# Patient Record
Sex: Male | Born: 1935
Health system: Southern US, Community
[De-identification: ages and names within clinical notes are randomized; demographics above are authoritative.]

## PROBLEM LIST (undated history)

## (undated) DIAGNOSIS — K219 Gastro-esophageal reflux disease without esophagitis: Secondary | ICD-10-CM

## (undated) DIAGNOSIS — C189 Malignant neoplasm of colon, unspecified: Secondary | ICD-10-CM

## (undated) DIAGNOSIS — C61 Malignant neoplasm of prostate: Secondary | ICD-10-CM

## (undated) DIAGNOSIS — E538 Deficiency of other specified B group vitamins: Secondary | ICD-10-CM

## (undated) DIAGNOSIS — I739 Peripheral vascular disease, unspecified: Secondary | ICD-10-CM

## (undated) DIAGNOSIS — I10 Essential (primary) hypertension: Secondary | ICD-10-CM

## (undated) DIAGNOSIS — E119 Type 2 diabetes mellitus without complications: Secondary | ICD-10-CM

## (undated) DIAGNOSIS — G4733 Obstructive sleep apnea (adult) (pediatric): Secondary | ICD-10-CM

## (undated) DIAGNOSIS — I2510 Atherosclerotic heart disease of native coronary artery without angina pectoris: Secondary | ICD-10-CM

## (undated) DIAGNOSIS — E782 Mixed hyperlipidemia: Secondary | ICD-10-CM

## (undated) HISTORY — PX: PROSTATECTOMY: SHX69

## (undated) HISTORY — DX: Essential (primary) hypertension: I10

## (undated) HISTORY — PX: VASECTOMY: SHX75

## (undated) HISTORY — PX: OTHER SURGICAL HISTORY: SHX169

## (undated) HISTORY — PX: TONSILLECTOMY: SHX5217

## (undated) HISTORY — PX: KNEE ARTHROSCOPY: SUR90

## (undated) HISTORY — PX: SHOULDER SURGERY: SHX246

## (undated) HISTORY — DX: Obstructive sleep apnea (adult) (pediatric): G47.33

## (undated) HISTORY — DX: Peripheral vascular disease, unspecified: I73.9

## (undated) HISTORY — DX: Mixed hyperlipidemia: E78.2

## (undated) HISTORY — PX: BACK SURGERY: SHX140

## (undated) HISTORY — DX: Atherosclerotic heart disease of native coronary artery without angina pectoris: I25.10

## (undated) HISTORY — PX: COLON SURGERY: SHX602

## (undated) HISTORY — DX: Type 2 diabetes mellitus without complications: E11.9

## (undated) HISTORY — DX: Malignant neoplasm of prostate: C61

## (undated) HISTORY — DX: Deficiency of other specified B group vitamins: E53.8

## (undated) HISTORY — PX: KNEE SURGERY: SHX244

---

## 1998-04-26 ENCOUNTER — Inpatient Hospital Stay (HOSPITAL_COMMUNITY): Admission: AD | Admit: 1998-04-26 | Discharge: 1998-04-29 | Payer: Self-pay | Admitting: Cardiology

## 1998-04-29 ENCOUNTER — Ambulatory Visit (HOSPITAL_COMMUNITY): Admission: RE | Admit: 1998-04-29 | Discharge: 1998-04-29 | Payer: Self-pay | Admitting: Neurosurgery

## 1998-06-20 ENCOUNTER — Ambulatory Visit (HOSPITAL_COMMUNITY): Admission: RE | Admit: 1998-06-20 | Discharge: 1998-06-20 | Payer: Self-pay | Admitting: Neurosurgery

## 1998-11-01 ENCOUNTER — Inpatient Hospital Stay (HOSPITAL_COMMUNITY): Admission: RE | Admit: 1998-11-01 | Discharge: 1998-11-02 | Payer: Self-pay | Admitting: Neurosurgery

## 2000-04-05 ENCOUNTER — Ambulatory Visit (HOSPITAL_COMMUNITY): Admission: RE | Admit: 2000-04-05 | Discharge: 2000-04-05 | Payer: Self-pay

## 2000-04-21 ENCOUNTER — Ambulatory Visit (HOSPITAL_COMMUNITY): Admission: RE | Admit: 2000-04-21 | Discharge: 2000-04-21 | Payer: Self-pay | Admitting: Oral Surgery

## 2001-03-19 ENCOUNTER — Encounter: Payer: Self-pay | Admitting: Family Medicine

## 2001-03-19 ENCOUNTER — Ambulatory Visit (HOSPITAL_COMMUNITY): Admission: RE | Admit: 2001-03-19 | Discharge: 2001-03-19 | Payer: Self-pay | Admitting: Family Medicine

## 2002-06-28 ENCOUNTER — Encounter: Payer: Self-pay | Admitting: Family Medicine

## 2002-06-28 ENCOUNTER — Ambulatory Visit (HOSPITAL_COMMUNITY): Admission: RE | Admit: 2002-06-28 | Discharge: 2002-06-28 | Payer: Self-pay | Admitting: Family Medicine

## 2002-07-02 ENCOUNTER — Ambulatory Visit (HOSPITAL_COMMUNITY): Admission: RE | Admit: 2002-07-02 | Discharge: 2002-07-02 | Payer: Self-pay | Admitting: Family Medicine

## 2002-07-02 ENCOUNTER — Encounter: Payer: Self-pay | Admitting: Family Medicine

## 2002-10-19 ENCOUNTER — Encounter: Payer: Self-pay | Admitting: Orthopedic Surgery

## 2002-10-25 ENCOUNTER — Inpatient Hospital Stay (HOSPITAL_COMMUNITY): Admission: RE | Admit: 2002-10-25 | Discharge: 2002-11-01 | Payer: Self-pay | Admitting: Orthopedic Surgery

## 2002-11-29 ENCOUNTER — Encounter (HOSPITAL_COMMUNITY): Admission: RE | Admit: 2002-11-29 | Discharge: 2002-12-29 | Payer: Self-pay | Admitting: Orthopedic Surgery

## 2002-12-31 ENCOUNTER — Encounter (HOSPITAL_COMMUNITY): Admission: RE | Admit: 2002-12-31 | Discharge: 2003-01-21 | Payer: Self-pay | Admitting: Orthopedic Surgery

## 2003-01-24 ENCOUNTER — Encounter (HOSPITAL_COMMUNITY): Admission: RE | Admit: 2003-01-24 | Discharge: 2003-02-23 | Payer: Self-pay | Admitting: Orthopedic Surgery

## 2003-03-31 ENCOUNTER — Ambulatory Visit (HOSPITAL_COMMUNITY): Admission: RE | Admit: 2003-03-31 | Discharge: 2003-03-31 | Payer: Self-pay | Admitting: Family Medicine

## 2003-03-31 ENCOUNTER — Encounter: Payer: Self-pay | Admitting: Family Medicine

## 2003-04-20 ENCOUNTER — Encounter: Payer: Self-pay | Admitting: Urology

## 2003-04-20 ENCOUNTER — Ambulatory Visit (HOSPITAL_COMMUNITY): Admission: RE | Admit: 2003-04-20 | Discharge: 2003-04-20 | Payer: Self-pay | Admitting: Internal Medicine

## 2003-06-13 ENCOUNTER — Ambulatory Visit (HOSPITAL_COMMUNITY): Admission: RE | Admit: 2003-06-13 | Discharge: 2003-06-13 | Payer: Self-pay | Admitting: Neurosurgery

## 2003-06-15 ENCOUNTER — Ambulatory Visit (HOSPITAL_COMMUNITY): Admission: RE | Admit: 2003-06-15 | Discharge: 2003-06-15 | Payer: Self-pay | Admitting: Neurosurgery

## 2003-07-28 ENCOUNTER — Ambulatory Visit (HOSPITAL_COMMUNITY): Admission: RE | Admit: 2003-07-28 | Discharge: 2003-07-28 | Payer: Self-pay | Admitting: Internal Medicine

## 2003-10-31 ENCOUNTER — Encounter: Admission: RE | Admit: 2003-10-31 | Discharge: 2003-10-31 | Payer: Self-pay | Admitting: Orthopedic Surgery

## 2003-11-02 ENCOUNTER — Ambulatory Visit (HOSPITAL_BASED_OUTPATIENT_CLINIC_OR_DEPARTMENT_OTHER): Admission: RE | Admit: 2003-11-02 | Discharge: 2003-11-02 | Payer: Self-pay | Admitting: Orthopedic Surgery

## 2003-11-02 ENCOUNTER — Ambulatory Visit (HOSPITAL_COMMUNITY): Admission: RE | Admit: 2003-11-02 | Discharge: 2003-11-02 | Payer: Self-pay | Admitting: Orthopedic Surgery

## 2003-11-23 ENCOUNTER — Encounter: Admission: RE | Admit: 2003-11-23 | Discharge: 2003-11-23 | Payer: Self-pay | Admitting: Orthopedic Surgery

## 2003-12-01 ENCOUNTER — Encounter (HOSPITAL_COMMUNITY): Admission: RE | Admit: 2003-12-01 | Discharge: 2003-12-31 | Payer: Self-pay | Admitting: Orthopedic Surgery

## 2004-01-03 ENCOUNTER — Encounter (HOSPITAL_COMMUNITY): Admission: RE | Admit: 2004-01-03 | Discharge: 2004-02-02 | Payer: Self-pay | Admitting: Orthopedic Surgery

## 2004-01-17 ENCOUNTER — Ambulatory Visit (HOSPITAL_COMMUNITY): Admission: RE | Admit: 2004-01-17 | Discharge: 2004-01-17 | Payer: Self-pay | Admitting: Family Medicine

## 2004-12-18 ENCOUNTER — Ambulatory Visit (HOSPITAL_COMMUNITY): Admission: RE | Admit: 2004-12-18 | Discharge: 2004-12-18 | Payer: Self-pay | Admitting: Otolaryngology

## 2005-03-21 ENCOUNTER — Encounter: Admission: RE | Admit: 2005-03-21 | Discharge: 2005-03-21 | Payer: Self-pay | Admitting: Orthopedic Surgery

## 2005-04-02 ENCOUNTER — Encounter: Admission: RE | Admit: 2005-04-02 | Discharge: 2005-04-02 | Payer: Self-pay | Admitting: Orthopedic Surgery

## 2005-04-03 ENCOUNTER — Ambulatory Visit (HOSPITAL_BASED_OUTPATIENT_CLINIC_OR_DEPARTMENT_OTHER): Admission: RE | Admit: 2005-04-03 | Discharge: 2005-04-03 | Payer: Self-pay | Admitting: Orthopedic Surgery

## 2005-04-03 ENCOUNTER — Ambulatory Visit (HOSPITAL_COMMUNITY): Admission: RE | Admit: 2005-04-03 | Discharge: 2005-04-03 | Payer: Self-pay | Admitting: Orthopedic Surgery

## 2005-04-11 ENCOUNTER — Encounter (HOSPITAL_COMMUNITY): Admission: RE | Admit: 2005-04-11 | Discharge: 2005-05-16 | Payer: Self-pay | Admitting: Orthopedic Surgery

## 2005-05-16 ENCOUNTER — Ambulatory Visit: Payer: Self-pay | Admitting: Internal Medicine

## 2005-05-22 ENCOUNTER — Ambulatory Visit: Payer: Self-pay | Admitting: Internal Medicine

## 2005-07-05 ENCOUNTER — Ambulatory Visit: Payer: Self-pay | Admitting: Internal Medicine

## 2005-10-23 ENCOUNTER — Ambulatory Visit: Payer: Self-pay | Admitting: Internal Medicine

## 2005-11-13 ENCOUNTER — Ambulatory Visit (HOSPITAL_COMMUNITY): Admission: RE | Admit: 2005-11-13 | Discharge: 2005-11-13 | Payer: Self-pay | Admitting: Internal Medicine

## 2005-11-13 ENCOUNTER — Ambulatory Visit: Payer: Self-pay | Admitting: Internal Medicine

## 2006-01-20 ENCOUNTER — Ambulatory Visit: Payer: Self-pay | Admitting: Internal Medicine

## 2006-03-13 ENCOUNTER — Ambulatory Visit: Payer: Self-pay | Admitting: Internal Medicine

## 2006-04-08 ENCOUNTER — Ambulatory Visit (HOSPITAL_COMMUNITY): Admission: RE | Admit: 2006-04-08 | Discharge: 2006-04-08 | Payer: Self-pay | Admitting: Urology

## 2006-06-27 ENCOUNTER — Encounter: Admission: RE | Admit: 2006-06-27 | Discharge: 2006-06-27 | Payer: Self-pay | Admitting: Otolaryngology

## 2006-07-09 ENCOUNTER — Ambulatory Visit: Payer: Self-pay | Admitting: Internal Medicine

## 2006-10-01 ENCOUNTER — Ambulatory Visit: Payer: Self-pay | Admitting: Internal Medicine

## 2006-10-17 ENCOUNTER — Ambulatory Visit: Payer: Self-pay | Admitting: Internal Medicine

## 2006-11-13 ENCOUNTER — Ambulatory Visit: Payer: Self-pay | Admitting: Cardiology

## 2006-11-20 ENCOUNTER — Ambulatory Visit: Payer: Self-pay

## 2006-11-20 LAB — CONVERTED CEMR LAB
AST: 19 units/L (ref 0–37)
Albumin: 3.9 g/dL (ref 3.5–5.2)
HDL: 48.9 mg/dL (ref >39.0)
Total Bilirubin: 0.8 mg/dL (ref 0.3–1.2)
Triglycerides: 98 mg/dL (ref 0–149)

## 2006-12-11 ENCOUNTER — Encounter: Payer: Self-pay | Admitting: Cardiology

## 2006-12-11 ENCOUNTER — Ambulatory Visit: Payer: Self-pay | Admitting: Cardiology

## 2006-12-11 ENCOUNTER — Ambulatory Visit: Payer: Self-pay

## 2006-12-11 LAB — CONVERTED CEMR LAB
Basophils Absolute: 0 10*3/uL (ref 0.0–0.1)
Eosinophils Absolute: 0.1 10*3/uL (ref 0.0–0.6)
Eosinophils Relative: 2.5 % (ref 0.0–5.0)
MCV: 90.4 fL (ref 78.0–100.0)
Platelets: 145 10*3/uL — ABNORMAL LOW (ref 150–400)
RBC: 4.52 M/uL (ref 4.22–5.81)
WBC: 5.4 10*3/uL (ref 4.5–10.5)

## 2006-12-25 ENCOUNTER — Ambulatory Visit: Payer: Self-pay | Admitting: Cardiology

## 2006-12-25 LAB — CONVERTED CEMR LAB
BUN: 24 mg/dL — ABNORMAL HIGH (ref 6–23)
Basophils Absolute: 0 10*3/uL (ref 0.0–0.1)
Eosinophils Absolute: 0.1 10*3/uL (ref 0.0–0.6)
GFR calc Af Amer: 95 mL/min
GFR calc non Af Amer: 79 mL/min
HCT: 43.2 % (ref 39.0–52.0)
Hemoglobin: 14.6 g/dL (ref 13.0–17.0)
INR: 1 (ref 0.9–2.0)
MCHC: 33.9 g/dL (ref 30.0–36.0)
MCV: 90 fL (ref 78.0–100.0)
Monocytes Absolute: 0.5 10*3/uL (ref 0.2–0.7)
Monocytes Relative: 6.5 % (ref 3.0–11.0)
Neutro Abs: 4 10*3/uL (ref 1.4–7.7)
Neutrophils Relative %: 54.7 % (ref 43.0–77.0)
Potassium: 4.6 meq/L (ref 3.5–5.1)
Sodium: 140 meq/L (ref 135–145)
aPTT: 32.7 s (ref 26.5–36.5)

## 2006-12-31 ENCOUNTER — Inpatient Hospital Stay (HOSPITAL_BASED_OUTPATIENT_CLINIC_OR_DEPARTMENT_OTHER): Admission: RE | Admit: 2006-12-31 | Discharge: 2006-12-31 | Payer: Self-pay | Admitting: Cardiology

## 2006-12-31 ENCOUNTER — Ambulatory Visit: Payer: Self-pay | Admitting: Cardiology

## 2007-01-01 ENCOUNTER — Ambulatory Visit: Payer: Self-pay | Admitting: Internal Medicine

## 2007-01-19 ENCOUNTER — Ambulatory Visit: Payer: Self-pay | Admitting: Cardiology

## 2007-02-06 ENCOUNTER — Ambulatory Visit: Payer: Self-pay | Admitting: Cardiology

## 2007-02-06 ENCOUNTER — Ambulatory Visit: Payer: Self-pay

## 2007-02-06 LAB — CONVERTED CEMR LAB
BUN: 14 mg/dL (ref 6–23)
Creatinine, Ser: 0.9 mg/dL (ref 0.4–1.5)
GFR calc Af Amer: 108 mL/min
GFR calc non Af Amer: 89 mL/min
Potassium: 4.1 meq/L (ref 3.5–5.1)
Sodium: 144 meq/L (ref 135–145)

## 2007-03-10 ENCOUNTER — Ambulatory Visit: Payer: Self-pay | Admitting: Cardiology

## 2007-03-10 LAB — CONVERTED CEMR LAB
BUN: 19 mg/dL (ref 6–23)
CO2: 29 meq/L (ref 19–32)
GFR calc Af Amer: 123 mL/min
GFR calc non Af Amer: 102 mL/min
Potassium: 4.6 meq/L (ref 3.5–5.1)

## 2007-06-27 ENCOUNTER — Ambulatory Visit (HOSPITAL_COMMUNITY): Admission: RE | Admit: 2007-06-27 | Discharge: 2007-06-27 | Payer: Self-pay | Admitting: Urology

## 2007-08-24 ENCOUNTER — Ambulatory Visit: Payer: Self-pay | Admitting: Cardiology

## 2007-08-24 LAB — CONVERTED CEMR LAB
Calcium: 9.5 mg/dL (ref 8.4–10.5)
GFR calc Af Amer: 85 mL/min
GFR calc non Af Amer: 71 mL/min
Glucose, Bld: 105 mg/dL — ABNORMAL HIGH (ref 70–99)

## 2007-08-25 ENCOUNTER — Ambulatory Visit: Payer: Self-pay | Admitting: Cardiology

## 2007-09-19 ENCOUNTER — Emergency Department (HOSPITAL_COMMUNITY): Admission: EM | Admit: 2007-09-19 | Discharge: 2007-09-19 | Payer: Self-pay | Admitting: Emergency Medicine

## 2007-09-23 ENCOUNTER — Ambulatory Visit (HOSPITAL_COMMUNITY): Admission: RE | Admit: 2007-09-23 | Discharge: 2007-09-23 | Payer: Self-pay | Admitting: Neurosurgery

## 2008-02-03 ENCOUNTER — Ambulatory Visit (HOSPITAL_COMMUNITY): Admission: RE | Admit: 2008-02-03 | Discharge: 2008-02-03 | Payer: Self-pay

## 2008-02-03 ENCOUNTER — Ambulatory Visit (HOSPITAL_COMMUNITY): Admission: RE | Admit: 2008-02-03 | Discharge: 2008-02-03 | Payer: Self-pay | Admitting: Internal Medicine

## 2008-02-20 ENCOUNTER — Emergency Department (HOSPITAL_COMMUNITY): Admission: EM | Admit: 2008-02-20 | Discharge: 2008-02-20 | Payer: Self-pay | Admitting: Emergency Medicine

## 2008-08-12 ENCOUNTER — Ambulatory Visit (HOSPITAL_COMMUNITY): Admission: RE | Admit: 2008-08-12 | Discharge: 2008-08-12 | Payer: Self-pay | Admitting: Internal Medicine

## 2008-08-16 ENCOUNTER — Ambulatory Visit (HOSPITAL_COMMUNITY): Admission: RE | Admit: 2008-08-16 | Discharge: 2008-08-16 | Payer: Self-pay | Admitting: Urology

## 2008-11-25 ENCOUNTER — Ambulatory Visit: Payer: Self-pay | Admitting: Cardiology

## 2009-02-18 DIAGNOSIS — I2510 Atherosclerotic heart disease of native coronary artery without angina pectoris: Secondary | ICD-10-CM | POA: Insufficient documentation

## 2009-02-18 DIAGNOSIS — C61 Malignant neoplasm of prostate: Secondary | ICD-10-CM

## 2009-02-20 ENCOUNTER — Emergency Department (HOSPITAL_COMMUNITY): Admission: EM | Admit: 2009-02-20 | Discharge: 2009-02-20 | Payer: Self-pay | Admitting: Emergency Medicine

## 2009-05-08 ENCOUNTER — Encounter: Payer: Self-pay | Admitting: Cardiology

## 2009-07-12 ENCOUNTER — Encounter: Admission: RE | Admit: 2009-07-12 | Discharge: 2009-07-12 | Payer: Self-pay | Admitting: Orthopedic Surgery

## 2009-11-23 ENCOUNTER — Ambulatory Visit: Payer: Self-pay | Admitting: Cardiology

## 2009-11-23 ENCOUNTER — Encounter: Payer: Self-pay | Admitting: Cardiology

## 2009-11-23 DIAGNOSIS — E782 Mixed hyperlipidemia: Secondary | ICD-10-CM | POA: Insufficient documentation

## 2009-11-23 DIAGNOSIS — I10 Essential (primary) hypertension: Secondary | ICD-10-CM | POA: Insufficient documentation

## 2009-12-01 ENCOUNTER — Ambulatory Visit: Payer: Self-pay | Admitting: Cardiology

## 2009-12-01 ENCOUNTER — Inpatient Hospital Stay (HOSPITAL_BASED_OUTPATIENT_CLINIC_OR_DEPARTMENT_OTHER): Admission: RE | Admit: 2009-12-01 | Discharge: 2009-12-01 | Payer: Self-pay | Admitting: Cardiology

## 2010-01-01 ENCOUNTER — Ambulatory Visit: Payer: Self-pay | Admitting: Cardiology

## 2010-01-01 DIAGNOSIS — R0602 Shortness of breath: Secondary | ICD-10-CM

## 2010-01-02 ENCOUNTER — Ambulatory Visit: Payer: Self-pay | Admitting: Internal Medicine

## 2010-01-04 ENCOUNTER — Encounter: Payer: Self-pay | Admitting: Cardiology

## 2010-01-11 ENCOUNTER — Ambulatory Visit: Payer: Self-pay

## 2010-01-11 ENCOUNTER — Encounter: Payer: Self-pay | Admitting: Cardiology

## 2010-01-11 ENCOUNTER — Ambulatory Visit (HOSPITAL_COMMUNITY): Admission: RE | Admit: 2010-01-11 | Discharge: 2010-01-11 | Payer: Self-pay | Admitting: Cardiology

## 2010-01-11 ENCOUNTER — Ambulatory Visit: Payer: Self-pay | Admitting: Internal Medicine

## 2010-01-18 ENCOUNTER — Telehealth: Payer: Self-pay | Admitting: Cardiology

## 2010-01-23 ENCOUNTER — Ambulatory Visit: Payer: Self-pay | Admitting: Internal Medicine

## 2010-01-23 DIAGNOSIS — K219 Gastro-esophageal reflux disease without esophagitis: Secondary | ICD-10-CM | POA: Insufficient documentation

## 2010-01-23 DIAGNOSIS — I10 Essential (primary) hypertension: Secondary | ICD-10-CM | POA: Insufficient documentation

## 2010-01-23 DIAGNOSIS — J309 Allergic rhinitis, unspecified: Secondary | ICD-10-CM | POA: Insufficient documentation

## 2010-01-25 ENCOUNTER — Encounter: Payer: Self-pay | Admitting: Cardiology

## 2010-01-25 ENCOUNTER — Ambulatory Visit: Payer: Self-pay

## 2010-01-29 ENCOUNTER — Ambulatory Visit: Payer: Self-pay | Admitting: Cardiology

## 2010-01-29 DIAGNOSIS — R079 Chest pain, unspecified: Secondary | ICD-10-CM | POA: Insufficient documentation

## 2010-01-31 ENCOUNTER — Telehealth: Payer: Self-pay | Admitting: Internal Medicine

## 2010-02-01 ENCOUNTER — Ambulatory Visit: Payer: Self-pay | Admitting: Internal Medicine

## 2010-02-08 ENCOUNTER — Telehealth: Payer: Self-pay | Admitting: Cardiology

## 2010-03-19 ENCOUNTER — Encounter: Payer: Self-pay | Admitting: Cardiology

## 2010-04-06 ENCOUNTER — Ambulatory Visit: Payer: Self-pay | Admitting: Cardiology

## 2010-04-06 DIAGNOSIS — R5381 Other malaise: Secondary | ICD-10-CM | POA: Insufficient documentation

## 2010-04-06 DIAGNOSIS — R5383 Other fatigue: Secondary | ICD-10-CM

## 2010-05-14 ENCOUNTER — Ambulatory Visit: Payer: Self-pay | Admitting: Cardiology

## 2010-05-22 ENCOUNTER — Telehealth: Payer: Self-pay | Admitting: Cardiology

## 2010-05-22 LAB — CONVERTED CEMR LAB
ALT: 15 units/L (ref 0–53)
Cholesterol: 195 mg/dL (ref 0–200)
HDL: 55.4 mg/dL (ref >39.00)
Total Protein: 6.3 g/dL (ref 6.0–8.3)
Triglycerides: 115 mg/dL (ref 0.0–149.0)
VLDL: 23 mg/dL (ref 0.0–40.0)

## 2010-07-05 ENCOUNTER — Ambulatory Visit: Payer: Self-pay | Admitting: Cardiology

## 2010-09-07 ENCOUNTER — Ambulatory Visit: Payer: Self-pay | Admitting: Cardiology

## 2010-09-07 ENCOUNTER — Encounter: Payer: Self-pay | Admitting: Cardiology

## 2010-09-12 ENCOUNTER — Ambulatory Visit (HOSPITAL_COMMUNITY)
Admission: RE | Admit: 2010-09-12 | Discharge: 2010-09-12 | Payer: Self-pay | Source: Home / Self Care | Attending: Cardiology | Admitting: Cardiology

## 2010-10-15 ENCOUNTER — Ambulatory Visit
Admission: RE | Admit: 2010-10-15 | Discharge: 2010-10-15 | Payer: Self-pay | Source: Home / Self Care | Attending: Cardiology | Admitting: Cardiology

## 2010-10-21 LAB — CONVERTED CEMR LAB
Basophils Relative: 0.4 % (ref 0.0–3.0)
Calcium: 9.3 mg/dL (ref 8.4–10.5)
Chloride: 108 meq/L (ref 96–112)
Creatinine, Ser: 1.1 mg/dL (ref 0.4–1.5)
Eosinophils Relative: 3.2 % (ref 0.0–5.0)
INR: 0.9 (ref 0.8–1.0)
Lymphocytes Relative: 30.7 % (ref 12.0–46.0)
Neutrophils Relative %: 57.9 % (ref 43.0–77.0)
RBC: 4.15 M/uL — ABNORMAL LOW (ref 4.22–5.81)
Sodium: 142 meq/L (ref 135–145)
WBC: 5.4 10*3/uL (ref 4.5–10.5)

## 2010-10-23 NOTE — Assessment & Plan Note (Signed)
Summary: SIX MIN WALK-PULM STRESS TEST  Nurse Visit   Vital Signs:  Patient profile:   76 year old male Pulse rate:   71 / minute BP sitting:   116 / 70  Medications Prior to Update: 1)  Lantus Solostar 100 Unit/ml Soln (Insulin Glargine) .... 50 Units Once Daily 2)  Novolog 100 Unit/ml Soln (Insulin Aspart) .... 20 Units Daily 3)  Vytorin 10-40 Mg Tabs (Ezetimibe-Simvastatin) .... Take One Tablet At Bedtime 4)  Citalopram Hydrobromide 20 Mg Tabs (Citalopram Hydrobromide) .... Once A Day 5)  Lialda 1.2 Gm Tbec (Mesalamine) .... 2 Tablets Two Times A Day 6)  Lisinopril 40 Mg Tabs (Lisinopril) .... Take One Tablet By Mouth Daily 7)  Dexilant 60 Mg Cpdr (Dexlansoprazole) .... Take 1 Capsule By Mouth Once A Day 8)  Diazepam 5 Mg Tabs (Diazepam) .... As Needed 9)  Metformin Hcl 1000 Mg Tabs (Metformin Hcl) .... Take 1 Tablet By Mouth Two Times A Day 10)  Amlodipine Besylate 5 Mg Tabs (Amlodipine Besylate) .... Take One Tablet By Mouth Daily 11)  Ipratropium Bromide 0.06 % Soln (Ipratropium Bromide) .[name].. 1-2 Puffs Each Nostril Three Times A Day As Needed  Allergies: 1)  ! Codeine 2)  ! Zocor 3)  ! Lipitor (Atorvastatin) 4)  ! Crestor (Rosuvastatin Calcium) 5)  ! * Bee Stings  Orders Added: 1)  Pulmonary Stress (6 min walk) [94620]   Six Minute Walk Test Medications taken before test(dose and time): 1)  Lantus Solostar 100 Unit/ml Soln (Insulin Glargine) .... 50 Units Once Daily 2)  Novolog 100 Unit/ml Soln (Insulin Aspart) .... 20 Units Daily 4)  Citalopram Hydrobromide 20 Mg Tabs (Citalopram Hydrobromide) .... Once A Day 5)  Lialda 1.2 Gm Tbec (Mesalamine) .... 2 Tablets Two Times A Day 6)  Lisinopril 40 Mg Tabs (Lisinopril) .... Take One Tablet By Mouth Daily 7)  Dexilant 60 Mg Cpdr (Dexlansoprazole) .... Take 1 Capsule By Mouth Once A Day 8)  Diazepam 5 Mg Tabs (Diazepam) .... As Needed 9)  Metformin Hcl 1000 Mg Tabs (Metformin Hcl) .... Take 1 Tablet By Mouth Two Times A  Day 10)  Amlodipine Besylate 5 Mg Tabs (Amlodipine Besylate) .... Take One Tablet By Mouth Daily 11)  Ipratropium Bromide 0.06 % Soln (Ipratropium Bromide) .[name].. 1-2 Puffs Each Nostril Three Times A Day As Needed Pt took these meds at 7am today Supplemental oxygen during the test: No  Lap counter(place a tick mark inside a square for each lap completed) lap 1 complete  lap 2 complete   lap 3 complete   lap 4 complete  lap 5 complete  lap 6 complete  lap 7 complete    Baseline  BP sitting: 116/ 70 Heart rate: 71 Dyspnea ( Borg scale) 4 Fatigue (Borg scale) 0 SPO2 96  End Of Test  BP sitting: 118/ 72 Heart rate: 82 Dyspnea ( Borg scale) 7 Fatigue (Borg scale) 4 SPO2 95  2 Minutes post  BP sitting: 118/ 70 Heart rate: 72 SPO2 95  Stopped or paused before six minutes? No Other symptoms at end of exercise: Dizziness  Interpretation: Number of laps  7 X 48 meters =   336 meters+ final partial lap: 34 meters =    370 meters   Total distance walked in six minutes: 370 meters  Tech ID: Tivis Ringer, CNA (Feb 01, 2010 3:08 PM) Tech Comments Pt completed test w/ 0 rest breaks and 1 complaint: dizziness which resolved by 2 mins post.

## 2010-10-23 NOTE — Letter (Signed)
Summary: Cardiac Catheterization Instructions- [facility]  [facility] [employer], Main Office  [address]   [place], [place] [address]   Phone: [phone]  Fax: [phone]     11/23/2009 MRN: [id number]  [name] [address] [place], [place]  [address]  Dear [name],   You are scheduled for a Cardiac Catheterization on Friday December 01, 2009 with Dr. Riley Kill.  Please arrive to the 1st floor of the [facility] at [facility] at 6:30 am on the day of your procedure. Please do not arrive before 6:30 a.m. Call the Heart and Vascular Center at [phone] if you are unable to make your appointmnet. The Code to get into the parking garage under the building is 9000. Take the elevators to the 1st floor. You must have someone to drive you home. Someone must be with you for the first 24 hours after you arrive home. Please wear clothes that are easy to get on and off and wear slip-on shoes. Do not eat or drink after midnight except water with your medications that morning. Bring all your medications and current insurance cards with you.  _X__ DO NOT take these medications before your procedure:  Take half of your evening dose of Lantus the night before procedure, Do NOT take Metformin the night before, morning of or 48 hours after procedure.  _X__ You may take ALL of your medications with water that morning.   The usual length of stay after your procedure is 2 to 3 hours. This can vary.  If you have any questions, please call the office at the number listed above.   Julieta Gutting, RN, BSN

## 2010-10-23 NOTE — Cardiovascular Report (Signed)
Summary: Cath/Percutaneous Intervention [facility]  Cath/Percutaneous Intervention [facility]   Imported By: Roderic Ovens 12/07/2009 11:16:47  _____________________________________________________________________  External Attachment:    Type:   Image     Comment:   External Document

## 2010-10-23 NOTE — Progress Notes (Signed)
Summary: Anticipate endoscopy Dr Corbin Ade  Phone Note Call from Patient Call back at [phone]   Caller: Patient Call For: young Reason for Call: Talk to Nurse Summary of Call: pt needs someone to call him back about setting up endoscopy Initial call taken by: Zigmund Gottron,  Jan 31, 2010 2:45 PM  Follow-up for Phone Call        Left message with pt's wife for pt to call office back.  Raymondo Band RN  Jan 31, 2010 3:19 PM   The patient says he is sch to see Dr. Laural Golden in [place] on 03/02/2010 for endoscopy and says Dr. Lia Foyer said it was okay to proceed with Endo. The pt is sch for 6MW on 02/01/2010 @ 3pm and to see CDY after @ 3;30pm. I will forward to CDY so he aware. Follow-up by: Francesca Jewett CMA,  Jan 31, 2010 4:30 PM  Additional Follow-up for Phone Call Additional follow up Details #1::        noted Additional Follow-up by: Deneise Lever MD,  Jan 31, 2010 4:59 PM

## 2010-10-23 NOTE — Miscellaneous (Signed)
Summary: [facility] Physician Order/Treatment Plan   [facility] Physician Order/Treatment Plan   Imported By: Roderic Ovens 04/19/2010 15:51:08  _____________________________________________________________________  External Attachment:    Type:   Image     Comment:   External Document

## 2010-10-23 NOTE — Assessment & Plan Note (Signed)
Summary: F1M   Visit Type:  Follow-up Primary Provider:  Margo Aye  CC:  Post-cath- Some chest pains.  History of Present Illness: Patient is not having as much chest pain, but still quite short of breath.  He notices some wheezing but not much coughing.  This is new from one year earlier.  No swelling in legs, and he does not lay around quite a bit.  The SOB has occurred despite being active.  Current Medications (verified): 1)  Lantus Solostar 100 Unit/ml Soln (Insulin Glargine) .... 50 Units Once Daily 2)  Novolog 100 Unit/ml Soln (Insulin Aspart) .... 20 Units Daily 3)  Vytorin 10-40 Mg Tabs (Ezetimibe-Simvastatin) .... Take One Tablet At Bedtime 4)  Lexapro 10 Mg Tabs (Escitalopram Oxalate) .... Take One Tablet Daily 5)  Lialda 1.2 Gm Tbec (Mesalamine) .... 2 Tablets Two Times A Day 6)  Avapro 300 Mg Tabs (Irbesartan) .... Take 1 Tablet By Mouth Once A Day 7)  Dexilant 60 Mg Cpdr (Dexlansoprazole) .... Take 1 Capsule By Mouth Once A Day 8)  Diazepam 5 Mg Tabs (Diazepam) .... As Needed 9)  Metformin Hcl 1000 Mg Tabs (Metformin Hcl) .... Take 1 Tablet By Mouth Two Times A Day 10)  Amlodipine Besylate 5 Mg Tabs (Amlodipine Besylate) .... Take One Tablet By Mouth Daily  Allergies: 1)  ! Codeine 2)  ! Zocor 3)  ! Lipitor (Atorvastatin) 4)  ! Crestor (Rosuvastatin Calcium) 5)  ! * Bee Stings  Vital Signs:  Patient profile:   76 year old male Height:      71 inches Weight:      236.50 pounds BMI:     33.10 Pulse rate:   70 / minute Pulse rhythm:   regular Resp:     18 per minute BP sitting:   117 / 70  (left arm) Cuff size:   large  Vitals Entered By: Vikki Ports (January 01, 2010 8:49 AM)  Physical Exam  General:  Well developed, well nourished, in no acute distress. Head:  normocephalic and atraumatic Eyes:  PERRLA/EOM intact; conjunctiva and lids normal. Lungs:  Clear bilaterally to auscultation and percussion. Heart:  PMI non displaced.  Normal S1 and S2.  No murmur or  rub. Abdomen:  Bowel sounds positive; abdomen soft and non-tender without masses, organomegaly, or hernias noted. No hepatosplenomegaly. Extremities:  No clubbing or cyanosis. Neurologic:  Alert and oriented x 3.   EKG  Procedure date:  01/01/2010  Findings:      NSR. WNL.  CXR  Procedure date:  11/23/2009  Findings:       CHEST - 2 VIEW    Comparison: 02/20/2009.    Findings: Heart size is normal.  The aorta is tortuous.  There is   no heart failure.  The lungs are clear without infiltrate or   effusion.  Postop surgical changes in the cervical spine.    There is no mass or adenopathy.    IMPRESSION:   No active cardiopulmonary disease.    Read By:  Camelia Phenes,  M.D.   Released By:  Camelia Phenes,  M.D.   Cardiac Cath  Procedure date:  12/01/2009  Findings:       CONCLUSION:   1. Well-preserved left ventricular function.   2. Mild myocardial bridging in the midportion of the left anterior       descending artery.   3. Mild luminal irregularities as noted above.   4. Anomalous circumflex coronary artery.  DISPOSITION:  Based upon the angiographic findings, continued medical   therapy would be warranted at the present time.  He has had chest pain   in the past and it has not clearly been related to the findings.  At   present, I would be inclined to continue to treat him medically.  There   is very little that would warrant revascularization at this point.      Impression & Recommendations:  Problem # 1:  DYSPNEA (ICD-786.05)  Not enough findings on cath to explain.  Relatively recent in onset.  CXR ok,  D-dimer nl.  Not obvious pulm issues on exam.  Will get PFTs, and repeat 2D echo.  If ok, then he can have endo. His updated medication list for this problem includes:    Avapro 300 Mg Tabs (Irbesartan) .[name]... Take 1 tablet by mouth once a day    Amlodipine Besylate 5 Mg Tabs (Amlodipine besylate) .[name]... Take one tablet by mouth  daily  Orders: Echocardiogram (Echo) Pulmonary Function Test (PFT) EKG w/ Interpretation (93000)  Problem # 2:  HYPERCHOLESTEROLEMIA  IIA (ICD-272.0) remains on treatment His updated medication list for this problem includes:    Vytorin 10-40 Mg Tabs (Ezetimibe-simvastatin) .[name]... Take one tablet at bedtime  Problem # 3:  HYPERTENSION, BENIGN (ICD-401.1) controlled His updated medication list for this problem includes:    Avapro 300 Mg Tabs (Irbesartan) .[name]... Take 1 tablet by mouth once a day    Amlodipine Besylate 5 Mg Tabs (Amlodipine besylate) .[name]... Take one tablet by mouth daily  His updated medication list for this problem includes:    Avapro 300 Mg Tabs (Irbesartan) .[name]... Take 1 tablet by mouth once a day    Amlodipine Besylate 5 Mg Tabs (Amlodipine besylate) .[name]... Take one tablet by mouth daily  Patient Instructions: 1)  Your physician recommends that you schedule a follow-up appointment in: 3-4 weeks 2)  Your physician recommends that you continue on your current medications as directed. Please refer to the Current Medication list given to you today. 3)  Your physician has requested that you have an echocardiogram.  Echocardiography is a painless test that uses sound waves to create images of your heart. It provides your doctor with information about the size and shape of your heart and how well your heart's chambers and valves are working.  This procedure takes approximately one hour. There are no restrictions for this procedure. 4)  Your physician has recommended that you have a pulmonary function test.  Pulmonary Function Tests are a group of tests that measure how well air moves in and out of your lungs.

## 2010-10-23 NOTE — Miscellaneous (Signed)
Summary: Orders Update pft charges  Clinical Lists Changes  Orders: Added new Service order of Carbon Monoxide diffusing w/capacity (94720) - Signed Added new Service order of Lung Volumes (94240) - Signed Added new Service order of Spirometry (Pre & Post) (94060) - Signed 

## 2010-10-23 NOTE — Assessment & Plan Note (Signed)
Summary: f1y  Medications Added LIALDA 1.2 GM TBEC (MESALAMINE) 2 tablets two times a day AVAPRO 300 MG TABS (IRBESARTAN) Take 1 tablet by mouth once a day METANX 3-35-2 MG TABS (L-METHYLFOLATE-B6-B12) Take 1 tablet by mouth two times a day as directed DEXILANT 60 MG CPDR (DEXLANSOPRAZOLE) Take 1 capsule by mouth once a day DIAZEPAM 5 MG TABS (DIAZEPAM) as needed METFORMIN HCL 1000 MG TABS (METFORMIN HCL) Take 1 tablet by mouth two times a day AMLODIPINE BESYLATE 5 MG TABS (AMLODIPINE BESYLATE) Take one tablet by mouth daily        Visit Type:  1 year follow up  CC:  Sob .  History of Present Illness: Patient been having some chest pain recently.  This started about six weeks ago.  He had been trying to do some work, but his wind gives out. The symptoms have been getting worse.  If he gets out and exercises, it comes on routinely.  He has shortness of breath at baseline.  Pt has not had swelling in feet.  He layed around after shoulder surgery, Nov, 2010.  There is a definite change without question.  He has gained some weight.  Current Medications (verified): 1)  Lantus Solostar 100 Unit/ml Soln (Insulin Glargine) .... 50 Units Once Daily 2)  Novolog 100 Unit/ml Soln (Insulin Aspart) .... 20 Units Daily 3)  Vytorin 10-40 Mg Tabs (Ezetimibe-Simvastatin) .... Take One Tablet At Bedtime 4)  Lexapro 10 Mg Tabs (Escitalopram Oxalate) .... Take One Tablet Daily 5)  Lialda 1.2 Gm Tbec (Mesalamine) .... 2 Tablets Two Times A Day 6)  Avapro 300 Mg Tabs (Irbesartan) .... Take 1 Tablet By Mouth Once A Day 7)  Metanx 3-35-2 Mg Tabs (L-Methylfolate-B6-B12) .... Take 1 Tablet By Mouth Two Times A Day As Directed 8)  Dexilant 60 Mg Cpdr (Dexlansoprazole) .... Take 1 Capsule By Mouth Once A Day 9)  Diazepam 5 Mg Tabs (Diazepam) .... As Needed 10)  Metformin Hcl 1000 Mg Tabs (Metformin Hcl) .... Take 1 Tablet By Mouth Two Times A Day 11)  Amlodipine Besylate 5 Mg Tabs (Amlodipine Besylate) .... Take  One Tablet By Mouth Daily  Allergies: 1)  ! Codeine 2)  ! Zocor 3)  ! Lipitor (Atorvastatin) 4)  ! Crestor (Rosuvastatin Calcium)  Past History:  Past Medical History: Last updated: 02/18/2009 Diabetes Coronary artery disease Hypercholesterolemia Peripheral vascular disease Prostate Cancer  Family History: Last updated: 02/18/2009 Positive for a brother diagnosed with colon cancer at age 79. Later succumbed to this two years later.  Social History: Last updated: 02/18/2009 Retired -[employer] Married  Tobacco Use - Former. -Quit many years ago--10ppy history Alcohol Use - no Drug Use - no  Past Surgical History: Tonsillectomy Lumbar and C spine surgery--1966/2000 Vasectomy R knee surgery-1969 Shoulder surgery  2010  Vital Signs:  Patient profile:   76 year old male Height:      71 inches Weight:      235 pounds BMI:     32.89 Pulse rate:   65 / minute Pulse rhythm:   regular Resp:     18 per minute BP sitting:   100 / 74  (left arm) Cuff size:   large  Vitals Entered By: Vikki Ports (November 23, 2009 11:00 AM)  Physical Exam  General:  Well developed, well nourished, in no acute distress. Head:  normocephalic and atraumatic Eyes:  PERRLA/EOM intact; conjunctiva and lids normal. Lungs:  Clear bilaterally to auscultation and percussion. Heart:  Non-displaced PMI,  chest non-tender; regular rate and rhythm, S1, S2 without murmurs, rubs or gallops. Carotid upstroke normal, no bruit. Normal abdominal aortic size, no bruits. Abdomen:  Bowel sounds positive; abdomen soft and non-tender without masses, organomegaly, or hernias noted. No hepatosplenomegaly. Pulses:  pulses normal in all 4 extremities Extremities:  No clubbing or cyanosis. Neurologic:  Alert and oriented x 3.   EKG  Procedure date:  11/23/2009  Findings:      NSR. WNL.    Cardiac Cath  Procedure date:  12/31/2006  Findings:       CONCLUSION:  1. Mild narrowing in the mid left  anterior descending artery which      could represent either segmental plaquing or perhaps even a      myocardial bridge.  2. Large normal ramus intermedius vessel.  3. 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  4. Smooth right coronary artery.   DISPOSITION:  Despite the patient's longstanding diabetes, the coronary  vasculature is really quite smooth.  I would doubt that any of his  symptoms are explained by the current coronary findings.  We will talk  with his primary care physician about current status.  Impression & Recommendations:  Problem # 1:  CAD (ICD-414.00)  Has mild CAD by prior cath.  Symptoms sound worrisome for progressive angina.  Would favor cath with history of IDDM, and classic pattern, with known mild findings.  Risks discussed. His updated medication list for this problem includes:    Amlodipine Besylate 5 Mg Tabs (Amlodipine besylate) .[name]... Take one tablet by mouth daily  Orders: Cardiac Catheterization (Cardiac Cath) TLB-BMP (Basic Metabolic Panel-BMET) (80048-METABOL) TLB-CBC Platelet - w/Differential (85025-CBCD) TLB-PT (Protime) (85610-PTP) EKG w/ Interpretation (93000) T-2 View CXR (71020TC)  Problem # 2:  PROSTATE CANCER (ICD-185) Has followup in [place].  Problem # 3:  HYPERTENSION, BENIGN (ICD-401.1)  Variale, but controlled.  On appropriate meds. His updated medication list for this problem includes:    Avapro 300 Mg Tabs (Irbesartan) .[name]... Take 1 tablet by mouth once a day    Amlodipine Besylate 5 Mg Tabs (Amlodipine besylate) .[name]... Take one tablet by mouth daily  Orders: Cardiac Catheterization (Cardiac Cath) TLB-BMP (Basic Metabolic Panel-BMET) (80048-METABOL) TLB-CBC Platelet - w/Differential (85025-CBCD) TLB-PT (Protime) (85610-PTP) EKG w/ Interpretation (93000) T-2 View CXR (71020TC)  Problem # 4:  HYPERCHOLESTEROLEMIA  IIA (ICD-272.0)  Followed by Dr. Margo Aye. His updated medication list  for this problem includes:    Vytorin 10-40 Mg Tabs (Ezetimibe-simvastatin) .[name]... Take one tablet at bedtime  His updated medication list for this problem includes:    Vytorin 10-40 Mg Tabs (Ezetimibe-simvastatin) .[name]... Take one tablet at bedtime  Orders: Cardiac Catheterization (Cardiac Cath) TLB-BMP (Basic Metabolic Panel-BMET) (80048-METABOL) TLB-CBC Platelet - w/Differential (85025-CBCD) TLB-PT (Protime) (85610-PTP) EKG w/ Interpretation (93000) T-2 View CXR (71020TC)  Patient Instructions: 1)  Your physician recommends that you have lab work today: BMP, CBC, PT/INR 2)  Your physician has requested that you have a cardiac catheterization.  Cardiac catheterization is used to diagnose and/or treat various heart conditions. Doctors may recommend this procedure for a number of different reasons. The most common reason is to evaluate chest pain. Chest pain can be a symptom of coronary artery disease (CAD), and cardiac catheterization can show whether plaque is narrowing or blocking your heart's arteries. This procedure is also used to evaluate the valves, as well as measure the blood flow and oxygen levels in different parts of your heart.  For  further information please visit [email or web address]  Please follow instruction sheet, as given. 3)  Your physician recommends that you schedule a follow-up appointment in: 1 MONTH 4)  Your physician recommends that you continue on your current medications as directed. Please refer to the Current Medication list given to you today. 5)  Chest X-ray obtained in the office today.

## 2010-10-23 NOTE — Assessment & Plan Note (Signed)
Summary: ROV/APC   Primary Provider/Referring Provider:  Dwana Melena  CC:  follow up visit-Review test..  History of Present Illness:  History of Present Illness: Jan 23, 2010- 74 yoM referred courtesy of Dr Riley Kill because of dyspnea, questioning relation to abnormal PFT. 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 He complains that he gives out too easily with exertion, described as breathlessness, weakness, "legs won't go". He dates this back 6-8 months and denied prior lung diease. He then admitted he was tried with a discus and a metered inhaler several years ago, implying respiratory symptoms at that time. The medicines didn't help then. Now notes dyspnea walking, bending, gardening. some wheeze. Little cough or phlegm. Legs swell and he is pending dopplers of leg veins and considering VQ scan to exclude chronic PE. Watery rhinorhea with meals is very bothersome. Hoarseness and known reflux- pending EGD aqnd dilatation when cleared by Dr Riley Kill.  He denies strangle or choke with meals or in sleep. Little change in dyspnea wfrom day to day. Denies chest pain, palpitation, productive cough, nodes, fever, rash, hot joints, bleeding or claudication. No hx liver disease, MI, anemia, asthma, or pneumonia. TESTS: ECHO- 01/11/10- Nl LV, mild diastolic dysf, PFT-01/02/10- mild reversible small airway obst w/ resp to BD 32%. FEV! 3.09/ 104%; FEV1/FVC 0.70. DLCO 79, correcting for volume. ? blunted Peak Flow on FV Loops. CXR-11/23/09 NAD, nl heart and lungs CBC- Hgb 13 CT 12/08- neg for PE during chest pain w/u.  Feb 01, 2010- Dyspnea, Vasomotor rhinitis Has felt better with rain/ humidity . Dyspnea still about same.. Ipratropium spay does help rhinorhea. 6 MWT- did well, 96%, 95%, 95% 370 m. He finds hills and stairs harder.  He does have exercycle and stair climber doing 10-15 minutes. Leg vein  dopplers neg. For endo Dr Karilyn Cota- [phone].   Current Medications (verified): 1)  Lantus Solostar 100 Unit/ml Soln (Insulin Glargine) .... 50 Units Once Daily 2)  Novolog 100 Unit/ml Soln (Insulin Aspart) .... 20 Units Daily 3)  Vytorin 10-40 Mg Tabs (Ezetimibe-Simvastatin) .... Take One Tablet At Bedtime 4)  Citalopram Hydrobromide 20 Mg Tabs (Citalopram Hydrobromide) .... Once A Day 5)  Lialda 1.2 Gm Tbec (Mesalamine) .... 2 Tablets Two Times A Day 6)  Lisinopril 40 Mg Tabs (Lisinopril) .... Take One Tablet By Mouth Daily 7)  Dexilant 60 Mg Cpdr (Dexlansoprazole) .... Take 1 Capsule By Mouth Once A Day 8)  Diazepam 5 Mg Tabs (Diazepam) .... As Needed 9)  Metformin Hcl 1000 Mg Tabs (Metformin Hcl) .... Take 1 Tablet By Mouth Two Times A Day 10)  Amlodipine Besylate 5 Mg Tabs (Amlodipine Besylate) .... Take One Tablet By Mouth Daily 11)  Ipratropium Bromide 0.06 % Soln (Ipratropium Bromide) .[name].. 1-2 Puffs Each Nostril Three Times A Day As Needed  Allergies (verified): 1)  ! Codeine 2)  ! Zocor 3)  ! Lipitor (Atorvastatin) 4)  ! Crestor (Rosuvastatin Calcium) 5)  ! * Bee Stings  Review of Systems      See HPI       The patient complains of shortness of breath with activity.  The patient denies shortness of breath at rest, productive cough, non-productive cough, coughing up blood, chest pain, irregular heartbeats, acid heartburn, indigestion, loss of appetite, weight change, abdominal pain, difficulty swallowing, sore throat, tooth/dental problems, headaches, nasal congestion/difficulty breathing through nose, and sneezing.  Vital Signs:  Patient profile:   75 year old male Height:      71 inches Weight:      227 pounds BMI:     31.77 O2 Sat:      96 % on Room air Pulse rate:   68 / minute BP sitting:   116 / 62  (left arm) Cuff size:   large  Vitals Entered By: Reynaldo Minium CMA (Feb 01, 2010 3:43 PM)  O2 Flow:  Room air  Physical Exam  Additional Exam:  General:  A/Ox3; pleasant and cooperative, NAD, overweight, calm SKIN: no rash, lesions,  NODES: no lymphadenopathy HEENT: [place]/AT, EOM- WNL, Conjuctivae- clear, PERRLA, TM-WNL, Nose- clear, Throat- hoarse without stridor, red pharynx.Mallampati  II NECK: Supple w/ fair ROM, JVD- none, normal carotid impulses w/o bruits Thyroid- , voice a little raspy, with no stridor. CHEST: Clear to P&A, shallow c/w body habitus HEART: RRR, no m/g/r heard ABDOMEN: Soft and nl; ZOX:WRUE, nl pulses, no edema  NEURO: Grossly intact to observation      Impression & Recommendations:  Problem # 1:  DYSPNEA (ICD-786.05)  I can't show a pulmonary deficit other than his body weight limitation Consider formal CardioPulmonary Stress Test if indicated.  Weight loss would help. I will ask Dr Karilyn Cota to look at vocal cords in passing during upper endoscopy. There was suggestion of blunting of peak flows on the Flow-Volume Loops of his PFT. Not enough to be definite, but in context of his dyspnea complaint, laryngeal stenosis maight cause this.  Other Orders: Est. Patient Level III (45409)  Patient Instructions: 1)  Please schedule a follow-up appointment as needed. 2)  OK to go ahead with planned GI studies.

## 2010-10-23 NOTE — Assessment & Plan Note (Signed)
Summary: ROV   Visit Type:  Follow-up Primary Provider:  Dwana Melena  CC:  No cardiac complaints today.  History of Present Illness: Patient is pleased with his cholesterol.  Hgb A1c 6.7%.  BP are well controlled on ACE inhibition.  Patient had vasectomy, and a sling placed, and a pump.  His breathing is about the same.  Weight is up some.  They called him regarding exercise rehab, but the cost was an issue as was his arm.  I stressed the importance of remaining active to him today.  Still has mod SOB, and some chest discomfort.  O2 sat is 97% today.    Current Medications (verified): 1)  Lantus Solostar 100 Unit/ml Soln (Insulin Glargine) .... 50 Units Once Daily 2)  Novolog 100 Unit/ml Soln (Insulin Aspart) .... 20 Units Daily 3)  Crestor 40 Mg Tabs (Rosuvastatin Calcium) .... Take One Tablet By Mouth Daily. 4)  Citalopram Hydrobromide 20 Mg Tabs (Citalopram Hydrobromide) .... Once A Day 5)  Lialda 1.2 Gm Tbec (Mesalamine) .... 2 Tablets Two Times A Day 6)  Lisinopril 40 Mg Tabs (Lisinopril) .... Take One Tablet By Mouth Daily 7)  Dexilant 60 Mg Cpdr (Dexlansoprazole) .... Take 1 Capsule By Mouth Once A Day 8)  Diazepam 5 Mg Tabs (Diazepam) .... As Needed 9)  Metformin Hcl 1000 Mg Tabs (Metformin Hcl) .... Take 1 Tablet By Mouth Two Times A Day 10)  Amlodipine Besylate 5 Mg Tabs (Amlodipine Besylate) .... Take One Tablet By Mouth Daily 11)  Ipratropium Bromide 0.06 % Soln (Ipratropium Bromide) .[name].. 1-2 Puffs Each Nostril Three Times A Day As Needed  Allergies: 1)  ! Codeine 2)  ! Zocor 3)  ! Lipitor (Atorvastatin) 4)  ! * Bee Stings  Vital Signs:  Patient profile:   76 year old male Height:      71 inches Weight:      228.75 pounds BMI:     32.02 O2 Sat:      95 % on Room air Pulse rate:   73 / minute Pulse rhythm:   regular Resp:     18 per minute BP sitting:   120 / 80  (left arm) Cuff size:   large  Vitals Entered By: Vikki Ports (July 05, 2010 9:38 AM)  O2 Flow:   Room air  Serial Vital Signs/Assessments:                                PEF    PreRx  PostRx Time      O2 Sat  O2 Type     L/min  L/min  L/min   By 11:19 AM  92  %   Room air                          Julieta Gutting, RN, BSN  Comments: 11:19 AM pt walked about 200 feet and his O2 sat dropped to 92%--once the pt stopped walking O2 sat returned to 95% Julieta Gutting, RN, BSN  July 05, 2010 11:19 AM By: Julieta Gutting, RN, BSN    Physical Exam  General:  Well developed, well nourished, in no acute distress. Head:  normocephalic and atraumatic Eyes:  PERRLA/EOM intact; conjunctiva and lids normal. Neck:  JVP flat.  No carotid bruits.  Lungs:  Clear bilaterally to auscultation and percussion. Heart:  PMI non displaced.   Normal S1 and S2.  No murmur or rub.   Pulses:  pulses normal in all 4 extremities Extremities:  No clubbing or cyanosis. Neurologic:  Alert and oriented x 3.   EKG  Procedure date:  07/05/2010  Findings:      NSR.  Low voltage QRS.  NO acute changes.   Impression & Recommendations:  Problem # 1:  DYSPNEA (ICD-786.05) Has some shortness of breath.  Could not do rehab because of three separate operations.  O2 sat today is 95, 92 with exertion.  We discussed CPX testing, and also VQ scan.  Index of suspicion, and Wells score are low for PE, and RV not large on echo.  Prior CT done.  He had uro procedure, so cannot do anything at present.  Will get him to exercise between now and two months, and recommended CPX and poss VQ scan to exclude chronic PE  (doubt).  Dr. Maple Hudson note again reviewed.    Problem # 2:  HYPERCHOLESTEROLEMIA  IIA (ICD-272.0) at or below target at this point.  Complimented on results, and DM parameters are in good order.  His updated medication list for this problem includes:    Crestor 40 Mg Tabs (Rosuvastatin calcium) .[name]... Take one tablet by mouth daily.  Other Orders: EKG w/ Interpretation (93000)  Patient Instructions: 1)  Your  physician recommends that you schedule a follow-up appointment in: 2 MONTHS.  2)  Your physician recommends that you continue on your current medications as directed. Please refer to the Current Medication list given to you today.

## 2010-10-23 NOTE — Progress Notes (Signed)
Summary: sore throat, cough, -meds lisinopril   Phone Note Call from Patient Call back at Home Phone [phone]   Refills Requested: Medication #1:  by mouth daily Caller: Patient Reason for Call: Talk to Nurse Complaint: Cough/Sore throat Summary of Call: c/o meds making his throat sore, coughing. -LISINOPRIL 40 MG TABS Take one tablet  Initial call taken by: Lorne Skeens,  Feb 08, 2010 8:25 AM  Follow-up for Phone Call        8:58 am  left message for pt to call back to discuss  Sander Nephew, RN   Spoke with pt has had a dry sore throat and cough which Dr Riley Kill told him was coming from his Lisinopril.  Pt would like another medication to take that is inexpensive that can take the place of his Lisinopril.  Pt aware Dr Riley Kill not in the office.  Pt states another MD started the medication and I suggested pt call his office but pt would rather wait to hear back from Lauren and/or Dr Riley Kill.  Pt has taken Lisinopril today, he is advised to hold it untill he hears back from our office.  He states understanding. Follow-up by: Charolotte Capuchin, RN,  Feb 08, 2010 9:38 AM  Additional Follow-up for Phone Call Additional follow up Details #1::        I spoke with the pt and made him aware that Dr Riley Kill has not reviewed his chart at this time.  The pt was previously taking Avapro and this was changed to Lisinopril due to insurance.  The pt cannot afford the cost of Avapro.  I will send the pt's Rx to [facility] once Dr Riley Kill orders a new medication.  Additional Follow-up by: Julieta Gutting, RN, BSN,  Feb 12, 2010 12:50 PM    Additional Follow-up for Phone Call Additional follow up Details #2::    returning call, may leave info on voicemail Migdalia Dk  Feb 12, 2010 3:19 PM    I spoke with the pt and he will call Dr Scharlene Gloss office to make further changes in his BP medications.  The pt said Dr Margo Aye had given him samples of Losartan.  The pt also said that Dr Riley Kill  had left him a message about possibly needing to have a chest x-ray.  The pt will follow-up with Dr Scharlene Gloss office for x-ray if needed. The pt did have a chest x-ray 11/23/09 in our office and this was okay.  I left a message for the pt that he did have a chest x-ray in March and this was normal.  Follow-up by: Julieta Gutting, RN, BSN,  Feb 13, 2010 10:18 AM

## 2010-10-23 NOTE — Assessment & Plan Note (Signed)
Summary: abn pft/klw   Primary Provider/Referring Provider:  Dwana Melena  CC:  Pulmonary consult per Dr. Victorio Palm PFT.[name]  History of Present Illness: Jan 23, 2010- 74 yoM referred courtesy of Dr Riley Kill because of dyspnea, questioning relation to abnormal PFT. 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 He complains that he gives out too easily with exertion, described as breathlessness, weakness, "legs won't go". He dates this back 6-8 months and denied prior lung diease. He then admitted he was tried with a discus and a metered inhaler several years ago, implying respiratory symptoms at that time. The medicines didn't help then. Now notes dyspnea walking, bending, gardening. some wheeze. Little cough or phlegm. Legs swell and he is pending dopplers of leg veins and considering VQ scan to exclude chronic PE. Watery rhinorhea with meals is very bothersome. Hoarseness and known reflux- pending EGD aqnd dilatation when cleared by Dr Riley Kill.  He denies strangle or choke with meals or in sleep. Little change in dyspnea wfrom day to day. Denies chest pain, palpitation, productive cough, nodes, fever, rash, hot joints, bleeding or claudication. No hx liver disease, MI, anemia, asthma, or pneumonia. TESTS: ECHO- 01/11/10- Nl LV, mild diastolic dysf, PFT-01/02/10- mild reversible small airway obst w/ resp to BD 32%. FEV! 3.09/ 104%; FEV1/FVC 0.70. DLCO 79, correcting for volume. ? blunted Peak Flow on FV Loops. CXR-11/23/09 NAD, nl heart and lungs CBC- Hgb 13 CT 12/08- neg for PE during chest pain w/u.  Current Medications (verified): 1)  Lantus Solostar 100 Unit/ml Soln (Insulin Glargine) .... 50 Units Once Daily 2)  Novolog 100 Unit/ml Soln (Insulin Aspart) .... 20 Units Daily 3)  Vytorin 10-40 Mg Tabs (Ezetimibe-Simvastatin) .... Take One Tablet At Bedtime 4)  Lexapro 10 Mg Tabs (Escitalopram  Oxalate) .... Take One Tablet Daily 5)  Lialda 1.2 Gm Tbec (Mesalamine) .... 2 Tablets Two Times A Day 6)  Avapro 300 Mg Tabs (Irbesartan) .... Take 1 Tablet By Mouth Once A Day 7)  Dexilant 60 Mg Cpdr (Dexlansoprazole) .... Take 1 Capsule By Mouth Once A Day 8)  Diazepam 5 Mg Tabs (Diazepam) .... As Needed 9)  Metformin Hcl 1000 Mg Tabs (Metformin Hcl) .... Take 1 Tablet By Mouth Two Times A Day 10)  Amlodipine Besylate 5 Mg Tabs (Amlodipine Besylate) .... Take One Tablet By Mouth Daily  Allergies (verified): 1)  ! Codeine 2)  ! Zocor 3)  ! Lipitor (Atorvastatin) 4)  ! Crestor (Rosuvastatin Calcium) 5)  ! * Bee Stings  Past History:  Family History: Last updated: 01/23/2010 Positive for a brother diagnosed with colon cancer at age 62. Succumbed to this two years later.  Social History: Last updated: 01/23/2010 Retired -[employer], [employer]. [employer]                                                                                  Auct[employer] Married  Tobacco Use - Former. -Quit many years ago--10ppy history Alcohol Use - no Drug Use - no  Risk Factors: Smoking Status: quit (02/18/2009)  Past Medical History: Diabetes Coronary artery disease  Hypercholesterolemia Peripheral vascular disease Prostate Cancer Hypertension  Past Surgical History: Tonsillectomy Lumbar and C spine surgery--1966/2000 Vasectomy R knee surgery-1969 Shoulder surgery  2010 Prostatectomy  Family History: Positive for a brother diagnosed with colon cancer at age 38. Succumbed to this two years later.  Social History: Retired -[employer], [employer]. [employer]                                                                                  Auct[employer]ion Married  Tobacco Use - Former. -Quit many years ago--10ppy history Alcohol Use - no Drug Use - no  Review of Systems       The patient complains of shortness of breath with activity, shortness of breath at rest,  chest pain, acid heartburn, difficulty swallowing, sore throat, headaches, sneezing, itching, hand/feet swelling, and joint stiffness or pain.  The patient denies productive cough, non-productive cough, coughing up blood, irregular heartbeats, indigestion, loss of appetite, weight change, abdominal pain, tooth/dental problems, nasal congestion/difficulty breathing through nose, ear ache, anxiety, depression, rash, change in color of mucus, and fever.    Vital Signs:  Patient profile:   76 year old male Height:      71 inches Weight:      234.25 pounds BMI:     32.79 O2 Sat:      96 % on Room air Pulse rate:   66 / minute BP sitting:   114 / 72  (left arm) Cuff size:   large  Vitals Entered By: Reynaldo Minium CMA (Jan 23, 2010 4:44 PM)  O2 Flow:  Room air  Physical Exam  Additional Exam:  General: A/Ox3; pleasant and cooperative, NAD, overweight, calm SKIN: no rash, lesions NODES: no lymphadenopathy HEENT: [place]/AT, EOM- WNL, Conjuctivae- clear, PERRLA, TM-WNL, Nose- clear, Throat- hoarse without stridor, red pharynx.Mallampati  II NECK: Supple w/ fair ROM, JVD- none, normal carotid impulses w/o bruits Thyroid- normal to palpation CHEST: Clear to P&A, cough after deep breath HEART: RRR, no m/g/r heard ABDOMEN: Soft and nl; nml bowel sounds; no organomegaly or masses noted UVO:ZDGU, nl pulses, no edema  NEURO: Grossly intact to observation      Impression & Recommendations:  Problem # 1:  DYSPNEA (ICD-786.05)  Multifactorial dyspnea: Overweight and deconditioned. Hoarse, with some blunting of peak flow on Flow Volume Loop which can indicate upper airway obstruction- possibly from GERD ?? Agree with doppler of leg veins and consider V/Q to look for chronic PE. We have not documented oxygen desaturation associated with dyspnea. Will get 6 MWT. Consider CPXT. Reduced DLCO without visible interstitial or structural lung parenchymal disease, or reduced cardiac output. Obesity and  occult PE might do this. There is a modest small airway response to bronchodilator. We can reassess his response to bronchodilator.  Problem # 2:  VASOMOTOR RHINITIS (ICD-477.9)  He describes meal - related watery rhinorhea as a real quality of life concern.This is probably from vasomotor rhinitis. try ipratropium   His updated medication list for this problem includes:    Ipratropium Bromide 0.06 % Soln (Ipratropium bromide) .[name]... 1-2 puffs each nostril three times a day as needed  Problem # 3:  GERD (ICD-530.81)  Red  throat and hoarseness reflect severe LPR. He needs to f/u as soon as possible with his GI doctor and proceed with EGD. Hopefully there will be opportunity to assess vocal cords during introduction of scope. His updated medication list for this problem includes:    Dexilant 60 Mg Cpdr (Dexlansoprazole) .[name]... Take 1 capsule by mouth once a day  Orders: Consultation Level IV (16109)  Medications Added to Medication List This Visit: 1)  Ipratropium Bromide 0.06 % Soln (Ipratropium bromide) .[name].. 1-2 puffs each nostril three times a day as needed  Patient Instructions: 1)  Please schedule a follow-up appointment in 3 weeks. 2)  Schedule 6 MWT 3)  Script for ipratropium nasal spray 4)  I will review the test results we already have. 5)  Definitely get the endoscopy test done as soon as released by Dr Riley Kill. Prescriptions: IPRATROPIUM BROMIDE 0.06 % SOLN (IPRATROPIUM BROMIDE) 1-2 puffs each nostril three times a day as needed  #1 vial x prn   Entered and Authorized by:   Waymon Budge MD   Signed by:   Waymon Budge MD on 01/23/2010   Method used:   Print then Give to Patient   RxID:   [id number]

## 2010-10-23 NOTE — Assessment & Plan Note (Signed)
Summary: 1 month rov   Visit Type:  1 month follow up Primary Provider:  Dwana [name]  CC:  Chest pains .  History of Present Illness: Continues to have some chest discomfort, but is also tender on exam.  he notes it. His syptoms are largely unchanged from several days ago when he was last seen.    Current Medications (verified): 1)  Lantus Solostar 100 Unit/ml Soln (Insulin Glargine) .... 50 Units Once Daily 2)  Novolog 100 Unit/ml Soln (Insulin Aspart) .... 20 Units Daily 3)  Vytorin 10-40 Mg Tabs (Ezetimibe-Simvastatin) .... Take One Tablet At Bedtime 4)  Citalopram Hydrobromide 20 Mg Tabs (Citalopram Hydrobromide) .... Once A Day 5)  Lialda 1.2 Gm Tbec (Mesalamine) .... 2 Tablets Two Times A Day 6)  Lisinopril 40 Mg Tabs (Lisinopril) .... Take One Tablet By Mouth Daily 7)  Dexilant 60 Mg Cpdr (Dexlansoprazole) .... Take 1 Capsule By Mouth Once A Day 8)  Diazepam 5 Mg Tabs (Diazepam) .... As Needed 9)  Metformin Hcl 1000 Mg Tabs (Metformin Hcl) .... Take 1 Tablet By Mouth Two Times A Day 10)  Amlodipine Besylate 5 Mg Tabs (Amlodipine Besylate) .... Take One Tablet By Mouth Daily 11)  Ipratropium Bromide 0.06 % Soln (Ipratropium Bromide) .[name].. 1-2 Puffs Each Nostril Three Times A Day As Needed  Allergies: 1)  ! Codeine 2)  ! Zocor 3)  ! Lipitor (Atorvastatin) 4)  ! Crestor (Rosuvastatin Calcium) 5)  ! * Bee Stings  Vital Signs:  Patient profile:   76 year old male Height:      71 inches Weight:      228.25 pounds BMI:     31.95 Pulse rate:   68 / minute Pulse rhythm:   regular Resp:     18 per minute BP sitting:   92 / 66  (left arm) Cuff size:   large  Vitals Entered By: Vikki Ports (Jan 29, 2010 2:56 PM)  Physical Exam  General:  Well developed, well nourished, in no acute distress. Head:  normocephalic and atraumatic Eyes:  PERRLA/EOM intact; conjunctiva and lids normal. Chest Wall:  mildly tender to touch. Lungs:  Clear bilaterally to auscultation and  percussion. Heart:  PMI non displaced.  Normal S1 and S2.  Without murmur.   CXR  Procedure date:  11/23/2009  Findings:       CHEST - 2 VIEW    Comparison: 02/20/2009.    Findings: Heart size is normal.  The aorta is tortuous.  There is   no heart failure.  The lungs are clear without infiltrate or   effusion.  Postop surgical changes in the cervical spine.    There is no mass or adenopathy.    IMPRESSION:   No active cardiopulmonary disease.    Read By:  Camelia Phenes,  M.D.   Released By:  Camelia Phenes,  M.D.  EKG  Procedure date:  01/29/2010  Findings:      NSR. WNL.    Cardiac Cath  Procedure date:  12/01/2009  Findings:       CONCLUSION:   1. Well-preserved left ventricular function.   2. Mild myocardial bridging in the midportion of the left anterior       descending artery.   3. Mild luminal irregularities as noted above.   4. Anomalous circumflex coronary artery.      DISPOSITION:  Based upon the angiographic findings, continued medical   therapy would be warranted at the present time.  He  has had chest pain   in the past and it has not clearly been related to the findings.  At   present, I would be inclined to continue to treat him medically.  There   is very little that would warrant revascularization at this point.      Echocardiogram  Procedure date:  01/11/2010  Findings:        Study Conclusions    - Left ventricle: Wall thickness was at the upper limits of normal.     Systolic function was normal. The estimated ejection fraction was     in the range of 55% to 60%. Features are consistent with a     pseudonormal left ventricular filling pattern, with concomitant     abnormal relaxation and increased filling pressure (grade 2     diastolic dysfunction).   - Aortic valve: Trivial regurgitation.   - Left atrium: The atrium was mildly dilated.   - Right atrium: The atrium was mildly dilated.  Impression & Recommendations:  Problem #  1:  CHEST PAIN-UNSPECIFIED (ICD-786.50) Exact cause remains unclear to me.  He also has some dyspnea.  Complete workup as noted.  Coronary anatomy has been defined, echo with significant abnl except diastolic findings.  CXR results as described.  Has seen Dr. Maple Hudson in pulmonary as well.  RV is not enlarged.  Will continue to monitor closely.  Little to suggest PE as source.    Problem # 2:  HYPERTENSION (ICD-401.9) BP lower today but has been well controlled. His updated medication list for this problem includes:    Lisinopril 40 Mg Tabs (Lisinopril) .[name]... Take one tablet by mouth daily    Amlodipine Besylate 5 Mg Tabs (Amlodipine besylate) .[name]... Take one tablet by mouth daily  Orders: EKG w/ Interpretation (93000)  Problem # 3:  HYPERCHOLESTEROLEMIA  IIA (ICD-272.0) currently on excellent medical therapy. His updated medication list for this problem includes:    Vytorin 10-40 Mg Tabs (Ezetimibe-simvastatin) .[name]... Take one tablet at bedtime  Orders: EKG w/ Interpretation (93000)  Problem # 4:  GERD (ICD-530.81) Seeing Dr. Karilyn Cota at present.   His updated medication list for this problem includes:    Dexilant 60 Mg Cpdr (Dexlansoprazole) .[name]... Take 1 capsule by mouth once a day  Patient Instructions: 1)  Your physician recommends that you schedule a follow-up appointment in: 2 MONTHS 2)  Your physician recommends that you continue on your current medications as directed. Please refer to the Current Medication list given to you today.

## 2010-10-23 NOTE — Progress Notes (Signed)
Summary: Lab Results  Phone Note Call from Patient Call back at Home Phone [phone] Call back at [phone]   Caller: Patient Summary of Call: PT REQUEST CALL Initial call taken by: Judie Grieve,  May 22, 2010 4:47 PM  Follow-up for Phone Call        I spoke with the pt and made him aware of lab results.  The pt will increase Crestor to 40mg  daily and recheck labs at his appt on 07/05/10.  New Rx sent into the pharmacy. The pt is aware that he will need FASTING bloodwork at his appt.  Follow-up by: Julieta Gutting, RN, BSN,  May 22, 2010 5:48 PM    New/Updated Medications: CRESTOR 40 MG TABS (ROSUVASTATIN CALCIUM) Take one tablet by mouth daily. Prescriptions: CRESTOR 40 MG TABS (ROSUVASTATIN CALCIUM) Take one tablet by mouth daily.  #30 x 6   Entered by:   Julieta Gutting, RN, BSN   Authorized by:   Ronaldo Miyamoto, MD, [facility]   Signed by:   Julieta Gutting, RN, BSN on 05/22/2010   Method used:   Electronically to        [facility] (retail)       [address]       [place], [place]  [address]       Ph: [phone]       Fax: [phone]   RxID:   [id number]

## 2010-10-23 NOTE — Assessment & Plan Note (Signed)
Summary: f59m   Visit Type:  2 months follow up Primary Provider:  Dwana Melena  CC:  Sob.  History of Present Illness: Still has some shortness of breath, but has little energy.  We talked rehab, as a test and he seems agreeable.  We also talkeb about his A1c which was 7 .  No chest pain at present. Has been seen in pulmonary and they recommended weight loss.  Wants to have wrist surgery.  Scheduled for next Friday.    Current Medications (verified): 1)  Lantus Solostar 100 Unit/ml Soln (Insulin Glargine) .... 50 Units Once Daily 2)  Novolog 100 Unit/ml Soln (Insulin Aspart) .... 20 Units Daily 3)  Vytorin 10-40 Mg Tabs (Ezetimibe-Simvastatin) .... Take One Tablet At Bedtime 4)  Citalopram Hydrobromide 20 Mg Tabs (Citalopram Hydrobromide) .... Once A Day 5)  Lialda 1.2 Gm Tbec (Mesalamine) .... 2 Tablets Two Times A Day 6)  Lisinopril 40 Mg Tabs (Lisinopril) .... Take One Tablet By Mouth Daily 7)  Dexilant 60 Mg Cpdr (Dexlansoprazole) .... Take 1 Capsule By Mouth Once A Day 8)  Diazepam 5 Mg Tabs (Diazepam) .... As Needed 9)  Metformin Hcl 1000 Mg Tabs (Metformin Hcl) .... Take 1 Tablet By Mouth Two Times A Day 10)  Amlodipine Besylate 5 Mg Tabs (Amlodipine Besylate) .... Take One Tablet By Mouth Daily 11)  Ipratropium Bromide 0.06 % Soln (Ipratropium Bromide) .[name].. 1-2 Puffs Each Nostril Three Times A Day As Needed  Allergies: 1)  ! Codeine 2)  ! Zocor 3)  ! Lipitor (Atorvastatin) 4)  ! Crestor (Rosuvastatin Calcium) 5)  ! * Bee Stings  Past History:  Past Medical History: Last updated: 01/23/2010 Diabetes Coronary artery disease Hypercholesterolemia Peripheral vascular disease Prostate Cancer Hypertension  Past Surgical History: Last updated: 01/23/2010 Tonsillectomy Lumbar and C spine surgery--1966/2000 Vasectomy R knee surgery-1969 Shoulder surgery  2010 Prostatectomy  Family History: Last updated: 01/23/2010 Positive for a brother diagnosed with colon cancer at  age 62. Succumbed to this two years later.  Social History: Last updated: 01/23/2010 Retired -[employer], [employer]. [employer]                                                                                  Auctiion Married  Tobacco Use - Former. -Quit many years ago--10ppy history Alcohol Use - no Drug Use - no  Vital Signs:  Patient profile:   76 year old male Height:      71 inches Weight:      226.50 pounds BMI:     31.70 Pulse rate:   64 / minute Pulse rhythm:   regular Resp:     18 per minute BP sitting:   118 / 80  (left arm) Cuff size:   large  Vitals Entered By: Vikki Ports (April 06, 2010 8:47 AM)  Physical Exam  General:  Well developed, well nourished, in no acute distress. Head:  normocephalic and atraumatic Eyes:  PERRLA/EOM intact; conjunctiva and lids normal. Lungs:  Clear bilaterally to auscultation and percussion. Heart:  Non-displaced PMI, chest non-tender; regular rate and rhythm, S1, S2 without murmurs, rubs or gallops. Carotid upstroke normal, no bruit. Normal abdominal aortic size,  no bruits.  Abdomen:  Bowel sounds positive; abdomen soft and non-tender without masses, organomegaly, or hernias noted. No hepatosplenomegaly. Msk:  Back normal, normal gait. Muscle strength and tone normal. Extremities:  No clubbing or cyanosis. Neurologic:  Alert and oriented x 3.   EKG  Procedure date:  12/02/2009  Findings:       ANGIOGRAPHIC DATA:   1. Ventriculography was done in the RAO projection.  Overall systolic       function appears to be well preserved.  There is not a definite       wall motion abnormality.   2. The left main is free of critical disease.   3. The LAD courses to the apex and the midportion of the LAD is what       appears to be a myocardial bridge.  It is not highly obstructive       and appears relatively smooth.  Other than minor luminal       irregularities, the LAD and its major branches are free of critical        disease.  There is a moderate-sized ramus intermedius that also is       free of critical disease.   4. The right coronary artery is a fairly smooth, large-caliber vessel       that has some tapered narrowing in the midportion, but not what       appears to be significant obstructive plaque.  The vessel       terminates as a posterior descending and posterolateral branch.   5. There is a anomalous circumflex which courses posteriorly.  This       divides into a small superior branch which is free of disease.       There is an intermediate branch which may have some mild ostial       narrowing, probably not more than about 40%, although it is little       difficult to lay out because of the nature of the anomalous       circumflex.  The AV portion of the vessel is large in caliber, and       provides a posterolateral branch that appears free of critical       disease.      CONCLUSION:   1. Well-preserved left ventricular function.   2. Mild myocardial bridging in the midportion of the left anterior       descending artery.   3. Mild luminal irregularities as noted above.   4. Anomalous circumflex coronary artery.      Echocardiogram  Procedure date:  01/02/2010  Findings:       Study Conclusions    - Left ventricle: Wall thickness was at the upper limits of normal.     Systolic function was normal. The estimated ejection fraction was     in the range of 55% to 60%. Features are consistent with a     pseudonormal left ventricular filling pattern, with concomitant     abnormal relaxation and increased filling pressure (grade 2     diastolic dysfunction).   - Aortic valve: Trivial regurgitation.   - Left atrium: The atrium was mildly dilated.   - Right atrium: The atrium was mildly dilated.  EKG  Procedure date:  04/06/2010  Findings:      NSR.. WNL.   Impression & Recommendations:  Problem # 1:  CAD (ICD-414.00)  see cath report information.  No high grade obstruction  at  present.  No reason he could not have surgery.  His updated medication list for this problem includes:    Lisinopril 40 Mg Tabs (Lisinopril) .[name]... Take one tablet by mouth daily    Amlodipine Besylate 5 Mg Tabs (Amlodipine besylate) .[name]... Take one tablet by mouth daily  Orders: EKG w/ Interpretation (93000) Cardiac Rehabilitation (Cardiac Rehab)  Problem # 2:  HYPERTENSION (ICD-401.9)  controlled at present.  His updated medication list for this problem includes:    Lisinopril 40 Mg Tabs (Lisinopril) .[name]... Take one tablet by mouth daily    Amlodipine Besylate 5 Mg Tabs (Amlodipine besylate) .[name]... Take one tablet by mouth daily  His updated medication list for this problem includes:    Lisinopril 40 Mg Tabs (Lisinopril) .[name]... Take one tablet by mouth daily    Amlodipine Besylate 5 Mg Tabs (Amlodipine besylate) .[name]... Take one tablet by mouth daily  Orders: EKG w/ Interpretation (93000)  Problem # 3:  HYPERCHOLESTEROLEMIA  IIA (ICD-272.0) at target.  However on amlodipine and vytorin so will switch to Crestor 20mg  daily.  Continue medical therapy and recheck in six weeks.   His updated medication list for this problem includes:    Crestor 20 Mg Tabs (Rosuvastatin calcium) .[name]... Take one tablet by mouth daily.  Problem # 4:  FATIGUE / MALAISE (ICD-780.79) Mechanism of this is unclear.  Dr. Maple Hudson recommended some weight loss.  Would favor cardiac rehab, perhaps graduate program, before considering CPX testing, as I am not sure how we would use this information.    Patient Instructions: 1)  Your physician has recommended you make the following change in your medication: STOP Vytorin, START Crestor 20mg  once a day 2)  Your physician recommends referral and attendance at a Cardiac Rehab Program [facility]) 3)  Your physician recommends that you schedule a follow-up appointment in: 2 MONTHS 4)  Your physician recommends that you return for a FASTING LIPID and LIVER Profile in 6 WEEKS   (414.00, 401.9, 272.0) 5)  You are cleared from a cardiac standpoint for upcoming wrist surgery.

## 2010-10-23 NOTE — Progress Notes (Signed)
Summary: Refer to Pulmonary and LEV  Phone Note Outgoing Call   Call placed by: Julieta Gutting, RN, BSN,  January 18, 2010 4:14 PM Call placed to: Patient Summary of Call: I spoke with the pt and gave him the results of his ECHO and PFTs.  Dr Riley Kill would like the pt to have a LEV duplex and pulmonary consult (Dr Maple Hudson).  I placed orders and [facility] will contact the pt.  Initial call taken by: Julieta Gutting, RN, BSN,  January 18, 2010 4:14 PM
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
## 2010-10-25 NOTE — Miscellaneous (Signed)
Summary: Orders Update  Clinical Lists Changes  Orders: Added new Test order of T-2 View CXR (71020TC) - Signed 

## 2010-10-25 NOTE — Assessment & Plan Note (Signed)
Summary: 6wk f/u    Visit Type:  6 weeks follow up Primary Provider:  Dwana Melena  CC:  No complaints.  History of Present Illness: Seems to be doing better from the standpoint of shortness of breath.  He does note that his feet get cold.  Also noted recently to have slightly elevated BUN at Dr. Marcelo Baldy office per patient.    Problems Prior to Update: 1)  Fatigue / Malaise  (ICD-780.79) 2)  Fatigue / Malaise  (ICD-780.79) 3)  Chest Pain-unspecified  (ICD-786.50) 4)  Gerd  (ICD-530.81) 5)  Hypertension  (ICD-401.9) 6)  Vasomotor Rhinitis  (ICD-477.9) 7)  Dyspnea  (ICD-786.05) 8)  Hypercholesterolemia Iia  (ICD-272.0) 9)  Hypertension, Benign  (ICD-401.1) 10)  Hypercholesterolemia Iia  (ICD-272.0) 11)  Prostate Cancer  (ICD-185) 12)  Cad  (ICD-414.00)  Current Medications (verified): 1)  Lantus Solostar 100 Unit/ml Soln (Insulin Glargine) .... 50 Units Once Daily 2)  Novolog 100 Unit/ml Soln (Insulin Aspart) .... 20 Units Daily 3)  Crestor 40 Mg Tabs (Rosuvastatin Calcium) .... Take One Tablet By Mouth Daily. 4)  Citalopram Hydrobromide 20 Mg Tabs (Citalopram Hydrobromide) .... Once A Day 5)  Lialda 1.2 Gm Tbec (Mesalamine) .... 2 Tablets Two Times A Day 6)  Lisinopril 40 Mg Tabs (Lisinopril) .... Take One Tablet By Mouth Daily 7)  Dexilant 60 Mg Cpdr (Dexlansoprazole) .... Take 1 Capsule By Mouth Once A Day 8)  Diazepam 5 Mg Tabs (Diazepam) .... As Needed 9)  Metformin Hcl 1000 Mg Tabs (Metformin Hcl) .... Take 1 Tablet By Mouth Two Times A Day 10)  Amlodipine Besylate 5 Mg Tabs (Amlodipine Besylate) .... Take One Tablet By Mouth Daily  Allergies: 1)  ! Codeine 2)  ! Zocor 3)  ! Lipitor (Atorvastatin) 4)  ! Betadine 5)  ! * Bee Stings  Vital Signs:  Patient profile:   76 year old male Height:      71 inches Weight:      230 pounds BMI:     32.19 Pulse rate:   76 / minute Pulse rhythm:   regular Resp:     18 per minute BP sitting:   126 / 84  (left arm) Cuff size:    large  Vitals Entered By: Vikki Ports (October 15, 2010 3:50 PM)  Physical Exam  General:  Well developed, well nourished, in no acute distress. Head:  normocephalic and atraumatic Eyes:  PERRLA/EOM intact; conjunctiva and lids normal. Lungs:  Clear bilaterally to auscultation and percussion. Heart:  PMI non displaced. Normal S1 and S2.  1/6 SEM.  no DM.   Abdomen:  Bowel sounds positive; abdomen soft and non-tender without masses, organomegaly, or hernias noted. No hepatosplenomegaly. Pulses:  pulses normal in all 4 extremities Extremities:  No clubbing or cyanosis. Neurologic:  Alert and oriented x 3.   EKG  Procedure date:  10/15/2010  Findings:      NSR.  Low voltage limb leads.   CXR  Procedure date:  09/12/2010  Findings:      DG CHEST 2 VIEW - [id number]   Clinical Data: Short of breath/hypertension   CHEST - 2 VIEW   Comparison: 11/23/2009   Findings: Heart size normal.  Aorta tortuous.  No congestive heart failure or active disease.  No pleural fluid or bony lesions. Prior cervical fusion.   IMPRESSION: Tortuous aorta - no active disease.   Read By:  Bernerd Limbo,  M.D.      VQ Scan  Procedure date:  09/12/2010  Findings:      Comparison:  Chest x-ray of 09/12/2010   Findings: The ventilation perfusion lung scan was performed in the usual manner with inhalation of 10 mCi of xenon-133, and intravenous injection of 6 mCi of technetium 16m MAA.  On the perfusion phase no segmental or subsegmental perfusion defect is seen.  On ventilation there is normal wash-in of the radionuclide. On the washout phase minimal gas trapping is present.  This scan is considered low probability for pulmonary embolism.   IMPRESSION: Low probability of pulmonary embolism.  Probable mild obstructive pulmonary disease.   Read By:  Juline Patch,  M.D.      Impression & Recommendations:  Problem # 1:  DYSPNEA (ICD-786.05) Not sure of mechanism of dyspnea.  of  importance, the patient has a neg VQ for chronic PE, and the CXR is unrevealing.  Could be a component of deconditioning.  Encouraged to walk. His updated medication list for this problem includes:    Lisinopril 40 Mg Tabs (Lisinopril) .[name]... Take one tablet by mouth daily    Amlodipine Besylate 5 Mg Tabs (Amlodipine besylate) .[name]... Take one tablet by mouth daily  Problem # 2:  HYPERTENSION (ICD-401.9) BP well controlled.  His updated medication list for this problem includes:    Lisinopril 40 Mg Tabs (Lisinopril) .[name]... Take one tablet by mouth daily    Amlodipine Besylate 5 Mg Tabs (Amlodipine besylate) .[name]... Take one tablet by mouth daily  Patient Instructions: 1)  Your physician recommends that you continue on your current medications as directed. Please refer to the Current Medication list given to you today. 2)  Your physician wants you to follow-up in:  6 MONTHS.  You will receive a reminder letter in the mail two months in advance. If you don't receive a letter, please call our office to schedule the follow-up appointment. Prescriptions: AMLODIPINE BESYLATE 5 MG TABS (AMLODIPINE BESYLATE) Take one tablet by mouth daily  #90 x 3   Entered by:   Julieta Gutting, RN, BSN   Authorized by:   Ronaldo Miyamoto, MD, [facility]   Signed by:   Julieta Gutting, RN, BSN on 10/15/2010   Method used:   Electronically to        [facility] (retail)       [address]       [place], [place]  [address]       Ph: [phone]       Fax: [phone]   RxID:   [id number] DEXILANT 60 MG CPDR (DEXLANSOPRAZOLE) Take 1 capsule by mouth once a day  #30 x 8   Entered by:   Julieta Gutting, RN, BSN   Authorized by:   Ronaldo Miyamoto, MD, [facility]   Signed by:   Julieta Gutting, RN, BSN on 10/15/2010   Method used:   Electronically to        [facility] (retail)       [address]       [place], [place]  [address]       Ph:  [phone]       Fax: [phone]   RxID:   [id number] AMLODIPINE BESYLATE 5 MG TABS (AMLODIPINE BESYLATE) Take one tablet by mouth daily  #30 x 8   Entered by:   Julieta Gutting, RN, BSN   Authorized by:   Ronaldo Miyamoto, MD, [facility]   Signed  by:   Julieta Gutting, RN, BSN on 10/15/2010   Method used:   Electronically to        [facility] (retail)       [address]       [place], [place]  [address]       Ph: [phone]       Fax: [phone]   RxID:   [id number] CRESTOR 40 MG TABS (ROSUVASTATIN CALCIUM) Take one tablet by mouth daily.  #30 x 8   Entered by:   Julieta Gutting, RN, BSN   Authorized by:   Ronaldo Miyamoto, MD, [facility]   Signed by:   Julieta Gutting, RN, BSN on 10/15/2010   Method used:   Electronically to        [facility] (retail)       [address]       [place], [place]  [address]       Ph: [phone]       Fax: [phone]   RxID:   [id number]  Note sent to pharmacy to delete Rx for 30 day supply of Amlodipine. Julieta Gutting, RN, BSN  October 15, 2010 4:46 PM

## 2010-10-26 ENCOUNTER — Emergency Department (HOSPITAL_COMMUNITY)
Admission: EM | Admit: 2010-10-26 | Discharge: 2010-10-26 | Disposition: A | Discharge status: Home / Self Care | Payer: Medicare Other | Attending: Emergency Medicine | Admitting: Emergency Medicine

## 2010-10-26 DIAGNOSIS — T782XXA Anaphylactic shock, unspecified, initial encounter: Secondary | ICD-10-CM | POA: Insufficient documentation

## 2010-10-26 DIAGNOSIS — I10 Essential (primary) hypertension: Secondary | ICD-10-CM | POA: Insufficient documentation

## 2010-10-26 DIAGNOSIS — T368X5A Adverse effect of other systemic antibiotics, initial encounter: Secondary | ICD-10-CM | POA: Insufficient documentation

## 2010-10-26 DIAGNOSIS — Z79899 Other long term (current) drug therapy: Secondary | ICD-10-CM | POA: Insufficient documentation

## 2010-10-26 DIAGNOSIS — E119 Type 2 diabetes mellitus without complications: Secondary | ICD-10-CM | POA: Insufficient documentation

## 2010-10-26 DIAGNOSIS — L299 Pruritus, unspecified: Secondary | ICD-10-CM | POA: Insufficient documentation

## 2010-10-26 DIAGNOSIS — R5383 Other fatigue: Secondary | ICD-10-CM | POA: Insufficient documentation

## 2010-10-26 DIAGNOSIS — R112 Nausea with vomiting, unspecified: Secondary | ICD-10-CM | POA: Insufficient documentation

## 2010-10-26 DIAGNOSIS — R5381 Other malaise: Secondary | ICD-10-CM | POA: Insufficient documentation

## 2010-12-10 ENCOUNTER — Ambulatory Visit (INDEPENDENT_AMBULATORY_CARE_PROVIDER_SITE_OTHER): Payer: Medicare Other | Admitting: Internal Medicine

## 2010-12-10 DIAGNOSIS — K5130 Ulcerative (chronic) rectosigmoiditis without complications: Secondary | ICD-10-CM

## 2010-12-16 LAB — D-DIMER, QUANTITATIVE: D-Dimer, Quant: 0.37 ug/mL-FEU (ref 0.00–0.48)

## 2011-01-01 LAB — RAPID STREP SCREEN (MED CTR MEBANE ONLY): Streptococcus, Group A Screen (Direct): NEGATIVE

## 2011-02-05 NOTE — Assessment & Plan Note (Signed)
[facility]                            CARDIOLOGY OFFICE NOTE   ESAIAH, WANLESS                    MRN:          [id number]  DATE:08/24/2007                            DOB:          [birth year]    SUBJECTIVE:  Mr. Lewin is in for followup.  Importantly, Mr. Jalomo has  not had a high-grade progressive coronary artery disease.  His findings  are as noted in his last cardiac catheterization study done in April of  this past year.  None-the-less, he has been having upper left chest  discomfort that radiates around to the back and down into the left arm.  It has not been relieved.  He thought that it might be related to his  previous neurosurgery.  He has been trying to get an appointment with  the neurosurgeons, but this has been to date not successful.   His blood pressures have been higher as of late, and today it was  146/99.  They have been running in the 140/90 range at least, over the  past few weeks.  He describes it as a constant pain that goes up through  the left chest, up into the back and down into the arm.   CURRENT MEDICATIONS:  1. Avapro 300 mg daily.  2. Plavix 75 mg daily.  3. Levbid 0.375 mg daily.  4. __________  PM, two daily.  5. Vytorin 10/20 mg daily.  6. Byetta 10 mcg injection twice daily.  7. Glucophage 500 mg, two in the morning and two in the evening.  8. Lantus 50 units daily.  9. Humalog daily.   LABORATORY DATA:  Recent studies have been unremarkable with regard to  his renal function.   The patient has no history of contrast allergy.   PHYSICAL EXAMINATION:  VITAL SIGNS:  Blood pressure 146/99, pulse 74.  LUNGS:  Fields are clear.  HEART:  Rhythm is regular.  PULSES:  The pulses are equal bilaterally.  EXTREMITIES:  Do not reveal significant edema.   Recent CK studies revealed a total CK of 116 with a troponin of 0.02.   His electrocardiogram today reveals a normal sinus rhythm with leftward-  oriented  axis.   Mr. Coye is stable.  He has been hypertensive and he does have some  shearing discomfort through to the back and into the arm.  His coronary  angiogram previously did not suggest critical disease.  While this could  be neurosurgical, I do have some concerns about the potential for aortic  dissection.  I am going to go ahead and start him on some Norvasc 5 mg  daily.  We will get a basic metabolic profile and I have considered  doing a CT angiogram, to rule out dissection.  While I am not highly  convinced, I am worried enough that I think we should proceed within the  next day or two, to try to get this resolved.  If this is negative, then  we will get him to see the neurosurgeons.     Loretha Brasil. Lia Foyer, MD, [facility]  Electronically Signed    TDS/MedQ  DD: 08/24/2007  DT: 08/24/2007  Job #: [id number]   cc:   [name], M.D.

## 2011-02-05 NOTE — Letter (Signed)
August 25, 2007    [name], M.D.  [address]  [place],  [place] [address]   RE:  TREVONTAE, LINDAHL  MRN:  [id number]  /  DOB:  09-11-1935   Dear Thedore Mins:   I had Mr. Cozzolino return today for a thoracic abdominal CT scan.  As you  know, he has been having chest pain radiating up to the left shoulder  and down the back.  This also involves the arm.  The reason I did this  is I was concerned about the potential for thoracic dissection.  As you  know, he had had elevated blood pressures and yesterday, we started him  on an additional agent.  Fortunately, the study today shows no evidence  of significant dissection.  There is a fair amount of aortic  calcification.  There is also a left renal calculus, and questionable  gall stones.  None the less, and importantly, there is no evidence of  significant aortic dissection.  When I have the formal report, I will  send it your way.   He is scheduled to see you this week.  He has had some trouble getting  in to see the neurosurgeons, but they do have his information and  promised they would call him.   Thanks so much for allowing me to share in his care, and we will see him  back in followup in 6 months.    Sincerely,      Loretha Brasil. Lia Foyer, MD, [facility]  Electronically Signed    TDS/MedQ  DD: 08/25/2007  DT: 08/25/2007  Job #: [id number]

## 2011-02-05 NOTE — Letter (Signed)
November 25, 2008    [name], M.D.  [address] [place],  [place] [address]   RE:  AWAD, GLADD  MRN:  [id number]  /  DOB:  [birth year]   Dear Thedore Mins,   I had the pleasure of seeing Mr. [name] in the office today in followup.  As you know, this nice gentleman has had prostate cancer, underwent  prostatectomy.  He is also receiving what sounds like Lupron injections,  and also receiving radiation.  In general, he has been stable.  He  denies any significant chest pain.   CURRENT MEDICATIONS:  1. Lantus SoloSTAR.  2. NovoLog.  3. Vytorin 10/40.  4. Lexapro 10 daily.  5. Norvasc 5 daily.  6. Metformin 1000 mg b.i.d.  7. Kapidex 60 daily.  8. Lialda 1.2 tabs, 2 tablets b.i.d.  9. Avapro 300 mg daily.   PHYSICAL EXAMINATION:  GENERAL:  He is alert and oriented, in no  distress.  VITAL SIGNS:  Blood pressure 130/80, pulse is 79.  LUNG:  Fields are clear.  CARDIAC:  Rhythm is regular.   IMPRESSION:  1. Hypertension.  2. Insulin-dependent diabetes.  3. Mild coronary irregularities without significant obstruction.      Catheterization date December 31, 2006.  4. Radionuclide imaging November 20, 2006, demonstrating probable      diaphragmatic attenuation.   RECOMMENDATIONS:  1. Continue current medical regimen.  2. Follow up with Dr. [name].  3. Dr. Nevada Crane doing lipid and liver profiles.    Sincerely,      Loretha Brasil. Lia Foyer, MD, [facility]  Electronically Signed    TDS/MedQ  DD: 11/25/2008  DT: 11/26/2008  Job #: [id number]

## 2011-02-08 NOTE — Discharge Summary (Signed)
[name], [name]                       ACCOUNT NO.:  [id number]   MEDICAL RECORD NO.:  [id number]                   PATIENT TYPE:  INP   LOCATION:  5025                                 FACILITY:  [facility]   PHYSICIAN:  Estill Bamberg. Ronnie Derby, M.D.              DATE OF BIRTH:  March 02, 1935   DATE OF ADMISSION:  10/25/2002  DATE OF DISCHARGE:  11/01/2002                                 DISCHARGE SUMMARY   ADMISSION DIAGNOSES:  1. End-stage osteoarthritis, right knee.  2. Diabetes mellitus, type 2.  3. Coronary artery disease.  4. Peripheral vascular disease.  5. Hypercholesterolemia.  6. Recent urinary tract infection.   DISCHARGE DIAGNOSES:  1. Right total knee arthroplasty.  2. Asymptomatic postoperative blood loss anemia.  3. Thigh and calf pain with multiple negative venous Dopplers for deep     venous thrombosis.  4. Diabetes mellitus, type 2.  5. Coronary artery disease.  6. Peripheral vascular disease.  7. Hypercholesterolemia.   HISTORY OF PRESENT ILLNESS:  The patient is a 76 year old white male with a  history of dramatic dislocation of his right knee in 1969 with a subsequent  ligamentous repair.  The patient has had intermittent swelling and  difficulty with range of motion ever since, but it has increased in  frequency and duration recently.  The patient is also having pain with any  type of range of motion.  He is having a popping and swelling.  The pain is  mostly located along the medial joint region.  X-rays reveal severe  tricompartmental osteoarthritis.   CURRENT MEDICATIONS:  1. Prilosec 20 mg p.o. b.i.d.  2. K-Dur 20 mEq p.o. daily.  3. Glucophage 500 mg p.o. q.a.m., 1000-1500 mg at noon, and 1000 mg p.o.     q.p.m.  4. Zocor 20 mg p.o. daily.  5. Prandin 2 mg p.o. q.a.m. p.r.n., 2 mg p.o. at noon, and 2 mg p.o. q.p.m.  6. Plavix 75 mg p.o. daily.  7. Pletal 100 mg p.o. b.i.d.  8. Accupril 5 mg p.o. daily.  9. Asacol 400 mg two tablets p.o. b.i.d.  10.      Quinine sulfate 200 mg p.o. q.h.s. p.r.n.   SURGICAL PROCEDURE:  On October 25, 2002, the patient was taken to the OR by  Estill Bamberg. Ronnie Derby, M.D., assisted by Evert Kohl, P.A.  Under general  anesthesia, the patient underwent a right total knee arthroplasty for  posttraumatic osteoarthritis.  The patient tolerated the procedure well.  There were no complications.  The tourniquet time was one hour and five  minutes.  No complications.  One Hemovac drain was left in place.  The  estimated blood loss was 300 mL.  The patient received a postoperative  femoral nerve block to assist in pain control.   CONSULTS:  The following routine consults were requested:  Physical therapy  and occupational therapy.   Venous Doppler studies were without DVT.  HOSPITAL COURSE:  On October 25, 2002, the patient was admitted to [facility]  [facility] under the care of Estill Bamberg. Ronnie Derby, M.D.  The patient  was taken to the OR where a right total knee arthroplasty was performed.  The patient tolerated this procedure well.  There were no complications.  The patient had femoral nerve postoperatively and was transferred to the  recovery room and to the orthopedic floor in good condition where the  patient was placed on Lovenox 30 mg subcu b.i.d. for routine DVT  prophylaxis.   The patient then incurred a seven-day postoperative course in which the  patient had a significant problem with lower extremity thigh and calf pain  on the right.  This was felt to be a combination of the tourniquet and the  patient being overly aggressive with following instructions about physical  therapy and exercises in bed.  The patient had no signs of a DVT with two  venous Dopplers that were performed over his hospitalization.  His leg  remained neuromotor and vascularly intact.  The patient remained afebrile  during this hospitalization.  His wound was remained benign for any signs of  infection.  The patient  did have a slightly elevated heart rate throughout  this hospitalization and this was felt due to the patient's reluctance to  use appropriate pain medications to cover pain due to the fact that he  indicated that he did not like the side effects of the medications.  He was  initially attempted with routine PCA and Percocet and this was changed over  to Dilaudid, still with similar refusal due to the side effects.  The  patient did work with physical therapy during this hospitalization.  On  postoperative day #7, he was felt ready to be discharged home in good  condition with the CPM from 0-90 degrees and the patient being able to  ambulate with just supervision in excess of 200 feet.   The patient did develop some postoperative blood loss anemia, but he denied  any orthostatic symptoms.  Due to his increased heart rate, he was typed and  crossed and transfused two units of packed red blood cells with minimal  improvement of his heart rate, but his hemoglobin did improve.   On postoperative day #7, it was felt that the patient was ready to be  discharged to home.  The patient was eager to go, so he was discharged to  home in good condition with a one-week follow-up and routine outpatient  protocol.   LABORATORY DATA:  Venous Doppler on October 27, 2002, showed no evidence of  DVT, SVT, or Baker's cyst bilaterally.  On November 01, 2002, venous Doppler  with no evidence of DVT, SVT, or Baker's cyst and no change since the  previous study.   The EKG on admission was normal sinus rhythm with 64 beats per minute.   The CBC on October 31, 2002, showed WBC 6.8, hemoglobin 10.4, up from a low  of 8.3, hematocrit 29.9, and platelets 236.   Routine chemistries on October 27, 2002, showed sodium 134, potassium 3.8,  glucose 156 (down from a high of 181), BUN 14, and creatinine 1.1.   Routine urinalysis on admission was normal.  The patient received a total of two units of packed red blood  cells during  this hospitalization.   DISCHARGE MEDICATIONS:  Medications upon discharge from the orthopedics  floor were:  1. Colace 100 mg p.o. b.i.d.  2. Trinsicon  one tablet p.o. t.i.d.  3. Laxative or enema of choice p.r.n.  4. Tylenol 650 mg p.o. q.4h. p.r.n.  5. Robaxin 500 mg p.o. q.6h. p.r.n.  6. Restoril 30 mg p.o. q.h.s. p.r.n.  7. Lovenox 30 mg subcu q.12h.  8. Prandin 2 mg p.o. t.i.d. a.c.  9. Protonix 40 mg p.o. b.i.d.  10.      Potassium chloride 20 mEq p.o. daily.  11.      Zocor 20 mg p.o. daily.  12.      Glucophage 500 mg p.o. q.a.m.  13.      Glucophage [phone] mg p.o. at noon.  14.      Glucophage 1000 mg p.o. q.p.m.  15.      Plavix 75 mg p.o. daily.  16.      Pletal 100 mg p.o. b.i.d. a.c.  17.      Lisinopril 5 mg p.o. daily.  18.      Quinine sulfate 260 mg p.o. q.h.s. p.r.n.   DISCHARGE INSTRUCTIONS:  The patient is to resume home medications:  1. Phenergan 25 mg one tablet every six hours for pain if needed.  2. Vicodin 5 mg one or two tablets every four to six hours for pain if     needed.  3. Lovenox 40 mg self-injection once a day for three more days.   ACTIVITY:  The patient may be active as tolerated, out of bed with use of a  walker.   DIET:  No restrictions.   WOUND CARE:  The patient may shower after two days with no drainage from the  wound.  Keep the wound clean and dry.  Change the dressing daily. If any  signs of infection with a temperature greater than 101.5 degrees, pain not  relieved by pain medications, foul-smelling discharge, or swelling and  redness around the wound, the patient is to call Dr. Estill Bamberg. Lucey's  office for instructions.   SPECIAL INSTRUCTIONS:  CPM 0-90 degrees six to eight hours a day increased  by 5 degrees a day.   FOLLOW-UP:  The patient should call for a follow-up appointment with Dr.  Ronnie Derby in his office 8-10 days from the date of discharge.  Please call [phone] for a follow-up appointment.    CONDITION ON DISCHARGE:  The patient's condition upon discharge to home is  listed as improved and good.     Evert Kohl, P.A.                      Estill Bamberg. Ronnie Derby, M.D.    RWK/MEDQ  D:  12/17/2002  T:  12/18/2002  Job:  [id number]   cc:   Angus G. Everette Rank, M.D.  [address]  [place]  [place] [address]  Fax: [phone]

## 2011-02-08 NOTE — Op Note (Signed)
NAMETHEOPLIS, GARCIAGARCIA                       ACCOUNT NO.:  [id number]   MEDICAL RECORD NO.:  [id number]                   PATIENT TYPE:  AMB   LOCATION:  [place]                                  FACILITY:  [facility]   PHYSICIAN:  Daryll Brod, M.D.                    DATE OF BIRTH:  1935/04/09   DATE OF PROCEDURE:  11/02/2003  DATE OF DISCHARGE:                                 OPERATIVE REPORT   PREOPERATIVE DIAGNOSES:  1. Carpal tunnel of left hand.  2. Triangular fibrocartilage complex tear, left wrist.   POSTOPERATIVE DIAGNOSES:  1. Carpal tunnel of left hand.  2. Triangular fibrocartilage complex tear, left wrist.  3. Volar/ulnar ligament tears with chondromalacia, left wrist.   OPERATION:  Carpal tunnel release, left; arthroscopy with debridement of  triangular fibrocartilage complex tear and volar/ulnar ligament tears, left  wrist.   SURGEON:  Daryll Brod, M.D.   ASSISTANT:  Gentry Fitz.   ANESTHESIA:  General.   HISTORY:  The patient is a 76 year old male with a history of ulnar-sided  wrist pain and carpal tunnel syndrome.  EMG and nerve conductions are  positive.   PROCEDURE:  The patient is brought to the operating room where a general  anesthetic was carried out without difficulty.  He was prepped using  Duraprep, supine position, left arm free.  The arthroscopy was performed  first.  The joint was inflated through the 3.4 portal after placement in the  arthroscopy tower.  Ten pounds of traction were applied.  Following  inflation of the joint, transverse incision was made and deepened with a  hemostat, after assuring that the EPL was not involved.  The blunt trocar  was used to enter the joint.  The joint was inspected.  Significant  chondromalacia was present.  A small partial tear of the scapholunate  ligament was apparent.  There was no gross instability visualized.  The TFCC  showed a traumatic 1-A-type tear.  There was an avulsion of the volar/ulnar  ligaments  which were enfolded into the joint.  Irrigation catheter was  placed in 6-U, a 4.5 portal was opened, the ulnar side of the joint was  inspected with the arthroscope; the lunotriquetral ligament appeared intact.  Chondromalacia was present over the entire carpus.  The scope was then  reinserted into 3.4.  A shaver was then inserted into 4.5 and a debridement  of the triangular fibrocartilage complex was then attended to; this  converted the transverse tear into an oval tear.  The volar ligaments were  then debrided.  The scope was alternated through 3.4 and 4.5 and a further  debridement and shrinkage were then performed with the ArthroWand.  Mid-  carpal joint was then inspected; there was no gross instability.  The  cartilage appeared intact to the capitate to the mid-carpal joint.  Significant STT arthritis was present.  Several loose bodies were present.  A  volar STT portal was opened and these were removed without difficulty  after localization with a 22-gauge needle.  A longitudinal incision was made  just volar to the APL tendon.  The joint was then irrigated, the portals  closed, the limb removed from the arthroscopy tower, exsanguinated.  The  tourniquet placed high on the arm was inflated to 250 mmHg.  A longitudinal  incision was made in the palm and carried through subcutaneous tissue.  Bleeders were electrocauterized.  The palmar fascia was split, superficial  palmar arch identified, the flexor tendons to the ring and little finger  identified.  To the ulnar side of the median nerve, the carpal retinaculum  was incised with sharp dissection.  A right-angle and Sewall retractor were  placed between skin and forearm fascia.  The fascia was released for  approximately 2 cm proximal the wrist crease under direct vision.  The  tenosynovial tissue was thickened.  No further lesions were identified.  The  wound was irrigated.  The skin was closed with interrupted 5-0 nylon suture.  A  sterile compressive dressing and splint were applied.  The patient  tolerated the procedure well and was taken to the recovery room for  observation in satisfactory condition.   He is discharged to home to return to the [facility] in 1  week on Talwin NX and Keflex.                                               Daryll Brod, M.D.    Aretta Nip  D:  11/02/2003  T:  11/02/2003  Job:  [id number]

## 2011-02-08 NOTE — Op Note (Signed)
NAMEDONTA, [name]             ACCOUNT NO.:  [id number]   MEDICAL RECORD NO.:  [id number]          PATIENT TYPE:  AMB   LOCATION:  [place]                          FACILITY:  [facility]   PHYSICIAN:  Estill Bamberg. Ronnie Derby, M.D. DATE OF BIRTH:  1934/12/20   DATE OF PROCEDURE:  04/03/2005  DATE OF DISCHARGE:                                 OPERATIVE REPORT   PREOPERATIVE DIAGNOSIS:  Left knee medial meniscal tear and osteoarthritis.   POSTOPERATIVE DIAGNOSIS:  Left knee medial meniscal tear and osteoarthritis.   OPERATION PERFORMED:  Left knee arthroscopy with partial medial  meniscectomy, medial and patellofemoral compartment chondroplasty.   SURGEON:  Estill Bamberg. Ronnie Derby, M.D.   ANESTHESIA:  General.   INDICATIONS FOR PROCEDURE:  The patient is a 76 year old white male with  mechanical symptoms and MRI evidence of a medial meniscal tear.  Informed  consent was obtained.   DESCRIPTION OF PROCEDURE:  The patient was laid supine and administered  general anesthesia.  The left lower extremity was prepped and draped in the  usual sterile fashion.  Diagnostic arthroscopy revealed grade 3 and 4  chondromalacia patella and medial compartments.  Chondroplasty was performed  in both areas. There was a complex posterior horn medial meniscal tear.  The  straight basket forceps and a [place] shaver was used to perform an  aggressive posterior horn partial medial meniscectomy.  Lateral compartment  was normal other than some calcifications which was probably from steroid  injections.  I then lavaged the knee and closed with 4-0 nylon sutures,  dressed with Xeroform, dressing sponges, sterile Webril and an Ace wrap.   COMPLICATIONS:  None.   DRAINS:  None.       SDL/MEDQ  D:  04/03/2005  T:  04/03/2005  Job:  [id number]

## 2011-02-08 NOTE — H&P (Signed)
NAMELADDIE, MATH             ACCOUNT NO.:  [id number]   MEDICAL RECORD NO.:  [id number]           PATIENT TYPE:  AMB   LOCATION:                                FACILITY:  [facility]   PHYSICIAN:  Hildred Laser, M.D.    DATE OF BIRTH:  1934/12/29   DATE OF ADMISSION:  DATE OF DISCHARGE:  LH                                HISTORY & PHYSICAL   PRIMARY CARE PHYSICIAN:  Dr. Everette Rank.   CHIEF COMPLAINT:  Colonoscopy and EGD with possible ED.   HISTORY OF PRESENT ILLNESS:  Mr. Lindaman is a 76 year old Caucasian male with  history of ulcerative colitis. He first had symptoms in 1999. He was  diagnosed in 2001 with ulcerative colitis. His last colonoscopy was in April  of 2001. He has been maintained on Asacol 400 mg 5 p.o. b.i.d. His most  recent flare was in October 2006. He was treated with a course of prednisone  at that time. His diarrhea is much better now. He generally has between two  and four semi-formed bowel movements a day. Occasionally, he notes small  amount of hematochezia. Denies any melena or mucus in his stools. His main  concern today is a constant dull aching pain to his left lower quadrant. He  rates the pain as a 5/10 on a pain scale and describes the pain as a  soreness. His weight has steadily increased. He is up 14-1/2 pounds in the  last 3 months. He denies any fevers or chills. Denies any anorexia. He has  chronic GERD. He has frequent hoarseness. He states his heartburn and  indigestion is fairly well controlled on Nexium 40 mg b.i.d. He is having  some dysphagia with both liquids and solids. He has a history of this and  has had Maloney dilatation in the past. He denies any odynophagia. He does  occasionally have regurgitation. He has had nocturnal leg cramps. They have  responded well to quinine supplementation.   CURRENT MEDICATIONS:  1.  Byetta 10 mcg 2 shots daily.  2.  Metformin 500 mg q.i.d.  3.  Vytorin 10/20 mg daily.  4.  Pletal 100 mg b.i.d.  5.   Asacol 400 mg 5 p.o. b.i.d.  6.  Nexium 40 mg b.i.d.  7.  K-Dur 20 mEq b.i.d.  8.  Avapro 150 mg daily.  9.  Levbid 0.375 mg daily p.r.n.   ALLERGIES:  ASPIRIN, CODEINE and DARVON which cause nausea and vomiting.   PAST MEDICAL HISTORY:  1.  Lumbar spine and C spine surgery.  2.  History of UC diagnosed on colonoscopy in 2001.  3.  He has a history of peptic ulcer disease secondary to end-stage therapy.  4.  History of chronic GERD.  5.  History of BPH.  6.  Diabetes mellitus.  7.  Hypercholesterolemia.  8.  Status post tonsillectomy.  9.  Benign fatty tumor removed from the base of his left neck.  10. Right knee surgery.  11. Vasectomy.   FAMILY HISTORY:  Positive for a brother diagnosed with colon cancer at age  37. Later succumbed to  this two years later.   SOCIAL HISTORY:  Mr. Casanova has been married for 30 years. He has two grown  healthy children. He is retired from [employer]. He reports a 10-pack-  year history of tobacco use many years ago. Denies any alcohol or drug use.   REVIEW OF SYSTEMS:  CONSTITUTIONAL:  See HPI. Denies any insomnia. Denies  any fatigue. CARDIOVASCULAR:  Denies any chest pain or palpitations.  PULMONARY:  Denies any shortness of breath, dyspnea, cough or hemoptysis.  GASTROINTESTINAL:  See HPI.   PHYSICAL EXAMINATION:  VITAL SIGNS:  Weight 216 pounds, height 71 inches,  temperature 97.9 degrees, blood pressure 122/80, pulse 68.  GENERAL:  Mr. Slomski is a 76 year old Caucasian male who is alert, oriented,  pleasant and cooperative in no acute distress.  HEENT:  Sclerae are clear, nonicteric. Conjunctivae are pink. Oropharynx  pink and moist without any lesions.  NECK:  Supple without any masses or thyromegaly.  CHEST:  Heart regular rate and rhythm with normal S1 and S2 without murmurs,  clicks, rubs or gallops.  LUNGS:  Clear to auscultation bilaterally.  ABDOMEN:  Positive bowel sounds x4. No bruits auscultated. Nondistended,  soft.  There is mild tenderness in the left lower quadrant and left  suprapubic area on deep palpation. There is no rebound tenderness or  guarding. No hepatosplenomegaly or mass.  EXTREMITIES:  Without clubbing or edema bilaterally.  SKIN:  Pink, warm and dry without any rash or jaundice.   IMPRESSION:  Mr. Willis is a 76 year old Caucasian male with a six-year  history of ulcerative colitis. His last flare was in October 2006, and this  responded to prednisone. His bowel movements are under control. Rarely does  he have bloody diarrhea. However, he has a constant left lower quadrant dull  ache. He has a family history of colon cancer in a brother diagnosed at age  24. Therefore, he is due for further evaluation with colonoscopy at this  time. He has chronic GERD controlled on PPI except for intermittent  hoarseness which may be related to LPR. He has dysphagia both to solids and  liquids and is going to need EGD to rule out esophageal web, ring or  stricture.   PLAN:  1.  Will scheduled EGD with possible ED and colonoscopy with Dr. Laural Golden in      the near future. I have discussed both procedures including risks and      benefits which include but are not limited to bleeding, infection,      perforation and drug reaction. He agrees with the plan and consent will      be obtained. He is going to take half of his metformin the day prior to      the procedure.  2.  Continue Asacol as before. Continue Nexium 40 mg b.i.d.  3.  Labs today to include LFTs, CBC, MET7 and sed rate.  4.  Further recommendations pending procedures.      Les Pou, N.P.      Hildred Laser, M.D.  Electronically Signed    KC/MEDQ  D:  10/23/2005  T:  10/23/2005  Job:  [id number]   cc:   Angus G. Everette Rank, MD  Fax: [phone]

## 2011-02-08 NOTE — Assessment & Plan Note (Signed)
[facility]                            CARDIOLOGY OFFICE NOTE   DONAVYN, FECHER                    MRN:          [id number]  DATE:12/25/2006                            DOB:          [birth year]    Mr. [name] is in for a followup.  He says over the past six months he has  continued to experience really rather profound fatigue.  He thinks this  is getting worse.  He tries to get on his treadmill, stationary bike,  and stair-stepper.  He says he is fairly limited.  He sometimes has some  left parasternal chest discomfort.  He underwent radionuclide imaging  which did not demonstrate a significant perfusion defect nor ST segment  depression.  His echocardiogram revealed only mitral annular  calcification.  His hemoglobin is normal.  He has had some bleeding  recently and his Plavix has been stopped by Dr. Laural Golden.  He denies rest  pain, diaphoresis, or major other cardiac symptoms.   PHYSICAL EXAMINATION:  VITAL SIGNS:  The blood pressure is 110/80.  The  pulse is 68.  NECK:  The jugular veins are not distended.  The carotid upstrokes are  brisk.  LUNGS:  Fields are clear to auscultation and percussion.  HEART:  The PMI is not displaced.  There are normal first and second  heart sounds without a murmur, rub, or gallop.  ABDOMEN:  Soft.   The electrocardiogram demonstrates normal sinus rhythm.  No acute  changes are noted.   Recent hemoglobin is 14.1, hematocrit of 40.9, and platelet count of  145.   IMPRESSION:  1. Profound exertional fatigue.  2. Non-insulin-dependent diabetes.  3. Prior history of mild coronary plaque.  4. 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   He and I have had an extensive discussion.  My level of suspicion is  relatively low based on the radionuclide imaging study, but his history  is really quite impressive.  We discussed various options.  His chest x-  ray is non-revealing, and his  hemoglobin is normal.  He does not have  significant lung disease.  He has profound fatigue with exertion.  He  has had some mild chest discomfort.  He is diabetic.  And, he had some  mild disease previously.  Despite the normal radionuclide imaging study,  we have discussed the options and agreed that we should proceed with  diagnostic outpatient cardiac catheterization.  He understands the risks  and benefits.  We will proceed next week.     Loretha Brasil. Lia Foyer, MD, [facility]  Electronically Signed    TDS/MedQ  DD: 12/25/2006  DT: 12/25/2006  Job #: [id number]

## 2011-02-08 NOTE — Letter (Signed)
January 19, 2007    [name], M.D.  [address],  [place] [address]   RE:  MYERS, TUTTEROW  MRN:  [id number]  /  DOB:  11-Apr-1935   Dear Thedore Mins:   Had the pleasure of seeing Mr. Nylund in the office today at a followup  visit.  Cardiac wise, he is stable.  Since discharge from the hospital  for diagnostic catheterization he has done well.  He has about a 50%  stenosis in his LAD, associated with his catheterization but no other  critical disease at the present time.  I was really quite pleased with  the appearance of his cath study.  Based on this, he probably does not  need specific cardiac therapy.   CURRENT MEDICATIONS:  1. Nexium 40 mg b.i.d.  2. Potassium 20 mEq b.i.d.  3. Avapro 30 mg daily.  4. Plavix 75 mg daily.  5. Pletal 100 mg b.i.d.  6. Levbid daily.  7. Lialda daily.  8. Vytorin 10/20 daily.  9. Byetta b.i.d.  10.Diazepam p.r.n.  11.Glucophage 2 in the morning, 1 at lunch, and 2 in the evening.   Of note, he noticed some significant swelling in his right lower  extremity yesterday, associated with some pain, and this was below the  knee yet that all resolved.   PHYSICAL EXAMINATION:  The blood pressure today is 124/80, the pulse is  55, the cardiac rhythm is regular, the groin actually is healed from the  catheterization site.  He has a right knee scar.  The extremities below  the knee demonstrate no difference in size and there is no obvious  swelling in the right lower extremity.  There are a fair amount of  varicosities.  His distal pulses are intact.   I have specifically reviewed his situation in detail.  From a cardiac  standpoint he can be treated medically.  Given his prior noncardiac  conditions I am a bit concerned and I am not sure that he needs to take  Plavix.  He currently is on no aspirin.  There may be an issue regarding  whether or not he needs Pletal as well.  Apparently, the foot physicians  have ordered this.  Finally, he and I  clearly reviewed all of his  medicine list and it is not clear that he was on any type of diuretic.  He is on potassium supplement which probably is not necessary and he has  a basic profile done in your office recently.  I have asked him to stop  the potassium at this point in time and get a repeat basic profile in  you office in 2 weeks.    Sincerely,      Loretha Brasil. Lia Foyer, MD, [facility]  Electronically Signed    TDS/MedQ  DD: 01/19/2007  DT: 01/19/2007  Job #: [id number]

## 2011-02-08 NOTE — Op Note (Signed)
NAME:  Christophe, Rising                         ACCOUNT NO.:  [id number]   MEDICAL RECORD NO.:  [id number]                    PATIENT TYPE:   LOCATION:                                       FACILITY:   PHYSICIAN:  Estill Bamberg. Ronnie Derby, M.D.              DATE OF BIRTH:  1935/03/09   DATE OF PROCEDURE:  10/25/2002  DATE OF DISCHARGE:                                 OPERATIVE REPORT   SURGEON:  Estill Bamberg. Ronnie Derby, M.D.   ASSISTANT:  Evert Kohl, P.A.   PREOPERATIVE DIAGNOSIS:  Right total knee arthroplasty, right post-traumatic  osteoarthritis of the knee.   POSTOPERATIVE DIAGNOSIS:  Right total knee arthroplasty, right post-  traumatic osteoarthritis of the knee.   ANESTHESIA:  General.   INDICATION FOR PROCEDURE:  The patient is a 76 year old male, 39 years  status post trauma to the knee with ligamentous reconstruction.  He went  onto osteoarthritis of the knee which was probably a combination of post-  traumatic and osteoarthritic degeneration.  Informed consent was obtained.   DESCRIPTION OF PROCEDURE:  The patient was laid supine and after general  anesthesia and Foley catheter placement, the right lower extremity was  prepped and draped in the usual sterile fashion.  The old medial incision  was used which was approximately 4 cm more medial than I am used to making,  but it worked quite well.  I used a fresh 10 blade to make a median  parapatellar arthrotomy and evert the patella.  It measured at 22 cm, reamed  down to 13 mm, and then used the template to drill the larger holes and with  the trial in place, it also measured 22 mm.  I left the patella everted and  developed a subperiosteal sleeve of the medial crest of the tibia.  I then  brought into flexion, cut the ACL and PCL, and used the tibial alignment  guide to remove the cut surface of the tibia 2 mm thick on the medial side.  I then entered the intramedullary canal of the femur with a drill and set  the  intramedullary guide on a 60-degree valgus cut, tamped it into place,  put the distal femoral cutting block in place, and then made our distal  femoral cut.  After cutting, the axis was 3 degrees, and I sized to a size F  and pinned the femoral cutter in place and made the anterior, posterior, and  chamfer cuts.  I then used a size 6 on the tibia and made a drill and keel.  With the trials in place, excellent flexion and extension gap balance was  obtained.  I did go through and remove the medial and lateral meniscus,  ACL, PCL, and posterior osteophytes prior to trialing.  I then irrigated  copiously, cemented in the size 8 femur, size 6 tibia, size 35 patella, and  cemented them with polyethylene, and allowed  this cement to harden.  I then  let the tourniquet down, cauterized bleeding vessels, and left a Hemovac  deep to the arthrotomy.  I closed the arthrotomy with interrupted #1 Vicryl,  deep soft tissues with 0 Vicryl, and the subcutaneous with 2-0 Vicryl and  skin staples.  A dressing with Adaptic, 4 x 4's, and sterile _______, and  TED stocking.  Tourniquet time:  1 hour, 5 minutes.  Complications:  None.  Drains:  One Hemovac.  Estimated blood loss:  300 cc.                                               Estill Bamberg. Ronnie Derby, M.D.    SDL/MEDQ  D:  10/25/2002  T:  10/25/2002  Job:  [id number]

## 2011-02-08 NOTE — Op Note (Signed)
[name], [name]             ACCOUNT NO.:  [id number]   MEDICAL RECORD NO.:  [id number]          PATIENT TYPE:  AMB   LOCATION:  DAY                           FACILITY:  [facility]   PHYSICIAN:  [name], M.D.    DATE OF BIRTH:  [birth year]   DATE OF PROCEDURE:  11/13/2005  DATE OF DISCHARGE:                                 OPERATIVE REPORT   PROCEDURE:  Esophagogastroduodenoscopy followed by colonoscopy.   INDICATIONS:  The patient is a 76 year old Caucasian male with chronic GERD  who is maintained on double dose PPI but still has hoarseness and dysphagia  to liquids and solids. He has chronic UC. He was treated for a relapse in  October with prednisone, and he has had partial response. He still has  diarrhea to semi-formed stools and just does not feel that his colitis is  well-controlled. He is undergoing diagnostic colonoscopy. His UC was  initially diagnosed in 2001.   Procedure and risks were reviewed with the patient, and informed consent was  obtained.   MEDICINES FOR CONSCIOUS SEDATION:  Cetacaine spray for pharyngeal topical  anesthesia, Demerol 50 mg IV, Versed 8 mg IV.   FINDINGS:  Procedure performed in endoscopy suite. The patient's vital signs  and O2 saturation were monitored during the procedure and remained stable.   PROCEDURE #1:  Esophagogastroduodenoscopy. The patient was placed in left  lateral position and Olympus videoscope was passed via oropharynx without  any difficulty into esophagus.   Esophagus. Mucosa of the esophagus normal. GE junction was at 40 cm. No ring  or stricture was noted.   Stomach. It was empty and distended very well with insufflation. Folds of  proximal stomach were normal. There was a small polyp at gastric body which  was ablated via cold biopsy. There was erythema and granularity to antral  mucosa with few petechiae. Pyloric channel was patent. Angularis, fundus and  cardia were examined by retroflexing the scope and were  normal.   Duodenum. Bulbar mucosa was normal. Scope was passed to the second part of  duodenum where mucosa and folds were normal. Endoscope was withdrawn, and  the patient prepared for procedure #2.   PROCEDURE #2:  Colonoscopy. Rectal examination performed. No abnormality  noted on external or digital exam. Olympus videoscope was placed in rectum  and advanced under vision into sigmoid colon and beyond. Preparation was  satisfactory. Few scattered diverticula were noted at sigmoid colon. The  scope was passed into cecum which was identified by ileocecal valve and  appendiceal orifice. As the scope was withdrawn, colonic mucosa was  carefully examined. There was small flat polyp at ascending colon about 5 mm  in diameter which was ablated via cold biopsy. Mucosa of rest of the colon  was normal except the area of focal colitis. There was some mucosal  friability and erosions. Pictures taken for the record followed by biopsy.  Mucosa of rest of the sigmoid colon and rectum was normal. Scope was  retroflexed to examine anorectal junction, and hemorrhoids were noted below  the dentate line. Endoscope was straightened and withdrawn. The patient  tolerated the procedure well.   FINAL DIAGNOSIS:  1.  Normal examination of esophagus.  2.  Small gastric polyp ablated via cold biopsy.  3.  Nonerosive antral gastritis. H pylori infection needs to be ruled out.  4.  Focal colitis at sigmoid colon which would not explain the patient's      biopsy. The mucosa rest of the colon was entirely normal. Colitis of      sigmoid colon mucosa was biopsied for histology. This would not explain      patient's persistent diarrhea.  5.  Small polyp ablated via cold biopsy from the ascending colon.  6.  External hemorrhoids.   I suspect his diarrhea may well be secondary to one of his medications,  particularly Glucophage.   RECOMMENDATIONS:  1.  He will continue his usual medications including Nexium 40  mg b.i.d.  2.  H pylori serology will be checked today.  3.  Sugar-free Citrucel 1 tablespoonful daily.  4.  I will be contacting the patient with biopsy results.  5.  The patient was also given prescription for _________ cream to be      applied to perianal area.  b.i.d. until discomfort resolved.      [name], M.D.  Electronically Signed     NR/MEDQ  D:  11/13/2005  T:  11/14/2005  Job:  [id number]   cc:   Brandon G. Everette Rank, MD  Fax: [phone]

## 2011-02-08 NOTE — H&P (Signed)
NAMEDESMOND, SZABO                       ACCOUNT NO.:  [id number]   MEDICAL RECORD NO.:  [id number]                   PATIENT TYPE:  INP   LOCATION:  NA                                   FACILITY:  [facility]   PHYSICIAN:  Estill Bamberg. Ronnie Derby, M.D.              DATE OF BIRTH:  10/28/1934   DATE OF ADMISSION:  10/25/2002  DATE OF DISCHARGE:                                HISTORY & PHYSICAL   CHIEF COMPLAINT:  Right knee pain.   HISTORY OF PRESENT ILLNESS:  The patient is a 76 year old white male with a  history of swelling and difficulty with range of motion in his knee over the  last year or so.  The patient had a traumatic dislocation of his right knee  in 1969 requiring significant surgery and ligament repair.  The patient did  well but over the last several years he has been noting some significant  swelling and difficulty with range of motion of his right knee.  He has also  noted increased discomfort primarily located along the medial joint line.  He does have popping.  The pain does radiate up the thigh on occasional.  It  does swell significantly after increased amounts of activity.  X-rays reveal  severe tricompartmental arthritis.   ALLERGIES:  CODEINE.   CURRENT MEDICATIONS:  1. Prilosec 20 mg p.o. b.i.d.  2. Potassium chloride 20 mEq p.o. daily.  3. Glucophage 500 mg tablet one tablet p.o. q.a.m., two to three tablets     p.o. at noon, two tablets p.o. q.p.m.  4. Zocor 20 mg p.o. daily.  5. Prandin 2 mg, one tablet p.o. q.a.m. PRN, one tablet p.o. at noon and one     tablet p.o. q.p.m.  6. Plavix 75 mg p.o. daily.  7. Plendil 100 mg one tablet at noon, one tablet p.o. q.p.m.  8. Accupril 5 mg p.o. daily.  9. Asacol 400 mg, two tablets p.o. q.a.m., two tablets p.o. q.p.m.  10.      Quinine sulfate 200 mg p.o. q.h.s. PRN.   PAST MEDICAL HISTORY:  1. Diabetes.  2. Coronary artery disease.  3. Recent two month history of urinary tract infection.  4.  Hypercholesterolemia.  5. Peripheral vascular disease.   PAST SURGICAL HISTORY:  1. Lumbar surgery in 1966.  2. Right knee repair in 1969.  3. Cervical spinal fusion in 2000.  4. Prostate surgery in 2001.  5. Patient denies any complications of the above-mentioned surgical     procedures.   SOCIAL HISTORY:  The patient is a 76 year old healthy-appearing, well-  developed white male with no history of smoking or alcohol use.  He is  married.  He lives in a one story house with five steps to the main  entrance.  He is a retired Economist.   PRIMARY CARE PHYSICIAN:  Angus G. Everette Rank, M.D. at [phone].   CARDIOLOGIST:  Marijo Conception. Wall, M.D.  [facility]   FAMILY HISTORY:  Mother is deceased from heart disease. Father is deceased  from a stroke.  The patient has two brothers deceased; one from brain  cancer, one from a myocardial infarction.  Two sisters deceased; one from  complications of diabetes, the other complications from intestinal surgery.  The patient has one brother alive with esophageal cancer and four sisters  alive and in good medical health.   REVIEW OF SYMPTOMS:  Positive for glasses use.  He does have occasional  shortness of breath that corrects with cool fresh air.  He has been  evaluated by Dr. Everette Rank, thought related to some bronchial spasms.  The  patient's reflux is well controlled with Nexium.  He does have some problems  with urinary frequency due to his prostate surgery.   PHYSICAL EXAMINATION:  VITAL SIGNS:  Height is 5 feet, 11 inches.  Weight is  208 pounds.  Blood pressure 118/78. Pulse 80 and regular.  Respirations 14.  GENERAL:  The patient is a healthy appearing, well-developed white male who  ambulates without any significant limp.  He is able to get on and off the  examination table without any difficulty.  HEENT: Head was normocephalic, atraumatic.  Pupils equal, round, reactive to  light and accommodation.  Extraocular movements intact.  Sclerae  is  nonicteric.  External ears without deformities.  Hearing is grossly intact.  Oral buccal mucosa is pink and moist without lesions.  Upper and lower  dentition is in good repair.  Uvula is midline.  The patient is able to  swallow without any difficulty.  NECK:  Supple with no palpable lymphadenopathy.  Thyroid gland is nontender.  He has good range of motion of his cervical spine without any difficulty or  tenderness.  The patient has no tenderness with percussion along the entire  spinal column.  CHEST:  Lung sounds were clear and equal bilaterally.  No wheezes, rales,  rhonchi or rubs noted.  HEART:  Regular rate and rhythm, S1 and S2 auscultated.  No murmurs, rubs or  gallops are noted.  ABDOMEN:  Soft, nontender.  Bowel sounds normal and active throughout.  He  has no tenderness to deep palpation.  There is no costovertebral angle  tenderness to percussion.  No hepatosplenomegaly is palpable.  EXTREMITIES:  Upper extremities are symmetrically sized and shaped.  He has  good range of motion of his shoulders, elbows and wrists.  Motor strength  was 5/5.  Lower extremities, right and left hip at full extension, flexion  up to 120, 20 to 30 degrees of internal and external rotation without any  difficulty.  Right knee has a well healed medial scar.  He has several  varicosities about the knee that appear to be large, irregular shaped.  He  was tender to percussion along the medial and lateral joint line, no  effusion or sign of erythema.  He had about 10 degrees from full extension  and flexion back to 95 degrees.  He has about 5 to 10 degrees valgus varus  laxity, no anterior or posterior drawer, the calf is nontender.  Left knee  is without any signs of erythema or ecchymoses.  No palpable effusion.  He  had full range of motion from 0 degrees back to 120 degrees.  No calf  tenderness.  The ankles were symmetrical with good dorsiflexion and plantar  flexion. PERIPHERAL  VASCULATURE:  Carotid pulses are 2+ with no bruits.  Radial  pulses are 2+.  Posterior tibial and dorsalis pedis pulses are 2+.  He has  no significant pigmentation changes or edema but he has various varicosities  in the lower extremities.  NEUROLOGICAL:  Patient is conscious, alert and appropriate, held an easy  conversation with the examiner.  Cranial nerves II-XII are grossly intact.  Deep tendon reflexes of the upper and lower extremities were symmetrical,  right to left.  He is grossly intact to light touch sensation from head to  toe.  No focal neurological defects.  BREAST/RECTAL/GENITOURINARY:  Examinations are deferred at this time.   IMPRESSION:  1. Severe end-stage traumatically induced osteoarthritis of the right knee.  2. Diabetes mellitus type 2.  3. Coronary artery disease.  4. Peripheral vascular disease.  5. Hypercholesterolemia.  6. Two month course of urinary tract infection last November.   PLAN:  The patient will be admitted to [facility] on  October 25, 2002 under the care of Dr. Lara Mulch.  The patient will  undergo all routine labs and tests prior to having a right total knee  arthroplasty performed.     Evert Kohl, P.A.                      Estill Bamberg. Ronnie Derby, M.D.    RWK/MEDQ  D:  10/05/2002  T:  10/05/2002  Job:  [id number]

## 2011-02-08 NOTE — Assessment & Plan Note (Signed)
[name]                            CARDIOLOGY OFFICE NOTE   [name], [name]                    MRN:          [id number]  DATE:11/13/2006                            DOB:          Dec 13, 1934    REFERRED BY:  Dr. Marjean [name].   CHIEF COMPLAINT:  Fatigue.   HISTORY OF PRESENT ILLNESS:  Mr. [name] is a 76 year old gentleman who  has previously undergone catheterization by me and is followed by Dr.  Verl [name]. The patient underwent catheterization in 1999. At that time, he  had segmental plaque in the mid-vessel of the left anterior descending  artery of about 30-40%. He had a non-anomalous circumflex with this, and  this was associated with some mild irregularity prior to the  bifurcation. Otherwise, he did not have critical disease. The patient  does have diabetes and has been on diabetic medications for some time.  His main complaint is that of rather profound fatigue. He is able to  walk reasonably well and has an artificial knee. He does this slowly. He  is able to use a stationary bike, however, without chest pressure, but  again, the major complaint appears to be that of predominant fatigue.   He says he does have some bad days and good days, and has some mild  discomfort at times in his chest.   PAST MEDICAL HISTORY:  Remarkable for a knee replacement in 2004. He has  had neck plates in 8938. He had back surgery in 1966. He has had  hypercholesterolemia and left total knee replacement in 2005. He has  type-2 diabetes and has had prior diverticulitis.   ALLERGIES:  CODEINE  ZOCOR  LIPITOR  CRESTOR   CURRENT MEDICATIONS:  1. Byetta 10 mg b.i.d.  2. Metformin 500 mg daily.  3. Vytorin 10/20 daily.  4. Pletal 100 mg b.i.d.  5. Plavix 75 mg daily.  6. Asacol 400 mg multiple tablets daily.  7. Nexium 40 mg b.i.d.  8. KCL 20 mEq b.i.d.  9. Avapro 300 mg daily.  10.Quinine sulfate p.r.n.  11.Levbid 0.375 mg daily.  12.Diazepam  p.r.n.   FAMILY HISTORY:  His father died at 85 of a stroke and had high blood  pressure and diabetes. His mother died at 19 and had heart failure and  hypertension. A grandparent had cancer of the stomach. One brother had  cancer of the lung and died at 98. One brother died at 56 of a brain  tumor and one died at 99 of an MI. He has had 6 sisters. One died at 10  during surgery and 5 sisters have been alive and well with a variety of  problems including hypertension and diabetes.   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 Two times a week he uses a  stair climber and a stationary bike. He is married and has 2 children.   REVIEW OF SYSTEMS:  The patient wears glasses. He has left arm numbness  at times. He has a history of colitis. He has had some kidney stones and  occasional frequency. He does not add salt. His weight  varies from 196-  215.   PHYSICAL EXAMINATION:  GENERAL: He is an alert and oriented gentleman.  VITAL SIGNS: Height 5 feet 11 inches, weight 215 pounds, blood pressure  125/70 equal bilaterally, the pulse is 80.  LUNGS: Fields are clear to auscultation and percussion.  CARDIAC: PMI is non-displaced. There is no rub or gallop. There is a 1/6  systolic ejection murmur.  ABDOMEN: Soft without hepatosplenomegaly.  EXTREMITIES: Reveal no edema. Pulses are intact.   The electrocardiogram demonstrates normal sinus rhythm with low voltage  QRS. The chest x-ray reveals the lungs to be clear with mild  cardiomegaly, descending thoracic aorta is ectatic.   RECOMMENDATIONS:  1. At the present time, we will recommend a stress Cardiolite. This      should give [place] an idea of what his ejection fraction, and also      where there is anything to suggest a perfusion deficit.  2. Chest x-ray done.  3. A lipid profile to assess his lipid status.  4. We may consider an echocardiogram.     [name]. Brandon Foyer, MD, [facility]  Electronically Signed    TDS/MedQ  DD: 12/01/2006  DT:  12/01/2006  Job #: [id number]

## 2011-02-08 NOTE — Cardiovascular Report (Signed)
NAMEMARCQUES, WRIGHTSMAN             ACCOUNT NO.:  [id number]   MEDICAL RECORD NO.:  [id number]          PATIENT TYPE:  OIB   LOCATION:  NA                           FACILITY:  [facility]   PHYSICIAN:  Loretha Brasil. Lia Foyer, MD, FACCDATE OF BIRTH:  12-06-1934   DATE OF PROCEDURE:  12/31/2006  DATE OF DISCHARGE:                            CARDIAC CATHETERIZATION   INDICATIONS:  Mr. Turrell is a delightful 76 year old gentleman who has  long history of diabetes.  He has had progressive fatigue over the past  6 months.  His myocardial perfusion imaging study was nondiagnostic with  some diaphragmatic attenuation, but the plan was to try to treat him  medically.  However, with continued complaints, and history of diabetes,  we elected to proceed.   PROCEDURE:  1. Catheter placement without left heart catheterization.  2. Selective coronary arteriography.   DESCRIPTION OF PROCEDURE:  The patient was brought to the cath lab and  prepped and draped in usual fashion. Through an anterior puncture the  femoral artery was entered.  The 4-French sheath was placed.  There was  some tortuosity in the iliac artery, but we were able to access this.  Views of the left coronary artery were then obtained with a JL-5  diagnostic catheter.  We had known from the previous study that the  patient had a large intermediate, and actually an anomalous circumflex.  A no-toe right coronary catheter was then used to engage the right  coronary artery.  Backing the catheter out we were able to visualize the  circumflex really quite well.  We also used a right bypass catheter to  try to engage the circumflex, but because of the iliac tortuosity had  some difficulty backing into the vessel.  Adequate images of the  anomalous circumflex were obtained.  There were no complications.  Because of his minimal elevation of BUN we elected not to do  ventriculography given the previous echocardiographic findings.   HEMODYNAMIC DATA.:   Central aortic pressure 131/94, mean 113.   ANGIOGRAPHIC DATA:  1. Left main coronary artery was large and free of critical disease.  2. The left anterior descending artery courses to the apex where it      wraps the apical tip and provides the distal portion of the      inferior wall.  Importantly, in the midportion of the vessel there      is some what appears to be probably segmental narrowing. This looks      like a myocardial bridge. This was previously described as 30-40      and might currently be about 50, but does not appear to be      critically plaqued by any means.  There were two tiny diagonal      branches proximally followed by a large diagonal branch which is      free of critical disease. There is a large ramus intermedius vessel      and is somewhat tortuous but fairly smooth and without significant      narrowing.  3. The right coronary artery is a large-caliber vessel that  provides a      posterior descending and a posterolateral system.  The posterior      descending branch bifurcates, the posterolateral system also      bifurcates.  There is not critical obstruction in either vessel.      With this injection, there is also evidence of an anomalous origin      to the part of the circumflex system as previously noted.  The      ramus arises from the left and the marginal system provides a      couple of small marginal branches which are best seen during      injection with the right bypass catheter.  There may be very      minimal ostial tapering to this vessel, but the vessel itself is      smooth throughout without critical obstruction.   CONCLUSION:  1. Mild narrowing in the mid left anterior descending artery which      could represent either segmental plaquing or perhaps even a      myocardial bridge.  2. Large normal ramus intermedius vessel.  3. 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  4. Smooth right coronary  artery.   DISPOSITION:  Despite the patient's longstanding diabetes, the coronary  vasculature is really quite smooth.  I would doubt that any of his  symptoms are explained by the current coronary findings.  We will talk  with his primary care physician about current status.      Loretha Brasil. Lia Foyer, MD, [facility]  Electronically Signed     TDS/MEDQ  D:  12/31/2006  T:  12/31/2006  Job:  [id number]   cc:   Angus G. Everette Rank, MD  [name], M.D.  [facility]

## 2011-04-15 ENCOUNTER — Encounter: Payer: Self-pay | Admitting: Cardiology

## 2011-04-16 ENCOUNTER — Ambulatory Visit (INDEPENDENT_AMBULATORY_CARE_PROVIDER_SITE_OTHER): Payer: Medicare Other | Admitting: Cardiology

## 2011-04-16 ENCOUNTER — Encounter: Payer: Self-pay | Admitting: Cardiology

## 2011-04-16 VITALS — BP 118/70 | HR 74 | Ht 68.0 in | Wt 233.0 lb

## 2011-04-16 DIAGNOSIS — E7800 Pure hypercholesterolemia, unspecified: Secondary | ICD-10-CM

## 2011-04-16 DIAGNOSIS — I2510 Atherosclerotic heart disease of native coronary artery without angina pectoris: Secondary | ICD-10-CM

## 2011-04-16 DIAGNOSIS — I10 Essential (primary) hypertension: Secondary | ICD-10-CM

## 2011-04-16 DIAGNOSIS — R0602 Shortness of breath: Secondary | ICD-10-CM

## 2011-04-16 MED ORDER — AMLODIPINE BESYLATE 5 MG PO TABS: 5.0000 mg | ORAL_TABLET | Freq: Every day | ORAL | Status: DC

## 2011-04-16 NOTE — Patient Instructions (Signed)
Your physician recommends that you schedule a follow-up appointment in:6 months with Dr. Riley Kill  Please talk to your primary care doctor, Dr. Margo Aye, regarding a sleep study evaluation.

## 2011-05-17 NOTE — Progress Notes (Signed)
HPI:  The patient is seen in follow up.  He is generally pretty stable.  He exercises but it does not seem to help his energy level very much.  Denies progressive chest pain.  He is not too disciplined with his diet in general, and admits to this.  He will lose 4-5 lbs, then regtain.  He sleeps ok, but will snore some.    Current Outpatient Prescriptions  Medication Sig Dispense Refill  . amLODipine (NORVASC) 5 MG tablet Take 1 tablet (5 mg total) by mouth daily.  90 tablet  3  . insulin aspart (NOVOLOG) 100 UNIT/ML injection Inject 20 Units into the skin 3 (three) times daily before meals.        . insulin glargine (LANTUS SOLOSTAR) 100 UNIT/ML injection Inject 50 Units into the skin at bedtime.        . metFORMIN (GLUCOPHAGE) 1000 MG tablet Take 1,000 mg by mouth 2 (two) times daily with a meal.        . rosuvastatin (CRESTOR) 40 MG tablet Take 40 mg by mouth daily.          Allergies  Allergen Reactions  . Atorvastatin   . Bee Venom   . Codeine   . Latex   . Povidone-Iodine   . Simvastatin     Past Medical History  Diagnosis Date  . Diabetes mellitus   . Coronary artery disease   . Hypercholesterolemia   . PVD (peripheral vascular disease)   . Prostate cancer   . Hypertension     Past Surgical History  Procedure Date  . Tonsillectomy   . Back surgey   . Back surgery   . Vasectomy   . Knee surgery     right  . Shoulder surgery   . Prostatectomy     Family History  Problem Relation Age of Onset  . Colon cancer Brother     History   Social History  . Marital Status: Married    Spouse Name: N/A    Number of Children: N/A  . Years of Education: N/A   Occupational History  . retired    Social History Main Topics  . Smoking status: Former Games developer  . Smokeless tobacco: Not on file  . Alcohol Use: No  . Drug Use: No  . Sexually Active: Not on file   Other Topics Concern  . Not on file   Social History Narrative  . No narrative on file    ROS: Please  see the HPI.  All other systems reviewed and negative.  PHYSICAL EXAM:  BP 118/70  Pulse 74  Ht 5\' 8"  (1.727 m)  Wt 233 lb (105.688 kg)  BMI 35.43 kg/m2  General: Well developed, well nourished, in no acute distress. Head:  Normocephalic and atraumatic. Neck: no JVD Lungs: Clear to auscultation and percussion. Heart: Normal S1 and S2.  No murmur, rubs or gallops.  Abdomen:  Normal bowel sounds; soft; non tender; no organomegaly Pulses: Pulses normal in all 4 extremities. Extremities: No clubbing or cyanosis. No edema. Neurologic: Alert and oriented x 3.  EKG:  NSR.  Low voltage QRS.  ASSESSMENT AND PLAN:

## 2011-05-17 NOTE — Assessment & Plan Note (Signed)
Does not really have any obstructive disease at all per se.  Has only mild irregularities. Has had some fatigue and chest pain in the past---although this is the second cath procedure to document no critical CAD.

## 2011-05-17 NOTE — Assessment & Plan Note (Signed)
Remains on rosuvastatin.  Is a diabetic.  Will need to have follow up labs at some point with Dr. Margo Aye.

## 2011-05-17 NOTE — Assessment & Plan Note (Signed)
Stable at this point in time.

## 2011-05-17 NOTE — Assessment & Plan Note (Signed)
CXR looked good, and VQ was low probability last year.  Supportive, with weight loss and continued activity.  Consider a sleep study perhaps.

## 2011-05-29 ENCOUNTER — Ambulatory Visit: Payer: Medicare Other | Attending: Internal Medicine | Admitting: Sleep Medicine

## 2011-05-29 DIAGNOSIS — G4730 Sleep apnea, unspecified: Secondary | ICD-10-CM

## 2011-05-29 DIAGNOSIS — G4733 Obstructive sleep apnea (adult) (pediatric): Secondary | ICD-10-CM | POA: Insufficient documentation

## 2011-06-04 NOTE — Procedures (Signed)
[name], [name]             ACCOUNT NO.:  [id number]  MEDICAL RECORD NO.:  [id number]          PATIENT TYPE:  OUT  LOCATION:  SLEEP LAB                     FACILITY:  [facility]  PHYSICIAN:  Marchelle Rinella A. Gerilyn Pilgrim, M.D. DATE OF BIRTH:  03/09/1935  DATE OF STUDY:  05/29/2011                           NOCTURNAL POLYSOMNOGRAM  REFERRING PHYSICIAN:  ZACK HALL  REFERRING PHYSICIAN:  Catalina Pizza, MD  INDICATION FOR STUDY:  This is a 76 year old who presents with witnessed apnea, loud snoring, and fatigue.  This study is being done to evaluate for obstructive sleep apnea syndrome.  EPWORTH SLEEPINESS SCORE: 1. BMI 32.  MEDICATIONS:  Insulin, Crestor, metformin, and amlodipine.  SLEEP ARCHITECTURE:  Architectural summary:  The total recording time is 405 minutes.  Sleep efficiency 70%, sleep latency 46 minutes, REM latency 147 minutes.  Stage N1 of 80%, N2 of 56%, N3 of 81%, and REM sleep 25%.  RESPIRATORY DATA:  Respiratory summary:  Baseline oxygen saturation is 94, lowest saturation 75 during REM sleep.  Diagnostic AHI 13 and RDI 14.  Most of the events occurred during REM sleep with a REM AHI of 43.  CARDIAC DATA:  Electrocardiogram summary:  Average heart rate is 71 with no significant dysrhythmias observed.  MOVEMENT-PARASOMNIA:  Limb movement summary:  PLM index quite high as 77.  There was also significantly increased phasic EMG activity seen both during REM and non-REM sleep.  IMPRESSIONS-RECOMMENDATIONS:  Impression: 1. Mild obstructive sleep apnea syndrome. 2. Severe periodic limb movement disorder sleep. 3. Increased phasic EMG activity which can be seen with REM sleep     behavior disorder, but this is a clinical diagnosis.  Recommendations:  Formal CPAP titration study.  Also, consider dopamine agonist for nocturnal limb movements.  Thanks for this referral.     [name] A. Gerilyn Pilgrim, M.D.    KAD/MEDQ  D:  06/04/2011 16:10:96  T:  06/04/2011 10:29:05  Job:  [id number]

## 2011-06-19 ENCOUNTER — Ambulatory Visit: Payer: Medicare Other | Attending: Internal Medicine | Admitting: Sleep Medicine

## 2011-06-19 DIAGNOSIS — Z6832 Body mass index (BMI) 32.0-32.9, adult: Secondary | ICD-10-CM | POA: Insufficient documentation

## 2011-06-19 DIAGNOSIS — G4733 Obstructive sleep apnea (adult) (pediatric): Secondary | ICD-10-CM

## 2011-06-20 NOTE — Progress Notes (Signed)
Pt experienced REM in lateral position at 12 CWP (cm of H2O pressure), however REM supine was not exhibited. Philips Respironics full face mask Amara size medium was used for titration - pt's preference of mask.  Pt tolerated titration well. Of note mean SaO2 via pulse ox for study was 91.3.

## 2011-06-24 NOTE — Procedures (Signed)
[name], [name]             ACCOUNT NO.:  [id number]  MEDICAL RECORD NO.:  [id number]          PATIENT TYPE:  OUT  LOCATION:  SLEEP LAB                     FACILITY:  [facility]  PHYSICIAN:  Jarita Raval A. Gerilyn [name], M.D. DATE OF BIRTH:  28-Dec-1934  DATE OF STUDY:  06/19/2011                           NOCTURNAL POLYSOMNOGRAM  REFERRING PHYSICIAN:  ZACK HALL  REFERRING PHYSICIAN:  Catalina Pizza, MD  INDICATION FOR STUDY:  This is a 76 year old who had a previous study documented obstructive sleep apnea syndrome.  This is a CPAP titration recording study.  EPWORTH SLEEPINESS SCORE: 1. BMI 32.  MEDICATIONS:  Insulin, Crestor, metformin, and amlodipine.  SLEEP ARCHITECTURE:  Architectural summary:  The total recording time is 432.5 minutes.  Sleep efficiency 59.7%, sleep latency 51.5 minutes, REM latency 70, although recorded by the computer, this was recalculated manually.  Stage N1 of 16.5%, N2 of 66.9%, N3 of 2%, and REM sleep 21%.  RESPIRATORY DATA:  Respiratory summary:  Baseline oxygen saturation 94, lowest saturation 86 during non-REM sleep.  The patient was titrated on positive pressure CPAP from 6 until 12.  Optimal pressure is 12 with resolution of obstructive events and good tolerance.  CARDIAC DATA:  Electrocardiogram summary:  Average heart rate 67 with no significant dysrhythmias observed.  MOVEMENT-PARASOMNIA:  Limb movement summary:  PLM index is 38.  There is also significant fragmentary/phasic EMG activity seen both in REM and non-REM sleep.  IMPRESSIONS-RECOMMENDATIONS:  Impression: 1. Obstructive sleep apnea syndrome which responds well to a CPAP of     12. 2. Moderate to severe periodic limb movement disorder. 3. Increased fragmentary myoclonus/phasic EMG activity.  Recommendations:  CPAP of 12.     [name], M.D.    KAD/MEDQ  D:  06/24/2011 08:52:15  T:  06/24/2011 10:59:38  Job:  [id number]

## 2011-06-28 LAB — URINALYSIS, ROUTINE W REFLEX MICROSCOPIC
Bilirubin Urine: NEGATIVE
Ketones, ur: NEGATIVE
Nitrite: NEGATIVE
Urobilinogen, UA: 0.2
pH: 5.5

## 2011-06-28 LAB — BASIC METABOLIC PANEL
CO2: 29
Chloride: 97
Creatinine, Ser: 1.22
GFR calc Af Amer: >60

## 2011-06-28 LAB — DIFFERENTIAL
Basophils Absolute: 0
Eosinophils Absolute: 0.1
Eosinophils Relative: 1
Monocytes Absolute: 0.5

## 2011-06-28 LAB — CBC
HCT: 47.4
MCHC: 33.8
MCV: 84
RBC: 5.65

## 2011-10-10 DIAGNOSIS — E119 Type 2 diabetes mellitus without complications: Secondary | ICD-10-CM | POA: Diagnosis not present

## 2011-10-10 DIAGNOSIS — E1149 Type 2 diabetes mellitus with other diabetic neurological complication: Secondary | ICD-10-CM | POA: Diagnosis not present

## 2011-10-14 DIAGNOSIS — E1139 Type 2 diabetes mellitus with other diabetic ophthalmic complication: Secondary | ICD-10-CM | POA: Diagnosis not present

## 2011-10-14 DIAGNOSIS — E11319 Type 2 diabetes mellitus with unspecified diabetic retinopathy without macular edema: Secondary | ICD-10-CM | POA: Diagnosis not present

## 2011-10-21 DIAGNOSIS — E119 Type 2 diabetes mellitus without complications: Secondary | ICD-10-CM | POA: Diagnosis not present

## 2011-10-21 DIAGNOSIS — I10 Essential (primary) hypertension: Secondary | ICD-10-CM | POA: Diagnosis not present

## 2011-10-21 DIAGNOSIS — E785 Hyperlipidemia, unspecified: Secondary | ICD-10-CM | POA: Diagnosis not present

## 2011-11-14 DIAGNOSIS — H109 Unspecified conjunctivitis: Secondary | ICD-10-CM | POA: Diagnosis not present

## 2011-12-02 ENCOUNTER — Encounter: Payer: Self-pay | Admitting: *Deleted

## 2011-12-12 DIAGNOSIS — L253 Unspecified contact dermatitis due to other chemical products: Secondary | ICD-10-CM | POA: Diagnosis not present

## 2011-12-12 DIAGNOSIS — Z85828 Personal history of other malignant neoplasm of skin: Secondary | ICD-10-CM | POA: Diagnosis not present

## 2011-12-12 DIAGNOSIS — L259 Unspecified contact dermatitis, unspecified cause: Secondary | ICD-10-CM | POA: Diagnosis not present

## 2011-12-13 ENCOUNTER — Ambulatory Visit (INDEPENDENT_AMBULATORY_CARE_PROVIDER_SITE_OTHER): Payer: Medicare Other | Admitting: Cardiology

## 2011-12-13 ENCOUNTER — Encounter: Payer: Self-pay | Admitting: Cardiology

## 2011-12-13 VITALS — BP 120/78 | HR 66 | Ht 71.0 in | Wt 235.8 lb

## 2011-12-13 DIAGNOSIS — I2510 Atherosclerotic heart disease of native coronary artery without angina pectoris: Secondary | ICD-10-CM

## 2011-12-13 DIAGNOSIS — R0602 Shortness of breath: Secondary | ICD-10-CM | POA: Diagnosis not present

## 2011-12-13 DIAGNOSIS — I10 Essential (primary) hypertension: Secondary | ICD-10-CM

## 2011-12-13 DIAGNOSIS — R5381 Other malaise: Secondary | ICD-10-CM

## 2011-12-13 DIAGNOSIS — R5383 Other fatigue: Secondary | ICD-10-CM | POA: Diagnosis not present

## 2011-12-13 DIAGNOSIS — E7800 Pure hypercholesterolemia, unspecified: Secondary | ICD-10-CM

## 2011-12-13 NOTE — Patient Instructions (Signed)
Your physician wants you to follow-up in: 6 MONTHS.  You will receive a reminder letter in the mail two months in advance. If you don't receive a letter, please call our office to schedule the follow-up appointment.  Your physician recommends that you continue on your current medications as directed. Please refer to the Current Medication list given to you today.

## 2011-12-19 DIAGNOSIS — E1149 Type 2 diabetes mellitus with other diabetic neurological complication: Secondary | ICD-10-CM | POA: Diagnosis not present

## 2011-12-19 DIAGNOSIS — E119 Type 2 diabetes mellitus without complications: Secondary | ICD-10-CM | POA: Diagnosis not present

## 2011-12-28 NOTE — Progress Notes (Signed)
   HPI:  Sees Dr. Nevada Crane in follow up.  He had a sleep study, and is wearing CPAP.  Pretty sure it has helped him.  He is doing well overall.  No major complaints.  He clearly states that he is less tired.    Current Outpatient Prescriptions  Medication Sig Dispense Refill  . amLODipine (NORVASC) 5 MG tablet Take 1 tablet (5 mg total) by mouth daily.  90 tablet  3  . insulin glargine (LANTUS SOLOSTAR) 100 UNIT/ML injection Inject 50 Units into the skin at bedtime.        . metFORMIN (GLUCOPHAGE) 1000 MG tablet Take 1,000 mg by mouth 2 (two) times daily with a meal.        . DISCONTD: insulin aspart (NOVOLOG) 100 UNIT/ML injection Inject 20 Units into the skin 3 (three) times daily before meals.        [name] DISCONTD: rosuvastatin (CRESTOR) 40 MG tablet Take 40 mg by mouth daily.          Allergies  Allergen Reactions  . Atorvastatin   . Bee Venom   . Codeine   . Latex   . Povidone-Iodine   . Simvastatin     Past Medical History  Diagnosis Date  . Diabetes mellitus   . Coronary artery disease   . Hypercholesterolemia   . PVD (peripheral vascular disease)   . Prostate cancer   . Hypertension     Past Surgical History  Procedure Date  . Tonsillectomy   . Back surgey   . Back surgery   . Vasectomy   . Knee surgery     right  . Shoulder surgery   . Prostatectomy     Family History  Problem Relation Age of Onset  . Colon cancer Brother     History   Social History  . Marital Status: Married    Spouse Name: N/A    Number of Children: N/A  . Years of Education: N/A   Occupational History  . retired    Social History Main Topics  . Smoking status: Never Smoker   . Smokeless tobacco: Not on file  . Alcohol Use: No  . Drug Use: No  . Sexually Active: Not on file   Other Topics Concern  . Not on file   Social History Narrative  . No narrative on file    ROS: Please see the HPI.  All other systems reviewed and negative.  PHYSICAL EXAM:  BP 120/78  Pulse 66   Ht 5' 11"  (1.803 m)  Wt 235 lb 12.8 oz (106.958 kg)  BMI 32.89 kg/m2  General: Well developed, well nourished, in no acute distress. Head:  Normocephalic and atraumatic. Neck: no JVD Lungs: Clear to auscultation and percussion. Heart: Normal S1 and S2.  No murmur, rubs or gallops.  Abdomen:  Normal bowel sounds; soft; non tender; no organomegaly Pulses: Pulses normal in all 4 extremities. Extremities: No clubbing or cyanosis. No edema. Neurologic: Alert and oriented x 3.  EKG:  NSR.  Low voltage QRS and leftward oriented axis.    ASSESSMENT AND PLAN:

## 2011-12-29 NOTE — Assessment & Plan Note (Signed)
Main symptoms are controlled.  He does not have high grade obstructive disease, but does have DM.  Would continue his medical therapy for now.

## 2011-12-29 NOTE — Assessment & Plan Note (Signed)
Also better.

## 2011-12-29 NOTE — Assessment & Plan Note (Signed)
Currently off of statins, and I will defer to his primary MD

## 2011-12-29 NOTE — Assessment & Plan Note (Signed)
Currently improved.

## 2011-12-29 NOTE — Assessment & Plan Note (Signed)
BP is better, and this may correspond to use of CPAP as well.

## 2012-01-09 ENCOUNTER — Encounter (HOSPITAL_COMMUNITY): Payer: Self-pay

## 2012-01-09 ENCOUNTER — Other Ambulatory Visit (HOSPITAL_COMMUNITY): Payer: Self-pay | Admitting: Internal Medicine

## 2012-01-09 ENCOUNTER — Ambulatory Visit (HOSPITAL_COMMUNITY)
Admission: RE | Admit: 2012-01-09 | Discharge: 2012-01-09 | Disposition: A | Discharge status: Home / Self Care | Payer: Medicare Other | Source: Ambulatory Visit | Attending: Internal Medicine | Admitting: Internal Medicine

## 2012-01-09 DIAGNOSIS — I6789 Other cerebrovascular disease: Secondary | ICD-10-CM | POA: Diagnosis not present

## 2012-01-09 DIAGNOSIS — I6350 Cerebral infarction due to unspecified occlusion or stenosis of unspecified cerebral artery: Secondary | ICD-10-CM | POA: Diagnosis not present

## 2012-01-09 DIAGNOSIS — G319 Degenerative disease of nervous system, unspecified: Secondary | ICD-10-CM | POA: Diagnosis not present

## 2012-01-09 DIAGNOSIS — I639 Cerebral infarction, unspecified: Secondary | ICD-10-CM

## 2012-01-09 DIAGNOSIS — E119 Type 2 diabetes mellitus without complications: Secondary | ICD-10-CM | POA: Diagnosis not present

## 2012-01-09 DIAGNOSIS — R55 Syncope and collapse: Secondary | ICD-10-CM | POA: Diagnosis not present

## 2012-01-09 DIAGNOSIS — Z125 Encounter for screening for malignant neoplasm of prostate: Secondary | ICD-10-CM | POA: Diagnosis not present

## 2012-01-09 DIAGNOSIS — R42 Dizziness and giddiness: Secondary | ICD-10-CM | POA: Diagnosis not present

## 2012-02-06 DIAGNOSIS — N529 Male erectile dysfunction, unspecified: Secondary | ICD-10-CM | POA: Diagnosis not present

## 2012-02-06 DIAGNOSIS — N478 Other disorders of prepuce: Secondary | ICD-10-CM | POA: Diagnosis not present

## 2012-02-06 DIAGNOSIS — N489 Disorder of penis, unspecified: Secondary | ICD-10-CM | POA: Diagnosis not present

## 2012-02-06 DIAGNOSIS — N393 Stress incontinence (female) (male): Secondary | ICD-10-CM | POA: Diagnosis not present

## 2012-02-06 DIAGNOSIS — N35919 Unspecified urethral stricture, male, unspecified site: Secondary | ICD-10-CM | POA: Diagnosis not present

## 2012-02-06 DIAGNOSIS — Z8546 Personal history of malignant neoplasm of prostate: Secondary | ICD-10-CM | POA: Diagnosis not present

## 2012-02-13 DIAGNOSIS — L253 Unspecified contact dermatitis due to other chemical products: Secondary | ICD-10-CM | POA: Diagnosis not present

## 2012-02-18 DIAGNOSIS — J069 Acute upper respiratory infection, unspecified: Secondary | ICD-10-CM | POA: Diagnosis not present

## 2012-02-18 DIAGNOSIS — M62838 Other muscle spasm: Secondary | ICD-10-CM | POA: Diagnosis not present

## 2012-02-27 ENCOUNTER — Telehealth: Payer: Self-pay | Admitting: Cardiology

## 2012-02-27 NOTE — Telephone Encounter (Signed)
New problem:  Patient was advise from mail order to changed medication due to cost. atorvastatin 40 mg.

## 2012-02-27 NOTE — Telephone Encounter (Signed)
I spoke with the pt and he would like to switch his cholesterol medication to Atorvastatin in the place of Crestor 69m.  The atorvastatin is a cheaper medication for the pt.  I made the pt aware that this medication is listed as an allergy.  The pt states that he is not allergic to this medication.  The pt does not have his lipids followed in our office, they are checked by PCP.   The pt will bring a recent lipid result into the office for Dr SLia Foyerto review and make further recommendations about changing med.

## 2012-02-28 NOTE — Telephone Encounter (Signed)
Pt brought cholesterol results into the office.  I have placed them in Dr Maren Beach folder to review.

## 2012-03-03 MED ORDER — ATORVASTATIN CALCIUM 40 MG PO TABS: 40.0000 mg | ORAL_TABLET | Freq: Every day | ORAL | Status: DC

## 2012-03-03 NOTE — Telephone Encounter (Signed)
Left message for pt to call, dr Lia Foyer reviewed pts labs and has given the okay for the pt to switch to atorvastatin 40 mg daily. Will confirm and remove atorvastatin from pts allergies after confirmed with pt.

## 2012-03-03 NOTE — Telephone Encounter (Signed)
Spoke with pt, he confirms to his knowledge he has never taken atorvastatin. [facility]. He has an appt to have his labs checked with his PCP in august and will make sure we get a copy of those for our records.

## 2012-03-05 ENCOUNTER — Encounter: Payer: Self-pay | Admitting: Cardiology

## 2012-03-10 DIAGNOSIS — E119 Type 2 diabetes mellitus without complications: Secondary | ICD-10-CM | POA: Diagnosis not present

## 2012-03-10 DIAGNOSIS — E1149 Type 2 diabetes mellitus with other diabetic neurological complication: Secondary | ICD-10-CM | POA: Diagnosis not present

## 2012-03-20 DIAGNOSIS — N471 Phimosis: Secondary | ICD-10-CM | POA: Diagnosis not present

## 2012-03-20 DIAGNOSIS — N35919 Unspecified urethral stricture, male, unspecified site: Secondary | ICD-10-CM | POA: Diagnosis not present

## 2012-03-20 DIAGNOSIS — N529 Male erectile dysfunction, unspecified: Secondary | ICD-10-CM | POA: Diagnosis not present

## 2012-03-20 DIAGNOSIS — Z8546 Personal history of malignant neoplasm of prostate: Secondary | ICD-10-CM | POA: Diagnosis not present

## 2012-03-20 DIAGNOSIS — N393 Stress incontinence (female) (male): Secondary | ICD-10-CM | POA: Diagnosis not present

## 2012-03-20 DIAGNOSIS — N478 Other disorders of prepuce: Secondary | ICD-10-CM | POA: Diagnosis not present

## 2012-04-10 DIAGNOSIS — L0292 Furuncle, unspecified: Secondary | ICD-10-CM | POA: Diagnosis not present

## 2012-04-10 DIAGNOSIS — L821 Other seborrheic keratosis: Secondary | ICD-10-CM | POA: Diagnosis not present

## 2012-04-10 DIAGNOSIS — L0293 Carbuncle, unspecified: Secondary | ICD-10-CM | POA: Diagnosis not present

## 2012-04-10 DIAGNOSIS — C44320 Squamous cell carcinoma of skin of unspecified parts of face: Secondary | ICD-10-CM | POA: Diagnosis not present

## 2012-04-10 DIAGNOSIS — L570 Actinic keratosis: Secondary | ICD-10-CM | POA: Diagnosis not present

## 2012-04-10 DIAGNOSIS — D239 Other benign neoplasm of skin, unspecified: Secondary | ICD-10-CM | POA: Diagnosis not present

## 2012-05-11 DIAGNOSIS — C44320 Squamous cell carcinoma of skin of unspecified parts of face: Secondary | ICD-10-CM | POA: Diagnosis not present

## 2012-05-19 DIAGNOSIS — E559 Vitamin D deficiency, unspecified: Secondary | ICD-10-CM | POA: Diagnosis not present

## 2012-05-19 DIAGNOSIS — E119 Type 2 diabetes mellitus without complications: Secondary | ICD-10-CM | POA: Diagnosis not present

## 2012-05-19 DIAGNOSIS — I10 Essential (primary) hypertension: Secondary | ICD-10-CM | POA: Diagnosis not present

## 2012-05-19 DIAGNOSIS — R5383 Other fatigue: Secondary | ICD-10-CM | POA: Diagnosis not present

## 2012-05-19 DIAGNOSIS — R5381 Other malaise: Secondary | ICD-10-CM | POA: Diagnosis not present

## 2012-05-19 DIAGNOSIS — E785 Hyperlipidemia, unspecified: Secondary | ICD-10-CM | POA: Diagnosis not present

## 2012-06-02 DIAGNOSIS — E119 Type 2 diabetes mellitus without complications: Secondary | ICD-10-CM | POA: Diagnosis not present

## 2012-06-02 DIAGNOSIS — E1149 Type 2 diabetes mellitus with other diabetic neurological complication: Secondary | ICD-10-CM | POA: Diagnosis not present

## 2012-06-18 DIAGNOSIS — E119 Type 2 diabetes mellitus without complications: Secondary | ICD-10-CM | POA: Diagnosis not present

## 2012-06-25 DIAGNOSIS — N393 Stress incontinence (female) (male): Secondary | ICD-10-CM | POA: Diagnosis not present

## 2012-06-25 DIAGNOSIS — N529 Male erectile dysfunction, unspecified: Secondary | ICD-10-CM | POA: Diagnosis not present

## 2012-06-25 DIAGNOSIS — R5383 Other fatigue: Secondary | ICD-10-CM | POA: Diagnosis not present

## 2012-06-25 DIAGNOSIS — N35919 Unspecified urethral stricture, male, unspecified site: Secondary | ICD-10-CM | POA: Diagnosis not present

## 2012-06-25 DIAGNOSIS — R5381 Other malaise: Secondary | ICD-10-CM | POA: Diagnosis not present

## 2012-06-25 DIAGNOSIS — E291 Testicular hypofunction: Secondary | ICD-10-CM | POA: Diagnosis not present

## 2012-06-25 DIAGNOSIS — Z8546 Personal history of malignant neoplasm of prostate: Secondary | ICD-10-CM | POA: Diagnosis not present

## 2012-07-20 DIAGNOSIS — E291 Testicular hypofunction: Secondary | ICD-10-CM | POA: Diagnosis not present

## 2012-08-03 ENCOUNTER — Encounter: Payer: Self-pay | Admitting: Cardiology

## 2012-08-03 ENCOUNTER — Ambulatory Visit (INDEPENDENT_AMBULATORY_CARE_PROVIDER_SITE_OTHER): Payer: Medicare Other | Admitting: Cardiology

## 2012-08-03 VITALS — BP 127/78 | HR 73 | Ht 71.0 in | Wt 232.0 lb

## 2012-08-03 DIAGNOSIS — E7800 Pure hypercholesterolemia, unspecified: Secondary | ICD-10-CM

## 2012-08-03 DIAGNOSIS — G4733 Obstructive sleep apnea (adult) (pediatric): Secondary | ICD-10-CM | POA: Diagnosis not present

## 2012-08-03 DIAGNOSIS — I2510 Atherosclerotic heart disease of native coronary artery without angina pectoris: Secondary | ICD-10-CM

## 2012-08-03 NOTE — Progress Notes (Signed)
   HPI:  He really is getting along quite well.  He is tolerating CPAP.  No change in patterns of rare occasional discomfort.    Current Outpatient Prescriptions  Medication Sig Dispense Refill  . amLODipine (NORVASC) 5 MG tablet Take 1 tablet (5 mg total) by mouth daily.  90 tablet  3  . atorvastatin (LIPITOR) 40 MG tablet Take 1 tablet (40 mg total) by mouth daily.  90 tablet  3  . Cyanocobalamin (VITAMIN B-12 IJ) Inject as directed. Take 1 injection every once a week      . DEPO-TESTOSTERONE 200 MG/ML injection 1 injection every two weeks      . insulin glargine (LANTUS SOLOSTAR) 100 UNIT/ML injection Inject 50 Units into the skin at bedtime.        [name] lisinopril (PRINIVIL,ZESTRIL) 40 MG tablet daily      . metFORMIN (GLUCOPHAGE) 1000 MG tablet Take 1,000 mg by mouth 2 (two) times daily with a meal.        . [DISCONTINUED] insulin aspart (NOVOLOG) 100 UNIT/ML injection Inject 20 Units into the skin 3 (three) times daily before meals.        . [DISCONTINUED] rosuvastatin (CRESTOR) 40 MG tablet Take 40 mg by mouth daily.          Allergies  Allergen Reactions  . Bee Venom   . Codeine   . Latex   . Povidone   . Simvastatin     Past Medical History  Diagnosis Date  . Diabetes mellitus   . Coronary artery disease   . Hypercholesterolemia   . PVD (peripheral vascular disease)   . Hypertension   . Prostate cancer     Past Surgical History  Procedure Date  . Tonsillectomy   . Back surgey   . Back surgery   . Vasectomy   . Knee surgery     right  . Shoulder surgery   . Prostatectomy     Family History  Problem Relation Age of Onset  . Colon cancer Brother     History   Social History  . Marital Status: Married    Spouse Name: N/A    Number of Children: N/A  . Years of Education: N/A   Occupational History  . retired    Social History Main Topics  . Smoking status: Never Smoker   . Smokeless tobacco: Not on file  . Alcohol Use: No  . Drug Use: No  .  Sexually Active: Not on file   Other Topics Concern  . Not on file   Social History Narrative  . No narrative on file    ROS: Please see the HPI.  All other systems reviewed and negative.  PHYSICAL EXAM:  BP 127/78  Pulse 73  Ht 5\' 11"  (1.803 m)  Wt 232 lb (105.235 kg)  BMI 32.36 kg/m2  General: Well developed, well nourished, in no acute distress. Head:  Normocephalic and atraumatic. Neck: no JVD Lungs: Clear to auscultation and percussion. Heart: Normal S1 and S2.  No murmur, rubs or gallops.  Abdomen:  Normal bowel sounds; soft; non tender; no organomegaly Pulses: Pulses normal in all 4 extremities. Extremities: No clubbing or cyanosis. No edema. Neurologic: Alert and oriented x 3.  EKG:  ASSESSMENT AND PLAN:

## 2012-08-03 NOTE — Patient Instructions (Addendum)
Your physician wants you to follow-up in: 6 MONTHS with Dr Rozann Lesches in [place].  You will receive a reminder letter in the mail two months in advance. If you don't receive a letter, please call our office to schedule the follow-up appointment.  Your physician recommends that you continue on your current medications as directed. Please refer to the Current Medication list given to you today.

## 2012-08-04 DIAGNOSIS — E291 Testicular hypofunction: Secondary | ICD-10-CM | POA: Diagnosis not present

## 2012-08-04 DIAGNOSIS — E538 Deficiency of other specified B group vitamins: Secondary | ICD-10-CM | POA: Diagnosis not present

## 2012-08-11 DIAGNOSIS — E1149 Type 2 diabetes mellitus with other diabetic neurological complication: Secondary | ICD-10-CM | POA: Diagnosis not present

## 2012-08-11 DIAGNOSIS — E119 Type 2 diabetes mellitus without complications: Secondary | ICD-10-CM | POA: Diagnosis not present

## 2012-08-12 DIAGNOSIS — H903 Sensorineural hearing loss, bilateral: Secondary | ICD-10-CM | POA: Diagnosis not present

## 2012-08-12 DIAGNOSIS — H6980 Other specified disorders of Eustachian tube, unspecified ear: Secondary | ICD-10-CM | POA: Diagnosis not present

## 2012-08-15 DIAGNOSIS — G4733 Obstructive sleep apnea (adult) (pediatric): Secondary | ICD-10-CM | POA: Insufficient documentation

## 2012-08-15 NOTE — Assessment & Plan Note (Signed)
Remains on a CPAP machine.

## 2012-08-15 NOTE — Assessment & Plan Note (Signed)
Continued medical therapy with focus on risk reduction in setting of underlying IDDM.  No change.

## 2012-08-15 NOTE — Assessment & Plan Note (Addendum)
Continued medical therapy.  Currently back on statins and being followed by his primary MD.

## 2012-08-17 DIAGNOSIS — L253 Unspecified contact dermatitis due to other chemical products: Secondary | ICD-10-CM | POA: Diagnosis not present

## 2012-08-17 DIAGNOSIS — L570 Actinic keratosis: Secondary | ICD-10-CM | POA: Diagnosis not present

## 2012-08-17 DIAGNOSIS — D239 Other benign neoplasm of skin, unspecified: Secondary | ICD-10-CM | POA: Diagnosis not present

## 2012-08-17 DIAGNOSIS — L738 Other specified follicular disorders: Secondary | ICD-10-CM | POA: Diagnosis not present

## 2012-08-17 DIAGNOSIS — D485 Neoplasm of uncertain behavior of skin: Secondary | ICD-10-CM | POA: Diagnosis not present

## 2012-08-17 DIAGNOSIS — E538 Deficiency of other specified B group vitamins: Secondary | ICD-10-CM | POA: Diagnosis not present

## 2012-08-17 DIAGNOSIS — L905 Scar conditions and fibrosis of skin: Secondary | ICD-10-CM | POA: Diagnosis not present

## 2012-08-17 DIAGNOSIS — E291 Testicular hypofunction: Secondary | ICD-10-CM | POA: Diagnosis not present

## 2012-08-17 DIAGNOSIS — L819 Disorder of pigmentation, unspecified: Secondary | ICD-10-CM | POA: Diagnosis not present

## 2012-08-17 DIAGNOSIS — Z85828 Personal history of other malignant neoplasm of skin: Secondary | ICD-10-CM | POA: Diagnosis not present

## 2012-08-18 DIAGNOSIS — I10 Essential (primary) hypertension: Secondary | ICD-10-CM | POA: Diagnosis not present

## 2012-08-18 DIAGNOSIS — E119 Type 2 diabetes mellitus without complications: Secondary | ICD-10-CM | POA: Diagnosis not present

## 2012-08-31 DIAGNOSIS — E538 Deficiency of other specified B group vitamins: Secondary | ICD-10-CM | POA: Diagnosis not present

## 2012-08-31 DIAGNOSIS — R5381 Other malaise: Secondary | ICD-10-CM | POA: Diagnosis not present

## 2012-08-31 DIAGNOSIS — R5383 Other fatigue: Secondary | ICD-10-CM | POA: Diagnosis not present

## 2012-08-31 DIAGNOSIS — E291 Testicular hypofunction: Secondary | ICD-10-CM | POA: Diagnosis not present

## 2012-09-10 ENCOUNTER — Ambulatory Visit (INDEPENDENT_AMBULATORY_CARE_PROVIDER_SITE_OTHER): Payer: Medicare Other | Admitting: Otolaryngology

## 2012-09-10 DIAGNOSIS — H6980 Other specified disorders of Eustachian tube, unspecified ear: Secondary | ICD-10-CM

## 2012-09-10 DIAGNOSIS — J310 Chronic rhinitis: Secondary | ICD-10-CM

## 2012-09-10 DIAGNOSIS — H903 Sensorineural hearing loss, bilateral: Secondary | ICD-10-CM

## 2012-09-10 DIAGNOSIS — H6990 Unspecified Eustachian tube disorder, unspecified ear: Secondary | ICD-10-CM

## 2012-09-14 DIAGNOSIS — E291 Testicular hypofunction: Secondary | ICD-10-CM | POA: Diagnosis not present

## 2012-09-14 DIAGNOSIS — E538 Deficiency of other specified B group vitamins: Secondary | ICD-10-CM | POA: Diagnosis not present

## 2012-09-23 HISTORY — PX: PORTACATH PLACEMENT: SHX2246

## 2012-09-28 DIAGNOSIS — E538 Deficiency of other specified B group vitamins: Secondary | ICD-10-CM | POA: Diagnosis not present

## 2012-09-28 DIAGNOSIS — E291 Testicular hypofunction: Secondary | ICD-10-CM | POA: Diagnosis not present

## 2012-10-23 DIAGNOSIS — R5381 Other malaise: Secondary | ICD-10-CM | POA: Diagnosis not present

## 2012-10-23 DIAGNOSIS — Z8546 Personal history of malignant neoplasm of prostate: Secondary | ICD-10-CM | POA: Diagnosis not present

## 2012-10-23 DIAGNOSIS — E291 Testicular hypofunction: Secondary | ICD-10-CM | POA: Diagnosis not present

## 2012-10-23 DIAGNOSIS — N393 Stress incontinence (female) (male): Secondary | ICD-10-CM | POA: Diagnosis not present

## 2012-10-23 DIAGNOSIS — C61 Malignant neoplasm of prostate: Secondary | ICD-10-CM | POA: Diagnosis not present

## 2012-10-23 DIAGNOSIS — R5383 Other fatigue: Secondary | ICD-10-CM | POA: Diagnosis not present

## 2012-10-23 DIAGNOSIS — E538 Deficiency of other specified B group vitamins: Secondary | ICD-10-CM | POA: Diagnosis not present

## 2012-10-23 DIAGNOSIS — N35919 Unspecified urethral stricture, male, unspecified site: Secondary | ICD-10-CM | POA: Diagnosis not present

## 2012-10-23 DIAGNOSIS — N529 Male erectile dysfunction, unspecified: Secondary | ICD-10-CM | POA: Diagnosis not present

## 2012-11-10 DIAGNOSIS — E538 Deficiency of other specified B group vitamins: Secondary | ICD-10-CM | POA: Diagnosis not present

## 2012-11-10 DIAGNOSIS — E291 Testicular hypofunction: Secondary | ICD-10-CM | POA: Diagnosis not present

## 2012-11-12 DIAGNOSIS — D518 Other vitamin B12 deficiency anemias: Secondary | ICD-10-CM | POA: Diagnosis not present

## 2012-11-12 DIAGNOSIS — E291 Testicular hypofunction: Secondary | ICD-10-CM | POA: Diagnosis not present

## 2012-11-12 DIAGNOSIS — I10 Essential (primary) hypertension: Secondary | ICD-10-CM | POA: Diagnosis not present

## 2012-11-12 DIAGNOSIS — E119 Type 2 diabetes mellitus without complications: Secondary | ICD-10-CM | POA: Diagnosis not present

## 2012-11-12 DIAGNOSIS — E785 Hyperlipidemia, unspecified: Secondary | ICD-10-CM | POA: Diagnosis not present

## 2012-11-23 DIAGNOSIS — H01009 Unspecified blepharitis unspecified eye, unspecified eyelid: Secondary | ICD-10-CM | POA: Diagnosis not present

## 2012-11-26 DIAGNOSIS — E291 Testicular hypofunction: Secondary | ICD-10-CM | POA: Diagnosis not present

## 2012-11-26 DIAGNOSIS — E538 Deficiency of other specified B group vitamins: Secondary | ICD-10-CM | POA: Diagnosis not present

## 2012-12-08 ENCOUNTER — Other Ambulatory Visit: Payer: Self-pay | Admitting: *Deleted

## 2012-12-08 MED ORDER — ATORVASTATIN CALCIUM 40 MG PO TABS: 40.0000 mg | ORAL_TABLET | Freq: Every day | ORAL | Status: DC

## 2012-12-10 DIAGNOSIS — E291 Testicular hypofunction: Secondary | ICD-10-CM | POA: Diagnosis not present

## 2012-12-10 DIAGNOSIS — E538 Deficiency of other specified B group vitamins: Secondary | ICD-10-CM | POA: Diagnosis not present

## 2012-12-17 DIAGNOSIS — C44319 Basal cell carcinoma of skin of other parts of face: Secondary | ICD-10-CM | POA: Diagnosis not present

## 2012-12-17 DIAGNOSIS — C4492 Squamous cell carcinoma of skin, unspecified: Secondary | ICD-10-CM | POA: Diagnosis not present

## 2012-12-21 DIAGNOSIS — F411 Generalized anxiety disorder: Secondary | ICD-10-CM | POA: Diagnosis not present

## 2012-12-21 DIAGNOSIS — R112 Nausea with vomiting, unspecified: Secondary | ICD-10-CM | POA: Diagnosis not present

## 2012-12-21 DIAGNOSIS — R252 Cramp and spasm: Secondary | ICD-10-CM | POA: Diagnosis not present

## 2012-12-23 ENCOUNTER — Other Ambulatory Visit: Payer: Self-pay | Admitting: *Deleted

## 2012-12-24 ENCOUNTER — Other Ambulatory Visit: Payer: Self-pay

## 2012-12-24 DIAGNOSIS — E291 Testicular hypofunction: Secondary | ICD-10-CM | POA: Diagnosis not present

## 2012-12-24 DIAGNOSIS — E538 Deficiency of other specified B group vitamins: Secondary | ICD-10-CM | POA: Diagnosis not present

## 2012-12-24 MED ORDER — LISINOPRIL 40 MG PO TABS: 40.0000 mg | ORAL_TABLET | Freq: Every day | ORAL | Status: DC

## 2012-12-24 MED ORDER — AMLODIPINE BESYLATE 5 MG PO TABS: 5.0000 mg | ORAL_TABLET | Freq: Every day | ORAL | Status: DC

## 2012-12-24 MED ORDER — ATORVASTATIN CALCIUM 40 MG PO TABS: 40.0000 mg | ORAL_TABLET | Freq: Every day | ORAL | Status: DC

## 2013-01-02 DIAGNOSIS — H00029 Hordeolum internum unspecified eye, unspecified eyelid: Secondary | ICD-10-CM | POA: Diagnosis not present

## 2013-01-07 DIAGNOSIS — E538 Deficiency of other specified B group vitamins: Secondary | ICD-10-CM | POA: Diagnosis not present

## 2013-01-07 DIAGNOSIS — E291 Testicular hypofunction: Secondary | ICD-10-CM | POA: Diagnosis not present

## 2013-01-12 DIAGNOSIS — E119 Type 2 diabetes mellitus without complications: Secondary | ICD-10-CM | POA: Diagnosis not present

## 2013-01-12 DIAGNOSIS — H04129 Dry eye syndrome of unspecified lacrimal gland: Secondary | ICD-10-CM | POA: Diagnosis not present

## 2013-01-12 DIAGNOSIS — E1149 Type 2 diabetes mellitus with other diabetic neurological complication: Secondary | ICD-10-CM | POA: Diagnosis not present

## 2013-01-21 DIAGNOSIS — E291 Testicular hypofunction: Secondary | ICD-10-CM | POA: Diagnosis not present

## 2013-01-21 DIAGNOSIS — E538 Deficiency of other specified B group vitamins: Secondary | ICD-10-CM | POA: Diagnosis not present

## 2013-02-04 DIAGNOSIS — E291 Testicular hypofunction: Secondary | ICD-10-CM | POA: Diagnosis not present

## 2013-02-12 DIAGNOSIS — I10 Essential (primary) hypertension: Secondary | ICD-10-CM | POA: Diagnosis not present

## 2013-02-12 DIAGNOSIS — E119 Type 2 diabetes mellitus without complications: Secondary | ICD-10-CM | POA: Diagnosis not present

## 2013-02-12 DIAGNOSIS — C61 Malignant neoplasm of prostate: Secondary | ICD-10-CM | POA: Diagnosis not present

## 2013-02-18 DIAGNOSIS — Z85828 Personal history of other malignant neoplasm of skin: Secondary | ICD-10-CM | POA: Diagnosis not present

## 2013-02-18 DIAGNOSIS — L719 Rosacea, unspecified: Secondary | ICD-10-CM | POA: Diagnosis not present

## 2013-02-18 DIAGNOSIS — L723 Sebaceous cyst: Secondary | ICD-10-CM | POA: Diagnosis not present

## 2013-02-18 DIAGNOSIS — E291 Testicular hypofunction: Secondary | ICD-10-CM | POA: Diagnosis not present

## 2013-02-18 DIAGNOSIS — L821 Other seborrheic keratosis: Secondary | ICD-10-CM | POA: Diagnosis not present

## 2013-02-18 DIAGNOSIS — L253 Unspecified contact dermatitis due to other chemical products: Secondary | ICD-10-CM | POA: Diagnosis not present

## 2013-02-18 DIAGNOSIS — L570 Actinic keratosis: Secondary | ICD-10-CM | POA: Diagnosis not present

## 2013-02-18 DIAGNOSIS — E538 Deficiency of other specified B group vitamins: Secondary | ICD-10-CM | POA: Diagnosis not present

## 2013-02-18 DIAGNOSIS — L819 Disorder of pigmentation, unspecified: Secondary | ICD-10-CM | POA: Diagnosis not present

## 2013-02-19 ENCOUNTER — Encounter: Payer: Self-pay | Admitting: Cardiology

## 2013-02-19 ENCOUNTER — Ambulatory Visit (INDEPENDENT_AMBULATORY_CARE_PROVIDER_SITE_OTHER): Payer: Medicare Other | Admitting: Cardiology

## 2013-02-19 VITALS — BP 122/74 | HR 79 | Ht 71.0 in | Wt 232.0 lb

## 2013-02-19 DIAGNOSIS — I2510 Atherosclerotic heart disease of native coronary artery without angina pectoris: Secondary | ICD-10-CM

## 2013-02-19 DIAGNOSIS — I10 Essential (primary) hypertension: Secondary | ICD-10-CM | POA: Diagnosis not present

## 2013-02-19 DIAGNOSIS — G4733 Obstructive sleep apnea (adult) (pediatric): Secondary | ICD-10-CM | POA: Diagnosis not present

## 2013-02-19 DIAGNOSIS — E782 Mixed hyperlipidemia: Secondary | ICD-10-CM

## 2013-02-19 NOTE — Assessment & Plan Note (Signed)
On CPAP. ?

## 2013-02-19 NOTE — Assessment & Plan Note (Signed)
Followed by Dr. Nevada Crane. Will request most recent lab work for review. He reports compliance with Lipitor.

## 2013-02-19 NOTE — Assessment & Plan Note (Signed)
Blood pressure is normal today.

## 2013-02-19 NOTE — Assessment & Plan Note (Signed)
Mild by catheterization in 2008, no active angina symptoms, ECG reviewed and nonspecific. Continue medical therapy and observation. I encouraged him to remain active as tolerated.

## 2013-02-19 NOTE — Patient Instructions (Addendum)
Your physician recommends that you schedule a follow-up appointment in: ONE YEAR WITH SM   

## 2013-02-19 NOTE — Progress Notes (Signed)
Clinical Summary [name] is a 78 y.o.male presenting for routine followup. He is a former patient of Dr. Lia Foyer, last seen in November 2013. Family reports no significant angina symptoms. States that he tries to remain active, has been doing a lot of outdoor lawn mowing recently since his son underwent a hip replacement. He reports compliance with his medications. He just had lab work with Dr. Nevada Crane this past Friday, results are pending for my review.  Echocardiogram from April 2011 noted upper normal LV wall thickness with LVEF 83-72%, grade 2 diastolic dysfunction, trivial aortic regurgitation, mild biatrial enlargement.  Last year his lipid panel revealed total cholesterol 179, triglycerides 81, HDL 51, LDL 112.  Allergies  Allergen Reactions  . Adhesive (Tape)   . Bee Venom   . Ciprofloxacin   . Codeine   . Iodine   . Latex   . Povidone   . Simvastatin     Current Outpatient Prescriptions  Medication Sig Dispense Refill  . amLODipine (NORVASC) 5 MG tablet Take 1 tablet (5 mg total) by mouth daily.  90 tablet  3  . atorvastatin (LIPITOR) 40 MG tablet Take 1 tablet (40 mg total) by mouth daily.  90 tablet  3  . Cyanocobalamin (VITAMIN B-12 IJ) Inject as directed. Take 1 injection every once a week      . DEPO-TESTOSTERONE 200 MG/ML injection 1 injection every two weeks      . insulin glargine (LANTUS SOLOSTAR) 100 UNIT/ML injection Inject 50 Units into the skin at bedtime.        [name] lisinopril (PRINIVIL,ZESTRIL) 40 MG tablet Take 1 tablet (40 mg total) by mouth daily.  90 tablet  3  . metFORMIN (GLUCOPHAGE) 1000 MG tablet Take 1,000 mg by mouth 2 (two) times daily with a meal.        . metroNIDAZOLE (METROCREAM) 0.75 % cream as needed.      . [DISCONTINUED] insulin aspart (NOVOLOG) 100 UNIT/ML injection Inject 20 Units into the skin 3 (three) times daily before meals.        . [DISCONTINUED] rosuvastatin (CRESTOR) 40 MG tablet Take 40 mg by mouth daily.         No current  facility-administered medications for this visit.    Past Medical History  Diagnosis Date  . Type 2 diabetes mellitus   . 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 Mixed hyperlipidemia   . PVD (peripheral vascular disease)   . Essential hypertension, benign   . Prostate cancer   . Obstructive sleep apnea     On CPAP    Social History [name] reports that he has never smoked. He does not have any smokeless tobacco history on file. [name] reports that he does not drink alcohol.  Review of Systems No palpitations, syncope, claudication, bleeding problems. Occasional dependent ankle edema. Otherwise negative.[name]  Physical Examination Filed Vitals:   02/19/13 1405  BP: 122/74  Pulse: 79   Filed Weights   02/19/13 1405  Weight: 232 lb (105.235 kg)   Comfortable at rest. HEENT: Conjunctiva and lids normal, oropharynx clear. Neck: Supple, no elevated JVP or carotid bruits, no thyromegaly. Lungs: Clear to auscultation, nonlabored breathing at rest. Cardiac: Regular rate and rhythm, no S3 or significant systolic murmur. Abdomen: Soft, nontender, bowel sounds present. Extremities: Trace ankle edema, distal pulses 2+. Skin: Warm and dry. Musculoskeletal: No kyphosis. Neuropsychiatric: Alert and  oriented x3, affect grossly appropriate.   Problem List and Plan   Coronary atherosclerosis of native coronary artery Mild by catheterization in 2008, no active angina symptoms, ECG reviewed and nonspecific. Continue medical therapy and observation. I encouraged him to remain active as tolerated.  HYPERTENSION, BENIGN Blood pressure is normal today.  Mixed hyperlipidemia Followed by Dr. Nevada Crane. Will request most recent lab work for review. He reports compliance with Lipitor.  OSA (obstructive sleep apnea) On CPAP.    Satira Sark, M.D., F.A.C.C.

## 2013-03-11 DIAGNOSIS — E291 Testicular hypofunction: Secondary | ICD-10-CM | POA: Diagnosis not present

## 2013-03-11 DIAGNOSIS — E538 Deficiency of other specified B group vitamins: Secondary | ICD-10-CM | POA: Diagnosis not present

## 2013-03-15 DIAGNOSIS — E538 Deficiency of other specified B group vitamins: Secondary | ICD-10-CM | POA: Diagnosis not present

## 2013-03-15 DIAGNOSIS — E291 Testicular hypofunction: Secondary | ICD-10-CM | POA: Diagnosis not present

## 2013-03-23 DIAGNOSIS — E1149 Type 2 diabetes mellitus with other diabetic neurological complication: Secondary | ICD-10-CM | POA: Diagnosis not present

## 2013-03-23 DIAGNOSIS — E119 Type 2 diabetes mellitus without complications: Secondary | ICD-10-CM | POA: Diagnosis not present

## 2013-04-01 DIAGNOSIS — E291 Testicular hypofunction: Secondary | ICD-10-CM | POA: Diagnosis not present

## 2013-04-12 ENCOUNTER — Ambulatory Visit (INDEPENDENT_AMBULATORY_CARE_PROVIDER_SITE_OTHER): Payer: Medicare Other | Admitting: Internal Medicine

## 2013-04-12 ENCOUNTER — Encounter (INDEPENDENT_AMBULATORY_CARE_PROVIDER_SITE_OTHER): Payer: Self-pay | Admitting: Internal Medicine

## 2013-04-12 VITALS — BP 110/72 | HR 68 | Temp 97.9°F | Resp 18 | Ht 71.0 in | Wt 231.8 lb

## 2013-04-12 DIAGNOSIS — Z8719 Personal history of other diseases of the digestive system: Secondary | ICD-10-CM

## 2013-04-12 DIAGNOSIS — K625 Hemorrhage of anus and rectum: Secondary | ICD-10-CM

## 2013-04-12 NOTE — Progress Notes (Signed)
Presenting complaint;  Rectal bleeding.  Subjective:  Patient is 78 year old Caucasian male who is in for scheduled visit. He has history of ulcerative colitis and was last seen in March 2012. He noted bright red blood with his bowel movements about 4 months ago. He describes it to be small to moderate amount. He had few episodes intermittently. He did not experience diarrhea fever or chills. He says he could not get earlier appointment. His bleeding has stopped. He has not passed any blood for 3-4 weeks. He recalls he did take and says to 3 weeks from this bleeding occurred. He has formed stools daily. He has good appetite. He has lost a few pounds since his last visit. He has occasional pain in left low quadrant of his abdomen for which he takes anacin once or twice. He is watching his diet. He rarely experiences heartburn. He has occasional dysphagia with solids but it is not progressing. He has not experienced any episode of food impaction.  Current Medications: Current Outpatient Prescriptions  Medication Sig Dispense Refill  . amLODipine (NORVASC) 5 MG tablet Take 1 tablet (5 mg total) by mouth daily.  90 tablet  3  . atorvastatin (LIPITOR) 40 MG tablet Take 1 tablet (40 mg total) by mouth daily.  90 tablet  3  . Cyanocobalamin (VITAMIN B-12 IJ) Inject as directed every 30 (thirty) days. Next injection is the 24 th of July      . DEPO-TESTOSTERONE 200 MG/ML injection 1 injection every two weeks      . insulin glargine (LANTUS SOLOSTAR) 100 UNIT/ML injection Inject 50 Units into the skin every morning.       [name] lisinopril (PRINIVIL,ZESTRIL) 40 MG tablet Take 1 tablet (40 mg total) by mouth daily.  90 tablet  3  . metFORMIN (GLUCOPHAGE) 1000 MG tablet Take 1,000 mg by mouth 2 (two) times daily with a meal.        . metroNIDAZOLE (METROCREAM) 0.75 % cream as needed.      . [DISCONTINUED] insulin aspart (NOVOLOG) 100 UNIT/ML injection Inject 20 Units into the skin 3 (three) times daily before  meals.        . [DISCONTINUED] rosuvastatin (CRESTOR) 40 MG tablet Take 40 mg by mouth daily.         No current facility-administered medications for this visit.     Objective: Blood pressure 110/72, pulse 68, temperature 97.9 F (36.6 C), temperature source Oral, resp. rate 18, height 5' 11"  (1.803 m), weight 231 lb 12.8 oz (105.144 kg). Patient is alert and in no acute distress. Conjunctiva is pink. Sclera is nonicteric Oropharyngeal mucosa is normal. No neck masses or thyromegaly noted. Cardiac exam with regular rhythm normal S1 and S2. No murmur or gallop noted. Lungs are clear to auscultation. Abdomen is symmetrical soft with mild tenderness at RUQ. No organomegaly or masses. Rectal examination reveals scant amount of stool in the vault which is guaiac-negative.  No LE edema or clubbing noted.   Assessment:  #1. Rectal bleeding. It has resolved and his stool is guaiac negative. He has history of ulcerative colitis and appears to be in remission. His last colonoscopy was in June 2011 revealing focal colitis the sigmoid colon. There was no evidence of radiation proctitis on that exam. If he has further episodes of rectal bleeding he will need further evaluation. He is presently on no medications. Disease duration 14 years. #2. Dysphagia. Last EGD was in June 2011 in his esophagus was dilated to [place]  dilator. No structural abnormality was identified though. If dysphagia progresses will consider BPE. #3. LLQ abdominal pain felt to be secondary to IBS. Pain is sporadic. Will monitor.    Plan: Next colonoscopy in 2 years. Hemoccult x 1. Patient will call if rectal bleeding recurs or dysphagia progresses. Office visit in one year.

## 2013-04-12 NOTE — Patient Instructions (Addendum)
Please forward [place] copy of your next blood work. Heme occult x 1 Call if rectal bleeding recurs

## 2013-04-15 DIAGNOSIS — E538 Deficiency of other specified B group vitamins: Secondary | ICD-10-CM | POA: Diagnosis not present

## 2013-04-15 DIAGNOSIS — E291 Testicular hypofunction: Secondary | ICD-10-CM | POA: Diagnosis not present

## 2013-04-22 DIAGNOSIS — R7301 Impaired fasting glucose: Secondary | ICD-10-CM | POA: Diagnosis not present

## 2013-04-22 DIAGNOSIS — E119 Type 2 diabetes mellitus without complications: Secondary | ICD-10-CM | POA: Diagnosis not present

## 2013-04-22 DIAGNOSIS — I10 Essential (primary) hypertension: Secondary | ICD-10-CM | POA: Diagnosis not present

## 2013-04-22 DIAGNOSIS — E785 Hyperlipidemia, unspecified: Secondary | ICD-10-CM | POA: Diagnosis not present

## 2013-04-23 DIAGNOSIS — N35919 Unspecified urethral stricture, male, unspecified site: Secondary | ICD-10-CM | POA: Diagnosis not present

## 2013-04-23 DIAGNOSIS — C61 Malignant neoplasm of prostate: Secondary | ICD-10-CM | POA: Diagnosis not present

## 2013-04-23 DIAGNOSIS — Z8546 Personal history of malignant neoplasm of prostate: Secondary | ICD-10-CM | POA: Diagnosis not present

## 2013-04-23 DIAGNOSIS — N393 Stress incontinence (female) (male): Secondary | ICD-10-CM | POA: Diagnosis not present

## 2013-04-23 DIAGNOSIS — N529 Male erectile dysfunction, unspecified: Secondary | ICD-10-CM | POA: Diagnosis not present

## 2013-04-23 DIAGNOSIS — E291 Testicular hypofunction: Secondary | ICD-10-CM | POA: Diagnosis not present

## 2013-04-23 DIAGNOSIS — E538 Deficiency of other specified B group vitamins: Secondary | ICD-10-CM | POA: Diagnosis not present

## 2013-04-30 DIAGNOSIS — E291 Testicular hypofunction: Secondary | ICD-10-CM | POA: Diagnosis not present

## 2013-05-13 ENCOUNTER — Encounter (INDEPENDENT_AMBULATORY_CARE_PROVIDER_SITE_OTHER): Payer: Self-pay

## 2013-05-13 DIAGNOSIS — E538 Deficiency of other specified B group vitamins: Secondary | ICD-10-CM | POA: Diagnosis not present

## 2013-05-13 DIAGNOSIS — E291 Testicular hypofunction: Secondary | ICD-10-CM | POA: Diagnosis not present

## 2013-05-21 DIAGNOSIS — E291 Testicular hypofunction: Secondary | ICD-10-CM | POA: Diagnosis not present

## 2013-05-21 DIAGNOSIS — E785 Hyperlipidemia, unspecified: Secondary | ICD-10-CM | POA: Diagnosis not present

## 2013-05-21 DIAGNOSIS — E119 Type 2 diabetes mellitus without complications: Secondary | ICD-10-CM | POA: Diagnosis not present

## 2013-05-21 DIAGNOSIS — C61 Malignant neoplasm of prostate: Secondary | ICD-10-CM | POA: Diagnosis not present

## 2013-05-27 DIAGNOSIS — E291 Testicular hypofunction: Secondary | ICD-10-CM | POA: Diagnosis not present

## 2013-05-27 DIAGNOSIS — E538 Deficiency of other specified B group vitamins: Secondary | ICD-10-CM | POA: Diagnosis not present

## 2013-06-07 DIAGNOSIS — E1139 Type 2 diabetes mellitus with other diabetic ophthalmic complication: Secondary | ICD-10-CM | POA: Diagnosis not present

## 2013-06-07 DIAGNOSIS — E11319 Type 2 diabetes mellitus with unspecified diabetic retinopathy without macular edema: Secondary | ICD-10-CM | POA: Diagnosis not present

## 2013-06-10 DIAGNOSIS — E291 Testicular hypofunction: Secondary | ICD-10-CM | POA: Diagnosis not present

## 2013-06-24 DIAGNOSIS — E538 Deficiency of other specified B group vitamins: Secondary | ICD-10-CM | POA: Diagnosis not present

## 2013-06-24 DIAGNOSIS — E291 Testicular hypofunction: Secondary | ICD-10-CM | POA: Diagnosis not present

## 2013-07-05 DIAGNOSIS — Z23 Encounter for immunization: Secondary | ICD-10-CM | POA: Diagnosis not present

## 2013-07-08 DIAGNOSIS — E291 Testicular hypofunction: Secondary | ICD-10-CM | POA: Diagnosis not present

## 2013-07-08 DIAGNOSIS — E538 Deficiency of other specified B group vitamins: Secondary | ICD-10-CM | POA: Diagnosis not present

## 2013-07-22 DIAGNOSIS — E291 Testicular hypofunction: Secondary | ICD-10-CM | POA: Diagnosis not present

## 2013-08-05 DIAGNOSIS — E291 Testicular hypofunction: Secondary | ICD-10-CM | POA: Diagnosis not present

## 2013-08-06 DIAGNOSIS — E1149 Type 2 diabetes mellitus with other diabetic neurological complication: Secondary | ICD-10-CM | POA: Diagnosis not present

## 2013-08-06 DIAGNOSIS — B351 Tinea unguium: Secondary | ICD-10-CM | POA: Diagnosis not present

## 2013-08-06 DIAGNOSIS — L851 Acquired keratosis [keratoderma] palmaris et plantaris: Secondary | ICD-10-CM | POA: Diagnosis not present

## 2013-08-11 ENCOUNTER — Encounter (INDEPENDENT_AMBULATORY_CARE_PROVIDER_SITE_OTHER): Payer: Self-pay

## 2013-08-11 ENCOUNTER — Telehealth (INDEPENDENT_AMBULATORY_CARE_PROVIDER_SITE_OTHER): Payer: Self-pay | Admitting: *Deleted

## 2013-08-11 ENCOUNTER — Other Ambulatory Visit (INDEPENDENT_AMBULATORY_CARE_PROVIDER_SITE_OTHER): Payer: Self-pay | Admitting: *Deleted

## 2013-08-11 DIAGNOSIS — R1310 Dysphagia, unspecified: Secondary | ICD-10-CM

## 2013-08-11 DIAGNOSIS — G8929 Other chronic pain: Secondary | ICD-10-CM

## 2013-08-11 DIAGNOSIS — R195 Other fecal abnormalities: Secondary | ICD-10-CM

## 2013-08-11 DIAGNOSIS — Z1211 Encounter for screening for malignant neoplasm of colon: Secondary | ICD-10-CM

## 2013-08-11 MED ORDER — PEG-KCL-NACL-NASULF-NA ASC-C 100 G PO SOLR: 1.0000 | Freq: Once | ORAL | Status: DC

## 2013-08-11 NOTE — Telephone Encounter (Signed)
Patient needs movi prep 

## 2013-08-13 ENCOUNTER — Encounter (HOSPITAL_COMMUNITY): Payer: Self-pay | Admitting: Pharmacy Technician

## 2013-08-23 DIAGNOSIS — E291 Testicular hypofunction: Secondary | ICD-10-CM | POA: Diagnosis not present

## 2013-08-23 DIAGNOSIS — E538 Deficiency of other specified B group vitamins: Secondary | ICD-10-CM | POA: Diagnosis not present

## 2013-08-26 ENCOUNTER — Other Ambulatory Visit: Payer: Self-pay | Admitting: Dermatology

## 2013-08-26 DIAGNOSIS — L821 Other seborrheic keratosis: Secondary | ICD-10-CM | POA: Diagnosis not present

## 2013-08-26 DIAGNOSIS — L820 Inflamed seborrheic keratosis: Secondary | ICD-10-CM | POA: Diagnosis not present

## 2013-08-26 DIAGNOSIS — L819 Disorder of pigmentation, unspecified: Secondary | ICD-10-CM | POA: Diagnosis not present

## 2013-08-26 DIAGNOSIS — L219 Seborrheic dermatitis, unspecified: Secondary | ICD-10-CM | POA: Diagnosis not present

## 2013-08-26 DIAGNOSIS — Z85828 Personal history of other malignant neoplasm of skin: Secondary | ICD-10-CM | POA: Diagnosis not present

## 2013-08-26 DIAGNOSIS — D485 Neoplasm of uncertain behavior of skin: Secondary | ICD-10-CM | POA: Diagnosis not present

## 2013-08-26 DIAGNOSIS — L259 Unspecified contact dermatitis, unspecified cause: Secondary | ICD-10-CM | POA: Diagnosis not present

## 2013-08-27 ENCOUNTER — Ambulatory Visit (HOSPITAL_COMMUNITY)
Admission: RE | Admit: 2013-08-27 | Discharge: 2013-08-27 | Disposition: A | Discharge status: Home / Self Care | Payer: Medicare Other | Source: Ambulatory Visit | Attending: Internal Medicine | Admitting: Internal Medicine

## 2013-08-27 ENCOUNTER — Encounter (HOSPITAL_COMMUNITY)
Admission: RE | Disposition: A | Discharge status: Home / Self Care | Payer: Self-pay | Source: Ambulatory Visit | Attending: Internal Medicine

## 2013-08-27 DIAGNOSIS — K644 Residual hemorrhoidal skin tags: Secondary | ICD-10-CM | POA: Diagnosis not present

## 2013-08-27 DIAGNOSIS — K2960 Other gastritis without bleeding: Secondary | ICD-10-CM | POA: Diagnosis not present

## 2013-08-27 DIAGNOSIS — K5289 Other specified noninfective gastroenteritis and colitis: Secondary | ICD-10-CM | POA: Insufficient documentation

## 2013-08-27 DIAGNOSIS — C8589 Other specified types of non-Hodgkin lymphoma, extranodal and solid organ sites: Secondary | ICD-10-CM | POA: Diagnosis not present

## 2013-08-27 DIAGNOSIS — R109 Unspecified abdominal pain: Secondary | ICD-10-CM

## 2013-08-27 DIAGNOSIS — I10 Essential (primary) hypertension: Secondary | ICD-10-CM | POA: Insufficient documentation

## 2013-08-27 DIAGNOSIS — K5730 Diverticulosis of large intestine without perforation or abscess without bleeding: Secondary | ICD-10-CM

## 2013-08-27 DIAGNOSIS — R1310 Dysphagia, unspecified: Secondary | ICD-10-CM | POA: Diagnosis not present

## 2013-08-27 DIAGNOSIS — E119 Type 2 diabetes mellitus without complications: Secondary | ICD-10-CM | POA: Diagnosis not present

## 2013-08-27 DIAGNOSIS — R195 Other fecal abnormalities: Secondary | ICD-10-CM

## 2013-08-27 DIAGNOSIS — C8319 Mantle cell lymphoma, extranodal and solid organ sites: Secondary | ICD-10-CM | POA: Diagnosis not present

## 2013-08-27 DIAGNOSIS — K921 Melena: Secondary | ICD-10-CM | POA: Diagnosis not present

## 2013-08-27 DIAGNOSIS — Z01812 Encounter for preprocedural laboratory examination: Secondary | ICD-10-CM | POA: Diagnosis not present

## 2013-08-27 DIAGNOSIS — G8929 Other chronic pain: Secondary | ICD-10-CM

## 2013-08-27 HISTORY — PX: COLONOSCOPY WITH ESOPHAGOGASTRODUODENOSCOPY (EGD): SHX5779

## 2013-08-27 HISTORY — PX: BALLOON DILATION: SHX5330

## 2013-08-27 HISTORY — PX: MALONEY DILATION: SHX5535

## 2013-08-27 HISTORY — PX: SAVORY DILATION: SHX5439

## 2013-08-27 SURGERY — COLONOSCOPY WITH ESOPHAGOGASTRODUODENOSCOPY (EGD)
Anesthesia: Moderate Sedation

## 2013-08-27 MED ORDER — PANTOPRAZOLE SODIUM 40 MG PO TBEC: 40.0000 mg | DELAYED_RELEASE_TABLET | Freq: Every day | ORAL | Status: DC

## 2013-08-27 MED ORDER — SODIUM CHLORIDE 0.9 % IV SOLN
INTRAVENOUS | Status: DC
  Administered 2013-08-27: 1000 mL via INTRAVENOUS

## 2013-08-27 MED ORDER — STERILE WATER FOR IRRIGATION IR SOLN
Status: DC | PRN
  Administered 2013-08-27: 08:00:00

## 2013-08-27 MED ORDER — BUTAMBEN-TETRACAINE-BENZOCAINE 2-2-14 % EX AERO
INHALATION_SPRAY | CUTANEOUS | Status: DC | PRN
  Administered 2013-08-27: 2 via TOPICAL

## 2013-08-27 MED ORDER — MEPERIDINE HCL 50 MG/ML IJ SOLN
INTRAMUSCULAR | Status: DC | PRN
  Administered 2013-08-27 (×2): 25 mg via INTRAVENOUS

## 2013-08-27 MED ORDER — MEPERIDINE HCL 50 MG/ML IJ SOLN
INTRAMUSCULAR | Status: AC
  Filled 2013-08-27: qty 1

## 2013-08-27 MED ORDER — MIDAZOLAM HCL 5 MG/5ML IJ SOLN
INTRAMUSCULAR | Status: DC | PRN
  Administered 2013-08-27 (×4): 2 mg via INTRAVENOUS

## 2013-08-27 MED ORDER — MIDAZOLAM HCL 5 MG/5ML IJ SOLN
INTRAMUSCULAR | Status: AC
  Filled 2013-08-27: qty 10

## 2013-08-27 NOTE — Op Note (Signed)
EGD PROCEDURE REPORT  PATIENT:  [name]  MR#:  [id number] Birthdate:  Dec 14, 1934, 78 y.o., male Endoscopist:  Dr. Rogene Houston, MD Referred By:  Dr. Wende Neighbors, MD  Procedure Date: 08/27/2013  Procedure:   EGD, ED & Colonoscopy  Indications:  Patient is 78 year old Caucasian male who has history of GERD presents with dysphagia primarily to solids. He also has recurrent hematochezia lower abdominal pain. He also gives history of single episode of melena earlier this week. He has history of ulcerative colitis but currently on no medications. Down the street is positive for colon carcinoma in his brother who was 33 at the time of diagnosis. Patient's last colonoscopy was in June 2011.            Informed Consent:  The risks, benefits, alternatives & imponderables which include, but are not limited to, bleeding, infection, perforation, drug reaction and potential missed lesion have been reviewed.  The potential for biopsy, lesion removal, esophageal dilation, etc. have also been discussed.  Questions have been answered.  All parties agreeable.  Please see history & physical in medical record for more information.  Medications:  Demerol 50 mg IV Versed 8 mg IV Cetacaine spray topically for oropharyngeal anesthesia  EGD  Description of procedure:  The endoscope was introduced through the mouth and advanced to the second portion of the duodenum without difficulty or limitations. The mucosal surfaces were surveyed very carefully during advancement of the scope and upon withdrawal.  Findings:  Esophagus:  Mucosa of the esophagus was normal except there was a small trochanter ulcer at distal esophagus just above GE junction. Baby GE junction with edema and erythema noted on the gastric side. No obvious stricture or ring noted. GEJ:  40 cm Stomach:  Stomach was empty and distended very well with insufflation. Folds in the proximal stomach are normal. Examination mucosa and body was normal.  Patchy erythema and few erosions noted at antrum. Pyloric channel was patent. And rest fundus and cardia were examined by retroflexing in the scope and were normal. Duodenum:  Normal bulbar and post bulbar mucosa.  Therapeutic/Diagnostic Maneuvers Performed:   Esophagus was dilated by passing 56 [place] Maloney dilator insertion but no mucosal destruction induced.  COLONOSCOPY Description of procedure:  After a digital rectal exam was performed, that colonoscope was advanced from the anus through the rectum and colon to the area of the cecum, ileocecal valve and appendiceal orifice. The cecum was deeply intubated. These structures were well-seen and photographed for the record. From the level of the cecum and ileocecal valve, the scope was slowly and cautiously withdrawn. The mucosal surfaces were carefully surveyed utilizing scope tip to flexion to facilitate fold flattening as needed. The scope was pulled down into the rectum where a thorough exam including retroflexion was performed.  Findings:   Prep satisfactory. Fine patchy nodularity noted to mucosa of ascending transverse and descending colon. Random biopsies taken. Patchy erythema edema and punctate erosions voluming 15 cm segment of mid sigmoid colon. Biopsy taken. Mucosa of distal sigmoid was normal. Few scattered diverticula at sigmoid colon. Normal rectal mucosa. Single anal papilla and small hemorrhoids below the dentate line.  Therapeutic/Diagnostic Maneuvers Performed:  See above  Complications:  None  Cecal Withdrawal Time:  20 minutes  Impression:  EGD findings; Single small ulcer at distal esophagus along with erythema and edema to mucosa at GE junction. These changes are secondary to reflux esophagitis. Erosive antral gastritis. Esophagus dilated by passing 56 [place] Maloney dilator  but no mucosal destruction induced. 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 Biopsy taken. Segmental colitis involving mid sigmoid colon. Biopsy taken. Mild sigmoid colon diverticulosis. External hemorrhoids and single anal papilla.  Recommendations:  Pantoprazole 40 mg by mouth every morning. I will be contacting patient results of biopsy and further recommendations.  REHMAN,NAJEEB U  08/27/2013 8:40 AM  CC: Dr. [name], MD & Dr. Rayne Du ref. provider found

## 2013-08-27 NOTE — H&P (Signed)
[name] is an 78 y.o. male.   Chief Complaint: Patient is here for EGD, ED and colonoscopy. HPI: Patient is 78 year old Caucasian male who presents with recurrent hematochezia. He generally passes bright red blood with his bowel movements. 3 days ago he had an episode of black stool. His heart and controller therapy. He has sporadic nausea much less and he used to have. He has dysphasia primarily to solids but taking liquids. She denies vomiting. He also complains of pain across his lower abdomen more on the left side. This pain is not relieved with defecation or any voids. He has lost 10 pounds this year but he is not trying to. He does not take NSAIDs. Last colonoscopy was in June 20 11th revealing focal colitis at sigmoid colon He also has history of ulcerative colitis and has been off medications for 2 years. He also has history of radiation proctitis. Family history is positive for colon carcinoma and a brother who is 60 at the time of diagnosis and lived to be 79. He died of metastatic disease. Another brother died of brain cancer at age 40.  Past Medical History  Diagnosis Date  . Type 2 diabetes mellitus   . 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 Mixed hyperlipidemia   . PVD (peripheral vascular disease)   . Essential hypertension, benign   . Prostate cancer   . Obstructive sleep apnea     On CPAP    Past Surgical History  Procedure Laterality Date  . Tonsillectomy    . Back surgery    . Vasectomy    . Knee surgery      Right  . Shoulder surgery    . Prostatectomy      Family History  Problem Relation Age of Onset  . Colon cancer Brother    Social History:  reports that he has never smoked. He has never used smokeless tobacco. He reports that he does not drink alcohol or use illicit drugs.  Allergies:  Allergies  Allergen Reactions  . Adhesive [Tape]   . Bee  Venom   . Ciprofloxacin   . Codeine   . Iodine   . Latex   . Povidone   . Simvastatin     Medications Prior to Admission  Medication Sig Dispense Refill  . amLODipine (NORVASC) 5 MG tablet Take 1 tablet (5 mg total) by mouth daily.  90 tablet  3  . atorvastatin (LIPITOR) 40 MG tablet Take 1 tablet (40 mg total) by mouth daily.  90 tablet  3  . Cyanocobalamin (VITAMIN B-12 IJ) Inject as directed every 30 (thirty) days.       [name] DEPO-TESTOSTERONE 200 MG/ML injection 200 mg. 1 injection every two weeks      . insulin glargine (LANTUS SOLOSTAR) 100 UNIT/ML injection Inject 50 Units into the skin every morning.       [name] lisinopril (PRINIVIL,ZESTRIL) 40 MG tablet Take 1 tablet (40 mg total) by mouth daily.  90 tablet  3  . metFORMIN (GLUCOPHAGE) 1000 MG tablet Take 1,000 mg by mouth 2 (two) times daily with a meal.          No results found for this or any previous visit (from the past 48 hour(s)). No results found.  ROS  Blood pressure 129/63, pulse 77, temperature 98.2 F (36.8 C), temperature source Oral, resp. rate 18, SpO2 95.00%. Physical Exam  Constitutional: He appears well-developed and well-nourished.  HENT:  Mouth/Throat: Oropharynx is clear and moist.  Eyes: Conjunctivae are normal. No scleral icterus.  Neck: No thyromegaly present.  Cardiovascular: Normal rate, regular rhythm and normal heart sounds.   No murmur heard. Respiratory: Effort normal and breath sounds normal.  GI: Soft. He exhibits no mass. Tenderness: mild tenderness across lower abdomen without guarding.  Musculoskeletal: He exhibits no edema.  Lymphadenopathy:    He has no cervical adenopathy.  Neurological: He is alert.  Skin: Skin is warm and dry.     Assessment/Plan Dysphagia. Chronic GERD. Recurrent hematochezia in the patient history of ulcerative colitis. Single episode of melena. Family history of colon carcinoma. EGD, ED and colonoscopy.  REHMAN,NAJEEB U 08/27/2013, 7:33 AM

## 2013-09-01 ENCOUNTER — Encounter (HOSPITAL_COMMUNITY): Payer: Self-pay | Admitting: Internal Medicine

## 2013-09-02 ENCOUNTER — Encounter (INDEPENDENT_AMBULATORY_CARE_PROVIDER_SITE_OTHER): Payer: Self-pay | Admitting: *Deleted

## 2013-09-02 ENCOUNTER — Other Ambulatory Visit (HOSPITAL_COMMUNITY): Payer: Self-pay | Admitting: Oncology

## 2013-09-02 DIAGNOSIS — C8310 Mantle cell lymphoma, unspecified site: Secondary | ICD-10-CM

## 2013-09-03 ENCOUNTER — Encounter (HOSPITAL_COMMUNITY): Payer: Medicare Other | Attending: Hematology and Oncology

## 2013-09-03 DIAGNOSIS — C8319 Mantle cell lymphoma, extranodal and solid organ sites: Secondary | ICD-10-CM | POA: Diagnosis not present

## 2013-09-03 DIAGNOSIS — C8310 Mantle cell lymphoma, unspecified site: Secondary | ICD-10-CM

## 2013-09-03 LAB — COMPREHENSIVE METABOLIC PANEL
ALT: 16 U/L (ref 0–53)
Albumin: 3.9 g/dL (ref 3.5–5.2)
Alkaline Phosphatase: 53 U/L (ref 39–117)
CO2: 30 mEq/L (ref 19–32)
Chloride: 97 mEq/L (ref 96–112)
GFR calc non Af Amer: 56 mL/min — ABNORMAL LOW (ref >90)
Potassium: 3.8 mEq/L (ref 3.5–5.1)
Sodium: 138 mEq/L (ref 135–145)
Total Protein: 7.2 g/dL (ref 6.0–8.3)

## 2013-09-03 LAB — CBC WITH DIFFERENTIAL/PLATELET
Basophils Absolute: 0 10*3/uL (ref 0.0–0.1)
Basophils Relative: 1 % (ref 0–1)
Eosinophils Absolute: 0.1 10*3/uL (ref 0.0–0.7)
Lymphocytes Relative: 25 % (ref 12–46)
MCH: 30.2 pg (ref 26.0–34.0)
MCHC: 33.5 g/dL (ref 30.0–36.0)
Monocytes Absolute: 0.4 10*3/uL (ref 0.1–1.0)
Neutrophils Relative %: 66 % (ref 43–77)
Platelets: 137 10*3/uL — ABNORMAL LOW (ref 150–400)

## 2013-09-03 LAB — LACTATE DEHYDROGENASE: LDH: 136 U/L (ref 94–250)

## 2013-09-03 NOTE — Progress Notes (Signed)
Labs drawn today for cbc/diff,cmp,[id number]

## 2013-09-06 ENCOUNTER — Ambulatory Visit (HOSPITAL_COMMUNITY)
Admission: RE | Admit: 2013-09-06 | Discharge: 2013-09-06 | Disposition: A | Discharge status: Home / Self Care | Payer: Medicare Other | Source: Ambulatory Visit | Attending: Oncology | Admitting: Oncology

## 2013-09-06 ENCOUNTER — Encounter (INDEPENDENT_AMBULATORY_CARE_PROVIDER_SITE_OTHER): Payer: Self-pay | Admitting: *Deleted

## 2013-09-06 ENCOUNTER — Ambulatory Visit (HOSPITAL_COMMUNITY): Admission: RE | Admit: 2013-09-06 | Payer: Medicare Other | Source: Ambulatory Visit

## 2013-09-06 DIAGNOSIS — N289 Disorder of kidney and ureter, unspecified: Secondary | ICD-10-CM | POA: Diagnosis not present

## 2013-09-06 DIAGNOSIS — K5730 Diverticulosis of large intestine without perforation or abscess without bleeding: Secondary | ICD-10-CM | POA: Diagnosis not present

## 2013-09-06 DIAGNOSIS — C8310 Mantle cell lymphoma, unspecified site: Secondary | ICD-10-CM

## 2013-09-06 DIAGNOSIS — C8589 Other specified types of non-Hodgkin lymphoma, extranodal and solid organ sites: Secondary | ICD-10-CM | POA: Diagnosis not present

## 2013-09-06 DIAGNOSIS — R911 Solitary pulmonary nodule: Secondary | ICD-10-CM | POA: Insufficient documentation

## 2013-09-06 DIAGNOSIS — N2889 Other specified disorders of kidney and ureter: Secondary | ICD-10-CM | POA: Diagnosis not present

## 2013-09-06 MED ORDER — IOHEXOL 300 MG/ML  SOLN
100.0000 mL | Freq: Once | INTRAMUSCULAR | Status: AC | PRN
  Administered 2013-09-06: 100 mL via INTRAVENOUS

## 2013-09-07 ENCOUNTER — Encounter (HOSPITAL_COMMUNITY): Payer: Medicare Other

## 2013-09-07 ENCOUNTER — Encounter (HOSPITAL_COMMUNITY): Payer: Self-pay

## 2013-09-07 ENCOUNTER — Encounter (HOSPITAL_BASED_OUTPATIENT_CLINIC_OR_DEPARTMENT_OTHER): Payer: Medicare Other

## 2013-09-07 VITALS — BP 126/76 | HR 77 | Temp 97.8°F | Resp 18 | Ht 69.25 in | Wt 221.3 lb

## 2013-09-07 DIAGNOSIS — C8319 Mantle cell lymphoma, extranodal and solid organ sites: Secondary | ICD-10-CM | POA: Diagnosis not present

## 2013-09-07 DIAGNOSIS — I10 Essential (primary) hypertension: Secondary | ICD-10-CM

## 2013-09-07 DIAGNOSIS — I2510 Atherosclerotic heart disease of native coronary artery without angina pectoris: Secondary | ICD-10-CM

## 2013-09-07 DIAGNOSIS — C8310 Mantle cell lymphoma, unspecified site: Secondary | ICD-10-CM | POA: Insufficient documentation

## 2013-09-07 DIAGNOSIS — Z8719 Personal history of other diseases of the digestive system: Secondary | ICD-10-CM

## 2013-09-07 DIAGNOSIS — K627 Radiation proctitis: Secondary | ICD-10-CM

## 2013-09-07 DIAGNOSIS — Z8546 Personal history of malignant neoplasm of prostate: Secondary | ICD-10-CM

## 2013-09-07 DIAGNOSIS — G4733 Obstructive sleep apnea (adult) (pediatric): Secondary | ICD-10-CM

## 2013-09-07 DIAGNOSIS — C61 Malignant neoplasm of prostate: Secondary | ICD-10-CM | POA: Insufficient documentation

## 2013-09-07 LAB — HEPATITIS B SURFACE ANTIGEN: Hepatitis B Surface Ag: NEGATIVE

## 2013-09-07 MED ORDER — DIAZEPAM 5 MG PO TABS: ORAL_TABLET | ORAL | Status: DC

## 2013-09-07 MED ORDER — OXYCODONE HCL 5 MG PO TABS: 5.0000 mg | ORAL_TABLET | ORAL | Status: DC | PRN

## 2013-09-07 NOTE — Progress Notes (Signed)
[facility] Earl Lites A. Zigmund Daniel, M.D.  NEW PATIENT EVALUATION   Name: [name] Date: 09/07/2013 MRN: [id number] DOB: 1935/01/11  PCP: Catalina Pizza, MD   REFERRING PHYSICIAN: Catalina Pizza, MD  REASON FOR REFERRAL: Recently diagnosed mantle cell lymphoma by colonoscopy.     HISTORY OF PRESENT ILLNESS:[name] is a 78 y.o. male who is referred by his family physician and gastroenterologist for recommendations regarding the management of mantle cell lymphoma diagnosed on colonoscopy. He suffers from intermittent left lower quadrant abdominal pain and did have an episode again of hematochezia yesterday. He denies any fever, night sweats, cough, wheezing, PND, orthopnea, palpitations, abdominal distention, lower extremity swelling or redness, joint pain, skin rash, headache, or seizures.   PAST MEDICAL HISTORY:  has a past medical history of Type 2 diabetes mellitus; Coronary atherosclerosis of native coronary artery; Mixed hyperlipidemia; PVD (peripheral vascular disease); Essential hypertension, benign; Prostate cancer; and Obstructive sleep apnea.     PAST SURGICAL HISTORY: Past Surgical History  Procedure Laterality Date  . Tonsillectomy    . Back surgery    . Vasectomy    . Knee surgery      Right  . Shoulder surgery    . Prostatectomy    . Colonoscopy with esophagogastroduodenoscopy (egd) N/A 08/27/2013    Procedure: COLONOSCOPY WITH ESOPHAGOGASTRODUODENOSCOPY (EGD);  Surgeon: Malissa Hippo, MD;  Location: [facility];  Service: Endoscopy;  Laterality: N/A;  730  . Balloon dilation N/A 08/27/2013    Procedure: BALLOON DILATION;  Surgeon: Malissa Hippo, MD;  Location: [facility];  Service: Endoscopy;  Laterality: N/A;  Elease Hashimoto dilation N/A 08/27/2013    Procedure: Elease Hashimoto DILATION;  Surgeon: Malissa Hippo, MD;  Location: [facility];  Service: Endoscopy;  Laterality: N/A;  . Savory dilation N/A 08/27/2013   Procedure: SAVORY DILATION;  Surgeon: Malissa Hippo, MD;  Location: [facility];  Service: Endoscopy;  Laterality: N/A;  EGD PROCEDURE REPORT  PATIENT: [name] MR#: [id number]  Birthdate: 23-Dec-1934, 78 y.o., male  Endoscopist: Dr. Malissa Hippo, MD  Referred By: Dr. Dwana Melena, MD  Procedure Date: 08/27/2013  Procedure: EGD, ED & Colonoscopy  Indications: Patient is 78 year old Caucasian male who has history of GERD presents with dysphagia primarily to solids. He also has recurrent hematochezia lower abdominal pain. He also gives history of single episode of melena earlier this week.  He has history of ulcerative colitis but currently on no medications.  Down the street is positive for colon carcinoma in his brother who was 62 at the time of diagnosis. Patient's last colonoscopy was in June 2011.  Informed Consent: The risks, benefits, alternatives & imponderables which include, but are not limited to, bleeding, infection, perforation, drug reaction and potential missed lesion have been reviewed. The potential for biopsy, lesion removal, esophageal dilation, etc. have also been discussed. Questions have been answered. All parties agreeable. Please see history & physical in medical record for more information.  Medications:  Demerol 50 mg IV  Versed 8 mg IV  Cetacaine spray topically for oropharyngeal anesthesia  EGD  Description of procedure: The endoscope was introduced through the mouth and advanced to the second portion of the duodenum without difficulty or limitations. The mucosal surfaces were surveyed very carefully during advancement of the scope and upon withdrawal.  Findings:  Esophagus: Mucosa of the esophagus was normal except there was a small trochanter ulcer at distal  esophagus just above GE junction. Baby GE junction with edema and erythema noted on the gastric side. No obvious stricture or ring noted.  GEJ: 40 cm  Stomach: Stomach was empty and distended very well  with insufflation. Folds in the proximal stomach are normal. Examination mucosa and body was normal. Patchy erythema and few erosions noted at antrum. Pyloric channel was patent.  And rest fundus and cardia were examined by retroflexing in the scope and were normal.  Duodenum: Normal bulbar and post bulbar mucosa.  Therapeutic/Diagnostic Maneuvers Performed:  Esophagus was dilated by passing 56 [place] Maloney dilator insertion but no mucosal destruction induced.  COLONOSCOPY  Description of procedure: After a digital rectal exam was performed, that colonoscope was advanced from the anus through the rectum and colon to the area of the cecum, ileocecal valve and appendiceal orifice. The cecum was deeply intubated. These structures were well-seen and photographed for the record. From the level of the cecum and ileocecal valve, the scope was slowly and cautiously withdrawn. The mucosal surfaces were carefully surveyed utilizing scope tip to flexion to facilitate fold flattening as needed. The scope was pulled down into the rectum where a thorough exam including retroflexion was performed.  Findings:  Prep satisfactory.  Fine patchy nodularity noted to mucosa of ascending transverse and descending colon. Random biopsies taken.  Patchy erythema edema and punctate erosions voluming 15 cm segment of mid sigmoid colon. Biopsy taken. Mucosa of distal sigmoid was normal.  Few scattered diverticula at sigmoid colon.  Normal rectal mucosa.  Single anal papilla and small hemorrhoids below the dentate line.  Therapeutic/Diagnostic Maneuvers Performed: See above  Complications: None  Cecal Withdrawal Time: 20 minutes  Impression:  EGD findings;  Single small ulcer at distal esophagus along with erythema and edema to mucosa at GE junction. These changes are secondary to reflux esophagitis.  Erosive antral gastritis.  Esophagus dilated by passing 56 [place] Maloney dilator but no mucosal destruction induced.    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 Biopsy taken.  Segmental colitis involving mid sigmoid colon. Biopsy taken.  Mild sigmoid colon diverticulosis.  External hemorrhoids and single anal papilla.  Recommendations:  Pantoprazole 40 mg by mouth every morning.  I will be contacting patient results of biopsy and further recommendations.  REHMAN,NAJEEB U 08/27/2013 8:40 AM  CC: Dr. Catalina Pizza, MD & Dr. Bonnetta Barry ref. provider     CURRENT MEDICATIONS: has a current medication list which includes the following prescription(s): amlodipine, atorvastatin, cyanocobalamin, insulin glargine, lisinopril, metformin, and pantoprazole.   ALLERGIES: Adhesive; Bee venom; Ciprofloxacin; Codeine; Iodine; Latex; Povidone; and Simvastatin   SOCIAL HISTORY:  reports that he has never smoked. He has never used smokeless tobacco. He reports that he does not drink alcohol or use illicit drugs.   FAMILY HISTORY: family history includes Cancer in his brother and brother; Colon cancer in his brother; Diabetes in his father.    REVIEW OF SYSTEMS:  Other than that discussed above is noncontributory.    PHYSICAL EXAM:  height is 5' 9.25" (1.759 m) and weight is 221 lb 4.8 oz (100.381 kg).    GENERAL:alert, no distress and comfortable SKIN: skin color, texture, turgor are normal, no rashes or significant lesions EYES: normal, Conjunctiva are pink and non-injected, sclera clear OROPHARYNX:no exudate, no erythema and lips, buccal mucosa, and tongue normal  NECK: supple, thyroid normal size, non-tender, without nodularity CHEST: Normal AP diameter with no gynecomastia.  LYMPH:  no palpable lymphadenopathy in the cervical, axillary or inguinal LUNGS: clear to auscultation and percussion with normal breathing effort HEART: regular rate & rhythm and no murmurs ABDOMEN:abdomen soft, non-tender and normal bowel  sounds MUSCULOSKELETALl:no cyanosis of digits, no clubbing or edema  NEURO: alert & oriented x 3 with fluent speech, no focal motor/sensory deficits    LABORATORY DATA:  Infusion on 09/03/2013  Component Date Value Range Status  . WBC 09/03/2013 6.3  4.0 - 10.5 K/uL Final  . RBC 09/03/2013 5.30  4.22 - 5.81 MIL/uL Final  . Hemoglobin 09/03/2013 16.0  13.0 - 17.0 g/dL Final  . HCT 47/82/9562 47.8  39.0 - 52.0 % Final  . MCV 09/03/2013 90.2  78.0 - 100.0 fL Final  . MCH 09/03/2013 30.2  26.0 - 34.0 pg Final  . MCHC 09/03/2013 33.5  30.0 - 36.0 g/dL Final  . RDW 13/04/6577 14.3  11.5 - 15.5 % Final  . Platelets 09/03/2013 137* 150 - 400 K/uL Final  . Neutrophils Relative % 09/03/2013 66  43 - 77 % Final  . Neutro Abs 09/03/2013 4.2  1.7 - 7.7 K/uL Final  . Lymphocytes Relative 09/03/2013 25  12 - 46 % Final  . Lymphs Abs 09/03/2013 1.6  0.7 - 4.0 K/uL Final  . Monocytes Relative 09/03/2013 6  3 - 12 % Final  . Monocytes Absolute 09/03/2013 0.4  0.1 - 1.0 K/uL Final  . Eosinophils Relative 09/03/2013 2  0 - 5 % Final  . Eosinophils Absolute 09/03/2013 0.1  0.0 - 0.7 K/uL Final  . Basophils Relative 09/03/2013 1  0 - 1 % Final  . Basophils Absolute 09/03/2013 0.0  0.0 - 0.1 K/uL Final  . Sodium 09/03/2013 138  135 - 145 mEq/L Final  . Potassium 09/03/2013 3.8  3.5 - 5.1 mEq/L Final  . Chloride 09/03/2013 97  96 - 112 mEq/L Final  . CO2 09/03/2013 30  19 - 32 mEq/L Final  . Glucose, Bld 09/03/2013 165* 70 - 99 mg/dL Final  . BUN 46/96/2952 17  6 - 23 mg/dL Final  . Creatinine, Ser 09/03/2013 1.23  0.50 - 1.35 mg/dL Final  . Calcium 84/13/2440 9.3  8.4 - 10.5 mg/dL Final  . Total Protein 09/03/2013 7.2  6.0 - 8.3 g/dL Final  . Albumin 07/20/2535 3.9  3.5 - 5.2 g/dL Final  . AST 64/40/3474 20  0 - 37 U/L Final  . ALT 09/03/2013 16  0 - 53 U/L Final  . Alkaline Phosphatase 09/03/2013 53  39 - 117 U/L Final  . Total Bilirubin 09/03/2013 1.0  0.3 - 1.2 mg/dL Final  . GFR calc non Af  Amer 09/03/2013 56* >90 mL/min Final  . GFR calc Af Amer 09/03/2013 65* >90 mL/min Final   Comment: (NOTE)                          The eGFR has been calculated using the CKD EPI equation.                          This calculation has not been validated in all clinical situations.                          eGFR's persistently <90 mL/min signify possible Chronic Kidney  Disease.  [name] LDH 09/03/2013 136  94 - 250 U/L Final  . Beta-2 Microglobulin 09/03/2013 2.97* 1.01 - 1.73 mg/L Final   Performed at [facility]  Admission on 08/27/2013, Discharged on 08/27/2013  Component Date Value Range Status  . Glucose-Capillary 08/27/2013 138* 70 - 99 mg/dL Final    Urinalysis    Component Value Date/Time   COLORURINE YELLOW 09/19/2007 1940   APPEARANCEUR CLEAR 09/19/2007 1940   LABSPEC 1.020 09/19/2007 1940   PHURINE 5.5 09/19/2007 1940   GLUCOSEU >1000* 09/19/2007 1940   HGBUR TRACE* 09/19/2007 1940   BILIRUBINUR NEGATIVE 09/19/2007 1940   KETONESUR NEGATIVE 09/19/2007 1940   PROTEINUR NEGATIVE 09/19/2007 1940   UROBILINOGEN 0.2 09/19/2007 1940   NITRITE NEGATIVE 09/19/2007 1940   LEUKOCYTESUR NEGATIVE 09/19/2007 1940      @RADIOGRAPHY : Ct Chest W Contrast  09/06/2013   CLINICAL DATA:  New diagnosis of mantle cell lymphoma. Staging scan.  EXAM: CT CHEST, ABDOMEN, AND PELVIS WITH CONTRAST  TECHNIQUE: Multidetector CT imaging of the chest, abdomen and pelvis was performed following the standard protocol during bolus administration of intravenous contrast.  CONTRAST:  OMNIPAQUE IOHEXOL 300 MG/ML  SOLN  COMPARISON:  CT of the chest 08/25/2007. CT of the abdomen and pelvis 08/16/2008.  FINDINGS: CT CHEST FINDINGS  Mediastinum: Heart size is normal. There is no significant pericardial fluid, thickening or pericardial calcification. No pathologically enlarged mediastinal or hilar lymph nodes. Small hiatal hernia.  Lungs/Pleura: 5 mm ground-glass attenuation  nodule in the lateral segment of the right middle lobe (image 40 of series 3). Other scattered peripheral 1-2 mm pulmonary nodules in the lungs bilaterally are highly nonspecific, but are most compatible with areas of very mild mucoid impaction within terminal bronchioles. No larger more suspicious appearing pulmonary nodules or masses are otherwise noted. No acute consolidative airspace disease. No pleural effusions.  Musculoskeletal: Orthopedic fixation hardware in the lower cervical spine incompletely visualized. There are no aggressive appearing lytic or blastic lesions noted in the visualized portions of the skeleton.  CT ABDOMEN AND PELVIS FINDINGS  Abdomen/Pelvis: The appearance of the liver, gallbladder, pancreas, spleen and bilateral adrenal glands is unremarkable. Multiple low-attenuation renal lesions bilaterally are generally too small to definitively characterize. Of these renal lesions, the ones larger than 1 cm are all compatible with simple cysts, with the largest of these extending off the lower pole of the left kidney measuring 2.6 cm in diameter.  There are numerous nonenlarged retroperitoneal lymph nodes which are conspicuous in number rather than size. No definite pathologically enlarged lymph nodes are identified within the abdomen or pelvis on today's examination. Atherosclerosis throughout the abdominal and pelvic vasculature, without definite aneurysm. Numerous colonic diverticulae are noted, most pronounced in the region of the proximal sigmoid colon and distal descending colon, without surrounding inflammatory changes to suggest an acute diverticulitis at this time. No significant volume of ascites. No pneumoperitoneum. No pathologic distention of small bowel. Status post radical prostatectomy. Penile implant with reservoir in the left side of the pelvis anteriorly.  Musculoskeletal: There are no aggressive appearing lytic or blastic lesions noted in the visualized portions of the  skeleton.  IMPRESSION: 1. No significant lymphadenopathy identified within the chest, abdomen or pelvis. 2. 5 mm ground-glass attenuation sub solid nodule in the right middle lobe. Nodules of this size and appearance do not require specific imaging followup. This recommendation follows the consensus statement: Recommendations for the Management of Subsolid Pulmonary Nodules Detected at CT: A Statement from the Fleischner Society  as published in Radiology 2013; 266:304-317. 3. Multiple low-attenuation renal lesions bilaterally, the majority of which are too small to definitively characterize. These lesions are all favored to represent cysts, but would require MRI with and without IV gadolinium for definitive characterization if of clinical concern. 4. Colonic diverticulosis without findings to suggest acute diverticulitis at this time.   Electronically Signed   By: Trudie Reed M.D.   On: 09/06/2013 17:46   Ct Abdomen Pelvis W Contrast  09/06/2013   CLINICAL DATA:  New diagnosis of mantle cell lymphoma. Staging scan.  EXAM: CT CHEST, ABDOMEN, AND PELVIS WITH CONTRAST  TECHNIQUE: Multidetector CT imaging of the chest, abdomen and pelvis was performed following the standard protocol during bolus administration of intravenous contrast.  CONTRAST:  OMNIPAQUE IOHEXOL 300 MG/ML  SOLN  COMPARISON:  CT of the chest 08/25/2007. CT of the abdomen and pelvis 08/16/2008.  FINDINGS: CT CHEST FINDINGS  Mediastinum: Heart size is normal. There is no significant pericardial fluid, thickening or pericardial calcification. No pathologically enlarged mediastinal or hilar lymph nodes. Small hiatal hernia.  Lungs/Pleura: 5 mm ground-glass attenuation nodule in the lateral segment of the right middle lobe (image 40 of series 3). Other scattered peripheral 1-2 mm pulmonary nodules in the lungs bilaterally are highly nonspecific, but are most compatible with areas of very mild mucoid impaction within terminal bronchioles. No  larger more suspicious appearing pulmonary nodules or masses are otherwise noted. No acute consolidative airspace disease. No pleural effusions.  Musculoskeletal: Orthopedic fixation hardware in the lower cervical spine incompletely visualized. There are no aggressive appearing lytic or blastic lesions noted in the visualized portions of the skeleton.  CT ABDOMEN AND PELVIS FINDINGS  Abdomen/Pelvis: The appearance of the liver, gallbladder, pancreas, spleen and bilateral adrenal glands is unremarkable. Multiple low-attenuation renal lesions bilaterally are generally too small to definitively characterize. Of these renal lesions, the ones larger than 1 cm are all compatible with simple cysts, with the largest of these extending off the lower pole of the left kidney measuring 2.6 cm in diameter.  There are numerous nonenlarged retroperitoneal lymph nodes which are conspicuous in number rather than size. No definite pathologically enlarged lymph nodes are identified within the abdomen or pelvis on today's examination. Atherosclerosis throughout the abdominal and pelvic vasculature, without definite aneurysm. Numerous colonic diverticulae are noted, most pronounced in the region of the proximal sigmoid colon and distal descending colon, without surrounding inflammatory changes to suggest an acute diverticulitis at this time. No significant volume of ascites. No pneumoperitoneum. No pathologic distention of small bowel. Status post radical prostatectomy. Penile implant with reservoir in the left side of the pelvis anteriorly.  Musculoskeletal: There are no aggressive appearing lytic or blastic lesions noted in the visualized portions of the skeleton.  IMPRESSION: 1. No significant lymphadenopathy identified within the chest, abdomen or pelvis. 2. 5 mm ground-glass attenuation sub solid nodule in the right middle lobe. Nodules of this size and appearance do not require specific imaging followup. This recommendation  follows the consensus statement: Recommendations for the Management of Subsolid Pulmonary Nodules Detected at CT: A Statement from the Fleischner Society as published in Radiology 2013; 266:304-317. 3. Multiple low-attenuation renal lesions bilaterally, the majority of which are too small to definitively characterize. These lesions are all favored to represent cysts, but would require MRI with and without IV gadolinium for definitive characterization if of clinical concern. 4. Colonic diverticulosis without findings to suggest acute diverticulitis at this time.   Electronically Signed  By: Trudie Reed M.D.   On: 09/06/2013 17:46    PATHOLOGY: FINAL for ROGERS, DITTER ([id number]) Patient: EIVIN, MASCIO Collected: 08/27/2013 Client: [facility] Accession: [id number] Received: 08/27/2013 Lionel December DOB: 10-22-1934 Age: 43 Gender: M Reported: 09/01/2013 [address] Patient Ph: [phone] MRN #: [id number] [place] [address] Visit #: [id number] Chart #: Phone: [phone] Fax: CC: REPORT OF SURGICAL PATHOLOGY FINAL DIAGNOSIS Diagnosis 1. Colon, biopsy, random - MANTLE CELL LYMPHOMA, SEE COMMENT. 2. Colon, biopsy, sigmoid - POLYPOID FRAGMENTS OF COLONIC MUCOSA WITH LYMPHOID AGGREGATES. Microscopic Comment 1. The biopsy shows polypoid colonic mucosa with atypical lymphoproliferation arranged in a nodular pattern. Immunohistochemical stains were performed and atypical small lymphocytes are strongly positive for CD20, CD79a, CD5, Cyclin D-1 and BCL-2. Ki-67 demonstrates no significant increased staining. CD43 is equivocal. The atypical lymphocytes are negative for CD10 and CD3. The overall findings are mostly consistent with Mantle cell lymphoma. The case was discussed with Dr. Lionel December on 08/31/2013. Dr. Laureen Ochs agrees. 2. There is no evidence of atypical lymphoproliferation in this material. No dysplasia or malignancy is identified. Abigail Miyamoto  MD Pathologist, Electronic Signature (Case signed 09/01/2013) Specimen Gross and Clinical Information Specimen(s) Obtained: 1. Colon, biopsy, random 2. Colon, biopsy, sigmoid Specimen Clinical Information 1. pan colonic nodules 2. focal colitis; Pre-op: heme positive stool; epigastric pain, dysphagia; Post-op: gastric erosions, gastritis, GE junction @ 40 cm; distal esophageal triangular ulcer; colon: sigmoid colitis, sigmoid diverticulosis; pan colonic nodules which were bx'd; sigmoid erythema, edema, friability; anal papilla, hemorrhoids 1 of 2 FINAL for Shaker, Lucky C ([id number]) Gross 1. Received in formalin are tan, soft tissue fragments that are submitted in toto. Number: five pieces, Size: 0.3 to 1 cm (Aggregate measurement: 1.2 x 1 x 0.2 cm). 2. Received in formalin are tan, soft tissue fragments that are submitted in toto. Number: four pieces, Size: 0.2 to 0.3 (Aggregate measurement: 0.6 x 0.5 x 0.2 cm). (GRP:ecj 08/27/2013) Stain(s) used in Diagnosis: The following stain(s) were used in diagnosing the case: CD 79a, CD 5, BCl 2, CD 3, CD 43, KI-67-NO ACIS, CD-10, CD 20, CYCLIN D1. The control(s) stained appropriately. Disclaimer Some of these immunohistochemical stains may have been developed and the performance characteristics determined by [facility]. Some may not have been cleared or approved by the U.S. Food and Drug Administration. The FDA has determined that such clearance or approval is not necessary. This test is used for clinical purposes. It should not be regarded as investigational or for research. This laboratory is certified under the Clinical Laboratory Improvement Amendments of 1988 (CLIA-88) as qualified to perform high complexity clinical laboratory testing. Report signed out from the following location(s) Technical Component performed at [facility] [address],[place], [place] [address] CLIA: [id number]., Technical Component  performed at [facility] [address], [place], [place] [address]. CLIA #: [id number], Interpretation performed at [facility] [address], [place], [place] [address]. CLIA #: [id number],  IMPRESSION:  #1. Stage I mantle cell lymphoma involving the colon. #2. History of prostate cancer, status post radical prostatectomy followed by postop radiotherapy given by external beam technique to 2 incomplete resection, last PSA 0.05. #3. History of radiation induced proctitis, stable. #4. History of segmental ulcerative colitis, possibly symptomatic now. #5. History of obstructive sleep apnea syndrome, status post CPAP use, currently no longer using. #6. Gastroesophageal reflux disease, on treatment. #7. Coronary artery disease, no evidence of heart failure or dysrhythmia. #8. Hypertension, controlled.   PLAN:  #  1. In the presence of his wife, he was told about the nature of mantle cell lymphoma and how it is managed. He was told that CT scans performed prior to this visit were negative for lymphadenopathy. #2. Combination chemotherapy with bendamustine, Rituxan, with Neulasta support following insertion of life port which is scheduled for 09/13/2013. #3. Appointment with nurse navigator for details regarding the potential benefits and side effects of chemotherapy. Information about the agents were given to the patient today. #4. Anticipate beginning treatment on 09/20/2013 with office visit that day as well for the day after. #5. Both the patient and his wife expressed understanding of this strategy.  I appreciate the opportunity of sharing in his care.   Maurilio Lovely, MD 09/07/2013 10:24 AM

## 2013-09-07 NOTE — Progress Notes (Signed)
[name] presented for [employer]. Labs per MD order drawn via Peripheral Line 23 gauge needle inserted in left AC  Good blood return present. Procedure without incident.  Needle removed intact. Patient tolerated procedure well.

## 2013-09-07 NOTE — Patient Instructions (Addendum)
[facility] Discharge Instructions  RECOMMENDATIONS MADE BY THE CONSULTANT AND ANY TEST RESULTS WILL BE SENT TO YOUR REFERRING PHYSICIAN.  EXAM FINDINGS BY THE PHYSICIAN TODAY AND SIGNS OR SYMPTOMS TO REPORT TO CLINIC OR PRIMARY PHYSICIAN: Exam and findings as discussed by Dr. Zigmund Daniel.  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  Would also give you Neulasta 24 hours after treatment.  MEDICATIONS PRESCRIBED:  Valium 5 mg - take 1 at bedtime as needed for sleep. Oxycodone - 5 mg  Take 1 every 4 hours as needed for severe pain.  INSTRUCTIONS/FOLLOW-UP: Follow-up with 1st treatment.  Thank you for choosing [facility] to provide your oncology and hematology care.  To afford each patient quality time with our providers, please arrive at least 15 minutes before your scheduled appointment time.  With your help, our goal is to use those 15 minutes to complete the necessary work-up to ensure our physicians have the information they need to help with your evaluation and healthcare recommendations.    Effective January 1st, 2014, we ask that you re-schedule your appointment with our physicians should you arrive 10 or more minutes late for your appointment.  We strive to give you quality time with our providers, and arriving late affects you and other patients whose appointments are after yours.    Again, thank you for choosing [facility].  Our hope is that these requests will decrease the amount of time that you wait before being seen by our physicians.       _____________________________________________________________  Should you have questions after your visit to [facility], please contact our office at [phone] between the hours of 8:30 a.m. and 5:00 p.m.  Voicemails left after 4:30 p.m. will not be returned until the following business day.  For  prescription refill requests, have your pharmacy contact our office with your prescription refill request.

## 2013-09-08 ENCOUNTER — Telehealth (HOSPITAL_COMMUNITY): Payer: Self-pay | Admitting: Hematology and Oncology

## 2013-09-08 MED ORDER — ACYCLOVIR 400 MG PO TABS: 400.0000 mg | ORAL_TABLET | Freq: Every day | ORAL | Status: DC

## 2013-09-08 MED ORDER — METOCLOPRAMIDE HCL 5 MG PO TABS: 5.0000 mg | ORAL_TABLET | Freq: Four times a day (QID) | ORAL | Status: DC | PRN

## 2013-09-08 MED ORDER — ALLOPURINOL 300 MG PO TABS: 300.0000 mg | ORAL_TABLET | Freq: Every day | ORAL | Status: DC

## 2013-09-08 MED ORDER — PROCHLORPERAZINE MALEATE 10 MG PO TABS: 10.0000 mg | ORAL_TABLET | Freq: Four times a day (QID) | ORAL | Status: DC | PRN

## 2013-09-08 MED ORDER — LIDOCAINE-PRILOCAINE 2.5-2.5 % EX CREA: TOPICAL_CREAM | CUTANEOUS | Status: DC

## 2013-09-08 NOTE — Patient Instructions (Addendum)
[name]   CHEMOTHERAPY INSTRUCTIONS  Rituxan - 1 hour before your chemo appt time you will need to take Tylenol 650mg  and Benadryl 50mg . You can take this at home. This reduces your risk of having an allergic reaction to the Rituxan. You will do this each time prior to Rituxan. Side Effects: during infusion - itching, low blood pressure, low oxygen, bronchospasm, rash, trouble breathing - we need to know immediately if any of this happens. The first time you receive this drug, it takes a long time to infuse because we titrate the drug very slowly. With each Rituxan infusion, the likelihood of developing an infusion reaction decreases. You may also experience fever, chills, shaking chills, headaches, muscle aches, nausea, rash, and a low white blood cell count. We need to be sure that you are drinking plenty of fluids - preferably 64oz of decaff fluids/water daily. It is best to start drinking fluids 2 days prior to treatment and for up to 4-5 days after treatment. As your tumor breaks down, it leaves behind uric acid and the extra fluid that you drink helps to flush this out of your body. You will also be on a medication called Allopurinol while taking Rituxan which help rid your body of the uric acid. It is important that you take this medication daily as prescribed.    Bendamustine - low white blood cell count, low platelet count, fever, nausea, vomiting. This infuses over 30-60 minutes.    Neulasta - this medication is not chemo but being given because you have had chemo. It is usually given 20-24 hours after the completion of chemotherapy. This medication works by boosting your bone marrow's supply of white blood cells. White blood cells are what protect our bodies against infection. The medication is given in the form of a subcutaneous injection. It is given in the fatty tissue of your abdomen. It is a short needle. The major side effect of this medication is  bone or muscle pain. The drug of choice to relieve or lessen the pain is Aleve or Ibuprofen. If a physician has ever told you not to take Aleve or Ibuprofen - then don't take it. You should then take Tylenol/acetaminophen. Take either medication as the bottle directs you to.  The level of pain you experience as a result of this injection can range from none, to mild or moderate, or severe. Please let us know if you develop moderate or severe bone pain.    The two medications you will receive in your IV before your chemo are Zofran and Dexamethasone. Zofran is to reduce/prevent nausea/vomiting. Dexamethasone is to decrease the risk of having an allergic reaction to the Rituxan as well as to decrease nausea/vomiting. Some side effects of the Dexamethasone include: feeling anxious, make you irritable, have an increase in energy, trouble sleeping, turning red in the face, neck, & shoulders (this may last a couple of days).     POTENTIAL SIDE EFFECTS OF TREATMENT: Increased Susceptibility to Infection, Vomiting, Constipation, Hair Thinning, Changes in Character of Skin and Nails (brittleness, dryness,etc.), Bone Marrow Suppression, Complete Hair Loss, Nausea, Diarrhea, Sun Sensitivity and Mouth Sores    EDUCATIONAL MATERIALS GIVEN AND REVIEWED: Chemotherapy and You  Specific Instructions Sheets: Rituxan, Bendamustine, Neulasta, Allopurinol, Acyclovir, Reglan/Metoclopramide, Compazine/Prochlorperazine, Tylenol, Benadryl, EMLA cream   SELF CARE ACTIVITIES WHILE ON CHEMOTHERAPY: Increase your fluid intake 48 hours prior to treatment and drink at least 2 quarts per day after treatment., No alcohol intake., No  aspirin or other medications unless approved by your oncologist., Eat foods that are light and easy to digest., Eat foods at cold or room temperature., No fried, fatty, or spicy foods immediately before or after treatment., Have teeth cleaned professionally before starting treatment. Keep dentures and  partial plates clean., Use soft toothbrush and do not use mouthwashes that contain alcohol. Biotene is a good mouthwash. Use warm salt water gargles (1 teaspoon salt per 1 quart warm water) before and after meals and at bedtime. Or you may rinse with 2 tablespoons of three -percent hydrogen peroxide mixed in eight ounces of water., Always use sunscreen with SPF (Sun Protection Factor) of 30 or higher., Use your nausea medication as directed to prevent nausea., Use your stool softener or laxative as directed to prevent constipation. and Use your anti-diarrheal medication as directed to stop diarrhea.  Please wash your hands for at least 30 seconds using warm soapy water. Handwashing is the #1 way to prevent the spread of germs. Stay away from sick people or people who are getting over a cold. If you develop respiratory systems such as green/yellow mucus production or productive cough or persistent cough let us know and we will see if you need an antibiotic. It is a good idea to keep a pair of gloves on when going into grocery stores/[employer] to decrease your risk of coming into contact with germs on the carts, etc. Carry alcohol hand gel with you at all times and use it frequently if out in public. All foods need to be cooked thoroughly. No raw foods. No medium or undercooked meats, eggs. If your food is cooked medium well, it does not need to be hot pink or saturated with bloody liquid at all. Vegetables and fruits need to be washed/rinsed under the faucet with a dish detergent before being consumed. You can eat raw fruits and vegetables unless we tell you otherwise but it would be best if you cooked them or bought frozen. Do not eat off of salad bars or hot bars unless you really trust the cleanliness of the restaurant. If you need dental work, please let us know before you go for your appointment so that we can coordinate the best possible time for you in regards to your chemo regimen. You need to also let your  dentist know that you are actively taking chemo. We may need to do labs prior to your dental appointment. We also want your bowels moving at least every other day. If this is not happening, we need to know so that we can get you on a bowel regimen to help you go.      MEDICATIONS: You have been given prescriptions for the following medications:  Allopurinol 300mg  tablet. Take 1 tablet daily. This medication helps to rid your body of the uric acid. It is important that you take this medication daily as prescribed.   Acyclovir 400mg  tablet. Take 1 tablet daily. This medication is an anti-viral. You are taking this because your immune system will be weakened while on chemo. This is to give you a little extra protection against things like shingles, etc.  Reglan/metoclopramide 5mg  tablet. May take 1 tablet four times a day if needed for nausea/vomiting.  Compazine 10mg  tablet. May take 1 tablet four times a day if needed for nausea/vomiting.   EMLA cream. Apply a quarter size amount to port site 1 hour prior to chemo. Do not rub in. Cover with plastic wrap.   Tylenol 325mg  tablet. Take  2 tablets 1 hour prior to your Rituxan appointment time.   Benadryl 25mg  tablet. Take 2 tablets 1 hour prior to your Rituxan appointment time.   Over-the-Counter Meds:   Colace - this is a stool softener. Take 100mg  capsule 2-6 times a day as needed. If you have to take more than 6 capsules of Colace a day call the [name].   Senna - this is a mild laxative used to treat mild constipation. May take up to 3 tabs nightly @ bedtime if needed for constipation.   Milk of Magnesia - this is a laxative used to treat moderate to severe constipation. May take 2-4 tablespoons every 8 hours as needed. May increase to 8 tablespoons x 1 dose and if no bowel movement call the [name].  Imodium - this is for diarrhea. Take 2 tabs after 1st loose stool and then 1 tab after each loose stool until you go a total of  12 hours without a loose stool. Call [name] if loose stools continue.   SYMPTOMS TO REPORT AS SOON AS POSSIBLE AFTER TREATMENT:  FEVER GREATER THAN 100.5 F  CHILLS WITH OR WITHOUT FEVER  NAUSEA AND VOMITING THAT IS NOT CONTROLLED WITH YOUR NAUSEA MEDICATION  UNUSUAL SHORTNESS OF BREATH  UNUSUAL BRUISING OR BLEEDING  TENDERNESS IN MOUTH AND THROAT WITH OR WITHOUT PRESENCE OF ULCERS  URINARY PROBLEMS  BOWEL PROBLEMS  UNUSUAL RASH    Wear comfortable clothing and clothing appropriate for easy access to any Portacath or PICC line. Let us know if there is anything that we can do to make your therapy better!      I have been informed and understand all of the instructions given to me and have received a copy. I have been instructed to call the clinic [phone] or my family physician as soon as possible for continued medical care, if indicated. I do not have any more questions at this time but understand that I may call the [name] or the Patient Navigator at [phone] during office hours should I have questions or need assistance in obtaining follow-up care.      _________________________________________      _______________     __________ Signature of Patient or Authorized Representative        Date                            Time      _________________________________________ Nurse's Signature      Rituximab injection What is this medicine? RITUXIMAB (ri TUX i mab) is a monoclonal antibody. This medicine changes the way the body's immune system works. It is used commonly to treat non-Hodgkin's lymphoma and other conditions. In cancer cells, this drug targets a specific protein within cancer cells and stops the cancer cells from growing. It is also used to treat rhuematoid arthritis (RA). In RA, this medicine slow the inflammatory process and help reduce joint pain and swelling. This medicine is often used with other cancer or arthritis  medications. This medicine may be used for other purposes; ask your health care provider or pharmacist if you have questions. COMMON BRAND NAME(S): Rituxan What should I tell my health care provider before I take this medicine? They need to know if you have any of these conditions: -blood disorders -heart disease -history of hepatitis B -infection (especially a virus infection such as chickenpox, cold sores, or herpes) -irregular heartbeat -kidney disease -lung or  breathing disease, like asthma -lupus -an unusual or allergic reaction to rituximab, mouse proteins, other medicines, foods, dyes, or preservatives -pregnant or trying to get pregnant -breast-feeding How should I use this medicine? This medicine is for infusion into a vein. It is administered in a hospital or clinic by a specially trained health care professional. A special MedGuide will be given to you by the pharmacist with each prescription and refill. Be sure to read this information carefully each time. Talk to your pediatrician regarding the use of this medicine in children. This medicine is not approved for use in children. Overdosage: If you think you have taken too much of this medicine contact a poison control [name] or emergency room at once. NOTE: This medicine is only for you. Do not share this medicine with others. What if I miss a dose? It is important not to miss a dose. Call your doctor or health care professional if you are unable to keep an appointment. What may interact with this medicine? -cisplatin -medicines for blood pressure -some other medicines for arthritis -vaccines This list may not describe all possible interactions. Give your health care provider a list of all the medicines, herbs, non-prescription drugs, or dietary supplements you use. Also tell them if you smoke, drink alcohol, or use illegal drugs. Some items may interact with your medicine. What should I watch for while using this  medicine? Report any side effects that you notice during your treatment right away, such as changes in your breathing, fever, chills, dizziness or lightheadedness. These effects are more common with the first dose. Visit your prescriber or health care professional for checks on your progress. You will need to have regular blood work. Report any other side effects. The side effects of this medicine can continue after you finish your treatment. Continue your course of treatment even though you feel ill unless your doctor tells you to stop. Call your doctor or health care professional for advice if you get a fever, chills or sore throat, or other symptoms of a cold or flu. Do not treat yourself. This drug decreases your body's ability to fight infections. Try to avoid being around people who are sick. This medicine may increase your risk to bruise or bleed. Call your doctor or health care professional if you notice any unusual bleeding. Be careful brushing and flossing your teeth or using a toothpick because you may get an infection or bleed more easily. If you have any dental work done, tell your dentist you are receiving this medicine. Avoid taking products that contain aspirin, acetaminophen, ibuprofen, naproxen, or ketoprofen unless instructed by your doctor. These medicines may hide a fever. Do not become pregnant while taking this medicine. Women should inform their doctor if they wish to become pregnant or think they might be pregnant. There is a potential for serious side effects to an unborn child. Talk to your health care professional or pharmacist for more information. Do not breast-feed an infant while taking this medicine. What side effects may I notice from receiving this medicine? Side effects that you should report to your doctor or health care professional as soon as possible: -allergic reactions like skin rash, itching or hives, swelling of the face, lips, or tongue -low blood counts - this  medicine may decrease the number of white blood cells, red blood cells and platelets. You may be at increased risk for infections and bleeding. -signs of infection - fever or chills, cough, sore throat, pain or difficulty passing urine -  signs of decreased platelets or bleeding - bruising, pinpoint red spots on the skin, black, tarry stools, blood in the urine -signs of decreased red blood cells - unusually weak or tired, fainting spells, lightheadedness -breathing problems -confused, not responsive -chest pain -fast, irregular heartbeat -feeling faint or lightheaded, falls -mouth sores -redness, blistering, peeling or loosening of the skin, including inside the mouth -stomach pain -swelling of the ankles, feet, or hands -trouble passing urine or change in the amount of urine Side effects that usually do not require medical attention (report to your doctor or other health care professional if they continue or are bothersome): -anxiety -headache -loss of appetite -muscle aches -nausea -night sweats This list may not describe all possible side effects. Call your doctor for medical advice about side effects. You may report side effects to FDA at 1-800-FDA-1088. Where should I keep my medicine? This drug is given in a hospital or clinic and will not be stored at home. NOTE: This sheet is a summary. It may not cover all possible information. If you have questions about this medicine, talk to your doctor, pharmacist, or health care provider.  2014, Elsevier/Gold Standard. (2008-05-09 14:04:59) Bendamustine Injection What is this medicine? BENDAMUSTINE (BEN da MUS teen) is a chemotherapy drug. It is used to treat chronic lymphocytic leukemia and non-Hodgkin lymphoma. This medicine may be used for other purposes; ask your health care provider or pharmacist if you have questions. COMMON BRAND NAME(S): Treanda What should I tell my health care provider before I take this medicine? They need to  know if you have any of these conditions: -kidney disease -liver disease -an unusual or allergic reaction to bendamustine, mannitol, other medicines, foods, dyes, or preservatives -pregnant or trying to get pregnant -breast-feeding How should I use this medicine? This medicine is for infusion into a vein. It is given by a health care professional in a hospital or clinic setting. Talk to your pediatrician regarding the use of this medicine in children. Special care may be needed. Overdosage: If you think you have taken too much of this medicine contact a poison control [name] or emergency room at once. NOTE: This medicine is only for you. Do not share this medicine with others. What if I miss a dose? It is important not to miss your dose. Call your doctor or health care professional if you are unable to keep an appointment. What may interact with this medicine? Do not take this medicine with any of the following medications: -clozapine This medicine may also interact with the following medications: -atazanavir -cimetidine -ciprofloxacin -enoxacin -fluvoxamine -medicines for seizures like carbamazepine and phenobarbital -mexiletine -rifampin -tacrine -thiabendazole -zileuton This list may not describe all possible interactions. Give your health care provider a list of all the medicines, herbs, non-prescription drugs, or dietary supplements you use. Also tell them if you smoke, drink alcohol, or use illegal drugs. Some items may interact with your medicine. What should I watch for while using this medicine? Your condition will be monitored carefully while you are receiving this medicine. This drug may make you feel generally unwell. This is not uncommon, as chemotherapy can affect healthy cells as well as cancer cells. Report any side effects. Continue your course of treatment even though you feel ill unless your doctor tells you to stop. Call your doctor or health care professional for  advice if you get a fever, chills or sore throat, or other symptoms of a cold or flu. Do not treat yourself. This drug decreases your  body's ability to fight infections. Try to avoid being around people who are sick. This medicine may increase your risk to bruise or bleed. Call your doctor or health care professional if you notice any unusual bleeding. Be careful brushing and flossing your teeth or using a toothpick because you may get an infection or bleed more easily. If you have any dental work done, tell your dentist you are receiving this medicine. Avoid taking products that contain aspirin, acetaminophen, ibuprofen, naproxen, or ketoprofen unless instructed by your doctor. These medicines may hide a fever. Do not become pregnant while taking this medicine. Women should inform their doctor if they wish to become pregnant or think they might be pregnant. There is a potential for serious side effects to an unborn child. Men should inform their doctors if they wish to father a child. This medicine may lower sperm counts. Talk to your health care professional or pharmacist for more information. Do not breast-feed an infant while taking this medicine. What side effects may I notice from receiving this medicine? Side effects that you should report to your doctor or health care professional as soon as possible: -allergic reactions like skin rash, itching or hives, swelling of the face, lips, or tongue -low blood counts - this medicine may decrease the number of white blood cells, red blood cells and platelets. You may be at increased risk for infections and bleeding. -signs of infection - fever or chills, cough, sore throat, pain or difficulty passing urine -signs of decreased platelets or bleeding - bruising, pinpoint red spots on the skin, black, tarry stools, blood in the urine -signs of decreased red blood cells - unusually weak or tired, fainting spells, lightheadedness -trouble passing urine or  change in the amount of urine Side effects that usually do not require medical attention (report to your doctor or health care professional if they continue or are bothersome): -diarrhea This list may not describe all possible side effects. Call your doctor for medical advice about side effects. You may report side effects to FDA at 1-800-FDA-1088. Where should I keep my medicine? This drug is given in a hospital or clinic and will not be stored at home. NOTE: This sheet is a summary. It may not cover all possible information. If you have questions about this medicine, talk to your doctor, pharmacist, or health care provider.  2014, Elsevier/Gold Standard. (2011-12-06 14:15:47) Allopurinol tablets What is this medicine? ALLOPURINOL (al oh PURE i nole) reduces the amount of uric acid the body makes. It is used to treat the symptoms of gout. It is also used to treat or prevent high uric acid levels that occur as a result of certain types of chemotherapy. This medicine may also help patients who frequently have kidney stones. This medicine may be used for other purposes; ask your health care provider or pharmacist if you have questions. COMMON BRAND NAME(S): Zyloprim What should I tell my health care provider before I take this medicine? They need to know if you have any of these conditions: -kidney or liver disease -an unusual or allergic reaction to allopurinol, other medicines, foods, dyes, or preservatives -pregnant or trying to get pregnant -breast feeding How should I use this medicine? Take this medicine by mouth with a glass of water. Follow the directions on the prescription label. If this medicine upsets your stomach, take it with food or milk. Take your doses at regular intervals. Do not take your medicine more often than directed. Talk to your pediatrician regarding  the use of this medicine in children. Special care may be needed. While this drug may be prescribed for children as young  as 6 years for selected conditions, precautions do apply. Overdosage: If you think you have taken too much of this medicine contact a poison control [name] or emergency room at once. NOTE: This medicine is only for you. Do not share this medicine with others. What if I miss a dose? If you miss a dose, take it as soon as you can. If it is almost time for your next dose, take only that dose. Do not take double or extra doses. What may interact with this medicine? Do not take this medicine with the following medication: -didanosine, ddI This medicine may also interact with the following medications: -amoxicillin or ampicillin -azathioprine -certain medicines used to treat gout -certain types of diuretics -chlorpropamide -cyclosporine -dicumarol -mercaptopurine -tolbutamide -warfarin This list may not describe all possible interactions. Give your health care provider a list of all the medicines, herbs, non-prescription drugs, or dietary supplements you use. Also tell them if you smoke, drink alcohol, or use illegal drugs. Some items may interact with your medicine. What should I watch for while using this medicine? Visit your doctor or health care professional for regular checks on your progress. If you are taking this medicine to treat gout, you may not have less frequent attacks at first. Keep taking your medicine regularly and the attacks should get better within 2 to 6 weeks. Drink plenty of water (10 to 12 full glasses a day) while you are taking this medicine. This will help to reduce stomach upset and reduce the risk of getting gout or kidney stones. Call your doctor or health care professional at once if you get a skin rash together with chills, fever, sore throat, or nausea and vomiting, if you have blood in your urine, or difficulty passing urine. Do not take vitamin C without asking your doctor or health care professional. Too much vitamin C can increase the chance of getting kidney  stones. You may get drowsy or dizzy. Do not drive, use machinery, or do anything that needs mental alertness until you know how this drug affects you. Do not stand or sit up quickly, especially if you are an older patient. This reduces the risk of dizzy or fainting spells. Alcohol can make you more drowsy and dizzy. Alcohol can also increase the chance of stomach problems and increase the amount of uric acid in your blood. Avoid alcoholic drinks. What side effects may I notice from receiving this medicine? Side effects that you should report to your doctor or health care professional as soon as possible: -allergic reactions like skin rash, itching or hives, swelling of the face, lips, or tongue -breathing problems -muscle aches or pains -redness, blistering, peeling or loosening of the skin, including inside the mouth Side effects that usually do not require medical attention (report to your doctor or health care professional if they continue or are bothersome): -changes in taste -diarrhea -indigestion -stomach pain or cramps This list may not describe all possible side effects. Call your doctor for medical advice about side effects. You may report side effects to FDA at 1-800-FDA-1088. Where should I keep my medicine? Keep out of the reach of children. Store at room temperature between 15 and 25 degrees C (59 and 77 degrees F). Protect from light and moisture. Throw away any unused medicine after the expiration date. NOTE: This sheet is a summary. It may not cover  all possible information. If you have questions about this medicine, talk to your doctor, pharmacist, or health care provider.  2014, Elsevier/Gold Standard. (2008-03-14 14:26:54) Acyclovir tablets or capsules What is this medicine? ACYCLOVIR (ay SYE kloe veer) is an antiviral medicine. It is used to treat or prevent infections caused by certain kinds of viruses. Examples of these infections include herpes and shingles. This medicine  will not cure herpes. This medicine may be used for other purposes; ask your health care provider or pharmacist if you have questions. COMMON BRAND NAME(S): Zovirax What should I tell my health care provider before I take this medicine? They need to know if you have any of these conditions: -kidney disease -an unusual or allergic reaction to acyclovir, ganciclovir, valacyclovir, other medicines, foods, dyes, or preservatives -pregnant or trying to get pregnant -breast-feeding How should I use this medicine? Take this medicine by mouth with a glass of water. Follow the directions on the prescription label. You can take it with or without food. Take your medicine at regular intervals. Do not take your medicine more often than directed. Take all of your medicine as directed even if you think your are better. Do not skip doses or stop your medicine early. Talk to your pediatrician regarding the use of this medicine in children. While this drug may be prescribed for selected conditions, precautions do apply. Overdosage: If you think you have taken too much of this medicine contact a poison control [name] or emergency room at once. NOTE: This medicine is only for you. Do not share this medicine with others. What if I miss a dose? If you miss a dose, take it as soon as you can. If it is almost time for your next dose, take only that dose. Do not take double or extra doses. What may interact with this medicine? -probenecid This list may not describe all possible interactions. Give your health care provider a list of all the medicines, herbs, non-prescription drugs, or dietary supplements you use. Also tell them if you smoke, drink alcohol, or use illegal drugs. Some items may interact with your medicine. What should I watch for while using this medicine? Tell your doctor or health care professional if your symptoms do not improve. This medicine works best when started very early in the course of an  infection. Begin treatment at the first signs of infection. Drink 6 to 8 glasses of water or fluids every day while you are taking this medicine. This will help prevent side effects. You can still pass chickenpox, shingles, or herpes to another person even while you are taking this medicine. Avoid contact with others as directed. Genital herpes is a sexually transmitted disease. Talk to your doctor about how to stop the spread of infection. What side effects may I notice from receiving this medicine? Side effects that you should report to your doctor or health care professional as soon as possible: -allergic reactions like skin rash, itching or hives, swelling of the face, lips, or tongue -chest pain -confusion, hallucinations, tremor -dark urine -increased sensitivity to the sun -redness, blistering, peeling or loosening of the skin, including inside the mouth -seizures -trouble passing urine or change in the amount of urine -unusual bleeding or bruising, or pinpoint red spots on the skin -unusually weak or tired -yellowing of the eyes or skin Side effects that usually do not require medical attention (report to your doctor or health care professional if they continue or are bothersome): -diarrhea -fever -headache -nausea, vomiting -stomach  upset This list may not describe all possible side effects. Call your doctor for medical advice about side effects. You may report side effects to FDA at 1-800-FDA-1088. Where should I keep my medicine? Keep out of the reach of children. Store at room temperature between 15 and 25 degrees C (59 and 77 degrees F). Throw away any unused medicine after the expiration date. NOTE: This sheet is a summary. It may not cover all possible information. If you have questions about this medicine, talk to your doctor, pharmacist, or health care provider.  2014, Else[employer]/Gold Standard. (2007-11-25 13:15:46) Lidocaine; Prilocaine cream What is this  medicine? LIDOCAINE; PRILOCAINE (LYE doe kane; PRIL oh kane) is a topical anesthetic that causes loss of feeling in the skin and surrounding tissues. It is used to numb the skin before procedures or injections. This medicine may be used for other purposes; ask your health care provider or pharmacist if you have questions. COMMON BRAND NAME(S): EMLA What should I tell my health care provider before I take this medicine? They need to know if you have any of these conditions: -glucose-6-phosphate deficiencies -heart disease -kidney or liver disease -methemoglobinemia -an unusual or allergic reaction to lidocaine, prilocaine, other medicines, foods, dyes, or preservatives -pregnant or trying to get pregnant -breast-feeding How should I use this medicine? This medicine is for external use only on the skin. Do not take by mouth. Follow the directions on the prescription label. Wash hands before and after use. Do not use more or leave in contact with the skin longer than directed. Do not apply to eyes or open wounds. It can cause irritation and blurred or temporary loss of vision. If this medicine comes in contact with your eyes, immediately rinse the eye with water. Do not touch or rub the eye. Contact your health care provider right away. Talk to your pediatrician regarding the use of this medicine in children. While this medicine may be prescribed for children for selected conditions, precautions do apply. Overdosage: If you think you have taken too much of this medicine contact a poison control [name] or emergency room at once. NOTE: This medicine is only for you. Do not share this medicine with others. What if I miss a dose? This medicine is usually only applied once prior to each procedure. It must be in contact with the skin for a period of time for it to work. If you applied this medicine later than directed, tell your health care professional before starting the procedure. What may interact  with this medicine? -acetaminophen -chloroquine -dapsone -medicines to control heart rhythm -nitrates like nitroglycerin and nitroprusside -other ointments, creams, or sprays that may contain anesthetic medicine -phenobarbital -phenytoin -quinine -sulfonamides like sulfacetamide, sulfamethoxazole, sulfasalazine and others This list may not describe all possible interactions. Give your health care provider a list of all the medicines, herbs, non-prescription drugs, or dietary supplements you use. Also tell them if you smoke, drink alcohol, or use illegal drugs. Some items may interact with your medicine. What should I watch for while using this medicine? Be careful to avoid injury to the treated area while it is numb and you are not aware of pain. Avoid scratching, rubbing, or exposing the treated area to hot or cold temperatures until complete sensation has returned. The numb feeling will wear off a few hours after applying the cream. What side effects may I notice from receiving this medicine? Side effects that you should report to your doctor or health care professional as soon as  possible: -blurred vision -chest pain -difficulty breathing -dizziness -drowsiness -fast or irregular heartbeat -skin rash or itching -swelling of your throat, lips, or face -trembling Side effects that usually do not require medical attention (report to your doctor or health care professional if they continue or are bothersome): -changes in ability to feel hot or cold -redness and swelling at the application site This list may not describe all possible side effects. Call your doctor for medical advice about side effects. You may report side effects to FDA at 1-800-FDA-1088. Where should I keep my medicine? Keep out of reach of children. Store at room temperature between 15 and 30 degrees C (59 and 86 degrees F). Keep container tightly closed. Throw away any unused medicine after the expiration date. NOTE:  This sheet is a summary. It may not cover all possible information. If you have questions about this medicine, talk to your doctor, pharmacist, or health care provider.  2014, [employer]/Gold Standard. (2008-03-14 17:14:35) Acetaminophen tablets or caplets What is this medicine? ACETAMINOPHEN (a set a MEE noe fen) is a pain reliever. It is used to treat mild pain and fever. This medicine may be used for other purposes; ask your health care provider or pharmacist if you have questions. COMMON BRAND NAME(S): Aceta, Actamin, Anacin Aspirin Free, Genapap, Genebs, Mapap, Pain & Fever , Pain and Fever , PAIN RELIEF , PAIN RELIEF Extra Strength, Pain Reliever, Panadol, Q-Pap Extra Strength, Q-Pap, Tylenol CrushableTablet, Tylenol Extra Strength, Tylenol, XS No Aspirin, XS Pain Reliever What should I tell my health care provider before I take this medicine? They need to know if you have any of these conditions: -if you frequently drink alcohol containing drinks -liver disease -an unusual or allergic reaction to acetaminophen, other medicines, foods, dyes or preservatives -pregnant or trying to get pregnant -breast-feeding How should I use this medicine? Take this medicine by mouth with a glass of water. Follow the directions on the package or prescription label. Take your medicine at regular intervals. Do not take your medicine more often than directed. Talk to your pediatrician regarding the use of this medicine in children. While this drug may be prescribed for children as young as 36 years of age for selected conditions, precautions do apply. Overdosage: If you think you have taken too much of this medicine contact a poison control [name] or emergency room at once. NOTE: This medicine is only for you. Do not share this medicine with others. What if I miss a dose? If you miss a dose, take it as soon as you can. If it is almost time for your next dose, take only that dose. Do not take double or extra  doses. What may interact with this medicine? -alcohol -imatinib -isoniazid -other medicines with acetaminophen This list may not describe all possible interactions. Give your health care provider a list of all the medicines, herbs, non-prescription drugs, or dietary supplements you use. Also tell them if you smoke, drink alcohol, or use illegal drugs. Some items may interact with your medicine. What should I watch for while using this medicine? Tell your doctor or health care professional if the pain lasts more than 10 days (5 days for children), if it gets worse, or if there is a new or different kind of pain. Also, check with your doctor if a fever lasts for more than 3 days. Do not take other medicines that contain acetaminophen with this medicine. Always read labels carefully. If you have questions, ask your doctor or pharmacist. If  you take too much acetaminophen get medical help right away. Too much acetaminophen can be very dangerous and cause liver damage. Even if you do not have symptoms, it is important to get help right away. What side effects may I notice from receiving this medicine? Side effects that you should report to your doctor or health care professional as soon as possible: -allergic reactions like skin rash, itching or hives, swelling of the face, lips, or tongue -breathing problems -fever or sore throat -redness, blistering, peeling or loosening of the skin, including inside the mouth -trouble passing urine or change in the amount of urine -unusual bleeding or bruising -unusually weak or tired -yellowing of the eyes or skin Side effects that usually do not require medical attention (report to your doctor or health care professional if they continue or are bothersome): -headache -nausea, stomach upset This list may not describe all possible side effects. Call your doctor for medical advice about side effects. You may report side effects to FDA at 1-800-FDA-1088. Where  should I keep my medicine? Keep out of reach of children. Store at room temperature between 20 and 25 degrees C (68 and 77 degrees F). Protect from moisture and heat. Throw away any unused medicine after the expiration date. NOTE: This sheet is a summary. It may not cover all possible information. If you have questions about this medicine, talk to your doctor, pharmacist, or health care provider.  2014, [employer]/Gold Standard. (2012-04-24 11:24:31) Diphenhydramine capsules or tablets What is this medicine? DIPHENHYDRAMINE (dye fen HYE dra meen) is an antihistamine. It is used to treat the symptoms of an allergic reaction. It is also used to treat Parkinson's disease. This medicine is also used to prevent and to treat motion sickness and as a nighttime sleep aid. This medicine may be used for other purposes; ask your health care provider or pharmacist if you have questions. COMMON BRAND NAME(S): Alka-Seltzer Plus Allergy, Banophen , Benadryl Allergy Dye Free, Benadryl Allergy Kapgel, Benadryl Allergy Ultratab, Benadryl Allergy, Diphedryl , Diphenhist, Genahist , Q-Dryl, Veto Kemps, Valu-Dryl , Vicks ZzzQuil Nightime Sleep-Aid What should I tell my health care provider before I take this medicine? They need to know if you have any of these conditions: -asthma or lung disease -glaucoma -high blood pressure or heart disease -liver disease -pain or difficulty passing urine -prostate trouble -ulcers or other stomach problems -an unusual or allergic reaction to diphenhydramine, other medicines foods, dyes, or preservatives such as sulfites -pregnant or trying to get pregnant -breast-feeding How should I use this medicine? Take this medicine by mouth with a full glass of water. Follow the directions on the prescription label. Take your doses at regular intervals. Do not take your medicine more often than directed. To prevent motion sickness start taking this medicine 30 to 60 minutes before you  leave. Talk to your pediatrician regarding the use of this medicine in children. Special care may be needed. Patients over 38 years old may have a stronger reaction and need a smaller dose. Overdosage: If you think you have taken too much of this medicine contact a poison control [name] or emergency room at once. NOTE: This medicine is only for you. Do not share this medicine with others. What if I miss a dose? If you miss a dose, take it as soon as you can. If it is almost time for your next dose, take only that dose. Do not take double or extra doses. What may interact with this medicine? Do not take this  medicine with any of the following medications: -MAOIs like Carbex, Eldepryl, Marplan, Nardil, and Parnate This medicine may also interact with the following medications: -alcohol -barbiturates, like phenobarbital -medicines for bladder spasm like oxybutynin, tolterodine -medicines for blood pressure -medicines for depression, anxiety, or psychotic disturbances -medicines for movement abnormalities or Parkinson's disease -medicines for sleep -other medicines for cold, cough or allergy -some medicines for the stomach like chlordiazepoxide, dicyclomine This list may not describe all possible interactions. Give your health care provider a list of all the medicines, herbs, non-prescription drugs, or dietary supplements you use. Also tell them if you smoke, drink alcohol, or use illegal drugs. Some items may interact with your medicine. What should I watch for while using this medicine? Visit your doctor or health care professional for regular check ups. Tell your doctor if your symptoms do not improve or if they get worse. Your mouth may get dry. Chewing sugarless gum or sucking hard candy, and drinking plenty of water may help. Contact your doctor if the problem does not go away or is severe. This medicine may cause dry eyes and blurred vision. If you wear contact lenses you may feel some  discomfort. Lubricating drops may help. See your eye doctor if the problem does not go away or is severe. You may get drowsy or dizzy. Do not drive, use machinery, or do anything that needs mental alertness until you know how this medicine affects you. Do not stand or sit up quickly, especially if you are an older patient. This reduces the risk of dizzy or fainting spells. Alcohol may interfere with the effect of this medicine. Avoid alcoholic drinks. What side effects may I notice from receiving this medicine? Side effects that you should report to your doctor or health care professional as soon as possible: -allergic reactions like skin rash, itching or hives, swelling of the face, lips, or tongue -changes in vision -confused, agitated, nervous -irregular or fast heartbeat -tremor -trouble passing urine -unusual bleeding or bruising -unusually weak or tired Side effects that usually do not require medical attention (report to your doctor or health care professional if they continue or are bothersome): -constipation, diarrhea -drowsy -headache -loss of appetite -stomach upset, vomiting -thick mucous This list may not describe all possible side effects. Call your doctor for medical advice about side effects. You may report side effects to FDA at 1-800-FDA-1088. Where should I keep my medicine? Keep out of the reach of children. Store at room temperature between 15 and 30 degrees C (59 and 86 degrees F). Keep container closed tightly. Throw away any unused medicine after the expiration date. NOTE: This sheet is a summary. It may not cover all possible information. If you have questions about this medicine, talk to your doctor, pharmacist, or health care provider.  2014, [employer] Standard. (2007-12-28 17:06:22) Prochlorperazine tablets What is this medicine? PROCHLORPERAZINE (proe klor PER a zeen) helps to control severe nausea and vomiting. This medicine is also used to treat  schizophrenia. It can also help patients who experience anxiety that is not due to psychological illness. This medicine may be used for other purposes; ask your health care provider or pharmacist if you have questions. COMMON BRAND NAME(S): Compazine What should I tell my health care provider before I take this medicine? They need to know if you have any of these conditions: -blood disorders or disease -dementia -liver disease or jaundice -Parkinson's disease -uncontrollable movement disorder -an unusual or allergic reaction to prochlorperazine, other medicines, foods, dyes, or  preservatives -pregnant or trying to get pregnant -breast-feeding How should I use this medicine? Take this medicine by mouth with a glass of water. Follow the directions on the prescription label. Take your doses at regular intervals. Do not take your medicine more often than directed. Do not stop taking this medicine suddenly. This can cause nausea, vomiting, and dizziness. Ask your doctor or health care professional for advice. Talk to your pediatrician regarding the use of this medicine in children. Special care may be needed. While this drug may be prescribed for children as young as 2 years for selected conditions, precautions do apply. Overdosage: If you think you have taken too much of this medicine contact a poison control [name] or emergency room at once. NOTE: This medicine is only for you. Do not share this medicine with others. What if I miss a dose? If you miss a dose, take it as soon as you can. If it is almost time for your next dose, take only that dose. Do not take double or extra doses. What may interact with this medicine? Do not take this medicine with any of the following medications: -amoxapine -antidepressants like citalopram, escitalopram, fluoxetine, paroxetine, and sertraline -deferoxamine -dofetilide -maprotiline -tricyclic antidepressants like amitriptyline, clomipramine, imipramine,  nortiptyline and others This medicine may also interact with the following medications: -lithium -medicines for pain -phenytoin -propranolol -warfarin This list may not describe all possible interactions. Give your health care provider a list of all the medicines, herbs, non-prescription drugs, or dietary supplements you use. Also tell them if you smoke, drink alcohol, or use illegal drugs. Some items may interact with your medicine. What should I watch for while using this medicine? Visit your doctor or health care professional for regular checks on your progress. You may get drowsy or dizzy. Do not drive, use machinery, or do anything that needs mental alertness until you know how this medicine affects you. Do not stand or sit up quickly, especially if you are an older patient. This reduces the risk of dizzy or fainting spells. Alcohol may interfere with the effect of this medicine. Avoid alcoholic drinks. This medicine can reduce the response of your body to heat or cold. Dress warm in cold weather and stay hydrated in hot weather. If possible, avoid extreme temperatures like saunas, hot tubs, very hot or cold showers, or activities that can cause dehydration such as vigorous exercise. This medicine can make you more sensitive to the sun. Keep out of the sun. If you cannot avoid being in the sun, wear protective clothing and use sunscreen. Do not use sun lamps or tanning beds/booths. Your mouth may get dry. Chewing sugarless gum or sucking hard candy, and drinking plenty of water may help. Contact your doctor if the problem does not go away or is severe. What side effects may I notice from receiving this medicine? Side effects that you should report to your doctor or health care professional as soon as possible: -blurred vision -breast enlargement in men or women -breast milk in women who are not breast-feeding -chest pain, fast or irregular heartbeat -confusion, restlessness -dark yellow or  brown urine -difficulty breathing or swallowing -dizziness or fainting spells -drooling, shaking, movement difficulty (shuffling walk) or rigidity -fever, chills, sore throat -involuntary or uncontrollable movements of the eyes, mouth, head, arms, and legs -seizures -stomach area pain -unusually weak or tired -unusual bleeding or bruising -yellowing of skin or eyes Side effects that usually do not require medical attention (report to your doctor or  health care professional if they continue or are bothersome): -difficulty passing urine -difficulty sleeping -headache -sexual dysfunction -skin rash, or itching This list may not describe all possible side effects. Call your doctor for medical advice about side effects. You may report side effects to FDA at 1-800-FDA-1088. Where should I keep my medicine? Keep out of the reach of children. Store at room temperature between 15 and 30 degrees C (59 and 86 degrees F). Protect from light. Throw away any unused medicine after the expiration date. NOTE: This sheet is a summary. It may not cover all possible information. If you have questions about this medicine, talk to your doctor, pharmacist, or health care provider.  2014, [employer] Standard. (2012-01-28 16:59:39) Metoclopramide tablets What is this medicine? METOCLOPRAMIDE (met oh kloe PRA mide) is used to treat the symptoms of gastroesophageal reflux disease (GERD) like heartburn. It is also used to treat people with slow emptying of the stomach and intestinal tract. This medicine may be used for other purposes; ask your health care provider or pharmacist if you have questions. COMMON BRAND NAME(S): Reglan What should I tell my health care provider before I take this medicine? They need to know if you have any of these conditions: -breast cancer -depression -diabetes -heart failure -high blood pressure -kidney disease -liver disease -Parkinson's disease or a movement  disorder -pheochromocytoma -seizures -stomach obstruction, bleeding, or perforation -an unusual or allergic reaction to metoclopramide, procainamide, sulfites, other medicines, foods, dyes, or preservatives -pregnant or trying to get pregnant -breast-feeding How should I use this medicine? Take this medicine by mouth with a glass of water. Follow the directions on the prescription label. Take this medicine on an empty stomach, about 30 minutes before eating. Take your doses at regular intervals. Do not take your medicine more often than directed. Do not stop taking except on the advice of your doctor or health care professional. A special MedGuide will be given to you by the pharmacist with each prescription and refill. Be sure to read this information carefully each time. Talk to your pediatrician regarding the use of this medicine in children. Special care may be needed. Overdosage: If you think you have taken too much of this medicine contact a poison control [name] or emergency room at once. NOTE: This medicine is only for you. Do not share this medicine with others. What if I miss a dose? If you miss a dose, take it as soon as you can. If it is almost time for your next dose, take only that dose. Do not take double or extra doses. What may interact with this medicine? -acetaminophen -cyclosporine -digoxin -medicines for blood pressure -medicines for diabetes, including insulin -medicines for hay fever and other allergies -medicines for depression, especially an Monoamine Oxidase Inhibitor (MAOI) -medicines for Parkinson's disease, like levodopa -medicines for sleep or for pain -tetracycline This list may not describe all possible interactions. Give your health care provider a list of all the medicines, herbs, non-prescription drugs, or dietary supplements you use. Also tell them if you smoke, drink alcohol, or use illegal drugs. Some items may interact with your medicine. What should  I watch for while using this medicine? It may take a few weeks for your stomach condition to start to get better. However, do not take this medicine for longer than 12 weeks. The longer you take this medicine, and the more you take it, the greater your chances are of developing serious side effects. If you are an elderly patient, a male  patient, or you have diabetes, you may be at an increased risk for side effects from this medicine. Contact your doctor immediately if you start having movements you cannot control such as lip smacking, rapid movements of the tongue, involuntary or uncontrollable movements of the eyes, head, arms and legs, or muscle twitches and spasms. Patients and their families should watch out for worsening depression or thoughts of suicide. Also watch out for any sudden or severe changes in feelings such as feeling anxious, agitated, panicky, irritable, hostile, aggressive, impulsive, severely restless, overly excited and hyperactive, or not being able to sleep. If this happens, especially at the beginning of treatment or after a change in dose, call your doctor. Do not treat yourself for high fever. Ask your doctor or health care professional for advice. You may get drowsy or dizzy. Do not drive, use machinery, or do anything that needs mental alertness until you know how this drug affects you. Do not stand or sit up quickly, especially if you are an older patient. This reduces the risk of dizzy or fainting spells. Alcohol can make you more drowsy and dizzy. Avoid alcoholic drinks. What side effects may I notice from receiving this medicine? Side effects that you should report to your doctor or health care professional as soon as possible: -allergic reactions like skin rash, itching or hives, swelling of the face, lips, or tongue -abnormal production of milk in females -breast enlargement in both males and females -change in the way you walk -difficulty moving, speaking or  swallowing -drooling, lip smacking, or rapid movements of the tongue -excessive sweating -fever -involuntary or uncontrollable movements of the eyes, head, arms and legs -irregular heartbeat or palpitations -muscle twitches and spasms -unusually weak or tired Side effects that usually do not require medical attention (report to your doctor or health care professional if they continue or are bothersome): -change in sex drive or performance -depressed mood -diarrhea -difficulty sleeping -headache -menstrual changes -restless or nervous This list may not describe all possible side effects. Call your doctor for medical advice about side effects. You may report side effects to FDA at 1-800-FDA-1088. Where should I keep my medicine? Keep out of the reach of children. Store at room temperature between 20 and 25 degrees C (68 and 77 degrees F). Protect from light. Keep container tightly closed. Throw away any unused medicine after the expiration date. NOTE: This sheet is a summary. It may not cover all possible information. If you have questions about this medicine, talk to your doctor, pharmacist, or health care provider.  2014, [employer]. (2012-01-07 13:04:38)

## 2013-09-09 ENCOUNTER — Other Ambulatory Visit: Payer: Self-pay | Admitting: Radiology

## 2013-09-09 ENCOUNTER — Other Ambulatory Visit (HOSPITAL_COMMUNITY): Payer: Self-pay | Admitting: Hematology and Oncology

## 2013-09-09 ENCOUNTER — Telehealth (HOSPITAL_COMMUNITY): Payer: Self-pay | Admitting: Radiology

## 2013-09-09 DIAGNOSIS — E119 Type 2 diabetes mellitus without complications: Secondary | ICD-10-CM | POA: Diagnosis not present

## 2013-09-09 DIAGNOSIS — E785 Hyperlipidemia, unspecified: Secondary | ICD-10-CM | POA: Diagnosis not present

## 2013-09-09 DIAGNOSIS — D518 Other vitamin B12 deficiency anemias: Secondary | ICD-10-CM | POA: Diagnosis not present

## 2013-09-09 DIAGNOSIS — I10 Essential (primary) hypertension: Secondary | ICD-10-CM | POA: Diagnosis not present

## 2013-09-09 NOTE — Telephone Encounter (Signed)
Pt called to verify appt for Mon 12-22 and questioned a CT appt that has been canceled or rescheduled.  I went over instructions for portacath, arrive at 0730, must have a driver, npo after mdnt, take half of usual insulin dose in am.

## 2013-09-10 ENCOUNTER — Encounter (HOSPITAL_BASED_OUTPATIENT_CLINIC_OR_DEPARTMENT_OTHER): Payer: Medicare Other

## 2013-09-10 ENCOUNTER — Encounter (HOSPITAL_COMMUNITY): Payer: Self-pay | Admitting: Pharmacy Technician

## 2013-09-10 DIAGNOSIS — C8319 Mantle cell lymphoma, extranodal and solid organ sites: Secondary | ICD-10-CM

## 2013-09-10 DIAGNOSIS — G4733 Obstructive sleep apnea (adult) (pediatric): Secondary | ICD-10-CM

## 2013-09-10 DIAGNOSIS — C8310 Mantle cell lymphoma, unspecified site: Secondary | ICD-10-CM

## 2013-09-10 DIAGNOSIS — Z8719 Personal history of other diseases of the digestive system: Secondary | ICD-10-CM

## 2013-09-10 DIAGNOSIS — C61 Malignant neoplasm of prostate: Secondary | ICD-10-CM

## 2013-09-10 NOTE — Progress Notes (Signed)
Chemo teaching done and consent signed for Rituxan & Bendamustine. Med/chemo calendar given to patient. Meds already called in to [place].

## 2013-09-13 ENCOUNTER — Ambulatory Visit (HOSPITAL_COMMUNITY)
Admission: RE | Admit: 2013-09-13 | Discharge: 2013-09-13 | Disposition: A | Discharge status: Home / Self Care | Payer: Medicare Other | Source: Ambulatory Visit | Attending: Hematology and Oncology | Admitting: Hematology and Oncology

## 2013-09-13 ENCOUNTER — Encounter (HOSPITAL_COMMUNITY): Payer: Self-pay

## 2013-09-13 ENCOUNTER — Other Ambulatory Visit (HOSPITAL_COMMUNITY): Payer: Self-pay | Admitting: Hematology and Oncology

## 2013-09-13 DIAGNOSIS — E782 Mixed hyperlipidemia: Secondary | ICD-10-CM | POA: Insufficient documentation

## 2013-09-13 DIAGNOSIS — I739 Peripheral vascular disease, unspecified: Secondary | ICD-10-CM | POA: Insufficient documentation

## 2013-09-13 DIAGNOSIS — Z79899 Other long term (current) drug therapy: Secondary | ICD-10-CM | POA: Insufficient documentation

## 2013-09-13 DIAGNOSIS — I10 Essential (primary) hypertension: Secondary | ICD-10-CM | POA: Diagnosis not present

## 2013-09-13 DIAGNOSIS — Z794 Long term (current) use of insulin: Secondary | ICD-10-CM | POA: Diagnosis not present

## 2013-09-13 DIAGNOSIS — G4733 Obstructive sleep apnea (adult) (pediatric): Secondary | ICD-10-CM

## 2013-09-13 DIAGNOSIS — C8310 Mantle cell lymphoma, unspecified site: Secondary | ICD-10-CM

## 2013-09-13 DIAGNOSIS — C61 Malignant neoplasm of prostate: Secondary | ICD-10-CM | POA: Diagnosis not present

## 2013-09-13 DIAGNOSIS — E119 Type 2 diabetes mellitus without complications: Secondary | ICD-10-CM | POA: Diagnosis not present

## 2013-09-13 DIAGNOSIS — I2510 Atherosclerotic heart disease of native coronary artery without angina pectoris: Secondary | ICD-10-CM

## 2013-09-13 DIAGNOSIS — Z8719 Personal history of other diseases of the digestive system: Secondary | ICD-10-CM

## 2013-09-13 DIAGNOSIS — K627 Radiation proctitis: Secondary | ICD-10-CM

## 2013-09-13 DIAGNOSIS — C8319 Mantle cell lymphoma, extranodal and solid organ sites: Secondary | ICD-10-CM | POA: Insufficient documentation

## 2013-09-13 LAB — CBC
HCT: 44.1 % (ref 39.0–52.0)
Hemoglobin: 15.2 g/dL (ref 13.0–17.0)
MCH: 30.5 pg (ref 26.0–34.0)
MCHC: 34.5 g/dL (ref 30.0–36.0)
Platelets: 129 10*3/uL — ABNORMAL LOW (ref 150–400)
RDW: 14.1 % (ref 11.5–15.5)
WBC: 7.7 10*3/uL (ref 4.0–10.5)

## 2013-09-13 LAB — PROTIME-INR
INR: 0.98 (ref 0.00–1.49)
Prothrombin Time: 12.8 seconds (ref 11.6–15.2)

## 2013-09-13 MED ORDER — FENTANYL CITRATE 0.05 MG/ML IJ SOLN
INTRAMUSCULAR | Status: AC
  Filled 2013-09-13: qty 6

## 2013-09-13 MED ORDER — HEPARIN SOD (PORK) LOCK FLUSH 100 UNIT/ML IV SOLN
INTRAVENOUS | Status: AC
  Filled 2013-09-13: qty 5

## 2013-09-13 MED ORDER — LIDOCAINE-EPINEPHRINE (PF) 2 %-1:200000 IJ SOLN
INTRAMUSCULAR | Status: AC
  Filled 2013-09-13: qty 20

## 2013-09-13 MED ORDER — CEFAZOLIN SODIUM-DEXTROSE 2-3 GM-% IV SOLR
2.0000 g | INTRAVENOUS | Status: AC
  Administered 2013-09-13: 2 g via INTRAVENOUS
  Filled 2013-09-13: qty 50

## 2013-09-13 MED ORDER — MIDAZOLAM HCL 2 MG/2ML IJ SOLN
INTRAMUSCULAR | Status: AC | PRN
  Administered 2013-09-13 (×2): 0.5 mg via INTRAVENOUS
  Administered 2013-09-13: 1 mg via INTRAVENOUS
  Administered 2013-09-13: 0.5 mg via INTRAVENOUS

## 2013-09-13 MED ORDER — HEPARIN SOD (PORK) LOCK FLUSH 100 UNIT/ML IV SOLN
INTRAVENOUS | Status: AC | PRN
  Administered 2013-09-13: 500 [IU]

## 2013-09-13 MED ORDER — LIDOCAINE HCL 1 % IJ SOLN
INTRAMUSCULAR | Status: AC
  Filled 2013-09-13: qty 20

## 2013-09-13 MED ORDER — SODIUM CHLORIDE 0.9 % IV SOLN
INTRAVENOUS | Status: DC
  Administered 2013-09-13: 08:00:00 via INTRAVENOUS

## 2013-09-13 MED ORDER — MIDAZOLAM HCL 2 MG/2ML IJ SOLN
INTRAMUSCULAR | Status: AC
  Filled 2013-09-13: qty 6

## 2013-09-13 MED ORDER — FENTANYL CITRATE 0.05 MG/ML IJ SOLN
INTRAMUSCULAR | Status: AC | PRN
  Administered 2013-09-13 (×3): 25 ug via INTRAVENOUS
  Administered 2013-09-13: 50 ug via INTRAVENOUS
  Administered 2013-09-13: 25 ug via INTRAVENOUS

## 2013-09-13 NOTE — H&P (Signed)
Chief Complaint: "I'm here for a  Port" Referring Physician:Formanek HPI: [name] is an 78 y.o. male with mantle cell lymphoma. He is to begin chemotherapy tomorrow. He is referred for port placement. PMHx and meds reviewed. Feeling well otherwise, no recent illness.  Past Medical History:  Past Medical History  Diagnosis Date  . Type 2 diabetes mellitus   . 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 Mixed hyperlipidemia   . PVD (peripheral vascular disease)   . Essential hypertension, benign   . Prostate cancer   . Obstructive sleep apnea     On CPAP    Past Surgical History:  Past Surgical History  Procedure Laterality Date  . Tonsillectomy    . Back surgery    . Vasectomy    . Knee surgery      Right  . Shoulder surgery    . Prostatectomy    . Colonoscopy with esophagogastroduodenoscopy (egd) N/A 08/27/2013    Procedure: COLONOSCOPY WITH ESOPHAGOGASTRODUODENOSCOPY (EGD);  Surgeon: Malissa Hippo, MD;  Location: [facility];  Service: Endoscopy;  Laterality: N/A;  730  . Balloon dilation N/A 08/27/2013    Procedure: BALLOON DILATION;  Surgeon: Malissa Hippo, MD;  Location: [facility];  Service: Endoscopy;  Laterality: N/A;  Elease Hashimoto dilation N/A 08/27/2013    Procedure: Elease Hashimoto DILATION;  Surgeon: Malissa Hippo, MD;  Location: [facility];  Service: Endoscopy;  Laterality: N/A;  . Savory dilation N/A 08/27/2013    Procedure: SAVORY DILATION;  Surgeon: Malissa Hippo, MD;  Location: [facility];  Service: Endoscopy;  Laterality: N/A;    Family History:  Family History  Problem Relation Age of Onset  . Colon cancer Brother   . Cancer Brother   . Diabetes Father   . Cancer Brother     Social History:  reports that he has never smoked. He has never used smokeless tobacco. He reports that he does not drink alcohol or use illicit drugs.  Allergies:   Allergies  Allergen Reactions  . Bee Venom Anaphylaxis  . Adhesive [Tape] Hives  . Ciprofloxacin     Unknown   . Codeine     Unknown    . Iodine     Unknown    . Latex     Unknown    . Povidone     Unknown    . Simvastatin     Unknown      Medications:   Medication List    ASK your doctor about these medications       acetaminophen 325 MG tablet  Commonly known as:  TYLENOL  Take 650 mg by mouth every 4 (four) hours as needed for mild pain or moderate pain. Take 2 tablets 1 hour prior to Rituxan treatment.     amLODipine 5 MG tablet  Commonly known as:  NORVASC  Take 5 mg by mouth daily with breakfast.     atorvastatin 40 MG tablet  Commonly known as:  LIPITOR  Take 40 mg by mouth 2 (two) times daily.     diazepam 5 MG tablet  Commonly known as:  VALIUM  Take 5 mg by mouth every 6 (six) hours as needed (sleep). Take one tablet at bedtime if needed for sleep.     diphenhydrAMINE 25 MG tablet  Commonly known as:  BENADRYL  Take 25 mg by mouth daily  as needed. Take 2 tablets 1 hour prior to Rituxan treatment.     LANTUS SOLOSTAR 100 UNIT/ML injection  Generic drug:  insulin glargine  Inject 50 Units into the skin every morning.     lidocaine-prilocaine cream  Commonly known as:  EMLA  Apply 1 application topically daily as needed (chemo). Apply a quarter size amount to port site 1 hour prior to chemo. Do not rub in. Cover with plastic wrap.     lisinopril 40 MG tablet  Commonly known as:  PRINIVIL,ZESTRIL  Take 40 mg by mouth daily with breakfast.     metFORMIN 1000 MG tablet  Commonly known as:  GLUCOPHAGE  Take 1,000 mg by mouth 2 (two) times daily with a meal.     metoCLOPramide 5 MG tablet  Commonly known as:  REGLAN  Take 1 tablet (5 mg total) by mouth 4 (four) times daily as needed for nausea.     oxyCODONE 5 MG immediate release tablet  Commonly known as:  Oxy IR/ROXICODONE  Take 5 mg by mouth every 4 (four) hours as needed for moderate  pain or severe pain.     pantoprazole 40 MG tablet  Commonly known as:  PROTONIX  Take 1 tablet (40 mg total) by mouth daily.     prochlorperazine 10 MG tablet  Commonly known as:  COMPAZINE  Take 1 tablet (10 mg total) by mouth every 6 (six) hours as needed for nausea or vomiting.     VITAMIN B-12 IJ  Inject as directed every 30 (thirty) days.        Please HPI for pertinent positives, otherwise complete 10 system ROS negative.  Physical Exam: Temp: 98, HR: 66, RR: 18, BP: 153/80   General Appearance:  Alert, cooperative, no distress, appears stated age  Head:  Normocephalic, without obvious abnormality, atraumatic  ENT: Unremarkable  Neck: Supple, symmetrical, trachea midline  Lungs:   Clear to auscultation bilaterally, no w/r/r,   Chest Wall:  No tenderness or deformity  Heart:  Regular rate and rhythm, S1, S2 normal, no murmur, rub or gallop.  Abdomen:   Soft, non-tender, non distended.  Neurologic: Normal affect, no gross deficits.   Results for orders placed during the hospital encounter of 09/13/13 (from the past 48 hour(s))  APTT     Status: None   Collection Time    09/13/13  7:55 AM      Result Value Range   aPTT 34  24 - 37 seconds  CBC     Status: Abnormal   Collection Time    09/13/13  7:55 AM      Result Value Range   WBC 7.7  4.0 - 10.5 K/uL   RBC 4.98  4.22 - 5.81 MIL/uL   Hemoglobin 15.2  13.0 - 17.0 g/dL   HCT 40.9  81.1 - 91.4 %   MCV 88.6  78.0 - 100.0 fL   MCH 30.5  26.0 - 34.0 pg   MCHC 34.5  30.0 - 36.0 g/dL   RDW 78.2  95.6 - 21.3 %   Platelets 129 (*) 150 - 400 K/uL   Comment: SPECIMEN CHECKED FOR CLOTS     REPEATED TO VERIFY  PROTIME-INR     Status: None   Collection Time    09/13/13  7:55 AM      Result Value Range   Prothrombin Time 12.8  11.6 - 15.2 seconds   INR 0.98  0.00 - 1.49   No results found.  Assessment/Plan  Lymphoma Discussed port placement. Explained procedure, risks, complications, use of sedation. Labs  reviewed. Consent signed in chart  Brayton El PA-C 09/13/2013, 8:38 AM

## 2013-09-13 NOTE — Procedures (Signed)
Successful RT IJ POWER PORT TIP SVC/RA NO COMP STABLE READY FOR USE FULL REPORT IN PACS  

## 2013-09-14 ENCOUNTER — Other Ambulatory Visit (HOSPITAL_COMMUNITY): Payer: Self-pay | Admitting: Hematology and Oncology

## 2013-09-14 ENCOUNTER — Encounter (HOSPITAL_BASED_OUTPATIENT_CLINIC_OR_DEPARTMENT_OTHER): Payer: Medicare Other

## 2013-09-14 VITALS — BP 128/68 | HR 88 | Temp 98.3°F | Resp 16 | Wt 225.1 lb

## 2013-09-14 DIAGNOSIS — K219 Gastro-esophageal reflux disease without esophagitis: Secondary | ICD-10-CM | POA: Diagnosis not present

## 2013-09-14 DIAGNOSIS — I2510 Atherosclerotic heart disease of native coronary artery without angina pectoris: Secondary | ICD-10-CM

## 2013-09-14 DIAGNOSIS — I10 Essential (primary) hypertension: Secondary | ICD-10-CM | POA: Diagnosis not present

## 2013-09-14 DIAGNOSIS — C8319 Mantle cell lymphoma, extranodal and solid organ sites: Secondary | ICD-10-CM

## 2013-09-14 DIAGNOSIS — Z8719 Personal history of other diseases of the digestive system: Secondary | ICD-10-CM

## 2013-09-14 DIAGNOSIS — C61 Malignant neoplasm of prostate: Secondary | ICD-10-CM

## 2013-09-14 DIAGNOSIS — G4733 Obstructive sleep apnea (adult) (pediatric): Secondary | ICD-10-CM

## 2013-09-14 DIAGNOSIS — K627 Radiation proctitis: Secondary | ICD-10-CM

## 2013-09-14 DIAGNOSIS — Z5112 Encounter for antineoplastic immunotherapy: Secondary | ICD-10-CM | POA: Diagnosis not present

## 2013-09-14 DIAGNOSIS — C8310 Mantle cell lymphoma, unspecified site: Secondary | ICD-10-CM

## 2013-09-14 DIAGNOSIS — Z8546 Personal history of malignant neoplasm of prostate: Secondary | ICD-10-CM | POA: Diagnosis not present

## 2013-09-14 DIAGNOSIS — Z5111 Encounter for antineoplastic chemotherapy: Secondary | ICD-10-CM | POA: Diagnosis not present

## 2013-09-14 LAB — CBC WITH DIFFERENTIAL/PLATELET
Basophils Relative: 0 % (ref 0–1)
Eosinophils Relative: 1 % (ref 0–5)
HCT: 44.6 % (ref 39.0–52.0)
Lymphocytes Relative: 27 % (ref 12–46)
Lymphs Abs: 1.9 10*3/uL (ref 0.7–4.0)
MCV: 88.8 fL (ref 78.0–100.0)
Monocytes Absolute: 0.4 10*3/uL (ref 0.1–1.0)
Monocytes Relative: 6 % (ref 3–12)
Neutrophils Relative %: 65 % (ref 43–77)
RBC: 5.02 MIL/uL (ref 4.22–5.81)
WBC: 7 10*3/uL (ref 4.0–10.5)

## 2013-09-14 LAB — COMPREHENSIVE METABOLIC PANEL
ALT: 14 U/L (ref 0–53)
Alkaline Phosphatase: 55 U/L (ref 39–117)
CO2: 28 mEq/L (ref 19–32)
GFR calc Af Amer: 66 mL/min — ABNORMAL LOW (ref >90)
GFR calc non Af Amer: 57 mL/min — ABNORMAL LOW (ref >90)
Glucose, Bld: 137 mg/dL — ABNORMAL HIGH (ref 70–99)
Potassium: 4.2 mEq/L (ref 3.5–5.1)
Sodium: 138 mEq/L (ref 135–145)
Total Bilirubin: 0.7 mg/dL (ref 0.3–1.2)
Total Protein: 7.2 g/dL (ref 6.0–8.3)

## 2013-09-14 MED ORDER — CYANOCOBALAMIN 1000 MCG/ML IJ SOLN
1000.0000 ug | Freq: Once | INTRAMUSCULAR | Status: AC
  Administered 2013-09-14: 1000 ug via INTRAMUSCULAR

## 2013-09-14 MED ORDER — CYANOCOBALAMIN 1000 MCG/ML IJ SOLN
INTRAMUSCULAR | Status: AC
  Filled 2013-09-14: qty 1

## 2013-09-14 MED ORDER — SODIUM CHLORIDE 0.9 % IV SOLN: 8.0000 mg | Freq: Once | INTRAVENOUS | Status: DC

## 2013-09-14 MED ORDER — HEPARIN SOD (PORK) LOCK FLUSH 100 UNIT/ML IV SOLN
500.0000 [IU] | Freq: Once | INTRAVENOUS | Status: AC | PRN
  Administered 2013-09-14: 500 [IU]

## 2013-09-14 MED ORDER — SODIUM CHLORIDE 0.9 % IV SOLN
375.0000 mg/m2 | Freq: Once | INTRAVENOUS | Status: AC
  Administered 2013-09-14: 900 mg via INTRAVENOUS
  Filled 2013-09-14: qty 90

## 2013-09-14 MED ORDER — DIPHENHYDRAMINE HCL 25 MG PO CAPS: 50.0000 mg | ORAL_CAPSULE | Freq: Once | ORAL | Status: DC

## 2013-09-14 MED ORDER — ACETAMINOPHEN 325 MG PO TABS: 650.0000 mg | ORAL_TABLET | Freq: Once | ORAL | Status: DC

## 2013-09-14 MED ORDER — HEPARIN SOD (PORK) LOCK FLUSH 100 UNIT/ML IV SOLN
INTRAVENOUS | Status: AC
  Filled 2013-09-14: qty 5

## 2013-09-14 MED ORDER — SODIUM CHLORIDE 0.9 % IJ SOLN
10.0000 mL | INTRAMUSCULAR | Status: DC | PRN
  Administered 2013-09-14: 10 mL

## 2013-09-14 MED ORDER — DEXAMETHASONE SODIUM PHOSPHATE 10 MG/ML IJ SOLN: 10.0000 mg | Freq: Once | INTRAMUSCULAR | Status: DC

## 2013-09-14 MED ORDER — SODIUM CHLORIDE 0.9 % IV SOLN
Freq: Once | INTRAVENOUS | Status: AC
  Administered 2013-09-14: 09:00:00 via INTRAVENOUS

## 2013-09-14 MED ORDER — SODIUM CHLORIDE 0.9 % IV SOLN
Freq: Once | INTRAVENOUS | Status: AC
  Administered 2013-09-14: 8 mg via INTRAVENOUS
  Filled 2013-09-14: qty 4

## 2013-09-14 MED ORDER — SODIUM CHLORIDE 0.9 % IV SOLN
90.0000 mg/m2 | Freq: Once | INTRAVENOUS | Status: AC
  Administered 2013-09-14: 205 mg via INTRAVENOUS
  Filled 2013-09-14: qty 41

## 2013-09-14 NOTE — Progress Notes (Signed)
Tolerated cycle 1 day1 week 1 well.Home ambulatory accompanied by spouse.

## 2013-09-14 NOTE — Progress Notes (Signed)
[facility]  OFFICE PROGRESS NOTE  [place], [facility], MD  [address] [place] [address]  DIAGNOSIS: No diagnosis found.  Chief Complaint  Patient presents with  . Lymphoma    Mantle cell lymphoma involving the colon    CURRENT THERAPY: To begin treatment for mantle cell lymphoma involving the colon utilizing bendamustine and Rituxan with Neulasta support.  INTERVAL HISTORY: [name] 78 y.o. male returns for initiation of chemotherapy with BR plus Neulasta for mantle cell lymphoma involving the colon.  He still has intermittent rectal bleeding with left lower quadrant abdominal pain. He denies any nausea, vomiting, but does awaken 3 or 4 times per night to urinate. He denies any PND, orthopnea, palpitations, abdominal distention, diarrhea, constipation, lower extremity swelling or redness, joint pain, peripheral paresthesias, skin rash, headache, or seizures.  MEDICAL HISTORY: Past Medical History  Diagnosis Date  . Type 2 diabetes mellitus   . 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 Mixed hyperlipidemia   . PVD (peripheral vascular disease)   . Essential hypertension, benign   . Prostate cancer   . Obstructive sleep apnea     On CPAP    INTERIM HISTORY: has Mixed hyperlipidemia; HYPERTENSION, BENIGN; Coronary atherosclerosis of native coronary artery; VASOMOTOR RHINITIS; GERD; OSA (obstructive sleep apnea); H/O ulcerative colitis; Mantle cell lymphoma; Prostate cancer; and Proctitis, radiation on his problem list.    ALLERGIES:  is allergic to bee venom; adhesive; ciprofloxacin; codeine; iodine; latex; povidone; and simvastatin.  MEDICATIONS: has a current medication list which includes the following prescription(s): acetaminophen, amlodipine, atorvastatin, cyanocobalamin, diazepam, diphenhydramine, insulin glargine,  lidocaine-prilocaine, lisinopril, metformin, metoclopramide, oxycodone, pantoprazole, and prochlorperazine.  SURGICAL HISTORY:  Past Surgical History  Procedure Laterality Date  . Tonsillectomy    . Back surgery    . Vasectomy    . Knee surgery      Right  . Shoulder surgery    . Prostatectomy    . Colonoscopy with esophagogastroduodenoscopy (egd) N/A 08/27/2013    Procedure: COLONOSCOPY WITH ESOPHAGOGASTRODUODENOSCOPY (EGD);  Surgeon: Malissa Hippo, MD;  Location: [facility];  Service: Endoscopy;  Laterality: N/A;  730  . Balloon dilation N/A 08/27/2013    Procedure: BALLOON DILATION;  Surgeon: Malissa Hippo, MD;  Location: [facility];  Service: Endoscopy;  Laterality: N/A;  Elease Hashimoto dilation N/A 08/27/2013    Procedure: Elease Hashimoto DILATION;  Surgeon: Malissa Hippo, MD;  Location: [facility];  Service: Endoscopy;  Laterality: N/A;  . Savory dilation N/A 08/27/2013    Procedure: SAVORY DILATION;  Surgeon: Malissa Hippo, MD;  Location: [facility];  Service: Endoscopy;  Laterality: N/A;    FAMILY HISTORY: family history includes Cancer in his brother and brother; Colon cancer in his brother; Diabetes in his father.  SOCIAL HISTORY:  reports that he has never smoked. He has never used smokeless tobacco. He reports that he does not drink alcohol or use illicit drugs.  REVIEW OF SYSTEMS:  Other than that discussed above is noncontributory.  PHYSICAL EXAMINATION: ECOG PERFORMANCE STATUS: 1 - Symptomatic but completely ambulatory  There were no vitals taken for this visit.  GENERAL:alert, no distress and comfortable SKIN: skin color, texture, turgor are normal, no rashes or significant lesions EYES: PERLA; Conjunctiva are pink and non-injected, sclera clear OROPHARYNX:no exudate, no erythema on lips, buccal  mucosa, or tongue. NECK: supple, thyroid normal size, non-tender, without nodularity. No masses CHEST: Normal AP diameter with light port in place in the right  anterior chest. LYMPH:  no palpable lymphadenopathy in the cervical, axillary or inguinal LUNGS: clear to auscultation and percussion with normal breathing effort HEART: regular rate & rhythm and no murmurs. ABDOMEN:abdomen soft, non-tender and normal bowel sounds MUSCULOSKELETAL:no cyanosis of digits and no clubbing. Range of motion normal.  NEURO: alert & oriented x 3 with fluent speech, no focal motor/sensory deficits   LABORATORY DATA: Hospital Outpatient Visit on 09/13/2013  Component Date Value Range Status  . Glucose-Capillary 09/13/2013 143* 70 - 99 mg/dL Final  . Comment 1 16/06/9603 Documented in Chart   Final  Hospital Outpatient Visit on 09/13/2013  Component Date Value Range Status  . aPTT 09/13/2013 34  24 - 37 seconds Final  . WBC 09/13/2013 7.7  4.0 - 10.5 K/uL Final  . RBC 09/13/2013 4.98  4.22 - 5.81 MIL/uL Final  . Hemoglobin 09/13/2013 15.2  13.0 - 17.0 g/dL Final  . HCT 54/05/8118 44.1  39.0 - 52.0 % Final  . MCV 09/13/2013 88.6  78.0 - 100.0 fL Final  . MCH 09/13/2013 30.5  26.0 - 34.0 pg Final  . MCHC 09/13/2013 34.5  30.0 - 36.0 g/dL Final  . RDW 14/78/2956 14.1  11.5 - 15.5 % Final  . Platelets 09/13/2013 129* 150 - 400 K/uL Final   Comment: SPECIMEN CHECKED FOR CLOTS                          REPEATED TO VERIFY  . Prothrombin Time 09/13/2013 12.8  11.6 - 15.2 seconds Final  . INR 09/13/2013 0.98  0.00 - 1.49 Final  Office Visit on 09/07/2013  Component Date Value Range Status  . Hep B C IgM 09/07/2013 NON REACTIVE  NON REACTIVE Final   Comment: (NOTE)                          High levels of Hepatitis B Core IgM antibody are detectable                          during the acute stage of Hepatitis B. This antibody is used                          to differentiate current from past HBV infection.                          Performed at [facility]  . Hepatitis B Surface Ag 09/07/2013 NEGATIVE  NEGATIVE Final   Performed at [facility]    Infusion on 09/03/2013  Component Date Value Range Status  . WBC 09/03/2013 6.3  4.0 - 10.5 K/uL Final  . RBC 09/03/2013 5.30  4.22 - 5.81 MIL/uL Final  . Hemoglobin 09/03/2013 16.0  13.0 - 17.0 g/dL Final  . HCT 21/30/8657 47.8  39.0 - 52.0 % Final  . MCV 09/03/2013 90.2  78.0 - 100.0 fL Final  . MCH 09/03/2013 30.2  26.0 - 34.0 pg Final  . MCHC 09/03/2013 33.5  30.0 - 36.0 g/dL Final  . RDW 84/69/6295 14.3  11.5 - 15.5 % Final  . Platelets 09/03/2013 137* 150 - 400 K/uL Final  . Neutrophils Relative % 09/03/2013 66  43 - 77 % Final  . Neutro Abs 09/03/2013 4.2  1.7 - 7.7 K/uL Final  . Lymphocytes Relative 09/03/2013 25  12 - 46 % Final  . Lymphs Abs 09/03/2013 1.6  0.7 - 4.0 K/uL Final  . Monocytes Relative 09/03/2013 6  3 - 12 % Final  . Monocytes Absolute 09/03/2013 0.4  0.1 - 1.0 K/uL Final  . Eosinophils Relative 09/03/2013 2  0 - 5 % Final  . Eosinophils Absolute 09/03/2013 0.1  0.0 - 0.7 K/uL Final  . Basophils Relative 09/03/2013 1  0 - 1 % Final  . Basophils Absolute 09/03/2013 0.0  0.0 - 0.1 K/uL Final  . Sodium 09/03/2013 138  135 - 145 mEq/L Final  . Potassium 09/03/2013 3.8  3.5 - 5.1 mEq/L Final  . Chloride 09/03/2013 97  96 - 112 mEq/L Final  . CO2 09/03/2013 30  19 - 32 mEq/L Final  . Glucose, Bld 09/03/2013 165* 70 - 99 mg/dL Final  . BUN 16/06/9603 17  6 - 23 mg/dL Final  . Creatinine, Ser 09/03/2013 1.23  0.50 - 1.35 mg/dL Final  . Calcium 54/05/8118 9.3  8.4 - 10.5 mg/dL Final  . Total Protein 09/03/2013 7.2  6.0 - 8.3 g/dL Final  . Albumin 14/78/2956 3.9  3.5 - 5.2 g/dL Final  . AST 21/30/8657 20  0 - 37 U/L Final  . ALT 09/03/2013 16  0 - 53 U/L Final  . Alkaline Phosphatase 09/03/2013 53  39 - 117 U/L Final  . Total Bilirubin 09/03/2013 1.0  0.3 - 1.2 mg/dL Final  . GFR calc non Af Amer 09/03/2013 56* >90 mL/min Final  . GFR calc Af Amer 09/03/2013 65* >90 mL/min Final   Comment: (NOTE)                          The eGFR has been calculated using the CKD  EPI equation.                          This calculation has not been validated in all clinical situations.                          eGFR's persistently <90 mL/min signify possible Chronic Kidney                          Disease.  [name] LDH 09/03/2013 136  94 - 250 U/L Final  . Beta-2 Microglobulin 09/03/2013 2.97* 1.01 - 1.73 mg/L Final   Performed at [facility]  Admission on 08/27/2013, Discharged on 08/27/2013  Component Date Value Range Status  . Glucose-Capillary 08/27/2013 138* 70 - 99 mg/dL Final    PATHOLOGY:  Diagnosis  1. Colon, biopsy, random  - MANTLE CELL LYMPHOMA, SEE COMMENT.  2. Colon, biopsy, sigmoid  - POLYPOID FRAGMENTS OF COLONIC MUCOSA WITH LYMPHOID AGGREGATES.  Microscopic Comment  1. The biopsy shows polypoid colonic mucosa with atypical lymphoproliferation arranged in a nodular pattern.  Immunohistochemical stains were performed and atypical small lymphocytes are strongly positive for  CD20, CD79a, CD5, Cyclin D-1 and BCL-2. Ki-67 demonstrates no significant increased staining. CD43 is  equivocal. The atypical lymphocytes are negative for CD10 and CD3. The overall findings are mostly  consistent with Mantle cell lymphoma. The case was discussed with Dr. Lionel December on 08/31/2013. Dr.  Laureen Ochs agrees.  2. There  is no evidence of atypical lymphoproliferation in this material. No dysplasia or malignancy is identified.  Abigail Miyamoto MD  Pathologist, Electronic Signature  (Case signed 12/10/    Urinalysis    Component Value Date/Time   COLORURINE YELLOW 09/19/2007 1940   APPEARANCEUR CLEAR 09/19/2007 1940   LABSPEC 1.020 09/19/2007 1940   PHURINE 5.5 09/19/2007 1940   GLUCOSEU >1000* 09/19/2007 1940   HGBUR TRACE* 09/19/2007 1940   BILIRUBINUR NEGATIVE 09/19/2007 1940   KETONESUR NEGATIVE 09/19/2007 1940   PROTEINUR NEGATIVE 09/19/2007 1940   UROBILINOGEN 0.2 09/19/2007 1940   NITRITE NEGATIVE 09/19/2007 1940   LEUKOCYTESUR NEGATIVE 09/19/2007  1940    RADIOGRAPHIC STUDIES: Ct Chest W Contrast  09/06/2013   CLINICAL DATA:  New diagnosis of mantle cell lymphoma. Staging scan.  EXAM: CT CHEST, ABDOMEN, AND PELVIS WITH CONTRAST  TECHNIQUE: Multidetector CT imaging of the chest, abdomen and pelvis was performed following the standard protocol during bolus administration of intravenous contrast.  CONTRAST:  OMNIPAQUE IOHEXOL 300 MG/ML  SOLN  COMPARISON:  CT of the chest 08/25/2007. CT of the abdomen and pelvis 08/16/2008.  FINDINGS: CT CHEST FINDINGS  Mediastinum: Heart size is normal. There is no significant pericardial fluid, thickening or pericardial calcification. No pathologically enlarged mediastinal or hilar lymph nodes. Small hiatal hernia.  Lungs/Pleura: 5 mm ground-glass attenuation nodule in the lateral segment of the right middle lobe (image 40 of series 3). Other scattered peripheral 1-2 mm pulmonary nodules in the lungs bilaterally are highly nonspecific, but are most compatible with areas of very mild mucoid impaction within terminal bronchioles. No larger more suspicious appearing pulmonary nodules or masses are otherwise noted. No acute consolidative airspace disease. No pleural effusions.  Musculoskeletal: Orthopedic fixation hardware in the lower cervical spine incompletely visualized. There are no aggressive appearing lytic or blastic lesions noted in the visualized portions of the skeleton.  CT ABDOMEN AND PELVIS FINDINGS  Abdomen/Pelvis: The appearance of the liver, gallbladder, pancreas, spleen and bilateral adrenal glands is unremarkable. Multiple low-attenuation renal lesions bilaterally are generally too small to definitively characterize. Of these renal lesions, the ones larger than 1 cm are all compatible with simple cysts, with the largest of these extending off the lower pole of the left kidney measuring 2.6 cm in diameter.  There are numerous nonenlarged retroperitoneal lymph nodes which are conspicuous in number  rather than size. No definite pathologically enlarged lymph nodes are identified within the abdomen or pelvis on today's examination. Atherosclerosis throughout the abdominal and pelvic vasculature, without definite aneurysm. Numerous colonic diverticulae are noted, most pronounced in the region of the proximal sigmoid colon and distal descending colon, without surrounding inflammatory changes to suggest an acute diverticulitis at this time. No significant volume of ascites. No pneumoperitoneum. No pathologic distention of small bowel. Status post radical prostatectomy. Penile implant with reservoir in the left side of the pelvis anteriorly.  Musculoskeletal: There are no aggressive appearing lytic or blastic lesions noted in the visualized portions of the skeleton.  IMPRESSION: 1. No significant lymphadenopathy identified within the chest, abdomen or pelvis. 2. 5 mm ground-glass attenuation sub solid nodule in the right middle lobe. Nodules of this size and appearance do not require specific imaging followup. This recommendation follows the consensus statement: Recommendations for the Management of Subsolid Pulmonary Nodules Detected at CT: A Statement from the Fleischner Society as published in Radiology 2013; 266:304-317. 3. Multiple low-attenuation renal lesions bilaterally, the majority of which are too small to definitively characterize. These lesions are all  favored to represent cysts, but would require MRI with and without IV gadolinium for definitive characterization if of clinical concern. 4. Colonic diverticulosis without findings to suggest acute diverticulitis at this time.   Electronically Signed   By: Trudie Reed M.D.   On: 09/06/2013 17:46   Ct Abdomen Pelvis W Contrast  09/06/2013   CLINICAL DATA:  New diagnosis of mantle cell lymphoma. Staging scan.  EXAM: CT CHEST, ABDOMEN, AND PELVIS WITH CONTRAST  TECHNIQUE: Multidetector CT imaging of the chest, abdomen and pelvis was performed  following the standard protocol during bolus administration of intravenous contrast.  CONTRAST:  OMNIPAQUE IOHEXOL 300 MG/ML  SOLN  COMPARISON:  CT of the chest 08/25/2007. CT of the abdomen and pelvis 08/16/2008.  FINDINGS: CT CHEST FINDINGS  Mediastinum: Heart size is normal. There is no significant pericardial fluid, thickening or pericardial calcification. No pathologically enlarged mediastinal or hilar lymph nodes. Small hiatal hernia.  Lungs/Pleura: 5 mm ground-glass attenuation nodule in the lateral segment of the right middle lobe (image 40 of series 3). Other scattered peripheral 1-2 mm pulmonary nodules in the lungs bilaterally are highly nonspecific, but are most compatible with areas of very mild mucoid impaction within terminal bronchioles. No larger more suspicious appearing pulmonary nodules or masses are otherwise noted. No acute consolidative airspace disease. No pleural effusions.  Musculoskeletal: Orthopedic fixation hardware in the lower cervical spine incompletely visualized. There are no aggressive appearing lytic or blastic lesions noted in the visualized portions of the skeleton.  CT ABDOMEN AND PELVIS FINDINGS  Abdomen/Pelvis: The appearance of the liver, gallbladder, pancreas, spleen and bilateral adrenal glands is unremarkable. Multiple low-attenuation renal lesions bilaterally are generally too small to definitively characterize. Of these renal lesions, the ones larger than 1 cm are all compatible with simple cysts, with the largest of these extending off the lower pole of the left kidney measuring 2.6 cm in diameter.  There are numerous nonenlarged retroperitoneal lymph nodes which are conspicuous in number rather than size. No definite pathologically enlarged lymph nodes are identified within the abdomen or pelvis on today's examination. Atherosclerosis throughout the abdominal and pelvic vasculature, without definite aneurysm. Numerous colonic diverticulae are noted, most  pronounced in the region of the proximal sigmoid colon and distal descending colon, without surrounding inflammatory changes to suggest an acute diverticulitis at this time. No significant volume of ascites. No pneumoperitoneum. No pathologic distention of small bowel. Status post radical prostatectomy. Penile implant with reservoir in the left side of the pelvis anteriorly.  Musculoskeletal: There are no aggressive appearing lytic or blastic lesions noted in the visualized portions of the skeleton.  IMPRESSION: 1. No significant lymphadenopathy identified within the chest, abdomen or pelvis. 2. 5 mm ground-glass attenuation sub solid nodule in the right middle lobe. Nodules of this size and appearance do not require specific imaging followup. This recommendation follows the consensus statement: Recommendations for the Management of Subsolid Pulmonary Nodules Detected at CT: A Statement from the Fleischner Society as published in Radiology 2013; 266:304-317. 3. Multiple low-attenuation renal lesions bilaterally, the majority of which are too small to definitively characterize. These lesions are all favored to represent cysts, but would require MRI with and without IV gadolinium for definitive characterization if of clinical concern. 4. Colonic diverticulosis without findings to suggest acute diverticulitis at this time.   Electronically Signed   By: Trudie Reed M.D.   On: 09/06/2013 17:46   Ir Fluoro Guide Cv Line Right  09/13/2013   CLINICAL DATA:  Mantle  cell lymphoma, access for chemotherapy  EXAM: RIGHT INTERNAL JUGULAR SINGLE LUMEN POWER PORT CATHETER INSERTION  Date:  09/13/2013 10:39 AM12/22/2014 10:39 AM  Radiologist:  Judie Petit. Ruel Favors, MD  Guidance:  Ultrasound and fluoroscopic  MEDICATIONS AND MEDICAL HISTORY: 2 g Ancefadministered within 1 hour of the procedure.2.5 mg Versed, 125 mcg fentanyl  ANESTHESIA/SEDATION: 35 min  CONTRAST:  None.  FLUOROSCOPY TIME:  24 seconds  PROCEDURE: Informed consent  was obtained from the patient following explanation of the procedure, risks, benefits and alternatives. The patient understands, agrees and consents for the procedure. All questions were addressed. A time out was performed.  Maximal barrier sterile technique utilized including caps, mask, sterile gowns, sterile gloves, large sterile drape, hand hygiene, and 2% chlorhexidine scrub.  Under sterile conditions and local anesthesia, right internal jugular micropuncture venous access was performed. Access was performed with ultrasound. Images were obtained for documentation. A guide wire was inserted followed by a transitional dilator. This allowed insertion of a guide wire and catheter into the IVC. Measurements were obtained from the SVC / RA junction back to the right IJ venotomy site. In the right infraclavicular chest, a subcutaneous pocket was created over the second anterior rib. This was done under sterile conditions and local anesthesia. 1% lidocaine with epinephrine was utilized for this. A 2.5 cm incision was made in the skin. Blunt dissection was performed to create a subcutaneous pocket over the right pectoralis major muscle. The pocket was flushed with saline vigorously. There was adequate hemostasis. The port catheter was assembled and checked for leakage. The port catheter was secured in the pocket with two retention sutures. The tubing was tunneled subcutaneously to the right venotomy site and inserted into the SVC/RA junction through a valved peel-away sheath. Position was confirmed with fluoroscopy. Images were obtained for documentation. The patient tolerated the procedure well. No immediate complications. Incisions were closed in a two layer fashion with 4 - 0 Vicryl suture. Dermabond was applied to the skin. The port catheter was accessed, blood was aspirated followed by saline and heparin flushes. Needle was removed. A dry sterile dressing was applied.  COMPLICATIONS: No immediate  IMPRESSION:  Ultrasound and fluoroscopically guided right internal jugular single lumen power port catheter insertion. Tip in the SVC/RA junction. Catheter ready for use.   Electronically Signed   By: Ruel Favors M.D.   On: 09/13/2013 10:56   Ir US Guide Vasc Access Right  09/13/2013   CLINICAL DATA:  Mantle cell lymphoma, access for chemotherapy  EXAM: RIGHT INTERNAL JUGULAR SINGLE LUMEN POWER PORT CATHETER INSERTION  Date:  09/13/2013 10:39 AM12/22/2014 10:39 AM  Radiologist:  Judie Petit. Ruel Favors, MD  Guidance:  Ultrasound and fluoroscopic  MEDICATIONS AND MEDICAL HISTORY: 2 g Ancefadministered within 1 hour of the procedure.2.5 mg Versed, 125 mcg fentanyl  ANESTHESIA/SEDATION: 35 min  CONTRAST:  None.  FLUOROSCOPY TIME:  24 seconds  PROCEDURE: Informed consent was obtained from the patient following explanation of the procedure, risks, benefits and alternatives. The patient understands, agrees and consents for the procedure. All questions were addressed. A time out was performed.  Maximal barrier sterile technique utilized including caps, mask, sterile gowns, sterile gloves, large sterile drape, hand hygiene, and 2% chlorhexidine scrub.  Under sterile conditions and local anesthesia, right internal jugular micropuncture venous access was performed. Access was performed with ultrasound. Images were obtained for documentation. A guide wire was inserted followed by a transitional dilator. This allowed insertion of a guide wire and catheter into the IVC. Measurements  were obtained from the SVC / RA junction back to the right IJ venotomy site. In the right infraclavicular chest, a subcutaneous pocket was created over the second anterior rib. This was done under sterile conditions and local anesthesia. 1% lidocaine with epinephrine was utilized for this. A 2.5 cm incision was made in the skin. Blunt dissection was performed to create a subcutaneous pocket over the right pectoralis major muscle. The pocket was flushed with saline  vigorously. There was adequate hemostasis. The port catheter was assembled and checked for leakage. The port catheter was secured in the pocket with two retention sutures. The tubing was tunneled subcutaneously to the right venotomy site and inserted into the SVC/RA junction through a valved peel-away sheath. Position was confirmed with fluoroscopy. Images were obtained for documentation. The patient tolerated the procedure well. No immediate complications. Incisions were closed in a two layer fashion with 4 - 0 Vicryl suture. Dermabond was applied to the skin. The port catheter was accessed, blood was aspirated followed by saline and heparin flushes. Needle was removed. A dry sterile dressing was applied.  COMPLICATIONS: No immediate  IMPRESSION: Ultrasound and fluoroscopically guided right internal jugular single lumen power port catheter insertion. Tip in the SVC/RA junction. Catheter ready for use.   Electronically Signed   By: Ruel Favors M.D.   On: 09/13/2013 10:56    ASSESSMENT:  #1.Stage I mantle cell lymphoma involving the colon.  #2. History of prostate cancer, status post radical prostatectomy followed by postop radiotherapy given by external beam technique to 2 incomplete resection, last PSA 0.05.  #3. History of radiation induced proctitis, stable.  #4. History of segmental ulcerative colitis, possibly symptomatic now.  #5. History of obstructive sleep apnea syndrome, status post CPAP use, currently no longer using.  #6. Gastroesophageal reflux disease, on treatment.  #7. Coronary artery disease, no evidence of heart failure or dysrhythmia.  #8. Hypertension, controlled.     PLAN:  #1. Cycle 1 day 1 of BR today. #2. Patient warned about the potential bone pain associated with Neulasta which will be given on day 3 of cycle 1. #3. Followup for tolerability and blood count check in 7-10 days.   All questions were answered. The patient knows to call the clinic with any problems,  questions or concerns. We can certainly see the patient much sooner if necessary.   I spent 25 minutes counseling the patient face to face. The total time spent in the appointment was 30 minutes.    Maurilio Lovely, MD 09/14/2013 8:27 AM

## 2013-09-14 NOTE — Progress Notes (Signed)
Pt reports he pre-medicated with Tylenol and benadryl at home prior to arrival for today's appointment.

## 2013-09-15 ENCOUNTER — Encounter (HOSPITAL_BASED_OUTPATIENT_CLINIC_OR_DEPARTMENT_OTHER): Payer: Medicare Other

## 2013-09-15 VITALS — BP 128/79 | HR 83 | Temp 97.4°F | Resp 16 | Wt 228.2 lb

## 2013-09-15 DIAGNOSIS — G4733 Obstructive sleep apnea (adult) (pediatric): Secondary | ICD-10-CM

## 2013-09-15 DIAGNOSIS — Z5111 Encounter for antineoplastic chemotherapy: Secondary | ICD-10-CM | POA: Diagnosis not present

## 2013-09-15 DIAGNOSIS — C8310 Mantle cell lymphoma, unspecified site: Secondary | ICD-10-CM

## 2013-09-15 DIAGNOSIS — C8319 Mantle cell lymphoma, extranodal and solid organ sites: Secondary | ICD-10-CM

## 2013-09-15 DIAGNOSIS — C61 Malignant neoplasm of prostate: Secondary | ICD-10-CM

## 2013-09-15 DIAGNOSIS — Z8719 Personal history of other diseases of the digestive system: Secondary | ICD-10-CM

## 2013-09-15 MED ORDER — ONDANSETRON HCL 40 MG/20ML IJ SOLN
Freq: Once | INTRAMUSCULAR | Status: AC
  Administered 2013-09-15: 8 mg via INTRAVENOUS
  Filled 2013-09-15: qty 4

## 2013-09-15 MED ORDER — SODIUM CHLORIDE 0.9 % IV SOLN
Freq: Once | INTRAVENOUS | Status: AC
  Administered 2013-09-15: 09:00:00 via INTRAVENOUS

## 2013-09-15 MED ORDER — HEPARIN SOD (PORK) LOCK FLUSH 100 UNIT/ML IV SOLN
500.0000 [IU] | Freq: Once | INTRAVENOUS | Status: AC | PRN
  Administered 2013-09-15: 500 [IU]

## 2013-09-15 MED ORDER — SODIUM CHLORIDE 0.9 % IJ SOLN
10.0000 mL | INTRAMUSCULAR | Status: DC | PRN
  Administered 2013-09-15: 10 mL

## 2013-09-15 MED ORDER — HEPARIN SOD (PORK) LOCK FLUSH 100 UNIT/ML IV SOLN
INTRAVENOUS | Status: AC
  Filled 2013-09-15: qty 5

## 2013-09-15 MED ORDER — SODIUM CHLORIDE 0.9 % IV SOLN: 8.0000 mg | Freq: Once | INTRAVENOUS | Status: DC

## 2013-09-15 MED ORDER — DEXAMETHASONE SODIUM PHOSPHATE 10 MG/ML IJ SOLN: 10.0000 mg | Freq: Once | INTRAMUSCULAR | Status: DC

## 2013-09-15 MED ORDER — SODIUM CHLORIDE 0.9 % IV SOLN
90.0000 mg/m2 | Freq: Once | INTRAVENOUS | Status: AC
  Administered 2013-09-15: 205 mg via INTRAVENOUS
  Filled 2013-09-15: qty 41

## 2013-09-15 NOTE — Progress Notes (Signed)
Pt states did ok with Tuesday's chemo.  Denies feelings of nausea or other symptoms.   Tolerated chemo well today.  D/C from clinic to home accompanied by spouse.

## 2013-09-16 ENCOUNTER — Encounter (HOSPITAL_BASED_OUTPATIENT_CLINIC_OR_DEPARTMENT_OTHER): Payer: Medicare Other

## 2013-09-16 VITALS — BP 124/73 | HR 88 | Temp 97.2°F | Resp 16

## 2013-09-16 DIAGNOSIS — Z5189 Encounter for other specified aftercare: Secondary | ICD-10-CM

## 2013-09-16 DIAGNOSIS — C8310 Mantle cell lymphoma, unspecified site: Secondary | ICD-10-CM

## 2013-09-16 DIAGNOSIS — C8319 Mantle cell lymphoma, extranodal and solid organ sites: Secondary | ICD-10-CM | POA: Diagnosis not present

## 2013-09-16 MED ORDER — PEGFILGRASTIM INJECTION 6 MG/0.6ML
6.0000 mg | Freq: Once | SUBCUTANEOUS | Status: AC
  Administered 2013-09-16: 6 mg via SUBCUTANEOUS

## 2013-09-16 MED ORDER — PEGFILGRASTIM INJECTION 6 MG/0.6ML
SUBCUTANEOUS | Status: AC
  Filled 2013-09-16: qty 0.6

## 2013-09-16 NOTE — Progress Notes (Signed)
[name] presents today for injection per the provider's orders.  Neulasta administered without incident; see MAR for injection details.  Patient tolerated procedure well and without incident.  Also states he tolerated last 2 days of chemotherapy w/o incident.  No questions or complaints noted at this time.

## 2013-09-18 ENCOUNTER — Ambulatory Visit (HOSPITAL_COMMUNITY): Payer: Medicare Other

## 2013-09-21 ENCOUNTER — Encounter (HOSPITAL_COMMUNITY): Payer: Self-pay

## 2013-09-21 ENCOUNTER — Other Ambulatory Visit (HOSPITAL_COMMUNITY): Payer: Self-pay | Admitting: Hematology and Oncology

## 2013-09-21 ENCOUNTER — Other Ambulatory Visit (HOSPITAL_COMMUNITY): Payer: Medicare Other

## 2013-09-21 ENCOUNTER — Encounter (HOSPITAL_BASED_OUTPATIENT_CLINIC_OR_DEPARTMENT_OTHER): Payer: Medicare Other

## 2013-09-21 ENCOUNTER — Encounter (HOSPITAL_COMMUNITY): Payer: Medicare Other | Attending: Hematology and Oncology

## 2013-09-21 VITALS — BP 108/72 | HR 75 | Temp 98.1°F | Resp 16 | Wt 222.4 lb

## 2013-09-21 DIAGNOSIS — C61 Malignant neoplasm of prostate: Secondary | ICD-10-CM

## 2013-09-21 DIAGNOSIS — C8319 Mantle cell lymphoma, extranodal and solid organ sites: Secondary | ICD-10-CM | POA: Diagnosis not present

## 2013-09-21 DIAGNOSIS — G4733 Obstructive sleep apnea (adult) (pediatric): Secondary | ICD-10-CM

## 2013-09-21 DIAGNOSIS — Z8719 Personal history of other diseases of the digestive system: Secondary | ICD-10-CM

## 2013-09-21 DIAGNOSIS — Z8546 Personal history of malignant neoplasm of prostate: Secondary | ICD-10-CM | POA: Diagnosis not present

## 2013-09-21 DIAGNOSIS — C8310 Mantle cell lymphoma, unspecified site: Secondary | ICD-10-CM

## 2013-09-21 DIAGNOSIS — D72829 Elevated white blood cell count, unspecified: Secondary | ICD-10-CM

## 2013-09-21 LAB — CBC WITH DIFFERENTIAL/PLATELET
Basophils Absolute: 0.1 10*3/uL (ref 0.0–0.1)
Basophils Relative: 0 % (ref 0–1)
Eosinophils Absolute: 0.2 10*3/uL (ref 0.0–0.7)
Eosinophils Relative: 1 % (ref 0–5)
HCT: 46 % (ref 39.0–52.0)
Lymphocytes Relative: 1 % — ABNORMAL LOW (ref 12–46)
MCHC: 33.9 g/dL (ref 30.0–36.0)
MCV: 89.8 fL (ref 78.0–100.0)
Monocytes Absolute: 1.2 10*3/uL — ABNORMAL HIGH (ref 0.1–1.0)
Neutro Abs: 18.3 10*3/uL — ABNORMAL HIGH (ref 1.7–7.7)
Platelets: 130 10*3/uL — ABNORMAL LOW (ref 150–400)
RBC: 5.12 MIL/uL (ref 4.22–5.81)
RDW: 14.5 % (ref 11.5–15.5)
WBC: 20 10*3/uL — ABNORMAL HIGH (ref 4.0–10.5)

## 2013-09-21 MED ORDER — ESCITALOPRAM OXALATE 10 MG PO TABS: 10.0000 mg | ORAL_TABLET | Freq: Every day | ORAL | Status: DC

## 2013-09-21 NOTE — Progress Notes (Signed)
[facility]  OFFICE PROGRESS NOTE  [place], [facility], MD  [address] [place] [address]  DIAGNOSIS: Mantle cell lymphoma - Plan: CBC with Differential  H/O ulcerative colitis - Plan: CBC with Differential  OSA (obstructive sleep apnea) - Plan: CBC with Differential  Prostate cancer - Plan: CBC with Differential  Chief Complaint  Patient presents with  . Lymphoma    Colonic mantle cell lymphoma, status post BR x1    CURRENT THERAPY: Bendamustine/Rituxan followed by Neulasta, 1 week after cycle 1.  INTERVAL HISTORY: [name] 78 y.o. male returns for followup of mantle cell lymphoma involving the colon, status post chemotherapy with bendamustine/Rituxan followed by Neulasta with treatment initiated 8 days ago.  He did develop bone pain from Neulasta successfully treated with analgesics. Cells have lower abdominal discomfort without any further rectal bleeding. He denies any fever, night sweats, sore throat, or continuous diarrhea. He is currently on no treatment for his colitis. His eyes are itchy and he has had some peripheral neuropathic symptoms of the lower extremities which are not new symptoms. Appetite has been fair. He feels fatigued and has a flat affect today. He denies a cough, wheezing, skin rash, headache, or seizures.  MEDICAL HISTORY: Past Medical History  Diagnosis Date  . Type 2 diabetes mellitus   . 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 Mixed hyperlipidemia   . PVD (peripheral vascular disease)   . Essential hypertension, benign   . Prostate cancer   . Obstructive sleep apnea     On CPAP    INTERIM HISTORY: has Mixed hyperlipidemia; HYPERTENSION, BENIGN; Coronary atherosclerosis of native coronary artery; VASOMOTOR RHINITIS; GERD; OSA (obstructive sleep apnea); H/O ulcerative colitis; Mantle cell lymphoma;  Prostate cancer; and Proctitis, radiation on his problem list.    ALLERGIES:  is allergic to bee venom; adhesive; ciprofloxacin; codeine; iodine; latex; povidone; and simvastatin.  MEDICATIONS: has a current medication list which includes the following prescription(s): acetaminophen, amlodipine, atorvastatin, cyanocobalamin, diazepam, diphenhydramine, docusate calcium, insulin glargine, lidocaine-prilocaine, lisinopril, metformin, metoclopramide, oxycodone, pantoprazole, prochlorperazine, and escitalopram.  SURGICAL HISTORY:  Past Surgical History  Procedure Laterality Date  . Tonsillectomy    . Back surgery    . Vasectomy    . Knee surgery      Right  . Shoulder surgery    . Prostatectomy    . Colonoscopy with esophagogastroduodenoscopy (egd) N/A 08/27/2013    Procedure: COLONOSCOPY WITH ESOPHAGOGASTRODUODENOSCOPY (EGD);  Surgeon: Malissa Hippo, MD;  Location: [facility];  Service: Endoscopy;  Laterality: N/A;  730  . Balloon dilation N/A 08/27/2013    Procedure: BALLOON DILATION;  Surgeon: Malissa Hippo, MD;  Location: [facility];  Service: Endoscopy;  Laterality: N/A;  Elease Hashimoto dilation N/A 08/27/2013    Procedure: Elease Hashimoto DILATION;  Surgeon: Malissa Hippo, MD;  Location: [facility];  Service: Endoscopy;  Laterality: N/A;  . Savory dilation N/A 08/27/2013    Procedure: SAVORY DILATION;  Surgeon: Malissa Hippo, MD;  Location: [facility];  Service: Endoscopy;  Laterality: N/A;  . Portacath placement Right 2014    FAMILY HISTORY: family history includes Cancer in his brother and brother; Colon cancer in his brother; Diabetes in his father.  SOCIAL HISTORY:  reports that he has never smoked. He has never used smokeless tobacco. He reports that  he does not drink alcohol or use illicit drugs.  REVIEW OF SYSTEMS:  Other than that discussed above is noncontributory.  PHYSICAL EXAMINATION: ECOG PERFORMANCE STATUS: 1 - Symptomatic but completely ambulatory  Blood  pressure 108/72, pulse 75, temperature 98.1 F (36.7 C), temperature source Oral, resp. rate 16, weight 222 lb 6.4 oz (100.88 kg).  GENERAL:alert, no distress and comfortable SKIN: skin color, texture, turgor are normal, no rashes or significant lesions EYES: PERLA; Conjunctiva are pink and non-injected, sclera clear OROPHARYNX:no exudate, no erythema on lips, buccal mucosa, or tongue. NECK: supple, thyroid normal size, non-tender, without nodularity. No masses CHEST: Normal AP diameter with life port in place in the right anterior chest. LYMPH:  no palpable lymphadenopathy in the cervical, axillary or inguinal LUNGS: clear to auscultation and percussion with normal breathing effort HEART: regular rate & rhythm and no murmurs. ABDOMEN:abdomen soft, non-tender and normal bowel sounds MUSCULOSKELETAL:no cyanosis of digits and no clubbing. Range of motion normal.  NEURO: alert & oriented x 3 with fluent speech, no focal motor/sensory deficits   LABORATORY DATA: Office Visit on 09/21/2013  Component Date Value Range Status  . WBC 09/21/2013 20.0* 4.0 - 10.5 K/uL Final  . RBC 09/21/2013 5.12  4.22 - 5.81 MIL/uL Final  . Hemoglobin 09/21/2013 15.6  13.0 - 17.0 g/dL Final  . HCT 16/06/9603 46.0  39.0 - 52.0 % Final  . MCV 09/21/2013 89.8  78.0 - 100.0 fL Final  . MCH 09/21/2013 30.5  26.0 - 34.0 pg Final  . MCHC 09/21/2013 33.9  30.0 - 36.0 g/dL Final  . RDW 54/05/8118 14.5  11.5 - 15.5 % Final  . Platelets 09/21/2013 130* 150 - 400 K/uL Final  . Neutrophils Relative % 09/21/2013 91* 43 - 77 % Final  . Neutro Abs 09/21/2013 18.3* 1.7 - 7.7 K/uL Final  . Lymphocytes Relative 09/21/2013 1* 12 - 46 % Final  . Lymphs Abs 09/21/2013 0.3* 0.7 - 4.0 K/uL Final  . Monocytes Relative 09/21/2013 6  3 - 12 % Final  . Monocytes Absolute 09/21/2013 1.2* 0.1 - 1.0 K/uL Final  . Eosinophils Relative 09/21/2013 1  0 - 5 % Final  . Eosinophils Absolute 09/21/2013 0.2  0.0 - 0.7 K/uL Final  . Basophils  Relative 09/21/2013 0  0 - 1 % Final  . Basophils Absolute 09/21/2013 0.1  0.0 - 0.1 K/uL Final  . WBC Morphology 09/21/2013 MILD LEFT SHIFT (1-5% METAS, OCC MYELO, OCC BANDS)   Final  Infusion on 09/14/2013  Component Date Value Range Status  . WBC 09/14/2013 7.0  4.0 - 10.5 K/uL Final  . RBC 09/14/2013 5.02  4.22 - 5.81 MIL/uL Final  . Hemoglobin 09/14/2013 15.3  13.0 - 17.0 g/dL Final  . HCT 14/78/2956 44.6  39.0 - 52.0 % Final  . MCV 09/14/2013 88.8  78.0 - 100.0 fL Final  . MCH 09/14/2013 30.5  26.0 - 34.0 pg Final  . MCHC 09/14/2013 34.3  30.0 - 36.0 g/dL Final  . RDW 21/30/8657 14.1  11.5 - 15.5 % Final  . Platelets 09/14/2013 124* 150 - 400 K/uL Final  . Neutrophils Relative % 09/14/2013 65  43 - 77 % Final  . Neutro Abs 09/14/2013 4.5  1.7 - 7.7 K/uL Final  . Lymphocytes Relative 09/14/2013 27  12 - 46 % Final  . Lymphs Abs 09/14/2013 1.9  0.7 - 4.0 K/uL Final  . Monocytes Relative 09/14/2013 6  3 - 12 % Final  . Monocytes Absolute 09/14/2013 0.4  0.1 - 1.0 K/uL Final  . Eosinophils Relative 09/14/2013 1  0 - 5 % Final  . Eosinophils Absolute 09/14/2013 0.1  0.0 - 0.7 K/uL Final  . Basophils Relative 09/14/2013 0  0 - 1 % Final  . Basophils Absolute 09/14/2013 0.0  0.0 - 0.1 K/uL Final  . Retic Ct Pct 09/14/2013 0.7  0.4 - 3.1 % Final  . RBC. 09/14/2013 5.02  4.22 - 5.81 MIL/uL Final  . Retic Count, Manual 09/14/2013 35.1  19.0 - 186.0 K/uL Final  . Sodium 09/14/2013 138  135 - 145 mEq/L Final  . Potassium 09/14/2013 4.2  3.5 - 5.1 mEq/L Final  . Chloride 09/14/2013 98  96 - 112 mEq/L Final  . CO2 09/14/2013 28  19 - 32 mEq/L Final  . Glucose, Bld 09/14/2013 137* 70 - 99 mg/dL Final  . BUN 16/06/9603 27* 6 - 23 mg/dL Final  . Creatinine, Ser 09/14/2013 1.20  0.50 - 1.35 mg/dL Final  . Calcium 54/05/8118 9.6  8.4 - 10.5 mg/dL Final  . Total Protein 09/14/2013 7.2  6.0 - 8.3 g/dL Final  . Albumin 14/78/2956 3.9  3.5 - 5.2 g/dL Final  . AST 21/30/8657 17  0 - 37 U/L Final    . ALT 09/14/2013 14  0 - 53 U/L Final  . Alkaline Phosphatase 09/14/2013 55  39 - 117 U/L Final  . Total Bilirubin 09/14/2013 0.7  0.3 - 1.2 mg/dL Final  . GFR calc non Af Amer 09/14/2013 57* >90 mL/min Final  . GFR calc Af Amer 09/14/2013 66* >90 mL/min Final   Comment: (NOTE)                          The eGFR has been calculated using the CKD EPI equation.                          This calculation has not been validated in all clinical situations.                          eGFR's persistently <90 mL/min signify possible Chronic Kidney                          Disease.  . Sed Rate 09/14/2013 2  0 - 16 mm/hr Final  Hospital Outpatient Visit on 09/13/2013  Component Date Value Range Status  . Glucose-Capillary 09/13/2013 143* 70 - 99 mg/dL Final  . Comment 1 84/69/6295 Documented in Chart   Final  Hospital Outpatient Visit on 09/13/2013  Component Date Value Range Status  . aPTT 09/13/2013 34  24 - 37 seconds Final  . WBC 09/13/2013 7.7  4.0 - 10.5 K/uL Final  . RBC 09/13/2013 4.98  4.22 - 5.81 MIL/uL Final  . Hemoglobin 09/13/2013 15.2  13.0 - 17.0 g/dL Final  . HCT 28/41/3244 44.1  39.0 - 52.0 % Final  . MCV 09/13/2013 88.6  78.0 - 100.0 fL Final  . MCH 09/13/2013 30.5  26.0 - 34.0 pg Final  . MCHC 09/13/2013 34.5  30.0 - 36.0 g/dL Final  . RDW 09/25/7251 14.1  11.5 - 15.5 % Final  . Platelets 09/13/2013 129* 150 - 400 K/uL Final   Comment: SPECIMEN CHECKED FOR CLOTS  REPEATED TO VERIFY  . Prothrombin Time 09/13/2013 12.8  11.6 - 15.2 seconds Final  . INR 09/13/2013 0.98  0.00 - 1.49 Final  Office Visit on 09/07/2013  Component Date Value Range Status  . Hep B C IgM 09/07/2013 NON REACTIVE  NON REACTIVE Final   Comment: (NOTE)                          High levels of Hepatitis B Core IgM antibody are detectable                          during the acute stage of Hepatitis B. This antibody is used                          to differentiate current from past  HBV infection.                          Performed at [facility]  . Hepatitis B Surface Ag 09/07/2013 NEGATIVE  NEGATIVE Final   Performed at [facility]  Infusion on 09/03/2013  Component Date Value Range Status  . WBC 09/03/2013 6.3  4.0 - 10.5 K/uL Final  . RBC 09/03/2013 5.30  4.22 - 5.81 MIL/uL Final  . Hemoglobin 09/03/2013 16.0  13.0 - 17.0 g/dL Final  . HCT 40/98/1191 47.8  39.0 - 52.0 % Final  . MCV 09/03/2013 90.2  78.0 - 100.0 fL Final  . MCH 09/03/2013 30.2  26.0 - 34.0 pg Final  . MCHC 09/03/2013 33.5  30.0 - 36.0 g/dL Final  . RDW 47/82/9562 14.3  11.5 - 15.5 % Final  . Platelets 09/03/2013 137* 150 - 400 K/uL Final  . Neutrophils Relative % 09/03/2013 66  43 - 77 % Final  . Neutro Abs 09/03/2013 4.2  1.7 - 7.7 K/uL Final  . Lymphocytes Relative 09/03/2013 25  12 - 46 % Final  . Lymphs Abs 09/03/2013 1.6  0.7 - 4.0 K/uL Final  . Monocytes Relative 09/03/2013 6  3 - 12 % Final  . Monocytes Absolute 09/03/2013 0.4  0.1 - 1.0 K/uL Final  . Eosinophils Relative 09/03/2013 2  0 - 5 % Final  . Eosinophils Absolute 09/03/2013 0.1  0.0 - 0.7 K/uL Final  . Basophils Relative 09/03/2013 1  0 - 1 % Final  . Basophils Absolute 09/03/2013 0.0  0.0 - 0.1 K/uL Final  . Sodium 09/03/2013 138  135 - 145 mEq/L Final  . Potassium 09/03/2013 3.8  3.5 - 5.1 mEq/L Final  . Chloride 09/03/2013 97  96 - 112 mEq/L Final  . CO2 09/03/2013 30  19 - 32 mEq/L Final  . Glucose, Bld 09/03/2013 165* 70 - 99 mg/dL Final  . BUN 13/04/6577 17  6 - 23 mg/dL Final  . Creatinine, Ser 09/03/2013 1.23  0.50 - 1.35 mg/dL Final  . Calcium 46/96/2952 9.3  8.4 - 10.5 mg/dL Final  . Total Protein 09/03/2013 7.2  6.0 - 8.3 g/dL Final  . Albumin 84/13/2440 3.9  3.5 - 5.2 g/dL Final  . AST 07/20/2535 20  0 - 37 U/L Final  . ALT 09/03/2013 16  0 - 53 U/L Final  . Alkaline Phosphatase 09/03/2013 53  39 - 117 U/L Final  . Total Bilirubin 09/03/2013 1.0  0.3 - 1.2 mg/dL Final  . GFR calc non Af  Denyse Dago  09/03/2013 56* >90 mL/min Final  . GFR calc Af Amer 09/03/2013 65* >90 mL/min Final   Comment: (NOTE)                          The eGFR has been calculated using the CKD EPI equation.                          This calculation has not been validated in all clinical situations.                          eGFR's persistently <90 mL/min signify possible Chronic Kidney                          Disease.  [name] LDH 09/03/2013 136  94 - 250 U/L Final  . Beta-2 Microglobulin 09/03/2013 2.97* 1.01 - 1.73 mg/L Final   Performed at [facility]  Admission on 08/27/2013, Discharged on 08/27/2013  Component Date Value Range Status  . Glucose-Capillary 08/27/2013 138* 70 - 99 mg/dL Final    PATHOLOGY: for AUBURN, HERT ([id number]) Patient: NAKUL, AVINO Collected: 08/27/2013 Client: [facility] Accession: [id number] Received: 08/27/2013 Lionel December DOB: 1935-03-21 Age: 61 Gender: M Reported: 09/01/2013 [address] Patient Ph: [phone] MRN #: [id number] [place] [address] Visit #: [id number] Chart #: Phone: [phone] Fax: CC: REPORT OF SURGICAL PATHOLOGY FINAL DIAGNOSIS Diagnosis 1. Colon, biopsy, random - MANTLE CELL LYMPHOMA, SEE COMMENT. 2. Colon, biopsy, sigmoid - POLYPOID FRAGMENTS OF COLONIC MUCOSA WITH LYMPHOID AGGREGATES. Microscopic Comment 1. The biopsy shows polypoid colonic mucosa with atypical lymphoproliferation arranged in a nodular pattern. Immunohistochemical stains were performed and atypical small lymphocytes are strongly positive for CD20, CD79a, CD5, Cyclin D-1 and BCL-2. Ki-67 demonstrates no significant increased staining. CD43 is equivocal. The atypical lymphocytes are negative for CD10 and CD3. The overall findings are mostly consistent with Mantle cell lymphoma. The case was discussed with Dr. Lionel December on 08/31/2013. Dr. Laureen Ochs agrees. 2. There is no evidence of atypical lymphoproliferation in this material. No dysplasia or  malignancy is identified. Abigail Miyamoto MD Pathologist, Electronic Signature (Case signed 09/01/2013) Specimen Gross and Clinical Information Specimen(s) Obtained: 1. Colon, biopsy, random 2. Colon, biopsy, sigmoid Specimen Clinical Information 1. pan colonic nodules 2. focal colitis; Pre-op: heme positive stool; epigastric pain, dysphagia; Post-op: gastric erosions, gastritis, GE junction @ 40 cm; distal esophageal triangular ulcer; colon: sigmoid colitis, sigmoid diverticulosis; pan colonic nodules which were bx'd; sigmoid erythema, edema, friability; anal papilla, hemorrhoids 1 of 2 FINAL for [place], Moxon C ([id number]) Gross 1. Received in formalin are tan, soft tissue fragments that are submitted in toto. Number: five pieces, Size: 0.3 to 1 cm (Aggregate measurement: 1.2 x 1 x 0.2 cm). 2. Received in formalin are tan, soft tissue fragments that are submitted in toto. Number: four pieces, Size: 0.2 to 0.3 (Aggregate measurement: 0.6 x 0.5 x 0.2 cm). (GRP:ecj 08/27/2013) Stain(s) used in Diagnosis: The following stain(s) were used in diagnosing the case: CD 79a, CD 5, BCl 2, CD 3, CD 43, KI-67-NO ACIS, CD-10, CD 20, CYCLIN D1. The control(s) stained appropriately. Disclaimer Some of these immunohistochemical stains may have been developed and the performance characteristics determined by [facility]. Some may not have been cleared or approved by the U.S. Food and Drug Administration. The FDA has determined that such clearance  or approval is not necessary. This test is used for clinical purposes. It should not be regarded as investigational or for research. This laboratory is certified under the Clinical Laboratory Improvement Amendments of 1988 (CLIA-88) as qualified to perform high complexity clinical laboratory testing. Report signed out from the following location(s) Technical Component performed at [facility] [address],[place], [place] [address] CLIA:  [id number]., Technical Component performed at [facility] [address], [place], [place] [address]. CLIA #: [id number], Interpretation performed at [facility] [address], [place], [place] [address]. CLIA #: [id number], Urinalysis    Component Value Date/Time   COLORURINE YELLOW 09/19/2007 1940   APPEARANCEUR CLEAR 09/19/2007 1940   LABSPEC 1.020 09/19/2007 1940   PHURINE 5.5 09/19/2007 1940   GLUCOSEU >1000* 09/19/2007 1940   HGBUR TRACE* 09/19/2007 1940   BILIRUBINUR NEGATIVE 09/19/2007 1940   KETONESUR NEGATIVE 09/19/2007 1940   PROTEINUR NEGATIVE 09/19/2007 1940   UROBILINOGEN 0.2 09/19/2007 1940   NITRITE NEGATIVE 09/19/2007 1940   LEUKOCYTESUR NEGATIVE 09/19/2007 1940    RADIOGRAPHIC STUDIES: Ct Chest W Contrast  09/06/2013   CLINICAL DATA:  New diagnosis of mantle cell lymphoma. Staging scan.  EXAM: CT CHEST, ABDOMEN, AND PELVIS WITH CONTRAST  TECHNIQUE: Multidetector CT imaging of the chest, abdomen and pelvis was performed following the standard protocol during bolus administration of intravenous contrast.  CONTRAST:  OMNIPAQUE IOHEXOL 300 MG/ML  SOLN  COMPARISON:  CT of the chest 08/25/2007. CT of the abdomen and pelvis 08/16/2008.  FINDINGS: CT CHEST FINDINGS  Mediastinum: Heart size is normal. There is no significant pericardial fluid, thickening or pericardial calcification. No pathologically enlarged mediastinal or hilar lymph nodes. Small hiatal hernia.  Lungs/Pleura: 5 mm ground-glass attenuation nodule in the lateral segment of the right middle lobe (image 40 of series 3). Other scattered peripheral 1-2 mm pulmonary nodules in the lungs bilaterally are highly nonspecific, but are most compatible with areas of very mild mucoid impaction within terminal bronchioles. No larger more suspicious appearing pulmonary nodules or masses are otherwise noted. No acute consolidative airspace disease. No pleural effusions.  Musculoskeletal:  Orthopedic fixation hardware in the lower cervical spine incompletely visualized. There are no aggressive appearing lytic or blastic lesions noted in the visualized portions of the skeleton.  CT ABDOMEN AND PELVIS FINDINGS  Abdomen/Pelvis: The appearance of the liver, gallbladder, pancreas, spleen and bilateral adrenal glands is unremarkable. Multiple low-attenuation renal lesions bilaterally are generally too small to definitively characterize. Of these renal lesions, the ones larger than 1 cm are all compatible with simple cysts, with the largest of these extending off the lower pole of the left kidney measuring 2.6 cm in diameter.  There are numerous nonenlarged retroperitoneal lymph nodes which are conspicuous in number rather than size. No definite pathologically enlarged lymph nodes are identified within the abdomen or pelvis on today's examination. Atherosclerosis throughout the abdominal and pelvic vasculature, without definite aneurysm. Numerous colonic diverticulae are noted, most pronounced in the region of the proximal sigmoid colon and distal descending colon, without surrounding inflammatory changes to suggest an acute diverticulitis at this time. No significant volume of ascites. No pneumoperitoneum. No pathologic distention of small bowel. Status post radical prostatectomy. Penile implant with reservoir in the left side of the pelvis anteriorly.  Musculoskeletal: There are no aggressive appearing lytic or blastic lesions noted in the visualized portions of the skeleton.  IMPRESSION: 1. No significant lymphadenopathy identified within the chest, abdomen or pelvis. 2. 5 mm ground-glass attenuation sub solid  nodule in the right middle lobe. Nodules of this size and appearance do not require specific imaging followup. This recommendation follows the consensus statement: Recommendations for the Management of Subsolid Pulmonary Nodules Detected at CT: A Statement from the Fleischner Society as published in  Radiology 2013; 266:304-317. 3. Multiple low-attenuation renal lesions bilaterally, the majority of which are too small to definitively characterize. These lesions are all favored to represent cysts, but would require MRI with and without IV gadolinium for definitive characterization if of clinical concern. 4. Colonic diverticulosis without findings to suggest acute diverticulitis at this time.   Electronically Signed   By: Trudie Reed M.D.   On: 09/06/2013 17:46   Ct Abdomen Pelvis W Contrast  09/06/2013   CLINICAL DATA:  New diagnosis of mantle cell lymphoma. Staging scan.  EXAM: CT CHEST, ABDOMEN, AND PELVIS WITH CONTRAST  TECHNIQUE: Multidetector CT imaging of the chest, abdomen and pelvis was performed following the standard protocol during bolus administration of intravenous contrast.  CONTRAST:  OMNIPAQUE IOHEXOL 300 MG/ML  SOLN  COMPARISON:  CT of the chest 08/25/2007. CT of the abdomen and pelvis 08/16/2008.  FINDINGS: CT CHEST FINDINGS  Mediastinum: Heart size is normal. There is no significant pericardial fluid, thickening or pericardial calcification. No pathologically enlarged mediastinal or hilar lymph nodes. Small hiatal hernia.  Lungs/Pleura: 5 mm ground-glass attenuation nodule in the lateral segment of the right middle lobe (image 40 of series 3). Other scattered peripheral 1-2 mm pulmonary nodules in the lungs bilaterally are highly nonspecific, but are most compatible with areas of very mild mucoid impaction within terminal bronchioles. No larger more suspicious appearing pulmonary nodules or masses are otherwise noted. No acute consolidative airspace disease. No pleural effusions.  Musculoskeletal: Orthopedic fixation hardware in the lower cervical spine incompletely visualized. There are no aggressive appearing lytic or blastic lesions noted in the visualized portions of the skeleton.  CT ABDOMEN AND PELVIS FINDINGS  Abdomen/Pelvis: The appearance of the liver, gallbladder,  pancreas, spleen and bilateral adrenal glands is unremarkable. Multiple low-attenuation renal lesions bilaterally are generally too small to definitively characterize. Of these renal lesions, the ones larger than 1 cm are all compatible with simple cysts, with the largest of these extending off the lower pole of the left kidney measuring 2.6 cm in diameter.  There are numerous nonenlarged retroperitoneal lymph nodes which are conspicuous in number rather than size. No definite pathologically enlarged lymph nodes are identified within the abdomen or pelvis on today's examination. Atherosclerosis throughout the abdominal and pelvic vasculature, without definite aneurysm. Numerous colonic diverticulae are noted, most pronounced in the region of the proximal sigmoid colon and distal descending colon, without surrounding inflammatory changes to suggest an acute diverticulitis at this time. No significant volume of ascites. No pneumoperitoneum. No pathologic distention of small bowel. Status post radical prostatectomy. Penile implant with reservoir in the left side of the pelvis anteriorly.  Musculoskeletal: There are no aggressive appearing lytic or blastic lesions noted in the visualized portions of the skeleton.  IMPRESSION: 1. No significant lymphadenopathy identified within the chest, abdomen or pelvis. 2. 5 mm ground-glass attenuation sub solid nodule in the right middle lobe. Nodules of this size and appearance do not require specific imaging followup. This recommendation follows the consensus statement: Recommendations for the Management of Subsolid Pulmonary Nodules Detected at CT: A Statement from the Fleischner Society as published in Radiology 2013; 266:304-317. 3. Multiple low-attenuation renal lesions bilaterally, the majority of which are too small to definitively characterize. These  lesions are all favored to represent cysts, but would require MRI with and without IV gadolinium for definitive  characterization if of clinical concern. 4. Colonic diverticulosis without findings to suggest acute diverticulitis at this time.   Electronically Signed   By: Trudie Reed M.D.   On: 09/06/2013 17:46   Ir Fluoro Guide Cv Line Right  09/13/2013   CLINICAL DATA:  Mantle cell lymphoma, access for chemotherapy  EXAM: RIGHT INTERNAL JUGULAR SINGLE LUMEN POWER PORT CATHETER INSERTION  Date:  09/13/2013 10:39 AM12/22/2014 10:39 AM  Radiologist:  Judie Petit. Ruel Favors, MD  Guidance:  Ultrasound and fluoroscopic  MEDICATIONS AND MEDICAL HISTORY: 2 g Ancefadministered within 1 hour of the procedure.2.5 mg Versed, 125 mcg fentanyl  ANESTHESIA/SEDATION: 35 min  CONTRAST:  None.  FLUOROSCOPY TIME:  24 seconds  PROCEDURE: Informed consent was obtained from the patient following explanation of the procedure, risks, benefits and alternatives. The patient understands, agrees and consents for the procedure. All questions were addressed. A time out was performed.  Maximal barrier sterile technique utilized including caps, mask, sterile gowns, sterile gloves, large sterile drape, hand hygiene, and 2% chlorhexidine scrub.  Under sterile conditions and local anesthesia, right internal jugular micropuncture venous access was performed. Access was performed with ultrasound. Images were obtained for documentation. A guide wire was inserted followed by a transitional dilator. This allowed insertion of a guide wire and catheter into the IVC. Measurements were obtained from the SVC / RA junction back to the right IJ venotomy site. In the right infraclavicular chest, a subcutaneous pocket was created over the second anterior rib. This was done under sterile conditions and local anesthesia. 1% lidocaine with epinephrine was utilized for this. A 2.5 cm incision was made in the skin. Blunt dissection was performed to create a subcutaneous pocket over the right pectoralis major muscle. The pocket was flushed with saline vigorously. There was  adequate hemostasis. The port catheter was assembled and checked for leakage. The port catheter was secured in the pocket with two retention sutures. The tubing was tunneled subcutaneously to the right venotomy site and inserted into the SVC/RA junction through a valved peel-away sheath. Position was confirmed with fluoroscopy. Images were obtained for documentation. The patient tolerated the procedure well. No immediate complications. Incisions were closed in a two layer fashion with 4 - 0 Vicryl suture. Dermabond was applied to the skin. The port catheter was accessed, blood was aspirated followed by saline and heparin flushes. Needle was removed. A dry sterile dressing was applied.  COMPLICATIONS: No immediate  IMPRESSION: Ultrasound and fluoroscopically guided right internal jugular single lumen power port catheter insertion. Tip in the SVC/RA junction. Catheter ready for use.   Electronically Signed   By: Ruel Favors M.D.   On: 09/13/2013 10:56   Ir US Guide Vasc Access Right  09/13/2013   CLINICAL DATA:  Mantle cell lymphoma, access for chemotherapy  EXAM: RIGHT INTERNAL JUGULAR SINGLE LUMEN POWER PORT CATHETER INSERTION  Date:  09/13/2013 10:39 AM12/22/2014 10:39 AM  Radiologist:  Judie Petit. Ruel Favors, MD  Guidance:  Ultrasound and fluoroscopic  MEDICATIONS AND MEDICAL HISTORY: 2 g Ancefadministered within 1 hour of the procedure.2.5 mg Versed, 125 mcg fentanyl  ANESTHESIA/SEDATION: 35 min  CONTRAST:  None.  FLUOROSCOPY TIME:  24 seconds  PROCEDURE: Informed consent was obtained from the patient following explanation of the procedure, risks, benefits and alternatives. The patient understands, agrees and consents for the procedure. All questions were addressed. A time out was performed.  Maximal barrier  sterile technique utilized including caps, mask, sterile gowns, sterile gloves, large sterile drape, hand hygiene, and 2% chlorhexidine scrub.  Under sterile conditions and local anesthesia, right internal  jugular micropuncture venous access was performed. Access was performed with ultrasound. Images were obtained for documentation. A guide wire was inserted followed by a transitional dilator. This allowed insertion of a guide wire and catheter into the IVC. Measurements were obtained from the SVC / RA junction back to the right IJ venotomy site. In the right infraclavicular chest, a subcutaneous pocket was created over the second anterior rib. This was done under sterile conditions and local anesthesia. 1% lidocaine with epinephrine was utilized for this. A 2.5 cm incision was made in the skin. Blunt dissection was performed to create a subcutaneous pocket over the right pectoralis major muscle. The pocket was flushed with saline vigorously. There was adequate hemostasis. The port catheter was assembled and checked for leakage. The port catheter was secured in the pocket with two retention sutures. The tubing was tunneled subcutaneously to the right venotomy site and inserted into the SVC/RA junction through a valved peel-away sheath. Position was confirmed with fluoroscopy. Images were obtained for documentation. The patient tolerated the procedure well. No immediate complications. Incisions were closed in a two layer fashion with 4 - 0 Vicryl suture. Dermabond was applied to the skin. The port catheter was accessed, blood was aspirated followed by saline and heparin flushes. Needle was removed. A dry sterile dressing was applied.  COMPLICATIONS: No immediate  IMPRESSION: Ultrasound and fluoroscopically guided right internal jugular single lumen power port catheter insertion. Tip in the SVC/RA junction. Catheter ready for use.   Electronically Signed   By: Ruel Favors M.D.   On: 09/13/2013 10:56    ASSESSMENT:  #1. Stage I B. mantle cell lymphoma involving the colon, fair tolerance of initial chemotherapy. #2. Ulcerative colitis, active. #3. Depression and anxiety disorder. #4. Past history of prostate  cancer. #5. Leukocytosis secondary to Neulasta.   PLAN:  #1. Lexapro 10 mg daily. #2. Patient was instructed to telephone his gastroenterologist for initiation of some active treatment for his colitis. #3. No specific gravity for his itchy eyes. He is using artificial tears told him to continue using them more often. #4. Followup on 10/11/2013 to begin cycle #2 of therapy.   All questions were answered. The patient knows to call the clinic with any problems, questions or concerns. We can certainly see the patient much sooner if necessary.   I spent 25 minutes counseling the patient face to face. The total time spent in the appointment was 30 minutes.    Maurilio Lovely, MD 09/21/2013 11:28 AM

## 2013-09-21 NOTE — Progress Notes (Signed)
Labs drawn today for cbc/diff 

## 2013-09-21 NOTE — Patient Instructions (Signed)
[facility] Discharge Instructions  RECOMMENDATIONS MADE BY THE CONSULTANT AND ANY TEST RESULTS WILL BE SENT TO YOUR REFERRING PHYSICIAN.  EXAM FINDINGS BY THE PHYSICIAN TODAY AND SIGNS OR SYMPTOMS TO REPORT TO CLINIC OR PRIMARY PHYSICIAN: Exam and findings as discussed by Dr. Zigmund Daniel.  Report fevers, chills, uncontrolled nausea or vomiting, etc.  Talk with Dr.  Karilyn Cota about your colitis to see if he wants to put you on something.  MEDICATIONS PRESCRIBED:  Lexapro - take as directed  INSTRUCTIONS/FOLLOW-UP: Follow-up as scheduled.  Thank you for choosing [facility] to provide your oncology and hematology care.  To afford each patient quality time with our providers, please arrive at least 15 minutes before your scheduled appointment time.  With your help, our goal is to use those 15 minutes to complete the necessary work-up to ensure our physicians have the information they need to help with your evaluation and healthcare recommendations.    Effective January 1st, 2014, we ask that you re-schedule your appointment with our physicians should you arrive 10 or more minutes late for your appointment.  We strive to give you quality time with our providers, and arriving late affects you and other patients whose appointments are after yours.    Again, thank you for choosing [facility].  Our hope is that these requests will decrease the amount of time that you wait before being seen by our physicians.       _____________________________________________________________  Should you have questions after your visit to [facility], please contact our office at [phone] between the hours of 8:30 a.m. and 5:00 p.m.  Voicemails left after 4:30 p.m. will not be returned until the following business day.  For prescription refill requests, have your pharmacy contact our office with your prescription refill request.

## 2013-09-27 DIAGNOSIS — G250 Essential tremor: Secondary | ICD-10-CM | POA: Diagnosis not present

## 2013-09-27 DIAGNOSIS — I10 Essential (primary) hypertension: Secondary | ICD-10-CM | POA: Diagnosis not present

## 2013-09-27 DIAGNOSIS — D696 Thrombocytopenia, unspecified: Secondary | ICD-10-CM | POA: Diagnosis not present

## 2013-09-27 DIAGNOSIS — E119 Type 2 diabetes mellitus without complications: Secondary | ICD-10-CM | POA: Diagnosis not present

## 2013-10-11 ENCOUNTER — Encounter (HOSPITAL_COMMUNITY): Payer: Self-pay

## 2013-10-11 ENCOUNTER — Encounter (HOSPITAL_COMMUNITY): Payer: Medicare Other | Attending: Hematology and Oncology

## 2013-10-11 ENCOUNTER — Other Ambulatory Visit (HOSPITAL_COMMUNITY): Payer: Self-pay | Admitting: Hematology and Oncology

## 2013-10-11 ENCOUNTER — Encounter (HOSPITAL_BASED_OUTPATIENT_CLINIC_OR_DEPARTMENT_OTHER): Payer: Medicare Other

## 2013-10-11 VITALS — BP 138/78 | HR 67 | Temp 97.5°F | Resp 18 | Wt 222.0 lb

## 2013-10-11 VITALS — BP 127/71 | HR 72

## 2013-10-11 DIAGNOSIS — G4733 Obstructive sleep apnea (adult) (pediatric): Secondary | ICD-10-CM | POA: Diagnosis not present

## 2013-10-11 DIAGNOSIS — Z5112 Encounter for antineoplastic immunotherapy: Secondary | ICD-10-CM

## 2013-10-11 DIAGNOSIS — C8319 Mantle cell lymphoma, extranodal and solid organ sites: Secondary | ICD-10-CM | POA: Diagnosis not present

## 2013-10-11 DIAGNOSIS — Z8546 Personal history of malignant neoplasm of prostate: Secondary | ICD-10-CM

## 2013-10-11 DIAGNOSIS — Z8719 Personal history of other diseases of the digestive system: Secondary | ICD-10-CM

## 2013-10-11 DIAGNOSIS — K6289 Other specified diseases of anus and rectum: Secondary | ICD-10-CM | POA: Insufficient documentation

## 2013-10-11 DIAGNOSIS — Z5111 Encounter for antineoplastic chemotherapy: Secondary | ICD-10-CM

## 2013-10-11 DIAGNOSIS — K5190 Ulcerative colitis, unspecified, without complications: Secondary | ICD-10-CM | POA: Diagnosis not present

## 2013-10-11 DIAGNOSIS — C8313 Mantle cell lymphoma, intra-abdominal lymph nodes: Secondary | ICD-10-CM

## 2013-10-11 DIAGNOSIS — C61 Malignant neoplasm of prostate: Secondary | ICD-10-CM | POA: Diagnosis not present

## 2013-10-11 DIAGNOSIS — I2510 Atherosclerotic heart disease of native coronary artery without angina pectoris: Secondary | ICD-10-CM

## 2013-10-11 DIAGNOSIS — K627 Radiation proctitis: Secondary | ICD-10-CM

## 2013-10-11 DIAGNOSIS — C8310 Mantle cell lymphoma, unspecified site: Secondary | ICD-10-CM

## 2013-10-11 DIAGNOSIS — I10 Essential (primary) hypertension: Secondary | ICD-10-CM

## 2013-10-11 DIAGNOSIS — D6959 Other secondary thrombocytopenia: Secondary | ICD-10-CM | POA: Diagnosis not present

## 2013-10-11 LAB — CBC WITH DIFFERENTIAL/PLATELET
Basophils Absolute: 0 10*3/uL (ref 0.0–0.1)
Basophils Relative: 1 % (ref 0–1)
EOS ABS: 0.1 10*3/uL (ref 0.0–0.7)
Eosinophils Relative: 3 % (ref 0–5)
HCT: 36.2 % — ABNORMAL LOW (ref 39.0–52.0)
Hemoglobin: 13 g/dL (ref 13.0–17.0)
LYMPHS ABS: 0.3 10*3/uL — AB (ref 0.7–4.0)
Lymphocytes Relative: 9 % — ABNORMAL LOW (ref 12–46)
MCH: 31 pg (ref 26.0–34.0)
MCHC: 35.9 g/dL (ref 30.0–36.0)
MCV: 86.4 fL (ref 78.0–100.0)
MONOS PCT: 16 % — AB (ref 3–12)
Monocytes Absolute: 0.6 10*3/uL (ref 0.1–1.0)
NEUTROS ABS: 2.6 10*3/uL (ref 1.7–7.7)
NEUTROS PCT: 71 % (ref 43–77)
PLATELETS: 93 10*3/uL — AB (ref 150–400)
RBC: 4.19 MIL/uL — AB (ref 4.22–5.81)
RDW: 14.3 % (ref 11.5–15.5)
WBC: 3.7 10*3/uL — ABNORMAL LOW (ref 4.0–10.5)

## 2013-10-11 LAB — COMPREHENSIVE METABOLIC PANEL
ALK PHOS: 46 U/L (ref 39–117)
ALT: 15 U/L (ref 0–53)
AST: 17 U/L (ref 0–37)
Albumin: 3.8 g/dL (ref 3.5–5.2)
BILIRUBIN TOTAL: 0.8 mg/dL (ref 0.3–1.2)
BUN: 27 mg/dL — AB (ref 6–23)
CHLORIDE: 102 meq/L (ref 96–112)
CO2: 27 mEq/L (ref 19–32)
Calcium: 8.8 mg/dL (ref 8.4–10.5)
Creatinine, Ser: 1.14 mg/dL (ref 0.50–1.35)
GFR calc non Af Amer: 61 mL/min — ABNORMAL LOW (ref >90)
GFR, EST AFRICAN AMERICAN: 70 mL/min — AB (ref >90)
Glucose, Bld: 106 mg/dL — ABNORMAL HIGH (ref 70–99)
POTASSIUM: 4 meq/L (ref 3.7–5.3)
SODIUM: 142 meq/L (ref 137–147)
TOTAL PROTEIN: 6.7 g/dL (ref 6.0–8.3)

## 2013-10-11 LAB — LACTATE DEHYDROGENASE: LDH: 165 U/L (ref 94–250)

## 2013-10-11 MED ORDER — SODIUM CHLORIDE 0.9 % IJ SOLN: 10.0000 mL | INTRAMUSCULAR | Status: DC | PRN

## 2013-10-11 MED ORDER — HEPARIN SOD (PORK) LOCK FLUSH 100 UNIT/ML IV SOLN
500.0000 [IU] | Freq: Once | INTRAVENOUS | Status: AC | PRN
Start: 2013-10-11 — End: 2013-10-11
  Administered 2013-10-11: 500 [IU]
  Filled 2013-10-11 (×2): qty 5

## 2013-10-11 MED ORDER — SODIUM CHLORIDE 0.9 % IV SOLN: 8.0000 mg | Freq: Once | INTRAVENOUS | Status: DC

## 2013-10-11 MED ORDER — ONDANSETRON HCL 40 MG/20ML IJ SOLN
Freq: Once | INTRAMUSCULAR | Status: AC
  Administered 2013-10-11: 8 mg via INTRAVENOUS
  Filled 2013-10-11: qty 4

## 2013-10-11 MED ORDER — CYANOCOBALAMIN 1000 MCG/ML IJ SOLN
1000.0000 ug | INTRAMUSCULAR | Status: DC
  Administered 2013-10-11: 1000 ug via INTRAMUSCULAR
  Filled 2013-10-11: qty 1

## 2013-10-11 MED ORDER — SODIUM CHLORIDE 0.9 % IV SOLN
90.0000 mg/m2 | Freq: Once | INTRAVENOUS | Status: AC
  Administered 2013-10-11: 205 mg via INTRAVENOUS
  Filled 2013-10-11: qty 0.5

## 2013-10-11 MED ORDER — DEXAMETHASONE SODIUM PHOSPHATE 10 MG/ML IJ SOLN
10.0000 mg | Freq: Once | INTRAMUSCULAR | Status: DC
Start: 2013-10-11 — End: 2013-10-11

## 2013-10-11 MED ORDER — SODIUM CHLORIDE 0.9 % IV SOLN
Freq: Once | INTRAVENOUS | Status: AC
  Administered 2013-10-11: 11:00:00 via INTRAVENOUS

## 2013-10-11 MED ORDER — RITUXIMAB CHEMO INJECTION 10 MG/ML
375.0000 mg/m2 | Freq: Once | INTRAVENOUS | Status: AC
  Administered 2013-10-11: 900 mg via INTRAVENOUS
  Filled 2013-10-11: qty 80

## 2013-10-11 NOTE — Patient Instructions (Signed)
[facility] Discharge Instructions  RECOMMENDATIONS MADE BY THE CONSULTANT AND ANY TEST RESULTS WILL BE SENT TO YOUR REFERRING PHYSICIAN.  EXAM FINDINGS BY THE PHYSICIAN TODAY AND SIGNS OR SYMPTOMS TO REPORT TO CLINIC OR PRIMARY PHYSICIAN: Exam and findings as discussed by Dr. Barnet Glasgow.  Will stop the compazine as it may be causing increased hand tremors.  Report uncontrolled nausea, vomiting or other problems.  MEDICATIONS PRESCRIBED:  none  INSTRUCTIONS/FOLLOW-UP: Follow-up in 3 weeks with chemotherapy and office visit.  Thank you for choosing [facility] to provide your oncology and hematology care.  To afford each patient quality time with our providers, please arrive at least 15 minutes before your scheduled appointment time.  With your help, our goal is to use those 15 minutes to complete the necessary work-up to ensure our physicians have the information they need to help with your evaluation and healthcare recommendations.    Effective January 1st, 2014, we ask that you re-schedule your appointment with our physicians should you arrive 10 or more minutes late for your appointment.  We strive to give you quality time with our providers, and arriving late affects you and other patients whose appointments are after yours.    Again, thank you for choosing [facility].  Our hope is that these requests will decrease the amount of time that you wait before being seen by our physicians.       _____________________________________________________________  Should you have questions after your visit to [facility], please contact our office at [phone] between the hours of 8:30 a.m. and 5:00 p.m.  Voicemails left after 4:30 p.m. will not be returned until the following business day.  For prescription refill requests, have your pharmacy contact our office with your prescription refill request.

## 2013-10-11 NOTE — Progress Notes (Signed)
[facility]  OFFICE PROGRESS NOTE  [name], MD  [place] [address]  DIAGNOSIS: Mantle cell lymphoma  ulcerative colitis  Chief Complaint  Patient presents with  . Lymphoma    Mantle cell foam involving the colon for cycle 2 of chemotherapy    CURRENT THERAPY: Bendamustine/Rituxan, status post 1 cycle  INTERVAL HISTORY: [name] 79 y.o. male returns for continuation of chemotherapy for mantle cell lymphoma planning to receive cycle 2 of bendamustine/Rituxan with Neulasta support. Concurrent ulcerative colitis with intermittent symptomatology including rectal bleeding. He received treatment from his gastroenterologist and no longer has any rectal bleeding or abdominal discomfort. He does feel tremulous after taking Compazine. He denies any fever, night sweats, nausea, vomiting, sore throat, skin rash, joint pain, headache, or seizures. He does experience lower extremity swelling when he stands all day. The swelling dissipates overnight.  MEDICAL HISTORY: Past Medical History  Diagnosis Date  . Type 2 diabetes mellitus   . 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 Mixed hyperlipidemia   . PVD (peripheral vascular disease)   . Essential hypertension, benign   . Prostate cancer   . Obstructive sleep apnea     On CPAP    INTERIM HISTORY: has Mixed hyperlipidemia; HYPERTENSION, BENIGN; Coronary atherosclerosis of native coronary artery; VASOMOTOR RHINITIS; GERD; OSA (obstructive sleep apnea); H/O ulcerative colitis; Mantle cell lymphoma; Prostate cancer; and Proctitis, radiation on his problem list.    ALLERGIES:  is allergic to bee venom; adhesive; ciprofloxacin; codeine; iodine; latex; povidone; and simvastatin.  MEDICATIONS: has a current medication list which includes the following prescription(s): acetaminophen,  amlodipine, atorvastatin, cyanocobalamin, diazepam, diphenhydramine, escitalopram, insulin glargine, lidocaine-prilocaine, lisinopril, metformin, metoclopramide, pantoprazole, prochlorperazine, docusate calcium, and oxycodone.  SURGICAL HISTORY:  Past Surgical History  Procedure Laterality Date  . Tonsillectomy    . Back surgery    . Vasectomy    . Knee surgery      Right  . Shoulder surgery    . Prostatectomy    . Colonoscopy with esophagogastroduodenoscopy (egd) N/A 08/27/2013    Procedure: COLONOSCOPY WITH ESOPHAGOGASTRODUODENOSCOPY (EGD);  Surgeon: Rogene Houston, MD;  Location: [facility];  Service: Endoscopy;  Laterality: N/A;  730  . Balloon dilation N/A 08/27/2013    Procedure: BALLOON DILATION;  Surgeon: Rogene Houston, MD;  Location: [facility];  Service: Endoscopy;  Laterality: N/A;  Venia Minks dilation N/A 08/27/2013    Procedure: Venia Minks DILATION;  Surgeon: Rogene Houston, MD;  Location: [facility];  Service: Endoscopy;  Laterality: N/A;  . Savory dilation N/A 08/27/2013    Procedure: SAVORY DILATION;  Surgeon: Rogene Houston, MD;  Location: [facility];  Service: Endoscopy;  Laterality: N/A;  . Portacath placement Right 2014    FAMILY HISTORY: family history includes Cancer in his brother and brother; Colon cancer in his brother; Diabetes in his father.  SOCIAL HISTORY:  reports that he has never smoked. He has never used smokeless tobacco. He reports that he does not drink alcohol or use illicit drugs.  REVIEW OF SYSTEMS:  Other than that discussed above is noncontributory.  PHYSICAL EXAMINATION: ECOG PERFORMANCE STATUS: 1 - Symptomatic but completely ambulatory  Blood pressure 138/78, pulse 67, temperature 97.5 F (36.4 C), temperature source Oral, resp. rate 18, weight 222 lb (100.699 kg).  GENERAL:alert,  no distress and comfortable. SKIN: skin color, texture, turgor are normal, no rashes or significant lesions EYES: PERLA; Conjunctiva are pink and  non-injected, sclera clear OROPHARYNX:no exudate, no erythema on lips, buccal mucosa, or tongue. NECK: supple, thyroid normal size, non-tender, without nodularity. No masses CHEST: Normal AP diameter with light port in place in the right anterior chest. LYMPH:  no palpable lymphadenopathy in the cervical, axillary or inguinal LUNGS: clear to auscultation and percussion with normal breathing effort HEART: regular rate & rhythm and no murmurs. ABDOMEN:abdomen soft, non-tender and normal bowel sounds MUSCULOSKELETAL:no cyanosis of digits and no clubbing. Range of motion normal.  NEURO: alert & oriented x 3 with fluent speech, no focal motor/sensory deficits   LABORATORY DATA: Infusion on 10/11/2013  Component Date Value Range Status  . LDH 10/11/2013 165  94 - 250 U/L Final  . WBC 10/11/2013 3.7* 4.0 - 10.5 K/uL Final  . RBC 10/11/2013 4.19* 4.22 - 5.81 MIL/uL Final  . Hemoglobin 10/11/2013 13.0  13.0 - 17.0 g/dL Final  . HCT 10/11/2013 36.2* 39.0 - 52.0 % Final  . MCV 10/11/2013 86.4  78.0 - 100.0 fL Final  . MCH 10/11/2013 31.0  26.0 - 34.0 pg Final  . MCHC 10/11/2013 35.9  30.0 - 36.0 g/dL Final  . RDW 10/11/2013 14.3  11.5 - 15.5 % Final  . Platelets 10/11/2013 93* 150 - 400 K/uL Final   Comment: SPECIMEN CHECKED FOR CLOTS                          CONSISTENT WITH PREVIOUS RESULT  . Neutrophils Relative % 10/11/2013 71  43 - 77 % Final  . Neutro Abs 10/11/2013 2.6  1.7 - 7.7 K/uL Final  . Lymphocytes Relative 10/11/2013 9* 12 - 46 % Final  . Lymphs Abs 10/11/2013 0.3* 0.7 - 4.0 K/uL Final  . Monocytes Relative 10/11/2013 16* 3 - 12 % Final  . Monocytes Absolute 10/11/2013 0.6  0.1 - 1.0 K/uL Final  . Eosinophils Relative 10/11/2013 3  0 - 5 % Final  . Eosinophils Absolute 10/11/2013 0.1  0.0 - 0.7 K/uL Final  . Basophils Relative 10/11/2013 1  0 - 1 % Final  . Basophils Absolute 10/11/2013 0.0  0.0 - 0.1 K/uL Final  . Sodium 10/11/2013 142  137 - 147 mEq/L Final  . Potassium  10/11/2013 4.0  3.7 - 5.3 mEq/L Final  . Chloride 10/11/2013 102  96 - 112 mEq/L Final  . CO2 10/11/2013 27  19 - 32 mEq/L Final  . Glucose, Bld 10/11/2013 106* 70 - 99 mg/dL Final  . BUN 10/11/2013 27* 6 - 23 mg/dL Final  . Creatinine, Ser 10/11/2013 1.14  0.50 - 1.35 mg/dL Final  . Calcium 10/11/2013 8.8  8.4 - 10.5 mg/dL Final  . Total Protein 10/11/2013 6.7  6.0 - 8.3 g/dL Final  . Albumin 10/11/2013 3.8  3.5 - 5.2 g/dL Final  . AST 10/11/2013 17  0 - 37 U/L Final  . ALT 10/11/2013 15  0 - 53 U/L Final  . Alkaline Phosphatase 10/11/2013 46  39 - 117 U/L Final  . Total Bilirubin 10/11/2013 0.8  0.3 - 1.2 mg/dL Final  . GFR calc non Af Amer 10/11/2013 61* >90 mL/min Final  . GFR calc Af Amer 10/11/2013 70* >90 mL/min Final   Comment: (NOTE)  The eGFR has been calculated using the CKD EPI equation.                          This calculation has not been validated in all clinical situations.                          eGFR's persistently <90 mL/min signify possible Chronic Kidney                          Disease.  Office Visit on 09/21/2013  Component Date Value Range Status  . WBC 09/21/2013 20.0* 4.0 - 10.5 K/uL Final  . RBC 09/21/2013 5.12  4.22 - 5.81 MIL/uL Final  . Hemoglobin 09/21/2013 15.6  13.0 - 17.0 g/dL Final  . HCT 09/21/2013 46.0  39.0 - 52.0 % Final  . MCV 09/21/2013 89.8  78.0 - 100.0 fL Final  . MCH 09/21/2013 30.5  26.0 - 34.0 pg Final  . MCHC 09/21/2013 33.9  30.0 - 36.0 g/dL Final  . RDW 09/21/2013 14.5  11.5 - 15.5 % Final  . Platelets 09/21/2013 130* 150 - 400 K/uL Final  . Neutrophils Relative % 09/21/2013 91* 43 - 77 % Final  . Neutro Abs 09/21/2013 18.3* 1.7 - 7.7 K/uL Final  . Lymphocytes Relative 09/21/2013 1* 12 - 46 % Final  . Lymphs Abs 09/21/2013 0.3* 0.7 - 4.0 K/uL Final  . Monocytes Relative 09/21/2013 6  3 - 12 % Final  . Monocytes Absolute 09/21/2013 1.2* 0.1 - 1.0 K/uL Final  . Eosinophils Relative 09/21/2013 1  0 - 5 %  Final  . Eosinophils Absolute 09/21/2013 0.2  0.0 - 0.7 K/uL Final  . Basophils Relative 09/21/2013 0  0 - 1 % Final  . Basophils Absolute 09/21/2013 0.1  0.0 - 0.1 K/uL Final  . WBC Morphology 09/21/2013 MILD LEFT SHIFT (1-5% METAS, OCC MYELO, OCC BANDS)   Final  Infusion on 09/14/2013  Component Date Value Range Status  . WBC 09/14/2013 7.0  4.0 - 10.5 K/uL Final  . RBC 09/14/2013 5.02  4.22 - 5.81 MIL/uL Final  . Hemoglobin 09/14/2013 15.3  13.0 - 17.0 g/dL Final  . HCT 09/14/2013 44.6  39.0 - 52.0 % Final  . MCV 09/14/2013 88.8  78.0 - 100.0 fL Final  . MCH 09/14/2013 30.5  26.0 - 34.0 pg Final  . MCHC 09/14/2013 34.3  30.0 - 36.0 g/dL Final  . RDW 09/14/2013 14.1  11.5 - 15.5 % Final  . Platelets 09/14/2013 124* 150 - 400 K/uL Final  . Neutrophils Relative % 09/14/2013 65  43 - 77 % Final  . Neutro Abs 09/14/2013 4.5  1.7 - 7.7 K/uL Final  . Lymphocytes Relative 09/14/2013 27  12 - 46 % Final  . Lymphs Abs 09/14/2013 1.9  0.7 - 4.0 K/uL Final  . Monocytes Relative 09/14/2013 6  3 - 12 % Final  . Monocytes Absolute 09/14/2013 0.4  0.1 - 1.0 K/uL Final  . Eosinophils Relative 09/14/2013 1  0 - 5 % Final  . Eosinophils Absolute 09/14/2013 0.1  0.0 - 0.7 K/uL Final  . Basophils Relative 09/14/2013 0  0 - 1 % Final  . Basophils Absolute 09/14/2013 0.0  0.0 - 0.1 K/uL Final  . Retic Ct Pct 09/14/2013 0.7  0.4 - 3.1 % Final  . RBC. 09/14/2013 5.02  4.22 - 5.81 MIL/uL Final  .  Retic Count, Manual 09/14/2013 35.1  19.0 - 186.0 K/uL Final  . Sodium 09/14/2013 138  135 - 145 mEq/L Final  . Potassium 09/14/2013 4.2  3.5 - 5.1 mEq/L Final  . Chloride 09/14/2013 98  96 - 112 mEq/L Final  . CO2 09/14/2013 28  19 - 32 mEq/L Final  . Glucose, Bld 09/14/2013 137* 70 - 99 mg/dL Final  . BUN 09/14/2013 27* 6 - 23 mg/dL Final  . Creatinine, Ser 09/14/2013 1.20  0.50 - 1.35 mg/dL Final  . Calcium 09/14/2013 9.6  8.4 - 10.5 mg/dL Final  . Total Protein 09/14/2013 7.2  6.0 - 8.3 g/dL Final  .  Albumin 09/14/2013 3.9  3.5 - 5.2 g/dL Final  . AST 09/14/2013 17  0 - 37 U/L Final  . ALT 09/14/2013 14  0 - 53 U/L Final  . Alkaline Phosphatase 09/14/2013 55  39 - 117 U/L Final  . Total Bilirubin 09/14/2013 0.7  0.3 - 1.2 mg/dL Final  . GFR calc non Af Amer 09/14/2013 57* >90 mL/min Final  . GFR calc Af Amer 09/14/2013 66* >90 mL/min Final   Comment: (NOTE)                          The eGFR has been calculated using the CKD EPI equation.                          This calculation has not been validated in all clinical situations.                          eGFR's persistently <90 mL/min signify possible Chronic Kidney                          Disease.  . Sed Rate 09/14/2013 2  0 - 16 mm/hr Final  Hospital Outpatient Visit on 09/13/2013  Component Date Value Range Status  . Glucose-Capillary 09/13/2013 143* 70 - 99 mg/dL Final  . Comment 1 09/13/2013 Documented in Chart   Final  Hospital Outpatient Visit on 09/13/2013  Component Date Value Range Status  . aPTT 09/13/2013 34  24 - 37 seconds Final  . WBC 09/13/2013 7.7  4.0 - 10.5 K/uL Final  . RBC 09/13/2013 4.98  4.22 - 5.81 MIL/uL Final  . Hemoglobin 09/13/2013 15.2  13.0 - 17.0 g/dL Final  . HCT 09/13/2013 44.1  39.0 - 52.0 % Final  . MCV 09/13/2013 88.6  78.0 - 100.0 fL Final  . MCH 09/13/2013 30.5  26.0 - 34.0 pg Final  . MCHC 09/13/2013 34.5  30.0 - 36.0 g/dL Final  . RDW 09/13/2013 14.1  11.5 - 15.5 % Final  . Platelets 09/13/2013 129* 150 - 400 K/uL Final   Comment: SPECIMEN CHECKED FOR CLOTS                          REPEATED TO VERIFY  . Prothrombin Time 09/13/2013 12.8  11.6 - 15.2 seconds Final  . INR 09/13/2013 0.98  0.00 - 1.49 Final    PATHOLOGY: No new pathology.  Urinalysis    Component Value Date/Time   COLORURINE YELLOW 09/19/2007 1940   APPEARANCEUR CLEAR 09/19/2007 1940   LABSPEC 1.020 09/19/2007 1940   PHURINE 5.5 09/19/2007 1940   GLUCOSEU >1000* 09/19/2007 1940   HGBUR TRACE* 09/19/2007 1940  BILIRUBINUR NEGATIVE 09/19/2007 1940   KETONESUR NEGATIVE 09/19/2007 1940   PROTEINUR NEGATIVE 09/19/2007 1940   UROBILINOGEN 0.2 09/19/2007 1940   NITRITE NEGATIVE 09/19/2007 1940   LEUKOCYTESUR NEGATIVE 09/19/2007 1940    RADIOGRAPHIC STUDIES: Ir Fluoro Guide Cv Line Right  09/13/2013   CLINICAL DATA:  Mantle cell lymphoma, access for chemotherapy  EXAM: RIGHT INTERNAL JUGULAR SINGLE LUMEN POWER PORT CATHETER INSERTION  Date:  09/13/2013 10:39 AM12/22/2014 10:39 AM  Radiologist:  Jerilynn Mages. Daryll Brod, MD  Guidance:  Ultrasound and fluoroscopic  MEDICATIONS AND MEDICAL HISTORY: 2 g Ancefadministered within 1 hour of the procedure.2.5 mg Versed, 125 mcg fentanyl  ANESTHESIA/SEDATION: 35 min  CONTRAST:  None.  FLUOROSCOPY TIME:  24 seconds  PROCEDURE: Informed consent was obtained from the patient following explanation of the procedure, risks, benefits and alternatives. The patient understands, agrees and consents for the procedure. All questions were addressed. A time out was performed.  Maximal barrier sterile technique utilized including caps, mask, sterile gowns, sterile gloves, large sterile drape, hand hygiene, and 2% chlorhexidine scrub.  Under sterile conditions and local anesthesia, right internal jugular micropuncture venous access was performed. Access was performed with ultrasound. Images were obtained for documentation. A guide wire was inserted followed by a transitional dilator. This allowed insertion of a guide wire and catheter into the IVC. Measurements were obtained from the SVC / RA junction back to the right IJ venotomy site. In the right infraclavicular chest, a subcutaneous pocket was created over the second anterior rib. This was done under sterile conditions and local anesthesia. 1% lidocaine with epinephrine was utilized for this. A 2.5 cm incision was made in the skin. Blunt dissection was performed to create a subcutaneous pocket over the right pectoralis major muscle. The pocket was  flushed with saline vigorously. There was adequate hemostasis. The port catheter was assembled and checked for leakage. The port catheter was secured in the pocket with two retention sutures. The tubing was tunneled subcutaneously to the right venotomy site and inserted into the SVC/RA junction through a valved peel-away sheath. Position was confirmed with fluoroscopy. Images were obtained for documentation. The patient tolerated the procedure well. No immediate complications. Incisions were closed in a two layer fashion with 4 - 0 Vicryl suture. Dermabond was applied to the skin. The port catheter was accessed, blood was aspirated followed by saline and heparin flushes. Needle was removed. A dry sterile dressing was applied.  COMPLICATIONS: No immediate  IMPRESSION: Ultrasound and fluoroscopically guided right internal jugular single lumen power port catheter insertion. Tip in the SVC/RA junction. Catheter ready for use.   Electronically Signed   By: Daryll Brod M.D.   On: 09/13/2013 10:56   Ir US Guide Vasc Access Right  09/13/2013   CLINICAL DATA:  Mantle cell lymphoma, access for chemotherapy  EXAM: RIGHT INTERNAL JUGULAR SINGLE LUMEN POWER PORT CATHETER INSERTION  Date:  09/13/2013 10:39 AM12/22/2014 10:39 AM  Radiologist:  Jerilynn Mages. Daryll Brod, MD  Guidance:  Ultrasound and fluoroscopic  MEDICATIONS AND MEDICAL HISTORY: 2 g Ancefadministered within 1 hour of the procedure.2.5 mg Versed, 125 mcg fentanyl  ANESTHESIA/SEDATION: 35 min  CONTRAST:  None.  FLUOROSCOPY TIME:  24 seconds  PROCEDURE: Informed consent was obtained from the patient following explanation of the procedure, risks, benefits and alternatives. The patient understands, agrees and consents for the procedure. All questions were addressed. A time out was performed.  Maximal barrier sterile technique utilized including caps, mask, sterile gowns, sterile gloves, large sterile drape, hand hygiene,  and 2% chlorhexidine scrub.  Under sterile  conditions and local anesthesia, right internal jugular micropuncture venous access was performed. Access was performed with ultrasound. Images were obtained for documentation. A guide wire was inserted followed by a transitional dilator. This allowed insertion of a guide wire and catheter into the IVC. Measurements were obtained from the SVC / RA junction back to the right IJ venotomy site. In the right infraclavicular chest, a subcutaneous pocket was created over the second anterior rib. This was done under sterile conditions and local anesthesia. 1% lidocaine with epinephrine was utilized for this. A 2.5 cm incision was made in the skin. Blunt dissection was performed to create a subcutaneous pocket over the right pectoralis major muscle. The pocket was flushed with saline vigorously. There was adequate hemostasis. The port catheter was assembled and checked for leakage. The port catheter was secured in the pocket with two retention sutures. The tubing was tunneled subcutaneously to the right venotomy site and inserted into the SVC/RA junction through a valved peel-away sheath. Position was confirmed with fluoroscopy. Images were obtained for documentation. The patient tolerated the procedure well. No immediate complications. Incisions were closed in a two layer fashion with 4 - 0 Vicryl suture. Dermabond was applied to the skin. The port catheter was accessed, blood was aspirated followed by saline and heparin flushes. Needle was removed. A dry sterile dressing was applied.  COMPLICATIONS: No immediate  IMPRESSION: Ultrasound and fluoroscopically guided right internal jugular single lumen power port catheter insertion. Tip in the SVC/RA junction. Catheter ready for use.   Electronically Signed   By: Daryll Brod M.D.   On: 09/13/2013 10:56    ASSESSMENT:  #1. Stage I-E. mantle cell lymphoma involving the colon, for cycle 2 of chemotherapy today with bendamustine and Rituxan followed by Neulasta  support. #2. Ulcerative colitis, improved. #3. Depression and anxiety disorder, improved.  #4. Past history of prostate cancer #5. Tremors associated with Compazine. #6. Thrombocytopenia secondary to previous chemotherapy, not low enough  to hold off treatment today.   PLAN:  #1. Bendamustine/Rituxan followed by Neulasta cycle #2 to start today. #2. Discontinue Compazine. #3. Use analgesics if Neulasta produces bone pain. #4. Followup in 3 weeks to receive cycle #3. Plan is to deliver 4 cycles and refer back to gastroenterology for colonoscopy to assess response since no imaging was positive and the patient's initial evaluation.   All questions were answered. The patient knows to call the clinic with any problems, questions or concerns. We can certainly see the patient much sooner if necessary.   I spent 25 minutes counseling the patient face to face. The total time spent in the appointment was 30 minutes.    Doroteo Bradford, MD 10/11/2013 9:30 AM

## 2013-10-11 NOTE — Progress Notes (Signed)
[name] presents today for chemo and b12 injection per MD orders. B12 1000 mcg administered IM in right Upper Arm. Administration without incident. Patient tolerated well.

## 2013-10-12 ENCOUNTER — Encounter (HOSPITAL_BASED_OUTPATIENT_CLINIC_OR_DEPARTMENT_OTHER): Payer: Medicare Other

## 2013-10-12 ENCOUNTER — Inpatient Hospital Stay (HOSPITAL_COMMUNITY): Payer: Medicare Other

## 2013-10-12 VITALS — BP 127/74 | HR 73 | Temp 97.7°F | Resp 18 | Wt 223.0 lb

## 2013-10-12 DIAGNOSIS — C8310 Mantle cell lymphoma, unspecified site: Secondary | ICD-10-CM

## 2013-10-12 DIAGNOSIS — C61 Malignant neoplasm of prostate: Secondary | ICD-10-CM

## 2013-10-12 DIAGNOSIS — C8313 Mantle cell lymphoma, intra-abdominal lymph nodes: Secondary | ICD-10-CM | POA: Diagnosis not present

## 2013-10-12 DIAGNOSIS — G4733 Obstructive sleep apnea (adult) (pediatric): Secondary | ICD-10-CM

## 2013-10-12 DIAGNOSIS — Z8719 Personal history of other diseases of the digestive system: Secondary | ICD-10-CM

## 2013-10-12 DIAGNOSIS — Z5111 Encounter for antineoplastic chemotherapy: Secondary | ICD-10-CM | POA: Diagnosis not present

## 2013-10-12 LAB — BETA 2 MICROGLOBULIN, SERUM: Beta-2 Microglobulin: 2.72 mg/L — ABNORMAL HIGH (ref 1.01–1.73)

## 2013-10-12 MED ORDER — HEPARIN SOD (PORK) LOCK FLUSH 100 UNIT/ML IV SOLN
500.0000 [IU] | Freq: Once | INTRAVENOUS | Status: AC | PRN
  Administered 2013-10-12: 500 [IU]
  Filled 2013-10-12: qty 5

## 2013-10-12 MED ORDER — SODIUM CHLORIDE 0.9 % IJ SOLN
10.0000 mL | INTRAMUSCULAR | Status: DC | PRN
  Administered 2013-10-12: 10 mL

## 2013-10-12 MED ORDER — SODIUM CHLORIDE 0.9 % IV SOLN
90.0000 mg/m2 | Freq: Once | INTRAVENOUS | Status: AC
  Administered 2013-10-12: 205 mg via INTRAVENOUS
  Filled 2013-10-12: qty 41

## 2013-10-12 MED ORDER — ONDANSETRON HCL 40 MG/20ML IJ SOLN
Freq: Once | INTRAMUSCULAR | Status: AC
  Administered 2013-10-12: 8 mg via INTRAVENOUS
  Filled 2013-10-12: qty 4

## 2013-10-12 MED ORDER — SODIUM CHLORIDE 0.9 % IV SOLN
Freq: Once | INTRAVENOUS | Status: AC
  Administered 2013-10-12: 09:00:00 via INTRAVENOUS

## 2013-10-12 NOTE — Progress Notes (Signed)
.  [name] returns for day 2 chemo. Tolerating well.

## 2013-10-14 ENCOUNTER — Encounter (HOSPITAL_BASED_OUTPATIENT_CLINIC_OR_DEPARTMENT_OTHER): Payer: Medicare Other

## 2013-10-14 ENCOUNTER — Other Ambulatory Visit (HOSPITAL_COMMUNITY): Payer: Self-pay | Admitting: Hematology and Oncology

## 2013-10-14 VITALS — BP 142/79 | HR 84 | Temp 97.3°F | Resp 18

## 2013-10-14 DIAGNOSIS — C8319 Mantle cell lymphoma, extranodal and solid organ sites: Secondary | ICD-10-CM

## 2013-10-14 DIAGNOSIS — Z5189 Encounter for other specified aftercare: Secondary | ICD-10-CM

## 2013-10-14 DIAGNOSIS — C61 Malignant neoplasm of prostate: Secondary | ICD-10-CM

## 2013-10-14 DIAGNOSIS — C8310 Mantle cell lymphoma, unspecified site: Secondary | ICD-10-CM

## 2013-10-14 DIAGNOSIS — G4733 Obstructive sleep apnea (adult) (pediatric): Secondary | ICD-10-CM

## 2013-10-14 DIAGNOSIS — Z8719 Personal history of other diseases of the digestive system: Secondary | ICD-10-CM

## 2013-10-14 MED ORDER — METOCLOPRAMIDE HCL 10 MG PO TABS: ORAL_TABLET | ORAL | Status: DC

## 2013-10-14 MED ORDER — PEGFILGRASTIM INJECTION 6 MG/0.6ML
6.0000 mg | Freq: Once | SUBCUTANEOUS | Status: AC
  Administered 2013-10-14: 6 mg via SUBCUTANEOUS

## 2013-10-14 MED ORDER — LORAZEPAM 0.5 MG PO TABS: ORAL_TABLET | ORAL | Status: DC

## 2013-10-14 MED ORDER — PEGFILGRASTIM INJECTION 6 MG/0.6ML
SUBCUTANEOUS | Status: AC
  Filled 2013-10-14: qty 0.6

## 2013-10-14 NOTE — Progress Notes (Signed)
[name] presents today for injection per MD orders. Neulasta 6mg  administered SQ in left Abdomen. Administration without incident. Patient tolerated well.

## 2013-10-15 DIAGNOSIS — B351 Tinea unguium: Secondary | ICD-10-CM | POA: Diagnosis not present

## 2013-11-08 ENCOUNTER — Encounter (HOSPITAL_COMMUNITY): Payer: Self-pay

## 2013-11-08 ENCOUNTER — Encounter (HOSPITAL_BASED_OUTPATIENT_CLINIC_OR_DEPARTMENT_OTHER): Payer: Medicare Other

## 2013-11-08 ENCOUNTER — Encounter (HOSPITAL_COMMUNITY): Payer: Medicare Other | Attending: Hematology and Oncology

## 2013-11-08 ENCOUNTER — Other Ambulatory Visit (HOSPITAL_COMMUNITY): Payer: Self-pay | Admitting: Hematology and Oncology

## 2013-11-08 VITALS — BP 143/78 | HR 76 | Resp 20 | Wt 222.0 lb

## 2013-11-08 DIAGNOSIS — C61 Malignant neoplasm of prostate: Secondary | ICD-10-CM | POA: Diagnosis not present

## 2013-11-08 DIAGNOSIS — C8310 Mantle cell lymphoma, unspecified site: Secondary | ICD-10-CM

## 2013-11-08 DIAGNOSIS — K5190 Ulcerative colitis, unspecified, without complications: Secondary | ICD-10-CM

## 2013-11-08 DIAGNOSIS — C8319 Mantle cell lymphoma, extranodal and solid organ sites: Secondary | ICD-10-CM | POA: Insufficient documentation

## 2013-11-08 DIAGNOSIS — Z8546 Personal history of malignant neoplasm of prostate: Secondary | ICD-10-CM | POA: Diagnosis not present

## 2013-11-08 DIAGNOSIS — Z5112 Encounter for antineoplastic immunotherapy: Secondary | ICD-10-CM | POA: Diagnosis not present

## 2013-11-08 DIAGNOSIS — D6959 Other secondary thrombocytopenia: Secondary | ICD-10-CM | POA: Diagnosis not present

## 2013-11-08 DIAGNOSIS — G4733 Obstructive sleep apnea (adult) (pediatric): Secondary | ICD-10-CM | POA: Insufficient documentation

## 2013-11-08 DIAGNOSIS — Z8719 Personal history of other diseases of the digestive system: Secondary | ICD-10-CM | POA: Diagnosis not present

## 2013-11-08 DIAGNOSIS — R5381 Other malaise: Secondary | ICD-10-CM

## 2013-11-08 DIAGNOSIS — R5383 Other fatigue: Secondary | ICD-10-CM | POA: Diagnosis not present

## 2013-11-08 DIAGNOSIS — Z5111 Encounter for antineoplastic chemotherapy: Secondary | ICD-10-CM | POA: Diagnosis not present

## 2013-11-08 DIAGNOSIS — K627 Radiation proctitis: Secondary | ICD-10-CM

## 2013-11-08 LAB — CBC WITH DIFFERENTIAL/PLATELET
Basophils Absolute: 0 10*3/uL (ref 0.0–0.1)
Basophils Relative: 1 % (ref 0–1)
EOS ABS: 0.1 10*3/uL (ref 0.0–0.7)
Eosinophils Relative: 2 % (ref 0–5)
HCT: 37.6 % — ABNORMAL LOW (ref 39.0–52.0)
HEMOGLOBIN: 13.1 g/dL (ref 13.0–17.0)
LYMPHS ABS: 0.6 10*3/uL — AB (ref 0.7–4.0)
LYMPHS PCT: 16 % (ref 12–46)
MCH: 31.4 pg (ref 26.0–34.0)
MCHC: 34.8 g/dL (ref 30.0–36.0)
MCV: 90.2 fL (ref 78.0–100.0)
MONOS PCT: 9 % (ref 3–12)
Monocytes Absolute: 0.4 10*3/uL (ref 0.1–1.0)
Neutro Abs: 2.8 10*3/uL (ref 1.7–7.7)
Neutrophils Relative %: 73 % (ref 43–77)
Platelets: 139 10*3/uL — ABNORMAL LOW (ref 150–400)
RBC: 4.17 MIL/uL — AB (ref 4.22–5.81)
RDW: 16.4 % — ABNORMAL HIGH (ref 11.5–15.5)
WBC: 3.9 10*3/uL — AB (ref 4.0–10.5)

## 2013-11-08 LAB — COMPREHENSIVE METABOLIC PANEL
ALK PHOS: 52 U/L (ref 39–117)
ALT: 19 U/L (ref 0–53)
AST: 20 U/L (ref 0–37)
Albumin: 3.9 g/dL (ref 3.5–5.2)
BUN: 18 mg/dL (ref 6–23)
CO2: 27 meq/L (ref 19–32)
Calcium: 9.1 mg/dL (ref 8.4–10.5)
Chloride: 104 mEq/L (ref 96–112)
Creatinine, Ser: 1.26 mg/dL (ref 0.50–1.35)
GFR calc non Af Amer: 54 mL/min — ABNORMAL LOW (ref >90)
GFR, EST AFRICAN AMERICAN: 62 mL/min — AB (ref >90)
GLUCOSE: 184 mg/dL — AB (ref 70–99)
POTASSIUM: 4.1 meq/L (ref 3.7–5.3)
Sodium: 144 mEq/L (ref 137–147)
TOTAL PROTEIN: 6.7 g/dL (ref 6.0–8.3)
Total Bilirubin: 0.5 mg/dL (ref 0.3–1.2)

## 2013-11-08 MED ORDER — CYANOCOBALAMIN 1000 MCG/ML IJ SOLN
1000.0000 ug | INTRAMUSCULAR | Status: DC
  Administered 2013-11-08: 1000 ug via INTRAMUSCULAR
  Filled 2013-11-08: qty 1

## 2013-11-08 MED ORDER — SODIUM CHLORIDE 0.9 % IV SOLN
90.0000 mg/m2 | Freq: Once | INTRAVENOUS | Status: AC
  Administered 2013-11-08: 207 mg via INTRAVENOUS
  Filled 2013-11-08: qty 2

## 2013-11-08 MED ORDER — SODIUM CHLORIDE 0.9 % IJ SOLN
10.0000 mL | INTRAMUSCULAR | Status: DC | PRN
  Administered 2013-11-08: 10 mL

## 2013-11-08 MED ORDER — RITUXIMAB CHEMO INJECTION 10 MG/ML
375.0000 mg/m2 | Freq: Once | INTRAVENOUS | Status: AC
  Administered 2013-11-08: 900 mg via INTRAVENOUS
  Filled 2013-11-08: qty 50

## 2013-11-08 MED ORDER — SODIUM CHLORIDE 0.9 % IV SOLN
Freq: Once | INTRAVENOUS | Status: AC
  Administered 2013-11-08: 8 mg via INTRAVENOUS
  Filled 2013-11-08: qty 4

## 2013-11-08 MED ORDER — DIPHENHYDRAMINE HCL 25 MG PO CAPS: 50.0000 mg | ORAL_CAPSULE | Freq: Once | ORAL | Status: DC

## 2013-11-08 MED ORDER — HEPARIN SOD (PORK) LOCK FLUSH 100 UNIT/ML IV SOLN
500.0000 [IU] | Freq: Once | INTRAVENOUS | Status: AC | PRN
  Administered 2013-11-08: 500 [IU]
  Filled 2013-11-08: qty 5

## 2013-11-08 MED ORDER — SODIUM CHLORIDE 0.9 % IV SOLN
Freq: Once | INTRAVENOUS | Status: AC
  Administered 2013-11-08: 10:00:00 via INTRAVENOUS

## 2013-11-08 MED ORDER — ACETAMINOPHEN 325 MG PO TABS: 650.0000 mg | ORAL_TABLET | Freq: Once | ORAL | Status: DC

## 2013-11-08 NOTE — Progress Notes (Signed)
[place]  OFFICE PROGRESS NOTE  [place], [facility], [place]  [place] [address]  DIAGNOSIS: Mantle cell lymphoma  Proctitis, radiation  OSA (obstructive sleep apnea)  Chief Complaint  Patient presents with  . Mantle cell lymphoma    For cycle 3 of BR    CURRENT THERAPY: Bendamustine/Rituxan for cycle #3 today.  INTERVAL HISTORY: [name] 79 y.o. male returns for followup and continuation of treatment for mantle cell lymphoma involving the large intestine, planning to receive cycle #3 of bendamustine and Rituxan today.  He feels more fatigued and does have intermittent nausea. The nausea is worse right after treatments. He said 12 episodes of minimal rectal bleeding without abdominal cramping. He denies any vomiting. He also denies any fever, night sweats, abdominal distention, cough, wheezing, PND, orthopnea, or palpitations. He is not use his CPAP itching for 2 years.  MEDICAL HISTORY: Past Medical History  Diagnosis Date  . Type 2 diabetes mellitus   . 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 Mixed hyperlipidemia   . PVD (peripheral vascular disease)   . Essential hypertension, benign   . Prostate cancer   . Obstructive sleep apnea     On CPAP    INTERIM HISTORY: has Mixed hyperlipidemia; HYPERTENSION, BENIGN; Coronary atherosclerosis of native coronary artery; VASOMOTOR RHINITIS; GERD; OSA (obstructive sleep apnea); H/O ulcerative colitis; Mantle cell lymphoma; Prostate cancer; and Proctitis, radiation on his problem list.    ALLERGIES:  is allergic to bee venom; adhesive; ciprofloxacin; codeine; iodine; latex; povidone-iodine; and simvastatin.  MEDICATIONS: has a current medication list which includes the following prescription(s): acetaminophen, amlodipine, atorvastatin, cyanocobalamin, diazepam, diphenhydramine,  docusate calcium, escitalopram, insulin glargine, lidocaine-prilocaine, lisinopril, lorazepam, metformin, metoclopramide, metoclopramide, oxycodone, pantoprazole, and prochlorperazine, and the following Facility-Administered Medications: cyanocobalamin.  SURGICAL HISTORY:  Past Surgical History  Procedure Laterality Date  . Tonsillectomy    . Back surgery    . Vasectomy    . Knee surgery      Right  . Shoulder surgery    . Prostatectomy    . Colonoscopy with esophagogastroduodenoscopy (egd) N/A 08/27/2013    Procedure: COLONOSCOPY WITH ESOPHAGOGASTRODUODENOSCOPY (EGD);  Surgeon: Rogene Houston, MD;  [facility];  Service: Endoscopy;  Laterality: N/A;  730  . Balloon dilation N/A 08/27/2013    Procedure: BALLOON DILATION;  Surgeon: Rogene Houston, MD;  Location: [facility];  Service: Endoscopy;  Laterality: N/A;  Venia Minks dilation N/A 08/27/2013    Procedure: Venia Minks DILATION;  Surgeon: Rogene Houston, MD;  [facility]: [facility];  Service: Endoscopy;  Laterality: N/A;  . Savory dilation N/A 08/27/2013    Procedure: SAVORY DILATION;  Surgeon: Rogene Houston, MD;  Location: [facility];  Service: Endoscopy;  Laterality: N/A;  . Portacath placement Right 2014    FAMILY HISTORY: family history includes Cancer in his brother and brother; Colon cancer in his brother; Diabetes in his father.  SOCIAL HISTORY:  reports that he has never smoked. He has never used smokeless tobacco. He reports that he does not drink alcohol or use illicit drugs.  REVIEW OF SYSTEMS:  Other than that discussed above is noncontributory.  PHYSICAL EXAMINATION: ECOG PERFORMANCE STATUS: 1 - Symptomatic but completely ambulatory  There were no vitals taken for this visit.  GENERAL:alert, no distress and comfortable. Flat  affect. SKIN: skin color, texture, turgor are normal, no rashes or significant lesions EYES: PERLA; Conjunctiva are pink and non-injected, sclera clear OROPHARYNX:no exudate, no  erythema on lips, buccal mucosa, or tongue. NECK: supple, thyroid normal size, non-tender, without nodularity. No masses CHEST: Normal AP diameter with light port in place. LYMPH:  no palpable lymphadenopathy in the cervical, axillary or inguinal LUNGS: clear to auscultation and percussion with normal breathing effort HEART: regular rate & rhythm and no murmurs. ABDOMEN:abdomen soft, non-tender and normal bowel sounds MUSCULOSKELETAL:no cyanosis of digits and no clubbing. Range of motion normal.  NEURO: alert & oriented x 3 with fluent speech, no focal motor/sensory deficits   LABORATORY DATA: Infusion on 11/08/2013  Component Date Value Ref Range Status  . Sodium 11/08/2013 144  137 - 147 mEq/L Final  . Potassium 11/08/2013 4.1  3.7 - 5.3 mEq/L Final  . Chloride 11/08/2013 104  96 - 112 mEq/L Final  . CO2 11/08/2013 27  19 - 32 mEq/L Final  . Glucose, Bld 11/08/2013 184* 70 - 99 mg/dL Final  . BUN 11/08/2013 18  6 - 23 mg/dL Final  . Creatinine, Ser 11/08/2013 1.26  0.50 - 1.35 mg/dL Final  . Calcium 11/08/2013 9.1  8.4 - 10.5 mg/dL Final  . Total Protein 11/08/2013 6.7  6.0 - 8.3 g/dL Final  . Albumin 11/08/2013 3.9  3.5 - 5.2 g/dL Final  . AST 11/08/2013 20  0 - 37 U/L Final  . ALT 11/08/2013 19  0 - 53 U/L Final  . Alkaline Phosphatase 11/08/2013 52  39 - 117 U/L Final  . Total Bilirubin 11/08/2013 0.5  0.3 - 1.2 mg/dL Final  . GFR calc non Af Amer 11/08/2013 54* >90 mL/min Final  . GFR calc Af Amer 11/08/2013 62* >90 mL/min Final   Comment: (NOTE)                          The eGFR has been calculated using the CKD EPI equation.                          This calculation has not been validated in all clinical situations.                          eGFR's persistently <90 mL/min signify possible Chronic Kidney                          Disease.  Infusion on 10/11/2013  Component Date Value Ref Range Status  . LDH 10/11/2013 165  94 - 250 U/L Final  . Beta-2 Microglobulin  10/11/2013 2.72* 1.01 - 1.73 mg/L Final   Performed at [facility]  . WBC 10/11/2013 3.7* 4.0 - 10.5 K/uL Final  . RBC 10/11/2013 4.19* 4.22 - 5.81 MIL/uL Final  . Hemoglobin 10/11/2013 13.0  13.0 - 17.0 g/dL Final  . HCT 10/11/2013 36.2* 39.0 - 52.0 % Final  . MCV 10/11/2013 86.4  78.0 - 100.0 fL Final  . MCH 10/11/2013 31.0  26.0 - 34.0 pg Final  . MCHC 10/11/2013 35.9  30.0 - 36.0 g/dL Final  . RDW 10/11/2013 14.3  11.5 - 15.5 % Final  . Platelets 10/11/2013 93* 150 - 400 K/uL Final   Comment: SPECIMEN CHECKED FOR CLOTS  CONSISTENT WITH PREVIOUS RESULT  . Neutrophils Relative % 10/11/2013 71  43 - 77 % Final  . Neutro Abs 10/11/2013 2.6  1.7 - 7.7 K/uL Final  . Lymphocytes Relative 10/11/2013 9* 12 - 46 % Final  . Lymphs Abs 10/11/2013 0.3* 0.7 - 4.0 K/uL Final  . Monocytes Relative 10/11/2013 16* 3 - 12 % Final  . Monocytes Absolute 10/11/2013 0.6  0.1 - 1.0 K/uL Final  . Eosinophils Relative 10/11/2013 3  0 - 5 % Final  . Eosinophils Absolute 10/11/2013 0.1  0.0 - 0.7 K/uL Final  . Basophils Relative 10/11/2013 1  0 - 1 % Final  . Basophils Absolute 10/11/2013 0.0  0.0 - 0.1 K/uL Final  . Sodium 10/11/2013 142  137 - 147 mEq/L Final  . Potassium 10/11/2013 4.0  3.7 - 5.3 mEq/L Final  . Chloride 10/11/2013 102  96 - 112 mEq/L Final  . CO2 10/11/2013 27  19 - 32 mEq/L Final  . Glucose, Bld 10/11/2013 106* 70 - 99 mg/dL Final  . BUN 10/11/2013 27* 6 - 23 mg/dL Final  . Creatinine, Ser 10/11/2013 1.14  0.50 - 1.35 mg/dL Final  . Calcium 10/11/2013 8.8  8.4 - 10.5 mg/dL Final  . Total Protein 10/11/2013 6.7  6.0 - 8.3 g/dL Final  . Albumin 10/11/2013 3.8  3.5 - 5.2 g/dL Final  . AST 10/11/2013 17  0 - 37 U/L Final  . ALT 10/11/2013 15  0 - 53 U/L Final  . Alkaline Phosphatase 10/11/2013 46  39 - 117 U/L Final  . Total Bilirubin 10/11/2013 0.8  0.3 - 1.2 mg/dL Final  . GFR calc non Af Amer 10/11/2013 61* >90 mL/min Final  . GFR calc Af Amer  10/11/2013 70* >90 mL/min Final   Comment: (NOTE)                          The eGFR has been calculated using the CKD EPI equation.                          This calculation has not been validated in all clinical situations.                          eGFR's persistently <90 mL/min signify possible Chronic Kidney                          Disease.    PATHOLOGY: No new pathology.  Urinalysis    Component Value Date/Time   COLORURINE YELLOW 09/19/2007 1940   APPEARANCEUR CLEAR 09/19/2007 1940   LABSPEC 1.020 09/19/2007 1940   PHURINE 5.5 09/19/2007 1940   GLUCOSEU >1000* 09/19/2007 1940   HGBUR TRACE* 09/19/2007 1940   BILIRUBINUR NEGATIVE 09/19/2007 1940   KETONESUR NEGATIVE 09/19/2007 1940   PROTEINUR NEGATIVE 09/19/2007 1940   UROBILINOGEN 0.2 09/19/2007 1940   NITRITE NEGATIVE 09/19/2007 1940   LEUKOCYTESUR NEGATIVE 09/19/2007 1940    RADIOGRAPHIC STUDIES: No results found.  ASSESSMENT:  #1.Stage I-E. mantle cell lymphoma involving the colon, for cycle 3 of chemotherapy today with bendamustine and Rituxan followed by Neulasta support.  #2. Ulcerative colitis, improved.  #3. Depression and anxiety disorder, improved.  #4. Past history of prostate cancer  #5. Tremors associated with Compazine.  #6. Thrombocytopenia secondary to previous chemotherapy, not low enough to hold off treatment today.  #7. Fatigue possibly  due to obstructive sleep apnea syndrome, not using CPAP.      PLAN:  #1. Resume using CPAP and continue metoclopramide 4 times a day. #2. Bendamustine and Rituxan intravenously today. #3. Followup in 3 weeks to receive cycle #4 of bendamustine and Rituxan after which repeat colonoscopy will be performed to assess response since imaging studies roll normal. #4. Patient was told to bring in his medications tomorrow to assess what he is actually taking.   All questions were answered. The patient knows to call the clinic with any problems, questions or concerns.  We can certainly see the patient much sooner if necessary.   I spent 25 minutes counseling the patient face to face. The total time spent in the appointment was 30 minutes.    Doroteo Bradford, MD 11/08/2013 9:16 AM

## 2013-11-08 NOTE — Progress Notes (Signed)
Per patient he took tylenol and benadryl at home today at 7 am. [name] presents today for injection per MD orders. B12 1000 mcg administered IM in right Upper Arm. Administration without incident. Patient tolerated well.

## 2013-11-08 NOTE — Progress Notes (Signed)
Labs drawn today for cbc/diff,cmp 

## 2013-11-09 ENCOUNTER — Encounter (HOSPITAL_BASED_OUTPATIENT_CLINIC_OR_DEPARTMENT_OTHER): Payer: Medicare Other

## 2013-11-09 VITALS — BP 142/80 | HR 97 | Temp 97.5°F | Resp 18 | Wt 222.4 lb

## 2013-11-09 DIAGNOSIS — C8319 Mantle cell lymphoma, extranodal and solid organ sites: Secondary | ICD-10-CM | POA: Diagnosis not present

## 2013-11-09 DIAGNOSIS — C8310 Mantle cell lymphoma, unspecified site: Secondary | ICD-10-CM

## 2013-11-09 DIAGNOSIS — Z5111 Encounter for antineoplastic chemotherapy: Secondary | ICD-10-CM | POA: Diagnosis not present

## 2013-11-09 MED ORDER — HEPARIN SOD (PORK) LOCK FLUSH 100 UNIT/ML IV SOLN
500.0000 [IU] | Freq: Once | INTRAVENOUS | Status: AC
  Administered 2013-11-09: 500 [IU] via INTRAVENOUS

## 2013-11-09 MED ORDER — SODIUM CHLORIDE 0.9 % IJ SOLN
10.0000 mL | INTRAMUSCULAR | Status: DC | PRN
  Administered 2013-11-09: 10 mL via INTRAVENOUS

## 2013-11-09 MED ORDER — SODIUM CHLORIDE 0.9 % IV SOLN
90.0000 mg/m2 | Freq: Once | INTRAVENOUS | Status: AC
  Administered 2013-11-09: 207 mg via INTRAVENOUS
  Filled 2013-11-09: qty 2.3

## 2013-11-09 MED ORDER — SODIUM CHLORIDE 0.9 % IV SOLN
INTRAVENOUS | Status: DC
  Administered 2013-11-09: 10:00:00 via INTRAVENOUS

## 2013-11-09 MED ORDER — SODIUM CHLORIDE 0.9 % IV SOLN
Freq: Once | INTRAVENOUS | Status: AC
  Administered 2013-11-09: 8 mg via INTRAVENOUS
  Filled 2013-11-09: qty 4

## 2013-11-09 MED ORDER — HEPARIN SOD (PORK) LOCK FLUSH 100 UNIT/ML IV SOLN
INTRAVENOUS | Status: AC
  Filled 2013-11-09: qty 5

## 2013-11-09 NOTE — Patient Instructions (Addendum)
[facility] Discharge Instructions for Patients Receiving Chemotherapy  Today you received the following chemotherapy agents Rituxan Day 1 and Bendamustine Day 1 and 2. We will continue chemotherapy as planned every 28 days. We will continue Neulasta injection 24-48 hours post chemo. We will continue B12 injections with chemotherapy. MD appointment again with first day chemotherapy in 4 weeks. Report any issues/concerns as needed prior to appointments.  If you develop nausea and vomiting that is not controlled by your nausea medication, call the clinic. If it is after clinic hours your family physician or the after hours number for the clinic or go to the Emergency Department.   BELOW ARE SYMPTOMS THAT SHOULD BE REPORTED IMMEDIATELY:  *FEVER GREATER THAN 101.0 F  *CHILLS WITH OR WITHOUT FEVER  NAUSEA AND VOMITING THAT IS NOT CONTROLLED WITH YOUR NAUSEA MEDICATION  *UNUSUAL SHORTNESS OF BREATH  *UNUSUAL BRUISING OR BLEEDING  TENDERNESS IN MOUTH AND THROAT WITH OR WITHOUT PRESENCE OF ULCERS  *URINARY PROBLEMS  *BOWEL PROBLEMS  UNUSUAL RASH Items with * indicate a potential emergency and should be followed up as soon as possible.  One of the nurses will contact you 24 hours after your treatment. Please let the nurse know about any problems that you may have experienced. Feel free to call the clinic you have any questions or concerns. The clinic phone number is [phone].   I have been informed and understand all the instructions given to me. I know to contact the clinic, my physician, or go to the Emergency Department if any problems should occur. I do not have any questions at this time, but understand that I may call the clinic during office hours or the Patient Navigator at [phone] should I have any questions or need assistance in obtaining follow up care.    __________________________________________  _____________  __________ Signature of Patient or  Authorized Representative            Date                   Time    __________________________________________ Nurse's Signature

## 2013-11-11 ENCOUNTER — Encounter (HOSPITAL_BASED_OUTPATIENT_CLINIC_OR_DEPARTMENT_OTHER): Payer: Medicare Other

## 2013-11-11 VITALS — BP 141/93 | HR 83 | Temp 97.4°F | Resp 18

## 2013-11-11 DIAGNOSIS — Z5189 Encounter for other specified aftercare: Secondary | ICD-10-CM | POA: Diagnosis not present

## 2013-11-11 DIAGNOSIS — C8310 Mantle cell lymphoma, unspecified site: Secondary | ICD-10-CM

## 2013-11-11 DIAGNOSIS — Z8719 Personal history of other diseases of the digestive system: Secondary | ICD-10-CM

## 2013-11-11 DIAGNOSIS — C8319 Mantle cell lymphoma, extranodal and solid organ sites: Secondary | ICD-10-CM | POA: Diagnosis not present

## 2013-11-11 DIAGNOSIS — G4733 Obstructive sleep apnea (adult) (pediatric): Secondary | ICD-10-CM

## 2013-11-11 DIAGNOSIS — C61 Malignant neoplasm of prostate: Secondary | ICD-10-CM

## 2013-11-11 MED ORDER — PEGFILGRASTIM INJECTION 6 MG/0.6ML
6.0000 mg | Freq: Once | SUBCUTANEOUS | Status: AC
  Administered 2013-11-11: 6 mg via SUBCUTANEOUS

## 2013-11-11 MED ORDER — PEGFILGRASTIM INJECTION 6 MG/0.6ML
SUBCUTANEOUS | Status: AC
  Filled 2013-11-11: qty 0.6

## 2013-11-11 NOTE — Progress Notes (Signed)
[name] presents today for injection per MD orders. Neulasta 6mg  administered SQ in left Abdomen. Administration without incident. Patient tolerated well. Denies complaints post chemo.

## 2013-11-16 ENCOUNTER — Ambulatory Visit (INDEPENDENT_AMBULATORY_CARE_PROVIDER_SITE_OTHER): Payer: Medicare Other | Admitting: Internal Medicine

## 2013-11-16 ENCOUNTER — Ambulatory Visit (HOSPITAL_COMMUNITY)
Admission: RE | Admit: 2013-11-16 | Discharge: 2013-11-16 | Disposition: A | Discharge status: Home / Self Care | Payer: Medicare Other | Source: Ambulatory Visit | Attending: Internal Medicine | Admitting: Internal Medicine

## 2013-11-16 ENCOUNTER — Encounter (INDEPENDENT_AMBULATORY_CARE_PROVIDER_SITE_OTHER): Payer: Self-pay | Admitting: Internal Medicine

## 2013-11-16 VITALS — BP 118/82 | HR 84 | Temp 98.0°F | Ht 71.0 in | Wt 214.7 lb

## 2013-11-16 DIAGNOSIS — R911 Solitary pulmonary nodule: Secondary | ICD-10-CM | POA: Insufficient documentation

## 2013-11-16 DIAGNOSIS — R1031 Right lower quadrant pain: Secondary | ICD-10-CM | POA: Diagnosis not present

## 2013-11-16 DIAGNOSIS — R109 Unspecified abdominal pain: Secondary | ICD-10-CM

## 2013-11-16 DIAGNOSIS — K8020 Calculus of gallbladder without cholecystitis without obstruction: Secondary | ICD-10-CM | POA: Diagnosis not present

## 2013-11-16 DIAGNOSIS — Z8546 Personal history of malignant neoplasm of prostate: Secondary | ICD-10-CM | POA: Insufficient documentation

## 2013-11-16 DIAGNOSIS — R1032 Left lower quadrant pain: Secondary | ICD-10-CM | POA: Diagnosis not present

## 2013-11-16 DIAGNOSIS — K5730 Diverticulosis of large intestine without perforation or abscess without bleeding: Secondary | ICD-10-CM | POA: Insufficient documentation

## 2013-11-16 DIAGNOSIS — Z9221 Personal history of antineoplastic chemotherapy: Secondary | ICD-10-CM | POA: Diagnosis not present

## 2013-11-16 DIAGNOSIS — I2510 Atherosclerotic heart disease of native coronary artery without angina pectoris: Secondary | ICD-10-CM

## 2013-11-16 DIAGNOSIS — C189 Malignant neoplasm of colon, unspecified: Secondary | ICD-10-CM | POA: Insufficient documentation

## 2013-11-16 MED ORDER — TRAMADOL HCL 50 MG PO TABS: 50.0000 mg | ORAL_TABLET | Freq: Four times a day (QID) | ORAL | Status: DC | PRN

## 2013-11-16 MED ORDER — IOHEXOL 300 MG/ML  SOLN
100.0000 mL | Freq: Once | INTRAMUSCULAR | Status: AC | PRN
  Administered 2013-11-16: 100 mL via INTRAVENOUS

## 2013-11-16 NOTE — Progress Notes (Signed)
Subjective:     Patient ID: [name], male   DOB: 1935-07-23, 79 y.o.   MRN: [id number]  HPI 79 yr old male presents today with c/o pain mid abdominal pain at umbilicus. He says the pain started in November.  He tells me the pain is 8/10 most of the time.  He describes the pain as constant. Nothing makes it worse. If he takes a Metoclopramide or a Lorazepam the nausea is better.  These medications do not help with the pain.  Last seen in July of 2014. Hx of ulcerative colitis. There is occasionally rectal bleeding.  Appetite is good. He has lost about 15 pounds since July. He usually has a BM x 2 a day. Stools are brown in color.  Protonix 40mg  for acid reflux.  Neulasta 6mg  sq  weekly  Hx of  mantle cell lymphoma involving the large intestine Presently taking Bendamustine and Rituxan monthly. Food does not make the pain worse. Nausea makes the pain worse. No fever.    CBC    Component Value Date/Time   WBC 3.9* 11/08/2013 0824   RBC 4.17* 11/08/2013 0824   RBC 5.02 09/14/2013 0831   HGB 13.1 11/08/2013 0824   HCT 37.6* 11/08/2013 0824   PLT 139* 11/08/2013 0824   MCV 90.2 11/08/2013 0824   MCH 31.4 11/08/2013 0824   MCHC 34.8 11/08/2013 0824   RDW 16.4* 11/08/2013 0824   LYMPHSABS 0.6* 11/08/2013 0824   MONOABS 0.4 11/08/2013 0824   EOSABS 0.1 11/08/2013 0824   BASOSABS 0.0 11/08/2013 0824    CMP     Component Value Date/Time   NA 144 11/08/2013 0824   K 4.1 11/08/2013 0824   CL 104 11/08/2013 0824   CO2 27 11/08/2013 0824   GLUCOSE 184* 11/08/2013 0824   BUN 18 11/08/2013 0824   CREATININE 1.26 11/08/2013 0824   CALCIUM 9.1 11/08/2013 0824   PROT 6.7 11/08/2013 0824   ALBUMIN 3.9 11/08/2013 0824   AST 20 11/08/2013 0824   ALT 19 11/08/2013 0824   ALKPHOS 52 11/08/2013 0824   BILITOT 0.5 11/08/2013 0824   GFRNONAA 54* 11/08/2013 0824   GFRAA 62* 11/08/2013 0824      08/2013 EGD/Colonoscopy: Cecal Withdrawal Time: 20 minutes  Impression:  EGD findings;  Single small ulcer  at distal esophagus along with erythema and edema to mucosa at GE junction. These changes are secondary to reflux esophagitis.  Erosive antral gastritis.  Esophagus dilated by passing 56 French Maloney dilator but no mucosal destruction induced.  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 Biopsy taken.  Segmental colitis involving mid sigmoid colon. Biopsy taken.  Mild sigmoid colon diverticulosis.  External hemorrhoids and single anal papilla. Biopsy:    Biopsy from fine nodules involving two thirds of colon revealed mantle cell lymphoma.   09/09/2013 CT abdomen/pelvis with CM: IMPRESSION:  1. No significant lymphadenopathy identified within the chest,  abdomen or pelvis.  2. 5 mm ground-glass attenuation sub solid nodule in the right  middle lobe. Nodules of this size and appearance do not require  specific imaging followup. This recommendation follows the consensus  statement: Recommendations for the Management of Subsolid Pulmonary  Nodules Detected at CT: A Statement from the [place] as  published in Radiology 2013; 266:304-317.  3. Multiple low-attenuation renal lesions bilaterally, the majority  of which are too small to definitively characterize. These lesions  are all favored  to represent cysts, but would require MRI with and  without IV gadolinium for definitive characterization if of clinical  concern.  4. Colonic diverticulosis without findings to suggest acute  diverticulitis at this time.         Review of Systems Past Medical History  Diagnosis Date  . Type 2 diabetes mellitus   . 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 Mixed hyperlipidemia   . PVD (peripheral vascular disease)   . Essential hypertension, benign   . Prostate cancer   . Obstructive sleep apnea     On CPAP     Past Surgical History  Procedure Laterality Date  . Tonsillectomy    . Back surgery    . Vasectomy    . Knee surgery      Right  . Shoulder surgery    . Prostatectomy    . Colonoscopy with esophagogastroduodenoscopy (egd) N/A 08/27/2013    Procedure: COLONOSCOPY WITH ESOPHAGOGASTRODUODENOSCOPY (EGD);  Surgeon: Rogene Houston, MD;  [facility];  Service: Endoscopy;  Laterality: N/A;  730  . Balloon dilation N/A 08/27/2013    Procedure: BALLOON DILATION;  Surgeon: Rogene Houston, MD;  [facility];  Service: Endoscopy;  Laterality: N/A;  Venia Minks dilation N/A 08/27/2013    Procedure: Venia Minks DILATION;  Surgeon: Rogene Houston, MD;  [facility];  Service: Endoscopy;  Laterality: N/A;  . Savory dilation N/A 08/27/2013    Procedure: SAVORY DILATION;  Surgeon: Rogene Houston, MD;  [facility];  Service: Endoscopy;  Laterality: N/A;  . Portacath placement Right 2014    Allergies  Allergen Reactions  . Bee Venom Anaphylaxis  . Adhesive [Tape] Hives  . Ciprofloxacin     Unknown   . Codeine     Unknown    . Iodine     Unknown    . Latex     Unknown    . Povidone-Iodine     Unknown    . Simvastatin     Unknown      Current Outpatient Prescriptions on File Prior to Visit  Medication Sig Dispense Refill  . acetaminophen (TYLENOL) 325 MG tablet Take 650 mg by mouth every 4 (four) hours as needed for mild pain or moderate pain. Take 2 tablets 1 hour prior to Rituxan treatment.      [name] amLODipine (NORVASC) 5 MG tablet Take 5 mg by mouth daily with breakfast.      . atorvastatin (LIPITOR) 40 MG tablet Take 40 mg by mouth 2 (two) times daily.      . Cyanocobalamin (VITAMIN B-12 IJ) Inject as directed every 30 (thirty) days.       . diazepam (VALIUM) 5 MG tablet Take 5 mg by mouth every 6 (six) hours as needed (sleep). Take one tablet at bedtime if needed for sleep.      . diphenhydrAMINE (BENADRYL) 25 MG tablet Take 25 mg by mouth  daily as needed. Take 2 tablets 1 hour prior to Rituxan treatment.      Mariane Baumgarten Calcium (STOOL SOFTENER PO) Take 1 capsule by mouth daily.      [name] escitalopram (LEXAPRO) 10 MG tablet Take 1 tablet (10 mg total) by mouth daily.  30 tablet  6  . insulin glargine (LANTUS SOLOSTAR) 100 UNIT/ML injection Inject 50 Units into the skin every morning.       [name]  lidocaine-prilocaine (EMLA) cream Apply 1 application topically daily as needed (chemo). Apply a quarter size amount to port site 1 hour prior to chemo. Do not rub in. Cover with plastic wrap.      . lisinopril (PRINIVIL,ZESTRIL) 40 MG tablet Take 40 mg by mouth daily with breakfast.      . LORazepam (ATIVAN) 0.5 MG tablet Take one tablet every 4 hours as needed for nausea.  60 tablet  2  . metFORMIN (GLUCOPHAGE) 1000 MG tablet Take 1,000 mg by mouth 2 (two) times daily with a meal.       . metoCLOPramide (REGLAN) 10 MG tablet Take one or two tablets 4 times a day before meals and at bedtime as needed for nausea.  60 tablet  4  . metoCLOPramide (REGLAN) 5 MG tablet Take 1 tablet (5 mg total) by mouth 4 (four) times daily as needed for nausea.  60 tablet  2  . pantoprazole (PROTONIX) 40 MG tablet Take 1 tablet (40 mg total) by mouth daily.  30 tablet  11  . prochlorperazine (COMPAZINE) 10 MG tablet Take 1 tablet (10 mg total) by mouth every 6 (six) hours as needed for nausea or vomiting.  60 tablet  3  . [DISCONTINUED] insulin aspart (NOVOLOG) 100 UNIT/ML injection Inject 20 Units into the skin 3 (three) times daily before meals.        . [DISCONTINUED] rosuvastatin (CRESTOR) 40 MG tablet Take 40 mg by mouth daily.         No current facility-administered medications on file prior to visit.        Objective:   Physical Exam  Filed Vitals:   11/16/13 1112  BP: 118/82  Pulse: 84  Temp: 98 F (36.7 C)  Height: 5\' 11"  (1.803 m)  Weight: 214 lb 11.2 oz (97.387 kg)   Alert and oriented. Skin warm and dry. Oral mucosa is moist.   . Sclera  anicteric, conjunctivae is pink. Thyroid not enlarged. No cervical lymphadenopathy. Lungs clear. Heart regular rate and rhythm.  Abdomen is soft. Bowel sounds are positive. No hepatomegaly. No abdominal masses felt. Slight umbilical and left lower quadrant  tenderness.  No edema to lower extremities.        Assessment:     Hx of erosive antral gastritis on recent EGD. Hx of UC and  Mantle cell lymphoma  I discussed with Dr. Laural Golden. DR. Laural Golden in room with patient Plan:    Ct abdomen/pelvis with CM. Further recommendations to follow.

## 2013-11-16 NOTE — Patient Instructions (Signed)
CT abdomen/pelvis with CM. Rx for Tramadol

## 2013-12-06 ENCOUNTER — Other Ambulatory Visit (HOSPITAL_COMMUNITY): Payer: Self-pay | Admitting: Hematology and Oncology

## 2013-12-06 ENCOUNTER — Encounter (HOSPITAL_COMMUNITY): Payer: Self-pay

## 2013-12-06 ENCOUNTER — Encounter (HOSPITAL_BASED_OUTPATIENT_CLINIC_OR_DEPARTMENT_OTHER): Payer: Medicare Other

## 2013-12-06 ENCOUNTER — Encounter (HOSPITAL_COMMUNITY): Payer: Medicare Other | Attending: Hematology and Oncology

## 2013-12-06 VITALS — BP 120/71 | HR 81 | Temp 98.0°F | Resp 16 | Wt 215.6 lb

## 2013-12-06 DIAGNOSIS — K5190 Ulcerative colitis, unspecified, without complications: Secondary | ICD-10-CM

## 2013-12-06 DIAGNOSIS — G4733 Obstructive sleep apnea (adult) (pediatric): Secondary | ICD-10-CM | POA: Diagnosis not present

## 2013-12-06 DIAGNOSIS — C8313 Mantle cell lymphoma, intra-abdominal lymph nodes: Secondary | ICD-10-CM

## 2013-12-06 DIAGNOSIS — D6959 Other secondary thrombocytopenia: Secondary | ICD-10-CM | POA: Diagnosis not present

## 2013-12-06 DIAGNOSIS — Z8546 Personal history of malignant neoplasm of prostate: Secondary | ICD-10-CM | POA: Diagnosis not present

## 2013-12-06 DIAGNOSIS — Z5111 Encounter for antineoplastic chemotherapy: Secondary | ICD-10-CM

## 2013-12-06 DIAGNOSIS — Z5112 Encounter for antineoplastic immunotherapy: Secondary | ICD-10-CM | POA: Diagnosis not present

## 2013-12-06 DIAGNOSIS — C8310 Mantle cell lymphoma, unspecified site: Secondary | ICD-10-CM

## 2013-12-06 DIAGNOSIS — Z8719 Personal history of other diseases of the digestive system: Secondary | ICD-10-CM | POA: Insufficient documentation

## 2013-12-06 DIAGNOSIS — C61 Malignant neoplasm of prostate: Secondary | ICD-10-CM | POA: Diagnosis not present

## 2013-12-06 DIAGNOSIS — C8319 Mantle cell lymphoma, extranodal and solid organ sites: Secondary | ICD-10-CM | POA: Insufficient documentation

## 2013-12-06 LAB — CBC WITH DIFFERENTIAL/PLATELET
BASOS ABS: 0 10*3/uL (ref 0.0–0.1)
Basophils Relative: 1 % (ref 0–1)
EOS PCT: 2 % (ref 0–5)
Eosinophils Absolute: 0.1 10*3/uL (ref 0.0–0.7)
HEMATOCRIT: 35.2 % — AB (ref 39.0–52.0)
HEMOGLOBIN: 12.3 g/dL — AB (ref 13.0–17.0)
Lymphocytes Relative: 23 % (ref 12–46)
Lymphs Abs: 1 10*3/uL (ref 0.7–4.0)
MCH: 32.1 pg (ref 26.0–34.0)
MCHC: 34.9 g/dL (ref 30.0–36.0)
MCV: 91.9 fL (ref 78.0–100.0)
MONO ABS: 0.5 10*3/uL (ref 0.1–1.0)
MONOS PCT: 11 % (ref 3–12)
NEUTROS ABS: 2.9 10*3/uL (ref 1.7–7.7)
Neutrophils Relative %: 64 % (ref 43–77)
Platelets: 115 10*3/uL — ABNORMAL LOW (ref 150–400)
RBC: 3.83 MIL/uL — ABNORMAL LOW (ref 4.22–5.81)
RDW: 16.3 % — AB (ref 11.5–15.5)
WBC: 4.6 10*3/uL (ref 4.0–10.5)

## 2013-12-06 LAB — COMPREHENSIVE METABOLIC PANEL
ALT: 23 U/L (ref 0–53)
AST: 25 U/L (ref 0–37)
Albumin: 4 g/dL (ref 3.5–5.2)
Alkaline Phosphatase: 46 U/L (ref 39–117)
BUN: 23 mg/dL (ref 6–23)
CALCIUM: 9.4 mg/dL (ref 8.4–10.5)
CHLORIDE: 102 meq/L (ref 96–112)
CO2: 25 meq/L (ref 19–32)
Creatinine, Ser: 1.2 mg/dL (ref 0.50–1.35)
GFR calc Af Amer: 66 mL/min — ABNORMAL LOW (ref >90)
GFR calc non Af Amer: 57 mL/min — ABNORMAL LOW (ref >90)
Glucose, Bld: 120 mg/dL — ABNORMAL HIGH (ref 70–99)
Potassium: 4.1 mEq/L (ref 3.7–5.3)
Sodium: 140 mEq/L (ref 137–147)
Total Bilirubin: 1 mg/dL (ref 0.3–1.2)
Total Protein: 6.6 g/dL (ref 6.0–8.3)

## 2013-12-06 MED ORDER — BENDAMUSTINE HCL (LYOPHILIZED PWD) CHEMO INJECTION 100MG
90.0000 mg/m2 | Freq: Once | INTRAVENOUS | Status: DC
  Filled 2013-12-06: qty 41

## 2013-12-06 MED ORDER — SODIUM CHLORIDE 0.9 % IV SOLN
90.0000 mg/m2 | Freq: Once | INTRAVENOUS | Status: AC
  Administered 2013-12-06: 207 mg via INTRAVENOUS
  Filled 2013-12-06: qty 2

## 2013-12-06 MED ORDER — DIPHENHYDRAMINE HCL 25 MG PO CAPS: 50.0000 mg | ORAL_CAPSULE | Freq: Once | ORAL | Status: DC

## 2013-12-06 MED ORDER — SODIUM CHLORIDE 0.9 % IJ SOLN
10.0000 mL | INTRAMUSCULAR | Status: DC | PRN
  Administered 2013-12-06: 10 mL

## 2013-12-06 MED ORDER — CYANOCOBALAMIN 1000 MCG/ML IJ SOLN
INTRAMUSCULAR | Status: AC
  Filled 2013-12-06: qty 1

## 2013-12-06 MED ORDER — CYANOCOBALAMIN 1000 MCG/ML IJ SOLN
1000.0000 ug | INTRAMUSCULAR | Status: DC
  Administered 2013-12-06: 1000 ug via INTRAMUSCULAR

## 2013-12-06 MED ORDER — ACETAMINOPHEN 325 MG PO TABS: 650.0000 mg | ORAL_TABLET | Freq: Once | ORAL | Status: DC

## 2013-12-06 MED ORDER — SODIUM CHLORIDE 0.9 % IV SOLN
Freq: Once | INTRAVENOUS | Status: AC
  Administered 2013-12-06: 8 mg via INTRAVENOUS
  Filled 2013-12-06: qty 4

## 2013-12-06 MED ORDER — SODIUM CHLORIDE 0.9 % IV SOLN
375.0000 mg/m2 | Freq: Once | INTRAVENOUS | Status: AC
  Administered 2013-12-06: 900 mg via INTRAVENOUS
  Filled 2013-12-06: qty 50

## 2013-12-06 MED ORDER — HEPARIN SOD (PORK) LOCK FLUSH 100 UNIT/ML IV SOLN
500.0000 [IU] | Freq: Once | INTRAVENOUS | Status: AC | PRN
  Administered 2013-12-06: 500 [IU]
  Filled 2013-12-06: qty 5

## 2013-12-06 MED ORDER — SODIUM CHLORIDE 0.9 % IV SOLN
Freq: Once | INTRAVENOUS | Status: AC
  Administered 2013-12-06: 09:00:00 via INTRAVENOUS

## 2013-12-06 NOTE — Progress Notes (Signed)
[name]  OFFICE PROGRESS NOTE  Oral, [facility], MD  [place] [address]  DIAGNOSIS: Mantle cell lymphoma  Prostate cancer  OSA (obstructive sleep apnea)  Chief Complaint  Patient presents with  . Mantle cell lymphoma of the colon    In the mustine/Rituxan cycle #4 today    CURRENT THERAPY: Bendamustine/Rituxan with Neulasta support, cycle #4 today.  INTERVAL HISTORY: [name] 79 y.o. male returns for followup and continuation of treatment for mantle cell lymphoma of the colon to receive cycle #4 of bendamustine/Rituxan followed by Neulasta.  Patient denies any abdominal pain, diarrhea, melena, hematochezia, hematuria, fever, night sweats, easy satiety, worsening fatigue, problems sleeping, cough, wheezing, sore throat, skin rash, headache, or seizures. Nausea is well controlled. He does not suffer significant bone pain from Neulasta.  MEDICAL HISTORY: Past Medical History  Diagnosis Date  . Type 2 diabetes mellitus   . 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 Mixed hyperlipidemia   . PVD (peripheral vascular disease)   . Essential hypertension, benign   . Prostate cancer   . Obstructive sleep apnea     On CPAP    INTERIM HISTORY: has Mixed hyperlipidemia; HYPERTENSION, BENIGN; Coronary atherosclerosis of native coronary artery; VASOMOTOR RHINITIS; GERD; OSA (obstructive sleep apnea); H/O ulcerative colitis; Mantle cell lymphoma; Prostate cancer; Proctitis, radiation; Abdominal pain, left lower quadrant; and Abdominal pain, unspecified site on his problem list.    ALLERGIES:  is allergic to bee venom; adhesive; ciprofloxacin; codeine; iodine; latex; povidone-iodine; and simvastatin.  MEDICATIONS: has a current medication list which includes the following prescription(s): acetaminophen, amlodipine, atorvastatin,  cyanocobalamin, diphenhydramine, escitalopram, insulin glargine, lidocaine-prilocaine, lisinopril, lorazepam, metformin, metoclopramide, metoclopramide, pantoprazole, prochlorperazine, tramadol, diazepam, and docusate calcium, and the following Facility-Administered Medications: acetaminophen, bendamustine (TREANDA) 207 mg in sodium chloride 0.9 % 500 mL chemo infusion, diphenhydramine, heparin lock flush, and sodium chloride.  SURGICAL HISTORY:  Past Surgical History  Procedure Laterality Date  . Tonsillectomy    . Back surgery    . Vasectomy    . Knee surgery      Right  . Shoulder surgery    . Prostatectomy    . Colonoscopy with esophagogastroduodenoscopy (egd) N/A 08/27/2013    Procedure: COLONOSCOPY WITH ESOPHAGOGASTRODUODENOSCOPY (EGD);  Surgeon: Rogene Houston, MD;  [facility];  Service: Endoscopy;  Laterality: N/A;  730  . Balloon dilation N/A 08/27/2013    Procedure: BALLOON DILATION;  Surgeon: Rogene Houston, MD;  [facility];  Service: Endoscopy;  Laterality: N/A;  Venia Minks dilation N/A 08/27/2013    Procedure: Venia Minks DILATION;  Surgeon: Rogene Houston, MD;  [facility];  Service: Endoscopy;  Laterality: N/A;  . Savory dilation N/A 08/27/2013    Procedure: SAVORY DILATION;  Surgeon: Rogene Houston, MD;  Location: [facility];  Service: Endoscopy;  Laterality: N/A;  . Portacath placement Right 2014    FAMILY HISTORY: family history includes Cancer in his brother and brother; Colon cancer in his brother; Diabetes in his father.  SOCIAL HISTORY:  reports that he has never smoked. He has never used smokeless tobacco. He reports that he does not drink alcohol or use illicit drugs.  REVIEW OF SYSTEMS:  Other than that discussed above is noncontributory.  PHYSICAL EXAMINATION: ECOG PERFORMANCE STATUS: 1 - Symptomatic but completely  ambulatory  There were no vitals taken for this visit.  GENERAL:alert, no distress and comfortable SKIN:  skin color, texture, turgor are normal, no rashes or significant lesions EYES: PERLA; Conjunctiva are pink and non-injected, sclera clear OROPHARYNX:no exudate, no erythema on lips, buccal mucosa, or tongue. NECK: supple, thyroid normal size, non-tender, without nodularity. No masses CHEST: Normal AP diameter with light port in place. LYMPH:  no palpable lymphadenopathy in the cervical, axillary or inguinal LUNGS: clear to auscultation and percussion with normal breathing effort HEART: regular rate & rhythm and no murmurs. ABDOMEN:abdomen soft, non-tender and normal bowel sounds MUSCULOSKELETAL:no cyanosis of digits and no clubbing. Range of motion normal.  NEURO: alert & oriented x 3 with fluent speech, no focal motor/sensory deficits   LABORATORY DATA: Infusion on 12/06/2013  Component Date Value Ref Range Status  . WBC 12/06/2013 4.6  4.0 - 10.5 K/uL Final  . RBC 12/06/2013 3.83* 4.22 - 5.81 MIL/uL Final  . Hemoglobin 12/06/2013 12.3* 13.0 - 17.0 g/dL Final  . HCT 12/06/2013 35.2* 39.0 - 52.0 % Final  . MCV 12/06/2013 91.9  78.0 - 100.0 fL Final  . MCH 12/06/2013 32.1  26.0 - 34.0 pg Final  . MCHC 12/06/2013 34.9  30.0 - 36.0 g/dL Final  . RDW 12/06/2013 16.3* 11.5 - 15.5 % Final  . Platelets 12/06/2013 115* 150 - 400 K/uL Final   Comment: SPECIMEN CHECKED FOR CLOTS                          PLATELET COUNT CONFIRMED BY SMEAR  . Neutrophils Relative % 12/06/2013 64  43 - 77 % Final  . Neutro Abs 12/06/2013 2.9  1.7 - 7.7 K/uL Final  . Lymphocytes Relative 12/06/2013 23  12 - 46 % Final  . Lymphs Abs 12/06/2013 1.0  0.7 - 4.0 K/uL Final  . Monocytes Relative 12/06/2013 11  3 - 12 % Final  . Monocytes Absolute 12/06/2013 0.5  0.1 - 1.0 K/uL Final  . Eosinophils Relative 12/06/2013 2  0 - 5 % Final  . Eosinophils Absolute 12/06/2013 0.1  0.0 - 0.7 K/uL Final  . Basophils Relative 12/06/2013 1  0 - 1 % Final  . Basophils Absolute 12/06/2013 0.0  0.0 - 0.1 K/uL Final  . Sodium  12/06/2013 140  137 - 147 mEq/L Final  . Potassium 12/06/2013 4.1  3.7 - 5.3 mEq/L Final  . Chloride 12/06/2013 102  96 - 112 mEq/L Final  . CO2 12/06/2013 25  19 - 32 mEq/L Final  . Glucose, Bld 12/06/2013 120* 70 - 99 mg/dL Final  . BUN 12/06/2013 23  6 - 23 mg/dL Final  . Creatinine, Ser 12/06/2013 1.20  0.50 - 1.35 mg/dL Final  . Calcium 12/06/2013 9.4  8.4 - 10.5 mg/dL Final  . Total Protein 12/06/2013 6.6  6.0 - 8.3 g/dL Final  . Albumin 12/06/2013 4.0  3.5 - 5.2 g/dL Final  . AST 12/06/2013 25  0 - 37 U/L Final  . ALT 12/06/2013 23  0 - 53 U/L Final  . Alkaline Phosphatase 12/06/2013 46  39 - 117 U/L Final  . Total Bilirubin 12/06/2013 1.0  0.3 - 1.2 mg/dL Final  . GFR calc non Af Amer 12/06/2013 57* >90 mL/min Final  . GFR calc Af Amer 12/06/2013 66* >90 mL/min Final   Comment: (NOTE)  The eGFR has been calculated using the CKD EPI equation.                          This calculation has not been validated in all clinical situations.                          eGFR's persistently <90 mL/min signify possible Chronic Kidney                          Disease.  Infusion on 11/08/2013  Component Date Value Ref Range Status  . WBC 11/08/2013 3.9* 4.0 - 10.5 K/uL Final  . RBC 11/08/2013 4.17* 4.22 - 5.81 MIL/uL Final  . Hemoglobin 11/08/2013 13.1  13.0 - 17.0 g/dL Final  . HCT 11/08/2013 37.6* 39.0 - 52.0 % Final  . MCV 11/08/2013 90.2  78.0 - 100.0 fL Final  . MCH 11/08/2013 31.4  26.0 - 34.0 pg Final  . MCHC 11/08/2013 34.8  30.0 - 36.0 g/dL Final  . RDW 11/08/2013 16.4* 11.5 - 15.5 % Final  . Platelets 11/08/2013 139* 150 - 400 K/uL Final  . Neutrophils Relative % 11/08/2013 73  43 - 77 % Final  . Neutro Abs 11/08/2013 2.8  1.7 - 7.7 K/uL Final  . Lymphocytes Relative 11/08/2013 16  12 - 46 % Final  . Lymphs Abs 11/08/2013 0.6* 0.7 - 4.0 K/uL Final  . Monocytes Relative 11/08/2013 9  3 - 12 % Final  . Monocytes Absolute 11/08/2013 0.4  0.1 - 1.0 K/uL Final   . Eosinophils Relative 11/08/2013 2  0 - 5 % Final  . Eosinophils Absolute 11/08/2013 0.1  0.0 - 0.7 K/uL Final  . Basophils Relative 11/08/2013 1  0 - 1 % Final  . Basophils Absolute 11/08/2013 0.0  0.0 - 0.1 K/uL Final  . Sodium 11/08/2013 144  137 - 147 mEq/L Final  . Potassium 11/08/2013 4.1  3.7 - 5.3 mEq/L Final  . Chloride 11/08/2013 104  96 - 112 mEq/L Final  . CO2 11/08/2013 27  19 - 32 mEq/L Final  . Glucose, Bld 11/08/2013 184* 70 - 99 mg/dL Final  . BUN 11/08/2013 18  6 - 23 mg/dL Final  . Creatinine, Ser 11/08/2013 1.26  0.50 - 1.35 mg/dL Final  . Calcium 11/08/2013 9.1  8.4 - 10.5 mg/dL Final  . Total Protein 11/08/2013 6.7  6.0 - 8.3 g/dL Final  . Albumin 11/08/2013 3.9  3.5 - 5.2 g/dL Final  . AST 11/08/2013 20  0 - 37 U/L Final  . ALT 11/08/2013 19  0 - 53 U/L Final  . Alkaline Phosphatase 11/08/2013 52  39 - 117 U/L Final  . Total Bilirubin 11/08/2013 0.5  0.3 - 1.2 mg/dL Final  . GFR calc non Af Amer 11/08/2013 54* >90 mL/min Final  . GFR calc Af Amer 11/08/2013 62* >90 mL/min Final   Comment: (NOTE)                          The eGFR has been calculated using the CKD EPI equation.                          This calculation has not been validated in all clinical situations.  eGFR's persistently <90 mL/min signify possible Chronic Kidney                          Disease.    PATHOLOGY: No new pathology.  Urinalysis    Component Value Date/Time   COLORURINE YELLOW 09/19/2007 1940   APPEARANCEUR CLEAR 09/19/2007 1940   LABSPEC 1.020 09/19/2007 1940   PHURINE 5.5 09/19/2007 1940   GLUCOSEU >1000* 09/19/2007 1940   HGBUR TRACE* 09/19/2007 1940   BILIRUBINUR NEGATIVE 09/19/2007 1940   KETONESUR NEGATIVE 09/19/2007 1940   PROTEINUR NEGATIVE 09/19/2007 1940   UROBILINOGEN 0.2 09/19/2007 1940   NITRITE NEGATIVE 09/19/2007 1940   LEUKOCYTESUR NEGATIVE 09/19/2007 1940    RADIOGRAPHIC STUDIES: Ct Abdomen Pelvis W Contrast  11/16/2013    CLINICAL DATA:  New onset low to mid pelvic pain. Chronic right lower quadrant abdominal pain. History of surgery for prostate cancer in 2009. Colon cancer diagnosed in December 2014. Ongoing chemotherapy.  EXAM: CT ABDOMEN AND PELVIS WITH CONTRAST  TECHNIQUE: Multidetector CT imaging of the abdomen and pelvis was performed using the standard protocol following bolus administration of intravenous contrast.  CONTRAST:  179m OMNIPAQUE IOHEXOL 300 MG/ML  SOLN  COMPARISON:  09/06/2013  FINDINGS: Small gallstones in the gallbladder measuring up to 4 mm in maximum diameter each. No gallbladder wall thickening or pericholecystic fluid. Bilateral renal cysts are unchanged. Multiple sigmoid colon diverticula without evidence of diverticulitis. There is a mild diffuse sigmoid colon wall thickening in the area of diverticula, without significant change. A left anterior pelvic penile penile prosthesis reservoir and penile prosthesis are again demonstrated. Bilateral vasectomy clips.  There are multiple areas of short-segment colonic narrowing with no definite mass visualized. No enlarged lymph nodes. The previously described 5 mm ground-glass sub solid nodule in the right middle lobe currently measures 6 mm on image 5. Lumbar and lower thoracic spine degenerative changes.  IMPRESSION: 1. No acute abnormality. 2. Sigmoid colon diverticulosis without evidence of diverticulitis. 3. Cholelithiasis without evidence of cholecystitis. 4. 6 mm ground-glass sub solid nodule in the right middle lobe not requiring further followup as previously described.   Electronically Signed   By: SEnrique SackM.D.   On: 11/16/2013 15:03    ASSESSMENT:  #1.Stage I-E. mantle cell lymphoma involving the colon, for cycle 3 of chemotherapy today with bendamustine and Rituxan followed by Neulasta support.  #2. Ulcerative colitis, improved.  #3. Depression and anxiety disorder,  much improved.  #4. Past history of prostate cancer  #5. Tremors  associated with Compazine.  #6. Thrombocytopenia secondary to previous chemotherapy, not low enough to hold off treatment today    PLAN:  #1. Cycle 4 of bendamustine/Rituxan with Neulasta support to begin today. #2. Followup with Dr. RLaural Goldenon in 3 weeks for repeat colonoscopy to assess response since all scans were negative prior to initiation of therapy. #3. Followup in 4 weeks to receive cycle #5 of treatment of a planned 6. Following 6 cycles of combination therapy, patient will begin maintenance Rituxan every 2-3 months for 2 years. #4. The patient expressed an understanding of this strategy.   All questions were answered. The patient knows to call the clinic with any problems, questions or concerns. We can certainly see the patient much sooner if necessary.   I spent 25 minutes counseling the patient face to face. The total time spent in the appointment was 30 minutes.    FDoroteo Bradford MD 12/06/2013 10:22 AM

## 2013-12-06 NOTE — Progress Notes (Signed)
Pt reports he took tylenol and benadryl apx 730 this am prior to appointment. [name] presents today for injection per MD orders. B12 1000 mcg administered SQ in right Upper Arm. Administration without incident. Patient tolerated well.

## 2013-12-06 NOTE — Patient Instructions (Signed)
[facility] Discharge Instructions  RECOMMENDATIONS MADE BY THE CONSULTANT AND ANY TEST RESULTS WILL BE SENT TO YOUR REFERRING PHYSICIAN. PLAN:   Cycle 4 of bendamustine/Rituxan with Neulasta support to begin today.   Followup with Dr. Laural Golden on in 3 weeks for repeat colonoscopy to assess response since all scans were negative prior to initiation of therapy.   Followup in 4 weeks to receive cycle #5 of treatment of a planned 6. Following 6 cycles of combination therapy, patient will begin maintenance Rituxan every 2-3 months for 2 years.   The patient expressed an understanding of this strategy.       Thank you for choosing [facility] to provide your oncology and hematology care.  To afford each patient quality time with our providers, please arrive at least 15 minutes before your scheduled appointment time.  With your help, our goal is to use those 15 minutes to complete the necessary work-up to ensure our physicians have the information they need to help with your evaluation and healthcare recommendations.    Effective January 1st, 2014, we ask that you re-schedule your appointment with our physicians should you arrive 10 or more minutes late for your appointment.  We strive to give you quality time with our providers, and arriving late affects you and other patients whose appointments are after yours.    Again, thank you for choosing [facility].  Our hope is that these requests will decrease the amount of time that you wait before being seen by our physicians.       _____________________________________________________________  Should you have questions after your visit to [facility], please contact our office at [phone] between the hours of 8:30 a.m. and 5:00 p.m.  Voicemails left after 4:30 p.m. will not be returned until the following business day.  For prescription refill requests, have your pharmacy contact our office  with your prescription refill request.

## 2013-12-07 ENCOUNTER — Encounter (HOSPITAL_BASED_OUTPATIENT_CLINIC_OR_DEPARTMENT_OTHER): Payer: Medicare Other

## 2013-12-07 ENCOUNTER — Other Ambulatory Visit (HOSPITAL_COMMUNITY): Payer: Self-pay | Admitting: Hematology and Oncology

## 2013-12-07 VITALS — BP 115/67 | HR 66 | Temp 97.2°F | Resp 18

## 2013-12-07 DIAGNOSIS — C8310 Mantle cell lymphoma, unspecified site: Secondary | ICD-10-CM

## 2013-12-07 DIAGNOSIS — Z8719 Personal history of other diseases of the digestive system: Secondary | ICD-10-CM

## 2013-12-07 DIAGNOSIS — G4733 Obstructive sleep apnea (adult) (pediatric): Secondary | ICD-10-CM

## 2013-12-07 DIAGNOSIS — Z5111 Encounter for antineoplastic chemotherapy: Secondary | ICD-10-CM | POA: Diagnosis not present

## 2013-12-07 DIAGNOSIS — C8319 Mantle cell lymphoma, extranodal and solid organ sites: Secondary | ICD-10-CM | POA: Diagnosis not present

## 2013-12-07 DIAGNOSIS — C61 Malignant neoplasm of prostate: Secondary | ICD-10-CM

## 2013-12-07 MED ORDER — SODIUM CHLORIDE 0.9 % IV SOLN
Freq: Once | INTRAVENOUS | Status: AC
  Administered 2013-12-07: 8 mg via INTRAVENOUS
  Filled 2013-12-07: qty 4

## 2013-12-07 MED ORDER — SODIUM CHLORIDE 0.9 % IJ SOLN
10.0000 mL | INTRAMUSCULAR | Status: DC | PRN
  Administered 2013-12-07: 10 mL

## 2013-12-07 MED ORDER — SODIUM CHLORIDE 0.9 % IV SOLN
Freq: Once | INTRAVENOUS | Status: AC
  Administered 2013-12-07: 09:00:00 via INTRAVENOUS

## 2013-12-07 MED ORDER — SODIUM CHLORIDE 0.9 % IV SOLN
90.0000 mg/m2 | Freq: Once | INTRAVENOUS | Status: AC
  Administered 2013-12-07: 207 mg via INTRAVENOUS
  Filled 2013-12-07: qty 2

## 2013-12-07 MED ORDER — HEPARIN SOD (PORK) LOCK FLUSH 100 UNIT/ML IV SOLN
500.0000 [IU] | Freq: Once | INTRAVENOUS | Status: AC | PRN
  Administered 2013-12-07: 500 [IU]
  Filled 2013-12-07: qty 5

## 2013-12-08 ENCOUNTER — Encounter (HOSPITAL_BASED_OUTPATIENT_CLINIC_OR_DEPARTMENT_OTHER): Payer: Medicare Other

## 2013-12-08 VITALS — BP 120/62 | HR 81 | Temp 97.7°F | Resp 18

## 2013-12-08 DIAGNOSIS — C8319 Mantle cell lymphoma, extranodal and solid organ sites: Secondary | ICD-10-CM

## 2013-12-08 DIAGNOSIS — Z8719 Personal history of other diseases of the digestive system: Secondary | ICD-10-CM

## 2013-12-08 DIAGNOSIS — C61 Malignant neoplasm of prostate: Secondary | ICD-10-CM

## 2013-12-08 DIAGNOSIS — G4733 Obstructive sleep apnea (adult) (pediatric): Secondary | ICD-10-CM

## 2013-12-08 DIAGNOSIS — Z5189 Encounter for other specified aftercare: Secondary | ICD-10-CM

## 2013-12-08 DIAGNOSIS — C8310 Mantle cell lymphoma, unspecified site: Secondary | ICD-10-CM

## 2013-12-08 MED ORDER — PEGFILGRASTIM INJECTION 6 MG/0.6ML
SUBCUTANEOUS | Status: AC
  Filled 2013-12-08: qty 0.6

## 2013-12-08 MED ORDER — PEGFILGRASTIM INJECTION 6 MG/0.6ML
6.0000 mg | Freq: Once | SUBCUTANEOUS | Status: AC
  Administered 2013-12-08: 6 mg via SUBCUTANEOUS

## 2013-12-08 NOTE — Progress Notes (Signed)
[name] presents today for injection per MD orders. Neulasta 6mg  administered SQ in right Abdomen. Administration without incident. Patient tolerated well.

## 2013-12-09 ENCOUNTER — Ambulatory Visit (HOSPITAL_COMMUNITY): Payer: Medicare Other

## 2013-12-10 ENCOUNTER — Encounter (HOSPITAL_COMMUNITY): Payer: Self-pay | Admitting: Pharmacy Technician

## 2013-12-10 ENCOUNTER — Other Ambulatory Visit (INDEPENDENT_AMBULATORY_CARE_PROVIDER_SITE_OTHER): Payer: Self-pay | Admitting: *Deleted

## 2013-12-10 ENCOUNTER — Telehealth (INDEPENDENT_AMBULATORY_CARE_PROVIDER_SITE_OTHER): Payer: Self-pay | Admitting: *Deleted

## 2013-12-10 DIAGNOSIS — Z1211 Encounter for screening for malignant neoplasm of colon: Secondary | ICD-10-CM

## 2013-12-10 DIAGNOSIS — C8310 Mantle cell lymphoma, unspecified site: Secondary | ICD-10-CM

## 2013-12-10 NOTE — Telephone Encounter (Signed)
  Procedure: tcs  Reason/Indication:  Mantle cell lymphoma  Has patient had this procedure before?  Yes, 08/2013 -- epic  If so, when, by whom and where?    Is there a family history of colon cancer?    Who?  What age when diagnosed?    Is patient diabetic?   yes      Does patient have prosthetic heart valve?  no  Do you have a pacemaker?  no  Has patient ever had endocarditis? no  Has patient had joint replacement within last 12 months?  no  Does patient tend to be constipated or take laxatives?   Is patient on Coumadin, Plavix and/or Aspirin? no  Medications: see EPIC  Allergies: see epic  Medication Adjustment: 1/2 DM meds day before & hold morning of  Procedure date & time: 12/17/13

## 2013-12-10 NOTE — Telephone Encounter (Signed)
Patient needs movi prep 

## 2013-12-13 MED ORDER — PEG-KCL-NACL-NASULF-NA ASC-C 100 G PO SOLR: 1.0000 | Freq: Once | ORAL | Status: DC

## 2013-12-13 NOTE — Telephone Encounter (Signed)
agree

## 2013-12-17 ENCOUNTER — Ambulatory Visit (HOSPITAL_COMMUNITY)
Admission: RE | Admit: 2013-12-17 | Discharge: 2013-12-17 | Disposition: A | Discharge status: Home / Self Care | Payer: Medicare Other | Source: Ambulatory Visit | Attending: Internal Medicine | Admitting: Internal Medicine

## 2013-12-17 ENCOUNTER — Encounter (HOSPITAL_COMMUNITY)
Admission: RE | Disposition: A | Discharge status: Home / Self Care | Payer: Self-pay | Source: Ambulatory Visit | Attending: Internal Medicine

## 2013-12-17 ENCOUNTER — Encounter (HOSPITAL_COMMUNITY): Payer: Self-pay | Admitting: *Deleted

## 2013-12-17 DIAGNOSIS — E119 Type 2 diabetes mellitus without complications: Secondary | ICD-10-CM | POA: Insufficient documentation

## 2013-12-17 DIAGNOSIS — K5730 Diverticulosis of large intestine without perforation or abscess without bleeding: Secondary | ICD-10-CM | POA: Diagnosis not present

## 2013-12-17 DIAGNOSIS — Z09 Encounter for follow-up examination after completed treatment for conditions other than malignant neoplasm: Secondary | ICD-10-CM | POA: Insufficient documentation

## 2013-12-17 DIAGNOSIS — D378 Neoplasm of uncertain behavior of other specified digestive organs: Secondary | ICD-10-CM | POA: Diagnosis not present

## 2013-12-17 DIAGNOSIS — C8310 Mantle cell lymphoma, unspecified site: Secondary | ICD-10-CM

## 2013-12-17 DIAGNOSIS — Z9221 Personal history of antineoplastic chemotherapy: Secondary | ICD-10-CM | POA: Diagnosis not present

## 2013-12-17 DIAGNOSIS — C8589 Other specified types of non-Hodgkin lymphoma, extranodal and solid organ sites: Secondary | ICD-10-CM | POA: Insufficient documentation

## 2013-12-17 DIAGNOSIS — K644 Residual hemorrhoidal skin tags: Secondary | ICD-10-CM | POA: Insufficient documentation

## 2013-12-17 DIAGNOSIS — Z794 Long term (current) use of insulin: Secondary | ICD-10-CM | POA: Diagnosis not present

## 2013-12-17 DIAGNOSIS — D371 Neoplasm of uncertain behavior of stomach: Secondary | ICD-10-CM | POA: Diagnosis not present

## 2013-12-17 HISTORY — PX: COLONOSCOPY: SHX5424

## 2013-12-17 LAB — GLUCOSE, CAPILLARY: GLUCOSE-CAPILLARY: 137 mg/dL — AB (ref 70–99)

## 2013-12-17 SURGERY — COLONOSCOPY
Anesthesia: Moderate Sedation

## 2013-12-17 MED ORDER — MEPERIDINE HCL 50 MG/ML IJ SOLN
INTRAMUSCULAR | Status: DC | PRN
  Administered 2013-12-17 (×2): 25 mg via INTRAVENOUS

## 2013-12-17 MED ORDER — SODIUM CHLORIDE 0.9 % IV SOLN
INTRAVENOUS | Status: DC
  Administered 2013-12-17: 1000 mL via INTRAVENOUS

## 2013-12-17 MED ORDER — MIDAZOLAM HCL 5 MG/5ML IJ SOLN
INTRAMUSCULAR | Status: AC
  Filled 2013-12-17: qty 10

## 2013-12-17 MED ORDER — STERILE WATER FOR IRRIGATION IR SOLN
Status: DC | PRN
  Administered 2013-12-17: 10:00:00

## 2013-12-17 MED ORDER — MIDAZOLAM HCL 5 MG/5ML IJ SOLN
INTRAMUSCULAR | Status: DC | PRN
  Administered 2013-12-17 (×3): 2 mg via INTRAVENOUS
  Administered 2013-12-17: 1 mg via INTRAVENOUS
  Administered 2013-12-17: 2 mg via INTRAVENOUS

## 2013-12-17 MED ORDER — MEPERIDINE HCL 50 MG/ML IJ SOLN
INTRAMUSCULAR | Status: AC
  Filled 2013-12-17: qty 1

## 2013-12-17 NOTE — H&P (Signed)
[name] is an 79 y.o. male.   Chief Complaint: Patient is here for colonoscopy. HPI: Patient is 67 old Caucasian male with history of ulcerative colitis and underwent colonoscopy in December 2014 and noted to have nodular mucosa and biopsy revealed mantle cell lymphoma. He's finished chemotherapy. He is feeling fine. He has lost 11 pounds since he began chemotherapy. His appetite is is good. He should he has 2 formed stools daily. He denies melena or rectal bleeding. He did notice blood on the tissue after he took prep yesterday.  Past Medical History  Diagnosis Date  . Type 2 diabetes mellitus   . 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 Mixed hyperlipidemia   . PVD (peripheral vascular disease)   . Essential hypertension, benign   . Prostate cancer   . Obstructive sleep apnea     On CPAP    Past Surgical History  Procedure Laterality Date  . Tonsillectomy    . Back surgery    . Vasectomy    . Knee surgery      Right  . Shoulder surgery    . Prostatectomy    . Colonoscopy with esophagogastroduodenoscopy (egd) N/A 08/27/2013    Procedure: COLONOSCOPY WITH ESOPHAGOGASTRODUODENOSCOPY (EGD);  Surgeon: Rogene Houston, MD;  [facility];  Service: Endoscopy;  Laterality: N/A;  730  . Balloon dilation N/A 08/27/2013    Procedure: BALLOON DILATION;  Surgeon: Rogene Houston, MD;  [facility];  Service: Endoscopy;  Laterality: N/A;  Venia Minks dilation N/A 08/27/2013    Procedure: Venia Minks DILATION;  Surgeon: Rogene Houston, MD;  [facility];  Service: Endoscopy;  Laterality: N/A;  . Savory dilation N/A 08/27/2013    Procedure: SAVORY DILATION;  Surgeon: Rogene Houston, MD;  [facility];  Service: Endoscopy;  Laterality: N/A;  . Portacath placement Right 2014    Family History  Problem Relation Age of Onset  . Colon cancer Brother   .  Cancer Brother   . Diabetes Father   . Cancer Brother    Social History:  reports that he has never smoked. He has never used smokeless tobacco. He reports that he does not drink alcohol or use illicit drugs.  Allergies:  Allergies  Allergen Reactions  . Bee Venom Anaphylaxis  . Adhesive [Tape] Hives  . Ciprofloxacin     Unknown   . Codeine     Unknown    . Iodine     Unknown    . Latex     Unknown    . Povidone-Iodine     Unknown    . Simvastatin     Unknown      Medications Prior to Admission  Medication Sig Dispense Refill  . acetaminophen (TYLENOL) 325 MG tablet Take 650 mg by mouth every 4 (four) hours as needed for mild pain or moderate pain. Take 2 tablets 1 hour prior to Rituxan treatment.      [name] amLODipine (NORVASC) 5 MG tablet Take 5 mg by mouth daily with breakfast.      . atorvastatin (LIPITOR) 40 MG tablet Take 40 mg by mouth daily.       [name] escitalopram (LEXAPRO) 10 MG tablet Take 1 tablet (10 mg total) by mouth daily.  30 tablet  6  . insulin glargine (LANTUS SOLOSTAR) 100 UNIT/ML injection Inject 50 Units into the  skin every morning.       . lidocaine-prilocaine (EMLA) cream Apply 1 application topically daily as needed (chemo). Apply a quarter size amount to port site 1 hour prior to chemo. Do not rub in. Cover with plastic wrap.      [name] LORazepam (ATIVAN) 0.5 MG tablet Take one tablet every 4 hours as needed for nausea.  60 tablet  2  . metFORMIN (GLUCOPHAGE) 1000 MG tablet Take 1,000 mg by mouth 2 (two) times daily with a meal.       . metoCLOPramide (REGLAN) 10 MG tablet Take one or two tablets 4 times a day before meals and at bedtime as needed for nausea.  60 tablet  4  . pantoprazole (PROTONIX) 40 MG tablet Take 1 tablet (40 mg total) by mouth daily.  30 tablet  11  . peg 3350 powder (MOVIPREP) 100 G SOLR Take 1 kit (200 g total) by mouth once.  1 kit  0  . prochlorperazine (COMPAZINE) 10 MG tablet Take 1 tablet (10 mg total) by mouth every 6 (six)  hours as needed for nausea or vomiting.  60 tablet  3  . Cyanocobalamin (VITAMIN B-12 IJ) Inject as directed every 30 (thirty) days.       . diazepam (VALIUM) 5 MG tablet Take 5 mg by mouth every 6 (six) hours as needed (sleep). Take one tablet at bedtime if needed for sleep.      . diphenhydrAMINE (BENADRYL) 25 MG tablet Take 25 mg by mouth daily as needed. Take 2 tablets 1 hour prior to Rituxan treatment.      [name] lisinopril (PRINIVIL,ZESTRIL) 40 MG tablet Take 40 mg by mouth daily with breakfast.      . traMADol (ULTRAM) 50 MG tablet Take 1 tablet (50 mg total) by mouth every 6 (six) hours as needed.  60 tablet  0    Results for orders placed during the hospital encounter of 12/17/13 (from the past 48 hour(s))  GLUCOSE, CAPILLARY     Status: Abnormal   Collection Time    12/17/13  9:19 AM      Result Value Ref Range   Glucose-Capillary 137 (*) 70 - 99 mg/dL   No results found.  ROS  Blood pressure 108/90, pulse 76, temperature 97.8 F (36.6 C), temperature source Oral, resp. rate 16, SpO2 95.00%. Physical Exam  Constitutional: He appears well-developed and well-nourished.  HENT:  Mouth/Throat: Oropharynx is clear and moist.  Eyes: Conjunctivae are normal. No scleral icterus.  Neck: No thyromegaly present.  Cardiovascular: Normal rate, regular rhythm and normal heart sounds.   No murmur heard. Respiratory: Effort normal and breath sounds normal.  GI: Soft. Tenderness: mild tenderness at the hypogastric region both the superficial and deep palpation. No organomegaly or masses.  Musculoskeletal: He exhibits no edema.  Lymphadenopathy:    He has no cervical adenopathy.  Neurological: He is alert.  Skin: Skin is warm and dry.     Assessment/Plan History of ulcerative colitis. History of mantle cell lymphoma of colon. Status post chemotherapy. Colonoscopy for restaging.  Dilcia Rybarczyk U 12/17/2013, 9:36 AM

## 2013-12-17 NOTE — Op Note (Addendum)
COLONOSCOPY PROCEDURE REPORT  PATIENT:  [name]  MR#:  [id number] Birthdate:  01/03/1935, 79 y.o., male Endoscopist:  Dr. Rogene Houston, MD Referred By:  Dr. Farrel Gobble, MD Procedure Date: 12/17/2013  Procedure:   Colonoscopy  Indications:  Patient is 79 year old Caucasian male with history of ulcerative colitis and underwent colonoscopy in December 2014 clozapine positive stool. He was found to have mantle cell lymphoma of the colon. He has received chemotherapy and is here for restaging of his disease. He has no symptoms other than hypogastric pain.  Informed Consent:  The procedure and risks were reviewed with the patient and informed consent was obtained.  Medications:  Demerol 50 mg IV Versed 73m IV  Description of procedure:  After a digital rectal exam was performed, that colonoscope was advanced from the anus through the rectum and colon to the area of the cecum, ileocecal valve and appendiceal orifice. The cecum was deeply intubated. These structures were well-seen and photographed for the record. From the level of the cecum and ileocecal valve, the scope was slowly and cautiously withdrawn. The mucosal surfaces were carefully surveyed utilizing scope tip to flexion to facilitate fold flattening as needed. The scope was pulled down into the rectum where a thorough exam including retroflexion was performed.  Findings:   Prep satisfactory. Normal mucosa of the various segments of the colon. Previously identified abnormality i.e. nodularity results. Random biopsies taken from mucosa of ascending and transverse colon and submitted together. Random biopsies also taken for mucosa of descending colon. Scattered diverticula and sigmoid colon. Normal rectal mucosa. Single anal papillae and small hemorrhoids below the dentate line.   Therapeutic/Diagnostic Maneuvers Performed:  See above  Complications:  None  Cecal Withdrawal Time:  18 minutes  Impression:   Examination performed to cecum. Normal mucosa throughout; previously identified abnormality i.e. nodularity has resolved.. Random biopsies taken from ascending and transverse colon as well as from the descending colon(two containers). Mild sigmoid colon diverticulosis. Small external hemorrhoids and single anal papilla.  Recommendations:  Standard instructions given. I will contact patient with biopsy results and further recommendations.  Aarit Kashuba U  12/17/2013 10:25 AM  CC: Dr. HDelphina Cahill MD & Dr. NRayne Duref. provider found C: Dr. GFarrel Gobble MD

## 2013-12-17 NOTE — Discharge Instructions (Signed)
Resume usual medications and diet. °No driving for 24 hours. °Physician will call with biopsy results. ° ° °Colonoscopy, Care After °Refer to this sheet in the next few weeks. These instructions provide you with information on caring for yourself after your procedure. Your health care provider may also give you more specific instructions. Your treatment has been planned according to current medical practices, but problems sometimes occur. Call your health care provider if you have any problems or questions after your procedure. °WHAT TO EXPECT AFTER THE PROCEDURE  °After your procedure, it is typical to have the following: °· A small amount of blood in your stool. °· Moderate amounts of gas and mild abdominal cramping or bloating. °HOME CARE INSTRUCTIONS °· Do not drive, operate machinery, or sign important documents for 24 hours. °· You may shower and resume your regular physical activities, but move at a slower pace for the first 24 hours. °· Take frequent rest periods for the first 24 hours. °· Walk around or put a warm pack on your abdomen to help reduce abdominal cramping and bloating. °· Drink enough fluids to keep your urine clear or pale yellow. °· You may resume your normal diet as instructed by your health care provider. Avoid heavy or fried foods that are hard to digest. °· Avoid drinking alcohol for 24 hours or as instructed by your health care provider. °· Only take over-the-counter or prescription medicines as directed by your health care provider. °· If a tissue sample (biopsy) was taken during your procedure: °¨ Do not take aspirin or blood thinners for 7 days, or as instructed by your health care provider. °¨ Do not drink alcohol for 7 days, or as instructed by your health care provider. °¨ Eat soft foods for the first 24 hours. °SEEK MEDICAL CARE IF: °You have persistent spotting of blood in your stool 2-3 days after the procedure. °SEEK IMMEDIATE MEDICAL CARE IF: °· You have more than a small  spotting of blood in your stool. °· You pass large blood clots in your stool. °· Your abdomen is swollen (distended). °· You have nausea or vomiting. °· You have a fever. °· You have increasing abdominal pain that is not relieved with medicine. ° °

## 2013-12-22 ENCOUNTER — Encounter (HOSPITAL_COMMUNITY): Payer: Self-pay | Admitting: Internal Medicine

## 2013-12-24 DIAGNOSIS — B351 Tinea unguium: Secondary | ICD-10-CM | POA: Diagnosis not present

## 2013-12-24 DIAGNOSIS — L851 Acquired keratosis [keratoderma] palmaris et plantaris: Secondary | ICD-10-CM | POA: Diagnosis not present

## 2013-12-24 DIAGNOSIS — E1149 Type 2 diabetes mellitus with other diabetic neurological complication: Secondary | ICD-10-CM | POA: Diagnosis not present

## 2013-12-27 ENCOUNTER — Other Ambulatory Visit (HOSPITAL_COMMUNITY): Payer: Medicare Other

## 2013-12-27 ENCOUNTER — Inpatient Hospital Stay (HOSPITAL_COMMUNITY): Payer: Medicare Other

## 2013-12-27 ENCOUNTER — Encounter (HOSPITAL_COMMUNITY): Payer: Medicare Other | Admitting: Oncology

## 2013-12-28 ENCOUNTER — Inpatient Hospital Stay (HOSPITAL_COMMUNITY): Payer: Medicare Other

## 2013-12-29 ENCOUNTER — Ambulatory Visit (HOSPITAL_COMMUNITY): Payer: Medicare Other

## 2013-12-30 ENCOUNTER — Inpatient Hospital Stay (HOSPITAL_COMMUNITY)
Admission: EM | Admit: 2013-12-30 | Discharge: 2014-01-01 | DRG: 378 | Disposition: A | Discharge status: Home / Self Care | Payer: Medicare Other | Attending: Internal Medicine | Admitting: Internal Medicine

## 2013-12-30 ENCOUNTER — Encounter (HOSPITAL_COMMUNITY): Payer: Self-pay | Admitting: Emergency Medicine

## 2013-12-30 ENCOUNTER — Telehealth (INDEPENDENT_AMBULATORY_CARE_PROVIDER_SITE_OTHER): Payer: Self-pay | Admitting: *Deleted

## 2013-12-30 DIAGNOSIS — Z85038 Personal history of other malignant neoplasm of large intestine: Secondary | ICD-10-CM | POA: Diagnosis not present

## 2013-12-30 DIAGNOSIS — E119 Type 2 diabetes mellitus without complications: Secondary | ICD-10-CM | POA: Diagnosis present

## 2013-12-30 DIAGNOSIS — R109 Unspecified abdominal pain: Secondary | ICD-10-CM

## 2013-12-30 DIAGNOSIS — K625 Hemorrhage of anus and rectum: Secondary | ICD-10-CM | POA: Diagnosis present

## 2013-12-30 DIAGNOSIS — E782 Mixed hyperlipidemia: Secondary | ICD-10-CM

## 2013-12-30 DIAGNOSIS — J309 Allergic rhinitis, unspecified: Secondary | ICD-10-CM

## 2013-12-30 DIAGNOSIS — I10 Essential (primary) hypertension: Secondary | ICD-10-CM | POA: Diagnosis not present

## 2013-12-30 DIAGNOSIS — K5732 Diverticulitis of large intestine without perforation or abscess without bleeding: Secondary | ICD-10-CM | POA: Diagnosis not present

## 2013-12-30 DIAGNOSIS — Z79899 Other long term (current) drug therapy: Secondary | ICD-10-CM

## 2013-12-30 DIAGNOSIS — K5792 Diverticulitis of intestine, part unspecified, without perforation or abscess without bleeding: Secondary | ICD-10-CM

## 2013-12-30 DIAGNOSIS — D6481 Anemia due to antineoplastic chemotherapy: Secondary | ICD-10-CM | POA: Diagnosis not present

## 2013-12-30 DIAGNOSIS — G4733 Obstructive sleep apnea (adult) (pediatric): Secondary | ICD-10-CM | POA: Diagnosis not present

## 2013-12-30 DIAGNOSIS — K922 Gastrointestinal hemorrhage, unspecified: Secondary | ICD-10-CM | POA: Diagnosis not present

## 2013-12-30 DIAGNOSIS — Z8546 Personal history of malignant neoplasm of prostate: Secondary | ICD-10-CM | POA: Diagnosis not present

## 2013-12-30 DIAGNOSIS — C61 Malignant neoplasm of prostate: Secondary | ICD-10-CM

## 2013-12-30 DIAGNOSIS — Z800 Family history of malignant neoplasm of digestive organs: Secondary | ICD-10-CM | POA: Diagnosis not present

## 2013-12-30 DIAGNOSIS — I739 Peripheral vascular disease, unspecified: Secondary | ICD-10-CM | POA: Diagnosis present

## 2013-12-30 DIAGNOSIS — T451X5A Adverse effect of antineoplastic and immunosuppressive drugs, initial encounter: Secondary | ICD-10-CM | POA: Diagnosis present

## 2013-12-30 DIAGNOSIS — Z833 Family history of diabetes mellitus: Secondary | ICD-10-CM | POA: Diagnosis not present

## 2013-12-30 DIAGNOSIS — D72819 Decreased white blood cell count, unspecified: Secondary | ICD-10-CM | POA: Diagnosis present

## 2013-12-30 DIAGNOSIS — I2510 Atherosclerotic heart disease of native coronary artery without angina pectoris: Secondary | ICD-10-CM | POA: Diagnosis present

## 2013-12-30 DIAGNOSIS — K627 Radiation proctitis: Secondary | ICD-10-CM

## 2013-12-30 DIAGNOSIS — D6959 Other secondary thrombocytopenia: Secondary | ICD-10-CM | POA: Diagnosis present

## 2013-12-30 DIAGNOSIS — Z8719 Personal history of other diseases of the digestive system: Secondary | ICD-10-CM

## 2013-12-30 DIAGNOSIS — C8319 Mantle cell lymphoma, extranodal and solid organ sites: Secondary | ICD-10-CM | POA: Diagnosis not present

## 2013-12-30 DIAGNOSIS — D709 Neutropenia, unspecified: Secondary | ICD-10-CM | POA: Diagnosis present

## 2013-12-30 DIAGNOSIS — D696 Thrombocytopenia, unspecified: Secondary | ICD-10-CM

## 2013-12-30 DIAGNOSIS — R1032 Left lower quadrant pain: Secondary | ICD-10-CM | POA: Diagnosis not present

## 2013-12-30 DIAGNOSIS — D62 Acute posthemorrhagic anemia: Secondary | ICD-10-CM

## 2013-12-30 DIAGNOSIS — Z794 Long term (current) use of insulin: Secondary | ICD-10-CM | POA: Diagnosis not present

## 2013-12-30 DIAGNOSIS — C8310 Mantle cell lymphoma, unspecified site: Secondary | ICD-10-CM | POA: Diagnosis present

## 2013-12-30 DIAGNOSIS — K5733 Diverticulitis of large intestine without perforation or abscess with bleeding: Principal | ICD-10-CM | POA: Diagnosis present

## 2013-12-30 DIAGNOSIS — K5731 Diverticulosis of large intestine without perforation or abscess with bleeding: Secondary | ICD-10-CM

## 2013-12-30 DIAGNOSIS — K219 Gastro-esophageal reflux disease without esophagitis: Secondary | ICD-10-CM

## 2013-12-30 HISTORY — DX: Malignant neoplasm of colon, unspecified: C18.9

## 2013-12-30 LAB — CBC WITH DIFFERENTIAL/PLATELET
BASOS ABS: 0 10*3/uL (ref 0.0–0.1)
Basophils Relative: 0 % (ref 0–1)
EOS ABS: 0.1 10*3/uL (ref 0.0–0.7)
Eosinophils Relative: 3 % (ref 0–5)
HEMATOCRIT: 32.7 % — AB (ref 39.0–52.0)
Hemoglobin: 11.1 g/dL — ABNORMAL LOW (ref 13.0–17.0)
LYMPHS PCT: 18 % (ref 12–46)
Lymphs Abs: 0.5 10*3/uL — ABNORMAL LOW (ref 0.7–4.0)
MCH: 32.1 pg (ref 26.0–34.0)
MCHC: 33.9 g/dL (ref 30.0–36.0)
MCV: 94.5 fL (ref 78.0–100.0)
Monocytes Absolute: 0.3 10*3/uL (ref 0.1–1.0)
Monocytes Relative: 11 % (ref 3–12)
Neutro Abs: 1.9 10*3/uL (ref 1.7–7.7)
Neutrophils Relative %: 68 % (ref 43–77)
Platelets: 101 10*3/uL — ABNORMAL LOW (ref 150–400)
RBC: 3.46 MIL/uL — ABNORMAL LOW (ref 4.22–5.81)
RDW: 15.2 % (ref 11.5–15.5)
Smear Review: DECREASED
WBC: 2.8 10*3/uL — ABNORMAL LOW (ref 4.0–10.5)

## 2013-12-30 LAB — COMPREHENSIVE METABOLIC PANEL
ALBUMIN: 4.1 g/dL (ref 3.5–5.2)
ALK PHOS: 49 U/L (ref 39–117)
ALT: 20 U/L (ref 0–53)
AST: 25 U/L (ref 0–37)
BUN: 31 mg/dL — AB (ref 6–23)
CALCIUM: 9.2 mg/dL (ref 8.4–10.5)
CO2: 26 mEq/L (ref 19–32)
CREATININE: 1.53 mg/dL — AB (ref 0.50–1.35)
Chloride: 104 mEq/L (ref 96–112)
GFR calc non Af Amer: 42 mL/min — ABNORMAL LOW (ref >90)
GFR, EST AFRICAN AMERICAN: 49 mL/min — AB (ref >90)
GLUCOSE: 162 mg/dL — AB (ref 70–99)
Potassium: 4.1 mEq/L (ref 3.7–5.3)
Sodium: 144 mEq/L (ref 137–147)
Total Bilirubin: 0.7 mg/dL (ref 0.3–1.2)
Total Protein: 6.9 g/dL (ref 6.0–8.3)

## 2013-12-30 LAB — PROTIME-INR
INR: 1.08 (ref 0.00–1.49)
PROTHROMBIN TIME: 13.8 s (ref 11.6–15.2)

## 2013-12-30 LAB — APTT: APTT: 36 s (ref 24–37)

## 2013-12-30 LAB — TYPE AND SCREEN
ABO/RH(D): A POS
ANTIBODY SCREEN: NEGATIVE

## 2013-12-30 MED ORDER — SODIUM CHLORIDE 0.9 % IV SOLN
INTRAVENOUS | Status: DC
  Administered 2013-12-30: 23:00:00 via INTRAVENOUS

## 2013-12-30 MED ORDER — PROCHLORPERAZINE MALEATE 5 MG PO TABS: 10.0000 mg | ORAL_TABLET | Freq: Four times a day (QID) | ORAL | Status: DC | PRN

## 2013-12-30 MED ORDER — PANTOPRAZOLE SODIUM 40 MG PO TBEC
40.0000 mg | DELAYED_RELEASE_TABLET | Freq: Every day | ORAL | Status: DC
  Administered 2013-12-31: 40 mg via ORAL
  Filled 2013-12-30 (×2): qty 1

## 2013-12-30 MED ORDER — AMLODIPINE BESYLATE 5 MG PO TABS
5.0000 mg | ORAL_TABLET | Freq: Every day | ORAL | Status: DC
  Administered 2013-12-31: 5 mg via ORAL
  Filled 2013-12-30: qty 1

## 2013-12-30 MED ORDER — VITAMIN B-1 100 MG PO TABS
100.0000 mg | ORAL_TABLET | Freq: Every day | ORAL | Status: DC
  Administered 2013-12-30 – 2013-12-31 (×2): 100 mg via ORAL
  Filled 2013-12-30 (×2): qty 1

## 2013-12-30 MED ORDER — ACETAMINOPHEN 325 MG PO TABS: 650.0000 mg | ORAL_TABLET | Freq: Four times a day (QID) | ORAL | Status: DC | PRN

## 2013-12-30 MED ORDER — LORAZEPAM 0.5 MG PO TABS
0.5000 mg | ORAL_TABLET | Freq: Four times a day (QID) | ORAL | Status: DC | PRN
  Administered 2013-12-31: 0.5 mg via ORAL
  Filled 2013-12-30: qty 1

## 2013-12-30 MED ORDER — METOCLOPRAMIDE HCL 10 MG PO TABS
10.0000 mg | ORAL_TABLET | Freq: Three times a day (TID) | ORAL | Status: DC
  Administered 2013-12-30 – 2013-12-31 (×5): 10 mg via ORAL
  Filled 2013-12-30 (×5): qty 1

## 2013-12-30 MED ORDER — DIAZEPAM 5 MG PO TABS
5.0000 mg | ORAL_TABLET | Freq: Four times a day (QID) | ORAL | Status: DC | PRN
  Administered 2013-12-30 – 2013-12-31 (×2): 5 mg via ORAL
  Filled 2013-12-30 (×2): qty 1

## 2013-12-30 MED ORDER — ACETAMINOPHEN 650 MG RE SUPP: 650.0000 mg | Freq: Four times a day (QID) | RECTAL | Status: DC | PRN

## 2013-12-30 MED ORDER — ONDANSETRON HCL 4 MG PO TABS: 4.0000 mg | ORAL_TABLET | Freq: Four times a day (QID) | ORAL | Status: DC | PRN

## 2013-12-30 MED ORDER — INSULIN GLARGINE 100 UNIT/ML ~~LOC~~ SOLN
50.0000 [IU] | Freq: Every day | SUBCUTANEOUS | Status: DC
  Administered 2013-12-31: 50 [IU] via SUBCUTANEOUS
  Filled 2013-12-30 (×4): qty 0.5

## 2013-12-30 MED ORDER — SODIUM CHLORIDE 0.9 % IV SOLN
INTRAVENOUS | Status: DC
  Administered 2013-12-30: 18:00:00 via INTRAVENOUS

## 2013-12-30 MED ORDER — LIDOCAINE-PRILOCAINE 2.5-2.5 % EX CREA: 1.0000 "application " | TOPICAL_CREAM | Freq: Every day | CUTANEOUS | Status: DC | PRN

## 2013-12-30 MED ORDER — SODIUM CHLORIDE 0.9 % IV BOLUS (SEPSIS)
1000.0000 mL | Freq: Once | INTRAVENOUS | Status: AC
  Administered 2013-12-30: 1000 mL via INTRAVENOUS

## 2013-12-30 MED ORDER — LISINOPRIL 10 MG PO TABS
40.0000 mg | ORAL_TABLET | Freq: Every day | ORAL | Status: DC
  Administered 2013-12-31: 40 mg via ORAL
  Filled 2013-12-30: qty 4

## 2013-12-30 MED ORDER — TRAMADOL HCL 50 MG PO TABS: 50.0000 mg | ORAL_TABLET | Freq: Four times a day (QID) | ORAL | Status: DC | PRN

## 2013-12-30 MED ORDER — METFORMIN HCL 500 MG PO TABS: 1000.0000 mg | ORAL_TABLET | Freq: Two times a day (BID) | ORAL | Status: DC

## 2013-12-30 MED ORDER — SODIUM CHLORIDE 0.9 % IJ SOLN
3.0000 mL | Freq: Two times a day (BID) | INTRAMUSCULAR | Status: DC
  Administered 2013-12-30 – 2013-12-31 (×3): 3 mL via INTRAVENOUS

## 2013-12-30 MED ORDER — HYDROCODONE-ACETAMINOPHEN 5-325 MG PO TABS
1.0000 | ORAL_TABLET | ORAL | Status: DC | PRN
  Administered 2013-12-31: 2 via ORAL
  Filled 2013-12-30: qty 2

## 2013-12-30 MED ORDER — ATORVASTATIN CALCIUM 40 MG PO TABS
40.0000 mg | ORAL_TABLET | Freq: Every day | ORAL | Status: DC
  Administered 2013-12-31: 40 mg via ORAL
  Filled 2013-12-30: qty 1

## 2013-12-30 MED ORDER — SODIUM CHLORIDE 0.9 % IV SOLN
3.0000 g | Freq: Four times a day (QID) | INTRAVENOUS | Status: DC
  Administered 2013-12-31 (×2): 3 g via INTRAVENOUS
  Filled 2013-12-30 (×15): qty 3

## 2013-12-30 MED ORDER — FOLIC ACID 1 MG PO TABS
1.0000 mg | ORAL_TABLET | Freq: Every day | ORAL | Status: DC
  Administered 2013-12-30 – 2013-12-31 (×2): 1 mg via ORAL
  Filled 2013-12-30 (×3): qty 1

## 2013-12-30 MED ORDER — ESCITALOPRAM OXALATE 10 MG PO TABS
10.0000 mg | ORAL_TABLET | Freq: Every day | ORAL | Status: DC
  Administered 2013-12-31: 10 mg via ORAL
  Filled 2013-12-30 (×2): qty 1

## 2013-12-30 MED ORDER — ONDANSETRON HCL 4 MG/2ML IJ SOLN: 4.0000 mg | Freq: Four times a day (QID) | INTRAMUSCULAR | Status: DC | PRN

## 2013-12-30 NOTE — Progress Notes (Signed)
Pharmacy stated patient should not be taking metformin is serum creatine is above 1.5 due to it being contraindicated. William Hamburger RN

## 2013-12-30 NOTE — ED Notes (Addendum)
Pt reports has had bleeding after bm for the past 6 months.  Reports today the bleeding is much heavier and is bright red.   Pt c/o "little" lower abd pain.  Pt c/o feeling dizzy and generally weak.

## 2013-12-30 NOTE — ED Notes (Signed)
Pt given diet sprite and tomato soup per request.

## 2013-12-30 NOTE — Telephone Encounter (Signed)
Mr. [name] left a telephone message stating that after he went to the bathroom earlier this morning. He had a lot of rectal bleeding He stated that he felt weak, also states that when he stands he feels dizzy. Patient was scoped of the 27th of March, and diverticular bleed noted. This was addressed with Dr.Rehman , he felt that if the patient felt like he was going to pass out that he should go to the ED for evaluation. Patient was called back and he plans to go to the ED, feels very weak and dizzy.

## 2013-12-30 NOTE — ED Provider Notes (Signed)
CSN: [id number]     Arrival date & time 12/30/13  1654 History   First MD Initiated Contact with Patient 12/30/13 1702     Chief Complaint  Patient presents with  . Rectal Bleeding     (Consider location/radiation/quality/duration/timing/severity/associated sxs/prior Treatment) Patient is a 79 y.o. male presenting with hematochezia. The history is provided by the patient.  Rectal Bleeding  patient here complaining of increased blood in his stool x3 parts. Has a history of diverticulitis as well as colon cancer. Note some increased weakness and dizziness. Denies any syncope or near-syncope. No fever or chills. No vomiting. Says that the blood in his stool did turn the toilet water pink. Symptoms persisted. Nothing makes them better. No new treatments used prior to arrival.  Past Medical History  Diagnosis Date  . Type 2 diabetes mellitus   . 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 Mixed hyperlipidemia   . PVD (peripheral vascular disease)   . Essential hypertension, benign   . Obstructive sleep apnea     On CPAP  . Prostate cancer   . Colon cancer    Past Surgical History  Procedure Laterality Date  . Tonsillectomy    . Back surgery    . Vasectomy    . Knee surgery      Right  . Shoulder surgery    . Prostatectomy    . Colonoscopy with esophagogastroduodenoscopy (egd) N/A 08/27/2013    Procedure: COLONOSCOPY WITH ESOPHAGOGASTRODUODENOSCOPY (EGD);  Surgeon: Rogene Houston, MD;  [facility]: [facility];  Service: Endoscopy;  Laterality: N/A;  730  . Balloon dilation N/A 08/27/2013    Procedure: BALLOON DILATION;  Surgeon: Rogene Houston, MD;  Location: [facility];  Service: Endoscopy;  Laterality: N/A;  Venia Minks dilation N/A 08/27/2013    Procedure: Venia Minks DILATION;  Surgeon: Rogene Houston, MD;  Location: [facility];  Service: Endoscopy;  Laterality: N/A;  . Savory dilation N/A  08/27/2013    Procedure: SAVORY DILATION;  Surgeon: Rogene Houston, MD;  [facility]: [facility];  Service: Endoscopy;  Laterality: N/A;  . Portacath placement Right 2014  . Colonoscopy N/A 12/17/2013    Procedure: COLONOSCOPY;  Surgeon: Rogene Houston, MD;  [facility]: [facility];  Service: Endoscopy;  Laterality: N/A;  940  . Colon surgery     Family History  Problem Relation Age of Onset  . Colon cancer Brother   . Cancer Brother   . Diabetes Father   . Cancer Brother    History  Substance Use Topics  . Smoking status: Never Smoker   . Smokeless tobacco: Never Used  . Alcohol Use: No    Review of Systems  Gastrointestinal: Positive for hematochezia.  All other systems reviewed and are negative.     Allergies  Bee venom; Adhesive; Ciprofloxacin; Codeine; Iodine; Latex; Povidone-iodine; and Simvastatin  Home Medications   Current Outpatient Rx  Name  Route  Sig  Dispense  Refill  . acetaminophen (TYLENOL) 325 MG tablet   Oral   Take 650 mg by mouth every 4 (four) hours as needed for mild pain or moderate pain. Take 2 tablets 1 hour prior to Rituxan treatment.         [name] amLODipine (NORVASC) 5 MG tablet   Oral   Take 5 mg by mouth daily with breakfast.         .  EXPIRED: atorvastatin (LIPITOR) 40 MG tablet   Oral   Take 40 mg by mouth daily.          . Cyanocobalamin (VITAMIN B-12 IJ)   Injection   Inject as directed every 30 (thirty) days.          . diazepam (VALIUM) 5 MG tablet   Oral   Take 5 mg by mouth every 6 (six) hours as needed (sleep). Take one tablet at bedtime if needed for sleep.         . diphenhydrAMINE (BENADRYL) 25 MG tablet   Oral   Take 25 mg by mouth daily as needed. Take 2 tablets 1 hour prior to Rituxan treatment.         [name] escitalopram (LEXAPRO) 10 MG tablet   Oral   Take 1 tablet (10 mg total) by mouth daily.   30 tablet   6   . insulin glargine (LANTUS SOLOSTAR) 100 UNIT/ML injection   Subcutaneous   Inject 50  Units into the skin every morning.          . lidocaine-prilocaine (EMLA) cream   Topical   Apply 1 application topically daily as needed (chemo). Apply a quarter size amount to port site 1 hour prior to chemo. Do not rub in. Cover with plastic wrap.         . lisinopril (PRINIVIL,ZESTRIL) 40 MG tablet   Oral   Take 40 mg by mouth daily with breakfast.         . LORazepam (ATIVAN) 0.5 MG tablet      Take one tablet every 4 hours as needed for nausea.   60 tablet   2   . metFORMIN (GLUCOPHAGE) 1000 MG tablet   Oral   Take 1,000 mg by mouth 2 (two) times daily with a meal.          . metoCLOPramide (REGLAN) 10 MG tablet      Take one or two tablets 4 times a day before meals and at bedtime as needed for nausea.   60 tablet   4   . pantoprazole (PROTONIX) 40 MG tablet   Oral   Take 1 tablet (40 mg total) by mouth daily.   30 tablet   11   . prochlorperazine (COMPAZINE) 10 MG tablet   Oral   Take 1 tablet (10 mg total) by mouth every 6 (six) hours as needed for nausea or vomiting.   60 tablet   3   . traMADol (ULTRAM) 50 MG tablet   Oral   Take 1 tablet (50 mg total) by mouth every 6 (six) hours as needed.   60 tablet   0    BP 130/69  Pulse 73  Temp(Src) 97.7 F (36.5 C) (Oral)  Resp 18  Ht 5' 11"  (1.803 m)  Wt 220 lb (99.791 kg)  BMI 30.70 kg/m2  SpO2 96% Physical Exam  Nursing note and vitals reviewed. Constitutional: He is oriented to person, place, and time. He appears well-developed and well-nourished.  Non-toxic appearance. No distress.  HENT:  Head: Normocephalic and atraumatic.  Eyes: Conjunctivae, EOM and lids are normal. Pupils are equal, round, and reactive to light.  Neck: Normal range of motion. Neck supple. No tracheal deviation present. No mass present.  Cardiovascular: Normal rate, regular rhythm and normal heart sounds.  Exam reveals no gallop.   No murmur heard. Pulmonary/Chest: Effort normal and breath sounds normal. No stridor.  No respiratory distress. He has no decreased breath  sounds. He has no wheezes. He has no rhonchi. He has no rales.  Abdominal: Soft. Normal appearance and bowel sounds are normal. He exhibits no distension. There is no tenderness. There is no rigidity, no rebound, no guarding and no CVA tenderness.  Genitourinary: Guaiac negative stool.  Musculoskeletal: Normal range of motion. He exhibits no edema and no tenderness.  Neurological: He is alert and oriented to person, place, and time. He has normal strength. No cranial nerve deficit or sensory deficit. GCS eye subscore is 4. GCS verbal subscore is 5. GCS motor subscore is 6.  Skin: Skin is warm and dry. No abrasion and no rash noted.  Psychiatric: He has a normal mood and affect. His speech is normal and behavior is normal.    ED Course  Procedures (including critical care time) Labs Review Labs Reviewed  CBC WITH DIFFERENTIAL  COMPREHENSIVE METABOLIC PANEL  PROTIME-INR  APTT  TYPE AND SCREEN   Imaging Review No results found.   EKG Interpretation None      MDM   Final diagnoses:  None    Patient to be admitted for evaluation of GI bleeding. Symptoms are likely from diverticulitis. Spoke with Dr. Humphrey Rolls and he will see the pt in the department    Leota Jacobsen, MD 12/30/13 [phone]

## 2013-12-30 NOTE — H&P (Signed)
[facility] History and Physical  [name]:[id number] DOB: April 04, 1935 DOA: 12/30/2013  Referring physician: Rodrigo Ran, MD PCP: [name], MD   Chief Complaint: Bleeding from rectum  HPI: [name] is a 79 y.o. male presents to the ED with a rectal bleed. Patient states that he has had ongoing bleeding for but of late it has increased in amount. He recently had a colonscopy done which was essentially negative. He does have ahistory of Mantle Cell Lymphoma of the colon. He also has a history of diverticulitis. Patient states that he has been feeling somewhat dizzy and felt as though he was going to pass out. He did not actually have syncope though. Denies having any chest pain presently no diaphoresis presently. He states that he has had pain in his abdomen mainly in the LLQ. He states that he has not had any radiation of the pain. There is no vomiting of blood. He has no nausea.    Review of Systems:  Constitutional:  No weight loss, night sweats, Fevers, chills, fatigue.  HEENT:  No headaches, Difficulty swallowing,Tooth/dental problems,Sore throat,  No sneezing, itching, ear ache, nasal congestion, post nasal drip,  Cardio-vascular:  No chest pain, Orthopnea, PND, swelling in lower extremities, anasarca, dizziness, palpitations  GI:  No heartburn, indigestion, ++abdominal pain, no nausea, vomiting, ++diarrhea, ++change in bowel habits,  Resp:  No shortness of breath with exertion or at rest No coughing up of blood.No change in color of mucus.No wheezing.  Skin:  no rash or lesions.  GU:  no dysuria, change in color of urine, no urgency or frequency. No flank pain.  Musculoskeletal:  No joint pain or swelling. No decreased range of motion. No back pain.  Psych:  No change in mood or affect. No depression or anxiety. No memory loss.   Past Medical History  Diagnosis Date  . Type 2 diabetes mellitus   . Coronary atherosclerosis of native coronary  artery     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, anomalous circumflex - no PCIs  . Mixed hyperlipidemia   . PVD (peripheral vascular disease)   . Essential hypertension, benign   . Obstructive sleep apnea     On CPAP  . Prostate cancer   . Colon cancer    Past Surgical History  Procedure Laterality Date  . Tonsillectomy    . Back surgery    . Vasectomy    . Knee surgery      Right  . Shoulder surgery    . Prostatectomy    . Colonoscopy with esophagogastroduodenoscopy (egd) N/A 08/27/2013    Procedure: COLONOSCOPY WITH ESOPHAGOGASTRODUODENOSCOPY (EGD);  Surgeon: Rogene Houston, MD;  [facility]: [facility];  Service: Endoscopy;  Laterality: N/A;  730  . Balloon dilation N/A 08/27/2013    Procedure: BALLOON DILATION;  Surgeon: Rogene Houston, MD;  [facility];  Service: Endoscopy;  Laterality: N/A;  Venia Minks dilation N/A 08/27/2013    Procedure: Venia Minks DILATION;  Surgeon: Rogene Houston, MD;  [facility]: [facility];  Service: Endoscopy;  Laterality: N/A;  . Savory dilation N/A 08/27/2013    Procedure: SAVORY DILATION;  Surgeon: Rogene Houston, MD;  [facility]: [facility];  Service: Endoscopy;  Laterality: N/A;  . Portacath placement Right 2014  . Colonoscopy N/A 12/17/2013    Procedure: COLONOSCOPY;  Surgeon: Rogene Houston, MD;  [facility];  Service: Endoscopy;  Laterality: N/A;  940  . Colon surgery  Social History:  reports that he has never smoked. He has never used smokeless tobacco. He reports that he does not drink alcohol or use illicit drugs.  Allergies  Allergen Reactions  . Bee Venom Anaphylaxis  . Adhesive [Tape] Hives  . Ciprofloxacin     Unknown   . Codeine     Unknown    . Iodine     Unknown    . Latex     Unknown    . Povidone-Iodine     Unknown    . Simvastatin     Unknown      Family History  Problem Relation Age of Onset  . Colon cancer Brother   . Cancer Brother   . Diabetes Father   . Cancer  Brother      Prior to Admission medications   Medication Sig Start Date End Date Taking? Authorizing Provider  acetaminophen (TYLENOL) 325 MG tablet Take 650 mg by mouth every 4 (four) hours as needed for mild pain or moderate pain. Take 2 tablets 1 hour prior to Rituxan treatment.   Yes Historical Provider, MD  amLODipine (NORVASC) 5 MG tablet Take 5 mg by mouth daily with breakfast. 12/24/12  Yes Hillary Bow, MD  atorvastatin (LIPITOR) 40 MG tablet Take 40 mg by mouth every morning.  12/24/12 12/30/13 Yes Hillary Bow, MD  Cyanocobalamin (VITAMIN B-12 IJ) Inject as directed every 30 (thirty) days.    Yes Historical Provider, MD  diazepam (VALIUM) 5 MG tablet Take 5 mg by mouth every 6 (six) hours as needed (sleep). Take one tablet at bedtime if needed for sleep. 09/07/13  Yes Farrel Gobble, MD  diphenhydrAMINE (BENADRYL) 25 MG tablet Take 25 mg by mouth daily as needed. Take 2 tablets 1 hour prior to Rituxan treatment.   Yes Historical Provider, MD  escitalopram (LEXAPRO) 10 MG tablet Take 10 mg by mouth every morning. 09/21/13  Yes Farrel Gobble, MD  insulin glargine (LANTUS SOLOSTAR) 100 UNIT/ML injection Inject 50 Units into the skin every morning.    Yes Historical Provider, MD  lidocaine-prilocaine (EMLA) cream Apply 1 application topically daily as needed (chemo). Apply a quarter size amount to port site 1 hour prior to chemo. Do not rub in. Cover with plastic wrap. 09/08/13  Yes Farrel Gobble, MD  lisinopril (PRINIVIL,ZESTRIL) 40 MG tablet Take 40 mg by mouth daily with breakfast. 12/24/12  Yes Hillary Bow, MD  LORazepam (ATIVAN) 0.5 MG tablet Take one tablet every 4 hours as needed for nausea. 10/14/13  Yes Farrel Gobble, MD  metFORMIN (GLUCOPHAGE) 1000 MG tablet Take 1,000 mg by mouth 2 (two) times daily with a meal.    Yes Historical Provider, MD  metoCLOPramide (REGLAN) 10 MG tablet Take one or two tablets 4 times a day before meals and at bedtime as needed for nausea.  10/14/13  Yes Farrel Gobble, MD  pantoprazole (PROTONIX) 40 MG tablet Take 1 tablet (40 mg total) by mouth daily. 08/27/13  Yes Rogene Houston, MD  prochlorperazine (COMPAZINE) 10 MG tablet Take 1 tablet (10 mg total) by mouth every 6 (six) hours as needed for nausea or vomiting. 09/08/13  Yes Farrel Gobble, MD  traMADol (ULTRAM) 50 MG tablet Take 1 tablet (50 mg total) by mouth every 6 (six) hours as needed. 11/16/13  Yes [name], NP   Physical Exam: Filed Vitals:   12/30/13 1911  BP: 133/87  Pulse: 76  Temp: 98 F (36.7 C)  Resp: 18  BP 133/87  Pulse 76  Temp(Src) 98 F (36.7 C) (Oral)  Resp 18  Ht 5\' 11"  (1.803 m)  Wt 99.791 kg (220 lb)  BMI 30.70 kg/m2  SpO2 96%  General:  Appears calm and comfortable Eyes: PERRL, normal lids, irises & conjunctiva ENT: grossly normal hearing, lips & tongue Neck: no LAD, masses or thyromegaly Cardiovascular: RRR, no m/r/g. No LE edema. Telemetry: SR, no arrhythmias  Respiratory: CTA bilaterally, no w/r/r. Normal respiratory effort. Abdomen: soft, LLQ tenderness no rebound or guarding is noted Skin: no rash or induration seen on limited exam Musculoskeletal: grossly normal tone BUE/BLE Psychiatric: grossly normal mood and affect, speech fluent and appropriate Neurologic: grossly non-focal.          Labs on Admission:  Basic Metabolic Panel:  Recent Labs Lab 12/30/13 1720  NA 144  K 4.1  CL 104  CO2 26  GLUCOSE 162*  BUN 31*  CREATININE 1.53*  CALCIUM 9.2   Liver Function Tests:  Recent Labs Lab 12/30/13 1720  AST 25  ALT 20  ALKPHOS 49  BILITOT 0.7  PROT 6.9  ALBUMIN 4.1   No results found for this basename: LIPASE, AMYLASE,  in the last 168 hours No results found for this basename: AMMONIA,  in the last 168 hours CBC:  Recent Labs Lab 12/30/13 1720  WBC 2.8*  NEUTROABS 1.9  HGB 11.1*  HCT 32.7*  MCV 94.5  PLT 101*   Cardiac Enzymes: No results found for this basename: CKTOTAL, CKMB,  CKMBINDEX, TROPONINI,  in the last 168 hours  BNP (last 3 results) No results found for this basename: PROBNP,  in the last 8760 hours CBG: No results found for this basename: GLUCAP,  in the last 168 hours  Radiological Exams on Admission: No results found.  EKG: no recent ECG  Assessment/Plan Principal Problem:   Lower GI bleed Active Problems:   HYPERTENSION, BENIGN   Coronary atherosclerosis of native coronary artery   Mantle cell lymphoma   Diverticulitis   1. Lower GI bleed -will be admitted to telemetry -will monitor labs -likely related to diverticulitis will start on Unasyn -GI consult ordered  2. Colon Mantle Cell Lymphoma -had a recent scope done -nodularity had resolved on the recent scopt -GI follow up ordered  3. Hypertension -under control currently -continue with medications  4. CAD -currently stable -continue with current medications  5. Diverticulitis -started on Unasyn -monitor for further bleeding   Code Status:Full Code (must indicate code status--if unknown or must be presumed, indicate so) Family Communication: Wife in room (indicate person spoken with, if applicable, with phone number if by telephone) Disposition Plan: Home (indicate anticipated LOS)  Time spent: 9min  Briannia Laba A Kyrin Garn [facility] Pager [phone]

## 2013-12-31 ENCOUNTER — Encounter (HOSPITAL_COMMUNITY): Payer: Self-pay | Admitting: Radiology

## 2013-12-31 ENCOUNTER — Inpatient Hospital Stay (HOSPITAL_COMMUNITY): Payer: Medicare Other

## 2013-12-31 ENCOUNTER — Telehealth (HOSPITAL_COMMUNITY): Payer: Self-pay | Admitting: *Deleted

## 2013-12-31 DIAGNOSIS — K922 Gastrointestinal hemorrhage, unspecified: Secondary | ICD-10-CM | POA: Diagnosis not present

## 2013-12-31 DIAGNOSIS — D72819 Decreased white blood cell count, unspecified: Secondary | ICD-10-CM | POA: Diagnosis not present

## 2013-12-31 DIAGNOSIS — C8319 Mantle cell lymphoma, extranodal and solid organ sites: Secondary | ICD-10-CM

## 2013-12-31 DIAGNOSIS — K8020 Calculus of gallbladder without cholecystitis without obstruction: Secondary | ICD-10-CM | POA: Diagnosis not present

## 2013-12-31 DIAGNOSIS — R1032 Left lower quadrant pain: Secondary | ICD-10-CM | POA: Diagnosis not present

## 2013-12-31 DIAGNOSIS — I10 Essential (primary) hypertension: Secondary | ICD-10-CM

## 2013-12-31 LAB — COMPREHENSIVE METABOLIC PANEL
ALK PHOS: 40 U/L (ref 39–117)
ALT: 16 U/L (ref 0–53)
AST: 21 U/L (ref 0–37)
Albumin: 3.5 g/dL (ref 3.5–5.2)
BILIRUBIN TOTAL: 0.8 mg/dL (ref 0.3–1.2)
BUN: 24 mg/dL — AB (ref 6–23)
CHLORIDE: 106 meq/L (ref 96–112)
CO2: 27 mEq/L (ref 19–32)
Calcium: 8.5 mg/dL (ref 8.4–10.5)
Creatinine, Ser: 1.21 mg/dL (ref 0.50–1.35)
GFR, EST AFRICAN AMERICAN: 65 mL/min — AB (ref >90)
GFR, EST NON AFRICAN AMERICAN: 56 mL/min — AB (ref >90)
Glucose, Bld: 133 mg/dL — ABNORMAL HIGH (ref 70–99)
Potassium: 3.9 mEq/L (ref 3.7–5.3)
Sodium: 143 mEq/L (ref 137–147)
TOTAL PROTEIN: 6 g/dL (ref 6.0–8.3)

## 2013-12-31 LAB — GLUCOSE, CAPILLARY
GLUCOSE-CAPILLARY: 125 mg/dL — AB (ref 70–99)
Glucose-Capillary: 133 mg/dL — ABNORMAL HIGH (ref 70–99)

## 2013-12-31 LAB — CBC
HCT: 30 % — ABNORMAL LOW (ref 39.0–52.0)
Hemoglobin: 10.4 g/dL — ABNORMAL LOW (ref 13.0–17.0)
MCH: 32.5 pg (ref 26.0–34.0)
MCHC: 34.7 g/dL (ref 30.0–36.0)
MCV: 93.8 fL (ref 78.0–100.0)
PLATELETS: 103 10*3/uL — AB (ref 150–400)
RBC: 3.2 MIL/uL — ABNORMAL LOW (ref 4.22–5.81)
RDW: 14.9 % (ref 11.5–15.5)
WBC: 2.2 10*3/uL — AB (ref 4.0–10.5)

## 2013-12-31 LAB — PROTIME-INR
INR: 1.22 (ref 0.00–1.49)
Prothrombin Time: 15.1 seconds (ref 11.6–15.2)

## 2013-12-31 LAB — APTT: aPTT: 37 seconds (ref 24–37)

## 2013-12-31 MED ORDER — SODIUM CHLORIDE 0.9 % IV SOLN
INTRAVENOUS | Status: AC
  Filled 2013-12-31: qty 3

## 2013-12-31 MED ORDER — IOHEXOL 300 MG/ML  SOLN
50.0000 mL | Freq: Once | INTRAMUSCULAR | Status: AC | PRN
  Administered 2013-12-31: 50 mL via ORAL

## 2013-12-31 MED ORDER — IOHEXOL 300 MG/ML  SOLN
100.0000 mL | Freq: Once | INTRAMUSCULAR | Status: AC | PRN
  Administered 2013-12-31: 100 mL via INTRAVENOUS

## 2013-12-31 MED ORDER — METFORMIN HCL 500 MG PO TABS
1000.0000 mg | ORAL_TABLET | Freq: Two times a day (BID) | ORAL | Status: DC
  Filled 2013-12-31: qty 2

## 2013-12-31 MED ORDER — ATORVASTATIN CALCIUM 40 MG PO TABS: 40.0000 mg | ORAL_TABLET | Freq: Every morning | ORAL | Status: DC

## 2013-12-31 NOTE — Progress Notes (Signed)
UR chart review completed.  

## 2013-12-31 NOTE — Consult Note (Signed)
Reason for Consult:Lower GI bleed Referring Physician: Hospitalist  [name] is an 79 y.o. male.  HPI: Admitted thru the ED last night. Patient experienced rectal bleeding. He actually called our office and was advised to be evaluated in the ED. He has been lightheaded standing. No nausea or vomiting. He had a BM around 8am. He says he noticed blood in the commode. He flushed the commode and sat back down. The bleeding continued and it turned the water in the commode red. No further rectal bleeding (No BMs). Admission hemoglobin 11.1. Today hemoglobin 10.4. He has had a slight drop of about 1gm. He tells me he actually has had a small amount of rectal bleeding off and on for a year. He recently underwent a colonoscopy in March for staging  of his mantle cell lymphoma. He did have mild sigmoid colon diverticulosis. He is seen at the [facility]. There is slight left lower abdominal pain. The pain has been worse the past. There has been no fever. Hx of diverticulitis in the past. NO NSAIDs. Hx of Ulcerative colitis. Has been off medication for over 2 yrs. Family hx of colon cancer in a brother and sister.  12/17/2013 Colonoscopy:  Impression:  Examination performed to cecum.  Normal mucosa throughout; previously identified abnormality i.e. nodularity has resolved..  Random biopsies taken from ascending and transverse colon as well as from the descending colon(two containers).  Mild sigmoid colon diverticulosis.  Small external hemorrhoids and single anal papilla.        Past Medical History  Diagnosis Date  . Type 2 diabetes mellitus   . 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 Mixed hyperlipidemia   . PVD (peripheral vascular disease)   . Essential hypertension, benign   . Obstructive sleep apnea     On CPAP  . Prostate cancer   . Colon cancer     Past Surgical History   Procedure Laterality Date  . Tonsillectomy    . Back surgery    . Vasectomy    . Knee surgery      Right  . Shoulder surgery    . Prostatectomy    . Colonoscopy with esophagogastroduodenoscopy (egd) N/A 08/27/2013    Procedure: COLONOSCOPY WITH ESOPHAGOGASTRODUODENOSCOPY (EGD);  Surgeon: Rogene Houston, MD;  Location: [facility];  Service: Endoscopy;  Laterality: N/A;  730  . Balloon dilation N/A 08/27/2013    Procedure: BALLOON DILATION;  Surgeon: Rogene Houston, MD;  Location: [facility];  Service: Endoscopy;  Laterality: N/A;  Venia Minks dilation N/A 08/27/2013    Procedure: Venia Minks DILATION;  Surgeon: Rogene Houston, MD;  Location: [facility];  Service: Endoscopy;  Laterality: N/A;  . Savory dilation N/A 08/27/2013    Procedure: SAVORY DILATION;  Surgeon: Rogene Houston, MD;  Location: [facility];  Service: Endoscopy;  Laterality: N/A;  . Portacath placement Right 2014  . Colonoscopy N/A 12/17/2013    Procedure: COLONOSCOPY;  Surgeon: Rogene Houston, MD;  Location: [facility];  Service: Endoscopy;  Laterality: N/A;  940  . Colon surgery      Family History  Problem Relation Age of Onset  . Colon cancer Brother   . Cancer Brother   . Diabetes Father   . Cancer Brother     Social History:  reports that he has never smoked. He has never used smokeless  tobacco. He reports that he does not drink alcohol or use illicit drugs.  Allergies:  Allergies  Allergen Reactions  . Bee Venom Anaphylaxis  . Adhesive [Tape] Hives  . Ciprofloxacin     Unknown   . Codeine     Unknown    . Iodine     Unknown    . Latex     Unknown    . Povidone-Iodine     Unknown    . Simvastatin     Unknown      Medications: I have reviewed the patient's current medications.  Results for orders placed during the hospital encounter of 12/30/13 (from the past 48 hour(s))  CBC WITH DIFFERENTIAL     Status: Abnormal   Collection Time    12/30/13  5:20 PM      Result Value Ref  Range   WBC 2.8 (*) 4.0 - 10.5 K/uL   RBC 3.46 (*) 4.22 - 5.81 MIL/uL   Hemoglobin 11.1 (*) 13.0 - 17.0 g/dL   HCT 32.7 (*) 39.0 - 52.0 %   MCV 94.5  78.0 - 100.0 fL   MCH 32.1  26.0 - 34.0 pg   MCHC 33.9  30.0 - 36.0 g/dL   RDW 15.2  11.5 - 15.5 %   Platelets 101 (*) 150 - 400 K/uL   Neutrophils Relative % 68  43 - 77 %   Lymphocytes Relative 18  12 - 46 %   Monocytes Relative 11  3 - 12 %   Eosinophils Relative 3  0 - 5 %   Basophils Relative 0  0 - 1 %   Neutro Abs 1.9  1.7 - 7.7 K/uL   Lymphs Abs 0.5 (*) 0.7 - 4.0 K/uL   Monocytes Absolute 0.3  0.1 - 1.0 K/uL   Eosinophils Absolute 0.1  0.0 - 0.7 K/uL   Basophils Absolute 0.0  0.0 - 0.1 K/uL   Smear Review PLATELETS APPEAR DECREASED     Comment: PLATELET COUNT CONFIRMED BY SMEAR  COMPREHENSIVE METABOLIC PANEL     Status: Abnormal   Collection Time    12/30/13  5:20 PM      Result Value Ref Range   Sodium 144  137 - 147 mEq/L   Potassium 4.1  3.7 - 5.3 mEq/L   Chloride 104  96 - 112 mEq/L   CO2 26  19 - 32 mEq/L   Glucose, Bld 162 (*) 70 - 99 mg/dL   BUN 31 (*) 6 - 23 mg/dL   Creatinine, Ser 1.53 (*) 0.50 - 1.35 mg/dL   Calcium 9.2  8.4 - 10.5 mg/dL   Total Protein 6.9  6.0 - 8.3 g/dL   Albumin 4.1  3.5 - 5.2 g/dL   AST 25  0 - 37 U/L   ALT 20  0 - 53 U/L   Alkaline Phosphatase 49  39 - 117 U/L   Total Bilirubin 0.7  0.3 - 1.2 mg/dL   GFR calc non Af Amer 42 (*) >90 mL/min   GFR calc Af Amer 49 (*) >90 mL/min   Comment: (NOTE)     The eGFR has been calculated using the CKD EPI equation.     This calculation has not been validated in all clinical situations.     eGFR's persistently <90 mL/min signify possible Chronic Kidney     Disease.  PROTIME-INR     Status: None   Collection Time    12/30/13  5:20 PM  Result Value Ref Range   Prothrombin Time 13.8  11.6 - 15.2 seconds   INR 1.08  0.00 - 1.49  APTT     Status: None   Collection Time    12/30/13  5:20 PM      Result Value Ref Range   aPTT 36  24 - 37  seconds  TYPE AND SCREEN     Status: None   Collection Time    12/30/13  5:21 PM      Result Value Ref Range   ABO/RH(D) A POS     Antibody Screen NEG     Sample Expiration 01/02/2014    COMPREHENSIVE METABOLIC PANEL     Status: Abnormal   Collection Time    12/31/13  4:58 AM      Result Value Ref Range   Sodium 143  137 - 147 mEq/L   Potassium 3.9  3.7 - 5.3 mEq/L   Chloride 106  96 - 112 mEq/L   CO2 27  19 - 32 mEq/L   Glucose, Bld 133 (*) 70 - 99 mg/dL   BUN 24 (*) 6 - 23 mg/dL   Creatinine, Ser 1.21  0.50 - 1.35 mg/dL   Calcium 8.5  8.4 - 10.5 mg/dL   Total Protein 6.0  6.0 - 8.3 g/dL   Albumin 3.5  3.5 - 5.2 g/dL   AST 21  0 - 37 U/L   ALT 16  0 - 53 U/L   Alkaline Phosphatase 40  39 - 117 U/L   Total Bilirubin 0.8  0.3 - 1.2 mg/dL   GFR calc non Af Amer 56 (*) >90 mL/min   GFR calc Af Amer 65 (*) >90 mL/min   Comment: (NOTE)     The eGFR has been calculated using the CKD EPI equation.     This calculation has not been validated in all clinical situations.     eGFR's persistently <90 mL/min signify possible Chronic Kidney     Disease.  CBC     Status: Abnormal   Collection Time    12/31/13  4:58 AM      Result Value Ref Range   WBC 2.2 (*) 4.0 - 10.5 K/uL   RBC 3.20 (*) 4.22 - 5.81 MIL/uL   Hemoglobin 10.4 (*) 13.0 - 17.0 g/dL   HCT 30.0 (*) 39.0 - 52.0 %   MCV 93.8  78.0 - 100.0 fL   MCH 32.5  26.0 - 34.0 pg   MCHC 34.7  30.0 - 36.0 g/dL   RDW 14.9  11.5 - 15.5 %   Platelets 103 (*) 150 - 400 K/uL   Comment: CONSISTENT WITH PREVIOUS RESULT  PROTIME-INR     Status: None   Collection Time    12/31/13  4:58 AM      Result Value Ref Range   Prothrombin Time 15.1  11.6 - 15.2 seconds   INR 1.22  0.00 - 1.49  APTT     Status: None   Collection Time    12/31/13  4:58 AM      Result Value Ref Range   aPTT 37  24 - 37 seconds   Comment:            IF BASELINE aPTT IS ELEVATED,     SUGGEST PATIENT RISK ASSESSMENT     BE USED TO DETERMINE APPROPRIATE      ANTICOAGULANT THERAPY.    No results found.  ROS Blood pressure 134/80, pulse 69, temperature 98.4 F (  36.9 C), temperature source Oral, resp. rate 18, height _0  (1.803 m), weight 220 lb (99.791 kg), SpO2 98.00%. Physical Exam Alert and oriented. Skin warm and dry. Oral mucosa is moist.   . Sclera anicteric, conjunctivae is pink. Thyroid not enlarged. No cervical lymphadenopathy. Lungs clear. Heart regular rate and rhythm.  Abdomen is soft. Bowel sounds are positive. No hepatomegaly. No abdominal masses felt.Tenderness left lower quadrant.  No edema to lower extremities. Assessment/Plan: Lower GI bleed. Probable diverticular. Does have some left lower quadrant tenderness. Possible diverticulitis. Plan; Will monitor for now. Slight drop in his hemoglobin.  CT scan pending. Will discuss with Dr. Cherylynn Ridges 12/31/2013, 8:19 AM    GI attending note; Patient interviewed and examined. Abdominopelvic CT from this morning reviewed. Patient is well-known to me from previous evaluations including colonoscopy 2 weeks to restage mantle cell lymphoma. He presents with large volume painless hematochezia. He has stopped passing blood per rectum. Suspect colonic diverticula bleed. His hemoglobin has dropped by about 2 g compared to his hemoglobin on 12/06/2013. H&H will be repeated in a.m. He has mild tenderness and LLQ which is chronic. He does not complain of abdominal pain. Therefore will stop his antibiotic therapy. Neutropenia with thrombocytopenia most likely secondary to chemotherapy. CBC with differential will be repeated in a.m. I have talked with Dr.Formanek,s nurse Ms. Kurtis Bushman and they possibly will delay chemotherapy which is scheduled for 01/03/2014. Will advance diet. Unless he has recurrent bleed he should be able to go home in a.m.[name]

## 2013-12-31 NOTE — Discharge Summary (Addendum)
Physician Discharge Summary  [name] GGE:[id number] DOB: [birth year] DOA: 12/30/2013  PCP: [name], MD  Admit date: 12/30/2013 Discharge date: 01/01/2014  Recommendations for Outpatient Follow-up:  1. Follow up with oncology at appointments determined by oncology.  Patient may actually be stable for chemotherapy on Monday if Oncology agrees.   2. Gastroenterology follow up as needed  Discharge Diagnoses:  Principal Problem:   Diverticulosis of colon with hemorrhage Active Problems:   HYPERTENSION, BENIGN   Coronary atherosclerosis of native coronary artery   Mantle cell lymphoma   Abdominal pain, left lower quadrant   Lower GI bleed   Leukopenia   Thrombocytopenia, unspecified   Acute blood loss anemia   Discharge Condition: stable, improved  Diet recommendation: diabetic  Wt Readings from Last 3 Encounters:  01/01/14 97.3 kg (214 lb 8.1 oz)  12/06/13 97.796 kg (215 lb 9.6 oz)  11/16/13 97.387 kg (214 lb 11.2 oz)    History of present illness:  [name] is a 79 y.o. male presents to the ED with a rectal bleed. Patient states that he has had ongoing bleeding for but of late it has increased in amount. He recently had a colonscopy done which was essentially negative. He does have ahistory of Mantle Cell Lymphoma of the colon. He also has a history of diverticulitis. Patient states that he has been feeling somewhat dizzy and felt as though he was going to pass out. He did not actually have syncope though. Denies having any chest pain presently no diaphoresis presently. He states that he has had pain in his abdomen mainly in the LLQ. He states that he has not had any radiation of the pain. There is no vomiting of blood. He has no nausea.      Hospital Course:   Likely diverticular bleed with decrease in hemoglobin from 11.4 to 10.4.  He was seen by gastroenterology who recommended observation for further bleeding.  He had no further episodes of blood in his  stool.  His hemoglobin on the day of discharge was 11.2.  He should avoid NSAIDS which may increase his risk of bleeding.  Mantle cell lymphoma, undergoing chemotherapy with good response.  Dr. Laural Golden spoke with oncology and recommended deferring next chemotherapy initially due to acute illness, but at that time there was concern for diverticulitis.  As his CT scan was negative for diverticulitis and his pain is chronic and stable, I think the patient is probably stable for chemotherapy whenever his next treatment is due.    Hypertension, well controlled on norvasc, lisinopril.  T2DM, stable.  Continued metformin and lantus  Leukopenia, thrombocytopenia (and possibly some component of his anemia) likely due to recent chemotherapy, although GIB can also cause thrombocytopenia.   -  Repeat CBC by oncology at next appointment  OSA, stable on CPAP  Chronic LLQ pain, stable.  CT abd/pelvis without evidence of diverticulitis.  He was initially started on antibiotics, however, these were discontinued after results of CT scan reported.    Consultants:  GI Procedures:  CT abd and pelvis Antibiotics:  unasyn 4.9.2015 > 4/10  Discharge Exam: Filed Vitals:   01/01/14 0538  BP: 121/65  Pulse: 66  Temp: 98.5 F (36.9 C)  Resp: 20   Filed Vitals:   12/30/13 1911 12/31/13 0512 12/31/13 2222 01/01/14 0538  BP: 133/87 134/80 111/71 121/65  Pulse: 76 69 60 66  Temp: 98 F (36.7 C) 98.4 F (36.9 C) 97.8 F (36.6 C) 98.5 F (36.9 C)  TempSrc: Oral Oral Oral Oral  Resp: 18 18 20 20   Height:      Weight:    97.3 kg (214 lb 8.1 oz)  SpO2: 96% 98% 98% 94%    General: CM, NAD HEENT:  NCAT, MMM Cardiovascular: RRR, no mrg, 2+ pulses Respiratory: CTAB ABD:  NABS, soft, ND, mild TTP to the left of epigastrium and in LLQ MSK:  No LEE, normal tone and bulk  Discharge Instructions      Discharge Orders   Future Appointments Provider Department Dept Phone   01/10/2014 9:15 AM [place] [phone]   01/10/2014 2:30 PM [place], PA-C [place] [phone]   01/11/2014 9:15 AM [facility] [place] [phone]   01/13/2014 1:00 PM [place] [phone]   Future Orders Complete By Expires   Call MD for:  difficulty breathing, headache or visual disturbances  As directed    Call MD for:  extreme fatigue  As directed    Call MD for:  hives  As directed    Call MD for:  persistant dizziness or light-headedness  As directed    Call MD for:  persistant nausea and vomiting  As directed    Call MD for:  severe uncontrolled pain  As directed    Call MD for:  temperature >100.4  As directed    Diet - low sodium heart healthy  As directed    Discharge instructions  As directed    Increase activity slowly  As directed        Medication List         acetaminophen 325 MG tablet  Commonly known as:  TYLENOL  Take 650 mg by mouth every 4 (four) hours as needed for mild pain or moderate pain. Take 2 tablets 1 hour prior to Rituxan treatment.     amLODipine 5 MG tablet  Commonly known as:  NORVASC  Take 5 mg by mouth daily with breakfast.     atorvastatin 40 MG tablet  Commonly known as:  LIPITOR  Take 1 tablet (40 mg total) by mouth every morning.     diazepam 5 MG tablet  Commonly known as:  VALIUM  Take 5 mg by mouth every 6 (six) hours as needed (sleep). Take one tablet at bedtime if needed for sleep.     diphenhydrAMINE 25 MG tablet  Commonly known as:  BENADRYL  Take 25 mg by mouth daily as needed. Take 2 tablets 1 hour prior to Rituxan treatment.     escitalopram 10 MG tablet  Commonly known as:  LEXAPRO  Take 10 mg by mouth every morning.     LANTUS SOLOSTAR 100 UNIT/ML injection  Generic drug:  insulin glargine  Inject 50 Units into the skin every morning.     lidocaine-prilocaine cream  Commonly known as:  EMLA  Apply 1 application topically daily as needed  (chemo). Apply a quarter size amount to port site 1 hour prior to chemo. Do not rub in. Cover with plastic wrap.     lisinopril 40 MG tablet  Commonly known as:  PRINIVIL,ZESTRIL  Take 40 mg by mouth daily with breakfast.     LORazepam 0.5 MG tablet  Commonly known as:  ATIVAN  Take one tablet every 4 hours as needed for nausea.     metFORMIN 1000 MG tablet  Commonly known as:  GLUCOPHAGE  Take 1,000 mg by  mouth 2 (two) times daily with a meal.     metoCLOPramide 10 MG tablet  Commonly known as:  REGLAN  Take one or two tablets 4 times a day before meals and at bedtime as needed for nausea.     pantoprazole 40 MG tablet  Commonly known as:  PROTONIX  Take 1 tablet (40 mg total) by mouth daily.     prochlorperazine 10 MG tablet  Commonly known as:  COMPAZINE  Take 1 tablet (10 mg total) by mouth every 6 (six) hours as needed for nausea or vomiting.     traMADol 50 MG tablet  Commonly known as:  ULTRAM  Take 1 tablet (50 mg total) by mouth every 6 (six) hours as needed.     VITAMIN B-12 IJ  Inject as directed every 30 (thirty) days.       Follow-up Information   Follow up with [name], MD. Schedule an appointment as soon as possible for a visit in 1 month.   Specialty:  Internal Medicine   Contact information:    [place] [address] [phone]       Schedule an appointment as soon as possible for a visit with REHMAN,NAJEEB U, MD. (As needed, If symptoms worsen)    Specialty:  Gastroenterology   Contact information:   [address], [address] [place] [address] [phone]        The results of significant diagnostics from this hospitalization (including imaging, microbiology, ancillary and laboratory) are listed below for reference.    Significant Diagnostic Studies: Ct Abdomen Pelvis W Contrast  12/31/2013   CLINICAL DATA:  Left lower quadrant abdominal pain with rectal bleeding.  EXAM: CT ABDOMEN AND PELVIS WITH CONTRAST  TECHNIQUE:  Multidetector CT imaging of the abdomen and pelvis was performed using the standard protocol following bolus administration of intravenous contrast.  CONTRAST:  71m OMNIPAQUE IOHEXOL 300 MG/ML SOLN, 1071mOMNIPAQUE IOHEXOL 300 MG/ML SOLN  COMPARISON:  CT ABD - PELV W/ CM dated 11/16/2013; CT ABD - PELV W/ CM dated 09/06/2013  FINDINGS: Small right middle lobe ground-glass density on image 3 is stable. The lung bases are otherwise clear. There is no significant pleural or pericardial effusion. Lipoma in the lower right chest wall is stable.  The liver and spleen appear unremarkable. There are small calcified gallstones within the gallbladder. Atrophy of the pancreatic tail is stable. There is no pancreatic or biliary ductal dilatation.  There is no adrenal mass. Bilateral renal cystic lesions are stable.  Sigmoid colon diverticular changes are present without adjacent inflammation. Distal colonic wall thickening is improved. The small bowel appears normal. The stomach is poorly distended. There is no evidence of bowel obstruction. Aortoiliac atherosclerosis appears stable. There is no adenopathy.  Reservoir for penile prosthesis in the left lower quadrant appears unchanged. The prostate gland is surgically absent. The bladder is low lying.  There are no worrisome osseous findings. Degenerative disc disease in the lower lumbar spine and a bone island in the right iliac bone are stable.  IMPRESSION: 1. No acute findings or explanation for left lower quadrant abdominal pain identified. 2. Sigmoid colon diverticulosis without evidence of acute inflammation. 3. Bladder prolapse status post prostatectomy. 4. Stable cholelithiasis, atherosclerosis, renal cysts and pancreatic atrophy.   Electronically Signed   By: BiCamie Patience.D.   On: 12/31/2013 13:12    Microbiology: No results found for this or any previous visit (from the past 240 hour(s)).  Labs: Basic Metabolic Panel:  Recent Labs Lab 12/30/13 1720  12/31/13 0458  NA 144 143  K 4.1 3.9  CL 104 106  CO2 26 27  GLUCOSE 162* 133*  BUN 31* 24*  CREATININE 1.53* 1.21  CALCIUM 9.2 8.5   Liver Function Tests:  Recent Labs Lab 12/30/13 1720 12/31/13 0458  AST 25 21  ALT 20 16  ALKPHOS 49 40  BILITOT 0.7 0.8  PROT 6.9 6.0  ALBUMIN 4.1 3.5   No results found for this basename: LIPASE, AMYLASE,  in the last 168 hours No results found for this basename: AMMONIA,  in the last 168 hours CBC:  Recent Labs Lab 12/30/13 1720 12/31/13 0458 01/01/14 0552  WBC 2.8* 2.2* 2.5*  NEUTROABS 1.9  --  1.6*  HGB 11.1* 10.4* 11.2*  HCT 32.7* 30.0* 31.8*  MCV 94.5 93.8 93.3  PLT 101* 103* 99*   Cardiac Enzymes: No results found for this basename: CKTOTAL, CKMB, CKMBINDEX, TROPONINI,  in the last 168 hours BNP: BNP (last 3 results) No results found for this basename: PROBNP,  in the last 8760 hours CBG:  Recent Labs Lab 12/31/13 0755 12/31/13 1204 01/01/14 0729  GLUCAP 133* 125* 88    Time coordinating discharge: 35 minutes  Signed:  Janece Canterbury  [facility] 01/01/2014, 9:52 AM

## 2013-12-31 NOTE — Telephone Encounter (Signed)
Dr. Laural Golden called to let us know that patient was admitted for diverticulitis and GI bleed. He will go home tomorrow. He will be on "treatment" for this for one week per Dr. Laural Golden. Dr. Laural Golden wanted to let us know so that we could talk with you to see if you would want to defer rituxan/bendamustine 1 week. Patient was due chemo  on Monday.

## 2013-12-31 NOTE — Progress Notes (Signed)
[facility] PROGRESS NOTE  Assessment/Plan: Lower GI bleed: - Mild drop in Hbg from 11.4-> 10.4 - GI consult pending. - asx.  Colon Mantle Cell Lymphoma: - had a recent scope done  - nodularity had resolved on the recent scopt   Hypertension: - under control currently  - continue with medications   LLQ: - Started on Unasyn for possible diverticulitis.  - pain improved. Clear liw diet. Recent colonoscopy 3.27.2015 showed diverticulosis.  Leukopenia: - most likely due to chemotherapy  Code Status:Full Code  Family Communication: Wife in room Disposition Plan: Home     Consultants:  GI  Procedures:  CT abd and pelvis  Antibiotics:  unasyn 4.9.2015  HPI/Subjective: Relates some abdominal discomfort, 1 BM in house.  Objective: Filed Vitals:   12/30/13 1816 12/30/13 1818 12/30/13 1911 12/31/13 0512  BP: 126/75 127/73 133/87 134/80  Pulse: 75 79 76 69  Temp:   98 F (36.7 C) 98.4 F (36.9 C)  TempSrc:   Oral Oral  Resp:   18 18  Height:      Weight:      SpO2:   96% 98%    Intake/Output Summary (Last 24 hours) at 12/31/13 0748 Last data filed at 12/30/13 1914  Gross per 24 hour  Intake      0 ml  Output    225 ml  Net   -225 ml   Filed Weights   12/30/13 1658  Weight: 99.791 kg (220 lb)    Exam:  General: Alert, awake, oriented x3, in no acute distress.  HEENT: No bruits, no goiter.  Heart: Regular rate and rhythm, without murmurs, rubs, gallops.  Lungs: Good air movement, bilateral air movement.  Abdomen: Soft, nontender, LLQ tenderness no rebound Neuro: Grossly intact, nonfocal.   Data Reviewed: Basic Metabolic Panel:  Recent Labs Lab 12/30/13 1720 12/31/13 0458  NA 144 143  K 4.1 3.9  CL 104 106  CO2 26 27  GLUCOSE 162* 133*  BUN 31* 24*  CREATININE 1.53* 1.21  CALCIUM 9.2 8.5   Liver Function Tests:  Recent Labs Lab 12/30/13 1720 12/31/13 0458  AST 25 21  ALT 20 16  ALKPHOS 49 40  BILITOT 0.7 0.8  PROT  6.9 6.0  ALBUMIN 4.1 3.5   No results found for this basename: LIPASE, AMYLASE,  in the last 168 hours No results found for this basename: AMMONIA,  in the last 168 hours CBC:  Recent Labs Lab 12/30/13 1720 12/31/13 0458  WBC 2.8* 2.2*  NEUTROABS 1.9  --   HGB 11.1* 10.4*  HCT 32.7* 30.0*  MCV 94.5 93.8  PLT 101* 103*   Cardiac Enzymes: No results found for this basename: CKTOTAL, CKMB, CKMBINDEX, TROPONINI,  in the last 168 hours BNP (last 3 results) No results found for this basename: PROBNP,  in the last 8760 hours CBG: No results found for this basename: GLUCAP,  in the last 168 hours  No results found for this or any previous visit (from the past 240 hour(s)).   Studies: No results found.  Scheduled Meds: . amLODipine  5 mg Oral Q breakfast  . ampicillin-sulbactam (UNASYN) IV  3 g Intravenous Q6H  . atorvastatin  40 mg Oral q1800  . escitalopram  10 mg Oral Daily  . folic acid  1 mg Oral Daily  . insulin glargine  50 Units Subcutaneous Daily  . lisinopril  40 mg Oral Q breakfast  . metFORMIN  1,000 mg Oral BID WC  .  metoCLOPramide  10 mg Oral TID AC & HS  . pantoprazole  40 mg Oral Daily  . sodium chloride  3 mL Intravenous Q12H  . thiamine  100 mg Oral Daily   Continuous Infusions: . sodium chloride 75 mL/hr at 12/30/13 [facility] Pager [phone]. If 8PM-8AM, please contact night-coverage at [email or web address], password [facility] 12/31/2013, 7:48 AM  LOS: 1 day

## 2014-01-01 DIAGNOSIS — D62 Acute posthemorrhagic anemia: Secondary | ICD-10-CM | POA: Diagnosis not present

## 2014-01-01 DIAGNOSIS — K5731 Diverticulosis of large intestine without perforation or abscess with bleeding: Secondary | ICD-10-CM | POA: Diagnosis not present

## 2014-01-01 DIAGNOSIS — D72819 Decreased white blood cell count, unspecified: Secondary | ICD-10-CM | POA: Diagnosis not present

## 2014-01-01 DIAGNOSIS — R1032 Left lower quadrant pain: Secondary | ICD-10-CM | POA: Diagnosis not present

## 2014-01-01 DIAGNOSIS — D696 Thrombocytopenia, unspecified: Secondary | ICD-10-CM

## 2014-01-01 LAB — CBC WITH DIFFERENTIAL/PLATELET
Basophils Absolute: 0 10*3/uL (ref 0.0–0.1)
Basophils Relative: 1 % (ref 0–1)
Eosinophils Absolute: 0.1 10*3/uL (ref 0.0–0.7)
Eosinophils Relative: 3 % (ref 0–5)
HCT: 31.8 % — ABNORMAL LOW (ref 39.0–52.0)
Hemoglobin: 11.2 g/dL — ABNORMAL LOW (ref 13.0–17.0)
LYMPHS ABS: 0.5 10*3/uL — AB (ref 0.7–4.0)
LYMPHS PCT: 19 % (ref 12–46)
MCH: 32.8 pg (ref 26.0–34.0)
MCHC: 35.2 g/dL (ref 30.0–36.0)
MCV: 93.3 fL (ref 78.0–100.0)
Monocytes Absolute: 0.3 10*3/uL (ref 0.1–1.0)
Monocytes Relative: 12 % (ref 3–12)
NEUTROS ABS: 1.6 10*3/uL — AB (ref 1.7–7.7)
Neutrophils Relative %: 65 % (ref 43–77)
PLATELETS: 99 10*3/uL — AB (ref 150–400)
RBC: 3.41 MIL/uL — AB (ref 4.22–5.81)
RDW: 14.6 % (ref 11.5–15.5)
WBC: 2.5 10*3/uL — AB (ref 4.0–10.5)

## 2014-01-01 LAB — GLUCOSE, CAPILLARY: GLUCOSE-CAPILLARY: 88 mg/dL (ref 70–99)

## 2014-01-01 NOTE — Progress Notes (Signed)
Patient discharged with instructions, prescriptions, and carenotes. Pt verbalized understanding via teach back method.  The patient left the floor with staff via w/c in stable condition.

## 2014-01-03 ENCOUNTER — Inpatient Hospital Stay (HOSPITAL_COMMUNITY): Payer: Medicare Other

## 2014-01-03 ENCOUNTER — Ambulatory Visit (HOSPITAL_COMMUNITY): Payer: Medicare Other | Admitting: Oncology

## 2014-01-04 ENCOUNTER — Ambulatory Visit (HOSPITAL_COMMUNITY): Payer: Medicare Other

## 2014-01-04 ENCOUNTER — Other Ambulatory Visit (HOSPITAL_COMMUNITY): Payer: Medicare Other

## 2014-01-04 ENCOUNTER — Inpatient Hospital Stay (HOSPITAL_COMMUNITY): Payer: Medicare Other

## 2014-01-06 ENCOUNTER — Ambulatory Visit (HOSPITAL_COMMUNITY): Payer: Medicare Other

## 2014-01-09 NOTE — Progress Notes (Signed)
Brandon Cahill, MD  [place] [address]  Mantle cell lymphoma - Plan: CBC with Differential, Comprehensive metabolic panel, Lactate dehydrogenase, Beta 2 microglobuline, serum  CURRENT THERAPY: Bendamustine/Rituxan with Neulasta support  INTERVAL HISTORY: [name] 79 y.o. male returns for  regular  visit for followup of mantle cell lymphoma of the colon receiving bendamustine/Rituxan followed by Neulasta support.    Mantle cell lymphoma   08/27/2013 Initial Diagnosis Colon, biopsy, random - MANTLE CELL LYMPHOMA   09/06/2013 Imaging CT CAP- No significant lymphadenopathy identified within the chest, abdomen or pelvis.   09/14/2013 -  Chemotherapy Bendamustine/Rituxan x 6 cycles    I personally reviewed and went over laboratory results with the patient.  The results are noted within this dictation.  I personally reviewed and went over radiographic studies with the patient.  The results are noted within this dictation.    [name] underwent a repeat colonoscopy by Dr. Laural Golden on 12/17/13 to evaluate response to therapy which demonstrated the following: Examination performed to cecum.  Normal mucosa throughout; previously identified abnormality i.e. nodularity has resolved..  Random biopsies taken from ascending and transverse colon as well as from the descending colon(two containers).  Mild sigmoid colon diverticulosis.  Small external hemorrhoids and single anal papilla.  Pathology from these biopsies are reviewed.  I personally reviewed and went over pathology results with the patient.  Pathology demonstrated minimal residual mantle cell lymphoma.  [name] was admitted to the [facility] on 12/30/2013 and discharged on 4/1//2015 for rectal bleeding.  He denies any oncology complaints today and ROS questioning is negative. His rectal bleeding has resolved.      Past Medical History  Diagnosis Date  . Type 2 diabetes mellitus   . Coronary  atherosclerosis of native coronary artery     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, anomalous circumflex - no PCIs  . Mixed hyperlipidemia   . PVD (peripheral vascular disease)   . Essential hypertension, benign   . Obstructive sleep apnea     On CPAP  . Prostate cancer   . Colon cancer     has Mixed hyperlipidemia; HYPERTENSION, BENIGN; Coronary atherosclerosis of native coronary artery; VASOMOTOR RHINITIS; GERD; OSA (obstructive sleep apnea); H/O ulcerative colitis; Mantle cell lymphoma; Prostate cancer; Proctitis, radiation; Abdominal pain, left lower quadrant; Abdominal pain, unspecified site; Lower GI bleed; Diverticulitis; Leukopenia; Diverticulosis of colon with hemorrhage; Thrombocytopenia, unspecified; and Acute blood loss anemia on his problem list.     is allergic to bee venom; adhesive; ciprofloxacin; codeine; iodine; latex; povidone-iodine; and simvastatin.  [name] does not currently have medications on file.  Past Surgical History  Procedure Laterality Date  . Tonsillectomy    . Back surgery    . Vasectomy    . Knee surgery      Right  . Shoulder surgery    . Prostatectomy    . Colonoscopy with esophagogastroduodenoscopy (egd) N/A 08/27/2013    Procedure: COLONOSCOPY WITH ESOPHAGOGASTRODUODENOSCOPY (EGD);  Surgeon: Rogene Houston, MD;  [facility];  Service: Endoscopy;  Laterality: N/A;  730  . Balloon dilation N/A 08/27/2013    Procedure: BALLOON DILATION;  Surgeon: Rogene Houston, MD;  [facility];  Service: Endoscopy;  Laterality: N/A;  Venia Minks dilation N/A 08/27/2013    Procedure: Venia Minks DILATION;  Surgeon: Rogene Houston, MD;  [facility];  Service: Endoscopy;  Laterality: N/A;  . Savory dilation N/A 08/27/2013  Procedure: SAVORY DILATION;  Surgeon: Rogene Houston, MD;  [facility];  Service: Endoscopy;  Laterality: N/A;  . Portacath placement Right 2014  . Colonoscopy N/A 12/17/2013    Procedure:  COLONOSCOPY;  Surgeon: Rogene Houston, MD;  [facility];  Service: Endoscopy;  Laterality: N/A;  940  . Colon surgery      Denies any headaches, dizziness, double vision, fevers, chills, night sweats, nausea, vomiting, diarrhea, constipation, chest pain, heart palpitations, shortness of breath, blood in stool, black tarry stool, urinary pain, urinary burning, urinary frequency, hematuria.   PHYSICAL EXAMINATION  ECOG PERFORMANCE STATUS: 0 - Asymptomatic  There were no vitals filed for this visit.  GENERAL:alert, no distress, well nourished, well developed, comfortable, cooperative and smiling SKIN: skin color, texture, turgor are normal, no rashes or significant lesions HEAD: Normocephalic, No masses, lesions, tenderness or abnormalities EYES: normal, PERRLA, EOMI, Conjunctiva are pink and non-injected EARS: External ears normal OROPHARYNX:mucous membranes are moist  NECK: supple, trachea midline LYMPH:  not examined BREAST:not examined LUNGS: not examined HEART: not examined ABDOMEN:obese BACK: Back symmetric, no curvature. EXTREMITIES:no clubbing, no cyanosis  NEURO: alert & oriented x 3 with fluent speech, no focal motor/sensory deficits, gait normal    LABORATORY DATA: CBC    Component Value Date/Time   WBC 2.5* 01/01/2014 0552   RBC 3.41* 01/01/2014 0552   RBC 5.02 09/14/2013 0831   HGB 11.2* 01/01/2014 0552   HCT 31.8* 01/01/2014 0552   PLT 99* 01/01/2014 0552   MCV 93.3 01/01/2014 0552   MCH 32.8 01/01/2014 0552   MCHC 35.2 01/01/2014 0552   RDW 14.6 01/01/2014 0552   LYMPHSABS 0.5* 01/01/2014 0552   MONOABS 0.3 01/01/2014 0552   EOSABS 0.1 01/01/2014 0552   BASOSABS 0.0 01/01/2014 0552      Chemistry      Component Value Date/Time   NA 143 12/31/2013 0458   K 3.9 12/31/2013 0458   CL 106 12/31/2013 0458   CO2 27 12/31/2013 0458   BUN 24* 12/31/2013 0458   CREATININE 1.21 12/31/2013 0458      Component Value Date/Time   CALCIUM 8.5 12/31/2013 0458    ALKPHOS 40 12/31/2013 0458   AST 21 12/31/2013 0458   ALT 16 12/31/2013 0458   BILITOT 0.8 12/31/2013 0458       ASSESSMENT:  1.Stage I-E. mantle cell lymphoma involving the colon 2. Ulcerative colitis, improved.  3. Depression and anxiety disorder, much improved.  4. Past history of prostate cancer  5. Tremors associated with Compazine.  6. Thrombocytopenia secondary to previous chemotherapy  Patient Active Problem List   Diagnosis Date Noted  . Diverticulosis of colon with hemorrhage 01/01/2014  . Thrombocytopenia, unspecified 01/01/2014  . Acute blood loss anemia 01/01/2014  . Leukopenia 12/31/2013  . Lower GI bleed 12/30/2013  . Diverticulitis 12/30/2013  . Abdominal pain, left lower quadrant 11/16/2013  . Abdominal pain, unspecified site 11/16/2013  . Mantle cell lymphoma 09/07/2013  . Prostate cancer 09/07/2013  . Proctitis, 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  . H/O ulcerative colitis 04/12/2013  . OSA (obstructive sleep apnea) 08/15/2012  . VASOMOTOR RHINITIS 01/23/2010  . GERD 01/23/2010  . Mixed hyperlipidemia 11/23/2009  . HYPERTENSION, BENIGN 11/23/2009  . Coronary atherosclerosis of native coronary artery 02/18/2009     PLAN:  1. I personally reviewed and went over laboratory results with the patient.  The results are noted within this dictation. 2. I personally reviewed and went over radiographic studies with the patient.  The  results are noted within this dictation.   3. I personally reviewed and went over pathology results with the patient. 4. Chart reviewed 5. Chemotherapy deferred x 7 days due to hospitalization 6. Treatment plan updated to reflect #5. 7. Pre-chemo labs: CBC diff, CMET, LDH, B2M 8. Chemotherapy today as planned if treatment parameters met. 9. Return in 4 weeks for follow-up   THERAPY PLAN:  We will plan on administering 6 cycles of Bendamustine/Rituxan.  He will undergo colonoscopy following cycle 6 of chemotherapy.  Depending on this test we will  develop further therapy plans if needed.    All questions were answered. The patient knows to call the clinic with any problems, questions or concerns. We can certainly see the patient much sooner if necessary.  Patient and plan discussed with Dr. Farrel Gobble and he is in agreement with the aforementioned.   Baird Cancer 01/10/2014

## 2014-01-10 ENCOUNTER — Encounter (HOSPITAL_COMMUNITY): Payer: Medicare Other | Attending: Hematology and Oncology

## 2014-01-10 ENCOUNTER — Encounter (HOSPITAL_COMMUNITY): Payer: Self-pay | Admitting: Hematology and Oncology

## 2014-01-10 ENCOUNTER — Encounter (HOSPITAL_BASED_OUTPATIENT_CLINIC_OR_DEPARTMENT_OTHER): Payer: Medicare Other | Admitting: Oncology

## 2014-01-10 VITALS — BP 122/73 | HR 66 | Temp 98.1°F | Resp 16 | Wt 214.6 lb

## 2014-01-10 DIAGNOSIS — G4733 Obstructive sleep apnea (adult) (pediatric): Secondary | ICD-10-CM | POA: Diagnosis not present

## 2014-01-10 DIAGNOSIS — C8319 Mantle cell lymphoma, extranodal and solid organ sites: Secondary | ICD-10-CM

## 2014-01-10 DIAGNOSIS — C8310 Mantle cell lymphoma, unspecified site: Secondary | ICD-10-CM

## 2014-01-10 DIAGNOSIS — Z5111 Encounter for antineoplastic chemotherapy: Secondary | ICD-10-CM | POA: Diagnosis not present

## 2014-01-10 DIAGNOSIS — D6959 Other secondary thrombocytopenia: Secondary | ICD-10-CM

## 2014-01-10 DIAGNOSIS — C61 Malignant neoplasm of prostate: Secondary | ICD-10-CM | POA: Diagnosis not present

## 2014-01-10 DIAGNOSIS — Z8719 Personal history of other diseases of the digestive system: Secondary | ICD-10-CM | POA: Diagnosis not present

## 2014-01-10 DIAGNOSIS — Z5112 Encounter for antineoplastic immunotherapy: Secondary | ICD-10-CM | POA: Diagnosis not present

## 2014-01-10 LAB — COMPREHENSIVE METABOLIC PANEL
ALK PHOS: 41 U/L (ref 39–117)
ALT: 14 U/L (ref 0–53)
AST: 20 U/L (ref 0–37)
Albumin: 3.7 g/dL (ref 3.5–5.2)
BUN: 17 mg/dL (ref 6–23)
CO2: 26 meq/L (ref 19–32)
Calcium: 8.8 mg/dL (ref 8.4–10.5)
Chloride: 104 mEq/L (ref 96–112)
Creatinine, Ser: 1.06 mg/dL (ref 0.50–1.35)
GFR, EST AFRICAN AMERICAN: 77 mL/min — AB (ref >90)
GFR, EST NON AFRICAN AMERICAN: 66 mL/min — AB (ref >90)
GLUCOSE: 121 mg/dL — AB (ref 70–99)
POTASSIUM: 4.3 meq/L (ref 3.7–5.3)
Sodium: 142 mEq/L (ref 137–147)
Total Bilirubin: 0.5 mg/dL (ref 0.3–1.2)
Total Protein: 6.3 g/dL (ref 6.0–8.3)

## 2014-01-10 LAB — CBC WITH DIFFERENTIAL/PLATELET
Basophils Absolute: 0 10*3/uL (ref 0.0–0.1)
Basophils Relative: 2 % — ABNORMAL HIGH (ref 0–1)
Eosinophils Absolute: 0.1 10*3/uL (ref 0.0–0.7)
Eosinophils Relative: 3 % (ref 0–5)
HCT: 33.3 % — ABNORMAL LOW (ref 39.0–52.0)
HEMOGLOBIN: 11.2 g/dL — AB (ref 13.0–17.0)
LYMPHS ABS: 0.4 10*3/uL — AB (ref 0.7–4.0)
LYMPHS PCT: 21 % (ref 12–46)
MCH: 32.2 pg (ref 26.0–34.0)
MCHC: 33.6 g/dL (ref 30.0–36.0)
MCV: 95.7 fL (ref 78.0–100.0)
MONOS PCT: 13 % — AB (ref 3–12)
Monocytes Absolute: 0.3 10*3/uL (ref 0.1–1.0)
NEUTROS ABS: 1.3 10*3/uL — AB (ref 1.7–7.7)
NEUTROS PCT: 62 % (ref 43–77)
Platelets: 88 10*3/uL — ABNORMAL LOW (ref 150–400)
RBC: 3.48 MIL/uL — AB (ref 4.22–5.81)
RDW: 14.7 % (ref 11.5–15.5)
Smear Review: DECREASED
WBC: 2.1 10*3/uL — AB (ref 4.0–10.5)

## 2014-01-10 LAB — LACTATE DEHYDROGENASE: LDH: 200 U/L (ref 94–250)

## 2014-01-10 MED ORDER — CYANOCOBALAMIN 1000 MCG/ML IJ SOLN
INTRAMUSCULAR | Status: AC
  Filled 2014-01-10: qty 1

## 2014-01-10 MED ORDER — SODIUM CHLORIDE 0.9 % IV SOLN
Freq: Once | INTRAVENOUS | Status: AC
  Administered 2014-01-10: 8 mg via INTRAVENOUS
  Filled 2014-01-10: qty 4

## 2014-01-10 MED ORDER — SODIUM CHLORIDE 0.9 % IJ SOLN
10.0000 mL | INTRAMUSCULAR | Status: DC | PRN
  Administered 2014-01-10: 10 mL

## 2014-01-10 MED ORDER — SODIUM CHLORIDE 0.9 % IV SOLN
90.0000 mg/m2 | Freq: Once | INTRAVENOUS | Status: AC
  Administered 2014-01-10: 207 mg via INTRAVENOUS
  Filled 2014-01-10: qty 2.3

## 2014-01-10 MED ORDER — HEPARIN SOD (PORK) LOCK FLUSH 100 UNIT/ML IV SOLN
500.0000 [IU] | Freq: Once | INTRAVENOUS | Status: AC | PRN
  Administered 2014-01-10: 500 [IU]
  Filled 2014-01-10: qty 5

## 2014-01-10 MED ORDER — SODIUM CHLORIDE 0.9 % IV SOLN
Freq: Once | INTRAVENOUS | Status: AC
  Administered 2014-01-10: 10:00:00 via INTRAVENOUS

## 2014-01-10 MED ORDER — CYANOCOBALAMIN 1000 MCG/ML IJ SOLN
1000.0000 ug | INTRAMUSCULAR | Status: DC
  Administered 2014-01-10: 1000 ug via INTRAMUSCULAR

## 2014-01-10 MED ORDER — SODIUM CHLORIDE 0.9 % IV SOLN: 90.0000 mg/m2 | Freq: Once | INTRAVENOUS | Status: DC

## 2014-01-10 MED ORDER — SODIUM CHLORIDE 0.9 % IV SOLN
375.0000 mg/m2 | Freq: Once | INTRAVENOUS | Status: AC
  Administered 2014-01-10: 900 mg via INTRAVENOUS
  Filled 2014-01-10: qty 90

## 2014-01-10 NOTE — Patient Instructions (Signed)
[facility] Discharge Instructions  RECOMMENDATIONS MADE BY THE CONSULTANT AND ANY TEST RESULTS WILL BE SENT TO YOUR REFERRING PHYSICIAN.  We will see you for chemotherapy as scheduled. You will see the doctor  in 4 weeks. You will need a repeat colonoscopy after cycle 6.  Thank you for choosing [facility] to provide your oncology and hematology care.  To afford each patient quality time with our providers, please arrive at least 15 minutes before your scheduled appointment time.  With your help, our goal is to use those 15 minutes to complete the necessary work-up to ensure our physicians have the information they need to help with your evaluation and healthcare recommendations.    Effective January 1st, 2014, we ask that you re-schedule your appointment with our physicians should you arrive 10 or more minutes late for your appointment.  We strive to give you quality time with our providers, and arriving late affects you and other patients whose appointments are after yours.    Again, thank you for choosing [facility].  Our hope is that these requests will decrease the amount of time that you wait before being seen by our physicians.       _____________________________________________________________  Should you have questions after your visit to [facility], please contact our office at [phone] between the hours of 8:30 a.m. and 5:00 p.m.  Voicemails left after 4:30 p.m. will not be returned until the following business day.  For prescription refill requests, have your pharmacy contact our office with your prescription refill request.

## 2014-01-10 NOTE — Progress Notes (Signed)
Patient reports he took tylenol and benadryl this morning at home prior to this appointment. [name] presents today for injection per MD orders. B12 1000 mcg administered IM in right Upper Arm. Administration without incident. Patient tolerated well. Platelets 88,000. Per Kirby Crigler, PA, proceed with chemotherapy. Tolerated chemotherapy well.

## 2014-01-11 ENCOUNTER — Encounter (HOSPITAL_BASED_OUTPATIENT_CLINIC_OR_DEPARTMENT_OTHER): Payer: Medicare Other

## 2014-01-11 VITALS — BP 131/74 | HR 69 | Temp 97.3°F | Resp 18 | Wt 219.7 lb

## 2014-01-11 DIAGNOSIS — K922 Gastrointestinal hemorrhage, unspecified: Secondary | ICD-10-CM | POA: Diagnosis not present

## 2014-01-11 DIAGNOSIS — C8310 Mantle cell lymphoma, unspecified site: Secondary | ICD-10-CM

## 2014-01-11 DIAGNOSIS — E119 Type 2 diabetes mellitus without complications: Secondary | ICD-10-CM | POA: Diagnosis not present

## 2014-01-11 DIAGNOSIS — Z5111 Encounter for antineoplastic chemotherapy: Secondary | ICD-10-CM | POA: Diagnosis not present

## 2014-01-11 DIAGNOSIS — C61 Malignant neoplasm of prostate: Secondary | ICD-10-CM

## 2014-01-11 DIAGNOSIS — C8319 Mantle cell lymphoma, extranodal and solid organ sites: Secondary | ICD-10-CM

## 2014-01-11 DIAGNOSIS — I10 Essential (primary) hypertension: Secondary | ICD-10-CM | POA: Diagnosis not present

## 2014-01-11 DIAGNOSIS — G4733 Obstructive sleep apnea (adult) (pediatric): Secondary | ICD-10-CM

## 2014-01-11 DIAGNOSIS — Z8719 Personal history of other diseases of the digestive system: Secondary | ICD-10-CM

## 2014-01-11 DIAGNOSIS — R944 Abnormal results of kidney function studies: Secondary | ICD-10-CM | POA: Diagnosis not present

## 2014-01-11 MED ORDER — SODIUM CHLORIDE 0.9 % IJ SOLN
10.0000 mL | INTRAMUSCULAR | Status: DC | PRN
  Administered 2014-01-11: 10 mL

## 2014-01-11 MED ORDER — HEPARIN SOD (PORK) LOCK FLUSH 100 UNIT/ML IV SOLN
500.0000 [IU] | Freq: Once | INTRAVENOUS | Status: DC | PRN
  Filled 2014-01-11: qty 5

## 2014-01-11 MED ORDER — SODIUM CHLORIDE 0.9 % IV SOLN
Freq: Once | INTRAVENOUS | Status: AC
  Administered 2014-01-11: 8 mg via INTRAVENOUS
  Filled 2014-01-11: qty 4

## 2014-01-11 MED ORDER — SODIUM CHLORIDE 0.9 % IV SOLN
90.0000 mg/m2 | Freq: Once | INTRAVENOUS | Status: AC
  Administered 2014-01-11: 207 mg via INTRAVENOUS
  Filled 2014-01-11: qty 2

## 2014-01-11 MED ORDER — SODIUM CHLORIDE 0.9 % IV SOLN
Freq: Once | INTRAVENOUS | Status: AC
  Administered 2014-01-11: 09:00:00 via INTRAVENOUS

## 2014-01-11 NOTE — Patient Instructions (Signed)
[facility] Discharge Instructions for Patients Receiving Chemotherapy  Today you received the following chemotherapy agents Bendamustine Day 2. Yesterday you had Rituxan, Bendamustine Day 1 and a B12 injection. We will continue current chemotherapy plan. Return to clinic Thursday for Neulasta injection. Report any issues/concerns to clinic as needed prior to appointments.  If you develop nausea and vomiting that is not controlled by your nausea medication, call the clinic. If it is after clinic hours your family physician or the after hours number for the clinic or go to the Emergency Department.   BELOW ARE SYMPTOMS THAT SHOULD BE REPORTED IMMEDIATELY:  *FEVER GREATER THAN 101.0 F  *CHILLS WITH OR WITHOUT FEVER  NAUSEA AND VOMITING THAT IS NOT CONTROLLED WITH YOUR NAUSEA MEDICATION  *UNUSUAL SHORTNESS OF BREATH  *UNUSUAL BRUISING OR BLEEDING  TENDERNESS IN MOUTH AND THROAT WITH OR WITHOUT PRESENCE OF ULCERS  *URINARY PROBLEMS  *BOWEL PROBLEMS  UNUSUAL RASH Items with * indicate a potential emergency and should be followed up as soon as possible.  One of the nurses will contact you 24 hours after your treatment. Please let the nurse know about any problems that you may have experienced. Feel free to call the clinic you have any questions or concerns. The clinic phone number is [phone].   I have been informed and understand all the instructions given to me. I know to contact the clinic, my physician, or go to the Emergency Department if any problems should occur. I do not have any questions at this time, but understand that I may call the clinic during office hours or the Patient Navigator at [phone] should I have any questions or need assistance in obtaining follow up care.    __________________________________________  _____________  __________ Signature of Patient or Authorized Representative            Date                    Time    __________________________________________ Nurse's Signature

## 2014-01-13 ENCOUNTER — Encounter (HOSPITAL_BASED_OUTPATIENT_CLINIC_OR_DEPARTMENT_OTHER): Payer: Medicare Other

## 2014-01-13 VITALS — BP 139/78 | HR 76 | Temp 97.7°F | Resp 18

## 2014-01-13 DIAGNOSIS — C8319 Mantle cell lymphoma, extranodal and solid organ sites: Secondary | ICD-10-CM

## 2014-01-13 DIAGNOSIS — C8310 Mantle cell lymphoma, unspecified site: Secondary | ICD-10-CM

## 2014-01-13 DIAGNOSIS — Z5189 Encounter for other specified aftercare: Secondary | ICD-10-CM | POA: Diagnosis not present

## 2014-01-13 DIAGNOSIS — Z8719 Personal history of other diseases of the digestive system: Secondary | ICD-10-CM

## 2014-01-13 DIAGNOSIS — G4733 Obstructive sleep apnea (adult) (pediatric): Secondary | ICD-10-CM

## 2014-01-13 DIAGNOSIS — C61 Malignant neoplasm of prostate: Secondary | ICD-10-CM

## 2014-01-13 MED ORDER — PEGFILGRASTIM INJECTION 6 MG/0.6ML
6.0000 mg | Freq: Once | SUBCUTANEOUS | Status: AC
  Administered 2014-01-13: 6 mg via SUBCUTANEOUS

## 2014-01-13 MED ORDER — PEGFILGRASTIM INJECTION 6 MG/0.6ML
SUBCUTANEOUS | Status: AC
  Filled 2014-01-13: qty 0.6

## 2014-01-13 NOTE — Progress Notes (Signed)
[name] presents today for injection per MD orders. Neulasta 6mg  administered SQ in left Abdomen. Administration without incident. Patient tolerated well.

## 2014-02-06 NOTE — Progress Notes (Signed)
Brandon Cahill, MD  [place] [address]  Mantle cell lymphoma  CURRENT THERAPY: To embark on cycle 6 of Bendamustine/Rituxan  INTERVAL HISTORY: [name] 79 y.o. male returns for  regular  visit for followup of mantle cell lymphoma of the colon receiving bendamustine/Rituxan followed by Neulasta support.    Mantle cell lymphoma   08/27/2013 Initial Diagnosis Colon, biopsy, random - MANTLE CELL LYMPHOMA   09/06/2013 Imaging CT CAP- No significant lymphadenopathy identified within the chest, abdomen or pelvis.   09/14/2013 -  Chemotherapy Bendamustine/Rituxan x 6 cycles   I personally reviewed and went over laboratory results with the patient.  The results are noted within this dictation.   he continues to tolerate therapy well without any complaints.  Today is his last planned cycle at the present time (cycle 6).    He will need a repeat colonoscopy in about 3 weeks to evaluate response to therapy.  Depending on the results from this test and biopsy, we will develop future treatment plan versus maintenance program.  He is agreeable to this.   Oncologically, he denies any complaints and ROS questioning is negative.   Past Medical History  Diagnosis Date  . Type 2 diabetes mellitus   . 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 Mixed hyperlipidemia   . PVD (peripheral vascular disease)   . Essential hypertension, benign   . Obstructive sleep apnea     On CPAP  . Prostate cancer   . Colon cancer     has Mixed hyperlipidemia; HYPERTENSION, BENIGN; Coronary atherosclerosis of native coronary artery; VASOMOTOR RHINITIS; GERD; OSA (obstructive sleep apnea); H/O ulcerative colitis; Mantle cell lymphoma; Prostate cancer; Proctitis, radiation; Abdominal pain, left lower quadrant; Abdominal pain, unspecified site; Lower GI bleed; Diverticulitis; Leukopenia; Diverticulosis of  colon with hemorrhage; Thrombocytopenia, unspecified; and Acute blood loss anemia on his problem list.     is allergic to bee venom; adhesive; ciprofloxacin; codeine; iodine; latex; povidone-iodine; and simvastatin.  [name] does not currently have medications on file.  Past Surgical History  Procedure Laterality Date  . Tonsillectomy    . Back surgery    . Vasectomy    . Knee surgery      Right  . Shoulder surgery    . Prostatectomy    . Colonoscopy with esophagogastroduodenoscopy (egd) N/A 08/27/2013    Procedure: COLONOSCOPY WITH ESOPHAGOGASTRODUODENOSCOPY (EGD);  Surgeon: Brandon Houston, MD;  [facility];  Service: Endoscopy;  Laterality: N/A;  730  . Balloon dilation N/A 08/27/2013    Procedure: BALLOON DILATION;  Surgeon: Brandon Houston, MD;  [facility];  Service: Endoscopy;  Laterality: N/A;  [name] dilation N/A 08/27/2013    Procedure: [name] DILATION;  Surgeon: Brandon Houston, MD;  [facility];  Service: Endoscopy;  Laterality: N/A;  . Savory dilation N/A 08/27/2013    Procedure: SAVORY DILATION;  Surgeon: Brandon Houston, MD;  [facility];  Service: Endoscopy;  Laterality: N/A;  . Portacath placement Right 2014  . Colonoscopy N/A 12/17/2013    Procedure: COLONOSCOPY;  Surgeon: Brandon Houston, MD;  [facility];  Service: Endoscopy;  Laterality: N/A;  940  . Colon surgery      Denies any headaches, dizziness, double vision, fevers, chills, night sweats, nausea, vomiting, diarrhea, constipation, chest pain, heart palpitations, shortness of  breath, blood in stool, black tarry stool, urinary pain, urinary burning, urinary frequency, hematuria.   PHYSICAL EXAMINATION  ECOG PERFORMANCE STATUS: 0 - Asymptomatic  There were no vitals filed for this visit.  GENERAL:alert, healthy, no distress, well nourished, well developed, comfortable, cooperative and smiling SKIN: skin color, texture, turgor are normal, no  rashes or significant lesions HEAD: Normocephalic, No masses, lesions, tenderness or abnormalities EYES: normal, PERRLA, EOMI, Conjunctiva are pink and non-injected EARS: External ears normal OROPHARYNX:mucous membranes are moist  NECK: supple, trachea midline LYMPH:  not examined BREAST:not examined LUNGS: not examined HEART: not examined ABDOMEN:obese BACK: Back symmetric, no curvature. EXTREMITIES:less then 2 second capillary refill, no joint deformities, effusion, or inflammation, no skin discoloration, no cyanosis  NEURO: alert & oriented x 3 with fluent speech, no focal motor/sensory deficits   LABORATORY DATA: CBC    Component Value Date/Time   WBC 2.4* 02/07/2014 0845   RBC 3.61* 02/07/2014 0845   RBC 5.02 09/14/2013 0831   HGB 11.5* 02/07/2014 0845   HCT 33.8* 02/07/2014 0845   PLT 111* 02/07/2014 0845   MCV 93.6 02/07/2014 0845   MCH 31.9 02/07/2014 0845   MCHC 34.0 02/07/2014 0845   RDW 14.5 02/07/2014 0845   LYMPHSABS 0.5* 02/07/2014 0845   MONOABS 0.4 02/07/2014 0845   EOSABS 0.1 02/07/2014 0845   BASOSABS 0.0 02/07/2014 0845      Chemistry      Component Value Date/Time   NA 142 02/07/2014 0845   K 4.5 02/07/2014 0845   CL 105 02/07/2014 0845   CO2 26 02/07/2014 0845   BUN 23 02/07/2014 0845   CREATININE 1.20 02/07/2014 0845      Component Value Date/Time   CALCIUM 9.0 02/07/2014 0845   ALKPHOS 49 02/07/2014 0845   AST 24 02/07/2014 0845   ALT 18 02/07/2014 0845   BILITOT 0.5 02/07/2014 0845     Results for [name], [name] (MRN [id number]) as of 02/07/2014 11:44  Ref. Range 02/07/2014 08:45  LDH Latest Range: 94-250 U/L 230     ASSESSMENT:  1.Stage I-E. mantle cell lymphoma involving the colon  2. Ulcerative colitis, improved.  3. Depression and anxiety disorder, much improved.  4. Past history of prostate cancer  5. Tremors associated with Compazine.  6. Thrombocytopenia secondary to previous chemotherapy  Patient Active Problem List   Diagnosis Date Noted    . Diverticulosis of colon with hemorrhage 01/01/2014  . Thrombocytopenia, unspecified 01/01/2014  . Acute blood loss anemia 01/01/2014  . Leukopenia 12/31/2013  . Lower GI bleed 12/30/2013  . Diverticulitis 12/30/2013  . Abdominal pain, left lower quadrant 11/16/2013  . Abdominal pain, unspecified site 11/16/2013  . Mantle cell lymphoma 09/07/2013  . Prostate cancer 09/07/2013  . Proctitis, 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  . H/O ulcerative colitis 04/12/2013  . OSA (obstructive sleep apnea) 08/15/2012  . VASOMOTOR RHINITIS 01/23/2010  . GERD 01/23/2010  . Mixed hyperlipidemia 11/23/2009  . HYPERTENSION, BENIGN 11/23/2009  . Coronary atherosclerosis of native coronary artery 02/18/2009     PLAN:  1. I personally reviewed and went over laboratory results with the patient.  The results are noted within this dictation. 2. Pre-chemo labs as planned 3. Cycle 6 today as planned. 4. Referral to Dr. Laural Golden for repeat colonoscopy following cycle 6 of chemotherapy. This should be done in about 3 weeks.  A intra-CHL message was sent to Dr. Laural Golden with this request. 5. Return in 4 weeks following colonoscopy by Dr. Laural Golden and pathology report to discuss future  intervention/surveillance.   THERAPY PLAN:  He is completing cycle 6 of Bendamustine/Rituxan today and tomorrow.  He will need a repeat Colonoscopy by Dr. Laural Golden to evaluate response to therapy.  We will see him back following to review pathology results and discuss future oncology intervention.   All questions were answered. The patient knows to call the clinic with any problems, questions or concerns. We can certainly see the patient much sooner if necessary.  Patient and plan discussed with Dr. Farrel Gobble and he is in agreement with the aforementioned.    Baird Cancer 02/07/2014

## 2014-02-07 ENCOUNTER — Encounter (HOSPITAL_BASED_OUTPATIENT_CLINIC_OR_DEPARTMENT_OTHER): Payer: Medicare Other | Admitting: Oncology

## 2014-02-07 ENCOUNTER — Encounter (HOSPITAL_COMMUNITY): Payer: Medicare Other | Attending: Hematology and Oncology

## 2014-02-07 ENCOUNTER — Encounter (HOSPITAL_COMMUNITY): Payer: Self-pay

## 2014-02-07 ENCOUNTER — Telehealth (INDEPENDENT_AMBULATORY_CARE_PROVIDER_SITE_OTHER): Payer: Self-pay | Admitting: *Deleted

## 2014-02-07 ENCOUNTER — Encounter (HOSPITAL_COMMUNITY): Payer: Self-pay | Admitting: Oncology

## 2014-02-07 ENCOUNTER — Other Ambulatory Visit (INDEPENDENT_AMBULATORY_CARE_PROVIDER_SITE_OTHER): Payer: Self-pay | Admitting: *Deleted

## 2014-02-07 VITALS — BP 137/83 | HR 75 | Temp 98.0°F | Resp 18 | Wt 215.4 lb

## 2014-02-07 DIAGNOSIS — C8319 Mantle cell lymphoma, extranodal and solid organ sites: Secondary | ICD-10-CM

## 2014-02-07 DIAGNOSIS — C61 Malignant neoplasm of prostate: Secondary | ICD-10-CM | POA: Diagnosis not present

## 2014-02-07 DIAGNOSIS — C8310 Mantle cell lymphoma, unspecified site: Secondary | ICD-10-CM

## 2014-02-07 DIAGNOSIS — Z8546 Personal history of malignant neoplasm of prostate: Secondary | ICD-10-CM

## 2014-02-07 DIAGNOSIS — F341 Dysthymic disorder: Secondary | ICD-10-CM | POA: Diagnosis not present

## 2014-02-07 DIAGNOSIS — G4733 Obstructive sleep apnea (adult) (pediatric): Secondary | ICD-10-CM | POA: Diagnosis not present

## 2014-02-07 DIAGNOSIS — D6959 Other secondary thrombocytopenia: Secondary | ICD-10-CM | POA: Diagnosis not present

## 2014-02-07 DIAGNOSIS — Z5112 Encounter for antineoplastic immunotherapy: Secondary | ICD-10-CM | POA: Diagnosis not present

## 2014-02-07 DIAGNOSIS — Z5111 Encounter for antineoplastic chemotherapy: Secondary | ICD-10-CM

## 2014-02-07 DIAGNOSIS — Z1211 Encounter for screening for malignant neoplasm of colon: Secondary | ICD-10-CM

## 2014-02-07 DIAGNOSIS — Z8719 Personal history of other diseases of the digestive system: Secondary | ICD-10-CM | POA: Diagnosis not present

## 2014-02-07 DIAGNOSIS — K5190 Ulcerative colitis, unspecified, without complications: Secondary | ICD-10-CM | POA: Diagnosis not present

## 2014-02-07 DIAGNOSIS — E538 Deficiency of other specified B group vitamins: Secondary | ICD-10-CM

## 2014-02-07 HISTORY — DX: Deficiency of other specified B group vitamins: E53.8

## 2014-02-07 LAB — CBC WITH DIFFERENTIAL/PLATELET
BASOS ABS: 0 10*3/uL (ref 0.0–0.1)
BASOS PCT: 1 % (ref 0–1)
Eosinophils Absolute: 0.1 10*3/uL (ref 0.0–0.7)
Eosinophils Relative: 3 % (ref 0–5)
HCT: 33.8 % — ABNORMAL LOW (ref 39.0–52.0)
Hemoglobin: 11.5 g/dL — ABNORMAL LOW (ref 13.0–17.0)
Lymphocytes Relative: 20 % (ref 12–46)
Lymphs Abs: 0.5 10*3/uL — ABNORMAL LOW (ref 0.7–4.0)
MCH: 31.9 pg (ref 26.0–34.0)
MCHC: 34 g/dL (ref 30.0–36.0)
MCV: 93.6 fL (ref 78.0–100.0)
MONO ABS: 0.4 10*3/uL (ref 0.1–1.0)
Monocytes Relative: 15 % — ABNORMAL HIGH (ref 3–12)
NEUTROS ABS: 1.5 10*3/uL — AB (ref 1.7–7.7)
NEUTROS PCT: 62 % (ref 43–77)
Platelets: 111 10*3/uL — ABNORMAL LOW (ref 150–400)
RBC: 3.61 MIL/uL — ABNORMAL LOW (ref 4.22–5.81)
RDW: 14.5 % (ref 11.5–15.5)
WBC: 2.4 10*3/uL — ABNORMAL LOW (ref 4.0–10.5)

## 2014-02-07 LAB — COMPREHENSIVE METABOLIC PANEL
ALT: 18 U/L (ref 0–53)
AST: 24 U/L (ref 0–37)
Albumin: 3.7 g/dL (ref 3.5–5.2)
Alkaline Phosphatase: 49 U/L (ref 39–117)
BUN: 23 mg/dL (ref 6–23)
CO2: 26 mEq/L (ref 19–32)
Calcium: 9 mg/dL (ref 8.4–10.5)
Chloride: 105 mEq/L (ref 96–112)
Creatinine, Ser: 1.2 mg/dL (ref 0.50–1.35)
GFR calc non Af Amer: 57 mL/min — ABNORMAL LOW (ref >90)
GFR, EST AFRICAN AMERICAN: 66 mL/min — AB (ref >90)
Glucose, Bld: 224 mg/dL — ABNORMAL HIGH (ref 70–99)
POTASSIUM: 4.5 meq/L (ref 3.7–5.3)
Sodium: 142 mEq/L (ref 137–147)
TOTAL PROTEIN: 6.1 g/dL (ref 6.0–8.3)
Total Bilirubin: 0.5 mg/dL (ref 0.3–1.2)

## 2014-02-07 LAB — LACTATE DEHYDROGENASE: LDH: 230 U/L (ref 94–250)

## 2014-02-07 MED ORDER — CYANOCOBALAMIN 1000 MCG/ML IJ SOLN
1000.0000 ug | Freq: Once | INTRAMUSCULAR | Status: AC
  Administered 2014-02-07: 1000 ug via INTRAMUSCULAR
  Filled 2014-02-07: qty 1

## 2014-02-07 MED ORDER — HEPARIN SOD (PORK) LOCK FLUSH 100 UNIT/ML IV SOLN
500.0000 [IU] | Freq: Once | INTRAVENOUS | Status: AC | PRN
  Administered 2014-02-07: 500 [IU]
  Filled 2014-02-07: qty 5

## 2014-02-07 MED ORDER — SODIUM CHLORIDE 0.9 % IV SOLN
Freq: Once | INTRAVENOUS | Status: AC
  Administered 2014-02-07: 10:00:00 via INTRAVENOUS

## 2014-02-07 MED ORDER — SODIUM CHLORIDE 0.9 % IJ SOLN: 10.0000 mL | INTRAMUSCULAR | Status: DC | PRN

## 2014-02-07 MED ORDER — SODIUM CHLORIDE 0.9 % IV SOLN
Freq: Once | INTRAVENOUS | Status: AC
  Administered 2014-02-07: 8 mg via INTRAVENOUS
  Filled 2014-02-07: qty 4

## 2014-02-07 MED ORDER — SODIUM CHLORIDE 0.9 % IV SOLN: 90.0000 mg/m2 | Freq: Once | INTRAVENOUS | Status: DC

## 2014-02-07 MED ORDER — SODIUM CHLORIDE 0.9 % IV SOLN
90.0000 mg/m2 | Freq: Once | INTRAVENOUS | Status: AC
  Administered 2014-02-07: 207 mg via INTRAVENOUS
  Filled 2014-02-07: qty 2

## 2014-02-07 MED ORDER — DIPHENHYDRAMINE HCL 25 MG PO CAPS: 50.0000 mg | ORAL_CAPSULE | Freq: Once | ORAL | Status: DC

## 2014-02-07 MED ORDER — ACETAMINOPHEN 325 MG PO TABS: 650.0000 mg | ORAL_TABLET | Freq: Once | ORAL | Status: DC

## 2014-02-07 MED ORDER — RITUXIMAB CHEMO INJECTION 10 MG/ML
375.0000 mg/m2 | Freq: Once | INTRAVENOUS | Status: AC
  Administered 2014-02-07: 900 mg via INTRAVENOUS
  Filled 2014-02-07: qty 50

## 2014-02-07 NOTE — Telephone Encounter (Signed)
Patient needs movi prep 

## 2014-02-07 NOTE — Progress Notes (Signed)
[name] presents today for injection per MD orders. B12 1000 mcg administered IM in left deltoid. Administration without incident. Patient tolerated well.

## 2014-02-07 NOTE — Patient Instructions (Signed)
[facility] Discharge Instructions  RECOMMENDATIONS MADE BY THE CONSULTANT AND ANY TEST RESULTS WILL BE SENT TO YOUR REFERRING PHYSICIAN.  EXAM FINDINGS BY THE PHYSICIAN TODAY AND SIGNS OR SYMPTOMS TO REPORT TO CLINIC OR PRIMARY PHYSICIAN: tom Kefelas   INSTRUCTIONS GIVEN AND DISCUSSED: Pre-chemo labs today, Cycle 6 today of chemotherapy,  Referral to Dr. Laural Golden for repeat colonoscopy following cycle 6 of chemotherapy.  Return in 4-6 weeks following colonoscopy by Dr. Laural Golden and pathology report to discuss future intervention/surveillance.  Follow up appt in 6 wks with doctor, if at that time you have not had your colonoscopy please call to change appt   Thank you for choosing [facility] to provide your oncology and hematology care.  To afford each patient quality time with our providers, please arrive at least 15 minutes before your scheduled appointment time.  With your help, our goal is to use those 15 minutes to complete the necessary work-up to ensure our physicians have the information they need to help with your evaluation and healthcare recommendations.    Effective January 1st, 2014, we ask that you re-schedule your appointment with our physicians should you arrive 10 or more minutes late for your appointment.  We strive to give you quality time with our providers, and arriving late affects you and other patients whose appointments are after yours.    Again, thank you for choosing [facility].  Our hope is that these requests will decrease the amount of time that you wait before being seen by our physicians.       _____________________________________________________________  Should you have questions after your visit to [facility], please contact our office at [phone] between the hours of 8:30 a.m. and 5:00 p.m.  Voicemails left after 4:30 p.m. will not be returned until the following business day.  For prescription  refill requests, have your pharmacy contact our office with your prescription refill request.

## 2014-02-08 ENCOUNTER — Telehealth (INDEPENDENT_AMBULATORY_CARE_PROVIDER_SITE_OTHER): Payer: Self-pay | Admitting: *Deleted

## 2014-02-08 ENCOUNTER — Encounter (HOSPITAL_BASED_OUTPATIENT_CLINIC_OR_DEPARTMENT_OTHER): Payer: Medicare Other

## 2014-02-08 VITALS — BP 136/78 | HR 78 | Temp 98.0°F | Resp 18 | Wt 217.0 lb

## 2014-02-08 DIAGNOSIS — C8310 Mantle cell lymphoma, unspecified site: Secondary | ICD-10-CM

## 2014-02-08 DIAGNOSIS — C8319 Mantle cell lymphoma, extranodal and solid organ sites: Secondary | ICD-10-CM

## 2014-02-08 DIAGNOSIS — Z5111 Encounter for antineoplastic chemotherapy: Secondary | ICD-10-CM | POA: Diagnosis not present

## 2014-02-08 DIAGNOSIS — G4733 Obstructive sleep apnea (adult) (pediatric): Secondary | ICD-10-CM

## 2014-02-08 DIAGNOSIS — C61 Malignant neoplasm of prostate: Secondary | ICD-10-CM

## 2014-02-08 DIAGNOSIS — Z8719 Personal history of other diseases of the digestive system: Secondary | ICD-10-CM

## 2014-02-08 MED ORDER — SODIUM CHLORIDE 0.9 % IV SOLN
90.0000 mg/m2 | Freq: Once | INTRAVENOUS | Status: AC
  Administered 2014-02-08: 207 mg via INTRAVENOUS
  Filled 2014-02-08: qty 2.3

## 2014-02-08 MED ORDER — SODIUM CHLORIDE 0.9 % IV SOLN
Freq: Once | INTRAVENOUS | Status: AC
  Administered 2014-02-08: 8 mg via INTRAVENOUS
  Filled 2014-02-08: qty 4

## 2014-02-08 MED ORDER — PEG-KCL-NACL-NASULF-NA ASC-C 100 G PO SOLR: 1.0000 | Freq: Once | ORAL | Status: DC

## 2014-02-08 MED ORDER — HEPARIN SOD (PORK) LOCK FLUSH 100 UNIT/ML IV SOLN
500.0000 [IU] | Freq: Once | INTRAVENOUS | Status: AC | PRN
  Administered 2014-02-08: 500 [IU]
  Filled 2014-02-08: qty 5

## 2014-02-08 MED ORDER — SODIUM CHLORIDE 0.9 % IV SOLN
Freq: Once | INTRAVENOUS | Status: AC
  Administered 2014-02-08: 10:00:00 via INTRAVENOUS

## 2014-02-08 MED ORDER — SODIUM CHLORIDE 0.9 % IJ SOLN
10.0000 mL | INTRAMUSCULAR | Status: DC | PRN
  Administered 2014-02-08: 10 mL

## 2014-02-08 NOTE — Progress Notes (Signed)
Tolerated well

## 2014-02-08 NOTE — Telephone Encounter (Signed)
agree

## 2014-02-08 NOTE — Telephone Encounter (Signed)
  Procedure: tcs  Reason/Indication:  Mantle cell lymphoma  Has patient had this procedure before?  Yes, 11/2013 -- epic  If so, when, by whom and where?    Is there a family history of colon cancer?    Who?  What age when diagnosed?    Is patient diabetic?   yes      Does patient have prosthetic heart valve?  no  Do you have a pacemaker?  no  Has patient ever had endocarditis? no  Has patient had joint replacement within last 12 months?  no  Does patient tend to be constipated or take laxatives? no  Is patient on Coumadin, Plavix and/or Aspirin? no  Medications: see EPIC  Allergies: see EPIC  Medication Adjustment: 1/2 DM meds day before & hold morning of  Procedure date & time: 02/28/14 at 730

## 2014-02-09 ENCOUNTER — Ambulatory Visit (HOSPITAL_COMMUNITY): Payer: Medicare Other

## 2014-02-09 ENCOUNTER — Encounter (HOSPITAL_BASED_OUTPATIENT_CLINIC_OR_DEPARTMENT_OTHER): Payer: Medicare Other

## 2014-02-09 ENCOUNTER — Encounter (HOSPITAL_COMMUNITY): Payer: Self-pay | Admitting: Pharmacy Technician

## 2014-02-09 DIAGNOSIS — Z5189 Encounter for other specified aftercare: Secondary | ICD-10-CM

## 2014-02-09 DIAGNOSIS — C8310 Mantle cell lymphoma, unspecified site: Secondary | ICD-10-CM

## 2014-02-09 DIAGNOSIS — Z8719 Personal history of other diseases of the digestive system: Secondary | ICD-10-CM

## 2014-02-09 DIAGNOSIS — C8319 Mantle cell lymphoma, extranodal and solid organ sites: Secondary | ICD-10-CM | POA: Diagnosis not present

## 2014-02-09 DIAGNOSIS — C61 Malignant neoplasm of prostate: Secondary | ICD-10-CM

## 2014-02-09 DIAGNOSIS — G4733 Obstructive sleep apnea (adult) (pediatric): Secondary | ICD-10-CM

## 2014-02-09 LAB — BETA 2 MICROGLOBULIN, SERUM: Beta-2 Microglobulin: 5.29 mg/L — ABNORMAL HIGH (ref <=2.51)

## 2014-02-09 MED ORDER — PEGFILGRASTIM INJECTION 6 MG/0.6ML
6.0000 mg | Freq: Once | SUBCUTANEOUS | Status: AC
  Administered 2014-02-09: 6 mg via SUBCUTANEOUS

## 2014-02-09 MED ORDER — PEGFILGRASTIM INJECTION 6 MG/0.6ML
SUBCUTANEOUS | Status: AC
  Filled 2014-02-09: qty 0.6

## 2014-02-09 NOTE — Progress Notes (Signed)
[name] presents today for injection per MD orders. Neulasta 6mg  administered SQ in left Abdomen. Administration without incident. Patient tolerated well.

## 2014-02-10 ENCOUNTER — Ambulatory Visit (HOSPITAL_COMMUNITY): Payer: Medicare Other

## 2014-02-21 DIAGNOSIS — L0390 Cellulitis, unspecified: Secondary | ICD-10-CM | POA: Diagnosis not present

## 2014-02-21 DIAGNOSIS — L0291 Cutaneous abscess, unspecified: Secondary | ICD-10-CM | POA: Diagnosis not present

## 2014-02-28 ENCOUNTER — Encounter (HOSPITAL_COMMUNITY): Payer: Self-pay

## 2014-02-28 ENCOUNTER — Encounter (HOSPITAL_COMMUNITY)
Admission: RE | Disposition: A | Discharge status: Home / Self Care | Payer: Self-pay | Source: Ambulatory Visit | Attending: Internal Medicine

## 2014-02-28 ENCOUNTER — Ambulatory Visit (HOSPITAL_COMMUNITY)
Admission: RE | Admit: 2014-02-28 | Discharge: 2014-02-28 | Disposition: A | Discharge status: Home / Self Care | Payer: Medicare Other | Source: Ambulatory Visit | Attending: Internal Medicine | Admitting: Internal Medicine

## 2014-02-28 DIAGNOSIS — C8319 Mantle cell lymphoma, extranodal and solid organ sites: Secondary | ICD-10-CM | POA: Diagnosis not present

## 2014-02-28 DIAGNOSIS — Z8546 Personal history of malignant neoplasm of prostate: Secondary | ICD-10-CM | POA: Insufficient documentation

## 2014-02-28 DIAGNOSIS — G4733 Obstructive sleep apnea (adult) (pediatric): Secondary | ICD-10-CM | POA: Diagnosis not present

## 2014-02-28 DIAGNOSIS — Z9221 Personal history of antineoplastic chemotherapy: Secondary | ICD-10-CM | POA: Insufficient documentation

## 2014-02-28 DIAGNOSIS — K644 Residual hemorrhoidal skin tags: Secondary | ICD-10-CM | POA: Diagnosis not present

## 2014-02-28 DIAGNOSIS — K5730 Diverticulosis of large intestine without perforation or abscess without bleeding: Secondary | ICD-10-CM

## 2014-02-28 DIAGNOSIS — E785 Hyperlipidemia, unspecified: Secondary | ICD-10-CM | POA: Insufficient documentation

## 2014-02-28 DIAGNOSIS — E119 Type 2 diabetes mellitus without complications: Secondary | ICD-10-CM | POA: Insufficient documentation

## 2014-02-28 DIAGNOSIS — Z883 Allergy status to other anti-infective agents status: Secondary | ICD-10-CM | POA: Insufficient documentation

## 2014-02-28 DIAGNOSIS — I739 Peripheral vascular disease, unspecified: Secondary | ICD-10-CM | POA: Insufficient documentation

## 2014-02-28 DIAGNOSIS — Z79899 Other long term (current) drug therapy: Secondary | ICD-10-CM | POA: Insufficient documentation

## 2014-02-28 DIAGNOSIS — Z794 Long term (current) use of insulin: Secondary | ICD-10-CM | POA: Diagnosis not present

## 2014-02-28 DIAGNOSIS — Z91040 Latex allergy status: Secondary | ICD-10-CM | POA: Insufficient documentation

## 2014-02-28 DIAGNOSIS — Z888 Allergy status to other drugs, medicaments and biological substances status: Secondary | ICD-10-CM | POA: Insufficient documentation

## 2014-02-28 DIAGNOSIS — Z91038 Other insect allergy status: Secondary | ICD-10-CM | POA: Insufficient documentation

## 2014-02-28 DIAGNOSIS — K6389 Other specified diseases of intestine: Secondary | ICD-10-CM

## 2014-02-28 DIAGNOSIS — Z881 Allergy status to other antibiotic agents status: Secondary | ICD-10-CM | POA: Diagnosis not present

## 2014-02-28 DIAGNOSIS — I2510 Atherosclerotic heart disease of native coronary artery without angina pectoris: Secondary | ICD-10-CM | POA: Insufficient documentation

## 2014-02-28 DIAGNOSIS — Z885 Allergy status to narcotic agent status: Secondary | ICD-10-CM | POA: Insufficient documentation

## 2014-02-28 DIAGNOSIS — K219 Gastro-esophageal reflux disease without esophagitis: Secondary | ICD-10-CM | POA: Insufficient documentation

## 2014-02-28 DIAGNOSIS — D126 Benign neoplasm of colon, unspecified: Secondary | ICD-10-CM | POA: Diagnosis not present

## 2014-02-28 DIAGNOSIS — E538 Deficiency of other specified B group vitamins: Secondary | ICD-10-CM | POA: Diagnosis not present

## 2014-02-28 DIAGNOSIS — C8313 Mantle cell lymphoma, intra-abdominal lymph nodes: Secondary | ICD-10-CM | POA: Insufficient documentation

## 2014-02-28 DIAGNOSIS — C8310 Mantle cell lymphoma, unspecified site: Secondary | ICD-10-CM

## 2014-02-28 HISTORY — DX: Gastro-esophageal reflux disease without esophagitis: K21.9

## 2014-02-28 HISTORY — PX: COLONOSCOPY: SHX5424

## 2014-02-28 LAB — GLUCOSE, CAPILLARY: GLUCOSE-CAPILLARY: 124 mg/dL — AB (ref 70–99)

## 2014-02-28 SURGERY — COLONOSCOPY
Anesthesia: Moderate Sedation

## 2014-02-28 MED ORDER — MIDAZOLAM HCL 5 MG/5ML IJ SOLN
INTRAMUSCULAR | Status: DC | PRN
  Administered 2014-02-28 (×2): 2 mg via INTRAVENOUS
  Administered 2014-02-28: 3 mg via INTRAVENOUS
  Administered 2014-02-28: 2 mg via INTRAVENOUS

## 2014-02-28 MED ORDER — STERILE WATER FOR IRRIGATION IR SOLN
Status: DC | PRN
  Administered 2014-02-28: 08:00:00

## 2014-02-28 MED ORDER — SODIUM CHLORIDE 0.9 % IV SOLN
INTRAVENOUS | Status: DC
  Administered 2014-02-28: 07:00:00 via INTRAVENOUS

## 2014-02-28 MED ORDER — MEPERIDINE HCL 50 MG/ML IJ SOLN
INTRAMUSCULAR | Status: DC | PRN
  Administered 2014-02-28 (×2): 25 mg via INTRAVENOUS

## 2014-02-28 MED ORDER — MIDAZOLAM HCL 5 MG/5ML IJ SOLN
INTRAMUSCULAR | Status: AC
  Filled 2014-02-28: qty 10

## 2014-02-28 MED ORDER — MEPERIDINE HCL 50 MG/ML IJ SOLN
INTRAMUSCULAR | Status: AC
  Filled 2014-02-28: qty 1

## 2014-02-28 NOTE — H&P (Signed)
[name] is an 79 y.o. male.   Chief Complaint: Patient is here for colonoscopy. HPI: Patient is 79 year old Caucasian male who has history of ulcerative colitis. He underwent colonoscopy in December 2014 for heme-positive stool and was diagnosed with mantle cell lymphoma. He underwent followup colonoscopy in March 2015 and still had a few residual foci of abnormal cells. He has received another 2 cycles of chemotherapy and on returning for reevaluation and restaging of his disease. He denies abdominal pain melena or rectal bleeding. He generally has 2 formed stools daily.  Past Medical History  Diagnosis Date  . Type 2 diabetes mellitus   . 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 Mixed hyperlipidemia   . PVD (peripheral vascular disease)   . Essential hypertension, benign   . Prostate cancer   . Colon cancer   . B12 deficiency 02/07/2014  . Obstructive sleep apnea     does not use  . GERD (gastroesophageal reflux disease)     Past Surgical History  Procedure Laterality Date  . Tonsillectomy    . Back surgery    . Vasectomy    . Knee surgery Right     total knee  . Shoulder surgery    . Prostatectomy    . Colonoscopy with esophagogastroduodenoscopy (egd) N/A 08/27/2013    Procedure: COLONOSCOPY WITH ESOPHAGOGASTRODUODENOSCOPY (EGD);  Surgeon: Rogene Houston, MD;  [facility];  Service: Endoscopy;  Laterality: N/A;  730  . Balloon dilation N/A 08/27/2013    Procedure: BALLOON DILATION;  Surgeon: Rogene Houston, MD;  Location: [facility];  Service: Endoscopy;  Laterality: N/A;  Venia Minks dilation N/A 08/27/2013    Procedure: Venia Minks DILATION;  Surgeon: Rogene Houston, MD;  Location: [facility];  Service: Endoscopy;  Laterality: N/A;  . Savory dilation N/A 08/27/2013    Procedure: SAVORY DILATION;  Surgeon: Rogene Houston, MD;  Location: [facility];  Service:  Endoscopy;  Laterality: N/A;  . Portacath placement Right 2014  . Colonoscopy N/A 12/17/2013    Procedure: COLONOSCOPY;  Surgeon: Rogene Houston, MD;  Location: [facility];  Service: Endoscopy;  Laterality: N/A;  940  . Colon surgery    . Knee arthroscopy Left   . Removal of port Right     Family History  Problem Relation Age of Onset  . Colon cancer Brother   . Cancer Brother   . Diabetes Father   . Cancer Brother    Social History:  reports that he has never smoked. He has never used smokeless tobacco. He reports that he does not drink alcohol or use illicit drugs.  Allergies:  Allergies  Allergen Reactions  . Bee Venom Anaphylaxis  . Adhesive [Tape] Hives  . Ciprofloxacin     Unknown   . Codeine     Unknown    . Iodine     Unknown    . Latex     Unknown    . Povidone-Iodine     Unknown    . Simvastatin     Unknown      Medications Prior to Admission  Medication Sig Dispense Refill  . acetaminophen (TYLENOL) 325 MG tablet Take 650 mg by mouth every 4 (four) hours as needed for mild pain or moderate pain. Take 2 tablets 1 hour prior to Rituxan treatment.      [name] amLODipine (NORVASC)  5 MG tablet Take 5 mg by mouth daily with breakfast.      . atorvastatin (LIPITOR) 40 MG tablet Take 1 tablet (40 mg total) by mouth every morning.  30 tablet  0  . Cyanocobalamin (VITAMIN B-12 IJ) Inject as directed every 30 (thirty) days.       . diazepam (VALIUM) 5 MG tablet Take 5 mg by mouth every 6 (six) hours as needed (sleep). Take one tablet at bedtime if needed for sleep.      . diphenhydrAMINE (BENADRYL) 25 MG tablet Take 25 mg by mouth daily as needed. Take 2 tablets 1 hour prior to Rituxan treatment.      [name] escitalopram (LEXAPRO) 10 MG tablet Take 10 mg by mouth every morning.      . insulin glargine (LANTUS SOLOSTAR) 100 UNIT/ML injection Inject 50 Units into the skin every morning.       . lidocaine-prilocaine (EMLA) cream Apply 1 application topically daily as needed  (chemo). Apply a quarter size amount to port site 1 hour prior to chemo. Do not rub in. Cover with plastic wrap.      . lisinopril (PRINIVIL,ZESTRIL) 40 MG tablet Take 40 mg by mouth daily with breakfast.      . LORazepam (ATIVAN) 0.5 MG tablet Take one tablet every 4 hours as needed for nausea.  60 tablet  2  . metFORMIN (GLUCOPHAGE) 1000 MG tablet Take 1,000 mg by mouth 2 (two) times daily with a meal.       . metoCLOPramide (REGLAN) 10 MG tablet Take one or two tablets 4 times a day before meals and at bedtime as needed for nausea.  60 tablet  4  . pantoprazole (PROTONIX) 40 MG tablet Take 1 tablet (40 mg total) by mouth daily.  30 tablet  11  . peg 3350 powder (MOVIPREP) 100 G SOLR Take 1 kit (200 g total) by mouth once.  1 kit  0  . prochlorperazine (COMPAZINE) 10 MG tablet Take 1 tablet (10 mg total) by mouth every 6 (six) hours as needed for nausea or vomiting.  60 tablet  3  . traMADol (ULTRAM) 50 MG tablet Take 1 tablet (50 mg total) by mouth every 6 (six) hours as needed.  60 tablet  0    Results for orders placed during the hospital encounter of 02/28/14 (from the past 48 hour(s))  GLUCOSE, CAPILLARY     Status: Abnormal   Collection Time    02/28/14  7:02 AM      Result Value Ref Range   Glucose-Capillary 124 (*) 70 - 99 mg/dL   No results found.  ROS  Blood pressure 133/90, pulse 87, temperature 98.4 F (36.9 C), temperature source Oral, resp. rate 18, height 5' 11"  (1.803 m), weight 217 lb (98.431 kg), SpO2 98.00%. Physical Exam  Constitutional: He appears well-developed and well-nourished.  HENT:  Mouth/Throat: Oropharynx is clear and moist.  Eyes: Conjunctivae are normal. No scleral icterus.  Neck: No thyromegaly present.  Cardiovascular: Normal rate, regular rhythm and normal heart sounds.   No murmur heard. Respiratory: Effort normal and breath sounds normal.  GI: Soft. He exhibits no mass. Tenderness: mild tenderness at LLQ.  Musculoskeletal: He exhibits no edema.   Lymphadenopathy:    He has no cervical adenopathy.  Neurological: He is alert.  Skin: Skin is warm and dry.     Assessment/Plan History of mantle cell lymphoma of colon. Colonoscopy to restage disease or confirm remission.  Rogene Houston 02/28/2014, 7:36  AM    

## 2014-02-28 NOTE — Discharge Instructions (Signed)
Resume usual medications and diet. No driving for 24 hours. Physician will call with biopsy results.  Colonoscopy, Care After Refer to this sheet in the next few weeks. These instructions provide you with information on caring for yourself after your procedure. Your health care provider may also give you more specific instructions. Your treatment has been planned according to current medical practices, but problems sometimes occur. Call your health care provider if you have any problems or questions after your procedure. WHAT TO EXPECT AFTER THE PROCEDURE  After your procedure, it is typical to have the following:  A small amount of blood in your stool.  Moderate amounts of gas and mild abdominal cramping or bloating. HOME CARE INSTRUCTIONS  Do not drive, operate machinery, or sign important documents for 24 hours.  You may shower and resume your regular physical activities, but move at a slower pace for the first 24 hours.  Take frequent rest periods for the first 24 hours.  Walk around or put a warm pack on your abdomen to help reduce abdominal cramping and bloating.  Drink enough fluids to keep your urine clear or pale yellow.  You may resume your normal diet as instructed by your health care provider. Avoid heavy or fried foods that are hard to digest.  Avoid drinking alcohol for 24 hours or as instructed by your health care provider.  Only take over-the-counter or prescription medicines as directed by your health care provider.  If a tissue sample (biopsy) was taken during your procedure:  Do not take aspirin or blood thinners for 7 days, or as instructed by your health care provider.  Do not drink alcohol for 7 days, or as instructed by your health care provider.  Eat soft foods for the first 24 hours. SEEK MEDICAL CARE IF: You have persistent spotting of blood in your stool 2 3 days after the procedure. SEEK IMMEDIATE MEDICAL CARE IF:  You have more than a small  spotting of blood in your stool.  You pass large blood clots in your stool.  Your abdomen is swollen (distended).  You have nausea or vomiting.  You have a fever.  You have increasing abdominal pain that is not relieved with medicine. Document Released: 04/23/2004 Document Revised: 06/30/2013 Document Reviewed: 05/17/2013 [employer] Patient Information 2014 [place].

## 2014-02-28 NOTE — Op Note (Signed)
COLONOSCOPY PROCEDURE REPORT  PATIENT:  [name]  MR#:  [id number] Birthdate:  [birth year], 79 y.o., male Endoscopist:  Dr. Rogene Houston, MD Referred By:  Dr. Farrel Gobble, MD Procedure Date: 02/28/2014  Procedure:   Colonoscopy  Indications:  Patient is 31 old Caucasian male who was diagnosed with mantle cell lymphoma of colon in December 2014. He underwent colonoscopy in March 2015 0.4 cycles of chemotherapy and still had some residual disease. He has received 2 more cycles of chemotherapy and not returning for evaluation. He denies abdominal pain rectal bleeding or diarrhea. He has history of ulcerative colitis and has remained in remission.  Informed Consent:  The procedure and risks were reviewed with the patient and informed consent was obtained.  Medications:  Demerol 50 mg IV Versed 9 mg IV  Description of procedure:  After a digital rectal exam was performed, that colonoscope was advanced from the anus through the rectum and colon to the area of the cecum, ileocecal valve and appendiceal orifice. The cecum was deeply intubated. These structures were well-seen and photographed for the record. From the level of the cecum and ileocecal valve, the scope was slowly and cautiously withdrawn. The mucosal surfaces were carefully surveyed utilizing scope tip to flexion to facilitate fold flattening as needed. The scope was pulled down into the rectum where a thorough exam including retroflexion was performed.  Findings:   Prep excellent. Normal mucosa of  cecum, ascending colon, hepatic flexure, transverse colon, splenic flexure descending and sigmoid colon. Few small diverticula noted sigmoid colon. Normal rectal mucosa. Small hemorrhoids below the dentate line and single anal papilla.   Therapeutic/Diagnostic Maneuvers Performed:  Multiple biopsies taken from proximal and distal half of the colon and submitted separately.  Complications:  None  Cecal Withdrawal Time:   21 minutes  Impression:  Examination performed to cecum. No mucosal nodularity or other abnormalities noted. Multiple biopsies taken from proximal and distal colon and submitted separately. Mild sigmoid colon diverticulosis. Small external hemorrhoids and anal papilla.   Recommendations:  Standard instructions given. I will contact patient with biopsy results and further recommendations.  Rogene Houston  02/28/2014 8:18 AM  CC: Dr. [name], MD & Dr. Rayne Du ref. provider found

## 2014-03-02 ENCOUNTER — Encounter (HOSPITAL_COMMUNITY): Payer: Self-pay | Admitting: Internal Medicine

## 2014-03-04 DIAGNOSIS — L0390 Cellulitis, unspecified: Secondary | ICD-10-CM | POA: Diagnosis not present

## 2014-03-04 DIAGNOSIS — L0291 Cutaneous abscess, unspecified: Secondary | ICD-10-CM | POA: Diagnosis not present

## 2014-03-07 ENCOUNTER — Encounter (HOSPITAL_COMMUNITY): Payer: Medicare Other | Attending: Hematology and Oncology

## 2014-03-07 VITALS — BP 113/65 | HR 83 | Temp 97.7°F | Resp 16 | Wt 212.6 lb

## 2014-03-07 DIAGNOSIS — C8319 Mantle cell lymphoma, extranodal and solid organ sites: Secondary | ICD-10-CM | POA: Insufficient documentation

## 2014-03-07 DIAGNOSIS — C8310 Mantle cell lymphoma, unspecified site: Secondary | ICD-10-CM

## 2014-03-07 DIAGNOSIS — D61818 Other pancytopenia: Secondary | ICD-10-CM

## 2014-03-07 DIAGNOSIS — Z8546 Personal history of malignant neoplasm of prostate: Secondary | ICD-10-CM | POA: Diagnosis not present

## 2014-03-07 LAB — COMPREHENSIVE METABOLIC PANEL
ALBUMIN: 3.8 g/dL (ref 3.5–5.2)
ALT: 18 U/L (ref 0–53)
AST: 24 U/L (ref 0–37)
Alkaline Phosphatase: 49 U/L (ref 39–117)
BILIRUBIN TOTAL: 0.4 mg/dL (ref 0.3–1.2)
BUN: 22 mg/dL (ref 6–23)
CHLORIDE: 103 meq/L (ref 96–112)
CO2: 24 meq/L (ref 19–32)
Calcium: 8.6 mg/dL (ref 8.4–10.5)
Creatinine, Ser: 1.45 mg/dL — ABNORMAL HIGH (ref 0.50–1.35)
GFR calc Af Amer: 52 mL/min — ABNORMAL LOW (ref >90)
GFR, EST NON AFRICAN AMERICAN: 45 mL/min — AB (ref >90)
Glucose, Bld: 135 mg/dL — ABNORMAL HIGH (ref 70–99)
Potassium: 4.7 mEq/L (ref 3.7–5.3)
SODIUM: 142 meq/L (ref 137–147)
Total Protein: 6.5 g/dL (ref 6.0–8.3)

## 2014-03-07 LAB — CBC WITH DIFFERENTIAL/PLATELET
BASOS ABS: 0 10*3/uL (ref 0.0–0.1)
BASOS PCT: 1 % (ref 0–1)
Eosinophils Absolute: 0 10*3/uL (ref 0.0–0.7)
Eosinophils Relative: 0 % (ref 0–5)
HEMATOCRIT: 33.4 % — AB (ref 39.0–52.0)
Hemoglobin: 11.8 g/dL — ABNORMAL LOW (ref 13.0–17.0)
LYMPHS PCT: 28 % (ref 12–46)
Lymphs Abs: 0.9 10*3/uL (ref 0.7–4.0)
MCH: 32.5 pg (ref 26.0–34.0)
MCHC: 35.3 g/dL (ref 30.0–36.0)
MCV: 92 fL (ref 78.0–100.0)
Monocytes Absolute: 0.3 10*3/uL (ref 0.1–1.0)
Monocytes Relative: 8 % (ref 3–12)
NEUTROS ABS: 2 10*3/uL (ref 1.7–7.7)
NEUTROS PCT: 63 % (ref 43–77)
PLATELETS: 104 10*3/uL — AB (ref 150–400)
RBC: 3.63 MIL/uL — ABNORMAL LOW (ref 4.22–5.81)
RDW: 14.8 % (ref 11.5–15.5)
WBC: 3.1 10*3/uL — AB (ref 4.0–10.5)

## 2014-03-07 LAB — LACTATE DEHYDROGENASE: LDH: 237 U/L (ref 94–250)

## 2014-03-07 MED ORDER — CYANOCOBALAMIN 1000 MCG/ML IJ SOLN
INTRAMUSCULAR | Status: AC
  Filled 2014-03-07: qty 1

## 2014-03-07 MED ORDER — CYANOCOBALAMIN 1000 MCG/ML IJ SOLN
1000.0000 ug | Freq: Once | INTRAMUSCULAR | Status: AC
  Administered 2014-03-07: 1000 ug via INTRAMUSCULAR

## 2014-03-07 MED ORDER — ESCITALOPRAM OXALATE 10 MG PO TABS: ORAL_TABLET | ORAL | Status: DC

## 2014-03-07 NOTE — Progress Notes (Signed)
[name] presents today for OV and injection per the provider's orders.  Vitamin B12 administration without incident; see MAR for injection details.  Patient tolerated procedure well and without incident.  No questions or complaints noted at this time.

## 2014-03-07 NOTE — Patient Instructions (Signed)
[facility] Discharge Instructions  RECOMMENDATIONS MADE BY THE CONSULTANT AND ANY TEST RESULTS WILL BE SENT TO YOUR REFERRING PHYSICIAN.  EXAM FINDINGS BY THE PHYSICIAN TODAY AND SIGNS OR SYMPTOMS TO REPORT TO CLINIC OR PRIMARY PHYSICIAN: Exam and findings as discussed by Dr. Barnet Glasgow.  INSTRUCTIONS/FOLLOW-UP: 1.  Return monthly for your Vitamin B12 injections. 2.  Return in 3 months for another office visit and your maintenance Rituxan infusion.  You will also receive labs and your B12 injection the same day.  Thank you for choosing [facility] to provide your oncology and hematology care.  To afford each patient quality time with our providers, please arrive at least 15 minutes before your scheduled appointment time.  With your help, our goal is to use those 15 minutes to complete the necessary work-up to ensure our physicians have the information they need to help with your evaluation and healthcare recommendations.    Effective January 1st, 2014, we ask that you re-schedule your appointment with our physicians should you arrive 10 or more minutes late for your appointment.  We strive to give you quality time with our providers, and arriving late affects you and other patients whose appointments are after yours.    Again, thank you for choosing [facility].  Our hope is that these requests will decrease the amount of time that you wait before being seen by our physicians.       _____________________________________________________________  Should you have questions after your visit to [facility], please contact our office at [phone] between the hours of 8:30 a.m. and 5:00 p.m.  Voicemails left after 4:30 p.m. will not be returned until the following business day.  For prescription refill requests, have your pharmacy contact our office with your prescription refill request.

## 2014-03-07 NOTE — Progress Notes (Signed)
Labs drawn for cmp, cbc/diff, beta 2 micoglobulin, ldh.

## 2014-03-07 NOTE — Progress Notes (Signed)
[facility]  OFFICE PROGRESS NOTE  Blakely, Thedore Mins, MD  [place] [address]  DIAGNOSIS: Mantle cell lymphoma - Plan: CBC with Differential, Reticulocytes, Comprehensive metabolic panel, Lactate dehydrogenase, Beta 2 microglobuline, serum, MOVIPREP 100 G SOLR, mupirocin ointment (BACTROBAN) 2 %, cyanocobalamin ((VITAMIN B-12)) injection 1,000 mcg, escitalopram (LEXAPRO) 10 MG tablet, CBC with Differential, Comprehensive metabolic panel, Beta 2 microglobulin, serum, Lactate dehydrogenase, CBC with Differential, Comprehensive metabolic panel, Beta 2 microglobulin, serum, Lactate dehydrogenase  No chief complaint on file.   CURRENT THERAPY: Bendamustine/Rituxan for 6 cycles begun 09/14/2013.  INTERVAL HISTORY: [name] 79 y.o. male returns for followup of mantle cell lymphoma diagnosed by colonoscopic biopsy with negative CT scans of chest abdomen and pelvis, status post 6 cycles of bendamustine/Rituxan with last treatment cycle started on 02/07/2014. Repeat colonoscopy done on 02/28/2014 showed no evidence of inflammation or lymphoma. He does experience occasional bowel pain without nausea, vomiting, diarrhea, melena, hematochezia, hematuria, urinary hesitancy, fever, night sweats, sore throat, cough, wheezing, headache, skin rash, joint pain, or seizures.  MEDICAL HISTORY: Past Medical History  Diagnosis Date  . Type 2 diabetes mellitus   . 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 Mixed hyperlipidemia   . PVD (peripheral vascular disease)   . Essential hypertension, benign   . Prostate cancer   . Colon cancer   . B12 deficiency 02/07/2014  . Obstructive sleep apnea     does not use  . GERD (gastroesophageal reflux disease)     INTERIM HISTORY: has Mixed hyperlipidemia; HYPERTENSION, BENIGN; Coronary atherosclerosis of native  coronary artery; VASOMOTOR RHINITIS; GERD; OSA (obstructive sleep apnea); H/O ulcerative colitis; Mantle cell lymphoma; Prostate cancer; Proctitis, radiation; Abdominal pain, left lower quadrant; Abdominal pain, unspecified site; Lower GI bleed; Diverticulitis; Leukopenia; Diverticulosis of colon with hemorrhage; Thrombocytopenia, unspecified; Acute blood loss anemia; and B12 deficiency on his problem list.     Mantle cell lymphoma    08/27/2013  Initial Diagnosis  Colon, biopsy, random - MANTLE CELL LYMPHOMA    09/06/2013  Imaging  CT CAP- No significant lymphadenopathy identified within the chest, abdomen or pelvis.    09/14/2013 -  Chemotherapy  Bendamustine/Rituxan x 6 cycles     ALLERGIES:  is allergic to bee venom; adhesive; ciprofloxacin; codeine; iodine; latex; povidone-iodine; and simvastatin.  MEDICATIONS: has a current medication list which includes the following prescription(s): acetaminophen, amlodipine, atorvastatin, cyanocobalamin, diphenhydramine, escitalopram, insulin glargine, lisinopril, metformin, metoclopramide, moviprep, mupirocin ointment, pantoprazole, prochlorperazine, tramadol, diazepam, lidocaine-prilocaine, and lorazepam.  SURGICAL HISTORY:  Past Surgical History  Procedure Laterality Date  . Tonsillectomy    . Back surgery    . Vasectomy    . Knee surgery Right     total knee  . Shoulder surgery    . Prostatectomy    . Colonoscopy with esophagogastroduodenoscopy (egd) N/A 08/27/2013    Procedure: COLONOSCOPY WITH ESOPHAGOGASTRODUODENOSCOPY (EGD);  Surgeon: Rogene Houston, MD;  [facility];  Service: Endoscopy;  Laterality: N/A;  730  . Balloon dilation N/A 08/27/2013    Procedure: BALLOON DILATION;  Surgeon: Rogene Houston, MD;  [facility];  Service: Endoscopy;  Laterality: N/A;  Venia Minks dilation N/A 08/27/2013    Procedure: Venia Minks DILATION;  Surgeon: Rogene Houston, MD;  [facility];  Service: Endoscopy;  Laterality: N/A;   .  Savory dilation N/A 08/27/2013    Procedure: SAVORY DILATION;  Surgeon: Rogene Houston, MD;  [facility];  Service: Endoscopy;  Laterality: N/A;  . Portacath placement Right 2014  . Colonoscopy N/A 12/17/2013    Procedure: COLONOSCOPY;  Surgeon: Rogene Houston, MD;  [facility];  Service: Endoscopy;  Laterality: N/A;  940  . Colon surgery    . Knee arthroscopy Left   . Removal of port Right   . Colonoscopy N/A 02/28/2014    Procedure: COLONOSCOPY;  Surgeon: Rogene Houston, MD;  [facility];  Service: Endoscopy;  Laterality: N/A;  730    FAMILY HISTORY: family history includes Cancer in his brother and brother; Colon cancer in his brother; Diabetes in his father.  SOCIAL HISTORY:  reports that he has never smoked. He has never used smokeless tobacco. He reports that he does not drink alcohol or use illicit drugs.  REVIEW OF SYSTEMS:  Other than that discussed above is noncontributory.  PHYSICAL EXAMINATION: ECOG PERFORMANCE STATUS: 1 - Symptomatic but completely ambulatory  Blood pressure 113/65, pulse 83, temperature 97.7 F (36.5 C), temperature source Oral, resp. rate 16, weight 212 lb 9.6 oz (96.435 kg).  GENERAL:alert, no distress and comfortable SKIN: skin color, texture, turgor are normal, no rashes or significant lesions EYES: PERLA; Conjunctiva are pink and non-injected, sclera clear SINUSES: No redness or tenderness over maxillary or ethmoid sinuses OROPHARYNX:no exudate, no erythema on lips, buccal mucosa, or tongue. NECK: supple, thyroid normal size, non-tender, without nodularity. No masses CHEST: Normal AP diameter with light port in place. LYMPH:  no palpable lymphadenopathy in the cervical, axillary or inguinal LUNGS: clear to auscultation and percussion with normal breathing effort HEART: regular rate & rhythm and no murmurs. ABDOMEN:abdomen soft, non-tender and normal bowel sounds MUSCULOSKELETAL:no cyanosis of digits and no  clubbing. Range of motion normal.  NEURO: alert & oriented x 3 with fluent speech, no focal motor/sensory deficits   LABORATORY DATA: Office Visit on 03/07/2014  Component Date Value Ref Range Status  . WBC 03/07/2014 3.1* 4.0 - 10.5 K/uL Final  . RBC 03/07/2014 3.63* 4.22 - 5.81 MIL/uL Final  . Hemoglobin 03/07/2014 11.8* 13.0 - 17.0 g/dL Final  . HCT 03/07/2014 33.4* 39.0 - 52.0 % Final  . MCV 03/07/2014 92.0  78.0 - 100.0 fL Final  . MCH 03/07/2014 32.5  26.0 - 34.0 pg Final  . MCHC 03/07/2014 35.3  30.0 - 36.0 g/dL Final  . RDW 03/07/2014 14.8  11.5 - 15.5 % Final  . Platelets 03/07/2014 104* 150 - 400 K/uL Final   Comment: SPECIMEN CHECKED FOR CLOTS                          CONSISTENT WITH PREVIOUS RESULT  . Neutrophils Relative % 03/07/2014 63  43 - 77 % Final  . Neutro Abs 03/07/2014 2.0  1.7 - 7.7 K/uL Final  . Lymphocytes Relative 03/07/2014 28  12 - 46 % Final  . Lymphs Abs 03/07/2014 0.9  0.7 - 4.0 K/uL Final  . Monocytes Relative 03/07/2014 8  3 - 12 % Final  . Monocytes Absolute 03/07/2014 0.3  0.1 - 1.0 K/uL Final  . Eosinophils Relative 03/07/2014 0  0 - 5 % Final  . Eosinophils Absolute 03/07/2014 0.0  0.0 - 0.7 K/uL Final  . Basophils Relative 03/07/2014 1  0 - 1 % Final  . Basophils Absolute 03/07/2014 0.0  0.0 -  0.1 K/uL Final  Admission on 02/28/2014, Discharged on 02/28/2014  Component Date Value Ref Range Status  . Glucose-Capillary 02/28/2014 124* 70 - 99 mg/dL Final  Infusion on 02/07/2014  Component Date Value Ref Range Status  . WBC 02/07/2014 2.4* 4.0 - 10.5 K/uL Final  . RBC 02/07/2014 3.61* 4.22 - 5.81 MIL/uL Final  . Hemoglobin 02/07/2014 11.5* 13.0 - 17.0 g/dL Final  . HCT 02/07/2014 33.8* 39.0 - 52.0 % Final  . MCV 02/07/2014 93.6  78.0 - 100.0 fL Final  . MCH 02/07/2014 31.9  26.0 - 34.0 pg Final  . MCHC 02/07/2014 34.0  30.0 - 36.0 g/dL Final  . RDW 02/07/2014 14.5  11.5 - 15.5 % Final  . Platelets 02/07/2014 111* 150 - 400 K/uL Final    Comment: SPECIMEN CHECKED FOR CLOTS                          PLATELET COUNT CONFIRMED BY SMEAR  . Neutrophils Relative % 02/07/2014 62  43 - 77 % Final  . Neutro Abs 02/07/2014 1.5* 1.7 - 7.7 K/uL Final  . Lymphocytes Relative 02/07/2014 20  12 - 46 % Final  . Lymphs Abs 02/07/2014 0.5* 0.7 - 4.0 K/uL Final  . Monocytes Relative 02/07/2014 15* 3 - 12 % Final  . Monocytes Absolute 02/07/2014 0.4  0.1 - 1.0 K/uL Final  . Eosinophils Relative 02/07/2014 3  0 - 5 % Final  . Eosinophils Absolute 02/07/2014 0.1  0.0 - 0.7 K/uL Final  . Basophils Relative 02/07/2014 1  0 - 1 % Final  . Basophils Absolute 02/07/2014 0.0  0.0 - 0.1 K/uL Final  . Sodium 02/07/2014 142  137 - 147 mEq/L Final  . Potassium 02/07/2014 4.5  3.7 - 5.3 mEq/L Final  . Chloride 02/07/2014 105  96 - 112 mEq/L Final  . CO2 02/07/2014 26  19 - 32 mEq/L Final  . Glucose, Bld 02/07/2014 224* 70 - 99 mg/dL Final  . BUN 02/07/2014 23  6 - 23 mg/dL Final  . Creatinine, Ser 02/07/2014 1.20  0.50 - 1.35 mg/dL Final  . Calcium 02/07/2014 9.0  8.4 - 10.5 mg/dL Final  . Total Protein 02/07/2014 6.1  6.0 - 8.3 g/dL Final  . Albumin 02/07/2014 3.7  3.5 - 5.2 g/dL Final  . AST 02/07/2014 24  0 - 37 U/L Final  . ALT 02/07/2014 18  0 - 53 U/L Final  . Alkaline Phosphatase 02/07/2014 49  39 - 117 U/L Final  . Total Bilirubin 02/07/2014 0.5  0.3 - 1.2 mg/dL Final  . GFR calc non Af Amer 02/07/2014 57* >90 mL/min Final  . GFR calc Af Amer 02/07/2014 66* >90 mL/min Final   Comment: (NOTE)                          The eGFR has been calculated using the CKD EPI equation.                          This calculation has not been validated in all clinical situations.                          eGFR's persistently <90 mL/min signify possible Chronic Kidney  Disease.  [name] LDH 02/07/2014 230  94 - 250 U/L Final  . Beta-2 Microglobulin 02/07/2014 5.29* <=2.51 mg/L Final   Performed at [place]:  FINAL for [name], [name] ([id number]) Patient: [name], [name] Collected: 02/28/2014 Client: [facility] Accession: [id number] Received: 02/28/2014 [name] DOB: 07-10-1935 Age: 39 Gender: M Reported: 03/01/2014 [address] Patient Ph: [phone] MRN #: [id number] Linna Hoff [place] [address] Visit #: [id number] Chart #: Phone: [phone] Fax: CC: REPORT OF SURGICAL PATHOLOGY FINAL DIAGNOSIS Diagnosis 1. Colon, biopsy, proximal - BENIGN COLONIC MUCOSA. - NO SIGNIFICANT INFLAMMATION OR OTHER ABNORMALITIES IDENTIFIED. - NO EVIDENCE OF LYMPHOMA, ADENOMATOUS CHANGES OR MALIGNANCY. 2. Colon, biopsy, distal - BENIGN COLONIC MUCOSA. - NO SIGNIFICANT INFLAMMATION OR OTHER ABNORMALITIES IDENTIFIED. - NO EVIDENCE OF LYMPHOMA, ADENOMATOUS CHANGES OR MALIGNANCY. Microscopic Comment 2. There is colorectal mucosa with normal crypt architecture and no objective increase in inflammation. No active inflammation, microscopic colitis, collagenous colitis or significant chronic change is identified. No hyperplastic or adenomatous changes are seen, and there is no evidence of malignancy. Aldona Bar MD Pathologist, Electronic Signature (Case signed 03/01/2014) Specimen Gross and Clinical Information Specimen(s) Obtained: 1. Colon, biopsy, proximal 2. Colon, biopsy, distal Specimen Clinical Information 2. Pre-op: mantle cell lipoma; Post-op: diverticulitis, proximal and distal colon bx; internal hemorrhoids at papilla Gross 1. Received in formalin are tan, soft tissue fragments that are submitted in toto. Number: 10, Size: 0.1 to 0.4 cm (Aggregate measurement: 2.0 x 1.5 x 0.4 cm)(1B) 2. Received in formalin are tan, soft tissue fragments that are submitted in toto. Number: nine, Size: 0.1 to 0.6 cm (Aggregate measurement: 2.0 x 1.7 x 0.4 cm)(1B) (TB:kh 02-28-14) 1 of 2 FINAL for [name], [name] [phone]) Report signed out from the following  location(s) Technical Component performed at [facility]. [address],[place] [id number].[id number]., Interpretation performed at [place], [place], [place] [address]. CLIA #: [id number],    Urinalysis    Component Value Date/Time   COLORURINE YELLOW 09/19/2007 1940   APPEARANCEUR CLEAR 09/19/2007 1940   LABSPEC 1.020 09/19/2007 1940   PHURINE 5.5 09/19/2007 1940   GLUCOSEU >1000* 09/19/2007 1940   HGBUR TRACE* 09/19/2007 1940   BILIRUBINUR NEGATIVE 09/19/2007 1940   KETONESUR NEGATIVE 09/19/2007 1940   PROTEINUR NEGATIVE 09/19/2007 1940   UROBILINOGEN 0.2 09/19/2007 1940   NITRITE NEGATIVE 09/19/2007 1940   LEUKOCYTESUR NEGATIVE 09/19/2007 1940    RADIOGRAPHIC STUDIES: No results found.  ASSESSMENT:  1.Stage I-E. mantle cell lymphoma involving the colon, normal CT scans initially, normal colonoscopy performed on 02/28/2014 with original diagnosis made by colonoscopy only. 2. Ulcerative colitis, improved.  3. Depression and anxiety disorder, much improved.  4. Past history of prostate cancer  5. Tremors associated with Compazine.  6. Pancytopenia secondary to previous chemotherapy.      PLAN:  #1. Maintenance Rituxan to begin on 05/10/2014 giving 500 mg per meter squared IV every 3 months for 2 years. #2. Lexapro one over two tablets daily to control anxiety and depression. #3. Followup on 05/10/2014 with CBC, chem profile, LDH, beta-2 microglobulin, and initiation of Rituxan maintenance.   All questions were answered. The patient knows to call the clinic with any problems, questions or concerns. We can certainly see the patient much sooner if necessary.   I spent 25 minutes counseling the patient face to face. The total time spent in the appointment was 30 minutes.    Doroteo Bradford, MD  03/07/2014 12:33 PM  DISCLAIMER:  This note was dictated with voice recognition software.  Similar  sounding words can inadvertently be transcribed inaccurately and may not be corrected upon review.

## 2014-03-09 LAB — BETA 2 MICROGLOBULIN, SERUM: Beta-2 Microglobulin: 5.82 mg/L — ABNORMAL HIGH (ref <=2.51)

## 2014-03-10 ENCOUNTER — Encounter (INDEPENDENT_AMBULATORY_CARE_PROVIDER_SITE_OTHER): Payer: Self-pay | Admitting: *Deleted

## 2014-03-14 DIAGNOSIS — Z96659 Presence of unspecified artificial knee joint: Secondary | ICD-10-CM | POA: Diagnosis not present

## 2014-03-14 DIAGNOSIS — T8489XA Other specified complication of internal orthopedic prosthetic devices, implants and grafts, initial encounter: Secondary | ICD-10-CM | POA: Diagnosis not present

## 2014-03-16 ENCOUNTER — Ambulatory Visit (HOSPITAL_COMMUNITY): Payer: Medicare Other | Admitting: Oncology

## 2014-03-29 DIAGNOSIS — Z96651 Presence of right artificial knee joint: Secondary | ICD-10-CM | POA: Insufficient documentation

## 2014-03-29 DIAGNOSIS — Z96659 Presence of unspecified artificial knee joint: Secondary | ICD-10-CM | POA: Diagnosis not present

## 2014-03-29 DIAGNOSIS — M25569 Pain in unspecified knee: Secondary | ICD-10-CM | POA: Diagnosis not present

## 2014-04-05 ENCOUNTER — Ambulatory Visit (HOSPITAL_COMMUNITY): Payer: Medicare Other

## 2014-04-05 ENCOUNTER — Encounter (HOSPITAL_COMMUNITY): Payer: Medicare Other | Attending: Hematology and Oncology

## 2014-04-05 VITALS — BP 132/75 | HR 79 | Temp 97.9°F | Resp 20

## 2014-04-05 DIAGNOSIS — C8319 Mantle cell lymphoma, extranodal and solid organ sites: Secondary | ICD-10-CM | POA: Insufficient documentation

## 2014-04-05 DIAGNOSIS — E538 Deficiency of other specified B group vitamins: Secondary | ICD-10-CM | POA: Diagnosis not present

## 2014-04-05 DIAGNOSIS — C8310 Mantle cell lymphoma, unspecified site: Secondary | ICD-10-CM

## 2014-04-05 MED ORDER — CYANOCOBALAMIN 1000 MCG/ML IJ SOLN
INTRAMUSCULAR | Status: AC
  Filled 2014-04-05: qty 1

## 2014-04-05 MED ORDER — CYANOCOBALAMIN 1000 MCG/ML IJ SOLN
1000.0000 ug | Freq: Once | INTRAMUSCULAR | Status: AC
  Administered 2014-04-05: 1000 ug via INTRAMUSCULAR

## 2014-04-05 NOTE — Progress Notes (Signed)
[name] presents today for injection per MD orders. B12 1000 mcg administered IM in left Upper Arm. Administration without incident. Patient tolerated well.

## 2014-04-06 ENCOUNTER — Ambulatory Visit (HOSPITAL_COMMUNITY): Payer: Medicare Other

## 2014-04-07 ENCOUNTER — Ambulatory Visit (HOSPITAL_COMMUNITY): Payer: Medicare Other

## 2014-04-26 DIAGNOSIS — E538 Deficiency of other specified B group vitamins: Secondary | ICD-10-CM | POA: Diagnosis not present

## 2014-04-26 DIAGNOSIS — R5381 Other malaise: Secondary | ICD-10-CM | POA: Diagnosis not present

## 2014-04-26 DIAGNOSIS — Z8546 Personal history of malignant neoplasm of prostate: Secondary | ICD-10-CM | POA: Diagnosis not present

## 2014-04-26 DIAGNOSIS — E291 Testicular hypofunction: Secondary | ICD-10-CM | POA: Diagnosis not present

## 2014-04-26 DIAGNOSIS — N529 Male erectile dysfunction, unspecified: Secondary | ICD-10-CM | POA: Diagnosis not present

## 2014-04-26 DIAGNOSIS — N393 Stress incontinence (female) (male): Secondary | ICD-10-CM | POA: Diagnosis not present

## 2014-04-26 DIAGNOSIS — N35919 Unspecified urethral stricture, male, unspecified site: Secondary | ICD-10-CM | POA: Diagnosis not present

## 2014-05-03 DIAGNOSIS — Z96659 Presence of unspecified artificial knee joint: Secondary | ICD-10-CM | POA: Diagnosis not present

## 2014-05-09 DIAGNOSIS — E291 Testicular hypofunction: Secondary | ICD-10-CM | POA: Diagnosis not present

## 2014-05-09 DIAGNOSIS — E119 Type 2 diabetes mellitus without complications: Secondary | ICD-10-CM | POA: Diagnosis not present

## 2014-05-09 DIAGNOSIS — I10 Essential (primary) hypertension: Secondary | ICD-10-CM | POA: Diagnosis not present

## 2014-05-10 ENCOUNTER — Encounter (HOSPITAL_COMMUNITY): Payer: Self-pay

## 2014-05-10 ENCOUNTER — Encounter (HOSPITAL_COMMUNITY): Payer: Medicare Other | Attending: Hematology and Oncology

## 2014-05-10 ENCOUNTER — Inpatient Hospital Stay (HOSPITAL_COMMUNITY): Payer: Medicare Other

## 2014-05-10 ENCOUNTER — Encounter (HOSPITAL_BASED_OUTPATIENT_CLINIC_OR_DEPARTMENT_OTHER): Payer: Medicare Other

## 2014-05-10 VITALS — BP 122/60 | HR 77 | Temp 97.8°F | Resp 18 | Wt 221.5 lb

## 2014-05-10 VITALS — BP 131/82 | HR 68 | Temp 97.8°F | Resp 16

## 2014-05-10 DIAGNOSIS — G4733 Obstructive sleep apnea (adult) (pediatric): Secondary | ICD-10-CM | POA: Diagnosis not present

## 2014-05-10 DIAGNOSIS — E538 Deficiency of other specified B group vitamins: Secondary | ICD-10-CM

## 2014-05-10 DIAGNOSIS — Z5112 Encounter for antineoplastic immunotherapy: Secondary | ICD-10-CM | POA: Diagnosis not present

## 2014-05-10 DIAGNOSIS — C8319 Mantle cell lymphoma, extranodal and solid organ sites: Secondary | ICD-10-CM | POA: Diagnosis not present

## 2014-05-10 DIAGNOSIS — C61 Malignant neoplasm of prostate: Secondary | ICD-10-CM | POA: Diagnosis not present

## 2014-05-10 DIAGNOSIS — C8310 Mantle cell lymphoma, unspecified site: Secondary | ICD-10-CM

## 2014-05-10 DIAGNOSIS — I10 Essential (primary) hypertension: Secondary | ICD-10-CM

## 2014-05-10 DIAGNOSIS — Z8719 Personal history of other diseases of the digestive system: Secondary | ICD-10-CM | POA: Diagnosis not present

## 2014-05-10 LAB — RETICULOCYTES
RBC.: 3.63 MIL/uL — ABNORMAL LOW (ref 4.22–5.81)
RETIC COUNT ABSOLUTE: 50.8 10*3/uL (ref 19.0–186.0)
Retic Ct Pct: 1.4 % (ref 0.4–3.1)

## 2014-05-10 LAB — CBC WITH DIFFERENTIAL/PLATELET
BASOS ABS: 0 10*3/uL (ref 0.0–0.1)
Basophils Relative: 0 % (ref 0–1)
EOS PCT: 2 % (ref 0–5)
Eosinophils Absolute: 0.1 10*3/uL (ref 0.0–0.7)
HCT: 33.9 % — ABNORMAL LOW (ref 39.0–52.0)
Hemoglobin: 11.8 g/dL — ABNORMAL LOW (ref 13.0–17.0)
LYMPHS PCT: 21 % (ref 12–46)
Lymphs Abs: 0.5 10*3/uL — ABNORMAL LOW (ref 0.7–4.0)
MCH: 32.5 pg (ref 26.0–34.0)
MCHC: 34.8 g/dL (ref 30.0–36.0)
MCV: 93.4 fL (ref 78.0–100.0)
MONOS PCT: 8 % (ref 3–12)
Monocytes Absolute: 0.2 10*3/uL (ref 0.1–1.0)
NEUTROS ABS: 1.8 10*3/uL (ref 1.7–7.7)
Neutrophils Relative %: 69 % (ref 43–77)
Platelets: 90 10*3/uL — ABNORMAL LOW (ref 150–400)
RBC: 3.63 MIL/uL — ABNORMAL LOW (ref 4.22–5.81)
RDW: 14.7 % (ref 11.5–15.5)
WBC: 2.6 10*3/uL — AB (ref 4.0–10.5)

## 2014-05-10 LAB — COMPREHENSIVE METABOLIC PANEL
ALK PHOS: 44 U/L (ref 39–117)
ALT: 16 U/L (ref 0–53)
AST: 17 U/L (ref 0–37)
Albumin: 3.9 g/dL (ref 3.5–5.2)
Anion gap: 12 (ref 5–15)
BILIRUBIN TOTAL: 0.7 mg/dL (ref 0.3–1.2)
BUN: 27 mg/dL — ABNORMAL HIGH (ref 6–23)
CHLORIDE: 106 meq/L (ref 96–112)
CO2: 26 mEq/L (ref 19–32)
Calcium: 8.9 mg/dL (ref 8.4–10.5)
Creatinine, Ser: 1.32 mg/dL (ref 0.50–1.35)
GFR, EST AFRICAN AMERICAN: 59 mL/min — AB (ref >90)
GFR, EST NON AFRICAN AMERICAN: 51 mL/min — AB (ref >90)
GLUCOSE: 145 mg/dL — AB (ref 70–99)
Potassium: 4.1 mEq/L (ref 3.7–5.3)
SODIUM: 144 meq/L (ref 137–147)
Total Protein: 6.5 g/dL (ref 6.0–8.3)

## 2014-05-10 LAB — LACTATE DEHYDROGENASE: LDH: 164 U/L (ref 94–250)

## 2014-05-10 MED ORDER — HEPARIN SOD (PORK) LOCK FLUSH 100 UNIT/ML IV SOLN
500.0000 [IU] | Freq: Once | INTRAVENOUS | Status: AC | PRN
  Administered 2014-05-10: 500 [IU]
  Filled 2014-05-10: qty 5

## 2014-05-10 MED ORDER — SODIUM CHLORIDE 0.9 % IV SOLN
Freq: Once | INTRAVENOUS | Status: AC
  Administered 2014-05-10: 09:00:00 via INTRAVENOUS

## 2014-05-10 MED ORDER — CYANOCOBALAMIN 1000 MCG/ML IJ SOLN
1000.0000 ug | Freq: Once | INTRAMUSCULAR | Status: AC
  Administered 2014-05-10: 1000 ug via INTRAMUSCULAR

## 2014-05-10 MED ORDER — SODIUM CHLORIDE 0.9 % IV SOLN
500.0000 mg/m2 | Freq: Once | INTRAVENOUS | Status: AC
  Administered 2014-05-10: 1100 mg via INTRAVENOUS
  Filled 2014-05-10: qty 110

## 2014-05-10 MED ORDER — SODIUM CHLORIDE 0.9 % IJ SOLN
10.0000 mL | INTRAMUSCULAR | Status: DC | PRN
  Administered 2014-05-10: 10 mL

## 2014-05-10 MED ORDER — CYANOCOBALAMIN 1000 MCG/ML IJ SOLN
INTRAMUSCULAR | Status: AC
  Filled 2014-05-10: qty 1

## 2014-05-10 MED ORDER — DIPHENHYDRAMINE HCL 25 MG PO CAPS
50.0000 mg | ORAL_CAPSULE | Freq: Once | ORAL | Status: AC
  Administered 2014-05-10: 50 mg via ORAL

## 2014-05-10 MED ORDER — DIPHENHYDRAMINE HCL 25 MG PO CAPS
ORAL_CAPSULE | ORAL | Status: AC
  Filled 2014-05-10: qty 2

## 2014-05-10 NOTE — Progress Notes (Signed)
[facility]  OFFICE PROGRESS NOTE  Cruger, [facility], MD  [place] [address]  DIAGNOSIS: Mantle cell lymphoma  Prostate cancer  H/O ulcerative colitis  OSA (obstructive sleep apnea)  B12 deficiency  Essential hypertension, benign  Chief Complaint  Patient presents with  . Mantle cell lymphoma    follow-up  . Maintenance Rituxan    Maintenance Rituxan cycle 1 today    CURRENT THERAPY: Cycle 1 of Rituxan maintenance therapy to begin today  INTERVAL HISTORY: [name] 79 y.o. male returns for initiation of maintenance Rituxan therapy after treatment with 6 cycles of bendamustine/Rituxan begun on 09/14/2013 for mantle cell lymphoma involving the large intestine. Imaging studies were all negative and repeat colonoscopy documented no evidence of inflammation or lymphoma on 02/28/2014.  Primary complaint is fatigue. He does not feel like he has enough energy to accomplish what he wishes. He denies any sleep disturbance. He is currently taking Lexapro 10 mg daily. Appetite is good with no nausea, vomiting, diarrhea, melena, hematochezia, hematuria, abdominal pain, cough, wheezing, PND, orthopnea, palpitations, lower extremity swelling or redness, skin rash, joint pain, headache, or seizures. He was evaluated by his urologist last week with no specific recommendations made regarding management of his prostate cancer.  MEDICAL HISTORY: Past Medical History  Diagnosis Date  . Type 2 diabetes mellitus   . 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 Mixed hyperlipidemia   . PVD (peripheral vascular disease)   . Essential hypertension, benign   . Prostate cancer   . Colon cancer   . B12 deficiency 02/07/2014  . Obstructive sleep apnea     does not use  . GERD (gastroesophageal reflux disease)     INTERIM HISTORY: has Mixed  hyperlipidemia; HYPERTENSION, BENIGN; Coronary atherosclerosis of native coronary artery; VASOMOTOR RHINITIS; GERD; OSA (obstructive sleep apnea); H/O ulcerative colitis; Mantle cell lymphoma; Prostate cancer; Proctitis, radiation; Abdominal pain, left lower quadrant; Abdominal pain, unspecified site; Lower GI bleed; Diverticulitis; Leukopenia; Diverticulosis of colon with hemorrhage; Thrombocytopenia, unspecified; Acute blood loss anemia; and B12 deficiency on his problem list.    ALLERGIES:  is allergic to bee venom; adhesive; ciprofloxacin; codeine; iodine; latex; povidone-iodine; and simvastatin.  MEDICATIONS: has a current medication list which includes the following prescription(s): acetaminophen, amlodipine, atorvastatin, cyanocobalamin, diazepam, diphenhydramine, escitalopram, insulin glargine, lidocaine-prilocaine, lisinopril, lorazepam, metformin, metoclopramide, pantoprazole, prochlorperazine, tramadol, and mupirocin ointment, and the following Facility-Administered Medications: heparin lock flush and sodium chloride.  SURGICAL HISTORY:  Past Surgical History  Procedure Laterality Date  . Tonsillectomy    . Back surgery    . Vasectomy    . Knee surgery Right     total knee  . Shoulder surgery    . Prostatectomy    . Colonoscopy with esophagogastroduodenoscopy (egd) N/A 08/27/2013    Procedure: COLONOSCOPY WITH ESOPHAGOGASTRODUODENOSCOPY (EGD);  Surgeon: Rogene Houston, MD;  [facility];  Service: Endoscopy;  Laterality: N/A;  730  . Balloon dilation N/A 08/27/2013    Procedure: BALLOON DILATION;  Surgeon: Rogene Houston, MD;  [facility];  Service: Endoscopy;  Laterality: N/A;  Venia Minks dilation N/A 08/27/2013    Procedure: Venia Minks DILATION;  Surgeon: Rogene Houston, MD;  [facility];  Service: Endoscopy;  Laterality: N/A;  . Savory dilation N/A 08/27/2013    Procedure: SAVORY DILATION;  Surgeon: Rogene Houston, MD;  [facility];   Service: Endoscopy;  Laterality: N/A;  . Portacath placement Right 2014  . Colonoscopy N/A 12/17/2013    Procedure: COLONOSCOPY;  Surgeon: Rogene Houston, MD;  [facility];  Service: Endoscopy;  Laterality: N/A;  940  . Colon surgery    . Knee arthroscopy Left   . Removal of port Right   . Colonoscopy N/A 02/28/2014    Procedure: COLONOSCOPY;  Surgeon: Rogene Houston, MD;  [facility];  Service: Endoscopy;  Laterality: N/A;  730    FAMILY HISTORY: family history includes Cancer in his brother and brother; Colon cancer in his brother; Diabetes in his father.  SOCIAL HISTORY:  reports that he has never smoked. He has never used smokeless tobacco. He reports that he does not drink alcohol or use illicit drugs.  REVIEW OF SYSTEMS:  Other than that discussed above is noncontributory.  PHYSICAL EXAMINATION: ECOG PERFORMANCE STATUS: 1 - Symptomatic but completely ambulatory  Blood pressure 122/60, pulse 77, temperature 97.8 F (36.6 C), temperature source Oral, resp. rate 18, weight 221 lb 8 oz (100.472 kg).  GENERAL:alert, no distress and comfortable SKIN: skin color, texture, turgor are normal, no rashes or significant lesions EYES: PERLA; Conjunctiva are pink and non-injected, sclera clear SINUSES: No redness or tenderness over maxillary or ethmoid sinuses OROPHARYNX:no exudate, no erythema on lips, buccal mucosa, or tongue. NECK: supple, thyroid normal size, non-tender, without nodularity. No masses CHEST: Normal AP diameter with light port in place in the right anterior chest. No gynecomastia. LYMPH:  no palpable lymphadenopathy in the cervical, axillary or inguinal LUNGS: clear to auscultation and percussion with normal breathing effort HEART: regular rate & rhythm and no murmurs. ABDOMEN:abdomen soft, non-tender and normal bowel sounds MUSCULOSKELETAL:no cyanosis of digits and no clubbing. Range of motion normal.  NEURO: alert & oriented x 3 with fluent  speech, no focal motor/sensory deficits   LABORATORY DATA: Infusion on 05/10/2014  Component Date Value Ref Range Status  . WBC 05/10/2014 2.6* 4.0 - 10.5 K/uL Final  . RBC 05/10/2014 3.63* 4.22 - 5.81 MIL/uL Final  . Hemoglobin 05/10/2014 11.8* 13.0 - 17.0 g/dL Final  . HCT 05/10/2014 33.9* 39.0 - 52.0 % Final  . MCV 05/10/2014 93.4  78.0 - 100.0 fL Final  . MCH 05/10/2014 32.5  26.0 - 34.0 pg Final  . MCHC 05/10/2014 34.8  30.0 - 36.0 g/dL Final  . RDW 05/10/2014 14.7  11.5 - 15.5 % Final  . Platelets 05/10/2014 90* 150 - 400 K/uL Final   Comment: SPECIMEN CHECKED FOR CLOTS                          CONSISTENT WITH PREVIOUS RESULT  . Neutrophils Relative % 05/10/2014 69  43 - 77 % Final  . Neutro Abs 05/10/2014 1.8  1.7 - 7.7 K/uL Final  . Lymphocytes Relative 05/10/2014 21  12 - 46 % Final  . Lymphs Abs 05/10/2014 0.5* 0.7 - 4.0 K/uL Final  . Monocytes Relative 05/10/2014 8  3 - 12 % Final  . Monocytes Absolute 05/10/2014 0.2  0.1 - 1.0 K/uL Final  . Eosinophils Relative 05/10/2014 2  0 - 5 % Final  . Eosinophils Absolute 05/10/2014 0.1  0.0 - 0.7 K/uL Final  . Basophils Relative 05/10/2014 0  0 - 1 % Final  . Basophils Absolute 05/10/2014 0.0  0.0 - 0.1 K/uL Final  .  Retic Ct Pct 05/10/2014 1.4  0.4 - 3.1 % Final  . RBC. 05/10/2014 3.63* 4.22 - 5.81 MIL/uL Final  . Retic Count, Manual 05/10/2014 50.8  19.0 - 186.0 K/uL Final  . Sodium 05/10/2014 144  137 - 147 mEq/L Final  . Potassium 05/10/2014 4.1  3.7 - 5.3 mEq/L Final  . Chloride 05/10/2014 106  96 - 112 mEq/L Final  . CO2 05/10/2014 26  19 - 32 mEq/L Final  . Glucose, Bld 05/10/2014 145* 70 - 99 mg/dL Final  . BUN 05/10/2014 27* 6 - 23 mg/dL Final  . Creatinine, Ser 05/10/2014 1.32  0.50 - 1.35 mg/dL Final  . Calcium 05/10/2014 8.9  8.4 - 10.5 mg/dL Final  . Total Protein 05/10/2014 6.5  6.0 - 8.3 g/dL Final  . Albumin 05/10/2014 3.9  3.5 - 5.2 g/dL Final  . AST 05/10/2014 17  0 - 37 U/L Final  . ALT 05/10/2014 16  0  - 53 U/L Final  . Alkaline Phosphatase 05/10/2014 44  39 - 117 U/L Final  . Total Bilirubin 05/10/2014 0.7  0.3 - 1.2 mg/dL Final  . GFR calc non Af Amer 05/10/2014 51* >90 mL/min Final  . GFR calc Af Amer 05/10/2014 59* >90 mL/min Final   Comment: (NOTE)                          The eGFR has been calculated using the CKD EPI equation.                          This calculation has not been validated in all clinical situations.                          eGFR's persistently <90 mL/min signify possible Chronic Kidney                          Disease.  . Anion gap 05/10/2014 12  5 - 15 Final  . LDH 05/10/2014 164  94 - 250 U/L Final    PATHOLOGY: No new pathology.  Urinalysis    Component Value Date/Time   COLORURINE YELLOW 09/19/2007 1940   APPEARANCEUR CLEAR 09/19/2007 1940   LABSPEC 1.020 09/19/2007 1940   PHURINE 5.5 09/19/2007 1940   GLUCOSEU >1000* 09/19/2007 1940   HGBUR TRACE* 09/19/2007 1940   BILIRUBINUR NEGATIVE 09/19/2007 1940   KETONESUR NEGATIVE 09/19/2007 1940   PROTEINUR NEGATIVE 09/19/2007 1940   UROBILINOGEN 0.2 09/19/2007 1940   NITRITE NEGATIVE 09/19/2007 1940   LEUKOCYTESUR NEGATIVE 09/19/2007 1940    RADIOGRAPHIC STUDIES: No results found.  ASSESSMENT:  1.Stage I-E. mantle cell lymphoma involving the colon, normal CT scans initially, normal colonoscopy performed on 02/28/2014 with original diagnosis made by colonoscopy only, status post 6 cycles of bendamustine/Rituxan, for cycle 1 of Rituxan maintenance today. 2. Ulcerative colitis, improved.  3. Depression and anxiety disorder, much improved., Still with fatigue.  4. Past history of prostate cancer, stable. 5. Tremors associated with Compazine.  6. Pancytopenia secondary to previous chemotherapy    PLAN:  1. Rituxan 500 mg per meter squared IV today 2. Increase Lexapro to 20 mg at bedtime. 3. Call if develops fever or GI discomfort. 4. Followup in 3 months with CBC, chem profile, beta 2  microglobulin, LDH.   All questions were answered. The patient knows to call the clinic with any problems,  questions or concerns. We can certainly see the patient much sooner if necessary.   I spent 25 minutes counseling the patient face to face. The total time spent in the appointment was 30 minutes.    Doroteo Bradford, MD 05/10/2014 9:43 AM  DISCLAIMER:  This note was dictated with voice recognition software.  Similar sounding words can inadvertently be transcribed inaccurately and may not be corrected upon review.

## 2014-05-10 NOTE — Patient Instructions (Signed)
[facility] Discharge Instructions  RECOMMENDATIONS MADE BY THE CONSULTANT AND ANY TEST RESULTS WILL BE SENT TO YOUR REFERRING PHYSICIAN.  EXAM FINDINGS BY THE PHYSICIAN TODAY AND SIGNS OR SYMPTOMS TO REPORT TO CLINIC OR PRIMARY PHYSICIAN: Exam and findings as discussed by Dr. Barnet Glasgow.  Will continue with current regimen. Report unexplained weight loss, fevers, night sweats or other concerns.  MEDICATIONS PRESCRIBED:  Lexapro 20 mg daily as needed   INSTRUCTIONS/FOLLOW-UP: Monthly for B12 injections Labs, chemo, B12 injection and office visit in 3 months.  Thank you for choosing [facility] to provide your oncology and hematology care.  To afford each patient quality time with our providers, please arrive at least 15 minutes before your scheduled appointment time.  With your help, our goal is to use those 15 minutes to complete the necessary work-up to ensure our physicians have the information they need to help with your evaluation and healthcare recommendations.    Effective January 1st, 2014, we ask that you re-schedule your appointment with our physicians should you arrive 10 or more minutes late for your appointment.  We strive to give you quality time with our providers, and arriving late affects you and other patients whose appointments are after yours.    Again, thank you for choosing [facility].  Our hope is that these requests will decrease the amount of time that you wait before being seen by our physicians.       _____________________________________________________________  Should you have questions after your visit to [facility], please contact our office at [phone] between the hours of 8:30 a.m. and 4:30 p.m.  Voicemails left after 4:30 p.m. will not be returned until the following business day.  For prescription refill requests, have your pharmacy contact our office with your prescription refill request.     _______________________________________________________________  We hope that we have given you very good care.  You may receive a patient satisfaction survey in the mail, please complete it and return it as soon as possible.  We value your feedback!  _______________________________________________________________  Have you asked about our STAR program?  STAR stands for Survivorship Training and Rehabilitation, and this is a nationally recognized cancer care program that focuses on survivorship and rehabilitation.  Cancer and cancer treatments may cause problems, such as, pain, making you feel tired and keeping you from doing the things that you need or want to do. Cancer rehabilitation can help. Our goal is to reduce these troubling effects and help you have the best quality of life possible.  You may receive a survey from a nurse that asks questions about your current state of health.  Based on the survey results, all eligible patients will be referred to the [facility] program for an evaluation so we can better serve you!  A frequently asked questions sheet is available upon request.

## 2014-05-10 NOTE — Progress Notes (Signed)
Patient reports he took tylenol at home this morning prior to appointment. [name] presents today for injection per MD orders. B12 1000 mcg administered IM in right Upper Arm. Administration without incident. Patient tolerated well.

## 2014-05-11 ENCOUNTER — Telehealth (HOSPITAL_COMMUNITY): Payer: Self-pay | Admitting: Hematology and Oncology

## 2014-05-11 ENCOUNTER — Other Ambulatory Visit (HOSPITAL_COMMUNITY): Payer: Self-pay | Admitting: Hematology and Oncology

## 2014-05-11 DIAGNOSIS — C8310 Mantle cell lymphoma, unspecified site: Secondary | ICD-10-CM

## 2014-05-11 MED ORDER — ESCITALOPRAM OXALATE 20 MG PO TABS: ORAL_TABLET | ORAL | Status: DC

## 2014-05-12 LAB — BETA 2 MICROGLOBULIN, SERUM: Beta-2 Microglobulin: 4.61 mg/L — ABNORMAL HIGH (ref <=2.51)

## 2014-05-16 DIAGNOSIS — E291 Testicular hypofunction: Secondary | ICD-10-CM | POA: Diagnosis not present

## 2014-05-16 DIAGNOSIS — E119 Type 2 diabetes mellitus without complications: Secondary | ICD-10-CM | POA: Diagnosis not present

## 2014-05-16 DIAGNOSIS — I10 Essential (primary) hypertension: Secondary | ICD-10-CM | POA: Diagnosis not present

## 2014-05-16 DIAGNOSIS — I2510 Atherosclerotic heart disease of native coronary artery without angina pectoris: Secondary | ICD-10-CM | POA: Diagnosis not present

## 2014-05-20 ENCOUNTER — Other Ambulatory Visit (HOSPITAL_COMMUNITY): Payer: Self-pay

## 2014-05-20 ENCOUNTER — Other Ambulatory Visit (HOSPITAL_COMMUNITY): Payer: Self-pay | Admitting: *Deleted

## 2014-05-20 ENCOUNTER — Telehealth (HOSPITAL_COMMUNITY): Payer: Self-pay | Admitting: *Deleted

## 2014-05-20 DIAGNOSIS — C8310 Mantle cell lymphoma, unspecified site: Secondary | ICD-10-CM

## 2014-05-20 DIAGNOSIS — L821 Other seborrheic keratosis: Secondary | ICD-10-CM | POA: Diagnosis not present

## 2014-05-20 DIAGNOSIS — L719 Rosacea, unspecified: Secondary | ICD-10-CM | POA: Diagnosis not present

## 2014-05-20 DIAGNOSIS — L819 Disorder of pigmentation, unspecified: Secondary | ICD-10-CM | POA: Diagnosis not present

## 2014-05-20 DIAGNOSIS — Z85828 Personal history of other malignant neoplasm of skin: Secondary | ICD-10-CM | POA: Diagnosis not present

## 2014-05-20 DIAGNOSIS — L570 Actinic keratosis: Secondary | ICD-10-CM | POA: Diagnosis not present

## 2014-05-20 NOTE — Telephone Encounter (Signed)
Dr. Barnet Glasgow ordered a Iron, TIBC, Ferritin, & TSH on patient. Patient just had a TSH level drawn at Dr. Juel Burrow office and resulted @ .948. TSH level of .948. We will not draw a TSH since this was just done a week ago. Labs were placed in scanner box on 05/19/14.

## 2014-05-24 ENCOUNTER — Encounter (HOSPITAL_COMMUNITY): Payer: Medicare Other | Attending: Hematology and Oncology

## 2014-05-24 DIAGNOSIS — C8319 Mantle cell lymphoma, extranodal and solid organ sites: Secondary | ICD-10-CM | POA: Diagnosis not present

## 2014-05-24 DIAGNOSIS — C8310 Mantle cell lymphoma, unspecified site: Secondary | ICD-10-CM

## 2014-05-24 LAB — IRON AND TIBC
IRON: 83 ug/dL (ref 42–135)
SATURATION RATIOS: 30 % (ref 20–55)
TIBC: 278 ug/dL (ref 215–435)
UIBC: 195 ug/dL (ref 125–400)

## 2014-05-24 LAB — FERRITIN: Ferritin: 163 ng/mL (ref 22–322)

## 2014-05-24 NOTE — Progress Notes (Signed)
Labs for iron/tibc

## 2014-06-06 ENCOUNTER — Encounter (HOSPITAL_BASED_OUTPATIENT_CLINIC_OR_DEPARTMENT_OTHER): Payer: Medicare Other

## 2014-06-06 VITALS — BP 130/71 | HR 65 | Temp 97.7°F | Resp 18

## 2014-06-06 DIAGNOSIS — E538 Deficiency of other specified B group vitamins: Secondary | ICD-10-CM

## 2014-06-06 DIAGNOSIS — C8310 Mantle cell lymphoma, unspecified site: Secondary | ICD-10-CM

## 2014-06-06 MED ORDER — CYANOCOBALAMIN 1000 MCG/ML IJ SOLN
INTRAMUSCULAR | Status: AC
  Filled 2014-06-06: qty 1

## 2014-06-06 MED ORDER — CYANOCOBALAMIN 1000 MCG/ML IJ SOLN
1000.0000 ug | Freq: Once | INTRAMUSCULAR | Status: AC
  Administered 2014-06-06: 1000 ug via INTRAMUSCULAR

## 2014-06-06 NOTE — Patient Instructions (Signed)
[facility] Discharge Instructions  RECOMMENDATIONS MADE BY THE CONSULTANT AND ANY TEST RESULTS WILL BE SENT TO YOUR REFERRING PHYSICIAN.  You received vitamin b12 injection today Notify clinic of any concerns or needs  Thank you for choosing [facility] to provide your oncology and hematology care.  To afford each patient quality time with our providers, please arrive at least 15 minutes before your scheduled appointment time.  With your help, our goal is to use those 15 minutes to complete the necessary work-up to ensure our physicians have the information they need to help with your evaluation and healthcare recommendations.    Effective January 1st, 2014, we ask that you re-schedule your appointment with our physicians should you arrive 10 or more minutes late for your appointment.  We strive to give you quality time with our providers, and arriving late affects you and other patients whose appointments are after yours.    Again, thank you for choosing [facility].  Our hope is that these requests will decrease the amount of time that you wait before being seen by our physicians.       _____________________________________________________________  Should you have questions after your visit to [facility], please contact our office at [phone] between the hours of 8:30 a.m. and 5:00 p.m.  Voicemails left after 4:30 p.m. will not be returned until the following business day.  For prescription refill requests, have your pharmacy contact our office with your prescription refill request.

## 2014-06-06 NOTE — Progress Notes (Signed)
[name]'s reason for visit today is for an injection       [name] also received vitamin b12 injection per MD orders; see [facility] for administration details.  [name] tolerated all procedures well and without incident; questions were answered and patient was discharged.

## 2014-06-07 ENCOUNTER — Ambulatory Visit (HOSPITAL_COMMUNITY): Payer: Medicare Other

## 2014-06-07 ENCOUNTER — Encounter (HOSPITAL_COMMUNITY): Payer: Medicare Other

## 2014-06-21 ENCOUNTER — Encounter (HOSPITAL_BASED_OUTPATIENT_CLINIC_OR_DEPARTMENT_OTHER): Payer: Medicare Other

## 2014-06-21 DIAGNOSIS — Z452 Encounter for adjustment and management of vascular access device: Secondary | ICD-10-CM | POA: Diagnosis not present

## 2014-06-21 DIAGNOSIS — C8319 Mantle cell lymphoma, extranodal and solid organ sites: Secondary | ICD-10-CM | POA: Diagnosis not present

## 2014-06-21 DIAGNOSIS — Z95828 Presence of other vascular implants and grafts: Secondary | ICD-10-CM

## 2014-06-21 MED ORDER — SODIUM CHLORIDE 0.9 % IJ SOLN
10.0000 mL | Freq: Once | INTRAMUSCULAR | Status: AC
  Administered 2014-06-21: 10 mL via INTRAVENOUS

## 2014-06-21 MED ORDER — HEPARIN SOD (PORK) LOCK FLUSH 100 UNIT/ML IV SOLN
INTRAVENOUS | Status: AC
  Filled 2014-06-21: qty 5

## 2014-06-21 MED ORDER — HEPARIN SOD (PORK) LOCK FLUSH 100 UNIT/ML IV SOLN
500.0000 [IU] | Freq: Once | INTRAVENOUS | Status: AC
  Administered 2014-06-21: 500 [IU] via INTRAVENOUS

## 2014-06-21 NOTE — Progress Notes (Signed)
[name] presented for Portacath access and flush. Proper placement of portacath confirmed by CXR. Portacath located right chest wall accessed with  H 20 needle. Good blood return present. Portacath flushed with 7ml NS and 500U/35ml Heparin and needle removed intact. Procedure without incident. Patient tolerated procedure well.

## 2014-06-21 NOTE — Patient Instructions (Signed)
[facility] Discharge Instructions  RECOMMENDATIONS MADE BY THE CONSULTANT AND ANY TEST RESULTS WILL BE SENT TO YOUR REFERRING PHYSICIAN.  INSTRUCTIONS/FOLLOW-UP: Port flush today. Return as scheduled for B12 injection and chemo.  Thank you for choosing [facility] to provide your oncology and hematology care.  To afford each patient quality time with our providers, please arrive at least 15 minutes before your scheduled appointment time.  With your help, our goal is to use those 15 minutes to complete the necessary work-up to ensure our physicians have the information they need to help with your evaluation and healthcare recommendations.    Effective January 1st, 2014, we ask that you re-schedule your appointment with our physicians should you arrive 10 or more minutes late for your appointment.  We strive to give you quality time with our providers, and arriving late affects you and other patients whose appointments are after yours.    Again, thank you for choosing [facility].  Our hope is that these requests will decrease the amount of time that you wait before being seen by our physicians.       _____________________________________________________________  Should you have questions after your visit to [facility], please contact our office at [phone] between the hours of 8:30 a.m. and 4:30 p.m.  Voicemails left after 4:30 p.m. will not be returned until the following business day.  For prescription refill requests, have your pharmacy contact our office with your prescription refill request.    _______________________________________________________________  We hope that we have given you very good care.  You may receive a patient satisfaction survey in the mail, please complete it and return it as soon as possible.  We value your feedback!  _______________________________________________________________  Have you asked  about our STAR program?  STAR stands for Survivorship Training and Rehabilitation, and this is a nationally recognized cancer care program that focuses on survivorship and rehabilitation.  Cancer and cancer treatments may cause problems, such as, pain, making you feel tired and keeping you from doing the things that you need or want to do. Cancer rehabilitation can help. Our goal is to reduce these troubling effects and help you have the best quality of life possible.  You may receive a survey from a nurse that asks questions about your current state of health.  Based on the survey results, all eligible patients will be referred to the [facility] program for an evaluation so we can better serve you!  A frequently asked questions sheet is available upon request.

## 2014-06-22 DIAGNOSIS — R059 Cough, unspecified: Secondary | ICD-10-CM | POA: Diagnosis not present

## 2014-06-22 DIAGNOSIS — R05 Cough: Secondary | ICD-10-CM | POA: Diagnosis not present

## 2014-06-22 DIAGNOSIS — J029 Acute pharyngitis, unspecified: Secondary | ICD-10-CM | POA: Diagnosis not present

## 2014-06-22 DIAGNOSIS — J069 Acute upper respiratory infection, unspecified: Secondary | ICD-10-CM | POA: Diagnosis not present

## 2014-06-30 DIAGNOSIS — E119 Type 2 diabetes mellitus without complications: Secondary | ICD-10-CM | POA: Diagnosis not present

## 2014-07-05 ENCOUNTER — Encounter (HOSPITAL_COMMUNITY): Payer: Medicare Other | Attending: Hematology and Oncology

## 2014-07-05 VITALS — BP 93/59 | HR 91 | Temp 98.3°F | Resp 18

## 2014-07-05 DIAGNOSIS — C8310 Mantle cell lymphoma, unspecified site: Secondary | ICD-10-CM | POA: Diagnosis not present

## 2014-07-05 MED ORDER — CYANOCOBALAMIN 1000 MCG/ML IJ SOLN
1000.0000 ug | Freq: Once | INTRAMUSCULAR | Status: AC
  Administered 2014-07-05: 1000 ug via INTRAMUSCULAR

## 2014-07-05 MED ORDER — CYANOCOBALAMIN 1000 MCG/ML IJ SOLN
INTRAMUSCULAR | Status: AC
  Filled 2014-07-05: qty 1

## 2014-07-05 NOTE — Progress Notes (Signed)
Patient tolerated b-12 injection well to left deltoid.

## 2014-07-07 ENCOUNTER — Other Ambulatory Visit (HOSPITAL_COMMUNITY): Payer: Self-pay | Admitting: Oncology

## 2014-07-07 DIAGNOSIS — C8310 Mantle cell lymphoma, unspecified site: Secondary | ICD-10-CM

## 2014-07-07 MED ORDER — LORAZEPAM 0.5 MG PO TABS: ORAL_TABLET | ORAL | Status: DC

## 2014-07-12 DIAGNOSIS — M7752 Other enthesopathy of left foot: Secondary | ICD-10-CM | POA: Diagnosis not present

## 2014-07-12 DIAGNOSIS — M84375A Stress fracture, left foot, initial encounter for fracture: Secondary | ICD-10-CM | POA: Diagnosis not present

## 2014-07-12 DIAGNOSIS — M79672 Pain in left foot: Secondary | ICD-10-CM | POA: Diagnosis not present

## 2014-07-12 DIAGNOSIS — E1142 Type 2 diabetes mellitus with diabetic polyneuropathy: Secondary | ICD-10-CM | POA: Diagnosis not present

## 2014-07-12 DIAGNOSIS — L851 Acquired keratosis [keratoderma] palmaris et plantaris: Secondary | ICD-10-CM | POA: Diagnosis not present

## 2014-07-12 DIAGNOSIS — B351 Tinea unguium: Secondary | ICD-10-CM | POA: Diagnosis not present

## 2014-07-14 DIAGNOSIS — H01009 Unspecified blepharitis unspecified eye, unspecified eyelid: Secondary | ICD-10-CM | POA: Diagnosis not present

## 2014-08-02 ENCOUNTER — Encounter (HOSPITAL_BASED_OUTPATIENT_CLINIC_OR_DEPARTMENT_OTHER): Payer: Medicare Other

## 2014-08-02 ENCOUNTER — Encounter (HOSPITAL_COMMUNITY): Payer: Medicare Other

## 2014-08-02 ENCOUNTER — Encounter (HOSPITAL_COMMUNITY): Payer: Medicare Other | Attending: Hematology and Oncology

## 2014-08-02 ENCOUNTER — Encounter (HOSPITAL_COMMUNITY): Payer: Self-pay

## 2014-08-02 ENCOUNTER — Inpatient Hospital Stay (HOSPITAL_COMMUNITY): Payer: Medicare Other

## 2014-08-02 ENCOUNTER — Other Ambulatory Visit (HOSPITAL_COMMUNITY): Payer: Self-pay | Admitting: Hematology and Oncology

## 2014-08-02 VITALS — BP 141/72 | HR 68 | Temp 97.6°F | Resp 18 | Wt 224.2 lb

## 2014-08-02 DIAGNOSIS — F329 Major depressive disorder, single episode, unspecified: Secondary | ICD-10-CM

## 2014-08-02 DIAGNOSIS — C8310 Mantle cell lymphoma, unspecified site: Secondary | ICD-10-CM

## 2014-08-02 DIAGNOSIS — Z8719 Personal history of other diseases of the digestive system: Secondary | ICD-10-CM

## 2014-08-02 DIAGNOSIS — C61 Malignant neoplasm of prostate: Secondary | ICD-10-CM

## 2014-08-02 DIAGNOSIS — Z8546 Personal history of malignant neoplasm of prostate: Secondary | ICD-10-CM | POA: Diagnosis not present

## 2014-08-02 DIAGNOSIS — Z5112 Encounter for antineoplastic immunotherapy: Secondary | ICD-10-CM

## 2014-08-02 DIAGNOSIS — D61810 Antineoplastic chemotherapy induced pancytopenia: Secondary | ICD-10-CM | POA: Diagnosis not present

## 2014-08-02 DIAGNOSIS — K5190 Ulcerative colitis, unspecified, without complications: Secondary | ICD-10-CM | POA: Diagnosis not present

## 2014-08-02 DIAGNOSIS — G4733 Obstructive sleep apnea (adult) (pediatric): Secondary | ICD-10-CM

## 2014-08-02 LAB — COMPREHENSIVE METABOLIC PANEL
ALT: 12 U/L (ref 0–53)
ANION GAP: 13 (ref 5–15)
AST: 14 U/L (ref 0–37)
Albumin: 3.8 g/dL (ref 3.5–5.2)
Alkaline Phosphatase: 56 U/L (ref 39–117)
BILIRUBIN TOTAL: 0.5 mg/dL (ref 0.3–1.2)
BUN: 29 mg/dL — AB (ref 6–23)
CALCIUM: 9.2 mg/dL (ref 8.4–10.5)
CO2: 26 mEq/L (ref 19–32)
CREATININE: 1.35 mg/dL (ref 0.50–1.35)
Chloride: 104 mEq/L (ref 96–112)
GFR calc Af Amer: 57 mL/min — ABNORMAL LOW (ref >90)
GFR calc non Af Amer: 49 mL/min — ABNORMAL LOW (ref >90)
Glucose, Bld: 143 mg/dL — ABNORMAL HIGH (ref 70–99)
Potassium: 4.1 mEq/L (ref 3.7–5.3)
Sodium: 143 mEq/L (ref 137–147)
TOTAL PROTEIN: 6.6 g/dL (ref 6.0–8.3)

## 2014-08-02 LAB — CBC WITH DIFFERENTIAL/PLATELET
BASOS ABS: 0 10*3/uL (ref 0.0–0.1)
BASOS PCT: 1 % (ref 0–1)
Eosinophils Absolute: 0.1 10*3/uL (ref 0.0–0.7)
Eosinophils Relative: 4 % (ref 0–5)
HCT: 33.1 % — ABNORMAL LOW (ref 39.0–52.0)
Hemoglobin: 11.4 g/dL — ABNORMAL LOW (ref 13.0–17.0)
LYMPHS PCT: 18 % (ref 12–46)
Lymphs Abs: 0.5 10*3/uL — ABNORMAL LOW (ref 0.7–4.0)
MCH: 32.1 pg (ref 26.0–34.0)
MCHC: 34.4 g/dL (ref 30.0–36.0)
MCV: 93.2 fL (ref 78.0–100.0)
Monocytes Absolute: 0.2 10*3/uL (ref 0.1–1.0)
Monocytes Relative: 9 % (ref 3–12)
NEUTROS PCT: 68 % (ref 43–77)
Neutro Abs: 1.7 10*3/uL (ref 1.7–7.7)
Platelets: 100 10*3/uL — ABNORMAL LOW (ref 150–400)
RBC: 3.55 MIL/uL — ABNORMAL LOW (ref 4.22–5.81)
RDW: 14.6 % (ref 11.5–15.5)
WBC: 2.5 10*3/uL — ABNORMAL LOW (ref 4.0–10.5)

## 2014-08-02 LAB — LACTATE DEHYDROGENASE: LDH: 145 U/L (ref 94–250)

## 2014-08-02 LAB — RETICULOCYTES
RBC.: 3.55 MIL/uL — AB (ref 4.22–5.81)
RETIC COUNT ABSOLUTE: 60.4 10*3/uL (ref 19.0–186.0)
Retic Ct Pct: 1.7 % (ref 0.4–3.1)

## 2014-08-02 MED ORDER — HEPARIN SOD (PORK) LOCK FLUSH 100 UNIT/ML IV SOLN
INTRAVENOUS | Status: AC
  Filled 2014-08-02: qty 5

## 2014-08-02 MED ORDER — ACETAMINOPHEN 325 MG PO TABS: 650.0000 mg | ORAL_TABLET | Freq: Once | ORAL | Status: DC

## 2014-08-02 MED ORDER — HEPARIN SOD (PORK) LOCK FLUSH 100 UNIT/ML IV SOLN
500.0000 [IU] | Freq: Once | INTRAVENOUS | Status: AC | PRN
  Administered 2014-08-02: 500 [IU]

## 2014-08-02 MED ORDER — DIPHENHYDRAMINE HCL 25 MG PO CAPS: 50.0000 mg | ORAL_CAPSULE | Freq: Once | ORAL | Status: DC

## 2014-08-02 MED ORDER — METOCLOPRAMIDE HCL 10 MG PO TABS: ORAL_TABLET | ORAL | Status: DC

## 2014-08-02 MED ORDER — CYANOCOBALAMIN 1000 MCG/ML IJ SOLN
INTRAMUSCULAR | Status: AC
Start: 2014-08-02 — End: 2014-08-02
  Filled 2014-08-02: qty 1

## 2014-08-02 MED ORDER — DIAZEPAM 5 MG PO TABS: ORAL_TABLET | ORAL | Status: DC

## 2014-08-02 MED ORDER — SODIUM CHLORIDE 0.9 % IJ SOLN
10.0000 mL | INTRAMUSCULAR | Status: DC | PRN
Start: 2014-08-02 — End: 2014-08-02

## 2014-08-02 MED ORDER — CYANOCOBALAMIN 1000 MCG/ML IJ SOLN
1000.0000 ug | Freq: Once | INTRAMUSCULAR | Status: AC
  Administered 2014-08-02: 1000 ug via INTRAMUSCULAR

## 2014-08-02 MED ORDER — SODIUM CHLORIDE 0.9 % IV SOLN
Freq: Once | INTRAVENOUS | Status: AC
  Administered 2014-08-02: 09:00:00 via INTRAVENOUS

## 2014-08-02 MED ORDER — SODIUM CHLORIDE 0.9 % IV SOLN
500.0000 mg/m2 | Freq: Once | INTRAVENOUS | Status: AC
  Administered 2014-08-02: 1100 mg via INTRAVENOUS
  Filled 2014-08-02: qty 110

## 2014-08-02 NOTE — Progress Notes (Signed)
[facility]  OFFICE PROGRESS NOTE  [place], [facility], MD  [place] [address]  DIAGNOSIS: Mantle cell lymphoma - Plan: Reticulocytes, Comprehensive metabolic panel, Lactate dehydrogenase, CBC with Differential, Beta 2 microglobuline, serum  Prostate cancer - Plan: PSA  Chief Complaint  Patient presents with  . mantle cell lymphoma    maintenance Rituxan No. 2 today  . Prostate Cancer    CURRENT THERAPY: maintenance Rituxan for cycle #2 today  INTERVAL HISTORY: [name] 79 y.o. male returns for follow-up while receiving maintenance Rituxan therapy after treatment with 6 cycles of bendamustine/Rituxan begun on 09/14/2013 and ended on 02/07/2014 for mantle cell lymphoma involving the large intestine. Imaging studies were all negative and repeat colonoscopy documented no evidence of inflammation or lymphoma on 02/28/2014. He continues to do well with intermittent episodes of brief nausea without significant diarrhea, hematochezia, melena, hematuria, or urinary hesitancy. Appetite is good with no vomiting. He denies any fever, night sweats, easy satiety, cough, wheezing, sore throat, earache, skin rash, joint pain, headache, or seizures.  MEDICAL HISTORY: Past Medical History  Diagnosis Date  . Type 2 diabetes mellitus   . 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 Mixed hyperlipidemia   . PVD (peripheral vascular disease)   . Essential hypertension, benign   . Prostate cancer   . Colon cancer   . B12 deficiency 02/07/2014  . Obstructive sleep apnea     does not use  . GERD (gastroesophageal reflux disease)     INTERIM HISTORY: has Mixed hyperlipidemia; HYPERTENSION, BENIGN; Coronary atherosclerosis of native coronary artery; VASOMOTOR RHINITIS; GERD; OSA (obstructive sleep apnea); H/O ulcerative colitis; Mantle cell lymphoma;  Prostate cancer; Proctitis, radiation; Abdominal pain, left lower quadrant; Abdominal pain, unspecified site; Lower GI bleed; Diverticulitis; Leukopenia; Diverticulosis of colon with hemorrhage; Thrombocytopenia, unspecified; Acute blood loss anemia; and B12 deficiency on his problem list.     Mantle cell lymphoma   08/27/2013 Initial Diagnosis Colon, biopsy, random - MANTLE CELL LYMPHOMA   09/06/2013 Imaging CT CAP- No significant lymphadenopathy identified within the chest, abdomen or pelvis.   09/14/2013 - 02/07/2014 Chemotherapy Bendamustine/Rituxan x 6 cycles with Neulasta support   05/10/2014 -  Chemotherapy maintenance Rituxan 500 mg/m cycle 1     ALLERGIES:  is allergic to bee venom; adhesive; ciprofloxacin; codeine; iodine; latex; povidone-iodine; and simvastatin.  MEDICATIONS: has a current medication list which includes the following prescription(s): acetaminophen, amlodipine, atorvastatin, cyanocobalamin, diazepam, diphenhydramine, escitalopram, insulin glargine, lidocaine-prilocaine, lisinopril, lorazepam, metformin, mupirocin ointment, pantoprazole, tramadol, metoclopramide, and prochlorperazine, and the following Facility-Administered Medications: acetaminophen, diphenhydramine, heparin lock flush, and sodium chloride.  SURGICAL HISTORY:  Past Surgical History  Procedure Laterality Date  . Tonsillectomy    . Back surgery    . Vasectomy    . Knee surgery Right     total knee  . Shoulder surgery    . Prostatectomy    . Colonoscopy with esophagogastroduodenoscopy (egd) N/A 08/27/2013    Procedure: COLONOSCOPY WITH ESOPHAGOGASTRODUODENOSCOPY (EGD);  Surgeon: Rogene Houston, MD;  [facility];  Service: Endoscopy;  Laterality: N/A;  730  . Balloon dilation N/A 08/27/2013    Procedure: BALLOON DILATION;  Surgeon: Rogene Houston, MD;  Location: [facility];  Service: Endoscopy;  Laterality: N/A;  Venia Minks dilation N/A 08/27/2013    Procedure: MALONEY DILATION;  Surgeon: Rogene Houston, MD;  Location: [facility];  Service: Endoscopy;  Laterality: N/A;  . Savory dilation N/A 08/27/2013    Procedure: SAVORY DILATION;  Surgeon: Rogene Houston, MD;  Location: [facility];  Service: Endoscopy;  Laterality: N/A;  . Portacath placement Right 2014  . Colonoscopy N/A 12/17/2013    Procedure: COLONOSCOPY;  Surgeon: Rogene Houston, MD;  Location: [facility];  Service: Endoscopy;  Laterality: N/A;  940  . Colon surgery    . Knee arthroscopy Left   . Removal of port Right   . Colonoscopy N/A 02/28/2014    Procedure: COLONOSCOPY;  Surgeon: Rogene Houston, MD;  [facility]: [facility];  Service: Endoscopy;  Laterality: N/A;  730    FAMILY HISTORY: family history includes Cancer in his brother and brother; Colon cancer in his brother; Diabetes in his father.  SOCIAL HISTORY:  reports that he has never smoked. He has never used smokeless tobacco. He reports that he does not drink alcohol or use illicit drugs.  REVIEW OF SYSTEMS:  Other than that discussed above is noncontributory.  PHYSICAL EXAMINATION: ECOG PERFORMANCE STATUS: 1 - Symptomatic but completely ambulatory  Blood pressure 141/72, pulse 68, temperature 97.6 F (36.4 C), temperature source Oral, resp. rate 18, weight 224 lb 3.2 oz (101.696 kg), SpO2 97 %.  GENERAL:alert, no distress and comfortable SKIN: skin color, texture, turgor are normal, no rashes or significant lesions EYES: PERLA; Conjunctiva are pink and non-injected, sclera clear SINUSES: No redness or tenderness over maxillary or ethmoid sinuses OROPHARYNX:no exudate, no erythema on lips, buccal mucosa, or tongue. NECK: supple, thyroid normal size, non-tender, without nodularity. No masses CHEST: normal AP diameter with light port in place. LYMPH:  no palpable lymphadenopathy in the cervical, axillary or inguinal LUNGS: clear to auscultation and percussion with normal breathing effort HEART: regular rate & rhythm and no  murmurs. ABDOMEN:abdomen soft, non-tender and normal bowel sounds. Liver and spleen not enlarged. MUSCULOSKELETAL:no cyanosis of digits and no clubbing. Range of motion normal.  NEURO: alert & oriented x 3 with fluent speech, no focal motor/sensory deficits   LABORATORY DATA: Infusion on 08/02/2014  Component Date Value Ref Range Status  . Retic Ct Pct 08/02/2014 1.7  0.4 - 3.1 % Final  . RBC. 08/02/2014 3.55* 4.22 - 5.81 MIL/uL Final  . Retic Count, Manual 08/02/2014 60.4  19.0 - 186.0 K/uL Final  . Sodium 08/02/2014 143  137 - 147 mEq/L Final  . Potassium 08/02/2014 4.1  3.7 - 5.3 mEq/L Final  . Chloride 08/02/2014 104  96 - 112 mEq/L Final  . CO2 08/02/2014 26  19 - 32 mEq/L Final  . Glucose, Bld 08/02/2014 143* 70 - 99 mg/dL Final  . BUN 08/02/2014 29* 6 - 23 mg/dL Final  . Creatinine, Ser 08/02/2014 1.35  0.50 - 1.35 mg/dL Final  . Calcium 08/02/2014 9.2  8.4 - 10.5 mg/dL Final  . Total Protein 08/02/2014 6.6  6.0 - 8.3 g/dL Final  . Albumin 08/02/2014 3.8  3.5 - 5.2 g/dL Final  . AST 08/02/2014 14  0 - 37 U/L Final  . ALT 08/02/2014 12  0 - 53 U/L Final  . Alkaline Phosphatase 08/02/2014 56  39 - 117 U/L Final  . Total Bilirubin 08/02/2014 0.5  0.3 - 1.2 mg/dL Final  . GFR calc non Af Amer 08/02/2014 49* >90 mL/min Final  . GFR calc Af Amer 08/02/2014 57* >90 mL/min Final   Comment: (NOTE) The eGFR has  been calculated using the CKD EPI equation. This calculation has not been validated in all clinical situations. eGFR's persistently <90 mL/min signify possible Chronic Kidney Disease.   . Anion gap 08/02/2014 13  5 - 15 Final  . LDH 08/02/2014 145  94 - 250 U/L Final  . WBC 08/02/2014 2.5* 4.0 - 10.5 K/uL Final  . RBC 08/02/2014 3.55* 4.22 - 5.81 MIL/uL Final  . Hemoglobin 08/02/2014 11.4* 13.0 - 17.0 g/dL Final  . HCT 08/02/2014 33.1* 39.0 - 52.0 % Final  . MCV 08/02/2014 93.2  78.0 - 100.0 fL Final  . MCH 08/02/2014 32.1  26.0 - 34.0 pg Final  . MCHC 08/02/2014 34.4   30.0 - 36.0 g/dL Final  . RDW 08/02/2014 14.6  11.5 - 15.5 % Final  . Platelets 08/02/2014 100* 150 - 400 K/uL Final   Comment: SPECIMEN CHECKED FOR CLOTS CONSISTENT WITH PREVIOUS RESULT   . Neutrophils Relative % 08/02/2014 68  43 - 77 % Final  . Neutro Abs 08/02/2014 1.7  1.7 - 7.7 K/uL Final  . Lymphocytes Relative 08/02/2014 18  12 - 46 % Final  . Lymphs Abs 08/02/2014 0.5* 0.7 - 4.0 K/uL Final  . Monocytes Relative 08/02/2014 9  3 - 12 % Final  . Monocytes Absolute 08/02/2014 0.2  0.1 - 1.0 K/uL Final  . Eosinophils Relative 08/02/2014 4  0 - 5 % Final  . Eosinophils Absolute 08/02/2014 0.1  0.0 - 0.7 K/uL Final  . Basophils Relative 08/02/2014 1  0 - 1 % Final  . Basophils Absolute 08/02/2014 0.0  0.0 - 0.1 K/uL Final    PATHOLOGY:no new pathology.  Urinalysis    Component Value Date/Time   COLORURINE YELLOW 09/19/2007 1940   APPEARANCEUR CLEAR 09/19/2007 1940   LABSPEC 1.020 09/19/2007 1940   PHURINE 5.5 09/19/2007 1940   GLUCOSEU >1000* 09/19/2007 1940   HGBUR TRACE* 09/19/2007 1940   BILIRUBINUR NEGATIVE 09/19/2007 1940   KETONESUR NEGATIVE 09/19/2007 1940   PROTEINUR NEGATIVE 09/19/2007 1940   UROBILINOGEN 0.2 09/19/2007 1940   NITRITE NEGATIVE 09/19/2007 1940   LEUKOCYTESUR NEGATIVE 09/19/2007 1940    RADIOGRAPHIC STUDIES: No results found.  ASSESSMENT:  1.Stage I-E. mantle cell lymphoma involving the colon, normal CT scans initially, normal colonoscopy performed on 02/28/2014 with original diagnosis made by colonoscopy only, status post 6 cycles of bendamustine/Rituxan, for cycle 1 of Rituxan maintenance today. 2. Ulcerative colitis, improved.  3. Depression and anxiety disorder, much improved., Still with fatigue.  4. Past history of prostate cancer, stable. 5. Tremors associated with Compazine.  6. Pancytopenia secondary to previous chemotherapy.   PLAN:  1. Rituxan 500 mg per meter squared IV today 2. Increase Lexapro to 20 mg at bedtime. 3. Call if  develops fever or GI discomfort. 4. Followup in 3 months with CBC, chem profile, beta 2 microglobulin, LDH   All questions were answered. The patient knows to call the clinic with any problems, questions or concerns. We can certainly see the patient much sooner if necessary.   I spent 25 minutes counseling the patient face to face. The total time spent in the appointment was 30 minutes.    Doroteo Bradford, MD 08/02/2014 9:38 AM  DISCLAIMER:  This note was dictated with voice recognition software.  Similar sounding words can inadvertently be transcribed inaccurately and may not be corrected upon review.

## 2014-08-02 NOTE — Progress Notes (Signed)
[name]'s reason for visit today is for an injection  [name] also received vitamin b12 per MD orders; see [facility] for administration details.  [name] tolerated all procedures well and without incident;

## 2014-08-02 NOTE — Progress Notes (Signed)
[name] Stuard Tolerated chemotherapy well today. Discharged ambulatory

## 2014-08-02 NOTE — Patient Instructions (Signed)
[facility] Discharge Instructions for Patients Receiving Chemotherapy  Today you received the following chemotherapy agents rituxan You saw Dr Claudean Kinds today Rituxan 500 mg per meter squared IV today Increase Lexapro to 20 mg at bedtime. Call if develops fever or GI discomfort. Followup in 3 months with CBC, chem profile, beta 2 microglobulin, LDH   To help prevent nausea and vomiting after your treatment, we encourage you to take your nausea medication     If you develop nausea and vomiting that is not controlled by your nausea medication, call the clinic. If it is after clinic hours your family physician or the after hours number for the clinic or go to the Emergency Department.   BELOW ARE SYMPTOMS THAT SHOULD BE REPORTED IMMEDIATELY:  *FEVER GREATER THAN 101.0 F  *CHILLS WITH OR WITHOUT FEVER  NAUSEA AND VOMITING THAT IS NOT CONTROLLED WITH YOUR NAUSEA MEDICATION  *UNUSUAL SHORTNESS OF BREATH  *UNUSUAL BRUISING OR BLEEDING  TENDERNESS IN MOUTH AND THROAT WITH OR WITHOUT PRESENCE OF ULCERS  *URINARY PROBLEMS  *BOWEL PROBLEMS  UNUSUAL RASH Items with * indicate a potential emergency and should be followed up as soon as possible.  One of the nurses will contact you 24 hours after your treatment. Please let the nurse know about any problems that you may have experienced. Feel free to call the clinic you have any questions or concerns. The clinic phone number is [phone].   I have been informed and understand all the instructions given to me. I know to contact the clinic, my physician, or go to the Emergency Department if any problems should occur. I do not have any questions at this time, but understand that I may call the clinic during office hours or the Patient Navigator at [phone] should I have any questions or need assistance in obtaining follow up care.   Rituximab injection What is this medicine? RITUXIMAB (ri TUX i mab) is a monoclonal  antibody. This medicine changes the way the body's immune system works. It is used commonly to treat non-Hodgkin's lymphoma and other conditions. In cancer cells, this drug targets a specific protein within cancer cells and stops the cancer cells from growing. It is also used to treat rhuematoid arthritis (RA). In RA, this medicine slow the inflammatory process and help reduce joint pain and swelling. This medicine is often used with other cancer or arthritis medications. This medicine may be used for other purposes; ask your health care provider or pharmacist if you have questions. COMMON BRAND NAME(S): Rituxan What should I tell my health care provider before I take this medicine? They need to know if you have any of these conditions: -blood disorders -heart disease -history of hepatitis B -infection (especially a virus infection such as chickenpox, cold sores, or herpes) -irregular heartbeat -kidney disease -lung or breathing disease, like asthma -lupus -an unusual or allergic reaction to rituximab, mouse proteins, other medicines, foods, dyes, or preservatives -pregnant or trying to get pregnant -breast-feeding How should I use this medicine? This medicine is for infusion into a vein. It is administered in a hospital or clinic by a specially trained health care professional. A special MedGuide will be given to you by the pharmacist with each prescription and refill. Be sure to read this information carefully each time. Talk to your pediatrician regarding the use of this medicine in children. This medicine is not approved for use in children. Overdosage: If you think you have taken too much of this medicine  contact a poison control center or emergency room at once. NOTE: This medicine is only for you. Do not share this medicine with others. What if I miss a dose? It is important not to miss a dose. Call your doctor or health care professional if you are unable to keep an appointment. What  may interact with this medicine? -cisplatin -medicines for blood pressure -some other medicines for arthritis -vaccines This list may not describe all possible interactions. Give your health care provider a list of all the medicines, herbs, non-prescription drugs, or dietary supplements you use. Also tell them if you smoke, drink alcohol, or use illegal drugs. Some items may interact with your medicine. What should I watch for while using this medicine? Report any side effects that you notice during your treatment right away, such as changes in your breathing, fever, chills, dizziness or lightheadedness. These effects are more common with the first dose. Visit your prescriber or health care professional for checks on your progress. You will need to have regular blood work. Report any other side effects. The side effects of this medicine can continue after you finish your treatment. Continue your course of treatment even though you feel ill unless your doctor tells you to stop. Call your doctor or health care professional for advice if you get a fever, chills or sore throat, or other symptoms of a cold or flu. Do not treat yourself. This drug decreases your body's ability to fight infections. Try to avoid being around people who are sick. This medicine may increase your risk to bruise or bleed. Call your doctor or health care professional if you notice any unusual bleeding. Be careful brushing and flossing your teeth or using a toothpick because you may get an infection or bleed more easily. If you have any dental work done, tell your dentist you are receiving this medicine. Avoid taking products that contain aspirin, acetaminophen, ibuprofen, naproxen, or ketoprofen unless instructed by your doctor. These medicines may hide a fever. Do not become pregnant while taking this medicine. Women should inform their doctor if they wish to become pregnant or think they might be pregnant. There is a potential for  serious side effects to an unborn child. Talk to your health care professional or pharmacist for more information. Do not breast-feed an infant while taking this medicine. What side effects may I notice from receiving this medicine? Side effects that you should report to your doctor or health care professional as soon as possible: -allergic reactions like skin rash, itching or hives, swelling of the face, lips, or tongue -low blood counts - this medicine may decrease the number of white blood cells, red blood cells and platelets. You may be at increased risk for infections and bleeding. -signs of infection - fever or chills, cough, sore throat, pain or difficulty passing urine -signs of decreased platelets or bleeding - bruising, pinpoint red spots on the skin, black, tarry stools, blood in the urine -signs of decreased red blood cells - unusually weak or tired, fainting spells, lightheadedness -breathing problems -confused, not responsive -chest pain -fast, irregular heartbeat -feeling faint or lightheaded, falls -mouth sores -redness, blistering, peeling or loosening of the skin, including inside the mouth -stomach pain -swelling of the ankles, feet, or hands -trouble passing urine or change in the amount of urine Side effects that usually do not require medical attention (report to your doctor or other health care professional if they continue or are bothersome): -anxiety -headache -loss of appetite -muscle aches -  nausea -night sweats This list may not describe all possible side effects. Call your doctor for medical advice about side effects. You may report side effects to FDA at 1-800-FDA-1088. Where should I keep my medicine? This drug is given in a hospital or clinic and will not be stored at home. NOTE: This sheet is a summary. It may not cover all possible information. If you have questions about this medicine, talk to your doctor, pharmacist, or health care provider.  2015,  Elsevier/Gold Standard. (2008-05-09 14:04:59)

## 2014-08-03 LAB — PSA

## 2014-08-04 LAB — BETA 2 MICROGLOBULIN, SERUM: BETA 2 MICROGLOBULIN: 4.59 mg/L — AB (ref <=2.51)

## 2014-08-08 DIAGNOSIS — H01009 Unspecified blepharitis unspecified eye, unspecified eyelid: Secondary | ICD-10-CM | POA: Diagnosis not present

## 2014-08-22 DIAGNOSIS — E119 Type 2 diabetes mellitus without complications: Secondary | ICD-10-CM | POA: Diagnosis not present

## 2014-08-22 DIAGNOSIS — E785 Hyperlipidemia, unspecified: Secondary | ICD-10-CM | POA: Diagnosis not present

## 2014-08-22 DIAGNOSIS — I10 Essential (primary) hypertension: Secondary | ICD-10-CM | POA: Diagnosis not present

## 2014-08-29 DIAGNOSIS — G252 Other specified forms of tremor: Secondary | ICD-10-CM | POA: Diagnosis not present

## 2014-08-29 DIAGNOSIS — E119 Type 2 diabetes mellitus without complications: Secondary | ICD-10-CM | POA: Diagnosis not present

## 2014-08-29 DIAGNOSIS — E782 Mixed hyperlipidemia: Secondary | ICD-10-CM | POA: Diagnosis not present

## 2014-08-29 DIAGNOSIS — D696 Thrombocytopenia, unspecified: Secondary | ICD-10-CM | POA: Diagnosis not present

## 2014-08-31 DIAGNOSIS — H00025 Hordeolum internum left lower eyelid: Secondary | ICD-10-CM | POA: Diagnosis not present

## 2014-09-01 ENCOUNTER — Ambulatory Visit (HOSPITAL_COMMUNITY): Payer: Medicare Other

## 2014-09-02 ENCOUNTER — Encounter (HOSPITAL_COMMUNITY): Payer: Medicare Other | Attending: Hematology and Oncology

## 2014-09-02 DIAGNOSIS — C8310 Mantle cell lymphoma, unspecified site: Secondary | ICD-10-CM | POA: Diagnosis not present

## 2014-09-02 MED ORDER — CYANOCOBALAMIN 1000 MCG/ML IJ SOLN
1000.0000 ug | Freq: Once | INTRAMUSCULAR | Status: AC
  Administered 2014-09-02: 1000 ug via INTRAMUSCULAR

## 2014-09-02 MED ORDER — CYANOCOBALAMIN 1000 MCG/ML IJ SOLN
INTRAMUSCULAR | Status: AC
  Filled 2014-09-02: qty 1

## 2014-09-02 NOTE — Progress Notes (Signed)
[name] presents today for injection per the provider's orders.  B-12administration without incident; see MAR for injection details.  Patient tolerated procedure well and without incident.  No questions or complaints noted at this time.

## 2014-09-26 ENCOUNTER — Other Ambulatory Visit (HOSPITAL_COMMUNITY): Payer: Self-pay | Admitting: Internal Medicine

## 2014-09-26 ENCOUNTER — Ambulatory Visit (HOSPITAL_COMMUNITY)
Admission: RE | Admit: 2014-09-26 | Discharge: 2014-09-26 | Disposition: A | Discharge status: Home / Self Care | Payer: Medicare Other | Source: Ambulatory Visit | Attending: Internal Medicine | Admitting: Internal Medicine

## 2014-09-26 DIAGNOSIS — E782 Mixed hyperlipidemia: Secondary | ICD-10-CM | POA: Diagnosis not present

## 2014-09-26 DIAGNOSIS — M7532 Calcific tendinitis of left shoulder: Secondary | ICD-10-CM | POA: Insufficient documentation

## 2014-09-26 DIAGNOSIS — K210 Gastro-esophageal reflux disease with esophagitis: Secondary | ICD-10-CM | POA: Diagnosis not present

## 2014-09-26 DIAGNOSIS — R52 Pain, unspecified: Secondary | ICD-10-CM

## 2014-09-26 DIAGNOSIS — Z85038 Personal history of other malignant neoplasm of large intestine: Secondary | ICD-10-CM | POA: Diagnosis not present

## 2014-09-26 DIAGNOSIS — I10 Essential (primary) hypertension: Secondary | ICD-10-CM | POA: Diagnosis not present

## 2014-09-26 DIAGNOSIS — E119 Type 2 diabetes mellitus without complications: Secondary | ICD-10-CM | POA: Diagnosis not present

## 2014-09-26 DIAGNOSIS — M25512 Pain in left shoulder: Secondary | ICD-10-CM | POA: Diagnosis present

## 2014-09-30 DIAGNOSIS — E1342 Other specified diabetes mellitus with diabetic polyneuropathy: Secondary | ICD-10-CM | POA: Diagnosis not present

## 2014-10-03 ENCOUNTER — Encounter (HOSPITAL_COMMUNITY): Payer: Medicare Other | Attending: Hematology and Oncology

## 2014-10-03 DIAGNOSIS — E538 Deficiency of other specified B group vitamins: Secondary | ICD-10-CM

## 2014-10-03 DIAGNOSIS — C8310 Mantle cell lymphoma, unspecified site: Secondary | ICD-10-CM | POA: Insufficient documentation

## 2014-10-03 MED ORDER — CYANOCOBALAMIN 1000 MCG/ML IJ SOLN
1000.0000 ug | Freq: Once | INTRAMUSCULAR | Status: AC
  Administered 2014-10-03: 1000 ug via INTRAMUSCULAR

## 2014-10-03 MED ORDER — CYANOCOBALAMIN 1000 MCG/ML IJ SOLN
INTRAMUSCULAR | Status: AC
  Filled 2014-10-03: qty 1

## 2014-10-03 NOTE — Progress Notes (Signed)
[name] presents today for injection per the provider's orders.  Vitamin B12 administration without incident; see MAR for injection details.  Patient tolerated procedure well and without incident.  No questions or complaints noted at this time.

## 2014-10-28 DIAGNOSIS — E1142 Type 2 diabetes mellitus with diabetic polyneuropathy: Secondary | ICD-10-CM | POA: Diagnosis not present

## 2014-10-28 DIAGNOSIS — B351 Tinea unguium: Secondary | ICD-10-CM | POA: Diagnosis not present

## 2014-10-28 DIAGNOSIS — L851 Acquired keratosis [keratoderma] palmaris et plantaris: Secondary | ICD-10-CM | POA: Diagnosis not present

## 2014-10-31 ENCOUNTER — Encounter (HOSPITAL_BASED_OUTPATIENT_CLINIC_OR_DEPARTMENT_OTHER): Payer: Medicare Other

## 2014-10-31 ENCOUNTER — Encounter (HOSPITAL_COMMUNITY): Payer: Self-pay | Admitting: Hematology & Oncology

## 2014-10-31 ENCOUNTER — Telehealth (HOSPITAL_COMMUNITY): Payer: Self-pay | Admitting: *Deleted

## 2014-10-31 ENCOUNTER — Encounter (HOSPITAL_COMMUNITY): Payer: Self-pay

## 2014-10-31 ENCOUNTER — Encounter (HOSPITAL_COMMUNITY): Payer: Medicare Other | Attending: Hematology and Oncology | Admitting: Hematology & Oncology

## 2014-10-31 VITALS — BP 127/69 | HR 72 | Temp 96.9°F | Resp 20 | Wt 232.1 lb

## 2014-10-31 DIAGNOSIS — C8339 Diffuse large B-cell lymphoma, extranodal and solid organ sites: Secondary | ICD-10-CM

## 2014-10-31 DIAGNOSIS — C8310 Mantle cell lymphoma, unspecified site: Secondary | ICD-10-CM

## 2014-10-31 DIAGNOSIS — D72819 Decreased white blood cell count, unspecified: Secondary | ICD-10-CM

## 2014-10-31 DIAGNOSIS — Z5112 Encounter for antineoplastic immunotherapy: Secondary | ICD-10-CM | POA: Diagnosis not present

## 2014-10-31 LAB — COMPREHENSIVE METABOLIC PANEL
ALK PHOS: 38 U/L — AB (ref 39–117)
ALT: 17 U/L (ref 0–53)
ANION GAP: 6 (ref 5–15)
AST: 22 U/L (ref 0–37)
Albumin: 3.9 g/dL (ref 3.5–5.2)
BUN: 32 mg/dL — ABNORMAL HIGH (ref 6–23)
CO2: 25 mmol/L (ref 19–32)
Calcium: 8.6 mg/dL (ref 8.4–10.5)
Chloride: 109 mmol/L (ref 96–112)
Creatinine, Ser: 1.14 mg/dL (ref 0.50–1.35)
GFR calc non Af Amer: 60 mL/min — ABNORMAL LOW (ref >90)
GFR, EST AFRICAN AMERICAN: 70 mL/min — AB (ref >90)
Glucose, Bld: 215 mg/dL — ABNORMAL HIGH (ref 70–99)
Potassium: 4.4 mmol/L (ref 3.5–5.1)
SODIUM: 140 mmol/L (ref 135–145)
TOTAL PROTEIN: 6.4 g/dL (ref 6.0–8.3)
Total Bilirubin: 0.6 mg/dL (ref 0.3–1.2)

## 2014-10-31 LAB — CBC WITH DIFFERENTIAL/PLATELET
Basophils Absolute: 0 10*3/uL (ref 0.0–0.1)
Basophils Relative: 0 % (ref 0–1)
Eosinophils Absolute: 0.1 10*3/uL (ref 0.0–0.7)
Eosinophils Relative: 1 % (ref 0–5)
HEMATOCRIT: 31.9 % — AB (ref 39.0–52.0)
Hemoglobin: 10.7 g/dL — ABNORMAL LOW (ref 13.0–17.0)
LYMPHS PCT: 9 % — AB (ref 12–46)
Lymphs Abs: 0.5 10*3/uL — ABNORMAL LOW (ref 0.7–4.0)
MCH: 31.6 pg (ref 26.0–34.0)
MCHC: 33.5 g/dL (ref 30.0–36.0)
MCV: 94.1 fL (ref 78.0–100.0)
MONO ABS: 0.3 10*3/uL (ref 0.1–1.0)
MONOS PCT: 5 % (ref 3–12)
Neutro Abs: 4.4 10*3/uL (ref 1.7–7.7)
Neutrophils Relative %: 85 % — ABNORMAL HIGH (ref 43–77)
PLATELETS: 95 10*3/uL — AB (ref 150–400)
RBC: 3.39 MIL/uL — ABNORMAL LOW (ref 4.22–5.81)
RDW: 15.3 % (ref 11.5–15.5)
WBC: 5.1 10*3/uL (ref 4.0–10.5)

## 2014-10-31 LAB — LACTATE DEHYDROGENASE: LDH: 187 U/L (ref 94–250)

## 2014-10-31 MED ORDER — CYANOCOBALAMIN 1000 MCG/ML IJ SOLN
INTRAMUSCULAR | Status: AC
  Filled 2014-10-31: qty 1

## 2014-10-31 MED ORDER — HEPARIN SOD (PORK) LOCK FLUSH 100 UNIT/ML IV SOLN
500.0000 [IU] | Freq: Once | INTRAVENOUS | Status: AC | PRN
  Administered 2014-10-31: 500 [IU]

## 2014-10-31 MED ORDER — SODIUM CHLORIDE 0.9 % IV SOLN
500.0000 mg/m2 | Freq: Once | INTRAVENOUS | Status: AC
  Administered 2014-10-31: 1100 mg via INTRAVENOUS
  Filled 2014-10-31: qty 110

## 2014-10-31 MED ORDER — ACYCLOVIR 400 MG PO TABS: 400.0000 mg | ORAL_TABLET | Freq: Two times a day (BID) | ORAL | Status: DC

## 2014-10-31 MED ORDER — ACETAMINOPHEN 325 MG PO TABS: 650.0000 mg | ORAL_TABLET | Freq: Once | ORAL | Status: DC

## 2014-10-31 MED ORDER — HEPARIN SOD (PORK) LOCK FLUSH 100 UNIT/ML IV SOLN
INTRAVENOUS | Status: AC
  Filled 2014-10-31: qty 5

## 2014-10-31 MED ORDER — DIPHENHYDRAMINE HCL 25 MG PO CAPS: 50.0000 mg | ORAL_CAPSULE | Freq: Once | ORAL | Status: DC

## 2014-10-31 MED ORDER — CYANOCOBALAMIN 1000 MCG/ML IJ SOLN
1000.0000 ug | Freq: Once | INTRAMUSCULAR | Status: AC
  Administered 2014-10-31: 1000 ug via INTRAMUSCULAR

## 2014-10-31 MED ORDER — SODIUM CHLORIDE 0.9 % IJ SOLN: 10.0000 mL | INTRAMUSCULAR | Status: DC | PRN

## 2014-10-31 MED ORDER — SODIUM CHLORIDE 0.9 % IV SOLN
Freq: Once | INTRAVENOUS | Status: AC
  Administered 2014-10-31: 09:00:00 via INTRAVENOUS

## 2014-10-31 NOTE — Assessment & Plan Note (Signed)
He had questions in regards to his leukopenia.  I spent time explaining the difference between his prior chemotherapy and "neulasta" versus the effects of Rituxan on his wbc count.  I also explained that currently he cannot have a shingles vaccine.  I have recommended acyclovir for HSV prevention.  He is very concerned about getting the shingles.  We discussed signs/symptoms of shingles and I advised him to call with any problems or concerns.

## 2014-10-31 NOTE — Telephone Encounter (Signed)
At patients request acyclovir (ZOVIRAX) 400 MG tablet (take 1 tablet 2 times daily) was called to [facility], his mail order pharmacy. I cancelled the previous Rx that was at [facility]. I called in a 3 month supply with refill

## 2014-10-31 NOTE — Telephone Encounter (Signed)
error 

## 2014-10-31 NOTE — Progress Notes (Signed)
[name] Tolerated chemotherapy today.  Discharged ambulatory

## 2014-10-31 NOTE — Patient Instructions (Addendum)
[place] Discharge Instructions  RECOMMENDATIONS MADE BY THE CONSULTANT AND ANY TEST RESULTS WILL BE SENT TO YOUR REFERRING PHYSICIAN.  SPECIAL INSTRUCTIONS/FOLLOW-UP:  Please call prior to your next visit with any problems or concerns. We will see you back again in three months with labs and an office visit. We have called in the following prescriptions: acyclovir 400 mg twice daily for shingles prevention     Thank you for choosing [facility] to provide your oncology and hematology care.  To afford each patient quality time with our providers, please arrive at least 15 minutes before your scheduled appointment time.  With your help, our goal is to use those 15 minutes to complete the necessary work-up to ensure our physicians have the information they need to help with your evaluation and healthcare recommendations.    Effective January 1st, 2014, we ask that you re-schedule your appointment with our physicians should you arrive 10 or more minutes late for your appointment.  We strive to give you quality time with our providers, and arriving late affects you and other patients whose appointments are after yours.    Again, thank you for choosing [facility].  Our hope is that these requests will decrease the amount of time that you wait before being seen by our physicians.       _____________________________________________________________  Should you have questions after your visit to [facility], please contact our office at [phone] between the hours of 8:30 a.m. and 5:00 p.m.  Voicemails left after 4:30 p.m. will not be returned until the following business day.  For prescription refill requests, have your pharmacy contact our office with your prescription refill request.

## 2014-10-31 NOTE — Progress Notes (Signed)
Brandon Cahill, MD  [place] [address]    DIAGNOSIS: Mantel Cell lymphoma of the large intestine diagnosed 08/2013 Colonoscopy on 02/28/2014 no evidence of disease  SUMMARY OF ONCOLOGIC HISTORY:   Mantle cell lymphoma   08/27/2013 Initial Diagnosis Colon, biopsy, random - MANTLE CELL LYMPHOMA   09/06/2013 Imaging CT CAP- No significant lymphadenopathy identified within the chest, abdomen or pelvis.   09/14/2013 - 02/07/2014 Chemotherapy Bendamustine/Rituxan x 6 cycles with Neulasta support   05/10/2014 -  Chemotherapy maintenance Rituxan 500 mg/m cycle 1    CURRENT THERAPY: Rituxan  INTERVAL HISTORY: [name] 80 y.o. male returns for follow-up of his mantle cell lymphoma.  He is doing well. He has gained back all of his weight and because of his diabetes that frustrates him some.  He denies abdominal pain, nausea, night sweats, or fever.  He is worried about shingles  He is inquiring whether or not he can receive a shingles vaccine.   MEDICAL HISTORY: Past Medical History  Diagnosis Date  . Type 2 diabetes mellitus   . 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 Mixed hyperlipidemia   . PVD (peripheral vascular disease)   . Essential hypertension, benign   . Prostate cancer   . Colon cancer   . B12 deficiency 02/07/2014  . Obstructive sleep apnea     does not use  . GERD (gastroesophageal reflux disease)     has Mixed hyperlipidemia; HYPERTENSION, BENIGN; Coronary atherosclerosis of native coronary artery; VASOMOTOR RHINITIS; GERD; OSA (obstructive sleep apnea); H/O ulcerative colitis; Mantle cell lymphoma; Prostate cancer; Proctitis, radiation; Abdominal pain, left lower quadrant; Abdominal pain, unspecified site; Lower GI bleed; Diverticulitis; Leukopenia; Diverticulosis of colon with hemorrhage; Thrombocytopenia, unspecified; Acute blood loss anemia; and B12  deficiency on his problem list.     is allergic to bee venom; adhesive; ciprofloxacin; codeine; iodine; latex; povidone-iodine; and simvastatin.  Mr. Haig does not currently have medications on file.  SURGICAL HISTORY: Past Surgical History  Procedure Laterality Date  . Tonsillectomy    . Back surgery    . Vasectomy    . Knee surgery Right     total knee  . Shoulder surgery    . Prostatectomy    . Colonoscopy with esophagogastroduodenoscopy (egd) N/A 08/27/2013    Procedure: COLONOSCOPY WITH ESOPHAGOGASTRODUODENOSCOPY (EGD);  Surgeon: Rogene Houston, MD;  [facility];  Service: Endoscopy;  Laterality: N/A;  730  . Balloon dilation N/A 08/27/2013    Procedure: BALLOON DILATION;  Surgeon: Rogene Houston, MD;  Location: [facility];  Service: Endoscopy;  Laterality: N/A;  Venia Minks dilation N/A 08/27/2013    Procedure: Venia Minks DILATION;  Surgeon: Rogene Houston, MD;  Location: [facility];  Service: Endoscopy;  Laterality: N/A;  . Savory dilation N/A 08/27/2013    Procedure: SAVORY DILATION;  Surgeon: Rogene Houston, MD;  Location: [facility];  Service: Endoscopy;  Laterality: N/A;  . Portacath placement Right 2014  . Colonoscopy N/A 12/17/2013    Procedure: COLONOSCOPY;  Surgeon: Rogene Houston, MD;  Location: [facility];  Service: Endoscopy;  Laterality: N/A;  940  . Colon surgery    . Knee arthroscopy Left   . Removal of port Right   . Colonoscopy N/A 02/28/2014    Procedure: COLONOSCOPY;  Surgeon: Rogene Houston, MD;  [facility]: [facility];  Service: Endoscopy;  Laterality: N/A;  730    SOCIAL HISTORY: History   Social History  . Marital Status: Married    Spouse Name: N/A    Number of Children: N/A  . Years of Education: N/A   Occupational History  . retired    Social History Main Topics  . Smoking status: Never Smoker   . Smokeless tobacco: Never Used  . Alcohol Use: No  . Drug Use: No  . Sexual Activity: Yes    Birth Control/ Protection:  None   Other Topics Concern  . Not on file   Social History Narrative    FAMILY HISTORY: Family History  Problem Relation Age of Onset  . Colon cancer Brother   . Cancer Brother   . Diabetes Father   . Cancer Brother     Review of Systems  Constitutional: Positive for malaise/fatigue.  HENT: Negative.   Eyes: Negative.   Respiratory: Negative.   Cardiovascular: Negative.   Gastrointestinal: Negative.   Genitourinary: Negative.   Musculoskeletal: Negative.   Skin: Negative.   Neurological: Positive for weakness. Negative for dizziness, tingling, tremors, sensory change, speech change, focal weakness, seizures and loss of consciousness.  Endo/Heme/Allergies: Negative.   Psychiatric/Behavioral: Negative.     PHYSICAL EXAMINATION  ECOG PERFORMANCE STATUS: 1 - Symptomatic but completely ambulatory  Filed Vitals:   10/31/14 0850  BP: 127/69  Pulse: 72  Temp: 96.9 F (36.1 C)  Resp: 20    Physical Exam  Constitutional: He is oriented to person, place, and time and well-developed, well-nourished, and in no distress.  HENT:  Head: Normocephalic and atraumatic.  Nose: Nose normal.  Mouth/Throat: Oropharynx is clear and moist. No oropharyngeal exudate.  Eyes: Conjunctivae and EOM are normal. Pupils are equal, round, and reactive to light. Right eye exhibits no discharge. Left eye exhibits no discharge. No scleral icterus.  Neck: Normal range of motion. Neck supple. No tracheal deviation present. No thyromegaly present.  Cardiovascular: Normal rate, regular rhythm and normal heart sounds.  Exam reveals no gallop and no friction rub.   No murmur heard. Pulmonary/Chest: Effort normal and breath sounds normal. He has no wheezes. He has no rales.  Abdominal: Soft. Bowel sounds are normal. He exhibits no distension and no mass. There is no tenderness. There is no rebound and no guarding.  Musculoskeletal: Normal range of motion. He exhibits no edema.  Lymphadenopathy:        Head (right side): No submental and no submandibular adenopathy present.       Head (left side): No submental and no submandibular adenopathy present.    He has no cervical adenopathy.       Right axillary: No pectoral and no lateral adenopathy present.       Left axillary: No pectoral and no lateral adenopathy present.      Right: No inguinal and no supraclavicular adenopathy present.       Left: No inguinal and no supraclavicular adenopathy present.  Neurological: He is alert and oriented to person, place, and time. He has normal reflexes. No cranial nerve deficit. Gait normal. Coordination normal.  Skin: Skin is warm and dry. No rash noted.  Psychiatric: Mood, memory, affect and judgment normal.  Nursing note and vitals reviewed.   LABORATORY DATA:  CBC    Component Value Date/Time   WBC 5.1 10/31/2014 0900   RBC 3.39* 10/31/2014 0900   RBC 3.55* 08/02/2014 0825   HGB 10.7* 10/31/2014 0900   HCT 31.9* 10/31/2014  0900   PLT 95* 10/31/2014 0900   MCV 94.1 10/31/2014 0900   MCH 31.6 10/31/2014 0900   MCHC 33.5 10/31/2014 0900   RDW 15.3 10/31/2014 0900   LYMPHSABS 0.5* 10/31/2014 0900   MONOABS 0.3 10/31/2014 0900   EOSABS 0.1 10/31/2014 0900   BASOSABS 0.0 10/31/2014 0900   CMP     Component Value Date/Time   NA 140 10/31/2014 0900   K 4.4 10/31/2014 0900   CL 109 10/31/2014 0900   CO2 25 10/31/2014 0900   GLUCOSE 215* 10/31/2014 0900   BUN 32* 10/31/2014 0900   CREATININE 1.14 10/31/2014 0900   CALCIUM 8.6 10/31/2014 0900   PROT 6.4 10/31/2014 0900   ALBUMIN 3.9 10/31/2014 0900   AST 22 10/31/2014 0900   ALT 17 10/31/2014 0900   ALKPHOS 38* 10/31/2014 0900   BILITOT 0.6 10/31/2014 0900   GFRNONAA 60* 10/31/2014 0900   GFRAA 70* 10/31/2014 0900      ASSESSMENT and THERAPY PLAN:    Mantle cell lymphoma 80 year old male with mantle cell lymphoma found on screening colonoscopy. He is currently doing well. He is on maintenance Rituxan and doing well.  He would  like to leave it at current 3 month dosing. I will see him back again in 3 months with labs.  He knows to call sooner if needed.    Leukopenia He had questions in regards to his leukopenia.  I spent time explaining the difference between his prior chemotherapy and "neulasta" versus the effects of Rituxan on his wbc count.  I also explained that currently he cannot have a shingles vaccine.  I have recommended acyclovir for HSV prevention.  He is very concerned about getting the shingles.  We discussed signs/symptoms of shingles and I advised him to call with any problems or concerns.     All questions were answered. The patient knows to call the clinic with any problems, questions or concerns. We can certainly see the patient much sooner if necessary.  Molli Hazard 10/31/2014

## 2014-10-31 NOTE — Assessment & Plan Note (Signed)
80 year old male with mantle cell lymphoma found on screening colonoscopy. He is currently doing well. He is on maintenance Rituxan and doing well.  He would like to leave it at current 3 month dosing. I will see him back again in 3 months with labs.  He knows to call sooner if needed.

## 2014-10-31 NOTE — Patient Instructions (Signed)
[facility] Discharge Instructions for Patients Receiving Chemotherapy  Today you received the following chemotherapy agents rituxan Please follow up as scheduled, call the clinic if you have any questions or concerns  To help prevent nausea and vomiting after your treatment, we encourage you to take your nausea medication    If you develop nausea and vomiting that is not controlled by your nausea medication, call the clinic. If it is after clinic hours your family physician or the after hours number for the clinic or go to the Emergency Department.   BELOW ARE SYMPTOMS THAT SHOULD BE REPORTED IMMEDIATELY:  *FEVER GREATER THAN 101.0 F  *CHILLS WITH OR WITHOUT FEVER  NAUSEA AND VOMITING THAT IS NOT CONTROLLED WITH YOUR NAUSEA MEDICATION  *UNUSUAL SHORTNESS OF BREATH  *UNUSUAL BRUISING OR BLEEDING  TENDERNESS IN MOUTH AND THROAT WITH OR WITHOUT PRESENCE OF ULCERS  *URINARY PROBLEMS  *BOWEL PROBLEMS  UNUSUAL RASH Items with * indicate a potential emergency and should be followed up as soon as possible.  One of the nurses will contact you 24 hours after your treatment. Please let the nurse know about any problems that you may have experienced. Feel free to call the clinic you have any questions or concerns. The clinic phone number is [phone].   I have been informed and understand all the instructions given to me. I know to contact the clinic, my physician, or go to the Emergency Department if any problems should occur. I do not have any questions at this time, but understand that I may call the clinic during office hours or the Patient Navigator at [phone] should I have any questions or need assistance in obtaining follow up care.   Rituximab injection What is this medicine? RITUXIMAB (ri TUX i mab) is a monoclonal antibody. This medicine changes the way the body's immune system works. It is used commonly to treat non-Hodgkin's lymphoma and other  conditions. In cancer cells, this drug targets a specific protein within cancer cells and stops the cancer cells from growing. It is also used to treat rhuematoid arthritis (RA). In RA, this medicine slow the inflammatory process and help reduce joint pain and swelling. This medicine is often used with other cancer or arthritis medications. This medicine may be used for other purposes; ask your health care provider or pharmacist if you have questions. COMMON BRAND NAME(S): Rituxan What should I tell my health care provider before I take this medicine? They need to know if you have any of these conditions: -blood disorders -heart disease -history of hepatitis B -infection (especially a virus infection such as chickenpox, cold sores, or herpes) -irregular heartbeat -kidney disease -lung or breathing disease, like asthma -lupus -an unusual or allergic reaction to rituximab, mouse proteins, other medicines, foods, dyes, or preservatives -pregnant or trying to get pregnant -breast-feeding How should I use this medicine? This medicine is for infusion into a vein. It is administered in a hospital or clinic by a specially trained health care professional. A special MedGuide will be given to you by the pharmacist with each prescription and refill. Be sure to read this information carefully each time. Talk to your pediatrician regarding the use of this medicine in children. This medicine is not approved for use in children. Overdosage: If you think you have taken too much of this medicine contact a poison control center or emergency room at once. NOTE: This medicine is only for you. Do not share this medicine with others. What if  I miss a dose? It is important not to miss a dose. Call your doctor or health care professional if you are unable to keep an appointment. What may interact with this medicine? -cisplatin -medicines for blood pressure -some other medicines for arthritis -vaccines This list  may not describe all possible interactions. Give your health care provider a list of all the medicines, herbs, non-prescription drugs, or dietary supplements you use. Also tell them if you smoke, drink alcohol, or use illegal drugs. Some items may interact with your medicine. What should I watch for while using this medicine? Report any side effects that you notice during your treatment right away, such as changes in your breathing, fever, chills, dizziness or lightheadedness. These effects are more common with the first dose. Visit your prescriber or health care professional for checks on your progress. You will need to have regular blood work. Report any other side effects. The side effects of this medicine can continue after you finish your treatment. Continue your course of treatment even though you feel ill unless your doctor tells you to stop. Call your doctor or health care professional for advice if you get a fever, chills or sore throat, or other symptoms of a cold or flu. Do not treat yourself. This drug decreases your body's ability to fight infections. Try to avoid being around people who are sick. This medicine may increase your risk to bruise or bleed. Call your doctor or health care professional if you notice any unusual bleeding. Be careful brushing and flossing your teeth or using a toothpick because you may get an infection or bleed more easily. If you have any dental work done, tell your dentist you are receiving this medicine. Avoid taking products that contain aspirin, acetaminophen, ibuprofen, naproxen, or ketoprofen unless instructed by your doctor. These medicines may hide a fever. Do not become pregnant while taking this medicine. Women should inform their doctor if they wish to become pregnant or think they might be pregnant. There is a potential for serious side effects to an unborn child. Talk to your health care professional or pharmacist for more information. Do not breast-feed  an infant while taking this medicine. What side effects may I notice from receiving this medicine? Side effects that you should report to your doctor or health care professional as soon as possible: -allergic reactions like skin rash, itching or hives, swelling of the face, lips, or tongue -low blood counts - this medicine may decrease the number of white blood cells, red blood cells and platelets. You may be at increased risk for infections and bleeding. -signs of infection - fever or chills, cough, sore throat, pain or difficulty passing urine -signs of decreased platelets or bleeding - bruising, pinpoint red spots on the skin, black, tarry stools, blood in the urine -signs of decreased red blood cells - unusually weak or tired, fainting spells, lightheadedness -breathing problems -confused, not responsive -chest pain -fast, irregular heartbeat -feeling faint or lightheaded, falls -mouth sores -redness, blistering, peeling or loosening of the skin, including inside the mouth -stomach pain -swelling of the ankles, feet, or hands -trouble passing urine or change in the amount of urine Side effects that usually do not require medical attention (report to your doctor or other health care professional if they continue or are bothersome): -anxiety -headache -loss of appetite -muscle aches -nausea -night sweats This list may not describe all possible side effects. Call your doctor for medical advice about side effects. You may report side effects  to FDA at 1-800-FDA-1088. Where should I keep my medicine? This drug is given in a hospital or clinic and will not be stored at home. NOTE: This sheet is a summary. It may not cover all possible information. If you have questions about this medicine, talk to your doctor, pharmacist, or health care provider.  2015, Elsevier/Gold Standard. (2008-05-09 14:04:59)

## 2014-10-31 NOTE — Progress Notes (Signed)
Please see rituxan encounter for more information

## 2014-11-01 LAB — BETA 2 MICROGLOBULIN, SERUM: Beta-2 Microglobulin: 2.3 mg/L (ref 0.6–2.4)

## 2014-11-02 ENCOUNTER — Telehealth (HOSPITAL_COMMUNITY): Payer: Self-pay

## 2014-11-02 ENCOUNTER — Ambulatory Visit (HOSPITAL_COMMUNITY): Payer: Medicare Other

## 2014-11-02 ENCOUNTER — Inpatient Hospital Stay (HOSPITAL_COMMUNITY): Payer: Medicare Other

## 2014-11-02 NOTE — Telephone Encounter (Signed)
A user error has taken place: encounter opened in error, closed for administrative reasons.

## 2014-11-21 DIAGNOSIS — L718 Other rosacea: Secondary | ICD-10-CM | POA: Diagnosis not present

## 2014-11-21 DIAGNOSIS — L298 Other pruritus: Secondary | ICD-10-CM | POA: Diagnosis not present

## 2014-11-21 DIAGNOSIS — L570 Actinic keratosis: Secondary | ICD-10-CM | POA: Diagnosis not present

## 2014-11-21 DIAGNOSIS — L814 Other melanin hyperpigmentation: Secondary | ICD-10-CM | POA: Diagnosis not present

## 2014-11-21 DIAGNOSIS — L309 Dermatitis, unspecified: Secondary | ICD-10-CM | POA: Diagnosis not present

## 2014-11-21 DIAGNOSIS — Z85828 Personal history of other malignant neoplasm of skin: Secondary | ICD-10-CM | POA: Diagnosis not present

## 2014-11-21 DIAGNOSIS — L821 Other seborrheic keratosis: Secondary | ICD-10-CM | POA: Diagnosis not present

## 2014-11-22 ENCOUNTER — Other Ambulatory Visit (INDEPENDENT_AMBULATORY_CARE_PROVIDER_SITE_OTHER): Payer: Self-pay | Admitting: Internal Medicine

## 2014-11-22 DIAGNOSIS — K219 Gastro-esophageal reflux disease without esophagitis: Secondary | ICD-10-CM

## 2014-11-22 MED ORDER — PANTOPRAZOLE SODIUM 40 MG PO TBEC: 40.0000 mg | DELAYED_RELEASE_TABLET | Freq: Every day | ORAL | Status: DC

## 2014-11-22 NOTE — Telephone Encounter (Signed)
Rx eprescribed (Protonix)

## 2014-11-23 DIAGNOSIS — Z6831 Body mass index (BMI) 31.0-31.9, adult: Secondary | ICD-10-CM | POA: Diagnosis not present

## 2014-11-23 DIAGNOSIS — J309 Allergic rhinitis, unspecified: Secondary | ICD-10-CM | POA: Diagnosis not present

## 2014-11-28 ENCOUNTER — Encounter (INDEPENDENT_AMBULATORY_CARE_PROVIDER_SITE_OTHER): Payer: Self-pay | Admitting: Internal Medicine

## 2014-11-28 ENCOUNTER — Ambulatory Visit (INDEPENDENT_AMBULATORY_CARE_PROVIDER_SITE_OTHER): Payer: Medicare Other | Admitting: Internal Medicine

## 2014-11-28 ENCOUNTER — Encounter (HOSPITAL_COMMUNITY): Payer: Medicare Other | Attending: Hematology and Oncology

## 2014-11-28 VITALS — BP 112/68 | HR 72 | Temp 98.0°F | Ht 71.0 in | Wt 228.1 lb

## 2014-11-28 DIAGNOSIS — K219 Gastro-esophageal reflux disease without esophagitis: Secondary | ICD-10-CM | POA: Diagnosis not present

## 2014-11-28 DIAGNOSIS — C8310 Mantle cell lymphoma, unspecified site: Secondary | ICD-10-CM | POA: Insufficient documentation

## 2014-11-28 DIAGNOSIS — E538 Deficiency of other specified B group vitamins: Secondary | ICD-10-CM | POA: Diagnosis not present

## 2014-11-28 DIAGNOSIS — R1030 Lower abdominal pain, unspecified: Secondary | ICD-10-CM | POA: Diagnosis not present

## 2014-11-28 DIAGNOSIS — R109 Unspecified abdominal pain: Secondary | ICD-10-CM | POA: Diagnosis not present

## 2014-11-28 LAB — CBC WITH DIFFERENTIAL/PLATELET
BASOS PCT: 0 % (ref 0–1)
Basophils Absolute: 0 10*3/uL (ref 0.0–0.1)
Eosinophils Absolute: 0 10*3/uL (ref 0.0–0.7)
Eosinophils Relative: 1 % (ref 0–5)
HCT: 38.6 % — ABNORMAL LOW (ref 39.0–52.0)
Hemoglobin: 12.6 g/dL — ABNORMAL LOW (ref 13.0–17.0)
Lymphocytes Relative: 18 % (ref 12–46)
Lymphs Abs: 0.9 10*3/uL (ref 0.7–4.0)
MCH: 31.3 pg (ref 26.0–34.0)
MCHC: 32.6 g/dL (ref 30.0–36.0)
MCV: 95.8 fL (ref 78.0–100.0)
MONO ABS: 0.3 10*3/uL (ref 0.1–1.0)
MPV: 10.5 fL (ref 8.6–12.4)
Monocytes Relative: 7 % (ref 3–12)
Neutro Abs: 3.6 10*3/uL (ref 1.7–7.7)
Neutrophils Relative %: 74 % (ref 43–77)
Platelets: 99 10*3/uL — ABNORMAL LOW (ref 150–400)
RBC: 4.03 MIL/uL — ABNORMAL LOW (ref 4.22–5.81)
RDW: 15.6 % — AB (ref 11.5–15.5)
WBC: 4.8 10*3/uL (ref 4.0–10.5)

## 2014-11-28 LAB — COMPREHENSIVE METABOLIC PANEL
ALBUMIN: 4.3 g/dL (ref 3.5–5.2)
ALT: 24 U/L (ref 0–53)
AST: 20 U/L (ref 0–37)
Alkaline Phosphatase: 45 U/L (ref 39–117)
BUN: 26 mg/dL — AB (ref 6–23)
CALCIUM: 8.8 mg/dL (ref 8.4–10.5)
CHLORIDE: 101 meq/L (ref 96–112)
CO2: 30 mEq/L (ref 19–32)
Creat: 1.17 mg/dL (ref 0.50–1.35)
Glucose, Bld: 120 mg/dL — ABNORMAL HIGH (ref 70–99)
POTASSIUM: 3.9 meq/L (ref 3.5–5.3)
SODIUM: 139 meq/L (ref 135–145)
Total Bilirubin: 0.5 mg/dL (ref 0.2–1.2)
Total Protein: 6.4 g/dL (ref 6.0–8.3)

## 2014-11-28 LAB — C-REACTIVE PROTEIN

## 2014-11-28 MED ORDER — CYANOCOBALAMIN 1000 MCG/ML IJ SOLN
1000.0000 ug | Freq: Once | INTRAMUSCULAR | Status: AC
  Administered 2014-11-28: 1000 ug via INTRAMUSCULAR

## 2014-11-28 MED ORDER — CYANOCOBALAMIN 1000 MCG/ML IJ SOLN
INTRAMUSCULAR | Status: AC
  Filled 2014-11-28: qty 1

## 2014-11-28 MED ORDER — PANTOPRAZOLE SODIUM 40 MG PO TBEC: 40.0000 mg | DELAYED_RELEASE_TABLET | Freq: Every day | ORAL | Status: DC

## 2014-11-28 NOTE — Patient Instructions (Addendum)
Cbc and CMET, and CRP.

## 2014-11-28 NOTE — Patient Instructions (Signed)
[place] at [facility] Discharge Instructions  RECOMMENDATIONS MADE BY THE CONSULTANT AND ANY TEST RESULTS WILL BE SENT TO YOUR REFERRING PHYSICIAN.  Vitamin B12 injection given today as ordered. Return as scheduled.  Thank you for choosing [place] at [facility] to provide your oncology and hematology care.  To afford each patient quality time with our provider, please arrive at least 15 minutes before your scheduled appointment time.    You need to re-schedule your appointment should you arrive 10 or more minutes late.  We strive to give you quality time with our providers, and arriving late affects you and other patients whose appointments are after yours.  Also, if you no show three or more times for appointments you may be dismissed from the clinic at the providers discretion.     Again, thank you for choosing [facility].  Our hope is that these requests will decrease the amount of time that you wait before being seen by our physicians.       _____________________________________________________________  Should you have questions after your visit to [facility], please contact our office at [phone] between the hours of 8:30 a.m. and 4:30 p.m.  Voicemails left after 4:30 p.m. will not be returned until the following business day.  For prescription refill requests, have your pharmacy contact our office.

## 2014-11-28 NOTE — Progress Notes (Signed)
Subjective:    Patient ID: [name], male    DOB: 1935/06/19, 80 y.o.   MRN: [id number]  HPIPresents today with c/o lower abdominal pain. Patient states this is the same pain he always has.  He says he is dizzy and swimmy headed.  He says he is weak in is arms and legs.  Symptoms x 2 months.  He thinks the Escitalopram caused his symptoms. Has been off x 3 weeks. He says he feels somewhat better. Has been on Escitalopram x 1 yr. BMs are normal. Usually as a BM x 2 a day.  While on the Escitalopram he was incontinent.  Family hx of colon cancer in a brother in his early 75s.  Hx of mantle cell lymphoma and takes chemo once every 3 months.(Rituxin).  No melena or BRRB.  Admitted in April of 2015 with rectal bleeding and abdominal pain. Hemoglobin remained stable and was discharged the next day.    12/31/2013 CT abdomen/pelvis; abdominal pain, rectal bleeding.  IMPRESSION: 1. No acute findings or explanation for left lower quadrant abdominal pain identified. 2. Sigmoid colon diverticulosis without evidence of acute inflammation. 3. Bladder prolapse status post prostatectomy. 4. Stable cholelithiasis, atherosclerosis, renal cysts and pancreatic atrophy.   02/28/2014 Colonoscopy:  Indications: Patient is 55 old Caucasian male who was diagnosed with mantle cell lymphoma of colon in December 2014. He underwent colonoscopy in March 2015 0.4 cycles of chemotherapy and still had some residual disease. He has received 2 more cycles of chemotherapy and not returning for evaluation. He denies abdominal pain rectal bleeding or diarrhea. He has history of ulcerative colitis and has remained in remission. Impression:  Examination performed to cecum. No mucosal nodularity or other abnormalities noted. Multiple biopsies taken from proximal and distal colon and submitted separately. Mild sigmoid colon diverticulosis. Small external hemorrhoids and anal papilla.  Procedure Date: 12/17/2013  Colonoscopy Indications: Patient is 80 year old Caucasian male with history of ulcerative colitis and underwent colonoscopy in December 2014 clozapine positive stool. He was found to have mantle cell lymphoma of the colon. He has received chemotherapy and is here for restaging of his disease. He has no symptoms other than hypogastric pain. Impression:  Examination performed to cecum. Normal mucosa throughout; previously identified abnormality i.e. nodularity has resolved.. Random biopsies taken from ascending and transverse colon as well as from the descending colon(two containers). Mild sigmoid colon diverticulosis. Small external hemorrhoids and single anal papilla. Biopsy results reviewed with patient and his wife. He still has minimal residual changes of mantle cell lymphoma. He is scheduled to undergo 2 more cycles of chemotherapy. Will forward report to Dr. Barnet Glasgow.   Procedure Date: 08/27/2013 EGD, ED & Colonoscopy Indications: Patient is 80 year old Caucasian male who has history of GERD presents with dysphagia primarily to solids. He also has recurrent hematochezia lower abdominal pain. He also gives history of single episode of melena earlier this week. He has history of ulcerative colitis but currently on no medications. Down the street is positive for colon carcinoma in his brother who was 62 at the time of diagnosis. Patient's last colonoscopy was in June  Impression:  EGD findings; Single small ulcer at distal esophagus along with erythema and edema to mucosa at GE junction. These changes are secondary to reflux esophagitis. Erosive antral gastritis. Esophagus dilated by passing 56 French Maloney dilator but no mucosal destruction induced. 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 Biopsy taken. Segmental  colitis involving mid sigmoid colon. Biopsy taken. Mild sigmoid colon  diverticulosis. External hemorrhoids and single anal papilla.  Biopsy from fine nodules involving two thirds of colon revealed mantle cell lymphoma. Biopsy from sigmoid colon is negative. Will request oncology consultation  CBC    Component Value Date/Time   WBC 5.1 10/31/2014 0900   RBC 3.39* 10/31/2014 0900   RBC 3.55* 08/02/2014 0825   HGB 10.7* 10/31/2014 0900   HCT 31.9* 10/31/2014 0900   PLT 95* 10/31/2014 0900   MCV 94.1 10/31/2014 0900   MCH 31.6 10/31/2014 0900   MCHC 33.5 10/31/2014 0900   RDW 15.3 10/31/2014 0900   LYMPHSABS 0.5* 10/31/2014 0900   MONOABS 0.3 10/31/2014 0900   EOSABS 0.1 10/31/2014 0900   BASOSABS 0.0 10/31/2014 0900    CMP Latest Ref Rng 10/31/2014 08/02/2014 05/10/2014  Glucose 70 - 99 mg/dL 215(H) 143(H) 145(H)  BUN 6 - 23 mg/dL 32(H) 29(H) 27(H)  Creatinine 0.50 - 1.35 mg/dL 1.14 1.35 1.32  Sodium 135 - 145 mmol/L 140 143 144  Potassium 3.5 - 5.1 mmol/L 4.4 4.1 4.1  Chloride 96 - 112 mmol/L 109 104 106  CO2 19 - 32 mmol/L 25 26 26   Calcium 8.4 - 10.5 mg/dL 8.6 9.2 8.9  Total Protein 6.0 - 8.3 g/dL 6.4 6.6 6.5  Total Bilirubin 0.3 - 1.2 mg/dL 0.6 0.5 0.7  Alkaline Phos 39 - 117 U/L 38(L) 56 44  AST 0 - 37 U/L 22 14 17   ALT 0 - 53 U/L 17 12 16       Review of Systems Married. Two children.  One has problems ? Brain problems.  One in good health.    Objective:   Physical ExamBlood pressure 112/68, pulse 72, temperature 98 F (36.7 C), height 5\' 11"  (1.803 m), weight 228 lb 1.6 oz (103.465 kg). Alert and oriented. Skin warm and dry. Oral mucosa is moist.   . Sclera anicteric, conjunctivae is pink. Thyroid not enlarged. No cervical lymphadenopathy. Bilateral wheezes. Heart regular rate and rhythm.  Abdomen is soft. Bowel sounds are positive. No hepatomegaly. No abdominal masses felt. No tenderness.  No edema to lower extremities.          Assessment & Plan:  Chronic abdominal pain. Hx of mantle cell lymphoma. Presently taking Rituxin  every 3 months  CBC, CMET, CRP. Further recommendations to follow . I discussed with Dr Laural Golden.

## 2014-11-28 NOTE — Progress Notes (Signed)
[name] presents today for injection per MD orders. B12 1000 mcg administered SQ in right Upper Arm. Administration without incident. Patient tolerated well.

## 2014-12-01 ENCOUNTER — Ambulatory Visit (HOSPITAL_COMMUNITY): Payer: Medicare Other

## 2014-12-01 DIAGNOSIS — E782 Mixed hyperlipidemia: Secondary | ICD-10-CM | POA: Diagnosis not present

## 2014-12-01 DIAGNOSIS — I10 Essential (primary) hypertension: Secondary | ICD-10-CM | POA: Diagnosis not present

## 2014-12-01 DIAGNOSIS — D696 Thrombocytopenia, unspecified: Secondary | ICD-10-CM | POA: Diagnosis not present

## 2014-12-01 DIAGNOSIS — E119 Type 2 diabetes mellitus without complications: Secondary | ICD-10-CM | POA: Diagnosis not present

## 2014-12-02 ENCOUNTER — Ambulatory Visit (HOSPITAL_COMMUNITY): Payer: Medicare Other

## 2014-12-08 ENCOUNTER — Encounter (INDEPENDENT_AMBULATORY_CARE_PROVIDER_SITE_OTHER): Payer: Self-pay | Admitting: *Deleted

## 2014-12-12 ENCOUNTER — Telehealth (HOSPITAL_COMMUNITY): Payer: Self-pay | Admitting: Hematology & Oncology

## 2014-12-12 ENCOUNTER — Encounter (HOSPITAL_BASED_OUTPATIENT_CLINIC_OR_DEPARTMENT_OTHER): Payer: Medicare Other

## 2014-12-12 VITALS — BP 142/64 | HR 84 | Temp 97.4°F | Resp 20

## 2014-12-12 DIAGNOSIS — Z452 Encounter for adjustment and management of vascular access device: Secondary | ICD-10-CM | POA: Diagnosis not present

## 2014-12-12 DIAGNOSIS — C8339 Diffuse large B-cell lymphoma, extranodal and solid organ sites: Secondary | ICD-10-CM | POA: Diagnosis not present

## 2014-12-12 DIAGNOSIS — C8310 Mantle cell lymphoma, unspecified site: Secondary | ICD-10-CM

## 2014-12-12 MED ORDER — HEPARIN SOD (PORK) LOCK FLUSH 100 UNIT/ML IV SOLN
500.0000 [IU] | Freq: Once | INTRAVENOUS | Status: AC
  Administered 2014-12-12: 500 [IU] via INTRAVENOUS

## 2014-12-12 MED ORDER — HEPARIN SOD (PORK) LOCK FLUSH 100 UNIT/ML IV SOLN
INTRAVENOUS | Status: AC
  Filled 2014-12-12: qty 5

## 2014-12-12 MED ORDER — SODIUM CHLORIDE 0.9 % IJ SOLN
10.0000 mL | Freq: Once | INTRAMUSCULAR | Status: AC
  Administered 2014-12-12: 10 mL via INTRAVENOUS

## 2014-12-12 NOTE — Telephone Encounter (Signed)
Pt questioned a 5 bal on a bill for [facility] 09/06/14. Sent email to [employer] 

## 2014-12-12 NOTE — Progress Notes (Signed)
[name] presented for Portacath access and flush. Proper placement of portacath confirmed by CXR. Portacath located RT chest wall accessed with  H 20 needle. Good blood return present. Portacath flushed with 72ml NS and 500U/17ml Heparin and needle removed intact. Procedure without incident. Patient tolerated procedure well.

## 2014-12-26 ENCOUNTER — Encounter (HOSPITAL_COMMUNITY): Payer: Medicare Other | Attending: Hematology & Oncology

## 2014-12-26 ENCOUNTER — Encounter (HOSPITAL_COMMUNITY): Payer: Self-pay

## 2014-12-26 DIAGNOSIS — E538 Deficiency of other specified B group vitamins: Secondary | ICD-10-CM | POA: Diagnosis present

## 2014-12-26 DIAGNOSIS — C8310 Mantle cell lymphoma, unspecified site: Secondary | ICD-10-CM

## 2014-12-26 MED ORDER — CYANOCOBALAMIN 1000 MCG/ML IJ SOLN
1000.0000 ug | Freq: Once | INTRAMUSCULAR | Status: AC
  Administered 2014-12-26: 1000 ug via INTRAMUSCULAR

## 2014-12-26 MED ORDER — CYANOCOBALAMIN 1000 MCG/ML IJ SOLN
INTRAMUSCULAR | Status: AC
  Filled 2014-12-26: qty 1

## 2014-12-26 NOTE — Progress Notes (Signed)
..  [name] presents today for injection per the provider's orders.  Vitamin b12 administration without incident; see MAR for injection details.  Patient tolerated procedure well and without incident.  No questions or complaints noted at this time.

## 2015-01-02 DIAGNOSIS — I10 Essential (primary) hypertension: Secondary | ICD-10-CM | POA: Diagnosis not present

## 2015-01-02 DIAGNOSIS — E782 Mixed hyperlipidemia: Secondary | ICD-10-CM | POA: Diagnosis not present

## 2015-01-02 DIAGNOSIS — E119 Type 2 diabetes mellitus without complications: Secondary | ICD-10-CM | POA: Diagnosis not present

## 2015-01-06 DIAGNOSIS — E1142 Type 2 diabetes mellitus with diabetic polyneuropathy: Secondary | ICD-10-CM | POA: Diagnosis not present

## 2015-01-06 DIAGNOSIS — B351 Tinea unguium: Secondary | ICD-10-CM | POA: Diagnosis not present

## 2015-01-09 DIAGNOSIS — I10 Essential (primary) hypertension: Secondary | ICD-10-CM | POA: Diagnosis not present

## 2015-01-09 DIAGNOSIS — D509 Iron deficiency anemia, unspecified: Secondary | ICD-10-CM | POA: Diagnosis not present

## 2015-01-09 DIAGNOSIS — E782 Mixed hyperlipidemia: Secondary | ICD-10-CM | POA: Diagnosis not present

## 2015-01-09 DIAGNOSIS — D696 Thrombocytopenia, unspecified: Secondary | ICD-10-CM | POA: Diagnosis not present

## 2015-01-23 ENCOUNTER — Encounter (HOSPITAL_COMMUNITY): Payer: Medicare Other

## 2015-01-30 ENCOUNTER — Encounter (HOSPITAL_COMMUNITY): Payer: Self-pay | Admitting: Hematology & Oncology

## 2015-01-30 ENCOUNTER — Encounter (HOSPITAL_COMMUNITY): Payer: Medicare Other

## 2015-01-30 ENCOUNTER — Encounter (HOSPITAL_COMMUNITY): Payer: Self-pay | Admitting: Lab

## 2015-01-30 ENCOUNTER — Encounter (HOSPITAL_COMMUNITY): Payer: Medicare Other | Attending: Hematology and Oncology

## 2015-01-30 ENCOUNTER — Encounter (HOSPITAL_BASED_OUTPATIENT_CLINIC_OR_DEPARTMENT_OTHER): Payer: Medicare Other | Admitting: Hematology & Oncology

## 2015-01-30 VITALS — BP 128/77 | HR 73 | Temp 97.4°F | Resp 20 | Wt 229.6 lb

## 2015-01-30 VITALS — BP 109/63 | HR 65 | Temp 97.8°F | Resp 16

## 2015-01-30 DIAGNOSIS — C8310 Mantle cell lymphoma, unspecified site: Secondary | ICD-10-CM | POA: Diagnosis not present

## 2015-01-30 DIAGNOSIS — R251 Tremor, unspecified: Secondary | ICD-10-CM | POA: Diagnosis not present

## 2015-01-30 DIAGNOSIS — C8319 Mantle cell lymphoma, extranodal and solid organ sites: Secondary | ICD-10-CM | POA: Diagnosis not present

## 2015-01-30 DIAGNOSIS — D61818 Other pancytopenia: Secondary | ICD-10-CM

## 2015-01-30 DIAGNOSIS — Z5112 Encounter for antineoplastic immunotherapy: Secondary | ICD-10-CM | POA: Diagnosis not present

## 2015-01-30 DIAGNOSIS — R5382 Chronic fatigue, unspecified: Secondary | ICD-10-CM

## 2015-01-30 LAB — CBC WITH DIFFERENTIAL/PLATELET
BASOS ABS: 0 10*3/uL (ref 0.0–0.1)
Basophils Relative: 0 % (ref 0–1)
EOS PCT: 2 % (ref 0–5)
Eosinophils Absolute: 0.1 10*3/uL (ref 0.0–0.7)
HCT: 33.7 % — ABNORMAL LOW (ref 39.0–52.0)
Hemoglobin: 11.2 g/dL — ABNORMAL LOW (ref 13.0–17.0)
LYMPHS ABS: 0.6 10*3/uL — AB (ref 0.7–4.0)
LYMPHS PCT: 19 % (ref 12–46)
MCH: 31.8 pg (ref 26.0–34.0)
MCHC: 33.2 g/dL (ref 30.0–36.0)
MCV: 95.7 fL (ref 78.0–100.0)
Monocytes Absolute: 0.2 10*3/uL (ref 0.1–1.0)
Monocytes Relative: 7 % (ref 3–12)
NEUTROS ABS: 2.4 10*3/uL (ref 1.7–7.7)
Neutrophils Relative %: 71 % (ref 43–77)
Platelets: 108 10*3/uL — ABNORMAL LOW (ref 150–400)
RBC: 3.52 MIL/uL — ABNORMAL LOW (ref 4.22–5.81)
RDW: 15.3 % (ref 11.5–15.5)
SMEAR REVIEW: DECREASED
WBC: 3.3 10*3/uL — ABNORMAL LOW (ref 4.0–10.5)

## 2015-01-30 LAB — COMPREHENSIVE METABOLIC PANEL
ALT: 15 U/L — AB (ref 17–63)
AST: 19 U/L (ref 15–41)
Albumin: 4 g/dL (ref 3.5–5.0)
Alkaline Phosphatase: 49 U/L (ref 38–126)
Anion gap: 8 (ref 5–15)
BUN: 25 mg/dL — AB (ref 6–20)
CHLORIDE: 111 mmol/L (ref 101–111)
CO2: 26 mmol/L (ref 22–32)
CREATININE: 1.16 mg/dL (ref 0.61–1.24)
Calcium: 9 mg/dL (ref 8.9–10.3)
GFR calc Af Amer: >60 mL/min (ref >60)
GFR calc non Af Amer: 59 mL/min — ABNORMAL LOW (ref >60)
GLUCOSE: 110 mg/dL — AB (ref 70–99)
Potassium: 4.1 mmol/L (ref 3.5–5.1)
Sodium: 145 mmol/L (ref 135–145)
Total Bilirubin: 0.6 mg/dL (ref 0.3–1.2)
Total Protein: 6.5 g/dL (ref 6.5–8.1)

## 2015-01-30 LAB — LACTATE DEHYDROGENASE: LDH: 127 U/L (ref 98–192)

## 2015-01-30 MED ORDER — HEPARIN SOD (PORK) LOCK FLUSH 100 UNIT/ML IV SOLN
500.0000 [IU] | Freq: Once | INTRAVENOUS | Status: AC | PRN
  Administered 2015-01-30: 500 [IU]
  Filled 2015-01-30: qty 5

## 2015-01-30 MED ORDER — CYANOCOBALAMIN 1000 MCG/ML IJ SOLN
1000.0000 ug | Freq: Once | INTRAMUSCULAR | Status: AC
  Administered 2015-01-30: 1000 ug via INTRAMUSCULAR
  Filled 2015-01-30: qty 1

## 2015-01-30 MED ORDER — SODIUM CHLORIDE 0.9 % IJ SOLN
10.0000 mL | INTRAMUSCULAR | Status: DC | PRN
  Administered 2015-01-30: 10 mL
  Filled 2015-01-30: qty 10

## 2015-01-30 MED ORDER — RITUXIMAB CHEMO INJECTION 500 MG/50ML
500.0000 mg/m2 | Freq: Once | INTRAVENOUS | Status: AC
  Administered 2015-01-30: 1100 mg via INTRAVENOUS
  Filled 2015-01-30: qty 100

## 2015-01-30 MED ORDER — SODIUM CHLORIDE 0.9 % IV SOLN
Freq: Once | INTRAVENOUS | Status: AC
  Administered 2015-01-30: 10:00:00 via INTRAVENOUS

## 2015-01-30 NOTE — Patient Instructions (Addendum)
[place] at [facility]  Discharge Instructions:  Exam completed by Dr Whitney Muse. Rituxan and b12 injection today B12 monthly Rituxan every 3 months Follow up with the doctor in 3 months Referral sent to Dr Merlene Laughter for your familiar tremors. Please call the clinic if you have any questions or concerns  _______________________________________________________________  Thank you for choosing [place] at [facility] to provide your oncology and hematology care.  To afford each patient quality time with our providers, please arrive at least 15 minutes before your scheduled appointment.  You need to re-schedule your appointment if you arrive 10 or more minutes late.  We strive to give you quality time with our providers, and arriving late affects you and other patients whose appointments are after yours.  Also, if you no show three or more times for appointments you may be dismissed from the clinic.  Again, thank you for choosing [place] at [place] hope is that these requests will allow you access to exceptional care and in a timely manner. _______________________________________________________________  If you have questions after your visit, please contact our office at [phone] between the hours of 8:30 a.m. and 5:00 p.m. Voicemails left after 4:30 p.m. will not be returned until the following business day. _______________________________________________________________  For prescription refill requests, have your pharmacy contact our office. _______________________________________________________________  Recommendations made by the consultant and any test results will be sent to your referring physician. _______________________________________________________________

## 2015-01-30 NOTE — Progress Notes (Signed)
STAR Program Physical Impairment and Functional Assessment Screening Tool  1. Are you having any pain, including headaches, joint pain, or muscle pain (upper body = OT; lower body = PT)?  Yes, this is new since my last visit.  2. Do your hands and/or feet feel numb or tingle (PT)?  Yes, this is new since my last visit.  3. Does any part of your body feel swollen or larger than usual (upper body = OT; lower body = PT)?  Yes, this is new since my last visit.  4. Are you so tired that you cannot do the things you want or need to do (PT or OT)?  Yes, this is new since my last visit.  5. Are you feeling weak or are you having trouble moving any part of your body (PT/OT)?  Yes, this is new since my last visit.  6. Are you having trouble concentrating, thinking, or remembering things (OT/ST)?  Yes, this is new since my last visit.  7. Are you having trouble moving around or feel like you might trip or fall (PT)?  Yes, this is new since my last visit.  8. Are you having trouble swallowing (ST)?  Yes, this started after my diagnosis and is still a problem.  9. Are you having trouble speaking (ST)?  Yes, this started after my diagnosis and is still a problem.  10. Are you having trouble with going or getting to the bathroom (OT)?  Yes, this started after my diagnosis and is still a problem.  11. Are you having trouble with your sexual function (OT)?  Yes, this is new since my last visit.  12. Are you having trouble lifting things, even just your arms (OT/PT)?  Yes, this is new since my last visit.  28. Are you having trouble taking care of yourself as in dressing or bathing (OT)?  No  14. Are you having trouble with daily tasks like chores or shopping (OT)?  Yes, this is new since my last visit.  15. Are you having trouble driving (OT)?  No  16. Are you having trouble returning to work or completing your tasks at work (OT)?  No  Other concerns:  Lower back  pain  Legend: OT = Occupational Therapy PT = Physical Therapy ST = Speech Therapy

## 2015-01-30 NOTE — Progress Notes (Signed)
Brandon Cahill, MD  [place] [address]    DIAGNOSIS: Mantel Cell lymphoma of the large intestine diagnosed 08/2013 Colonoscopy on 02/28/2014 no evidence of disease   SUMMARY OF ONCOLOGIC HISTORY:   Mantle cell lymphoma   08/27/2013 Initial Diagnosis Colon, biopsy, random - MANTLE CELL LYMPHOMA   09/06/2013 Imaging CT CAP- No significant lymphadenopathy identified within the chest, abdomen or pelvis.   09/14/2013 - 02/07/2014 Chemotherapy Bendamustine/Rituxan x 6 cycles with Neulasta support   05/10/2014 -  Chemotherapy maintenance Rituxan 500 mg/m cycle 1    CURRENT THERAPY: Rituxan   INTERVAL HISTORY: [name] 80 y.o. male returns for follow-up of his mantle cell lymphoma.  [name] is doing well. [name] has gained back all of his weight and because of his diabetes that frustrates him some.  [name] denies abdominal pain, nausea, night sweats, or fever.  [name] has been experiencing weakness. This is chronic but problematic.[name] has also been experiencing tremors where his hands and arms shake to the point that [name] has "trouble turning pages and writing". [name] currently does not have a neurologist. Tremor runs in the family. Says [name] had a grandmother who's head shook all the time and a sister who had tremors, as well.  Has a chronic cough, had bronchitis a couple months ago. [name]  MEDICAL HISTORY: Past Medical History  Diagnosis Date  . Type 2 diabetes mellitus   . 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 Mixed hyperlipidemia   . PVD (peripheral vascular disease)   . Essential hypertension, benign   . Prostate cancer   . Colon cancer   . B12 deficiency 02/07/2014  . Obstructive sleep apnea     does not use  . GERD (gastroesophageal reflux disease)     has Mixed hyperlipidemia; HYPERTENSION, BENIGN; Coronary atherosclerosis of native coronary artery; VASOMOTOR RHINITIS; GERD;  OSA (obstructive sleep apnea); H/O ulcerative colitis; Mantle cell lymphoma; Prostate cancer; Proctitis, radiation; Abdominal pain, left lower quadrant; Abdominal pain, unspecified site; Lower GI bleed; Diverticulitis; Leukopenia; Diverticulosis of colon with hemorrhage; Thrombocytopenia, unspecified; Acute blood loss anemia; and B12 deficiency on his problem list.     is allergic to bee venom; adhesive; ciprofloxacin; codeine; iodine; latex; povidone-iodine; and simvastatin.  [name] does not currently have medications on file.  SURGICAL HISTORY: Past Surgical History  Procedure Laterality Date  . Tonsillectomy    . Back surgery    . Vasectomy    . Knee surgery Right     total knee  . Shoulder surgery    . Prostatectomy    . Colonoscopy with esophagogastroduodenoscopy (egd) N/A 08/27/2013    Procedure: COLONOSCOPY WITH ESOPHAGOGASTRODUODENOSCOPY (EGD);  Surgeon: Rogene Houston, MD;  [facility];  Service: Endoscopy;  Laterality: N/A;  730  . Balloon dilation N/A 08/27/2013    Procedure: BALLOON DILATION;  Surgeon: Rogene Houston, MD;  [facility];  Service: Endoscopy;  Laterality: N/A;  Venia Minks dilation N/A 08/27/2013    Procedure: Venia Minks DILATION;  Surgeon: Rogene Houston, MD;  [facility];  Service: Endoscopy;  Laterality: N/A;  . Savory dilation N/A 08/27/2013    Procedure: SAVORY DILATION;  Surgeon: Rogene Houston, MD;  [facility];  Service: Endoscopy;  Laterality: N/A;  . Portacath placement Right 2014  . Colonoscopy N/A 12/17/2013    Procedure:  COLONOSCOPY;  Surgeon: Rogene Houston, MD;  [facility];  Service: Endoscopy;  Laterality: N/A;  940  . Colon surgery    . Knee arthroscopy Left   . Removal of port Right   . Colonoscopy N/A 02/28/2014    Procedure: COLONOSCOPY;  Surgeon: Rogene Houston, MD;  [facility];  Service: Endoscopy;  Laterality: N/A;  730    SOCIAL HISTORY: History   Social  History  . Marital Status: Married    Spouse Name: N/A  . Number of Children: N/A  . Years of Education: N/A   Occupational History  . retired    Social History Main Topics  . Smoking status: Never Smoker   . Smokeless tobacco: Never Used  . Alcohol Use: No  . Drug Use: No  . Sexual Activity: Yes    Birth Control/ Protection: None   Other Topics Concern  . Not on file   Social History Narrative    FAMILY HISTORY: Family History  Problem Relation Age of Onset  . Colon cancer Brother   . Cancer Brother   . Diabetes Father   . Cancer Brother     Review of Systems  Constitutional: Positive for malaise/fatigue.  HENT: Negative.   Eyes: Negative.   Respiratory: Negative.       Chronic cough. Cardiovascular: Negative.   Gastrointestinal: Negative.   Genitourinary: Negative.   Musculoskeletal: Negative.   Skin: Negative.   Neurological: Positive for weakness. Negative for dizziness, tingling, tremors, sensory change, speech change, focal weakness, seizures and loss of consciousness.      Tremor in hands and arms. Endo/Heme/Allergies: Negative.   Psychiatric/Behavioral: Negative.     PHYSICAL EXAMINATION  ECOG PERFORMANCE STATUS: 1 - Symptomatic but completely ambulatory  Filed Vitals:   01/30/15 0845  BP: 128/77  Pulse: 73  Temp: 97.4 F (36.3 C)  Resp: 20    Physical Exam  Constitutional: [name] is oriented to person, place, and time and well-developed, well-nourished, and in no distress.  HENT:  Head: Normocephalic and atraumatic.  Nose: Nose normal.  Mouth/Throat: Oropharynx is clear and moist. No oropharyngeal exudate.  Eyes: Conjunctivae and EOM are normal. Pupils are equal, round, and reactive to light. Right eye exhibits no discharge. Left eye exhibits no discharge. No scleral icterus.  Neck: Normal range of motion. Neck supple. No tracheal deviation present. No thyromegaly present.  Cardiovascular: Normal rate, regular rhythm and normal heart sounds.   Exam reveals no gallop and no friction rub.   No murmur heard. Pulmonary/Chest: Effort normal and breath sounds normal. [name] has no wheezes. [name] has no rales.  Abdominal: Soft. Bowel sounds are normal. [name] exhibits no distension and no mass. There is no tenderness. There is no rebound and no guarding.  Musculoskeletal: Normal range of motion. [name] exhibits no edema.  Lymphadenopathy:       Head (right side): No submental and no submandibular adenopathy present.       Head (left side): No submental and no submandibular adenopathy present.    [name] has no cervical adenopathy.       Right axillary: No pectoral and no lateral adenopathy present.       Left axillary: No pectoral and no lateral adenopathy present.      Right: No inguinal and no supraclavicular adenopathy present.       Left: No inguinal and no supraclavicular adenopathy present.  Neurological: [name] is alert and oriented to person, place, and time. [name] has normal reflexes.  No cranial nerve deficit. Gait normal. Coordination normal.  Skin: Skin is warm and dry. No rash noted.  Psychiatric: Mood, memory, affect and judgment normal.  Nursing note and vitals reviewed.   LABORATORY DATA:  CBC    Component Value Date/Time   WBC 3.3* 01/30/2015 0855   RBC 3.52* 01/30/2015 0855   RBC 3.55* 08/02/2014 0825   HGB 11.2* 01/30/2015 0855   HCT 33.7* 01/30/2015 0855   PLT 108* 01/30/2015 0855   MCV 95.7 01/30/2015 0855   MCH 31.8 01/30/2015 0855   MCHC 33.2 01/30/2015 0855   RDW 15.3 01/30/2015 0855   LYMPHSABS 0.6* 01/30/2015 0855   MONOABS 0.2 01/30/2015 0855   EOSABS 0.1 01/30/2015 0855   BASOSABS 0.0 01/30/2015 0855   CMP     Component Value Date/Time   NA 145 01/30/2015 0855   K 4.1 01/30/2015 0855   CL 111 01/30/2015 0855   CO2 26 01/30/2015 0855   GLUCOSE 110* 01/30/2015 0855   BUN 25* 01/30/2015 0855   CREATININE 1.16 01/30/2015 0855   CREATININE 1.17 11/28/2014 0955   CALCIUM 9.0 01/30/2015 0855   PROT 6.5 01/30/2015 0855     ALBUMIN 4.0 01/30/2015 0855   AST 19 01/30/2015 0855   ALT 15* 01/30/2015 0855   ALKPHOS 49 01/30/2015 0855   BILITOT 0.6 01/30/2015 0855   GFRNONAA 59* 01/30/2015 0855   GFRAA >60 01/30/2015 0855    ASSESSMENT and THERAPY PLAN:  Mantle cell lymphoma Chronic fatigue Tremor Pancytopenia  [name] will continue with maintenance Rituxan. His appetite is good and [name] denies any night sweats, abdominal pain, or change in appetite. I have discussed with him referral into the [facility]. I advised him that my recommendations in regards to his fatigue are to increase his physical activity. [name] may benefit from strength training as well.  Refer him to neurology for his tremor. 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  His pancytopenia is chronic and stable. We will continue with observation.  All questions were answered. The patient knows to call the clinic with any problems, questions or concerns. We can certainly see the patient much sooner if necessary.  This document serves as a record of services personally performed by Ancil Linsey, MD. It was created on her behalf by Arlyce Harman, a trained medical scribe. The creation of this record is based on the scribe's personal observations and the provider's statements to them. This document has been checked and approved by the attending provider. This note was electronically signed.  Ancil Linsey, MD

## 2015-01-30 NOTE — Addendum Note (Signed)
Addended by: Elenor Legato on: 01/30/2015 12:03 PM   Modules accepted: Orders

## 2015-01-30 NOTE — Progress Notes (Signed)
Patient reports he took tylenol and benadryl at home today prior to appointment. [name] presents today for injection per MD orders. B12 1000 mcg administered IM in left Upper Arm. Administration without incident. Patient tolerated well. Tolerated chemo well.

## 2015-01-30 NOTE — Patient Instructions (Signed)
[facility] Discharge Instructions for Patients Receiving Chemotherapy  Today you received the following chemotherapy agents Rituxan. Continue Rituxan every 90 days. Continue B12 injections monthly. Return as scheduled.  If you develop nausea and vomiting that is not controlled by your nausea medication, call the clinic. If it is after clinic hours your family physician or the after hours number for the clinic or go to the Emergency Department. BELOW ARE SYMPTOMS THAT SHOULD BE REPORTED IMMEDIATELY:  *FEVER GREATER THAN 101.0 F  *CHILLS WITH OR WITHOUT FEVER  NAUSEA AND VOMITING THAT IS NOT CONTROLLED WITH YOUR NAUSEA MEDICATION  *UNUSUAL SHORTNESS OF BREATH  *UNUSUAL BRUISING OR BLEEDING  TENDERNESS IN MOUTH AND THROAT WITH OR WITHOUT PRESENCE OF ULCERS  *URINARY PROBLEMS  *BOWEL PROBLEMS  UNUSUAL RASH Items with * indicate a potential emergency and should be followed up as soon as possible.  I have been informed and understand all the instructions given to me. I know to contact the clinic, my physician, or go to the Emergency Department if any problems should occur. I do not have any questions at this time, but understand that I may call the clinic during office hours or the Patient Navigator at [phone] should I have any questions or need assistance in obtaining follow up care.    __________________________________________  _____________  __________ Signature of Patient or Authorized Representative            Date                   Time    __________________________________________ Nurse's Signature

## 2015-01-30 NOTE — Progress Notes (Signed)
Referral to Dr Merlene Laughter appt 6/13 at 62. Records faxed on 5/9

## 2015-02-14 ENCOUNTER — Ambulatory Visit (HOSPITAL_COMMUNITY): Payer: Medicare Other | Attending: Hematology & Oncology | Admitting: Physical Therapy

## 2015-02-14 DIAGNOSIS — R531 Weakness: Secondary | ICD-10-CM

## 2015-02-14 DIAGNOSIS — D61818 Other pancytopenia: Secondary | ICD-10-CM | POA: Insufficient documentation

## 2015-02-14 DIAGNOSIS — R52 Pain, unspecified: Secondary | ICD-10-CM | POA: Diagnosis not present

## 2015-02-14 DIAGNOSIS — M6281 Muscle weakness (generalized): Secondary | ICD-10-CM | POA: Insufficient documentation

## 2015-02-14 DIAGNOSIS — T732XXA Exhaustion due to exposure, initial encounter: Secondary | ICD-10-CM

## 2015-02-14 DIAGNOSIS — R5382 Chronic fatigue, unspecified: Secondary | ICD-10-CM | POA: Insufficient documentation

## 2015-02-14 DIAGNOSIS — R251 Tremor, unspecified: Secondary | ICD-10-CM | POA: Insufficient documentation

## 2015-02-14 NOTE — Patient Instructions (Addendum)
Heel Raise (Sitting)   Raise heels, keeping toes on floor. Repeat __15__ times per set. Do 1____ sets per session. Do _2___ sessions per day.  http://orth.exer.us/44   Copyright  VHI. All rights reserved.  Toe Raise (Sitting)   Raise toes, keeping heels on floor. Repeat __15__ times per set. Do _1___ sets per session. Do __2__ sessions per day.  http://orth.exer.us/46   Copyright  VHI. All rights reserved.  ROM: Flexion (Standing)   Bring arms straight out in front and raise as high as possible without pain. Keep palms facing up. Repeat __5-20__ times per set. Do _1___ sets per session. Do 2____ sessions per day. Use 2 # http://orth.exer.us/908   Copyright  VHI. All rights reserved.  ROM: Abduction (Standing)   Bring arms straight out from sides and raise as high as possible without pain. Repeat _5-201___ times per set. Do ____ sets per session. Do ___1-2_ sessions per day. Use 2 # http://orth.exer.us/910   Copyright  VHI. All rights reserved.  ROM: External / Internal Rotation - in Flexion (Standing)   With upper arms at side and forearms  parallel to floor, keep elbows bent at right angles and rotate up then down as far as possible without pain. Repeat 5-15____ times per set. Do __1__ sets per session. Do __2__ sessions per day.  http://orth.exer.us/914   Copyright  VHI. All rights reserved.  Scapular Retraction (Standing)   With arms at sides, pinch shoulder blades together. Repeat __10__ times per set. Do __1__ sets per session. Do _2___ sessions per day.  http://orth.exer.us/944   Copyright  VHI. All rights reserved.  Bridging   Slowly raise buttocks from floor, keeping stomach tight. Repeat 10____ times per set. Do _1___ sets per session. Do _2___ sessions per day.  http://orth.exer.us/1096   Copyright  VHI. All rights reserved.  Heel Raise: Bilateral (Standing)   Rise on balls of feet. Repeat _10___ times per set. Do __1__ sets per  session. Do _2___ sessions per day.  http://orth.exer.us/38   Copyright  VHI. All rights reserved.  Functional Quadriceps: Chair Squat   Keeping feet flat on floor, shoulder width apart, squat as low as is comfortable. Use support as necessary. Repeat ___10_ times per set. Do 1____ sets per session. Do _2___ sessions per day.  http://orth.exer.us/736   Copyright  VHI. All rights reserved.  Backward Bend (Standing)   Arch backward to make hollow of back deeper. Hold __2__ seconds. Repeat _5___ times per set. Do ____ sets per session. Do ____ sessions per day. 13 http://orth.exer.us/178   Copyright  VHI. All rights reserved.

## 2015-02-14 NOTE — Therapy (Signed)
[place], [place], [address] Phone: [phone]   Fax:  [phone]  Physical Therapy Evaluation  Patient Details  Name: [name] MRN: [id number] Date of Birth: 1934/12/02 Referring Provider:  Patrici Ranks, MD  Encounter Date: 02/14/2015      PT End of Session - 02/14/15 1416    Visit Number 1   Number of Visits 8   Date for PT Re-Evaluation 03/16/15   Authorization Type medicare   Authorization - Visit Number 1   Authorization - Number of Visits 10   PT Start Time 1100   PT Stop Time 1200   PT Time Calculation (min) 60 min   Activity Tolerance Patient tolerated treatment well   Behavior During Therapy [facility] for tasks assessed/performed      Past Medical History  Diagnosis Date  . Type 2 diabetes mellitus   . 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 Mixed hyperlipidemia   . PVD (peripheral vascular disease)   . Essential hypertension, benign   . Prostate cancer   . Colon cancer   . B12 deficiency 02/07/2014  . Obstructive sleep apnea     does not use  . GERD (gastroesophageal reflux disease)     Past Surgical History  Procedure Laterality Date  . Tonsillectomy    . Back surgery    . Vasectomy    . Knee surgery Right     total knee  . Shoulder surgery    . Prostatectomy    . Colonoscopy with esophagogastroduodenoscopy (egd) N/A 08/27/2013    Procedure: COLONOSCOPY WITH ESOPHAGOGASTRODUODENOSCOPY (EGD);  Surgeon: Rogene Houston, MD;  [facility];  Service: Endoscopy;  Laterality: N/A;  730  . Balloon dilation N/A 08/27/2013    Procedure: BALLOON DILATION;  Surgeon: Rogene Houston, MD;  [facility];  Service: Endoscopy;  Laterality: N/A;  Venia Minks dilation N/A 08/27/2013    Procedure: Venia Minks DILATION;  Surgeon: Rogene Houston, MD;  [facility];   Service: Endoscopy;  Laterality: N/A;  . Savory dilation N/A 08/27/2013    Procedure: SAVORY DILATION;  Surgeon: Rogene Houston, MD;  [facility];  Service: Endoscopy;  Laterality: N/A;  . Portacath placement Right 2014  . Colonoscopy N/A 12/17/2013    Procedure: COLONOSCOPY;  Surgeon: Rogene Houston, MD;  [facility];  Service: Endoscopy;  Laterality: N/A;  940  . Colon surgery    . Knee arthroscopy Left   . Removal of port Right   . Colonoscopy N/A 02/28/2014    Procedure: COLONOSCOPY;  Surgeon: Rogene Houston, MD;  [facility];  Service: Endoscopy;  Laterality: N/A;  730    There were no vitals filed for this visit.  Visit Diagnosis:  Fatigue due to exposure, initial encounter  Decreased strength  Pain      Subjective Assessment - 02/14/15 1103    Subjective Mr. Fosnaugh states that he is tired all the time and he hurts.  He states his shoulder and arms hurt and his legs cramp.  He states he has the shakes most the time some days are worse than others.    Patient is accompained by: Family member   Pertinent History DX with mantle cell lymphoma December of 2014 with 6 cycles of chemo.     How long can  you sit comfortably? no problem    How long can you stand comfortably? Pt can stand for five minutes at the most then his legs get weak and begin to hurt.   How long can you walk comfortably? Pt does not walk fast or far.  Pt will walk up to 15 minutes at a time and then he will need to take a break.     Patient Stated Goals less pain; more strength.     Currently in Pain? 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    Leisure none   Cognition   Overall Cognitive Status Within Functional Limits for tasks assessed   Observation/Other Assessments   Other Surveys  Other Surveys  FACIT-F 68.6; TUG 9.87; VASpain8; fatigue 9; distress 8.33   Functional Tests   Functional tests Single leg stance;Sit to Stand   Single Leg Stance   Comments Lt 40 ; Rt  33 seconds    Sit to Stand   Comments 4 sit to stand   normal for age group 11-17   Posture/Postural Control   Posture/Postural Control Postural limitations   Postural Limitations Rounded Shoulders;Increased lumbar lordosis;Increased thoracic kyphosis   Posture Comments PT stands/walks with 20 degree forward bend at hips.    AROM   Overall AROM  Within functional limits for tasks performed   AROM Assessment Site Shoulder;Elbow   Right/Left Shoulder Right   Strength   Right Shoulder Flexion 4/5   Right Shoulder ABduction 4/5   Right Shoulder Internal Rotation 3+/5   Right Shoulder External Rotation 4-/5   Left Shoulder Flexion 5/5   Left Shoulder Internal Rotation 3+/5   Left Shoulder External Rotation 4/5   Right/Left Elbow --  WFL   Right/Left Hip --  LE strength generally 4/5    Right Ankle Dorsiflexion 4/5   Left Ankle Dorsiflexion 4-/5   Balance   Balance Assessed Yes   Standardized Balance Assessment   Standardized Balance Assessment Timed Up and Go Test   Timed Up and Go Test   TUG Normal TUG   Normal TUG (seconds) 9.87                   [facility] Adult PT Treatment/Exercise - 02/14/15 1110    Exercises   Exercises Shoulder;Knee/Hip   Knee/Hip Exercises: Standing   Heel Raises 10 reps   Functional Squat 10 reps   Knee/Hip Exercises: Supine    Bridges Both;10 reps   Shoulder Exercises: Seated   Retraction Both;10 reps   Internal Rotation Strengthening;Both;10 reps  with ER   Internal Rotation Weight (lbs) 2   Flexion Both;5 reps   Flexion Weight (lbs) 2   Abduction Both;5 reps  ABduction Weight (lbs) 2                PT Education - 2015-02-26 1427    Education provided Yes   Education Details HEP for UE and LE; went over [facility] booklet    Person(s) Educated Patient          PT Short Term Goals - 2015/02/26 1427    PT SHORT TERM GOAL #1   Title I HEP   Time 3   Period Days   PT SHORT TERM GOAL #2   Title Pt to be able to complet 8 sit to stand in 30 seconds to demonstrate improved power   Time 2   Period Weeks   PT SHORT TERM GOAL #3   Title Pt to be able to stand for 10 minutes to be able to make a small meal or carry on a conversation outside    Time 2   Period Weeks           PT Long Term Goals - 2015-02-26 1429    PT LONG TERM GOAL #1   Title I in advance HEP   Time 4   Period Weeks   PT LONG TERM GOAL #2   Title Pt to be able to complete 11 Sit to stand to demonstrate normal power for age group.   Time 4   Period Weeks   PT LONG TERM GOAL #3   Title Pt strength to have improved by one grade to allow strength to decrease to no more than a 4/10 80% of the time    Time 4   Period Weeks   PT LONG TERM GOAL #4   Title Pt to be able to stand for 20 minutes to socialize durihg activities    Time 4   Period Weeks   PT LONG TERM GOAL #5   Title Pt to verbalize that his fatigue is 30% improved   Time 4   Period Weeks   Additional Long Term Goals   Additional Long Term Goals Yes   PT LONG TERM GOAL #6   Title PT to be walking at least 20 minutes a day  4 days a week for better health habiits.    Time 4   Period Weeks               Plan - Feb 26, 2015 1422    Clinical Impression Statement Mr. Babe is a 80 yo male who has severe fatigue and UE pain after going through several bouts of  chemotherapy.  He has been referred to out patient physical theapy to maximize the pt functional ability.  Examination demonstrates decreased strength, decreased power and increrased pain.  Mr Debruyne will bneefit from skilled PT to address these issues and  improve his quality of life    Pt will benefit from skilled therapeutic intervention in order to improve on the following deficits Decreased activity tolerance;Pain;Impaired perceived functional ability;Decreased strength   Rehab Potential Good   PT Frequency 2x / week   PT Duration 4 weeks   PT Treatment/Interventions Neuromuscular re-education;Patient/family education;Stair training;Functional mobility training;Therapeutic activities;Therapeutic exercise   PT Next Visit Plan begin postural t-band exercises; lateral and forward step ups, Warrior I and II poses; UBE and Nustep.  Progress to machines as able.    PT Home Exercise Plan yes   Consulted and Agree with Plan of Care Patient          G-Codes - 02/26/15 1435    Functional  Assessment Tool Used FACIT   Functional Limitation Mobility: Walking and moving around;Other PT primary   Other PT Primary Current Status [phone]) At least 40 percent but less than 60 percent impaired, limited or restricted   Other PT Primary Goal Status (N8871) At least 20 percent but less than 40 percent impaired, limited or restricted       Problem List Patient Active Problem List   Diagnosis Date Noted  . B12 deficiency 02/07/2014  . Diverticulosis of colon with hemorrhage 01/01/2014  . Thrombocytopenia, unspecified 01/01/2014  . Acute blood loss anemia 01/01/2014  . Leukopenia 12/31/2013  . Lower GI bleed 12/30/2013  . Diverticulitis 12/30/2013  . Abdominal pain, left lower quadrant 11/16/2013  . Abdominal pain, unspecified site 11/16/2013  . Mantle cell lymphoma 09/07/2013  . Prostate cancer 09/07/2013  . Proctitis, 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  . H/O ulcerative colitis 04/12/2013  . OSA (obstructive  sleep apnea) 08/15/2012  . VASOMOTOR RHINITIS 01/23/2010  . GERD 01/23/2010  . Mixed hyperlipidemia 11/23/2009  . HYPERTENSION, BENIGN 11/23/2009  . Coronary atherosclerosis of native coronary artery 02/18/2009   Rayetta Humphrey, PT CLT [phone] 02/14/2015, 2:37 PM  [facility] [address] [place], [place], [address] Phone: [phone]   Fax:  [phone]

## 2015-02-28 ENCOUNTER — Ambulatory Visit (HOSPITAL_COMMUNITY): Payer: Medicare Other | Attending: Hematology & Oncology | Admitting: Physical Therapy

## 2015-02-28 DIAGNOSIS — T732XXA Exhaustion due to exposure, initial encounter: Secondary | ICD-10-CM

## 2015-02-28 DIAGNOSIS — R52 Pain, unspecified: Secondary | ICD-10-CM | POA: Diagnosis not present

## 2015-02-28 DIAGNOSIS — M6281 Muscle weakness (generalized): Secondary | ICD-10-CM | POA: Diagnosis not present

## 2015-02-28 DIAGNOSIS — R531 Weakness: Secondary | ICD-10-CM

## 2015-02-28 DIAGNOSIS — D61818 Other pancytopenia: Secondary | ICD-10-CM | POA: Diagnosis not present

## 2015-02-28 DIAGNOSIS — R5382 Chronic fatigue, unspecified: Secondary | ICD-10-CM | POA: Diagnosis not present

## 2015-02-28 DIAGNOSIS — R251 Tremor, unspecified: Secondary | ICD-10-CM | POA: Diagnosis not present

## 2015-02-28 NOTE — Therapy (Signed)
[place], [place], [address] Phone: [phone]   Fax:  [phone]  Physical Therapy Treatment  Patient Details  Name: [name] MRN: [id number] Date of Birth: May 16, 1935 Referring Provider:  Patrici Ranks, MD  Encounter Date: 02/28/2015      PT End of Session - 02/28/15 1108    Visit Number 2   Number of Visits 8   Date for PT Re-Evaluation 03/16/15   Authorization Type medicare   Authorization - Visit Number 2   Authorization - Number of Visits 10   PT Start Time 1020   PT Stop Time 1110   PT Time Calculation (min) 50 min   Activity Tolerance Patient tolerated treatment well   Behavior During Therapy [facility] for tasks assessed/performed      Past Medical History  Diagnosis Date  . Type 2 diabetes mellitus   . 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 Mixed hyperlipidemia   . PVD (peripheral vascular disease)   . Essential hypertension, benign   . Prostate cancer   . Colon cancer   . B12 deficiency 02/07/2014  . Obstructive sleep apnea     does not use  . GERD (gastroesophageal reflux disease)     Past Surgical History  Procedure Laterality Date  . Tonsillectomy    . Back surgery    . Vasectomy    . Knee surgery Right     total knee  . Shoulder surgery    . Prostatectomy    . Colonoscopy with esophagogastroduodenoscopy (egd) N/A 08/27/2013    Procedure: COLONOSCOPY WITH ESOPHAGOGASTRODUODENOSCOPY (EGD);  Surgeon: Rogene Houston, MD;  [facility];  Service: Endoscopy;  Laterality: N/A;  730  . Balloon dilation N/A 08/27/2013    Procedure: BALLOON DILATION;  Surgeon: Rogene Houston, MD;  [facility];  Service: Endoscopy;  Laterality: N/A;  Venia Minks dilation N/A 08/27/2013    Procedure: Venia Minks DILATION;  Surgeon: Rogene Houston, MD;  [facility];  Service:  Endoscopy;  Laterality: N/A;  . Savory dilation N/A 08/27/2013    Procedure: SAVORY DILATION;  Surgeon: Rogene Houston, MD;  [facility];  Service: Endoscopy;  Laterality: N/A;  . Portacath placement Right 2014  . Colonoscopy N/A 12/17/2013    Procedure: COLONOSCOPY;  Surgeon: Rogene Houston, MD;  [facility];  Service: Endoscopy;  Laterality: N/A;  940  . Colon surgery    . Knee arthroscopy Left   . Removal of port Right   . Colonoscopy N/A 02/28/2014    Procedure: COLONOSCOPY;  Surgeon: Rogene Houston, MD;  [facility];  Service: Endoscopy;  Laterality: N/A;  730    There were no vitals filed for this visit.  Visit Diagnosis:  Fatigue due to exposure, initial encounter  Decreased strength  Pain      Subjective Assessment - 02/28/15 1030    Subjective Pt comes today wearing his pedometer, however states he has not been recording his steps dailiy.  Encouraged patient to do so.  Pt reports his arms, legs, back and sides hurt today at 7/10.   Currently in Pain? Yes   Pain Score 7                          [facility] Adult PT  Treatment/Exercise - 02/28/15 1031    Knee/Hip Exercises: Aerobic   Stationary Bike nustep at end 8 minutes UE/LE level 3 hills #3   Knee/Hip Exercises: Standing   Heel Raises 10 reps   Heel Raises Limitations toeraises 10 reps   Forward Lunges Both;10 reps   Forward Lunges Limitations 4" step   Lateral Step Up Both;10 reps;Step Height: 4";Hand Hold: 1   Forward Step Up Both;10 reps;Step Height: 4";Hand Hold: 1   Step Down Both;10 reps;Step Height: 4";Hand Hold: 1   Functional Squat 10 reps   Other Standing Knee Exercises --   Shoulder Exercises: Standing   External Rotation Both;10 reps   Theraband Level (Shoulder External Rotation) Level 3 (Green)   Internal Rotation Both;10 reps   Theraband Level (Shoulder Internal Rotation) Level 3 (Green)   Extension Both;10 reps   Theraband Level (Shoulder  Extension) Level 3 (Green)   Row Both;10 reps   Theraband Level (Shoulder Row) Level 3 (Green)   Retraction Both;10 reps   Theraband Level (Shoulder Retraction) Level 3 (Green)   Shoulder Exercises: ROM/Strengthening   UBE (Upper Arm Bike) 4 minutes backward at beginning                  PT Short Term Goals - 02/14/15 1427    PT SHORT TERM GOAL #1   Title I HEP   Time 3   Period Days   PT SHORT TERM GOAL #2   Title Pt to be able to complet 8 sit to stand in 30 seconds to demonstrate improved power   Time 2   Period Weeks   PT SHORT TERM GOAL #3   Title Pt to be able to stand for 10 minutes to be able to make a small meal or carry on a conversation outside    Time 2   Period Weeks           PT Long Term Goals - 02/14/15 1429    PT LONG TERM GOAL #1   Title I in advance HEP   Time 4   Period Weeks   PT LONG TERM GOAL #2   Title Pt to be able to complete 11 Sit to stand to demonstrate normal power for age group.   Time 4   Period Weeks   PT LONG TERM GOAL #3   Title Pt strength to have improved by one grade to allow strength to decrease to no more than a 4/10 80% of the time    Time 4   Period Weeks   PT LONG TERM GOAL #4   Title Pt to be able to stand for 20 minutes to socialize durihg activities    Time 4   Period Weeks   PT LONG TERM GOAL #5   Title Pt to verbalize that his fatigue is 30% improved   Time 4   Period Weeks   Additional Long Term Goals   Additional Long Term Goals Yes   PT LONG TERM GOAL #6   Title PT to be walking at least 20 minutes a day  4 days a week for better health habiits.    Time 4   Period Weeks               Plan - 02/28/15 1339    Clinical Impression Statement Initiated new exercises/actvities per PT plan.  Pt able to complete with minimal instruction from therapist.  Good form displayed with all exercises.  PT also able to complete all  exercises without rest break or complaints of fatigue.   began session with  UBE and ended with nustep.     PT Next Visit Plan Begin Warrior I and II poses and additional balance actvities.    Progress to machines as able (per PT POC).    PT Home Exercise Plan yes   Consulted and Agree with Plan of Care Patient        Problem List Patient Active Problem List   Diagnosis Date Noted  . B12 deficiency 02/07/2014  . Diverticulosis of colon with hemorrhage 01/01/2014  . Thrombocytopenia, unspecified 01/01/2014  . Acute blood loss anemia 01/01/2014  . Leukopenia 12/31/2013  . Lower GI bleed 12/30/2013  . Diverticulitis 12/30/2013  . Abdominal pain, left lower quadrant 11/16/2013  . Abdominal pain, unspecified site 11/16/2013  . Mantle cell lymphoma 09/07/2013  . Prostate cancer 09/07/2013  . Proctitis, 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  . H/O ulcerative colitis 04/12/2013  . OSA (obstructive sleep apnea) 08/15/2012  . VASOMOTOR RHINITIS 01/23/2010  . GERD 01/23/2010  . Mixed hyperlipidemia 11/23/2009  . HYPERTENSION, BENIGN 11/23/2009  . Coronary atherosclerosis of native coronary artery 02/18/2009    Teena Irani, PTA/CLT [phone]  02/28/2015, 1:46 PM  [facility] [address] [place], [place], [address] Phone: [phone]   Fax:  [phone]

## 2015-03-02 ENCOUNTER — Encounter (HOSPITAL_COMMUNITY): Payer: Self-pay

## 2015-03-02 ENCOUNTER — Ambulatory Visit (HOSPITAL_COMMUNITY): Payer: Medicare Other | Admitting: Physical Therapy

## 2015-03-02 ENCOUNTER — Encounter (HOSPITAL_COMMUNITY): Payer: Medicare Other | Attending: Hematology and Oncology

## 2015-03-02 VITALS — BP 138/59 | HR 70 | Resp 18

## 2015-03-02 DIAGNOSIS — E538 Deficiency of other specified B group vitamins: Secondary | ICD-10-CM | POA: Diagnosis not present

## 2015-03-02 DIAGNOSIS — R52 Pain, unspecified: Secondary | ICD-10-CM | POA: Diagnosis not present

## 2015-03-02 DIAGNOSIS — C8310 Mantle cell lymphoma, unspecified site: Secondary | ICD-10-CM | POA: Diagnosis present

## 2015-03-02 DIAGNOSIS — T732XXA Exhaustion due to exposure, initial encounter: Secondary | ICD-10-CM

## 2015-03-02 DIAGNOSIS — D61818 Other pancytopenia: Secondary | ICD-10-CM | POA: Diagnosis not present

## 2015-03-02 DIAGNOSIS — R5382 Chronic fatigue, unspecified: Secondary | ICD-10-CM | POA: Diagnosis not present

## 2015-03-02 DIAGNOSIS — M6281 Muscle weakness (generalized): Secondary | ICD-10-CM | POA: Diagnosis not present

## 2015-03-02 DIAGNOSIS — R531 Weakness: Secondary | ICD-10-CM

## 2015-03-02 DIAGNOSIS — R251 Tremor, unspecified: Secondary | ICD-10-CM | POA: Diagnosis not present

## 2015-03-02 MED ORDER — CYANOCOBALAMIN 1000 MCG/ML IJ SOLN
1000.0000 ug | Freq: Once | INTRAMUSCULAR | Status: AC
  Administered 2015-03-02: 1000 ug via INTRAMUSCULAR

## 2015-03-02 MED ORDER — CYANOCOBALAMIN 1000 MCG/ML IJ SOLN
INTRAMUSCULAR | Status: AC
  Filled 2015-03-02: qty 1

## 2015-03-02 NOTE — Therapy (Signed)
[place], [place], [address] Phone: [phone]   Fax:  [phone]  Physical Therapy Treatment  Patient Details  Name: [name] MRN: [id number] Date of Birth: [birth year] Referring Provider:  Patrici Ranks, MD  Encounter Date: 03/02/2015      PT End of Session - 03/02/15 1708    Visit Number 3   Number of Visits 8   Date for PT Re-Evaluation 03/16/15   Authorization Type medicare   Authorization - Visit Number 3   Authorization - Number of Visits 10   PT Start Time 9379   PT Stop Time 1655   PT Time Calculation (min) 50 min   Activity Tolerance Patient tolerated treatment well   Behavior During Therapy [facility] for tasks assessed/performed      Past Medical History  Diagnosis Date  . Type 2 diabetes mellitus   . 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 Mixed hyperlipidemia   . PVD (peripheral vascular disease)   . Essential hypertension, benign   . Prostate cancer   . Colon cancer   . B12 deficiency 02/07/2014  . Obstructive sleep apnea     does not use  . GERD (gastroesophageal reflux disease)     Past Surgical History  Procedure Laterality Date  . Tonsillectomy    . Back surgery    . Vasectomy    . Knee surgery Right     total knee  . Shoulder surgery    . Prostatectomy    . Colonoscopy with esophagogastroduodenoscopy (egd) N/A 08/27/2013    Procedure: COLONOSCOPY WITH ESOPHAGOGASTRODUODENOSCOPY (EGD);  Surgeon: Rogene Houston, MD;  [facility];  Service: Endoscopy;  Laterality: N/A;  730  . Balloon dilation N/A 08/27/2013    Procedure: BALLOON DILATION;  Surgeon: Rogene Houston, MD;  [facility];  Service: Endoscopy;  Laterality: N/A;  Venia Minks dilation N/A 08/27/2013    Procedure: Venia Minks DILATION;  Surgeon: Rogene Houston, MD;  [facility];  Service:  Endoscopy;  Laterality: N/A;  . Savory dilation N/A 08/27/2013    Procedure: SAVORY DILATION;  Surgeon: Rogene Houston, MD;  [facility];  Service: Endoscopy;  Laterality: N/A;  . Portacath placement Right 2014  . Colonoscopy N/A 12/17/2013    Procedure: COLONOSCOPY;  Surgeon: Rogene Houston, MD;  [facility];  Service: Endoscopy;  Laterality: N/A;  940  . Colon surgery    . Knee arthroscopy Left   . Removal of port Right   . Colonoscopy N/A 02/28/2014    Procedure: COLONOSCOPY;  Surgeon: Rogene Houston, MD;  [facility];  Service: Endoscopy;  Laterality: N/A;  730    There were no vitals filed for this visit.  Visit Diagnosis:  Fatigue due to exposure, initial encounter  Decreased strength  Pain      Subjective Assessment - 03/02/15 1606    Subjective PT states he always has pain.  Currently at 4/10 (lower than last visit at 7/10)   Currently in Pain? Yes   Pain Score 4    Pain Location Arm                         [facility] Adult PT Treatment/Exercise - 03/02/15 1645    Knee/Hip Exercises: Aerobic   Stationary Bike  nustep at end 10 minutes UE/LE level 3 hills #3   Knee/Hip Exercises: Standing   Heel Raises 15 reps   Heel Raises Limitations toeraises 15 reps   Forward Lunges Both;10 reps   Forward Lunges Limitations 4" step   Lateral Step Up Both;10 reps;Step Height: 4";Hand Hold: 1   Forward Step Up Both;10 reps;Step Height: 4";Hand Hold: 1   Step Down Both;10 reps;Step Height: 4";Hand Hold: 1   Functional Squat 15 reps   Other Standing Knee Exercises sit to stand 10 reps no UE's   Shoulder Exercises: Standing   External Rotation Both;10 reps   Theraband Level (Shoulder External Rotation) Level 3 (Green)   Internal Rotation Both;10 reps   Theraband Level (Shoulder Internal Rotation) Level 3 (Green)   Extension Both;10 reps   Theraband Level (Shoulder Extension) Level 3 (Green)   Row Both;10 reps   Theraband Level  (Shoulder Row) Level 3 (Green)   Retraction Both;10 reps   Theraband Level (Shoulder Retraction) Level 3 (Green)   Shoulder Exercises: ROM/Strengthening   Wall Wash 1 minute each UE   Wall Pushups 10 reps                  PT Short Term Goals - 02/14/15 1427    PT SHORT TERM GOAL #1   Title I HEP   Time 3   Period Days   PT SHORT TERM GOAL #2   Title Pt to be able to complet 8 sit to stand in 30 seconds to demonstrate improved power   Time 2   Period Weeks   PT SHORT TERM GOAL #3   Title Pt to be able to stand for 10 minutes to be able to make a small meal or carry on a conversation outside    Time 2   Period Weeks           PT Long Term Goals - 02/14/15 1429    PT LONG TERM GOAL #1   Title I in advance HEP   Time 4   Period Weeks   PT LONG TERM GOAL #2   Title Pt to be able to complete 11 Sit to stand to demonstrate normal power for age group.   Time 4   Period Weeks   PT LONG TERM GOAL #3   Title Pt strength to have improved by one grade to allow strength to decrease to no more than a 4/10 80% of the time    Time 4   Period Weeks   PT LONG TERM GOAL #4   Title Pt to be able to stand for 20 minutes to socialize durihg activities    Time 4   Period Weeks   PT LONG TERM GOAL #5   Title Pt to verbalize that his fatigue is 30% improved   Time 4   Period Weeks   Additional Long Term Goals   Additional Long Term Goals Yes   PT LONG TERM GOAL #6   Title PT to be walking at least 20 minutes a day  4 days a week for better health habiits.    Time 4   Period Weeks               Plan - 03/02/15 1713    Clinical Impression Statement Progressed with wall wash actviity UE push ups and sit to stand actvity to increase stability and strength.  PT motivated and in great mood today.  Able to complete all activities without diffiuculty or  rest breaks.  Overall pain reduction and increasing actvity.  Currently wtih 1200 steps recorded on pedometer and reports  he has been recording steps regularly in his STAR booklet.     PT Next Visit Plan progress to strengthening machines as able to increase strength and continue to increase actvitiy tolerance.   Consulted and Agree with Plan of Care Patient        Problem List Patient Active Problem List   Diagnosis Date Noted  . B12 deficiency 02/07/2014  . Diverticulosis of colon with hemorrhage 01/01/2014  . Thrombocytopenia, unspecified 01/01/2014  . Acute blood loss anemia 01/01/2014  . Leukopenia 12/31/2013  . Lower GI bleed 12/30/2013  . Diverticulitis 12/30/2013  . Abdominal pain, left lower quadrant 11/16/2013  . Abdominal pain, unspecified site 11/16/2013  . Mantle cell lymphoma 09/07/2013  . Prostate cancer 09/07/2013  . Proctitis, 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  . H/O ulcerative colitis 04/12/2013  . OSA (obstructive sleep apnea) 08/15/2012  . VASOMOTOR RHINITIS 01/23/2010  . GERD 01/23/2010  . Mixed hyperlipidemia 11/23/2009  . HYPERTENSION, BENIGN 11/23/2009  . Coronary atherosclerosis of native coronary artery 02/18/2009    Teena Irani, PTA/CLT [phone] 03/02/2015, 5:25 PM  [facility] [address] [place], [place], [address] Phone: [phone]   Fax:  [phone]

## 2015-03-02 NOTE — Patient Instructions (Signed)
.  [place] at [facility] Discharge Instructions  RECOMMENDATIONS MADE BY THE CONSULTANT AND ANY TEST RESULTS WILL BE SENT TO YOUR REFERRING PHYSICIAN.  You received your B12 injections today. No changes today. See you at planned.   Thank you for choosing [place] at [facility] to provide your oncology and hematology care.  To afford each patient quality time with our provider, please arrive at least 15 minutes before your scheduled appointment time.    You need to re-schedule your appointment should you arrive 10 or more minutes late.  We strive to give you quality time with our providers, and arriving late affects you and other patients whose appointments are after yours.  Also, if you no show three or more times for appointments you may be dismissed from the clinic at the providers discretion.     Again, thank you for choosing [facility].  Our hope is that these requests will decrease the amount of time that you wait before being seen by our physicians.       _____________________________________________________________  Should you have questions after your visit to [facility], please contact our office at [phone] between the hours of 8:30 a.m. and 4:30 p.m.  Voicemails left after 4:30 p.m. will not be returned until the following business day.  For prescription refill requests, have your pharmacy contact our office.

## 2015-03-02 NOTE — Progress Notes (Signed)
[name] presents today for injection per MD orders. B12 1023mg administered SQ in left Upper Arm. Administration without incident. Patient tolerated well.

## 2015-03-06 DIAGNOSIS — C8310 Mantle cell lymphoma, unspecified site: Secondary | ICD-10-CM | POA: Diagnosis not present

## 2015-03-06 DIAGNOSIS — R2681 Unsteadiness on feet: Secondary | ICD-10-CM | POA: Diagnosis not present

## 2015-03-06 DIAGNOSIS — G20 Parkinson's disease: Secondary | ICD-10-CM | POA: Diagnosis not present

## 2015-03-06 DIAGNOSIS — M159 Polyosteoarthritis, unspecified: Secondary | ICD-10-CM | POA: Diagnosis not present

## 2015-03-07 ENCOUNTER — Ambulatory Visit (HOSPITAL_COMMUNITY): Payer: Medicare Other | Admitting: Physical Therapy

## 2015-03-07 DIAGNOSIS — D61818 Other pancytopenia: Secondary | ICD-10-CM | POA: Diagnosis not present

## 2015-03-07 DIAGNOSIS — M6281 Muscle weakness (generalized): Secondary | ICD-10-CM | POA: Diagnosis not present

## 2015-03-07 DIAGNOSIS — R5382 Chronic fatigue, unspecified: Secondary | ICD-10-CM | POA: Diagnosis not present

## 2015-03-07 DIAGNOSIS — R251 Tremor, unspecified: Secondary | ICD-10-CM | POA: Diagnosis not present

## 2015-03-07 DIAGNOSIS — T732XXA Exhaustion due to exposure, initial encounter: Secondary | ICD-10-CM

## 2015-03-07 DIAGNOSIS — R531 Weakness: Secondary | ICD-10-CM

## 2015-03-07 DIAGNOSIS — R52 Pain, unspecified: Secondary | ICD-10-CM | POA: Diagnosis not present

## 2015-03-07 NOTE — Therapy (Signed)
[place], [place], [address] Phone: [phone]   Fax:  [phone]  Physical Therapy Treatment  Patient Details  Name: [name] MRN: [id number] Date of Birth: November 20, 1934 Referring Provider:  Patrici Ranks, MD  Encounter Date: 03/07/2015      PT End of Session - 03/07/15 1023    Visit Number 4   Number of Visits 8   Date for PT Re-Evaluation 03/16/15   Authorization Type medicare   Authorization - Visit Number 4   Authorization - Number of Visits 8   PT Start Time 0930   PT Stop Time 1028   PT Time Calculation (min) 58 min   Activity Tolerance Patient tolerated treatment well      Past Medical History  Diagnosis Date  . Type 2 diabetes mellitus   . 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 Mixed hyperlipidemia   . PVD (peripheral vascular disease)   . Essential hypertension, benign   . Prostate cancer   . Colon cancer   . B12 deficiency 02/07/2014  . Obstructive sleep apnea     does not use  . GERD (gastroesophageal reflux disease)     Past Surgical History  Procedure Laterality Date  . Tonsillectomy    . Back surgery    . Vasectomy    . Knee surgery Right     total knee  . Shoulder surgery    . Prostatectomy    . Colonoscopy with esophagogastroduodenoscopy (egd) N/A 08/27/2013    Procedure: COLONOSCOPY WITH ESOPHAGOGASTRODUODENOSCOPY (EGD);  Surgeon: Rogene Houston, MD;  [facility];  Service: Endoscopy;  Laterality: N/A;  730  . Balloon dilation N/A 08/27/2013    Procedure: BALLOON DILATION;  Surgeon: Rogene Houston, MD;  [facility];  Service: Endoscopy;  Laterality: N/A;  Venia Minks dilation N/A 08/27/2013    Procedure: Venia Minks DILATION;  Surgeon: Rogene Houston, MD;  [facility];  Service: Endoscopy;  Laterality: N/A;  . Savory dilation N/A  08/27/2013    Procedure: SAVORY DILATION;  Surgeon: Rogene Houston, MD;  [facility];  Service: Endoscopy;  Laterality: N/A;  . Portacath placement Right 2014  . Colonoscopy N/A 12/17/2013    Procedure: COLONOSCOPY;  Surgeon: Rogene Houston, MD;  [facility];  Service: Endoscopy;  Laterality: N/A;  940  . Colon surgery    . Knee arthroscopy Left   . Removal of port Right   . Colonoscopy N/A 02/28/2014    Procedure: COLONOSCOPY;  Surgeon: Rogene Houston, MD;  [facility];  Service: Endoscopy;  Laterality: N/A;  730    There were no vitals filed for this visit.  Visit Diagnosis:  Fatigue due to exposure, initial encounter  Decreased strength  Pain      Subjective Assessment - 03/07/15 1018    Subjective Pt states he is done in.  States he has not energy and shoulders are constantly hurting.  Pt states he is walking between 1500 and 2000 steps a day.   Currently in Pain? Yes   Pain Score 7              [facility] Adult PT Treatment/Exercise - 03/07/15 0001    Balance Poses: Yoga   Warrior I 2 reps;30 seconds   Warrior II 2 reps;30 seconds  Knee/Hip Exercises: Aerobic   Stationary Bike nustep at end 10 minutes UE/LE level 3 hills #3   Knee/Hip Exercises: Standing   Lateral Step Up Both;15 reps;Hand Hold: 0;Step Height: 6"   Forward Step Up Both;15 reps;Step Height: 6"   Other Standing Knee Exercises back at wall with B UE flexion x 10 keeping core tight    Shoulder Exercises: Seated   Other Seated Exercises cybex row and press 1.5 pl each x 10 reps    Shoulder Exercises: Standing   External Rotation Both;10 reps   Theraband Level (Shoulder External Rotation) Level 3 (Green)   Extension Both;10 reps   Theraband Level (Shoulder Extension) Level 3 (Green)   Row Both;10 reps   Theraband Level (Shoulder Row) Level 3 (Green)   Retraction Both;10 reps   Theraband Level (Shoulder Retraction) Level 3 (Green)   Other Standing Exercises shoulder  rolls and posterior capsule stretch                 PT Education - 03/07/15 1025    Education provided Yes   Education Details new HEP wih warrior poses and the importace of increasing steps    Person(s) Educated Patient   Methods Explanation;Demonstration;Handout   Comprehension Verbalized understanding;Returned demonstration          PT Short Term Goals - 02/14/15 1427    PT SHORT TERM GOAL #1   Title I HEP   Time 3   Period Days   PT SHORT TERM GOAL #2   Title Pt to be able to complet 8 sit to stand in 30 seconds to demonstrate improved power   Time 2   Period Weeks   PT SHORT TERM GOAL #3   Title Pt to be able to stand for 10 minutes to be able to make a small meal or carry on a conversation outside    Time 2   Period Weeks           PT Long Term Goals - 02/14/15 1429    PT LONG TERM GOAL #1   Title I in advance HEP   Time 4   Period Weeks   PT LONG TERM GOAL #2   Title Pt to be able to complete 11 Sit to stand to demonstrate normal power for age group.   Time 4   Period Weeks   PT LONG TERM GOAL #3   Title Pt strength to have improved by one grade to allow strength to decrease to no more than a 4/10 80% of the time    Time 4   Period Weeks   PT LONG TERM GOAL #4   Title Pt to be able to stand for 20 minutes to socialize durihg activities    Time 4   Period Weeks   PT LONG TERM GOAL #5   Title Pt to verbalize that his fatigue is 30% improved   Time 4   Period Weeks   Additional Long Term Goals   Additional Long Term Goals Yes   PT LONG TERM GOAL #6   Title PT to be walking at least 20 minutes a day  4 days a week for better health habiits.    Time 4   Period Weeks               Plan - 03/07/15 1024    Clinical Impression Statement Discussed with pt the need to increase steps per day.  Given t-band to complete t-band exercises at home.  Began warrior poses for balance and UE endurance.    PT Next Visit Plan begin cybex lat pull  downs.          Problem List Patient Active Problem List   Diagnosis Date Noted  . B12 deficiency 02/07/2014  . Diverticulosis of colon with hemorrhage 01/01/2014  . Thrombocytopenia, unspecified 01/01/2014  . Acute blood loss anemia 01/01/2014  . Leukopenia 12/31/2013  . Lower GI bleed 12/30/2013  . Diverticulitis 12/30/2013  . Abdominal pain, left lower quadrant 11/16/2013  . Abdominal pain, unspecified site 11/16/2013  . Mantle cell lymphoma 09/07/2013  . Prostate cancer 09/07/2013  . Proctitis, 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  . H/O ulcerative colitis 04/12/2013  . OSA (obstructive sleep apnea) 08/15/2012  . VASOMOTOR RHINITIS 01/23/2010  . GERD 01/23/2010  . Mixed hyperlipidemia 11/23/2009  . HYPERTENSION, BENIGN 11/23/2009  . Coronary atherosclerosis of native coronary artery 02/18/2009   Rayetta Humphrey, PT CLT [phone] 03/07/2015, 10:27 AM  [facility] [address] [place], [place], [address] Phone: [phone]   Fax:  [phone]

## 2015-03-09 ENCOUNTER — Ambulatory Visit (HOSPITAL_COMMUNITY): Payer: Medicare Other

## 2015-03-09 DIAGNOSIS — R52 Pain, unspecified: Secondary | ICD-10-CM | POA: Diagnosis not present

## 2015-03-09 DIAGNOSIS — D61818 Other pancytopenia: Secondary | ICD-10-CM | POA: Diagnosis not present

## 2015-03-09 DIAGNOSIS — R531 Weakness: Secondary | ICD-10-CM

## 2015-03-09 DIAGNOSIS — R5382 Chronic fatigue, unspecified: Secondary | ICD-10-CM | POA: Diagnosis not present

## 2015-03-09 DIAGNOSIS — M6281 Muscle weakness (generalized): Secondary | ICD-10-CM | POA: Diagnosis not present

## 2015-03-09 DIAGNOSIS — R251 Tremor, unspecified: Secondary | ICD-10-CM | POA: Diagnosis not present

## 2015-03-09 DIAGNOSIS — T732XXA Exhaustion due to exposure, initial encounter: Secondary | ICD-10-CM

## 2015-03-09 NOTE — Therapy (Signed)
[facility] [address] [place], [place], [address] Phone: [phone]   Fax:  [phone]  Physical Therapy Treatment  Patient Details  Name: [name] MRN: [id number] Date of Birth: [birth year] Referring Provider:  Patrici Ranks, MD  Encounter Date: 03/09/2015      PT End of Session - 03/09/15 0844    Visit Number 5   Number of Visits 8   Date for PT Re-Evaluation 03/16/15   Authorization Type medicare   Authorization - Visit Number 5   Authorization - Number of Visits 8   PT Start Time 0800   PT Stop Time 0853   PT Time Calculation (min) 53 min   Activity Tolerance Patient tolerated treatment well;Patient limited by fatigue   Behavior During Therapy [facility] for tasks assessed/performed      Past Medical History  Diagnosis Date  . Type 2 diabetes mellitus   . 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 Mixed hyperlipidemia   . PVD (peripheral vascular disease)   . Essential hypertension, benign   . Prostate cancer   . Colon cancer   . B12 deficiency 02/07/2014  . Obstructive sleep apnea     does not use  . GERD (gastroesophageal reflux disease)     Past Surgical History  Procedure Laterality Date  . Tonsillectomy    . Back surgery    . Vasectomy    . Knee surgery Right     total knee  . Shoulder surgery    . Prostatectomy    . Colonoscopy with esophagogastroduodenoscopy (egd) N/A 08/27/2013    Procedure: COLONOSCOPY WITH ESOPHAGOGASTRODUODENOSCOPY (EGD);  Surgeon: Rogene Houston, MD;  [facility];  Service: Endoscopy;  Laterality: N/A;  730  . Balloon dilation N/A 08/27/2013    Procedure: BALLOON DILATION;  Surgeon: Rogene Houston, MD;  [facility];  Service: Endoscopy;  Laterality: N/A;  Venia Minks dilation N/A 08/27/2013    Procedure: Venia Minks DILATION;  Surgeon: Rogene Houston, MD;   [facility];  Service: Endoscopy;  Laterality: N/A;  . Savory dilation N/A 08/27/2013    Procedure: SAVORY DILATION;  Surgeon: Rogene Houston, MD;  [facility];  Service: Endoscopy;  Laterality: N/A;  . Portacath placement Right 2014  . Colonoscopy N/A 12/17/2013    Procedure: COLONOSCOPY;  Surgeon: Rogene Houston, MD;  [facility];  Service: Endoscopy;  Laterality: N/A;  940  . Colon surgery    . Knee arthroscopy Left   . Removal of port Right   . Colonoscopy N/A 02/28/2014    Procedure: COLONOSCOPY;  Surgeon: Rogene Houston, MD;  [facility];  Service: Endoscopy;  Laterality: N/A;  730    There were no vitals filed for this visit.  Visit Diagnosis:  Fatigue due to exposure, initial encounter  Decreased strength  Pain      Subjective Assessment - 03/09/15 0806    Subjective Pt stated he is wore out, worked and [employer] yard yesterday but was unable to complete HEP due to fatigue.  Pt c/o Bil shoulder pain scale 6/10.  Continues to monitor steps per day   Currently in Pain? Yes   Pain Score 6    Pain Location Shoulder   Pain Orientation Right;Left            [facility] PT Assessment -  03/09/15 0001    Assessment   Medical Diagnosis fatigue   Hand Dominance Right   Next MD Visit [facility] 05/01/2015   Prior Therapy none   Precautions   Precautions None                     [facility] Adult PT Treatment/Exercise - 03/09/15 0001    Balance Poses: Yoga   Warrior I 2 reps;30 seconds   Warrior II 2 reps;30 seconds   Exercises   Exercises Shoulder;Knee/Hip   Knee/Hip Exercises: Aerobic   Stationary Bike nustep at end 10 minutes UE/LE level 3 hills #4   Knee/Hip Exercises: Standing   Lateral Step Up Both;15 reps;Hand Hold: 0;Step Height: 6"   Forward Step Up Both;15 reps;Step Height: 6"   Step Down Both;10 reps;Hand Hold: 2;Step Height: 6"   Shoulder Exercises: Standing   Other Standing Exercises shoulder rolls and posterior  capsule stretch    Other Standing Exercises Standing infront of door Bil shoulder flexion for posture   Knee/Hip Exercises: Machines for Strengthening   Total Gym Leg Press 2 PL chest press and rows, 4 Pl Cybex latissimus dorsi 10 reps each                  PT Short Term Goals - 03/09/15 0858    PT SHORT TERM GOAL #1   Title I HEP   Status On-going   PT SHORT TERM GOAL #2   Title Pt to be able to complet 8 sit to stand in 30 seconds to demonstrate improved power   Status On-going   PT SHORT TERM GOAL #3   Title Pt to be able to stand for 10 minutes to be able to make a small meal or carry on a conversation outside    Status On-going           PT Long Term Goals - 03/09/15 0858    PT LONG TERM GOAL #1   Title I in advance HEP   Status On-going   PT LONG TERM GOAL #2   Title Pt to be able to complete 11 Sit to stand to demonstrate normal power for age group.   Status On-going   PT LONG TERM GOAL #3   Title Pt strength to have improved by one grade to allow strength to decrease to no more than a 4/10 80% of the time    Status On-going   PT LONG TERM GOAL #4   Title Pt to be able to stand for 20 minutes to socialize durihg activities    PT LONG TERM GOAL #5   Title Pt to verbalize that his fatigue is 30% improved   Status On-going   PT LONG TERM GOAL #6   Title PT to be walking at least 20 minutes a day  4 days a week for better health habiits.                Plan - 03/09/15 0853    Clinical Impression Statement Pt stated he walked 6,000 feet yesterday, reported he walks a lot with work on Wednesdays.  Stated he is trying to walk 2,500 steps per day on average.  Added cybex lat pull down for postural strengthening and continued focus with functional strengthening and activity tolerance to improve balance and UE endurance.  Pt required cueing through session to reduce forward rolled shoulder and trunk flexion.  No reports of increased pain through session.    PT Next Visit Plan  Continue with current PT POC.        Problem List Patient Active Problem List   Diagnosis Date Noted  . B12 deficiency 02/07/2014  . Diverticulosis of colon with hemorrhage 01/01/2014  . Thrombocytopenia, unspecified 01/01/2014  . Acute blood loss anemia 01/01/2014  . Leukopenia 12/31/2013  . Lower GI bleed 12/30/2013  . Diverticulitis 12/30/2013  . Abdominal pain, left lower quadrant 11/16/2013  . Abdominal pain, unspecified site 11/16/2013  . Mantle cell lymphoma 09/07/2013  . Prostate cancer 09/07/2013  . Proctitis, 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  . H/O ulcerative colitis 04/12/2013  . OSA (obstructive sleep apnea) 08/15/2012  . VASOMOTOR RHINITIS 01/23/2010  . GERD 01/23/2010  . Mixed hyperlipidemia 11/23/2009  . HYPERTENSION, BENIGN 11/23/2009  . Coronary atherosclerosis of native coronary artery 02/18/2009   Aldona Lento, PTA  Aldona Lento 03/09/2015, [facility] [address] [place], [place], [address] Phone: [phone]   Fax:  [phone]

## 2015-03-13 ENCOUNTER — Ambulatory Visit (HOSPITAL_COMMUNITY): Payer: Medicare Other | Admitting: Physical Therapy

## 2015-03-13 DIAGNOSIS — R52 Pain, unspecified: Secondary | ICD-10-CM | POA: Diagnosis not present

## 2015-03-13 DIAGNOSIS — D61818 Other pancytopenia: Secondary | ICD-10-CM | POA: Diagnosis not present

## 2015-03-13 DIAGNOSIS — R531 Weakness: Secondary | ICD-10-CM

## 2015-03-13 DIAGNOSIS — R251 Tremor, unspecified: Secondary | ICD-10-CM | POA: Diagnosis not present

## 2015-03-13 DIAGNOSIS — R5382 Chronic fatigue, unspecified: Secondary | ICD-10-CM | POA: Diagnosis not present

## 2015-03-13 DIAGNOSIS — M6281 Muscle weakness (generalized): Secondary | ICD-10-CM | POA: Diagnosis not present

## 2015-03-13 DIAGNOSIS — T732XXA Exhaustion due to exposure, initial encounter: Secondary | ICD-10-CM

## 2015-03-13 NOTE — Therapy (Signed)
[facility] [place], [place], [address] Phone: [phone]   Fax:  [phone]  Physical Therapy Treatment  Patient Details  Name: [name] MRN: [id number] Date of Birth: [birth year] Referring Provider:  Patrici Ranks, MD  Encounter Date: 03/13/2015      PT End of Session - 03/13/15 1057    Visit Number 6   Number of Visits 8   Date for PT Re-Evaluation 03/16/15   Authorization Type medicare   Authorization - Visit Number 6   Authorization - Number of Visits 8   PT Start Time 3893   PT Stop Time 1107   PT Time Calculation (min) 48 min      Past Medical History  Diagnosis Date  . Type 2 diabetes mellitus   . 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 Mixed hyperlipidemia   . PVD (peripheral vascular disease)   . Essential hypertension, benign   . Prostate cancer   . Colon cancer   . B12 deficiency 02/07/2014  . Obstructive sleep apnea     does not use  . GERD (gastroesophageal reflux disease)     Past Surgical History  Procedure Laterality Date  . Tonsillectomy    . Back surgery    . Vasectomy    . Knee surgery Right     total knee  . Shoulder surgery    . Prostatectomy    . Colonoscopy with esophagogastroduodenoscopy (egd) N/A 08/27/2013    Procedure: COLONOSCOPY WITH ESOPHAGOGASTRODUODENOSCOPY (EGD);  Surgeon: Rogene Houston, MD;  [facility];  Service: Endoscopy;  Laterality: N/A;  730  . Balloon dilation N/A 08/27/2013    Procedure: BALLOON DILATION;  Surgeon: Rogene Houston, MD;  [facility];  Service: Endoscopy;  Laterality: N/A;  Venia Minks dilation N/A 08/27/2013    Procedure: Venia Minks DILATION;  Surgeon: Rogene Houston, MD;  [facility];  Service: Endoscopy;  Laterality: N/A;  . Savory dilation N/A 08/27/2013    Procedure: SAVORY DILATION;  Surgeon: Rogene Houston, MD;  [facility];  Service: Endoscopy;  Laterality: N/A;  . Portacath placement Right 2014  . Colonoscopy N/A 12/17/2013    Procedure: COLONOSCOPY;  Surgeon: Rogene Houston, MD;  [facility];  Service: Endoscopy;  Laterality: N/A;  940  . Colon surgery    . Knee arthroscopy Left   . Removal of port Right   . Colonoscopy N/A 02/28/2014    Procedure: COLONOSCOPY;  Surgeon: Rogene Houston, MD;  [facility];  Service: Endoscopy;  Laterality: N/A;  730    There were no vitals filed for this visit.  Visit Diagnosis:  Fatigue due to exposure, initial encounter  Decreased strength      Subjective Assessment - 03/13/15 1020    Subjective Pt reports that he feels tired today. He has not progressed his walking program at this point.    Currently in Pain? Yes   Pain Score 4    Pain Location Arm   Pain Orientation Right;Left              [facility] Adult PT Treatment/Exercise - 03/13/15 0001    Balance Poses: Yoga   Warrior I 2 reps;60 seconds   Warrior II 1 rep;60 seconds   Knee/Hip Exercises: Aerobic   Stationary Bike nustep at end 10  minutes UE/LE level 3 hills #4   Shoulder Exercises: Seated   Other Seated Exercises cybex row and press 2.5 pl each x 15 reps    Other Seated Exercises cybex cable column lat pulldown 6 plates x15 reps   Shoulder Exercises: Standing   External Rotation Both;15 reps   External Rotation Weight (lbs) 4 pounds   Other Standing Exercises Standing with back againt wall, BUE flexion x 10 reps   Other Standing Exercises Standing infront of door Bil shoulder flexion for posture   Shoulder Exercises: ROM/Strengthening   UBE (Upper Arm Bike) 2 minutes forward, 2 minutes backward at level 2   Wall Pushups 10 reps   "W" Arms 15 reps with 4 pounds                PT Education - 03/13/15 1056    Education provided Yes   Education Details Importance of proper posture to decrease pain   Person(s) Educated  Patient   Methods Explanation   Comprehension Verbalized understanding          PT Short Term Goals - 03/09/15 0858    PT SHORT TERM GOAL #1   Title I HEP   Status On-going   PT SHORT TERM GOAL #2   Title Pt to be able to complet 8 sit to stand in 30 seconds to demonstrate improved power   Status On-going   PT SHORT TERM GOAL #3   Title Pt to be able to stand for 10 minutes to be able to make a small meal or carry on a conversation outside    Status On-going           PT Long Term Goals - 03/09/15 0858    PT LONG TERM GOAL #1   Title I in advance HEP   Status On-going   PT LONG TERM GOAL #2   Title Pt to be able to complete 11 Sit to stand to demonstrate normal power for age group.   Status On-going   PT LONG TERM GOAL #3   Title Pt strength to have improved by one grade to allow strength to decrease to no more than a 4/10 80% of the time    Status On-going   PT LONG TERM GOAL #4   Title Pt to be able to stand for 20 minutes to socialize durihg activities    PT LONG TERM GOAL #5   Title Pt to verbalize that his fatigue is 30% improved   Status On-going   PT LONG TERM GOAL #6   Title PT to be walking at least 20 minutes a day  4 days a week for better health habiits.                Plan - 03/13/15 1058    Clinical Impression Statement Pt required verbal and tactile cueing for proper shoulder alignment during strengthening and stabilizing exercises. W arms were added to further improve scapular stabilization. Pt denied any increased pain in today's tx.    PT Next Visit Plan Continue with postural strengthening.         Problem List Patient Active Problem List   Diagnosis Date Noted  . B12 deficiency 02/07/2014  . Diverticulosis of colon with hemorrhage 01/01/2014  . Thrombocytopenia, unspecified 01/01/2014  . Acute blood loss anemia 01/01/2014  . Leukopenia 12/31/2013  . Lower GI bleed 12/30/2013  . Diverticulitis 12/30/2013  . Abdominal pain, left  lower quadrant 11/16/2013  . Abdominal pain, unspecified site  11/16/2013  . Mantle cell lymphoma 09/07/2013  . Prostate cancer 09/07/2013  . Proctitis, 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  . H/O ulcerative colitis 04/12/2013  . OSA (obstructive sleep apnea) 08/15/2012  . VASOMOTOR RHINITIS 01/23/2010  . GERD 01/23/2010  . Mixed hyperlipidemia 11/23/2009  . HYPERTENSION, BENIGN 11/23/2009  . Coronary atherosclerosis of native coronary artery 02/18/2009    Rayetta Humphrey, PT CLT [phone] 03/13/2015, 11:00 AM  [facility] [place], [place], [address] Phone: [phone]   Fax:  [phone]

## 2015-03-16 ENCOUNTER — Ambulatory Visit (HOSPITAL_COMMUNITY): Payer: Medicare Other | Admitting: Physical Therapy

## 2015-03-16 DIAGNOSIS — M6281 Muscle weakness (generalized): Secondary | ICD-10-CM | POA: Diagnosis not present

## 2015-03-16 DIAGNOSIS — R251 Tremor, unspecified: Secondary | ICD-10-CM | POA: Diagnosis not present

## 2015-03-16 DIAGNOSIS — T732XXA Exhaustion due to exposure, initial encounter: Secondary | ICD-10-CM

## 2015-03-16 DIAGNOSIS — R531 Weakness: Secondary | ICD-10-CM

## 2015-03-16 DIAGNOSIS — R52 Pain, unspecified: Secondary | ICD-10-CM

## 2015-03-16 DIAGNOSIS — D61818 Other pancytopenia: Secondary | ICD-10-CM | POA: Diagnosis not present

## 2015-03-16 DIAGNOSIS — R5382 Chronic fatigue, unspecified: Secondary | ICD-10-CM | POA: Diagnosis not present

## 2015-03-16 NOTE — Therapy (Signed)
[facility] [address] [place], [place], [address] Phone: [phone]   Fax:  [phone]  Physical Therapy Treatment  Patient Details  Name: [name] MRN: [id number] Date of Birth: May 30, 1935 Referring Provider:  [name], MD  Encounter Date: 03/16/2015      PT End of Session - 03/16/15 1024    Visit Number 7   Number of Visits 8   Date for PT Re-Evaluation 03/16/15   Authorization Type medicare   Authorization - Visit Number 7   Authorization - Number of Visits 8   PT Start Time [phone]   PT Stop Time 1017   PT Time Calculation (min) 41 min   Activity Tolerance Patient tolerated treatment well   Behavior During Therapy [facility] for tasks assessed/performed      Past Medical History  Diagnosis Date  . Type 2 diabetes mellitus   . 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 Mixed hyperlipidemia   . PVD (peripheral vascular disease)   . Essential hypertension, benign   . Prostate cancer   . Colon cancer   . B12 deficiency 02/07/2014  . Obstructive sleep apnea     does not use  . GERD (gastroesophageal reflux disease)     Past Surgical History  Procedure Laterality Date  . Tonsillectomy    . Back surgery    . Vasectomy    . Knee surgery Right     total knee  . Shoulder surgery    . Prostatectomy    . Colonoscopy with esophagogastroduodenoscopy (egd) N/A 08/27/2013    Procedure: COLONOSCOPY WITH ESOPHAGOGASTRODUODENOSCOPY (EGD);  Surgeon: Rogene Houston, MD;  [facility];  Service: Endoscopy;  Laterality: N/A;  730  . Balloon dilation N/A 08/27/2013    Procedure: BALLOON DILATION;  Surgeon: Rogene Houston, MD;  [facility];  Service: Endoscopy;  Laterality: N/A;  Venia Minks dilation N/A 08/27/2013    Procedure: Venia Minks DILATION;  Surgeon: Rogene Houston, MD;  [facility];  Service:  Endoscopy;  Laterality: N/A;  . Savory dilation N/A 08/27/2013    Procedure: SAVORY DILATION;  Surgeon: Rogene Houston, MD;  [facility];  Service: Endoscopy;  Laterality: N/A;  . Portacath placement Right 2014  . Colonoscopy N/A 12/17/2013    Procedure: COLONOSCOPY;  Surgeon: Rogene Houston, MD;  [facility];  Service: Endoscopy;  Laterality: N/A;  940  . Colon surgery    . Knee arthroscopy Left   . Removal of port Right   . Colonoscopy N/A 02/28/2014    Procedure: COLONOSCOPY;  Surgeon: Rogene Houston, MD;  [facility];  Service: Endoscopy;  Laterality: N/A;  730    There were no vitals filed for this visit.  Visit Diagnosis:  Fatigue due to exposure, initial encounter  Decreased strength  Pain      Subjective Assessment - 03/16/15 1017    Subjective Pt states that he is sore but no pain   Currently in Pain? No/denies            [facility] Adult PT Treatment/Exercise - 03/16/15 0001    Balance Poses: Yoga   Warrior I 2 reps;60 seconds   Warrior II 2 reps;60 seconds   Tree Pose 1 rep;30 seconds   Exercises   Exercises Neck   Neck Exercises: Seated   Neck  Retraction 10 reps   Postural Training completed   Other Seated Exercise neck stretch 30" x 3 B   Knee/Hip Exercises: Machines for Strengthening   cybex machines:  2 .5PL chest press and rows, 6 Pl Cybex latissimus dorsi 15 eps each   Knee/Hip Exercises: Standing   Other Standing Knee Exercises back at wall with B UE flexion x 10 keeping core tight; wall stretch facing wall x 5; wall pushup x 10              PT Short Term Goals - 03/09/15 0858    PT SHORT TERM GOAL #1   Title I HEP   Status On-going   PT SHORT TERM GOAL #2   Title Pt to be able to complet 8 sit to stand in 30 seconds to demonstrate improved power   Status On-going   PT SHORT TERM GOAL #3   Title Pt to be able to stand for 10 minutes to be able to make a small meal or carry on a conversation outside     Status On-going           PT Long Term Goals - 03/09/15 0858    PT LONG TERM GOAL #1   Title I in advance HEP   Status On-going   PT LONG TERM GOAL #2   Title Pt to be able to complete 11 Sit to stand to demonstrate normal power for age group.   Status On-going   PT LONG TERM GOAL #3   Title Pt strength to have improved by one grade to allow strength to decrease to no more than a 4/10 80% of the time    Status On-going   PT LONG TERM GOAL #4   Title Pt to be able to stand for 20 minutes to socialize durihg activities    PT LONG TERM GOAL #5   Title Pt to verbalize that his fatigue is 30% improved   Status On-going   PT LONG TERM GOAL #6   Title PT to be walking at least 20 minutes a day  4 days a week for better health habiits.                Plan - 03/16/15 1024    Clinical Impression Statement Pt continues to need verbal and tactile cueing for proper posture while exercising.  Pt continues to be challenged with balance activities.  Able to increase both reps and weight to cybex exercises.   Pt will benefit from skilled therapeutic intervention in order to improve on the following deficits Decreased activity tolerance;Pain;Impaired perceived functional ability;Decreased strength   PT Next Visit Plan begin w-back; x to V and prone exercises.  PT will need reassessment    Consulted and Agree with Plan of Care Patient        Problem List Patient Active Problem List   Diagnosis Date Noted  . B12 deficiency 02/07/2014  . Diverticulosis of colon with hemorrhage 01/01/2014  . Thrombocytopenia, unspecified 01/01/2014  . Acute blood loss anemia 01/01/2014  . Leukopenia 12/31/2013  . Lower GI bleed 12/30/2013  . Diverticulitis 12/30/2013  . Abdominal pain, left lower quadrant 11/16/2013  . Abdominal pain, unspecified site 11/16/2013  . Mantle cell lymphoma 09/07/2013  . Prostate cancer 09/07/2013  . Proctitis, 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  . H/O ulcerative colitis 04/12/2013   . OSA (obstructive sleep apnea) 08/15/2012  . VASOMOTOR RHINITIS 01/23/2010  . GERD 01/23/2010  . Mixed hyperlipidemia 11/23/2009  . HYPERTENSION, BENIGN 11/23/2009  .  Coronary atherosclerosis of native coronary artery 02/18/2009    Rayetta Humphrey, PT CLT [phone]  03/16/2015, 10:27 AM  [facility] [address] [place], [place], [address] Phone: [phone]   Fax:  [phone]

## 2015-03-17 DIAGNOSIS — E1142 Type 2 diabetes mellitus with diabetic polyneuropathy: Secondary | ICD-10-CM | POA: Diagnosis not present

## 2015-03-17 DIAGNOSIS — B351 Tinea unguium: Secondary | ICD-10-CM | POA: Diagnosis not present

## 2015-03-20 ENCOUNTER — Ambulatory Visit (HOSPITAL_COMMUNITY): Payer: Medicare Other | Admitting: Physical Therapy

## 2015-03-20 DIAGNOSIS — R531 Weakness: Secondary | ICD-10-CM

## 2015-03-20 DIAGNOSIS — R52 Pain, unspecified: Secondary | ICD-10-CM

## 2015-03-20 DIAGNOSIS — R5382 Chronic fatigue, unspecified: Secondary | ICD-10-CM | POA: Diagnosis not present

## 2015-03-20 DIAGNOSIS — D61818 Other pancytopenia: Secondary | ICD-10-CM | POA: Diagnosis not present

## 2015-03-20 DIAGNOSIS — T732XXA Exhaustion due to exposure, initial encounter: Secondary | ICD-10-CM

## 2015-03-20 DIAGNOSIS — M6281 Muscle weakness (generalized): Secondary | ICD-10-CM | POA: Diagnosis not present

## 2015-03-20 DIAGNOSIS — R251 Tremor, unspecified: Secondary | ICD-10-CM | POA: Diagnosis not present

## 2015-03-20 NOTE — Therapy (Signed)
[facility] [place], [place], [address] Phone: [phone]   Fax:  [phone]  Physical Therapy Treatment  Patient Details  Name: [name] MRN: [id number] Date of Birth: 1935/02/07 Referring Provider:  [name], MD  Encounter Date: 03/20/2015      PT End of Session - 03/20/15 1203    Visit Number 8   Number of Visits 16   Date for PT Re-Evaluation 04/19/15   Authorization Type medicare   Authorization Time Period g-code completed at visit 8    Authorization - Visit Number 8   Authorization - Number of Visits 18   PT Start Time 0940   PT Stop Time 6759   PT Time Calculation (min) 43 min   Activity Tolerance Patient tolerated treatment well      Past Medical History  Diagnosis Date  . Type 2 diabetes mellitus   . 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 Mixed hyperlipidemia   . PVD (peripheral vascular disease)   . Essential hypertension, benign   . Prostate cancer   . Colon cancer   . B12 deficiency 02/07/2014  . Obstructive sleep apnea     does not use  . GERD (gastroesophageal reflux disease)     Past Surgical History  Procedure Laterality Date  . Tonsillectomy    . Back surgery    . Vasectomy    . Knee surgery Right     total knee  . Shoulder surgery    . Prostatectomy    . Colonoscopy with esophagogastroduodenoscopy (egd) N/A 08/27/2013    Procedure: COLONOSCOPY WITH ESOPHAGOGASTRODUODENOSCOPY (EGD);  Surgeon: Rogene Houston, MD;  [facility];  Service: Endoscopy;  Laterality: N/A;  730  . Balloon dilation N/A 08/27/2013    Procedure: BALLOON DILATION;  Surgeon: Rogene Houston, MD;  [facility];  Service: Endoscopy;  Laterality: N/A;  Venia Minks dilation N/A 08/27/2013    Procedure: Venia Minks DILATION;  Surgeon: Rogene Houston, MD;  [facility];  Service:  Endoscopy;  Laterality: N/A;  . Savory dilation N/A 08/27/2013    Procedure: SAVORY DILATION;  Surgeon: Rogene Houston, MD;  [facility];  Service: Endoscopy;  Laterality: N/A;  . Portacath placement Right 2014  . Colonoscopy N/A 12/17/2013    Procedure: COLONOSCOPY;  Surgeon: Rogene Houston, MD;  [facility];  Service: Endoscopy;  Laterality: N/A;  940  . Colon surgery    . Knee arthroscopy Left   . Removal of port Right   . Colonoscopy N/A 02/28/2014    Procedure: COLONOSCOPY;  Surgeon: Rogene Houston, MD;  [facility];  Service: Endoscopy;  Laterality: N/A;  730    There were no vitals filed for this visit.  Visit Diagnosis:  Fatigue due to exposure, initial encounter  Decreased strength  Pain      Subjective Assessment - 03/20/15 1011    Subjective Pt states he is sore.    Pertinent History DX with mantle cell lymphoma December of 2014 with 6 cycles of chemo.     How long can you sit comfortably? no problem    How long can you stand comfortably? Able to stand 10 minutes without sitting down was 5.    How long can you walk comfortably? able to walk for about 20 minutes was 15  Patient Stated Goals less pain; more strength.     Currently in Pain? Yes   Pain Score 5    Pain Location Shoulder   Pain Orientation Right;Left   Pain Type Chronic pain   Pain Onset More than a month ago            [facility] PT Assessment - 03/20/15 0001    Assessment   Medical Diagnosis fatigue   Hand Dominance Right   Next MD Visit 8/8 2016    Prior Therapy none   Precautions   Precautions None   Observation/Other Assessments   Focus on Therapeutic Outcomes (FOTO)  Vas distress 6 was 8.33    Other Surveys  Other Surveys  FACIT-F 91 was 69; VAS fatigue 5.7 was 9; pain 6.3 was 8;   Functional Tests   Functional tests Single leg stance;Sit to Stand   Single Leg Stance   Comments Lt  38 was 40  Rt 53 was 33.    Sit to Stand   Comments 7 sit to stand  in 30 secons was 4 normal for age is 11-17   Posture/Postural Control   Posture/Postural Control Postural limitations   Postural Limitations Rounded Shoulders;Increased lumbar lordosis;Increased thoracic kyphosis   Posture Comments stands with 10 degree forward bend was 20 degrees    AROM   Overall AROM  Within functional limits for tasks performed   Strength   Right Shoulder Flexion 5/5  was 4/5   Right Shoulder ABduction 5/5  was 4/5   Right Shoulder Internal Rotation 4+/5  was 3+/5   Right Shoulder External Rotation 4/5  was 4-/5   Left Shoulder Flexion 5/5   Left Shoulder Internal Rotation 4+/5  was 3+/5   Left Shoulder External Rotation 5/5   Right Ankle Dorsiflexion 5/5  was 4/5   Left Ankle Dorsiflexion 5/5  was 4-/5   Standardized Balance Assessment   Standardized Balance Assessment Timed Up and Go Test   Timed Up and Go Test   TUG Normal TUG   Normal TUG (seconds) 9.83  9.87            [facility] Adult PT Treatment/Exercise - 03/20/15 0001    Knee/Hip Exercises: Machines for Strengthening   Total Gym Leg Press 3.0 Pl chest press, rows; 6 PL on lat pull down all x 15 reps    Knee/Hip Exercises: Standing   Other Standing Knee Exercises back at wall with B UE flexion x 10 keeping core tight; wall stretch facing wall x 10; wall pushup x 10      UBE forward/ backward x 2:00 each         PT Short Term Goals - 03/20/15 1009    PT SHORT TERM GOAL #1   Time 3   Period Weeks   Status Achieved   PT SHORT TERM GOAL #2   Title Pt to be able to complet 8 sit to stand in 30 seconds to demonstrate improved power   Time 2   Period Weeks   Status On-going   PT SHORT TERM GOAL #3   Title Pt to be able to stand for 10 minutes to be able to make a small meal or carry on a conversation outside    Time 2   Period Weeks   Status Achieved           PT Long Term Goals - 03/20/15 1013    PT LONG TERM GOAL #1   Title I in advance HEP  Time 4   Period Weeks   Status  Achieved   PT LONG TERM GOAL #2   Title Pt to be able to complete 11 Sit to stand to demonstrate normal power for age group.   Time 8   Status On-going   PT LONG TERM GOAL #3   Title Pt strength to have improved by one grade to allow strength to decrease to no more than a 4/10 80% of the time    Time 8   Period Weeks   Status Partially Met  strength is 4+ to 5/5 but pain varies between 0 and 5   PT LONG TERM GOAL #4   Title Pt to be able to stand for 20 minutes to socialize durihg activities    Time 8   Status On-going   PT LONG TERM GOAL #5   Title Pt to verbalize that his fatigue is 30% improved   Time 8   Period Weeks   Status On-going  fatigue is 10% better   PT LONG TERM GOAL #6   Title PT to be walking at least 20 minutes a day  4 days a week for better health habiits.    Time 4   Period Weeks   Status Achieved               Plan - 2015-03-21 1204    Clinical Impression Statement PT reassessed with noted improvement in strength of UE as well as general well being.  Pt and therapist both agree that pt has and will continue to  benefit rom skilled theapy to address his fatigue issues.     Pt will benefit from skilled therapeutic intervention in order to improve on the following deficits Decreased activity tolerance;Pain;Impaired perceived functional ability;Decreased strength   Rehab Potential Good   PT Frequency 2x / week   PT Duration 8 weeks  an additional 4 weeks    PT Treatment/Interventions Neuromuscular re-education;Patient/family education;Stair training;Functional mobility training;Therapeutic activities;Therapeutic exercise   PT Next Visit Plan begin w-back; x to V and prone exercises.           G-Codes - 2015/03/21 11/10/05    Functional Assessment Tool Used FACIT   Functional Limitation Other PT primary   Other PT Primary Current Status (T7711) At least 20 percent but less than 40 percent impaired, limited or restricted   Other PT Primary Goal Status  (A5790) At least 20 percent but less than 40 percent impaired, limited or restricted      Problem List Patient Active Problem List   Diagnosis Date Noted  . B12 deficiency 02/07/2014  . Diverticulosis of colon with hemorrhage 01/01/2014  . Thrombocytopenia, unspecified 01/01/2014  . Acute blood loss anemia 01/01/2014  . Leukopenia 12/31/2013  . Lower GI bleed 12/30/2013  . Diverticulitis 12/30/2013  . Abdominal pain, left lower quadrant 11/16/2013  . Abdominal pain, unspecified site 11/16/2013  . Mantle cell lymphoma 09/07/2013  . Prostate cancer 09/07/2013  . Proctitis, 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  . H/O ulcerative colitis 04/12/2013  . OSA (obstructive sleep apnea) 08/15/2012  . VASOMOTOR RHINITIS 01/23/2010  . GERD 01/23/2010  . Mixed hyperlipidemia 11/23/2009  . HYPERTENSION, BENIGN 11/23/2009  . Coronary atherosclerosis of native coronary artery 02/18/2009    Rayetta Humphrey, PT CLT [phone] 03/21/2015, 12:08 PM  [facility] [address] [place], [place], [address] Phone: [phone]   Fax:  [phone]

## 2015-03-23 ENCOUNTER — Ambulatory Visit (HOSPITAL_COMMUNITY): Payer: Medicare Other | Admitting: Physical Therapy

## 2015-03-28 ENCOUNTER — Ambulatory Visit (HOSPITAL_COMMUNITY): Payer: Medicare Other | Admitting: Physical Therapy

## 2015-03-30 ENCOUNTER — Ambulatory Visit (HOSPITAL_COMMUNITY): Payer: Medicare Other | Attending: Hematology & Oncology | Admitting: Physical Therapy

## 2015-03-30 DIAGNOSIS — T732XXA Exhaustion due to exposure, initial encounter: Secondary | ICD-10-CM

## 2015-03-30 DIAGNOSIS — M6281 Muscle weakness (generalized): Secondary | ICD-10-CM | POA: Insufficient documentation

## 2015-03-30 DIAGNOSIS — R531 Weakness: Secondary | ICD-10-CM

## 2015-03-30 DIAGNOSIS — R52 Pain, unspecified: Secondary | ICD-10-CM

## 2015-03-30 DIAGNOSIS — X58XXXA Exposure to other specified factors, initial encounter: Secondary | ICD-10-CM | POA: Diagnosis not present

## 2015-03-30 NOTE — Therapy (Signed)
[place], [place], [address] Phone: [phone]   Fax:  [phone]  Physical Therapy Treatment  Patient Details  Name: [name] MRN: [id number] Date of Birth: Nov 13, 1934 Referring Provider:  Patrici Ranks, MD  Encounter Date: 03/30/2015      PT End of Session - 03/30/15 1209    Visit Number 9   Number of Visits 16   Date for PT Re-Evaluation 04/19/15   Authorization Type medicare   Authorization Time Period g-code completed at visit 8    Authorization - Visit Number 9   Authorization - Number of Visits 16   PT Start Time 0804   PT Stop Time 6606   PT Time Calculation (min) 51 min      Past Medical History  Diagnosis Date  . Type 2 diabetes mellitus   . 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 Mixed hyperlipidemia   . PVD (peripheral vascular disease)   . Essential hypertension, benign   . Prostate cancer   . Colon cancer   . B12 deficiency 02/07/2014  . Obstructive sleep apnea     does not use  . GERD (gastroesophageal reflux disease)     Past Surgical History  Procedure Laterality Date  . Tonsillectomy    . Back surgery    . Vasectomy    . Knee surgery Right     total knee  . Shoulder surgery    . Prostatectomy    . Colonoscopy with esophagogastroduodenoscopy (egd) N/A 08/27/2013    Procedure: COLONOSCOPY WITH ESOPHAGOGASTRODUODENOSCOPY (EGD);  Surgeon: Rogene Houston, MD;  [facility];  Service: Endoscopy;  Laterality: N/A;  730  . Balloon dilation N/A 08/27/2013    Procedure: BALLOON DILATION;  Surgeon: Rogene Houston, MD;  [facility];  Service: Endoscopy;  Laterality: N/A;  Venia Minks dilation N/A 08/27/2013    Procedure: Venia Minks DILATION;  Surgeon: Rogene Houston, MD;  [facility];  Service: Endoscopy;  Laterality: N/A;  . Savory dilation N/A  08/27/2013    Procedure: SAVORY DILATION;  Surgeon: Rogene Houston, MD;  [facility];  Service: Endoscopy;  Laterality: N/A;  . Portacath placement Right 2014  . Colonoscopy N/A 12/17/2013    Procedure: COLONOSCOPY;  Surgeon: Rogene Houston, MD;  [facility];  Service: Endoscopy;  Laterality: N/A;  940  . Colon surgery    . Knee arthroscopy Left   . Removal of port Right   . Colonoscopy N/A 02/28/2014    Procedure: COLONOSCOPY;  Surgeon: Rogene Houston, MD;  [facility];  Service: Endoscopy;  Laterality: N/A;  730    There were no vitals filed for this visit.  Visit Diagnosis:  Fatigue due to exposure, initial encounter  Decreased strength  Pain      Subjective Assessment - 03/30/15 0807    Subjective Pt states he is doing alright    Pain Score 4    Pain Location Shoulder             [facility] Adult PT Treatment/Exercise - 03/30/15 0001    Balance Poses: Yoga   Warrior I 2 reps;60 seconds   Warrior III 2 reps;30 seconds   Knee/Hip Exercises: Aerobic   Stationary Bike nustep at end 10 minutes UE/LE level 3 hills #4   Knee/Hip Exercises:  Machines for Strengthening   Total Gym Leg Press 3.0 Pl chest press, rows; 6 PL on lat pull down all x 15 reps    Knee/Hip Exercises: Standing   Wall Squat 10 reps   Other Standing Knee Exercises back at wall with B UE flexion x 10 keeping core tight; wall stretch facing wall x 5; wall pushup x 10    Knee/Hip Exercises: Seated   Other Seated Knee/Hip Exercises cybex leg press 6.5pl x 15    Shoulder Exercises: Seated   Other Seated Exercises cybex row and press 2.5 pl each x 15 reps    Other Seated Exercises cybex cable column lat pulldown 6 plates x15 reps   Shoulder Exercises: Prone   Flexion Limitations 10 x  with 3#    Other Prone Exercises rows 3# x 10    Shoulder Exercises: Standing   Flexion Both;10 reps   Shoulder Flexion Weight (lbs) 2   ABduction Both;10 reps;Weights   Shoulder ABduction  Weight (lbs) 2                PT Education - 03/30/15 1208    Education provided Yes   Education Details New prone exercises    Person(s) Educated Patient   Methods Explanation   Comprehension Verbalized understanding          PT Short Term Goals - 03/20/15 1009    PT SHORT TERM GOAL #1   Time 3   Period Weeks   Status Achieved   PT SHORT TERM GOAL #2   Title Pt to be able to complet 8 sit to stand in 30 seconds to demonstrate improved power   Time 2   Period Weeks   Status On-going   PT SHORT TERM GOAL #3   Title Pt to be able to stand for 10 minutes to be able to make a small meal or carry on a conversation outside    Time 2   Period Weeks   Status Achieved           PT Long Term Goals - 03/20/15 1013    PT LONG TERM GOAL #1   Title I in advance HEP   Time 4   Period Weeks   Status Achieved   PT LONG TERM GOAL #2   Title Pt to be able to complete 11 Sit to stand to demonstrate normal power for age group.   Time 8   Status On-going   PT LONG TERM GOAL #3   Title Pt strength to have improved by one grade to allow strength to decrease to no more than a 4/10 80% of the time    Time 8   Period Weeks   Status Partially Met  strength is 4+ to 5/5 but pain varies between 0 and 5   PT LONG TERM GOAL #4   Title Pt to be able to stand for 20 minutes to socialize durihg activities    Time 8   Status On-going   PT LONG TERM GOAL #5   Title Pt to verbalize that his fatigue is 30% improved   Time 8   Period Weeks   Status On-going  fatigue is 10% better   PT LONG TERM GOAL #6   Title PT to be walking at least 20 minutes a day  4 days a week for better health habiits.    Time 4   Period Weeks   Status Achieved  Plan - 03/30/15 1209    Clinical Impression Statement Pt added warrior III as well as prone exercises to strengthen core mm.  Pt demonstrating better postural habits.    PT Next Visit Plan begin prone w-back next visit give  pt handout on prone exercises         Problem List Patient Active Problem List   Diagnosis Date Noted  . B12 deficiency 02/07/2014  . Diverticulosis of colon with hemorrhage 01/01/2014  . Thrombocytopenia, unspecified 01/01/2014  . Acute blood loss anemia 01/01/2014  . Leukopenia 12/31/2013  . Lower GI bleed 12/30/2013  . Diverticulitis 12/30/2013  . Abdominal pain, left lower quadrant 11/16/2013  . Abdominal pain, unspecified site 11/16/2013  . Mantle cell lymphoma 09/07/2013  . Prostate cancer 09/07/2013  . Proctitis, 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  . H/O ulcerative colitis 04/12/2013  . OSA (obstructive sleep apnea) 08/15/2012  . VASOMOTOR RHINITIS 01/23/2010  . GERD 01/23/2010  . Mixed hyperlipidemia 11/23/2009  . HYPERTENSION, BENIGN 11/23/2009  . Coronary atherosclerosis of native coronary artery 02/18/2009    Rayetta Humphrey, PT CLT [phone] 03/30/2015, 12:12 PM  [facility] [address] [place], [place], [address] Phone: [phone]   Fax:  [phone]

## 2015-04-03 ENCOUNTER — Ambulatory Visit (HOSPITAL_COMMUNITY): Payer: Medicare Other | Admitting: Physical Therapy

## 2015-04-03 DIAGNOSIS — T732XXA Exhaustion due to exposure, initial encounter: Secondary | ICD-10-CM

## 2015-04-03 DIAGNOSIS — R531 Weakness: Secondary | ICD-10-CM

## 2015-04-03 DIAGNOSIS — M6281 Muscle weakness (generalized): Secondary | ICD-10-CM | POA: Diagnosis not present

## 2015-04-03 DIAGNOSIS — R52 Pain, unspecified: Secondary | ICD-10-CM

## 2015-04-03 NOTE — Therapy (Signed)
[place], [place], [address] Phone: [phone]   Fax:  [phone]  Physical Therapy Treatment  Patient Details  Name: [name] MRN: [id number] Date of Birth: 1934/11/14 Referring Provider:  Patrici Ranks, MD  Encounter Date: 04/03/2015      PT End of Session - 04/03/15 1617    Visit Number 10   Number of Visits 16   Date for PT Re-Evaluation 04/19/15   Authorization Type medicare   Authorization Time Period g-code completed at visit 8    Authorization - Visit Number 10   Authorization - Number of Visits 16   PT Start Time 1520   PT Stop Time 1608   PT Time Calculation (min) 48 min   Activity Tolerance Patient tolerated treatment well   Behavior During Therapy [facility] for tasks assessed/performed      Past Medical History  Diagnosis Date  . Type 2 diabetes mellitus   . 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 Mixed hyperlipidemia   . PVD (peripheral vascular disease)   . Essential hypertension, benign   . Prostate cancer   . Colon cancer   . B12 deficiency 02/07/2014  . Obstructive sleep apnea     does not use  . GERD (gastroesophageal reflux disease)     Past Surgical History  Procedure Laterality Date  . Tonsillectomy    . Back surgery    . Vasectomy    . Knee surgery Right     total knee  . Shoulder surgery    . Prostatectomy    . Colonoscopy with esophagogastroduodenoscopy (egd) N/A 08/27/2013    Procedure: COLONOSCOPY WITH ESOPHAGOGASTRODUODENOSCOPY (EGD);  Surgeon: Rogene Houston, MD;  [facility];  Service: Endoscopy;  Laterality: N/A;  730  . Balloon dilation N/A 08/27/2013    Procedure: BALLOON DILATION;  Surgeon: Rogene Houston, MD;  [facility];  Service: Endoscopy;  Laterality: N/A;  Venia Minks dilation N/A 08/27/2013    Procedure: Venia Minks DILATION;   Surgeon: Rogene Houston, MD;  [facility];  Service: Endoscopy;  Laterality: N/A;  . Savory dilation N/A 08/27/2013    Procedure: SAVORY DILATION;  Surgeon: Rogene Houston, MD;  [facility];  Service: Endoscopy;  Laterality: N/A;  . Portacath placement Right 2014  . Colonoscopy N/A 12/17/2013    Procedure: COLONOSCOPY;  Surgeon: Rogene Houston, MD;  [facility];  Service: Endoscopy;  Laterality: N/A;  940  . Colon surgery    . Knee arthroscopy Left   . Removal of port Right   . Colonoscopy N/A 02/28/2014    Procedure: COLONOSCOPY;  Surgeon: Rogene Houston, MD;  [facility];  Service: Endoscopy;  Laterality: N/A;  730    There were no vitals filed for this visit.  Visit Diagnosis:  Fatigue due to exposure, initial encounter  Decreased strength  Pain      Subjective Assessment - 04/03/15 1615    Subjective Pt states he is getting stronger.  Reports no pain currently.   Currently in Pain? No/denies                         [facility] Adult PT Treatment/Exercise - 04/03/15 1607    Balance Poses: Yoga   Warrior I 2 reps;60 seconds  Warrior III 2 reps;30 seconds   Knee/Hip Exercises: Aerobic   Stationary Bike nustep at end 10 minutes UE/LE level 3 hills #4   Knee/Hip Exercises: Machines for Strengthening   Total Gym Leg Press 3.0 Pl chest press, rows; 6 PL on lat pull down all x 15 reps    Knee/Hip Exercises: Standing   Other Standing Knee Exercises back at wall with B UE flexion x 10 keeping core tight; wall stretch facing wall x 5; wall pushup x 10    Knee/Hip Exercises: Seated   Other Seated Knee/Hip Exercises cybex leg press 7 pl x 15    Shoulder Exercises: Seated   Other Seated Exercises cybex row 4.5 PL and press 3.5 pl each x 15 reps    Other Seated Exercises cybex cable column lat pulldown 6 plates x15 reps   Shoulder Exercises: Prone   Retraction 10 reps;Weights   Retraction Weight (lbs) 3   Flexion  Limitations 10 x  with 3#    Other Prone Exercises rows 3# x 10    Shoulder Exercises: Standing   Other Standing Exercises Standing with back againt wall, BUE flexion x 10 reps   Shoulder Exercises: ROM/Strengthening   UBE (Upper Arm Bike) 5 minutes forward, 2 minutes backward at level 2                  PT Short Term Goals - 03/20/15 1009    PT SHORT TERM GOAL #1   Time 3   Period Weeks   Status Achieved   PT SHORT TERM GOAL #2   Title Pt to be able to complet 8 sit to stand in 30 seconds to demonstrate improved power   Time 2   Period Weeks   Status On-going   PT SHORT TERM GOAL #3   Title Pt to be able to stand for 10 minutes to be able to make a small meal or carry on a conversation outside    Time 2   Period Weeks   Status Achieved           PT Long Term Goals - 03/20/15 1013    PT LONG TERM GOAL #1   Title I in advance HEP   Time 4   Period Weeks   Status Achieved   PT LONG TERM GOAL #2   Title Pt to be able to complete 11 Sit to stand to demonstrate normal power for age group.   Time 8   Status On-going   PT LONG TERM GOAL #3   Title Pt strength to have improved by one grade to allow strength to decrease to no more than a 4/10 80% of the time    Time 8   Period Weeks   Status Partially Met  strength is 4+ to 5/5 but pain varies between 0 and 5   PT LONG TERM GOAL #4   Title Pt to be able to stand for 20 minutes to socialize durihg activities    Time 8   Status On-going   PT LONG TERM GOAL #5   Title Pt to verbalize that his fatigue is 30% improved   Time 8   Period Weeks   Status On-going  fatigue is 10% better   PT LONG TERM GOAL #6   Title PT to be walking at least 20 minutes a day  4 days a week for better health habiits.    Time 4   Period Weeks   Status Achieved  Plan - 04/03/15 1844    Clinical Impression Statement PT with minimal depth completeing warrior poses, however reports he has been practicing these at  home.  Added prone scapular retraction today with 3# and continued with postural strengthing therex.  Pt able to complete all cybex machines wtihout difiuculty today.    PT Next Visit Plan Progress balance next session wtih addition of balance beam actvitiy.          Problem List Patient Active Problem List   Diagnosis Date Noted  . B12 deficiency 02/07/2014  . Diverticulosis of colon with hemorrhage 01/01/2014  . Thrombocytopenia, unspecified 01/01/2014  . Acute blood loss anemia 01/01/2014  . Leukopenia 12/31/2013  . Lower GI bleed 12/30/2013  . Diverticulitis 12/30/2013  . Abdominal pain, left lower quadrant 11/16/2013  . Abdominal pain, unspecified site 11/16/2013  . Mantle cell lymphoma 09/07/2013  . Prostate cancer 09/07/2013  . Proctitis, 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  . H/O ulcerative colitis 04/12/2013  . OSA (obstructive sleep apnea) 08/15/2012  . VASOMOTOR RHINITIS 01/23/2010  . GERD 01/23/2010  . Mixed hyperlipidemia 11/23/2009  . HYPERTENSION, BENIGN 11/23/2009  . Coronary atherosclerosis of native coronary artery 02/18/2009    Teena Irani, PTA/CLT [phone]  04/03/2015, 6:47 PM  [place], [place], [address] Phone: [phone]   Fax:  [phone]

## 2015-04-06 ENCOUNTER — Telehealth (HOSPITAL_COMMUNITY): Payer: Self-pay

## 2015-04-06 ENCOUNTER — Encounter (HOSPITAL_COMMUNITY): Payer: Medicare Other | Attending: Hematology & Oncology

## 2015-04-06 ENCOUNTER — Ambulatory Visit (HOSPITAL_COMMUNITY): Payer: Medicare Other

## 2015-04-06 VITALS — BP 129/68 | HR 73 | Temp 97.6°F | Resp 18

## 2015-04-06 DIAGNOSIS — C8310 Mantle cell lymphoma, unspecified site: Secondary | ICD-10-CM

## 2015-04-06 DIAGNOSIS — E538 Deficiency of other specified B group vitamins: Secondary | ICD-10-CM

## 2015-04-06 MED ORDER — CYANOCOBALAMIN 1000 MCG/ML IJ SOLN
1000.0000 ug | Freq: Once | INTRAMUSCULAR | Status: AC
  Administered 2015-04-06: 1000 ug via INTRAMUSCULAR

## 2015-04-06 NOTE — Progress Notes (Signed)
[name] presents today for injection per MD orders. B12 1000mcg administered IM in left Upper Arm. Administration without incident. Patient tolerated well.  

## 2015-04-06 NOTE — Patient Instructions (Signed)
[place] at [facility] Discharge Instructions  RECOMMENDATIONS MADE BY THE CONSULTANT AND ANY TEST RESULTS WILL BE SENT TO YOUR REFERRING PHYSICIAN.  Vitamin B12 1000 mcg injection given today as ordered. Continue monthly B12 injection. Return as scheduled.  Thank you for choosing [place] at [facility] to provide your oncology and hematology care.  To afford each patient quality time with our provider, please arrive at least 15 minutes before your scheduled appointment time.    You need to re-schedule your appointment should you arrive 10 or more minutes late.  We strive to give you quality time with our providers, and arriving late affects you and other patients whose appointments are after yours.  Also, if you no show three or more times for appointments you may be dismissed from the clinic at the providers discretion.     Again, thank you for choosing [facility].  Our hope is that these requests will decrease the amount of time that you wait before being seen by our physicians.       _____________________________________________________________  Should you have questions after your visit to [facility], please contact our office at [phone] between the hours of 8:30 a.m. and 4:30 p.m.  Voicemails left after 4:30 p.m. will not be returned until the following business day.  For prescription refill requests, have your pharmacy contact our office.

## 2015-04-06 NOTE — Telephone Encounter (Signed)
No show, called and spoke to pt who stated he had forgotten about today's apt.  Pt informed date and time for next apt.    [address], [place]; [facility] [phone]

## 2015-04-10 ENCOUNTER — Ambulatory Visit (HOSPITAL_COMMUNITY): Payer: Medicare Other | Admitting: Physical Therapy

## 2015-04-10 DIAGNOSIS — M6281 Muscle weakness (generalized): Secondary | ICD-10-CM | POA: Diagnosis not present

## 2015-04-10 DIAGNOSIS — T732XXA Exhaustion due to exposure, initial encounter: Secondary | ICD-10-CM

## 2015-04-10 DIAGNOSIS — R531 Weakness: Secondary | ICD-10-CM

## 2015-04-10 DIAGNOSIS — R52 Pain, unspecified: Secondary | ICD-10-CM

## 2015-04-10 NOTE — Therapy (Signed)
[place], [place], [address] Phone: [phone]   Fax:  [phone]  Physical Therapy Treatment  Patient Details  Name: [name] MRN: [id number] Date of Birth: 1935/06/08 Referring Provider:  Patrici Ranks, MD  Encounter Date: 04/10/2015      PT End of Session - 04/10/15 1551    Visit Number 11   Number of Visits 16   Date for PT Re-Evaluation 04/19/15   Authorization Type medicare   Authorization Time Period g-code completed at visit 8    Authorization - Visit Number 11   Authorization - Number of Visits 16   PT Start Time 5366   PT Stop Time 1600   PT Time Calculation (min) 43 min      Past Medical History  Diagnosis Date  . Type 2 diabetes mellitus   . 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 Mixed hyperlipidemia   . PVD (peripheral vascular disease)   . Essential hypertension, benign   . Prostate cancer   . Colon cancer   . B12 deficiency 02/07/2014  . Obstructive sleep apnea     does not use  . GERD (gastroesophageal reflux disease)     Past Surgical History  Procedure Laterality Date  . Tonsillectomy    . Back surgery    . Vasectomy    . Knee surgery Right     total knee  . Shoulder surgery    . Prostatectomy    . Colonoscopy with esophagogastroduodenoscopy (egd) N/A 08/27/2013    Procedure: COLONOSCOPY WITH ESOPHAGOGASTRODUODENOSCOPY (EGD);  Surgeon: Rogene Houston, MD;  [facility];  Service: Endoscopy;  Laterality: N/A;  730  . Balloon dilation N/A 08/27/2013    Procedure: BALLOON DILATION;  Surgeon: Rogene Houston, MD;  [facility];  Service: Endoscopy;  Laterality: N/A;  Venia Minks dilation N/A 08/27/2013    Procedure: Venia Minks DILATION;  Surgeon: Rogene Houston, MD;  [facility];  Service: Endoscopy;  Laterality: N/A;  . Savory dilation N/A  08/27/2013    Procedure: SAVORY DILATION;  Surgeon: Rogene Houston, MD;  [facility];  Service: Endoscopy;  Laterality: N/A;  . Portacath placement Right 2014  . Colonoscopy N/A 12/17/2013    Procedure: COLONOSCOPY;  Surgeon: Rogene Houston, MD;  [facility];  Service: Endoscopy;  Laterality: N/A;  940  . Colon surgery    . Knee arthroscopy Left   . Removal of port Right   . Colonoscopy N/A 02/28/2014    Procedure: COLONOSCOPY;  Surgeon: Rogene Houston, MD;  [facility];  Service: Endoscopy;  Laterality: N/A;  730    There were no vitals filed for this visit.  Visit Diagnosis:  Fatigue due to exposure, initial encounter  Decreased strength  Pain      Subjective Assessment - 04/10/15 1521    Subjective Patient reports he is still having some shoulder pain today, was worried about needing to cancel appointment since he had to take his wife to colonoscopy    Pertinent History DX with mantle cell lymphoma December of 2014 with 6 cycles of chemo.     Currently in Pain? Yes   Pain Score 5    Pain Location Shoulder   Pain Orientation Right;Left  [facility] Adult PT Treatment/Exercise - 04/10/15 0001    Knee/Hip Exercises: Stretches   Active Hamstring Stretch Both;2 reps;30 seconds   Active Hamstring Stretch Limitations 12 inch box    Piriformis Stretch Both;2 reps;30 seconds   Piriformis Stretch Limitations seated   Gastroc Stretch Both;3 reps;30 seconds   Gastroc Stretch Limitations slantboard    Knee/Hip Exercises: Aerobic   Stationary Bike nustep at end 10 minutes UE/LE level 3 hills #4   Knee/Hip Exercises: Standing   Heel Raises Both;1 set;20 reps   Forward Lunges Both;1 set;10 reps   Forward Lunges Limitations 4" step   Functional Squat 1 set;10 reps   Functional Squat Limitations toes neutral    Other Standing Knee Exercises Dowel golf swings 1x10 each side with 2# dowel for mobility, focus on hip  mobility              Balance Exercises - 04/10/15 1524    Balance Exercises: Standing   Standing Eyes Closed Narrow base of support (BOS);Foam/compliant surface;3 reps;20 secs   Tandem Stance Eyes closed;Foam/compliant surface;3 reps;15 secs   Other Standing Exercises walking balance beam 4x46f            PT Education - 04/10/15 1551    Education provided No          PT Short Term Goals - 03/20/15 1009    PT SHORT TERM GOAL #1   Time 3   Period Weeks   Status Achieved   PT SHORT TERM GOAL #2   Title Pt to be able to complet 8 sit to stand in 30 seconds to demonstrate improved power   Time 2   Period Weeks   Status On-going   PT SHORT TERM GOAL #3   Title Pt to be able to stand for 10 minutes to be able to make a small meal or carry on a conversation outside    Time 2   Period Weeks   Status Achieved           PT Long Term Goals - 03/20/15 1013    PT LONG TERM GOAL #1   Title I in advance HEP   Time 4   Period Weeks   Status Achieved   PT LONG TERM GOAL #2   Title Pt to be able to complete 11 Sit to stand to demonstrate normal power for age group.   Time 8   Status On-going   PT LONG TERM GOAL #3   Title Pt strength to have improved by one grade to allow strength to decrease to no more than a 4/10 80% of the time    Time 8   Period Weeks   Status Partially Met  strength is 4+ to 5/5 but pain varies between 0 and 5   PT LONG TERM GOAL #4   Title Pt to be able to stand for 20 minutes to socialize durihg activities    Time 8   Status On-going   PT LONG TERM GOAL #5   Title Pt to verbalize that his fatigue is 30% improved   Time 8   Period Weeks   Status On-going  fatigue is 10% better   PT LONG TERM GOAL #6   Title PT to be walking at least 20 minutes a day  4 days a week for better health habiits.    Time 4   Period Weeks   Status Achieved  Plan - 04/10/15 1552    Clinical Impression Statement Continued functional  strengthening and stretching with focus on LEs and balance tasks today. Patient demonstrated difficulty with some exercises, especially with eyes closed on foam. Some muscle fatigue noted with functional exercises such as lunges.    Pt will benefit from skilled therapeutic intervention in order to improve on the following deficits Decreased activity tolerance;Pain;Impaired perceived functional ability;Decreased strength   Rehab Potential Good   PT Frequency 2x / week   PT Duration 8 weeks   PT Treatment/Interventions Neuromuscular re-education;Patient/family education;Stair training;Functional mobility training;Therapeutic activities;Therapeutic exercise   PT Next Visit Plan Continue to progress balance exercises, functional strength    PT Home Exercise Plan yes   Consulted and Agree with Plan of Care Patient        Problem List Patient Active Problem List   Diagnosis Date Noted  . B12 deficiency 02/07/2014  . Diverticulosis of colon with hemorrhage 01/01/2014  . Thrombocytopenia, unspecified 01/01/2014  . Acute blood loss anemia 01/01/2014  . Leukopenia 12/31/2013  . Lower GI bleed 12/30/2013  . Diverticulitis 12/30/2013  . Abdominal pain, left lower quadrant 11/16/2013  . Abdominal pain, unspecified site 11/16/2013  . Mantle cell lymphoma 09/07/2013  . Prostate cancer 09/07/2013  . Proctitis, 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  . H/O ulcerative colitis 04/12/2013  . OSA (obstructive sleep apnea) 08/15/2012  . VASOMOTOR RHINITIS 01/23/2010  . GERD 01/23/2010  . Mixed hyperlipidemia 11/23/2009  . HYPERTENSION, BENIGN 11/23/2009  . Coronary atherosclerosis of native coronary artery 02/18/2009    Deniece Ree PT, DPT [place] [address] [place], [place], [address] Phone: [phone]   Fax:  [phone]

## 2015-04-13 ENCOUNTER — Ambulatory Visit (HOSPITAL_COMMUNITY): Payer: Medicare Other

## 2015-04-13 DIAGNOSIS — R531 Weakness: Secondary | ICD-10-CM

## 2015-04-13 DIAGNOSIS — T732XXA Exhaustion due to exposure, initial encounter: Secondary | ICD-10-CM

## 2015-04-13 DIAGNOSIS — M6281 Muscle weakness (generalized): Secondary | ICD-10-CM | POA: Diagnosis not present

## 2015-04-13 DIAGNOSIS — R52 Pain, unspecified: Secondary | ICD-10-CM | POA: Diagnosis not present

## 2015-04-13 NOTE — Therapy (Signed)
[place], [place], [address] Phone: [phone]   Fax:  [phone]  Physical Therapy Treatment  Patient Details  Name: [name] MRN: [id number] Date of Birth: Jan 20, 1935 Referring Analeia Ismael:  Patrici Ranks, MD  Encounter Date: 04/13/2015      PT End of Session - 04/13/15 0806    Visit Number 12   Number of Visits 16   Date for PT Re-Evaluation 04/19/15   Authorization Type medicare   Authorization Time Period g-code completed at visit 8    Authorization - Visit Number 12   Authorization - Number of Visits 16   PT Start Time 0800   PT Stop Time 0850   PT Time Calculation (min) 50 min   Activity Tolerance Patient tolerated treatment well   Behavior During Therapy [facility] for tasks assessed/performed      Past Medical History  Diagnosis Date  . Type 2 diabetes mellitus   . 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 Mixed hyperlipidemia   . PVD (peripheral vascular disease)   . Essential hypertension, benign   . Prostate cancer   . Colon cancer   . B12 deficiency 02/07/2014  . Obstructive sleep apnea     does not use  . GERD (gastroesophageal reflux disease)     Past Surgical History  Procedure Laterality Date  . Tonsillectomy    . Back surgery    . Vasectomy    . Knee surgery Right     total knee  . Shoulder surgery    . Prostatectomy    . Colonoscopy with esophagogastroduodenoscopy (egd) N/A 08/27/2013    Procedure: COLONOSCOPY WITH ESOPHAGOGASTRODUODENOSCOPY (EGD);  Surgeon: Rogene Houston, MD;  [facility];  Service: Endoscopy;  Laterality: N/A;  730  . Balloon dilation N/A 08/27/2013    Procedure: BALLOON DILATION;  Surgeon: Rogene Houston, MD;  [facility];  Service: Endoscopy;  Laterality: N/A;  Venia Minks dilation N/A 08/27/2013    Procedure: Venia Minks DILATION;   Surgeon: Rogene Houston, MD;  [facility];  Service: Endoscopy;  Laterality: N/A;  . Savory dilation N/A 08/27/2013    Procedure: SAVORY DILATION;  Surgeon: Rogene Houston, MD;  [facility];  Service: Endoscopy;  Laterality: N/A;  . Portacath placement Right 2014  . Colonoscopy N/A 12/17/2013    Procedure: COLONOSCOPY;  Surgeon: Rogene Houston, MD;  [facility];  Service: Endoscopy;  Laterality: N/A;  940  . Colon surgery    . Knee arthroscopy Left   . Removal of port Right   . Colonoscopy N/A 02/28/2014    Procedure: COLONOSCOPY;  Surgeon: Rogene Houston, MD;  [facility];  Service: Endoscopy;  Laterality: N/A;  730    There were no vitals filed for this visit.  Visit Diagnosis:  Fatigue due to exposure, initial encounter  Decreased strength  Pain      Subjective Assessment - 04/13/15 0803    Subjective Pt stated he has pain in Bil shoulders today.  Pt stated yesterday was able to walk 4,000 steps.   Currently in Pain? Yes   Pain Score 5    Pain Location Shoulder   Pain Orientation Right;Left            [facility] PT Assessment - 04/13/15 0001    Assessment  Medical Diagnosis fatigue   Next MD Visit 8/8 2016    Prior Therapy none   Precautions   Precautions None             [facility] Adult PT Treatment/Exercise - 04/13/15 0001    Exercises   Exercises Knee/Hip   Knee/Hip Exercises: Aerobic   Stationary Bike nustep at end 10 minutes UE/LE level 3 hills #4   Knee/Hip Exercises: Standing   Heel Raises Both;1 set;20 reps   Forward Lunges Both;1 set;10 reps   Forward Lunges Limitations 4" step   Side Lunges Both;10 reps   Side Lunges Limitations 4in step   Functional Squat 1 set;10 reps   Functional Squat Limitations toes neutral    SLS Rt 24, Lt 18 max of 3             Balance Exercises - 04/13/15 0844    Balance Exercises: Standing   Tandem Stance Eyes open;Foam/compliant surface;3 reps;30 secs   Balance  Beam Tandem and sidestepping 2RT             PT Short Term Goals - 04/13/15 0809    PT SHORT TERM GOAL #1   Title I HEP   Status Achieved   PT SHORT TERM GOAL #2   Title Pt to be able to complet 8 sit to stand in 30 seconds to demonstrate improved power   Status On-going   PT SHORT TERM GOAL #3   Title Pt to be able to stand for 10 minutes to be able to make a small meal or carry on a conversation outside    Status Achieved           PT Long Term Goals - 04/13/15 0809    PT LONG TERM GOAL #1   Title I in advance HEP   Status Achieved   PT LONG TERM GOAL #2   Title Pt to be able to complete 11 Sit to stand to demonstrate normal power for age group.   PT LONG TERM GOAL #3   Title Pt strength to have improved by one grade to allow strength to decrease to no more than a 4/10 80% of the time    Status On-going   PT LONG TERM GOAL #4   Title Pt to be able to stand for 20 minutes to socialize durihg activities    Status On-going   PT LONG TERM GOAL #5   Title Pt to verbalize that his fatigue is 30% improved   Status On-going   PT LONG TERM GOAL #6   Title PT to be walking at least 20 minutes a day  4 days a week for better health habiits.    Status Achieved               Plan - 04/13/15 0839    Clinical Impression Statement Session focus on functional strengthening and balance activities.  Therapist facilitation required for proper form with some exercises, added side lunges for glut med strengthening.  Pt improving balance, able to demonstrate tandem gait with min guard, able to recover LOB episodes independently.  Noted visible musculature fatigue with funcitonal strengthening exercies, no reports of pain through session.     PT Next Visit Plan Continue to progress balance exercises, functional strength         Problem List Patient Active Problem List   Diagnosis Date Noted  . B12 deficiency 02/07/2014  . Diverticulosis of colon with hemorrhage 01/01/2014   . Thrombocytopenia, unspecified 01/01/2014  .  Acute blood loss anemia 01/01/2014  . Leukopenia 12/31/2013  . Lower GI bleed 12/30/2013  . Diverticulitis 12/30/2013  . Abdominal pain, left lower quadrant 11/16/2013  . Abdominal pain, unspecified site 11/16/2013  . Mantle cell lymphoma 09/07/2013  . Prostate cancer 09/07/2013  . Proctitis, 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  . H/O ulcerative colitis 04/12/2013  . OSA (obstructive sleep apnea) 08/15/2012  . VASOMOTOR RHINITIS 01/23/2010  . GERD 01/23/2010  . Mixed hyperlipidemia 11/23/2009  . HYPERTENSION, BENIGN 11/23/2009  . Coronary atherosclerosis of native coronary artery 02/18/2009   Ihor Austin, LPTA; [place]  Aldona Lento 04/13/2015, 8:46 AM  [place], [place], [address] Phone: [phone]   Fax:  [phone]

## 2015-04-17 ENCOUNTER — Ambulatory Visit (HOSPITAL_COMMUNITY): Payer: Medicare Other | Admitting: Physical Therapy

## 2015-04-17 DIAGNOSIS — M159 Polyosteoarthritis, unspecified: Secondary | ICD-10-CM | POA: Diagnosis not present

## 2015-04-17 DIAGNOSIS — G20 Parkinson's disease: Secondary | ICD-10-CM | POA: Diagnosis not present

## 2015-04-17 DIAGNOSIS — R2681 Unsteadiness on feet: Secondary | ICD-10-CM | POA: Diagnosis not present

## 2015-04-17 DIAGNOSIS — C8310 Mantle cell lymphoma, unspecified site: Secondary | ICD-10-CM | POA: Diagnosis not present

## 2015-04-19 ENCOUNTER — Ambulatory Visit (HOSPITAL_COMMUNITY): Payer: Medicare Other

## 2015-04-20 ENCOUNTER — Ambulatory Visit (HOSPITAL_COMMUNITY): Payer: Medicare Other

## 2015-04-20 DIAGNOSIS — R52 Pain, unspecified: Secondary | ICD-10-CM | POA: Diagnosis not present

## 2015-04-20 DIAGNOSIS — T732XXA Exhaustion due to exposure, initial encounter: Secondary | ICD-10-CM | POA: Diagnosis not present

## 2015-04-20 DIAGNOSIS — M6281 Muscle weakness (generalized): Secondary | ICD-10-CM | POA: Diagnosis not present

## 2015-04-20 DIAGNOSIS — R531 Weakness: Secondary | ICD-10-CM

## 2015-04-20 NOTE — Patient Instructions (Addendum)
SINGLE LIMB STANCE   Stance: single leg on floor. Raise leg. Hold 30-60 seconds. Repeat with other leg. 3 reps per set, 2 sets per day.  Copyright  VHI. All rights reserved.   Tandem Stance   Right foot in front of left, heel touching toe both feet "straight ahead". Stand on Foot Triangle of Support with both feet. Balance in this position 30-60 seconds. Do with left foot in front of right.  Copyright  VHI. All rights reserved.   Feet Heel-Toe "Tandem"   Arms outstretched, walk a straight line bringing one foot directly in front of the other. Do 2 sessions per day.  Copyright  VHI. All rights reserved. [name]

## 2015-04-20 NOTE — Therapy (Signed)
[place], [place], [address] Phone: [phone]   Fax:  [phone]  Physical Therapy Treatment  Patient Details  Name: [name] MRN: [id number] Date of Birth: 1935-05-04 Referring Provider:  Patrici Ranks, MD  Encounter Date: 04/20/2015      PT End of Session - 04/20/15 1612    Visit Number 13   Number of Visits 16   Authorization Type medicare   Authorization Time Period g-code completed at visit 8    Authorization - Visit Number 13   Authorization - Number of Visits 16   PT Start Time 3785   PT Stop Time 1652   PT Time Calculation (min) 44 min   Activity Tolerance Patient tolerated treatment well   Behavior During Therapy [facility] for tasks assessed/performed      Past Medical History  Diagnosis Date  . Type 2 diabetes mellitus   . 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 Mixed hyperlipidemia   . PVD (peripheral vascular disease)   . Essential hypertension, benign   . Prostate cancer   . Colon cancer   . B12 deficiency 02/07/2014  . Obstructive sleep apnea     does not use  . GERD (gastroesophageal reflux disease)     Past Surgical History  Procedure Laterality Date  . Tonsillectomy    . Back surgery    . Vasectomy    . Knee surgery Right     total knee  . Shoulder surgery    . Prostatectomy    . Colonoscopy with esophagogastroduodenoscopy (egd) N/A 08/27/2013    Procedure: COLONOSCOPY WITH ESOPHAGOGASTRODUODENOSCOPY (EGD);  Surgeon: Rogene Houston, MD;  [facility];  Service: Endoscopy;  Laterality: N/A;  730  . Balloon dilation N/A 08/27/2013    Procedure: BALLOON DILATION;  Surgeon: Rogene Houston, MD;  [facility];  Service: Endoscopy;  Laterality: N/A;  Venia Minks dilation N/A 08/27/2013    Procedure: Venia Minks DILATION;  Surgeon: Rogene Houston, MD;  [facility];  Service: Endoscopy;  Laterality: N/A;  . Savory dilation N/A 08/27/2013    Procedure: SAVORY DILATION;  Surgeon: Rogene Houston, MD;  [facility];  Service: Endoscopy;  Laterality: N/A;  . Portacath placement Right 2014  . Colonoscopy N/A 12/17/2013    Procedure: COLONOSCOPY;  Surgeon: Rogene Houston, MD;  [facility];  Service: Endoscopy;  Laterality: N/A;  940  . Colon surgery    . Knee arthroscopy Left   . Removal of port Right   . Colonoscopy N/A 02/28/2014    Procedure: COLONOSCOPY;  Surgeon: Rogene Houston, MD;  [facility];  Service: Endoscopy;  Laterality: N/A;  730    There were no vitals filed for this visit.  Visit Diagnosis:  Fatigue due to exposure, initial encounter  Decreased strength  Pain      Subjective Assessment - 04/20/15 1610    Subjective Pt feels his strength has made improvements through therapy, and balance has improved a little bit.  Has been documenting steps daily, average steps per day 2500-3000, usually does 4000+ on Wednesdays   How long can you sit comfortably? no problem    How long can you stand comfortably? Able to stand for 20 minutes prior fatigue (Able to stand 10 minutes without sitting down)   How  long can you walk comfortably? Able to walk 1 mile prior fatigue.  (able to walk for about 20 minutes)   Currently in Pain? No/denies      PT Facit-t currently 117.75 was 68.6; VAS pain was 8 now 3; Distress was 8.33 now 3; fatigue was 9 now 3;  TUG 8.35 was 9.87               Balance Exercises - 05-13-2015 1908    Balance Exercises: Standing   Tandem Stance Eyes open;Foam/compliant surface;3 reps;30 secs   Balance Beam Tandem and sidestepping 2RT   Retro Gait 2 reps             PT Short Term Goals - May 13, 2015 1613    PT SHORT TERM GOAL #1   Title I HEP   Baseline 05-13-15 Reports compliance with HEP daily   Status Achieved   PT SHORT TERM GOAL #2   Title Pt to be able to  complet 8 sit to stand in 30 seconds to demonstrate improved power   Baseline 05/13/2015 8 STS in 29"   Status Achieved   PT SHORT TERM GOAL #3   Title Pt to be able to stand for 10 minutes to be able to make a small meal or carry on a conversation outside    Baseline 05-13-2015 Able to stand 20 minutes   Status Achieved           PT Long Term Goals - 05-13-15 1615    PT LONG TERM GOAL #1   Title I in advance HEP   Status Achieved   PT LONG TERM GOAL #2   Title Pt to be able to complete 11 Sit to stand to demonstrate normal power for age group.   Status On-going   PT LONG TERM GOAL #3   Title Pt strength to have improved by one grade to allow strength to decrease to no more than a 4/10 80% of the time    Status Achieved   PT LONG TERM GOAL #4   Title Pt to be able to stand for 20 minutes to socialize durihg activities    Status Achieved   PT LONG TERM GOAL #5   Title Pt to verbalize that his fatigue is 30% improved   Baseline May 13, 2015 Verbalized that fatigue is 89-90% improved   Status Achieved   PT LONG TERM GOAL #6   Title PT to be walking at least 20 minutes a day  4 days a week for better health habiits.    Status Achieved               Plan - May 13, 2015 1717    Clinical Impression Statement Reassessment complete with the following findings.  Pt reports compliance with HEP daily.  Improved tolerance for standing and walking, continues to walk with pedometer daily.  Pt demosntrated improved strength with abiltiy to complete 8 STS in 29 seconds, 11 STS in 34 seconds.  Pt verbalized that his fatigue has improved 80-90%.  Pt feels his balance is the most difficutly he has, pt given HEP printout to add balance activities at home.  Discussion held about activities to complete following D/C including balance and strenghtening activities and encouraged to increase steps per day to improve activity tolerance.   PT Next Visit Plan Recommend D/C to HEP per all goals met.           G-Codes - 05/13/2015 1200/12/13    Functional Assessment Tool Used FACIT  Functional Limitation Other PT primary   Other PT Primary Goal Status (V2224) At least 20 percent but less than 40 percent impaired, limited or restricted   Other PT Primary Discharge Status [phone]) At least 20 percent but less than 40 percent impaired, limited or restricted      Problem List Patient Active Problem List   Diagnosis Date Noted  . B12 deficiency 02/07/2014  . Diverticulosis of colon with hemorrhage 01/01/2014  . Thrombocytopenia, unspecified 01/01/2014  . Acute blood loss anemia 01/01/2014  . Leukopenia 12/31/2013  . Lower GI bleed 12/30/2013  . Diverticulitis 12/30/2013  . Abdominal pain, left lower quadrant 11/16/2013  . Abdominal pain, unspecified site 11/16/2013  . Mantle cell lymphoma 09/07/2013  . Prostate cancer 09/07/2013  . Proctitis, 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  . H/O ulcerative colitis 04/12/2013  . OSA (obstructive sleep apnea) 08/15/2012  . VASOMOTOR RHINITIS 01/23/2010  . GERD 01/23/2010  . Mixed hyperlipidemia 11/23/2009  . HYPERTENSION, BENIGN 11/23/2009  . Coronary atherosclerosis of native coronary artery 02/18/2009   Ihor Austin, [place]; [place] Rayetta Humphrey, PT CLT [phone]  04/21/2015, 12:03 PM  [place], [place], [address] Phone: [phone]   Fax:  [phone]  PHYSICAL THERAPY DISCHARGE SUMMARY  Visits from Start of Care: 13  Current functional level related to goals / functional outcomes: All goals met    Remaining deficits: None    Education / Equipment: Continue to increase steps per day and strengthening  Plan: Patient agrees to discharge.  Patient goals were met. Patient is being discharged due to being pleased with the current functional level.  ?????   Rayetta Humphrey, [place] CLT [phone]

## 2015-04-21 ENCOUNTER — Ambulatory Visit (HOSPITAL_COMMUNITY): Payer: Medicare Other

## 2015-04-21 DIAGNOSIS — E119 Type 2 diabetes mellitus without complications: Secondary | ICD-10-CM | POA: Diagnosis not present

## 2015-04-21 DIAGNOSIS — E782 Mixed hyperlipidemia: Secondary | ICD-10-CM | POA: Diagnosis not present

## 2015-04-24 ENCOUNTER — Ambulatory Visit (HOSPITAL_COMMUNITY): Payer: Medicare Other | Admitting: Physical Therapy

## 2015-04-24 DIAGNOSIS — D696 Thrombocytopenia, unspecified: Secondary | ICD-10-CM | POA: Diagnosis not present

## 2015-04-24 DIAGNOSIS — E119 Type 2 diabetes mellitus without complications: Secondary | ICD-10-CM | POA: Diagnosis not present

## 2015-04-24 DIAGNOSIS — K210 Gastro-esophageal reflux disease with esophagitis: Secondary | ICD-10-CM | POA: Diagnosis not present

## 2015-04-24 DIAGNOSIS — I10 Essential (primary) hypertension: Secondary | ICD-10-CM | POA: Diagnosis not present

## 2015-05-01 ENCOUNTER — Encounter (HOSPITAL_COMMUNITY): Payer: Self-pay | Admitting: Hematology & Oncology

## 2015-05-01 ENCOUNTER — Encounter (HOSPITAL_COMMUNITY): Payer: Medicare Other

## 2015-05-01 ENCOUNTER — Encounter (HOSPITAL_COMMUNITY): Payer: Medicare Other | Attending: Hematology and Oncology

## 2015-05-01 ENCOUNTER — Encounter (HOSPITAL_BASED_OUTPATIENT_CLINIC_OR_DEPARTMENT_OTHER): Payer: Medicare Other | Admitting: Hematology & Oncology

## 2015-05-01 VITALS — BP 128/75 | HR 66 | Temp 97.8°F | Resp 18

## 2015-05-01 VITALS — BP 127/84 | HR 68 | Temp 97.4°F | Resp 16 | Wt 232.6 lb

## 2015-05-01 DIAGNOSIS — R5382 Chronic fatigue, unspecified: Secondary | ICD-10-CM

## 2015-05-01 DIAGNOSIS — Z5112 Encounter for antineoplastic immunotherapy: Secondary | ICD-10-CM

## 2015-05-01 DIAGNOSIS — C8310 Mantle cell lymphoma, unspecified site: Secondary | ICD-10-CM

## 2015-05-01 DIAGNOSIS — E538 Deficiency of other specified B group vitamins: Secondary | ICD-10-CM | POA: Diagnosis not present

## 2015-05-01 DIAGNOSIS — D61818 Other pancytopenia: Secondary | ICD-10-CM

## 2015-05-01 DIAGNOSIS — E119 Type 2 diabetes mellitus without complications: Secondary | ICD-10-CM | POA: Diagnosis not present

## 2015-05-01 DIAGNOSIS — R251 Tremor, unspecified: Secondary | ICD-10-CM

## 2015-05-01 LAB — COMPREHENSIVE METABOLIC PANEL
ALT: 18 U/L (ref 17–63)
AST: 22 U/L (ref 15–41)
Albumin: 4.2 g/dL (ref 3.5–5.0)
Alkaline Phosphatase: 43 U/L (ref 38–126)
Anion gap: 10 (ref 5–15)
BILIRUBIN TOTAL: 0.6 mg/dL (ref 0.3–1.2)
BUN: 32 mg/dL — AB (ref 6–20)
CO2: 26 mmol/L (ref 22–32)
Calcium: 8.9 mg/dL (ref 8.9–10.3)
Chloride: 106 mmol/L (ref 101–111)
Creatinine, Ser: 1.42 mg/dL — ABNORMAL HIGH (ref 0.61–1.24)
GFR calc Af Amer: 53 mL/min — ABNORMAL LOW (ref >60)
GFR calc non Af Amer: 46 mL/min — ABNORMAL LOW (ref >60)
Glucose, Bld: 136 mg/dL — ABNORMAL HIGH (ref 65–99)
POTASSIUM: 4.2 mmol/L (ref 3.5–5.1)
Sodium: 142 mmol/L (ref 135–145)
Total Protein: 6.6 g/dL (ref 6.5–8.1)

## 2015-05-01 MED ORDER — CYANOCOBALAMIN 1000 MCG/ML IJ SOLN
1000.0000 ug | Freq: Once | INTRAMUSCULAR | Status: AC
  Administered 2015-05-01: 1000 ug via INTRAMUSCULAR
  Filled 2015-05-01: qty 1

## 2015-05-01 MED ORDER — HEPARIN SOD (PORK) LOCK FLUSH 100 UNIT/ML IV SOLN
500.0000 [IU] | Freq: Once | INTRAVENOUS | Status: AC | PRN
  Administered 2015-05-01: 500 [IU]
  Filled 2015-05-01: qty 5

## 2015-05-01 MED ORDER — ACETAMINOPHEN 325 MG PO TABS
650.0000 mg | ORAL_TABLET | Freq: Once | ORAL | Status: AC
  Administered 2015-05-01: 650 mg via ORAL
  Filled 2015-05-01: qty 2

## 2015-05-01 MED ORDER — SODIUM CHLORIDE 0.9 % IJ SOLN
10.0000 mL | INTRAMUSCULAR | Status: DC | PRN
  Administered 2015-05-01: 10 mL
  Filled 2015-05-01: qty 10

## 2015-05-01 MED ORDER — SODIUM CHLORIDE 0.9 % IV SOLN
Freq: Once | INTRAVENOUS | Status: AC
  Administered 2015-05-01: 10:00:00 via INTRAVENOUS

## 2015-05-01 MED ORDER — RITUXIMAB CHEMO INJECTION 500 MG/50ML
500.0000 mg/m2 | Freq: Once | INTRAVENOUS | Status: AC
  Administered 2015-05-01: 1100 mg via INTRAVENOUS
  Filled 2015-05-01: qty 100

## 2015-05-01 MED ORDER — DIPHENHYDRAMINE HCL 25 MG PO CAPS
50.0000 mg | ORAL_CAPSULE | Freq: Once | ORAL | Status: AC
  Administered 2015-05-01: 50 mg via ORAL
  Filled 2015-05-01: qty 2

## 2015-05-01 NOTE — Patient Instructions (Signed)
[place] at [facility] Discharge Instructions  RECOMMENDATIONS MADE BY THE CONSULTANT AND ANY TEST RESULTS WILL BE SENT TO YOUR REFERRING PHYSICIAN.  1.  Return as scheduled for Rituxan every 60 days; Vit B12 injections monthly, and an office visit in 2 months. 2.  Please call our office sooner if you have questions, concerns, worsening symptoms as discussed.  Thank you for choosing [place] at [facility] to provide your oncology and hematology care.  To afford each patient quality time with our provider, please arrive at least 15 minutes before your scheduled appointment time.    You need to re-schedule your appointment should you arrive 10 or more minutes late.  We strive to give you quality time with our providers, and arriving late affects you and other patients whose appointments are after yours.  Also, if you no show three or more times for appointments you may be dismissed from the clinic at the providers discretion.     Again, thank you for choosing [facility].  Our hope is that these requests will decrease the amount of time that you wait before being seen by our physicians.       _____________________________________________________________  Should you have questions after your visit to [facility], please contact our office at [phone] between the hours of 8:30 a.m. and 4:30 p.m.  Voicemails left after 4:30 p.m. will not be returned until the following business day.  For prescription refill requests, have your pharmacy contact our office.

## 2015-05-01 NOTE — Progress Notes (Signed)
See chemo encounter.

## 2015-05-01 NOTE — Progress Notes (Signed)
Brandon Cahill, MD  [place] [address]    DIAGNOSIS: Mantle Cell lymphoma of the large intestine diagnosed 08/2013 Colonoscopy on 02/28/2014 no evidence of disease   SUMMARY OF ONCOLOGIC HISTORY:   Mantle cell lymphoma   08/27/2013 Initial Diagnosis Colon, biopsy, random - MANTLE CELL LYMPHOMA   09/06/2013 Imaging CT CAP- No significant lymphadenopathy identified within the chest, abdomen or pelvis.   09/14/2013 - 02/07/2014 Chemotherapy Bendamustine/Rituxan x 6 cycles with Neulasta support   05/10/2014 -  Chemotherapy maintenance Rituxan 500 mg/m cycle 1    CURRENT THERAPY: Rituxan   INTERVAL HISTORY: [name] 80 y.o. male returns for follow-up of his mantle cell lymphoma.  He is doing well. He has gained back all of his weight and because of his diabetes that frustrates him some.  He denies abdominal pain, nausea, night sweats, or fever.  He is here alone today  Since beginning treatment, his appetite for certain things has decreased, noting "that eggs just don't taste right anymore". He says he is "eating enough to keep going" and reports weight gain. Though he does not feel good about the weight gain. He works every Wednesday in [place] for 7-8 hours and walks regularly.  He denies blood in stool.  He went to the neurologist. He was given pills by the neurologist which he feels did not help with his tremors. He went back and was adjusted to taking the pills two at a time. He says the nurse mentioned Parkinson's disease, he is not sure this is what he has as he does not recall his "doctor" mentioning it. He is unsure when his next colonoscopy is scheduled.  He has copies of his laboratory studies from Dr. Juel Burrow office. Kidney function is slightly worse. Other than his new medication from his neurologist he denies any changes in his current medication regimen.  MEDICAL HISTORY: Past Medical History  Diagnosis Date  . Type 2 diabetes mellitus   .  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 Mixed hyperlipidemia   . PVD (peripheral vascular disease)   . Essential hypertension, benign   . Prostate cancer   . Colon cancer   . B12 deficiency 02/07/2014  . Obstructive sleep apnea     does not use  . GERD (gastroesophageal reflux disease)     has Mixed hyperlipidemia; HYPERTENSION, BENIGN; Coronary atherosclerosis of native coronary artery; VASOMOTOR RHINITIS; GERD; OSA (obstructive sleep apnea); H/O ulcerative colitis; Mantle cell lymphoma; Prostate cancer; Proctitis, radiation; Abdominal pain, left lower quadrant; Abdominal pain, unspecified site; Lower GI bleed; Diverticulitis; Leukopenia; Diverticulosis of colon with hemorrhage; Thrombocytopenia, unspecified; Acute blood loss anemia; and B12 deficiency on his problem list.     is allergic to bee venom; adhesive; ciprofloxacin; codeine; iodine; latex; povidone-iodine; and simvastatin.  Mr. Czajka does not currently have medications on file.  SURGICAL HISTORY: Past Surgical History  Procedure Laterality Date  . Tonsillectomy    . Back surgery    . Vasectomy    . Knee surgery Right     total knee  . Shoulder surgery    . Prostatectomy    . Colonoscopy with esophagogastroduodenoscopy (egd) N/A 08/27/2013    Procedure: COLONOSCOPY WITH ESOPHAGOGASTRODUODENOSCOPY (EGD);  Surgeon: Rogene Houston, MD;  [facility];  Service: Endoscopy;  Laterality: N/A;  730  . Balloon dilation N/A 08/27/2013  Procedure: BALLOON DILATION;  Surgeon: Rogene Houston, MD;  [facility];  Service: Endoscopy;  Laterality: N/A;  Venia Minks dilation N/A 08/27/2013    Procedure: Venia Minks DILATION;  Surgeon: Rogene Houston, MD;  [facility];  Service: Endoscopy;  Laterality: N/A;  . Savory dilation N/A 08/27/2013    Procedure: SAVORY DILATION;  Surgeon: Rogene Houston, MD;  [facility];  Service: Endoscopy;  Laterality: N/A;  . Portacath placement Right 2014  . Colonoscopy N/A 12/17/2013    Procedure: COLONOSCOPY;  Surgeon: Rogene Houston, MD;  [facility];  Service: Endoscopy;  Laterality: N/A;  940  . Colon surgery    . Knee arthroscopy Left   . Removal of port Right   . Colonoscopy N/A 02/28/2014    Procedure: COLONOSCOPY;  Surgeon: Rogene Houston, MD;  [facility];  Service: Endoscopy;  Laterality: N/A;  730    SOCIAL HISTORY: History   Social History  . Marital Status: Married    Spouse Name: N/A  . Number of Children: N/A  . Years of Education: N/A   Occupational History  . retired    Social History Main Topics  . Smoking status: Never Smoker   . Smokeless tobacco: Never Used  . Alcohol Use: No  . Drug Use: No  . Sexual Activity: Yes    Birth Control/ Protection: None   Other Topics Concern  . Not on file   Social History Narrative    FAMILY HISTORY: Family History  Problem Relation Age of Onset  . Colon cancer Brother   . Cancer Brother   . Diabetes Father   . Cancer Brother     Review of Systems  Constitutional: Positive for appetite loss and weight gain.  HENT: Negative.   Eyes: Negative.   Respiratory: Negative.       Chronic cough. Cardiovascular: Negative.   Gastrointestinal: Negative.   Genitourinary: Negative.   Musculoskeletal: Negative.   Skin: Negative.   Neurological: Positive for tremors. Negative for dizziness, tingling, sensory change, speech change, focal weakness, seizures and loss of consciousness.      Tremor in hands and arms. Endo/Heme/Allergies: Negative.   Psychiatric/Behavioral: Negative.     PHYSICAL EXAMINATION  ECOG PERFORMANCE STATUS: 1 - Symptomatic but completely ambulatory  Filed Vitals:   05/01/15 0855  BP: 127/84  Pulse: 68  Temp: 97.4 F (36.3 C)  Resp: 16    Physical Exam  Constitutional: He is oriented to person, place, and time and  well-developed, well-nourished, and in no distress.  HENT:  Head: Normocephalic and atraumatic.  Nose: Nose normal.  Mouth/Throat: Oropharynx is clear and moist. No oropharyngeal exudate.  Eyes: Conjunctivae and EOM are normal. Pupils are equal, round, and reactive to light. Right eye exhibits no discharge. Left eye exhibits no discharge. No scleral icterus.  Neck: Normal range of motion. Neck supple. No tracheal deviation present. No thyromegaly present.  Cardiovascular: Normal rate, regular rhythm and normal heart sounds.  Exam reveals no gallop and no friction rub.   No murmur heard. Pulmonary/Chest: Effort normal and breath sounds normal. He has no wheezes. He has no rales.  Abdominal: Soft. Bowel sounds are normal. He exhibits no distension and no mass. There is no tenderness. There is no rebound and no guarding.  Musculoskeletal: Normal range of motion. He exhibits no edema.  Lymphadenopathy:       Head (right side): No submental and no submandibular adenopathy present.  Head (left side): No submental and no submandibular adenopathy present.    He has no cervical adenopathy.       Right axillary: No pectoral and no lateral adenopathy present.       Left axillary: No pectoral and no lateral adenopathy present.      Right: No inguinal and no supraclavicular adenopathy present.       Left: No inguinal and no supraclavicular adenopathy present.  Neurological: He is alert and oriented to person, place, and time. He has normal reflexes. No cranial nerve deficit. Gait normal. Coordination normal.  Skin: Skin is warm and dry. No rash noted.  Psychiatric: Mood, memory, affect and judgment normal.  Nursing note and vitals reviewed.   LABORATORY DATA:  CBC    Component Value Date/Time   WBC 3.3* 01/30/2015 0855   RBC 3.52* 01/30/2015 0855   RBC 3.55* 08/02/2014 0825   HGB 11.2* 01/30/2015 0855   HCT 33.7* 01/30/2015 0855   PLT 108* 01/30/2015 0855   MCV 95.7 01/30/2015 0855    MCH 31.8 01/30/2015 0855   MCHC 33.2 01/30/2015 0855   RDW 15.3 01/30/2015 0855   LYMPHSABS 0.6* 01/30/2015 0855   MONOABS 0.2 01/30/2015 0855   EOSABS 0.1 01/30/2015 0855   BASOSABS 0.0 01/30/2015 0855   CMP     Component Value Date/Time   NA 142 05/01/2015 0845   K 4.2 05/01/2015 0845   CL 106 05/01/2015 0845   CO2 26 05/01/2015 0845   GLUCOSE 136* 05/01/2015 0845   BUN 32* 05/01/2015 0845   CREATININE 1.42* 05/01/2015 0845   CREATININE 1.17 11/28/2014 0955   CALCIUM 8.9 05/01/2015 0845   PROT 6.6 05/01/2015 0845   ALBUMIN 4.2 05/01/2015 0845   AST 22 05/01/2015 0845   ALT 18 05/01/2015 0845   ALKPHOS 43 05/01/2015 0845   BILITOT 0.6 05/01/2015 0845   GFRNONAA 46* 05/01/2015 0845   GFRAA 53* 05/01/2015 0845    ASSESSMENT and THERAPY PLAN:  Mantle cell lymphoma Chronic fatigue Tremor Pancytopenia  He will continue with maintenance Rituxan. His appetite is good and he denies any night sweats, abdominal pain, or change in appetite. I have discussed with him referral into the [facility]. I advised him that my recommendations in regards to his fatigue are to increase his physical activity. He may benefit from strength training as well.  His pancytopenia is chronic and stable. We will continue with observation.   We will try to get copies of his recent neurology evaluation.  I have repeated the results of his CMP and his kidney function looks slightly improved. On review of prior CT scans he never had evidence of pathologic lymph node involvement. We can go ahead and order repeat imaging of the abdomen.  All questions were answered. The patient knows to call the clinic with any problems, questions or concerns. We can certainly see the patient much sooner if necessary.  This document serves as a record of services personally performed by Ancil Linsey, MD. It was created on her behalf by Arlyce Harman, a trained medical scribe. The creation of this record is based on  the scribe's personal observations and the provider's statements to them. This document has been checked and approved by the attending provider.  I have reviewed the above documentation for accuracy and completeness, and I agree with the above.  This note was electronically signed.  Ancil Linsey, MD

## 2015-05-01 NOTE — Progress Notes (Signed)
0935:  [name] presents today for injection per the provider's orders.  B12 administration without incident; see MAR for injection details.  Patient tolerated procedure well and without incident.  No questions or complaints noted at this time.  1305:  Tolerated infusion w/o adverse reaction; a&xo4; VSS.  Discharged ambulatory.

## 2015-05-01 NOTE — Patient Instructions (Addendum)
[facility] Discharge Instructions  RECOMMENDATIONS MADE BY THE CONSULTANT AND ANY TEST RESULTS WILL BE SENT TO YOUR REFERRING PHYSICIAN.  1.  Return as scheduled for Rituxan every 60 days; Vit B12 injections monthly, and an office visit in 2 months. 2.  Please call our office sooner if you have questions, concerns, worsening symptoms as discussed.  Thank you for choosing [facility] at [facility] to provide your oncology and hematology care.  To afford each patient quality time with our provider, please arrive at least 15 minutes before your scheduled appointment time.    You need to re-schedule your appointment should you arrive 10 or more minutes late.  We strive to give you quality time with our providers, and arriving late affects you and other patients whose appointments are after yours.  Also, if you no show three or more times for appointments you may be dismissed from the clinic at the providers discretion.     Again, thank you for choosing [facility].  Our hope is that these requests will decrease the amount of time that you wait before being seen by our physicians.       _____________________________________________________________  Should you have questions after your visit to [facility], please contact our office at [phone] between the hours of 8:30 a.m. and 4:30 p.m.  Voicemails left after 4:30 p.m. will not be returned until the following business day.  For prescription refill requests, have your pharmacy contact our office.      

## 2015-05-05 ENCOUNTER — Other Ambulatory Visit (HOSPITAL_COMMUNITY): Payer: Self-pay | Admitting: *Deleted

## 2015-05-05 DIAGNOSIS — C8310 Mantle cell lymphoma, unspecified site: Secondary | ICD-10-CM

## 2015-05-08 ENCOUNTER — Other Ambulatory Visit (HOSPITAL_COMMUNITY): Payer: Medicare Other

## 2015-05-09 ENCOUNTER — Encounter (HOSPITAL_BASED_OUTPATIENT_CLINIC_OR_DEPARTMENT_OTHER): Payer: Medicare Other

## 2015-05-09 ENCOUNTER — Other Ambulatory Visit (HOSPITAL_COMMUNITY): Payer: Medicare Other

## 2015-05-09 DIAGNOSIS — C8310 Mantle cell lymphoma, unspecified site: Secondary | ICD-10-CM

## 2015-05-09 LAB — COMPREHENSIVE METABOLIC PANEL
ALK PHOS: 52 U/L (ref 38–126)
ALT: 5 U/L — AB (ref 17–63)
AST: 21 U/L (ref 15–41)
Albumin: 4.1 g/dL (ref 3.5–5.0)
Anion gap: 9 (ref 5–15)
BILIRUBIN TOTAL: 0.7 mg/dL (ref 0.3–1.2)
BUN: 27 mg/dL — ABNORMAL HIGH (ref 6–20)
CALCIUM: 9.1 mg/dL (ref 8.9–10.3)
CO2: 27 mmol/L (ref 22–32)
CREATININE: 1.25 mg/dL — AB (ref 0.61–1.24)
Chloride: 104 mmol/L (ref 101–111)
GFR, EST NON AFRICAN AMERICAN: 54 mL/min — AB (ref >60)
Glucose, Bld: 133 mg/dL — ABNORMAL HIGH (ref 65–99)
Potassium: 4.4 mmol/L (ref 3.5–5.1)
Sodium: 140 mmol/L (ref 135–145)
TOTAL PROTEIN: 6.8 g/dL (ref 6.5–8.1)

## 2015-05-09 NOTE — Progress Notes (Signed)
Labs drawn

## 2015-05-10 ENCOUNTER — Other Ambulatory Visit (HOSPITAL_COMMUNITY): Payer: Medicare Other

## 2015-05-11 ENCOUNTER — Ambulatory Visit (HOSPITAL_COMMUNITY)
Admission: RE | Admit: 2015-05-11 | Discharge: 2015-05-11 | Disposition: A | Discharge status: Home / Self Care | Payer: Medicare Other | Source: Ambulatory Visit | Attending: Hematology & Oncology | Admitting: Hematology & Oncology

## 2015-05-11 DIAGNOSIS — Z9221 Personal history of antineoplastic chemotherapy: Secondary | ICD-10-CM | POA: Diagnosis not present

## 2015-05-11 DIAGNOSIS — K8020 Calculus of gallbladder without cholecystitis without obstruction: Secondary | ICD-10-CM | POA: Diagnosis not present

## 2015-05-11 DIAGNOSIS — Z08 Encounter for follow-up examination after completed treatment for malignant neoplasm: Secondary | ICD-10-CM | POA: Insufficient documentation

## 2015-05-11 DIAGNOSIS — Z85038 Personal history of other malignant neoplasm of large intestine: Secondary | ICD-10-CM | POA: Insufficient documentation

## 2015-05-11 DIAGNOSIS — Z8546 Personal history of malignant neoplasm of prostate: Secondary | ICD-10-CM | POA: Diagnosis not present

## 2015-05-11 DIAGNOSIS — K5730 Diverticulosis of large intestine without perforation or abscess without bleeding: Secondary | ICD-10-CM | POA: Diagnosis not present

## 2015-05-11 DIAGNOSIS — Z923 Personal history of irradiation: Secondary | ICD-10-CM | POA: Insufficient documentation

## 2015-05-11 DIAGNOSIS — R918 Other nonspecific abnormal finding of lung field: Secondary | ICD-10-CM | POA: Insufficient documentation

## 2015-05-11 DIAGNOSIS — C8310 Mantle cell lymphoma, unspecified site: Secondary | ICD-10-CM | POA: Diagnosis not present

## 2015-05-11 DIAGNOSIS — I2510 Atherosclerotic heart disease of native coronary artery without angina pectoris: Secondary | ICD-10-CM | POA: Insufficient documentation

## 2015-05-12 DIAGNOSIS — N182 Chronic kidney disease, stage 2 (mild): Secondary | ICD-10-CM | POA: Diagnosis not present

## 2015-05-12 DIAGNOSIS — E119 Type 2 diabetes mellitus without complications: Secondary | ICD-10-CM | POA: Diagnosis not present

## 2015-05-12 DIAGNOSIS — G20 Parkinson's disease: Secondary | ICD-10-CM | POA: Diagnosis not present

## 2015-05-12 DIAGNOSIS — D519 Vitamin B12 deficiency anemia, unspecified: Secondary | ICD-10-CM | POA: Diagnosis not present

## 2015-05-26 DIAGNOSIS — B351 Tinea unguium: Secondary | ICD-10-CM | POA: Diagnosis not present

## 2015-05-26 DIAGNOSIS — E1142 Type 2 diabetes mellitus with diabetic polyneuropathy: Secondary | ICD-10-CM | POA: Diagnosis not present

## 2015-06-01 ENCOUNTER — Encounter (HOSPITAL_COMMUNITY): Payer: Medicare Other | Attending: Hematology and Oncology

## 2015-06-01 VITALS — BP 119/78 | HR 72 | Temp 98.0°F | Resp 18

## 2015-06-01 DIAGNOSIS — E538 Deficiency of other specified B group vitamins: Secondary | ICD-10-CM

## 2015-06-01 DIAGNOSIS — C8310 Mantle cell lymphoma, unspecified site: Secondary | ICD-10-CM

## 2015-06-01 MED ORDER — CYANOCOBALAMIN 1000 MCG/ML IJ SOLN
1000.0000 ug | Freq: Once | INTRAMUSCULAR | Status: AC
  Administered 2015-06-01: 1000 ug via INTRAMUSCULAR
  Filled 2015-06-01: qty 1

## 2015-06-01 NOTE — Progress Notes (Unsigned)
[name] presents today for injection per MD orders. B12 1047mg administered IM in right Upper Arm. Administration without incident. Patient tolerated well.

## 2015-06-20 ENCOUNTER — Ambulatory Visit (INDEPENDENT_AMBULATORY_CARE_PROVIDER_SITE_OTHER): Payer: Medicare Other | Admitting: Internal Medicine

## 2015-06-20 ENCOUNTER — Encounter (INDEPENDENT_AMBULATORY_CARE_PROVIDER_SITE_OTHER): Payer: Self-pay | Admitting: Internal Medicine

## 2015-06-20 VITALS — BP 146/70 | HR 60 | Temp 97.3°F | Ht 71.0 in | Wt 236.9 lb

## 2015-06-20 DIAGNOSIS — K6289 Other specified diseases of anus and rectum: Secondary | ICD-10-CM | POA: Diagnosis not present

## 2015-06-20 DIAGNOSIS — C8310 Mantle cell lymphoma, unspecified site: Secondary | ICD-10-CM | POA: Diagnosis not present

## 2015-06-20 DIAGNOSIS — T83410S Breakdown (mechanical) of penile (implanted) prosthesis, sequela: Secondary | ICD-10-CM

## 2015-06-20 MED ORDER — HYDROCORTISONE ACE-PRAMOXINE 1-1 % RE FOAM: 1.0000 | Freq: Two times a day (BID) | RECTAL | Status: DC

## 2015-06-20 NOTE — Patient Instructions (Addendum)
Keep appt with Dr. Vivi Martens. Will schedule colonoscopy after he sees Dr. Harriet [name].

## 2015-06-20 NOTE — Progress Notes (Addendum)
Subjective:    Patient ID: [name], male    DOB: 09-30-1934, 80 y.o.   MRN: [id number]  HPI Here today for f/u. He was last seen in March. Hx of mantle cell lymphoma. Diagnosed with mantle cell lymphoma of the colon in December 2014.  Takes chemo once every 2 months (Rituxin).  He goes on the 06/30/2015 for Rituxin. He says he is doing okay. He feels weak.  He has gone thru physical therapy which he says did not help. His appetite is good. No weight loss. His weight in March was 228. Today his weight is 236.9. He usually has a BM daily. No melena or BRRB.  Family hx of colon caner in a brother. He all says he has a soreness to his  left testicle. Has penile implant.   . He usually has a BM twice a day.  States his rectum is sore and has been using Lotrisone cream.  CBC    Component Value Date/Time   WBC 3.3* 01/30/2015 0855   RBC 3.52* 01/30/2015 0855   RBC 3.55* 08/02/2014 0825   HGB 11.2* 01/30/2015 0855   HCT 33.7* 01/30/2015 0855   PLT 108* 01/30/2015 0855   MCV 95.7 01/30/2015 0855   MCH 31.8 01/30/2015 0855   MCHC 33.2 01/30/2015 0855   RDW 15.3 01/30/2015 0855   LYMPHSABS 0.6* 01/30/2015 0855   MONOABS 0.2 01/30/2015 0855   EOSABS 0.1 01/30/2015 0855   BASOSABS 0.0 01/30/2015 0855    CMP Latest Ref Rng 05/09/2015 05/01/2015 01/30/2015  Glucose 65 - 99 mg/dL 133(H) 136(H) 110(H)  BUN 6 - 20 mg/dL 27(H) 32(H) 25(H)  Creatinine 0.61 - 1.24 mg/dL 1.25(H) 1.42(H) 1.16  Sodium 135 - 145 mmol/L 140 142 145  Potassium 3.5 - 5.1 mmol/L 4.4 4.2 4.1  Chloride 101 - 111 mmol/L 104 106 111  CO2 22 - 32 mmol/L '27 26 26  '$ Calcium 8.9 - 10.3 mg/dL 9.1 8.9 9.0  Total Protein 6.5 - 8.1 g/dL 6.8 6.6 6.5  Total Bilirubin 0.3 - 1.2 mg/dL 0.7 0.6 0.6  Alkaline Phos 38 - 126 U/L 52 43 49  AST 15 - 41 U/L '21 22 19  '$ ALT 17 - 63 U/L 5(L) 18 15(L)          Patient ID: [name], male DOB: 05/24/1935, 80 y.o. MRN: [id number]   12/31/2013 CT abdomen/pelvis; abdominal  pain, rectal bleeding.  IMPRESSION: 1. No acute findings or explanation for left lower quadrant abdominal pain identified. 2. Sigmoid colon diverticulosis without evidence of acute inflammation. 3. Bladder prolapse status post prostatectomy. 4. Stable cholelithiasis, atherosclerosis, renal cysts and pancreatic atrophy.   02/28/2014 Colonoscopy:  Indications: Patient is 58 old Caucasian male who was diagnosed with mantle cell lymphoma of colon in December 2014. He underwent colonoscopy in March 2015 0.4 cycles of chemotherapy and still had some residual disease. He has received 2 more cycles of chemotherapy and not returning for evaluation. He denies abdominal pain rectal bleeding or diarrhea. He has history of ulcerative colitis and has remained in remission. Impression:  Examination performed to cecum. No mucosal nodularity or other abnormalities noted. Multiple biopsies taken from proximal and distal colon and submitted separately. Mild sigmoid colon diverticulosis. Small external hemorrhoids and anal papilla.    2015 at 8:03 PM Results reviewed with patient and his wife [name]. Colonic biopsy reveals no abnormality. Report to PCP     08/27/2013 EGD, ED & Colonoscopy  Indications: Patient is 80 year old Caucasian male  who has history of GERD presents with dysphagia primarily to solids. He also has recurrent hematochezia lower abdominal pain. He also gives history of single episode of melena earlier this week. He has history of ulcerative colitis but currently on no medications. Down the street is positive for colon carcinoma in his brother who was 42 at the time of diagnosis. Patient's last colonoscopy was in June 2011.  Impression:  EGD findings; Single small ulcer at distal esophagus along with erythema and edema to mucosa at GE junction. These changes are  secondary to reflux esophagitis. Erosive antral gastritis. Esophagus dilated by passing 56 French Maloney dilator but no mucosal destruction induced. 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 Biopsy taken. Segmental colitis involving mid sigmoid colon. Biopsy taken. Mild sigmoid colon diverticulosis. External hemorrhoids and single anal papilla.      Biopsy from fine nodules involving two thirds of colon revealed mantle cell lymphoma. Biopsy from sigmoid colon is negative. Will request oncology consultation. I talked with Meriel Flavors and oncology clinic and requested consultation. Report to PCP          Review of Systems Past Medical History  Diagnosis Date  . Type 2 diabetes mellitus   . 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 Mixed hyperlipidemia   . PVD (peripheral vascular disease)   . Essential hypertension, benign   . Prostate cancer   . Colon cancer   . B12 deficiency 02/07/2014  . Obstructive sleep apnea     does not use  . GERD (gastroesophageal reflux disease)     Past Surgical History  Procedure Laterality Date  . Tonsillectomy    . Back surgery    . Vasectomy    . Knee surgery Right     total knee  . Shoulder surgery    . Prostatectomy    . Colonoscopy with esophagogastroduodenoscopy (egd) N/A 08/27/2013    Procedure: COLONOSCOPY WITH ESOPHAGOGASTRODUODENOSCOPY (EGD);  Surgeon: Rogene Houston, MD;  [facility];  Service: Endoscopy;  Laterality: N/A;  730  . Balloon dilation N/A 08/27/2013    Procedure: BALLOON DILATION;  Surgeon: Rogene Houston, MD;  [facility];  Service: Endoscopy;  Laterality: N/A;  Venia Minks dilation N/A 08/27/2013    Procedure: Venia Minks DILATION;  Surgeon: Rogene Houston, MD;  [facility];  Service: Endoscopy;  Laterality:  N/A;  . Savory dilation N/A 08/27/2013    Procedure: SAVORY DILATION;  Surgeon: Rogene Houston, MD;  [facility];  Service: Endoscopy;  Laterality: N/A;  . Portacath placement Right 2014  . Colonoscopy N/A 12/17/2013    Procedure: COLONOSCOPY;  Surgeon: Rogene Houston, MD;  [facility];  Service: Endoscopy;  Laterality: N/A;  940  . Colon surgery    . Knee arthroscopy Left   . Removal of port Right   . Colonoscopy N/A 02/28/2014    Procedure: COLONOSCOPY;  Surgeon: Rogene Houston, MD;  [facility];  Service: Endoscopy;  Laterality: N/A;  730    Allergies  Allergen Reactions  . Bee Venom Anaphylaxis  . Adhesive [Tape] Hives  . Ciprofloxacin     Unknown   . Codeine     Unknown    . Iodine     Unknown    . Latex  Unknown    . Povidone-Iodine     Unknown    . Simvastatin     Unknown      Current Outpatient Prescriptions on File Prior to Visit  Medication Sig Dispense Refill  . ACCU-CHEK AVIVA PLUS test strip   1  . acetaminophen (TYLENOL) 325 MG tablet Take 650 mg by mouth every 4 (four) hours as needed for mild pain or moderate pain. Take 2 tablets 1 hour prior to Rituxan treatment.    [name] acyclovir (ZOVIRAX) 400 MG tablet Take 1 tablet (400 mg total) by mouth 2 (two) times daily. 60 tablet 6  . amLODipine (NORVASC) 5 MG tablet Take 5 mg by mouth daily with breakfast.    . atorvastatin (LIPITOR) 40 MG tablet     . carbidopa-levodopa (SINEMET IR) 25-100 MG per tablet     . Cyanocobalamin (VITAMIN B-12 IJ) Inject as directed every 30 (thirty) days.     . diazepam (VALIUM) 5 MG tablet Take one tablet at bedtime if needed for sleep. 90 tablet 0  . diphenhydrAMINE (BENADRYL) 25 MG tablet Take 25 mg by mouth daily as needed. Take 2 tablets 1 hour prior to Rituxan treatment.    [name] escitalopram (LEXAPRO) 20 MG tablet Take one tablet daily at bedtime 60 tablet 6  . insulin glargine (LANTUS SOLOSTAR) 100 UNIT/ML injection Inject 50 Units into the  skin every morning.     . Lancets (ACCU-CHEK MULTICLIX) lancets   1  . LANTUS SOLOSTAR 100 UNIT/ML Solostar Pen     . levocetirizine (XYZAL) 5 MG tablet Take 5 mg by mouth every evening.    . lidocaine-prilocaine (EMLA) cream Apply 1 application topically daily as needed (chemo). Apply a quarter size amount to port site 1 hour prior to chemo. Do not rub in. Cover with plastic wrap.    . lisinopril (PRINIVIL,ZESTRIL) 40 MG tablet Take 40 mg by mouth daily with breakfast.    . LORazepam (ATIVAN) 0.5 MG tablet Take one tablet every 4 hours as needed for nausea. 60 tablet 2  . metFORMIN (GLUCOPHAGE) 1000 MG tablet Take 1,000 mg by mouth 2 (two) times daily with a meal.     . metoCLOPramide (REGLAN) 10 MG tablet Take one or two tablets 4 times a day before meals and at bedtime as needed for nausea. 60 tablet 4  . mupirocin ointment (BACTROBAN) 2 %     . pantoprazole (PROTONIX) 40 MG tablet Take 1 tablet (40 mg total) by mouth daily. 90 tablet 3  . pantoprazole (PROTONIX) 40 MG tablet Take 1 tablet (40 mg total) by mouth daily. 30 tablet 11  . prochlorperazine (COMPAZINE) 10 MG tablet Take 1 tablet (10 mg total) by mouth every 6 (six) hours as needed for nausea or vomiting. 60 tablet 3  . sulfacetamide (BLEPH-10) 10 % ophthalmic solution     . traMADol (ULTRAM) 50 MG tablet Take 1 tablet (50 mg total) by mouth every 6 (six) hours as needed. 60 tablet 0  . atorvastatin (LIPITOR) 40 MG tablet Take 1 tablet (40 mg total) by mouth every morning. 30 tablet 0  . [DISCONTINUED] insulin aspart (NOVOLOG) 100 UNIT/ML injection Inject 20 Units into the skin 3 (three) times daily before meals.      . [DISCONTINUED] rosuvastatin (CRESTOR) 40 MG tablet Take 40 mg by mouth daily.       No current facility-administered medications on file prior to visit.        Objective:   Physical Exam Blood  pressure 146/70, pulse 60, temperature 97.3 F (36.3 C), height '5\' 11"'$  (1.803 m), weight 236 lb 14.4 oz (107.457  kg). Alert and oriented. Skin warm and dry. Oral mucosa is moist.   . Sclera anicteric, conjunctivae is pink. Thyroid not enlarged. No cervical lymphadenopathy. Lungs clear. Heart regular rate and rhythm.  Abdomen is soft. Bowel sounds are positive. No hepatomegaly. No abdominal masses felt. No tenderness.  No edema to lower extremities.   Dr. Laural Golden in to check scrotum/penile implant. Implant appears displaced per Dr. Laural Golden.  Redness to rectal area.           Assessment & Plan:  Hx of Mantle cell lymphoma. Needs surveillance colonoscopy for Mantle cell lymphoma. He will call Dr. Lenoria Chime today and make appt for his implant to be checked. After he has been seen by Dr. Odis Luster, he will be scheduled for a colonoscopy (triaged).  Rectal irritation: Rx for Proctofoam called to his pharmacy.

## 2015-06-22 DIAGNOSIS — E538 Deficiency of other specified B group vitamins: Secondary | ICD-10-CM | POA: Diagnosis not present

## 2015-06-22 DIAGNOSIS — N529 Male erectile dysfunction, unspecified: Secondary | ICD-10-CM | POA: Diagnosis not present

## 2015-06-22 DIAGNOSIS — N359 Urethral stricture, unspecified: Secondary | ICD-10-CM | POA: Diagnosis not present

## 2015-06-22 DIAGNOSIS — N508 Other specified disorders of male genital organs: Secondary | ICD-10-CM | POA: Diagnosis not present

## 2015-06-22 DIAGNOSIS — Z8546 Personal history of malignant neoplasm of prostate: Secondary | ICD-10-CM | POA: Diagnosis not present

## 2015-06-22 DIAGNOSIS — N393 Stress incontinence (female) (male): Secondary | ICD-10-CM | POA: Diagnosis not present

## 2015-06-22 DIAGNOSIS — E291 Testicular hypofunction: Secondary | ICD-10-CM | POA: Diagnosis not present

## 2015-06-29 DIAGNOSIS — C61 Malignant neoplasm of prostate: Secondary | ICD-10-CM | POA: Diagnosis not present

## 2015-06-29 DIAGNOSIS — R252 Cramp and spasm: Secondary | ICD-10-CM | POA: Diagnosis not present

## 2015-06-29 DIAGNOSIS — E785 Hyperlipidemia, unspecified: Secondary | ICD-10-CM | POA: Diagnosis not present

## 2015-06-29 DIAGNOSIS — C801 Malignant (primary) neoplasm, unspecified: Secondary | ICD-10-CM | POA: Diagnosis not present

## 2015-06-29 DIAGNOSIS — C8310 Mantle cell lymphoma, unspecified site: Secondary | ICD-10-CM | POA: Diagnosis not present

## 2015-06-29 DIAGNOSIS — G20 Parkinson's disease: Secondary | ICD-10-CM | POA: Diagnosis not present

## 2015-06-29 DIAGNOSIS — R2681 Unsteadiness on feet: Secondary | ICD-10-CM | POA: Diagnosis not present

## 2015-06-29 DIAGNOSIS — E119 Type 2 diabetes mellitus without complications: Secondary | ICD-10-CM | POA: Diagnosis not present

## 2015-06-29 DIAGNOSIS — G4733 Obstructive sleep apnea (adult) (pediatric): Secondary | ICD-10-CM | POA: Diagnosis not present

## 2015-06-29 DIAGNOSIS — G3184 Mild cognitive impairment, so stated: Secondary | ICD-10-CM | POA: Diagnosis not present

## 2015-06-30 ENCOUNTER — Encounter (HOSPITAL_BASED_OUTPATIENT_CLINIC_OR_DEPARTMENT_OTHER): Payer: Medicare Other | Admitting: Hematology & Oncology

## 2015-06-30 ENCOUNTER — Observation Stay (HOSPITAL_COMMUNITY)
Admission: EM | Admit: 2015-06-30 | Discharge: 2015-07-02 | Disposition: A | Discharge status: Home / Self Care | Payer: Medicare Other | Attending: Internal Medicine | Admitting: Internal Medicine

## 2015-06-30 ENCOUNTER — Encounter (HOSPITAL_COMMUNITY): Payer: Self-pay | Admitting: Hematology & Oncology

## 2015-06-30 ENCOUNTER — Encounter (HOSPITAL_COMMUNITY): Payer: Medicare Other | Attending: Hematology and Oncology

## 2015-06-30 ENCOUNTER — Encounter (HOSPITAL_COMMUNITY): Payer: Medicare Other

## 2015-06-30 ENCOUNTER — Other Ambulatory Visit: Payer: Self-pay

## 2015-06-30 ENCOUNTER — Encounter (HOSPITAL_COMMUNITY): Payer: Self-pay

## 2015-06-30 ENCOUNTER — Encounter (HOSPITAL_COMMUNITY): Payer: Self-pay | Admitting: Emergency Medicine

## 2015-06-30 VITALS — BP 123/75 | HR 62 | Temp 97.8°F | Resp 16 | Wt 236.4 lb

## 2015-06-30 DIAGNOSIS — N179 Acute kidney failure, unspecified: Secondary | ICD-10-CM | POA: Insufficient documentation

## 2015-06-30 DIAGNOSIS — Z794 Long term (current) use of insulin: Secondary | ICD-10-CM | POA: Insufficient documentation

## 2015-06-30 DIAGNOSIS — I2510 Atherosclerotic heart disease of native coronary artery without angina pectoris: Secondary | ICD-10-CM | POA: Insufficient documentation

## 2015-06-30 DIAGNOSIS — Z91040 Latex allergy status: Secondary | ICD-10-CM | POA: Diagnosis not present

## 2015-06-30 DIAGNOSIS — Z8546 Personal history of malignant neoplasm of prostate: Secondary | ICD-10-CM | POA: Diagnosis not present

## 2015-06-30 DIAGNOSIS — Z79899 Other long term (current) drug therapy: Secondary | ICD-10-CM | POA: Insufficient documentation

## 2015-06-30 DIAGNOSIS — C8310 Mantle cell lymphoma, unspecified site: Secondary | ICD-10-CM | POA: Diagnosis present

## 2015-06-30 DIAGNOSIS — Z23 Encounter for immunization: Secondary | ICD-10-CM | POA: Diagnosis not present

## 2015-06-30 DIAGNOSIS — I10 Essential (primary) hypertension: Secondary | ICD-10-CM | POA: Diagnosis not present

## 2015-06-30 DIAGNOSIS — D61818 Other pancytopenia: Secondary | ICD-10-CM

## 2015-06-30 DIAGNOSIS — G4733 Obstructive sleep apnea (adult) (pediatric): Secondary | ICD-10-CM | POA: Diagnosis not present

## 2015-06-30 DIAGNOSIS — R61 Generalized hyperhidrosis: Secondary | ICD-10-CM | POA: Diagnosis not present

## 2015-06-30 DIAGNOSIS — R635 Abnormal weight gain: Secondary | ICD-10-CM | POA: Diagnosis not present

## 2015-06-30 DIAGNOSIS — E782 Mixed hyperlipidemia: Secondary | ICD-10-CM | POA: Insufficient documentation

## 2015-06-30 DIAGNOSIS — K219 Gastro-esophageal reflux disease without esophagitis: Secondary | ICD-10-CM | POA: Diagnosis not present

## 2015-06-30 DIAGNOSIS — R5382 Chronic fatigue, unspecified: Secondary | ICD-10-CM | POA: Diagnosis not present

## 2015-06-30 DIAGNOSIS — E538 Deficiency of other specified B group vitamins: Secondary | ICD-10-CM | POA: Diagnosis not present

## 2015-06-30 DIAGNOSIS — C61 Malignant neoplasm of prostate: Secondary | ICD-10-CM

## 2015-06-30 DIAGNOSIS — Z85038 Personal history of other malignant neoplasm of large intestine: Secondary | ICD-10-CM | POA: Diagnosis not present

## 2015-06-30 DIAGNOSIS — R7989 Other specified abnormal findings of blood chemistry: Secondary | ICD-10-CM | POA: Diagnosis present

## 2015-06-30 DIAGNOSIS — E875 Hyperkalemia: Secondary | ICD-10-CM | POA: Diagnosis not present

## 2015-06-30 DIAGNOSIS — N1832 Chronic kidney disease, stage 3b: Secondary | ICD-10-CM | POA: Diagnosis present

## 2015-06-30 DIAGNOSIS — E119 Type 2 diabetes mellitus without complications: Secondary | ICD-10-CM | POA: Diagnosis not present

## 2015-06-30 DIAGNOSIS — D696 Thrombocytopenia, unspecified: Secondary | ICD-10-CM | POA: Diagnosis present

## 2015-06-30 DIAGNOSIS — N183 Chronic kidney disease, stage 3 (moderate): Secondary | ICD-10-CM

## 2015-06-30 DIAGNOSIS — Z5112 Encounter for antineoplastic immunotherapy: Secondary | ICD-10-CM

## 2015-06-30 LAB — CBC WITH DIFFERENTIAL/PLATELET
Basophils Absolute: 0 10*3/uL (ref 0.0–0.1)
Basophils Relative: 1 %
EOS ABS: 0.1 10*3/uL (ref 0.0–0.7)
Eosinophils Relative: 3 %
HEMATOCRIT: 33.1 % — AB (ref 39.0–52.0)
HEMOGLOBIN: 10.9 g/dL — AB (ref 13.0–17.0)
LYMPHS ABS: 0.9 10*3/uL (ref 0.7–4.0)
Lymphocytes Relative: 26 %
MCH: 31.9 pg (ref 26.0–34.0)
MCHC: 32.9 g/dL (ref 30.0–36.0)
MCV: 96.8 fL (ref 78.0–100.0)
MONOS PCT: 7 %
Monocytes Absolute: 0.2 10*3/uL (ref 0.1–1.0)
NEUTROS ABS: 2.1 10*3/uL (ref 1.7–7.7)
NEUTROS PCT: 63 %
Platelets: 88 10*3/uL — ABNORMAL LOW (ref 150–400)
RBC: 3.42 MIL/uL — AB (ref 4.22–5.81)
RDW: 15.1 % (ref 11.5–15.5)
WBC: 3.4 10*3/uL — AB (ref 4.0–10.5)

## 2015-06-30 LAB — URINE MICROSCOPIC-ADD ON

## 2015-06-30 LAB — URINALYSIS, ROUTINE W REFLEX MICROSCOPIC
BILIRUBIN URINE: NEGATIVE
GLUCOSE, UA: NEGATIVE mg/dL
KETONES UR: NEGATIVE mg/dL
LEUKOCYTES UA: NEGATIVE
NITRITE: NEGATIVE
PH: 5.5 (ref 5.0–8.0)
PROTEIN: NEGATIVE mg/dL
Specific Gravity, Urine: 1.025 (ref 1.005–1.030)
Urobilinogen, UA: 0.2 mg/dL (ref 0.0–1.0)

## 2015-06-30 LAB — COMPREHENSIVE METABOLIC PANEL
ALBUMIN: 4.1 g/dL (ref 3.5–5.0)
ALK PHOS: 43 U/L (ref 38–126)
ALT: 21 U/L (ref 17–63)
AST: 19 U/L (ref 15–41)
Anion gap: 8 (ref 5–15)
BILIRUBIN TOTAL: 0.5 mg/dL (ref 0.3–1.2)
BUN: 44 mg/dL — ABNORMAL HIGH (ref 6–20)
CO2: 23 mmol/L (ref 22–32)
CREATININE: 2.48 mg/dL — AB (ref 0.61–1.24)
Calcium: 9 mg/dL (ref 8.9–10.3)
Chloride: 107 mmol/L (ref 101–111)
GFR calc non Af Amer: 23 mL/min — ABNORMAL LOW (ref >60)
GFR, EST AFRICAN AMERICAN: 27 mL/min — AB (ref >60)
GLUCOSE: 209 mg/dL — AB (ref 65–99)
Potassium: 6.1 mmol/L (ref 3.5–5.1)
SODIUM: 138 mmol/L (ref 135–145)
TOTAL PROTEIN: 6.5 g/dL (ref 6.5–8.1)

## 2015-06-30 LAB — LACTATE DEHYDROGENASE: LDH: 150 U/L (ref 98–192)

## 2015-06-30 LAB — PSA: PSA: <0.01 ng/mL (ref 0.00–4.00)

## 2015-06-30 MED ORDER — INFLUENZA VAC SPLIT QUAD 0.5 ML IM SUSY
0.5000 mL | PREFILLED_SYRINGE | Freq: Once | INTRAMUSCULAR | Status: AC
  Administered 2015-06-30: 0.5 mL via INTRAMUSCULAR
  Filled 2015-06-30: qty 0.5

## 2015-06-30 MED ORDER — SODIUM POLYSTYRENE SULFONATE 15 GM/60ML PO SUSP
30.0000 g | Freq: Once | ORAL | Status: AC
  Administered 2015-06-30: 30 g via ORAL
  Filled 2015-06-30: qty 120

## 2015-06-30 MED ORDER — SODIUM CHLORIDE 0.9 % IV SOLN
Freq: Once | INTRAVENOUS | Status: AC
  Administered 2015-06-30: 09:00:00 via INTRAVENOUS

## 2015-06-30 MED ORDER — INSULIN ASPART 100 UNIT/ML ~~LOC~~ SOLN
0.0000 [IU] | Freq: Three times a day (TID) | SUBCUTANEOUS | Status: DC
  Administered 2015-07-01: 1 [IU] via SUBCUTANEOUS

## 2015-06-30 MED ORDER — LIDOCAINE-PRILOCAINE 2.5-2.5 % EX CREA: 1.0000 "application " | TOPICAL_CREAM | Freq: Every day | CUTANEOUS | Status: DC | PRN

## 2015-06-30 MED ORDER — SODIUM CHLORIDE 0.9 % IJ SOLN
10.0000 mL | INTRAMUSCULAR | Status: DC | PRN
  Administered 2015-06-30 (×2): 10 mL
  Filled 2015-06-30 (×2): qty 10

## 2015-06-30 MED ORDER — SODIUM CHLORIDE 0.9 % IV BOLUS (SEPSIS)
1000.0000 mL | Freq: Once | INTRAVENOUS | Status: AC
  Administered 2015-06-30: 1000 mL via INTRAVENOUS

## 2015-06-30 MED ORDER — HYDROCORTISONE ACE-PRAMOXINE 1-1 % RE FOAM
1.0000 | Freq: Two times a day (BID) | RECTAL | Status: DC
  Administered 2015-06-30: 1 via RECTAL
  Filled 2015-06-30: qty 10

## 2015-06-30 MED ORDER — SODIUM CHLORIDE 0.9 % IJ SOLN
3.0000 mL | Freq: Two times a day (BID) | INTRAMUSCULAR | Status: DC
  Administered 2015-06-30 – 2015-07-01 (×2): 3 mL via INTRAVENOUS

## 2015-06-30 MED ORDER — ENOXAPARIN SODIUM 30 MG/0.3ML ~~LOC~~ SOLN: 30.0000 mg | SUBCUTANEOUS | Status: DC

## 2015-06-30 MED ORDER — DIAZEPAM 5 MG PO TABS: 5.0000 mg | ORAL_TABLET | Freq: Two times a day (BID) | ORAL | Status: DC | PRN

## 2015-06-30 MED ORDER — ACETAMINOPHEN 325 MG PO TABS
650.0000 mg | ORAL_TABLET | Freq: Once | ORAL | Status: AC
  Administered 2015-06-30: 650 mg via ORAL
  Filled 2015-06-30: qty 2

## 2015-06-30 MED ORDER — ONDANSETRON HCL 4 MG PO TABS: 4.0000 mg | ORAL_TABLET | Freq: Four times a day (QID) | ORAL | Status: DC | PRN

## 2015-06-30 MED ORDER — SODIUM CHLORIDE 0.9 % IV SOLN
INTRAVENOUS | Status: DC
  Administered 2015-07-01 – 2015-07-02 (×4): via INTRAVENOUS

## 2015-06-30 MED ORDER — ONDANSETRON HCL 4 MG/2ML IJ SOLN: 4.0000 mg | Freq: Four times a day (QID) | INTRAMUSCULAR | Status: DC | PRN

## 2015-06-30 MED ORDER — PANTOPRAZOLE SODIUM 40 MG PO TBEC: 40.0000 mg | DELAYED_RELEASE_TABLET | Freq: Every day | ORAL | Status: DC

## 2015-06-30 MED ORDER — ACETAMINOPHEN 325 MG PO TABS
650.0000 mg | ORAL_TABLET | ORAL | Status: DC | PRN
  Administered 2015-07-01 – 2015-07-02 (×2): 650 mg via ORAL
  Filled 2015-06-30 (×2): qty 2

## 2015-06-30 MED ORDER — PANTOPRAZOLE SODIUM 40 MG PO TBEC
40.0000 mg | DELAYED_RELEASE_TABLET | Freq: Every day | ORAL | Status: DC
  Administered 2015-07-01 – 2015-07-02 (×2): 40 mg via ORAL
  Filled 2015-06-30 (×2): qty 1

## 2015-06-30 MED ORDER — HEPARIN SOD (PORK) LOCK FLUSH 100 UNIT/ML IV SOLN: 500.0000 [IU] | Freq: Once | INTRAVENOUS | Status: DC | PRN

## 2015-06-30 MED ORDER — CYANOCOBALAMIN 1000 MCG/ML IJ SOLN
1000.0000 ug | Freq: Once | INTRAMUSCULAR | Status: AC
Start: 2015-06-30 — End: 2015-06-30
  Administered 2015-06-30: 1000 ug via INTRAMUSCULAR

## 2015-06-30 MED ORDER — INSULIN GLARGINE 100 UNIT/ML ~~LOC~~ SOLN
50.0000 [IU] | SUBCUTANEOUS | Status: DC
  Administered 2015-07-01: 50 [IU] via SUBCUTANEOUS
  Filled 2015-06-30 (×3): qty 0.5

## 2015-06-30 MED ORDER — CYANOCOBALAMIN 1000 MCG/ML IJ SOLN
INTRAMUSCULAR | Status: AC
  Filled 2015-06-30: qty 1

## 2015-06-30 MED ORDER — SULFACETAMIDE SODIUM 10 % OP SOLN
1.0000 [drp] | Freq: Every day | OPHTHALMIC | Status: DC | PRN
  Filled 2015-06-30: qty 15

## 2015-06-30 MED ORDER — AMLODIPINE BESYLATE 5 MG PO TABS
5.0000 mg | ORAL_TABLET | Freq: Every day | ORAL | Status: DC
  Administered 2015-07-01 – 2015-07-02 (×2): 5 mg via ORAL
  Filled 2015-06-30 (×2): qty 1

## 2015-06-30 MED ORDER — CARBIDOPA-LEVODOPA 25-100 MG PO TABS
1.0000 | ORAL_TABLET | Freq: Four times a day (QID) | ORAL | Status: DC
  Administered 2015-06-30 – 2015-07-02 (×7): 1 via ORAL
  Filled 2015-06-30 (×19): qty 1

## 2015-06-30 MED ORDER — INSULIN ASPART 100 UNIT/ML ~~LOC~~ SOLN: 0.0000 [IU] | Freq: Every day | SUBCUTANEOUS | Status: DC

## 2015-06-30 MED ORDER — RITUXIMAB CHEMO INJECTION 500 MG/50ML
500.0000 mg/m2 | Freq: Once | INTRAVENOUS | Status: AC
  Administered 2015-06-30: 1100 mg via INTRAVENOUS
  Filled 2015-06-30: qty 110

## 2015-06-30 MED ORDER — HYDRALAZINE HCL 20 MG/ML IJ SOLN: 5.0000 mg | Freq: Four times a day (QID) | INTRAMUSCULAR | Status: DC | PRN

## 2015-06-30 MED ORDER — LORAZEPAM 0.5 MG PO TABS: 0.5000 mg | ORAL_TABLET | Freq: Four times a day (QID) | ORAL | Status: DC | PRN

## 2015-06-30 MED ORDER — DIPHENHYDRAMINE HCL 25 MG PO CAPS
50.0000 mg | ORAL_CAPSULE | Freq: Once | ORAL | Status: AC
  Administered 2015-06-30: 50 mg via ORAL
  Filled 2015-06-30: qty 2

## 2015-06-30 NOTE — Progress Notes (Signed)
CRITICAL VALUE ALERT Critical value received:  Potassium 6.1 Date of notification:  06/30/15  Time of notification: 1019am Critical value read back:  Yes.   Nurse who received alert:  T.Shiven Junious,RN MD notified (1st page):  Verbal to Dr.Penland at 1019am/Janaysia Mcleroy orders entered.

## 2015-06-30 NOTE — H&P (Signed)
[facility] History and Physical  [name]:[id number] DOB: 08/16/1935 DOA: 06/30/2015  Referring physician: ER PCP: Wende Neighbors, MD   Chief Complaint: Abnormal blood work.  HPI: [name] is a 80 y.o. 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 He is currently receiving chemotherapy with Rituxan and apparently is doing quite well. Recently, he had been seen by urology because of testicular pain. Presumably he was diagnosed with orchitis or some diagnosis of this nature and was started on Bactrim approximately 1 week ago. He was seen in the oncology clinic today and blood work was done-this showed acute kidney injury with hyperkalemia. He is therefore being admitted for management of this now. He denies any chest pain, dyspnea, palpitations. He denies any fever.  Review of Systems:  Apart from symptoms above, all systems negative.   Past Medical History  Diagnosis Date  . Type 2 diabetes mellitus ([place])   . 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 Mixed hyperlipidemia   . PVD (peripheral vascular disease) ([place])   . Essential hypertension, benign   . Prostate cancer ([place])   . Colon cancer ([place])   . B12 deficiency 02/07/2014  . Obstructive sleep apnea     does not use  . GERD (gastroesophageal reflux disease)    Past Surgical History  Procedure Laterality Date  . Tonsillectomy    . Back surgery    . Vasectomy    . Knee surgery Right     total knee  . Shoulder surgery    . Prostatectomy    . Colonoscopy with esophagogastroduodenoscopy (egd) N/A 08/27/2013    Procedure: COLONOSCOPY WITH ESOPHAGOGASTRODUODENOSCOPY (EGD);  Surgeon: Rogene Houston, MD;  [facility];  Service: Endoscopy;  Laterality: N/A;  730  . Balloon dilation N/A 08/27/2013    Procedure:  BALLOON DILATION;  Surgeon: Rogene Houston, MD;  [facility];  Service: Endoscopy;  Laterality: N/A;  Venia Minks dilation N/A 08/27/2013    Procedure: Venia Minks DILATION;  Surgeon: Rogene Houston, MD;  [facility];  Service: Endoscopy;  Laterality: N/A;  . Savory dilation N/A 08/27/2013    Procedure: SAVORY DILATION;  Surgeon: Rogene Houston, MD;  [facility];  Service: Endoscopy;  Laterality: N/A;  . Portacath placement Right 2014  . Colonoscopy N/A 12/17/2013    Procedure: COLONOSCOPY;  Surgeon: Rogene Houston, MD;  [facility];  Service: Endoscopy;  Laterality: N/A;  940  . Colon surgery    . Knee arthroscopy Left   . Removal of port Right   . Colonoscopy N/A 02/28/2014    Procedure: COLONOSCOPY;  Surgeon: Rogene Houston, MD;  [facility];  Service: Endoscopy;  Laterality: N/A;  730   Social History:  reports that he has never smoked. He has never used smokeless tobacco. He reports that he does not drink alcohol or use illicit drugs.  Allergies  Allergen Reactions  . Bee Venom Anaphylaxis  . Adhesive [Tape] Hives  . Ciprofloxacin     Unknown   . Codeine     Unknown    . Iodine     Unknown    . Latex     Unknown    . Povidone-Iodine     Unknown    .  Simvastatin     Unknown      Family History  Problem Relation Age of Onset  . Colon cancer Brother   . Cancer Brother   . Diabetes Father   . Cancer Brother     Prior to Admission medications   Medication Sig Start Date End Date Taking? Authorizing Provider  acetaminophen (TYLENOL) 325 MG tablet Take 650 mg by mouth every 4 (four) hours as needed for mild pain or moderate pain. Take 2 tablets 1 hour prior to Rituxan treatment.   Yes Historical Provider, MD  amLODipine (NORVASC) 5 MG tablet Take 5 mg by mouth daily with breakfast. 12/24/12  Yes Hillary Bow, MD  atorvastatin (LIPITOR) 40 MG tablet Take 40 mg by mouth daily at 6 PM.  04/19/15  Yes Historical  Provider, MD  carbidopa-levodopa (SINEMET IR) 25-100 MG per tablet Take 1 tablet by mouth 4 (four) times daily.  04/17/15  Yes Historical Provider, MD  Cyanocobalamin (VITAMIN B-12 IJ) Inject as directed every 30 (thirty) days.    Yes Historical Provider, MD  diazepam (VALIUM) 5 MG tablet Take one tablet at bedtime if needed for sleep. 08/02/14  Yes Farrel Gobble, MD  diphenhydrAMINE (BENADRYL) 25 MG tablet Take 25 mg by mouth daily as needed. Take 2 tablets 1 hour prior to Rituxan treatment.   Yes Historical Provider, MD  hydrocortisone-pramoxine (PROCTOFOAM HC) rectal foam Place 1 applicator rectally 2 (two) times daily. 06/20/15  Yes [name], NP  insulin glargine (LANTUS SOLOSTAR) 100 UNIT/ML injection Inject 50 Units into the skin every morning.    Yes Historical Provider, MD  lidocaine-prilocaine (EMLA) cream Apply 1 application topically daily as needed (chemo). Apply a quarter size amount to port site 1 hour prior to chemo. Do not rub in. Cover with plastic wrap. 09/08/13  Yes Farrel Gobble, MD  lisinopril (PRINIVIL,ZESTRIL) 40 MG tablet Take 40 mg by mouth daily with breakfast. 12/24/12  Yes Hillary Bow, MD  metFORMIN (GLUCOPHAGE) 1000 MG tablet Take 1,000 mg by mouth 2 (two) times daily with a meal.    Yes Historical Provider, MD  pantoprazole (PROTONIX) 40 MG tablet Take 1 tablet (40 mg total) by mouth daily. 11/28/14  Yes [name], NP  sulfacetamide (BLEPH-10) 10 % ophthalmic solution Place 1 drop into both eyes daily as needed (irritation).  04/24/15  Yes Historical Provider, MD  sulfamethoxazole-trimethoprim (BACTRIM DS,SEPTRA DS) 800-160 MG tablet Take 1 tablet by mouth 2 (two) times daily.  06/22/15  Yes Historical Provider, MD  acyclovir (ZOVIRAX) 400 MG tablet Take 1 tablet (400 mg total) by mouth 2 (two) times daily. Patient not taking: Reported on 06/30/2015 10/31/14   Patrici Ranks, MD  escitalopram (LEXAPRO) 20 MG tablet Take one tablet daily at bedtime Patient  not taking: Reported on 06/30/2015 05/11/14   Farrel Gobble, MD  LORazepam (ATIVAN) 0.5 MG tablet Take one tablet every 4 hours as needed for nausea. 07/07/14   Baird Cancer, PA-C  prochlorperazine (COMPAZINE) 10 MG tablet Take 1 tablet (10 mg total) by mouth every 6 (six) hours as needed for nausea or vomiting. Patient not taking: Reported on 06/30/2015 09/08/13   Farrel Gobble, MD   Physical Exam: Filed Vitals:   06/30/15 1433 06/30/15 1500 06/30/15 1600  BP: 127/85 120/80 126/78  Pulse: 63 65 63  Temp: 97.7 F (36.5 C)    TempSrc: Oral    Resp: '18 20 21  '$ Height: '5\' 11"'$  (1.803 m)    Weight: 105.235  kg (232 lb)    SpO2: 96% 95% 96%    Wt Readings from Last 3 Encounters:  06/30/15 105.235 kg (232 lb)  06/30/15 107.23 kg (236 lb 6.4 oz)  06/20/15 107.457 kg (236 lb 14.4 oz)    General:  Appears calm and comfortable. Nontoxic. Eyes: PERRL, normal lids, irises & conjunctiva ENT: grossly normal hearing, lips & tongue Neck: no LAD, masses or thyromegaly Cardiovascular: RRR, no m/r/g. No LE edema. Telemetry: SR, no arrhythmias  Respiratory: CTA bilaterally, no w/r/r. Normal respiratory effort. Abdomen: soft, mild tenderness in the lower abdomen but no evidence of an acute abdomen. Skin: no rash or induration seen on limited exam Musculoskeletal: grossly normal tone BUE/BLE Psychiatric: grossly normal mood and affect, speech fluent and appropriate Neurologic: grossly non-focal.          Labs on Admission:  Basic Metabolic Panel:  Recent Labs Lab 06/30/15 0929  NA 138  K 6.1*  CL 107  CO2 23  GLUCOSE 209*  BUN 44*  CREATININE 2.48*  CALCIUM 9.0   Liver Function Tests:  Recent Labs Lab 06/30/15 0929  AST 19  ALT 21  ALKPHOS 43  BILITOT 0.5  PROT 6.5  ALBUMIN 4.1   No results for input(s): LIPASE, AMYLASE in the last 168 hours. No results for input(s): AMMONIA in the last 168 hours. CBC:  Recent Labs Lab 06/30/15 0929  WBC 3.4*  NEUTROABS 2.1    HGB 10.9*  HCT 33.1*  MCV 96.8  PLT 88*   Cardiac Enzymes: No results for input(s): CKTOTAL, CKMB, CKMBINDEX, TROPONINI in the last 168 hours.  BNP (last 3 results) No results for input(s): BNP in the last 8760 hours.  ProBNP (last 3 results) No results for input(s): PROBNP in the last 8760 hours.  CBG: No results for input(s): GLUCAP in the last 168 hours.  Radiological Exams on Admission: No results found.    Assessment/Plan   1. Acute kidney injury. He has doubled his creatinine and potassium 6.1. He will be given Kayexalate and IV fluids with normal saline for hydration. Monitor renal function closely. Discontinue Bactrim, which could be contributing to his electrolyte abnormalities. Check urinalysis. 2. Hypertension. He was taking ACE inhibitor. This may also contribute and I will hold this for the time being. Use intravenous hydralazine when necessary for blood pressure control. 3. Diabetes. Hold metformin. Continue other home medications and sliding scale of insulin. 4. Mental cell lymphoma. 5. Thrombocytopenia, this is chronic.  Patient will be admitted under observation to telemetry. Further recommendations will depend on patient's hospital progress.   Code Status: Full code.  DVT Prophylaxis: SCDs.  Family Communication: I discussed the plan with the patient at the bedside.   Disposition Plan: Home when medically stable  Time spent: 60 minutes.  Doree Albee [facility] Pager [phone].

## 2015-06-30 NOTE — Patient Instructions (Addendum)
..  [place] at [facility] Discharge Instructions  RECOMMENDATIONS MADE BY THE CONSULTANT AND ANY TEST RESULTS WILL BE SENT TO YOUR REFERRING PHYSICIAN.   Continue your antibiotics We will get you back in to see Dr. Laural Golden in 3 weeks, and have him do colonoscopy. Their office will call you We worry about the night sweats that you are having Flu vaccine does not work well in people with lymphoma, We will give vaccine to you today.   If you are around someone with flu let us know and we will give you medicine to prevent flu  Your potassium and your kidney function are elevated today. We will give you a medicine to help lower the potassium and will send you to the emergency room to evaluate for Kidney problems   Thank you for choosing [place] at [facility] to provide your oncology and hematology care.  To afford each patient quality time with our provider, please arrive at least 15 minutes before your scheduled appointment time.    You need to re-schedule your appointment should you arrive 10 or more minutes late.  We strive to give you quality time with our providers, and arriving late affects you and other patients whose appointments are after yours.  Also, if you no show three or more times for appointments you may be dismissed from the clinic at the providers discretion.     Again, thank you for choosing [facility].  Our hope is that these requests will decrease the amount of time that you wait before being seen by our physicians.       _____________________________________________________________  Should you have questions after your visit to [facility], please contact our office at [phone] between the hours of 8:30 a.m. and 4:30 p.m.  Voicemails left after 4:30 p.m. will not be returned until the following business day.  For prescription refill requests, have your pharmacy contact our office.

## 2015-06-30 NOTE — Progress Notes (Signed)
Brandon Neighbors, [name] [place] [address]   DIAGNOSIS: Mantle Cell lymphoma of the large intestine diagnosed 08/2013 Colonoscopy on 02/28/2014 no evidence of disease   SUMMARY OF ONCOLOGIC HISTORY:   Mantle cell lymphoma ([place])   08/27/2013 Initial Diagnosis Colon, biopsy, random - MANTLE CELL LYMPHOMA   09/06/2013 Imaging CT CAP- No significant lymphadenopathy identified within the chest, abdomen or pelvis.   09/14/2013 - 02/07/2014 Chemotherapy Bendamustine/Rituxan x 6 cycles with Neulasta support   05/10/2014 -  Chemotherapy maintenance Rituxan 500 mg/m cycle 1    CURRENT THERAPY: Rituxan   INTERVAL HISTORY: [name] 80 y.o. male returns for follow-up of his mantle cell lymphoma.  He is doing well. He has gained back all of his weight and because of his diabetes that frustrates him some.  He denies abdominal pain, nausea, fever, cough or chills.   [name] is here alone today. He is reclining in the bed getting prepped to have blood drawn.  He says he "had a little cold, I think" this past month.  [name] reports that his bowels have been great, and his appetite has also been great. He says his weight is "too good" at this point.  He says he sweats at night, and confirms that he gets up and changes his clothes and sometimes even his sheets. He says the last time it happened was yesterday morning, and that it does tend to happen fairly often, on average at least once a week. He says "it was pretty bad yesterday." He also confirms that 3 months ago it wasn't as bad as it is now.  He went to see Dr. Laural Golden this week, and reported testicle problems. He remarked that Dr. Laural Golden wants him to get another colonoscopy once he's seen a urologist.. [name] comments that his testicles were sore and swollen a little, so after his appointment with Dr. Laural Golden, he went this week to see urologist Dr. Odis Luster in [place]. Dr. Odis Luster prescribed him an  antibiotic.  MEDICAL HISTORY: Past Medical History  Diagnosis Date  . Type 2 diabetes mellitus ([place])   . 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 Mixed hyperlipidemia   . PVD (peripheral vascular disease) ([place])   . Essential hypertension, benign   . Prostate cancer ([place])   . Colon cancer ([place])   . B12 deficiency 02/07/2014  . Obstructive sleep apnea     does not use  . GERD (gastroesophageal reflux disease)     has Mixed hyperlipidemia; HYPERTENSION, BENIGN; Coronary atherosclerosis of native coronary artery; VASOMOTOR RHINITIS; GERD; OSA (obstructive sleep apnea); H/O ulcerative colitis; Mantle cell lymphoma ([place]); Prostate cancer ([place]); Proctitis, radiation; Abdominal pain, left lower quadrant; Abdominal pain, unspecified site; Lower GI bleed; Diverticulitis; Leukopenia; Diverticulosis of colon with hemorrhage; Thrombocytopenia, unspecified ([place]); Acute blood loss anemia; and B12 deficiency on his problem list.     is allergic to bee venom; adhesive; ciprofloxacin; codeine; iodine; latex; povidone-iodine; and simvastatin.  [name] does not currently have medications on file.  SURGICAL HISTORY: Past Surgical History  Procedure Laterality Date  . Tonsillectomy    . Back surgery    . Vasectomy    . Knee surgery Right     total knee  . Shoulder surgery    . Prostatectomy    . Colonoscopy with esophagogastroduodenoscopy (egd) N/A 08/27/2013    Procedure: COLONOSCOPY WITH  ESOPHAGOGASTRODUODENOSCOPY (EGD);  Surgeon: Rogene Houston, [name];  [facility];  Service: Endoscopy;  Laterality: N/A;  730  . Balloon dilation N/A 08/27/2013    Procedure: BALLOON DILATION;  Surgeon: Rogene Houston, [name];  [facility];  Service: Endoscopy;  Laterality: N/A;  Venia Minks dilation N/A 08/27/2013    Procedure: Venia Minks DILATION;  Surgeon: Rogene Houston, [name];  [facility];   Service: Endoscopy;  Laterality: N/A;  . Savory dilation N/A 08/27/2013    Procedure: SAVORY DILATION;  Surgeon: Rogene Houston, [name];  [facility];  Service: Endoscopy;  Laterality: N/A;  . Portacath placement Right 2014  . Colonoscopy N/A 12/17/2013    Procedure: COLONOSCOPY;  Surgeon: Rogene Houston, [name];  [facility];  Service: Endoscopy;  Laterality: N/A;  940  . Colon surgery    . Knee arthroscopy Left   . Removal of port Right   . Colonoscopy N/A 02/28/2014    Procedure: COLONOSCOPY;  Surgeon: Rogene Houston, [name];  [facility];  Service: Endoscopy;  Laterality: N/A;  730    SOCIAL HISTORY: Social History   Social History  . Marital Status: Married    Spouse Name: N/A  . Number of Children: N/A  . Years of Education: N/A   Occupational History  . retired    Social History Main Topics  . Smoking status: Never Smoker   . Smokeless tobacco: Never Used  . Alcohol Use: No  . Drug Use: No  . Sexual Activity: Yes    Birth Control/ Protection: None   Other Topics Concern  . Not on file   Social History Narrative    FAMILY HISTORY: Family History  Problem Relation Age of Onset  . Colon cancer Brother   . Cancer Brother   . Diabetes Father   . Cancer Brother     Review of Systems  Constitutional: Positive for appetite loss and weight gain.  HENT: Negative.   Eyes: Negative.   Respiratory: Negative.       Chronic cough. Cardiovascular: Negative.   Gastrointestinal: Negative.   Genitourinary: Negative.   Musculoskeletal: Negative.   Skin: Negative.   Neurological: Positive for tremors. Negative for dizziness, tingling, sensory change, speech change, focal weakness, seizures and loss of consciousness.      Tremor in hands and arms. Endo/Heme/Allergies: Negative.   Psychiatric/Behavioral: Negative.   14 point review of systems was performed and is negative except as detailed under history of present illness and above   PHYSICAL  EXAMINATION  ECOG PERFORMANCE STATUS: 1 - Symptomatic but completely ambulatory  There were no vitals filed for this visit.  Physical Exam  Constitutional: He is oriented to person, place, and time and well-developed, well-nourished, and in no distress.  HENT:  Head: Normocephalic and atraumatic.  Nose: Nose normal.  Mouth/Throat: Oropharynx is clear and moist. No oropharyngeal exudate.  Eyes: Conjunctivae and EOM are normal. Pupils are equal, round, and reactive to light. Right eye exhibits no discharge. Left eye exhibits no discharge. No scleral icterus.  Neck: Normal range of motion. Neck supple. No tracheal deviation present. No thyromegaly present.  Cardiovascular: Normal rate, regular rhythm and normal heart sounds.  Exam reveals no gallop and no friction rub.   No murmur heard. Pulmonary/Chest: Effort normal and breath sounds normal. He has no wheezes. He has no rales.  Abdominal: Soft. Bowel sounds are normal. He exhibits no distension and no mass. There is no tenderness.  There is no rebound and no guarding.  Musculoskeletal: Normal range of motion. He exhibits no edema.  Lymphadenopathy:       Head (right side): No submental and no submandibular adenopathy present.       Head (left side): No submental and no submandibular adenopathy present.    He has no cervical adenopathy.       Right axillary: No pectoral and no lateral adenopathy present.       Left axillary: No pectoral and no lateral adenopathy present.      Right: No inguinal and no supraclavicular adenopathy present.       Left: No inguinal and no supraclavicular adenopathy present.  Neurological: He is alert and oriented to person, place, and time. He has normal reflexes. No cranial nerve deficit. Gait normal. Coordination normal.  Skin: Skin is warm and dry. No rash noted.  Psychiatric: Mood, memory, affect and judgment normal.  Nursing note and vitals reviewed.  LABORATORY DATA: I have reviewed the data as  listed  CBC    Component Value Date/Time   WBC 3.4* 06/30/2015 0929   RBC 3.42* 06/30/2015 0929   RBC 3.55* 08/02/2014 0825   HGB 10.9* 06/30/2015 0929   HCT 33.1* 06/30/2015 0929   PLT 88* 06/30/2015 0929   MCV 96.8 06/30/2015 0929   MCH 31.9 06/30/2015 0929   MCHC 32.9 06/30/2015 0929   RDW 15.1 06/30/2015 0929   LYMPHSABS 0.9 06/30/2015 0929   MONOABS 0.2 06/30/2015 0929   EOSABS 0.1 06/30/2015 0929   BASOSABS 0.0 06/30/2015 0929   CMP     Component Value Date/Time   NA 138 06/30/2015 0929   K 6.1* 06/30/2015 0929   CL 107 06/30/2015 0929   CO2 23 06/30/2015 0929   GLUCOSE 209* 06/30/2015 0929   BUN 44* 06/30/2015 0929   CREATININE 2.48* 06/30/2015 0929   CREATININE 1.17 11/28/2014 0955   CALCIUM 9.0 06/30/2015 0929   PROT 6.5 06/30/2015 0929   ALBUMIN 4.1 06/30/2015 0929   AST 19 06/30/2015 0929   ALT 21 06/30/2015 0929   ALKPHOS 43 06/30/2015 0929   BILITOT 0.5 06/30/2015 0929   GFRNONAA 23* 06/30/2015 0929   GFRAA 27* 06/30/2015 0929   RADIOLOGY:  CLINICAL DATA: 80 year old male with history of colon cancer diagnosed 2014 status post chemotherapy. History prostate cancer status post surgical resection and radiation therapy.  EXAM: CT CHEST, ABDOMEN AND PELVIS WITHOUT CONTRAST IMPRESSION: 1. No definite findings to suggest metastatic disease in the chest, abdomen or pelvis. 2. There are several new patchy areas of ground-glass attenuation in the lungs bilaterally which are highly nonspecific. The possibility of developing interstitial lung disease is not excluded, and repeat high-resolution chest CT is suggested in 6-12 months to assess for temporal changes in the appearance of the lung parenchyma. 3. Atherosclerosis, including 2 vessel coronary artery disease. Assessment for potential risk factor modification, dietary therapy or pharmacologic therapy may be warranted, if clinically indicated. 4. Cholelithiasis without evidence of acute cholecystitis  at this time. 5. Colonic diverticulosis without evidence of acute diverticulitis at this time. 6. Additional incidental findings, as above.   Electronically Signed  By: Vinnie Langton M.D.  On: 05/11/2015 14:11   ASSESSMENT and THERAPY PLAN:  Mantle cell lymphoma Chronic fatigue Tremor Pancytopenia  He will continue with maintenance Rituxan. His appetite is good and he denies abdominal pain, or change in appetite. He is having night sweats where he has to change his bed clothes and sheets, he reports these at  least once a week. No fever, no cough.  I reviewed his CT scans. We will repeat his CT chest in several months.  He has seen a urologist and is on an antibiotic. I will re-schedule f/u with Dr. Laural Golden for consideration of repeat C scope.  His pancytopenia is chronic and stable. We will continue with observation.   We will try to get copies of his recent neurology evaluation.  I educated [name] about the flu, and that the majority of patient's on Rituxan do not develop a significant response to the vaccine. We discussed indications for tamiflu.   All questions were answered. The patient knows to call the clinic with any problems, questions or concerns. We can certainly see the patient much sooner if necessary.  This document serves as a record of services personally performed by Ancil Linsey, [name]. It was created on her behalf by Toni Amend, a trained medical scribe. The creation of this record is based on the scribe's personal observations and the provider's statements to them. This document has been checked and approved by the attending provider.  I have reviewed the above documentation for accuracy and completeness, and I agree with the above.  This note was electronically signed.  Ancil Linsey, [name]

## 2015-06-30 NOTE — ED Notes (Signed)
PVR 35 ml. Patient voided 200 ml prior to scan.

## 2015-06-30 NOTE — ED Provider Notes (Signed)
CSN: [id number]     Arrival date & time 06/30/15  1427 History   First MD Initiated Contact with Patient 06/30/15 1433     Chief Complaint  Patient presents with  . Abnormal Lab     (Consider location/radiation/quality/duration/timing/severity/associated sxs/prior Treatment) HPI  80-year-old male with a history of mantle cell lymphoma presents from the oncology center with acute kidney injury with hyperkalemia. He was getting his Rituxan and had basic labs and was found to have a creatinine of 2.4 with a potassium of 6.1. He was given 30 g of Kayexalate and then sent here for further evaluation. Patient denies any history of fevers, chest pain, shortness of breath, or abdominal pain. No vomiting or diarrhea. Last week he had dysuria but for over one week he has not had any urinary symptoms. No changes in diet or eating/drinking less.  Past Medical History  Diagnosis Date  . Type 2 diabetes mellitus (HCC)   . 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 Mixed hyperlipidemia   . PVD (peripheral vascular disease) (HCC)   . Essential hypertension, benign   . Prostate cancer (HCC)   . Colon cancer (HCC)   . B12 deficiency 02/07/2014  . Obstructive sleep apnea     does not use  . GERD (gastroesophageal reflux disease)    Past Surgical History  Procedure Laterality Date  . Tonsillectomy    . Back surgery    . Vasectomy    . Knee surgery Right     total knee  . Shoulder surgery    . Prostatectomy    . Colonoscopy with esophagogastroduodenoscopy (egd) N/A 08/27/2013    Procedure: COLONOSCOPY WITH ESOPHAGOGASTRODUODENOSCOPY (EGD);  Surgeon: Najeeb U Rehman, MD;  [facility];  Service: Endoscopy;  Laterality: N/A;  730  . Balloon dilation N/A 08/27/2013    Procedure: BALLOON DILATION;  Surgeon: Najeeb U Rehman, MD;  [facility];  Service: Endoscopy;  Laterality: N/A;  . Maloney  dilation N/A 08/27/2013    Procedure: MALONEY DILATION;  Surgeon: Najeeb U Rehman, MD;  [facility];  Service: Endoscopy;  Laterality: N/A;  . Savory dilation N/A 08/27/2013    Procedure: SAVORY DILATION;  Surgeon: Najeeb U Rehman, MD;  [facility];  Service: Endoscopy;  Laterality: N/A;  . Portacath placement Right 2014  . Colonoscopy N/A 12/17/2013    Procedure: COLONOSCOPY;  Surgeon: Najeeb U Rehman, MD;  [facility];  Service: Endoscopy;  Laterality: N/A;  940  . Colon surgery    . Knee arthroscopy Left   . Removal of port Right   . Colonoscopy N/A 02/28/2014    Procedure: COLONOSCOPY;  Surgeon: Najeeb U Rehman, MD;  [facility];  Service: Endoscopy;  Laterality: N/A;  730   Family History  Problem Relation Age of Onset  . Colon cancer Brother   . Cancer Brother   . Diabetes Father   . Cancer Brother    Social History  Substance Use Topics  . Smoking status: Never Smoker   . Smokeless tobacco: Never Used  . Alcohol Use: No    Review of Systems  Constitutional: Negative for fever.  Respiratory: Negative for shortness of breath.   Cardiovascular: Negative for chest pain.  Gastrointestinal: Negative for vomiting, abdominal pain and diarrhea.  Genitourinary: Positive for dysuria (1 week ago, none now) and frequency.  Neurological: Negative   for weakness.  All other systems reviewed and are negative.     Allergies  Bee venom; Adhesive; Ciprofloxacin; Codeine; Iodine; Latex; Povidone-iodine; and Simvastatin  Home Medications   Prior to Admission medications   Medication Sig Start Date End Date Taking? Authorizing Provider  ACCU-CHEK AVIVA PLUS test strip  01/06/15   Historical Provider, MD  acetaminophen (TYLENOL) 325 MG tablet Take 650 mg by mouth every 4 (four) hours as needed for mild pain or moderate pain. Take 2 tablets 1 hour prior to Rituxan treatment.    Historical Provider, MD  acyclovir (ZOVIRAX) 400 MG tablet Take 1  tablet (400 mg total) by mouth 2 (two) times daily. 10/31/14   Shannon K Penland, MD  amLODipine (NORVASC) 5 MG tablet Take 5 mg by mouth daily with breakfast. 12/24/12   Thomas D Stuckey, MD  atorvastatin (LIPITOR) 40 MG tablet  04/19/15   Historical Provider, MD  carbidopa-levodopa (SINEMET IR) 25-100 MG per tablet  04/17/15   Historical Provider, MD  Cyanocobalamin (VITAMIN B-12 IJ) Inject as directed every 30 (thirty) days.     Historical Provider, MD  diazepam (VALIUM) 5 MG tablet Take one tablet at bedtime if needed for sleep. 08/02/14   Gregory Formanek, MD  diphenhydrAMINE (BENADRYL) 25 MG tablet Take 25 mg by mouth daily as needed. Take 2 tablets 1 hour prior to Rituxan treatment.    Historical Provider, MD  escitalopram (LEXAPRO) 20 MG tablet Take one tablet daily at bedtime 05/11/14   Gregory Formanek, MD  hydrocortisone-pramoxine (PROCTOFOAM HC) rectal foam Place 1 applicator rectally 2 (two) times daily. 06/20/15   Terri L Setzer, NP  insulin glargine (LANTUS SOLOSTAR) 100 UNIT/ML injection Inject 50 Units into the skin every morning.     Historical Provider, MD  Lancets (ACCU-CHEK MULTICLIX) lancets  01/06/15   Historical Provider, MD  LANTUS SOLOSTAR 100 UNIT/ML Solostar Pen  04/19/15   Historical Provider, MD  levocetirizine (XYZAL) 5 MG tablet Take 5 mg by mouth every evening.    Historical Provider, MD  lidocaine-prilocaine (EMLA) cream Apply 1 application topically daily as needed (chemo). Apply a quarter size amount to port site 1 hour prior to chemo. Do not rub in. Cover with plastic wrap. 09/08/13   Gregory Formanek, MD  lisinopril (PRINIVIL,ZESTRIL) 40 MG tablet Take 40 mg by mouth daily with breakfast. 12/24/12   Thomas D Stuckey, MD  LORazepam (ATIVAN) 0.5 MG tablet Take one tablet every 4 hours as needed for nausea. 07/07/14   Thomas S Kefalas, PA-C  metFORMIN (GLUCOPHAGE) 1000 MG tablet Take 1,000 mg by mouth 2 (two) times daily with a meal.     Historical Provider, MD  metoCLOPramide  (REGLAN) 10 MG tablet Take one or two tablets 4 times a day before meals and at bedtime as needed for nausea. 08/02/14   Gregory Formanek, MD  mupirocin ointment (BACTROBAN) 2 %  02/21/14   Historical Provider, MD  pantoprazole (PROTONIX) 40 MG tablet Take 1 tablet (40 mg total) by mouth daily. 11/28/14   Terri L Setzer, NP  pantoprazole (PROTONIX) 40 MG tablet Take 1 tablet (40 mg total) by mouth daily. 11/28/14   Terri L Setzer, NP  prochlorperazine (COMPAZINE) 10 MG tablet Take 1 tablet (10 mg total) by mouth every 6 (six) hours as needed for nausea or vomiting. 09/08/13   Gregory Formanek, MD  sulfacetamide (BLEPH-10) 10 % ophthalmic solution  04/24/15   Historical Provider, MD  sulfamethoxazole-trimethoprim (BACTRIM DS,SEPTRA DS) 800-160 MG tablet Take 1 tablet by   mouth.  06/22/15   Historical Provider, MD  traMADol (ULTRAM) 50 MG tablet Take 1 tablet (50 mg total) by mouth every 6 (six) hours as needed. 11/16/13   Rona Ravens Setzer, NP   BP 127/85 mmHg  Pulse 63  Temp(Src) 97.7 F (36.5 C) (Oral)  Resp 18  Ht 5' 11" (1.803 m)  Wt 232 lb (105.235 kg)  BMI 32.37 kg/m2  SpO2 96% Physical Exam  Constitutional: He is oriented to person, place, and time. He appears well-developed and well-nourished.  HENT:  Head: Normocephalic and atraumatic.  Right Ear: External ear normal.  Left Ear: External ear normal.  Nose: Nose normal.  Mouth/Throat: Oropharynx is clear and moist.  Eyes: Right eye exhibits no discharge. Left eye exhibits no discharge.  Neck: Neck supple.  Cardiovascular: Normal rate, regular rhythm, normal heart sounds and intact distal pulses.   Pulmonary/Chest: Effort normal and breath sounds normal.  Abdominal: Soft. He exhibits no distension. There is no tenderness.  Musculoskeletal: He exhibits no edema.  Neurological: He is alert and oriented to person, place, and time.  Skin: Skin is warm and dry.  Nursing note and vitals reviewed.   ED Course  Procedures (including critical  care time) Labs Review Labs Reviewed  URINALYSIS, ROUTINE W REFLEX MICROSCOPIC (NOT AT [facility])   Results for GUILFORD, SHANNAHAN (MRN [id number]) as of 06/30/2015 15:18  Ref. Range 06/30/2015 09:29  Sodium Latest Ref Range: 135-145 mmol/L 138  Potassium Latest Ref Range: 3.5-5.1 mmol/L 6.1 (HH)  Chloride Latest Ref Range: 101-111 mmol/L 107  CO2 Latest Ref Range: 22-32 mmol/L 23  BUN Latest Ref Range: 6-20 mg/dL 44 (H)  Creatinine Latest Ref Range: 0.61-1.24 mg/dL 2.48 (H)  Calcium Latest Ref Range: 8.9-10.3 mg/dL 9.0  EGFR (Non-African Amer.) Latest Ref Range: >60 mL/min 23 (L)  EGFR (African American) Latest Ref Range: >60 mL/min 27 (L)  Glucose Latest Ref Range: 65-99 mg/dL 209 (H)  Anion gap Latest Ref Range: 5-15  8  Alkaline Phosphatase Latest Ref Range: 38-126 U/L 43  Albumin Latest Ref Range: 3.5-5.0 g/dL 4.1  AST Latest Ref Range: 15-41 U/L 19  ALT Latest Ref Range: 17-63 U/L 21  Total Protein Latest Ref Range: 6.5-8.1 g/dL 6.5  Total Bilirubin Latest Ref Range: 0.3-1.2 mg/dL 0.5   Imaging Review No results found. I have personally reviewed and evaluated these images and lab results as part of my medical decision-making.   EKG Interpretation   Date/Time:  Friday June 30 2015 14:42:42 EDT Ventricular Rate:  61 PR Interval:  157 QRS Duration: 95 QT Interval:  395 QTC Calculation: 398 R Axis:   -3 Text Interpretation:  Sinus rhythm Low voltage, extremity and precordial  leads no significant change since April 2015 Confirmed by Regenia Skeeter  MD,  Emannuel Vise ([id number]) on 06/30/2015 3:04:47 PM      MDM   Final diagnoses:  Acute kidney injury ([place])  Hyperkalemia    Patient with acute kidney injury, reason is unclear. Could be related to his oncology treatments. Given IV fluids here. No EKG changes with hyperkalemia. Will need admission for telemetry monitoring and workup of AKI. No fevers or respiratory symptoms.     Sherwood Gambler, MD 06/30/15 [phone]

## 2015-06-30 NOTE — ED Notes (Signed)
Attempted to call report. RN to call back. 

## 2015-06-30 NOTE — Progress Notes (Signed)
Please see rituxan encounter for more information 

## 2015-06-30 NOTE — ED Notes (Signed)
Patient from [facility] for abnormal labs. Creatinine 2.4, BUN 44, potassium 6.1. 30 G Kayexalate given PTA by RN from [facility]. Patient alert/oriented x 4. No complaints.

## 2015-06-30 NOTE — Progress Notes (Signed)
..  [name] presents today for injection per the provider's orders.  Vit b12 administration without incident; see MAR for injection details.  Patient tolerated procedure well and without incident.  No questions or complaints noted at this time.  Marland KitchenEarnest [name] presents today for injection per the provider's orders.  Flu vaccine administration without incident; see MAR for injection details.  Patient tolerated procedure well and without incident.  No questions or complaints noted at this time.  Marland KitchenEarnest [name] will be taken to ED upon d/c today for work up re: kidney failure. Transported to ED and report to [employer].

## 2015-07-01 DIAGNOSIS — N179 Acute kidney failure, unspecified: Secondary | ICD-10-CM | POA: Diagnosis not present

## 2015-07-01 DIAGNOSIS — D696 Thrombocytopenia, unspecified: Secondary | ICD-10-CM | POA: Diagnosis not present

## 2015-07-01 DIAGNOSIS — E875 Hyperkalemia: Secondary | ICD-10-CM | POA: Diagnosis not present

## 2015-07-01 DIAGNOSIS — I10 Essential (primary) hypertension: Secondary | ICD-10-CM | POA: Diagnosis not present

## 2015-07-01 DIAGNOSIS — C8310 Mantle cell lymphoma, unspecified site: Secondary | ICD-10-CM | POA: Diagnosis not present

## 2015-07-01 LAB — BASIC METABOLIC PANEL
Anion gap: 5 (ref 5–15)
BUN: 28 mg/dL — AB (ref 6–20)
CALCIUM: 8.6 mg/dL — AB (ref 8.9–10.3)
CO2: 25 mmol/L (ref 22–32)
CREATININE: 1.91 mg/dL — AB (ref 0.61–1.24)
Chloride: 108 mmol/L (ref 101–111)
GFR, EST AFRICAN AMERICAN: 37 mL/min — AB (ref >60)
GFR, EST NON AFRICAN AMERICAN: 32 mL/min — AB (ref >60)
Glucose, Bld: 126 mg/dL — ABNORMAL HIGH (ref 65–99)
Potassium: 5 mmol/L (ref 3.5–5.1)
SODIUM: 138 mmol/L (ref 135–145)

## 2015-07-01 LAB — COMPREHENSIVE METABOLIC PANEL
ALK PHOS: 38 U/L (ref 38–126)
ALT: 7 U/L — ABNORMAL LOW (ref 17–63)
ANION GAP: 4 — AB (ref 5–15)
AST: 17 U/L (ref 15–41)
Albumin: 3.8 g/dL (ref 3.5–5.0)
BUN: 29 mg/dL — ABNORMAL HIGH (ref 6–20)
CALCIUM: 8.6 mg/dL — AB (ref 8.9–10.3)
CHLORIDE: 110 mmol/L (ref 101–111)
CO2: 25 mmol/L (ref 22–32)
Creatinine, Ser: 1.98 mg/dL — ABNORMAL HIGH (ref 0.61–1.24)
GFR, EST AFRICAN AMERICAN: 36 mL/min — AB (ref >60)
GFR, EST NON AFRICAN AMERICAN: 31 mL/min — AB (ref >60)
Glucose, Bld: 110 mg/dL — ABNORMAL HIGH (ref 65–99)
Potassium: 5.5 mmol/L — ABNORMAL HIGH (ref 3.5–5.1)
SODIUM: 139 mmol/L (ref 135–145)
Total Bilirubin: 0.5 mg/dL (ref 0.3–1.2)
Total Protein: 6.2 g/dL — ABNORMAL LOW (ref 6.5–8.1)

## 2015-07-01 LAB — CBC
HCT: 34.2 % — ABNORMAL LOW (ref 39.0–52.0)
HEMOGLOBIN: 11 g/dL — AB (ref 13.0–17.0)
MCH: 31.3 pg (ref 26.0–34.0)
MCHC: 32.2 g/dL (ref 30.0–36.0)
MCV: 97.4 fL (ref 78.0–100.0)
PLATELETS: 76 10*3/uL — AB (ref 150–400)
RBC: 3.51 MIL/uL — AB (ref 4.22–5.81)
RDW: 15 % (ref 11.5–15.5)
WBC: 2.8 10*3/uL — AB (ref 4.0–10.5)

## 2015-07-01 LAB — GLUCOSE, CAPILLARY
GLUCOSE-CAPILLARY: 125 mg/dL — AB (ref 65–99)
GLUCOSE-CAPILLARY: 129 mg/dL — AB (ref 65–99)
GLUCOSE-CAPILLARY: 120 mg/dL — AB (ref 65–99)
Glucose-Capillary: 111 mg/dL — ABNORMAL HIGH (ref 65–99)

## 2015-07-01 MED ORDER — PNEUMOCOCCAL VAC POLYVALENT 25 MCG/0.5ML IJ INJ
0.5000 mL | INJECTION | INTRAMUSCULAR | Status: DC
  Filled 2015-07-01: qty 0.5

## 2015-07-01 NOTE — Progress Notes (Signed)
[facility] PROGRESS NOTE  [name] DJS:[id number] DOB: Jan 13, 1935 DOA: 06/30/2015 PCP: Brandon Neighbors, MD  Assessment/Plan: 1. AKI, improving. Likely related to recent Bactrim use. Continue IVFs and monitor. 2. Hyperkalemia, improving with Kayexalate. EKG unremarkable. Continue to monitor. 3. Essential hypertension, stable. Discontinued ACE inhibitor in the setting of AKI/hyperkalemia. Blood pressure is currently stable 4. Mantle cell lymphoma. Follow-up with oncology for further management 5. DM type 2, stable. Continue SSI. CBGs in acceptable range. 6. Thrombocytopenia, chronic. Stable.   Code Status: Full code. DVT Prophylaxis: SCDs Family Communication: No family at bedside. Discussed with patient who understands and has no concerns at this time. Disposition Plan: Anticipate discharge within 24 hours.    Consultants:  none  Procedures:  none  Antibiotics:  none  HPI/Subjective: Feels good. Denies any pain, SOB, nausea, or vomiting.   Objective: Filed Vitals:   07/01/15 0631  BP: 128/82  Pulse: 74  Temp: 97.7 F (36.5 C)  Resp: 18    Intake/Output Summary (Last 24 hours) at 07/01/15 0730 Last data filed at 06/30/15 1855  Gross per 24 hour  Intake    240 ml  Output      0 ml  Net    240 ml   Filed Weights   06/30/15 1433  Weight: 105.235 kg (232 lb)    Exam:  General: NAD, looks comfortable Cardiovascular: RRR, S1, S2  Respiratory: clear bilaterally, No wheezing, rales or rhonchi Abdomen: soft, non tender, no distention , bowel sounds normal Musculoskeletal: No edema b/l   Data Reviewed: Basic Metabolic Panel:  Recent Labs Lab 06/30/15 0929 07/01/15 0618  NA 138 139  K 6.1* 5.5*  CL 107 110  CO2 23 25  GLUCOSE 209* 110*  BUN 44* 29*  CREATININE 2.48* 1.98*  CALCIUM 9.0 8.6*   Liver Function Tests:  Recent Labs Lab 06/30/15 0929 07/01/15 0618  AST 19 17  ALT 21 7*  ALKPHOS 43 38  BILITOT 0.5 0.5  PROT 6.5 6.2*   ALBUMIN 4.1 3.8   CBC:  Recent Labs Lab 06/30/15 0929 07/01/15 0618  WBC 3.4* 2.8*  NEUTROABS 2.1  --   HGB 10.9* 11.0*  HCT 33.1* 34.2*  MCV 96.8 97.4  PLT 88* 76*    Scheduled Meds: . amLODipine  5 mg Oral Q breakfast  . carbidopa-levodopa  1 tablet Oral QID  . hydrocortisone-pramoxine  1 applicator Rectal BID  . insulin aspart  0-5 Units Subcutaneous QHS  . insulin aspart  0-9 Units Subcutaneous TID WC  . insulin glargine  50 Units Subcutaneous [id number]  . pantoprazole  40 mg Oral Daily  . sodium chloride  3 mL Intravenous Q12H   Continuous Infusions: . sodium chloride 125 mL/hr at 07/01/15 0153    Active Problems:   HYPERTENSION, BENIGN   Mantle cell lymphoma (HCC)   Thrombocytopenia, unspecified ([place])   AKI (acute kidney injury) ([place])   Acute kidney injury ([place])    Time spent: 25 minutes    [name]. MD [facility] Pager [phone]. If 7PM-7AM, please contact night-coverage at [email or web address], password [facility] 07/01/2015, 7:30 AM

## 2015-07-02 DIAGNOSIS — N183 Chronic kidney disease, stage 3 (moderate): Secondary | ICD-10-CM | POA: Diagnosis not present

## 2015-07-02 DIAGNOSIS — N1832 Chronic kidney disease, stage 3b: Secondary | ICD-10-CM | POA: Diagnosis present

## 2015-07-02 DIAGNOSIS — I10 Essential (primary) hypertension: Secondary | ICD-10-CM | POA: Diagnosis not present

## 2015-07-02 DIAGNOSIS — C8310 Mantle cell lymphoma, unspecified site: Secondary | ICD-10-CM | POA: Diagnosis not present

## 2015-07-02 DIAGNOSIS — N179 Acute kidney failure, unspecified: Secondary | ICD-10-CM | POA: Diagnosis not present

## 2015-07-02 DIAGNOSIS — E875 Hyperkalemia: Secondary | ICD-10-CM | POA: Diagnosis not present

## 2015-07-02 LAB — BASIC METABOLIC PANEL
Anion gap: 7 (ref 5–15)
BUN: 23 mg/dL — AB (ref 6–20)
CHLORIDE: 108 mmol/L (ref 101–111)
CO2: 25 mmol/L (ref 22–32)
CREATININE: 1.54 mg/dL — AB (ref 0.61–1.24)
Calcium: 8.8 mg/dL — ABNORMAL LOW (ref 8.9–10.3)
GFR, EST AFRICAN AMERICAN: 48 mL/min — AB (ref >60)
GFR, EST NON AFRICAN AMERICAN: 42 mL/min — AB (ref >60)
Glucose, Bld: 115 mg/dL — ABNORMAL HIGH (ref 65–99)
Potassium: 4.6 mmol/L (ref 3.5–5.1)
SODIUM: 140 mmol/L (ref 135–145)

## 2015-07-02 LAB — GLUCOSE, CAPILLARY
GLUCOSE-CAPILLARY: 139 mg/dL — AB (ref 65–99)
GLUCOSE-CAPILLARY: 107 mg/dL — AB (ref 65–99)

## 2015-07-02 MED ORDER — METHOCARBAMOL 500 MG PO TABS: 500.0000 mg | ORAL_TABLET | Freq: Four times a day (QID) | ORAL | Status: DC | PRN

## 2015-07-02 MED ORDER — CEPHALEXIN 500 MG PO CAPS: 500.0000 mg | ORAL_CAPSULE | Freq: Three times a day (TID) | ORAL | Status: DC

## 2015-07-02 MED ORDER — HEPARIN SOD (PORK) LOCK FLUSH 100 UNIT/ML IV SOLN
500.0000 [IU] | INTRAVENOUS | Status: AC | PRN
  Administered 2015-07-02: 500 [IU]
  Filled 2015-07-02: qty 5

## 2015-07-02 NOTE — Progress Notes (Signed)
Patient discharged home.  Port flushed with heparin and deaccessed. Reviewed medications and instructions to follow up with PCP and Onc.  Verbalizes understanding.  No questions at this time, stable to DC.  Assisted off unit via [place] with NT assist

## 2015-07-02 NOTE — Discharge Summary (Signed)
Physician Discharge Summary  [name] UMP:[id number] DOB: May 05, 1935 DOA: 06/30/2015  PCP: Wende Neighbors, MD  Admit date: 06/30/2015 Discharge date: 07/02/2015  Time spent: 35 minutes  Recommendations for Outpatient Follow-up:   Follow up with PCP in 1-2 weeks.  Follow up with Oncologist as planned.  Lisinopril and Bactrim has been discontinued. Consider resuming Lisinopril on follow-up with PCP if renal functions remain stable.    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 Patient was seen in the oncology clinic on 10/7 and had lab work that revealed AKI and hyperkalemia. He denies any associated symptoms. Admitted for further management.    Hospital Course:  80 y/o male presented with abnormal labs. Labs revealed AKI on CHD Stage III likely related to recent Bactrim, use which was prescribed for UTI. Patient was also taking ACE inhibitors, both bactrim and ACE-I were discontinued on admission. AKI improved significantly with IVF. He was also noted to be hyperkalemic, which resolved with kayexalate/IVF. Patient denied any associated symptoms and had an unremarkable EKG. He was afebrile and did not show any signs of infection throughout course of admission. He maintained good urine output during hospital course. He will follow-up with PCP in 1-2 weeks. Can consider resuming ACE-I on follow-up with PCP. For his recent UTI, he has allergies to Cipro so we will discharge with  Keflex for resolution of UTI.  1.  Essential hypertension, stable. Discontinued ACE inhibitor in the setting of AKI. Instructed to resume after PCP follow-up. 2. Mantle cell lymphoma. On chemotherapy with Rituxan. Will follow up with is oncologist as planned.  3. DM type 2, remained stable. Started on SSI, CBGs remained in acceptable range. Home insulins held in the setting of AKI.  4. Thrombocytopenia, chronic. Remained stable.  Procedures:  None  Consultations:  Oncology   Discharge Exam: Filed Vitals:   07/02/15 0551  BP: 136/85  Pulse: 71  Temp: 98.7 F (37.1 C)  Resp: 18     General: NAD, looks comfortable. Sitting up on the edge of the bed.   Cardiovascular: RRR, S1, S2   Respiratory: clear bilaterally, No wheezing, rales or rhonchi  Abdomen: soft, non tender, no distention , bowel sounds normal  Musculoskeletal: No edema b/l  Discharge Instructions   Discharge Instructions    Diet - low sodium heart healthy    Complete by:  As directed      Increase activity slowly    Complete by:  As directed           Current Discharge Medication List    START taking these medications   Details  cephALEXin (KEFLEX) 500 MG capsule Take 1 capsule (500 mg total) by mouth 3 (three) times daily. Qty: 15 capsule, Refills: 0    methocarbamol (ROBAXIN) 500 MG tablet Take 1 tablet (500 mg total) by mouth every 6 (six) hours as needed for muscle spasms. Qty: 30 tablet, Refills: 0      CONTINUE these medications which have NOT  CHANGED   Details  acetaminophen (TYLENOL) 325 MG tablet Take 650 mg by mouth every 4 (four) hours as needed for mild pain or moderate pain. Take 2 tablets 1 hour prior to Rituxan treatment.   Associated Diagnoses: OSA (obstructive sleep apnea); H/O ulcerative colitis; Mantle cell lymphoma ([place]); Prostate cancer ([place])    amLODipine (NORVASC) 5 MG tablet Take 5 mg by mouth daily with breakfast.    atorvastatin (LIPITOR) 40 MG tablet Take 40 mg by  mouth daily at 6 PM.    Associated Diagnoses: Mantle cell lymphoma (HCC)    carbidopa-levodopa (SINEMET IR) 25-100 MG per tablet Take 1 tablet by mouth 4 (four) times daily.    Associated Diagnoses: Mantle cell lymphoma (HCC)    Cyanocobalamin (VITAMIN B-12 IJ) Inject as directed every 30 (thirty) days.     diazepam (VALIUM) 5 MG tablet Take one tablet at bedtime if needed for sleep. Qty: 90 tablet, Refills: 0    diphenhydrAMINE (BENADRYL) 25 MG tablet Take 25 mg by mouth daily as needed. Take 2 tablets 1 hour prior to Rituxan treatment.   Associated Diagnoses: OSA (obstructive sleep apnea); H/O ulcerative colitis; Mantle cell lymphoma ([place]); Prostate cancer ([place])    hydrocortisone-pramoxine (PROCTOFOAM HC) rectal foam Place 1 applicator rectally 2 (two) times daily. Qty: 10 g, Refills: 0   Associated Diagnoses: Rectal pain    insulin glargine (LANTUS SOLOSTAR) 100 UNIT/ML injection Inject 50 Units into the skin every morning.     lidocaine-prilocaine (EMLA) cream Apply 1 application topically daily as needed (chemo). Apply a quarter size amount to port site 1 hour prior to chemo. Do not rub in. Cover with plastic wrap.    metFORMIN (GLUCOPHAGE) 1000 MG tablet Take 1,000 mg by mouth 2 (two) times daily with a meal.     pantoprazole (PROTONIX) 40 MG tablet Take 1 tablet (40 mg total) by mouth daily. Qty: 30 tablet, Refills: 11   Associated Diagnoses: Gastroesophageal reflux disease without esophagitis    sulfacetamide (BLEPH-10) 10 % ophthalmic solution Place 1 drop into both eyes daily as needed (irritation).    Associated Diagnoses: Mantle cell lymphoma ([place])    escitalopram (LEXAPRO) 20 MG tablet Take one tablet daily at bedtime Qty: 60 tablet, Refills: 6   Associated Diagnoses: Mantle cell lymphoma (HCC)      STOP taking these medications     lisinopril (PRINIVIL,ZESTRIL) 40 MG tablet      sulfamethoxazole-trimethoprim (BACTRIM DS,SEPTRA DS) 800-160 MG tablet       acyclovir (ZOVIRAX) 400 MG tablet      LORazepam (ATIVAN) 0.5 MG tablet      prochlorperazine (COMPAZINE) 10 MG tablet        Allergies  Allergen Reactions  . Bee Venom Anaphylaxis  . Adhesive [Tape] Hives  . Ciprofloxacin     Unknown   . Codeine     Unknown    . Iodine     Unknown    . Latex     Unknown    . Povidone-Iodine     Unknown    . Simvastatin     Unknown        The results of significant diagnostics from this hospitalization (including imaging, microbiology, ancillary and laboratory) are listed below for reference.      Labs: Basic Metabolic Panel:  Recent Labs Lab 06/30/15 0929 07/01/15 0618 07/01/15 1548 07/02/15 0613  NA 138 139 138 140  K 6.1* 5.5* 5.0 4.6  CL 107 110 108 108  CO2 23  25 25 25   GLUCOSE 209* 110* 126* 115*  BUN 44* 29* 28* 23*  CREATININE 2.48* 1.98* 1.91* 1.54*  CALCIUM 9.0 8.6* 8.6* 8.8*   Liver Function Tests:  Recent Labs Lab 06/30/15 0929 07/01/15 0618  AST 19 17  ALT 21 7*  ALKPHOS 43 38  BILITOT 0.5 0.5  PROT 6.5 6.2*  ALBUMIN 4.1 3.8   CBC:  Recent Labs Lab 06/30/15 0929 07/01/15 0618  WBC 3.4* 2.8*  NEUTROABS 2.1  --   HGB 10.9* 11.0*  HCT 33.1* 34.2*  MCV 96.8 97.4  PLT 88* 76*   CBG:  Recent Labs Lab 07/01/15 0732 07/01/15 1127 07/01/15 1653 07/01/15 2140 07/02/15 0751  GLUCAP 111* 129* 120* 139* 107*       Signed:  Jehanzeb Memon. MD  [facility] 07/02/2015, 8:51 AM   By signing my name below, I, Rennis Harding, attest that this documentation has been prepared under the direction and in the presence of Kathie Dike, MD. Electronically signed: Rennis Harding, Scribe. 07/02/2015 8:30 AM.    I, Dr. Kathie Dike, personally performed the services described in this documentaiton. All medical record entries made by the scribe were at my direction and in my presence. I have reviewed the chart and agree that the record reflects my personal performance and is  accurate and complete  Kathie Dike, MD, 07/02/2015 8:55 AM

## 2015-07-07 DIAGNOSIS — N529 Male erectile dysfunction, unspecified: Secondary | ICD-10-CM | POA: Diagnosis not present

## 2015-07-07 DIAGNOSIS — E538 Deficiency of other specified B group vitamins: Secondary | ICD-10-CM | POA: Diagnosis not present

## 2015-07-07 DIAGNOSIS — E291 Testicular hypofunction: Secondary | ICD-10-CM | POA: Diagnosis not present

## 2015-07-07 DIAGNOSIS — N200 Calculus of kidney: Secondary | ICD-10-CM | POA: Diagnosis not present

## 2015-07-07 DIAGNOSIS — N50819 Testicular pain, unspecified: Secondary | ICD-10-CM | POA: Diagnosis not present

## 2015-07-07 DIAGNOSIS — N393 Stress incontinence (female) (male): Secondary | ICD-10-CM | POA: Diagnosis not present

## 2015-07-07 DIAGNOSIS — Z8546 Personal history of malignant neoplasm of prostate: Secondary | ICD-10-CM | POA: Diagnosis not present

## 2015-07-07 DIAGNOSIS — Q6102 Congenital multiple renal cysts: Secondary | ICD-10-CM | POA: Diagnosis not present

## 2015-07-10 ENCOUNTER — Other Ambulatory Visit (INDEPENDENT_AMBULATORY_CARE_PROVIDER_SITE_OTHER): Payer: Self-pay | Admitting: *Deleted

## 2015-07-10 ENCOUNTER — Encounter (INDEPENDENT_AMBULATORY_CARE_PROVIDER_SITE_OTHER): Payer: Self-pay | Admitting: *Deleted

## 2015-07-10 DIAGNOSIS — C8310 Mantle cell lymphoma, unspecified site: Secondary | ICD-10-CM

## 2015-07-14 ENCOUNTER — Encounter (INDEPENDENT_AMBULATORY_CARE_PROVIDER_SITE_OTHER): Payer: Self-pay

## 2015-07-18 ENCOUNTER — Telehealth (INDEPENDENT_AMBULATORY_CARE_PROVIDER_SITE_OTHER): Payer: Self-pay | Admitting: *Deleted

## 2015-07-18 NOTE — Telephone Encounter (Signed)
Referring MD/PCP: hall   Procedure: tcs  Reason/Indication:  Mantle cell lymphoma  Has patient had this procedure before?  Yes, 2015 -- epic  If so, when, by whom and where?    Is there a family history of colon cancer?    Who?  What age when diagnosed?    Is patient diabetic?   yes      Does patient have prosthetic heart valve?  no  Do you have a pacemaker?  no  Has patient ever had endocarditis? no  Has patient had joint replacement within last 12 months?  no  Does patient tend to be constipated or take laxatives? no  Does patient have a history of alcohol/drug use? no  Is patient on Coumadin, Plavix and/or Aspirin? no  Medications: see epic  Allergies: see epic  Medication Adjustment: take 1/2 DM meds day before & hold morning of  Procedure date & time: 08/10/15 at 200

## 2015-07-18 NOTE — Telephone Encounter (Signed)
agree

## 2015-07-21 DIAGNOSIS — N529 Male erectile dysfunction, unspecified: Secondary | ICD-10-CM | POA: Diagnosis not present

## 2015-07-21 DIAGNOSIS — E538 Deficiency of other specified B group vitamins: Secondary | ICD-10-CM | POA: Diagnosis not present

## 2015-07-21 DIAGNOSIS — E291 Testicular hypofunction: Secondary | ICD-10-CM | POA: Diagnosis not present

## 2015-07-21 DIAGNOSIS — N393 Stress incontinence (female) (male): Secondary | ICD-10-CM | POA: Diagnosis not present

## 2015-07-21 DIAGNOSIS — Q6102 Congenital multiple renal cysts: Secondary | ICD-10-CM | POA: Diagnosis not present

## 2015-07-21 DIAGNOSIS — N50819 Testicular pain, unspecified: Secondary | ICD-10-CM | POA: Diagnosis not present

## 2015-07-21 DIAGNOSIS — N200 Calculus of kidney: Secondary | ICD-10-CM | POA: Diagnosis not present

## 2015-07-21 DIAGNOSIS — N359 Urethral stricture, unspecified: Secondary | ICD-10-CM | POA: Diagnosis not present

## 2015-07-21 DIAGNOSIS — Z8546 Personal history of malignant neoplasm of prostate: Secondary | ICD-10-CM | POA: Diagnosis not present

## 2015-07-24 DIAGNOSIS — D692 Other nonthrombocytopenic purpura: Secondary | ICD-10-CM | POA: Diagnosis not present

## 2015-07-24 DIAGNOSIS — L718 Other rosacea: Secondary | ICD-10-CM | POA: Diagnosis not present

## 2015-07-24 DIAGNOSIS — L821 Other seborrheic keratosis: Secondary | ICD-10-CM | POA: Diagnosis not present

## 2015-07-24 DIAGNOSIS — Z85828 Personal history of other malignant neoplasm of skin: Secondary | ICD-10-CM | POA: Diagnosis not present

## 2015-07-24 DIAGNOSIS — L570 Actinic keratosis: Secondary | ICD-10-CM | POA: Diagnosis not present

## 2015-07-28 DIAGNOSIS — B351 Tinea unguium: Secondary | ICD-10-CM | POA: Diagnosis not present

## 2015-07-28 DIAGNOSIS — E1142 Type 2 diabetes mellitus with diabetic polyneuropathy: Secondary | ICD-10-CM | POA: Diagnosis not present

## 2015-07-31 ENCOUNTER — Encounter (HOSPITAL_COMMUNITY): Payer: Medicare Other | Attending: Hematology and Oncology

## 2015-07-31 ENCOUNTER — Encounter (HOSPITAL_COMMUNITY): Payer: Self-pay

## 2015-07-31 VITALS — BP 132/73 | HR 77 | Temp 98.0°F | Resp 18

## 2015-07-31 DIAGNOSIS — C8310 Mantle cell lymphoma, unspecified site: Secondary | ICD-10-CM | POA: Insufficient documentation

## 2015-07-31 DIAGNOSIS — E538 Deficiency of other specified B group vitamins: Secondary | ICD-10-CM

## 2015-07-31 MED ORDER — CYANOCOBALAMIN 1000 MCG/ML IJ SOLN
1000.0000 ug | Freq: Once | INTRAMUSCULAR | Status: AC
  Administered 2015-07-31: 1000 ug via INTRAMUSCULAR

## 2015-07-31 MED ORDER — CYANOCOBALAMIN 1000 MCG/ML IJ SOLN
INTRAMUSCULAR | Status: AC
  Filled 2015-07-31: qty 1

## 2015-07-31 NOTE — Progress Notes (Signed)
1320:  [name] presents today for injection per the provider's orders.  B12 administration without incident; see MAR for injection details.  Patient tolerated procedure well and without incident.  No questions or complaints noted at this time.

## 2015-07-31 NOTE — Patient Instructions (Signed)
[place] at [facility] Discharge Instructions  RECOMMENDATIONS MADE BY THE CONSULTANT AND ANY TEST RESULTS WILL BE SENT TO YOUR REFERRING PHYSICIAN.  B12 injection today. Return as scheduled for port flush. Return as scheduled for office visit and Rituxan infusion.  Thank you for choosing [place] at [facility] to provide your oncology and hematology care.  To afford each patient quality time with our provider, please arrive at least 15 minutes before your scheduled appointment time.    You need to re-schedule your appointment should you arrive 10 or more minutes late.  We strive to give you quality time with our providers, and arriving late affects you and other patients whose appointments are after yours.  Also, if you no show three or more times for appointments you may be dismissed from the clinic at the providers discretion.     Again, thank you for choosing [facility].  Our hope is that these requests will decrease the amount of time that you wait before being seen by our physicians.       _____________________________________________________________  Should you have questions after your visit to [facility], please contact our office at [phone] between the hours of 8:30 a.m. and 4:30 p.m.  Voicemails left after 4:30 p.m. will not be returned until the following business day.  For prescription refill requests, have your pharmacy contact our office.

## 2015-08-04 ENCOUNTER — Encounter (HOSPITAL_COMMUNITY)
Admission: RE | Disposition: A | Discharge status: Home / Self Care | Payer: Self-pay | Source: Ambulatory Visit | Attending: Internal Medicine

## 2015-08-04 ENCOUNTER — Encounter (HOSPITAL_COMMUNITY): Payer: Self-pay | Admitting: *Deleted

## 2015-08-04 ENCOUNTER — Ambulatory Visit (HOSPITAL_COMMUNITY)
Admission: RE | Admit: 2015-08-04 | Discharge: 2015-08-04 | Disposition: A | Discharge status: Home / Self Care | Payer: Medicare Other | Source: Ambulatory Visit | Attending: Internal Medicine | Admitting: Internal Medicine

## 2015-08-04 DIAGNOSIS — K5730 Diverticulosis of large intestine without perforation or abscess without bleeding: Secondary | ICD-10-CM | POA: Insufficient documentation

## 2015-08-04 DIAGNOSIS — Z9221 Personal history of antineoplastic chemotherapy: Secondary | ICD-10-CM | POA: Insufficient documentation

## 2015-08-04 DIAGNOSIS — I2510 Atherosclerotic heart disease of native coronary artery without angina pectoris: Secondary | ICD-10-CM | POA: Insufficient documentation

## 2015-08-04 DIAGNOSIS — K5190 Ulcerative colitis, unspecified, without complications: Secondary | ICD-10-CM | POA: Insufficient documentation

## 2015-08-04 DIAGNOSIS — K219 Gastro-esophageal reflux disease without esophagitis: Secondary | ICD-10-CM

## 2015-08-04 DIAGNOSIS — Z8049 Family history of malignant neoplasm of other genital organs: Secondary | ICD-10-CM | POA: Diagnosis not present

## 2015-08-04 DIAGNOSIS — I10 Essential (primary) hypertension: Secondary | ICD-10-CM | POA: Diagnosis not present

## 2015-08-04 DIAGNOSIS — E119 Type 2 diabetes mellitus without complications: Secondary | ICD-10-CM | POA: Diagnosis not present

## 2015-08-04 DIAGNOSIS — Z8546 Personal history of malignant neoplasm of prostate: Secondary | ICD-10-CM | POA: Insufficient documentation

## 2015-08-04 DIAGNOSIS — C8310 Mantle cell lymphoma, unspecified site: Secondary | ICD-10-CM | POA: Diagnosis not present

## 2015-08-04 DIAGNOSIS — K6389 Other specified diseases of intestine: Secondary | ICD-10-CM

## 2015-08-04 DIAGNOSIS — Z1211 Encounter for screening for malignant neoplasm of colon: Secondary | ICD-10-CM | POA: Insufficient documentation

## 2015-08-04 DIAGNOSIS — Z85038 Personal history of other malignant neoplasm of large intestine: Secondary | ICD-10-CM | POA: Insufficient documentation

## 2015-08-04 DIAGNOSIS — E538 Deficiency of other specified B group vitamins: Secondary | ICD-10-CM | POA: Insufficient documentation

## 2015-08-04 DIAGNOSIS — Z79899 Other long term (current) drug therapy: Secondary | ICD-10-CM | POA: Diagnosis not present

## 2015-08-04 DIAGNOSIS — K648 Other hemorrhoids: Secondary | ICD-10-CM | POA: Diagnosis not present

## 2015-08-04 DIAGNOSIS — Z794 Long term (current) use of insulin: Secondary | ICD-10-CM | POA: Insufficient documentation

## 2015-08-04 DIAGNOSIS — E782 Mixed hyperlipidemia: Secondary | ICD-10-CM | POA: Diagnosis not present

## 2015-08-04 DIAGNOSIS — G4733 Obstructive sleep apnea (adult) (pediatric): Secondary | ICD-10-CM | POA: Diagnosis not present

## 2015-08-04 DIAGNOSIS — D123 Benign neoplasm of transverse colon: Secondary | ICD-10-CM | POA: Diagnosis not present

## 2015-08-04 DIAGNOSIS — Z7984 Long term (current) use of oral hypoglycemic drugs: Secondary | ICD-10-CM | POA: Insufficient documentation

## 2015-08-04 DIAGNOSIS — D125 Benign neoplasm of sigmoid colon: Secondary | ICD-10-CM | POA: Diagnosis not present

## 2015-08-04 DIAGNOSIS — Z8719 Personal history of other diseases of the digestive system: Secondary | ICD-10-CM | POA: Diagnosis not present

## 2015-08-04 HISTORY — PX: COLONOSCOPY: SHX5424

## 2015-08-04 LAB — GLUCOSE, CAPILLARY: Glucose-Capillary: 128 mg/dL — ABNORMAL HIGH (ref 65–99)

## 2015-08-04 SURGERY — COLONOSCOPY
Anesthesia: Moderate Sedation

## 2015-08-04 MED ORDER — MEPERIDINE HCL 50 MG/ML IJ SOLN
INTRAMUSCULAR | Status: DC | PRN
  Administered 2015-08-04 (×2): 25 mg via INTRAVENOUS

## 2015-08-04 MED ORDER — SODIUM CHLORIDE 0.9 % IV SOLN
INTRAVENOUS | Status: DC
  Administered 2015-08-04: 13:00:00 via INTRAVENOUS

## 2015-08-04 MED ORDER — MIDAZOLAM HCL 5 MG/5ML IJ SOLN
INTRAMUSCULAR | Status: AC
  Filled 2015-08-04: qty 10

## 2015-08-04 MED ORDER — MIDAZOLAM HCL 5 MG/5ML IJ SOLN
INTRAMUSCULAR | Status: DC | PRN
  Administered 2015-08-04 (×3): 2 mg via INTRAVENOUS

## 2015-08-04 MED ORDER — PANTOPRAZOLE SODIUM 40 MG PO TBEC: 40.0000 mg | DELAYED_RELEASE_TABLET | Freq: Two times a day (BID) | ORAL | Status: DC

## 2015-08-04 MED ORDER — LIDOCAINE HCL 2 % EX GEL
CUTANEOUS | Status: AC
  Filled 2015-08-04: qty 30

## 2015-08-04 MED ORDER — LIDOCAINE HCL 2 % EX GEL
CUTANEOUS | Status: DC | PRN
  Administered 2015-08-04: 1 via TOPICAL

## 2015-08-04 MED ORDER — MEPERIDINE HCL 50 MG/ML IJ SOLN
INTRAMUSCULAR | Status: AC
  Filled 2015-08-04: qty 1

## 2015-08-04 MED ORDER — STERILE WATER FOR IRRIGATION IR SOLN
Status: DC | PRN
  Administered 2015-08-04: 14:00:00

## 2015-08-04 NOTE — Op Note (Signed)
COLONOSCOPY PROCEDURE REPORT  PATIENT:  [name]  MR#:  [id number] Birthdate:  1934/10/23, 80 y.o., male Endoscopist:  Dr. Rogene Houston, MD Referred By:  Dr. Molli Hazard, MD Procedure Date: 08/04/2015  Procedure:   Colonoscopy  Indications:  Patient is 80 year old Caucasian male with history of mantle cell lymphoma of the colon which was diagnosed in December 2014. He was treated with chemotherapy and now on Rituxan every 2 months. Last colonoscopy was in June 2015 and was noted to be in remission. He also has history of ulcerative colitis and has been in remission for a while.  Informed Consent:  The procedure and risks were reviewed with the patient and informed consent was obtained.  Medications:  Demerol 50 mg IV Versed 6 mg IV Lidocaine jelly was applied to perianal area and anal canal.  Description of procedure:  After a digital rectal exam was performed, that colonoscope was advanced from the anus through the rectum and colon to the area of the cecum, ileocecal valve and appendiceal orifice. The cecum was deeply intubated. These structures were well-seen and photographed for the record. From the level of the cecum and ileocecal valve, the scope was slowly and cautiously withdrawn. The mucosal surfaces were carefully surveyed utilizing scope tip to flexion to facilitate fold flattening as needed. The scope was pulled down into the rectum where a thorough exam including retroflexion was performed. Terminal ileum was also examined.  Findings:   Prep excellent. Normal mucosa of terminal ileum.  colonic mucosa was carefully examined and no changes noted to suggest recurrence of lymphoma. Three small polyps were removed via cold biopsy and submitted together. These were located at hepatic flexure splenic flexure and sigmoid colon.  data diverticula noted at sigmoid colon.  Normal rectal mucosa.  Small hemorrhoids above the dentate line and single anal  papilla.  Therapeutic/Diagnostic Maneuvers Performed:  See above  Complications:   none  EBL: Minimal  Cecal Withdrawal Time:   24  minutes  Impression:   Normal mucosa of terminal ileum.  No endoscopic evidence of recurrent colonic lymphoma. Multiple biopsies   taken from proximal in distal half of the colon and submitted separately.  Three small polyps removed with cold biopsy forceps and submitted together.  Mild sigmoid colon diverticulosis.  Small internal hemorrhoids and anal papilla  Recommendations:  Standard instructions given.  Patient was given rest of the lidocaine jelly tube he could use at home for anal  perianal irritation. I will contact patient with biopsy results and further recommendations.  REHMAN,NAJEEB U  08/04/2015 3:06 PM  CC: Dr. Wende Neighbors, MD & Dr. Rayne Du ref. provider found CC:  Dr. Molli Hazard, MD

## 2015-08-04 NOTE — Discharge Instructions (Signed)
Resume usual medications and diet. Apply lidocaine jelly to perianal area 3-4 times a day as needed until discomfort resolved. No driving for 24 hours. Physician will call with biopsy results.  Colonoscopy, Care After These instructions give you information on caring for yourself after your procedure. Your doctor may also give you more specific instructions. Call your doctor if you have any problems or questions after your procedure. HOME CARE  Do not drive for 24 hours.  Do not sign important papers or use machinery for 24 hours.  You may shower.  You may go back to your usual activities, but go slower for the first 24 hours.  Take rest breaks often during the first 24 hours.  Walk around or use warm packs on your belly (abdomen) if you have belly cramping or gas.  Drink enough fluids to keep your pee (urine) clear or pale yellow.  Resume your normal diet. Avoid heavy or fried foods.  Avoid drinking alcohol for 24 hours or as told by your doctor.  Only take medicines as told by your doctor. If a tissue sample (biopsy) was taken during the procedure:   Do not take aspirin or blood thinners for 7 days, or as told by your doctor.  Do not drink alcohol for 7 days, or as told by your doctor.  Eat soft foods for the first 24 hours. GET HELP IF: You still have a small amount of blood in your poop (stool) 2-3 days after the procedure. GET HELP RIGHT AWAY IF:  You have more than a small amount of blood in your poop.  You see clumps of tissue (blood clots) in your poop.  Your belly is puffy (swollen).  You feel sick to your stomach (nauseous) or throw up (vomit).  You have a fever.  You have belly pain that gets worse and medicine does not help. MAKE SURE YOU:  Understand these instructions.  Will watch your condition.  Will get help right away if you are not doing well or get worse.   This information is not intended to replace advice given to you by your health care  provider. Make sure you discuss any questions you have with your health care provider.   Document Released: 10/12/2010 Document Revised: 09/14/2013 Document Reviewed: 05/17/2013 Elsevier Interactive Patient Education [employer].

## 2015-08-04 NOTE — H&P (Signed)
[name] is an 80 y.o. male.   Chief Complaint: Patient is here for colonoscopy. HPI: Patient is 80 year old Caucasian male who has history of ulcerative colitis and has remained in remission. He underwent colonoscopy in December 2014 and was diagnosed with mantle cell lymphoma. He was treated with chemotherapy and has remained in remission. Last colonoscopy was in June 2015 confirming remission. He is undergoing colonoscopy with biopsies to determine disease stage. He denies rectal bleeding. He has intermittent left low quadrant abdominal pain. Family history is significant for CRC and brother was 15 at the time of diagnosis and died at 65. Family history results were significant for brain tumor another brother who was in his 62s and one sister had ovarian or uterine cancer.  Past Medical History  Diagnosis Date  . Type 2 diabetes mellitus ([name])   . 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 Mixed hyperlipidemia   . PVD (peripheral vascular disease) ([name])   . Essential hypertension, benign   . Prostate cancer ([name])   . Colon cancer ([place])   . B12 deficiency 02/07/2014  . Obstructive sleep apnea     does not use  . GERD (gastroesophageal reflux disease)     Past Surgical History  Procedure Laterality Date  . Tonsillectomy    . Back surgery    . Vasectomy    . Knee surgery Right     total knee  . Shoulder surgery    . Prostatectomy    . Colonoscopy with esophagogastroduodenoscopy (egd) N/A 08/27/2013    Procedure: COLONOSCOPY WITH ESOPHAGOGASTRODUODENOSCOPY (EGD);  Surgeon: Rogene Houston, MD;  [facility];  Service: Endoscopy;  Laterality: N/A;  730  . Balloon dilation N/A 08/27/2013    Procedure: BALLOON DILATION;  Surgeon: Rogene Houston, MD;  [facility]: [facility];  Service: Endoscopy;  Laterality: N/A;  Venia Minks dilation N/A 08/27/2013    Procedure: Venia Minks DILATION;   Surgeon: Rogene Houston, MD;  [facility]: [facility];  Service: Endoscopy;  Laterality: N/A;  . Savory dilation N/A 08/27/2013    Procedure: SAVORY DILATION;  Surgeon: Rogene Houston, MD;  [facility]: [facility];  Service: Endoscopy;  Laterality: N/A;  . Portacath placement Right 2014  . Colonoscopy N/A 12/17/2013    Procedure: COLONOSCOPY;  Surgeon: Rogene Houston, MD;  [facility]: [facility];  Service: Endoscopy;  Laterality: N/A;  940  . Colon surgery    . Knee arthroscopy Left   . Removal of port Right   . Colonoscopy N/A 02/28/2014    Procedure: COLONOSCOPY;  Surgeon: Rogene Houston, MD;  [facility]: [facility];  Service: Endoscopy;  Laterality: N/A;  730    Family History  Problem Relation Age of Onset  . Colon cancer Brother   . Cancer Brother   . Diabetes Father   . Cancer Brother    Social History:  reports that he has never smoked. He has never used smokeless tobacco. He reports that he does not drink alcohol or use illicit drugs.  Allergies:  Allergies  Allergen Reactions  . Bee Venom Anaphylaxis  . Adhesive [Tape] Hives  . Ciprofloxacin     Unknown   . Codeine     Unknown    . Iodine     Unknown    . Latex     Unknown    . Povidone-Iodine  Unknown    . Simvastatin     Unknown      Medications Prior to Admission  Medication Sig Dispense Refill  . acetaminophen (TYLENOL) 325 MG tablet Take 650 mg by mouth every 4 (four) hours as needed for mild pain or moderate pain. Take 2 tablets 1 hour prior to Rituxan treatment.    . Cyanocobalamin (VITAMIN B-12 IJ) Inject as directed every 30 (thirty) days.     . insulin glargine (LANTUS SOLOSTAR) 100 UNIT/ML injection Inject 50 Units into the skin every morning.     . insulin lispro (HUMALOG) 100 UNIT/ML injection Inject 10 Units into the skin daily as needed for high blood sugar.    . lidocaine-prilocaine (EMLA) cream Apply 1 application topically daily as needed (chemo). Apply a quarter size amount to port  site 1 hour prior to chemo. Do not rub in. Cover with plastic wrap.    . lisinopril (PRINIVIL,ZESTRIL) 40 MG tablet Take 40 mg by mouth daily.    . metFORMIN (GLUCOPHAGE) 1000 MG tablet Take 1,000 mg by mouth 2 (two) times daily with a meal.     . pantoprazole (PROTONIX) 40 MG tablet Take 1 tablet (40 mg total) by mouth daily. 30 tablet 11  . sulfacetamide (BLEPH-10) 10 % ophthalmic solution Place 1 drop into both eyes daily as needed (irritation).     . cephALEXin (KEFLEX) 500 MG capsule Take 1 capsule (500 mg total) by mouth 3 (three) times daily. (Patient not taking: Reported on 08/03/2015) 15 capsule 0  . diazepam (VALIUM) 5 MG tablet Take one tablet at bedtime if needed for sleep. (Patient not taking: Reported on 08/03/2015) 90 tablet 0  . escitalopram (LEXAPRO) 20 MG tablet Take one tablet daily at bedtime (Patient not taking: Reported on 06/30/2015) 60 tablet 6  . hydrocortisone-pramoxine (PROCTOFOAM HC) rectal foam Place 1 applicator rectally 2 (two) times daily. (Patient not taking: Reported on 08/03/2015) 10 g 0  . methocarbamol (ROBAXIN) 500 MG tablet Take 1 tablet (500 mg total) by mouth every 6 (six) hours as needed for muscle spasms. (Patient not taking: Reported on 08/03/2015) 30 tablet 0    Results for orders placed or performed during the hospital encounter of 08/04/15 (from the past 48 hour(s))  Glucose, capillary     Status: Abnormal   Collection Time: 08/04/15  1:24 PM  Result Value Ref Range   Glucose-Capillary 128 (H) 65 - 99 mg/dL   No results found.  ROS  Blood pressure 128/73, pulse 71, temperature 98 F (36.7 C), temperature source Oral, resp. rate 15, height 5' 11"  (1.803 m), weight 230 lb (104.327 kg). Physical Exam  Constitutional: He appears well-developed and well-nourished.  HENT:  Mouth/Throat: Oropharynx is clear and moist.  Eyes: Conjunctivae are normal. No scleral icterus.  Neck: No thyromegaly present.  Cardiovascular: Normal rate, regular rhythm and  normal heart sounds.   No murmur heard. Respiratory: Effort normal and breath sounds normal.  GI: Soft. There is tenderness (mild tenderness at LLQ.).  Musculoskeletal: He exhibits no edema.  Lymphadenopathy:    He has no cervical adenopathy.  Neurological: He is alert.  Skin: Skin is warm and dry.     Assessment/Plan History of mantle cell lymphoma of colon. History of ulcerative colitis. Surveillance colonoscopy.  REHMAN,NAJEEB U 08/04/2015, 2:09 PM

## 2015-08-09 ENCOUNTER — Encounter (HOSPITAL_COMMUNITY): Payer: Self-pay | Admitting: Internal Medicine

## 2015-08-24 DIAGNOSIS — E119 Type 2 diabetes mellitus without complications: Secondary | ICD-10-CM | POA: Diagnosis not present

## 2015-08-24 DIAGNOSIS — Z125 Encounter for screening for malignant neoplasm of prostate: Secondary | ICD-10-CM | POA: Diagnosis not present

## 2015-08-30 ENCOUNTER — Encounter (HOSPITAL_COMMUNITY): Payer: Medicare Other

## 2015-08-30 ENCOUNTER — Encounter (HOSPITAL_COMMUNITY): Payer: Medicare Other | Attending: Hematology and Oncology

## 2015-08-30 VITALS — BP 146/85 | HR 69 | Temp 98.1°F | Resp 18

## 2015-08-30 DIAGNOSIS — Z95828 Presence of other vascular implants and grafts: Secondary | ICD-10-CM

## 2015-08-30 DIAGNOSIS — C8310 Mantle cell lymphoma, unspecified site: Secondary | ICD-10-CM | POA: Diagnosis not present

## 2015-08-30 MED ORDER — CYANOCOBALAMIN 1000 MCG/ML IJ SOLN
1000.0000 ug | Freq: Once | INTRAMUSCULAR | Status: AC
  Administered 2015-08-30: 1000 ug via INTRAMUSCULAR
  Filled 2015-08-30: qty 1

## 2015-08-30 MED ORDER — HEPARIN SOD (PORK) LOCK FLUSH 100 UNIT/ML IV SOLN
500.0000 [IU] | Freq: Once | INTRAVENOUS | Status: AC
  Administered 2015-08-30: 500 [IU] via INTRAVENOUS
  Filled 2015-08-30: qty 5

## 2015-08-30 MED ORDER — CYANOCOBALAMIN 1000 MCG/ML IJ SOLN
INTRAMUSCULAR | Status: AC
  Filled 2015-08-30: qty 1

## 2015-08-30 MED ORDER — SODIUM CHLORIDE 0.9 % IJ SOLN
10.0000 mL | Freq: Once | INTRAMUSCULAR | Status: AC
  Administered 2015-08-30: 10 mL via INTRAVENOUS

## 2015-08-30 NOTE — Patient Instructions (Signed)
[place] at [facility] Discharge Instructions  RECOMMENDATIONS MADE BY THE CONSULTANT AND ANY TEST RESULTS WILL BE SENT TO YOUR REFERRING PHYSICIAN.  Vitamin B12 1000 mcg injection given as ordered. Port flush done today. Return as scheduled.  Thank you for choosing [place] at [facility] to provide your oncology and hematology care.  To afford each patient quality time with our provider, please arrive at least 15 minutes before your scheduled appointment time.    You need to re-schedule your appointment should you arrive 10 or more minutes late.  We strive to give you quality time with our providers, and arriving late affects you and other patients whose appointments are after yours.  Also, if you no show three or more times for appointments you may be dismissed from the clinic at the providers discretion.     Again, thank you for choosing [facility].  Our hope is that these requests will decrease the amount of time that you wait before being seen by our physicians.       _____________________________________________________________  Should you have questions after your visit to [facility], please contact our office at [phone] between the hours of 8:30 a.m. and 4:30 p.m.  Voicemails left after 4:30 p.m. will not be returned until the following business day.  For prescription refill requests, have your pharmacy contact our office.

## 2015-08-30 NOTE — Progress Notes (Signed)
[name] presented for Portacath access and flush. Proper placement of portacath confirmed by CXR. Portacath located right chest wall accessed with  H 20 needle. No blood return present. Portacath flushed with 65m NS and 500U/59mHeparin and needle removed intact. Procedure without incident. Patient tolerated procedure well.  NaEarnest Conroyresents today for injection per MD orders. B12 1000 mcg administered IM in left Abdomen. Administration without incident. Patient tolerated well.

## 2015-09-01 DIAGNOSIS — N451 Epididymitis: Secondary | ICD-10-CM | POA: Diagnosis not present

## 2015-09-07 DIAGNOSIS — J00 Acute nasopharyngitis [common cold]: Secondary | ICD-10-CM | POA: Diagnosis not present

## 2015-09-11 DIAGNOSIS — H60541 Acute eczematoid otitis externa, right ear: Secondary | ICD-10-CM | POA: Diagnosis not present

## 2015-09-11 DIAGNOSIS — H903 Sensorineural hearing loss, bilateral: Secondary | ICD-10-CM | POA: Diagnosis not present

## 2015-09-21 DIAGNOSIS — J219 Acute bronchiolitis, unspecified: Secondary | ICD-10-CM | POA: Diagnosis not present

## 2015-09-27 NOTE — Progress Notes (Signed)
Brandon Neighbors, MD [place] [address]  Mantle cell lymphoma of solid organ excluding spleen [facility]) - Plan: CBC with Differential, Comprehensive metabolic panel, Lactate dehydrogenase, CBC with Differential, Comprehensive metabolic panel, Lactate dehydrogenase  CURRENT THERAPY:Rituxan maintenance every 60 days, today is cycle #7/10.  INTERVAL HISTORY: [name] 81 y.o. male returns for followup of Mantle Cell lymphoma of the large intestine diagnosed 08/2013, S/P BR x 6 cycles from 09/14/2013- 02/07/2014.  Now on maintenance Rituxan every 90 days beginning on 05/10/2014.  Colonoscopy on 02/28/2014 by Dr. Laural Golden demonstrated NED.    Mantle cell lymphoma ([place])   08/27/2013 Initial Diagnosis Colon, biopsy, random - MANTLE CELL LYMPHOMA   09/06/2013 Imaging CT CAP- No significant lymphadenopathy identified within the chest, abdomen or pelvis.   09/14/2013 - 02/07/2014 Chemotherapy Bendamustine/Rituxan x 6 cycles with Neulasta support   12/17/2013 Procedure Colonoscopy with biopsy by Dr. Laural Golden.   12/17/2013 Pathology Results Colon, biopsy, ascending and transverse - BENIGN APPEARING COLONIC MUCOSA WITH SCATTERED ATYPICAL LYMPHOCYTES.Overall, these findings are suspicious for minimal residual mantle cell lymphoma.   02/28/2014 Procedure Colonoscopy with biopsy by Dr. Laural Golden   02/28/2014 Pathology Results Colon, biopsy, proximal and distal - BENIGN COLONIC MUCOSA. - NO SIGNIFICANT INFLAMMATION OR OTHER ABNORMALITIES IDENTIFIED. - NO EVIDENCE OF LYMPHOMA, ADENOMATOUS CHANGES OR MALIGNANCY.   02/28/2014 Remission No evidence of malignancy on colonosocpy and pathology from biopsy.   05/10/2014 -  Chemotherapy Maintenance Rituxan 500 mg/m cycle 1   06/30/2015 - 07/02/2015 Hospital Admission AKI   08/04/2015 Procedure Colonoscopy with biopsy by Dr. Laural Golden.   08/04/2015 Pathology Results Colon, biopsy, right and left. - BENIGN COLORECTAL MUCOSA. - ASSOCIATED BENIGN LYMPHOID  AGGREGATES. - NO EVIDENCE OF SIGNIFICANT INFLAMMATION, DYSPLASIA OR MALIGNANCY.    I personally reviewed and went over laboratory results with the patient.  The results are noted within this dictation.  Labs will be updated today prior to Rituxan infusion.  His labs meet treatment parameters.  I personally reviewed and went over pathology results with the patient.  Colonoscopy by Dr. Laural Golden on 08/04/2015 was NED.  Chart reviewed and oncology history updated.  Hospitalization in October for AKI is noted and added to oncology history.  This is unrelated to his maintenance treatment for [facility] Cell Lymphoma.  He underwent colonoscopy by Dr. Laural Golden about 6 weeks ago in November 2016.  There was no evidence of disease with negative biopsies.  [name] notes that he has been seen by Dr. Merlene Laughter and diagnosed with Parkinson's Disease.  He reports that he started a medication for this that was prescribed by Dr. Merlene Laughter and it was not effective at managing his tremor.  "I'm not going back," he said.  We discussed his options in detail, and he is interested in a second opinion elsewhere.  I admit that I am not too familiar with neurologists in [place] or [facility]  He has an upcoming appointment with Dr. Nevada Crane, primary care provider, tomorrow.  I have asked him to request a recommendation from Dr. Nevada Crane as I am sure he is more familiar with Neurology.  I would be glad to assist if necessary.  He reports his tremor comes and goes.  Some days are worse and other days it is nearly nonexistent.  He notes a recent ear infection and sore throat that was treated by his primary care provider.  That has since resolved.  He otherwise denies any complaints.  Past Medical History  Diagnosis  Date  . Type 2 diabetes mellitus ([place])   . 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 Mixed hyperlipidemia   . PVD (peripheral vascular disease)  ([place])   . Essential hypertension, benign   . Prostate cancer ([place])   . Colon cancer ([place])   . B12 deficiency 02/07/2014  . Obstructive sleep apnea     does not use  . GERD (gastroesophageal reflux disease)     has Mixed hyperlipidemia; HYPERTENSION, BENIGN; Coronary atherosclerosis of native coronary artery; VASOMOTOR RHINITIS; GERD; OSA (obstructive sleep apnea); H/O ulcerative colitis; Mantle cell lymphoma ([place]); Prostate cancer ([place]); Proctitis, radiation; Abdominal pain, left lower quadrant; Abdominal pain, unspecified site; Lower GI bleed; Diverticulitis; Leukopenia; Diverticulosis of colon with hemorrhage; Thrombocytopenia, unspecified ([place]); Acute blood loss anemia; B12 deficiency; AKI (acute kidney injury) ([place]); Acute kidney injury ([place]); and CKD (chronic kidney disease) stage 3, GFR 30-59 ml/min on his problem list.     is allergic to bee venom; adhesive; ciprofloxacin; codeine; iodine; latex; povidone-iodine; and simvastatin.  Current Outpatient Prescriptions on File Prior to Visit  Medication Sig Dispense Refill  . acetaminophen (TYLENOL) 325 MG tablet Take 650 mg by mouth every 4 (four) hours as needed for mild pain or moderate pain. Take 2 tablets 1 hour prior to Rituxan treatment.    . Cyanocobalamin (VITAMIN B-12 IJ) Inject as directed every 30 (thirty) days.     . insulin glargine (LANTUS SOLOSTAR) 100 UNIT/ML injection Inject 50 Units into the skin every morning.     . insulin lispro (HUMALOG) 100 UNIT/ML injection Inject 10 Units into the skin daily as needed for high blood sugar.    . lidocaine-prilocaine (EMLA) cream Apply 1 application topically daily as needed (chemo). Apply a quarter size amount to port site 1 hour prior to chemo. Do not rub in. Cover with plastic wrap.    . lisinopril (PRINIVIL,ZESTRIL) 40 MG tablet Take 40 mg by mouth daily.    . metFORMIN (GLUCOPHAGE) 1000 MG tablet Take 1,000 mg by mouth 2 (two) times daily with a meal.     . pantoprazole (PROTONIX)  40 MG tablet Take 1 tablet (40 mg total) by mouth 2 (two) times daily before a meal. 60 tablet 5  . sulfacetamide (BLEPH-10) 10 % ophthalmic solution Place 1 drop into both eyes daily as needed (irritation).     . diazepam (VALIUM) 5 MG tablet Take one tablet at bedtime if needed for sleep. (Patient not taking: Reported on 08/03/2015) 90 tablet 0  . [DISCONTINUED] insulin aspart (NOVOLOG) 100 UNIT/ML injection Inject 20 Units into the skin 3 (three) times daily before meals.      . [DISCONTINUED] rosuvastatin (CRESTOR) 40 MG tablet Take 40 mg by mouth daily.       Current Facility-Administered Medications on File Prior to Visit  Medication Dose Route Frequency Provider Last Rate Last Dose  . acetaminophen (TYLENOL) tablet 650 mg  650 mg Oral Once Patrici Ranks, MD   650 mg at 09/28/15 0939  . cyanocobalamin ((VITAMIN B-12)) injection 1,000 mcg  1,000 mcg Intramuscular Once [employer], PA-C      . diphenhydrAMINE (BENADRYL) capsule 50 mg  50 mg Oral Once Patrici Ranks, MD   50 mg at 09/28/15 0939  . heparin lock flush 100 unit/mL  500 Units Intracatheter Once PRN Patrici Ranks, MD      . sodium chloride 0.9 %  injection 10 mL  10 mL Intracatheter PRN Patrici Ranks, MD        Past Surgical History  Procedure Laterality Date  . Tonsillectomy    . Back surgery    . Vasectomy    . Knee surgery Right     total knee  . Shoulder surgery    . Prostatectomy    . Colonoscopy with esophagogastroduodenoscopy (egd) N/A 08/27/2013    Procedure: COLONOSCOPY WITH ESOPHAGOGASTRODUODENOSCOPY (EGD);  Surgeon: Rogene Houston, MD;  [facility];  Service: Endoscopy;  Laterality: N/A;  730  . Balloon dilation N/A 08/27/2013    Procedure: BALLOON DILATION;  Surgeon: Rogene Houston, MD;  [facility];  Service: Endoscopy;  Laterality: N/A;  Venia Minks dilation N/A 08/27/2013    Procedure: Venia Minks DILATION;  Surgeon: Rogene Houston, MD;  [facility];  Service:  Endoscopy;  Laterality: N/A;  . Savory dilation N/A 08/27/2013    Procedure: SAVORY DILATION;  Surgeon: Rogene Houston, MD;  [facility];  Service: Endoscopy;  Laterality: N/A;  . Portacath placement Right 2014  . Colonoscopy N/A 12/17/2013    Procedure: COLONOSCOPY;  Surgeon: Rogene Houston, MD;  [facility];  Service: Endoscopy;  Laterality: N/A;  940  . Colon surgery    . Knee arthroscopy Left   . Removal of port Right   . Colonoscopy N/A 02/28/2014    Procedure: COLONOSCOPY;  Surgeon: Rogene Houston, MD;  [facility];  Service: Endoscopy;  Laterality: N/A;  730  . Colonoscopy N/A 08/04/2015    Procedure: COLONOSCOPY;  Surgeon: Rogene Houston, MD;  [facility];  Service: Endoscopy;  Laterality: N/A;  200 - moved to 11/11 @ 2:10 - Ann to notify pt    Denies any headaches, dizziness, double vision, fevers, chills, night sweats, nausea, vomiting, diarrhea, constipation, chest pain, heart palpitations, shortness of breath, blood in stool, black tarry stool, urinary pain, urinary burning, urinary frequency, hematuria.   PHYSICAL EXAMINATION  ECOG PERFORMANCE STATUS: 1 - Symptomatic but completely ambulatory  There were no vitals filed for this visit.  GENERAL:alert, no distress, well nourished, well developed, comfortable, cooperative, smiling and unaccompanied and in chemo-bed prepared for Rituxan therapy. SKIN: skin color, texture, turgor are normal, no rashes or significant lesions HEAD: Normocephalic, No masses, lesions, tenderness or abnormalities EYES: normal, EOMI, Conjunctiva are pink and non-injected EARS: External ears normal OROPHARYNX:lips, buccal mucosa, and tongue normal and mucous membranes are moist  NECK: supple, no adenopathy, trachea midline LYMPH:  no palpable lymphadenopathy, no hepatosplenomegaly BREAST:not examined LUNGS: positive findings: expiratory wheezing  bilaterally. HEART: regular rate & rhythm, no murmurs,  no gallops, S1 normal and S2 normal ABDOMEN:abdomen soft, non-tender, obese, normal bowel sounds and no masses or organomegaly BACK: Back symmetric, no curvature. EXTREMITIES:less then 2 second capillary refill, no joint deformities, effusion, or inflammation, no skin discoloration, no clubbing, no cyanosis  NEURO: alert & oriented x 3 with fluent speech, positive findings: rest tremor    LABORATORY DATA: CBC    Component Value Date/Time   WBC 4.2 09/28/2015 0900   RBC 3.53* 09/28/2015 0900   RBC 3.55* 08/02/2014 0825   HGB 11.0* 09/28/2015 0900   HCT 33.7* 09/28/2015 0900   PLT 123* 09/28/2015 0900   MCV 95.5 09/28/2015 0900   MCH 31.2 09/28/2015 0900   MCHC 32.6 09/28/2015 0900   RDW 15.2 09/28/2015 0900   LYMPHSABS 0.8 09/28/2015 0900  MONOABS 0.3 09/28/2015 0900   EOSABS 0.2 09/28/2015 0900   BASOSABS 0.0 09/28/2015 0900      Chemistry      Component Value Date/Time   NA 140 09/28/2015 0900   K 4.6 09/28/2015 0900   CL 106 09/28/2015 0900   CO2 25 09/28/2015 0900   BUN 30* 09/28/2015 0900   CREATININE 1.47* 09/28/2015 0900   CREATININE 1.17 11/28/2014 0955      Component Value Date/Time   CALCIUM 9.0 09/28/2015 0900   ALKPHOS 60 09/28/2015 0900   AST 24 09/28/2015 0900   ALT 7* 09/28/2015 0900   BILITOT 0.5 09/28/2015 0900        PENDING LABS:   RADIOGRAPHIC STUDIES:  No results found.   PATHOLOGY:    ASSESSMENT AND PLAN:  Mantle cell lymphoma ([place]) Mantle Cell lymphoma of the large intestine diagnosed 08/2013, S/P BR x 6 cycles from 09/14/2013- 02/07/2014.  Now on maintenance Rituxan every 90 days beginning on 05/10/2014.  Colonoscopy on 02/28/2014 by Dr. Laural Golden demonstrated NED.  Oncology history is updated.  Antibody plan updated to reflect every 60 day cycles.  He is here today for his 7/10 Rituxan maintenance treatment.  Chart is reviewed in detail and recent hospitalization in October 2016 is noted for AKI with hyperkalemia.  He was in the  hospital for 48-72 hours and discharged.  Pre-treatment labs today: CBC diff, CMET, LDH  Labs ordered for his next treatment as well: CBC diff, CMET, LDH  Recently seen by Neurology, Dr. Merlene Laughter, for tremor.  Diagnosed with Parkinson's Syndrome/Disease.  Patient notes no benefit to prescribed medications and therefore, "I'm not going back."  He has an appointment with Dr. Nevada Crane tomorrow.  I have recommended a second opinion with another neurologist.  I will defer recommendation of neurologist to Dr. Nevada Crane as I am not familiar with newer neurologists locally.  If necessary, Mr. Spellman knows to call us and I can find out which neurologist may be able to serve him best.  Return in 2 months for follow-up.    THERAPY PLAN:  Continue with Rituxan maintenance as prescribed.  All questions were answered. The patient knows to call the clinic with any problems, questions or concerns. We can certainly see the patient much sooner if necessary.  Patient and plan discussed with Dr. Ancil Linsey and she is in agreement with the aforementioned.   This note is electronically signed by: Doy Mince 09/28/2015 10:54 AM

## 2015-09-27 NOTE — Assessment & Plan Note (Addendum)
Mantle Cell lymphoma of the large intestine diagnosed 08/2013, S/P BR x 6 cycles from 09/14/2013- 02/07/2014.  Now on maintenance Rituxan every 90 days beginning on 05/10/2014.  Colonoscopy on 02/28/2014 by Dr. Laural Golden demonstrated NED.  Oncology history is updated.  Antibody plan updated to reflect every 60 day cycles.  He is here today for his 7/10 Rituxan maintenance treatment.  Chart is reviewed in detail and recent hospitalization in October 2016 is noted for AKI with hyperkalemia.  He was in the hospital for 48-72 hours and discharged.  Pre-treatment labs today: CBC diff, CMET, LDH  Labs ordered for his next treatment as well: CBC diff, CMET, LDH  Recently seen by Neurology, Dr. Merlene Laughter, for tremor.  Diagnosed with Parkinson's Syndrome/Disease.  Patient notes no benefit to prescribed medications and therefore, "I'm not going back."  He has an appointment with Dr. Nevada Crane tomorrow.  I have recommended a second opinion with another neurologist.  I will defer recommendation of neurologist to Dr. Nevada Crane as I am not familiar with newer neurologists locally.  If necessary, Mr. Square knows to call us and I can find out which neurologist may be able to serve him best.  Return in 2 months for follow-up.

## 2015-09-28 ENCOUNTER — Encounter (HOSPITAL_BASED_OUTPATIENT_CLINIC_OR_DEPARTMENT_OTHER): Payer: Medicare Other | Admitting: Oncology

## 2015-09-28 ENCOUNTER — Encounter (HOSPITAL_COMMUNITY): Payer: Medicare Other

## 2015-09-28 ENCOUNTER — Encounter (HOSPITAL_COMMUNITY): Payer: Medicare Other | Attending: Hematology and Oncology

## 2015-09-28 ENCOUNTER — Encounter (HOSPITAL_COMMUNITY): Payer: Self-pay | Admitting: Oncology

## 2015-09-28 VITALS — BP 139/85 | HR 76 | Temp 98.2°F | Resp 18 | Wt 233.5 lb

## 2015-09-28 DIAGNOSIS — G20 Parkinson's disease: Secondary | ICD-10-CM

## 2015-09-28 DIAGNOSIS — C8310 Mantle cell lymphoma, unspecified site: Secondary | ICD-10-CM | POA: Diagnosis not present

## 2015-09-28 DIAGNOSIS — C8319 Mantle cell lymphoma, extranodal and solid organ sites: Secondary | ICD-10-CM

## 2015-09-28 DIAGNOSIS — Z5112 Encounter for antineoplastic immunotherapy: Secondary | ICD-10-CM

## 2015-09-28 LAB — COMPREHENSIVE METABOLIC PANEL
ALT: 7 U/L — ABNORMAL LOW (ref 17–63)
AST: 24 U/L (ref 15–41)
Albumin: 3.9 g/dL (ref 3.5–5.0)
Alkaline Phosphatase: 60 U/L (ref 38–126)
Anion gap: 9 (ref 5–15)
BUN: 30 mg/dL — ABNORMAL HIGH (ref 6–20)
CHLORIDE: 106 mmol/L (ref 101–111)
CO2: 25 mmol/L (ref 22–32)
Calcium: 9 mg/dL (ref 8.9–10.3)
Creatinine, Ser: 1.47 mg/dL — ABNORMAL HIGH (ref 0.61–1.24)
GFR, EST AFRICAN AMERICAN: 51 mL/min — AB (ref >60)
GFR, EST NON AFRICAN AMERICAN: 44 mL/min — AB (ref >60)
Glucose, Bld: 171 mg/dL — ABNORMAL HIGH (ref 65–99)
POTASSIUM: 4.6 mmol/L (ref 3.5–5.1)
SODIUM: 140 mmol/L (ref 135–145)
Total Bilirubin: 0.5 mg/dL (ref 0.3–1.2)
Total Protein: 6.7 g/dL (ref 6.5–8.1)

## 2015-09-28 LAB — CBC WITH DIFFERENTIAL/PLATELET
BASOS ABS: 0 10*3/uL (ref 0.0–0.1)
Basophils Relative: 1 %
EOS ABS: 0.2 10*3/uL (ref 0.0–0.7)
EOS PCT: 4 %
HCT: 33.7 % — ABNORMAL LOW (ref 39.0–52.0)
Hemoglobin: 11 g/dL — ABNORMAL LOW (ref 13.0–17.0)
LYMPHS PCT: 19 %
Lymphs Abs: 0.8 10*3/uL (ref 0.7–4.0)
MCH: 31.2 pg (ref 26.0–34.0)
MCHC: 32.6 g/dL (ref 30.0–36.0)
MCV: 95.5 fL (ref 78.0–100.0)
MONO ABS: 0.3 10*3/uL (ref 0.1–1.0)
Monocytes Relative: 7 %
Neutro Abs: 2.9 10*3/uL (ref 1.7–7.7)
Neutrophils Relative %: 69 %
PLATELETS: 123 10*3/uL — AB (ref 150–400)
RBC: 3.53 MIL/uL — ABNORMAL LOW (ref 4.22–5.81)
RDW: 15.2 % (ref 11.5–15.5)
WBC: 4.2 10*3/uL (ref 4.0–10.5)

## 2015-09-28 LAB — LACTATE DEHYDROGENASE: LDH: 129 U/L (ref 98–192)

## 2015-09-28 MED ORDER — SODIUM CHLORIDE 0.9 % IV SOLN
500.0000 mg/m2 | Freq: Once | INTRAVENOUS | Status: AC
  Administered 2015-09-28: 1100 mg via INTRAVENOUS
  Filled 2015-09-28: qty 110

## 2015-09-28 MED ORDER — CYANOCOBALAMIN 1000 MCG/ML IJ SOLN
1000.0000 ug | Freq: Once | INTRAMUSCULAR | Status: AC
  Administered 2015-09-28: 1000 ug via INTRAMUSCULAR
  Filled 2015-09-28: qty 1

## 2015-09-28 MED ORDER — SODIUM CHLORIDE 0.9 % IV SOLN
Freq: Once | INTRAVENOUS | Status: AC
  Administered 2015-09-28: 09:00:00 via INTRAVENOUS

## 2015-09-28 MED ORDER — ACETAMINOPHEN 325 MG PO TABS
650.0000 mg | ORAL_TABLET | Freq: Once | ORAL | Status: DC
  Filled 2015-09-28: qty 2

## 2015-09-28 MED ORDER — HEPARIN SOD (PORK) LOCK FLUSH 100 UNIT/ML IV SOLN
500.0000 [IU] | Freq: Once | INTRAVENOUS | Status: AC
  Administered 2015-09-28: 500 [IU] via INTRAVENOUS

## 2015-09-28 MED ORDER — HEPARIN SOD (PORK) LOCK FLUSH 100 UNIT/ML IV SOLN
500.0000 [IU] | Freq: Once | INTRAVENOUS | Status: AC | PRN
  Filled 2015-09-28: qty 5

## 2015-09-28 MED ORDER — SODIUM CHLORIDE 0.9 % IJ SOLN
10.0000 mL | INTRAMUSCULAR | Status: DC | PRN
  Administered 2015-09-28: 10 mL
  Filled 2015-09-28: qty 10

## 2015-09-28 MED ORDER — DIPHENHYDRAMINE HCL 25 MG PO CAPS
50.0000 mg | ORAL_CAPSULE | Freq: Once | ORAL | Status: DC
  Filled 2015-09-28: qty 2

## 2015-09-28 NOTE — Progress Notes (Signed)
1300:  Tolerated infusion w/o adverse reaction.  A&Ox4, in no distress. VSS.  Discharged ambulatory.

## 2015-09-28 NOTE — Patient Instructions (Signed)
[place] at [facility]  Discharge Instructions:  Labs are good for treatment today.  Rituxan today. Return in 2 months for lab work, treatment, and follow-up appointment. Labs from today 09/28/2015 are below:  CBC    Component Value Date/Time   WBC 4.2 09/28/2015 0900   RBC 3.53* 09/28/2015 0900   RBC 3.55* 08/02/2014 0825   HGB 11.0* 09/28/2015 0900   HCT 33.7* 09/28/2015 0900   PLT 123* 09/28/2015 0900   MCV 95.5 09/28/2015 0900   MCH 31.2 09/28/2015 0900   MCHC 32.6 09/28/2015 0900   RDW 15.2 09/28/2015 0900   LYMPHSABS 0.8 09/28/2015 0900   MONOABS 0.3 09/28/2015 0900   EOSABS 0.2 09/28/2015 0900   BASOSABS 0.0 09/28/2015 0900      Chemistry      Component Value Date/Time   NA 140 09/28/2015 0900   K 4.6 09/28/2015 0900   CL 106 09/28/2015 0900   CO2 25 09/28/2015 0900   BUN 30* 09/28/2015 0900   CREATININE 1.47* 09/28/2015 0900   CREATININE 1.17 11/28/2014 0955      Component Value Date/Time   CALCIUM 9.0 09/28/2015 0900   ALKPHOS 60 09/28/2015 0900   AST 24 09/28/2015 0900   ALT 7* 09/28/2015 0900   BILITOT 0.5 09/28/2015 0900      Results for [name], [name] (MRN [id number]) as of 09/28/2015 09:48  Ref. Range 09/28/2015 09:00  LDH Latest Ref Range: 98-192 U/L 129   _______________________________________________________________  Thank you for choosing [place] at [facility] to provide your oncology and hematology care.  To afford each patient quality time with our providers, please arrive at least 15 minutes before your scheduled appointment.  You need to re-schedule your appointment if you arrive 10 or more minutes late.  We strive to give you quality time with our providers, and arriving late affects you and other patients whose appointments are after yours.  Also, if you no show three or more times for appointments you may be dismissed from the clinic.  Again, thank you for choosing [place] [facility] hope is that these requests will allow you access to exceptional care and in a timely manner. _______________________________________________________________  If you have questions after your visit, please contact our office at [phone] between the hours of 8:30 a.m. and 5:00 p.m. Voicemails left after 4:30 p.m. will not be returned until the following business day. _______________________________________________________________  For prescription refill requests, have your pharmacy contact our office. _______________________________________________________________  Recommendations made by the consultant and any test results will be sent to your referring physician. _______________________________________________________________

## 2015-09-29 DIAGNOSIS — G252 Other specified forms of tremor: Secondary | ICD-10-CM | POA: Diagnosis not present

## 2015-09-29 DIAGNOSIS — D519 Vitamin B12 deficiency anemia, unspecified: Secondary | ICD-10-CM | POA: Diagnosis not present

## 2015-09-29 DIAGNOSIS — H6500 Acute serous otitis media, unspecified ear: Secondary | ICD-10-CM | POA: Diagnosis not present

## 2015-09-29 DIAGNOSIS — N182 Chronic kidney disease, stage 2 (mild): Secondary | ICD-10-CM | POA: Diagnosis not present

## 2015-09-29 DIAGNOSIS — J00 Acute nasopharyngitis [common cold]: Secondary | ICD-10-CM | POA: Diagnosis not present

## 2015-09-29 DIAGNOSIS — D509 Iron deficiency anemia, unspecified: Secondary | ICD-10-CM | POA: Diagnosis not present

## 2015-09-29 DIAGNOSIS — E1122 Type 2 diabetes mellitus with diabetic chronic kidney disease: Secondary | ICD-10-CM | POA: Diagnosis not present

## 2015-09-29 DIAGNOSIS — C8310 Mantle cell lymphoma, unspecified site: Secondary | ICD-10-CM | POA: Diagnosis not present

## 2015-10-05 ENCOUNTER — Ambulatory Visit (INDEPENDENT_AMBULATORY_CARE_PROVIDER_SITE_OTHER): Payer: Medicare Other | Admitting: Otolaryngology

## 2015-10-05 DIAGNOSIS — H903 Sensorineural hearing loss, bilateral: Secondary | ICD-10-CM | POA: Diagnosis not present

## 2015-10-10 ENCOUNTER — Encounter: Payer: Self-pay | Admitting: Cardiology

## 2015-10-17 ENCOUNTER — Encounter: Payer: Self-pay | Admitting: Neurology

## 2015-10-17 ENCOUNTER — Ambulatory Visit (INDEPENDENT_AMBULATORY_CARE_PROVIDER_SITE_OTHER): Payer: Medicare Other | Admitting: Neurology

## 2015-10-17 VITALS — BP 122/76 | HR 83 | Ht 71.0 in | Wt 237.4 lb

## 2015-10-17 DIAGNOSIS — R251 Tremor, unspecified: Secondary | ICD-10-CM | POA: Diagnosis not present

## 2015-10-17 DIAGNOSIS — G250 Essential tremor: Secondary | ICD-10-CM

## 2015-10-17 MED ORDER — PRIMIDONE 50 MG PO TABS: 25.0000 mg | ORAL_TABLET | Freq: Every day | ORAL | Status: DC

## 2015-10-17 NOTE — Progress Notes (Addendum)
[facility]    Provider:  Dr Jaynee Eagles Referring Provider: Celene Squibb, MD Primary Care Physician:  Wende Neighbors, MD  CC:  tremor  HPI:  [name] is a 81 y.o. male here as a referral from Dr. [place] Crane for tremor. PMHx CKD, Type 2 DM, HLD, HTN, insomnia, OSA, depression, polyneuropathy, cerebral ischemia, b12 deficiency, mantle cell lymphoma, anxiety, parkinson's diseas (?). He goes by the name [name]. Tremor has been going on for over a year. Worsening. Some days he doesn't have it. Today is a good day. When he has it, it is continuous, both resting and postural. He is weak all over, he has pain all over, he went to physical therapy and improved. He has been taking carbidopa/levodopa and it has not helped the tremor, he got it from a doctor in [place]. Tremor started more than year. Getting worse. He has a difficult time writing mostly. No difficulty eating, denies excessive coffee. Nothing makes it better or unknown.  Grandmother had a tremor, she had it severely and also her head shook and she shook all over. Sister has a similar tremor. He has trouble turning pages due to the tremor. Slowly progressive. No other focal neurologic deficits.   Review of outside records; HgbA1c 6.5, cmp 08/24/2015 creatinine 1.28 bun 18 gfr 54 Cbc with 3.2 wbc and anemia 11.4/36.7  Review of Systems: Patient complains of symptoms per HPI as well as the following symptoms: weight gain, easy bruising, easy beeding, SOB, cough, swelling in legs, ringing in ears, cramps, aching musckes, allergies, runnynose, weaknes, decrease energy, change in appetite, restless legs. Pertinent negatives per HPI. All others negative.   Social History   Social History  . Marital Status: Married    Spouse Name: [name]  . Number of Children: 2  . Years of Education: 8   Occupational History  . Retired     Social History Main Topics  . Smoking status: Never Smoker   . Smokeless tobacco: Never Used  .  Alcohol Use: No  . Drug Use: No  . Sexual Activity: Yes    Birth Control/ Protection: None   Other Topics Concern  . Not on file   Social History Narrative   Lives with wife   Caffeine use: none    Family History  Problem Relation Age of Onset  . Colon cancer Brother   . Cancer Brother   . Diabetes Father   . Cancer Brother     Past Medical History  Diagnosis Date  . Type 2 diabetes mellitus ([place])   . 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 Mixed hyperlipidemia   . PVD (peripheral vascular disease) ([place])   . Essential hypertension, benign   . Prostate cancer ([place])   . Colon cancer ([place])   . B12 deficiency 02/07/2014  . Obstructive sleep apnea     does not use  . GERD (gastroesophageal reflux disease)     Past Surgical History  Procedure Laterality Date  . Tonsillectomy    . Back surgery    . Vasectomy    . Knee surgery Right     total knee  . Shoulder surgery    . Prostatectomy    . Colonoscopy with esophagogastroduodenoscopy (egd) N/A 08/27/2013    Procedure: COLONOSCOPY WITH ESOPHAGOGASTRODUODENOSCOPY (EGD);  Surgeon: Rogene Houston, MD;  [facility];  Service: Endoscopy;  Laterality: N/A;  730  . Balloon dilation N/A 08/27/2013    Procedure: BALLOON DILATION;  Surgeon: Rogene Houston, MD;  [facility];  Service: Endoscopy;  Laterality: N/A;  Venia Minks dilation N/A 08/27/2013    Procedure: Venia Minks DILATION;  Surgeon: Rogene Houston, MD;  [facility];  Service: Endoscopy;  Laterality: N/A;  . Savory dilation N/A 08/27/2013    Procedure: SAVORY DILATION;  Surgeon: Rogene Houston, MD;  [facility];  Service: Endoscopy;  Laterality: N/A;  . Portacath placement Right 2014  . Colonoscopy N/A 12/17/2013    Procedure: COLONOSCOPY;  Surgeon: Rogene Houston, MD;  [facility];  Service: Endoscopy;  Laterality: N/A;  940   . Colon surgery    . Knee arthroscopy Left   . Removal of port Right   . Colonoscopy N/A 02/28/2014    Procedure: COLONOSCOPY;  Surgeon: Rogene Houston, MD;  [facility];  Service: Endoscopy;  Laterality: N/A;  730  . Colonoscopy N/A 08/04/2015    Procedure: COLONOSCOPY;  Surgeon: Rogene Houston, MD;  [facility];  Service: Endoscopy;  Laterality: N/A;  200 - moved to 11/11 @ 2:10 - Ann to notify pt    Current Outpatient Prescriptions  Medication Sig Dispense Refill  . acetaminophen (TYLENOL) 325 MG tablet Take 650 mg by mouth every 4 (four) hours as needed for mild pain or moderate pain. Take 2 tablets 1 hour prior to Rituxan treatment.    . Cyanocobalamin (VITAMIN B-12 IJ) Inject as directed every 30 (thirty) days.     . fluticasone (FLONASE) 50 MCG/ACT nasal spray Place 1 spray into both nostrils 2 (two) times daily.    . insulin glargine (LANTUS SOLOSTAR) 100 UNIT/ML injection Inject 50 Units into the skin every morning.     . insulin lispro (HUMALOG) 100 UNIT/ML injection Inject 10 Units into the skin daily as needed for high blood sugar.    . lidocaine-prilocaine (EMLA) cream Apply 1 application topically daily as needed (chemo). Apply a quarter size amount to port site 1 hour prior to chemo. Do not rub in. Cover with plastic wrap.    . lisinopril (PRINIVIL,ZESTRIL) 40 MG tablet Take 40 mg by mouth daily.    . metFORMIN (GLUCOPHAGE) 1000 MG tablet Take 1,000 mg by mouth 2 (two) times daily with a meal.     . METRONIDAZOLE, TOPICAL, 0.75 % LOTN Apply 1 application topically as needed.    . pantoprazole (PROTONIX) 40 MG tablet Take 1 tablet (40 mg total) by mouth 2 (two) times daily before a meal. 60 tablet 5  . prednisoLONE acetate (PRED FORTE) 1 % ophthalmic suspension Place 1 drop into both eyes as needed.    [name] PROAIR HFA 108 (90 Base) MCG/ACT inhaler Inhale 2 puffs into the lungs 3 (three) times daily as needed.    . sulfacetamide (BLEPH-10) 10 % ophthalmic  solution Place 1 drop into both eyes daily as needed (irritation).     . [DISCONTINUED] insulin aspart (NOVOLOG) 100 UNIT/ML injection Inject 20 Units into the skin 3 (three) times daily before meals.      . [DISCONTINUED] rosuvastatin (CRESTOR) 40 MG tablet Take 40 mg by mouth daily.       No current facility-administered medications for this visit.    Allergies as of 10/17/2015 - Review Complete 10/17/2015  Allergen Reaction Noted  . Bee venom Anaphylaxis   . Adhesive [tape] Hives   . Ciprofloxacin    .  Codeine    . Iodine    . Latex    . Povidone-iodine  10/15/2010  . Simvastatin      Vitals: Ht '5\' 11"'$  (1.803 m)  Wt 237 lb 6.4 oz (107.684 kg)  BMI 33.13 kg/m2 Last Weight:  Wt Readings from Last 1 Encounters:  10/17/15 237 lb 6.4 oz (107.684 kg)   Last Height:   Ht Readings from Last 1 Encounters:  10/17/15 '5\' 11"'$  (1.803 m)     Physical exam: Exam: Gen: NAD, conversant, well nourised, obese, well groomed                     CV: RRR, no MRG. No Carotid Bruits. No peripheral edema, warm, nontender Eyes: Conjunctivae clear without exudates or hemorrhage  Neuro: Detailed Neurologic Exam  Speech:    Speech is normal; fluent and spontaneous with normal comprehension.  Cognition:    The patient is oriented to person, place, and time;     recent and remote memory appear intact;     language fluent;     normal attention, concentration,     fund of knowledge appears grosly intact Cranial Nerves:    The pupils are equal, round, and reactive to light. The fundi are normal and spontaneous venous pulsations are present. Visual fields are full to finger confrontation. Extraocular movements are intact. Trigeminal sensation is intact and the muscles of mastication are normal. The face is symmetric. The palate elevates in the midline. Hearing intact. Voice is normal. Shoulder shrug is normal. The tongue has normal motion without fasciculations.   Coordination:    Normal finger  to nose and heel to shin.   Gait:    Mildly wide based  Motor Observation:    No asymmetry, no atrophy, High frequency low amplitude tremor with postural with action components Tone:    Normal muscle tone.  No cogwheeling.   Posture:    Posture is normal. normal erect    Strength:    Strength is V/V in the upper and lower limbs.      Sensation: dec pin prick, temp to the knees. Absent vibration at the toes.      Reflex Exam:  DTR's:    Deep tendon reflexes in the upper and lower extremities are hypo bilaterally.   Toes:    The toes are downgoing bilaterally.   Clonus:    Clonus is absent.       Assessment/Plan:  81 year old patient with what appears to be a familial essential tremor. Grandmother and sister have tremor. He has a low amplitude, high frequency tremor. I don't see any signs of parkinsonism, he does not shuffle, tone is normal, no resting tremor, not bradykinetic. I think this is an essential tremor. He was given sinemet from another doctor which didn't help because I don't think he has PD. Will see if primidone will help.   Will try Mysoline at night. Will start very low. Other agents to try include propranolol, neurontin, topamax. Mysoline is a first line agent. He needs to watch for sedation and be careful not to fall at ngiht. Will start low and increase slowly. Watch for dizziness, drowsiness, spinning sensation; nausea, vomiting, loss of appetite; feeling irritable; blurred vision; mild skin rash; or. impotence, loss of interest in sex. Stop for anything concerning.  Memory loss: Will discuss at a future appointment.    Sarina Ill, MD  [facility] [address] [place], [place] [address]  Phone  [phone] Fax [phone]

## 2015-10-17 NOTE — Patient Instructions (Signed)
Overall you are doing fairly well but I do want to suggest a few things today:   Remember to drink plenty of fluid, eat healthy meals and do not skip any meals. Try to eat protein with a every meal and eat a healthy snack such as fruit or nuts in between meals. Try to keep a regular sleep-wake schedule and try to exercise daily, particularly in the form of walking, 20-30 minutes a day, if you can.   As far as your medications are concerned, I would like to suggest: Primidone/Mysoline for Essential Tremor. Start with 1/2 pill at night before bed and call us in a few weeks if you want to increase it.   As far as diagnostic testing: lab  I would like to see you back in 3 months, sooner if we need to. Please call us with any interim questions, concerns, problems, updates or refill requests.   Our phone number is [phone]. We also have an after hours call service for urgent matters and there is a physician on-call for urgent questions. For any emergencies you know to call 911 or go to the nearest emergency room

## 2015-10-18 ENCOUNTER — Other Ambulatory Visit: Payer: Self-pay | Admitting: Neurology

## 2015-10-18 ENCOUNTER — Telehealth: Payer: Self-pay | Admitting: *Deleted

## 2015-10-18 ENCOUNTER — Telehealth: Payer: Self-pay | Admitting: Neurology

## 2015-10-18 DIAGNOSIS — G250 Essential tremor: Secondary | ICD-10-CM

## 2015-10-18 LAB — THYROID PANEL WITH TSH
Free Thyroxine Index: 1.5 (ref 1.2–4.9)
T3 Uptake Ratio: 29 % (ref 24–39)
T4 TOTAL: 5.3 ug/dL (ref 4.5–12.0)
TSH: 2.03 u[IU]/mL (ref 0.450–4.500)

## 2015-10-18 MED ORDER — GABAPENTIN 300 MG PO CAPS: 300.0000 mg | ORAL_CAPSULE | Freq: Three times a day (TID) | ORAL | Status: DC

## 2015-10-18 NOTE — Telephone Encounter (Signed)
Patient is calling. He was seen in our office yesterday and given a Rx for primidone (MYSOLINE) 50 MG tablet. The patient took 1/2 a pill at bedtime and during the night becme very weak and sick. He says he has been throwing up all morning but seems to be feeling some better now because he took  PromethazineHCL 89m for nausea. Is there another Rx he can try?  [facility] and advise. Thank you.

## 2015-10-18 NOTE — Telephone Encounter (Signed)
Will try him on gabapentin '300mg'$  three times a day. Please discuss common side effects with him thanks. The most common is sedation, I would try it at home and not drive until he know how it affects him thanks

## 2015-10-18 NOTE — Telephone Encounter (Signed)
Called pt and relayed information below per Dr Jaynee Eagles note. Went over common SE of medication: sleepiness, dizziness, fatigue. Told him to call back if he experiences any SE. Verified his pharmacy. He verbalized understanding.

## 2015-10-18 NOTE — Telephone Encounter (Signed)
-----   Message from Melvenia Beam, MD sent at 10/18/2015 10:35 AM EST ----- Thyroid is normal thanks

## 2015-10-18 NOTE — Telephone Encounter (Signed)
Called and spoke to pt about normal thyroid per Dr Jaynee Eagles. He verbalized understanding.

## 2015-10-20 DIAGNOSIS — B351 Tinea unguium: Secondary | ICD-10-CM | POA: Diagnosis not present

## 2015-10-20 DIAGNOSIS — E1142 Type 2 diabetes mellitus with diabetic polyneuropathy: Secondary | ICD-10-CM | POA: Diagnosis not present

## 2015-10-25 ENCOUNTER — Encounter (HOSPITAL_COMMUNITY): Payer: Medicare Other | Attending: Hematology and Oncology

## 2015-10-25 ENCOUNTER — Encounter (HOSPITAL_COMMUNITY): Payer: Medicare Other

## 2015-10-25 VITALS — BP 142/85 | HR 92 | Temp 98.0°F | Resp 20

## 2015-10-25 DIAGNOSIS — E538 Deficiency of other specified B group vitamins: Secondary | ICD-10-CM | POA: Diagnosis present

## 2015-10-25 DIAGNOSIS — C8310 Mantle cell lymphoma, unspecified site: Secondary | ICD-10-CM | POA: Insufficient documentation

## 2015-10-25 MED ORDER — HEPARIN SOD (PORK) LOCK FLUSH 100 UNIT/ML IV SOLN
500.0000 [IU] | Freq: Once | INTRAVENOUS | Status: AC
  Filled 2015-10-25: qty 5

## 2015-10-25 MED ORDER — SODIUM CHLORIDE 0.9% FLUSH: 20.0000 mL | INTRAVENOUS | Status: AC | PRN

## 2015-10-25 MED ORDER — CYANOCOBALAMIN 1000 MCG/ML IJ SOLN
1000.0000 ug | Freq: Once | INTRAMUSCULAR | Status: AC
Start: 2015-10-25 — End: 2015-10-25
  Administered 2015-10-25: 1000 ug via INTRAMUSCULAR

## 2015-10-25 MED ORDER — CYANOCOBALAMIN 1000 MCG/ML IJ SOLN
INTRAMUSCULAR | Status: AC
  Filled 2015-10-25: qty 1

## 2015-10-25 NOTE — Progress Notes (Signed)
[name] presents today for injection per MD orders. B12 1000mcg administered IM in left Upper Arm. Administration without incident. Patient tolerated well.  

## 2015-10-25 NOTE — Patient Instructions (Signed)
[place] at [facility] Discharge Instructions  RECOMMENDATIONS MADE BY THE CONSULTANT AND ANY TEST RESULTS WILL BE SENT TO YOUR REFERRING PHYSICIAN.  B12 today.     Thank you for choosing [place] at [facility] to provide your oncology and hematology care.  To afford each patient quality time with our provider, please arrive at least 15 minutes before your scheduled appointment time.   Beginning January 23rd 2017 lab work for the [facility] will be done in the  Main lab at [facility] on 1st floor. If you have a lab appointment with the [facility] please come in thru the  Main Entrance and check in at the main information desk  You need to re-schedule your appointment should you arrive 10 or more minutes late.  We strive to give you quality time with our providers, and arriving late affects you and other patients whose appointments are after yours.  Also, if you no show three or more times for appointments you may be dismissed from the clinic at the providers discretion.     Again, thank you for choosing [facility].  Our hope is that these requests will decrease the amount of time that you wait before being seen by our physicians.       _____________________________________________________________  Should you have questions after your visit to [facility], please contact our office at [phone] between the hours of 8:30 a.m. and 4:30 p.m.  Voicemails left after 4:30 p.m. will not be returned until the following business day.  For prescription refill requests, have your pharmacy contact our office.

## 2015-10-26 ENCOUNTER — Other Ambulatory Visit (HOSPITAL_COMMUNITY): Payer: Self-pay | Admitting: Internal Medicine

## 2015-10-26 ENCOUNTER — Ambulatory Visit (HOSPITAL_COMMUNITY)
Admission: RE | Admit: 2015-10-26 | Discharge: 2015-10-26 | Disposition: A | Discharge status: Home / Self Care | Payer: Medicare Other | Source: Ambulatory Visit | Attending: Internal Medicine | Admitting: Internal Medicine

## 2015-10-26 DIAGNOSIS — R0602 Shortness of breath: Secondary | ICD-10-CM | POA: Diagnosis not present

## 2015-10-26 DIAGNOSIS — R0989 Other specified symptoms and signs involving the circulatory and respiratory systems: Secondary | ICD-10-CM

## 2015-10-26 DIAGNOSIS — R05 Cough: Secondary | ICD-10-CM

## 2015-10-26 DIAGNOSIS — R062 Wheezing: Secondary | ICD-10-CM | POA: Diagnosis not present

## 2015-10-26 DIAGNOSIS — R059 Cough, unspecified: Secondary | ICD-10-CM

## 2015-10-26 DIAGNOSIS — J209 Acute bronchitis, unspecified: Secondary | ICD-10-CM | POA: Diagnosis not present

## 2015-10-31 ENCOUNTER — Telehealth (HOSPITAL_COMMUNITY): Payer: Self-pay | Admitting: Hematology & Oncology

## 2015-10-31 NOTE — Telephone Encounter (Signed)
Pt dropped off bills and questioned why the heparin and b12 inj where not getting paid. Per medicare guidelines those charges must be adjusted off.  Contacted Bobbie H and she adjusted the charges off.

## 2015-11-01 ENCOUNTER — Encounter: Payer: Self-pay | Admitting: Neurology

## 2015-11-24 DIAGNOSIS — J00 Acute nasopharyngitis [common cold]: Secondary | ICD-10-CM | POA: Diagnosis not present

## 2015-11-27 ENCOUNTER — Encounter (HOSPITAL_BASED_OUTPATIENT_CLINIC_OR_DEPARTMENT_OTHER): Payer: Medicare Other | Admitting: Hematology & Oncology

## 2015-11-27 ENCOUNTER — Encounter (HOSPITAL_BASED_OUTPATIENT_CLINIC_OR_DEPARTMENT_OTHER): Payer: Medicare Other

## 2015-11-27 ENCOUNTER — Encounter (HOSPITAL_COMMUNITY): Payer: Medicare Other | Attending: Hematology and Oncology

## 2015-11-27 ENCOUNTER — Encounter (HOSPITAL_COMMUNITY): Payer: Self-pay | Admitting: Hematology & Oncology

## 2015-11-27 VITALS — BP 120/72 | HR 80 | Temp 98.3°F | Resp 16

## 2015-11-27 VITALS — BP 125/73 | HR 91 | Temp 97.8°F | Resp 18 | Wt 236.0 lb

## 2015-11-27 DIAGNOSIS — Z5112 Encounter for antineoplastic immunotherapy: Secondary | ICD-10-CM

## 2015-11-27 DIAGNOSIS — C8319 Mantle cell lymphoma, extranodal and solid organ sites: Secondary | ICD-10-CM

## 2015-11-27 DIAGNOSIS — R739 Hyperglycemia, unspecified: Secondary | ICD-10-CM

## 2015-11-27 DIAGNOSIS — C8310 Mantle cell lymphoma, unspecified site: Secondary | ICD-10-CM | POA: Insufficient documentation

## 2015-11-27 DIAGNOSIS — Z95828 Presence of other vascular implants and grafts: Secondary | ICD-10-CM

## 2015-11-27 DIAGNOSIS — D61818 Other pancytopenia: Secondary | ICD-10-CM | POA: Diagnosis not present

## 2015-11-27 DIAGNOSIS — R5382 Chronic fatigue, unspecified: Secondary | ICD-10-CM | POA: Diagnosis not present

## 2015-11-27 DIAGNOSIS — E538 Deficiency of other specified B group vitamins: Secondary | ICD-10-CM | POA: Diagnosis not present

## 2015-11-27 DIAGNOSIS — E119 Type 2 diabetes mellitus without complications: Secondary | ICD-10-CM | POA: Diagnosis not present

## 2015-11-27 DIAGNOSIS — D696 Thrombocytopenia, unspecified: Secondary | ICD-10-CM

## 2015-11-27 DIAGNOSIS — C61 Malignant neoplasm of prostate: Secondary | ICD-10-CM

## 2015-11-27 LAB — COMPREHENSIVE METABOLIC PANEL
ALBUMIN: 3.8 g/dL (ref 3.5–5.0)
ALT: 20 U/L (ref 17–63)
AST: 22 U/L (ref 15–41)
Alkaline Phosphatase: 66 U/L (ref 38–126)
Anion gap: 8 (ref 5–15)
BUN: 30 mg/dL — ABNORMAL HIGH (ref 6–20)
CALCIUM: 8.6 mg/dL — AB (ref 8.9–10.3)
CHLORIDE: 108 mmol/L (ref 101–111)
CO2: 23 mmol/L (ref 22–32)
CREATININE: 1.66 mg/dL — AB (ref 0.61–1.24)
GFR calc Af Amer: 44 mL/min — ABNORMAL LOW (ref >60)
GFR, EST NON AFRICAN AMERICAN: 38 mL/min — AB (ref >60)
Glucose, Bld: 327 mg/dL — ABNORMAL HIGH (ref 65–99)
POTASSIUM: 4.2 mmol/L (ref 3.5–5.1)
Sodium: 139 mmol/L (ref 135–145)
TOTAL PROTEIN: 6.7 g/dL (ref 6.5–8.1)
Total Bilirubin: 0.8 mg/dL (ref 0.3–1.2)

## 2015-11-27 LAB — CBC WITH DIFFERENTIAL/PLATELET
BASOS ABS: 0 10*3/uL (ref 0.0–0.1)
BASOS PCT: 1 %
EOS ABS: 0.1 10*3/uL (ref 0.0–0.7)
EOS PCT: 2 %
HCT: 35.3 % — ABNORMAL LOW (ref 39.0–52.0)
Hemoglobin: 11.6 g/dL — ABNORMAL LOW (ref 13.0–17.0)
Lymphocytes Relative: 18 %
Lymphs Abs: 0.9 10*3/uL (ref 0.7–4.0)
MCH: 31.5 pg (ref 26.0–34.0)
MCHC: 32.9 g/dL (ref 30.0–36.0)
MCV: 95.9 fL (ref 78.0–100.0)
MONO ABS: 0.2 10*3/uL (ref 0.1–1.0)
Monocytes Relative: 4 %
Neutro Abs: 4 10*3/uL (ref 1.7–7.7)
Neutrophils Relative %: 75 %
PLATELETS: 97 10*3/uL — AB (ref 150–400)
RBC: 3.68 MIL/uL — ABNORMAL LOW (ref 4.22–5.81)
RDW: 15.8 % — AB (ref 11.5–15.5)
WBC: 5.3 10*3/uL (ref 4.0–10.5)

## 2015-11-27 LAB — LACTATE DEHYDROGENASE: LDH: 132 U/L (ref 98–192)

## 2015-11-27 MED ORDER — CYANOCOBALAMIN 1000 MCG/ML IJ SOLN
1000.0000 ug | Freq: Once | INTRAMUSCULAR | Status: AC
  Administered 2015-11-27: 1000 ug via INTRAMUSCULAR

## 2015-11-27 MED ORDER — CYANOCOBALAMIN 1000 MCG/ML IJ SOLN
INTRAMUSCULAR | Status: AC
  Filled 2015-11-27: qty 1

## 2015-11-27 MED ORDER — SODIUM CHLORIDE 0.9 % IJ SOLN: 10.0000 mL | INTRAMUSCULAR | Status: DC | PRN

## 2015-11-27 MED ORDER — ACETAMINOPHEN 325 MG PO TABS
ORAL_TABLET | ORAL | Status: AC
  Filled 2015-11-27: qty 2

## 2015-11-27 MED ORDER — DIPHENHYDRAMINE HCL 25 MG PO CAPS
50.0000 mg | ORAL_CAPSULE | Freq: Once | ORAL | Status: AC
  Administered 2015-11-27: 50 mg via ORAL

## 2015-11-27 MED ORDER — DIPHENHYDRAMINE HCL 25 MG PO CAPS
ORAL_CAPSULE | ORAL | Status: AC
Start: 2015-11-27 — End: 2015-11-27
  Filled 2015-11-27: qty 2

## 2015-11-27 MED ORDER — HEPARIN SOD (PORK) LOCK FLUSH 100 UNIT/ML IV SOLN
500.0000 [IU] | Freq: Once | INTRAVENOUS | Status: AC | PRN
  Administered 2015-11-27: 500 [IU]
  Filled 2015-11-27: qty 5

## 2015-11-27 MED ORDER — SODIUM CHLORIDE 0.9 % IV SOLN
500.0000 mg/m2 | Freq: Once | INTRAVENOUS | Status: AC
  Administered 2015-11-27: 1100 mg via INTRAVENOUS
  Filled 2015-11-27: qty 100

## 2015-11-27 MED ORDER — SODIUM CHLORIDE 0.9 % IV SOLN
Freq: Once | INTRAVENOUS | Status: AC
  Administered 2015-11-27: 09:00:00 via INTRAVENOUS

## 2015-11-27 MED ORDER — HEPARIN SOD (PORK) LOCK FLUSH 100 UNIT/ML IV SOLN
INTRAVENOUS | Status: AC
  Filled 2015-11-27: qty 5

## 2015-11-27 MED ORDER — INSULIN ASPART 100 UNIT/ML ~~LOC~~ SOLN
8.0000 [IU] | Freq: Once | SUBCUTANEOUS | Status: AC
  Administered 2015-11-27: 8 [IU] via SUBCUTANEOUS
  Filled 2015-11-27: qty 0.08

## 2015-11-27 MED ORDER — ACETAMINOPHEN 325 MG PO TABS
650.0000 mg | ORAL_TABLET | Freq: Once | ORAL | Status: AC
  Administered 2015-11-27: 650 mg via ORAL

## 2015-11-27 NOTE — Progress Notes (Signed)
Please see other encounter for documentation.

## 2015-11-27 NOTE — Patient Instructions (Signed)
[facility] Discharge Instructions for Patients Receiving Chemotherapy   Beginning January 23rd 2017 lab work for the [facility] will be done in the  Main lab at [facility] on 1st floor. If you have a lab appointment with the [facility] please come in thru the  Main Entrance and check in at the main information desk   Today you received the following chemotherapy agent: Rituxan. Please return as scheduled.     If you develop nausea and vomiting, or diarrhea that is not controlled by your medication, call the clinic.  The clinic phone number is [phone]. Office hours are Monday-Friday 8:30am-5:00pm.  BELOW ARE SYMPTOMS THAT SHOULD BE REPORTED IMMEDIATELY:  *FEVER GREATER THAN 101.0 F  *CHILLS WITH OR WITHOUT FEVER  NAUSEA AND VOMITING THAT IS NOT CONTROLLED WITH YOUR NAUSEA MEDICATION  *UNUSUAL SHORTNESS OF BREATH  *UNUSUAL BRUISING OR BLEEDING  TENDERNESS IN MOUTH AND THROAT WITH OR WITHOUT PRESENCE OF ULCERS  *URINARY PROBLEMS  *BOWEL PROBLEMS  UNUSUAL RASH Items with * indicate a potential emergency and should be followed up as soon as possible. If you have an emergency after office hours please contact your primary care physician or go to the nearest emergency department.  Please call the clinic during office hours if you have any questions or concerns.   You may also contact the Patient Navigator at [phone] should you have any questions or need assistance in obtaining follow up care.      Resources For Cancer Patients and their Caregivers ? American Cancer Society: Can assist with transportation, wigs, general needs, runs Look Good Feel Better.        5182987813 ? Cancer Care: Provides financial assistance, online support groups, medication/co-pay assistance.  1-800-813-HOPE (445)039-2713) ? [place] Assists [place] cancer patients and their families through emotional , educational and financial  support.  6128700187 ? [place] DSS Where to apply for food stamps, Medicaid and utility assistance. (519) 162-2757 ? [facility]: Transportation to medical appointments. 216-239-4449 ? Social Security Administration: May apply for disability if have a Stage IV cancer. (812)079-1010 925 622 8971 ? [employer], Disability and Transit Services: Assists with nutrition, care and transit needs. 870-097-1916

## 2015-11-27 NOTE — Progress Notes (Signed)
Brandon Neighbors, MD [name] [id number]   DIAGNOSIS: Mantle Cell lymphoma of the large intestine diagnosed 08/2013 Colonoscopy on 02/28/2014 no evidence of disease   SUMMARY OF ONCOLOGIC HISTORY:   Mantle cell lymphoma ([place])   08/27/2013 Initial Diagnosis Colon, biopsy, random - MANTLE CELL LYMPHOMA   09/06/2013 Imaging CT CAP- No significant lymphadenopathy identified within the chest, abdomen or pelvis.   09/14/2013 - 02/07/2014 Chemotherapy Bendamustine/Rituxan x 6 cycles with Neulasta support   12/17/2013 Procedure Colonoscopy with biopsy by Dr. Laural Golden.   12/17/2013 Pathology Results Colon, biopsy, ascending and transverse - BENIGN APPEARING COLONIC MUCOSA WITH SCATTERED ATYPICAL LYMPHOCYTES.Overall, these findings are suspicious for minimal residual mantle cell lymphoma.   02/28/2014 Procedure Colonoscopy with biopsy by Dr. Laural Golden   02/28/2014 Pathology Results Colon, biopsy, proximal and distal - BENIGN COLONIC MUCOSA. - NO SIGNIFICANT INFLAMMATION OR OTHER ABNORMALITIES IDENTIFIED. - NO EVIDENCE OF LYMPHOMA, ADENOMATOUS CHANGES OR MALIGNANCY.   02/28/2014 Remission No evidence of malignancy on colonosocpy and pathology from biopsy.   05/10/2014 -  Chemotherapy Maintenance Rituxan 500 mg/m cycle 1   06/30/2015 - 07/02/2015 Hospital Admission AKI   08/04/2015 Procedure Colonoscopy with biopsy by Dr. Laural Golden.   08/04/2015 Pathology Results Colon, biopsy, right and left. - BENIGN COLORECTAL MUCOSA. - ASSOCIATED BENIGN LYMPHOID AGGREGATES. - NO EVIDENCE OF SIGNIFICANT INFLAMMATION, DYSPLASIA OR MALIGNANCY.    CURRENT THERAPY: Rituxan   INTERVAL HISTORY: [name] 81 y.o. male returns for follow-up of his mantle cell lymphoma.  He is doing well. He has gained back all of his weight and because of his diabetes that frustrates him some.  He denies abdominal pain, nausea, fever, cough or chills.  [name] was here alone and was in bed receiving treatment. He was here today  to get cycle #8 of Rituximab.  His blood sugar was high today and he stated that this was because he ate two homemade biscuits that his wife made today and some milk. He also said that he had not taken his insulin shot, but he did take his other pills this morning.   He stated that he has been eating good and he has been feeling pretty well. He also said that he has been gaining weight.   He said that he had a cold in December and that he is not quite over it yet. He also said he had a ear and throat infection that he got through his wife and [employer] baby. He said he is now better overall from his cold and that his breathing is pretty well but he is wheezing a little bit.   He stated that his bowels are good and that they are the same.   He visited Dr. Nevada Crane last week and he gave him a "long white pill" but he forgot the name of it and the other medicines that he was given but he takes them every morning. He said that his assistant increased the mg.   He said that Dr. Laural Golden said that he does not need a colonoscopy anytime soon. He has no other complaints.  MEDICAL HISTORY: Past Medical History  Diagnosis Date  . Type 2 diabetes mellitus ([place])   . 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 Mixed hyperlipidemia   . PVD (peripheral vascular disease) ([place])   . Essential hypertension, benign   . Prostate cancer ([place])   .  Colon cancer ([place])   . B12 deficiency 02/07/2014  . Obstructive sleep apnea     does not use  . GERD (gastroesophageal reflux disease)     has Mixed hyperlipidemia; HYPERTENSION, BENIGN; Coronary atherosclerosis of native coronary artery; VASOMOTOR RHINITIS; GERD; OSA (obstructive sleep apnea); H/O ulcerative colitis; Mantle cell lymphoma ([place]); Prostate cancer ([place]); Proctitis, radiation; Abdominal pain, left lower quadrant; Abdominal pain, unspecified site; Lower GI bleed;  Diverticulitis; Leukopenia; Diverticulosis of colon with hemorrhage; Thrombocytopenia, unspecified ([place]); Acute blood loss anemia; B12 deficiency; AKI (acute kidney injury) ([place]); Acute kidney injury ([place]); CKD (chronic kidney disease) stage 3, GFR 30-59 ml/min; and Essential tremor on his problem list.     is allergic to bee venom; adhesive; ciprofloxacin; codeine; iodine; latex; povidone-iodine; and simvastatin.  [name] does not currently have medications on file.  SURGICAL HISTORY: Past Surgical History  Procedure Laterality Date  . Tonsillectomy    . Back surgery    . Vasectomy    . Knee surgery Right     total knee  . Shoulder surgery    . Prostatectomy    . Colonoscopy with esophagogastroduodenoscopy (egd) N/A 08/27/2013    Procedure: COLONOSCOPY WITH ESOPHAGOGASTRODUODENOSCOPY (EGD);  Surgeon: Rogene Houston, MD;  [facility];  Service: Endoscopy;  Laterality: N/A;  730  . Balloon dilation N/A 08/27/2013    Procedure: BALLOON DILATION;  Surgeon: Rogene Houston, MD;  [facility];  Service: Endoscopy;  Laterality: N/A;  Venia Minks dilation N/A 08/27/2013    Procedure: Venia Minks DILATION;  Surgeon: Rogene Houston, MD;  [facility];  Service: Endoscopy;  Laterality: N/A;  . Savory dilation N/A 08/27/2013    Procedure: SAVORY DILATION;  Surgeon: Rogene Houston, MD;  [facility];  Service: Endoscopy;  Laterality: N/A;  . Portacath placement Right 2014  . Colonoscopy N/A 12/17/2013    Procedure: COLONOSCOPY;  Surgeon: Rogene Houston, MD;  [facility];  Service: Endoscopy;  Laterality: N/A;  940  . Colon surgery    . Knee arthroscopy Left   . Removal of port Right   . Colonoscopy N/A 02/28/2014    Procedure: COLONOSCOPY;  Surgeon: Rogene Houston, MD;  [facility];  Service: Endoscopy;  Laterality: N/A;  730  . Colonoscopy N/A 08/04/2015    Procedure: COLONOSCOPY;  Surgeon: Rogene Houston, MD;  [facility];  Service: Endoscopy;  Laterality: N/A;  200 - moved to 11/11 @ 2:10 - Ann to notify pt    SOCIAL HISTORY: Social History   Social History  . Marital Status: Married    Spouse Name: [name]  . Number of Children: 2  . Years of Education: 8   Occupational History  . Retired     Social History Main Topics  . Smoking status: Never Smoker   . Smokeless tobacco: Never Used  . Alcohol Use: No  . Drug Use: No  . Sexual Activity: Yes    Birth Control/ Protection: None   Other Topics Concern  . Not on file   Social History Narrative   Lives with wife   Caffeine use: none    FAMILY HISTORY: Family History  Problem Relation Age of Onset  . Colon cancer Brother   . Cancer Brother   . Diabetes Father   . Cancer Brother     Review of Systems  Constitutional: Positive for appetite loss and weight gain.  HENT: Negative.   Eyes:  Negative.   Respiratory:  Negative.       Chronic cough. Cardiovascular: Negative.   Gastrointestinal: Negative.   Genitourinary: Negative.   Musculoskeletal: Negative.   Skin: Negative.   Neurological: Positive for tremors. Negative for dizziness, tingling, sensory change, speech change, focal weakness, seizures and loss of consciousness.      Tremor in hands and arms. Endo/Heme/Allergies: Negative.   Psychiatric/Behavioral: Negative.   14 point review of systems was performed and is negative except as detailed under history of present illness and above   PHYSICAL EXAMINATION  ECOG PERFORMANCE STATUS: 1 - Symptomatic but completely ambulatory  Filed Vitals:   11/27/15 0908  BP: 125/73  Pulse: 91  Temp: 97.8 F (36.6 C)  Resp: 18    Physical Exam  Constitutional: He is oriented to person, place, and time and well-developed, well-nourished, and in no distress.  HENT:  Head: Normocephalic and atraumatic.  Nose: Nose normal.  Mouth/Throat: Oropharynx is clear and moist. No oropharyngeal exudate.  Eyes: Conjunctivae and EOM are  normal. Pupils are equal, round, and reactive to light. Right eye exhibits no discharge. Left eye exhibits no discharge. No scleral icterus.  Neck: Normal range of motion. Neck supple. No tracheal deviation present. No thyromegaly present.  Cardiovascular: Normal rate, regular rhythm and normal heart sounds.  Exam reveals no gallop and no friction rub.   No murmur heard. Pulmonary/Chest: Positive for occasional expiratory wheezing. Effort normal and breath sounds normal.  He has no rales.  Abdominal: Soft. Bowel sounds are normal. He exhibits no distension and no mass. There is no tenderness. There is no rebound and no guarding.  Musculoskeletal: Normal range of motion. He exhibits no edema.  Lymphadenopathy:       Head (right side): No submental and no submandibular adenopathy present.       Head (left side): No submental and no submandibular adenopathy present.    He has no cervical adenopathy.       Right axillary: No pectoral and no lateral adenopathy present.       Left axillary: No pectoral and no lateral adenopathy present.      Right: No inguinal and no supraclavicular adenopathy present.       Left: No inguinal and no supraclavicular adenopathy present.  Neurological: He is alert and oriented to person, place, and time. He has normal reflexes. No cranial nerve deficit. Gait normal. Coordination normal.  Skin: Skin is warm and dry. No rash noted.  Psychiatric: Mood, memory, affect and judgment normal.  Nursing note and vitals reviewed.  LABORATORY DATA: I have reviewed the data as listed  CBC    Component Value Date/Time   WBC 5.3 11/27/2015 0630   RBC 3.68* 11/27/2015 0630   RBC 3.55* 08/02/2014 0825   HGB 11.6* 11/27/2015 0630   HCT 35.3* 11/27/2015 0630   PLT 97* 11/27/2015 0630   MCV 95.9 11/27/2015 0630   MCH 31.5 11/27/2015 0630   MCHC 32.9 11/27/2015 0630   RDW 15.8* 11/27/2015 0630   LYMPHSABS 0.9 11/27/2015 0630   MONOABS 0.2 11/27/2015 0630   EOSABS 0.1  11/27/2015 0630   BASOSABS 0.0 11/27/2015 0630   CMP     Component Value Date/Time   NA 139 11/27/2015 0630   K 4.2 11/27/2015 0630   CL 108 11/27/2015 0630   CO2 23 11/27/2015 0630   GLUCOSE 327* 11/27/2015 0630   BUN 30* 11/27/2015 0630   CREATININE 1.66* 11/27/2015 0630   CREATININE 1.17 11/28/2014 0955  CALCIUM 8.6* 11/27/2015 0630   PROT 6.7 11/27/2015 0630   ALBUMIN 3.8 11/27/2015 0630   AST 22 11/27/2015 0630   ALT 20 11/27/2015 0630   ALKPHOS 66 11/27/2015 0630   BILITOT 0.8 11/27/2015 0630   GFRNONAA 38* 11/27/2015 0630   GFRAA 44* 11/27/2015 0630    RADIOLOGY:  Study Result     CLINICAL DATA: Cough and congestion  EXAM: CHEST 2 VIEW  COMPARISON: 05/11/2015  FINDINGS: Cardiac shadow is within normal limits. A right chest wall port is again seen and stable. No focal infiltrate or sizable effusion is noted. Postsurgical changes are seen in the cervical spine.  IMPRESSION: No active cardiopulmonary disease.   Electronically Signed  By: Inez Catalina M.D.  On: 10/26/2015 17:03    CLINICAL DATA: 81 year old male with history of colon cancer diagnosed 2014 status post chemotherapy. History prostate cancer status post surgical resection and radiation therapy.  EXAM: CT CHEST, ABDOMEN AND PELVIS WITHOUT CONTRAST IMPRESSION: 1. No definite findings to suggest metastatic disease in the chest, abdomen or pelvis. 2. There are several new patchy areas of ground-glass attenuation in the lungs bilaterally which are highly nonspecific. The possibility of developing interstitial lung disease is not excluded, and repeat high-resolution chest CT is suggested in 6-12 months to assess for temporal changes in the appearance of the lung parenchyma. 3. Atherosclerosis, including 2 vessel coronary artery disease. Assessment for potential risk factor modification, dietary therapy or pharmacologic therapy may be warranted, if clinically indicated. 4.  Cholelithiasis without evidence of acute cholecystitis at this time. 5. Colonic diverticulosis without evidence of acute diverticulitis at this time. 6. Additional incidental findings, as above.   Electronically Signed  By: Vinnie Langton M.D.  On: 05/11/2015 14:11   ASSESSMENT and THERAPY PLAN:  Mantle cell lymphoma Chronic fatigue Tremor Pancytopenia, thrombocytopenia   He will continue with maintenance Rituxan. His appetite is good and he denies abdominal pain, or change in appetite. He has no evidence of recurrence.  His pancytopenia is chronic and stable. We will continue with observation.   He was given insulin for his hyperglycemia today.  I encouraged him to remember to take his insulin daily.   He return for a follow up in 2 months.   All questions were answered. The patient knows to call the clinic with any problems, questions or concerns. We can certainly see the patient much sooner if necessary.  This document serves as a record of services personally performed by Ancil Linsey, MD. It was created on her behalf by Kandace Blitz, a trained medical scribe. The creation of this record is based on the scribe's personal observations and the provider's statements to them. This document has been checked and approved by the attending provider.  I have reviewed the above documentation for accuracy and completeness, and I agree with the above.  This note was electronically signed.  Ancil Linsey, MD

## 2015-11-27 NOTE — Patient Instructions (Addendum)
[place] at [facility] Discharge Instructions  RECOMMENDATIONS MADE BY THE CONSULTANT AND ANY TEST RESULTS WILL BE SENT TO YOUR REFERRING PHYSICIAN.   Exam and discussion by Dr Whitney Muse today Blood sugar is high today, insulin today Labs in 1 month  Monthly b12 Rituxan every 2 months Return to see the doctor in 2 months  Please call the clinic if you have any questions or concerns    Thank you for choosing [place] at [facility] to provide your oncology and hematology care.  To afford each patient quality time with our provider, please arrive at least 15 minutes before your scheduled appointment time.   Beginning January 23rd 2017 lab work for the [facility] will be done in the  Main lab at [facility] on 1st floor. If you have a lab appointment with the [facility] please come in thru the  Main Entrance and check in at the main information desk  You need to re-schedule your appointment should you arrive 10 or more minutes late.  We strive to give you quality time with our providers, and arriving late affects you and other patients whose appointments are after yours.  Also, if you no show three or more times for appointments you may be dismissed from the clinic at the providers discretion.     Again, thank you for choosing [facility].  Our hope is that these requests will decrease the amount of time that you wait before being seen by our physicians.       _____________________________________________________________  Should you have questions after your visit to [facility], please contact our office at [phone] between the hours of 8:30 a.m. and 4:30 p.m.  Voicemails left after 4:30 p.m. will not be returned until the following business day.  For prescription refill requests, have your pharmacy contact our office.         Resources For Cancer Patients and their Caregivers ? American Cancer  Society: Can assist with transportation, wigs, general needs, runs Look Good Feel Better.        959-492-9160 ? Cancer Care: Provides financial assistance, online support groups, medication/co-pay assistance.  1-800-813-HOPE 2140941775) ? [facility] Assists [place] cancer patients and their families through emotional , educational and financial support.  947-176-5845 ? [place] DSS Where to apply for food stamps, Medicaid and utility assistance. 680 577 9766 ? RCATS: Transportation to medical appointments. 905 713 7612 ? Social Security Administration: May apply for disability if have a Stage IV cancer. 564-329-8893 215-080-5939 ? [employer], Disability and Transit Services: Assists with nutrition, care and transit needs. [phone]

## 2015-11-27 NOTE — Progress Notes (Signed)
[name] presents today for injection per MD orders. B12 1028mg administered IM in right Upper Arm. Administration without incident. Patient tolerated well. Patient tolerated infusion well.  VSS post transfusion and throughout.

## 2015-12-07 ENCOUNTER — Ambulatory Visit (INDEPENDENT_AMBULATORY_CARE_PROVIDER_SITE_OTHER): Payer: Medicare Other | Admitting: Otolaryngology

## 2015-12-07 DIAGNOSIS — R05 Cough: Secondary | ICD-10-CM

## 2015-12-07 DIAGNOSIS — H6983 Other specified disorders of Eustachian tube, bilateral: Secondary | ICD-10-CM

## 2015-12-08 DIAGNOSIS — Z8546 Personal history of malignant neoplasm of prostate: Secondary | ICD-10-CM | POA: Diagnosis not present

## 2015-12-08 DIAGNOSIS — N359 Urethral stricture, unspecified: Secondary | ICD-10-CM | POA: Diagnosis not present

## 2015-12-08 DIAGNOSIS — N200 Calculus of kidney: Secondary | ICD-10-CM | POA: Diagnosis not present

## 2015-12-08 DIAGNOSIS — N393 Stress incontinence (female) (male): Secondary | ICD-10-CM | POA: Diagnosis not present

## 2015-12-08 DIAGNOSIS — E538 Deficiency of other specified B group vitamins: Secondary | ICD-10-CM | POA: Diagnosis not present

## 2015-12-08 DIAGNOSIS — N451 Epididymitis: Secondary | ICD-10-CM | POA: Diagnosis not present

## 2015-12-08 DIAGNOSIS — E291 Testicular hypofunction: Secondary | ICD-10-CM | POA: Diagnosis not present

## 2015-12-08 DIAGNOSIS — N281 Cyst of kidney, acquired: Secondary | ICD-10-CM | POA: Diagnosis not present

## 2015-12-08 DIAGNOSIS — Z08 Encounter for follow-up examination after completed treatment for malignant neoplasm: Secondary | ICD-10-CM | POA: Diagnosis not present

## 2015-12-15 DIAGNOSIS — D519 Vitamin B12 deficiency anemia, unspecified: Secondary | ICD-10-CM | POA: Diagnosis not present

## 2015-12-15 DIAGNOSIS — G20 Parkinson's disease: Secondary | ICD-10-CM | POA: Diagnosis not present

## 2015-12-15 DIAGNOSIS — N182 Chronic kidney disease, stage 2 (mild): Secondary | ICD-10-CM | POA: Diagnosis not present

## 2015-12-15 DIAGNOSIS — E1122 Type 2 diabetes mellitus with diabetic chronic kidney disease: Secondary | ICD-10-CM | POA: Diagnosis not present

## 2015-12-28 ENCOUNTER — Encounter (HOSPITAL_COMMUNITY): Payer: Medicare Other

## 2015-12-28 ENCOUNTER — Encounter (HOSPITAL_COMMUNITY): Payer: Medicare Other | Attending: Hematology and Oncology

## 2015-12-28 VITALS — BP 140/77 | HR 79 | Temp 98.2°F | Resp 16

## 2015-12-28 DIAGNOSIS — E538 Deficiency of other specified B group vitamins: Secondary | ICD-10-CM | POA: Diagnosis present

## 2015-12-28 DIAGNOSIS — N179 Acute kidney failure, unspecified: Secondary | ICD-10-CM

## 2015-12-28 DIAGNOSIS — C8310 Mantle cell lymphoma, unspecified site: Secondary | ICD-10-CM | POA: Insufficient documentation

## 2015-12-28 DIAGNOSIS — D62 Acute posthemorrhagic anemia: Secondary | ICD-10-CM

## 2015-12-28 DIAGNOSIS — N183 Chronic kidney disease, stage 3 unspecified: Secondary | ICD-10-CM

## 2015-12-28 DIAGNOSIS — C61 Malignant neoplasm of prostate: Secondary | ICD-10-CM

## 2015-12-28 DIAGNOSIS — D696 Thrombocytopenia, unspecified: Secondary | ICD-10-CM

## 2015-12-28 LAB — CBC WITH DIFFERENTIAL/PLATELET
BASOS ABS: 0 10*3/uL (ref 0.0–0.1)
BASOS PCT: 1 %
EOS ABS: 0.2 10*3/uL (ref 0.0–0.7)
Eosinophils Relative: 4 %
HCT: 36.4 % — ABNORMAL LOW (ref 39.0–52.0)
HEMOGLOBIN: 11.7 g/dL — AB (ref 13.0–17.0)
Lymphocytes Relative: 17 %
Lymphs Abs: 0.8 10*3/uL (ref 0.7–4.0)
MCH: 30.8 pg (ref 26.0–34.0)
MCHC: 32.1 g/dL (ref 30.0–36.0)
MCV: 95.8 fL (ref 78.0–100.0)
Monocytes Absolute: 0.3 10*3/uL (ref 0.1–1.0)
Monocytes Relative: 5 %
NEUTROS ABS: 3.7 10*3/uL (ref 1.7–7.7)
NEUTROS PCT: 74 %
Platelets: 101 10*3/uL — ABNORMAL LOW (ref 150–400)
RBC: 3.8 MIL/uL — AB (ref 4.22–5.81)
RDW: 15.6 % — ABNORMAL HIGH (ref 11.5–15.5)
WBC: 5 10*3/uL (ref 4.0–10.5)

## 2015-12-28 MED ORDER — CYANOCOBALAMIN 1000 MCG/ML IJ SOLN
1000.0000 ug | Freq: Once | INTRAMUSCULAR | Status: AC
  Administered 2015-12-28: 1000 ug via INTRAMUSCULAR

## 2015-12-28 MED ORDER — CYANOCOBALAMIN 1000 MCG/ML IJ SOLN
INTRAMUSCULAR | Status: AC
  Filled 2015-12-28: qty 1

## 2015-12-28 NOTE — Patient Instructions (Signed)
[place] at [facility] Discharge Instructions  RECOMMENDATIONS MADE BY THE CONSULTANT AND ANY TEST RESULTS WILL BE SENT TO YOUR REFERRING PHYSICIAN.  B12 today.    Thank you for choosing [place] at [facility] to provide your oncology and hematology care.  To afford each patient quality time with our provider, please arrive at least 15 minutes before your scheduled appointment time.   Beginning January 23rd 2017 lab work for the [facility] will be done in the  Main lab at [facility] on 1st floor. If you have a lab appointment with the [facility] please come in thru the  Main Entrance and check in at the main information desk  You need to re-schedule your appointment should you arrive 10 or more minutes late.  We strive to give you quality time with our providers, and arriving late affects you and other patients whose appointments are after yours.  Also, if you no show three or more times for appointments you may be dismissed from the clinic at the providers discretion.     Again, thank you for choosing [facility].  Our hope is that these requests will decrease the amount of time that you wait before being seen by our physicians.       _____________________________________________________________  Should you have questions after your visit to [facility], please contact our office at [phone] between the hours of 8:30 a.m. and 4:30 p.m.  Voicemails left after 4:30 p.m. will not be returned until the following business day.  For prescription refill requests, have your pharmacy contact our office.         Resources For Cancer Patients and their Caregivers ? American Cancer Society: Can assist with transportation, wigs, general needs, runs Look Good Feel Better.        540-116-7611 ? Cancer Care: Provides financial assistance, online support groups, medication/co-pay assistance.  1-800-813-HOPE  938-635-0584) ? [facility] Assists [place] cancer patients and their families through emotional , educational and financial support.  305-058-4966 ? [place] DSS Where to apply for food stamps, Medicaid and utility assistance. 213-836-7920 ? RCATS: Transportation to medical appointments. (980) 777-2201 ? Social Security Administration: May apply for disability if have a Stage IV cancer. (623)138-8861 209-373-7118 ? [employer], Disability and Transit Services: Assists with nutrition, care and transit needs. 250-294-5746

## 2015-12-28 NOTE — Progress Notes (Signed)
[name] presents today for injection per MD orders. B12 1023mg administered IM in right Upper Arm. Administration without incident. Patient tolerated well.

## 2015-12-29 DIAGNOSIS — E1142 Type 2 diabetes mellitus with diabetic polyneuropathy: Secondary | ICD-10-CM | POA: Diagnosis not present

## 2015-12-29 DIAGNOSIS — B351 Tinea unguium: Secondary | ICD-10-CM | POA: Diagnosis not present

## 2016-01-04 DIAGNOSIS — E782 Mixed hyperlipidemia: Secondary | ICD-10-CM | POA: Diagnosis not present

## 2016-01-04 DIAGNOSIS — E119 Type 2 diabetes mellitus without complications: Secondary | ICD-10-CM | POA: Diagnosis not present

## 2016-01-04 DIAGNOSIS — E291 Testicular hypofunction: Secondary | ICD-10-CM | POA: Diagnosis not present

## 2016-01-08 DIAGNOSIS — N182 Chronic kidney disease, stage 2 (mild): Secondary | ICD-10-CM | POA: Diagnosis not present

## 2016-01-08 DIAGNOSIS — E782 Mixed hyperlipidemia: Secondary | ICD-10-CM | POA: Diagnosis not present

## 2016-01-08 DIAGNOSIS — E1122 Type 2 diabetes mellitus with diabetic chronic kidney disease: Secondary | ICD-10-CM | POA: Diagnosis not present

## 2016-01-08 DIAGNOSIS — D631 Anemia in chronic kidney disease: Secondary | ICD-10-CM | POA: Diagnosis not present

## 2016-01-18 ENCOUNTER — Ambulatory Visit (INDEPENDENT_AMBULATORY_CARE_PROVIDER_SITE_OTHER): Payer: Medicare Other | Admitting: Otolaryngology

## 2016-01-18 DIAGNOSIS — J310 Chronic rhinitis: Secondary | ICD-10-CM | POA: Diagnosis not present

## 2016-01-18 DIAGNOSIS — H6983 Other specified disorders of Eustachian tube, bilateral: Secondary | ICD-10-CM

## 2016-01-22 ENCOUNTER — Ambulatory Visit (INDEPENDENT_AMBULATORY_CARE_PROVIDER_SITE_OTHER): Payer: Medicare Other | Admitting: Neurology

## 2016-01-22 ENCOUNTER — Encounter: Payer: Self-pay | Admitting: Neurology

## 2016-01-22 VITALS — BP 134/78 | HR 81 | Ht 71.0 in | Wt 243.2 lb

## 2016-01-22 DIAGNOSIS — R413 Other amnesia: Secondary | ICD-10-CM

## 2016-01-22 DIAGNOSIS — G3184 Mild cognitive impairment, so stated: Secondary | ICD-10-CM

## 2016-01-22 DIAGNOSIS — R251 Tremor, unspecified: Secondary | ICD-10-CM

## 2016-01-22 MED ORDER — PROPRANOLOL HCL 20 MG PO TABS: ORAL_TABLET | ORAL | Status: DC

## 2016-01-22 NOTE — Patient Instructions (Addendum)
Overall you are doing fairly well but I do want to suggest a few things today:   Remember to drink plenty of fluid, eat healthy meals and do not skip any meals. Try to eat protein with a every meal and eat a healthy snack such as fruit or nuts in between meals. Try to keep a regular sleep-wake schedule and try to exercise daily, particularly in the form of walking, 20-30 minutes a day, if you can.   As far as your medications are concerned, I would like to suggest Start Propranolol at '20mg'$ (one pill) twice daily. Increase after 2 wks, check blood pressure daily, do not increase if BP below 110/60 or pulse below 60  As far as diagnostic testing: MRI of the brain, labs  I would like to see you back in 3-4 months, sooner if we need to. Please call us with any interim questions, concerns, problems, updates or refill requests.     Our phone number is [phone]. We also have an after hours call service for urgent matters and there is a physician on-call for urgent questions. For any emergencies you know to call 911 or go to the nearest emergency room

## 2016-01-22 NOTE — Progress Notes (Signed)
[id number]    Provider: Dr Jaynee Eagles Referring Provider: Celene Squibb, MD Primary Care Physician: Wende Neighbors, MD  CC: tremor  Interval history: primidone and neurontin had side effects. Discussed propranolol and that this is heart medication, we need to be very mindful of his pulse and blood pressure and this can cause significant decreases in both, he should take his blood pressure and pulse daily, do not take if BP less than 11/60 or pulse < 60, stop for anything concerning whatsoever including SOB, CP. Discussed his memory, He forgetting conversation, things wife tells you, people's names, no dementia in the family. Been going on slowly progressive for at least 2 years. He takes b12 shots. He used to have a cpap and stopped. He is tired during the day. He snores, his wife doesn't. Last sleep study was 4-5 years ago. He is Morbidly obese. Epworth sleep scale only 5/10, doesn't appear to be candidate for sleep stufy. Discussed neurocognitive testing as well.  HPI: [name] is a 81 y.o. male here as a referral from Dr. [place] Crane for tremor. PMHx CKD, Type 2 DM, HLD, HTN, insomnia, OSA, depression, polyneuropathy, cerebral ischemia, b12 deficiency, mantle cell lymphoma, anxiety, parkinson's diseas (?). He goes by the name [name]. Tremor has been going on for over a year. Worsening. Some days he doesn't have it. Today is a good day. When he has it, it is continuous, both resting and postural. He is weak all over, he has pain all over, he went to physical therapy and improved. He has been taking carbidopa/levodopa and it has not helped the tremor, he got it from a doctor in [place]. Tremor started more than year. Getting worse. He has a difficult time writing mostly. No difficulty eating, denies excessive coffee. Nothing makes it better or unknown. Grandmother had a tremor, she had it severely and also her head shook and she shook all over. Sister has a similar tremor. He  has trouble turning pages due to the tremor. Slowly progressive. No other focal neurologic deficits.   Review of outside records; HgbA1c 6.5, cmp 08/24/2015 creatinine 1.28 bun 18 gfr 54 Cbc with 3.2 wbc and anemia 11.4/36.7  Review of Systems: Patient complains of symptoms per HPI as well as the following symptoms: weight gain, easy bruising, easy beeding, SOB, cough, swelling in legs, ringing in ears, cramps, aching musckes, allergies, runnynose, weaknes, decrease energy, change in appetite, restless legs. Pertinent negatives per HPI. All others negative.   Social History   Social History  . Marital Status: Married    Spouse Name: [name]  . Number of Children: 2  . Years of Education: 8   Occupational History  . Retired     Social History Main Topics  . Smoking status: Never Smoker   . Smokeless tobacco: Never Used  . Alcohol Use: No  . Drug Use: No  . Sexual Activity: Yes    Birth Control/ Protection: None   Other Topics Concern  . Not on file   Social History Narrative   Lives with wife   Caffeine use: none    Family History  Problem Relation Age of Onset  . Colon cancer Brother   . Cancer Brother   . Diabetes Father   . Cancer Brother     Past Medical History  Diagnosis Date  . Type 2 diabetes mellitus ([place])   . Coronary atherosclerosis of native coronary artery     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, anomalous  circumflex - no PCIs  . Mixed hyperlipidemia   . PVD (peripheral vascular disease) ([place])   . Essential hypertension, benign   . Prostate cancer ([place])   . Colon cancer ([place])   . B12 deficiency 02/07/2014  . Obstructive sleep apnea     does not use  . GERD (gastroesophageal reflux disease)     Past Surgical History  Procedure Laterality Date  . Tonsillectomy    . Back surgery    . Vasectomy    . Knee surgery Right     total knee  . Shoulder surgery    . Prostatectomy    . Colonoscopy with esophagogastroduodenoscopy (egd) N/A  08/27/2013    Procedure: COLONOSCOPY WITH ESOPHAGOGASTRODUODENOSCOPY (EGD);  Surgeon: Rogene Houston, MD;  [facility];  Service: Endoscopy;  Laterality: N/A;  730  . Balloon dilation N/A 08/27/2013    Procedure: BALLOON DILATION;  Surgeon: Rogene Houston, MD;  [facility];  Service: Endoscopy;  Laterality: N/A;  Venia Minks dilation N/A 08/27/2013    Procedure: Venia Minks DILATION;  Surgeon: Rogene Houston, MD;  [facility];  Service: Endoscopy;  Laterality: N/A;  . Savory dilation N/A 08/27/2013    Procedure: SAVORY DILATION;  Surgeon: Rogene Houston, MD;  [facility];  Service: Endoscopy;  Laterality: N/A;  . Portacath placement Right 2014  . Colonoscopy N/A 12/17/2013    Procedure: COLONOSCOPY;  Surgeon: Rogene Houston, MD;  [facility];  Service: Endoscopy;  Laterality: N/A;  940  . Colon surgery    . Knee arthroscopy Left   . Removal of port Right   . Colonoscopy N/A 02/28/2014    Procedure: COLONOSCOPY;  Surgeon: Rogene Houston, MD;  [facility];  Service: Endoscopy;  Laterality: N/A;  730  . Colonoscopy N/A 08/04/2015    Procedure: COLONOSCOPY;  Surgeon: Rogene Houston, MD;  [facility];  Service: Endoscopy;  Laterality: N/A;  200 - moved to 11/11 @ 2:10 - Ann to notify pt    Current Outpatient Prescriptions  Medication Sig Dispense Refill  . ACCU-CHEK AVIVA PLUS test strip   1  . acetaminophen (TYLENOL) 325 MG tablet Take 650 mg by mouth every 4 (four) hours as needed for mild pain or moderate pain. Take 2 tablets 1 hour prior to Rituxan treatment.    . Cyanocobalamin (VITAMIN B-12 IJ) Inject as directed every 30 (thirty) days.     . fluticasone (FLONASE) 50 MCG/ACT nasal spray Place 1 spray into both nostrils 2 (two) times daily.    [name] gabapentin (NEURONTIN) 300 MG capsule Take 1 capsule (300 mg total) by mouth 3 (three) times daily. 90 capsule 11  . glipiZIDE (GLUCOTROL) 10 MG tablet Take 10 mg by  mouth.    . insulin glargine (LANTUS SOLOSTAR) 100 UNIT/ML injection Inject 50 Units into the skin every morning.     . insulin lispro (HUMALOG) 100 UNIT/ML injection Inject 10 Units into the skin daily as needed for high blood sugar.    . Lancets (ACCU-CHEK MULTICLIX) lancets   1  . lidocaine-prilocaine (EMLA) cream Apply 1 application topically daily as needed (chemo). Apply a quarter size amount to port site 1 hour prior to chemo. Do not rub in. Cover with plastic wrap.    . lisinopril (PRINIVIL,ZESTRIL) 40 MG tablet Take 40 mg by mouth daily.    . metFORMIN (GLUCOPHAGE) 1000 MG tablet Take 1,000 mg by mouth 2 (two) times  daily with a meal.     . METRONIDAZOLE, TOPICAL, 0.75 % LOTN Apply 1 application topically as needed.    . pantoprazole (PROTONIX) 40 MG tablet Take 1 tablet (40 mg total) by mouth 2 (two) times daily before a meal. 60 tablet 5  . prednisoLONE acetate (PRED FORTE) 1 % ophthalmic suspension Place 1 drop into both eyes as needed.    . primidone (MYSOLINE) 50 MG tablet Take 0.5 tablets (25 mg total) by mouth at bedtime. 30 tablet 11  . PROAIR HFA 108 (90 Base) MCG/ACT inhaler Inhale 2 puffs into the lungs 3 (three) times daily as needed.    . sulfacetamide (BLEPH-10) 10 % ophthalmic solution Place 1 drop into both eyes daily as needed (irritation).     . [DISCONTINUED] insulin aspart (NOVOLOG) 100 UNIT/ML injection Inject 20 Units into the skin 3 (three) times daily before meals.      . [DISCONTINUED] rosuvastatin (CRESTOR) 40 MG tablet Take 40 mg by mouth daily.       No current facility-administered medications for this visit.   Facility-Administered Medications Ordered in Other Visits  Medication Dose Route Frequency Provider Last Rate Last Dose  . heparin lock flush 100 unit/mL  500 Units Intravenous Once Baird Cancer, PA-C      . sodium chloride flush (NS) 0.9 % injection 20 mL  20 mL Intravenous PRN Baird Cancer, PA-C        Allergies as of 01/22/2016 - Review  Complete 01/22/2016  Allergen Reaction Noted  . Bee venom Anaphylaxis   . Adhesive [tape] Hives   . Ciprofloxacin    . Codeine    . Iodine    . Latex    . Povidone-iodine  10/15/2010  . Simvastatin      Vitals: BP 134/78 mmHg  Pulse 81  Ht '5\' 11"'$  (1.803 m)  Wt 243 lb 3.2 oz (110.315 kg)  BMI 33.93 kg/m2 Last Weight:  Wt Readings from Last 1 Encounters:  01/22/16 243 lb 3.2 oz (110.315 kg)   Last Height:   Ht Readings from Last 1 Encounters:  01/22/16 '5\' 11"'$  (1.803 m)    Physical exam: Exam: Gen: NAD, conversant, well nourised, obese, well groomed  CV: RRR, no MRG. No Carotid Bruits. No peripheral edema, warm, nontender Eyes: Conjunctivae clear without exudates or hemorrhage  Neuro: Detailed Neurologic Exam  Speech:  Speech is normal; fluent and spontaneous with normal comprehension.  Cognition: Montreal Cognitive Assessment  01/22/2016  Visuospatial/ Executive (0/5) 4  Naming (0/3) 3  Attention: Read list of digits (0/2) 2  Attention: Read list of letters (0/1) 1  Attention: Serial 7 subtraction starting at 100 (0/3) 2  Language: Repeat phrase (0/2) 2  Language : Fluency (0/1) 0  Abstraction (0/2) 2  Delayed Recall (0/5) 1  Orientation (0/6) 6  Total 23  Adjusted Score (based on education) 24     The patient is oriented to person, place, and time;   recent and remote memory appear intact;   language fluent;   normal attention, concentration,   fund of knowledge appears grosly intact Cranial Nerves:  The pupils are equal, round, and reactive to light. The fundi are normal and spontaneous venous pulsations are present. Visual fields are full to finger confrontation. Extraocular movements are intact. Trigeminal sensation is intact and the muscles of mastication are normal. The face is symmetric. The palate elevates in the midline. Hearing intact. Voice is normal. Shoulder shrug is normal. The tongue has normal motion  without fasciculations.   Coordination:  Normal finger to nose and heel to shin.   Gait:  Mildly wide based  Motor Observation:  No asymmetry, no atrophy, High frequency low amplitude tremor with postural with action components Tone:  Normal muscle tone. No cogwheeling.   Posture:  Posture is normal. normal erect   Strength:  Strength is V/V in the upper and lower limbs.    Sensation: dec pin prick, temp to the knees. Absent vibration at the toes.    Reflex Exam:  DTR's:  Deep tendon reflexes in the upper and lower extremities are hypo bilaterally.  Toes:  The toes are downgoing bilaterally.  Clonus:  Clonus is absent.      Assessment/Plan: 81 year old patient with what appears to be a familial essential tremor. Grandmother and sister have tremor. He has a low amplitude, high frequency tremor. I don't see any signs of parkinsonism, he does not shuffle, tone is normal, no resting tremor, not bradykinetic. I think this is an essential tremor. He was given sinemet from another doctor which didn't help because I don't think he has PD. Had side effects to primidone and neurontin. Have not tried propranolol to date because of risks in the elderly, hypotension and bradycardia, discussed it at length and he wants to try it.   - He is still having a tremor. Will start very low dose propranolol, he is on several  other blood pressure medications and want to start very low. Discussed side effects at length due to risk factors in the elderly: Common propranolol side effects may include:nausea, vomiting, diarrhea, constipation, stomach cramps; decreased sex drive, impotence, or difficulty having an orgasm;sleep problems (insomnia); or tired feeling. Do NOT get pregnant on this medication.Stop immediately for: slow or uneven heartbeats;a light-headed feeling, like you might pass out;wheezing or trouble breathing;shortness of breath (even with mild exertion),  swelling, rapid weight gain;.sudden weakness, vision problems, or loss of coordination (especially in a child with hemangioma that affects the face or head);cold feeling in your hands and feet;depression, confusion, hallucinations;liver problems - nausea, upper stomach pain, itching, tired feeling, loss of appetite, dark urine, clay-colored stools, jaundice (yellowing of the skin or eyes);low blood sugar - headache, hunger, weakness, sweating, confusion, irritability, dizziness, fast heart rate, or feeling jittery;low blood sugar in a baby - pale skin, blue or purple skin, sweating, fussiness, crying, not wanting to eat, feeling cold, drowsiness, weak or shallow breathing (breathing may stop for short periods), seizure (convulsions), or loss of consciousness; or severe skin reaction - fever, sore throat, swelling in your face or tongue, burning in your eyes, skin pain, followed by a red or purple skin rash that spreads (especially in the face or upper body) and causes blistering and peeling.  - Memory loss: Labs and MRI of the brain. Can consider Aricept at next appointment. MoCA 24/30, mild cognitive impairment.He takes vitamin D daily and B12 shots, continue. Epworth sleep scale 5/10, doesn't appear to need a sleep eval for OSA.  Sarina Ill, MD  [facility] [address] [place], [place] [address]  Phone [phone] Fax [phone]  A total of 40 minutes was spent face-to-face with this patient. Over half this time was spent on counseling patient on the essential tremor and mild cognitive impairment diagnosis and different diagnostic and therapeutic options available.

## 2016-01-26 ENCOUNTER — Ambulatory Visit (HOSPITAL_COMMUNITY): Payer: Medicare Other

## 2016-01-26 ENCOUNTER — Ambulatory Visit (HOSPITAL_COMMUNITY): Payer: Medicare Other | Admitting: Oncology

## 2016-01-26 ENCOUNTER — Inpatient Hospital Stay (HOSPITAL_COMMUNITY): Payer: Medicare Other

## 2016-01-26 ENCOUNTER — Other Ambulatory Visit (HOSPITAL_COMMUNITY): Payer: Medicare Other

## 2016-01-29 ENCOUNTER — Encounter (HOSPITAL_COMMUNITY): Payer: Medicare Other

## 2016-01-29 ENCOUNTER — Encounter (HOSPITAL_COMMUNITY): Payer: Medicare Other | Attending: Hematology and Oncology | Admitting: Oncology

## 2016-01-29 ENCOUNTER — Encounter (HOSPITAL_BASED_OUTPATIENT_CLINIC_OR_DEPARTMENT_OTHER): Payer: Medicare Other

## 2016-01-29 ENCOUNTER — Encounter (HOSPITAL_COMMUNITY): Payer: Self-pay

## 2016-01-29 VITALS — BP 139/80 | HR 65 | Temp 97.7°F | Resp 18 | Wt 244.6 lb

## 2016-01-29 DIAGNOSIS — E538 Deficiency of other specified B group vitamins: Secondary | ICD-10-CM

## 2016-01-29 DIAGNOSIS — C8310 Mantle cell lymphoma, unspecified site: Secondary | ICD-10-CM

## 2016-01-29 DIAGNOSIS — G250 Essential tremor: Secondary | ICD-10-CM

## 2016-01-29 DIAGNOSIS — C61 Malignant neoplasm of prostate: Secondary | ICD-10-CM

## 2016-01-29 DIAGNOSIS — Z5112 Encounter for antineoplastic immunotherapy: Secondary | ICD-10-CM

## 2016-01-29 DIAGNOSIS — C8319 Mantle cell lymphoma, extranodal and solid organ sites: Secondary | ICD-10-CM

## 2016-01-29 LAB — COMPREHENSIVE METABOLIC PANEL
ALT: 19 U/L (ref 17–63)
AST: 20 U/L (ref 15–41)
Albumin: 4 g/dL (ref 3.5–5.0)
Alkaline Phosphatase: 51 U/L (ref 38–126)
Anion gap: 9 (ref 5–15)
BUN: 27 mg/dL — AB (ref 6–20)
CHLORIDE: 106 mmol/L (ref 101–111)
CO2: 28 mmol/L (ref 22–32)
CREATININE: 1.4 mg/dL — AB (ref 0.61–1.24)
Calcium: 9 mg/dL (ref 8.9–10.3)
GFR calc Af Amer: 54 mL/min — ABNORMAL LOW (ref >60)
GFR calc non Af Amer: 47 mL/min — ABNORMAL LOW (ref >60)
Glucose, Bld: 142 mg/dL — ABNORMAL HIGH (ref 65–99)
POTASSIUM: 4.1 mmol/L (ref 3.5–5.1)
SODIUM: 143 mmol/L (ref 135–145)
Total Bilirubin: 0.7 mg/dL (ref 0.3–1.2)
Total Protein: 6.7 g/dL (ref 6.5–8.1)

## 2016-01-29 LAB — CBC WITH DIFFERENTIAL/PLATELET
Basophils Absolute: 0 10*3/uL (ref 0.0–0.1)
Basophils Relative: 1 %
EOS ABS: 0.1 10*3/uL (ref 0.0–0.7)
EOS PCT: 3 %
HCT: 37.1 % — ABNORMAL LOW (ref 39.0–52.0)
Hemoglobin: 12 g/dL — ABNORMAL LOW (ref 13.0–17.0)
LYMPHS ABS: 1 10*3/uL (ref 0.7–4.0)
Lymphocytes Relative: 27 %
MCH: 31 pg (ref 26.0–34.0)
MCHC: 32.3 g/dL (ref 30.0–36.0)
MCV: 95.9 fL (ref 78.0–100.0)
MONOS PCT: 5 %
Monocytes Absolute: 0.2 10*3/uL (ref 0.1–1.0)
Neutro Abs: 2.4 10*3/uL (ref 1.7–7.7)
Neutrophils Relative %: 64 %
PLATELETS: 113 10*3/uL — AB (ref 150–400)
RBC: 3.87 MIL/uL — ABNORMAL LOW (ref 4.22–5.81)
RDW: 15 % (ref 11.5–15.5)
WBC: 3.7 10*3/uL — ABNORMAL LOW (ref 4.0–10.5)

## 2016-01-29 MED ORDER — SODIUM CHLORIDE 0.9 % IV SOLN
500.0000 mg/m2 | Freq: Once | INTRAVENOUS | Status: AC
  Administered 2016-01-29: 1100 mg via INTRAVENOUS
  Filled 2016-01-29: qty 100

## 2016-01-29 MED ORDER — CYANOCOBALAMIN 1000 MCG/ML IJ SOLN
1000.0000 ug | Freq: Once | INTRAMUSCULAR | Status: AC
  Administered 2016-01-29: 1000 ug via INTRAMUSCULAR

## 2016-01-29 MED ORDER — HEPARIN SOD (PORK) LOCK FLUSH 100 UNIT/ML IV SOLN
500.0000 [IU] | Freq: Once | INTRAVENOUS | Status: AC | PRN
  Administered 2016-01-29: 500 [IU]

## 2016-01-29 MED ORDER — SODIUM CHLORIDE 0.9 % IJ SOLN: 10.0000 mL | INTRAMUSCULAR | Status: DC | PRN

## 2016-01-29 MED ORDER — CYANOCOBALAMIN 1000 MCG/ML IJ SOLN
INTRAMUSCULAR | Status: AC
  Filled 2016-01-29: qty 1

## 2016-01-29 MED ORDER — SODIUM CHLORIDE 0.9 % IV SOLN
Freq: Once | INTRAVENOUS | Status: AC
  Administered 2016-01-29: 09:00:00 via INTRAVENOUS

## 2016-01-29 MED ORDER — HEPARIN SOD (PORK) LOCK FLUSH 100 UNIT/ML IV SOLN
INTRAVENOUS | Status: AC
  Filled 2016-01-29: qty 5

## 2016-01-29 NOTE — Patient Instructions (Signed)
[name] at [facility] Discharge Instructions  RECOMMENDATIONS MADE BY THE CONSULTANT AND ANY TEST RESULTS WILL BE SENT TO YOUR REFERRING PHYSICIAN.  Exam done and seen today by Kirby Crigler PA_C Labs reviewed, treatment today Labs and treatment in 2 months Return to see the doctor in 2 months  Please call the clinic if you have any questions or concerns  Thank you for choosing [place] at [facility] to provide your oncology and hematology care.  To afford each patient quality time with our provider, please arrive at least 15 minutes before your scheduled appointment time.   Beginning January 23rd 2017 lab work for the [facility] will be done in the  Main lab at [facility] on 1st floor. If you have a lab appointment with the [facility] please come in thru the  Main Entrance and check in at the main information desk  You need to re-schedule your appointment should you arrive 10 or more minutes late.  We strive to give you quality time with our providers, and arriving late affects you and other patients whose appointments are after yours.  Also, if you no show three or more times for appointments you may be dismissed from the clinic at the providers discretion.     Again, thank you for choosing [facility].  Our hope is that these requests will decrease the amount of time that you wait before being seen by our physicians.       _____________________________________________________________  Should you have questions after your visit to [facility], please contact our office at [phone] between the hours of 8:30 a.m. and 4:30 p.m.  Voicemails left after 4:30 p.m. will not be returned until the following business day.  For prescription refill requests, have your pharmacy contact our office.         Resources For Cancer Patients and their Caregivers ? American Cancer Society: Can assist with  transportation, wigs, general needs, runs Look Good Feel Better.        906-089-4626 ? Cancer Care: Provides financial assistance, online support groups, medication/co-pay assistance.  1-800-813-HOPE 220-407-7891) ? [place] Assists [place] cancer patients and their families through emotional , educational and financial support.  207-347-7761 ? [place] DSS Where to apply for food stamps, Medicaid and utility assistance. (631) 188-6402 ? [facility]: Transportation to medical appointments. 828-873-6202 ? Social Security Administration: May apply for disability if have a Stage IV cancer. 586-634-2531 807-888-7140 ? [employer], Disability and Transit Services: Assists with nutrition, care and transit needs. [phone]

## 2016-01-29 NOTE — Assessment & Plan Note (Addendum)
Mantle Cell lymphoma of the large intestine diagnosed 08/2013, S/P BR x 6 cycles from 09/14/2013- 02/07/2014.  Now on maintenance Rituxan every 90 days beginning on 05/10/2014.  Colonoscopy on 02/28/2014 by Dr. Laural Golden demonstrated NED.  Oncology history is up to date.  He is here today for his 9/10 Rituxan maintenance treatment.  Chart is reviewed in detail and Dr. Cathren Laine notes (neurology) is reviewed in detail.  He is scheduled for an MRI of his brain tomorrow, 01/30/2016.  I have added a TSH to today's lab work at the patient request.  He notes that Dr. Jaynee Eagles wants to check his TSH and therefore, I have added this order to today's labs.  Pre-treatment labs today: CBC diff, CMET  Labs ordered for his next treatment as well: CBC diff, CMET  Return in 2 months for follow-up.

## 2016-01-29 NOTE — Progress Notes (Signed)
[name]'s reason for visit today is for an injection as scheduled per MD orders. [name] also received vitamin b12 injection in right deltoid per MD orders; see MAR for administration details.  [name] tolerated all procedures well and without incident; questions were answered and patient was discharged.   Pt tolerated rituxan infusion well today.  Discharged ambulatory

## 2016-01-29 NOTE — Progress Notes (Signed)
Brandon Neighbors, MD [place] [address]  Mantle cell lymphoma of solid organ excluding spleen [facility])  Essential tremor - Plan: TSH  CURRENT THERAPY:Rituxan maintenance every 60 days, today is cycle #9/10.  INTERVAL HISTORY: [name] 81 y.o. male returns for followup of Mantle Cell lymphoma of the large intestine diagnosed 08/2013, S/P BR x 6 cycles from 09/14/2013- 02/07/2014.  Now on maintenance Rituxan every 90 days beginning on 05/10/2014.  Colonoscopy on 02/28/2014 by Dr. Laural Golden demonstrated NED.    Mantle cell lymphoma ([place])   08/27/2013 Initial Diagnosis Colon, biopsy, random - MANTLE CELL LYMPHOMA   09/06/2013 Imaging CT CAP- No significant lymphadenopathy identified within the chest, abdomen or pelvis.   09/14/2013 - 02/07/2014 Chemotherapy Bendamustine/Rituxan x 6 cycles with Neulasta support   12/17/2013 Procedure Colonoscopy with biopsy by Dr. Laural Golden.   12/17/2013 Pathology Results Colon, biopsy, ascending and transverse - BENIGN APPEARING COLONIC MUCOSA WITH SCATTERED ATYPICAL LYMPHOCYTES.Overall, these findings are suspicious for minimal residual mantle cell lymphoma.   02/28/2014 Procedure Colonoscopy with biopsy by Dr. Laural Golden   02/28/2014 Pathology Results Colon, biopsy, proximal and distal - BENIGN COLONIC MUCOSA. - NO SIGNIFICANT INFLAMMATION OR OTHER ABNORMALITIES IDENTIFIED. - NO EVIDENCE OF LYMPHOMA, ADENOMATOUS CHANGES OR MALIGNANCY.   02/28/2014 Remission No evidence of malignancy on colonosocpy and pathology from biopsy.   05/10/2014 -  Chemotherapy Maintenance Rituxan 500 mg/m cycle 1   06/30/2015 - 07/02/2015 Hospital Admission AKI   08/04/2015 Procedure Colonoscopy with biopsy by Dr. Laural Golden.   08/04/2015 Pathology Results Colon, biopsy, right and left. - BENIGN COLORECTAL MUCOSA. - ASSOCIATED BENIGN LYMPHOID AGGREGATES. - NO EVIDENCE OF SIGNIFICANT INFLAMMATION, DYSPLASIA OR MALIGNANCY.    I personally reviewed and went over laboratory  results with the patient.  The results are noted within this dictation.  Labs will be updated today prior to Rituxan infusion.  His labs meet treatment parameters.  Chart reviewed.  He is following with Dr. Jaynee Eagles, Neurology, for essential tremor.  He was recently started on propanolol for this at low-dose with directions regarding the medication and his blood pressure.  He recently had some DM medication changes.  Past Medical History  Diagnosis Date  . Type 2 diabetes mellitus ([place])   . 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 Mixed hyperlipidemia   . PVD (peripheral vascular disease) ([place])   . Essential hypertension, benign   . Prostate cancer ([place])   . Colon cancer ([place])   . B12 deficiency 02/07/2014  . Obstructive sleep apnea     does not use  . GERD (gastroesophageal reflux disease)     has Mixed hyperlipidemia; HYPERTENSION, BENIGN; Coronary atherosclerosis of native coronary artery; VASOMOTOR RHINITIS; GERD; OSA (obstructive sleep apnea); H/O ulcerative colitis; Mantle cell lymphoma ([place]); Prostate cancer ([place]); Proctitis, radiation; Abdominal pain, left lower quadrant; Abdominal pain, unspecified site; Lower GI bleed; Diverticulitis; Leukopenia; Diverticulosis of colon with hemorrhage; Thrombocytopenia, unspecified ([place]); Acute blood loss anemia; B12 deficiency; AKI (acute kidney injury) ([place]); Acute kidney injury ([place]); CKD (chronic kidney disease) stage 3, GFR 30-59 ml/min; Essential tremor; and Mild cognitive impairment on his problem list.     is allergic to bee venom; adhesive; ciprofloxacin; codeine; iodine; latex; povidone-iodine; and simvastatin.  Current Outpatient Prescriptions on File Prior to Visit  Medication Sig Dispense Refill  . ACCU-CHEK AVIVA PLUS test strip   1  .  acetaminophen (TYLENOL) 325 MG tablet Take 650 mg by mouth every 4 (four) hours as needed for mild pain or  moderate pain. Take 2 tablets 1 hour prior to Rituxan treatment.    . Cyanocobalamin (VITAMIN B-12 IJ) Inject as directed every 30 (thirty) days.     . fluticasone (FLONASE) 50 MCG/ACT nasal spray Place 1 spray into both nostrils 2 (two) times daily.    [name] gabapentin (NEURONTIN) 300 MG capsule Take 1 capsule (300 mg total) by mouth 3 (three) times daily. 90 capsule 11  . glipiZIDE (GLUCOTROL) 10 MG tablet Take 10 mg by mouth.    . insulin glargine (LANTUS SOLOSTAR) 100 UNIT/ML injection Inject 50 Units into the skin every morning.     . insulin lispro (HUMALOG) 100 UNIT/ML injection Inject 10 Units into the skin daily as needed for high blood sugar.    . Lancets (ACCU-CHEK MULTICLIX) lancets   1  . lidocaine-prilocaine (EMLA) cream Apply 1 application topically daily as needed (chemo). Apply a quarter size amount to port site 1 hour prior to chemo. Do not rub in. Cover with plastic wrap.    . lisinopril (PRINIVIL,ZESTRIL) 40 MG tablet Take 40 mg by mouth daily.    . metFORMIN (GLUCOPHAGE) 1000 MG tablet Take 1,000 mg by mouth 2 (two) times daily with a meal.     . METRONIDAZOLE, TOPICAL, 0.75 % LOTN Apply 1 application topically as needed.    . pantoprazole (PROTONIX) 40 MG tablet Take 1 tablet (40 mg total) by mouth 2 (two) times daily before a meal. 60 tablet 5  . prednisoLONE acetate (PRED FORTE) 1 % ophthalmic suspension Place 1 drop into both eyes as needed.    . primidone (MYSOLINE) 50 MG tablet Take 0.5 tablets (25 mg total) by mouth at bedtime. 30 tablet 11  . PROAIR HFA 108 (90 Base) MCG/ACT inhaler Inhale 2 puffs into the lungs 3 (three) times daily as needed.    . propranolol (INDERAL) 20 MG tablet Start with '20mg'$  twice daily. Increase after 2 wks, check blood pressure daily, do not increase if BP below 110/60 or pulse below 60 120 tablet 6  . sulfacetamide (BLEPH-10) 10 % ophthalmic solution Place 1 drop into both eyes daily as needed (irritation).     . [DISCONTINUED] insulin aspart  (NOVOLOG) 100 UNIT/ML injection Inject 20 Units into the skin 3 (three) times daily before meals.      . [DISCONTINUED] rosuvastatin (CRESTOR) 40 MG tablet Take 40 mg by mouth daily.       Current Facility-Administered Medications on File Prior to Visit  Medication Dose Route Frequency Provider Last Rate Last Dose  . heparin lock flush 100 unit/mL  500 Units Intravenous Once Baird Cancer, PA-C      . sodium chloride flush (NS) 0.9 % injection 20 mL  20 mL Intravenous PRN Baird Cancer, PA-C        Past Surgical History  Procedure Laterality Date  . Tonsillectomy    . Back surgery    . Vasectomy    . Knee surgery Right     total knee  . Shoulder surgery    . Prostatectomy    . Colonoscopy with esophagogastroduodenoscopy (egd) N/A 08/27/2013    Procedure: COLONOSCOPY WITH ESOPHAGOGASTRODUODENOSCOPY (EGD);  Surgeon: Rogene Houston, MD;  [facility];  Service: Endoscopy;  Laterality: N/A;  730  . Balloon dilation N/A 08/27/2013    Procedure: BALLOON DILATION;  Surgeon: Rogene Houston, MD;  [facility]  [facility];  Service: Endoscopy;  Laterality: N/A;  Venia Minks dilation N/A 08/27/2013    Procedure: Venia Minks DILATION;  Surgeon: Rogene Houston, MD;  [facility];  Service: Endoscopy;  Laterality: N/A;  . Savory dilation N/A 08/27/2013    Procedure: SAVORY DILATION;  Surgeon: Rogene Houston, MD;  [facility];  Service: Endoscopy;  Laterality: N/A;  . Portacath placement Right 2014  . Colonoscopy N/A 12/17/2013    Procedure: COLONOSCOPY;  Surgeon: Rogene Houston, MD;  [facility];  Service: Endoscopy;  Laterality: N/A;  940  . Colon surgery    . Knee arthroscopy Left   . Removal of port Right   . Colonoscopy N/A 02/28/2014    Procedure: COLONOSCOPY;  Surgeon: Rogene Houston, MD;  [facility];  Service: Endoscopy;  Laterality: N/A;  730  . Colonoscopy N/A 08/04/2015    Procedure: COLONOSCOPY;  Surgeon: Rogene Houston, MD;   [facility];  Service: Endoscopy;  Laterality: N/A;  200 - moved to 11/11 @ 2:10 - Ann to notify pt    Denies any headaches, dizziness, double vision, fevers, chills, night sweats, nausea, vomiting, diarrhea, constipation, chest pain, heart palpitations, shortness of breath, blood in stool, black tarry stool, urinary pain, urinary burning, urinary frequency, hematuria.   PHYSICAL EXAMINATION  ECOG PERFORMANCE STATUS: 1 - Symptomatic but completely ambulatory  There were no vitals filed for this visit.  Blood pressure 130/68 Pulse 58 Respiration 16 Temperature 97.81F Oxygen saturation 97% on room air.  GENERAL:alert, no distress, well nourished, well developed, comfortable, cooperative, smiling and unaccompanied and in chemo-bed prepared for Rituxan therapy. SKIN: skin color, texture, turgor are normal, no rashes or significant lesions HEAD: Normocephalic, No masses, lesions, tenderness or abnormalities EYES: normal, EOMI, Conjunctiva are pink and non-injected EARS: External ears normal OROPHARYNX:lips, buccal mucosa, and tongue normal and mucous membranes are moist  NECK: supple, no adenopathy, trachea midline LYMPH:  no palpable lymphadenopathy, no hepatosplenomegaly BREAST:not examined LUNGS: Clear to ausculation bilaterally without wheezes, rales, or rhonchi. HEART: regular rate & rhythm, no murmurs, no gallops, S1 normal and S2 normal ABDOMEN:abdomen soft, non-tender, obese, normal bowel sounds and no masses or organomegaly BACK: Back symmetric, no curvature. EXTREMITIES:less then 2 second capillary refill, no joint deformities, effusion, or inflammation, no skin discoloration, no clubbing, no cyanosis  NEURO: alert & oriented x 3 with fluent speech, positive findings: rest tremor    LABORATORY DATA: CBC    Component Value Date/Time   WBC 3.7* 01/29/2016 0803   RBC 3.87* 01/29/2016 0803   RBC 3.55* 08/02/2014 0825   HGB 12.0* 01/29/2016 0803   HCT 37.1*  01/29/2016 0803   PLT 113* 01/29/2016 0803   MCV 95.9 01/29/2016 0803   MCH 31.0 01/29/2016 0803   MCHC 32.3 01/29/2016 0803   RDW 15.0 01/29/2016 0803   LYMPHSABS 1.0 01/29/2016 0803   MONOABS 0.2 01/29/2016 0803   EOSABS 0.1 01/29/2016 0803   BASOSABS 0.0 01/29/2016 0803      Chemistry      Component Value Date/Time   NA 143 01/29/2016 0803   K 4.1 01/29/2016 0803   CL 106 01/29/2016 0803   CO2 28 01/29/2016 0803   BUN 27* 01/29/2016 0803   CREATININE 1.40* 01/29/2016 0803   CREATININE 1.17 11/28/2014 0955      Component Value Date/Time   CALCIUM 9.0 01/29/2016 0803   ALKPHOS 51 01/29/2016 0803   AST 20 01/29/2016 0803  ALT 19 01/29/2016 0803   BILITOT 0.7 01/29/2016 0803        PENDING LABS:   RADIOGRAPHIC STUDIES:  No results found.   PATHOLOGY:    ASSESSMENT AND PLAN:  Mantle cell lymphoma ([place]) Mantle Cell lymphoma of the large intestine diagnosed 08/2013, S/P BR x 6 cycles from 09/14/2013- 02/07/2014.  Now on maintenance Rituxan every 90 days beginning on 05/10/2014.  Colonoscopy on 02/28/2014 by Dr. Laural Golden demonstrated NED.  Oncology history is up to date.  He is here today for his 9/10 Rituxan maintenance treatment.  Chart is reviewed in detail and Dr. Cathren Laine notes (neurology) is reviewed in detail.  He is scheduled for an MRI of his brain tomorrow, 01/30/2016.  I have added a TSH to today's lab work at the patient request.  He notes that Dr. Jaynee Eagles wants to check his TSH and therefore, I have added this order to today's labs.  Pre-treatment labs today: CBC diff, CMET  Labs ordered for his next treatment as well: CBC diff, CMET  Return in 2 months for follow-up.    THERAPY PLAN:  Continue with Rituxan maintenance as prescribed.  All questions were answered. The patient knows to call the clinic with any problems, questions or concerns. We can certainly see the patient much sooner if necessary.  Patient and plan discussed with Dr. Ancil Linsey  and she is in agreement with the aforementioned.   This note is electronically signed by: Doy Mince 01/29/2016 2:15 PM

## 2016-01-29 NOTE — Progress Notes (Signed)
Please see chemotherapy appt for more information

## 2016-01-29 NOTE — Patient Instructions (Signed)
[facility] Discharge Instructions for Patients Receiving Chemotherapy   Beginning January 23rd 2017 lab work for the [facility] will be done in the  Main lab at [facility] on 1st floor. If you have a lab appointment with the [facility] please come in thru the  Main Entrance and check in at the main information desk   Today you received the following chemotherapy agents Rituxan B12 injection B12 monthly     Rituximab injection What is this medicine? RITUXIMAB (ri TUX i mab) is a monoclonal antibody. It is used commonly to treat non-Hodgkin lymphoma and other conditions. It is also used to treat rheumatoid arthritis (RA). In RA, this medicine slows the inflammatory process and help reduce joint pain and swelling. This medicine is often used with other cancer or arthritis medications. This medicine may be used for other purposes; ask your health care provider or pharmacist if you have questions. What should I tell my health care provider before I take this medicine? They need to know if you have any of these conditions: -blood disorders -heart disease -history of hepatitis B -infection (especially a virus infection such as chickenpox, cold sores, or herpes) -irregular heartbeat -kidney disease -lung or breathing disease, like asthma -lupus -an unusual or allergic reaction to rituximab, mouse proteins, other medicines, foods, dyes, or preservatives -pregnant or trying to get pregnant -breast-feeding How should I use this medicine? This medicine is for infusion into a vein. It is administered in a hospital or clinic by a specially trained health care professional. A special MedGuide will be given to you by the pharmacist with each prescription and refill. Be sure to read this information carefully each time. Talk to your pediatrician regarding the use of this medicine in children. This medicine is not approved for use in children. Overdosage: If you think you have  taken too much of this medicine contact a poison control center or emergency room at once. NOTE: This medicine is only for you. Do not share this medicine with others. What if I miss a dose? It is important not to miss a dose. Call your doctor or health care professional if you are unable to keep an appointment. What may interact with this medicine? -cisplatin -medicines for blood pressure -some other medicines for arthritis -vaccines This list may not describe all possible interactions. Give your health care provider a list of all the medicines, herbs, non-prescription drugs, or dietary supplements you use. Also tell them if you smoke, drink alcohol, or use illegal drugs. Some items may interact with your medicine. What should I watch for while using this medicine? Report any side effects that you notice during your treatment right away, such as changes in your breathing, fever, chills, dizziness or lightheadedness. These effects are more common with the first dose. Visit your prescriber or health care professional for checks on your progress. You will need to have regular blood work. Report any other side effects. The side effects of this medicine can continue after you finish your treatment. Continue your course of treatment even though you feel ill unless your doctor tells you to stop. Call your doctor or health care professional for advice if you get a fever, chills or sore throat, or other symptoms of a cold or flu. Do not treat yourself. This drug decreases your body's ability to fight infections. Try to avoid being around people who are sick. This medicine may increase your risk to bruise or bleed. Call your doctor or health  care professional if you notice any unusual bleeding. Be careful brushing and flossing your teeth or using a toothpick because you may get an infection or bleed more easily. If you have any dental work done, tell your dentist you are receiving this medicine. Avoid taking  products that contain aspirin, acetaminophen, ibuprofen, naproxen, or ketoprofen unless instructed by your doctor. These medicines may hide a fever. Do not become pregnant while taking this medicine. Women should inform their doctor if they wish to become pregnant or think they might be pregnant. There is a potential for serious side effects to an unborn child. Talk to your health care professional or pharmacist for more information. Do not breast-feed an infant while taking this medicine. What side effects may I notice from receiving this medicine? Side effects that you should report to your doctor or health care professional as soon as possible: -allergic reactions like skin rash, itching or hives, swelling of the face, lips, or tongue -low blood counts - this medicine may decrease the number of white blood cells, red blood cells and platelets. You may be at increased risk for infections and bleeding. -signs of infection - fever or chills, cough, sore throat, pain or difficulty passing urine -signs of decreased platelets or bleeding - bruising, pinpoint red spots on the skin, black, tarry stools, blood in the urine -signs of decreased red blood cells - unusually weak or tired, fainting spells, lightheadedness -breathing problems -confused, not responsive -chest pain -fast, irregular heartbeat -feeling faint or lightheaded, falls -mouth sores -redness, blistering, peeling or loosening of the skin, including inside the mouth -stomach pain -swelling of the ankles, feet, or hands -trouble passing urine or change in the amount of urine Side effects that usually do not require medical attention (report to your doctor or other health care professional if they continue or are bothersome): -anxiety -headache -loss of appetite -muscle aches -nausea -night sweats This list may not describe all possible side effects. Call your doctor for medical advice about side effects. You may report side effects  to FDA at 1-800-FDA-1088. Where should I keep my medicine? This drug is given in a hospital or clinic and will not be stored at home. NOTE: This sheet is a summary. It may not cover all possible information. If you have questions about this medicine, talk to your doctor, pharmacist, or health care provider.    2016, Elsevier/Gold Standard. (2014-11-16 22:30:56)     To help prevent nausea and vomiting after your treatment, we encourage you to take your nausea medication .   If you develop nausea and vomiting, or diarrhea that is not controlled by your medication, call the clinic.  The clinic phone number is [phone]. Office hours are Monday-Friday 8:30am-5:00pm.  BELOW ARE SYMPTOMS THAT SHOULD BE REPORTED IMMEDIATELY:  *FEVER GREATER THAN 101.0 F  *CHILLS WITH OR WITHOUT FEVER  NAUSEA AND VOMITING THAT IS NOT CONTROLLED WITH YOUR NAUSEA MEDICATION  *UNUSUAL SHORTNESS OF BREATH  *UNUSUAL BRUISING OR BLEEDING  TENDERNESS IN MOUTH AND THROAT WITH OR WITHOUT PRESENCE OF ULCERS  *URINARY PROBLEMS  *BOWEL PROBLEMS  UNUSUAL RASH Items with * indicate a potential emergency and should be followed up as soon as possible. If you have an emergency after office hours please contact your primary care physician or go to the nearest emergency department.  Please call the clinic during office hours if you have any questions or concerns.   You may also contact the Patient Navigator at [phone] should you have  any questions or need assistance in obtaining follow up care.      Resources For Cancer Patients and their Caregivers ? American Cancer Society: Can assist with transportation, wigs, general needs, runs Look Good Feel Better.        (973) 755-6692 ? Cancer Care: Provides financial assistance, online support groups, medication/co-pay assistance.  1-800-813-HOPE (438) 478-7088) ? [place] Assists [place] cancer patients and their families  through emotional , educational and financial support.  (224) 234-8322 ? [place] DSS Where to apply for food stamps, Medicaid and utility assistance. 208-347-3003 ? RCATS: Transportation to medical appointments. (226)829-2135 ? Social Security Administration: May apply for disability if have a Stage IV cancer. (615)061-5750 904-773-5093 ? [employer], Disability and Transit Services: Assists with nutrition, care and transit needs. (814)340-2929

## 2016-01-30 ENCOUNTER — Ambulatory Visit (HOSPITAL_COMMUNITY): Payer: Medicare Other

## 2016-01-30 ENCOUNTER — Ambulatory Visit (HOSPITAL_COMMUNITY)
Admission: RE | Admit: 2016-01-30 | Discharge: 2016-01-30 | Disposition: A | Discharge status: Home / Self Care | Payer: Medicare Other | Source: Ambulatory Visit | Attending: Neurology | Admitting: Neurology

## 2016-01-30 DIAGNOSIS — R251 Tremor, unspecified: Secondary | ICD-10-CM | POA: Insufficient documentation

## 2016-01-30 DIAGNOSIS — G311 Senile degeneration of brain, not elsewhere classified: Secondary | ICD-10-CM | POA: Diagnosis not present

## 2016-01-30 DIAGNOSIS — R413 Other amnesia: Secondary | ICD-10-CM | POA: Diagnosis not present

## 2016-01-31 ENCOUNTER — Telehealth: Payer: Self-pay | Admitting: *Deleted

## 2016-01-31 NOTE — Telephone Encounter (Signed)
Spoke with wife and informed her, per Dr Jaynee Eagles, her husband's MRI of the brain is normal for age. She can review the images with them at his next appointment on 05/06/16.  She verbalized understanding, stated she would inform patient. She verbalized appreciation of call.

## 2016-02-24 DIAGNOSIS — J0100 Acute maxillary sinusitis, unspecified: Secondary | ICD-10-CM | POA: Diagnosis not present

## 2016-02-29 ENCOUNTER — Ambulatory Visit (HOSPITAL_COMMUNITY): Payer: Medicare Other

## 2016-03-05 ENCOUNTER — Encounter (HOSPITAL_COMMUNITY): Payer: Self-pay

## 2016-03-05 ENCOUNTER — Encounter (HOSPITAL_COMMUNITY): Payer: Medicare Other | Attending: Hematology and Oncology

## 2016-03-05 VITALS — BP 122/58 | HR 65 | Temp 97.7°F | Resp 18

## 2016-03-05 DIAGNOSIS — E538 Deficiency of other specified B group vitamins: Secondary | ICD-10-CM

## 2016-03-05 DIAGNOSIS — C8310 Mantle cell lymphoma, unspecified site: Secondary | ICD-10-CM | POA: Insufficient documentation

## 2016-03-05 MED ORDER — CYANOCOBALAMIN 1000 MCG/ML IJ SOLN
INTRAMUSCULAR | Status: AC
  Filled 2016-03-05: qty 1

## 2016-03-05 MED ORDER — CYANOCOBALAMIN 1000 MCG/ML IJ SOLN
1000.0000 ug | Freq: Once | INTRAMUSCULAR | Status: AC
Start: 2016-03-05 — End: 2016-03-05
  Administered 2016-03-05: 1000 ug via INTRAMUSCULAR

## 2016-03-05 NOTE — Progress Notes (Signed)
[name]'s reason for visit today is for an injection  as scheduled per MD orders.    [name] also received vitamin b12 per MD orders; see [facility] for administration details.  [name] tolerated all procedures well and without incident; questions were answered and patient was discharged.

## 2016-03-05 NOTE — Patient Instructions (Signed)
[place] at [facility] Discharge Instructions  RECOMMENDATIONS MADE BY THE CONSULTANT AND ANY TEST RESULTS WILL BE SENT TO YOUR REFERRING PHYSICIAN.  b12 injection today b12 monthly Follow up as scheduled Please call the clinic if you have any questions or concerns     Thank you for choosing [place] at [facility] to provide your oncology and hematology care.  To afford each patient quality time with our provider, please arrive at least 15 minutes before your scheduled appointment time.   Beginning January 23rd 2017 lab work for the [facility] will be done in the  Main lab at [facility] on 1st floor. If you have a lab appointment with the [facility] please come in thru the  Main Entrance and check in at the main information desk  You need to re-schedule your appointment should you arrive 10 or more minutes late.  We strive to give you quality time with our providers, and arriving late affects you and other patients whose appointments are after yours.  Also, if you no show three or more times for appointments you may be dismissed from the clinic at the providers discretion.     Again, thank you for choosing [facility].  Our hope is that these requests will decrease the amount of time that you wait before being seen by our physicians.       _____________________________________________________________  Should you have questions after your visit to [facility], please contact our office at [phone] between the hours of 8:30 a.m. and 4:30 p.m.  Voicemails left after 4:30 p.m. will not be returned until the following business day.  For prescription refill requests, have your pharmacy contact our office.         Resources For Cancer Patients and their Caregivers ? American Cancer Society: Can assist with transportation, wigs, general needs, runs Look Good Feel Better.        (619)480-9795 ? Cancer  Care: Provides financial assistance, online support groups, medication/co-pay assistance.  1-800-813-HOPE 7407355408) ? [place] Assists [place] cancer patients and their families through emotional , educational and financial support.  956-659-6507 ? [place] DSS Where to apply for food stamps, Medicaid and utility assistance. 5301553740 ? RCATS: Transportation to medical appointments. (639)643-8252 ? Social Security Administration: May apply for disability if have a Stage IV cancer. 830-844-7622 5034154994 ? [employer], Disability and Transit Services: Assists with nutrition, care and transit needs. [place] Support Programs: '@10RELATIVEDAYS'$ @ > Cancer Support Group  2nd Tuesday of the month 1pm-2pm, Journey Room  > Creative Journey  3rd Tuesday of the month 1130am-1pm, Journey Room  > Look Good Feel Better  1st Wednesday of the month 10am-12 noon, Journey Room (Call [facility] to register [phone])

## 2016-03-14 ENCOUNTER — Ambulatory Visit (INDEPENDENT_AMBULATORY_CARE_PROVIDER_SITE_OTHER): Payer: Medicare Other | Admitting: Otolaryngology

## 2016-03-14 DIAGNOSIS — R05 Cough: Secondary | ICD-10-CM

## 2016-03-14 DIAGNOSIS — H6983 Other specified disorders of Eustachian tube, bilateral: Secondary | ICD-10-CM

## 2016-03-14 DIAGNOSIS — J342 Deviated nasal septum: Secondary | ICD-10-CM

## 2016-03-14 DIAGNOSIS — J310 Chronic rhinitis: Secondary | ICD-10-CM

## 2016-03-15 DIAGNOSIS — B351 Tinea unguium: Secondary | ICD-10-CM | POA: Diagnosis not present

## 2016-03-15 DIAGNOSIS — E1142 Type 2 diabetes mellitus with diabetic polyneuropathy: Secondary | ICD-10-CM | POA: Diagnosis not present

## 2016-04-01 ENCOUNTER — Encounter (HOSPITAL_BASED_OUTPATIENT_CLINIC_OR_DEPARTMENT_OTHER): Payer: Medicare Other

## 2016-04-01 ENCOUNTER — Encounter (HOSPITAL_COMMUNITY): Payer: Medicare Other | Attending: Hematology and Oncology | Admitting: Hematology & Oncology

## 2016-04-01 ENCOUNTER — Encounter (HOSPITAL_COMMUNITY): Payer: Medicare Other

## 2016-04-01 ENCOUNTER — Inpatient Hospital Stay (HOSPITAL_COMMUNITY): Payer: Medicare Other

## 2016-04-01 ENCOUNTER — Encounter (HOSPITAL_COMMUNITY): Payer: Self-pay | Admitting: Hematology & Oncology

## 2016-04-01 VITALS — BP 142/87 | HR 62 | Temp 97.8°F | Resp 18

## 2016-04-01 VITALS — BP 143/75 | HR 62 | Temp 97.7°F | Resp 20

## 2016-04-01 DIAGNOSIS — Z5112 Encounter for antineoplastic immunotherapy: Secondary | ICD-10-CM

## 2016-04-01 DIAGNOSIS — R918 Other nonspecific abnormal finding of lung field: Secondary | ICD-10-CM

## 2016-04-01 DIAGNOSIS — D61818 Other pancytopenia: Secondary | ICD-10-CM

## 2016-04-01 DIAGNOSIS — D696 Thrombocytopenia, unspecified: Secondary | ICD-10-CM | POA: Diagnosis not present

## 2016-04-01 DIAGNOSIS — R5382 Chronic fatigue, unspecified: Secondary | ICD-10-CM | POA: Diagnosis not present

## 2016-04-01 DIAGNOSIS — N183 Chronic kidney disease, stage 3 unspecified: Secondary | ICD-10-CM

## 2016-04-01 DIAGNOSIS — C8310 Mantle cell lymphoma, unspecified site: Secondary | ICD-10-CM | POA: Diagnosis not present

## 2016-04-01 DIAGNOSIS — R251 Tremor, unspecified: Secondary | ICD-10-CM | POA: Diagnosis not present

## 2016-04-01 DIAGNOSIS — E538 Deficiency of other specified B group vitamins: Secondary | ICD-10-CM

## 2016-04-01 DIAGNOSIS — C61 Malignant neoplasm of prostate: Secondary | ICD-10-CM

## 2016-04-01 DIAGNOSIS — R0602 Shortness of breath: Secondary | ICD-10-CM

## 2016-04-01 LAB — CBC WITH DIFFERENTIAL/PLATELET
BASOS ABS: 0.1 10*3/uL (ref 0.0–0.1)
BASOS PCT: 1 %
EOS PCT: 5 %
Eosinophils Absolute: 0.2 10*3/uL (ref 0.0–0.7)
HCT: 35.7 % — ABNORMAL LOW (ref 39.0–52.0)
Hemoglobin: 11.7 g/dL — ABNORMAL LOW (ref 13.0–17.0)
Lymphocytes Relative: 26 %
Lymphs Abs: 1.1 10*3/uL (ref 0.7–4.0)
MCH: 30.2 pg (ref 26.0–34.0)
MCHC: 32.8 g/dL (ref 30.0–36.0)
MCV: 92.2 fL (ref 78.0–100.0)
MONO ABS: 0.3 10*3/uL (ref 0.1–1.0)
Monocytes Relative: 7 %
Neutro Abs: 2.6 10*3/uL (ref 1.7–7.7)
Neutrophils Relative %: 61 %
PLATELETS: 89 10*3/uL — AB (ref 150–400)
RBC: 3.87 MIL/uL — ABNORMAL LOW (ref 4.22–5.81)
RDW: 15.8 % — AB (ref 11.5–15.5)
WBC: 4.3 10*3/uL (ref 4.0–10.5)

## 2016-04-01 LAB — COMPREHENSIVE METABOLIC PANEL
ALBUMIN: 4.1 g/dL (ref 3.5–5.0)
ALT: 20 U/L (ref 17–63)
ANION GAP: 7 (ref 5–15)
AST: 23 U/L (ref 15–41)
Alkaline Phosphatase: 59 U/L (ref 38–126)
BUN: 24 mg/dL — AB (ref 6–20)
CHLORIDE: 104 mmol/L (ref 101–111)
CO2: 28 mmol/L (ref 22–32)
Calcium: 8.7 mg/dL — ABNORMAL LOW (ref 8.9–10.3)
Creatinine, Ser: 1.43 mg/dL — ABNORMAL HIGH (ref 0.61–1.24)
GFR calc Af Amer: 53 mL/min — ABNORMAL LOW (ref >60)
GFR, EST NON AFRICAN AMERICAN: 45 mL/min — AB (ref >60)
Glucose, Bld: 253 mg/dL — ABNORMAL HIGH (ref 65–99)
POTASSIUM: 4.1 mmol/L (ref 3.5–5.1)
Sodium: 139 mmol/L (ref 135–145)
Total Bilirubin: 0.8 mg/dL (ref 0.3–1.2)
Total Protein: 6.5 g/dL (ref 6.5–8.1)

## 2016-04-01 MED ORDER — CYANOCOBALAMIN 1000 MCG/ML IJ SOLN
1000.0000 ug | Freq: Once | INTRAMUSCULAR | Status: AC
  Administered 2016-04-01: 1000 ug via INTRAMUSCULAR
  Filled 2016-04-01: qty 1

## 2016-04-01 MED ORDER — DIPHENHYDRAMINE HCL 25 MG PO CAPS
50.0000 mg | ORAL_CAPSULE | Freq: Once | ORAL | Status: DC
  Filled 2016-04-01: qty 2

## 2016-04-01 MED ORDER — HEPARIN SOD (PORK) LOCK FLUSH 100 UNIT/ML IV SOLN
500.0000 [IU] | Freq: Once | INTRAVENOUS | Status: AC | PRN
  Administered 2016-04-01: 500 [IU]
  Filled 2016-04-01: qty 5

## 2016-04-01 MED ORDER — SODIUM CHLORIDE 0.9 % IV SOLN
Freq: Once | INTRAVENOUS | Status: AC
  Administered 2016-04-01: 10:00:00 via INTRAVENOUS

## 2016-04-01 MED ORDER — ACETAMINOPHEN 325 MG PO TABS
650.0000 mg | ORAL_TABLET | Freq: Once | ORAL | Status: DC
  Filled 2016-04-01: qty 2

## 2016-04-01 MED ORDER — SODIUM CHLORIDE 0.9 % IJ SOLN: 10.0000 mL | INTRAMUSCULAR | Status: DC | PRN

## 2016-04-01 MED ORDER — SODIUM CHLORIDE 0.9 % IV SOLN
500.0000 mg/m2 | Freq: Once | INTRAVENOUS | Status: AC
  Administered 2016-04-01: 1100 mg via INTRAVENOUS
  Filled 2016-04-01: qty 100

## 2016-04-01 NOTE — Patient Instructions (Signed)
[place] at [facility] Discharge Instructions  RECOMMENDATIONS MADE BY THE CONSULTANT AND ANY TEST RESULTS WILL BE SENT TO YOUR REFERRING PHYSICIAN.  You were seen by Dr. Whitney Muse today Return to Center in 2 months to see [place] with labs. No further Rituxan needed.  Please call clinic with concerns.  Thank you for choosing [place] at [facility] to provide your oncology and hematology care.  To afford each patient quality time with our provider, please arrive at least 15 minutes before your scheduled appointment time.   Beginning January 23rd 2017 lab work for the [facility] will be done in the  Main lab at [facility] on 1st floor. If you have a lab appointment with the [place] please come in thru the  Main Entrance and check in at the main information desk  You need to re-schedule your appointment should you arrive 10 or more minutes late.  We strive to give you quality time with our providers, and arriving late affects you and other patients whose appointments are after yours.  Also, if you no show three or more times for appointments you may be dismissed from the clinic at the providers discretion.     Again, thank you for choosing [facility]  Our hope is that these requests will decrease the amount of time that you wait before being seen by our physicians.       _____________________________________________________________  Should you have questions after your visit to [facility], please contact our office at [phone] between the hours of 8:30 a.m. and 4:30 p.m.  Voicemails left after 4:30 p.m. will not be returned until the following business day.  For prescription refill requests, have your pharmacy contact our office.         Resources For Cancer Patients and their Caregivers ? American Cancer Society: Can assist with transportation, wigs, general needs, runs Look Good Feel Better.         570-143-2639 ? Cancer Care: Provides financial assistance, online support groups, medication/co-pay assistance.  1-800-813-HOPE (306) 841-3830) ? [place] Assists [place] cancer patients and their families through emotional , educational and financial support.  (867)877-5453 ? [place] DSS Where to apply for food stamps, Medicaid and utility assistance. (706)347-6333 ? RCATS: Transportation to medical appointments. 8707982309 ? Social Security Administration: May apply for disability if have a Stage IV cancer. ([phone] 785-8905 9787128284 ? [employer], Disability and Transit Services: Assists with nutrition, care and transit needs. [place] Support Programs: '@10RELATIVEDAYS'$ @ > Cancer Support Group  2nd Tuesday of the month 1pm-2pm, Journey Room  > Creative Journey  3rd Tuesday of the month 1130am-1pm, Journey Room  > Look Good Feel Better  1st Wednesday of the month 10am-12 noon, Journey Room (Call [place] to register [phone])

## 2016-04-01 NOTE — Patient Instructions (Signed)
[facility] Discharge Instructions for Patients Receiving Chemotherapy   Beginning January 23rd 2017 lab work for the [facility] will be done in the  Main lab at [facility] on 1st floor. If you have a lab appointment with the [facility] please come in thru the  Main Entrance and check in at the main information desk   Today you received the following chemotherapy agents rituxan and B12 b12 monthly   To help prevent nausea and vomiting after your treatment, we encourage you to take your nausea medication .   If you develop nausea and vomiting, or diarrhea that is not controlled by your medication, call the clinic.  The clinic phone number is [phone]. Office hours are Monday-Friday 8:30am-5:00pm.  BELOW ARE SYMPTOMS THAT SHOULD BE REPORTED IMMEDIATELY:  *FEVER GREATER THAN 101.0 F  *CHILLS WITH OR WITHOUT FEVER  NAUSEA AND VOMITING THAT IS NOT CONTROLLED WITH YOUR NAUSEA MEDICATION  *UNUSUAL SHORTNESS OF BREATH  *UNUSUAL BRUISING OR BLEEDING  TENDERNESS IN MOUTH AND THROAT WITH OR WITHOUT PRESENCE OF ULCERS  *URINARY PROBLEMS  *BOWEL PROBLEMS  UNUSUAL RASH Items with * indicate a potential emergency and should be followed up as soon as possible. If you have an emergency after office hours please contact your primary care physician or go to the nearest emergency department.  Please call the clinic during office hours if you have any questions or concerns.   You may also contact the Patient Navigator at [phone] should you have any questions or need assistance in obtaining follow up care.      Resources For Cancer Patients and their Caregivers ? American Cancer Society: Can assist with transportation, wigs, general needs, runs Look Good Feel Better.        (580)354-7178 ? Cancer Care: Provides financial assistance, online support groups, medication/co-pay assistance.  1-800-813-HOPE 6400837536) ? [place] Assists [place] cancer patients and their families through emotional , educational and financial support.  408-406-4912 ? [place] DSS Where to apply for food stamps, Medicaid and utility assistance. (347) 622-6824 ? RCATS: Transportation to medical appointments. 947-060-3087 ? Social Security Administration: May apply for disability if have a Stage IV cancer. ([phone] 091-5957 940-660-3588 ? [employer], Disability and Transit Services: Assists with nutrition, care and transit needs. [phone]

## 2016-04-01 NOTE — Progress Notes (Signed)
Please see chemotherapy appt for more information

## 2016-04-01 NOTE — Progress Notes (Signed)
Brandon Neighbors, MD [place] [address]   DIAGNOSIS: Mantle Cell lymphoma of the large intestine diagnosed 08/2013 Colonoscopy on 02/28/2014 no evidence of disease   SUMMARY OF ONCOLOGIC HISTORY:   Mantle cell lymphoma ([place])   08/27/2013 Initial Diagnosis Colon, biopsy, random - MANTLE CELL LYMPHOMA   09/06/2013 Imaging CT CAP- No significant lymphadenopathy identified within the chest, abdomen or pelvis.   09/14/2013 - 02/07/2014 Chemotherapy Bendamustine/Rituxan x 6 cycles with Neulasta support   12/17/2013 Procedure Colonoscopy with biopsy by Dr. Laural Golden.   12/17/2013 Pathology Results Colon, biopsy, ascending and transverse - BENIGN APPEARING COLONIC MUCOSA WITH SCATTERED ATYPICAL LYMPHOCYTES.Overall, these findings are suspicious for minimal residual mantle cell lymphoma.   02/28/2014 Procedure Colonoscopy with biopsy by Dr. Laural Golden   02/28/2014 Pathology Results Colon, biopsy, proximal and distal - BENIGN COLONIC MUCOSA. - NO SIGNIFICANT INFLAMMATION OR OTHER ABNORMALITIES IDENTIFIED. - NO EVIDENCE OF LYMPHOMA, ADENOMATOUS CHANGES OR MALIGNANCY.   02/28/2014 Remission No evidence of malignancy on colonosocpy and pathology from biopsy.   05/10/2014 -  Chemotherapy Maintenance Rituxan 500 mg/m cycle 1   05/11/2015 Imaging No definite findings to suggest metastatic disease in chest, abdomen or pelvis, new ground glass atten in lungs B, nonspecific, repeat 6 to 12 mo   06/30/2015 - 07/02/2015 Hospital Admission AKI   08/04/2015 Procedure Colonoscopy with biopsy by Dr. Laural Golden.   08/04/2015 Pathology Results Colon, biopsy, right and left. - BENIGN COLORECTAL MUCOSA. - ASSOCIATED BENIGN LYMPHOID AGGREGATES. - NO EVIDENCE OF SIGNIFICANT INFLAMMATION, DYSPLASIA OR MALIGNANCY.    CURRENT THERAPY: Rituxan   INTERVAL HISTORY: [name] 81 y.o. male returns for follow-up of his mantle cell lymphoma.  He is doing well. He has gained back all of his weight and because of his diabetes  that frustrates him some.  He denies abdominal pain, nausea, fever, cough or chills.  [name] is unaccompanied. He is here for Cycle #10 Rituximab today. He is feeling great about this being his last dose of Rituxan.  When asked how he is doing, he states, "Very well other than the weakness in my arms and legs". He was told the recent brain MRI was good. He was put on Propranolol which has helped, he has been diagnosed with essential tremor.  He tries to stay busy and does what he can. 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  States he is doing fairly well with his breathing, though at times he does become short of breath. When asked about his occasional wheeze, he says "Yeah, I get that quite a bit". He sees Dr. Nevada Crane PCP but has not talked with him about this. Later stating, "It is hard for me to get deep breaths often".   Dr. Laural Golden has not spoken with his about having another scope performed and he does not currently have any follow up appointments with him scheduled.   While getting onto the examination table, the patient admits he has trouble stepping up high due to the weakness in his legs. He notes some tenderness at his port site with palpation up at the neck.  No night sweats, fever, chills, nausea. No change in bowel habits. No abdominal pain.   MEDICAL HISTORY: Past Medical History  Diagnosis Date  . Type 2 diabetes mellitus ([place])   . 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 Mixed hyperlipidemia   . PVD (peripheral vascular disease) ([place])   . Essential hypertension, benign   . Prostate cancer ([place])   . Colon cancer ([place])   . B12 deficiency 02/07/2014  . Obstructive sleep apnea     does not use  . GERD (gastroesophageal reflux disease)     has Mixed hyperlipidemia; HYPERTENSION, BENIGN; Coronary atherosclerosis of native coronary  artery; VASOMOTOR RHINITIS; GERD; OSA (obstructive sleep apnea); H/O ulcerative colitis; Mantle cell lymphoma ([place]); Prostate cancer ([place]); Proctitis, radiation; Abdominal pain, left lower quadrant; Abdominal pain, unspecified site; Lower GI bleed; Diverticulitis; Leukopenia; Diverticulosis of colon with hemorrhage; Thrombocytopenia, unspecified ([place]); Acute blood loss anemia; B12 deficiency; AKI (acute kidney injury) ([place]); Acute kidney injury ([place]); CKD (chronic kidney disease) stage 3, GFR 30-59 ml/min; Essential tremor; and Mild cognitive impairment on his problem list.     is allergic to bee venom; adhesive; ciprofloxacin; codeine; iodine; latex; povidone-iodine; and simvastatin.  [name] does not currently have medications on file.  SURGICAL HISTORY: Past Surgical History  Procedure Laterality Date  . Tonsillectomy    . Back surgery    . Vasectomy    . Knee surgery Right     total knee  . Shoulder surgery    . Prostatectomy    . Colonoscopy with esophagogastroduodenoscopy (egd) N/A 08/27/2013    Procedure: COLONOSCOPY WITH ESOPHAGOGASTRODUODENOSCOPY (EGD);  Surgeon: Rogene Houston, MD;  [facility];  Service: Endoscopy;  Laterality: N/A;  730  . Balloon dilation N/A 08/27/2013    Procedure: BALLOON DILATION;  Surgeon: Rogene Houston, MD;  [facility];  Service: Endoscopy;  Laterality: N/A;  Venia Minks dilation N/A 08/27/2013    Procedure: Venia Minks DILATION;  Surgeon: Rogene Houston, MD;  [facility];  Service: Endoscopy;  Laterality: N/A;  . [place]ory dilation N/A 08/27/2013    Procedure: SAVORY DILATION;  Surgeon: Rogene Houston, MD;  [facility];  Service: Endoscopy;  Laterality: N/A;  . Portacath placement Right 2014  . Colonoscopy N/A 12/17/2013    Procedure: COLONOSCOPY;  Surgeon: Rogene Houston, MD;  [facility];  Service: Endoscopy;  Laterality: N/A;  940  . Colon surgery    . Knee arthroscopy Left   . Removal of port  Right   . Colonoscopy N/A 02/28/2014    Procedure: COLONOSCOPY;  Surgeon: Rogene Houston, MD;  [facility];  Service: Endoscopy;  Laterality: N/A;  730  . Colonoscopy N/A 08/04/2015    Procedure: COLONOSCOPY;  Surgeon: Rogene Houston, MD;  [facility];  Service: Endoscopy;  Laterality: N/A;  200 - moved to 11/11 @ 2:10 - Ann to notify pt    SOCIAL HISTORY: Social History   Social History  . Marital Status: Married    Spouse Name: [name]  . Number of Children: 2  . Years of Education: 8   Occupational History  . Retired     Social History Main Topics  . Smoking status: Never Smoker   . Smokeless tobacco: Never Used  . Alcohol Use: No  . Drug Use: No  . Sexual Activity: Yes    Birth Control/ Protection: None   Other Topics Concern  . Not on file   Social History Narrative   Lives with wife   Caffeine use: none    FAMILY HISTORY: Family History  Problem Relation Age of Onset  . Colon cancer Brother   . Cancer Brother   .  Diabetes Father   . Cancer Brother     Review of Systems  Constitutional: Negative. HENT: Negative.   Eyes: Negative.   Respiratory:  Positive for wheezing and shortness of breath.      Chronic cough. Cardiovascular: Negative.   Gastrointestinal: Negative.   Genitourinary: Negative.   Musculoskeletal: Negative.   Skin: Negative.   Neurological: Positive for tremors. Negative for dizziness, tingling, sensory change, speech change, focal weakness, seizures and loss of consciousness.      Tremor in hands and arms. Endo/Heme/Allergies: Negative.   Psychiatric/Behavioral: Negative.   14 point review of systems was performed and is negative except as detailed under history of present illness and above   PHYSICAL EXAMINATION  ECOG PERFORMANCE STATUS: 1 - Symptomatic but completely ambulatory  Filed Vitals:   04/01/16 0852  BP: 143/75  Pulse: 62  Temp: 97.7 F (36.5 C)  Resp: 20    Physical Exam  Constitutional:  He is oriented to person, place, and time and well-developed, well-nourished, and in no distress.  HENT:  Head: Normocephalic and atraumatic.  Nose: Nose normal.  Mouth/Throat: Oropharynx is clear and moist. No oropharyngeal exudate.  Eyes: Conjunctivae and EOM are normal. Pupils are equal, round, and reactive to light. Right eye exhibits no discharge. Left eye exhibits no discharge. No scleral icterus.  Neck: Normal range of motion. Neck supple. No tracheal deviation present. No thyromegaly present.  Cardiovascular: Normal rate, regular rhythm and normal heart sounds.  Exam reveals no gallop and no friction rub.   No murmur heard. Pulmonary/Chest:  Effort normal and breath sounds normal.  He has no rales.  Abdominal: Soft. Bowel sounds are normal. He exhibits no distension and no mass. There is no tenderness. There is no rebound and no guarding.  Initial wheezing heard which cleared. Musculoskeletal: Normal range of motion. He exhibits no edema.  Lymphadenopathy:       Head (right side): No submental and no submandibular adenopathy present.       Head (left side): No submental and no submandibular adenopathy present.    He has no cervical adenopathy.       Right axillary: No pectoral and no lateral adenopathy present.       Left axillary: No pectoral and no lateral adenopathy present.      Right: No inguinal and no supraclavicular adenopathy present.       Left: No inguinal and no supraclavicular adenopathy present.  Neurological: He is alert and oriented to person, place, and time. He has normal reflexes. No cranial nerve deficit. Gait normal. Coordination normal.  Skin: Skin is warm and dry. No rash noted. Tenderness upon palpation of his port site up at the neck. Psychiatric: Mood, memory, affect and judgment normal.  Nursing note and vitals reviewed.  LABORATORY DATA: I have reviewed the data as listed  CBC    Component Value Date/Time   WBC 4.3 04/01/2016 0828   RBC 3.87*  04/01/2016 0828   RBC 3.55* 08/02/2014 0825   HGB 11.7* 04/01/2016 0828   HCT 35.7* 04/01/2016 0828   PLT 89* 04/01/2016 0828   MCV 92.2 04/01/2016 0828   MCH 30.2 04/01/2016 0828   MCHC 32.8 04/01/2016 0828   RDW 15.8* 04/01/2016 0828   LYMPHSABS 1.1 04/01/2016 0828   MONOABS 0.3 04/01/2016 0828   EOSABS 0.2 04/01/2016 0828   BASOSABS 0.1 04/01/2016 0828   CMP     Component Value Date/Time   NA 139 04/01/2016 0828   K 4.1 04/01/2016 3536  CL 104 04/01/2016 0828   CO2 28 04/01/2016 0828   GLUCOSE 253* 04/01/2016 0828   BUN 24* 04/01/2016 0828   CREATININE 1.43* 04/01/2016 0828   CREATININE 1.17 11/28/2014 0955   CALCIUM 8.7* 04/01/2016 0828   PROT 6.5 04/01/2016 0828   ALBUMIN 4.1 04/01/2016 0828   AST 23 04/01/2016 0828   ALT 20 04/01/2016 0828   ALKPHOS 59 04/01/2016 0828   BILITOT 0.8 04/01/2016 0828   GFRNONAA 45* 04/01/2016 0828   GFRAA 53* 04/01/2016 0828    RADIOLOGY: I have personally reviewed the radiological images as listed and agreed with the findings in the report.  Study Result     CLINICAL DATA: Memory loss. Tremor.  EXAM: MRI HEAD WITHOUT CONTRAST  TECHNIQUE: Multiplanar, multiecho pulse sequences of the brain and surrounding structures were obtained without intravenous contrast.  COMPARISON: CT head 01/09/2012 go  FINDINGS: Cortical atrophy, typical for age. Negative for hydrocephalus.  Negative for acute infarct. Mild periventricular white matter hyperintensity bilaterally consistent with mild chronic microvascular ischemia. Mild chronic microvascular ischemic change in the pons. No cerebellar infarction.  Negative for intracranial hemorrhage.  Negative for mass or edema. No shift of the midline structures.  Bilateral lens replacement. No orbital mass. Mild mucosal edema paranasal sinuses.  Pituitary not enlarged. Negative skullbase.  IMPRESSION: Generalized cortical atrophy and chronic microvascular  ischemic changes, typical for age. No acute abnormality.   Electronically Signed  By: Franchot Gallo M.D.  On: 01/30/2016 13:14     ASSESSMENT and THERAPY PLAN:  Mantle cell lymphoma Chronic fatigue Tremor Pancytopenia, thrombocytopenia CKD, stage 3 SOB  His appetite is good and he denies abdominal pain, or change in appetite. He has no evidence of recurrence.  His pancytopenia is chronic and stable. We will continue with observation.   The patient is here for his last cycle, Cycle #10 Rituximab today.  Last imaging in August was reviewed with the patient, he will need follow-up imaging of the chest. Given ongoing SOB, wheezing I would certainly recommend this. This has been ordered and patient will follow-up post to review.   He will return for follow up in 2 months with labs, CT review and office visit.    All questions were answered. The patient knows to call the clinic with any problems, questions or concerns. We can certainly see the patient much sooner if necessary.  This document serves as a record of services personally performed by Ancil Linsey, MD. It was created on her behalf by Arlyce Harman, a trained medical scribe. The creation of this record is based on the scribe's personal observations and the provider's statements to them. This document has been checked and approved by the attending provider.  I have reviewed the above documentation for accuracy and completeness, and I agree with the above.  This note was electronically signed.  Ancil Linsey, MD

## 2016-04-01 NOTE — Progress Notes (Signed)
[name] presents today for injection per the provider's orders.  Vitamin b12 administration without incident; see MAR for injection details.  Patient tolerated procedure well and without incident.  No questions or complaints noted at this time.    Jarold Motto [place] Tolerated rituxan infusion well today Discharged ambulatory

## 2016-04-08 ENCOUNTER — Encounter (HOSPITAL_COMMUNITY): Payer: Self-pay | Admitting: Hematology & Oncology

## 2016-04-23 DIAGNOSIS — D631 Anemia in chronic kidney disease: Secondary | ICD-10-CM | POA: Diagnosis not present

## 2016-04-23 DIAGNOSIS — E1122 Type 2 diabetes mellitus with diabetic chronic kidney disease: Secondary | ICD-10-CM | POA: Diagnosis not present

## 2016-04-23 DIAGNOSIS — E782 Mixed hyperlipidemia: Secondary | ICD-10-CM | POA: Diagnosis not present

## 2016-04-23 DIAGNOSIS — D519 Vitamin B12 deficiency anemia, unspecified: Secondary | ICD-10-CM | POA: Diagnosis not present

## 2016-04-23 DIAGNOSIS — E291 Testicular hypofunction: Secondary | ICD-10-CM | POA: Diagnosis not present

## 2016-04-29 DIAGNOSIS — E1122 Type 2 diabetes mellitus with diabetic chronic kidney disease: Secondary | ICD-10-CM | POA: Diagnosis not present

## 2016-04-29 DIAGNOSIS — D631 Anemia in chronic kidney disease: Secondary | ICD-10-CM | POA: Diagnosis not present

## 2016-04-29 DIAGNOSIS — F339 Major depressive disorder, recurrent, unspecified: Secondary | ICD-10-CM | POA: Diagnosis not present

## 2016-04-29 DIAGNOSIS — N182 Chronic kidney disease, stage 2 (mild): Secondary | ICD-10-CM | POA: Diagnosis not present

## 2016-05-01 DIAGNOSIS — N481 Balanitis: Secondary | ICD-10-CM | POA: Diagnosis not present

## 2016-05-03 ENCOUNTER — Ambulatory Visit (HOSPITAL_COMMUNITY): Payer: Medicare Other

## 2016-05-06 ENCOUNTER — Ambulatory Visit (INDEPENDENT_AMBULATORY_CARE_PROVIDER_SITE_OTHER): Payer: Medicare Other | Admitting: Neurology

## 2016-05-06 ENCOUNTER — Encounter: Payer: Self-pay | Admitting: Neurology

## 2016-05-06 VITALS — BP 131/77 | HR 67 | Ht 67.0 in | Wt 246.6 lb

## 2016-05-06 DIAGNOSIS — G250 Essential tremor: Secondary | ICD-10-CM

## 2016-05-06 DIAGNOSIS — G3184 Mild cognitive impairment, so stated: Secondary | ICD-10-CM | POA: Diagnosis not present

## 2016-05-06 MED ORDER — DONEPEZIL HCL 10 MG PO TABS: 10.0000 mg | ORAL_TABLET | Freq: Every day | ORAL | 12 refills | Status: DC

## 2016-05-06 MED ORDER — PROPRANOLOL HCL 20 MG PO TABS: 20.0000 mg | ORAL_TABLET | Freq: Three times a day (TID) | ORAL | 12 refills | Status: DC

## 2016-05-06 NOTE — Patient Instructions (Addendum)
Remember to drink plenty of fluid, eat healthy meals and do not skip any meals. Try to eat protein with a every meal and eat a healthy snack such as fruit or nuts in between meals. Try to keep a regular sleep-wake schedule and try to exercise daily, particularly in the form of walking, 20-30 minutes a day, if you can.   As far as your medications are concerned, I would like to suggest: Continue propranolol 80m three times a day for tremor Aricept(donepezil) for memory: start with 1/2 tablet daily for 2 weeks and then increase to a whole tablet as long as you don;t have any side effects.   I would like to see you back in 6 months, sooner if we need to. Please call [facility] any interim questions, concerns, problems, updates or refill requests.    Our phone number is [phone] We also have an after hours call service for urgent matters and there is a physician on-call for urgent questions. For any emergencies you know to call 911 or go to the nearest emergency room  Donepezil tablets What is this medicine? DONEPEZIL (doe NEP e zil) is used to treat mild to moderate dementia caused by Alzheimer's disease. This medicine may be used for other purposes; ask your health care provider or pharmacist if you have questions. What should I tell my health care provider before I take this medicine? They need to know if you have any of these conditions: -asthma or other lung disease -difficulty passing urine -head injury -heart disease -history of irregular heartbeat -liver disease -seizures (convulsions) -stomach or intestinal disease, ulcers or stomach bleeding -an unusual or allergic reaction to donepezil, other medicines, foods, dyes, or preservatives -pregnant or trying to get pregnant -breast-feeding How should I use this medicine? Take this medicine by mouth with a glass of water. Follow the directions on the prescription label. You may take this medicine with or without food. Take this  medicine at regular intervals. This medicine is usually taken before bedtime. Do not take it more often than directed. Continue to take your medicine even if you feel better. Do not stop taking except on your doctor's advice. If you are taking the 23 mg donepezil tablet, swallow it whole; do not cut, crush, or chew it. Talk to your pediatrician regarding the use of this medicine in children. Special care may be needed. Overdosage: If you think you have taken too much of this medicine contact a poison control center or emergency room at once. NOTE: This medicine is only for you. Do not share this medicine with others. What if I miss a dose? If you miss a dose, take it as soon as you can. If it is almost time for your next dose, take only that dose, do not take double or extra doses. What may interact with this medicine? Do not take this medicine with any of the following medications: -certain medicines for fungal infections like itraconazole, fluconazole, posaconazole, and voriconazole -cisapride -dextromethorphan; quinidine -dofetilide -dronedarone -pimozide -quinidine -thioridazine -ziprasidone This medicine may also interact with the following medications: -antihistamines for allergy, cough and cold -atropine -bethanechol -carbamazepine -certain medicines for bladder problems like oxybutynin, tolterodine -certain medicines for Parkinson's disease like benztropine, trihexyphenidyl -certain medicines for stomach problems like dicyclomine, hyoscyamine -certain medicines for travel sickness like scopolamine -dexamethasone -ipratropium -NSAIDs, medicines for pain and inflammation, like ibuprofen or naproxen -other medicines for Alzheimer's disease -other medicines that prolong the QT interval (cause an abnormal heart rhythm) -phenobarbital -phenytoin -rifampin,  rifabutin or rifapentine This list may not describe all possible interactions. Give your health care provider a list of all  the medicines, herbs, non-prescription drugs, or dietary supplements you use. Also tell them if you smoke, drink alcohol, or use illegal drugs. Some items may interact with your medicine. What should I watch for while using this medicine? Visit your doctor or health care professional for regular checks on your progress. Check with your doctor or health care professional if your symptoms do not get better or if they get worse. You may get drowsy or dizzy. Do not drive, use machinery, or do anything that needs mental alertness until you know how this drug affects you. What side effects may I notice from receiving this medicine? Side effects that you should report to your doctor or health care professional as soon as possible: -allergic reactions like skin rash, itching or hives, swelling of the face, lips, or tongue -changes in vision -feeling faint or lightheaded, falls -problems with balance -redness, blistering, peeling or loosening of the skin, including inside the mouth -slow heartbeat, or palpitations -stomach pain -unusual bleeding or bruising, red or purple spots on the skin -vomiting -weight loss Side effects that usually do not require medical attention (report to your doctor or health care professional if they continue or are bothersome): -diarrhea, especially when starting treatment -headache -indigestion or heartburn -loss of appetite -muscle cramps -nausea This list may not describe all possible side effects. Call your doctor for medical advice about side effects. You may report side effects to FDA at 1-800-FDA-1088. Where should I keep my medicine? Keep out of reach of children. Store at room temperature between 15 and 30 degrees C (59 and 86 degrees F). Throw away any unused medicine after the expiration date. NOTE: This sheet is a summary. It may not cover all possible information. If you have questions about this medicine, talk to your doctor, pharmacist, or health care  provider.    2016, Elsevier/Gold Standard. (2014-04-21 07:51:52)

## 2016-05-06 NOTE — Progress Notes (Signed)
[facility]    Provider:  Dr Jaynee Eagles Referring Provider: Celene Squibb, MD Primary Care Physician:  Wende Neighbors, MD  CC: tremor  Interval history 05/06/2016:[name] is a 81 y.o. male here as a referral from Dr. [place] Crane for tremor. PMHx CKD, Type 2 DM, HLD, HTN, insomnia, OSA, depression, polyneuropathy, cerebral ischemia, b12 deficiency, mantle cell lymphoma, anxiety, parkinson's diseas (?). He goes by the name [name].  Had side effects with primidone and neurontin. He has been taking 43m propranolol three times a day. It has helped with the tremor and blood pressure has not been low and no problems taking the medication 3x a day.   MRI of the brain was unremarkable for age: IMPRESSION: Generalized cortical atrophy and chronic microvascular ischemicchanges, typical for age. No acute abnormality.   Interval history 01/22/2016: primidone and neurontin had side effects. Discussed propranolol and that this is heart medication, we need to be very mindful of his pulse and blood pressure and this can cause significant decreases in both, he should take his blood pressure and pulse daily, do not take if BP less than 11/60 or pulse < 60, stop for anything concerning whatsoever including SOB, CP. Discussed his memory, He forgetting conversation, things wife tells you, people's names, no dementia in the family. Been going on slowly progressive for at least 2 years. He takes b12 shots. He used to have a cpap and stopped. He is tired during the day. He snores, his wife doesn't. Last sleep study was 4-5 years ago. He is Morbidly obese. Epworth sleep scale only 5/10, doesn't appear to be candidate for sleep stufy. Discussed neurocognitive testing as well.  HPI: Brandon MATHENYis a 81y.o. male here as a referral from Dr. [place] Cranefor tremor. PMHx CKD, Type 2 DM, HLD, HTN, insomnia, OSA, depression, polyneuropathy, cerebral ischemia, b12 deficiency, mantle cell lymphoma, anxiety,  parkinson's diseas (?). He goes by the name [name] Tremor has been going on for over a year. Worsening. Some days he doesn't have it. Today is a good day. When he has it, it is continuous, both resting and postural. He is weak all over, he has pain all over, he went to physical therapy and improved. He has been taking carbidopa/levodopa and it has not helped the tremor, [place]or started more than year. Getting worse. He has a difficult time writing mostly. No difficulty eating, denies excessive coffee. Nothing makes it better or unknown. Grandmother had a tremor, she had it severely and also her head shook and she shook all over. Sister has a similar tremor. He has trouble turning pages due to the tremor. Slowly progressive. No other focal neurologic deficits.   Review of outside records; HgbA1c 6.5, cmp 08/24/2015 creatinine 1.28 bun 18 gfr 54 Cbc with 3.2 wbc and anemia 11.4/36.7  Review of Systems: Patient complains of symptoms per HPI as well as the following symptoms: weight gain, easy bruising, easy beeding, SOB, cough, swelling in legs, ringing in ears, cramps, aching musckes, allergies, runnynose, weaknes, decrease energy, change in appetite, restless legs. Pertinent negatives per HPI. All others negative.   Social History   Social History  . Marital status: Married    Spouse name: [name] . Number of children: 2  . Years of education: 8   Occupational History  . Retired  Retired   Social History Main Topics  . Smoking status: Never Smoker  . Smokeless tobacco: Never Used  .  Alcohol use No  . Drug use: No  . Sexual activity: Yes    Birth control/ protection: None   Other Topics Concern  . Not on file   Social History Narrative   Lives with wife   Caffeine use: none    Family History  Problem Relation Age of Onset  . Colon cancer Brother   . Cancer Brother   . Diabetes Father   . Cancer Brother     Past Medical History:    Diagnosis Date  . B12 deficiency 02/07/2014  . Colon cancer ([place])   . 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 Essential hypertension, benign   . GERD (gastroesophageal reflux disease)   . Mixed hyperlipidemia   . Obstructive sleep apnea    does not use  . Prostate cancer ([place])   . PVD (peripheral vascular disease) ([place])   . Type 2 diabetes mellitus ([place])     Past Surgical History:  Procedure Laterality Date  . BACK SURGERY    . BALLOON DILATION N/A 08/27/2013   Procedure: BALLOON DILATION;  Surgeon: Rogene Houston, MD;  [facility];  Service: Endoscopy;  Laterality: N/A;  . COLON SURGERY    . COLONOSCOPY N/A 12/17/2013   Procedure: COLONOSCOPY;  Surgeon: Rogene Houston, MD;  [facility];  Service: Endoscopy;  Laterality: N/A;  940  . COLONOSCOPY N/A 02/28/2014   Procedure: COLONOSCOPY;  Surgeon: Rogene Houston, MD;  [facility];  Service: Endoscopy;  Laterality: N/A;  730  . COLONOSCOPY N/A 08/04/2015   Procedure: COLONOSCOPY;  Surgeon: Rogene Houston, MD;  [facility];  Service: Endoscopy;  Laterality: N/A;  200 - moved to 11/11 @ 2:10 - Ann to notify pt  . COLONOSCOPY WITH ESOPHAGOGASTRODUODENOSCOPY (EGD) N/A 08/27/2013   Procedure: COLONOSCOPY WITH ESOPHAGOGASTRODUODENOSCOPY (EGD);  Surgeon: Rogene Houston, MD;  [facility];  Service: Endoscopy;  Laterality: N/A;  730  . KNEE ARTHROSCOPY Left   . KNEE SURGERY Right    total knee  . MALONEY DILATION N/A 08/27/2013   Procedure: Venia Minks DILATION;  Surgeon: Rogene Houston, MD;  [facility];  Service: Endoscopy;  Laterality: N/A;  . PORTACATH PLACEMENT Right 2014  . PROSTATECTOMY    . removal of port Right   . SAVORY DILATION N/A 08/27/2013   Procedure: SAVORY DILATION;  Surgeon: Rogene Houston, MD;  [facility];  Service: Endoscopy;  Laterality: N/A;  .  SHOULDER SURGERY    . TONSILLECTOMY    . VASECTOMY      Current Outpatient Prescriptions  Medication Sig Dispense Refill  . ACCU-CHEK AVIVA PLUS test strip   1  . acetaminophen (TYLENOL) 325 MG tablet Take 650 mg by mouth every 4 (four) hours as needed for mild pain or moderate pain. Take 2 tablets 1 hour prior to Rituxan treatment.    . Cyanocobalamin (VITAMIN B-12 IJ) Inject as directed every 30 (thirty) days.     . fluticasone (FLONASE) 50 MCG/ACT nasal spray Place 1 spray into both nostrils 2 (two) times daily.    [name] gabapentin (NEURONTIN) 300 MG capsule Take 1 capsule (300 mg total) by mouth 3 (three) times daily. 90 capsule 11  . glipiZIDE (GLUCOTROL) 10 MG tablet Take 10 mg by mouth.    . insulin glargine (LANTUS SOLOSTAR) 100 UNIT/ML injection Inject 50 Units into  the skin every morning.     . insulin lispro (HUMALOG) 100 UNIT/ML injection Inject 10 Units into the skin daily as needed for high blood sugar.    . Lancets (ACCU-CHEK MULTICLIX) lancets   1  . lidocaine-prilocaine (EMLA) cream Apply 1 application topically daily as needed (chemo). Apply a quarter size amount to port site 1 hour prior to chemo. Do not rub in. Cover with plastic wrap.    . lisinopril (PRINIVIL,ZESTRIL) 40 MG tablet Take 40 mg by mouth daily.    [name] METRONIDAZOLE, TOPICAL, 0.75 % LOTN Apply 1 application topically as needed.    . pantoprazole (PROTONIX) 40 MG tablet Take 1 tablet (40 mg total) by mouth 2 (two) times daily before a meal. 60 tablet 5  . prednisoLONE acetate (PRED FORTE) 1 % ophthalmic suspension Place 1 drop into both eyes as needed.    . primidone (MYSOLINE) 50 MG tablet Take 0.5 tablets (25 mg total) by mouth at bedtime. 30 tablet 11  . PROAIR HFA 108 (90 Base) MCG/ACT inhaler Inhale 2 puffs into the lungs 3 (three) times daily as needed.    . propranolol (INDERAL) 20 MG tablet Start with 99m twice daily. Increase after 2 wks, check blood pressure daily, do not increase if BP below 110/60 or  pulse below 60 120 tablet 6  . sulfacetamide (BLEPH-10) 10 % ophthalmic solution Place 1 drop into both eyes daily as needed (irritation).      No current facility-administered medications for this visit.    Facility-Administered Medications Ordered in Other Visits  Medication Dose Route Frequency Provider Last Rate Last Dose  . heparin lock flush 100 unit/mL  500 Units Intravenous Once TBaird Cancer PA-C      . sodium chloride flush (NS) 0.9 % injection 20 mL  20 mL Intravenous PRN TBaird Cancer PA-C        Allergies as of 05/06/2016 - Review Complete 05/06/2016  Allergen Reaction Noted  . Bee venom Anaphylaxis   . Adhesive [tape] Hives   . Ciprofloxacin    . Codeine    . Iodine    . Latex    . Povidone-iodine  10/15/2010  . Simvastatin      Vitals: Ht 5' 7"  (1.702 m)   Wt 246 lb 9.6 oz (111.9 kg)   BMI 38.62 kg/m  Last Weight:  Wt Readings from Last 1 Encounters:  05/06/16 246 lb 9.6 oz (111.9 kg)   Last Height:   Ht Readings from Last 1 Encounters:  05/06/16 5' 7"  (1.702 m)    Montreal Cognitive Assessment  01/22/2016  Visuospatial/ Executive (0/5) 4  Naming (0/3) 3  Attention: Read list of digits (0/2) 2  Attention: Read list of letters (0/1) 1  Attention: Serial 7 subtraction starting at 100 (0/3) 2  Language: Repeat phrase (0/2) 2  Language : Fluency (0/1) 0  Abstraction (0/2) 2  Delayed Recall (0/5) 1  Orientation (0/6) 6  Total 23  Adjusted Score (based on education) 24   Cranial Nerves:  The pupils are equal, round, and reactive to light. The fundi are normal and spontaneous venous pulsations are present. Visual fields are full to finger confrontation. Extraocular movements are intact. Trigeminal sensation is intact and the muscles of mastication are normal. The face is symmetric. The palate elevates in the midline. Hearing intact. Voice is normal. Shoulder shrug is normal. The tongue has normal motion without fasciculations.   Coordination:   Normal finger to nose and heel to shin.  Gait:  Mildly wide based  Motor Observation:  No asymmetry, no atrophy, High frequency low amplitude tremor with postural with action components Tone:  Normal muscle tone. No cogwheeling.   Posture:  Posture is normal. normal erect   Strength:  Strength is V/V in the upper and lower limbs.    Sensation: dec pin prick, temp to the knees. Absent vibration at the toes.    Reflex Exam:  DTR's:  Deep tendon reflexes in the upper and lower extremities are hypo bilaterally.  Toes:  The toes are downgoing bilaterally.  Clonus:  Clonus is absent.      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 Grandmother and sister have tremor. He has a low amplitude, high frequency tremor. I don't see any signs of parkinsonism, he does not shuffle, tone is normal, no resting tremor, not bradykinetic. I think this is an essential tremor. He was given sinemet from another doctor which didn't help because I don't think he has PD. Had side effects to primidone and neurontin. Have not tried propranolol to date because of risks in the elderly, hypotension and bradycardia, discussed it at length and he wants to try it.   - Tremor improved on propranolol, no side effects, continue 58m 3x daily  - Memory loss: Labs and MRI of the brain - unremarkable. Start Aricept at next appointment. MoCA 24/30, mild cognitive impairment.He takes vitamin D daily and B12 shots, continue. Epworth sleep scale 5/10, doesn't appear to need a sleep eval for OSA.  - Discussed CREAD clinical trial for mild cognitive impairment.   tsh normal, he is on oral B12 due to b12 deficiency and follows with pcp.  ASarina Ill MD  [facility] [address] [place] [id number] Phone [phone] [phone] A total  of 30 minutes was spent face-to-face with this patient. Over half this time was spent on counseling patient on the essential trmoe and mild cognitive impairment diagnosis and different diagnostic and therapeutic options available.

## 2016-05-07 ENCOUNTER — Encounter (HOSPITAL_COMMUNITY): Payer: Medicare Other | Attending: Hematology and Oncology

## 2016-05-07 VITALS — BP 144/63 | HR 76 | Temp 98.2°F | Resp 18

## 2016-05-07 DIAGNOSIS — C8310 Mantle cell lymphoma, unspecified site: Secondary | ICD-10-CM | POA: Insufficient documentation

## 2016-05-07 DIAGNOSIS — E538 Deficiency of other specified B group vitamins: Secondary | ICD-10-CM

## 2016-05-07 MED ORDER — CYANOCOBALAMIN 1000 MCG/ML IJ SOLN
INTRAMUSCULAR | Status: AC
  Filled 2016-05-07: qty 1

## 2016-05-07 MED ORDER — CYANOCOBALAMIN 1000 MCG/ML IJ SOLN
1000.0000 ug | Freq: Once | INTRAMUSCULAR | Status: AC
  Administered 2016-05-07: 1000 ug via INTRAMUSCULAR

## 2016-05-07 NOTE — Patient Instructions (Signed)
[facility] Discharge Instructions  RECOMMENDATIONS MADE BY THE CONSULTANT AND ANY TEST RESULTS WILL BE SENT TO YOUR REFERRING PHYSICIAN.  Vitamin B12 1000 mcg injection given as ordered. Return as scheduled.  Thank you for choosing [facility] at [facility] to provide your oncology and hematology care.  To afford each patient quality time with our provider, please arrive at least 15 minutes before your scheduled appointment time.   Beginning January 23rd 2017 lab work for the Cancer Center will be done in the  Main lab at [place] on 1st floor. If you have a lab appointment with the Cancer Center please come in thru the  Main Entrance and check in at the main information desk  You need to re-schedule your appointment should you arrive 10 or more minutes late.  We strive to give you quality time with our providers, and arriving late affects you and other patients whose appointments are after yours.  Also, if you no show three or more times for appointments you may be dismissed from the clinic at the providers discretion.     Again, thank you for choosing [facility].  Our hope is that these requests will decrease the amount of time that you wait before being seen by our physicians.       _____________________________________________________________  Should you have questions after your visit to [facility], please contact our office at [phone] between the hours of 8:30 a.m. and 4:30 p.m.  Voicemails left after 4:30 p.m. will not be returned until the following business day.  For prescription refill requests, have your pharmacy contact our office.         Resources For Cancer Patients and their Caregivers ? American Cancer Society: Can assist with transportation, wigs, general needs, runs Look Good Feel Better.        1-888-227-6333 ? Cancer Care: Provides financial assistance, online support  groups, medication/co-pay assistance.  1-800-813-HOPE (4673) ? [facility] Assists [place] cancer patients and their families through emotional , educational and financial support.  336-427-4357 ? [place] DSS Where to apply for food stamps, Medicaid and utility assistance. 336-342-1394 ? [facility]: Transportation to medical appointments. 336-347-2287 ? Social Security Administration: May apply for disability if have a Stage IV cancer. 336-342-7796 1-800-772-1213 ? [place] Aging, Disability and Transit Services: Assists with nutrition, care and transit needs. 336-349-2343  Cancer Center Support Programs: @10RELATIVEDAYS@ > Cancer Support Group  2nd Tuesday of the month 1pm-2pm, Journey Room  > Creative Journey  3rd Tuesday of the month 1130am-1pm, Journey Room  > Look Good Feel Better  1st Wednesday of the month 10am-12 noon, Journey Room (Call American Cancer Society to register 1-800-395-5775)   

## 2016-05-07 NOTE — Progress Notes (Signed)
[name] presents today for injection per MD orders. B12 1000mcg administered IM in left Upper Arm. Administration without incident. Patient tolerated well.  

## 2016-05-13 DIAGNOSIS — E291 Testicular hypofunction: Secondary | ICD-10-CM | POA: Diagnosis not present

## 2016-05-28 ENCOUNTER — Other Ambulatory Visit: Payer: Self-pay

## 2016-05-28 DIAGNOSIS — I10 Essential (primary) hypertension: Secondary | ICD-10-CM | POA: Diagnosis not present

## 2016-05-28 DIAGNOSIS — E1142 Type 2 diabetes mellitus with diabetic polyneuropathy: Secondary | ICD-10-CM | POA: Diagnosis not present

## 2016-05-28 DIAGNOSIS — E782 Mixed hyperlipidemia: Secondary | ICD-10-CM | POA: Diagnosis not present

## 2016-05-28 DIAGNOSIS — C8310 Mantle cell lymphoma, unspecified site: Secondary | ICD-10-CM | POA: Diagnosis not present

## 2016-05-28 DIAGNOSIS — D519 Vitamin B12 deficiency anemia, unspecified: Secondary | ICD-10-CM | POA: Diagnosis not present

## 2016-05-28 DIAGNOSIS — D696 Thrombocytopenia, unspecified: Secondary | ICD-10-CM | POA: Diagnosis not present

## 2016-05-28 DIAGNOSIS — B351 Tinea unguium: Secondary | ICD-10-CM | POA: Diagnosis not present

## 2016-05-28 DIAGNOSIS — E1122 Type 2 diabetes mellitus with diabetic chronic kidney disease: Secondary | ICD-10-CM | POA: Diagnosis not present

## 2016-05-28 DIAGNOSIS — N182 Chronic kidney disease, stage 2 (mild): Secondary | ICD-10-CM | POA: Diagnosis not present

## 2016-05-28 DIAGNOSIS — G20 Parkinson's disease: Secondary | ICD-10-CM | POA: Diagnosis not present

## 2016-05-28 DIAGNOSIS — F5101 Primary insomnia: Secondary | ICD-10-CM | POA: Diagnosis not present

## 2016-05-28 DIAGNOSIS — K210 Gastro-esophageal reflux disease with esophagitis: Secondary | ICD-10-CM | POA: Diagnosis not present

## 2016-05-28 DIAGNOSIS — G4733 Obstructive sleep apnea (adult) (pediatric): Secondary | ICD-10-CM | POA: Diagnosis not present

## 2016-05-28 DIAGNOSIS — G252 Other specified forms of tremor: Secondary | ICD-10-CM | POA: Diagnosis not present

## 2016-06-03 ENCOUNTER — Encounter (HOSPITAL_BASED_OUTPATIENT_CLINIC_OR_DEPARTMENT_OTHER): Payer: Medicare Other

## 2016-06-03 ENCOUNTER — Encounter (HOSPITAL_COMMUNITY): Payer: Self-pay | Admitting: Oncology

## 2016-06-03 ENCOUNTER — Other Ambulatory Visit (HOSPITAL_COMMUNITY): Payer: Medicare Other

## 2016-06-03 ENCOUNTER — Encounter (HOSPITAL_COMMUNITY): Payer: Medicare Other | Attending: Hematology and Oncology | Admitting: Oncology

## 2016-06-03 DIAGNOSIS — C8319 Mantle cell lymphoma, extranodal and solid organ sites: Secondary | ICD-10-CM

## 2016-06-03 DIAGNOSIS — E538 Deficiency of other specified B group vitamins: Secondary | ICD-10-CM

## 2016-06-03 DIAGNOSIS — C8310 Mantle cell lymphoma, unspecified site: Secondary | ICD-10-CM | POA: Insufficient documentation

## 2016-06-03 LAB — COMPREHENSIVE METABOLIC PANEL
ALBUMIN: 4 g/dL (ref 3.5–5.0)
ALT: 18 U/L (ref 17–63)
ANION GAP: 7 (ref 5–15)
AST: 22 U/L (ref 15–41)
Alkaline Phosphatase: 57 U/L (ref 38–126)
BUN: 16 mg/dL (ref 6–20)
CO2: 28 mmol/L (ref 22–32)
Calcium: 8.6 mg/dL — ABNORMAL LOW (ref 8.9–10.3)
Chloride: 102 mmol/L (ref 101–111)
Creatinine, Ser: 1.21 mg/dL (ref 0.61–1.24)
GFR calc non Af Amer: 56 mL/min — ABNORMAL LOW (ref >60)
GLUCOSE: 188 mg/dL — AB (ref 65–99)
POTASSIUM: 3.9 mmol/L (ref 3.5–5.1)
SODIUM: 137 mmol/L (ref 135–145)
Total Bilirubin: 0.6 mg/dL (ref 0.3–1.2)
Total Protein: 6.5 g/dL (ref 6.5–8.1)

## 2016-06-03 LAB — CBC WITH DIFFERENTIAL/PLATELET
BASOS PCT: 1 %
Basophils Absolute: 0.1 10*3/uL (ref 0.0–0.1)
EOS ABS: 0.1 10*3/uL (ref 0.0–0.7)
EOS PCT: 2 %
HCT: 38.6 % — ABNORMAL LOW (ref 39.0–52.0)
Hemoglobin: 12.6 g/dL — ABNORMAL LOW (ref 13.0–17.0)
LYMPHS ABS: 1.2 10*3/uL (ref 0.7–4.0)
Lymphocytes Relative: 19 %
MCH: 31.4 pg (ref 26.0–34.0)
MCHC: 32.6 g/dL (ref 30.0–36.0)
MCV: 96.3 fL (ref 78.0–100.0)
MONO ABS: 0.4 10*3/uL (ref 0.1–1.0)
MONOS PCT: 7 %
NEUTROS PCT: 71 %
Neutro Abs: 4.3 10*3/uL (ref 1.7–7.7)
PLATELETS: 116 10*3/uL — AB (ref 150–400)
RBC: 4.01 MIL/uL — ABNORMAL LOW (ref 4.22–5.81)
RDW: 17.4 % — AB (ref 11.5–15.5)
WBC: 6 10*3/uL (ref 4.0–10.5)

## 2016-06-03 LAB — SEDIMENTATION RATE: SED RATE: 10 mm/h (ref 0–16)

## 2016-06-03 LAB — LACTATE DEHYDROGENASE: LDH: 159 U/L (ref 98–192)

## 2016-06-03 MED ORDER — SODIUM CHLORIDE 0.9% FLUSH
10.0000 mL | INTRAVENOUS | Status: DC | PRN
  Administered 2016-06-03: 10 mL via INTRAVENOUS
  Filled 2016-06-03: qty 10

## 2016-06-03 MED ORDER — HEPARIN SOD (PORK) LOCK FLUSH 100 UNIT/ML IV SOLN
500.0000 [IU] | Freq: Once | INTRAVENOUS | Status: AC
  Administered 2016-06-03: 500 [IU] via INTRAVENOUS

## 2016-06-03 MED ORDER — HEPARIN SOD (PORK) LOCK FLUSH 100 UNIT/ML IV SOLN
INTRAVENOUS | Status: AC
  Filled 2016-06-03: qty 5

## 2016-06-03 MED ORDER — CYANOCOBALAMIN 1000 MCG/ML IJ SOLN
1000.0000 ug | Freq: Once | INTRAMUSCULAR | Status: AC
  Administered 2016-06-03: 1000 ug via INTRAMUSCULAR

## 2016-06-03 MED ORDER — CYANOCOBALAMIN 1000 MCG/ML IJ SOLN
INTRAMUSCULAR | Status: AC
  Filled 2016-06-03: qty 1

## 2016-06-03 NOTE — Assessment & Plan Note (Addendum)
Mantle Cell lymphoma of the large intestine diagnosed 08/2013, S/P BR x 6 cycles from 09/14/2013- 02/07/2014, followed by maintenance Rituxan 05/10/2014- 04/01/2016. Colonoscopy on 02/28/2014 by Dr. Laural Golden demonstrated NED.   Oncology history updated.  Labs today: CBC diff, CMET, LDH, TSH.  I personally reviewed and went over laboratory results with the patient.  The results are noted within this dictation.  I personally reviewed and went over radiographic studies with the patient.  The results are noted within this dictation.  CT CAP demonstrates no evidence of disease, but there is some new ground glass bilaterally in lungs.  CT chest high-resolution was recommended.  Order is placed for this and will be scheduled.  Labs in 3 months: CBC diff, CMET, LDH, ESR, CRP.  He will need port flushes every 6 weeks.  Return in 3 months for follow-up.

## 2016-06-03 NOTE — Patient Instructions (Signed)
[place] at [facility] Discharge Instructions  RECOMMENDATIONS MADE BY THE CONSULTANT AND ANY TEST RESULTS WILL BE SENT TO YOUR REFERRING PHYSICIAN.  You were seen by Gershon Mussel today. CT chest (high resolution) within the next few weeks. Return for follow up with labs in 3 months Labs and B12 injection today Please contact the center with any related concerns  Thank you for choosing [place] at [facility] to provide your oncology and hematology care.  To afford each patient quality time with our provider, please arrive at least 15 minutes before your scheduled appointment time.   Beginning January 23rd 2017 lab work for the [facility] will be done in the  Main lab at [facility] on 1st floor. If you have a lab appointment with the [facility] please come in thru the  Main Entrance and check in at the main information desk  You need to re-schedule your appointment should you arrive 10 or more minutes late.  We strive to give you quality time with our providers, and arriving late affects you and other patients whose appointments are after yours.  Also, if you no show three or more times for appointments you may be dismissed from the clinic at the providers discretion.     Again, thank you for choosing [facility].  Our hope is that these requests will decrease the amount of time that you wait before being seen by our physicians.       _____________________________________________________________  Should you have questions after your visit to [facility], please contact our office at [phone] between the hours of 8:30 a.m. and 4:30 p.m.  Voicemails left after 4:30 p.m. will not be returned until the following business day.  For prescription refill requests, have your pharmacy contact our office.         Resources For Cancer Patients and their Caregivers ? American Cancer Society: Can assist with  transportation, wigs, general needs, runs Look Good Feel Better.        (640)673-5554 ? Cancer Care: Provides financial assistance, online support groups, medication/co-pay assistance.  1-800-813-HOPE 810-679-7723) ? [place] Assists [place] cancer patients and their families through emotional , educational and financial support.  910-042-3917 ? [place] DSS Where to apply for food stamps, Medicaid and utility assistance. 913-196-7174 ? RCATS: Transportation to medical appointments. (857)210-3777 ? Social Security Administration: May apply for disability if have a Stage IV cancer. 8047522151 979-795-8728 ? [employer], Disability and Transit Services: Assists with nutrition, care and transit needs. [place] Support Programs: '@10RELATIVEDAYS'$ @ > Cancer Support Group  2nd Tuesday of the month 1pm-2pm, Journey Room  > Creative Journey  3rd Tuesday of the month 1130am-1pm, Journey Room  > Look Good Feel Better  1st Wednesday of the month 10am-12 noon, Journey Room (Call [place] to register [phone])

## 2016-06-03 NOTE — Assessment & Plan Note (Signed)
B12 deficiency, on B12 IM replacement therapy.  B12 injection today.

## 2016-06-03 NOTE — Patient Instructions (Signed)
[place] at [facility] Discharge Instructions  RECOMMENDATIONS MADE BY THE CONSULTANT AND ANY TEST RESULTS WILL BE SENT TO YOUR REFERRING PHYSICIAN.  You were given B12 injection.  Port was flushed and labs were drawn and sent to the lab. Return as scheduled.    Thank you for choosing [place] at [facility] to provide your oncology and hematology care.  To afford each patient quality time with our provider, please arrive at least 15 minutes before your scheduled appointment time.   Beginning January 23rd 2017 lab work for the [facility] will be done in the  Main lab at [facility] on 1st floor. If you have a lab appointment with the [place] [facility] please come in thru the  Main Entrance and check in at the main information desk  You need to re-schedule your appointment should you arrive 10 or more minutes late.  We strive to give you quality time with our providers, and arriving late affects you and other patients whose appointments are after yours.  Also, if you no show three or more times for appointments you may be dismissed from the clinic at the providers discretion.     Again, thank you for choosing [facility].  Our hope is that these requests will decrease the amount of time that you wait before being seen by our physicians.       _____________________________________________________________  Should you have questions after your visit to [facility], please contact our office at [phone] between the hours of 8:30 a.m. and 4:30 p.m.  Voicemails left after 4:30 p.m. will not be returned until the following business day.  For prescription refill requests, have your pharmacy contact our office.         Resources For Cancer Patients and their Caregivers ? American Cancer Society: Can assist with transportation, wigs, general needs, runs Look Good Feel Better.        440-025-1791 ? Cancer  Care: Provides financial assistance, online support groups, medication/co-pay assistance.  1-800-813-HOPE (587)500-9767) ? [place] Assists [place] cancer patients and their families through emotional , educational and financial support.  972-038-8685 ? [place] DSS Where to apply for food stamps, Medicaid and utility assistance. (380) 855-4247 ? RCATS: Transportation to medical appointments. 5593078598 ? Social Security Administration: May apply for disability if have a Stage IV cancer. (772)349-2903 (803)754-0357 ? [employer], Disability and Transit Services: Assists with nutrition, care and transit needs. [place] Support Programs: '@10RELATIVEDAYS'$ @ > Cancer Support Group  2nd Tuesday of the month 1pm-2pm, Journey Room  > Creative Journey  3rd Tuesday of the month 1130am-1pm, Journey Room  > Look Good Feel Better  1st Wednesday of the month 10am-12 noon, Journey Room (Call [place] to register [phone])

## 2016-06-03 NOTE — Progress Notes (Signed)
Brandon Neighbors, MD [place] [address]  Mantle cell lymphoma of solid organ excluding spleen [facility]) - Plan: CBC with Differential, Comprehensive metabolic panel, Lactate dehydrogenase, Sedimentation rate, C-reactive protein  CURRENT THERAPY: Surveillance per NCCN guidelines  INTERVAL HISTORY: [name] Brandon 81 y.o. male returns for followup of Mantle Cell lymphoma of the large intestine diagnosed 08/2013, S/P BR x 6 cycles from 09/14/2013- 02/07/2014, followed by maintenance Rituxan 05/10/2014- 04/01/2016. Colonoscopy on 02/28/2014 by Dr. Laural Golden demonstrated NED.     Mantle cell lymphoma ([place])   08/27/2013 Initial Diagnosis    Colon, biopsy, random - MANTLE CELL LYMPHOMA      09/06/2013 Imaging    CT CAP- No significant lymphadenopathy identified within the chest, abdomen or pelvis.      09/14/2013 - 02/07/2014 Chemotherapy    Bendamustine/Rituxan x 6 cycles with Neulasta support      12/17/2013 Procedure    Colonoscopy with biopsy by Dr. Laural Golden.      12/17/2013 Pathology Results    Colon, biopsy, ascending and transverse - BENIGN APPEARING COLONIC MUCOSA WITH SCATTERED ATYPICAL LYMPHOCYTES.Overall, these findings are suspicious for minimal residual mantle cell lymphoma.      02/28/2014 Procedure    Colonoscopy with biopsy by Dr. Laural Golden      02/28/2014 Pathology Results    Colon, biopsy, proximal and distal - BENIGN COLONIC MUCOSA. - NO SIGNIFICANT INFLAMMATION OR OTHER ABNORMALITIES IDENTIFIED. - NO EVIDENCE OF LYMPHOMA, ADENOMATOUS CHANGES OR MALIGNANCY.      02/28/2014 Remission    No evidence of malignancy on colonosocpy and pathology from biopsy.      05/10/2014 - 04/01/2016 Chemotherapy    Maintenance Rituxan 500 mg/m x 2 years.      05/11/2015 Imaging    No definite findings to suggest metastatic disease in chest, abdomen or pelvis, new ground glass atten in lungs B, nonspecific, repeat 6 to 12 mo      06/30/2015 - 07/02/2015 Hospital Admission      [facility]      08/04/2015 Procedure    Colonoscopy with biopsy by Dr. Laural Golden.      08/04/2015 Pathology Results    Colon, biopsy, right and left. - BENIGN COLORECTAL MUCOSA. - ASSOCIATED BENIGN LYMPHOID AGGREGATES. - NO EVIDENCE OF SIGNIFICANT INFLAMMATION, DYSPLASIA OR MALIGNANCY.      05/10/2016 Imaging    CT CAP- 1. No definite findings to suggest metastatic disease in the chest, abdomen or pelvis. There are several new patchy areas of ground-glass attenuation in the lungs bilaterally which are highly nonspecific. The possibility of developing interstitial lung disease is not excluded, and repeat high-resolution chest CT is suggested in 6-12 months to assess for temporal changes in the appearance of the lung parenchyma.        He is doing well.  He does note fatigue, but this is at baseline.  He is followed by Dr. Jaynee Eagles for essential tremor and he reports improvement.  MRI of brain is negative for any acute findings.  He denies any breathing complaints that are new.  He notes intermittent and infrequent SOB at night.  Otherwise, he denies any breathing complaints.  He denies any B symptoms.  His weight is stable.  Review of Systems  Constitutional: Positive for malaise/fatigue. Negative for chills, fever and weight loss.  HENT: Negative.   Eyes: Negative.   Respiratory: Positive for shortness of breath. Negative for cough, hemoptysis and sputum production.   Cardiovascular: Negative for chest pain,  orthopnea and leg swelling.  Gastrointestinal: Negative.   Genitourinary: Negative.   Musculoskeletal: Negative.   Skin: Negative.   Neurological: Negative.   Endo/Heme/Allergies: Negative.   Psychiatric/Behavioral: Negative.     Past Medical History:  Diagnosis Date  . B12 deficiency 02/07/2014  . B12 deficiency 02/07/2014  . Colon cancer ([place])   . 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 Essential hypertension, benign   . GERD (gastroesophageal reflux disease)   . Mixed hyperlipidemia   . Obstructive sleep apnea    does not use  . Prostate cancer ([place])   . PVD (peripheral vascular disease) ([place])   . Type 2 diabetes mellitus ([place])     Past Surgical History:  Procedure Laterality Date  . BACK SURGERY    . BALLOON DILATION N/A 08/27/2013   Procedure: BALLOON DILATION;  Surgeon: Rogene Houston, MD;  [facility];  Service: Endoscopy;  Laterality: N/A;  . COLON SURGERY    . COLONOSCOPY N/A 12/17/2013   Procedure: COLONOSCOPY;  Surgeon: Rogene Houston, MD;  Location[facility];  Service: Endoscopy;  Laterality: N/A;  940  . COLONOSCOPY N/A 02/28/2014   Procedure: COLONOSCOPY;  Surgeon: Rogene Houston, MD;  [facility]: [facility];  Service: Endoscopy;  Laterality: N/A;  730  . COLONOSCOPY N/A 08/04/2015   Procedure: COLONOSCOPY;  Surgeon: Rogene Houston, MD;  [facility];  Service: Endoscopy;  Laterality: N/A;  200 - moved to 11/11 @ 2:10 - Ann to notify pt  . COLONOSCOPY WITH ESOPHAGOGASTRODUODENOSCOPY (EGD) N/A 08/27/2013   Procedure: COLONOSCOPY WITH ESOPHAGOGASTRODUODENOSCOPY (EGD);  Surgeon: Rogene Houston, MD;  [facility];  Service: Endoscopy;  Laterality: N/A;  730  . KNEE ARTHROSCOPY Left   . KNEE SURGERY Right    total knee  . MALONEY DILATION N/A 08/27/2013   Procedure: Venia Minks DILATION;  Surgeon: Rogene Houston, MD;  [facility];  Service: Endoscopy;  Laterality: N/A;  . PORTACATH PLACEMENT Right 2014  . PROSTATECTOMY    . removal of port Right   . SAVORY DILATION N/A 08/27/2013   Procedure: SAVORY DILATION;  Surgeon: Rogene Houston, MD;  [facility];  Service: Endoscopy;  Laterality: N/A;  . SHOULDER SURGERY    . TONSILLECTOMY    . VASECTOMY      Family History  Problem Relation Age of Onset  . Colon cancer Brother   . Cancer Brother   . Diabetes Father   . Cancer Brother      Social History   Social History  . Marital status: Married    Spouse name: [name]  . Number of children: 2  . Years of education: 8   Occupational History  . Retired  Retired   Social History Main Topics  . Smoking status: Never Smoker  . Smokeless tobacco: Never Used  . Alcohol use No  . Drug use: No  . Sexual activity: Yes    Birth control/ protection: None   Other Topics Concern  . None   Social History Narrative   Lives with wife   Caffeine use: none     PHYSICAL EXAMINATION  ECOG PERFORMANCE STATUS: 1 - Symptomatic but completely ambulatory  Vitals:   06/03/16 1400  BP: (!) 145/86  Pulse: 69  Resp: 18  Temp: 97.4 F (36.3 C)    GENERAL:alert, no distress, well nourished,  well developed, comfortable, cooperative, obese, smiling and unaccompanied SKIN: skin color, texture, turgor are normal, no rashes or significant lesions HEAD: Normocephalic, No masses, lesions, tenderness or abnormalities EYES: normal, EOMI, Conjunctiva are pink and non-injected EARS: External ears normal OROPHARYNX:lips, buccal mucosa, and tongue normal and mucous membranes are moist  NECK: supple, no adenopathy, trachea midline LYMPH:  no palpable lymphadenopathy, no hepatosplenomegaly BREAST:not examined LUNGS: clear to auscultation and percussion HEART: regular rate & rhythm, no murmurs, no gallops, S1 normal and S2 normal ABDOMEN:abdomen soft, non-tender, obese, normal bowel sounds and no masses or organomegaly BACK: Back symmetric, no curvature. EXTREMITIES:less then 2 second capillary refill, no joint deformities, effusion, or inflammation, no skin discoloration, no clubbing, no cyanosis  NEURO: alert & oriented x 3 with fluent speech, no focal motor/sensory deficits, gait normal   LABORATORY DATA: CBC    Component Value Date/Time   WBC 4.3 04/01/2016 0828   RBC 3.87 (L) 04/01/2016 0828   HGB 11.7 (L) 04/01/2016 0828   HCT 35.7 (L) 04/01/2016 0828   PLT 89 (L)  04/01/2016 0828   MCV 92.2 04/01/2016 0828   MCH 30.2 04/01/2016 0828   MCHC 32.8 04/01/2016 0828   RDW 15.8 (H) 04/01/2016 0828   LYMPHSABS 1.1 04/01/2016 0828   MONOABS 0.3 04/01/2016 0828   EOSABS 0.2 04/01/2016 0828   BASOSABS 0.1 04/01/2016 0828      Chemistry      Component Value Date/Time   NA 139 04/01/2016 0828   K 4.1 04/01/2016 0828   CL 104 04/01/2016 0828   CO2 28 04/01/2016 0828   BUN 24 (H) 04/01/2016 0828   CREATININE 1.43 (H) 04/01/2016 0828   CREATININE 1.17 11/28/2014 0955      Component Value Date/Time   CALCIUM 8.7 (L) 04/01/2016 0828   ALKPHOS 59 04/01/2016 0828   AST 23 04/01/2016 0828   ALT 20 04/01/2016 0828   BILITOT 0.8 04/01/2016 0828        PENDING LABS:   RADIOGRAPHIC STUDIES:  No results found.   PATHOLOGY:    ASSESSMENT AND PLAN:  Mantle cell lymphoma ([place]) Mantle Cell lymphoma of the large intestine diagnosed 08/2013, S/P BR x 6 cycles from 09/14/2013- 02/07/2014, followed by maintenance Rituxan 05/10/2014- 04/01/2016. Colonoscopy on 02/28/2014 by Dr. Laural Golden demonstrated NED.   Oncology history updated.  Labs today: CBC diff, CMET, LDH, TSH.  I personally reviewed and went over laboratory results with the patient.  The results are noted within this dictation.  I personally reviewed and went over radiographic studies with the patient.  The results are noted within this dictation.  CT CAP demonstrates no evidence of disease, but there is some new ground glass bilaterally in lungs.  CT chest high-resolution was recommended.  Order is placed for this and will be scheduled.  Labs in 3 months: CBC diff, CMET, LDH, ESR, CRP.  He will need port flushes every 6 weeks.  Return in 3 months for follow-up.  B12 deficiency B12 deficiency, on B12 IM replacement therapy.  B12 injection today.   ORDERS PLACED FOR THIS ENCOUNTER: Orders Placed This Encounter  Procedures  . CBC with Differential  . Comprehensive metabolic panel  .  Lactate dehydrogenase  . Sedimentation rate  . C-reactive protein    MEDICATIONS PRESCRIBED THIS ENCOUNTER: Meds ordered this encounter  Medications  . TOUJEO SOLOSTAR 300 UNIT/ML SOPN  . HUMALOG KWIKPEN 100 UNIT/ML KiwkPen  . ipratropium (ATROVENT) 0.06 % nasal spray  . testosterone cypionate (DEPOTESTOSTERONE CYPIONATE) 200 MG/ML  injection    THERAPY PLAN:  Surveillance for Mantle Cell Lymphoma following complete response to therapy per NCCN guidelines (4.2017):  A. Clinical follow-up every 3-6 months for 5 years and then annually, or as clinically indicated.  All questions were answered. The patient knows to call the clinic with any problems, questions or concerns. We can certainly see the patient much sooner if necessary.  Patient and plan discussed with Dr. Ancil Linsey and she is in agreement with the aforementioned.   This note is electronically signed by: Doy Mince 06/03/2016 2:35 PM

## 2016-06-03 NOTE — Progress Notes (Signed)
[name] presented for Portacath access and flush.  Portacath located right chest wall accessed with  H 20 needle.  Good blood return present. Portacath flushed with 14m NS and 500U/587mHeparin and needle removed intact.  Procedure tolerated well and without incident.  Labs were drawn and sent to lab.   Pt also given B12 injection. Pt given injection left deltoid. Pt tolerated well. Pt stable and discharged home ambulatory. Return as scheduled.

## 2016-06-04 LAB — C-REACTIVE PROTEIN: CRP: 1 mg/dL — ABNORMAL HIGH (ref <1.0)

## 2016-06-05 ENCOUNTER — Telehealth (HOSPITAL_COMMUNITY): Payer: Self-pay | Admitting: *Deleted

## 2016-06-05 NOTE — Telephone Encounter (Signed)
-----   Message from [facility], PA-C sent at 06/04/2016  5:19 PM EDT ----- Stable

## 2016-06-05 NOTE — Telephone Encounter (Signed)
Are that labs were stable.

## 2016-06-06 DIAGNOSIS — M654 Radial styloid tenosynovitis [de Quervain]: Secondary | ICD-10-CM | POA: Diagnosis not present

## 2016-06-06 DIAGNOSIS — M720 Palmar fascial fibromatosis [Dupuytren]: Secondary | ICD-10-CM | POA: Diagnosis not present

## 2016-06-10 DIAGNOSIS — E291 Testicular hypofunction: Secondary | ICD-10-CM | POA: Diagnosis not present

## 2016-06-14 DIAGNOSIS — N451 Epididymitis: Secondary | ICD-10-CM | POA: Diagnosis not present

## 2016-06-14 DIAGNOSIS — N281 Cyst of kidney, acquired: Secondary | ICD-10-CM | POA: Diagnosis not present

## 2016-06-14 DIAGNOSIS — N529 Male erectile dysfunction, unspecified: Secondary | ICD-10-CM | POA: Diagnosis not present

## 2016-06-14 DIAGNOSIS — Z8546 Personal history of malignant neoplasm of prostate: Secondary | ICD-10-CM | POA: Diagnosis not present

## 2016-06-14 DIAGNOSIS — N393 Stress incontinence (female) (male): Secondary | ICD-10-CM | POA: Diagnosis not present

## 2016-06-14 DIAGNOSIS — N500 Atrophy of testis: Secondary | ICD-10-CM | POA: Diagnosis not present

## 2016-06-14 DIAGNOSIS — N359 Urethral stricture, unspecified: Secondary | ICD-10-CM | POA: Diagnosis not present

## 2016-06-14 DIAGNOSIS — N183 Chronic kidney disease, stage 3 (moderate): Secondary | ICD-10-CM | POA: Diagnosis not present

## 2016-06-14 DIAGNOSIS — E291 Testicular hypofunction: Secondary | ICD-10-CM | POA: Diagnosis not present

## 2016-06-14 DIAGNOSIS — E538 Deficiency of other specified B group vitamins: Secondary | ICD-10-CM | POA: Diagnosis not present

## 2016-06-17 ENCOUNTER — Ambulatory Visit (HOSPITAL_COMMUNITY)
Admission: RE | Admit: 2016-06-17 | Discharge: 2016-06-17 | Disposition: A | Discharge status: Home / Self Care | Payer: Medicare Other | Source: Ambulatory Visit | Attending: Hematology & Oncology | Admitting: Hematology & Oncology

## 2016-06-17 DIAGNOSIS — R0602 Shortness of breath: Secondary | ICD-10-CM

## 2016-06-17 DIAGNOSIS — I2510 Atherosclerotic heart disease of native coronary artery without angina pectoris: Secondary | ICD-10-CM | POA: Diagnosis not present

## 2016-06-17 DIAGNOSIS — I700 Atherosclerosis of aorta: Secondary | ICD-10-CM | POA: Insufficient documentation

## 2016-06-17 DIAGNOSIS — R918 Other nonspecific abnormal finding of lung field: Secondary | ICD-10-CM | POA: Diagnosis not present

## 2016-06-18 DIAGNOSIS — E291 Testicular hypofunction: Secondary | ICD-10-CM | POA: Diagnosis not present

## 2016-06-18 DIAGNOSIS — R062 Wheezing: Secondary | ICD-10-CM | POA: Diagnosis not present

## 2016-06-18 DIAGNOSIS — Z8546 Personal history of malignant neoplasm of prostate: Secondary | ICD-10-CM | POA: Diagnosis not present

## 2016-06-18 DIAGNOSIS — R0602 Shortness of breath: Secondary | ICD-10-CM | POA: Diagnosis not present

## 2016-06-18 DIAGNOSIS — N182 Chronic kidney disease, stage 2 (mild): Secondary | ICD-10-CM | POA: Diagnosis not present

## 2016-06-18 DIAGNOSIS — E1122 Type 2 diabetes mellitus with diabetic chronic kidney disease: Secondary | ICD-10-CM | POA: Diagnosis not present

## 2016-06-21 ENCOUNTER — Encounter: Payer: Self-pay | Admitting: Nurse Practitioner

## 2016-06-21 DIAGNOSIS — E291 Testicular hypofunction: Secondary | ICD-10-CM | POA: Diagnosis not present

## 2016-06-24 ENCOUNTER — Encounter: Payer: Self-pay | Admitting: Nurse Practitioner

## 2016-06-24 ENCOUNTER — Ambulatory Visit (INDEPENDENT_AMBULATORY_CARE_PROVIDER_SITE_OTHER): Payer: Medicare Other | Admitting: Nurse Practitioner

## 2016-06-24 ENCOUNTER — Telehealth: Payer: Self-pay | Admitting: Nurse Practitioner

## 2016-06-24 ENCOUNTER — Telehealth: Payer: Self-pay | Admitting: *Deleted

## 2016-06-24 VITALS — BP 152/100 | HR 65 | Ht 71.0 in | Wt 247.8 lb

## 2016-06-24 DIAGNOSIS — I259 Chronic ischemic heart disease, unspecified: Secondary | ICD-10-CM | POA: Diagnosis not present

## 2016-06-24 DIAGNOSIS — I10 Essential (primary) hypertension: Secondary | ICD-10-CM

## 2016-06-24 DIAGNOSIS — Z01818 Encounter for other preprocedural examination: Secondary | ICD-10-CM

## 2016-06-24 DIAGNOSIS — E7800 Pure hypercholesterolemia, unspecified: Secondary | ICD-10-CM

## 2016-06-24 MED ORDER — AMLODIPINE BESYLATE 5 MG PO TABS: 5.0000 mg | ORAL_TABLET | Freq: Every day | ORAL | 6 refills | Status: DC

## 2016-06-24 NOTE — Telephone Encounter (Signed)
New message    Pt calling stating that [name] was suppose to fax a surgical clearance and the doc's office has yet to receive it. Please call. Patient states that this is extremely urgent.

## 2016-06-24 NOTE — Patient Instructions (Addendum)
We will be checking the following labs today - NONE   Medication Instructions:    Continue with your current medicines. BUT  I am adding back your Norvasc - 5 mg a day - take today and then one every day - I have sent this to your drug store. This is to help get your blood pressure down.     Testing/Procedures To Be Arranged:  N/A  Follow-Up:   See me in 3 months    Other Special Instructions:   I will send a note to Dr. Burney Gauze - I think you are an acceptable candidate for your surgery but your BP needs to be better controlled.     If you need a refill on your cardiac medications before your next appointment, please call your pharmacy.   Call the [place] office at [phone] if you have any questions, problems or concerns.

## 2016-06-24 NOTE — Telephone Encounter (Signed)
Pt has been cleared for surgery with Dr. Burney Gauze, faxed paperwork today to [phone].

## 2016-06-24 NOTE — Progress Notes (Signed)
CARDIOLOGY OFFICE NOTE  Date:  06/24/2016    [name] Date of Birth: [birth year] Medical Record #[id number]  PCP:  Wende Neighbors, MD  Cardiologist:  Domenic Polite (has not seen since 01/2013)  Chief Complaint  Patient presents with  . Pre-op Exam    Pre op visit - seen for Dr. Domenic Polite    History of Present Illness: [name] is a 81 y.o. male who presents today for a pre op clearance/new patient. Former patient of Dr. Maren Beach. Last seen in May of 2014 by Dr. Domenic Polite.  He has known CAD with no prior history of PCI. Other issues include colon cancer, HTN, GERD, HLD, PVD and OSA.  Remote stress test from 2008.  Reported echocardiogram from April 2011 noted upper normal LV wall thickness with LVEF 81-85%, grade 2 diastolic dysfunction, trivial aortic regurgitation, mild biatrial enlargement.  Comes in today. Here alone. Says he has not been back here "because nothing is wrong with me". Needing left hand surgery by Dr. Burney Gauze. Records from Dr. [place] Crane list multiple medicines - but the patient is only on 4. Tells me that this is the medicines he has been on for the past several years. No longer on any antihypertensives other than his Inderal. BP running high at home. Says he is active. Uses his treadmill almost every day for about 15 minutes. No chest pain. Not short of breath. Not dizzy or lightheaded. No syncope. He can walk around the block. He is happy with how he is doing.   Past Medical History:  Diagnosis Date  . B12 deficiency 02/07/2014  . B12 deficiency 02/07/2014  . Colon cancer ([place])   . 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 Essential hypertension, benign   . GERD (gastroesophageal reflux disease)   . Mixed hyperlipidemia   . Obstructive sleep apnea    does not use  . Prostate cancer ([place])   . PVD (peripheral vascular disease) ([place])   . Type 2 diabetes mellitus  ([place])     Past Surgical History:  Procedure Laterality Date  . BACK SURGERY    . BALLOON DILATION N/A 08/27/2013   Procedure: BALLOON DILATION;  Surgeon: Rogene Houston, MD;  [facility];  Service: Endoscopy;  Laterality: N/A;  . COLON SURGERY    . COLONOSCOPY N/A 12/17/2013   Procedure: COLONOSCOPY;  Surgeon: Rogene Houston, MD;  [facility];  Service: Endoscopy;  Laterality: N/A;  940  . COLONOSCOPY N/A 02/28/2014   Procedure: COLONOSCOPY;  Surgeon: Rogene Houston, MD;  [facility];  Service: Endoscopy;  Laterality: N/A;  730  . COLONOSCOPY N/A 08/04/2015   Procedure: COLONOSCOPY;  Surgeon: Rogene Houston, MD;  [facility];  Service: Endoscopy;  Laterality: N/A;  200 - moved to 11/11 @ 2:10 - Ann to notify pt  . COLONOSCOPY WITH ESOPHAGOGASTRODUODENOSCOPY (EGD) N/A 08/27/2013   Procedure: COLONOSCOPY WITH ESOPHAGOGASTRODUODENOSCOPY (EGD);  Surgeon: Rogene Houston, MD;  [facility];  Service: Endoscopy;  Laterality: N/A;  730  . KNEE ARTHROSCOPY Left   . KNEE SURGERY Right    total knee  . MALONEY DILATION N/A 08/27/2013   Procedure: Venia Minks DILATION;  Surgeon: Rogene Houston, MD;  [facility];  Service: Endoscopy;  Laterality: N/A;  . PORTACATH PLACEMENT Right 2014  . PROSTATECTOMY    . removal of  port Right   . SAVORY DILATION N/A 08/27/2013   Procedure: SAVORY DILATION;  Surgeon: Rogene Houston, MD;  [facility];  Service: Endoscopy;  Laterality: N/A;  . SHOULDER SURGERY    . TONSILLECTOMY    . VASECTOMY       Medications: Current Outpatient Prescriptions  Medication Sig Dispense Refill  . acetaminophen (TYLENOL) 325 MG tablet Take 650 mg by mouth every 4 (four) hours as needed for mild pain or moderate pain. Take 2 tablets 1 hour prior to Rituxan treatment.    [name] glipiZIDE (GLUCOTROL) 10 MG tablet Take 10 mg by mouth.    . pantoprazole (PROTONIX) 40 MG tablet Take 1 tablet (40 mg total) by  mouth 2 (two) times daily before a meal. 60 tablet 5  . propranolol (INDERAL) 20 MG tablet Take 1 tablet (20 mg total) by mouth 3 (three) times daily. 120 tablet 12  . testosterone cypionate (DEPOTESTOSTERONE CYPIONATE) 200 MG/ML injection Inject 200 mg into the muscle every 14 (fourteen) days.     Nelva Nay SOLOSTAR 300 UNIT/ML SOPN Inject 50 Units into the skin every morning.     [name] amLODipine (NORVASC) 5 MG tablet Take 1 tablet (5 mg total) by mouth daily. 30 tablet 6   No current facility-administered medications for this visit.    Facility-Administered Medications Ordered in Other Visits  Medication Dose Route Frequency Provider Last Rate Last Dose  . heparin lock flush 100 unit/mL  500 Units Intravenous Once Baird Cancer, PA-C      . sodium chloride flush (NS) 0.9 % injection 20 mL  20 mL Intravenous PRN Baird Cancer, PA-C        Allergies: Allergies  Allergen Reactions  . Bee Venom Anaphylaxis  . Adhesive [Tape] Hives  . Ciprofloxacin     Unknown   . Codeine     Unknown    . Iodine     Unknown    . Latex     Unknown    . Povidone-Iodine     Unknown    . Simvastatin     Unknown      Social History: The patient  reports that he has never smoked. He has never used smokeless tobacco. He reports that he does not drink alcohol or use drugs.   Family History: The patient's family history includes Cancer in his brother and brother; Colon cancer in his brother; Diabetes in his father.   Review of Systems: Please see the history of present illness.   Otherwise, the review of systems is positive for none.   All other systems are reviewed and negative.   Physical Exam: VS:  BP (!) 152/100   Pulse 65   Ht '5\' 11"'$  (1.803 m)   Wt 247 lb 12.8 oz (112.4 kg)   SpO2 97% Comment: at rest  BMI 34.56 kg/m  .  BMI Body mass index is 34.56 kg/m.  Wt Readings from Last 3 Encounters:  06/24/16 247 lb 12.8 oz (112.4 kg)  06/03/16 249 lb (112.9 kg)  05/06/16 246 lb 9.6 oz  (111.9 kg)   BP is 160/100 by me.   General: Pleasant. Obese white male who is alert and in no acute distress.   HEENT: Normal.  Neck: Supple, no JVD, carotid bruits, or masses noted.  Cardiac: Regular rate and rhythm.Heart tones are distant. No edema.  Respiratory:  Lungs are clear to auscultation bilaterally with normal work of breathing.  GI: Soft and nontender.  MS:  No deformity or atrophy. Gait and ROM intact.  Skin: Warm and dry. Color is normal.  Neuro:  Strength and sensation are intact and no gross focal deficits noted.  Psych: Alert, appropriate and with normal affect.   LABORATORY DATA:  EKG:  EKG is ordered today. This demonstrates NSR and is unchanged.  Lab Results  Component Value Date   WBC 6.0 06/03/2016   HGB 12.6 (L) 06/03/2016   HCT 38.6 (L) 06/03/2016   PLT 116 (L) 06/03/2016   GLUCOSE 188 (H) 06/03/2016   CHOL 195 05/14/2010   TRIG 115.0 05/14/2010   HDL 55.40 05/14/2010   LDLCALC 117 (H) 05/14/2010   ALT 18 06/03/2016   AST 22 06/03/2016   NA 137 06/03/2016   K 3.9 06/03/2016   CL 102 06/03/2016   CREATININE 1.21 06/03/2016   BUN 16 06/03/2016   CO2 28 06/03/2016   TSH 2.030 10/17/2015   PSA <0.01 06/30/2015   INR 1.22 12/31/2013    BNP (last 3 results) No results for input(s): BNP in the last 8760 hours.  ProBNP (last 3 results) No results for input(s): PROBNP in the last 8760 hours.   Other Studies Reviewed Today:   Assessment/Plan: 1. Pre op clearance - no active cardiac symptoms noted - EKG negative. BP is not controlled - restarting his Norvasc 5 mg a day - to start today. Explained to him that if he presents on Wednesday for his surgery and BP is not controlled - his surgery will be cancelled. He may proceed with his surgery. He is agreeable to starting the Norvasc.   2. CAD - managed with CV risk factor modification - no active symptoms noted. He does not really seem that interested in coming back for routine follow up. I have  offered to see him back in about 3 months.   3. HTN - not controlled - he is off several of his antihypertensives that are listed on Dr. Juel Burrow records - agreeable to going back on Norvasc - I am not convinced he will stay on this long term.   4. HLD - recent labs noted - not on the Lipitor that is listed on PCP's records.   5. Medication noncompliance - not really clear to me as to the etiology.   Current medicines are reviewed with the patient today.  The patient does not have concerns regarding medicines other than what has been noted above.  The following changes have been made:  See above.  Labs/ tests ordered today include:    Orders Placed This Encounter  Procedures  . EKG 12-Lead     Disposition:   FU with me in 3 months.    Patient is agreeable to this plan and will call if any problems develop in the interim.   Signed: Burtis Junes, RN, ANP-C 06/24/2016 10:00 AM  [facility] [address] [place], [place]  [address] Phone: [phone] Fax: [phone]

## 2016-06-25 NOTE — Telephone Encounter (Signed)
S/w pt and sent fax over again to [facility], to [place], [place] @ [phone] per pt's request.  Stated called last night to ov and still haven't received fax.  Sent over 3 times yesterday, two manually and one electronic.  Also sent over today.

## 2016-06-26 ENCOUNTER — Other Ambulatory Visit: Payer: Self-pay | Admitting: Orthopedic Surgery

## 2016-06-26 DIAGNOSIS — M720 Palmar fascial fibromatosis [Dupuytren]: Secondary | ICD-10-CM | POA: Diagnosis not present

## 2016-06-26 DIAGNOSIS — M67442 Ganglion, left hand: Secondary | ICD-10-CM | POA: Diagnosis not present

## 2016-06-26 DIAGNOSIS — M654 Radial styloid tenosynovitis [de Quervain]: Secondary | ICD-10-CM | POA: Diagnosis not present

## 2016-06-28 DIAGNOSIS — R0602 Shortness of breath: Secondary | ICD-10-CM | POA: Diagnosis not present

## 2016-06-28 DIAGNOSIS — J309 Allergic rhinitis, unspecified: Secondary | ICD-10-CM | POA: Diagnosis not present

## 2016-06-28 DIAGNOSIS — J329 Chronic sinusitis, unspecified: Secondary | ICD-10-CM | POA: Diagnosis not present

## 2016-06-28 DIAGNOSIS — M24542 Contracture, left hand: Secondary | ICD-10-CM | POA: Diagnosis not present

## 2016-07-01 DIAGNOSIS — M654 Radial styloid tenosynovitis [de Quervain]: Secondary | ICD-10-CM | POA: Diagnosis not present

## 2016-07-01 DIAGNOSIS — M720 Palmar fascial fibromatosis [Dupuytren]: Secondary | ICD-10-CM | POA: Diagnosis not present

## 2016-07-04 ENCOUNTER — Encounter (HOSPITAL_COMMUNITY): Payer: Medicare Other | Attending: Hematology and Oncology

## 2016-07-04 VITALS — BP 135/71 | HR 73 | Temp 97.9°F | Resp 18

## 2016-07-04 DIAGNOSIS — Z23 Encounter for immunization: Secondary | ICD-10-CM

## 2016-07-04 DIAGNOSIS — E538 Deficiency of other specified B group vitamins: Secondary | ICD-10-CM | POA: Diagnosis present

## 2016-07-04 DIAGNOSIS — C8319 Mantle cell lymphoma, extranodal and solid organ sites: Secondary | ICD-10-CM

## 2016-07-04 DIAGNOSIS — C8310 Mantle cell lymphoma, unspecified site: Secondary | ICD-10-CM | POA: Insufficient documentation

## 2016-07-04 MED ORDER — CYANOCOBALAMIN 1000 MCG/ML IJ SOLN
INTRAMUSCULAR | Status: AC
  Filled 2016-07-04: qty 1

## 2016-07-04 MED ORDER — INFLUENZA VAC SPLIT QUAD 0.5 ML IM SUSY
0.5000 mL | PREFILLED_SYRINGE | Freq: Once | INTRAMUSCULAR | Status: AC
  Administered 2016-07-04: 0.5 mL via INTRAMUSCULAR

## 2016-07-04 MED ORDER — CYANOCOBALAMIN 1000 MCG/ML IJ SOLN
1000.0000 ug | Freq: Once | INTRAMUSCULAR | Status: AC
  Administered 2016-07-04: 1000 ug via INTRAMUSCULAR

## 2016-07-04 MED ORDER — INFLUENZA VAC SPLIT QUAD 0.5 ML IM SUSY
PREFILLED_SYRINGE | INTRAMUSCULAR | Status: AC
  Filled 2016-07-04: qty 0.5

## 2016-07-04 NOTE — Progress Notes (Signed)
[name] presents today for injection per MD orders. B12 1000 mcg administered SQ in right Upper Arm. Administration without incident. Patient tolerated well.  

## 2016-07-04 NOTE — Patient Instructions (Signed)
[place] at [facility] Discharge Instructions  RECOMMENDATIONS MADE BY THE CONSULTANT AND ANY TEST RESULTS WILL BE SENT TO YOUR REFERRING PHYSICIAN.  Vitamin B12 1000 mcg injection given as ordered. Influenza vaccine given as requested. Return as scheduled.  Thank you for choosing [place] at [facility] to provide your oncology and hematology care.  To afford each patient quality time with our provider, please arrive at least 15 minutes before your scheduled appointment time.   Beginning January 23rd 2017 lab work for the [facility] will be done in the  Main lab at [facility] on 1st floor. If you have a lab appointment with the [facility] please come in thru the  Main Entrance and check in at the main information desk  You need to re-schedule your appointment should you arrive 10 or more minutes late.  We strive to give you quality time with our providers, and arriving late affects you and other patients whose appointments are after yours.  Also, if you no show three or more times for appointments you may be dismissed from the clinic at the providers discretion.     Again, thank you for choosing [facility].  Our hope is that these requests will decrease the amount of time that you wait before being seen by our physicians.       _____________________________________________________________  Should you have questions after your visit to [facility], please contact our office at [phone] between the hours of 8:30 a.m. and 4:30 p.m.  Voicemails left after 4:30 p.m. will not be returned until the following business day.  For prescription refill requests, have your pharmacy contact our office.         Resources For Cancer Patients and their Caregivers ? American Cancer Society: Can assist with transportation, wigs, general needs, runs Look Good Feel Better.        434-290-3407 ? Cancer  Care: Provides financial assistance, online support groups, medication/co-pay assistance.  1-800-813-HOPE 408-367-9409) ? [place] Assists [place] cancer patients and their families through emotional , educational and financial support.  9710640726 ? [place] DSS Where to apply for food stamps, Medicaid and utility assistance. 743-580-2636 ? [facility]: Transportation to medical appointments. 901-067-2057 ? Social Security Administration: May apply for disability if have a Stage IV cancer. (660)737-1148 2512035739 ? [employer], Disability and Transit Services: Assists with nutrition, care and transit needs. [place] Support Programs: '@10RELATIVEDAYS'$ @ > Cancer Support Group  2nd Tuesday of the month 1pm-2pm, Journey Room  > Creative Journey  3rd Tuesday of the month 1130am-1pm, Journey Room  > Look Good Feel Better  1st Wednesday of the month 10am-12 noon, Journey Room (Call [place] to register [phone])

## 2016-07-08 DIAGNOSIS — E291 Testicular hypofunction: Secondary | ICD-10-CM | POA: Diagnosis not present

## 2016-07-10 ENCOUNTER — Ambulatory Visit (INDEPENDENT_AMBULATORY_CARE_PROVIDER_SITE_OTHER): Payer: Medicare Other | Admitting: Internal Medicine

## 2016-07-10 ENCOUNTER — Other Ambulatory Visit (INDEPENDENT_AMBULATORY_CARE_PROVIDER_SITE_OTHER): Payer: Medicare Other

## 2016-07-10 ENCOUNTER — Encounter: Payer: Self-pay | Admitting: Internal Medicine

## 2016-07-10 VITALS — BP 120/80 | HR 68 | Ht 71.0 in | Wt 245.0 lb

## 2016-07-10 DIAGNOSIS — R0602 Shortness of breath: Secondary | ICD-10-CM

## 2016-07-10 DIAGNOSIS — I259 Chronic ischemic heart disease, unspecified: Secondary | ICD-10-CM

## 2016-07-10 DIAGNOSIS — J4530 Mild persistent asthma, uncomplicated: Secondary | ICD-10-CM | POA: Diagnosis not present

## 2016-07-10 LAB — NITRIC OXIDE: NITRIC OXIDE: 12

## 2016-07-10 LAB — CBC WITH DIFFERENTIAL/PLATELET
BASOS ABS: 0 10*3/uL (ref 0.0–0.1)
BASOS PCT: 0.2 % (ref 0.0–3.0)
EOS PCT: 0.9 % (ref 0.0–5.0)
Eosinophils Absolute: 0.1 10*3/uL (ref 0.0–0.7)
HEMATOCRIT: 42 % (ref 39.0–52.0)
Hemoglobin: 14.2 g/dL (ref 13.0–17.0)
LYMPHS PCT: 11.6 % — AB (ref 12.0–46.0)
Lymphs Abs: 1.1 10*3/uL (ref 0.7–4.0)
MCHC: 33.8 g/dL (ref 30.0–36.0)
MCV: 91.9 fl (ref 78.0–100.0)
MONOS PCT: 5.3 % (ref 3.0–12.0)
Monocytes Absolute: 0.5 10*3/uL (ref 0.1–1.0)
NEUTROS ABS: 7.8 10*3/uL — AB (ref 1.4–7.7)
Neutrophils Relative %: 82 % — ABNORMAL HIGH (ref 43.0–77.0)
PLATELETS: 144 10*3/uL — AB (ref 150.0–400.0)
RBC: 4.57 Mil/uL (ref 4.22–5.81)
RDW: 16.1 % — ABNORMAL HIGH (ref 11.5–15.5)
WBC: 9.5 10*3/uL (ref 4.0–10.5)

## 2016-07-10 LAB — BRAIN NATRIURETIC PEPTIDE: Pro B Natriuretic peptide (BNP): 226 pg/mL — ABNORMAL HIGH (ref 0.0–100.0)

## 2016-07-10 MED ORDER — AMOXICILLIN-POT CLAVULANATE 875-125 MG PO TABS: 1.0000 | ORAL_TABLET | Freq: Two times a day (BID) | ORAL | 0 refills | Status: AC

## 2016-07-10 MED ORDER — METHYLPREDNISOLONE ACETATE 80 MG/ML IJ SUSP
120.0000 mg | Freq: Once | INTRAMUSCULAR | Status: AC
  Administered 2016-07-10: 120 mg via INTRAMUSCULAR

## 2016-07-10 MED ORDER — BUDESONIDE-FORMOTEROL FUMARATE 80-4.5 MCG/ACT IN AERO: 2.0000 | INHALATION_SPRAY | Freq: Two times a day (BID) | RESPIRATORY_TRACT | 12 refills | Status: DC

## 2016-07-10 NOTE — Patient Instructions (Addendum)
Plan A = Automatic = symbicort 80 Take 2 puffs first thing in am and then another 2 puffs about 12 hours later and change the inderol to where you just take it in AM and afternoon to see what difference if any it makes in your night symptoms  Work on inhaler technique:  relax and gently blow all the way out then take a nice smooth deep breath back in, triggering the inhaler at same time you start breathing in.  Hold for up to 5 seconds if you can. Blow out thru nose. Rinse and gargle with water when done    Plan B = Backup Only use your albuterol(PROAIR)  as a rescue medication to be used if you can't catch your breath by resting or doing a relaxed purse lip breathing pattern.  - The less you use it, the better it will work when you need it. - Ok to use the inhaler up to 2 puffs  every 4 hours if you must but call for appointment if use goes up over your usual need - Don't leave home without it !!  (think of it like the spare tire for your car)   Augmentin 875 mg take one pill twice daily  X 10 days - take at breakfast and supper with large glass of water.  It would help reduce the usual side effects (diarrhea and yeast infections) if you ate cultured yogurt at lunch.   Pantoprazole 40 mg Take 30- 60 min before your first and last meals of the day   GERD (REFLUX)  is an extremely common cause of respiratory symptoms just like yours , many times with no obvious heartburn at all.    It can be treated with medication, but also with lifestyle changes including elevation of the head of your bed (ideally with 6 inch  bed blocks),  Smoking cessation, avoidance of late meals, excessive alcohol, and avoid fatty foods, chocolate, peppermint, colas, red wine, and acidic juices such as orange juice.  NO MINT OR MENTHOL PRODUCTS SO NO COUGH DROPS   USE SUGARLESS CANDY INSTEAD (Jolley ranchers or Stover's or Life Savers) or even ice chips will also do - the key is to swallow to prevent all throat clearing. NO  OIL BASED VITAMINS - use powdered substitutes.    Please remember to go to the lab  department downstairs for your tests - we will call you with the results when they are available.  Please schedule a follow up office visit in 2 weeks, sooner if needed with all active medications in hand

## 2016-07-10 NOTE — Progress Notes (Signed)
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    07/10/2016 1st [facility] office visit/ Krystena Reitter   Chief Complaint  Patient presents with  . Pulmonary Consult    Self referral. Pt c/o SOB for the past 6 months. He c/o SOB "all the time" "worse when I can't get air". He also c/o cough with large amounts of yellow sputum.   initially started with orthopnea x 6 month then sinus/throat/chest sensations congestion and then cough in that order as above and now sleeping in 60 degrees.   Some better on inhaler first saba then symbicort 50% improvement but ran out 4 d prior to OV and no change since then   No obvious other patterns in day to day or daytime variabilty or assoc  cp or chest tightness, subjective wheeze overt     hb symptoms. No unusual exp hx or h/o childhood pna/ asthma or knowledge of premature birth.     Also denies any obvious fluctuation of symptoms with weather or environmental changes or other aggravating or alleviating factors except as outlined above   Current Medications, Allergies, Complete Past Medical History, Past Surgical History, Family History, and Social History were reviewed in [employer] record.                Review of Systems  Constitutional: Negative for activity change, appetite change, chills, fever and unexpected weight change.  HENT: Positive for congestion. Negative for dental problem, postnasal drip, rhinorrhea, sneezing, sore throat, trouble swallowing and voice change.   Eyes: Negative for visual disturbance.    Respiratory: Positive for cough and shortness of breath. Negative for choking.   Cardiovascular: Negative for chest pain and leg swelling.  Gastrointestinal: Negative for abdominal pain, nausea and vomiting.  Genitourinary: Negative for difficulty urinating.  Musculoskeletal: Negative for arthralgias.  Skin: Negative for rash.  Psychiatric/Behavioral: Negative for behavioral problems and confusion.       Objective:   Physical Exam     amb obese wm with upper airway coughing fits and mod hoarse  Wt Readings from Last 3 Encounters:  07/10/16 245 lb (111.1 kg)  06/24/16 247 lb 12.8 oz (112.4 kg)  06/03/16 249 lb (112.9 kg)    Vital signs reviewed - - Note on arrival 02 sats  99% on RA     HEENT: nl dentition, turbinates, and oropharynx. Nl external ear canals without cough reflex   NECK :  without JVD/Nodes/TM/ nl carotid upstrokes bilaterally   LUNGS: no acc muscle use,  Nl contour chest which is clear to A and P bilaterally without cough on insp or exp maneuvers   CV:  RRR  no s3 or murmur or increase in P2, no edema   ABD:  soft and nontender with nl inspiratory excursion in the supine position. No bruits or organomegaly, bowel sounds nl  MS:  Nl gait/ ext warm without deformities, calf tenderness, cyanosis or clubbing No obvious joint restrictions   SKIN: warm and dry without lesions    NEURO:  alert, approp,  nl sensorium with  no motor deficits     I personally reviewed images and agree with radiology impression as follows:   HRCT Chest 06/17/16  No convincing findings of interstitial lung disease. Minimal subpleural reticulation and ground-glass attenuation in the dependent lower lobes is not significantly changed since 05/11/2015, and appears slightly less prominent on the inspiratory sequence, suggesting a combination of mild hypoventilatory change and minimal nonspecific scarring. No traction bronchiectasis or frank honeycombing. 2. Two tiny solid  pulmonary nodules in the right middle lobe and left lower lobe are stable and probably benign. 3. Left lower lobe ground-glass 11 mm nodule is stable. Given persistence, repeat CT is recommended every 2 years until 5 years of stability has been established.    Labs ordered/ reviewed:      Chemistry      Component Value Date/Time   NA 137 06/03/2016 1456   K 3.9 06/03/2016 1456   CL 102 06/03/2016 1456   CO2 28 06/03/2016 1456   BUN 16 06/03/2016 1456   CREATININE 1.21 06/03/2016 1456   CREATININE 1.17 11/28/2014 0955      Component Value Date/Time   CALCIUM 8.6 (L) 06/03/2016 1456   ALKPHOS 57 06/03/2016 1456   AST 22 06/03/2016 1456   ALT 18 06/03/2016 1456   BILITOT 0.6 06/03/2016 1456        Lab Results  Component Value Date   WBC 9.5 07/10/2016   HGB 14.2 07/10/2016   HCT 42.0 07/10/2016   MCV 91.9 07/10/2016   PLT 144.0 (L) 07/10/2016         Lab Results  Component Value Date   TSH 2.030 10/17/2015     Lab Results  Component Value Date   PROBNP 226.0 (H) 07/10/2016       Lab Results  Component Value Date   ESRSEDRATE 10 06/03/2016   ESRSEDRATE 2 09/14/2013         I personally reviewed images and agree with radiology impression as follows:   HRCT  Chest   06/17/16 . No convincing findings of interstitial lung disease. Minimal subpleural reticulation and ground-glass attenuation in the dependent lower lobes is not significantly changed since 05/11/2015, and appears slightly less prominent on the inspiratory sequence, suggesting a combination of mild hypoventilatory change and minimal nonspecific scarring. No traction bronchiectasis or frank honeycombing. 2. Two tiny solid pulmonary nodules in the right middle lobe and left lower lobe are stable and probably benign. 3. Left lower lobe ground-glass 11 mm nodule is stable. Given persistence, repeat CT is recommended every 2 years until 5 years of stability has been established.           Assessment & Plan:

## 2016-07-11 DIAGNOSIS — J4530 Mild persistent asthma, uncomplicated: Secondary | ICD-10-CM | POA: Insufficient documentation

## 2016-07-11 LAB — RESPIRATORY ALLERGY PROFILE REGION II ~~LOC~~
Allergen, Cedar tree, t12: <0.1 kU/L
Allergen, Cottonwood, t14: <0.1 kU/L
Allergen, D pternoyssinus,d7: <0.1 kU/L
Allergen, Mouse Urine Protein, e78: <0.1 kU/L
Allergen, Oak,t7: <0.1 kU/L
Aspergillus fumigatus, m3: <0.1 kU/L
Bermuda Grass: <0.1 kU/L
Cat Dander: <0.1 kU/L
IGE (IMMUNOGLOBULIN E), SERUM: 20 kU/L (ref <115)
Pecan/Hickory Tree IgE: <0.1 kU/L
Timothy Grass: <0.1 kU/L

## 2016-07-11 NOTE — Assessment & Plan Note (Addendum)
-   FENO 07/10/2016  =   12  - Spirometry 07/10/2016  FEV1 2.02 (66%)  Ratio 68 but contour not physiologic on effort dep portion of f/v loop - 07/10/2016  After extensive coaching HFA effectiveness =    50% > try symb 80 2bid    Will try lower dose of symbicort for now and return p 2 weeks trial as I'm not confident this is asthma but if it is he'll need to find a new tremor medication as inderal will block much of the benefit - since symptoms are worse at hs and doesn't need the inderal then anyway sugggest he leave off the pm dose for now

## 2016-07-11 NOTE — Assessment & Plan Note (Signed)
Symptoms are markedly disproportionate to objective findings and not clear this is a lung problem but pt does appear to have difficult airway management issues. DDX of  difficult airways management almost all start with A and  include Adherence, Ace Inhibitors, Acid Reflux, Active Sinus Disease, Alpha 1 Antitripsin deficiency, Anxiety masquerading as Airways dz,  ABPA,  Allergy(esp in young), Aspiration (esp in elderly), Adverse effects of meds,  Active smokers, A bunch of PE's (a small clot burden can't cause this syndrome unless there is already severe underlying pulm or vascular dz with poor reserve) plus two Bs  = Bronchiectasis and Beta blocker use..and one C= CHF   Adherence is always the initial "prime suspect" and is a multilayered concern that requires a "trust but verify" approach in every patient - starting with knowing how to use medications, especially inhalers, correctly, keeping up with refills and understanding the fundamental difference between maintenance and prns vs those medications only taken for a very short course and then stopped and not refilled.  - see asthma re use of saba  ? Allergy/ asthma :  feno against this but certainly might benefit from allergy profile, trial of low lose symbicort as high doses can aggravate the uacs in the ddx (see separate a/p)   ? Active sinus dz > augmentin x 10 days then consider sinus ct  ? Acid (or non-acid) GERD > always difficult to exclude as up to 75% of pts in some series report no assoc GI/ Heartburn symptoms> rec continue max (24h)  acid suppression and diet restrictions/ reviewed     ? BB effects > note inderal use correlates with worse symptoms   ? chf > bnp relatively low but certainly does not exclude diastolic dysfunction > consider echo next    Total time devoted to counseling  = 35/73mreview case with pt/ discussion of options/alternatives/ personally creating written instructions  in presence of pt  then going over those  specific  Instructions directly with the pt including how to use all of the meds but in particular covering each new medication in detail and the difference between the maintenance/automatic meds and the prns using an action plan format for the latter.

## 2016-07-12 NOTE — Progress Notes (Signed)
Spoke with pt and notified of results per Dr. Wert. Pt verbalized understanding and denied any questions. 

## 2016-07-15 ENCOUNTER — Encounter (HOSPITAL_BASED_OUTPATIENT_CLINIC_OR_DEPARTMENT_OTHER): Payer: Medicare Other

## 2016-07-15 VITALS — BP 137/80 | HR 68 | Resp 16 | Ht 71.0 in | Wt 245.0 lb

## 2016-07-15 DIAGNOSIS — C8310 Mantle cell lymphoma, unspecified site: Secondary | ICD-10-CM

## 2016-07-15 DIAGNOSIS — Z452 Encounter for adjustment and management of vascular access device: Secondary | ICD-10-CM

## 2016-07-15 DIAGNOSIS — Z95828 Presence of other vascular implants and grafts: Secondary | ICD-10-CM

## 2016-07-15 MED ORDER — HEPARIN SOD (PORK) LOCK FLUSH 100 UNIT/ML IV SOLN
500.0000 [IU] | Freq: Once | INTRAVENOUS | Status: AC
  Administered 2016-07-15: 500 [IU] via INTRAVENOUS

## 2016-07-15 MED ORDER — HEPARIN SOD (PORK) LOCK FLUSH 100 UNIT/ML IV SOLN
INTRAVENOUS | Status: AC
  Filled 2016-07-15: qty 5

## 2016-07-15 MED ORDER — SODIUM CHLORIDE 0.9% FLUSH
10.0000 mL | INTRAVENOUS | Status: DC | PRN
  Administered 2016-07-15: 10 mL via INTRAVENOUS
  Filled 2016-07-15: qty 10

## 2016-07-15 NOTE — Progress Notes (Signed)
Pt here today for port flush. [name] presented for Portacath access and flush. Portacath located right chest wall accessed with  H 20 needle. Good blood return present. Portacath flushed with 16m NS and 500U/555mHeparin and needle removed intact. Procedure without incident. Patient tolerated procedure well. Pt stable and discharged home ambulatory,

## 2016-07-19 ENCOUNTER — Telehealth: Payer: Self-pay | Admitting: Internal Medicine

## 2016-07-19 MED ORDER — PROMETHAZINE HCL 12.5 MG PO TABS: 12.5000 mg | ORAL_TABLET | ORAL | 0 refills | Status: DC | PRN

## 2016-07-19 NOTE — Telephone Encounter (Signed)
Spoke with pt. He is aware of MW's recommendation. Pt would like to have the prescription sent in anyway. Rx has been called in. Nothing further was needed.

## 2016-07-19 NOTE — Telephone Encounter (Signed)
Ok to stop it now anyway then regroup   If still wants med then phenergan 12.5 mg #12 one q4h prn

## 2016-07-19 NOTE — Telephone Encounter (Signed)
Spoke with pt. States that he is experiencing nausea from taking Augmentin. He wants medication sent to his pharmacy for this.  MW - please advise. Thanks.

## 2016-07-22 DIAGNOSIS — E291 Testicular hypofunction: Secondary | ICD-10-CM | POA: Diagnosis not present

## 2016-07-30 ENCOUNTER — Encounter: Payer: Self-pay | Admitting: Internal Medicine

## 2016-07-30 ENCOUNTER — Ambulatory Visit (INDEPENDENT_AMBULATORY_CARE_PROVIDER_SITE_OTHER): Payer: Medicare Other | Admitting: Internal Medicine

## 2016-07-30 VITALS — BP 132/92 | HR 66 | Ht 71.0 in | Wt 244.0 lb

## 2016-07-30 DIAGNOSIS — I259 Chronic ischemic heart disease, unspecified: Secondary | ICD-10-CM

## 2016-07-30 DIAGNOSIS — R05 Cough: Secondary | ICD-10-CM | POA: Diagnosis not present

## 2016-07-30 DIAGNOSIS — R058 Other specified cough: Secondary | ICD-10-CM | POA: Insufficient documentation

## 2016-07-30 DIAGNOSIS — R0602 Shortness of breath: Secondary | ICD-10-CM | POA: Diagnosis not present

## 2016-07-30 DIAGNOSIS — R918 Other nonspecific abnormal finding of lung field: Secondary | ICD-10-CM

## 2016-07-30 DIAGNOSIS — J4530 Mild persistent asthma, uncomplicated: Secondary | ICD-10-CM | POA: Diagnosis not present

## 2016-07-30 MED ORDER — BUDESONIDE-FORMOTEROL FUMARATE 80-4.5 MCG/ACT IN AERO: 2.0000 | INHALATION_SPRAY | Freq: Two times a day (BID) | RESPIRATORY_TRACT | 0 refills | Status: DC

## 2016-07-30 NOTE — Assessment & Plan Note (Addendum)
-   Allergy profile 07/10/16  >  Eos 0.1 /  IgE  20 neg RAST Trial of 1st gen H1 07/30/2016 >>>     Upper airway cough syndrome (previously labeled PNDS) , is  so named because it's frequently impossible to sort out how much is  CR/sinusitis with freq throat clearing (which can be related to primary GERD)   vs  causing  secondary (" extra esophageal")  GERD from wide swings in gastric pressure that occur with throat clearing, often  promoting self use of mint and menthol lozenges that reduce the lower esophageal sphincter tone and exacerbate the problem further in a cyclical fashion.   These are the same pts (now being labeled as having "irritable larynx syndrome" by some cough centers) who not infrequently have a history of having failed to tolerate ace inhibitors,  dry powder inhalers or biphosphonates or report having atypical/extraesophageal reflux symptoms that don't respond to standard doses of PPI  and are easily confused as having aecopd or asthma flares by even experienced allergists/ pulmonologists (myself included).   Need to  Try 1st gen H1 and ? GI eval for gerd next > pulmonary f/u is prn

## 2016-07-30 NOTE — Patient Instructions (Addendum)
For drainage / throat tickle try take CHLORPHENIRAMINE  4 mg - take one every 4 hours as needed - available over the counter- may cause drowsiness so start with just a bedtime dose or two and see how you tolerate it before trying in daytime    Try off symbicort 80 x one week to see what difference if any this makes and if worse breathing/ coughing then start it back    You need another CHEST CT scan in 2 years and we will call you to make sure you have it- there is nothing on this scan to suggest you have a significant lung problem or any explanation for your breathing problem which appears to be either related to drainage down from your throat or up from your stomach  Try to avoid throat clearing GERD (REFLUX)  is an extremely common cause of respiratory symptoms just like yours , many times with no obvious heartburn at all.    It can be treated with medication, but also with lifestyle changes including elevation of the head of your bed (ideally with 6 inch  bed blocks),  Smoking cessation, avoidance of late meals, excessive alcohol, and avoid fatty foods, chocolate, peppermint, colas, red wine, and acidic juices such as orange juice.  NO MINT OR MENTHOL PRODUCTS SO NO COUGH DROPS   USE SUGARLESS CANDY INSTEAD (Jolley ranchers or Stover's or Life Savers) or even ice chips will also do - the key is to swallow to prevent all throat clearing. NO OIL BASED VITAMINS - use powdered substitutes.     I will be suggesting an echocardiogram to complete your workup but fine to wait until your next visit with [name] - TAKE ALL MEDICATIONS TO ALL OFFICE VISITS    Pulmonary follow up is as needed

## 2016-07-30 NOTE — Assessment & Plan Note (Addendum)
Spirometry 07/10/2016  FEV1 2.02 (66%)  Ratio 68 but contour not physiologic on effort dep portion of f/v loop - 07/10/2016  After extensive coaching HFA effectiveness =    50% > try symb 80 2bid   - FENO 07/10/2016  =   12 s ICS - Allergy profile 07/10/16  >  Eos 0.1 /  IgE  20 neg RAST  Completely clear on exam today off symbicort and sob not reproducible with walking fast pace / also no better with lower doses of inderal   rx so I do not favor a dx of asthma here and rec he try off symbicort 80 for a few days to see what difference if any this makes and if worse start it back immediately   - The proper method of use, as well as anticipated side effects, of a metered-dose inhaler are discussed and demonstrated to the patient. Improved effectiveness after extensive coaching during this visit to a level of approximately 75 % from a baseline of 50 %     I had an extended discussion with the patient reviewing all relevant studies completed to date and  lasting 15 to 20 minutes of a 25 minute visit    Each maintenance medication was reviewed in detail including most importantly the difference between maintenance and prns and under what circumstances the prns are to be triggered using an action plan format that is not reflected in the computer generated alphabetically organized AVS.    Please see instructions for details which were reviewed in writing and the patient given a copy highlighting the part that I personally wrote and discussed at today's ov.

## 2016-07-30 NOTE — Assessment & Plan Note (Signed)
07/30/2016   Walked RA  2 laps @ 185 ft each stopped due to  Dizzy/ no sob or desat at fast pace/ off symbicort   W/u is unimpressive for asthma but indeterminate for chf > would complete the w/u with echo

## 2016-07-30 NOTE — Progress Notes (Signed)
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     History of Present Illness  07/10/2016 1st [place] [facility] office visit/ [facility]   Chief Complaint  Patient presents with  . Pulmonary Consult    Self referral. Pt c/o SOB for the past 6 months. He c/o SOB "all the time" "worse when I can't get air". He also c/o cough with large amounts of yellow sputum.   initially started with orthopnea x 6 month then sinus/throat/chest sensations congestion and then cough in that order as above and now sleeping in 60 degrees.   Some better on inhaler first saba then symbicort 50% improvement but ran out 4 d prior to OV and no change since then  rec Plan A = Automatic = symbicort 80 Take 2 puffs first thing in am and then another 2 puffs about 12 hours later and change the inderol to where you just take it in AM and afternoon to see what difference if any it makes in your night symptoms Work on inhaler technique:   Plan B = Backup Only use your albuterol(PROAIR)  as a rescue medication Augmentin 875 mg take one pill twice daily  X 10 days - Pantoprazole 40 mg Take 30- 60 min before your first and last meals of the day  GERD (REFLUX)  Diet   Please remember to go to the lab  department downstairs for your tests - we will call you with the results when they are available. Please schedule a follow up office visit in 2 weeks, sooner if needed with all active medications in hand    07/30/2016  f/u ov/Wert re: sob unexplained, did not bring meds "they are the same"  (they are not)  Chief  Complaint  Patient presents with  . Follow-up    Still feeling SOB all the time even at rest, used up all of his inhalers, Would like to dicuss CT scan results, Pt. denies chest pain   only taking one inderal per day / on no inhalers at all and no change on vs off or on lower inderal dose but shaking is no worse on just the am dose  No obvious day to day or daytime variability or assoc excess/ purulent sputum or mucus plugs or hemoptysis or cp or chest tightness, subjective wheeze or overt sinus or hb symptoms. No unusual exp hx or h/o childhood pna/ asthma or knowledge of premature birth.  Sleeping ok without nocturnal  or early am exacerbation  of respiratory  c/o's or need for noct saba. Also denies any obvious fluctuation of symptoms with weather or environmental changes or other aggravating or alleviating factors except as outlined above   Current Medications, Allergies, Complete Past Medical History, Past Surgical History, Family History, and Social History were reviewed in [employer] record.  ROS  The following are not active complaints unless bolded sore throat, dysphagia, dental problems, itching, sneezing,  nasal congestion or excess/ purulent secretions, ear ache,   fever, chills, sweats, unintended wt loss,  classically pleuritic or exertional cp,  orthopnea pnd or leg swelling, presyncope, palpitations, abdominal pain, anorexia, nausea, vomiting, diarrhea  or change in bowel or bladder habits, change in stools or urine, dysuria,hematuria,  rash, arthralgias, visual complaints, headache, numbness, weakness or ataxia or problems with walking or coordination,  change in mood/affect or memory.                Objective:   Physical Exam     amb obese wm with vigorous throat clearing   07/30/2016        244   07/10/16 245 lb (111.1 kg)  06/24/16 247 lb 12.8 oz (112.4 kg)  06/03/16 249 lb (112.9 kg)    Vital signs reviewed - - Note on arrival 02 sats  96%  on RA     HEENT: nl dentition, turbinates, and oropharynx. Nl external ear canals without cough reflex   NECK :  without JVD/Nodes/TM/ nl carotid upstrokes bilaterally   LUNGS: no acc muscle use,  Nl contour chest which is clear to A and P bilaterally without cough on insp or exp maneuvers   CV:  RRR  no s3 or murmur or increase in P2, no edema   ABD:  soft and nontender with nl inspiratory excursion in the supine position. No bruits or organomegaly, bowel sounds nl  MS:  Nl gait/ ext warm without deformities, calf tenderness, cyanosis or clubbing No obvious joint restrictions   SKIN: warm and dry without lesions    NEURO:  alert, approp, nl sensorium with  no motor deficits     I personally reviewed images and agree with radiology impression as follows:   HRCT Chest 06/17/16  No convincing findings of interstitial lung disease. Minimal subpleural reticulation and ground-glass attenuation in the dependent lower lobes is not significantly changed since 05/11/2015, and appears slightly less prominent on the inspiratory sequence, suggesting a combination of mild hypoventilatory change and minimal nonspecific scarring. No traction bronchiectasis or frank honeycombing. 2. Two tiny solid pulmonary nodules in the right middle lobe and left lower lobe are stable and probably benign. 3. Left lower lobe ground-glass 11 mm nodule is stable. Given persistence, repeat CT is recommended every 2 years until 5 years of stability has been established.    Labs ordered/ reviewed:      Chemistry      Component Value Date/Time   NA 137 06/03/2016 1456   K 3.9 06/03/2016 1456   CL 102 06/03/2016 1456   CO2 28 06/03/2016 1456   BUN 16 06/03/2016 1456   CREATININE 1.21 06/03/2016 1456   CREATININE 1.17 11/28/2014 0955      Component Value Date/Time   CALCIUM 8.6 (L) 06/03/2016 1456   ALKPHOS 57 06/03/2016 1456   AST 22 06/03/2016 1456   ALT 18 06/03/2016 1456   BILITOT 0.6  06/03/2016 1456        Lab Results  Component Value Date   WBC 9.5 07/10/2016   HGB 14.2 07/10/2016   HCT 42.0 07/10/2016   MCV 91.9 07/10/2016   PLT 144.0 (L) 07/10/2016         Lab Results  Component Value Date   TSH 2.030 10/17/2015     Lab Results  Component Value Date   PROBNP 226.0 (H) 07/10/2016       Lab Results  Component Value Date   ESRSEDRATE 10 06/03/2016   ESRSEDRATE 2 09/14/2013              Assessment & Plan:

## 2016-08-01 DIAGNOSIS — R918 Other nonspecific abnormal finding of lung field: Secondary | ICD-10-CM | POA: Insufficient documentation

## 2016-08-01 NOTE — Assessment & Plan Note (Signed)
See CT chest 06/17/16 > in computer for recall 06/17/18 as per fleischner society guidelines   CT results reviewed with pt >>> Too small for PET or bx, not suspicious enough for excisional bx > really only option for now is follow the Fleischner society guidelines as rec by radiology = 2 years   Discussed in detail all the  indications, usual  risks and alternatives  relative to the benefits with patient who agrees to proceed with conservative f/u as outlined

## 2016-08-05 ENCOUNTER — Encounter (HOSPITAL_COMMUNITY): Payer: Medicare Other | Attending: Hematology and Oncology

## 2016-08-05 VITALS — BP 115/80 | HR 84 | Temp 97.7°F | Resp 18

## 2016-08-05 DIAGNOSIS — E538 Deficiency of other specified B group vitamins: Secondary | ICD-10-CM

## 2016-08-05 DIAGNOSIS — C8319 Mantle cell lymphoma, extranodal and solid organ sites: Secondary | ICD-10-CM

## 2016-08-05 DIAGNOSIS — C8310 Mantle cell lymphoma, unspecified site: Secondary | ICD-10-CM | POA: Insufficient documentation

## 2016-08-05 MED ORDER — CYANOCOBALAMIN 1000 MCG/ML IJ SOLN
1000.0000 ug | Freq: Once | INTRAMUSCULAR | Status: AC
  Administered 2016-08-05: 1000 ug via INTRAMUSCULAR

## 2016-08-05 NOTE — Patient Instructions (Signed)
[facility] at [facility] Discharge Instructions  RECOMMENDATIONS MADE BY THE CONSULTANT AND ANY TEST RESULTS WILL BE SENT TO YOUR REFERRING PHYSICIAN.  Vitamin B12 1000 mcg injection given as ordered. Return as scheduled.  Thank you for choosing  Can[facility] Center at [facility] to provide your oncology and hematology care.  To afford each patient quality time with our provider, please arrive at least 15 minutes before your scheduled appointment time.   Beginning January 23rd 2017 lab work for the Cancer Center will be done in the  Main lab at [place] on 1st floor. If you have a lab appointment with the Cancer Center please come in thru the  Main Entrance and check in at the main information desk  You need to re-schedule your appointment should you arrive 10 or more minutes late.  We strive to give you quality time with our providers, and arriving late affects you and other patients whose appointments are after yours.  Also, if you no show three or more times for appointments you may be dismissed from the clinic at the providers discretion.     Again, thank you for choosing [facility].  Our hope is that these requests will decrease the amount of time that you wait before being seen by our physicians.       _____________________________________________________________  Should you have questions after your visit to [facility], please contact our office at [phone] between the hours of 8:30 a.m. and 4:30 p.m.  Voicemails left after 4:30 p.m. will not be returned until the following business day.  For prescription refill requests, have your pharmacy contact our office.         Resources For Cancer Patients and their Caregivers ? American Cancer Society: Can assist with transportation, wigs, general needs, runs Look Good Feel Better.        1-888-227-6333 ? Cancer Care: Provides financial assistance, online support  groups, medication/co-pay assistance.  1-800-813-HOPE (4673) ? [facility] Assists [place] cancer patients and their families through emotional , educational and financial support.  336-427-4357 ? [place] DSS Where to apply for food stamps, Medicaid and utility assistance. 336-342-1394 ? RCATS: Transportation to medical appointments. 336-347-2287 ? Social Security Administration: May apply for disability if have a Stage IV cancer. 336-342-7796 1-800-772-1213 ? [place] Aging, Disability and Transit Services: Assists with nutrition, care and transit needs. 336-349-2343  Cancer Center Support Programs: @10RELATIVEDAYS@ > Cancer Support Group  2nd Tuesday of the month 1pm-2pm, Journey Room  > Creative Journey  3rd Tuesday of the month 1130am-1pm, Journey Room  > Look Good Feel Better  1st Wednesday of the month 10am-12 noon, Journey Room (Call American Cancer Society to register 1-800-395-5775)   

## 2016-08-05 NOTE — Progress Notes (Unsigned)
[name] presents today for injection per MD orders. B12 1000 mcg administered IM in right Upper Arm. Administration without incident. Patient tolerated well.

## 2016-08-06 DIAGNOSIS — E291 Testicular hypofunction: Secondary | ICD-10-CM | POA: Diagnosis not present

## 2016-08-09 DIAGNOSIS — B351 Tinea unguium: Secondary | ICD-10-CM | POA: Diagnosis not present

## 2016-08-09 DIAGNOSIS — E1142 Type 2 diabetes mellitus with diabetic polyneuropathy: Secondary | ICD-10-CM | POA: Diagnosis not present

## 2016-08-12 DIAGNOSIS — N471 Phimosis: Secondary | ICD-10-CM | POA: Diagnosis not present

## 2016-08-12 DIAGNOSIS — N481 Balanitis: Secondary | ICD-10-CM | POA: Diagnosis not present

## 2016-08-19 ENCOUNTER — Ambulatory Visit (HOSPITAL_COMMUNITY)
Admission: RE | Admit: 2016-08-19 | Discharge: 2016-08-19 | Disposition: A | Discharge status: Home / Self Care | Payer: Medicare Other | Source: Ambulatory Visit | Attending: Internal Medicine | Admitting: Internal Medicine

## 2016-08-19 ENCOUNTER — Other Ambulatory Visit (HOSPITAL_COMMUNITY): Payer: Self-pay | Admitting: Internal Medicine

## 2016-08-19 DIAGNOSIS — K5790 Diverticulosis of intestine, part unspecified, without perforation or abscess without bleeding: Secondary | ICD-10-CM | POA: Insufficient documentation

## 2016-08-19 DIAGNOSIS — K8020 Calculus of gallbladder without cholecystitis without obstruction: Secondary | ICD-10-CM | POA: Diagnosis not present

## 2016-08-19 DIAGNOSIS — R1030 Lower abdominal pain, unspecified: Secondary | ICD-10-CM | POA: Diagnosis not present

## 2016-08-19 DIAGNOSIS — R319 Hematuria, unspecified: Secondary | ICD-10-CM

## 2016-08-19 DIAGNOSIS — N200 Calculus of kidney: Secondary | ICD-10-CM | POA: Diagnosis not present

## 2016-08-19 DIAGNOSIS — I7090 Unspecified atherosclerosis: Secondary | ICD-10-CM | POA: Diagnosis not present

## 2016-08-19 DIAGNOSIS — M545 Low back pain: Secondary | ICD-10-CM | POA: Diagnosis not present

## 2016-08-22 DIAGNOSIS — N529 Male erectile dysfunction, unspecified: Secondary | ICD-10-CM | POA: Diagnosis not present

## 2016-08-22 DIAGNOSIS — E039 Hypothyroidism, unspecified: Secondary | ICD-10-CM | POA: Diagnosis not present

## 2016-08-22 DIAGNOSIS — E782 Mixed hyperlipidemia: Secondary | ICD-10-CM | POA: Diagnosis not present

## 2016-08-22 DIAGNOSIS — E119 Type 2 diabetes mellitus without complications: Secondary | ICD-10-CM | POA: Diagnosis not present

## 2016-08-22 DIAGNOSIS — E785 Hyperlipidemia, unspecified: Secondary | ICD-10-CM | POA: Diagnosis not present

## 2016-08-22 DIAGNOSIS — R7301 Impaired fasting glucose: Secondary | ICD-10-CM | POA: Diagnosis not present

## 2016-08-22 DIAGNOSIS — E1122 Type 2 diabetes mellitus with diabetic chronic kidney disease: Secondary | ICD-10-CM | POA: Diagnosis not present

## 2016-08-22 DIAGNOSIS — I10 Essential (primary) hypertension: Secondary | ICD-10-CM | POA: Diagnosis not present

## 2016-08-26 ENCOUNTER — Encounter (HOSPITAL_COMMUNITY): Payer: Medicare Other

## 2016-08-26 DIAGNOSIS — E291 Testicular hypofunction: Secondary | ICD-10-CM | POA: Diagnosis not present

## 2016-08-26 DIAGNOSIS — I10 Essential (primary) hypertension: Secondary | ICD-10-CM | POA: Diagnosis not present

## 2016-08-26 DIAGNOSIS — E782 Mixed hyperlipidemia: Secondary | ICD-10-CM | POA: Diagnosis not present

## 2016-08-26 DIAGNOSIS — N182 Chronic kidney disease, stage 2 (mild): Secondary | ICD-10-CM | POA: Diagnosis not present

## 2016-08-26 DIAGNOSIS — E1122 Type 2 diabetes mellitus with diabetic chronic kidney disease: Secondary | ICD-10-CM | POA: Diagnosis not present

## 2016-08-30 ENCOUNTER — Telehealth (INDEPENDENT_AMBULATORY_CARE_PROVIDER_SITE_OTHER): Payer: Self-pay | Admitting: *Deleted

## 2016-08-30 NOTE — Telephone Encounter (Signed)
Patient called and states that he is having left lower abdominal pain. This has been ongoing for 2 months. He had a CT a week to two weeks ago , and has not heard anything. He also says that he has had pneumonia and was placed on an antibiotic.  Patient is asking if he needs an appointment with Dr.Rehman to see about the pain in his side.  Discussed with Dr. Laural Golden, he says that the patient should be given an appointment with him.  Patient called and made aware that he will get a call next week to let him know when the appointment is. Forwarded to [facility] arrange.

## 2016-09-02 ENCOUNTER — Ambulatory Visit (HOSPITAL_COMMUNITY): Payer: Medicare Other | Admitting: Oncology

## 2016-09-02 DIAGNOSIS — E291 Testicular hypofunction: Secondary | ICD-10-CM | POA: Diagnosis not present

## 2016-09-04 ENCOUNTER — Ambulatory Visit (HOSPITAL_COMMUNITY): Payer: Medicare Other

## 2016-09-04 ENCOUNTER — Other Ambulatory Visit (HOSPITAL_COMMUNITY): Payer: Medicare Other

## 2016-09-04 ENCOUNTER — Ambulatory Visit (HOSPITAL_COMMUNITY): Payer: Medicare Other | Admitting: Oncology

## 2016-09-05 ENCOUNTER — Encounter (HOSPITAL_COMMUNITY): Payer: Medicare Other | Attending: Hematology and Oncology | Admitting: Oncology

## 2016-09-05 ENCOUNTER — Encounter (HOSPITAL_COMMUNITY): Payer: Self-pay | Admitting: Oncology

## 2016-09-05 ENCOUNTER — Encounter (HOSPITAL_BASED_OUTPATIENT_CLINIC_OR_DEPARTMENT_OTHER): Payer: Medicare Other

## 2016-09-05 ENCOUNTER — Ambulatory Visit (HOSPITAL_COMMUNITY): Payer: Medicare Other

## 2016-09-05 ENCOUNTER — Other Ambulatory Visit (HOSPITAL_COMMUNITY): Payer: Medicare Other

## 2016-09-05 VITALS — BP 130/69 | HR 56 | Temp 98.5°F | Resp 18 | Ht 71.0 in | Wt 234.0 lb

## 2016-09-05 DIAGNOSIS — C8310 Mantle cell lymphoma, unspecified site: Secondary | ICD-10-CM | POA: Diagnosis not present

## 2016-09-05 DIAGNOSIS — C8319 Mantle cell lymphoma, extranodal and solid organ sites: Secondary | ICD-10-CM

## 2016-09-05 DIAGNOSIS — E538 Deficiency of other specified B group vitamins: Secondary | ICD-10-CM

## 2016-09-05 DIAGNOSIS — Z95828 Presence of other vascular implants and grafts: Secondary | ICD-10-CM

## 2016-09-05 DIAGNOSIS — C61 Malignant neoplasm of prostate: Secondary | ICD-10-CM

## 2016-09-05 MED ORDER — SODIUM CHLORIDE 0.9% FLUSH
10.0000 mL | INTRAVENOUS | Status: DC | PRN
  Administered 2016-09-05: 10 mL via INTRAVENOUS
  Filled 2016-09-05: qty 10

## 2016-09-05 MED ORDER — CYANOCOBALAMIN 1000 MCG/ML IJ SOLN
INTRAMUSCULAR | Status: AC
  Filled 2016-09-05: qty 1

## 2016-09-05 MED ORDER — CYANOCOBALAMIN 1000 MCG/ML IJ SOLN
1000.0000 ug | Freq: Once | INTRAMUSCULAR | Status: AC
  Administered 2016-09-05: 1000 ug via INTRAMUSCULAR

## 2016-09-05 MED ORDER — HEPARIN SOD (PORK) LOCK FLUSH 100 UNIT/ML IV SOLN
500.0000 [IU] | Freq: Once | INTRAVENOUS | Status: AC
  Administered 2016-09-05: 500 [IU] via INTRAVENOUS

## 2016-09-05 MED ORDER — HEPARIN SOD (PORK) LOCK FLUSH 100 UNIT/ML IV SOLN
INTRAVENOUS | Status: AC
  Filled 2016-09-05: qty 5

## 2016-09-05 NOTE — Patient Instructions (Signed)
[place] at [facility] Discharge Instructions  RECOMMENDATIONS MADE BY THE CONSULTANT AND ANY TEST RESULTS WILL BE SENT TO YOUR REFERRING PHYSICIAN.  You were seen today by Kirby Crigler PA-C. Return every 6 weeks for port flushes. Return in 3 months for labs and follow up.   Thank you for choosing [place] at [facility] to provide your oncology and hematology care.  To afford each patient quality time with our provider, please arrive at least 15 minutes before your scheduled appointment time.   Beginning January 23rd 2017 lab work for the [facility] will be done in the  Main lab at [facility] on 1st floor. If you have a lab appointment with the [facility] please come in thru the  Main Entrance and check in at the main information desk  You need to re-schedule your appointment should you arrive 10 or more minutes late.  We strive to give you quality time with our providers, and arriving late affects you and other patients whose appointments are after yours.  Also, if you no show three or more times for appointments you may be dismissed from the clinic at the providers discretion.     Again, thank you for choosing [facility].  Our hope is that these requests will decrease the amount of time that you wait before being seen by our physicians.       _____________________________________________________________  Should you have questions after your visit to [facility], please contact our office at [phone] between the hours of 8:30 a.m. and 4:30 p.m.  Voicemails left after 4:30 p.m. will not be returned until the following business day.  For prescription refill requests, have your pharmacy contact our office.         Resources For Cancer Patients and their Caregivers ? American Cancer Society: Can assist with transportation, wigs, general needs, runs Look Good Feel Better.         867 481 6767 ? Cancer Care: Provides financial assistance, online support groups, medication/co-pay assistance.  1-800-813-HOPE (364)150-9523) ? [facility] Assists [place] cancer patients and their families through emotional , educational and financial support.  (217) 619-4852 ? [place] DSS Where to apply for food stamps, Medicaid and utility assistance. 212-737-6820 ? RCATS: Transportation to medical appointments. 937 252 9685 ? Social Security Administration: May apply for disability if have a Stage IV cancer. 510-304-0241 774-778-6558 ? [employer], Disability and Transit Services: Assists with nutrition, care and transit needs. [place] Support Programs: @10RELATIVEDAYS @ > Cancer Support Group  2nd Tuesday of the month 1pm-2pm, Journey Room  > Creative Journey  3rd Tuesday of the month 1130am-1pm, Journey Room  > Look Good Feel Better  1st Wednesday of the month 10am-12 noon, Journey Room (Call [place] to register [phone])

## 2016-09-05 NOTE — Progress Notes (Signed)
Brandon Neighbors, MD [place] [id number]  Mantle cell lymphoma of solid organ excluding spleen [facility]) - Plan: CBC with Differential, Comprehensive metabolic panel, Lactate dehydrogenase, CBC with Differential, Comprehensive metabolic panel, Lactate dehydrogenase  B12 deficiency  Prostate cancer ([place]) - Plan: PSA, PSA  CURRENT THERAPY: Surveillance per NCCN guidelines  INTERVAL HISTORY: [name] 81 y.o. male returns for followup of Mantle Cell lymphoma of the large intestine diagnosed 08/2013, S/P BR x 6 cycles from 09/14/2013- 02/07/2014, followed by maintenance Rituxan 05/10/2014- 04/01/2016. Colonoscopy on 02/28/2014 by Dr. Laural Golden demonstrated NED.     Mantle cell lymphoma ([place])   08/27/2013 Initial Diagnosis    Colon, biopsy, random - MANTLE CELL LYMPHOMA      09/06/2013 Imaging    CT CAP- No significant lymphadenopathy identified within the chest, abdomen or pelvis.      09/14/2013 - 02/07/2014 Chemotherapy    Bendamustine/Rituxan x 6 cycles with Neulasta support      12/17/2013 Procedure    Colonoscopy with biopsy by Dr. Laural Golden.      12/17/2013 Pathology Results    Colon, biopsy, ascending and transverse - BENIGN APPEARING COLONIC MUCOSA WITH SCATTERED ATYPICAL LYMPHOCYTES.Overall, these findings are suspicious for minimal residual mantle cell lymphoma.      02/28/2014 Procedure    Colonoscopy with biopsy by Dr. Laural Golden      02/28/2014 Pathology Results    Colon, biopsy, proximal and distal - BENIGN COLONIC MUCOSA. - NO SIGNIFICANT INFLAMMATION OR OTHER ABNORMALITIES IDENTIFIED. - NO EVIDENCE OF LYMPHOMA, ADENOMATOUS CHANGES OR MALIGNANCY.      02/28/2014 Remission    No evidence of malignancy on colonosocpy and pathology from biopsy.      05/10/2014 - 04/01/2016 Chemotherapy    Maintenance Rituxan 500 mg/m x 2 years.      05/11/2015 Imaging    No definite findings to suggest metastatic disease in chest, abdomen or pelvis, new ground glass  atten in lungs B, nonspecific, repeat 6 to 12 mo      06/30/2015 - 07/02/2015 Hospital Admission    [facility]      08/04/2015 Procedure    Colonoscopy with biopsy by Dr. Laural Golden.      08/04/2015 Pathology Results    Colon, biopsy, right and left. - BENIGN COLORECTAL MUCOSA. - ASSOCIATED BENIGN LYMPHOID AGGREGATES. - NO EVIDENCE OF SIGNIFICANT INFLAMMATION, DYSPLASIA OR MALIGNANCY.      05/10/2016 Imaging    CT CAP- 1. No definite findings to suggest metastatic disease in the chest, abdomen or pelvis. There are several new patchy areas of ground-glass attenuation in the lungs bilaterally which are highly nonspecific. The possibility of developing interstitial lung disease is not excluded, and repeat high-resolution chest CT is suggested in 6-12 months to assess for temporal changes in the appearance of the lung parenchyma.       06/17/2016 Imaging    CT chest high resolution- 1. No convincing findings of interstitial lung disease. Minimal subpleural reticulation and ground-glass attenuation in the dependent lower lobes is not significantly changed since 05/11/2015, and appears slightly less prominent on the inspiratory sequence, suggesting a combination of mild hypoventilatory change and minimal nonspecific scarring. No traction bronchiectasis or frank honeycombing. 2. Two tiny solid pulmonary nodules in the right middle lobe and left lower lobe are stable and probably benign. 3. Left lower lobe ground-glass 11 mm nodule is stable. Given persistence, repeat CT is recommended every 2 years until 5 years of stability has been  established. This recommendation follows the consensus statement: Guidelines for Management of Incidental Pulmonary Nodules Detected on CT Images: From the Fleischner Society 2017; Radiology 2017; 284:228-243. 4. Additional findings include aortic atherosclerosis and coronary atherosclerosis.      08/19/2016 Imaging    CT abd/pelvis- 1. Tiny nonobstructive stones  in the right kidney. No cause for left flank pain identified. 2. Diverticulosis without focal diverticulitis. 3. Atherosclerosis. 4. Rounded density over the left lower chest on the scout view only, not seen on the June 17, 2016 CT is likely something on the patient's such as an EKG lead. Recommend clinical correlation. 5. Cholelithiasis.       He is doing well.  He reports a left thorax discomfort.  It has been ongoing for 1+ weeks.  He reports that certain movements increase pain and there is point tenderness.  He denies any trauma.  He reports an upcoming appointment with Dr. Laural Golden in the future.  Review of Systems  Constitutional: Positive for malaise/fatigue. Negative for chills, fever and weight loss.  HENT: Negative.   Eyes: Negative.   Respiratory: Positive for shortness of breath. Negative for cough, hemoptysis and sputum production.   Cardiovascular: Negative for chest pain, orthopnea and leg swelling.  Gastrointestinal: Negative.   Genitourinary: Negative.   Musculoskeletal: Negative.   Skin: Negative.   Neurological: Negative.   Endo/Heme/Allergies: Negative.   Psychiatric/Behavioral: Negative.     Past Medical History:  Diagnosis Date  . B12 deficiency 02/07/2014  . B12 deficiency 02/07/2014  . Colon cancer ([place])   . 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 Essential hypertension, benign   . GERD (gastroesophageal reflux disease)   . Mixed hyperlipidemia   . Obstructive sleep apnea    does not use  . Prostate cancer ([place])   . PVD (peripheral vascular disease) ([place])   . Type 2 diabetes mellitus ([place])     Past Surgical History:  Procedure Laterality Date  . BACK SURGERY    . BALLOON DILATION N/A 08/27/2013   Procedure: BALLOON DILATION;  Surgeon: Rogene Houston, MD;  [facility];  Service: Endoscopy;  Laterality: N/A;  . COLON SURGERY    .  COLONOSCOPY N/A 12/17/2013   Procedure: COLONOSCOPY;  Surgeon: Rogene Houston, MD;  [facility];  Service: Endoscopy;  Laterality: N/A;  940  . COLONOSCOPY N/A 02/28/2014   Procedure: COLONOSCOPY;  Surgeon: Rogene Houston, MD;  [facility];  Service: Endoscopy;  Laterality: N/A;  730  . COLONOSCOPY N/A 08/04/2015   Procedure: COLONOSCOPY;  Surgeon: Rogene Houston, MD;  [facility];  Service: Endoscopy;  Laterality: N/A;  200 - moved to 11/11 @ 2:10 - Ann to notify pt  . COLONOSCOPY WITH ESOPHAGOGASTRODUODENOSCOPY (EGD) N/A 08/27/2013   Procedure: COLONOSCOPY WITH ESOPHAGOGASTRODUODENOSCOPY (EGD);  Surgeon: Rogene Houston, MD;  [facility];  Service: Endoscopy;  Laterality: N/A;  730  . KNEE ARTHROSCOPY Left   . KNEE SURGERY Right    total knee  . MALONEY DILATION N/A 08/27/2013   Procedure: Venia Minks DILATION;  Surgeon: Rogene Houston, MD;  [facility];  Service: Endoscopy;  Laterality: N/A;  . PORTACATH PLACEMENT Right 2014  . PROSTATECTOMY    . removal of port Right   . SAVORY DILATION N/A 08/27/2013   Procedure: SAVORY DILATION;  Surgeon: Rogene Houston, MD;  [facility];  Service: Endoscopy;  Laterality: N/A;  . SHOULDER SURGERY    . TONSILLECTOMY    . VASECTOMY      Family History  Problem Relation Age of Onset  . Colon cancer Brother   . Cancer Brother   . Diabetes Father   . Cancer Brother     Social History   Social History  . Marital status: Married    Spouse name: [name]  . Number of children: 2  . Years of education: 8   Occupational History  . Retired  Retired   Social History Main Topics  . Smoking status: Never Smoker  . Smokeless tobacco: Never Used  . Alcohol use No  . Drug use: No  . Sexual activity: Yes    Birth control/ protection: None   Other Topics Concern  . None   Social History Narrative   Lives with wife   Caffeine use: none     PHYSICAL EXAMINATION  ECOG  PERFORMANCE STATUS: 1 - Symptomatic but completely ambulatory  Vitals:   09/05/16 1150  BP: 130/69  Pulse: (!) 56  Resp: 18  Temp: 98.5 F (36.9 C)    GENERAL:alert, no distress, well nourished, well developed, comfortable, cooperative, obese, smiling and unaccompanied SKIN: skin color, texture, turgor are normal, no rashes or significant lesions HEAD: Normocephalic, No masses, lesions, tenderness or abnormalities EYES: normal, EOMI, Conjunctiva are pink and non-injected EARS: External ears normal OROPHARYNX:lips, buccal mucosa, and tongue normal and mucous membranes are moist  NECK: supple, no adenopathy, trachea midline LYMPH:  no palpable lymphadenopathy, no hepatosplenomegaly BREAST:not examined LUNGS: clear to auscultation and percussion HEART: regular rate & rhythm, no murmurs, no gallops, S1 normal and S2 normal ABDOMEN:abdomen soft, non-tender, obese, normal bowel sounds and no masses or organomegaly BACK: Back symmetric, no curvature. EXTREMITIES:less then 2 second capillary refill, no joint deformities, effusion, or inflammation, no skin discoloration, no clubbing, no cyanosis  NEURO: alert & oriented x 3 with fluent speech, no focal motor/sensory deficits, gait normal   LABORATORY DATA: CBC    Component Value Date/Time   WBC 9.5 07/10/2016 1009   RBC 4.57 07/10/2016 1009   HGB 14.2 07/10/2016 1009   HCT 42.0 07/10/2016 1009   PLT 144.0 (L) 07/10/2016 1009   MCV 91.9 07/10/2016 1009   MCH 31.4 06/03/2016 1456   MCHC 33.8 07/10/2016 1009   RDW 16.1 (H) 07/10/2016 1009   LYMPHSABS 1.1 07/10/2016 1009   MONOABS 0.5 07/10/2016 1009   EOSABS 0.1 07/10/2016 1009   BASOSABS 0.0 07/10/2016 1009      Chemistry      Component Value Date/Time   NA 137 06/03/2016 1456   K 3.9 06/03/2016 1456   CL 102 06/03/2016 1456   CO2 28 06/03/2016 1456   BUN 16 06/03/2016 1456   CREATININE 1.21 06/03/2016 1456   CREATININE 1.17 11/28/2014 0955      Component Value Date/Time    CALCIUM 8.6 (L) 06/03/2016 1456   ALKPHOS 57 06/03/2016 1456   AST 22 06/03/2016 1456   ALT 18 06/03/2016 1456   BILITOT 0.6 06/03/2016 1456        PENDING LABS:   RADIOGRAPHIC STUDIES:  Ct Abdomen Pelvis Wo Contrast  Result Date: 08/19/2016 CLINICAL DATA:  Left flank pain with hematuria for 2 weeks. EXAM: CT ABDOMEN AND PELVIS WITHOUT CONTRAST TECHNIQUE: Multidetector CT imaging of the abdomen and pelvis was performed following the standard protocol without IV contrast. COMPARISON:  May 11, 2015 FINDINGS: Lower chest:  The scout view demonstrates a rounded density over the left lower chest. This was not present on the June 17, 2016 CT of the chest and is likely something on the patient. No other abnormalities in the upper abdomen. Hepatobiliary: Cholelithiasis is identified dependently in the gallbladder. The liver is otherwise unremarkable. Pancreas: The tail of the pancreas is largely fatty replaced and atrophic. The remainder of the pancreas is unremarkable. Spleen: Normal in size without focal abnormality. Adrenals/Urinary Tract: The tiny left adrenal nodule is unchanged, likely a small adenoma but too small to characterize. The right adrenal gland demonstrates another probable tiny adenoma. Bilateral renal cysts are seen with no suspicious masses. Probable tiny stones in the right kidney identified on coronal image 66 measuring 2 or 3 mm. No hydronephrosis or acute perinephric stranding. No ureterectasis or ureteral Homann. The bladder is unremarkable. Stomach/Bowel: The stomach and small bowel are normal. The colon demonstrates diverticulosis without focal diverticulitis colon is otherwise unremarkable. The appendix demonstrates no evidence appendicitis. Vascular/Lymphatic: Atherosclerotic changes are seen within the non aneurysmal abdominal aorta, iliac vessels common femoral vessels. No adenopathy. Reproductive: A penile implant is identified. The prostate appears to be surgically  absent. Other: No other abnormalities. Musculoskeletal: A sclerotic focus in the right posterior iliac bone on series 4, image 85 is unchanged, likely a bone island. Degenerative changes seen in the spine. IMPRESSION: 1. Tiny nonobstructive stones in the right kidney. No cause for left flank pain identified. 2. Diverticulosis without focal diverticulitis. 3. Atherosclerosis. 4. Rounded density over the left lower chest on the scout view only, not seen on the June 17, 2016 CT is likely something on the patient's such as an EKG lead. Recommend clinical correlation. 5. Cholelithiasis. Electronically Signed   By: Dorise Bullion III M.D   On: 08/19/2016 15:44     PATHOLOGY:    ASSESSMENT AND PLAN:  Mantle cell lymphoma ([place]) Mantle Cell lymphoma of the large intestine diagnosed 08/2013, S/P BR x 6 cycles from 09/14/2013- 02/07/2014, followed by maintenance Rituxan 05/10/2014- 04/01/2016. Colonoscopy on 02/28/2014 by Dr. Laural Golden demonstrated NED.  Imaging in 2017 was also negative for any recurrent disease.  Oncology history updated.  Labs today: CBC diff, CMET, LDH.  I personally reviewed and went over laboratory results with the patient.  The results are noted within this dictation.  He requested a PSA test, per his urologist.  We will get him a copy of this result.  He is following with Dr. Jaynee Eagles for his essential tremor and Dr. Melvyn Novas for his pulmonary issues.  Notes are reviewed in detail.  He reports a left sided, thorax pain that overlies a lower rib.  Symptomatic management is recommended.  He will need port flushes every 6 weeks.  Return in 3 months for follow-up.  B12 deficiency B12 deficiency, on B12 IM replacement therapy.  B12 injection today.   ORDERS PLACED FOR THIS ENCOUNTER: Orders Placed This Encounter  Procedures  . CBC with Differential  . Comprehensive metabolic panel  . Lactate dehydrogenase  . CBC with Differential  . Comprehensive metabolic panel  . Lactate  dehydrogenase  . PSA    MEDICATIONS PRESCRIBED THIS ENCOUNTER: No orders of the defined types were placed in this encounter.   THERAPY PLAN:  Surveillance for Mantle Cell Lymphoma following complete response to therapy per NCCN guidelines (4.2017):  A. Clinical follow-up every 3-6 months for 5 years and then annually, or as clinically indicated.  All questions were answered. The patient knows to call the clinic  with any problems, questions or concerns. We can certainly see the patient much sooner if necessary.  Patient and plan discussed with Dr. Ancil Linsey and she is in agreement with the aforementioned.   This note is electronically signed by: Doy Mince 09/05/2016 12:22 PM

## 2016-09-05 NOTE — Assessment & Plan Note (Addendum)
Mantle Cell lymphoma of the large intestine diagnosed 08/2013, S/P BR x 6 cycles from 09/14/2013- 02/07/2014, followed by maintenance Rituxan 05/10/2014- 04/01/2016. Colonoscopy on 02/28/2014 by Dr. Laural Golden demonstrated NED.  Imaging in 2017 was also negative for any recurrent disease.  Oncology history updated.  Labs today: CBC diff, CMET, LDH.  I personally reviewed and went over laboratory results with the patient.  The results are noted within this dictation.  He requested a PSA test, per his urologist.  We will get him a copy of this result.  He is following with Dr. Jaynee Eagles for his essential tremor and Dr. Melvyn Novas for his pulmonary issues.  Notes are reviewed in detail.  He reports a left sided, thorax pain that overlies a lower rib.  Symptomatic management is recommended.  He will need port flushes every 6 weeks.  Return in 3 months for follow-up.

## 2016-09-05 NOTE — Assessment & Plan Note (Signed)
B12 deficiency, on B12 IM replacement therapy.  B12 injection today. 

## 2016-09-05 NOTE — Progress Notes (Signed)
[name] presented for Portacath access and flush. Portacath located right chest wall accessed with  H 20 needle.  Good blood return present. Portacath flushed with 62m NS and 500U/556mHeparin and needle removed intact.  Procedure tolerated well and without incident.  NaEarnest Conroyresents today for injection per the provider's orders.  B12 administration without incident; see MAR for injection details.  Patient tolerated procedure well and without incident.  No questions or complaints noted at this time.

## 2016-09-06 DIAGNOSIS — C8310 Mantle cell lymphoma, unspecified site: Secondary | ICD-10-CM | POA: Diagnosis not present

## 2016-09-06 LAB — CBC WITH DIFFERENTIAL/PLATELET
Basophils Absolute: 0 10*3/uL (ref 0.0–0.1)
Basophils Relative: 0 %
Eosinophils Absolute: 0.1 10*3/uL (ref 0.0–0.7)
Eosinophils Relative: 1 %
HEMATOCRIT: 49 % (ref 39.0–52.0)
HEMOGLOBIN: 15.2 g/dL (ref 13.0–17.0)
LYMPHS PCT: 15 %
Lymphs Abs: 1.1 10*3/uL (ref 0.7–4.0)
MCH: 30.1 pg (ref 26.0–34.0)
MCHC: 31 g/dL (ref 30.0–36.0)
MCV: 97 fL (ref 78.0–100.0)
MONOS PCT: 4 %
Monocytes Absolute: 0.3 10*3/uL (ref 0.1–1.0)
NEUTROS ABS: 5.8 10*3/uL (ref 1.7–7.7)
NEUTROS PCT: 80 %
Platelets: 126 10*3/uL — ABNORMAL LOW (ref 150–400)
RBC: 5.05 MIL/uL (ref 4.22–5.81)
RDW: 15.8 % — AB (ref 11.5–15.5)
WBC: 7.4 10*3/uL (ref 4.0–10.5)

## 2016-09-06 LAB — COMPREHENSIVE METABOLIC PANEL

## 2016-09-06 NOTE — Addendum Note (Signed)
Addended by: Donnie Aho on: 09/06/2016 10:41 AM   Modules accepted: Orders

## 2016-09-08 NOTE — Progress Notes (Signed)
CARDIOLOGY OFFICE NOTE  Date:  09/09/2016    [name] Date of Birth: 05-23-1935 Medical Record #[id number]  PCP:  Wende Neighbors, MD  Cardiologist:  Domenic Polite  Chief Complaint  Patient presents with  . Hypertension    3 month check. Seen for Dr. Domenic Polite    History of Present Illness: [name] is a 81 y.o. male who presents today for a 3 month check. Former patient of Dr. Maren Beach. Last seen in May of 2014 by Dr. Domenic Polite.  He has known CAD with no prior history of PCI. Other issues include colon cancer, HTN, GERD, HLD, PVD and OSA.  Remote stress test from 2008.  Reported echocardiogram from April 2011 noted upper normal LV wall thickness with LVEF 32-44%, grade 2 diastolic dysfunction, trivial aortic regurgitation, mild biatrial enlargement.  I saw him 3 months ago - he was needing left hand surgery and needing clearance. He had not been back here "because nothing is wrong with me". He was off most of his medicines other than Inderal and his med list did not match his PCP's. BP high here and at home. No symptoms. I put him on Norvasc - I was not convinced that he would stay on long term.   Comes in today. Here alone. His history is a little hard to follow. Tells me his hand surgery went well - but then he had to stay overnight because "he was not breathing well". Pretty vauge on the details. He tells me that his shortness of breath is unchanged. He has had chronic DOE.  No chest pain. Saw Dr. Melvyn Novas - mentioned getting an echo - this was not done - has been treated for pneumonia. No real exercise. Some cough.  He is on PPI - first told me he was not taking - then says he is. Asking for something for nausea - not clear why. He has lost weight - he is not trying at all and does not seem aware. Down 13 pounds since last seen by me. He brings in labs for my review. CBC ok. Worsening CKD noted. Not on statin due to cost - says he might go on it in the new year.  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 Last colonoscopy was in June of 2015. His CT of the abdomen from November shoed gallstones and a rounded density over the left lower chest on the scout view - this was not seen on on the CT from September. He reported having left sided thorax pain at his last oncology visit - still having. He is not aware of his CT of the abdomen findings.   Past Medical History:  Diagnosis Date  . B12 deficiency 02/07/2014  . B12 deficiency 02/07/2014  . Colon cancer ([place])   . 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 Essential hypertension, benign   . GERD (gastroesophageal reflux disease)   . Mixed hyperlipidemia   . Obstructive sleep apnea    does not use  . Prostate cancer ([place])   . PVD (peripheral vascular disease) ([place])   . Type 2 diabetes mellitus ([place])     Past Surgical History:  Procedure Laterality Date  . BACK SURGERY    . BALLOON DILATION N/A 08/27/2013   Procedure: BALLOON DILATION;  Surgeon: Rogene Houston, MD;  [facility];  Service: Endoscopy;  Laterality: N/A;  . COLON SURGERY    . COLONOSCOPY N/A 12/17/2013   Procedure: COLONOSCOPY;  Surgeon: Rogene Houston, MD;  [facility];  Service: Endoscopy;  Laterality: N/A;  940  . COLONOSCOPY N/A 02/28/2014   Procedure: COLONOSCOPY;  Surgeon: Rogene Houston, MD;  [facility];  Service: Endoscopy;  Laterality: N/A;  730  . COLONOSCOPY N/A 08/04/2015   Procedure: COLONOSCOPY;  Surgeon: Rogene Houston, MD;  [facility];  Service: Endoscopy;  Laterality: N/A;  200 - moved to 11/11 @ 2:10 - Ann to notify pt  . COLONOSCOPY WITH ESOPHAGOGASTRODUODENOSCOPY (EGD) N/A 08/27/2013   Procedure: COLONOSCOPY WITH ESOPHAGOGASTRODUODENOSCOPY (EGD);  Surgeon: Rogene Houston, MD;  [facility];  Service: Endoscopy;   Laterality: N/A;  730  . KNEE ARTHROSCOPY Left   . KNEE SURGERY Right    total knee  . MALONEY DILATION N/A 08/27/2013   Procedure: Venia Minks DILATION;  Surgeon: Rogene Houston, MD;  [facility];  Service: Endoscopy;  Laterality: N/A;  . PORTACATH PLACEMENT Right 2014  . PROSTATECTOMY    . removal of port Right   . SAVORY DILATION N/A 08/27/2013   Procedure: SAVORY DILATION;  Surgeon: Rogene Houston, MD;  [facility];  Service: Endoscopy;  Laterality: N/A;  . SHOULDER SURGERY    . TONSILLECTOMY    . VASECTOMY       Medications: Current Outpatient Prescriptions  Medication Sig Dispense Refill  . acetaminophen (TYLENOL) 325 MG tablet Take 650 mg by mouth every 4 (four) hours as needed for mild pain or moderate pain. Take 2 tablets 1 hour prior to Rituxan treatment.    [name] albuterol (PROAIR HFA) 108 (90 Base) MCG/ACT inhaler Inhale 2 puffs into the lungs every 6 (six) hours as needed for wheezing or shortness of breath.    [name] amLODipine (NORVASC) 5 MG tablet Take 1 tablet (5 mg total) by mouth daily. 30 tablet 6  . budesonide-formoterol (SYMBICORT) 80-4.5 MCG/ACT inhaler Inhale 2 puffs into the lungs 2 (two) times daily. 1 Inhaler 0  . glipiZIDE (GLUCOTROL) 10 MG tablet Take 10 mg by mouth daily before breakfast.     . pantoprazole (PROTONIX) 40 MG tablet Take 1 tablet (40 mg total) by mouth 2 (two) times daily before a meal. 60 tablet 5  . propranolol (INDERAL) 20 MG tablet Take 1 tablet (20 mg total) by mouth 3 (three) times daily. 120 tablet 12  . testosterone cypionate (DEPOTESTOSTERONE CYPIONATE) 200 MG/ML injection Inject 200 mg into the muscle every 14 (fourteen) days.     Nelva Nay SOLOSTAR 300 UNIT/ML SOPN Inject 50 Units into the skin every morning.      No current facility-administered medications for this visit.    Facility-Administered Medications Ordered in Other Visits  Medication Dose Route Frequency Provider Last Rate Last Dose  . heparin lock flush  100 unit/mL  500 Units Intravenous Once Baird Cancer, PA-C      . sodium chloride flush (NS) 0.9 % injection 20 mL  20 mL Intravenous PRN Baird Cancer, PA-C        Allergies: Allergies  Allergen Reactions  . Bee Venom Anaphylaxis  . Adhesive [Tape] Hives  . Ciprofloxacin     Unknown   . Codeine     Unknown    . Iodine     Unknown    . Latex  Unknown    . Povidone-Iodine     Unknown    . Simvastatin     Unknown      Social History: The patient  reports that he has never smoked. He has never used smokeless tobacco. He reports that he does not drink alcohol or use drugs.   Family History: The patient's family history includes Cancer in his brother and brother; Colon cancer in his brother; Diabetes in his father.   Review of Systems: Please see the history of present illness.   Otherwise, the review of systems is positive for none.   All other systems are reviewed and negative.   Physical Exam: VS:  BP 112/76   Pulse 63   Ht '5\' 11"'$  (1.803 m)   Wt 234 lb 12.8 oz (106.5 kg)   SpO2 95% Comment: at rest  BMI 32.75 kg/m  .  BMI Body mass index is 32.75 kg/m.  Wt Readings from Last 3 Encounters:  09/09/16 234 lb 12.8 oz (106.5 kg)  09/05/16 234 lb (106.1 kg)  07/30/16 244 lb (110.7 kg)    General: He is alert and in no acute distress. Little gruff at times. He has lost 13 pounds since last seen here.   HEENT: Normal.  Neck: Supple, no JVD, carotid bruits, or masses noted.  Cardiac: Regular rate and rhythm. No murmurs, rubs, or gallops. No edema.  Respiratory:  Lungs are coarse - few wheezes bilaterally with normal work of breathing.  GI: Soft and nontender. He has left sided pain with palpation over the left flank. MS: No deformity or atrophy. Gait and ROM intact.  Skin: Warm and dry. Color is normal.  Neuro:  Strength and sensation are intact and no gross focal deficits noted.  Psych: Alert, appropriate and with normal affect.   LABORATORY  DATA:  EKG:  EKG is not ordered today.  Lab Results  Component Value Date   WBC 7.4 09/06/2016   HGB 15.2 09/06/2016   HCT 49.0 09/06/2016   PLT 126 (L) 09/06/2016   GLUCOSE 188 (H) 06/03/2016   CHOL 195 05/14/2010   TRIG 115.0 05/14/2010   HDL 55.40 05/14/2010   LDLCALC 117 (H) 05/14/2010   ALT 18 06/03/2016   AST 22 06/03/2016   NA 137 06/03/2016   K 3.9 06/03/2016   CL 102 06/03/2016   CREATININE 1.21 06/03/2016   BUN 16 06/03/2016   CO2 28 06/03/2016   TSH 2.030 10/17/2015   PSA <0.01 06/30/2015   INR 1.22 12/31/2013    BNP (last 3 results) No results for input(s): BNP in the last 8760 hours.  ProBNP (last 3 results)  Recent Labs  07/10/16 1009  PROBNP 226.0*     Other Studies Reviewed Today:   Assessment/Plan: 1. CAD - managed with CV risk factor modification - no active symptoms noted. Unchanged DOE.   2. HTN - started on Norvasc at his last visit - BP has improved. I have left him on his current regimen.   3. HLD - not on therapy - may resume after the first of the year due to cost.    4. Prior Medication noncompliance - not really clear to me as to the etiology.   5.Left flank pain/nausea - abnormal Ct of the abdomen - will arrange CT chest. Remains on PPI. History of lymphoma of the large intestine.   6. Weight loss - concerning - arranging for CT of the chest - may need to get back to oncology.  Current medicines are reviewed with the patient today.  The patient does not have concerns regarding medicines other than what has been noted above.  The following changes have been made:  See above.  Labs/ tests ordered today include:    Orders Placed This Encounter  Procedures  . CT Chest Wo Contrast     Disposition:   His cardiac status is felt to be stable. His other issues are worrisome. I will see him back in 6 months.   Patient is agreeable to this plan and will call if any problems develop in the interim.   Signed: Burtis Junes, RN, ANP-C 09/09/2016 8:52 AM  [facility] [address] [place], [place]  [address] Phone: [phone] Fax: [phone]

## 2016-09-09 ENCOUNTER — Ambulatory Visit (INDEPENDENT_AMBULATORY_CARE_PROVIDER_SITE_OTHER)
Admission: RE | Admit: 2016-09-09 | Discharge: 2016-09-09 | Disposition: A | Discharge status: Home / Self Care | Payer: Medicare Other | Source: Ambulatory Visit | Attending: Nurse Practitioner | Admitting: Nurse Practitioner

## 2016-09-09 ENCOUNTER — Encounter: Payer: Self-pay | Admitting: Nurse Practitioner

## 2016-09-09 ENCOUNTER — Ambulatory Visit (INDEPENDENT_AMBULATORY_CARE_PROVIDER_SITE_OTHER): Payer: Medicare Other | Admitting: Nurse Practitioner

## 2016-09-09 VITALS — BP 112/76 | HR 63 | Ht 71.0 in | Wt 234.8 lb

## 2016-09-09 DIAGNOSIS — R918 Other nonspecific abnormal finding of lung field: Secondary | ICD-10-CM | POA: Diagnosis not present

## 2016-09-09 DIAGNOSIS — C8310 Mantle cell lymphoma, unspecified site: Secondary | ICD-10-CM

## 2016-09-09 DIAGNOSIS — I259 Chronic ischemic heart disease, unspecified: Secondary | ICD-10-CM | POA: Diagnosis not present

## 2016-09-09 DIAGNOSIS — I10 Essential (primary) hypertension: Secondary | ICD-10-CM

## 2016-09-09 DIAGNOSIS — E7800 Pure hypercholesterolemia, unspecified: Secondary | ICD-10-CM

## 2016-09-09 NOTE — Patient Instructions (Addendum)
We will be checking the following labs today - NONE   Medication Instructions:    Continue with your current medicines.     Testing/Procedures To Be Arranged:  CT chest without contrast  Follow-Up:   See me in 6 months    Other Special Instructions:   N/A    If you need a refill on your cardiac medications before your next appointment, please call your pharmacy.   Call the [place] office at [phone] if you have any questions, problems or concerns.

## 2016-09-10 ENCOUNTER — Ambulatory Visit: Payer: Medicare Other | Admitting: Nurse Practitioner

## 2016-09-10 NOTE — Telephone Encounter (Signed)
A message was sent to Reba to find a spot for this patient to come in.  He has been given an appointment 09/19/16 at 3:30pm and the patient has been advised.

## 2016-09-11 DIAGNOSIS — J019 Acute sinusitis, unspecified: Secondary | ICD-10-CM | POA: Diagnosis not present

## 2016-09-19 ENCOUNTER — Encounter (INDEPENDENT_AMBULATORY_CARE_PROVIDER_SITE_OTHER): Payer: Self-pay | Admitting: *Deleted

## 2016-09-19 ENCOUNTER — Encounter (INDEPENDENT_AMBULATORY_CARE_PROVIDER_SITE_OTHER): Payer: Self-pay | Admitting: Internal Medicine

## 2016-09-19 ENCOUNTER — Ambulatory Visit (INDEPENDENT_AMBULATORY_CARE_PROVIDER_SITE_OTHER): Payer: Medicare Other | Admitting: Internal Medicine

## 2016-09-19 VITALS — BP 110/74 | HR 70 | Temp 97.5°F | Resp 18 | Ht 71.0 in | Wt 231.1 lb

## 2016-09-19 DIAGNOSIS — G8929 Other chronic pain: Secondary | ICD-10-CM

## 2016-09-19 DIAGNOSIS — R112 Nausea with vomiting, unspecified: Secondary | ICD-10-CM

## 2016-09-19 DIAGNOSIS — M545 Low back pain, unspecified: Secondary | ICD-10-CM

## 2016-09-19 DIAGNOSIS — K8020 Calculus of gallbladder without cholecystitis without obstruction: Secondary | ICD-10-CM | POA: Diagnosis not present

## 2016-09-19 DIAGNOSIS — I259 Chronic ischemic heart disease, unspecified: Secondary | ICD-10-CM

## 2016-09-19 MED ORDER — ONDANSETRON HCL 4 MG PO TABS: 4.0000 mg | ORAL_TABLET | Freq: Three times a day (TID) | ORAL | 1 refills | Status: DC | PRN

## 2016-09-19 NOTE — Patient Instructions (Signed)
Solid-phase gastric emptying study to be scheduled. Please check with Dr. Nevada Crane if you can take Lyrica on schedule rather than when necessary.

## 2016-09-19 NOTE — Progress Notes (Signed)
Presenting complaint;  Nausea and vomiting. Left sided abdominal and back pain  Database and Subjective:  Patient is 81 year old Caucasian male who has chronic GERD history of ulcerative colitis(in remission) and history of mantle cell lymphoma of the colon(in remission; last colonoscopy November 2016). He is been off mesalamine for few years. He was last seen in the office in September 2016 and was doing well.  He presents with multiple complaints. He states heartburn is well controlled with PPI but he continues to have frequent nausea and intermittent vomiting. These symptoms have been going on for several weeks. Last week he vomited multiple times in one day. She is unable to eat. He states he weighed 248 pounds 3 months ago. Today he is 231 pounds. He denies hematemesis melena or rectal bleeding. He also complains of pain under the left costal margin and it radiates into his back. This pain is not necessarily associated with his nausea and vomiting. He has cholelithiasis and wonders if some of his symptoms are due to gallstones. He was found to have gallstones about 9 years ago on CT scan.. He has 2 formed stools daily. He has occasional diarrhea like this morning. Also complains of pain on the left side of his back from the level of her lower rib cage down to his hip. This pain is more or less constant. Pain is worse when he lies flat in bed. This pain does not radiate into his lower extremities. He states he has tried samples of Lyrica given to him by Dr. Nevada Crane and it seemed to help this pain. Months ago he was treated for pneumonia. Had follow-up chest CT about 10 days ago and there are no concerning changes. He states in the last 3 months he has had 2 chest CTs and 1 abdominopelvic CT. He is presently not having any breathing problems. He is still having problems with urinary incontinence and is seeing a urologist at [facility].    Current Medications: Outpatient Encounter Prescriptions as of  09/19/2016  Medication Sig  . acetaminophen (TYLENOL) 325 MG tablet Take 650 mg by mouth every 4 (four) hours as needed for mild pain or moderate pain. Take 2 tablets 1 hour prior to Rituxan treatment.  [name] albuterol (PROAIR HFA) 108 (90 Base) MCG/ACT inhaler Inhale 2 puffs into the lungs at bedtime as needed for wheezing or shortness of breath.   [name] amLODipine (NORVASC) 5 MG tablet Take 1 tablet (5 mg total) by mouth daily.  [name] amoxicillin-clavulanate (AUGMENTIN) 875-125 MG tablet Take 1 tablet by mouth 2 (two) times daily.   . budesonide-formoterol (SYMBICORT) 80-4.5 MCG/ACT inhaler Inhale 2 puffs into the lungs 2 (two) times daily.  [name] glipiZIDE (GLUCOTROL) 10 MG tablet Take 10 mg by mouth daily before breakfast.   . pantoprazole (PROTONIX) 40 MG tablet Take 1 tablet (40 mg total) by mouth 2 (two) times daily before a meal.  . propranolol (INDERAL) 20 MG tablet Take 1 tablet (20 mg total) by mouth 3 (three) times daily.  [name] testosterone cypionate (DEPOTESTOSTERONE CYPIONATE) 200 MG/ML injection Inject 200 mg into the muscle every 14 (fourteen) days.   Nelva Nay SOLOSTAR 300 UNIT/ML SOPN Inject 55 Units into the skin every morning.    Facility-Administered Encounter Medications as of 09/19/2016  Medication  . heparin lock flush 100 unit/mL  . sodium chloride flush (NS) 0.9 % injection 20 mL     Objective: Blood pressure 110/74, pulse 70, temperature 97.5 F (36.4 C), temperature source Oral, resp. rate 18, height  5' 11"  (1.803 m), weight 231 lb 1.6 oz (104.8 kg). Patient is alert and in no acute distress. Conjunctiva is pink. Sclera is nonicteric Oropharyngeal mucosa is normal. No neck masses or thyromegaly noted. Cardiac exam with regular rhythm normal S1 and S2. No murmur or gallop noted. Lungs are clear to auscultation. Abdomen is symmetrical. Bowel sounds are normal. No bruits noted. On palpation abdomen is soft with mild tenderness at LLQ but no organomegaly or masses. Examination of  upper and lower back reveals no anatomic abnormality. He has midline scar in lower lumbar region. He has increased sponsor tactile sensation. No LE edema or clubbing noted.  Labs/studies Results:   abdominopelvic CT from 08/19/2016 reviewed along with patient CT from 09/09/2016 also reviewed.  Lab data from 09/06/2016 CBC 7.4, H&H 15.2 and 49.0 and platelet count 126K.  Assessment:  #1. Nausea and vomiting. Patient is having frequent nausea but sporadic vomiting. He possibly has gastroparesis. He had EGD in December 24 14 was negative for peptic ulcer disease. #2. Left quadrant abdominal pain most likely's related to his back problems. I do not believe this pain is referred pain due to gallstones. #3. Left-sided back pain appears to be neuropathic or musculoskeletal pain. The fact that Lyrica helps his pain supports diagnosis of neuropathic pain and it may be related to his back. He could have diabetic neuropathy. Further evaluation and treatment per Dr. Edwyna Ready call. #4. History of ulcerative colitis and mental cell lymphoma. Last colonoscopy was 13 months ago and he is in remission. He has mild tenderness at LLQ which she has had chronically possibly do to IBS but he does not have other typical symptoms of IBS.   Plan:  H. pylori serology. Ondansetron 4 mg 3 times a day when necessary Solid-phase gastric emptying study. If gastric emptying study is normal will proceed with either scan with [facility]. Patient will talk with Dr. Wende Neighbors if he could go back on Lyrica as it seems to be helping with his back pain. Office visit in 3 months.

## 2016-09-27 ENCOUNTER — Encounter (HOSPITAL_COMMUNITY)
Admission: RE | Admit: 2016-09-27 | Discharge: 2016-09-27 | Disposition: A | Discharge status: Home / Self Care | Payer: Medicare Other | Source: Ambulatory Visit | Attending: Internal Medicine | Admitting: Internal Medicine

## 2016-09-27 ENCOUNTER — Other Ambulatory Visit (HOSPITAL_COMMUNITY)
Admission: RE | Admit: 2016-09-27 | Discharge: 2016-09-27 | Disposition: A | Discharge status: Home / Self Care | Payer: Medicare Other | Source: Ambulatory Visit | Attending: Internal Medicine | Admitting: Internal Medicine

## 2016-09-27 ENCOUNTER — Encounter (HOSPITAL_COMMUNITY): Payer: Self-pay

## 2016-09-27 DIAGNOSIS — R112 Nausea with vomiting, unspecified: Secondary | ICD-10-CM | POA: Insufficient documentation

## 2016-09-27 DIAGNOSIS — E119 Type 2 diabetes mellitus without complications: Secondary | ICD-10-CM | POA: Diagnosis not present

## 2016-09-27 MED ORDER — TECHNETIUM TC 99M SULFUR COLLOID
2.0000 | Freq: Once | INTRAVENOUS | Status: AC | PRN
  Administered 2016-09-27: 2.2 via ORAL

## 2016-09-28 LAB — H. PYLORI ANTIBODY, IGG: H Pylori IgG: <0.9 U/mL (ref 0.0–0.8)

## 2016-09-30 DIAGNOSIS — E291 Testicular hypofunction: Secondary | ICD-10-CM | POA: Diagnosis not present

## 2016-10-01 ENCOUNTER — Encounter (INDEPENDENT_AMBULATORY_CARE_PROVIDER_SITE_OTHER): Payer: Self-pay

## 2016-10-07 ENCOUNTER — Encounter (HOSPITAL_COMMUNITY): Payer: Medicare Other | Attending: Hematology and Oncology

## 2016-10-07 VITALS — BP 137/71 | HR 67 | Temp 97.5°F | Resp 16

## 2016-10-07 DIAGNOSIS — E538 Deficiency of other specified B group vitamins: Secondary | ICD-10-CM

## 2016-10-07 DIAGNOSIS — C8319 Mantle cell lymphoma, extranodal and solid organ sites: Secondary | ICD-10-CM

## 2016-10-07 DIAGNOSIS — C8310 Mantle cell lymphoma, unspecified site: Secondary | ICD-10-CM | POA: Insufficient documentation

## 2016-10-07 MED ORDER — CYANOCOBALAMIN 1000 MCG/ML IJ SOLN
INTRAMUSCULAR | Status: AC
  Filled 2016-10-07: qty 1

## 2016-10-07 MED ORDER — CYANOCOBALAMIN 1000 MCG/ML IJ SOLN
1000.0000 ug | Freq: Once | INTRAMUSCULAR | Status: AC
  Administered 2016-10-07: 1000 ug via INTRAMUSCULAR

## 2016-10-07 NOTE — Patient Instructions (Signed)
[place] at [facility] Discharge Instructions  RECOMMENDATIONS MADE BY THE CONSULTANT AND ANY TEST RESULTS WILL BE SENT TO YOUR REFERRING PHYSICIAN.  You had B12 injection today. Call cancer center for any concerns.  Thank you for choosing [place] at [name] to provide your oncology and hematology care.  To afford each patient quality time with our provider, please arrive at least 15 minutes before your scheduled appointment time.    If you have a lab appointment with the [facility] please come in thru the  Main Entrance and check in at the main information desk  You need to re-schedule your appointment should you arrive 10 or more minutes late.  We strive to give you quality time with our providers, and arriving late affects you and other patients whose appointments are after yours.  Also, if you no show three or more times for appointments you may be dismissed from the clinic at the providers discretion.     Again, thank you for choosing [facility].  Our hope is that these requests will decrease the amount of time that you wait before being seen by our physicians.       _____________________________________________________________  Should you have questions after your visit to [facility], please contact our office at [phone] between the hours of 8:30 a.m. and 4:30 p.m.  Voicemails left after 4:30 p.m. will not be returned until the following business day.  For prescription refill requests, have your pharmacy contact our office.       Resources For Cancer Patients and their Caregivers ? American Cancer Society: Can assist with transportation, wigs, general needs, runs Look Good Feel Better.        2894076742 ? Cancer Care: Provides financial assistance, online support groups, medication/co-pay assistance.  1-800-813-HOPE (787)838-4253) ? [place] Assists [place]  cancer patients and their families through emotional , educational and financial support.  (701)088-3722 ? [place] DSS Where to apply for food stamps, Medicaid and utility assistance. (763)329-1698 ? RCATS: Transportation to medical appointments. (416)198-6860 ? Social Security Administration: May apply for disability if have a Stage IV cancer. 432-855-0121 804-501-3721 ? [employer], Disability and Transit Services: Assists with nutrition, care and transit needs. [place] Support Programs: '@10RELATIVEDAYS'$ @ > Cancer Support Group  2nd Tuesday of the month 1pm-2pm, Journey Room  > Creative Journey  3rd Tuesday of the month 1130am-1pm, Journey Room  > Look Good Feel Better  1st Wednesday of the month 10am-12 noon, Journey Room (Call [place] to register [phone])

## 2016-10-07 NOTE — Progress Notes (Signed)
[name] presents today for injection per MD orders. B12  administered IM in right Upper Arm. Administration without incident. Patient tolerated well and left ambulatory in stable condition by himself.

## 2016-10-11 DIAGNOSIS — E1142 Type 2 diabetes mellitus with diabetic polyneuropathy: Secondary | ICD-10-CM | POA: Diagnosis not present

## 2016-10-11 DIAGNOSIS — B351 Tinea unguium: Secondary | ICD-10-CM | POA: Diagnosis not present

## 2016-10-14 ENCOUNTER — Telehealth (INDEPENDENT_AMBULATORY_CARE_PROVIDER_SITE_OTHER): Payer: Self-pay | Admitting: Internal Medicine

## 2016-10-14 DIAGNOSIS — E291 Testicular hypofunction: Secondary | ICD-10-CM | POA: Diagnosis not present

## 2016-10-14 NOTE — Telephone Encounter (Signed)
Patient called, stated that Dr. Laural Golden told him to call this past Friday with a progress report, but he couldn't get anyone.  I called the patient back, I did ask him what time he called on Friday, he called after 4pm.  I explained that we close early on Friday's.  He stated that Dr. Laural Golden had asked him to stop some of his medication and since then, his stomach pain is not as bad, he stated that it has eased up a little.  [phone]

## 2016-10-14 NOTE — Telephone Encounter (Signed)
Dr.Rehman will be given his progress report. Any changes/recommendations patient will be notified.

## 2016-10-15 NOTE — Telephone Encounter (Signed)
Dr.Rehman was made aware. 

## 2016-10-18 ENCOUNTER — Encounter (HOSPITAL_BASED_OUTPATIENT_CLINIC_OR_DEPARTMENT_OTHER): Payer: Medicare Other

## 2016-10-18 VITALS — BP 133/75 | HR 71 | Temp 97.6°F | Resp 18

## 2016-10-18 DIAGNOSIS — Z452 Encounter for adjustment and management of vascular access device: Secondary | ICD-10-CM

## 2016-10-18 DIAGNOSIS — Z95828 Presence of other vascular implants and grafts: Secondary | ICD-10-CM

## 2016-10-18 DIAGNOSIS — C8310 Mantle cell lymphoma, unspecified site: Secondary | ICD-10-CM | POA: Diagnosis not present

## 2016-10-18 MED ORDER — HEPARIN SOD (PORK) LOCK FLUSH 100 UNIT/ML IV SOLN
500.0000 [IU] | Freq: Once | INTRAVENOUS | Status: AC
  Administered 2016-10-18: 500 [IU] via INTRAVENOUS
  Filled 2016-10-18: qty 5

## 2016-10-18 MED ORDER — SODIUM CHLORIDE 0.9% FLUSH
10.0000 mL | Freq: Once | INTRAVENOUS | Status: AC
  Administered 2016-10-18: 10 mL via INTRAVENOUS

## 2016-10-18 NOTE — Patient Instructions (Signed)
[place] at [facility] Discharge Instructions  RECOMMENDATIONS MADE BY THE CONSULTANT AND ANY TEST RESULTS WILL BE SENT TO YOUR REFERRING PHYSICIAN.  Port flush today as scheduled. Return as scheduled.  Thank you for choosing [place] at [facility] to provide your oncology and hematology care.  To afford each patient quality time with our provider, please arrive at least 15 minutes before your scheduled appointment time.    If you have a lab appointment with the [facility] please come in thru the  Main Entrance and check in at the main information desk  You need to re-schedule your appointment should you arrive 10 or more minutes late.  We strive to give you quality time with our providers, and arriving late affects you and other patients whose appointments are after yours.  Also, if you no show three or more times for appointments you may be dismissed from the clinic at the providers discretion.     Again, thank you for choosing [facility]  Our hope is that these requests will decrease the amount of time that you wait before being seen by our physicians.       _____________________________________________________________  Should you have questions after your visit to [facility], please contact our office at [phone] between the hours of 8:30 a.m. and 4:30 p.m.  Voicemails left after 4:30 p.m. will not be returned until the following business day.  For prescription refill requests, have your pharmacy contact our office.       Resources For Cancer Patients and their Caregivers ? American Cancer Society: Can assist with transportation, wigs, general needs, runs Look Good Feel Better.        205-644-4829 ? Cancer Care: Provides financial assistance, online support groups, medication/co-pay assistance.  1-800-813-HOPE 4194883684) ? [place] Assists [place] cancer patients  and their families through emotional , educational and financial support.  236-674-3343 ? [place] DSS Where to apply for food stamps, Medicaid and utility assistance. 408-477-7702 ? RCATS: Transportation to medical appointments. 587-547-8794 ? Social Security Administration: May apply for disability if have a Stage IV cancer. 718-583-8632 (913) 865-0581 ? [employer], Disability and Transit Services: Assists with nutrition, care and transit needs. [place] Support Programs: '@10RELATIVEDAYS'$ @ > Cancer Support Group  2nd Tuesday of the month 1pm-2pm, Journey Room  > Creative Journey  3rd Tuesday of the month 1130am-1pm, Journey Room  > Look Good Feel Better  1st Wednesday of the month 10am-12 noon, Journey Room (Call [place] to register [phone])

## 2016-10-18 NOTE — Progress Notes (Signed)
[name] presented for Portacath access and flush. Proper placement of portacath confirmed by CXR. Portacath located right chest wall accessed with  H 20 needle. Good blood return present. Portacath flushed with 20ml NS and 500U/5ml Heparin and needle removed intact. Procedure without incident. Patient tolerated procedure well.   

## 2016-10-28 DIAGNOSIS — E291 Testicular hypofunction: Secondary | ICD-10-CM | POA: Diagnosis not present

## 2016-10-29 DIAGNOSIS — R310 Gross hematuria: Secondary | ICD-10-CM | POA: Diagnosis not present

## 2016-10-29 DIAGNOSIS — E291 Testicular hypofunction: Secondary | ICD-10-CM | POA: Diagnosis not present

## 2016-10-29 DIAGNOSIS — N451 Epididymitis: Secondary | ICD-10-CM | POA: Diagnosis not present

## 2016-10-29 DIAGNOSIS — N50819 Testicular pain, unspecified: Secondary | ICD-10-CM | POA: Diagnosis not present

## 2016-10-29 DIAGNOSIS — R319 Hematuria, unspecified: Secondary | ICD-10-CM | POA: Diagnosis not present

## 2016-10-29 DIAGNOSIS — N529 Male erectile dysfunction, unspecified: Secondary | ICD-10-CM | POA: Diagnosis not present

## 2016-10-29 DIAGNOSIS — E538 Deficiency of other specified B group vitamins: Secondary | ICD-10-CM | POA: Diagnosis not present

## 2016-10-29 DIAGNOSIS — N393 Stress incontinence (female) (male): Secondary | ICD-10-CM | POA: Diagnosis not present

## 2016-10-29 DIAGNOSIS — N359 Urethral stricture, unspecified: Secondary | ICD-10-CM | POA: Diagnosis not present

## 2016-10-29 DIAGNOSIS — N200 Calculus of kidney: Secondary | ICD-10-CM | POA: Diagnosis not present

## 2016-10-29 DIAGNOSIS — N390 Urinary tract infection, site not specified: Secondary | ICD-10-CM | POA: Diagnosis not present

## 2016-10-29 DIAGNOSIS — Z8546 Personal history of malignant neoplasm of prostate: Secondary | ICD-10-CM | POA: Diagnosis not present

## 2016-10-29 DIAGNOSIS — N281 Cyst of kidney, acquired: Secondary | ICD-10-CM | POA: Diagnosis not present

## 2016-11-04 ENCOUNTER — Encounter: Payer: Self-pay | Admitting: Neurology

## 2016-11-04 ENCOUNTER — Ambulatory Visit (INDEPENDENT_AMBULATORY_CARE_PROVIDER_SITE_OTHER): Payer: Medicare Other | Admitting: Neurology

## 2016-11-04 VITALS — BP 122/75 | HR 67 | Ht 71.0 in | Wt 237.8 lb

## 2016-11-04 DIAGNOSIS — G250 Essential tremor: Secondary | ICD-10-CM | POA: Diagnosis not present

## 2016-11-04 DIAGNOSIS — F09 Unspecified mental disorder due to known physiological condition: Secondary | ICD-10-CM

## 2016-11-04 MED ORDER — PROPRANOLOL HCL 20 MG PO TABS: 20.0000 mg | ORAL_TABLET | Freq: Four times a day (QID) | ORAL | 12 refills | Status: DC | PRN

## 2016-11-04 NOTE — Progress Notes (Signed)
[id number]    Provider:  Dr Jaynee Eagles Referring Provider: Celene Squibb, MD Primary Care Physician:  Wende Neighbors, MD  CC: tremor  Interval history for tremor 11/04/2016: Patient is here for follow up on tremor on propranolol. He is fine taking the propranolol 3x a day. He can take it up to 4x a day as needed. Memory is stable. Didn't tolerate the Aricept. Discussed the tremor and decided to increase as tolerated and needed. Discussed other medications other than aricept but he declines we will just   Interval history 05/06/2016:Elizeo C Stovall is a 82 y.o. male here as a referral from Dr. [place] Crane for tremor. PMHx CKD, Type 2 DM, HLD, HTN, insomnia, OSA, depression, polyneuropathy, cerebral ischemia, b12 deficiency, mantle cell lymphoma, anxiety, parkinson's diseas (?). He goes by the name [name].  Had side effects with primidone and neurontin. He has been taking '20mg'$  propranolol three times a day. It has helped with the tremor and blood pressure has not been low and no problems taking the medication 3x a day.   MRI of the brain was unremarkable for age: IMPRESSION: Generalized cortical atrophy and chronic microvascular ischemicchanges, typical for age. No acute abnormality.   Interval history 01/22/2016: primidone and neurontin had side effects. Discussed propranolol and that this is heart medication, we need to be very mindful of his pulse and blood pressure and this can cause significant decreases in both, he should take his blood pressure and pulse daily, do not take if BP less than 11/60 or pulse < 60, stop for anything concerning whatsoever including SOB, CP. Discussed his memory, He forgetting conversation, things wife tells you, people's names, no dementia in the family. Been going on slowly progressive for at least 2 years. He takes b12 shots. He used to have a cpap and stopped. He is tired during the day. He snores, his wife doesn't. Last sleep study was 4-5 years ago. He  is Morbidly obese. Epworth sleep scale only 5/10, doesn't appear to be candidate for sleep stufy. Discussed neurocognitive testing as well.  HPI: DEADRIAN TOYA is a 82 y.o. male here as a referral from Dr. [place] Crane for tremor. PMHx CKD, Type 2 DM, HLD, HTN, insomnia, OSA, depression, polyneuropathy, cerebral ischemia, b12 deficiency, mantle cell lymphoma, anxiety, parkinson's diseas (?). He goes by the name [name]. Tremor has been going on for over a year. Worsening. Some days he doesn't have it. Today is a good day. When he has it, it is continuous, both resting and postural. He is weak all over, he has pain all over, he went to physical therapy and improved. He has been taking carbidopa/levodopa and it has not helped the tremor, he got it from a doctor in [place]. Tremor started more than year. Getting worse. He has a difficult time writing mostly. No difficulty eating, denies excessive coffee. Nothing makes it better or unknown. Grandmother had a tremor, she had it severely and also her head shook and she shook all over. Sister has a similar tremor. He has trouble turning pages due to the tremor. Slowly progressive. No other focal neurologic deficits.   Review of outside records; HgbA1c 6.5, cmp 08/24/2015 creatinine 1.28 bun 18 gfr 54 Cbc with 3.2 wbc and anemia 11.4/36.7  Review of Systems: Patient complains of symptoms per HPI as well as the following symptoms: weight gain, easy bruising, easy beeding, SOB, cough, swelling in legs, ringing in ears, cramps, aching musckes, allergies, runnynose, weaknes, decrease energy, change in appetite,  restless legs. Pertinent negatives per HPI. All others negative.  Social History   Social History  . Marital status: Married    Spouse name: [name]  . Number of children: 2  . Years of education: 8   Occupational History  . Retired  Retired   Social History Main Topics  . Smoking status: Never Smoker  . Smokeless tobacco: Never Used  .  Alcohol use No  . Drug use: No  . Sexual activity: Yes    Birth control/ protection: None   Other Topics Concern  . Not on file   Social History Narrative   Lives with wife   Caffeine use: none   Right-handed    Family History  Problem Relation Age of Onset  . Colon cancer Brother   . Cancer Brother   . Diabetes Father   . Cancer Brother     Past Medical History:  Diagnosis Date  . B12 deficiency 02/07/2014  . Colon cancer ([place])   . 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 Essential hypertension, benign   . GERD (gastroesophageal reflux disease)   . Mixed hyperlipidemia   . Obstructive sleep apnea    does not use  . Prostate cancer ([place])   . PVD (peripheral vascular disease) ([place])   . Type 2 diabetes mellitus ([place])     Past Surgical History:  Procedure Laterality Date  . BACK SURGERY    . BALLOON DILATION N/A 08/27/2013   Procedure: BALLOON DILATION;  Surgeon: Rogene Houston, MD;  [facility];  Service: Endoscopy;  Laterality: N/A;  . COLON SURGERY    . COLONOSCOPY N/A 12/17/2013   Procedure: COLONOSCOPY;  Surgeon: Rogene Houston, MD;  [facility];  Service: Endoscopy;  Laterality: N/A;  940  . COLONOSCOPY N/A 02/28/2014   Procedure: COLONOSCOPY;  Surgeon: Rogene Houston, MD;  [facility];  Service: Endoscopy;  Laterality: N/A;  730  . COLONOSCOPY N/A 08/04/2015   Procedure: COLONOSCOPY;  Surgeon: Rogene Houston, MD;  [facility];  Service: Endoscopy;  Laterality: N/A;  200 - moved to 11/11 @ 2:10 - Ann to notify pt  . COLONOSCOPY WITH ESOPHAGOGASTRODUODENOSCOPY (EGD) N/A 08/27/2013   Procedure: COLONOSCOPY WITH ESOPHAGOGASTRODUODENOSCOPY (EGD);  Surgeon: Rogene Houston, MD;  [facility];  Service: Endoscopy;  Laterality: N/A;  730  . KNEE ARTHROSCOPY Left   . KNEE SURGERY Right    total knee  . MALONEY DILATION  N/A 08/27/2013   Procedure: Venia Minks DILATION;  Surgeon: Rogene Houston, MD;  [facility];  Service: Endoscopy;  Laterality: N/A;  . PORTACATH PLACEMENT Right 2014  . PROSTATECTOMY    . removal of port Right   . SAVORY DILATION N/A 08/27/2013   Procedure: SAVORY DILATION;  Surgeon: Rogene Houston, MD;  [facility];  Service: Endoscopy;  Laterality: N/A;  . SHOULDER SURGERY    . TONSILLECTOMY    . VASECTOMY      Current Outpatient Prescriptions  Medication Sig Dispense Refill  . acetaminophen (TYLENOL) 325 MG tablet Take 650 mg by mouth every 4 (four) hours as needed for mild pain or moderate pain. Take 2 tablets 1 hour prior to Rituxan treatment.    [name] albuterol (PROAIR HFA) 108 (90 Base) MCG/ACT inhaler Inhale 2 puffs into the lungs at bedtime as needed for wheezing or shortness of  breath.     [name] amoxicillin-clavulanate (AUGMENTIN) 875-125 MG tablet Take 1 tablet by mouth 2 (two) times daily.     . budesonide-formoterol (SYMBICORT) 80-4.5 MCG/ACT inhaler Inhale 2 puffs into the lungs 2 (two) times daily. 1 Inhaler 0  . glipiZIDE (GLUCOTROL) 10 MG tablet Take 10 mg by mouth daily before breakfast.     . nitrofurantoin, macrocrystal-monohydrate, (MACROBID) 100 MG capsule Take 100 mg by mouth.    . ondansetron (ZOFRAN) 4 MG tablet Take 1 tablet (4 mg total) by mouth every 8 (eight) hours as needed for nausea or vomiting. 30 tablet 1  . pantoprazole (PROTONIX) 40 MG tablet Take 1 tablet (40 mg total) by mouth 2 (two) times daily before a meal. 60 tablet 5  . propranolol (INDERAL) 20 MG tablet Take 1 tablet (20 mg total) by mouth 3 (three) times daily. 120 tablet 12  . testosterone cypionate (DEPOTESTOSTERONE CYPIONATE) 200 MG/ML injection Inject 200 mg into the muscle every 14 (fourteen) days.     Nelva Nay SOLOSTAR 300 UNIT/ML SOPN Inject 55 Units into the skin every morning.     [name] amLODipine (NORVASC) 5 MG tablet Take 1 tablet (5 mg total) by mouth daily. 30 tablet 6    No current facility-administered medications for this visit.    Facility-Administered Medications Ordered in Other Visits  Medication Dose Route Frequency Provider Last Rate Last Dose  . heparin lock flush 100 unit/mL  500 Units Intravenous Once Baird Cancer, PA-C      . sodium chloride flush (NS) 0.9 % injection 20 mL  20 mL Intravenous PRN Baird Cancer, PA-C        Allergies as of 11/04/2016 - Review Complete 11/04/2016  Allergen Reaction Noted  . Bee venom Anaphylaxis   . Adhesive [tape] Hives   . Ciprofloxacin    . Codeine    . Iodine    . Latex    . Povidone-iodine  10/15/2010  . Simvastatin      Vitals: BP 122/75   Pulse 67   Ht '5\' 11"'$  (1.803 m)   Wt 237 lb 12.8 oz (107.9 kg)   BMI 33.17 kg/m  Last Weight:  Wt Readings from Last 1 Encounters:  11/04/16 237 lb 12.8 oz (107.9 kg)   Last Height:   Ht Readings from Last 1 Encounters:  11/04/16 '5\' 11"'$  (1.803 m)   MMSE - Mini Mental State Exam 11/04/2016  Orientation to time 5  Orientation to Place 5  Registration 3  Attention/ Calculation 1  Recall 1  Language- name 2 objects 2  Language- repeat 1  Language- follow 3 step command 3  Language- read & follow direction 1  Write a sentence 1  Copy design 1  Total score 24      Cranial Nerves:  The pupils are equal, round, and reactive to light. Visual fields are full to finger confrontation. Extraocular movements are intact. Trigeminal sensation is intact and the muscles of mastication are normal. The face is symmetric. The palate elevates in the midline. Hearing intact. Voice is normal. Shoulder shrug is normal. The tongue has normal motion without fasciculations.   Coordination:  Normal finger to nose and heel to shin.   Gait:  Mildly wide based  Motor Observation:  No asymmetry, no atrophy, High frequency low amplitude tremor with postural with action components (improved today) Tone:  Normal muscle tone. No cogwheeling. Not  increased.   Posture:  Posture is normal. normal erect   Strength:  Strength is V/V in the upper and lower limbs.    Sensation: dec pin prick, temp to the knees. Absent vibration at the toes.    Reflex Exam:  DTR's:  Deep tendon reflexes in the upper and lower extremities are hypo bilaterally.  Toes:  The toes are downgoing bilaterally.  Clonus:  Clonus is absent.    Assessment/Plan: 83 year old patient with what appears to be a familial essential tremor, memory loss (mild cogniitve impairment). Grandmother and sister have tremor. He has a low amplitude, high frequency tremor. I don't see any signs of parkinsonism, he does not shuffle, tone is normal, no resting tremor, not bradykinetic. I think this is an essential tremor. He was given sinemet from another doctor which didn't help because I don't think he has PD. Had side effects to primidone and neurontin. On propranolol and discussed risks in the elderly, hypotension and bradycardia, discussed it at length and he wants to try it.   - Tremor improved on propranolol, no side effects, continue '20mg'$  3x daily can take an additional tablet if needed during the day as tolerated.   - Memory loss:Labs and MRI of the brain - unremarkable. Did not tolerate Aricept. MMSE stable 24/30, mild cognitive impairment. He takes vitamin D daily and B12 shots, continue. Epworth sleep scale 5/10, doesn't appear to need a sleep eval for OSA.  - Discussed CREAD clinical trial for mild cognitive impairment.   tsh normal, he is on oral B12 due to b12 deficiency and follows with pcp.  Sarina Ill, MD  [facility] [address] [place], [place] [address]  Phone [phone] Fax [phone]  A total of 25 minutes was spent face-to-face with this patient. Over half this time was spent on counseling patient on the essential tremor and MCI diagnosis and different diagnostic and  therapeutic options available.

## 2016-11-04 NOTE — Patient Instructions (Signed)
Remember to drink plenty of fluid, eat healthy meals and do not skip any meals. Try to eat protein with a every meal and eat a healthy snack such as fruit or nuts in between meals. Try to keep a regular sleep-wake schedule and try to exercise daily, particularly in the form of walking, 20-30 minutes a day, if you can.   As far as your medications are concerned, I would like to suggest: Can take propranolol '20mg'$  up to 4x a day  I would like to see you back in 6 months, sooner if we need to. Please call us with any interim questions, concerns, problems, updates or refill requests.   Our phone number is [phone]. We also have an after hours call service for urgent matters and there is a physician on-call for urgent questions. For any emergencies you know to call 911 or go to the nearest emergency room

## 2016-11-07 ENCOUNTER — Encounter (HOSPITAL_COMMUNITY): Payer: Medicare Other | Attending: Hematology and Oncology

## 2016-11-07 ENCOUNTER — Encounter (HOSPITAL_COMMUNITY): Payer: Self-pay

## 2016-11-07 VITALS — BP 133/78 | HR 66 | Temp 97.8°F | Resp 18

## 2016-11-07 DIAGNOSIS — C8310 Mantle cell lymphoma, unspecified site: Secondary | ICD-10-CM | POA: Insufficient documentation

## 2016-11-07 DIAGNOSIS — E538 Deficiency of other specified B group vitamins: Secondary | ICD-10-CM

## 2016-11-07 DIAGNOSIS — C8319 Mantle cell lymphoma, extranodal and solid organ sites: Secondary | ICD-10-CM

## 2016-11-07 MED ORDER — CYANOCOBALAMIN 1000 MCG/ML IJ SOLN
INTRAMUSCULAR | Status: AC
  Filled 2016-11-07: qty 1

## 2016-11-07 MED ORDER — CYANOCOBALAMIN 1000 MCG/ML IJ SOLN
1000.0000 ug | Freq: Once | INTRAMUSCULAR | Status: AC
  Administered 2016-11-07: 1000 ug via INTRAMUSCULAR

## 2016-11-07 NOTE — Patient Instructions (Signed)
[place] at [facility] Discharge Instructions  RECOMMENDATIONS MADE BY THE CONSULTANT AND ANY TEST RESULTS WILL BE SENT TO YOUR REFERRING PHYSICIAN.  B12 injection today. Return as scheduled.   Thank you for choosing [place] at [facility] to provide your oncology and hematology care.  To afford each patient quality time with our provider, please arrive at least 15 minutes before your scheduled appointment time.    If you have a lab appointment with the [facility] please come in thru the  Main Entrance and check in at the main information desk  You need to re-schedule your appointment should you arrive 10 or more minutes late.  We strive to give you quality time with our providers, and arriving late affects you and other patients whose appointments are after yours.  Also, if you no show three or more times for appointments you may be dismissed from the clinic at the providers discretion.     Again, thank you for choosing [facility].  Our hope is that these requests will decrease the amount of time that you wait before being seen by our physicians.       _____________________________________________________________  Should you have questions after your visit to [facility], please contact our office at [phone] between the hours of 8:30 a.m. and 4:30 p.m.  Voicemails left after 4:30 p.m. will not be returned until the following business day.  For prescription refill requests, have your pharmacy contact our office.       Resources For Cancer Patients and their Caregivers ? American Cancer Society: Can assist with transportation, wigs, general needs, runs Look Good Feel Better.        202-376-5078 ? Cancer Care: Provides financial assistance, online support groups, medication/co-pay assistance.  1-800-813-HOPE 670-326-5642) ? [facility] Assists [place] cancer patients and their  families through emotional , educational and financial support.  (520) 748-5741 ? [place] DSS Where to apply for food stamps, Medicaid and utility assistance. 8065772133 ? RCATS: Transportation to medical appointments. 912-404-0877 ? Social Security Administration: May apply for disability if have a Stage IV cancer. (770)062-5198 364-551-0131 ? [employer], Disability and Transit Services: Assists with nutrition, care and transit needs. [place] Support Programs: @10RELATIVEDAYS @ > Cancer Support Group  2nd Tuesday of the month 1pm-2pm, Journey Room  > Creative Journey  3rd Tuesday of the month 1130am-1pm, Journey Room  > Look Good Feel Better  1st Wednesday of the month 10am-12 noon, Journey Room (Call [place] to register [phone])

## 2016-11-07 NOTE — Progress Notes (Signed)
[name] presents today for injection per the provider's orders.  B-12administration without incident; see MAR for injection details.  Patient tolerated procedure well and without incident.  No questions or complaints noted at this time. 

## 2016-11-11 DIAGNOSIS — E291 Testicular hypofunction: Secondary | ICD-10-CM | POA: Diagnosis not present

## 2016-11-22 DIAGNOSIS — E291 Testicular hypofunction: Secondary | ICD-10-CM | POA: Diagnosis not present

## 2016-11-25 DIAGNOSIS — L821 Other seborrheic keratosis: Secondary | ICD-10-CM | POA: Diagnosis not present

## 2016-11-25 DIAGNOSIS — Z85828 Personal history of other malignant neoplasm of skin: Secondary | ICD-10-CM | POA: Diagnosis not present

## 2016-11-25 DIAGNOSIS — L218 Other seborrheic dermatitis: Secondary | ICD-10-CM | POA: Diagnosis not present

## 2016-11-28 ENCOUNTER — Encounter (HOSPITAL_COMMUNITY): Payer: Self-pay

## 2016-11-28 ENCOUNTER — Encounter (HOSPITAL_COMMUNITY): Payer: Medicare Other

## 2016-11-28 ENCOUNTER — Encounter (HOSPITAL_COMMUNITY): Payer: Medicare Other | Attending: Oncology | Admitting: Oncology

## 2016-11-28 VITALS — BP 160/82 | HR 70 | Temp 98.1°F | Resp 20 | Wt 240.4 lb

## 2016-11-28 DIAGNOSIS — Z85038 Personal history of other malignant neoplasm of large intestine: Secondary | ICD-10-CM | POA: Diagnosis not present

## 2016-11-28 DIAGNOSIS — Z95828 Presence of other vascular implants and grafts: Secondary | ICD-10-CM | POA: Diagnosis not present

## 2016-11-28 DIAGNOSIS — I10 Essential (primary) hypertension: Secondary | ICD-10-CM | POA: Diagnosis not present

## 2016-11-28 DIAGNOSIS — Z808 Family history of malignant neoplasm of other organs or systems: Secondary | ICD-10-CM | POA: Insufficient documentation

## 2016-11-28 DIAGNOSIS — E538 Deficiency of other specified B group vitamins: Secondary | ICD-10-CM | POA: Insufficient documentation

## 2016-11-28 DIAGNOSIS — C8319 Mantle cell lymphoma, extranodal and solid organ sites: Secondary | ICD-10-CM | POA: Diagnosis not present

## 2016-11-28 DIAGNOSIS — I2510 Atherosclerotic heart disease of native coronary artery without angina pectoris: Secondary | ICD-10-CM | POA: Insufficient documentation

## 2016-11-28 DIAGNOSIS — C61 Malignant neoplasm of prostate: Secondary | ICD-10-CM

## 2016-11-28 DIAGNOSIS — K219 Gastro-esophageal reflux disease without esophagitis: Secondary | ICD-10-CM | POA: Insufficient documentation

## 2016-11-28 DIAGNOSIS — C8310 Mantle cell lymphoma, unspecified site: Secondary | ICD-10-CM

## 2016-11-28 DIAGNOSIS — Z833 Family history of diabetes mellitus: Secondary | ICD-10-CM | POA: Diagnosis not present

## 2016-11-28 DIAGNOSIS — Z8546 Personal history of malignant neoplasm of prostate: Secondary | ICD-10-CM | POA: Diagnosis not present

## 2016-11-28 DIAGNOSIS — Z5189 Encounter for other specified aftercare: Secondary | ICD-10-CM | POA: Diagnosis not present

## 2016-11-28 DIAGNOSIS — G4733 Obstructive sleep apnea (adult) (pediatric): Secondary | ICD-10-CM | POA: Insufficient documentation

## 2016-11-28 DIAGNOSIS — Z9889 Other specified postprocedural states: Secondary | ICD-10-CM | POA: Insufficient documentation

## 2016-11-28 DIAGNOSIS — E1151 Type 2 diabetes mellitus with diabetic peripheral angiopathy without gangrene: Secondary | ICD-10-CM | POA: Diagnosis not present

## 2016-11-28 DIAGNOSIS — E782 Mixed hyperlipidemia: Secondary | ICD-10-CM | POA: Diagnosis not present

## 2016-11-28 LAB — CBC WITH DIFFERENTIAL/PLATELET
BASOS ABS: 0 10*3/uL (ref 0.0–0.1)
BASOS PCT: 0 %
EOS ABS: 0.1 10*3/uL (ref 0.0–0.7)
Eosinophils Relative: 1 %
HCT: 43.6 % (ref 39.0–52.0)
HEMOGLOBIN: 14.3 g/dL (ref 13.0–17.0)
Lymphocytes Relative: 15 %
Lymphs Abs: 1 10*3/uL (ref 0.7–4.0)
MCH: 29.9 pg (ref 26.0–34.0)
MCHC: 32.8 g/dL (ref 30.0–36.0)
MCV: 91 fL (ref 78.0–100.0)
Monocytes Absolute: 0.4 10*3/uL (ref 0.1–1.0)
Monocytes Relative: 6 %
Neutro Abs: 5.3 10*3/uL (ref 1.7–7.7)
Neutrophils Relative %: 77 %
Platelets: 96 10*3/uL — ABNORMAL LOW (ref 150–400)
RBC: 4.79 MIL/uL (ref 4.22–5.81)
RDW: 15.9 % — ABNORMAL HIGH (ref 11.5–15.5)
WBC: 6.9 10*3/uL (ref 4.0–10.5)

## 2016-11-28 LAB — COMPREHENSIVE METABOLIC PANEL
ALBUMIN: 4 g/dL (ref 3.5–5.0)
ALK PHOS: 52 U/L (ref 38–126)
ALT: 15 U/L — AB (ref 17–63)
AST: 17 U/L (ref 15–41)
Anion gap: 8 (ref 5–15)
BUN: 23 mg/dL — ABNORMAL HIGH (ref 6–20)
CALCIUM: 8.5 mg/dL — AB (ref 8.9–10.3)
CO2: 28 mmol/L (ref 22–32)
CREATININE: 1.91 mg/dL — AB (ref 0.61–1.24)
Chloride: 100 mmol/L — ABNORMAL LOW (ref 101–111)
GFR calc Af Amer: 37 mL/min — ABNORMAL LOW (ref >60)
GFR calc non Af Amer: 32 mL/min — ABNORMAL LOW (ref >60)
GLUCOSE: 296 mg/dL — AB (ref 65–99)
Potassium: 4 mmol/L (ref 3.5–5.1)
SODIUM: 136 mmol/L (ref 135–145)
Total Bilirubin: 1.1 mg/dL (ref 0.3–1.2)
Total Protein: 6.6 g/dL (ref 6.5–8.1)

## 2016-11-28 LAB — PSA: PSA: <0.01 ng/mL (ref 0.00–4.00)

## 2016-11-28 LAB — LACTATE DEHYDROGENASE: LDH: 129 U/L (ref 98–192)

## 2016-11-28 MED ORDER — HEPARIN SOD (PORK) LOCK FLUSH 100 UNIT/ML IV SOLN
500.0000 [IU] | Freq: Once | INTRAVENOUS | Status: AC
  Administered 2016-11-28: 500 [IU] via INTRAVENOUS
  Filled 2016-11-28: qty 5

## 2016-11-28 MED ORDER — SODIUM CHLORIDE 0.9% FLUSH
10.0000 mL | INTRAVENOUS | Status: DC | PRN
  Administered 2016-11-28: 10 mL via INTRAVENOUS
  Filled 2016-11-28: qty 10

## 2016-11-28 NOTE — Patient Instructions (Signed)
[place] at [facility] Discharge Instructions  RECOMMENDATIONS MADE BY THE CONSULTANT AND ANY TEST RESULTS WILL BE SENT TO YOUR REFERRING PHYSICIAN.  You had your port flushed today and labs drawn. We will call you with those results. Return in 6-8 weeks for next port flush.   Thank you for choosing [place] at [facility] to provide your oncology and hematology care.  To afford each patient quality time with our provider, please arrive at least 15 minutes before your scheduled appointment time.    If you have a lab appointment with the [place] please come in thru the  Main Entrance and check in at the main information desk  You need to re-schedule your appointment should you arrive 10 or more minutes late.  We strive to give you quality time with our providers, and arriving late affects you and other patients whose appointments are after yours.  Also, if you no show three or more times for appointments you may be dismissed from the clinic at the providers discretion.     Again, thank you for choosing [facility].  Our hope is that these requests will decrease the amount of time that you wait before being seen by our physicians.       _____________________________________________________________  Should you have questions after your visit to [facility], please contact our office at [phone] between the hours of 8:30 a.m. and 4:30 p.m.  Voicemails left after 4:30 p.m. will not be returned until the following business day.  For prescription refill requests, have your pharmacy contact our office.       Resources For Cancer Patients and their Caregivers ? American Cancer Society: Can assist with transportation, wigs, general needs, runs Look Good Feel Better.        (952)670-2317 ? Cancer Care: Provides financial assistance, online support groups, medication/co-pay assistance.  1-800-813-HOPE  (228)151-6658) ? [facility] Assists [place] cancer patients and their families through emotional , educational and financial support.  (318) 723-3557 ? [place] DSS Where to apply for food stamps, Medicaid and utility assistance. (779)432-0054 ? RCATS: Transportation to medical appointments. (813)140-0833 ? Social Security Administration: May apply for disability if have a Stage IV cancer. (412)034-9905 (252)144-7095 ? [employer], Disability and Transit Services: Assists with nutrition, care and transit needs. [place] Support Programs: '@10RELATIVEDAYS'$ @ > Cancer Support Group  2nd Tuesday of the month 1pm-2pm, Journey Room  > Creative Journey  3rd Tuesday of the month 1130am-1pm, Journey Room  > Look Good Feel Better  1st Wednesday of the month 10am-12 noon, Journey Room (Call [place] to register [phone])

## 2016-11-28 NOTE — Progress Notes (Signed)
[name] presented for Portacath access and flush.  Portacath located right chest wall accessed with  H 20 needle.  Good blood return present. Blood drawn for CBCdiff, CMP, LDH, and PSA, labs sent down for resulting. Portacath flushed with 29m NS and 500U/534mHeparin and needle removed intact.  Procedure tolerated well and without incident.  Pt stable and discharged home ambulatory.

## 2016-11-28 NOTE — Progress Notes (Signed)
Brandon Neighbors, MD [address] [place] [address]  No diagnosis found.  CURRENT THERAPY: Surveillance per NCCN guidelines  INTERVAL HISTORY: [name] 82 y.o. male returns for followup of Mantle Cell lymphoma of the large intestine diagnosed 08/2013, S/P BR x 6 cycles from 09/14/2013- 02/07/2014, followed by maintenance Rituxan 05/10/2014- 04/01/2016. Colonoscopy on 02/28/2014 by Dr. Laural Golden demonstrated NED.     Mantle cell lymphoma ([place])   08/27/2013 Initial Diagnosis    Colon, biopsy, random - MANTLE CELL LYMPHOMA      09/06/2013 Imaging    CT CAP- No significant lymphadenopathy identified within the chest, abdomen or pelvis.      09/14/2013 - 02/07/2014 Chemotherapy    Bendamustine/Rituxan x 6 cycles with Neulasta support      12/17/2013 Procedure    Colonoscopy with biopsy by Dr. Laural Golden.      12/17/2013 Pathology Results    Colon, biopsy, ascending and transverse - BENIGN APPEARING COLONIC MUCOSA WITH SCATTERED ATYPICAL LYMPHOCYTES.Overall, these findings are suspicious for minimal residual mantle cell lymphoma.      02/28/2014 Procedure    Colonoscopy with biopsy by Dr. Laural Golden      02/28/2014 Pathology Results    Colon, biopsy, proximal and distal - BENIGN COLONIC MUCOSA. - NO SIGNIFICANT INFLAMMATION OR OTHER ABNORMALITIES IDENTIFIED. - NO EVIDENCE OF LYMPHOMA, ADENOMATOUS CHANGES OR MALIGNANCY.      02/28/2014 Remission    No evidence of malignancy on colonosocpy and pathology from biopsy.      05/10/2014 - 04/01/2016 Chemotherapy    Maintenance Rituxan 500 mg/m x 2 years.      05/11/2015 Imaging    No definite findings to suggest metastatic disease in chest, abdomen or pelvis, new ground glass atten in lungs B, nonspecific, repeat 6 to 12 mo      06/30/2015 - 07/02/2015 Hospital Admission    [facility]      08/04/2015 Procedure    Colonoscopy with biopsy by Dr. Laural Golden.      08/04/2015 Pathology Results    Colon, biopsy, right and left. - BENIGN  COLORECTAL MUCOSA. - ASSOCIATED BENIGN LYMPHOID AGGREGATES. - NO EVIDENCE OF SIGNIFICANT INFLAMMATION, DYSPLASIA OR MALIGNANCY.      05/10/2016 Imaging    CT CAP- 1. No definite findings to suggest metastatic disease in the chest, abdomen or pelvis. There are several new patchy areas of ground-glass attenuation in the lungs bilaterally which are highly nonspecific. The possibility of developing interstitial lung disease is not excluded, and repeat high-resolution chest CT is suggested in 6-12 months to assess for temporal changes in the appearance of the lung parenchyma.       06/17/2016 Imaging    CT chest high resolution- 1. No convincing findings of interstitial lung disease. Minimal subpleural reticulation and ground-glass attenuation in the dependent lower lobes is not significantly changed since 05/11/2015, and appears slightly less prominent on the inspiratory sequence, suggesting a combination of mild hypoventilatory change and minimal nonspecific scarring. No traction bronchiectasis or frank honeycombing. 2. Two tiny solid pulmonary nodules in the right middle lobe and left lower lobe are stable and probably benign. 3. Left lower lobe ground-glass 11 mm nodule is stable. Given persistence, repeat CT is recommended every 2 years until 5 years of stability has been established. This recommendation follows the consensus statement: Guidelines for Management of Incidental Pulmonary Nodules Detected on CT Images: From the Fleischner Society 2017; Radiology 2017; 284:228-243. 4. Additional findings include aortic atherosclerosis and coronary atherosclerosis.  08/19/2016 Imaging    CT abd/pelvis- 1. Tiny nonobstructive stones in the right kidney. No cause for left flank pain identified. 2. Diverticulosis without focal diverticulitis. 3. Atherosclerosis. 4. Rounded density over the left lower chest on the scout view only, not seen on the June 17, 2016 CT is likely  something on the patient's such as an EKG lead. Recommend clinical correlation. 5. Cholelithiasis.       [name] presents today unaccompanied for a follow up. I personally reviewed and went over labs with the patient.  He states he has been doing well.  He notes he had stomach pain and nausea in January. It has since been resolved.   Denies chest pain, sob, abdominal pain, drenching night sweats, unexplained weight loss, fatigue.  He states his port has not been bothering him.   He does not have any other questions or concerns at this time.  Review of Systems  Constitutional: Negative.  Negative for malaise/fatigue and weight loss.       Denies drenching night sweats.  HENT: Negative.   Eyes: Negative.   Respiratory: Negative.  Negative for shortness of breath.   Cardiovascular: Negative.  Negative for chest pain.  Gastrointestinal: Negative.  Negative for abdominal pain.  Genitourinary: Negative.   Musculoskeletal: Negative.   Skin: Negative.   Neurological: Negative.   Endo/Heme/Allergies: Negative.   Psychiatric/Behavioral: Negative.   All other systems reviewed and are negative.   Past Medical History:  Diagnosis Date  . B12 deficiency 02/07/2014  . Colon cancer ([place])   . 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 Essential hypertension, benign   . GERD (gastroesophageal reflux disease)   . Mixed hyperlipidemia   . Obstructive sleep apnea    does not use  . Prostate cancer ([place])   . PVD (peripheral vascular disease) ([place])   . Type 2 diabetes mellitus ([place])     Past Surgical History:  Procedure Laterality Date  . BACK SURGERY    . BALLOON DILATION N/A 08/27/2013   Procedure: BALLOON DILATION;  Surgeon: Rogene Houston, MD;  [facility];  Service: Endoscopy;  Laterality: N/A;  . COLON SURGERY    . COLONOSCOPY N/A 12/17/2013   Procedure: COLONOSCOPY;   Surgeon: Rogene Houston, MD;  [facility];  Service: Endoscopy;  Laterality: N/A;  940  . COLONOSCOPY N/A 02/28/2014   Procedure: COLONOSCOPY;  Surgeon: Rogene Houston, MD;  [facility];  Service: Endoscopy;  Laterality: N/A;  730  . COLONOSCOPY N/A 08/04/2015   Procedure: COLONOSCOPY;  Surgeon: Rogene Houston, MD;  [facility];  Service: Endoscopy;  Laterality: N/A;  200 - moved to 11/11 @ 2:10 - Ann to notify pt  . COLONOSCOPY WITH ESOPHAGOGASTRODUODENOSCOPY (EGD) N/A 08/27/2013   Procedure: COLONOSCOPY WITH ESOPHAGOGASTRODUODENOSCOPY (EGD);  Surgeon: Rogene Houston, MD;  [facility];  Service: Endoscopy;  Laterality: N/A;  730  . KNEE ARTHROSCOPY Left   . KNEE SURGERY Right    total knee  . MALONEY DILATION N/A 08/27/2013   Procedure: Venia Minks DILATION;  Surgeon: Rogene Houston, MD;  [facility];  Service: Endoscopy;  Laterality: N/A;  . PORTACATH PLACEMENT Right 2014  . PROSTATECTOMY    . removal of port Right   . SAVORY DILATION N/A 08/27/2013   Procedure: SAVORY DILATION;  Surgeon: Rogene Houston, MD;  [facility];  Service: Endoscopy;  Laterality: N/A;  . SHOULDER SURGERY    . TONSILLECTOMY    . VASECTOMY      Family History  Problem Relation Age of Onset  . Colon cancer Brother   . Cancer Brother   . Diabetes Father   . Cancer Brother     Social History   Social History  . Marital status: Married    Spouse name: [name]  . Number of children: 2  . Years of education: 8   Occupational History  . Retired  Retired   Social History Main Topics  . Smoking status: Never Smoker  . Smokeless tobacco: Never Used  . Alcohol use No  . Drug use: No  . Sexual activity: Yes    Birth control/ protection: None   Other Topics Concern  . None   Social History Narrative   Lives with wife   Caffeine use: none   Right-handed     PHYSICAL EXAMINATION  ECOG PERFORMANCE STATUS: 1 - Symptomatic but  completely ambulatory   Vitals:   11/28/16 1058  BP: (!) 160/82  Pulse: 70  Resp: 20  Temp: 98.1 F (36.7 C)   Filed Weights   11/28/16 1058  Weight: 240 lb 6.4 oz (109 kg)    Physical Exam  Constitutional: He is oriented to person, place, and time and well-developed, well-nourished, and in no distress.  HENT:  Head: Normocephalic and atraumatic.  Eyes: Conjunctivae and EOM are normal. Pupils are equal, round, and reactive to light.  Neck: Normal range of motion. Neck supple.  Cardiovascular: Normal rate, regular rhythm and normal heart sounds.   Pulmonary/Chest: Effort normal and breath sounds normal.  Abdominal: Soft. Bowel sounds are normal.  Musculoskeletal: Normal range of motion.  Neurological: He is alert and oriented to person, place, and time. Gait normal.  Skin: Skin is warm and dry.  Nursing note and vitals reviewed.    LABORATORY DATA: CBC    Component Value Date/Time   WBC 7.4 09/06/2016 1024   RBC 5.05 09/06/2016 1024   HGB 15.2 09/06/2016 1024   HCT 49.0 09/06/2016 1024   PLT 126 (L) 09/06/2016 1024   MCV 97.0 09/06/2016 1024   MCH 30.1 09/06/2016 1024   MCHC 31.0 09/06/2016 1024   RDW 15.8 (H) 09/06/2016 1024   LYMPHSABS 1.1 09/06/2016 1024   MONOABS 0.3 09/06/2016 1024   EOSABS 0.1 09/06/2016 1024   BASOSABS 0.0 09/06/2016 1024      Chemistry      Component Value Date/Time   NA 137 06/03/2016 1456   K 3.9 06/03/2016 1456   CL 102 06/03/2016 1456   CO2 28 06/03/2016 1456   BUN 16 06/03/2016 1456   CREATININE 1.21 06/03/2016 1456   CREATININE 1.17 11/28/2014 0955      Component Value Date/Time   CALCIUM 8.6 (L) 06/03/2016 1456   ALKPHOS 57 06/03/2016 1456   AST 22 06/03/2016 1456   ALT 18 06/03/2016 1456   BILITOT 0.6 06/03/2016 1456        PENDING LABS:   RADIOGRAPHIC STUDIES: I have personally reviewed the radiological images as listed and agreed with the findings in the report. No results found.  CT CHEST WITHOUT  CONTRAST 09/09/2016  IMPRESSION: No lesion is seen corresponding to the area of concern over the left lower hemithorax seen on the scout tomogram of most recent study. There is currently no chest wall lesion. There is no new parenchymal lung lesion. The largest ground-glass  nodular lesions seen on previous study is smaller currently. As this lesion does persist, recommendations from CT examination September 2017 remain in effect. No new nodular opacities. No adenopathy. There is atherosclerotic calcification at multiple sites including multiple foci of coronary artery calcification.   NUCLEAR MEDICINE GASTRIC EMPTYING SCAN 09/27/2016  IMPRESSION: Normal gastric emptying study.   PATHOLOGY:    ASSESSMENT AND PLAN:  Mantle Cell lymphoma of the large intestine diagnosed 08/2013, S/P BR x 6 cycles from 09/14/2013- 02/07/2014, followed by maintenance Rituxan 05/10/2014- 04/01/2016.  PLAN: Clinically NED. Labs reviewed. Results noted above.  He will have his port flushed today. He will get CBC, CMP, and LDH labs drawn today and on his next visit.    RTC in 3 months with repeat labs.  THERAPY PLAN:  Surveillance for Mantle Cell Lymphoma following complete response to therapy per NCCN guidelines (4.2017):  A. Clinical follow-up every 3-6 months for 5 years and then annually, or as clinically indicated.    All questions were answered. The patient knows to call the clinic with any problems, questions or concerns. We can certainly see the patient much sooner if necessary.  This document serves as a record of services personally performed by Twana First, MD. It was created on her behalf by Shirlean Mylar, a trained medical scribe. The creation of this record is based on the scribe's personal observations and the provider's statements to them. This document has been checked and approved by the attending provider.  I have reviewed the above documentation for accuracy and completeness and I  agree with the above.  This note is electronically signed by: Mikey College 11/28/2016 11:05 AM

## 2016-11-28 NOTE — Patient Instructions (Signed)
[place] at [facility] Discharge Instructions  RECOMMENDATIONS MADE BY THE CONSULTANT AND ANY TEST RESULTS WILL BE SENT TO YOUR REFERRING PHYSICIAN.  You were seen today by Dr. Twana First Continue port flush every 6-8 weeks Lab work today we will call with results Follow up in 3 months with lab work  See Amy up front for appointments   Thank you for choosing [place] at [facility] to provide your oncology and hematology care.  To afford each patient quality time with our provider, please arrive at least 15 minutes before your scheduled appointment time.    If you have a lab appointment with the [place] [facility] please come in thru the  Main Entrance and check in at the main information desk  You need to re-schedule your appointment should you arrive 10 or more minutes late.  We strive to give you quality time with our providers, and arriving late affects you and other patients whose appointments are after yours.  Also, if you no show three or more times for appointments you may be dismissed from the clinic at the providers discretion.     Again, thank you for choosing [facility].  Our hope is that these requests will decrease the amount of time that you wait before being seen by our physicians.       _____________________________________________________________  Should you have questions after your visit to [facility], please contact our office at [phone] between the hours of 8:30 a.m. and 4:30 p.m.  Voicemails left after 4:30 p.m. will not be returned until the following business day.  For prescription refill requests, have your pharmacy contact our office.       Resources For Cancer Patients and their Caregivers ? American Cancer Society: Can assist with transportation, wigs, general needs, runs Look Good Feel Better.        805-329-9051 ? Cancer Care: Provides financial assistance, online support  groups, medication/co-pay assistance.  1-800-813-HOPE 4583997495) ? [address] Assists [place] cancer patients and their families through emotional , educational and financial support.  (919) 278-1642 ? [place] DSS Where to apply for food stamps, Medicaid and utility assistance. 518-753-3905 ? [facility]: Transportation to medical appointments. 414 375 8441 ? Social Security Administration: May apply for disability if have a Stage IV cancer. 240-172-9002 (616)409-4480 ? [employer], Disability and Transit Services: Assists with nutrition, care and transit needs. [place] Support Programs: '@10RELATIVEDAYS'$ @ > Cancer Support Group  2nd Tuesday of the month 1pm-2pm, Journey Room  > Creative Journey  3rd Tuesday of the month 1130am-1pm, Journey Room  > Look Good Feel Better  1st Wednesday of the month 10am-12 noon, Journey Room (Call [place] to register [phone])

## 2016-11-30 DIAGNOSIS — B349 Viral infection, unspecified: Secondary | ICD-10-CM | POA: Diagnosis not present

## 2016-11-30 DIAGNOSIS — R062 Wheezing: Secondary | ICD-10-CM | POA: Diagnosis not present

## 2016-11-30 DIAGNOSIS — R0602 Shortness of breath: Secondary | ICD-10-CM | POA: Diagnosis not present

## 2016-12-02 DIAGNOSIS — H1031 Unspecified acute conjunctivitis, right eye: Secondary | ICD-10-CM | POA: Diagnosis not present

## 2016-12-05 ENCOUNTER — Encounter (HOSPITAL_COMMUNITY): Payer: Self-pay

## 2016-12-05 ENCOUNTER — Encounter (HOSPITAL_BASED_OUTPATIENT_CLINIC_OR_DEPARTMENT_OTHER): Payer: Medicare Other

## 2016-12-05 VITALS — BP 140/78 | HR 66 | Temp 97.6°F | Resp 18

## 2016-12-05 DIAGNOSIS — C8319 Mantle cell lymphoma, extranodal and solid organ sites: Secondary | ICD-10-CM

## 2016-12-05 DIAGNOSIS — E538 Deficiency of other specified B group vitamins: Secondary | ICD-10-CM

## 2016-12-05 MED ORDER — CYANOCOBALAMIN 1000 MCG/ML IJ SOLN
INTRAMUSCULAR | Status: AC
  Filled 2016-12-05: qty 1

## 2016-12-05 MED ORDER — CYANOCOBALAMIN 1000 MCG/ML IJ SOLN
1000.0000 ug | Freq: Once | INTRAMUSCULAR | Status: AC
  Administered 2016-12-05: 1000 ug via INTRAMUSCULAR

## 2016-12-05 NOTE — Patient Instructions (Signed)
[facility] at [facility] Discharge Instructions  RECOMMENDATIONS MADE BY THE CONSULTANT AND ANY TEST RESULTS WILL BE SENT TO YOUR REFERRING PHYSICIAN.  B12 injection today. Return as scheduled.   Thank you for choosing [facility] at [facility] to provide your oncology and hematology care.  To afford each patient quality time with our provider, please arrive at least 15 minutes before your scheduled appointment time.    If you have a lab appointment with the Cancer Center please come in thru the  Main Entrance and check in at the main information desk  You need to re-schedule your appointment should you arrive 10 or more minutes late.  We strive to give you quality time with our providers, and arriving late affects you and other patients whose appointments are after yours.  Also, if you no show three or more times for appointments you may be dismissed from the clinic at the providers discretion.     Again, thank you for choosing [facility].  Our hope is that these requests will decrease the amount of time that you wait before being seen by our physicians.       _____________________________________________________________  Should you have questions after your visit to [facility], please contact our office at [phone] between the hours of 8:30 a.m. and 4:30 p.m.  Voicemails left after 4:30 p.m. will not be returned until the following business day.  For prescription refill requests, have your pharmacy contact our office.       Resources For Cancer Patients and their Caregivers ? American Cancer Society: Can assist with transportation, wigs, general needs, runs Look Good Feel Better.        1-888-227-6333 ? Cancer Care: Provides financial assistance, online support groups, medication/co-pay assistance.  1-800-813-HOPE (4673) ? [facility] Assists [place] cancer patients and their  families through emotional , educational and financial support.  336-427-4357 ? [place] DSS Where to apply for food stamps, Medicaid and utility assistance. 336-342-1394 ? [facility]: Transportation to medical appointments. 336-347-2287 ? Social Security Administration: May apply for disability if have a Stage IV cancer. 336-342-7796 1-800-772-1213 ? [place] [facility], Disability and Transit Services: Assists with nutrition, care and transit needs. 336-349-2343  Cancer Center Support Programs: @10RELATIVEDAYS@ > Cancer Support Group  2nd Tuesday of the month 1pm-2pm, Journey Room  > Creative Journey  3rd Tuesday of the month 1130am-1pm, Journey Room  > Look Good Feel Better  1st Wednesday of the month 10am-12 noon, Journey Room (Call American Cancer Society to register 1-800-395-5775)   

## 2016-12-05 NOTE — Progress Notes (Signed)
[name] presents today for injection per the provider's orders.  B-12administration without incident; see MAR for injection details.  Patient tolerated procedure well and without incident.  No questions or complaints noted at this time. 

## 2016-12-09 DIAGNOSIS — E291 Testicular hypofunction: Secondary | ICD-10-CM | POA: Diagnosis not present

## 2016-12-19 IMAGING — CT CT ABD-PELV W/O CM
2 of 4 series · 16 of 46 positions shown, 18 images · non-contrast
Comparison: May 11, 2015

CLINICAL DATA: Left flank pain with hematuria for 2 weeks.

EXAM:
CT ABDOMEN AND PELVIS WITHOUT CONTRAST
TECHNIQUE: Multidetector CT imaging of the abdomen and pelvis was performed
following the standard protocol without IV contrast.

[Series 2: axial st · axial · 0.97mm/px · z∈[+914,+1339]mm · 13 of 93 slices shown, 15 images]
[im 4/93  soft-tissue]
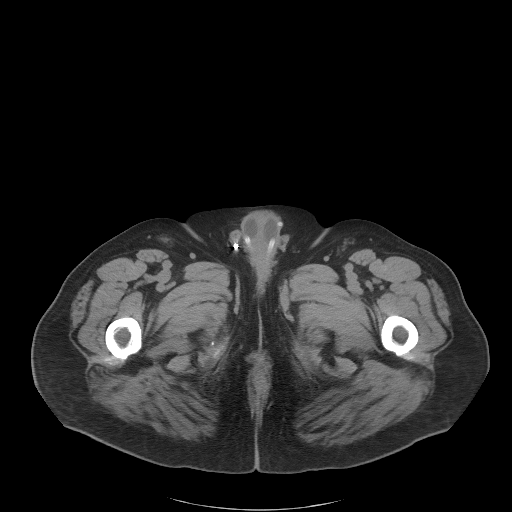
[im 4/93  bone]
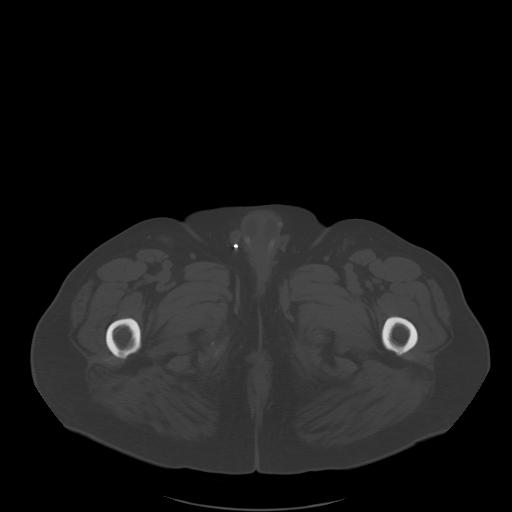
[im 12/93  soft-tissue]
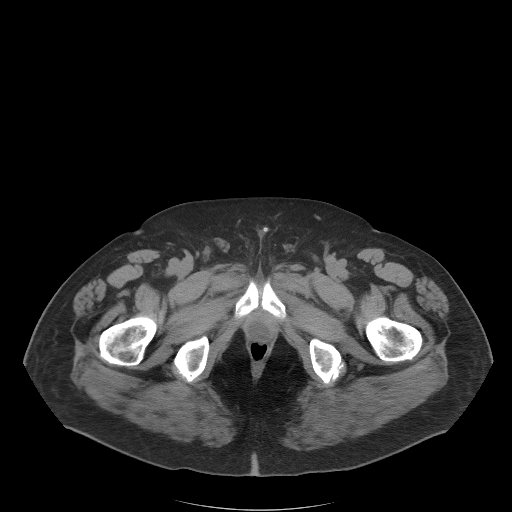
[im 20/93  soft-tissue]
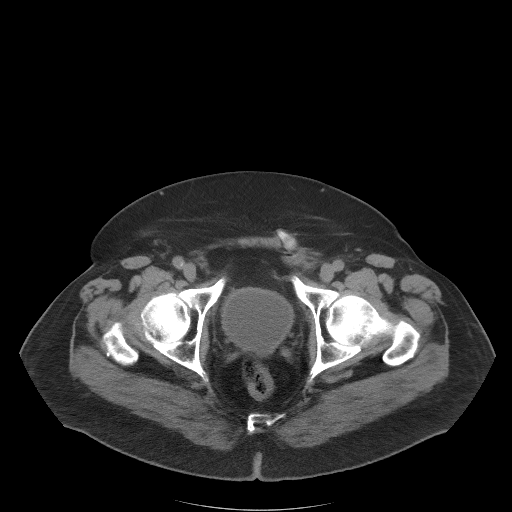
[im 27/93  soft-tissue]
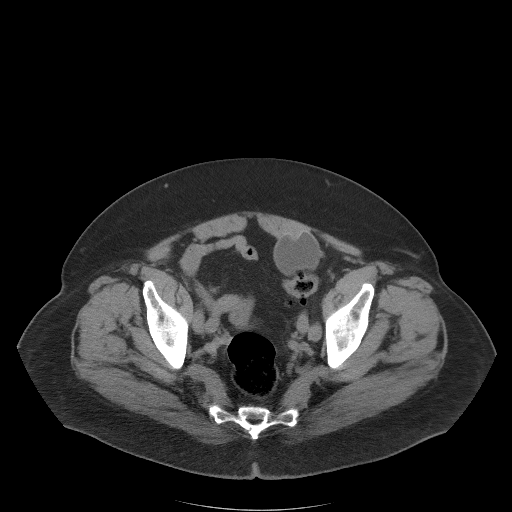
[im 31/93  soft-tissue]
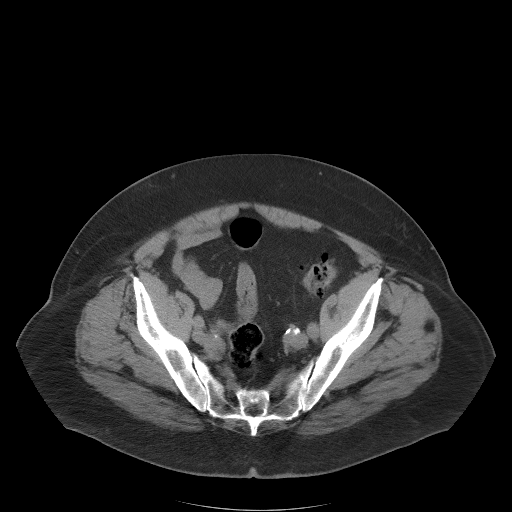
[im 39/93  soft-tissue]
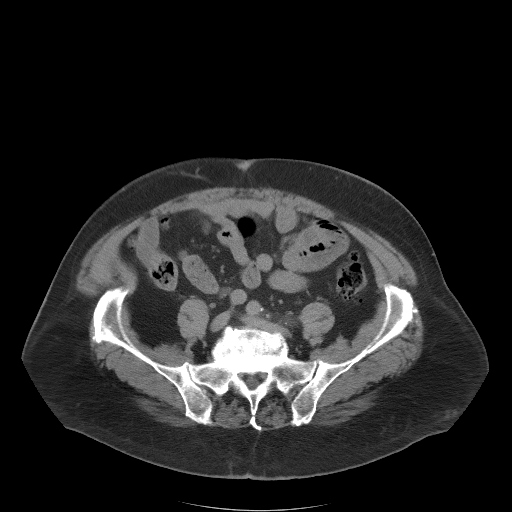
[im 47/93  soft-tissue]
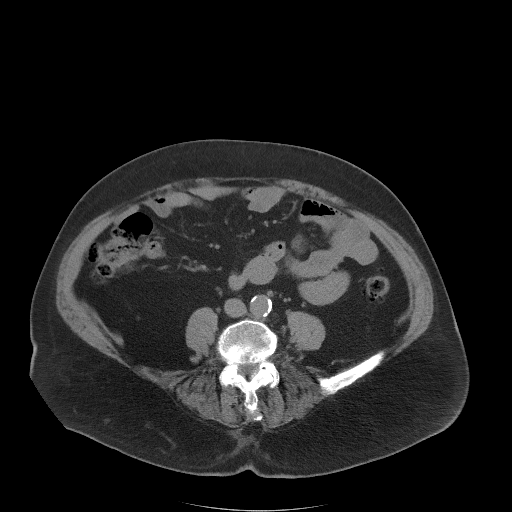
[im 54/93  soft-tissue]
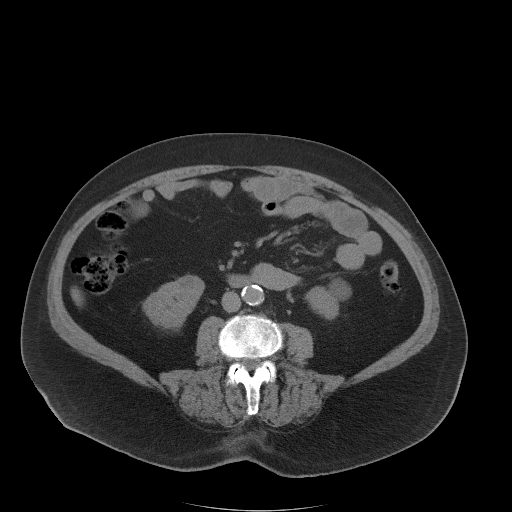
[im 62/93  soft-tissue]
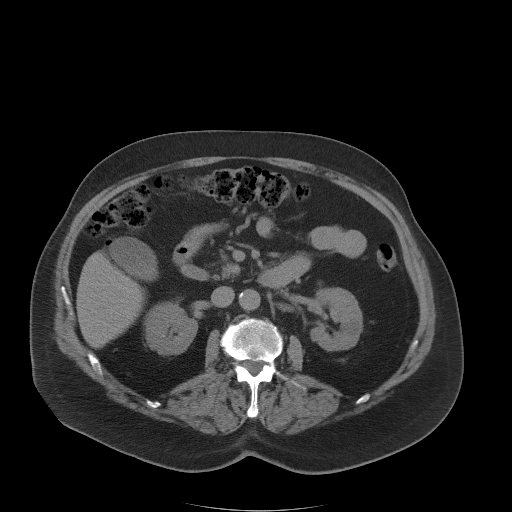
[im 62/93  bone]
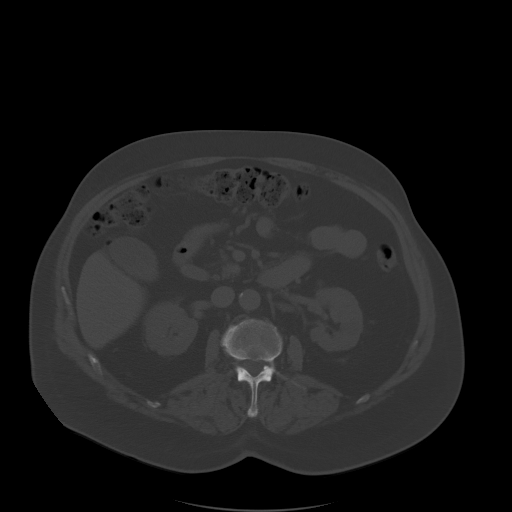
[im 66/93  soft-tissue]
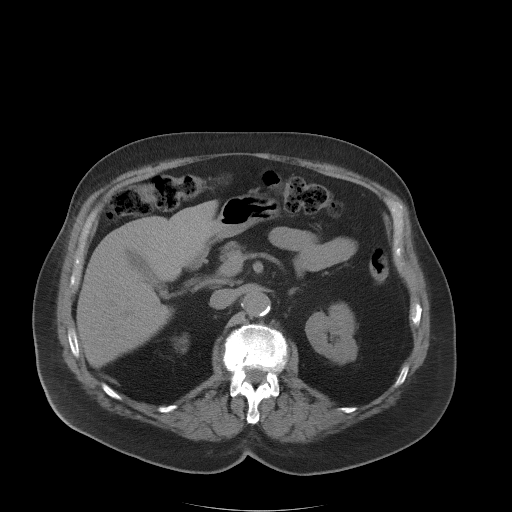
[im 73/93  soft-tissue]
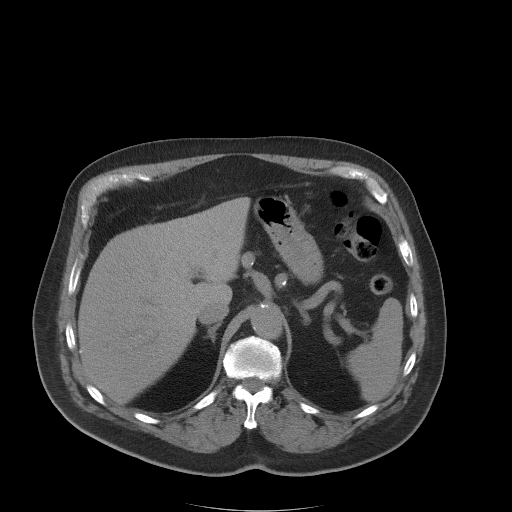
[im 81/93  soft-tissue]
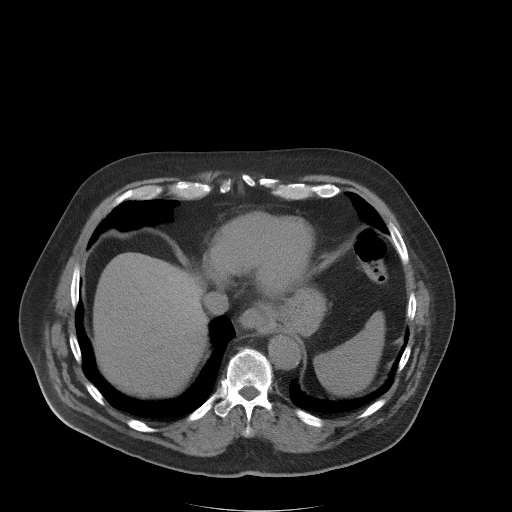
[im 89/93  soft-tissue]
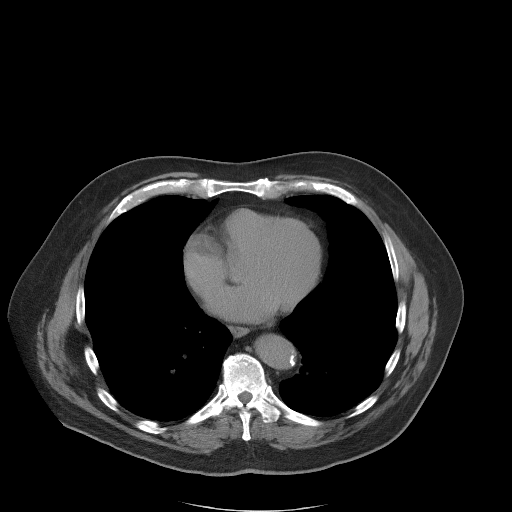

[Series 4: coronal st · coronal · 0.92mm/px · 3 of 111 slices shown]
[im 37/111  soft-tissue]
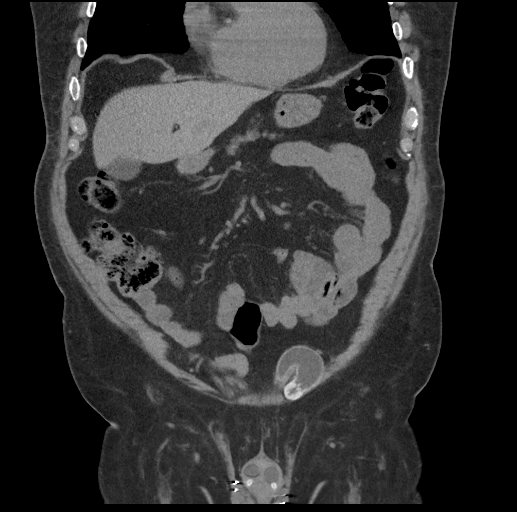
[im 49/111  soft-tissue]
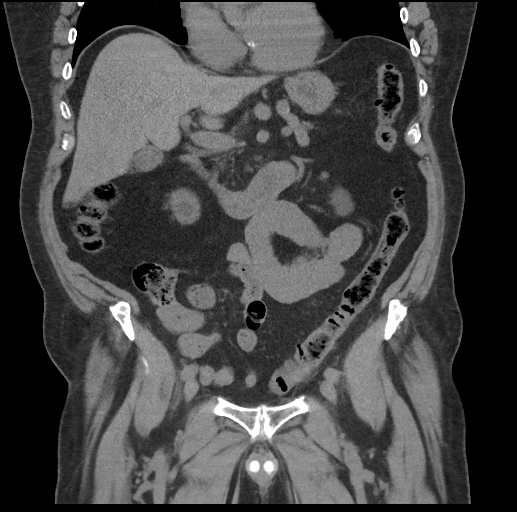
[im 62/111  soft-tissue]
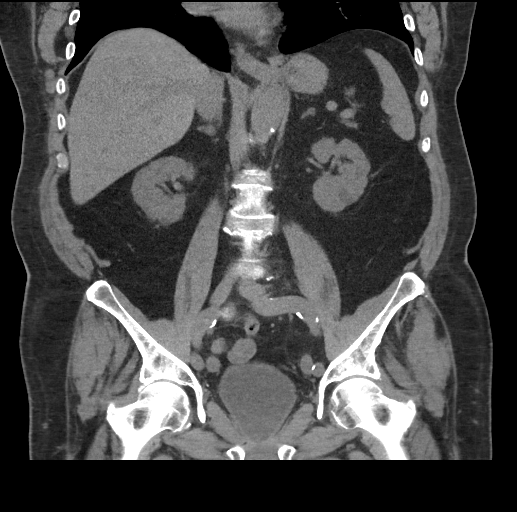

[16 of 46 positions shown; findings below may reference images not displayed]

FINDINGS: Lower chest: The scout view demonstrates a rounded density over the
left lower chest. This was not present on the June 17, 2016 CT
of the chest and is likely something on the patient. No other
abnormalities in the upper abdomen.

Hepatobiliary: Cholelithiasis is identified dependently in the
gallbladder. The liver is otherwise unremarkable.

Pancreas: The tail of the pancreas is largely fatty replaced and
atrophic. The remainder of the pancreas is unremarkable.

Spleen: Normal in size without focal abnormality.

Adrenals/Urinary Tract: The tiny left adrenal nodule is unchanged,
likely a small adenoma but too small to characterize. The right
adrenal gland demonstrates another probable tiny adenoma. Bilateral
renal cysts are seen with no suspicious masses. Probable tiny stones
in the right kidney identified on coronal image 66 measuring 2 or 3
mm. No hydronephrosis or acute perinephric stranding. No
ureterectasis or ureteral stone. The bladder is unremarkable.

Stomach/Bowel: The stomach and small bowel are normal. The colon
demonstrates diverticulosis without focal diverticulitis colon is
otherwise unremarkable. The appendix demonstrates no evidence
appendicitis.

Vascular/Lymphatic: Atherosclerotic changes are seen within the non
aneurysmal abdominal aorta, iliac vessels common femoral vessels. No
adenopathy.

Reproductive: A penile implant is identified. The prostate appears
to be surgically absent.

Other: No other abnormalities.

Musculoskeletal: A sclerotic focus in the right posterior iliac bone
on series 4, image 85 is unchanged, likely a bone island.
Degenerative changes seen in the spine.
IMPRESSION: 1. Tiny nonobstructive stones in the right kidney. No cause for left
flank pain identified.
2. Diverticulosis without focal diverticulitis.
3. Atherosclerosis.
4. Rounded density over the left lower chest on the scout view only,
not seen on the June 17, 2016 CT is likely something on the
patient's such as an EKG lead. Recommend clinical correlation.
5. Cholelithiasis.

## 2016-12-20 DIAGNOSIS — E538 Deficiency of other specified B group vitamins: Secondary | ICD-10-CM | POA: Diagnosis not present

## 2016-12-20 DIAGNOSIS — Z8546 Personal history of malignant neoplasm of prostate: Secondary | ICD-10-CM | POA: Diagnosis not present

## 2016-12-20 DIAGNOSIS — G8929 Other chronic pain: Secondary | ICD-10-CM | POA: Diagnosis not present

## 2016-12-20 DIAGNOSIS — N529 Male erectile dysfunction, unspecified: Secondary | ICD-10-CM | POA: Diagnosis not present

## 2016-12-20 DIAGNOSIS — N5082 Scrotal pain: Secondary | ICD-10-CM | POA: Diagnosis not present

## 2016-12-20 DIAGNOSIS — N393 Stress incontinence (female) (male): Secondary | ICD-10-CM | POA: Diagnosis not present

## 2016-12-20 DIAGNOSIS — N281 Cyst of kidney, acquired: Secondary | ICD-10-CM | POA: Diagnosis not present

## 2016-12-20 DIAGNOSIS — N183 Chronic kidney disease, stage 3 (moderate): Secondary | ICD-10-CM | POA: Diagnosis not present

## 2016-12-20 DIAGNOSIS — N359 Urethral stricture, unspecified: Secondary | ICD-10-CM | POA: Diagnosis not present

## 2016-12-20 DIAGNOSIS — E291 Testicular hypofunction: Secondary | ICD-10-CM | POA: Diagnosis not present

## 2016-12-20 DIAGNOSIS — Z87442 Personal history of urinary calculi: Secondary | ICD-10-CM | POA: Diagnosis not present

## 2016-12-23 DIAGNOSIS — E291 Testicular hypofunction: Secondary | ICD-10-CM | POA: Diagnosis not present

## 2016-12-30 DIAGNOSIS — E1122 Type 2 diabetes mellitus with diabetic chronic kidney disease: Secondary | ICD-10-CM | POA: Diagnosis not present

## 2016-12-30 DIAGNOSIS — E782 Mixed hyperlipidemia: Secondary | ICD-10-CM | POA: Diagnosis not present

## 2016-12-30 DIAGNOSIS — D519 Vitamin B12 deficiency anemia, unspecified: Secondary | ICD-10-CM | POA: Diagnosis not present

## 2016-12-30 DIAGNOSIS — I10 Essential (primary) hypertension: Secondary | ICD-10-CM | POA: Diagnosis not present

## 2016-12-30 DIAGNOSIS — R300 Dysuria: Secondary | ICD-10-CM | POA: Diagnosis not present

## 2016-12-30 DIAGNOSIS — E291 Testicular hypofunction: Secondary | ICD-10-CM | POA: Diagnosis not present

## 2016-12-30 DIAGNOSIS — Z125 Encounter for screening for malignant neoplasm of prostate: Secondary | ICD-10-CM | POA: Diagnosis not present

## 2017-01-03 DIAGNOSIS — E1142 Type 2 diabetes mellitus with diabetic polyneuropathy: Secondary | ICD-10-CM | POA: Diagnosis not present

## 2017-01-03 DIAGNOSIS — B351 Tinea unguium: Secondary | ICD-10-CM | POA: Diagnosis not present

## 2017-01-06 DIAGNOSIS — D631 Anemia in chronic kidney disease: Secondary | ICD-10-CM | POA: Diagnosis not present

## 2017-01-06 DIAGNOSIS — N182 Chronic kidney disease, stage 2 (mild): Secondary | ICD-10-CM | POA: Diagnosis not present

## 2017-01-06 DIAGNOSIS — Z6835 Body mass index (BMI) 35.0-35.9, adult: Secondary | ICD-10-CM | POA: Diagnosis not present

## 2017-01-06 DIAGNOSIS — E1122 Type 2 diabetes mellitus with diabetic chronic kidney disease: Secondary | ICD-10-CM | POA: Diagnosis not present

## 2017-01-06 DIAGNOSIS — E782 Mixed hyperlipidemia: Secondary | ICD-10-CM | POA: Diagnosis not present

## 2017-01-06 DIAGNOSIS — E291 Testicular hypofunction: Secondary | ICD-10-CM | POA: Diagnosis not present

## 2017-01-09 IMAGING — CT CT CHEST W/O CM
2 of 3 series · 14 of 36 positions shown, 17 images · non-contrast
Comparison: CT abdomen and pelvis including lung bases August 19, 2016; chest CT June 17, 2016

CLINICAL DATA: Abnormal opacity on recent scout tomogram for CT
abdomen and pelvis

EXAM:
CT CHEST WITHOUT CONTRAST
TECHNIQUE: Multidetector CT imaging of the chest was performed following the
standard protocol without IV contrast.

[Series 2: thorax · axial · 0.83mm/px · z∈[-313,-51]mm · 11 of 155 slices shown, 14 images]
[im 12/155  mediastinal]
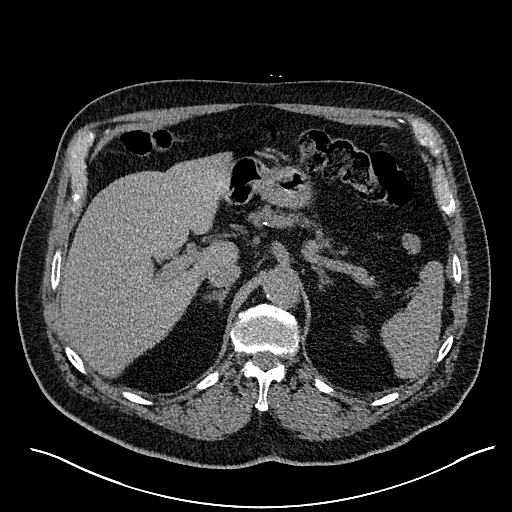
[im 12/155  lung]
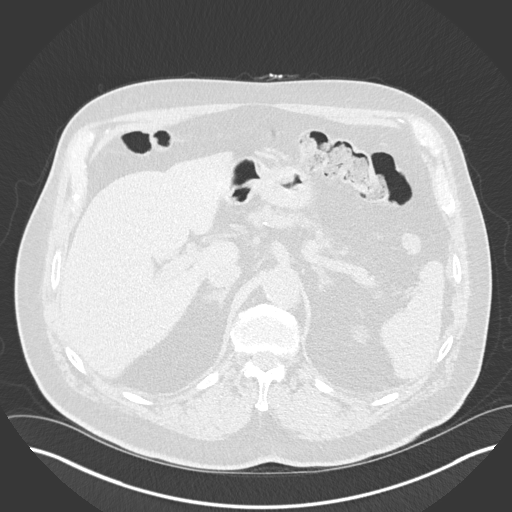
[im 23/155  lung]
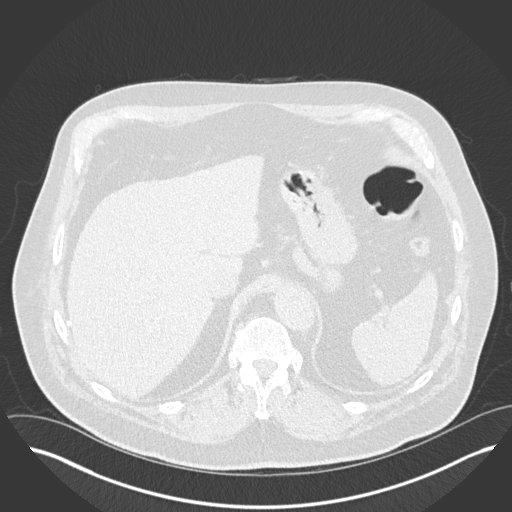
[im 35/155  lung]
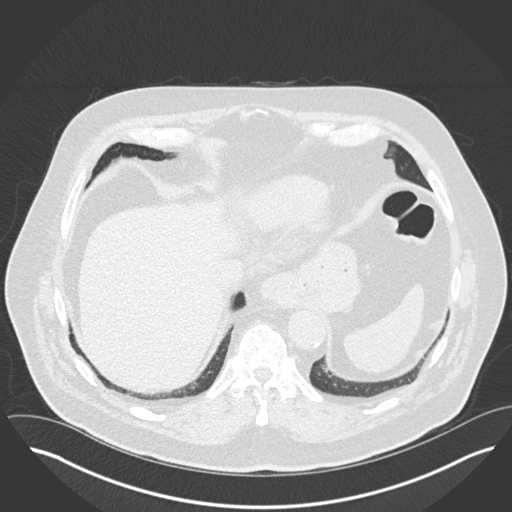
[im 52/155  lung]
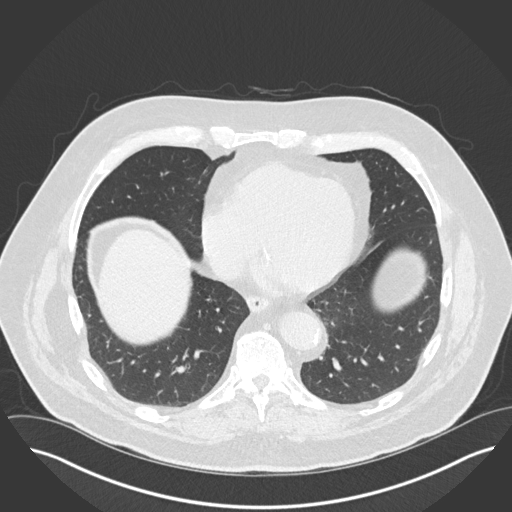
[im 63/155  mediastinal]
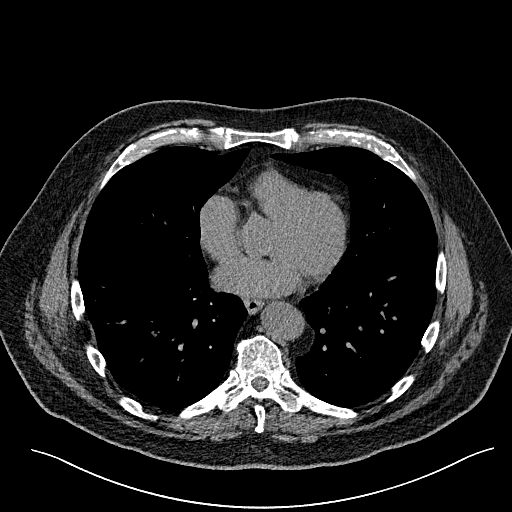
[im 63/155  lung]
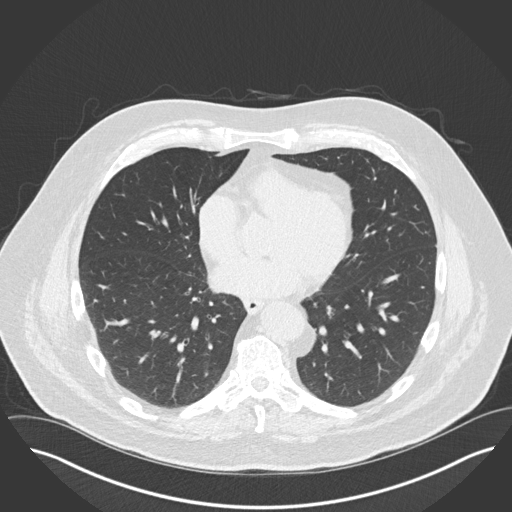
[im 80/155  lung]
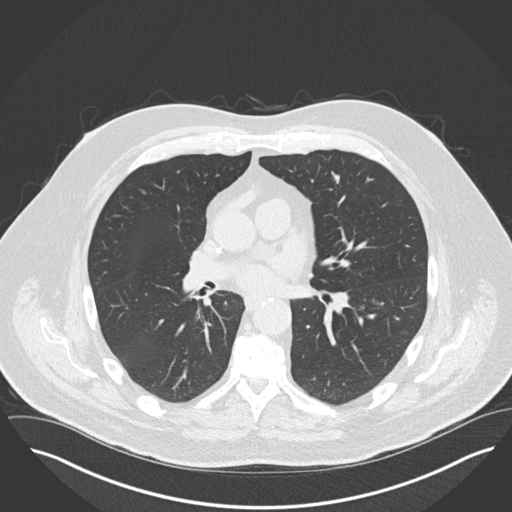
[im 92/155  lung]
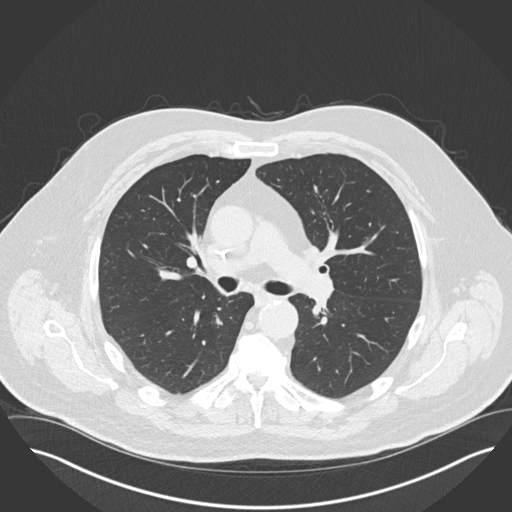
[im 103/155  lung]
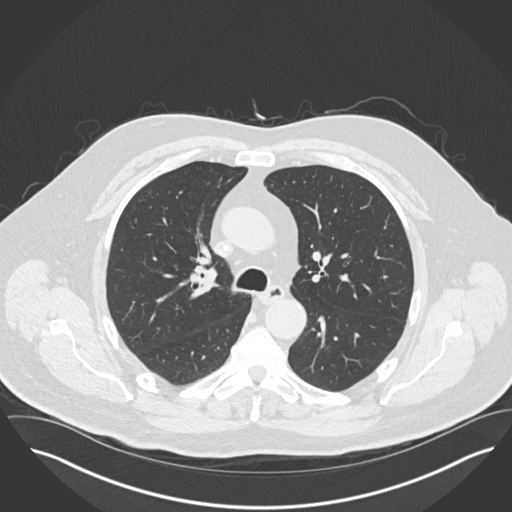
[im 120/155  mediastinal]
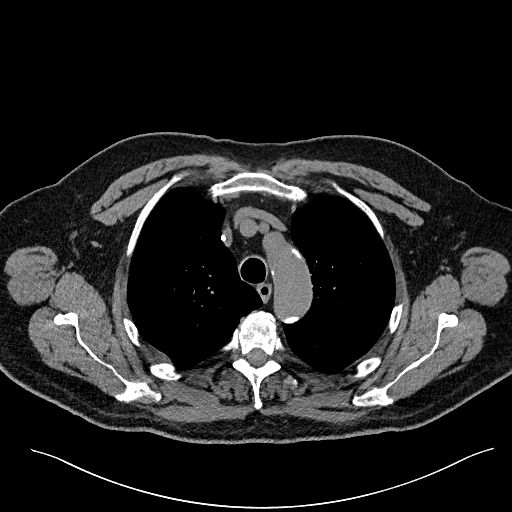
[im 120/155  lung]
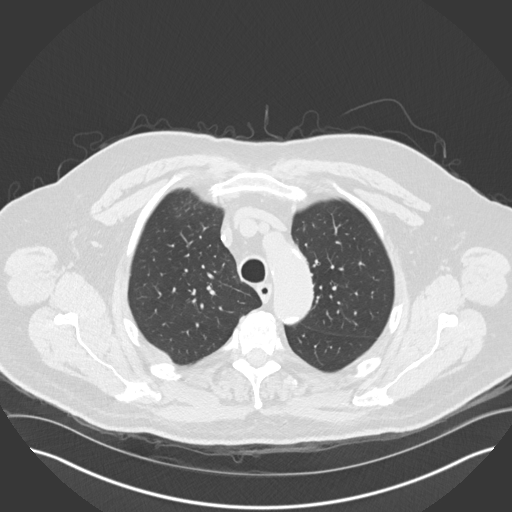
[im 132/155  lung]
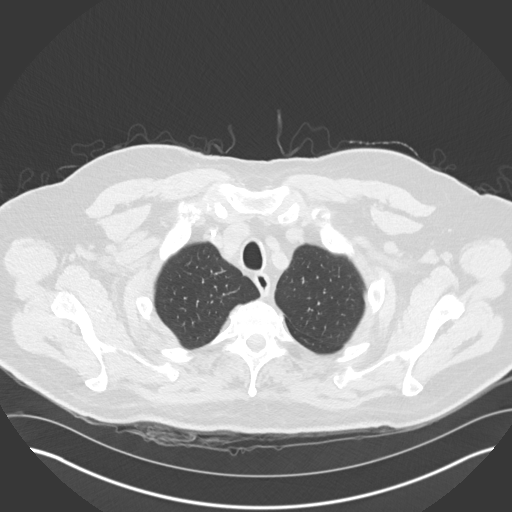
[im 143/155  lung]
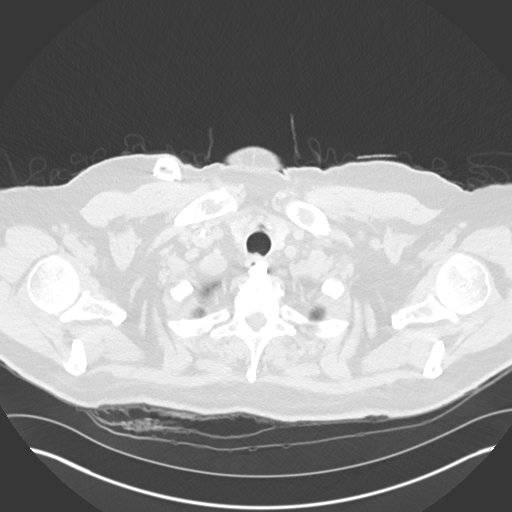

[Series 5: coronal · coronal · 0.63mm/px · 3 of 136 slices shown]
[im 28/136  lung]
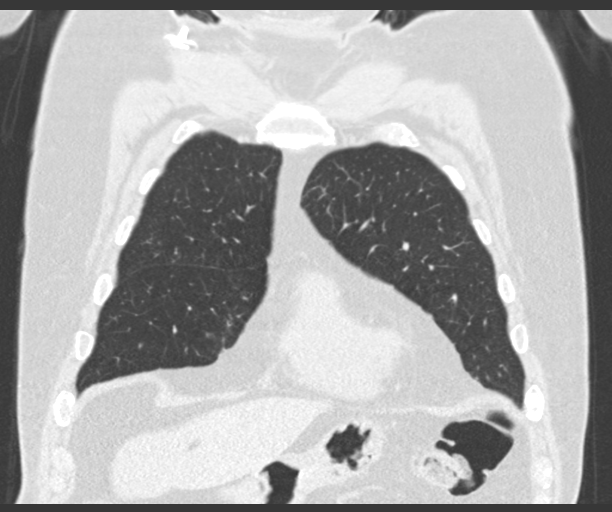
[im 55/136  lung]
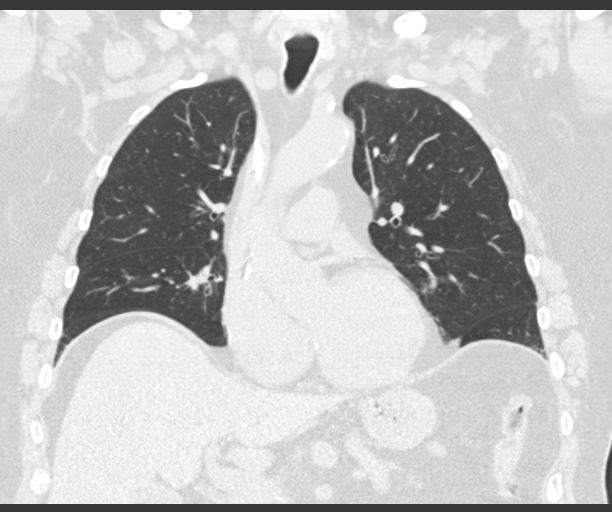
[im 82/136  lung]
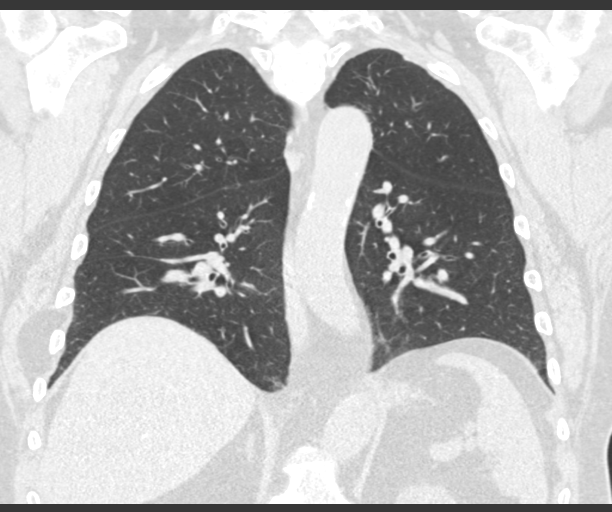

[14 of 36 positions shown; findings below may reference images not displayed]

FINDINGS: Cardiovascular: The thoracic aorta is somewhat ectatic, but there is
no demonstrable thoracic aortic aneurysm. There is atherosclerotic
calcification in the aorta. There is mild calcification in the
proximal left subclavian artery. The visualized great vessels
otherwise appear unremarkable on this noncontrast enhanced study.
There are foci of coronary artery calcification. The pericardium is
not thickened. Port-A-Cath tip is in the distal superior vena cava.

Mediastinum/Nodes: Thyroid appears unremarkable. There is no evident
adenopathy in the abdomen or pelvis.

Lungs/Pleura: There is no edema or consolidation. The previously
noted ground-glass opacity in the superior segment left lower lobe
is currently seen on slice 56 series 3. This opacity measures 8 x 6
mm, smaller compared to the previous study. There is a stable 2 mm
nodular opacity lateral segment of the left lower lobe currently
seen on slice 87 series 3. There is a stable 4 mm nodular opacity in
the lateral segment of the right middle lobe currently seen on axial
slice 95 series 3. All and axial slice 78 series 3, there is a 5 x 5
mm ground-glass appearing nodular opacity in the superior segment of
the right lower lobe, present previously but slightly better seen at
this time. Areas of patchy atelectasis in both lower lobes
posteriorly seen on the prior study of resolved. There is no pleural
effusion or pleural thickening.

Upper Abdomen: In the visualized upper abdomen, there is
cholelithiasis. There is atherosclerotic calcification in the
visualized upper abdomen.

Musculoskeletal: There is no chest wall lesion. The nodular opacity
seen on recent scout tomogram over the lower hemithorax on the left
is not evident on this study. There is mild degenerative change in
the thoracic spine. There is postoperative change in the lower
cervical region. There is degenerative change in each shoulder.
There are no blastic or lytic bone lesions.
IMPRESSION: No lesion is seen corresponding to the area of concern over the left
lower hemithorax seen on the scout tomogram of most recent study.
There is currently no chest wall lesion. There is no new parenchymal
lung lesion. The largest ground-glass nodular lesions seen on
previous study is smaller currently. As this lesion does persist,
recommendations from CT examination May 2016 remain in effect.
No new nodular opacities. No adenopathy. There is atherosclerotic
calcification at multiple sites including multiple foci of coronary
artery calcification.

## 2017-01-20 DIAGNOSIS — E291 Testicular hypofunction: Secondary | ICD-10-CM | POA: Diagnosis not present

## 2017-01-22 ENCOUNTER — Encounter (HOSPITAL_COMMUNITY): Payer: Self-pay

## 2017-01-22 ENCOUNTER — Encounter (HOSPITAL_COMMUNITY): Payer: Medicare Other | Attending: Hematology and Oncology

## 2017-01-22 VITALS — BP 156/90 | HR 72 | Temp 97.5°F | Resp 18

## 2017-01-22 DIAGNOSIS — Z452 Encounter for adjustment and management of vascular access device: Secondary | ICD-10-CM | POA: Diagnosis present

## 2017-01-22 DIAGNOSIS — C8319 Mantle cell lymphoma, extranodal and solid organ sites: Secondary | ICD-10-CM

## 2017-01-22 DIAGNOSIS — Z95828 Presence of other vascular implants and grafts: Secondary | ICD-10-CM

## 2017-01-22 DIAGNOSIS — C8310 Mantle cell lymphoma, unspecified site: Secondary | ICD-10-CM | POA: Insufficient documentation

## 2017-01-22 MED ORDER — HEPARIN SOD (PORK) LOCK FLUSH 100 UNIT/ML IV SOLN
INTRAVENOUS | Status: AC
  Filled 2017-01-22: qty 5

## 2017-01-22 MED ORDER — SODIUM CHLORIDE 0.9% FLUSH
10.0000 mL | INTRAVENOUS | Status: DC | PRN
  Administered 2017-01-22: 10 mL via INTRAVENOUS
  Filled 2017-01-22: qty 10

## 2017-01-22 MED ORDER — HEPARIN SOD (PORK) LOCK FLUSH 100 UNIT/ML IV SOLN
500.0000 [IU] | Freq: Once | INTRAVENOUS | Status: AC
  Administered 2017-01-22: 500 [IU] via INTRAVENOUS

## 2017-01-22 NOTE — Progress Notes (Signed)
[name] tolerated port flush well without complaints or incident. Port accessed with 20 gauge needle with good blood return noted then flushed with 10 ml NS and 5 ml Heparin easily per protocol. VSS Pt discharged self ambulatory in satisfactory condition

## 2017-01-22 NOTE — Patient Instructions (Signed)
[facility] at [facility] Discharge Instructions  RECOMMENDATIONS MADE BY THE CONSULTANT AND ANY TEST RESULTS WILL BE SENT TO YOUR REFERRING PHYSICIAN.  Portacath flushed per protocol today. Follow-up as scheduled. Call clinic for any questions or concerns  Thank you for choosing [facility] at [facility] to provide your oncology and hematology care.  To afford each patient quality time with our provider, please arrive at least 15 minutes before your scheduled appointment time.    If you have a lab appointment with the Cancer Center please come in thru the  Main Entrance and check in at the main information desk  You need to re-schedule your appointment should you arrive 10 or more minutes late.  We strive to give you quality time with our providers, and arriving late affects you and other patients whose appointments are after yours.  Also, if you no show three or more times for appointments you may be dismissed from the clinic at the providers discretion.     Again, thank you for choosing [facility].  Our hope is that these requests will decrease the amount of time that you wait before being seen by our physicians.       _____________________________________________________________  Should you have questions after your visit to [facility], please contact our office at [phone] between the hours of 8:30 a.m. and 4:30 p.m.  Voicemails left after 4:30 p.m. will not be returned until the following business day.  For prescription refill requests, have your pharmacy contact our office.       Resources For Cancer Patients and their Caregivers ? American Cancer Society: Can assist with transportation, wigs, general needs, runs Look Good Feel Better.        1-888-227-6333 ? Cancer Care: Provides financial assistance, online support groups, medication/co-pay assistance.  1-800-813-HOPE (4673) ? [facility] Assists [place] cancer patients and their families through emotional , educational and financial support.  336-427-4357 ? [place] DSS Where to apply for food stamps, Medicaid and utility assistance. 336-342-1394 ? RCATS: Transportation to medical appointments. 336-347-2287 ? Social Security Administration: May apply for disability if have a Stage IV cancer. 336-342-7796 1-800-772-1213 ? [place] Aging, Disability and Transit Services: Assists with nutrition, care and transit needs. 336-349-2343  Cancer Center Support Programs: @10RELATIVEDAYS@ > Cancer Support Group  2nd Tuesday of the month 1pm-2pm, Journey Room  > Creative Journey  3rd Tuesday of the month 1130am-1pm, Journey Room  > Look Good Feel Better  1st Wednesday of the month 10am-12 noon, Journey Room (Call American Cancer Society to register 1-800-395-5775)   

## 2017-01-23 ENCOUNTER — Encounter (HOSPITAL_COMMUNITY): Payer: Medicare Other

## 2017-01-23 DIAGNOSIS — I129 Hypertensive chronic kidney disease with stage 1 through stage 4 chronic kidney disease, or unspecified chronic kidney disease: Secondary | ICD-10-CM | POA: Diagnosis not present

## 2017-01-23 DIAGNOSIS — Z888 Allergy status to other drugs, medicaments and biological substances status: Secondary | ICD-10-CM | POA: Diagnosis not present

## 2017-01-23 DIAGNOSIS — G250 Essential tremor: Secondary | ICD-10-CM | POA: Diagnosis not present

## 2017-01-23 DIAGNOSIS — Z91030 Bee allergy status: Secondary | ICD-10-CM | POA: Diagnosis not present

## 2017-01-23 DIAGNOSIS — K219 Gastro-esophageal reflux disease without esophagitis: Secondary | ICD-10-CM | POA: Diagnosis not present

## 2017-01-23 DIAGNOSIS — Z794 Long term (current) use of insulin: Secondary | ICD-10-CM | POA: Diagnosis not present

## 2017-01-23 DIAGNOSIS — Z885 Allergy status to narcotic agent status: Secondary | ICD-10-CM | POA: Diagnosis not present

## 2017-01-23 DIAGNOSIS — Z91040 Latex allergy status: Secondary | ICD-10-CM | POA: Diagnosis not present

## 2017-01-23 DIAGNOSIS — Z91041 Radiographic dye allergy status: Secondary | ICD-10-CM | POA: Diagnosis not present

## 2017-01-23 DIAGNOSIS — Z9079 Acquired absence of other genital organ(s): Secondary | ICD-10-CM | POA: Diagnosis not present

## 2017-01-23 DIAGNOSIS — N393 Stress incontinence (female) (male): Secondary | ICD-10-CM | POA: Diagnosis not present

## 2017-01-23 DIAGNOSIS — N529 Male erectile dysfunction, unspecified: Secondary | ICD-10-CM | POA: Diagnosis not present

## 2017-01-23 DIAGNOSIS — N183 Chronic kidney disease, stage 3 (moderate): Secondary | ICD-10-CM | POA: Diagnosis not present

## 2017-01-23 DIAGNOSIS — Z8546 Personal history of malignant neoplasm of prostate: Secondary | ICD-10-CM | POA: Diagnosis not present

## 2017-01-23 DIAGNOSIS — E1122 Type 2 diabetes mellitus with diabetic chronic kidney disease: Secondary | ICD-10-CM | POA: Diagnosis not present

## 2017-01-23 DIAGNOSIS — N4889 Other specified disorders of penis: Secondary | ICD-10-CM | POA: Diagnosis not present

## 2017-01-23 DIAGNOSIS — E538 Deficiency of other specified B group vitamins: Secondary | ICD-10-CM | POA: Diagnosis not present

## 2017-01-23 HISTORY — PX: URINARY SPHINCTER IMPLANT: SHX2624

## 2017-01-24 DIAGNOSIS — N4889 Other specified disorders of penis: Secondary | ICD-10-CM | POA: Diagnosis not present

## 2017-01-24 DIAGNOSIS — N393 Stress incontinence (female) (male): Secondary | ICD-10-CM | POA: Diagnosis not present

## 2017-01-24 DIAGNOSIS — E538 Deficiency of other specified B group vitamins: Secondary | ICD-10-CM | POA: Diagnosis not present

## 2017-01-24 DIAGNOSIS — N183 Chronic kidney disease, stage 3 (moderate): Secondary | ICD-10-CM | POA: Diagnosis not present

## 2017-01-24 DIAGNOSIS — I129 Hypertensive chronic kidney disease with stage 1 through stage 4 chronic kidney disease, or unspecified chronic kidney disease: Secondary | ICD-10-CM | POA: Diagnosis not present

## 2017-01-24 DIAGNOSIS — N529 Male erectile dysfunction, unspecified: Secondary | ICD-10-CM | POA: Diagnosis not present

## 2017-01-30 DIAGNOSIS — E119 Type 2 diabetes mellitus without complications: Secondary | ICD-10-CM | POA: Diagnosis not present

## 2017-02-03 DIAGNOSIS — E291 Testicular hypofunction: Secondary | ICD-10-CM | POA: Diagnosis not present

## 2017-02-18 DIAGNOSIS — E291 Testicular hypofunction: Secondary | ICD-10-CM | POA: Diagnosis not present

## 2017-02-28 ENCOUNTER — Ambulatory Visit (HOSPITAL_COMMUNITY): Payer: Medicare Other

## 2017-03-04 ENCOUNTER — Encounter (HOSPITAL_COMMUNITY): Payer: Medicare Other | Attending: Hematology and Oncology | Admitting: Oncology

## 2017-03-04 ENCOUNTER — Encounter (HOSPITAL_BASED_OUTPATIENT_CLINIC_OR_DEPARTMENT_OTHER): Payer: Medicare Other

## 2017-03-04 ENCOUNTER — Encounter (HOSPITAL_COMMUNITY): Payer: Self-pay | Admitting: Oncology

## 2017-03-04 VITALS — BP 135/82 | HR 70 | Temp 98.3°F | Resp 16 | Ht 71.0 in | Wt 235.0 lb

## 2017-03-04 DIAGNOSIS — C8310 Mantle cell lymphoma, unspecified site: Secondary | ICD-10-CM

## 2017-03-04 DIAGNOSIS — C61 Malignant neoplasm of prostate: Secondary | ICD-10-CM

## 2017-03-04 DIAGNOSIS — E538 Deficiency of other specified B group vitamins: Secondary | ICD-10-CM

## 2017-03-04 DIAGNOSIS — C8319 Mantle cell lymphoma, extranodal and solid organ sites: Secondary | ICD-10-CM

## 2017-03-04 DIAGNOSIS — Z95828 Presence of other vascular implants and grafts: Secondary | ICD-10-CM

## 2017-03-04 DIAGNOSIS — E291 Testicular hypofunction: Secondary | ICD-10-CM | POA: Diagnosis not present

## 2017-03-04 LAB — COMPREHENSIVE METABOLIC PANEL
ALT: 21 U/L (ref 17–63)
ANION GAP: 10 (ref 5–15)
AST: 24 U/L (ref 15–41)
Albumin: 4 g/dL (ref 3.5–5.0)
Alkaline Phosphatase: 54 U/L (ref 38–126)
BILIRUBIN TOTAL: 1 mg/dL (ref 0.3–1.2)
BUN: 25 mg/dL — ABNORMAL HIGH (ref 6–20)
CO2: 28 mmol/L (ref 22–32)
Calcium: 8.9 mg/dL (ref 8.9–10.3)
Chloride: 98 mmol/L — ABNORMAL LOW (ref 101–111)
Creatinine, Ser: 1.54 mg/dL — ABNORMAL HIGH (ref 0.61–1.24)
GFR, EST AFRICAN AMERICAN: 48 mL/min — AB (ref >60)
GFR, EST NON AFRICAN AMERICAN: 41 mL/min — AB (ref >60)
Glucose, Bld: 278 mg/dL — ABNORMAL HIGH (ref 65–99)
POTASSIUM: 4.2 mmol/L (ref 3.5–5.1)
Sodium: 136 mmol/L (ref 135–145)
TOTAL PROTEIN: 6.7 g/dL (ref 6.5–8.1)

## 2017-03-04 LAB — CBC WITH DIFFERENTIAL/PLATELET
BASOS PCT: 0 %
Basophils Absolute: 0 10*3/uL (ref 0.0–0.1)
Eosinophils Absolute: 0.1 10*3/uL (ref 0.0–0.7)
Eosinophils Relative: 2 %
HEMATOCRIT: 44.5 % (ref 39.0–52.0)
Hemoglobin: 14.8 g/dL (ref 13.0–17.0)
Lymphocytes Relative: 16 %
Lymphs Abs: 1.1 10*3/uL (ref 0.7–4.0)
MCH: 28.7 pg (ref 26.0–34.0)
MCHC: 33.3 g/dL (ref 30.0–36.0)
MCV: 86.4 fL (ref 78.0–100.0)
MONOS PCT: 5 %
Monocytes Absolute: 0.3 10*3/uL (ref 0.1–1.0)
NEUTROS ABS: 5.5 10*3/uL (ref 1.7–7.7)
Neutrophils Relative %: 78 %
Platelets: 108 10*3/uL — ABNORMAL LOW (ref 150–400)
RBC: 5.15 MIL/uL (ref 4.22–5.81)
RDW: 17.2 % — AB (ref 11.5–15.5)
WBC: 7 10*3/uL (ref 4.0–10.5)

## 2017-03-04 LAB — LACTATE DEHYDROGENASE: LDH: 132 U/L (ref 98–192)

## 2017-03-04 MED ORDER — HEPARIN SOD (PORK) LOCK FLUSH 100 UNIT/ML IV SOLN
500.0000 [IU] | Freq: Once | INTRAVENOUS | Status: AC
  Administered 2017-03-04: 500 [IU] via INTRAVENOUS

## 2017-03-04 MED ORDER — CYANOCOBALAMIN 1000 MCG/ML IJ SOLN
INTRAMUSCULAR | Status: AC
  Filled 2017-03-04: qty 1

## 2017-03-04 MED ORDER — HEPARIN SOD (PORK) LOCK FLUSH 100 UNIT/ML IV SOLN
INTRAVENOUS | Status: AC
  Filled 2017-03-04: qty 5

## 2017-03-04 MED ORDER — CYANOCOBALAMIN 1000 MCG/ML IJ SOLN
1000.0000 ug | Freq: Once | INTRAMUSCULAR | Status: AC
  Administered 2017-03-04: 1000 ug via INTRAMUSCULAR

## 2017-03-04 MED ORDER — SODIUM CHLORIDE 0.9% FLUSH
20.0000 mL | INTRAVENOUS | Status: DC | PRN
  Administered 2017-03-04: 20 mL via INTRAVENOUS
  Filled 2017-03-04: qty 20

## 2017-03-04 NOTE — Patient Instructions (Signed)
[place] at [facility] Discharge Instructions  RECOMMENDATIONS MADE BY THE CONSULTANT AND ANY TEST RESULTS WILL BE SENT TO YOUR REFERRING PHYSICIAN.  You were seen today by Dr. Oliva Bustard. Return in 4 months for labs and follow up.   Thank you for choosing [place] at [facility] to provide your oncology and hematology care.  To afford each patient quality time with our provider, please arrive at least 15 minutes before your scheduled appointment time.    If you have a lab appointment with the [place] please come in thru the  Main Entrance and check in at the main information desk  You need to re-schedule your appointment should you arrive 10 or more minutes late.  We strive to give you quality time with our providers, and arriving late affects you and other patients whose appointments are after yours.  Also, if you no show three or more times for appointments you may be dismissed from the clinic at the providers discretion.     Again, thank you for choosing [facility].  Our hope is that these requests will decrease the amount of time that you wait before being seen by our physicians.       _____________________________________________________________  Should you have questions after your visit to [facility], please contact our office at [phone] between the hours of 8:30 a.m. and 4:30 p.m.  Voicemails left after 4:30 p.m. will not be returned until the following business day.  For prescription refill requests, have your pharmacy contact our office.       Resources For Cancer Patients and their Caregivers ? American Cancer Society: Can assist with transportation, wigs, general needs, runs Look Good Feel Better.        605-504-6193 ? Cancer Care: Provides financial assistance, online support groups, medication/co-pay assistance.  1-800-813-HOPE 253-536-3472) ? [place] Assists  [place] cancer patients and their families through emotional , educational and financial support.  5597530437 ? [place] DSS Where to apply for food stamps, Medicaid and utility assistance. 775-508-7723 ? RCATS: Transportation to medical appointments. 678-311-1954 ? Social Security Administration: May apply for disability if have a Stage IV cancer. 309-442-2729 (470)715-1350 ? [employer], Disability and Transit Services: Assists with nutrition, care and transit needs. [employer] [place] Support Programs: @10RELATIVEDAYS @ > Cancer Support Group  2nd Tuesday of the month 1pm-2pm, Journey Room  > Creative Journey  3rd Tuesday of the month 1130am-1pm, Journey Room  > Look Good Feel Better  1st Wednesday of the month 10am-12 noon, Journey Room (Call [place] to register [phone])

## 2017-03-04 NOTE — Progress Notes (Signed)
[name], [place] [address]  No diagnosis found.  CURRENT THERAPY: Surveillance per NCCN guidelines  INTERVAL HISTORY: [name] 82 y.o. male returns for followup of Mantle Cell lymphoma of the large intestine diagnosed 08/2013, S/P BR x 6 cycles from 09/14/2013- 02/07/2014, followed by maintenance Rituxan 05/10/2014- 04/01/2016. Colonoscopy on 02/28/2014 by Dr. Laural Golden demonstrated NED.     Mantle cell lymphoma ([place])   08/27/2013 Initial Diagnosis    Colon, biopsy, random - MANTLE CELL LYMPHOMA      09/06/2013 Imaging    CT CAP- No significant lymphadenopathy identified within the chest, abdomen or pelvis.      09/14/2013 - 02/07/2014 Chemotherapy    Bendamustine/Rituxan x 6 cycles with Neulasta support      12/17/2013 Procedure    Colonoscopy with biopsy by Dr. Laural Golden.      12/17/2013 Pathology Results    Colon, biopsy, ascending and transverse - BENIGN APPEARING COLONIC MUCOSA WITH SCATTERED ATYPICAL LYMPHOCYTES.Overall, these findings are suspicious for minimal residual mantle cell lymphoma.      02/28/2014 Procedure    Colonoscopy with biopsy by Dr. Laural Golden      02/28/2014 Pathology Results    Colon, biopsy, proximal and distal - BENIGN COLONIC MUCOSA. - NO SIGNIFICANT INFLAMMATION OR OTHER ABNORMALITIES IDENTIFIED. - NO EVIDENCE OF LYMPHOMA, ADENOMATOUS CHANGES OR MALIGNANCY.      02/28/2014 Remission    No evidence of malignancy on colonosocpy and pathology from biopsy.      05/10/2014 - 04/01/2016 Chemotherapy    Maintenance Rituxan 500 mg/m x 2 years.      05/11/2015 Imaging    No definite findings to suggest metastatic disease in chest, abdomen or pelvis, new ground glass atten in lungs B, nonspecific, repeat 6 to 12 mo      06/30/2015 - 07/02/2015 Hospital Admission    [facility]      08/04/2015 Procedure    Colonoscopy with biopsy by Dr. Laural Golden.      08/04/2015 Pathology Results    Colon, biopsy, right and left. - BENIGN  COLORECTAL MUCOSA. - ASSOCIATED BENIGN LYMPHOID AGGREGATES. - NO EVIDENCE OF SIGNIFICANT INFLAMMATION, DYSPLASIA OR MALIGNANCY.      05/10/2016 Imaging    CT CAP- 1. No definite findings to suggest metastatic disease in the chest, abdomen or pelvis. There are several new patchy areas of ground-glass attenuation in the lungs bilaterally which are highly nonspecific. The possibility of developing interstitial lung disease is not excluded, and repeat high-resolution chest CT is suggested in 6-12 months to assess for temporal changes in the appearance of the lung parenchyma.       06/17/2016 Imaging    CT chest high resolution- 1. No convincing findings of interstitial lung disease. Minimal subpleural reticulation and ground-glass attenuation in the dependent lower lobes is not significantly changed since 05/11/2015, and appears slightly less prominent on the inspiratory sequence, suggesting a combination of mild hypoventilatory change and minimal nonspecific scarring. No traction bronchiectasis or frank honeycombing. 2. Two tiny solid pulmonary nodules in the right middle lobe and left lower lobe are stable and probably benign. 3. Left lower lobe ground-glass 11 mm nodule is stable. Given persistence, repeat CT is recommended every 2 years until 5 years of stability has been established. This recommendation follows the consensus statement: Guidelines for Management of Incidental Pulmonary Nodules Detected on CT Images: From the Fleischner Society 2017; Radiology 2017; 284:228-243. 4. Additional findings include aortic atherosclerosis and coronary  atherosclerosis.      08/19/2016 Imaging    CT abd/pelvis- 1. Tiny nonobstructive stones in the right kidney. No cause for left flank pain identified. 2. Diverticulosis without focal diverticulitis. 3. Atherosclerosis. 4. Rounded density over the left lower chest on the scout view only, not seen on the June 17, 2016 CT is likely  something on the patient's such as an EKG lead. Recommend clinical correlation. 5. Cholelithiasis.       [name] presents today unaccompanied for a follow up. I personally reviewed and went over labs with the patient.  He states he has been doing well.  He notes he had stomach pain and nausea in January. It has since been resolved.   Denies chest pain, sob, abdominal pain, drenching night sweats, unexplained weight loss, fatigue.  He states his port has not been bothering him.   He does not have any other questions or concerns at this time. The patient is here for further follow-up remains asymptomatic.  Had a recent surgery done for incontinence of urine  Review of Systems  Constitutional: Negative.  Negative for malaise/fatigue and weight loss.       Denies drenching night sweats.  HENT: Negative.   Eyes: Negative.   Respiratory: Negative.  Negative for shortness of breath.   Cardiovascular: Negative.  Negative for chest pain.  Gastrointestinal: Negative.  Negative for abdominal pain.  Genitourinary: Negative.   Musculoskeletal: Negative.   Skin: Negative.   Neurological: Negative.   Endo/Heme/Allergies: Negative.   Psychiatric/Behavioral: Negative.   All other systems reviewed and are negative.   Past Medical History:  Diagnosis Date  . B12 deficiency 02/07/2014  . Colon cancer ([place])   . 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 Essential hypertension, benign   . GERD (gastroesophageal reflux disease)   . Mixed hyperlipidemia   . Obstructive sleep apnea    does not use  . Prostate cancer ([place])   . PVD (peripheral vascular disease) ([place])   . Type 2 diabetes mellitus ([place])     Past Surgical History:  Procedure Laterality Date  . BACK SURGERY    . BALLOON DILATION N/A 08/27/2013   Procedure: BALLOON DILATION;  Surgeon: Rogene Houston, MD;  [facility];   Service: Endoscopy;  Laterality: N/A;  . COLON SURGERY    . COLONOSCOPY N/A 12/17/2013   Procedure: COLONOSCOPY;  Surgeon: Rogene Houston, MD;  [facility];  Service: Endoscopy;  Laterality: N/A;  940  . COLONOSCOPY N/A 02/28/2014   Procedure: COLONOSCOPY;  Surgeon: Rogene Houston, MD;  [facility];  Service: Endoscopy;  Laterality: N/A;  730  . COLONOSCOPY N/A 08/04/2015   Procedure: COLONOSCOPY;  Surgeon: Rogene Houston, MD;  [facility];  Service: Endoscopy;  Laterality: N/A;  200 - moved to 11/11 @ 2:10 - Ann to notify pt  . COLONOSCOPY WITH ESOPHAGOGASTRODUODENOSCOPY (EGD) N/A 08/27/2013   Procedure: COLONOSCOPY WITH ESOPHAGOGASTRODUODENOSCOPY (EGD);  Surgeon: Rogene Houston, MD;  [facility];  Service: Endoscopy;  Laterality: N/A;  730  . KNEE ARTHROSCOPY Left   . KNEE SURGERY Right    total knee  . MALONEY DILATION N/A 08/27/2013   Procedure: Venia Minks DILATION;  Surgeon: Rogene Houston, MD;  [facility];  Service: Endoscopy;  Laterality: N/A;  . PORTACATH PLACEMENT Right 2014  . PROSTATECTOMY    .  removal of port Right   . SAVORY DILATION N/A 08/27/2013   Procedure: SAVORY DILATION;  Surgeon: Rogene Houston, MD;  [facility];  Service: Endoscopy;  Laterality: N/A;  . SHOULDER SURGERY    . TONSILLECTOMY    . VASECTOMY      Family History  Problem Relation Age of Onset  . Colon cancer Brother   . Cancer Brother   . Diabetes Father   . Cancer Brother     Social History   Social History  . Marital status: Married    Spouse name: [name]  . Number of children: 2  . Years of education: 8   Occupational History  . Retired  Retired   Social History Main Topics  . Smoking status: Never Smoker  . Smokeless tobacco: Never Used  . Alcohol use No  . Drug use: No  . Sexual activity: Yes    Birth control/ protection: None   Other Topics Concern  . Not on file   Social History Narrative   Lives with  wife   Caffeine use: none   Right-handed     PHYSICAL EXAMINATION  ECOG PERFORMANCE STATUS: 1 - Symptomatic but completely ambulatory   There were no vitals filed for this visit. There were no vitals filed for this visit.  Physical Exam  Constitutional: He is oriented to person, place, and time and well-developed, well-nourished, and in no distress.  HENT:  Head: Normocephalic and atraumatic.  Eyes: Conjunctivae and EOM are normal. Pupils are equal, round, and reactive to light.  Neck: Normal range of motion. Neck supple.  Cardiovascular: Normal rate, regular rhythm and normal heart sounds.   Pulmonary/Chest: Effort normal and breath sounds normal.  Abdominal: Soft. Bowel sounds are normal.  Musculoskeletal: Normal range of motion.  Neurological: He is alert and oriented to person, place, and time. Gait normal.  Skin: Skin is warm and dry.  Nursing note and vitals reviewed.    LABORATORY DATA: CBC    Component Value Date/Time   WBC 6.9 11/28/2016 1111   RBC 4.79 11/28/2016 1111   HGB 14.3 11/28/2016 1111   HCT 43.6 11/28/2016 1111   PLT 96 (L) 11/28/2016 1111   MCV 91.0 11/28/2016 1111   MCH 29.9 11/28/2016 1111   MCHC 32.8 11/28/2016 1111   RDW 15.9 (H) 11/28/2016 1111   LYMPHSABS 1.0 11/28/2016 1111   MONOABS 0.4 11/28/2016 1111   EOSABS 0.1 11/28/2016 1111   BASOSABS 0.0 11/28/2016 1111      Chemistry      Component Value Date/Time   NA 136 11/28/2016 1111   K 4.0 11/28/2016 1111   CL 100 (L) 11/28/2016 1111   CO2 28 11/28/2016 1111   BUN 23 (H) 11/28/2016 1111   CREATININE 1.91 (H) 11/28/2016 1111   CREATININE 1.17 11/28/2014 0955      Component Value Date/Time   CALCIUM 8.5 (L) 11/28/2016 1111   ALKPHOS 52 11/28/2016 1111   AST 17 11/28/2016 1111   ALT 15 (L) 11/28/2016 1111   BILITOT 1.1 11/28/2016 1111        PENDING LABS:   RADIOGRAPHIC STUDIES: I have personally reviewed the radiological images as listed and agreed with the findings  in the report. No results found.  CT CHEST WITHOUT CONTRAST 09/09/2016  IMPRESSION: No lesion is seen corresponding to the area of concern over the left lower hemithorax seen on the scout tomogram of most recent study. There is currently no chest wall lesion. There is  no new parenchymal lung lesion. The largest ground-glass nodular lesions seen on previous study is smaller currently. As this lesion does persist, recommendations from CT examination September 2017 remain in effect. No new nodular opacities. No adenopathy. There is atherosclerotic calcification at multiple sites including multiple foci of coronary artery calcification.   NUCLEAR MEDICINE GASTRIC EMPTYING SCAN 09/27/2016  IMPRESSION: Normal gastric emptying study.   PATHOLOGY:    ASSESSMENT AND PLAN:  Mantle Cell lymphoma of the large intestine diagnosed 08/2013, S/P BR x 6 cycles from 09/14/2013- 02/07/2014, followed by maintenance Rituxan 05/10/2014- 04/01/2016.  PLAN: Clinically NED. Labs reviewed. Results noted above.  He will have his port flushed today. He will get CBC, CMP, and LDH labs drawn today and on his next visit.    RTC in 3 months with repeat labs.  THERAPY PLAN:  Surveillance for Mantle Cell Lymphoma following complete response to therapy per NCCN guidelines (4.2017):  A. Clinical follow-up every 3-6 months for 5 years and then annually, or as clinically indicated.   There is no clinical evidence of recurrent or progressive disease.  Lab data is pending  I have reviewed the above documentation for accuracy and completeness and I agree with the above.  This note is electronically signed by: Forest Gleason, MD 03/04/2017 1:53 PM

## 2017-03-04 NOTE — Progress Notes (Signed)
Jarold Motto Tallie tolerated Vit B12 injection and port flush with lab draw well without complaints or incident. Port accessed with 20 gauge needle with blood drawn for labs then flushed with 20 ml NS and 5 ml Heparin easily per protocol then de-accessed. Pt discharged self ambulatory in satisfactory condition

## 2017-03-04 NOTE — Patient Instructions (Signed)
[name] at [facility] Discharge Instructions  RECOMMENDATIONS MADE BY THE CONSULTANT AND ANY TEST RESULTS WILL BE SENT TO YOUR REFERRING PHYSICIAN.  Received Vit B12 injection and port flushed per protocol after blood drawn for labs today. Follow-up as scheduled. Call clinic for any questions or concerns  Thank you for choosing [place] at [facility] to provide your oncology and hematology care.  To afford each patient quality time with our provider, please arrive at least 15 minutes before your scheduled appointment time.    If you have a lab appointment with the [place] please come in thru the  Main Entrance and check in at the main information desk  You need to re-schedule your appointment should you arrive 10 or more minutes late.  We strive to give you quality time with our providers, and arriving late affects you and other patients whose appointments are after yours.  Also, if you no show three or more times for appointments you may be dismissed from the clinic at the providers discretion.     Again, thank you for choosing [facility].  Our hope is that these requests will decrease the amount of time that you wait before being seen by our physicians.       _____________________________________________________________  Should you have questions after your visit to [facility], please contact our office at [phone] between the hours of 8:30 a.m. and 4:30 p.m.  Voicemails left after 4:30 p.m. will not be returned until the following business day.  For prescription refill requests, have your pharmacy contact our office.       Resources For Cancer Patients and their Caregivers ? American Cancer Society: Can assist with transportation, wigs, general needs, runs Look Good Feel Better.        217-196-0988 ? Cancer Care: Provides financial assistance, online support groups, medication/co-pay  assistance.  1-800-813-HOPE (478)605-3729) ? [place] Assists [place] cancer patients and their families through emotional , educational and financial support.  ([phone] ? [place] DSS Where to apply for food stamps, Medicaid and utility assistance. 905-323-2879 ? RCATS: Transportation to medical appointments. 249-171-3291 ? Social Security Administration: May apply for disability if have a Stage IV cancer. (647) 439-6567 [phone])633-1892 ? [employer], Disability and Transit Services: Assists with nutrition, care and transit needs. [place] Support Programs: @10RELATIVEDAYS @ > Cancer Support Group  2nd Tuesday of the month 1pm-2pm, Journey Room  > Creative Journey  3rd Tuesday of the month 1130am-1pm, Journey Room  > Look Good Feel Better  1st Wednesday of the month 10am-12 noon, Journey Room (Call [place] to register [phone])

## 2017-03-17 ENCOUNTER — Ambulatory Visit (INDEPENDENT_AMBULATORY_CARE_PROVIDER_SITE_OTHER): Payer: Medicare Other | Admitting: Nurse Practitioner

## 2017-03-17 ENCOUNTER — Telehealth: Payer: Self-pay | Admitting: *Deleted

## 2017-03-17 ENCOUNTER — Encounter: Payer: Self-pay | Admitting: Nurse Practitioner

## 2017-03-17 VITALS — BP 120/80 | HR 61 | Ht 71.0 in | Wt 237.8 lb

## 2017-03-17 DIAGNOSIS — M79675 Pain in left toe(s): Secondary | ICD-10-CM | POA: Diagnosis not present

## 2017-03-17 DIAGNOSIS — I259 Chronic ischemic heart disease, unspecified: Secondary | ICD-10-CM

## 2017-03-17 DIAGNOSIS — E7800 Pure hypercholesterolemia, unspecified: Secondary | ICD-10-CM | POA: Diagnosis not present

## 2017-03-17 DIAGNOSIS — I10 Essential (primary) hypertension: Secondary | ICD-10-CM | POA: Diagnosis not present

## 2017-03-17 DIAGNOSIS — E1142 Type 2 diabetes mellitus with diabetic polyneuropathy: Secondary | ICD-10-CM | POA: Diagnosis not present

## 2017-03-17 DIAGNOSIS — B351 Tinea unguium: Secondary | ICD-10-CM | POA: Diagnosis not present

## 2017-03-17 MED ORDER — AMLODIPINE BESYLATE 5 MG PO TABS: 5.0000 mg | ORAL_TABLET | Freq: Every day | ORAL | 6 refills | Status: DC

## 2017-03-17 MED ORDER — AMLODIPINE BESYLATE 5 MG PO TABS: 5.0000 mg | ORAL_TABLET | Freq: Every day | ORAL | 3 refills | Status: DC

## 2017-03-17 NOTE — Progress Notes (Signed)
CARDIOLOGY OFFICE NOTE  Date:  03/17/2017    [name] Date of Birth: 09-Nov-1934 Medical Record #[id number]  PCP:  Celene Squibb, MD  Cardiologist:  Servando Snare   Chief Complaint  Patient presents with  . Coronary Artery Disease    6 month check    History of Present Illness: [name] is a 82 y.o. male who presents today for a 6 month check. Former patient of Dr. Maren Beach. Last seen in May of 2014 by Dr. Domenic Polite. Basically follows with me.   He has known CAD with no prior history of PCI. Other issues include colon cancer, HTN, GERD, HLD, PVDand OSA. Remote stress test from 2008. Reported echocardiogram from April 2011 noted upper normal LV wall thickness with LVEF 82-99%, grade 2 diastolic dysfunction, trivial aortic regurgitation, mild biatrial enlargement.  I saw him back in October of 2017 after a 3 year absence - he was needing left hand surgery and needing clearance. He had not been back here "because nothing is wrong with me". He was off most of his medicines other than Inderal and his med list did not match his PCP's. BP high here and at home. No symptoms. I put him on Norvasc - I was not convinced that he would stay on long term. Last seen in December - history remained pretty hard to follow. Chronic DOE. Cardiac status seemed stable.   Comes in today. Here alone. Says he is doing ok. No chest pain. His breathing is stable - stable DOE. Walking every day - probably 2 miles a day - says he "does not see where this helps him". Not on statin - recent labs noted - not too bad. Sugars running high. His weight loss stopped - his weight is now back up. He still does not seem to know his medicines. His med list does not match up again. BP is good here today - looks like he probably ran out of his Norvasc though - very unclear to me. Regardless, he says he feels ok and has no real issue.   Past Medical History:  Diagnosis Date  . B12 deficiency 02/07/2014  .  Colon cancer ([place])   . 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 Essential hypertension, benign   . GERD (gastroesophageal reflux disease)   . Mixed hyperlipidemia   . Obstructive sleep apnea    does not use  . Prostate cancer ([place])   . PVD (peripheral vascular disease) ([place])   . Type 2 diabetes mellitus ([place])     Past Surgical History:  Procedure Laterality Date  . BACK SURGERY    . BALLOON DILATION N/A 08/27/2013   Procedure: BALLOON DILATION;  Surgeon: Rogene Houston, MD;  [facility];  Service: Endoscopy;  Laterality: N/A;  . COLON SURGERY    . COLONOSCOPY N/A 12/17/2013   Procedure: COLONOSCOPY;  Surgeon: Rogene Houston, MD;  [facility];  Service: Endoscopy;  Laterality: N/A;  940  . COLONOSCOPY N/A 02/28/2014   Procedure: COLONOSCOPY;  Surgeon: Rogene Houston, MD;  [facility];  Service: Endoscopy;  Laterality: N/A;  730  . COLONOSCOPY N/A 08/04/2015   Procedure: COLONOSCOPY;  Surgeon: Rogene Houston, MD;  [facility];  Service: Endoscopy;  Laterality: N/A;  200 - moved to 11/11 @ 2:10 - Ann to notify pt  . COLONOSCOPY WITH  ESOPHAGOGASTRODUODENOSCOPY (EGD) N/A 08/27/2013   Procedure: COLONOSCOPY WITH ESOPHAGOGASTRODUODENOSCOPY (EGD);  Surgeon: Rogene Houston, MD;  [facility];  Service: Endoscopy;  Laterality: N/A;  730  . KNEE ARTHROSCOPY Left   . KNEE SURGERY Right    total knee  . MALONEY DILATION N/A 08/27/2013   Procedure: Venia Minks DILATION;  Surgeon: Rogene Houston, MD;  [facility];  Service: Endoscopy;  Laterality: N/A;  . PORTACATH PLACEMENT Right 2014  . PROSTATECTOMY    . removal of port Right   . SAVORY DILATION N/A 08/27/2013   Procedure: SAVORY DILATION;  Surgeon: Rogene Houston, MD;  [facility];  Service: Endoscopy;  Laterality: N/A;  . SHOULDER SURGERY    . TONSILLECTOMY    .  VASECTOMY       Medications: Current Meds  Medication Sig  . acetaminophen (TYLENOL) 325 MG tablet Take 650 mg by mouth every 4 (four) hours as needed for mild pain or moderate pain. Take 2 tablets 1 hour prior to Rituxan treatment.  [name] albuterol (PROAIR HFA) 108 (90 Base) MCG/ACT inhaler Inhale 2 puffs into the lungs at bedtime as needed for wheezing or shortness of breath.   [name] amLODipine (NORVASC) 5 MG tablet Take 1 tablet (5 mg total) by mouth daily.  . budesonide-formoterol (SYMBICORT) 80-4.5 MCG/ACT inhaler Inhale 2 puffs into the lungs 2 (two) times daily.  . Cyanocobalamin 1000 MCG/ML KIT Inject 1 mL as directed every 30 (thirty) days.  . diazepam (VALIUM) 5 MG tablet Take 5 mg by mouth at bedtime as needed (sleep).  . fluticasone (FLONASE) 50 MCG/ACT nasal spray Place 1 spray into both nostrils as needed for allergies or rhinitis.  [name] glipiZIDE (GLUCOTROL) 10 MG tablet Take 10 mg by mouth 2 (two) times daily before a meal.   . hydrocortisone 2.5 % cream Apply 1 application topically as needed (itching).  . insulin lispro (HUMALOG) 100 UNIT/ML cartridge Inject 10 Units into the skin as directed. On sliding scale when eating a big meal  . ondansetron (ZOFRAN) 4 MG tablet Take 1 tablet (4 mg total) by mouth every 8 (eight) hours as needed for nausea or vomiting.  . pantoprazole (PROTONIX) 40 MG tablet Take 1 tablet (40 mg total) by mouth 2 (two) times daily before a meal.  . propranolol (INDERAL) 20 MG tablet Take 1 tablet (20 mg total) by mouth 4 (four) times daily as needed.  . testosterone cypionate (DEPOTESTOSTERONE CYPIONATE) 200 MG/ML injection Inject 200 mg into the muscle every 14 (fourteen) days.   [name] tobramycin (TOBREX) 0.3 % ophthalmic solution Place 1 drop into both eyes daily.  Nelva Nay SOLOSTAR 300 UNIT/ML SOPN Inject 55 Units into the skin every morning.   . [DISCONTINUED] amLODipine (NORVASC) 5 MG tablet Take 1 tablet (5 mg total) by mouth daily.  . [DISCONTINUED] amLODipine  (NORVASC) 5 MG tablet Take 1 tablet (5 mg total) by mouth daily.     Allergies: Allergies  Allergen Reactions  . Bee Venom Anaphylaxis  . Adhesive [Tape] Hives  . Ciprofloxacin     Unknown   . Codeine     Unknown    . Iodine     Unknown    . Latex     Unknown    . Povidone-Iodine     Unknown    . Simvastatin     Unknown      Social History: The patient  reports that he has never smoked. He has never used smokeless  tobacco. He reports that he does not drink alcohol or use drugs.   Family History: The patient's family history includes Cancer in his brother and brother; Colon cancer in his brother; Diabetes in his father.   Review of Systems: Please see the history of present illness.   Otherwise, the review of systems is positive for none.   All other systems are reviewed and negative.   Physical Exam: VS:  BP 120/80 (BP Location: Left Arm, Patient Position: Sitting, Cuff Size: Large)   Pulse 61   Ht _0  (1.803 m)   Wt 237 lb 12.8 oz (107.9 kg)   SpO2 91% Comment: at rest  BMI 33.17 kg/m  .  BMI Body mass index is 33.17 kg/m.  Wt Readings from Last 3 Encounters:  03/17/17 237 lb 12.8 oz (107.9 kg)  03/04/17 235 lb (106.6 kg)  11/28/16 240 lb 6.4 oz (109 kg)    General: Pleasant. Obese and in no acute distress. His weight is up 3 pounds since I last saw him.   HEENT: Normal.  Neck: Supple, no JVD, carotid bruits, or masses noted.  Cardiac: Regular rate and rhythm. No murmurs, rubs, or gallops. No edema.  Respiratory:  Lungs are clear to auscultation bilaterally with normal work of breathing.  GI: Soft and nontender. Obese.  MS: No deformity or atrophy. Gait and ROM intact.  Skin: Warm and dry. Color is normal.  Neuro:  Strength and sensation are intact and no gross focal deficits noted.  Psych: Alert, appropriate and with normal affect.   LABORATORY DATA:  EKG:  EKG is not ordered today.   Lab Results  Component Value Date   WBC 7.0 03/04/2017    HGB 14.8 03/04/2017   HCT 44.5 03/04/2017   PLT 108 (L) 03/04/2017   GLUCOSE 278 (H) 03/04/2017   CHOL 195 05/14/2010   TRIG 115.0 05/14/2010   HDL 55.40 05/14/2010   LDLCALC 117 (H) 05/14/2010   ALT 21 03/04/2017   AST 24 03/04/2017   NA 136 03/04/2017   K 4.2 03/04/2017   CL 98 (L) 03/04/2017   CREATININE 1.54 (H) 03/04/2017   BUN 25 (H) 03/04/2017   CO2 28 03/04/2017   TSH 2.030 10/17/2015   PSA <0.01 11/28/2016   INR 1.22 12/31/2013     BNP (last 3 results) No results for input(s): BNP in the last 8760 hours.  ProBNP (last 3 results)  Recent Labs  07/10/16 1009  PROBNP 226.0*     Other Studies Reviewed Today:  CT CHEST IMPRESSION 08/2016: No lesion is seen corresponding to the area of concern over the left lower hemithorax seen on the scout tomogram of most recent study. There is currently no chest wall lesion. There is no new parenchymal lung lesion. The largest ground-glass nodular lesions seen on previous study is smaller currently. As this lesion does persist, recommendations from CT examination September 2017 remain in effect. No new nodular opacities. No adenopathy. There is atherosclerotic calcification at multiple sites including multiple foci of coronary artery calcification.   Electronically Signed   By: Lowella Grip III M.D.   On: 09/09/2016 09:25  Assessment/Plan:  1. CAD - managed with CV risk factor modification - no active symptoms noted. Unchanged DOE. I have left him on his current regimen. Encouraged him to keep up with his walking.   2. HTN - His blood pressure looks good. I refilled the Norvasc.   3. HLD - does not look like he is on statin.  Labs from PCP noted TC of 170, TG 94, HDL 34 and LDL of 117.   4. Prior Medication noncompliance - not really clear to me as to the etiology. This is a constant theme of his care.   5.Left flank pain/nausea - resolved.   6. Weight loss - resolved. Weight is back up.   Current  medicines are reviewed with the patient today.  The patient does not have concerns regarding medicines other than what has been noted above.  The following changes have been made:  See above.  Labs/ tests ordered today include:   No orders of the defined types were placed in this encounter.    Disposition:   FU with me in 6 months.   Patient is agreeable to this plan and will call if any problems develop in the interim.   SignedTruitt Merle, NP  03/17/2017 8:26 AM  [facility] [address] [place], [place]  [address] Phone: [phone] Fax: [phone]

## 2017-03-17 NOTE — Patient Instructions (Addendum)
We will be checking the following labs today - NONE   Medication Instructions:    Continue with your current medicines.   I have refilled your Norvasc today.     Testing/Procedures To Be Arranged:  N/A  Follow-Up:   See me in 6 months.     Other Special Instructions:   Keep up with the walking!!    If you need a refill on your cardiac medications before your next appointment, please call your pharmacy.   Call the [place] office at [phone] if you have any questions, problems or concerns.

## 2017-03-17 NOTE — Telephone Encounter (Signed)
S/w Erasmo Downer at [facility] to cancel amlodipine due to it being sent to another pharmacy.

## 2017-03-18 DIAGNOSIS — E291 Testicular hypofunction: Secondary | ICD-10-CM | POA: Diagnosis not present

## 2017-03-20 ENCOUNTER — Telehealth: Payer: Self-pay | Admitting: Nurse Practitioner

## 2017-03-20 ENCOUNTER — Other Ambulatory Visit: Payer: Self-pay | Admitting: Nurse Practitioner

## 2017-03-20 NOTE — Telephone Encounter (Signed)
°*  STAT* If patient is at the pharmacy, call can be transferred to refill team.   1. Which medications need to be refilled? (please list name of each medication and dose if known) amtodipine  2. Which pharmacy/location (including street and city if local pharmacy) is medication to be sent to? [place]   3. Do they need a 30 day or 90 day supply? [place]

## 2017-03-20 NOTE — Telephone Encounter (Signed)
Rx was sent to [facility] at patients office visit on 03/17/17. Per patient, the pharmacy did not receive this but he is also requesting to have the quantity changed to ninety. I will update rx and resend.

## 2017-04-01 DIAGNOSIS — E291 Testicular hypofunction: Secondary | ICD-10-CM | POA: Diagnosis not present

## 2017-04-03 ENCOUNTER — Encounter (HOSPITAL_COMMUNITY): Payer: Self-pay

## 2017-04-03 ENCOUNTER — Encounter (HOSPITAL_COMMUNITY): Payer: Medicare Other | Attending: Hematology and Oncology

## 2017-04-03 VITALS — BP 143/90 | HR 64 | Temp 97.5°F | Resp 18

## 2017-04-03 DIAGNOSIS — E538 Deficiency of other specified B group vitamins: Secondary | ICD-10-CM

## 2017-04-03 DIAGNOSIS — C8310 Mantle cell lymphoma, unspecified site: Secondary | ICD-10-CM | POA: Insufficient documentation

## 2017-04-03 DIAGNOSIS — C8319 Mantle cell lymphoma, extranodal and solid organ sites: Secondary | ICD-10-CM

## 2017-04-03 MED ORDER — CYANOCOBALAMIN 1000 MCG/ML IJ SOLN
1000.0000 ug | Freq: Once | INTRAMUSCULAR | Status: AC
  Administered 2017-04-03: 1000 ug via INTRAMUSCULAR
  Filled 2017-04-03: qty 1

## 2017-04-03 NOTE — Patient Instructions (Signed)
[facility] at [facility] Discharge Instructions  RECOMMENDATIONS MADE BY THE CONSULTANT AND ANY TEST RESULTS WILL BE SENT TO YOUR REFERRING PHYSICIAN.  Received Vit B12 injection today. Follow-up as scheduled. Call clinic for any questions or concerns  Thank you for choosing [facility] at [facility] to provide your oncology and hematology care.  To afford each patient quality time with our provider, please arrive at least 15 minutes before your scheduled appointment time.    If you have a lab appointment with the Cancer Center please come in thru the  Main Entrance and check in at the main information desk  You need to re-schedule your appointment should you arrive 10 or more minutes late.  We strive to give you quality time with our providers, and arriving late affects you and other patients whose appointments are after yours.  Also, if you no show three or more times for appointments you may be dismissed from the clinic at the providers discretion.     Again, thank you for choosing [facility].  Our hope is that these requests will decrease the amount of time that you wait before being seen by our physicians.       _____________________________________________________________  Should you have questions after your visit to  Cancer Center, please contact our office at [phone] between the hours of 8:30 a.m. and 4:30 p.m.  Voicemails left after 4:30 p.m. will not be returned until the following business day.  For prescription refill requests, have your pharmacy contact our office.       Resources For Cancer Patients and their Caregivers ? American Cancer Society: Can assist with transportation, wigs, general needs, runs Look Good Feel Better.        1-888-227-6333 ? Cancer Care: Provides financial assistance, online support groups, medication/co-pay assistance.  1-800-813-HOPE (4673) ? [facility] Assists [place] cancer patients and their families through emotional , educational and financial support.  336-427-4357 ? [place] DSS Where to apply for food stamps, Medicaid and utility assistance. 336-342-1394 ? RCATS: Transportation to medical appointments. 336-347-2287 ? Social Security Administration: May apply for disability if have a Stage IV cancer. 336-342-7796 1-800-772-1213 ? [place] Aging, Disability and Transit Services: Assists with nutrition, care and transit needs. 336-349-2343  Cancer Center Support Programs: @10RELATIVEDAYS@ > Cancer Support Group  2nd Tuesday of the month 1pm-2pm, Journey Room  > Creative Journey  3rd Tuesday of the month 1130am-1pm, Journey Room  > Look Good Feel Better  1st Wednesday of the month 10am-12 noon, Journey Room (Call American Cancer [employer] to register 1-800-395-5775)   

## 2017-04-03 NOTE — Progress Notes (Signed)
[name] tolerated Vit B12 injection well without complaints or incident. VSS Pt discharged self ambulatory in satisfactory condition.

## 2017-04-15 DIAGNOSIS — E291 Testicular hypofunction: Secondary | ICD-10-CM | POA: Diagnosis not present

## 2017-04-16 DIAGNOSIS — Z6836 Body mass index (BMI) 36.0-36.9, adult: Secondary | ICD-10-CM | POA: Diagnosis not present

## 2017-04-16 DIAGNOSIS — L500 Allergic urticaria: Secondary | ICD-10-CM | POA: Diagnosis not present

## 2017-04-28 DIAGNOSIS — E291 Testicular hypofunction: Secondary | ICD-10-CM | POA: Diagnosis not present

## 2017-05-01 DIAGNOSIS — G629 Polyneuropathy, unspecified: Secondary | ICD-10-CM | POA: Diagnosis not present

## 2017-05-01 DIAGNOSIS — E1122 Type 2 diabetes mellitus with diabetic chronic kidney disease: Secondary | ICD-10-CM | POA: Diagnosis not present

## 2017-05-01 DIAGNOSIS — H8149 Vertigo of central origin, unspecified ear: Secondary | ICD-10-CM | POA: Diagnosis not present

## 2017-05-05 ENCOUNTER — Encounter (HOSPITAL_COMMUNITY): Payer: Medicare Other | Attending: Hematology and Oncology

## 2017-05-05 VITALS — BP 141/75 | HR 64 | Temp 97.5°F | Resp 18

## 2017-05-05 DIAGNOSIS — C8310 Mantle cell lymphoma, unspecified site: Secondary | ICD-10-CM | POA: Insufficient documentation

## 2017-05-05 DIAGNOSIS — Z95828 Presence of other vascular implants and grafts: Secondary | ICD-10-CM

## 2017-05-05 DIAGNOSIS — E538 Deficiency of other specified B group vitamins: Secondary | ICD-10-CM | POA: Diagnosis present

## 2017-05-05 DIAGNOSIS — C8319 Mantle cell lymphoma, extranodal and solid organ sites: Secondary | ICD-10-CM

## 2017-05-05 MED ORDER — SODIUM CHLORIDE 0.9% FLUSH
10.0000 mL | INTRAVENOUS | Status: DC | PRN
  Administered 2017-05-05: 10 mL via INTRAVENOUS
  Filled 2017-05-05: qty 10

## 2017-05-05 MED ORDER — HEPARIN SOD (PORK) LOCK FLUSH 100 UNIT/ML IV SOLN
500.0000 [IU] | Freq: Once | INTRAVENOUS | Status: AC
  Administered 2017-05-05: 500 [IU] via INTRAVENOUS
  Filled 2017-05-05: qty 5

## 2017-05-05 MED ORDER — LIDOCAINE-PRILOCAINE 2.5-2.5 % EX CREA: TOPICAL_CREAM | CUTANEOUS | 2 refills | Status: DC

## 2017-05-05 MED ORDER — CYANOCOBALAMIN 1000 MCG/ML IJ SOLN
1000.0000 ug | Freq: Once | INTRAMUSCULAR | Status: AC
  Administered 2017-05-05: 1000 ug via INTRAMUSCULAR
  Filled 2017-05-05: qty 1

## 2017-05-05 NOTE — Patient Instructions (Signed)
[place] at [facility] Discharge Instructions  RECOMMENDATIONS MADE BY THE CONSULTANT AND ANY TEST RESULTS WILL BE SENT TO YOUR REFERRING PHYSICIAN.  You had your port flushed today, you will need to continue every 6-8 weeks with flushes You had your B-12 injection today, continue monthly Follow up as scheduled.  Thank you for choosing [place] at [facility] to provide your oncology and hematology care.  To afford each patient quality time with our provider, please arrive at least 15 minutes before your scheduled appointment time.    If you have a lab appointment with the [facility] please come in thru the  Main Entrance and check in at the main information desk  You need to re-schedule your appointment should you arrive 10 or more minutes late.  We strive to give you quality time with our providers, and arriving late affects you and other patients whose appointments are after yours.  Also, if you no show three or more times for appointments you may be dismissed from the clinic at the providers discretion.     Again, thank you for choosing [facility].  Our hope is that these requests will decrease the amount of time that you wait before being seen by our physicians.       _____________________________________________________________  Should you have questions after your visit to [facility], please contact our office at [phone] between the hours of 8:30 a.m. and 4:30 p.m.  Voicemails left after 4:30 p.m. will not be returned until the following business day.  For prescription refill requests, have your pharmacy contact our office.       Resources For Cancer Patients and their Caregivers ? American Cancer Society: Can assist with transportation, wigs, general needs, runs Look Good Feel Better.        534-336-4155 ? Cancer Care: Provides financial assistance, online support groups, medication/co-pay  assistance.  1-800-813-HOPE 8085476223) ? [facility] Assists [place] cancer patients and their families through emotional , educational and financial support.  3615693069 ? [place] DSS Where to apply for food stamps, Medicaid and utility assistance. 669-785-4389 ? RCATS: Transportation to medical appointments. 8021490889 ? Social Security Administration: May apply for disability if have a Stage IV cancer. 347-051-7010 330-180-2714 ? [employer], Disability and Transit Services: Assists with nutrition, care and transit needs. [place] Support Programs: @10RELATIVEDAYS @ > Cancer Support Group  2nd Tuesday of the month 1pm-2pm, Journey Room  > Creative Journey  3rd Tuesday of the month 1130am-1pm, Journey Room  > Look Good Feel Better  1st Wednesday of the month 10am-12 noon, Journey Room (Call [place] to register [phone])

## 2017-05-05 NOTE — Progress Notes (Signed)
[name] presented for Portacath access and flush. Portacath located right chest wall accessed with  H 20 needle. Good blood return present. Portacath flushed with 22ml NS and 500U/24ml Heparin and needle removed intact. Procedure without incident. Patient tolerated procedure well.  [name] presents today for injection per MD orders. B12 1000 mcg administered SQ in left deltoid. Administration without incident. Patient tolerated well.  Patient discharged ambulatory and in stable condition from clinic. Patient provided with a copy of upcoming appointments. He will follow up as scheduled.

## 2017-05-12 DIAGNOSIS — E291 Testicular hypofunction: Secondary | ICD-10-CM | POA: Diagnosis not present

## 2017-05-13 ENCOUNTER — Encounter (HOSPITAL_COMMUNITY): Payer: Self-pay

## 2017-05-13 ENCOUNTER — Emergency Department (HOSPITAL_COMMUNITY)
Admission: EM | Admit: 2017-05-13 | Discharge: 2017-05-13 | Disposition: A | Discharge status: Home / Self Care | Payer: Medicare Other | Attending: Emergency Medicine | Admitting: Emergency Medicine

## 2017-05-13 ENCOUNTER — Other Ambulatory Visit: Payer: Self-pay

## 2017-05-13 ENCOUNTER — Emergency Department (HOSPITAL_COMMUNITY): Payer: Medicare Other

## 2017-05-13 DIAGNOSIS — E162 Hypoglycemia, unspecified: Secondary | ICD-10-CM | POA: Diagnosis not present

## 2017-05-13 DIAGNOSIS — Z79899 Other long term (current) drug therapy: Secondary | ICD-10-CM | POA: Diagnosis not present

## 2017-05-13 DIAGNOSIS — F4489 Other dissociative and conversion disorders: Secondary | ICD-10-CM | POA: Diagnosis not present

## 2017-05-13 DIAGNOSIS — Z794 Long term (current) use of insulin: Secondary | ICD-10-CM | POA: Diagnosis not present

## 2017-05-13 DIAGNOSIS — I2510 Atherosclerotic heart disease of native coronary artery without angina pectoris: Secondary | ICD-10-CM | POA: Diagnosis not present

## 2017-05-13 DIAGNOSIS — Z8546 Personal history of malignant neoplasm of prostate: Secondary | ICD-10-CM | POA: Insufficient documentation

## 2017-05-13 DIAGNOSIS — N183 Chronic kidney disease, stage 3 (moderate): Secondary | ICD-10-CM | POA: Insufficient documentation

## 2017-05-13 DIAGNOSIS — R4182 Altered mental status, unspecified: Secondary | ICD-10-CM | POA: Diagnosis not present

## 2017-05-13 DIAGNOSIS — E1122 Type 2 diabetes mellitus with diabetic chronic kidney disease: Secondary | ICD-10-CM | POA: Diagnosis not present

## 2017-05-13 DIAGNOSIS — Z85038 Personal history of other malignant neoplasm of large intestine: Secondary | ICD-10-CM | POA: Diagnosis not present

## 2017-05-13 DIAGNOSIS — E11649 Type 2 diabetes mellitus with hypoglycemia without coma: Secondary | ICD-10-CM | POA: Diagnosis not present

## 2017-05-13 DIAGNOSIS — R531 Weakness: Secondary | ICD-10-CM | POA: Diagnosis not present

## 2017-05-13 DIAGNOSIS — I129 Hypertensive chronic kidney disease with stage 1 through stage 4 chronic kidney disease, or unspecified chronic kidney disease: Secondary | ICD-10-CM | POA: Insufficient documentation

## 2017-05-13 DIAGNOSIS — Z91040 Latex allergy status: Secondary | ICD-10-CM | POA: Insufficient documentation

## 2017-05-13 LAB — URINALYSIS, ROUTINE W REFLEX MICROSCOPIC
Bacteria, UA: NONE SEEN
Bilirubin Urine: NEGATIVE
HGB URINE DIPSTICK: NEGATIVE
Ketones, ur: NEGATIVE mg/dL
Leukocytes, UA: NEGATIVE
NITRITE: NEGATIVE
PROTEIN: NEGATIVE mg/dL
SPECIFIC GRAVITY, URINE: 1.013 (ref 1.005–1.030)
pH: 5 (ref 5.0–8.0)

## 2017-05-13 LAB — CBC WITH DIFFERENTIAL/PLATELET
BASOS ABS: 0.1 10*3/uL (ref 0.0–0.1)
BASOS PCT: 0 %
EOS ABS: 0.1 10*3/uL (ref 0.0–0.7)
EOS PCT: 1 %
HCT: 48.8 % (ref 39.0–52.0)
Hemoglobin: 16 g/dL (ref 13.0–17.0)
Lymphocytes Relative: 7 %
Lymphs Abs: 1 10*3/uL (ref 0.7–4.0)
MCH: 29.8 pg (ref 26.0–34.0)
MCHC: 32.8 g/dL (ref 30.0–36.0)
MCV: 90.9 fL (ref 78.0–100.0)
MONO ABS: 0.4 10*3/uL (ref 0.1–1.0)
Monocytes Relative: 3 %
Neutro Abs: 12.6 10*3/uL — ABNORMAL HIGH (ref 1.7–7.7)
Neutrophils Relative %: 89 %
PLATELETS: 118 10*3/uL — AB (ref 150–400)
RBC: 5.37 MIL/uL (ref 4.22–5.81)
RDW: 17.8 % — AB (ref 11.5–15.5)
WBC: 14.2 10*3/uL — AB (ref 4.0–10.5)

## 2017-05-13 LAB — COMPREHENSIVE METABOLIC PANEL
ALK PHOS: 55 U/L (ref 38–126)
ALT: 22 U/L (ref 17–63)
AST: 28 U/L (ref 15–41)
Albumin: 4.3 g/dL (ref 3.5–5.0)
Anion gap: 11 (ref 5–15)
BUN: 25 mg/dL — AB (ref 6–20)
CALCIUM: 9 mg/dL (ref 8.9–10.3)
CO2: 29 mmol/L (ref 22–32)
Chloride: 97 mmol/L — ABNORMAL LOW (ref 101–111)
Creatinine, Ser: 1.95 mg/dL — ABNORMAL HIGH (ref 0.61–1.24)
GFR calc Af Amer: 36 mL/min — ABNORMAL LOW (ref >60)
GFR, EST NON AFRICAN AMERICAN: 31 mL/min — AB (ref >60)
Glucose, Bld: 297 mg/dL — ABNORMAL HIGH (ref 65–99)
POTASSIUM: 3.9 mmol/L (ref 3.5–5.1)
Sodium: 137 mmol/L (ref 135–145)
Total Bilirubin: 1 mg/dL (ref 0.3–1.2)
Total Protein: 7 g/dL (ref 6.5–8.1)

## 2017-05-13 LAB — I-STAT CG4 LACTIC ACID, ED: LACTIC ACID, VENOUS: 3.01 mmol/L — AB (ref 0.5–1.9)

## 2017-05-13 LAB — CBG MONITORING, ED: GLUCOSE-CAPILLARY: 421 mg/dL — AB (ref 65–99)

## 2017-05-13 MED ORDER — SODIUM CHLORIDE 0.9 % IV BOLUS (SEPSIS)
1000.0000 mL | Freq: Once | INTRAVENOUS | Status: AC
  Administered 2017-05-13: 1000 mL via INTRAVENOUS

## 2017-05-13 MED ORDER — ALUM & MAG HYDROXIDE-SIMETH 200-200-20 MG/5ML PO SUSP
30.0000 mL | Freq: Once | ORAL | Status: AC
  Administered 2017-05-13: 30 mL via ORAL
  Filled 2017-05-13: qty 30

## 2017-05-13 MED ORDER — DEXTROSE-NACL 5-0.45 % IV SOLN
INTRAVENOUS | Status: DC
  Administered 2017-05-13: 20:00:00 via INTRAVENOUS

## 2017-05-13 NOTE — ED Notes (Signed)
Pt is still too cold to take an accurate temp. Will continue to warm pt up and will check temperature again in 15 mins.

## 2017-05-13 NOTE — ED Notes (Signed)
Pt alert & oriented x4, stable gait. Patient  given discharge instructions, paperwork & prescription(s). Patient verbalized understanding. Pt left department w/ no further questions. 

## 2017-05-13 NOTE — ED Notes (Signed)
Pt ambulated to bathroom without difficulty.

## 2017-05-13 NOTE — ED Provider Notes (Signed)
[facility] DEPT Provider Note   CSN: [id number] Arrival date & time:        History   Chief Complaint Chief Complaint  Patient presents with  . Hypoglycemia    HPI QUENTEN NAWAZ is a 82 y.o. male.  HPI  Patient presents via EMS after an episode of hypoglycemia. Patient is currently awake and alert, corroborates the history. However, EMS notes that patient was listless on arrival, had initial glucose of 44. Patient received dextrose, and had increase in glucose in transport, as well as increase in cognition prior to arrival. Patient states that he takes medication regularly, including insulin today. He does not recall his dietary intake following his morning insulin dose. He does recall being outside, mowing his grass, then there is a period to which he is amnestic.  Past Medical History:  Diagnosis Date  . B12 deficiency 02/07/2014  . Colon cancer ([place])   . 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 Essential hypertension, benign   . GERD (gastroesophageal reflux disease)   . Mixed hyperlipidemia   . Obstructive sleep apnea    does not use  . Prostate cancer ([place])   . PVD (peripheral vascular disease) ([place])   . Type 2 diabetes mellitus [facility])     Patient Active Problem List   Diagnosis Date Noted  . Multiple pulmonary nodules 08/01/2016  . Upper airway cough syndrome 07/30/2016  . Chronic asthma vs UACS  07/11/2016  . Mild cognitive impairment 01/22/2016  . Essential tremor 10/17/2015  . CKD (chronic kidney disease) stage 3, GFR 30-59 ml/min 07/02/2015  . AKI (acute kidney injury) ([place]) 06/30/2015  . Acute kidney injury ([place]) 06/30/2015  . B12 deficiency 02/07/2014  . Diverticulosis of colon with hemorrhage 01/01/2014  . Thrombocytopenia, unspecified ([place]) 01/01/2014  . Acute blood loss anemia 01/01/2014  . Leukopenia 12/31/2013  . Lower GI bleed 12/30/2013  .  Diverticulitis 12/30/2013  . Abdominal pain, left lower quadrant 11/16/2013  . Abdominal pain, unspecified site 11/16/2013  . Mantle cell lymphoma ([place]) 09/07/2013  . Prostate cancer ([place]) 09/07/2013  . Proctitis, 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  . H/O ulcerative colitis 04/12/2013  . OSA (obstructive sleep apnea) 08/15/2012  . VASOMOTOR RHINITIS 01/23/2010  . GERD 01/23/2010  . SOB (shortness of breath) 01/01/2010  . Mixed hyperlipidemia 11/23/2009  . HYPERTENSION, BENIGN 11/23/2009  . Coronary atherosclerosis of native coronary artery 02/18/2009    Past Surgical History:  Procedure Laterality Date  . BACK SURGERY    . BALLOON DILATION N/A 08/27/2013   Procedure: BALLOON DILATION;  Surgeon: Rogene Houston, MD;  [facility];  Service: Endoscopy;  Laterality: N/A;  . COLON SURGERY    . COLONOSCOPY N/A 12/17/2013   Procedure: COLONOSCOPY;  Surgeon: Rogene Houston, MD;  Location: [facility];  Service: Endoscopy;  Laterality: N/A;  940  . COLONOSCOPY N/A 02/28/2014   Procedure: COLONOSCOPY;  Surgeon: Rogene Houston, MD;  Location[facility];  Service: Endoscopy;  Laterality: N/A;  730  . COLONOSCOPY N/A 08/04/2015   Procedure: COLONOSCOPY;  Surgeon: Rogene Houston, MD;  [facility];  Service: Endoscopy;  Laterality: N/A;  200 - moved to 11/11 @ 2:10 - Ann to notify pt  . COLONOSCOPY WITH ESOPHAGOGASTRODUODENOSCOPY (EGD) N/A 08/27/2013   Procedure: COLONOSCOPY WITH ESOPHAGOGASTRODUODENOSCOPY (EGD);  Surgeon: Rogene Houston, MD;  [facility];  Service: Endoscopy;  Laterality:  N/A;  730  . KNEE ARTHROSCOPY Left   . KNEE SURGERY Right    total knee  . MALONEY DILATION N/A 08/27/2013   Procedure: Venia Minks DILATION;  Surgeon: Rogene Houston, MD;  [facility];  Service: Endoscopy;  Laterality: N/A;  . PORTACATH PLACEMENT Right 2014  . PROSTATECTOMY    . removal of port Right   . SAVORY DILATION N/A 08/27/2013   Procedure: SAVORY DILATION;   Surgeon: Rogene Houston, MD;  [facility];  Service: Endoscopy;  Laterality: N/A;  . SHOULDER SURGERY    . TONSILLECTOMY    . VASECTOMY         Home Medications    Prior to Admission medications   Medication Sig Start Date End Date Taking? Authorizing Provider  acetaminophen (TYLENOL) 325 MG tablet Take 650 mg by mouth every 4 (four) hours as needed for mild pain or moderate pain. Take 2 tablets 1 hour prior to Rituxan treatment.    [provider]  albuterol (PROAIR HFA) 108 (90 Base) MCG/ACT inhaler Inhale 2 puffs into the lungs at bedtime as needed for wheezing or shortness of breath.     [provider]  amLODipine (NORVASC) 5 MG tablet TAKE ONE TABLET BY MOUTH ONCE DAILY. 03/20/17   Burtis Junes, NP  budesonide-formoterol (SYMBICORT) 80-4.5 MCG/ACT inhaler Inhale 2 puffs into the lungs 2 (two) times daily. 07/30/16   Tanda Rockers, MD  Cyanocobalamin 1000 MCG/ML KIT Inject 1 mL as directed every 30 (thirty) days.    [provider]  diazepam (VALIUM) 5 MG tablet Take 5 mg by mouth at bedtime as needed (sleep).    [provider]  fluticasone (FLONASE) 50 MCG/ACT nasal spray Place 1 spray into both nostrils as needed for allergies or rhinitis.    [provider]  glipiZIDE (GLUCOTROL) 10 MG tablet Take 10 mg by mouth 2 (two) times daily before a meal.  01/09/16   [provider]  hydrocortisone 2.5 % cream Apply 1 application topically as needed (itching).    [provider]  insulin lispro (HUMALOG) 100 UNIT/ML cartridge Inject 10 Units into the skin as directed. On sliding scale when eating a big meal    [provider]  lidocaine-prilocaine (EMLA) cream Apply quarter size amount over port site one hour prior to appointment and cover with plastic wrap. 05/05/17   Twana First, MD  ondansetron (ZOFRAN) 4 MG tablet Take 1 tablet (4 mg total) by mouth every 8 (eight) hours as needed for nausea or  vomiting. 09/19/16   Rehman, Mechele Dawley, MD  pantoprazole (PROTONIX) 40 MG tablet Take 1 tablet (40 mg total) by mouth 2 (two) times daily before a meal. 08/04/15   Rehman, Mechele Dawley, MD  propranolol (INDERAL) 20 MG tablet Take 1 tablet (20 mg total) by mouth 4 (four) times daily as needed. 11/04/16   Melvenia Beam, MD  testosterone cypionate (DEPOTESTOSTERONE CYPIONATE) 200 MG/ML injection Inject 200 mg into the muscle every 14 (fourteen) days.  04/29/16   [provider]  tobramycin (TOBREX) 0.3 % ophthalmic solution Place 1 drop into both eyes daily.    [provider]  TOUJEO SOLOSTAR 300 UNIT/ML SOPN Inject 55 Units into the skin every morning.  03/23/16   [provider]    Family History Family History  Problem Relation Age of Onset  . Colon cancer Brother   . Cancer Brother   . Diabetes Father   . Cancer  Brother     Social History Social History  Substance Use Topics  . Smoking status: Never Smoker  . Smokeless tobacco: Never Used  . Alcohol use No     Allergies   Bee venom; Adhesive [tape]; Ciprofloxacin; Codeine; Iodine; Latex; Povidone-iodine; and Simvastatin   Review of Systems Review of Systems  Constitutional:       Per HPI, otherwise negative  HENT:       Per HPI, otherwise negative  Respiratory:       Per HPI, otherwise negative  Cardiovascular:       Per HPI, otherwise negative  Gastrointestinal: Positive for nausea. Negative for vomiting.  Endocrine:       Negative aside from HPI  Genitourinary:       Neg aside from HPI   Musculoskeletal:       Per HPI, otherwise negative  Skin: Negative.   Neurological: Positive for tremors and weakness. Negative for syncope.     Physical Exam Updated Vital Signs Pulse 72   Resp (!) 23   Ht 5' 11"  (1.803 m)   Wt 104.3 kg (230 lb)   SpO2 92%   BMI 32.08 kg/m   Physical Exam  Constitutional: He is oriented to person, place, and time. He appears well-developed. No distress.  HENT:    Head: Normocephalic and atraumatic.  Eyes: Conjunctivae and EOM are normal.  Cardiovascular: Normal rate and regular rhythm.   Pulmonary/Chest: Effort normal. No stridor. No respiratory distress.  Abdominal: He exhibits no distension.  Musculoskeletal: He exhibits no edema.  Neurological: He is alert and oriented to person, place, and time. He displays tremor.  Skin: Skin is warm and dry.  Psychiatric: He has a normal mood and affect.  Nursing note and vitals reviewed.    ED Treatments / Results  Labs (all labs ordered are listed, but only abnormal results are displayed) Labs Reviewed  COMPREHENSIVE METABOLIC PANEL  CBC WITH DIFFERENTIAL/PLATELET  URINALYSIS, ROUTINE W REFLEX MICROSCOPIC  I-STAT CG4 LACTIC ACID, ED     Radiology No results found.  Procedures Procedures (including critical care time)  Medications Ordered in ED Medications  dextrose 5 %-0.45 % sodium chloride infusion (not administered)     Initial Impression / Assessment and Plan / ED Course  I have reviewed the triage vital signs and the nursing notes.  Pertinent labs & imaging results that were available during my care of the patient were reviewed by me and considered in my medical decision making (see chart for details).  8:54 PM On repeat exam the patient is awake and alert. He is eating, drinking. He denies ongoing complaints. Patient's wife now notes that the patient ate breakfast, and then took his insulin, later than usual. Subsequent, he did not eat anything for the remaining part of the day prior to mowing his lawn. She notes the patient became stuporous while on a lawnmower, and fell off. She gave the patient juice, and he began improving prior to EMS arrival. Labs notable for worsening renal function, though no substantial loculated abnormalities. Patient has not received fluids, dextrose, has had stable glucose value for several checks, and is indeed, hyperglycemic.  Patient remains  in similar condition, and with reassuring return to euglycemia, hyperglycemia, he was discharged with explicit instructions to follow-up with his physician to discuss his insulin regimen. Return precautions also discussed with the patient and his wife.    Final Clinical Impressions(s) / ED Diagnoses  Hyperglycemia   Carmin Muskrat, MD 05/13/17 2055

## 2017-05-13 NOTE — Discharge Instructions (Signed)
As discussed, it is important that you schedule a follow-up visit with your physician to discuss your anti-hyperglycemic regimen.  Return here for concerning changes in your condition.

## 2017-05-13 NOTE — ED Notes (Signed)
Date and time results received: 05/13/17  1930 (use smartphrase ".now" to insert current time)  Test: I stat lactic  Critical Value: 3.01  Name of Provider Notified: Vanita Panda  Orders Received? Or Actions Taken?:none

## 2017-05-13 NOTE — ED Triage Notes (Signed)
Pt was out cutting grass when a neighbor saw him.Pt.'s sugar had dropped to 44..Given 1 amp of D50. CBG came up to 188, but pt was lethargic during transport. Tachycardic 110 and had hypertension in route. Pt denies any pain. Shaking and nauseated. Was given Zofran 4mg  in route.   Pt alert and oriented x4.  1013mL Bolus hanging now.

## 2017-05-19 ENCOUNTER — Ambulatory Visit (INDEPENDENT_AMBULATORY_CARE_PROVIDER_SITE_OTHER): Payer: Medicare Other | Admitting: Neurology

## 2017-05-19 ENCOUNTER — Encounter: Payer: Self-pay | Admitting: Neurology

## 2017-05-19 VITALS — BP 119/76 | HR 68 | Ht 71.0 in | Wt 234.4 lb

## 2017-05-19 DIAGNOSIS — I259 Chronic ischemic heart disease, unspecified: Secondary | ICD-10-CM | POA: Diagnosis not present

## 2017-05-19 DIAGNOSIS — R251 Tremor, unspecified: Secondary | ICD-10-CM | POA: Diagnosis not present

## 2017-05-19 DIAGNOSIS — R4189 Other symptoms and signs involving cognitive functions and awareness: Secondary | ICD-10-CM | POA: Diagnosis not present

## 2017-05-19 NOTE — Progress Notes (Signed)
Checked patient's glucose in office. Reading: 292. Patient reported he had not yet taken any insulin yet. Will recheck glucose at home and take once home from office visit.

## 2017-05-19 NOTE — Patient Instructions (Signed)
Remember to drink plenty of fluid, eat healthy meals and do not skip any meals. Try to eat protein with a every meal and eat a healthy snack such as fruit or nuts in between meals. Try to keep a regular sleep-wake schedule and try to exercise daily, particularly in the form of walking, 20-30 minutes a day, if you can.   As far as your medications are concerned, I would like to suggest: Continue propranolol, daily baby aspirin  I would like to see you back in 6 months, sooner if we need to. Please call us with any interim questions, concerns, problems, updates or refill requests.   Our phone number is [phone]. We also have an after hours call service for urgent matters and there is a physician on-call for urgent questions. For any emergencies you know to call 911 or go to the nearest emergency room

## 2017-05-19 NOTE — Progress Notes (Signed)
[name]  Provider:  Dr Jaynee Eagles Referring Provider: Celene Squibb, MD Primary Care Physician:  Wende Neighbors, MD  CC: tremor  Interval history 05/19/2017: Propranolol is still helping. He feels his memory is stable.  He takes his own medications and doesn;t miss any, he pays his bills every month and not missing pills or double paying, no difficulties driving, no accidents in the home. Not getting lost. He knows the date every day, he reads the newspaper every day, he walks, he socializes with his family sees family every day. He is still taking B12 supplement. He worries about things, he worries about mowing the yard because he runs out of time. He works on Wednesday and [place]. Neuropathy is stable in his feet, has burning which is chronic and stable. Recommend daily ASA for stroke prevention. He hydrates well. He sometimes feels lightheaded for the last 2-3 months, takes propranolol 3x a day recommend maybe decreasing to twice a day and if symptoms of lightheadedness/dizzy get worse call us. He has had hypoglycemia and was seen in the ED and this could be the problem as well.   Interval history for tremor 11/04/2016: Patient is here for follow up on tremor on propranolol. He is fine taking the propranolol 3x a day. He can take it up to 4x a day as needed. Memory is stable. Didn't tolerate the Aricept. Discussed the tremor and decided to increase as tolerated and needed. Discussed other medications other than aricept but he declines we will just   Interval history 05/06/2016:Honor C Schack is a 82 y.o. male here as a referral from Dr. [place] Crane for tremor. PMHx CKD, Type 2 DM, HLD, HTN, insomnia, OSA, depression, polyneuropathy, cerebral ischemia, b12 deficiency, mantle cell lymphoma, anxiety, parkinson's diseas (?). He goes by the name [name]. Had side effects with primidone and neurontin. He has been taking 21m propranolol three times a day. It has helped with  the tremor and blood pressure has not been low and no problems taking the medication 3x a day.   MRI of the brain was unremarkable for age: IMPRESSION: Generalized cortical atrophy and chronic microvascular ischemicchanges, typical for age. No acute abnormality.   Interval history 01/22/2016: primidone and neurontin had side effects. Discussed propranolol and that this is heart medication, we need to be very mindful of his pulse and blood pressure and this can cause significant decreases in both, he should take his blood pressure and pulse daily, do not take if BP less than 11/60 or pulse < 60, stop for anything concerning whatsoever including SOB, CP. Discussed his memory, He forgetting conversation, things wife tells you, people's names, no dementia in the family. Been going on slowly progressive for at least 2 years. He takes b12 shots. He used to have a cpap and stopped. He is tired during the day. He snores, his wife doesn't. Last sleep study was 4-5 years ago. He is Morbidly obese. Epworth sleep scale only 5/10, doesn't appear to be candidate for sleep stufy. Discussed neurocognitive testing as well.  HPI: Brandon POHLEis a 82y.o. male here as a referral from Dr. [place] Cran[place] tremor. PMHx CKD, Type 2 DM, HLD, HTN, insomnia, OSA, depression, polyneuropathy, cerebral ischemia, b12 deficiency, mantle cell lymphoma, anxiety, parkinson's diseas (?). He goes by the name [name] Tremor has been going on for over a year. Worsening. Some days he doesn't have it. Today is a good day. When he has it, it is continuous, both  resting and postural. He is weak all over, he has pain all over, he went to physical therapy and improved. He has been taking carbidopa/levodopa and it has not helped the tremor, he got it from a doctor in [place]. Tremor started more than year. Getting worse. He has a difficult time writing mostly. No difficulty eating, denies excessive coffee. Nothing makes it better or unknown.  Grandmother had a tremor, she had it severely and also her head shook and she shook all over. Sister has a similar tremor. He has trouble turning pages due to the tremor. Slowly progressive. No other focal neurologic deficits.   Review of outside records; HgbA1c 6.5, cmp 08/24/2015 creatinine 1.28 bun 18 gfr 54 Cbc with 3.2 wbc and anemia 11.4/36.7  Review of Systems: Patient complains of symptoms per HPI as well as the following symptoms: weight gain, easy bruising, easy beeding, SOB, cough, swelling in legs, ringing in ears, cramps, aching musckes, allergies, runnynose, weaknes, decrease energy, change in appetite, restless legs. Pertinent negatives per HPI. All others negative.   Social History   Social History  . Marital status: Married    Spouse name: [name]  . Number of children: 2  . Years of education: 8   Occupational History  . Retired  Retired   Social History Main Topics  . Smoking status: Never Smoker  . Smokeless tobacco: Never Used  . Alcohol use No  . Drug use: No  . Sexual activity: Yes    Birth control/ protection: None   Other Topics Concern  . Not on file   Social History Narrative   Lives with wife   Caffeine use: none   Right-handed    Family History  Problem Relation Age of Onset  . Colon cancer Brother   . Cancer Brother   . Diabetes Father   . Cancer Brother     Past Medical History:  Diagnosis Date  . B12 deficiency 02/07/2014  . Colon cancer ([place])   . 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 Essential hypertension, benign   . GERD (gastroesophageal reflux disease)   . Mixed hyperlipidemia   . Obstructive sleep apnea    does not use  . Prostate cancer ([place])   . PVD (peripheral vascular disease) ([place])   . Type 2 diabetes mellitus ([place])     Past Surgical History:  Procedure Laterality Date  . BACK SURGERY    . BALLOON DILATION N/A  08/27/2013   Procedure: BALLOON DILATION;  Surgeon: Rogene Houston, MD;  [facility];  Service: Endoscopy;  Laterality: N/A;  . COLON SURGERY    . COLONOSCOPY N/A 12/17/2013   Procedure: COLONOSCOPY;  Surgeon: Rogene Houston, MD;  [facility];  Service: Endoscopy;  Laterality: N/A;  940  . COLONOSCOPY N/A 02/28/2014   Procedure: COLONOSCOPY;  Surgeon: Rogene Houston, MD;  [facility];  Service: Endoscopy;  Laterality: N/A;  730  . COLONOSCOPY N/A 08/04/2015   Procedure: COLONOSCOPY;  Surgeon: Rogene Houston, MD;  [facility];  Service: Endoscopy;  Laterality: N/A;  200 - moved to 11/11 @ 2:10 - Ann to notify pt  . COLONOSCOPY WITH ESOPHAGOGASTRODUODENOSCOPY (EGD) N/A 08/27/2013   Procedure: COLONOSCOPY WITH ESOPHAGOGASTRODUODENOSCOPY (EGD);  Surgeon: Rogene Houston, MD;  [facility];  Service: Endoscopy;  Laterality: N/A;  730  . KNEE ARTHROSCOPY Left   .  KNEE SURGERY Right    total knee  . MALONEY DILATION N/A 08/27/2013   Procedure: Venia Minks DILATION;  Surgeon: Rogene Houston, MD;  [facility];  Service: Endoscopy;  Laterality: N/A;  . PORTACATH PLACEMENT Right 2014  . PROSTATECTOMY    . removal of port Right   . SAVORY DILATION N/A 08/27/2013   Procedure: SAVORY DILATION;  Surgeon: Rogene Houston, MD;  [facility];  Service: Endoscopy;  Laterality: N/A;  . SHOULDER SURGERY    . TONSILLECTOMY    . VASECTOMY      Current Outpatient Prescriptions  Medication Sig Dispense Refill  . acetaminophen (TYLENOL) 325 MG tablet Take 650 mg by mouth every 4 (four) hours as needed for mild pain or moderate pain. Take 2 tablets 1 hour prior to Rituxan treatment.    [name] albuterol (PROAIR HFA) 108 (90 Base) MCG/ACT inhaler Inhale 2 puffs into the lungs at bedtime as needed for wheezing or shortness of breath.     [name] amLODipine (NORVASC) 5 MG tablet TAKE ONE TABLET BY MOUTH ONCE DAILY. 90 tablet 3  . budesonide-formoterol  (SYMBICORT) 160-4.5 MCG/ACT inhaler Inhale 2 puffs into the lungs 2 (two) times daily.    . Cyanocobalamin 1000 MCG/ML KIT Inject 1 mL as directed every 30 (thirty) days.    . diazepam (VALIUM) 5 MG tablet Take 5 mg by mouth at bedtime as needed (sleep).    . fluticasone (FLONASE) 50 MCG/ACT nasal spray Place 1 spray into both nostrils as needed for allergies or rhinitis.    [name] glipiZIDE (GLUCOTROL) 10 MG tablet Take 10 mg by mouth 2 (two) times daily before a meal.     . hydrocortisone 2.5 % cream Apply 1 application topically as needed (itching).    . insulin lispro (HUMALOG) 100 UNIT/ML cartridge Inject 1-10 Units into the skin as directed. On sliding scale when eating a big meal     . lidocaine-prilocaine (EMLA) cream Apply quarter size amount over port site one hour prior to appointment and cover with plastic wrap. 30 g 2  . meclizine (ANTIVERT) 25 MG tablet Take 25 mg by mouth 2 (two) times daily as needed for dizziness.     . pantoprazole (PROTONIX) 40 MG tablet Take 1 tablet (40 mg total) by mouth 2 (two) times daily before a meal. 60 tablet 5  . propranolol (INDERAL) 20 MG tablet Take 1 tablet (20 mg total) by mouth 4 (four) times daily as needed. 120 tablet 12  . testosterone cypionate (DEPOTESTOSTERONE CYPIONATE) 200 MG/ML injection Inject 200 mg into the muscle every 14 (fourteen) days.     [name] tobramycin (TOBREX) 0.3 % ophthalmic solution Place 1 drop into both eyes daily as needed.     Nelva Nay SOLOSTAR 300 UNIT/ML SOPN Inject 55 Units into the skin every morning.      No current facility-administered medications for this visit.    Facility-Administered Medications Ordered in Other Visits  Medication Dose Route Frequency Provider Last Rate Last Dose  . heparin lock flush 100 unit/mL  500 Units Intravenous Once Kefalas, Thomas S, PA-C      . sodium chloride flush (NS) 0.9 % injection 20 mL  20 mL Intravenous PRN Baird Cancer, PA-C        Allergies as of 05/19/2017 - Review  Complete 05/19/2017  Allergen Reaction Noted  . Bee venom Anaphylaxis   . Adhesive [tape] Hives   . Ciprofloxacin    . Codeine    .  Iodine    . Latex    . Povidone-iodine  10/15/2010  . Simvastatin      Vitals: BP 119/76 (BP Location: Right Arm, Patient Position: Sitting, Cuff Size: Large)   Pulse 68   Ht _0  (1.803 m)   Wt 234 lb 6.4 oz (106.3 kg)   BMI 32.69 kg/m  Last Weight:  Wt Readings from Last 1 Encounters:  05/19/17 234 lb 6.4 oz (106.3 kg)   Last Height:   Ht Readings from Last 1 Encounters:  05/19/17 _1  (1.803 m)   MMSE - Mini Mental State Exam 05/19/2017 11/04/2016  Orientation to time 5 5  Orientation to Place 5 5  Registration 3 3  Attention/ Calculation 0 1  Recall 3 1  Language- name 2 objects 2 2  Language- repeat 1 1  Language- follow 3 step command 3 3  Language- read & follow direction 1 1  Write a sentence 0 1  Copy design 1 1  Total score 24 24    Cranial Nerves:  The pupils are equal, round, and reactive to light. Visual fields are full to finger confrontation. Extraocular movements are intact. Trigeminal sensation is intact and the muscles of mastication are normal. The face is symmetric. The palate elevates in the midline. Hearing intact. Voice is normal. Shoulder shrug is normal. The tongue has normal motion without fasciculations.   Coordination:  Normal finger to nose and heel to shin.   Gait:  Mildly wide based  Motor Observation:  No asymmetry, no atrophy, High frequency low amplitude tremor with postural with action components (improved today) Tone:  Normal muscle tone. No cogwheeling. Not increased.   Posture:  Posture is normal. normal erect   Strength:  Strength is V/V in the upper and lower limbs.    Sensation: dec pin prick, temp to the knees. Absent vibration at the toes.    Reflex Exam:  DTR's:  Deep tendon reflexes in the upper and lower extremities are hypo bilaterally.   Toes:  The toes are downgoing bilaterally.  Clonus:  Clonus is absent.    Assessment/Plan: 82 year old patient with what appears to be a familial essential tremor, memory loss (mild cogniitve impairment). Grandmother and sister have tremor. He has a low amplitude, high frequency tremor. I don't see any signs of parkinsonism, he does not shuffle, tone is normal, no resting tremor, not bradykinetic. I think this is an essential tremor. He was given sinemet from another doctor which didn't help because I don't think he has PD. Had side effects to primidone and neurontin. On propranolol and discussed risks in the elderly, hypotension and bradycardia, discussed it at length and he wants to try it.   - Tremor improved on propranolol, no side effects, continue 6m 3x daily can take an additional tablet if needed during the day as tolerated.   - Memory loss:Labs and MRI of the brain - unremarkable. Did not tolerate Aricept. MMSE stable 24/30, mild cognitive impairment. He takes vitamin D daily and B12 shots, continue. Epworth sleep scale 5/10, doesn't appear to need a sleep eval for OSA.  - recommend a daily ASA for stroke prevention,   - Discussed CREAD and trailblazers clinical trial for mild cognitive impairment.   tsh normal, he is on oral B12 due to b12 deficiency and follows with pcp.  ASarina Ill MD  [facility] [address] [address] Phone [phone] [phone] A total of 25 minutes  was spent in with this patient. Over half this time was spent on counseling patient on the diagnosis and different therapeutic options available.

## 2017-05-27 ENCOUNTER — Telehealth (INDEPENDENT_AMBULATORY_CARE_PROVIDER_SITE_OTHER): Payer: Self-pay | Admitting: Internal Medicine

## 2017-05-27 NOTE — Telephone Encounter (Signed)
I left message on phone stating he should go to the ED.

## 2017-05-27 NOTE — Telephone Encounter (Signed)
Patient called, stated that he's having awful stomach pains, lower intestine pain.  He wants to know if there was anything you can give him or if he needs to come in.    [phone]

## 2017-05-29 DIAGNOSIS — N393 Stress incontinence (female) (male): Secondary | ICD-10-CM | POA: Diagnosis not present

## 2017-05-29 DIAGNOSIS — Z8546 Personal history of malignant neoplasm of prostate: Secondary | ICD-10-CM | POA: Diagnosis not present

## 2017-05-29 DIAGNOSIS — R1032 Left lower quadrant pain: Secondary | ICD-10-CM | POA: Diagnosis not present

## 2017-05-29 DIAGNOSIS — N200 Calculus of kidney: Secondary | ICD-10-CM | POA: Diagnosis not present

## 2017-05-29 DIAGNOSIS — N359 Urethral stricture, unspecified: Secondary | ICD-10-CM | POA: Diagnosis not present

## 2017-05-29 DIAGNOSIS — E291 Testicular hypofunction: Secondary | ICD-10-CM | POA: Diagnosis not present

## 2017-05-29 DIAGNOSIS — N281 Cyst of kidney, acquired: Secondary | ICD-10-CM | POA: Diagnosis not present

## 2017-05-29 DIAGNOSIS — N50819 Testicular pain, unspecified: Secondary | ICD-10-CM | POA: Diagnosis not present

## 2017-05-29 DIAGNOSIS — N529 Male erectile dysfunction, unspecified: Secondary | ICD-10-CM | POA: Diagnosis not present

## 2017-05-29 DIAGNOSIS — N183 Chronic kidney disease, stage 3 (moderate): Secondary | ICD-10-CM | POA: Diagnosis not present

## 2017-05-29 DIAGNOSIS — E538 Deficiency of other specified B group vitamins: Secondary | ICD-10-CM | POA: Diagnosis not present

## 2017-05-29 DIAGNOSIS — N5082 Scrotal pain: Secondary | ICD-10-CM | POA: Diagnosis not present

## 2017-05-29 DIAGNOSIS — G8929 Other chronic pain: Secondary | ICD-10-CM | POA: Diagnosis not present

## 2017-05-29 DIAGNOSIS — E539 Vitamin B deficiency, unspecified: Secondary | ICD-10-CM | POA: Diagnosis not present

## 2017-06-02 DIAGNOSIS — B351 Tinea unguium: Secondary | ICD-10-CM | POA: Diagnosis not present

## 2017-06-02 DIAGNOSIS — E1142 Type 2 diabetes mellitus with diabetic polyneuropathy: Secondary | ICD-10-CM | POA: Diagnosis not present

## 2017-06-02 DIAGNOSIS — M79675 Pain in left toe(s): Secondary | ICD-10-CM | POA: Diagnosis not present

## 2017-06-02 DIAGNOSIS — M79674 Pain in right toe(s): Secondary | ICD-10-CM | POA: Diagnosis not present

## 2017-06-04 ENCOUNTER — Ambulatory Visit (HOSPITAL_COMMUNITY): Payer: Medicare Other

## 2017-06-09 ENCOUNTER — Encounter (HOSPITAL_COMMUNITY): Payer: Medicare Other | Attending: Hematology and Oncology

## 2017-06-09 ENCOUNTER — Encounter (HOSPITAL_COMMUNITY): Payer: Self-pay

## 2017-06-09 VITALS — BP 138/97 | HR 67 | Temp 98.1°F | Resp 18

## 2017-06-09 DIAGNOSIS — E538 Deficiency of other specified B group vitamins: Secondary | ICD-10-CM

## 2017-06-09 DIAGNOSIS — C8310 Mantle cell lymphoma, unspecified site: Secondary | ICD-10-CM | POA: Insufficient documentation

## 2017-06-09 DIAGNOSIS — C8319 Mantle cell lymphoma, extranodal and solid organ sites: Secondary | ICD-10-CM

## 2017-06-09 MED ORDER — CYANOCOBALAMIN 1000 MCG/ML IJ SOLN
1000.0000 ug | Freq: Once | INTRAMUSCULAR | Status: AC
  Administered 2017-06-09: 1000 ug via INTRAMUSCULAR

## 2017-06-09 MED ORDER — CYANOCOBALAMIN 1000 MCG/ML IJ SOLN
INTRAMUSCULAR | Status: AC
  Filled 2017-06-09: qty 1

## 2017-06-09 NOTE — Progress Notes (Signed)
[name] presents today for injection per MD orders. B12 1000 mcg administered IM in right deltoid. Administration without incident. Patient tolerated well. Patient tolerated treatment without incidence. Patient discharged ambulatory and in stable condition from clinic. Patient to follow up as scheduled.

## 2017-06-09 NOTE — Patient Instructions (Signed)
[place] at [facility] Discharge Instructions  RECOMMENDATIONS MADE BY THE CONSULTANT AND ANY TEST RESULTS WILL BE SENT TO YOUR REFERRING PHYSICIAN.  You were given your B-12 injection today Continue to get it monthly. Follow up as scheduled.  Thank you for choosing [place] at [facility] to provide your oncology and hematology care.  To afford each patient quality time with our provider, please arrive at least 15 minutes before your scheduled appointment time.    If you have a lab appointment with the [facility] please come in thru the  Main Entrance and check in at the main information desk  You need to re-schedule your appointment should you arrive 10 or more minutes late.  We strive to give you quality time with our providers, and arriving late affects you and other patients whose appointments are after yours.  Also, if you no show three or more times for appointments you may be dismissed from the clinic at the providers discretion.     Again, thank you for choosing [facility].  Our hope is that these requests will decrease the amount of time that you wait before being seen by our physicians.       _____________________________________________________________  Should you have questions after your visit to [facility], please contact our office at [phone] between the hours of 8:30 a.m. and 4:30 p.m.  Voicemails left after 4:30 p.m. will not be returned until the following business day.  For prescription refill requests, have your pharmacy contact our office.       Resources For Cancer Patients and their Caregivers ? American Cancer Society: Can assist with transportation, wigs, general needs, runs Look Good Feel Better.        747-313-8454 ? Cancer Care: Provides financial assistance, online support groups, medication/co-pay assistance.  1-800-813-HOPE 3080852587) ? [facility] Assists [place] cancer patients and their families through emotional , educational and financial support.  650-108-6211 ? [place] DSS Where to apply for food stamps, Medicaid and utility assistance. (479)637-7519 ? [facility]: Transportation to medical appointments. 631-673-5953 ? Social Security Administration: May apply for disability if have a Stage IV cancer. 225-671-7525 (412)494-8178 ? [employer], Disability and Transit Services: Assists with nutrition, care and transit needs. [place] Support Programs: @10RELATIVEDAYS @ > Cancer Support Group  2nd Tuesday of the month 1pm-2pm, Journey Room  > Creative Journey  3rd Tuesday of the month 1130am-1pm, Journey Room  > Look Good Feel Better  1st Wednesday of the month 10am-12 noon, Journey Room (Call [place] to register [phone])

## 2017-06-10 DIAGNOSIS — E1122 Type 2 diabetes mellitus with diabetic chronic kidney disease: Secondary | ICD-10-CM | POA: Diagnosis not present

## 2017-06-10 DIAGNOSIS — N182 Chronic kidney disease, stage 2 (mild): Secondary | ICD-10-CM | POA: Diagnosis not present

## 2017-06-10 DIAGNOSIS — D519 Vitamin B12 deficiency anemia, unspecified: Secondary | ICD-10-CM | POA: Diagnosis not present

## 2017-06-10 DIAGNOSIS — E782 Mixed hyperlipidemia: Secondary | ICD-10-CM | POA: Diagnosis not present

## 2017-06-10 DIAGNOSIS — E291 Testicular hypofunction: Secondary | ICD-10-CM | POA: Diagnosis not present

## 2017-06-10 DIAGNOSIS — F5101 Primary insomnia: Secondary | ICD-10-CM | POA: Diagnosis not present

## 2017-06-13 DIAGNOSIS — E782 Mixed hyperlipidemia: Secondary | ICD-10-CM | POA: Diagnosis not present

## 2017-06-13 DIAGNOSIS — E291 Testicular hypofunction: Secondary | ICD-10-CM | POA: Diagnosis not present

## 2017-06-13 DIAGNOSIS — C61 Malignant neoplasm of prostate: Secondary | ICD-10-CM | POA: Diagnosis not present

## 2017-06-13 DIAGNOSIS — I10 Essential (primary) hypertension: Secondary | ICD-10-CM | POA: Diagnosis not present

## 2017-06-13 DIAGNOSIS — E119 Type 2 diabetes mellitus without complications: Secondary | ICD-10-CM | POA: Diagnosis not present

## 2017-06-17 DIAGNOSIS — F5101 Primary insomnia: Secondary | ICD-10-CM | POA: Diagnosis not present

## 2017-06-17 DIAGNOSIS — E1122 Type 2 diabetes mellitus with diabetic chronic kidney disease: Secondary | ICD-10-CM | POA: Diagnosis not present

## 2017-06-17 DIAGNOSIS — E782 Mixed hyperlipidemia: Secondary | ICD-10-CM | POA: Diagnosis not present

## 2017-06-17 DIAGNOSIS — N182 Chronic kidney disease, stage 2 (mild): Secondary | ICD-10-CM | POA: Diagnosis not present

## 2017-06-17 DIAGNOSIS — E291 Testicular hypofunction: Secondary | ICD-10-CM | POA: Diagnosis not present

## 2017-06-23 DIAGNOSIS — E291 Testicular hypofunction: Secondary | ICD-10-CM | POA: Diagnosis not present

## 2017-07-03 ENCOUNTER — Other Ambulatory Visit (HOSPITAL_COMMUNITY): Payer: Medicare Other

## 2017-07-03 ENCOUNTER — Ambulatory Visit (HOSPITAL_COMMUNITY): Payer: Medicare Other

## 2017-07-07 DIAGNOSIS — E291 Testicular hypofunction: Secondary | ICD-10-CM | POA: Diagnosis not present

## 2017-07-14 ENCOUNTER — Encounter (HOSPITAL_COMMUNITY): Payer: Self-pay | Admitting: Oncology

## 2017-07-14 ENCOUNTER — Encounter (HOSPITAL_BASED_OUTPATIENT_CLINIC_OR_DEPARTMENT_OTHER): Payer: Medicare Other

## 2017-07-14 ENCOUNTER — Encounter (HOSPITAL_COMMUNITY): Payer: Medicare Other | Attending: Hematology and Oncology | Admitting: Oncology

## 2017-07-14 VITALS — BP 155/88 | HR 114 | Temp 98.3°F | Resp 18 | Wt 231.9 lb

## 2017-07-14 DIAGNOSIS — E538 Deficiency of other specified B group vitamins: Secondary | ICD-10-CM

## 2017-07-14 DIAGNOSIS — C8319 Mantle cell lymphoma, extranodal and solid organ sites: Secondary | ICD-10-CM

## 2017-07-14 DIAGNOSIS — C8310 Mantle cell lymphoma, unspecified site: Secondary | ICD-10-CM

## 2017-07-14 LAB — LACTATE DEHYDROGENASE: LDH: 136 U/L (ref 98–192)

## 2017-07-14 LAB — COMPREHENSIVE METABOLIC PANEL
ALBUMIN: 4.1 g/dL (ref 3.5–5.0)
ALK PHOS: 57 U/L (ref 38–126)
ALT: 17 U/L (ref 17–63)
AST: 22 U/L (ref 15–41)
Anion gap: 9 (ref 5–15)
BILIRUBIN TOTAL: 1.2 mg/dL (ref 0.3–1.2)
BUN: 21 mg/dL — AB (ref 6–20)
CALCIUM: 8.7 mg/dL — AB (ref 8.9–10.3)
CO2: 26 mmol/L (ref 22–32)
Chloride: 100 mmol/L — ABNORMAL LOW (ref 101–111)
Creatinine, Ser: 1.6 mg/dL — ABNORMAL HIGH (ref 0.61–1.24)
GFR calc Af Amer: 46 mL/min — ABNORMAL LOW (ref >60)
GFR, EST NON AFRICAN AMERICAN: 39 mL/min — AB (ref >60)
GLUCOSE: 408 mg/dL — AB (ref 65–99)
POTASSIUM: 3.8 mmol/L (ref 3.5–5.1)
Sodium: 135 mmol/L (ref 135–145)
TOTAL PROTEIN: 6.7 g/dL (ref 6.5–8.1)

## 2017-07-14 LAB — CBC WITH DIFFERENTIAL/PLATELET
BASOS PCT: 0 %
Basophils Absolute: 0 10*3/uL (ref 0.0–0.1)
Eosinophils Absolute: 0.1 10*3/uL (ref 0.0–0.7)
Eosinophils Relative: 1 %
HEMATOCRIT: 46.2 % (ref 39.0–52.0)
HEMOGLOBIN: 15.3 g/dL (ref 13.0–17.0)
LYMPHS PCT: 11 %
Lymphs Abs: 0.8 10*3/uL (ref 0.7–4.0)
MCH: 30.5 pg (ref 26.0–34.0)
MCHC: 33.1 g/dL (ref 30.0–36.0)
MCV: 92 fL (ref 78.0–100.0)
MONO ABS: 0.4 10*3/uL (ref 0.1–1.0)
Monocytes Relative: 6 %
NEUTROS ABS: 5.8 10*3/uL (ref 1.7–7.7)
NEUTROS PCT: 82 %
Platelets: 102 10*3/uL — ABNORMAL LOW (ref 150–400)
RBC: 5.02 MIL/uL (ref 4.22–5.81)
RDW: 16.6 % — AB (ref 11.5–15.5)
WBC: 7.1 10*3/uL (ref 4.0–10.5)

## 2017-07-14 MED ORDER — SODIUM CHLORIDE 0.9% FLUSH
10.0000 mL | INTRAVENOUS | Status: DC | PRN
  Administered 2017-07-14: 10 mL via INTRAVENOUS
  Filled 2017-07-14: qty 10

## 2017-07-14 MED ORDER — HEPARIN SOD (PORK) LOCK FLUSH 100 UNIT/ML IV SOLN
500.0000 [IU] | Freq: Once | INTRAVENOUS | Status: AC
  Administered 2017-07-14: 500 [IU] via INTRAVENOUS
  Filled 2017-07-14: qty 5

## 2017-07-14 MED ORDER — CYANOCOBALAMIN 1000 MCG/ML IJ SOLN
1000.0000 ug | Freq: Once | INTRAMUSCULAR | Status: AC
  Administered 2017-07-14: 1000 ug via INTRAMUSCULAR

## 2017-07-14 MED ORDER — CYANOCOBALAMIN 1000 MCG/ML IJ SOLN
INTRAMUSCULAR | Status: AC
  Filled 2017-07-14: qty 1

## 2017-07-14 NOTE — Progress Notes (Signed)
Celene Squibb, MD [place] [address]  Mantle cell lymphoma, unspecified body region [facility]) - Plan: CBC with Differential, Comprehensive metabolic panel, Lactate dehydrogenase, CBC with Differential, Comprehensive metabolic panel, Lactate dehydrogenase  CURRENT THERAPY: Surveillance per NCCN guidelines  INTERVAL HISTORY: [name] 82 y.o. male returns for followup of Mantle Cell lymphoma of the large intestine diagnosed 08/2013, S/P BR x 6 cycles from 09/14/2013- 02/07/2014, followed by maintenance Rituxan 05/10/2014- 04/01/2016. Colonoscopy on 02/28/2014 by Dr. Laural Golden demonstrated NED.     Mantle cell lymphoma ([place])   08/27/2013 Initial Diagnosis    Colon, biopsy, random - MANTLE CELL LYMPHOMA      09/06/2013 Imaging    CT CAP- No significant lymphadenopathy identified within the chest, abdomen or pelvis.      09/14/2013 - 02/07/2014 Chemotherapy    Bendamustine/Rituxan x 6 cycles with Neulasta support      12/17/2013 Procedure    Colonoscopy with biopsy by Dr. Laural Golden.      12/17/2013 Pathology Results    Colon, biopsy, ascending and transverse - BENIGN APPEARING COLONIC MUCOSA WITH SCATTERED ATYPICAL LYMPHOCYTES.Overall, these findings are suspicious for minimal residual mantle cell lymphoma.      02/28/2014 Procedure    Colonoscopy with biopsy by Dr. Laural Golden      02/28/2014 Pathology Results    Colon, biopsy, proximal and distal - BENIGN COLONIC MUCOSA. - NO SIGNIFICANT INFLAMMATION OR OTHER ABNORMALITIES IDENTIFIED. - NO EVIDENCE OF LYMPHOMA, ADENOMATOUS CHANGES OR MALIGNANCY.      02/28/2014 Remission    No evidence of malignancy on colonosocpy and pathology from biopsy.      05/10/2014 - 04/01/2016 Chemotherapy    Maintenance Rituxan 500 mg/m x 2 years.      05/11/2015 Imaging    No definite findings to suggest metastatic disease in chest, abdomen or pelvis, new ground glass atten in lungs B, nonspecific, repeat 6 to 12 mo      06/30/2015 - 07/02/2015 Hospital Admission    [facility]      08/04/2015 Procedure    Colonoscopy with biopsy by Dr. Laural Golden.      08/04/2015 Pathology Results    Colon, biopsy, right and left. - BENIGN COLORECTAL MUCOSA. - ASSOCIATED BENIGN LYMPHOID AGGREGATES. - NO EVIDENCE OF SIGNIFICANT INFLAMMATION, DYSPLASIA OR MALIGNANCY.      05/10/2016 Imaging    CT CAP- 1. No definite findings to suggest metastatic disease in the chest, abdomen or pelvis. There are several new patchy areas of ground-glass attenuation in the lungs bilaterally which are highly nonspecific. The possibility of developing interstitial lung disease is not excluded, and repeat high-resolution chest CT is suggested in 6-12 months to assess for temporal changes in the appearance of the lung parenchyma.       06/17/2016 Imaging    CT chest high resolution- 1. No convincing findings of interstitial lung disease. Minimal subpleural reticulation and ground-glass attenuation in the dependent lower lobes is not significantly changed since 05/11/2015, and appears slightly less prominent on the inspiratory sequence, suggesting a combination of mild hypoventilatory change and minimal nonspecific scarring. No traction bronchiectasis or frank honeycombing. 2. Two tiny solid pulmonary nodules in the right middle lobe and left lower lobe are stable and probably benign. 3. Left lower lobe ground-glass 11 mm nodule is stable. Given persistence, repeat CT is recommended every 2 years until 5 years of stability has been established. This recommendation follows the consensus statement: Guidelines for Management of Incidental Pulmonary  Nodules Detected on CT Images: From the Fleischner Society 2017; Radiology 2017; 284:228-243. 4. Additional findings include aortic atherosclerosis and coronary atherosclerosis.      08/19/2016 Imaging    CT abd/pelvis- 1. Tiny nonobstructive stones in the right kidney. No cause for left flank pain  identified. 2. Diverticulosis without focal diverticulitis. 3. Atherosclerosis. 4. Rounded density over the left lower chest on the scout view only, not seen on the June 17, 2016 CT is likely something on the patient's such as an EKG lead. Recommend clinical correlation. 5. Cholelithiasis.       Mr. Godlewski presents today unaccompanied for a follow up. I personally reviewed and went over labs with the patient.  He states he has been doing well and has no complaints today.   Denies chest pain, sob, abdominal pain, drenching night sweats, unexplained weight loss, fatigue.    Review of Systems  Constitutional: Negative.  Negative for malaise/fatigue and weight loss.       Denies drenching night sweats.  HENT: Negative.   Eyes: Negative.   Respiratory: Negative.  Negative for shortness of breath.   Cardiovascular: Negative.  Negative for chest pain.  Gastrointestinal: Negative.  Negative for abdominal pain.  Genitourinary: Negative.   Musculoskeletal: Negative.   Skin: Negative.   Neurological: Negative.   Endo/Heme/Allergies: Negative.   Psychiatric/Behavioral: Negative.   All other systems reviewed and are negative.   Past Medical History:  Diagnosis Date  . B12 deficiency 02/07/2014  . Colon cancer ([place])   . 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 Essential hypertension, benign   . GERD (gastroesophageal reflux disease)   . Mixed hyperlipidemia   . Obstructive sleep apnea    does not use  . Prostate cancer ([place])   . PVD (peripheral vascular disease) ([place])   . Type 2 diabetes mellitus ([place])     Past Surgical History:  Procedure Laterality Date  . BACK SURGERY    . BALLOON DILATION N/A 08/27/2013   Procedure: BALLOON DILATION;  Surgeon: Rogene Houston, MD;  [facility];  Service: Endoscopy;  Laterality: N/A;  . COLON SURGERY    . COLONOSCOPY N/A 12/17/2013     Procedure: COLONOSCOPY;  Surgeon: Rogene Houston, MD;  [facility];  Service: Endoscopy;  Laterality: N/A;  940  . COLONOSCOPY N/A 02/28/2014   Procedure: COLONOSCOPY;  Surgeon: Rogene Houston, MD;  [facility];  Service: Endoscopy;  Laterality: N/A;  730  . COLONOSCOPY N/A 08/04/2015   Procedure: COLONOSCOPY;  Surgeon: Rogene Houston, MD;  [facility];  Service: Endoscopy;  Laterality: N/A;  200 - moved to 11/11 @ 2:10 - Ann to notify pt  . COLONOSCOPY WITH ESOPHAGOGASTRODUODENOSCOPY (EGD) N/A 08/27/2013   Procedure: COLONOSCOPY WITH ESOPHAGOGASTRODUODENOSCOPY (EGD);  Surgeon: Rogene Houston, MD;  [facility];  Service: Endoscopy;  Laterality: N/A;  730  . KNEE ARTHROSCOPY Left   . KNEE SURGERY Right    total knee  . MALONEY DILATION N/A 08/27/2013   Procedure: Venia Minks DILATION;  Surgeon: Rogene Houston, MD;  [facility];  Service: Endoscopy;  Laterality: N/A;  . PORTACATH PLACEMENT Right 2014  . PROSTATECTOMY    . removal of port Right   . SAVORY DILATION N/A 08/27/2013   Procedure: SAVORY DILATION;  Surgeon: Rogene Houston, MD;  [facility];  Service: Endoscopy;  Laterality: N/A;  . SHOULDER SURGERY    . TONSILLECTOMY    . VASECTOMY      Family History  Problem Relation Age of Onset  . Colon cancer Brother   . Cancer Brother   . Diabetes Father   . Cancer Brother     Social History   Social History  . Marital status: Married    Spouse name: [name]  . Number of children: 2  . Years of education: 8   Occupational History  . Retired  Retired   Social History Main Topics  . Smoking status: Never Smoker  . Smokeless tobacco: Never Used  . Alcohol use No  . Drug use: No  . Sexual activity: Yes    Birth control/ protection: None   Other Topics Concern  . None   Social History Narrative   Lives with wife   Caffeine use: none   Right-handed     PHYSICAL EXAMINATION  ECOG PERFORMANCE  STATUS: 1 - Symptomatic but completely ambulatory  BP 155/88, P114 RR 18 T 98.3 O2sat 95%  Physical Exam  Constitutional: He is oriented to person, place, and time and well-developed, well-nourished, and in no distress.  HENT:  Head: Normocephalic and atraumatic.  Eyes: Pupils are equal, round, and reactive to light. Conjunctivae and EOM are normal.  Neck: Normal range of motion. Neck supple.  Cardiovascular: Regular rhythm and normal heart sounds.   tachycardic  Pulmonary/Chest: Effort normal and breath sounds normal.  Abdominal: Soft. Bowel sounds are normal.  Musculoskeletal: Normal range of motion.  Lymphadenopathy:    He has no cervical adenopathy.    He has no axillary adenopathy.       Right: No supraclavicular adenopathy present.       Left: No supraclavicular adenopathy present.  Neurological: He is alert and oriented to person, place, and time. Gait normal.  Skin: Skin is warm and dry.  Nursing note and vitals reviewed.    LABORATORY DATA: CBC    Component Value Date/Time   WBC 7.1 07/14/2017 1002   RBC 5.02 07/14/2017 1002   HGB 15.3 07/14/2017 1002   HCT 46.2 07/14/2017 1002   PLT 102 (L) 07/14/2017 1002   MCV 92.0 07/14/2017 1002   MCH 30.5 07/14/2017 1002   MCHC 33.1 07/14/2017 1002   RDW 16.6 (H) 07/14/2017 1002   LYMPHSABS 0.8 07/14/2017 1002   MONOABS 0.4 07/14/2017 1002   EOSABS 0.1 07/14/2017 1002   BASOSABS 0.0 07/14/2017 1002      Chemistry      Component Value Date/Time   NA 135 07/14/2017 1002   K 3.8 07/14/2017 1002   CL 100 (L) 07/14/2017 1002   CO2 26 07/14/2017 1002   BUN 21 (H) 07/14/2017 1002   CREATININE 1.60 (H) 07/14/2017 1002   CREATININE 1.17 11/28/2014 0955      Component Value Date/Time   CALCIUM 8.7 (L) 07/14/2017 1002   ALKPHOS 57 07/14/2017 1002   AST 22 07/14/2017 1002   ALT 17 07/14/2017 1002   BILITOT 1.2 07/14/2017 1002        PENDING LABS:   RADIOGRAPHIC STUDIES: I have personally reviewed the  radiological images as listed and agreed with the findings in the report. No results found.  CT CHEST WITHOUT CONTRAST 09/09/2016  IMPRESSION: No lesion is seen corresponding to the area of concern over the left lower hemithorax seen on the scout tomogram of most recent study. There is currently no chest wall lesion. There is no new  parenchymal lung lesion. The largest ground-glass nodular lesions seen on previous study is smaller currently. As this lesion does persist, recommendations from CT examination September 2017 remain in effect. No new nodular opacities. No adenopathy. There is atherosclerotic calcification at multiple sites including multiple foci of coronary artery calcification.   NUCLEAR MEDICINE GASTRIC EMPTYING SCAN 09/27/2016  IMPRESSION: Normal gastric emptying study.   PATHOLOGY:    ASSESSMENT AND PLAN:  Mantle Cell lymphoma of the large intestine diagnosed 08/2013, S/P BR x 6 cycles from 09/14/2013- 02/07/2014, followed by maintenance Rituxan 05/10/2014- 04/01/2016. Chronic mild thrombocytopenia  PLAN: Clinically NED. No B symptoms.  Labs reviewed. Results noted above.  He will have his port flushed today and q2 months.   He will get CBC, CMP, and LDH labs drawn on his next visit.    RTC in 4 months with repeat labs.  THERAPY PLAN:  Surveillance for Mantle Cell Lymphoma following complete response to therapy per NCCN guidelines (4.2017):  A. Clinical follow-up every 3-6 months for 5 years and then annually, or as clinically indicated.    All questions were answered. The patient knows to call the clinic with any problems, questions or concerns. We can certainly see the patient much sooner if necessary.    This note is electronically signed by: Twana First, MD 07/14/2017 10:34 AM

## 2017-07-14 NOTE — Progress Notes (Signed)
[name] presents today for injection per MD orders. B12 1,056mcg administered SQ in right Upper Arm. Administration without incident. Patient tolerated well.  [name] presented for Portacath access and flush. Portacath located right chest wall accessed with  H 20 needle. Good blood return present. Portacath flushed with 30ml NS and 500U/5ml Heparin and needle removed intact. Procedure without incident. Patient tolerated procedure well.  Labs drawn as well.  Treatment given per orders. Patient tolerated it well without problems. Vitals stable and discharged home from clinic ambulatory. Follow up as scheduled.

## 2017-07-14 NOTE — Patient Instructions (Signed)
[place] at [facility] Discharge Instructions  RECOMMENDATIONS MADE BY THE CONSULTANT AND ANY TEST RESULTS WILL BE SENT TO YOUR REFERRING PHYSICIAN.  Port flush done with labs B12 injection done today. Follow up as scheduled.  Thank you for choosing [place] at [facility] to provide your oncology and hematology care.  To afford each patient quality time with our provider, please arrive at least 15 minutes before your scheduled appointment time.    If you have a lab appointment with the [facility] please come in thru the  Main Entrance and check in at the main information desk  You need to re-schedule your appointment should you arrive 10 or more minutes late.  We strive to give you quality time with our providers, and arriving late affects you and other patients whose appointments are after yours.  Also, if you no show three or more times for appointments you may be dismissed from the clinic at the providers discretion.     Again, thank you for choosing [facility].  Our hope is that these requests will decrease the amount of time that you wait before being seen by our physicians.       _____________________________________________________________  Should you have questions after your visit to [facility], please contact our office at [phone] between the hours of 8:30 a.m. and 4:30 p.m.  Voicemails left after 4:30 p.m. will not be returned until the following business day.  For prescription refill requests, have your pharmacy contact our office.       Resources For Cancer Patients and their Caregivers ? American Cancer Society: Can assist with transportation, wigs, general needs, runs Look Good Feel Better.        646-497-5367 ? Cancer Care: Provides financial assistance, online support groups, medication/co-pay assistance.  1-800-813-HOPE (303)448-9815) ? [place] Assists  [place] cancer patients and their families through emotional , educational and financial support.  (202) 570-1925 ? [place] DSS Where to apply for food stamps, Medicaid and utility assistance. 705-338-1607 ? [employer]ATS: Transportation to medical appointments. (234)423-0801 ? Social Security Administration: May apply for disability if have a Stage IV cancer. 8483493090 913-650-3990 ? [employer], Disability and Transit Services: Assists with nutrition, care and transit needs. [place] Support Programs: @10RELATIVEDAYS @ > Cancer Support Group  2nd Tuesday of the month 1pm-2pm, Journey Room  > Creative Journey  3rd Tuesday of the month 1130am-1pm, Journey Room  > Look Good Feel Better  1st Wednesday of the month 10am-12 noon, Journey Room (Call [place] to register [phone])

## 2017-07-17 DIAGNOSIS — H8149 Vertigo of central origin, unspecified ear: Secondary | ICD-10-CM | POA: Diagnosis not present

## 2017-07-17 DIAGNOSIS — H60519 Acute actinic otitis externa, unspecified ear: Secondary | ICD-10-CM | POA: Diagnosis not present

## 2017-07-21 DIAGNOSIS — E291 Testicular hypofunction: Secondary | ICD-10-CM | POA: Diagnosis not present

## 2017-07-28 DIAGNOSIS — E291 Testicular hypofunction: Secondary | ICD-10-CM | POA: Diagnosis not present

## 2017-07-29 ENCOUNTER — Other Ambulatory Visit (INDEPENDENT_AMBULATORY_CARE_PROVIDER_SITE_OTHER): Payer: Self-pay | Admitting: Internal Medicine

## 2017-07-29 DIAGNOSIS — K219 Gastro-esophageal reflux disease without esophagitis: Secondary | ICD-10-CM

## 2017-07-31 ENCOUNTER — Ambulatory Visit (INDEPENDENT_AMBULATORY_CARE_PROVIDER_SITE_OTHER): Payer: Medicare Other | Admitting: Otolaryngology

## 2017-07-31 DIAGNOSIS — H903 Sensorineural hearing loss, bilateral: Secondary | ICD-10-CM | POA: Diagnosis not present

## 2017-07-31 DIAGNOSIS — H6983 Other specified disorders of Eustachian tube, bilateral: Secondary | ICD-10-CM | POA: Diagnosis not present

## 2017-08-11 DIAGNOSIS — E291 Testicular hypofunction: Secondary | ICD-10-CM | POA: Diagnosis not present

## 2017-08-21 ENCOUNTER — Encounter (HOSPITAL_COMMUNITY): Payer: Medicare Other | Attending: Hematology and Oncology

## 2017-08-21 ENCOUNTER — Other Ambulatory Visit: Payer: Self-pay

## 2017-08-21 ENCOUNTER — Encounter (HOSPITAL_COMMUNITY): Payer: Self-pay

## 2017-08-21 VITALS — BP 153/87 | HR 70 | Temp 98.1°F | Resp 20

## 2017-08-21 DIAGNOSIS — C8310 Mantle cell lymphoma, unspecified site: Secondary | ICD-10-CM | POA: Insufficient documentation

## 2017-08-21 DIAGNOSIS — E538 Deficiency of other specified B group vitamins: Secondary | ICD-10-CM | POA: Diagnosis present

## 2017-08-21 DIAGNOSIS — C8319 Mantle cell lymphoma, extranodal and solid organ sites: Secondary | ICD-10-CM

## 2017-08-21 MED ORDER — CYANOCOBALAMIN 1000 MCG/ML IJ SOLN
1000.0000 ug | Freq: Once | INTRAMUSCULAR | Status: AC
  Administered 2017-08-21: 1000 ug via INTRAMUSCULAR

## 2017-08-21 MED ORDER — CYANOCOBALAMIN 1000 MCG/ML IJ SOLN
INTRAMUSCULAR | Status: AC
Start: 2017-08-21 — End: ?
  Filled 2017-08-21: qty 1

## 2017-08-21 NOTE — Progress Notes (Signed)
Charly C Guerrero presents today for injection per MD orders. B12 1,000 mcg administered IM  in right Upper Arm. Administration without incident. Patient tolerated well.  Treatment given per orders. Patient tolerated it well without problems. Vitals stable and discharged home from clinic ambulatory. Follow up as scheduled.   

## 2017-08-21 NOTE — Patient Instructions (Signed)
[place] at [facility] Discharge Instructions  RECOMMENDATIONS MADE BY THE CONSULTANT AND ANY TEST RESULTS WILL BE SENT TO YOUR REFERRING PHYSICIAN.  B12 injection given  Follow up as scheduled.  Thank you for choosing [place] at [facility] to provide your oncology and hematology care.  To afford each patient quality time with our provider, please arrive at least 15 minutes before your scheduled appointment time.    If you have a lab appointment with the [facility] please come in thru the  Main Entrance and check in at the main information desk  You need to re-schedule your appointment should you arrive 10 or more minutes late.  We strive to give you quality time with our providers, and arriving late affects you and other patients whose appointments are after yours.  Also, if you no show three or more times for appointments you may be dismissed from the clinic at the providers discretion.     Again, thank you for choosing [facility].  Our hope is that these requests will decrease the amount of time that you wait before being seen by our physicians.       _____________________________________________________________  Should you have questions after your visit to [facility], please contact our office at [phone] between the hours of 8:30 a.m. and 4:30 p.m.  Voicemails left after 4:30 p.m. will not be returned until the following business day.  For prescription refill requests, have your pharmacy contact our office.       Resources For Cancer Patients and their Caregivers ? American Cancer Society: Can assist with transportation, wigs, general needs, runs Look Good Feel Better.        628-362-4669 ? Cancer Care: Provides financial assistance, online support groups, medication/co-pay assistance.  1-800-813-HOPE 215 737 3677) ? [place] Assists [place] cancer patients and  their families through emotional , educational and financial support.  971 510 6146 ? [place] DSS Where to apply for food stamps, Medicaid and utility assistance. (704)838-7490 ? [employer]: Transportation to medical appointments. (901)869-2940 ? Social Security Administration: May apply for disability if have a Stage IV cancer. 601-619-0956 817-303-4226 ? [employer], Disability and Transit Services: Assists with nutrition, care and transit needs. [place] Support Programs: @10RELATIVEDAYS @ > Cancer Support Group  2nd Tuesday of the month 1pm-2pm, Journey Room  > Creative Journey  3rd Tuesday of the month 1130am-1pm, Journey Room  > Look Good Feel Better  1st Wednesday of the month 10am-12 noon, Journey Room (Call [place] to register [phone])

## 2017-08-26 ENCOUNTER — Ambulatory Visit: Payer: Medicare Other | Admitting: Nurse Practitioner

## 2017-08-29 ENCOUNTER — Encounter: Payer: Self-pay | Admitting: Nurse Practitioner

## 2017-08-29 DIAGNOSIS — M79674 Pain in right toe(s): Secondary | ICD-10-CM | POA: Diagnosis not present

## 2017-08-29 DIAGNOSIS — E1142 Type 2 diabetes mellitus with diabetic polyneuropathy: Secondary | ICD-10-CM | POA: Diagnosis not present

## 2017-08-29 DIAGNOSIS — E291 Testicular hypofunction: Secondary | ICD-10-CM | POA: Diagnosis not present

## 2017-08-29 DIAGNOSIS — B351 Tinea unguium: Secondary | ICD-10-CM | POA: Diagnosis not present

## 2017-08-29 DIAGNOSIS — M79675 Pain in left toe(s): Secondary | ICD-10-CM | POA: Diagnosis not present

## 2017-09-05 DIAGNOSIS — L570 Actinic keratosis: Secondary | ICD-10-CM | POA: Diagnosis not present

## 2017-09-05 DIAGNOSIS — L821 Other seborrheic keratosis: Secondary | ICD-10-CM | POA: Diagnosis not present

## 2017-09-05 DIAGNOSIS — L820 Inflamed seborrheic keratosis: Secondary | ICD-10-CM | POA: Diagnosis not present

## 2017-09-05 DIAGNOSIS — D1801 Hemangioma of skin and subcutaneous tissue: Secondary | ICD-10-CM | POA: Diagnosis not present

## 2017-09-05 DIAGNOSIS — C44311 Basal cell carcinoma of skin of nose: Secondary | ICD-10-CM | POA: Diagnosis not present

## 2017-09-05 DIAGNOSIS — L245 Irritant contact dermatitis due to other chemical products: Secondary | ICD-10-CM | POA: Diagnosis not present

## 2017-09-05 DIAGNOSIS — Z85828 Personal history of other malignant neoplasm of skin: Secondary | ICD-10-CM | POA: Diagnosis not present

## 2017-09-05 DIAGNOSIS — L720 Epidermal cyst: Secondary | ICD-10-CM | POA: Diagnosis not present

## 2017-09-05 DIAGNOSIS — D485 Neoplasm of uncertain behavior of skin: Secondary | ICD-10-CM | POA: Diagnosis not present

## 2017-09-11 ENCOUNTER — Ambulatory Visit (INDEPENDENT_AMBULATORY_CARE_PROVIDER_SITE_OTHER): Payer: Medicare Other | Admitting: Otolaryngology

## 2017-09-11 DIAGNOSIS — H9011 Conductive hearing loss, unilateral, right ear, with unrestricted hearing on the contralateral side: Secondary | ICD-10-CM | POA: Diagnosis not present

## 2017-09-12 ENCOUNTER — Other Ambulatory Visit (HOSPITAL_COMMUNITY): Payer: Medicare Other

## 2017-09-12 DIAGNOSIS — C44311 Basal cell carcinoma of skin of nose: Secondary | ICD-10-CM | POA: Diagnosis not present

## 2017-09-12 DIAGNOSIS — Z85828 Personal history of other malignant neoplasm of skin: Secondary | ICD-10-CM | POA: Diagnosis not present

## 2017-09-12 IMAGING — CR DG CHEST 1V PORT
1 series · 1 of 1 positions shown · non-contrast
Comparison: Chest CT 09/09/2016

CLINICAL DATA: Weakness.

EXAM:
PORTABLE CHEST 1 VIEW

[portable]
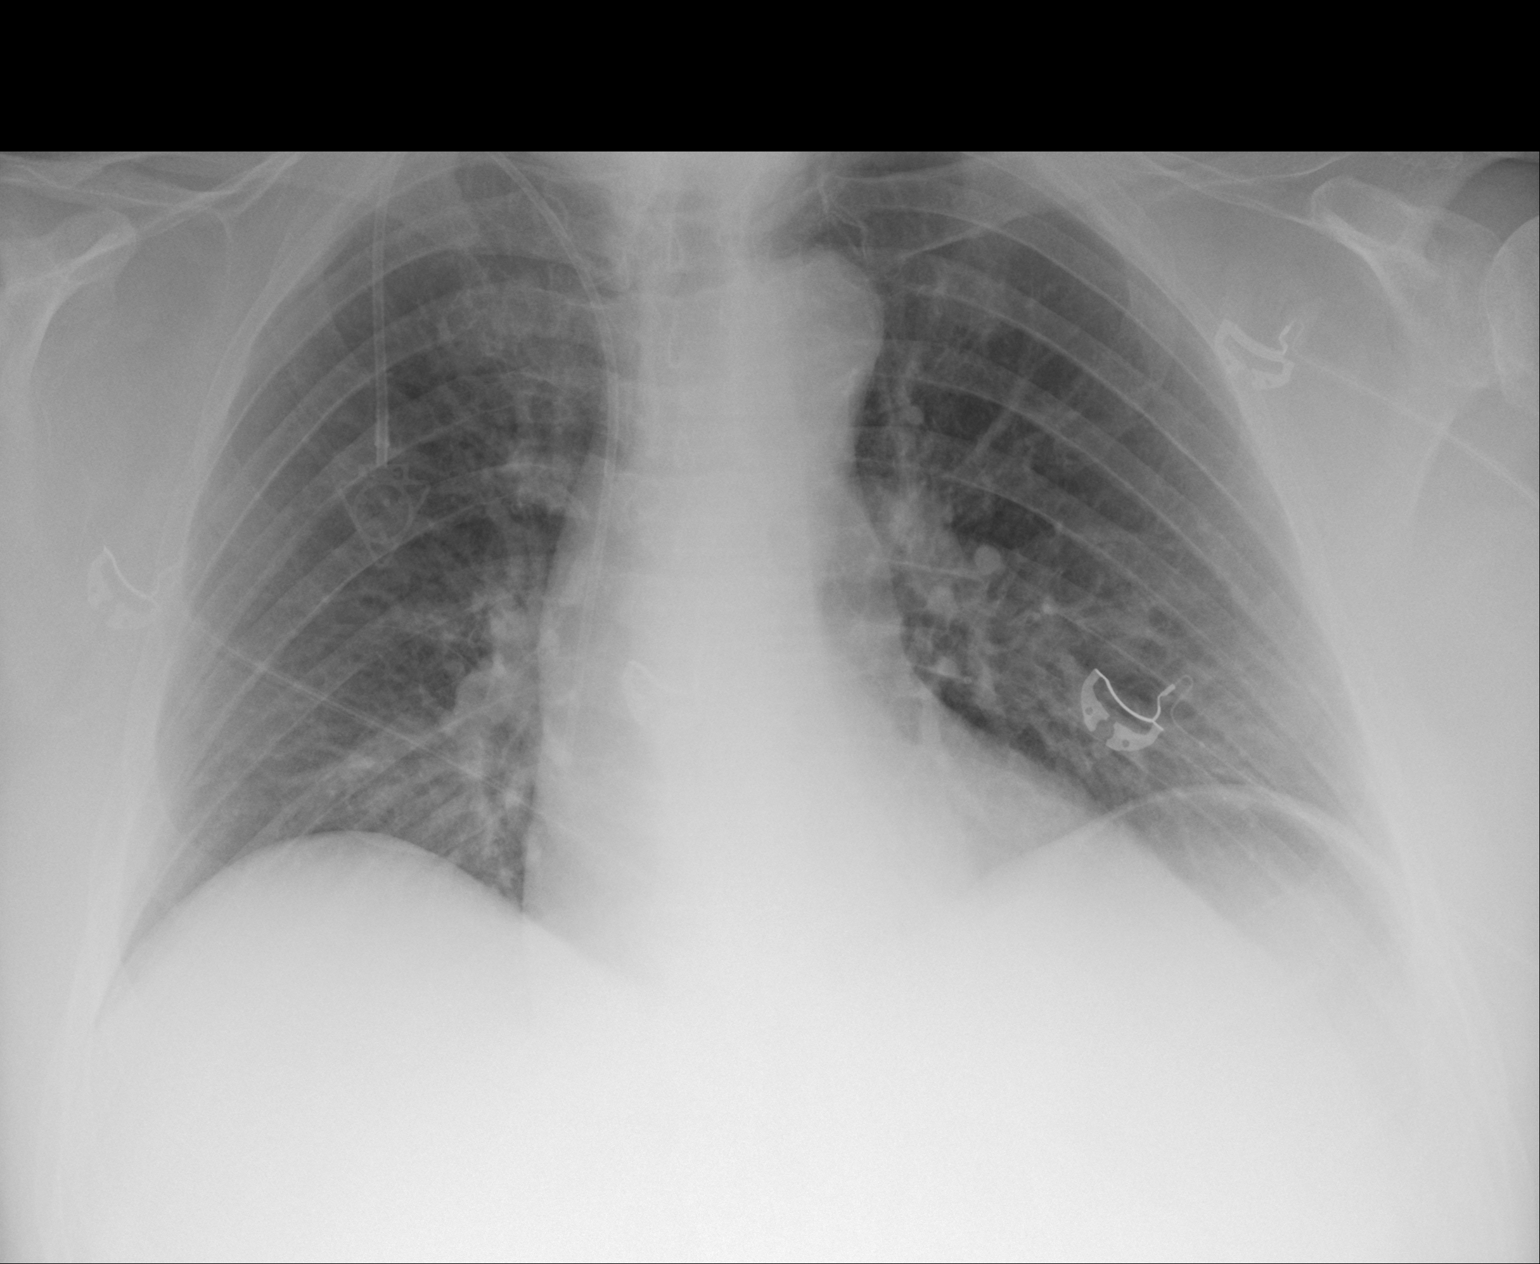

[1 of 1 positions shown; findings below may reference images not displayed]

FINDINGS: Injectable port terminates at the cavoatrial junction.

Cardiomediastinal silhouette is normal. Mediastinal contours appear
intact.

There is no evidence of focal airspace consolidation, pleural
effusion or pneumothorax. Low lung volumes.

Osseous structures are without acute abnormality. Soft tissues are
grossly normal.
IMPRESSION: No active disease.  Low lung volumes.

## 2017-09-17 DIAGNOSIS — E291 Testicular hypofunction: Secondary | ICD-10-CM | POA: Diagnosis not present

## 2017-09-19 ENCOUNTER — Ambulatory Visit (INDEPENDENT_AMBULATORY_CARE_PROVIDER_SITE_OTHER): Payer: Medicare Other | Admitting: Nurse Practitioner

## 2017-09-19 ENCOUNTER — Encounter: Payer: Self-pay | Admitting: Nurse Practitioner

## 2017-09-19 VITALS — BP 126/82 | HR 68 | Ht 71.0 in | Wt 238.8 lb

## 2017-09-19 DIAGNOSIS — I10 Essential (primary) hypertension: Secondary | ICD-10-CM

## 2017-09-19 DIAGNOSIS — E7800 Pure hypercholesterolemia, unspecified: Secondary | ICD-10-CM

## 2017-09-19 DIAGNOSIS — I259 Chronic ischemic heart disease, unspecified: Secondary | ICD-10-CM

## 2017-09-19 MED ORDER — AMLODIPINE BESYLATE 5 MG PO TABS: 5.0000 mg | ORAL_TABLET | Freq: Every day | ORAL | 3 refills | Status: DC

## 2017-09-19 NOTE — Progress Notes (Signed)
CARDIOLOGY OFFICE NOTE  Date:  09/19/2017    [name] Date of Birth: 05-18-1935 Medical Record #[id number]  PCP:  Celene Squibb, MD  Cardiologist:  Lindwood Qua     Chief Complaint  Patient presents with  . Coronary Artery Disease    6 month check - seen for Dr. Domenic Polite    History of Present Illness: [name] is a 82 y.o. male who presents today for a 6 month check. Former patient of Dr. Maren Beach. Last seen in May of 2014 by Dr. Domenic Polite. Basically follows with me.   He has known CAD with no prior history of PCI. Other issues include colon cancer, HTN, GERD, HLD, PVDand OSA. Remote stress test from 2008. Reported echocardiogram from April 2011 noted upper normal LV wall thickness with LVEF 16-01%, grade 2 diastolic dysfunction, trivial aortic regurgitation, mild biatrial enlargement.  I saw him back in October of 2017 after a 3 year absence - he was needing left hand surgery and needing clearance. He hadnot been back here "because nothing is wrong with me". He was off most of his medicines other than Inderal and his med list did not match his PCP's. BP high here and at home. No symptoms. I put him on Norvasc - I was not convinced that he would stay on long term. Last seen in June - history remained pretty hard to follow. Chronic DOE. Cardiac status seemed stable. Sugars running high.   Comes in today. Here alone. He is doing ok. Says "great". No chest pain. Still walking most days. 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 He feels like he is doing well. Has had some surgery on his skin by dermatology.   Past Medical History:  Diagnosis Date  . B12 deficiency 02/07/2014  . Colon cancer ([place])   . 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 Essential hypertension, benign   . GERD (gastroesophageal reflux disease)   . Mixed hyperlipidemia   .  Obstructive sleep apnea    does not use  . Prostate cancer ([place])   . PVD (peripheral vascular disease) ([place])   . Type 2 diabetes mellitus ([place])     Past Surgical History:  Procedure Laterality Date  . BACK SURGERY    . BALLOON DILATION N/A 08/27/2013   Procedure: BALLOON DILATION;  Surgeon: Rogene Houston, MD;  [facility];  Service: Endoscopy;  Laterality: N/A;  . COLON SURGERY    . COLONOSCOPY N/A 12/17/2013   Procedure: COLONOSCOPY;  Surgeon: Rogene Houston, MD;  [facility];  Service: Endoscopy;  Laterality: N/A;  940  . COLONOSCOPY N/A 02/28/2014   Procedure: COLONOSCOPY;  Surgeon: Rogene Houston, MD;  [facility];  Service: Endoscopy;  Laterality: N/A;  730  . COLONOSCOPY N/A 08/04/2015   Procedure: COLONOSCOPY;  Surgeon: Rogene Houston, MD;  [facility];  Service: Endoscopy;  Laterality: N/A;  200 - moved to 11/11 @ 2:10 - Ann to notify pt  . COLONOSCOPY WITH ESOPHAGOGASTRODUODENOSCOPY (EGD) N/A 08/27/2013   Procedure: COLONOSCOPY WITH ESOPHAGOGASTRODUODENOSCOPY (EGD);  Surgeon: Rogene Houston, MD;  [facility];  Service: Endoscopy;  Laterality: N/A;  730  . KNEE ARTHROSCOPY Left   . KNEE SURGERY Right    total knee  . MALONEY DILATION N/A 08/27/2013   Procedure: MALONEY DILATION;  Surgeon: Rogene Houston, MD;  [facility];  Service: Endoscopy;  Laterality: N/A;  . PORTACATH PLACEMENT Right 2014  . PROSTATECTOMY    . removal of port Right   . SAVORY DILATION N/A 08/27/2013   Procedure: SAVORY DILATION;  Surgeon: Rogene Houston, MD;  [facility];  Service: Endoscopy;  Laterality: N/A;  . SHOULDER SURGERY    . TONSILLECTOMY    . VASECTOMY       Medications: Current Meds  Medication Sig  . acetaminophen (TYLENOL) 325 MG tablet Take 650 mg by mouth every 4 (four) hours as needed for mild pain or moderate pain. Take 2 tablets 1 hour prior to Rituxan treatment.  [name] albuterol (PROAIR HFA) 108  (90 Base) MCG/ACT inhaler Inhale 2 puffs into the lungs at bedtime as needed for wheezing or shortness of breath.   [name] amLODipine (NORVASC) 5 MG tablet Take 1 tablet (5 mg total) by mouth daily.  . budesonide-formoterol (SYMBICORT) 160-4.5 MCG/ACT inhaler Inhale 2 puffs into the lungs 2 (two) times daily.  . Cyanocobalamin 1000 MCG/ML KIT Inject 1 mL as directed every 30 (thirty) days.  . diazepam (VALIUM) 5 MG tablet Take 5 mg by mouth at bedtime as needed (sleep).  . fluticasone (FLONASE) 50 MCG/ACT nasal spray Place 1 spray into both nostrils as needed for allergies or rhinitis.  [name] gabapentin (NEURONTIN) 300 MG capsule   . glipiZIDE (GLUCOTROL) 10 MG tablet Take 10 mg by mouth 2 (two) times daily before a meal.   . hydrocortisone 2.5 % cream Apply 1 application topically as needed (itching).  . insulin lispro (HUMALOG) 100 UNIT/ML cartridge Inject 1-10 Units into the skin as directed. On sliding scale when eating a big meal   . ipratropium (ATROVENT) 0.06 % nasal spray   . lidocaine-prilocaine (EMLA) cream Apply quarter size amount over port site one hour prior to appointment and cover with plastic wrap.  . meclizine (ANTIVERT) 25 MG tablet Take 25 mg by mouth 2 (two) times daily as needed for dizziness.   . pantoprazole (PROTONIX) 40 MG tablet TAKE 1 TABLET BY MOUTH 2 TIMES DAILY BEFORE A MEAL.  [name] propranolol (INDERAL) 20 MG tablet Take 1 tablet (20 mg total) by mouth 4 (four) times daily as needed.  . testosterone cypionate (DEPOTESTOSTERONE CYPIONATE) 200 MG/ML injection Inject 200 mg into the muscle every 14 (fourteen) days.   [name] tobramycin (TOBREX) 0.3 % ophthalmic solution Place 1 drop into both eyes daily as needed.   Nelva Nay SOLOSTAR 300 UNIT/ML SOPN Inject 55 Units into the skin every morning.   . [DISCONTINUED] amLODipine (NORVASC) 5 MG tablet TAKE ONE TABLET BY MOUTH ONCE DAILY.     Allergies: Allergies  Allergen Reactions  . Bee Venom Anaphylaxis  . Adhesive [Tape] Hives  .  Ciprofloxacin     Unknown   . Codeine     Unknown    . Iodine     Unknown    . Latex     Unknown    . Povidone-Iodine     Unknown    . Simvastatin     Unknown      Social History: The patient  reports that  has never smoked. he has never used smokeless tobacco. He reports that he does not drink alcohol or use drugs.   Family History: The patient's family history includes Cancer in his brother and brother; Colon cancer in his brother; Diabetes in his father.   Review of Systems: Please  see the history of present illness.   Otherwise, the review of systems is positive for none.   All other systems are reviewed and negative.   Physical Exam: VS:  BP 126/82 (BP Location: Left Arm, Patient Position: Sitting, Cuff Size: Normal)   Pulse 68   Ht '5\' 11"'  (1.803 m)   Wt 238 lb 12.8 oz (108.3 kg)   SpO2 92% Comment: at rest  BMI 33.31 kg/m  .  BMI Body mass index is 33.31 kg/m.  Wt Readings from Last 3 Encounters:  09/19/17 238 lb 12.8 oz (108.3 kg)  07/14/17 231 lb 14.4 oz (105.2 kg)  05/19/17 234 lb 6.4 oz (106.3 kg)    General: Pleasant. Elderly. Obese. Alert and in no acute distress.   HEENT: Normal.  Neck: Supple, no JVD, carotid bruits, or masses noted.  Cardiac: Regular rate and rhythm. No murmurs, rubs, or gallops. No edema.  Respiratory:  Lungs are clear to auscultation bilaterally with normal work of breathing.  GI: Soft and nontender.  MS: No deformity or atrophy. Gait and ROM intact.  Skin: Warm and dry. Color is normal.  Neuro:  Strength and sensation are intact and no gross focal deficits noted.  Psych: Alert, appropriate and with normal affect.   LABORATORY DATA:  EKG:  EKG is not ordered today.   Lab Results  Component Value Date   WBC 7.1 07/14/2017   HGB 15.3 07/14/2017   HCT 46.2 07/14/2017   PLT 102 (L) 07/14/2017   GLUCOSE 408 (H) 07/14/2017   CHOL 195 05/14/2010   TRIG 115.0 05/14/2010   HDL 55.40 05/14/2010   LDLCALC 117 (H) 05/14/2010     ALT 17 07/14/2017   AST 22 07/14/2017   NA 135 07/14/2017   K 3.8 07/14/2017   CL 100 (L) 07/14/2017   CREATININE 1.60 (H) 07/14/2017   BUN 21 (H) 07/14/2017   CO2 26 07/14/2017   TSH 2.030 10/17/2015   PSA <0.01 11/28/2016   INR 1.22 12/31/2013       BNP (last 3 results) No results for input(s): BNP in the last 8760 hours.  ProBNP (last 3 results) No results for input(s): PROBNP in the last 8760 hours.   Other Studies Reviewed Today:  CT CHEST IMPRESSION 08/2016: No lesion is seen corresponding to the area of concern over the left lower hemithorax seen on the scout tomogram of most recent study. There is currently no chest wall lesion. There is no new parenchymal lung lesion. The largest ground-glass nodular lesions seen on previous study is smaller currently. As this lesion does persist, recommendations from CT examination September 2017 remain in effect. No new nodular opacities. No adenopathy. There is atherosclerotic calcification at multiple sites including multiple foci of coronary artery calcification.   Electronically Signed By: Lowella Grip III M.D. On: 09/09/2016 09:25  Assessment/Plan:  1. CAD - managed with CV risk factor modification - he is doing well clinically with no symptoms. No changes made today.   2. HTN - His blood pressure looks good. I refilled the Norvasc.   3. HLD - not on statin - labs from April noted.   4. History of medication noncompliance - seems at this time to be taking his BP med.    Current medicines are reviewed with the patient today.  The patient does not have concerns regarding medicines other than what has been noted above.  The following changes have been made:  See above.  Labs/ tests ordered today include:  No orders of the defined types were placed in this encounter.    Disposition:   FU with me in 12 months.   Patient is agreeable to this plan and will call if any problems develop in  the interim.   SignedTruitt Merle, NP  09/19/2017 9:15 AM  [facility] [address] [place], [place]  [address] Phone: [phone] Fax: [phone]

## 2017-09-19 NOTE — Patient Instructions (Addendum)
We will be checking the following labs today - NONE   Medication Instructions:    Continue with your current medicines.   I refilled your Norvasc today    Testing/Procedures To Be Arranged:  N/A  Follow-Up:   See me in one year    Other Special Instructions:   Don't stop walking    If you need a refill on your cardiac medications before your next appointment, please call your pharmacy.   Call the [facility] office at [phone] if you have any questions, problems or concerns.

## 2017-09-20 DIAGNOSIS — Z8701 Personal history of pneumonia (recurrent): Secondary | ICD-10-CM | POA: Diagnosis not present

## 2017-09-20 DIAGNOSIS — J069 Acute upper respiratory infection, unspecified: Secondary | ICD-10-CM | POA: Diagnosis not present

## 2017-09-25 ENCOUNTER — Inpatient Hospital Stay (HOSPITAL_COMMUNITY): Payer: Medicare Other | Attending: Oncology

## 2017-09-25 ENCOUNTER — Encounter (HOSPITAL_COMMUNITY): Payer: Self-pay

## 2017-09-25 ENCOUNTER — Other Ambulatory Visit: Payer: Self-pay

## 2017-09-25 VITALS — BP 142/81 | HR 85 | Temp 98.6°F | Resp 18

## 2017-09-25 DIAGNOSIS — E538 Deficiency of other specified B group vitamins: Secondary | ICD-10-CM | POA: Insufficient documentation

## 2017-09-25 DIAGNOSIS — C8319 Mantle cell lymphoma, extranodal and solid organ sites: Secondary | ICD-10-CM | POA: Diagnosis not present

## 2017-09-25 DIAGNOSIS — C8310 Mantle cell lymphoma, unspecified site: Secondary | ICD-10-CM

## 2017-09-25 LAB — COMPREHENSIVE METABOLIC PANEL
ALBUMIN: 4 g/dL (ref 3.5–5.0)
ALT: 16 U/L — ABNORMAL LOW (ref 17–63)
ANION GAP: 13 (ref 5–15)
AST: 21 U/L (ref 15–41)
Alkaline Phosphatase: 59 U/L (ref 38–126)
BUN: 23 mg/dL — ABNORMAL HIGH (ref 6–20)
CHLORIDE: 99 mmol/L — AB (ref 101–111)
CO2: 25 mmol/L (ref 22–32)
Calcium: 8.8 mg/dL — ABNORMAL LOW (ref 8.9–10.3)
Creatinine, Ser: 1.71 mg/dL — ABNORMAL HIGH (ref 0.61–1.24)
GFR calc Af Amer: 42 mL/min — ABNORMAL LOW (ref >60)
GFR calc non Af Amer: 36 mL/min — ABNORMAL LOW (ref >60)
GLUCOSE: 358 mg/dL — AB (ref 65–99)
Potassium: 3.9 mmol/L (ref 3.5–5.1)
SODIUM: 137 mmol/L (ref 135–145)
Total Bilirubin: 0.7 mg/dL (ref 0.3–1.2)
Total Protein: 6.7 g/dL (ref 6.5–8.1)

## 2017-09-25 LAB — CBC WITH DIFFERENTIAL/PLATELET
BASOS ABS: 0 10*3/uL (ref 0.0–0.1)
Basophils Relative: 1 %
Eosinophils Absolute: 0.2 10*3/uL (ref 0.0–0.7)
Eosinophils Relative: 2 %
HEMATOCRIT: 46.1 % (ref 39.0–52.0)
Hemoglobin: 14.9 g/dL (ref 13.0–17.0)
LYMPHS PCT: 15 %
Lymphs Abs: 1.3 10*3/uL (ref 0.7–4.0)
MCH: 30 pg (ref 26.0–34.0)
MCHC: 32.3 g/dL (ref 30.0–36.0)
MCV: 92.9 fL (ref 78.0–100.0)
MONO ABS: 0.3 10*3/uL (ref 0.1–1.0)
MONOS PCT: 4 %
NEUTROS ABS: 6.6 10*3/uL (ref 1.7–7.7)
Neutrophils Relative %: 78 %
Platelets: 123 10*3/uL — ABNORMAL LOW (ref 150–400)
RBC: 4.96 MIL/uL (ref 4.22–5.81)
RDW: 15.5 % (ref 11.5–15.5)
WBC: 8.3 10*3/uL (ref 4.0–10.5)

## 2017-09-25 LAB — LACTATE DEHYDROGENASE: LDH: 131 U/L (ref 98–192)

## 2017-09-25 MED ORDER — HEPARIN SOD (PORK) LOCK FLUSH 100 UNIT/ML IV SOLN
500.0000 [IU] | Freq: Once | INTRAVENOUS | Status: AC
  Administered 2017-09-25: 500 [IU] via INTRAVENOUS

## 2017-09-25 MED ORDER — CYANOCOBALAMIN 1000 MCG/ML IJ SOLN
1000.0000 ug | Freq: Once | INTRAMUSCULAR | Status: AC
  Administered 2017-09-25: 1000 ug via INTRAMUSCULAR

## 2017-09-25 MED ORDER — HEPARIN SOD (PORK) LOCK FLUSH 100 UNIT/ML IV SOLN: 500.0000 [IU] | Freq: Once | INTRAVENOUS | Status: AC

## 2017-09-25 NOTE — Patient Instructions (Signed)
[place] at [facility] Discharge Instructions  RECOMMENDATIONS MADE BY THE CONSULTANT AND ANY TEST RESULTS WILL BE SENT TO YOUR REFERRING PHYSICIAN.  B12 injection and port flush with labs done today. Follow up as scheduled.  Thank you for choosing [place] at [facility] to provide your oncology and hematology care.  To afford each patient quality time with our provider, please arrive at least 15 minutes before your scheduled appointment time.    If you have a lab appointment with the [facility] please come in thru the  Main Entrance and check in at the main information desk  You need to re-schedule your appointment should you arrive 10 or more minutes late.  We strive to give you quality time with our providers, and arriving late affects you and other patients whose appointments are after yours.  Also, if you no show three or more times for appointments you may be dismissed from the clinic at the providers discretion.     Again, thank you for choosing [facility].  Our hope is that these requests will decrease the amount of time that you wait before being seen by our physicians.       _____________________________________________________________  Should you have questions after your visit to [facility], please contact our office at [phone] between the hours of 8:30 a.m. and 4:30 p.m.  Voicemails left after 4:30 p.m. will not be returned until the following business day.  For prescription refill requests, have your pharmacy contact our office.       Resources For Cancer Patients and their Caregivers ? American Cancer Society: Can assist with transportation, wigs, general needs, runs Look Good Feel Better.        407-492-8191 ? Cancer Care: Provides financial assistance, online support groups, medication/co-pay assistance.  1-800-813-HOPE 786-698-4615) ? [place] Assists  [place] cancer patients and their families through emotional , educational and financial support.  629-355-7313 ? [place] DSS Where to apply for food stamps, Medicaid and utility assistance. 4165094569 ? RCATS: Transportation to medical appointments. 707-305-6475 ? Social Security Administration: May apply for disability if have a Stage IV cancer. 401 603 3554 208-231-0935 ? [employer], Disability and Transit Services: Assists with nutrition, care and transit needs. [place] Support Programs: @10RELATIVEDAYS @ > Cancer Support Group  2nd Tuesday of the month 1pm-2pm, Journey Room  > Creative Journey  3rd Tuesday of the month 1130am-1pm, Journey Room  > Look Good Feel Better  1st Wednesday of the month 10am-12 noon, Journey Room (Call [place] to register [phone])

## 2017-09-25 NOTE — Progress Notes (Signed)
[name] presents today for injection per MD orders. B12 1,000 mcg administered IM in right Upper Arm. Administration without incident. Patient tolerated well.   [name] presented for Portacath access and flush. Portacath located right chest wall accessed with  H 20 needle. Good blood return present. Portacath flushed with 10ml NS and 500U/74ml Heparin and needle removed intact. Procedure without incident. Patient tolerated procedure well.  Treatment given per orders. Patient tolerated it well without problems. Vitals stable and discharged home from clinic ambulatory. Follow up as scheduled.

## 2017-09-26 DIAGNOSIS — E119 Type 2 diabetes mellitus without complications: Secondary | ICD-10-CM | POA: Diagnosis not present

## 2017-09-29 DIAGNOSIS — E291 Testicular hypofunction: Secondary | ICD-10-CM | POA: Diagnosis not present

## 2017-09-29 DIAGNOSIS — J069 Acute upper respiratory infection, unspecified: Secondary | ICD-10-CM | POA: Diagnosis not present

## 2017-10-13 DIAGNOSIS — E291 Testicular hypofunction: Secondary | ICD-10-CM | POA: Diagnosis not present

## 2017-10-17 DIAGNOSIS — J069 Acute upper respiratory infection, unspecified: Secondary | ICD-10-CM | POA: Diagnosis not present

## 2017-10-17 DIAGNOSIS — E1122 Type 2 diabetes mellitus with diabetic chronic kidney disease: Secondary | ICD-10-CM | POA: Diagnosis not present

## 2017-10-17 DIAGNOSIS — E291 Testicular hypofunction: Secondary | ICD-10-CM | POA: Diagnosis not present

## 2017-10-17 DIAGNOSIS — R42 Dizziness and giddiness: Secondary | ICD-10-CM | POA: Diagnosis not present

## 2017-10-17 DIAGNOSIS — H65199 Other acute nonsuppurative otitis media, unspecified ear: Secondary | ICD-10-CM | POA: Diagnosis not present

## 2017-10-21 DIAGNOSIS — N182 Chronic kidney disease, stage 2 (mild): Secondary | ICD-10-CM | POA: Diagnosis not present

## 2017-10-21 DIAGNOSIS — D631 Anemia in chronic kidney disease: Secondary | ICD-10-CM | POA: Diagnosis not present

## 2017-10-21 DIAGNOSIS — E1122 Type 2 diabetes mellitus with diabetic chronic kidney disease: Secondary | ICD-10-CM | POA: Diagnosis not present

## 2017-10-21 DIAGNOSIS — D696 Thrombocytopenia, unspecified: Secondary | ICD-10-CM | POA: Diagnosis not present

## 2017-10-21 DIAGNOSIS — I10 Essential (primary) hypertension: Secondary | ICD-10-CM | POA: Diagnosis not present

## 2017-10-27 DIAGNOSIS — E291 Testicular hypofunction: Secondary | ICD-10-CM | POA: Diagnosis not present

## 2017-10-30 ENCOUNTER — Inpatient Hospital Stay (HOSPITAL_COMMUNITY): Payer: Medicare Other | Attending: Oncology

## 2017-10-30 VITALS — BP 108/80 | HR 62 | Temp 98.6°F | Resp 16

## 2017-10-30 DIAGNOSIS — C8319 Mantle cell lymphoma, extranodal and solid organ sites: Secondary | ICD-10-CM | POA: Diagnosis present

## 2017-10-30 DIAGNOSIS — E538 Deficiency of other specified B group vitamins: Secondary | ICD-10-CM | POA: Insufficient documentation

## 2017-10-30 MED ORDER — CYANOCOBALAMIN 1000 MCG/ML IJ SOLN
INTRAMUSCULAR | Status: AC
  Filled 2017-10-30: qty 1

## 2017-10-30 MED ORDER — CYANOCOBALAMIN 1000 MCG/ML IJ SOLN
1000.0000 ug | Freq: Once | INTRAMUSCULAR | Status: AC
  Administered 2017-10-30: 1000 ug via INTRAMUSCULAR

## 2017-10-30 NOTE — Progress Notes (Signed)
[name] presents today for injection per MD orders. B12 1,000 mcg administered IM  in right Upper Arm. Administration without incident. Patient tolerated well.  Treatment given per orders. Patient tolerated it well without problems. Vitals stable and discharged home from clinic ambulatory. Follow up as scheduled.

## 2017-10-30 NOTE — Patient Instructions (Signed)
[facility] at [facility] Discharge Instructions  RECOMMENDATIONS MADE BY THE CONSULTANT AND ANY TEST RESULTS WILL BE SENT TO YOUR REFERRING PHYSICIAN.  B12 injection Follow up as scheduled.  Thank you for choosing [facility] at [facility] to provide your oncology and hematology care.  To afford each patient quality time with our provider, please arrive at least 15 minutes before your scheduled appointment time.    If you have a lab appointment with the Cancer Center please come in thru the  Main Entrance and check in at the main information desk  You need to re-schedule your appointment should you arrive 10 or more minutes late.  We strive to give you quality time with our providers, and arriving late affects you and other patients whose appointments are after yours.  Also, if you no show three or more times for appointments you may be dismissed from the clinic at the providers discretion.     Again, thank you for choosing [facility].  Our hope is that these requests will decrease the amount of time that you wait before being seen by our physicians.       _____________________________________________________________  Should you have questions after your visit to [facility], please contact our office at [phone] between the hours of 8:30 a.m. and 4:30 p.m.  Voicemails left after 4:30 p.m. will not be returned until the following business day.  For prescription refill requests, have your pharmacy contact our office.       Resources For Cancer Patients and their Caregivers ? American Cancer Society: Can assist with transportation, wigs, general needs, runs Look Good Feel Better.        1-888-227-6333 ? Cancer Care: Provides financial assistance, online support groups, medication/co-pay assistance.  1-800-813-HOPE (4673) ? [facility] Assists [place] cancer patients and their  families through emotional , educational and financial support.  336-427-4357 ? [place] DSS Where to apply for food stamps, Medicaid and utility assistance. 336-342-1394 ? RCATS: Transportation to medical appointments. 336-347-2287 ? Social Security Administration: May apply for disability if have a Stage IV cancer. 336-342-7796 1-800-772-1213 ? [place] Aging, Disability and Transit Services: Assists with nutrition, care and transit needs. 336-349-2343  Cancer Center Support Programs: @10RELATIVEDAYS@ > Cancer Support Group  2nd Tuesday of the month 1pm-2pm, Journey Room  > Creative Journey  3rd Tuesday of the month 1130am-1pm, Journey Room  > Look Good Feel Better  1st Wednesday of the month 10am-12 noon, Journey Room (Call American Cancer Society to register 1-800-395-5775)   

## 2017-11-04 ENCOUNTER — Telehealth (HOSPITAL_COMMUNITY): Payer: Self-pay | Admitting: Adult Health

## 2017-11-04 NOTE — Telephone Encounter (Signed)
SENT REQ TO [employer] TO REVIEW DOS 07/14/17 $ 10 BAL

## 2017-11-07 DIAGNOSIS — E1142 Type 2 diabetes mellitus with diabetic polyneuropathy: Secondary | ICD-10-CM | POA: Diagnosis not present

## 2017-11-07 DIAGNOSIS — B351 Tinea unguium: Secondary | ICD-10-CM | POA: Diagnosis not present

## 2017-11-07 DIAGNOSIS — L851 Acquired keratosis [keratoderma] palmaris et plantaris: Secondary | ICD-10-CM | POA: Diagnosis not present

## 2017-11-12 DIAGNOSIS — E1122 Type 2 diabetes mellitus with diabetic chronic kidney disease: Secondary | ICD-10-CM | POA: Diagnosis not present

## 2017-11-14 ENCOUNTER — Ambulatory Visit (HOSPITAL_COMMUNITY): Payer: Medicare Other

## 2017-11-14 ENCOUNTER — Other Ambulatory Visit (HOSPITAL_COMMUNITY): Payer: Medicare Other

## 2017-11-17 ENCOUNTER — Encounter: Payer: Self-pay | Admitting: Neurology

## 2017-11-17 ENCOUNTER — Ambulatory Visit: Payer: Medicare Other | Admitting: Neurology

## 2017-11-17 VITALS — BP 136/82 | HR 61 | Ht 71.0 in | Wt 240.8 lb

## 2017-11-17 DIAGNOSIS — G250 Essential tremor: Secondary | ICD-10-CM | POA: Diagnosis not present

## 2017-11-17 DIAGNOSIS — G3184 Mild cognitive impairment, so stated: Secondary | ICD-10-CM

## 2017-11-17 MED ORDER — PROPRANOLOL HCL 40 MG PO TABS: 40.0000 mg | ORAL_TABLET | Freq: Four times a day (QID) | ORAL | 11 refills | Status: DC | PRN

## 2017-11-17 MED ORDER — ASPIRIN EC 81 MG PO TBEC: 81.0000 mg | DELAYED_RELEASE_TABLET | Freq: Every day | ORAL | Status: DC

## 2017-11-17 NOTE — Progress Notes (Signed)
[id number]  Provider:  Dr Jaynee Eagles Referring Provider: Celene Squibb, MD Primary Care Physician:  Wende Neighbors, MD  CC: tremor  Interval history 11/17/2017: Patient is here for follow up of tremor on propranolol as well as memory changes. He did not tolerate Aricept. He does well on propranolol.  Also neuropathy is stable. He has been sick, congestion and cough and in his lungs. He is on prednisone, he is feeling a little better. Propranolol is helping. Memory is stable. He has to take 89m some days because he shales so much, no side effects if he take 4109m3-4 times a day, we can increase his dosage to 4045mnd he understands he can cut it in half if needed. His neuropathy is stable. His memory is stable. His tremor is a little worse, will increase propranolol and reviewed side effects and when he should stop.   Interval history 05/19/2017: Propranolol is still helping. He feels his memory is stable.  He takes his own medications and doesn;t miss any, he pays his bills every month and not missing pills or double paying, no difficulties driving, no accidents in the home. Not getting lost. He knows the date every day, he reads the newspaper every day, he walks, he socializes with his family sees family every day. He is still taking B12 supplement. He worries about things, he worries about mowing the yard because he runs out of time. He works on Wednesday and [place] is stable in his feet, has burning which is chronic and stable. Recommend daily ASA for stroke prevention. He hydrates well. He sometimes feels lightheaded for the last 2-3 months, takes propranolol 3x a day recommend maybe decreasing to twice a day and if symptoms of lightheadedness/dizzy get worse call us.[name] has had hypoglycemia and was seen in the ED and this could be the problem as well.   Interval history for tremor 11/04/2016: Patient is here for follow up on tremor on propranolol. He is fine  taking the propranolol 3x a day. He can take it up to 4x a day as needed. Memory is stable. Didn't tolerate the Aricept. Discussed the tremor and decided to increase as tolerated and needed. Discussed other medications other than aricept but he declines we will just   Interval history 05/06/2016:[name] a 80 9o. male here as a referral from Dr. [place] Craner tremor. PMHx CKD, Type 2 DM, HLD, HTN, insomnia, OSA, depression, polyneuropathy, cerebral ischemia, b12 deficiency, mantle cell lymphoma, anxiety, parkinson's diseas (?). He goes by the name [name] side effects with primidone and neurontin. He has been taking 37m76mopranolol three times a day. It has helped with the tremor and blood pressure has not been low and no problems taking the medication 3x a day.   MRI of the brain was unremarkable for age: IMPRESSION: Generalized cortical atrophy and chronic microvascular ischemicchanges, typical for age. No acute abnormality.   Interval history 01/22/2016: primidone and neurontin had side effects. Discussed propranolol and that this is heart medication, we need to be very mindful of his pulse and blood pressure and this can cause significant decreases in both, he should take his blood pressure and pulse daily, do not take if BP less than 11/60 or pulse < 60, stop for anything concerning whatsoever including SOB, CP. Discussed his memory, He forgetting conversation, things wife tells you, people's names, no dementia in the family. Been going on slowly progressive for at least 2 years.  He takes b12 shots. He used to have a cpap and stopped. He is tired during the day. He snores, his wife doesn't. Last sleep study was 4-5 years ago. He is Morbidly obese. Epworth sleep scale only 5/10, doesn't appear to be candidate for sleep stufy. Discussed neurocognitive testing as well.  HPI: [name] is a 83 y.o. male here as a referral from Dr. [place] Crane for tremor. PMHx CKD, Type 2 DM, HLD, HTN,  insomnia, OSA, depression, polyneuropathy, cerebral ischemia, b12 deficiency, mantle cell lymphoma, anxiety, parkinson's diseas (?). He goes by the name [name]. Tremor has been going on for over a year. Worsening. Some days he doesn't have it. Today is a good day. When he has it, it is continuous, both resting and postural. He is weak all over, he has pain all over, he went to physical therapy and improved. He has been taking carbidopa/levodopa and it has not helped the tremor, he got it from a doctor in [place]. Tremor started more than year. Getting worse. He has a difficult time writing mostly. No difficulty eating, denies excessive coffee. Nothing makes it better or unknown. Grandmother had a tremor, she had it severely and also her head shook and she shook all over. Sister has a similar tremor. He has trouble turning pages due to the tremor. Slowly progressive. No other focal neurologic deficits.   Review of outside records; HgbA1c 6.5, cmp 08/24/2015 creatinine 1.28 bun 18 gfr 54 Cbc with 3.2 wbc and anemia 11.4/36.7  Review of Systems: Patient complains of symptoms per HPI as well as the following symptoms: weight gain, easy bruising, easy beeding, SOB, cough, swelling in legs, ringing in ears, cramps, aching musckes, allergies, runnynose, weaknes, decrease energy, change in appetite, restless legs. Pertinent negatives per HPI. All others negative.   Social History   Socioeconomic History  . Marital status: Married    Spouse name: [name]  . Number of children: 2  . Years of education: 9th grade  . Highest education level: Not on file  Social Needs  . Financial resource strain: Not on file  . Food insecurity - worry: Not on file  . Food insecurity - inability: Not on file  . Transportation needs - medical: Not on file  . Transportation needs - non-medical: Not on file  Occupational History  . Occupation: Retired     Fish farm manager: RETIRED  Tobacco Use  . Smoking status: Never  Smoker  . Smokeless tobacco: Never Used  Substance and Sexual Activity  . Alcohol use: No  . Drug use: No  . Sexual activity: Yes    Birth control/protection: None  Other Topics Concern  . Not on file  Social History Narrative   Lives with wife   Caffeine use: coffee 1 cup some days of the week   Right-handed    Family History  Problem Relation Age of Onset  . Colon cancer Brother   . Cancer Brother   . Diabetes Father   . Cancer Brother     Past Medical History:  Diagnosis Date  . B12 deficiency 02/07/2014  . Colon cancer ([place])   . 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 Essential hypertension, benign   . GERD (gastroesophageal reflux disease)   . Mixed hyperlipidemia   . Obstructive sleep apnea    does not use  . Prostate cancer ([place])   . PVD (peripheral vascular disease) ([place])   .  Type 2 diabetes mellitus ([place])     Past Surgical History:  Procedure Laterality Date  . BACK SURGERY    . BALLOON DILATION N/A 08/27/2013   Procedure: BALLOON DILATION;  Surgeon: Rogene Houston, MD;  [facility];  Service: Endoscopy;  Laterality: N/A;  . COLON SURGERY    . COLONOSCOPY N/A 12/17/2013   Procedure: COLONOSCOPY;  Surgeon: Rogene Houston, MD;  [facility];  Service: Endoscopy;  Laterality: N/A;  940  . COLONOSCOPY N/A 02/28/2014   Procedure: COLONOSCOPY;  Surgeon: Rogene Houston, MD;  [facility];  Service: Endoscopy;  Laterality: N/A;  730  . COLONOSCOPY N/A 08/04/2015   Procedure: COLONOSCOPY;  Surgeon: Rogene Houston, MD;  [facility];  Service: Endoscopy;  Laterality: N/A;  200 - moved to 11/11 @ 2:10 - Ann to notify pt  . COLONOSCOPY WITH ESOPHAGOGASTRODUODENOSCOPY (EGD) N/A 08/27/2013   Procedure: COLONOSCOPY WITH ESOPHAGOGASTRODUODENOSCOPY (EGD);  Surgeon: Rogene Houston, MD;  [facility];  Service: Endoscopy;   Laterality: N/A;  730  . KNEE ARTHROSCOPY Left   . KNEE SURGERY Right    total knee  . MALONEY DILATION N/A 08/27/2013   Procedure: Venia Minks DILATION;  Surgeon: Rogene Houston, MD;  [facility];  Service: Endoscopy;  Laterality: N/A;  . PORTACATH PLACEMENT Right 2014  . PROSTATECTOMY    . removal of port Right   . SAVORY DILATION N/A 08/27/2013   Procedure: SAVORY DILATION;  Surgeon: Rogene Houston, MD;  [facility];  Service: Endoscopy;  Laterality: N/A;  . SHOULDER SURGERY    . TONSILLECTOMY    . VASECTOMY      Current Outpatient Medications  Medication Sig Dispense Refill  . acetaminophen (TYLENOL) 325 MG tablet Take 650 mg by mouth every 4 (four) hours as needed for mild pain or moderate pain. Take 2 tablets 1 hour prior to Rituxan treatment.    [name] albuterol (PROAIR HFA) 108 (90 Base) MCG/ACT inhaler Inhale 2 puffs into the lungs at bedtime as needed for wheezing or shortness of breath.     [name] amLODipine (NORVASC) 5 MG tablet Take 1 tablet (5 mg total) by mouth daily. 90 tablet 3  . atorvastatin (LIPITOR) 20 MG tablet Take 20 mg by mouth daily.    . budesonide-formoterol (SYMBICORT) 160-4.5 MCG/ACT inhaler Inhale 2 puffs into the lungs 2 (two) times daily.    . Cyanocobalamin 1000 MCG/ML KIT Inject 1 mL as directed every 30 (thirty) days.    . diazepam (VALIUM) 5 MG tablet Take 5 mg by mouth at bedtime as needed (sleep).    . fluticasone (FLONASE) 50 MCG/ACT nasal spray Place 1 spray into both nostrils as needed for allergies or rhinitis.    [name] gabapentin (NEURONTIN) 300 MG capsule Take 300 mg by mouth daily as needed.     [name] glipiZIDE (GLUCOTROL) 10 MG tablet Take 10 mg by mouth 2 (two) times daily before a meal.     . hydrocortisone 2.5 % cream Apply 1 application topically as needed (itching).    . insulin lispro (HUMALOG) 100 UNIT/ML cartridge Inject 1-10 Units into the skin as directed. On sliding scale when eating a big meal     . ipratropium (ATROVENT) 0.06  % nasal spray Place 2 sprays into both nostrils 2 (two) times daily.     [name] lidocaine-prilocaine (EMLA) cream Apply quarter size amount over port site one hour prior to appointment and  cover with plastic wrap. 30 g 2  . meclizine (ANTIVERT) 25 MG tablet Take 25 mg by mouth 2 (two) times daily as needed for dizziness.     . ondansetron (ZOFRAN) 4 MG tablet Take by mouth as needed.     . pantoprazole (PROTONIX) 40 MG tablet TAKE 1 TABLET BY MOUTH 2 TIMES DAILY BEFORE A MEAL. 60 tablet 5  . propranolol (INDERAL) 20 MG tablet Take 1 tablet (20 mg total) by mouth 4 (four) times daily as needed. 120 tablet 12  . testosterone cypionate (DEPOTESTOSTERONE CYPIONATE) 200 MG/ML injection Inject 200 mg into the muscle every 14 (fourteen) days.     [name] tobramycin (TOBREX) 0.3 % ophthalmic solution Place 1 drop into both eyes daily as needed.     Nelva Nay SOLOSTAR 300 UNIT/ML SOPN Inject 55 Units into the skin every morning.     [name] aspirin EC 81 MG tablet Take 1 tablet (81 mg total) by mouth daily.     No current facility-administered medications for this visit.    Facility-Administered Medications Ordered in Other Visits  Medication Dose Route Frequency Provider Last Rate Last Dose  . heparin lock flush 100 unit/mL  500 Units Intravenous Once Kefalas, Thomas S, PA-C      . sodium chloride flush (NS) 0.9 % injection 20 mL  20 mL Intravenous PRN Baird Cancer, PA-C        Allergies as of 11/17/2017 - Review Complete 11/17/2017  Allergen Reaction Noted  . Bee venom Anaphylaxis   . Adhesive [tape] Hives   . Ciprofloxacin    . Codeine    . Iodine    . Latex    . Povidone-iodine  10/15/2010  . Simvastatin      Vitals: BP 136/82 (BP Location: Right Arm, Patient Position: Sitting)   Pulse 61   Ht 5' 11" (1.803 m)   Wt 240 lb 12.8 oz (109.2 kg)   BMI 33.58 kg/m  Last Weight:  Wt Readings from Last 1 Encounters:  11/17/17 240 lb 12.8 oz (109.2 kg)   Last Height:   Ht Readings from Last 1  Encounters:  11/17/17 5' 11" (1.803 m)   MMSE - Mini Mental State Exam 05/19/2017 11/04/2016  Orientation to time 5 5  Orientation to Place 5 5  Registration 3 3  Attention/ Calculation 0 1  Recall 3 1  Language- name 2 objects 2 2  Language- repeat 1 1  Language- follow 3 step command 3 3  Language- read & follow direction 1 1  Write a sentence 0 1  Copy design 1 1  Total score 24 24    Cranial Nerves:  The pupils are equal, round, and reactive to light. Visual fields are full to finger confrontation. Extraocular movements are intact. Trigeminal sensation is intact and the muscles of mastication are normal. The face is symmetric. The palate elevates in the midline. Hearing intact. Voice is normal. Shoulder shrug is normal. The tongue has normal motion without fasciculations.   Coordination:  Normal finger to nose and heel to shin.   Gait:  Mildly wide based  Motor Observation:  No asymmetry, no atrophy, High frequency low amplitude tremor with postural with action components stable today Tone:  Normal muscle tone. No cogwheeling. Not increased.   Posture:  Posture is normal. normal erect   Strength:  Strength is V/V in the upper and lower limbs.    Sensation: dec pin prick, temp to the knees. Absent vibration at the toes.  Reflex Exam:  DTR's:  Deep tendon reflexes in the upper and lower extremities are hypo bilaterally.  Toes:  The toes are downgoing bilaterally.  Clonus:  Clonus is absent.    Assessment/Plan: 83  year old patient with what appears to be a familial essential tremor, memory loss (mild cogniitve impairment). Grandmother and sister have tremor. He has a low amplitude, high frequency tremor which is treated with propranolol. I don't see any signs of parkinsonism, he does not shuffle, tone is normal, no resting tremor, not bradykinetic. I think this is an essential tremor. He was given sinemet from another  doctor which didn't help because I don't think he has PD. Had side effects to primidone and neurontin. On propranolol and discussed risks in the elderly, hypotension and bradycardia, discussed it at length and he wants to try it.   - Tremor improved on propranolol but slowly worsening necessitation hight doses of propranolol, no side effects, increase to 56m 3x daily can take an additional tablet if needed during the day as tolerated.  - Neurontin:  He is also on Neurontin at night for neuropathy which is stable.   - Memory loss:Labs and MRI of the brain - unremarkable. Did not tolerate Aricept. MMSE stable 24/30 last time and declines testing today, mild cognitive impairment. He takes vitamin D daily and B12 shots, continue. Epworth sleep scale 5/10, doesn't appear to need a sleep eval for OSA. Discussed Memantine today. He declines any new medication. He feels stable.   - recommend a daily ASA for stroke prevention, also manageing stroke vascular risk factors such as cholesterol, BP, diabetes, diet, weight.  Discussed with him, stroke prevention.   MRI brain: IMPRESSION: Generalized cortical atrophy and chronic microvascular ischemic changes, typical for age. No acute abnormality.  - Discussed CREAD and trailblazers clinical trial for mild cognitive impairment again today, he is not interested  - tsh normal, he is on oral B12 due to b12 deficiency and follows with pcp.  - recommend 6 month followup patient feels stable enough agreed on one year f/u.  ASarina Ill MD  [facility] [address] [address] Phone [phone] [phone] A total of 25 minutes was spent in with this patient. Over half this time was spent on counseling patient on the tremor, MCI, neuropathy diagnosis and different therapeutic options available.

## 2017-11-19 DIAGNOSIS — R05 Cough: Secondary | ICD-10-CM | POA: Diagnosis not present

## 2017-11-19 DIAGNOSIS — R42 Dizziness and giddiness: Secondary | ICD-10-CM | POA: Diagnosis not present

## 2017-11-26 DIAGNOSIS — D631 Anemia in chronic kidney disease: Secondary | ICD-10-CM | POA: Diagnosis not present

## 2017-11-26 DIAGNOSIS — H65199 Other acute nonsuppurative otitis media, unspecified ear: Secondary | ICD-10-CM | POA: Diagnosis not present

## 2017-11-26 DIAGNOSIS — J069 Acute upper respiratory infection, unspecified: Secondary | ICD-10-CM | POA: Diagnosis not present

## 2017-11-26 DIAGNOSIS — N481 Balanitis: Secondary | ICD-10-CM | POA: Diagnosis not present

## 2017-11-28 ENCOUNTER — Inpatient Hospital Stay (HOSPITAL_COMMUNITY): Payer: Medicare Other

## 2017-11-28 ENCOUNTER — Encounter (HOSPITAL_COMMUNITY): Payer: Self-pay | Admitting: Internal Medicine

## 2017-11-28 ENCOUNTER — Inpatient Hospital Stay (HOSPITAL_COMMUNITY): Payer: Medicare Other | Attending: Oncology | Admitting: Internal Medicine

## 2017-11-28 VITALS — BP 136/72 | HR 70 | Temp 97.5°F | Resp 18 | Wt 235.4 lb

## 2017-11-28 DIAGNOSIS — C8310 Mantle cell lymphoma, unspecified site: Secondary | ICD-10-CM

## 2017-11-28 DIAGNOSIS — R0602 Shortness of breath: Secondary | ICD-10-CM | POA: Diagnosis not present

## 2017-11-28 DIAGNOSIS — Z95828 Presence of other vascular implants and grafts: Secondary | ICD-10-CM

## 2017-11-28 DIAGNOSIS — E538 Deficiency of other specified B group vitamins: Secondary | ICD-10-CM | POA: Insufficient documentation

## 2017-11-28 DIAGNOSIS — D696 Thrombocytopenia, unspecified: Secondary | ICD-10-CM

## 2017-11-28 DIAGNOSIS — C8319 Mantle cell lymphoma, extranodal and solid organ sites: Secondary | ICD-10-CM | POA: Diagnosis not present

## 2017-11-28 MED ORDER — HEPARIN SOD (PORK) LOCK FLUSH 100 UNIT/ML IV SOLN
500.0000 [IU] | Freq: Once | INTRAVENOUS | Status: AC
  Administered 2017-11-28: 500 [IU] via INTRAVENOUS
  Filled 2017-11-28: qty 5

## 2017-11-28 MED ORDER — SODIUM CHLORIDE 0.9% FLUSH
10.0000 mL | INTRAVENOUS | Status: DC | PRN
  Administered 2017-11-28: 10 mL via INTRAVENOUS
  Filled 2017-11-28: qty 10

## 2017-11-28 MED ORDER — CYANOCOBALAMIN 1000 MCG/ML IJ SOLN
1000.0000 ug | Freq: Once | INTRAMUSCULAR | Status: AC
  Administered 2017-11-28: 1000 ug via INTRAMUSCULAR
  Filled 2017-11-28: qty 1

## 2017-11-28 NOTE — Progress Notes (Signed)
[name] presents today for injection per the provider's orders.  B12 administration without incident; see MAR for injection details.  Patient tolerated procedure well and without incident.  No questions or complaints noted at this time.  [name] presented for Portacath access and flush.   Portacath located right chest wall accessed with  H 20 needle.  Good blood return present. Portacath flushed with 48ml NS and 500U/32ml Heparin and needle removed intact.  Procedure tolerated well and without incident.  Discharged ambulatory.

## 2017-11-28 NOTE — Patient Instructions (Addendum)
[place] at [facility]  Discharge Instructions:  All your labs look good today.  We would like to do another CT scan since it's been 2 years since your last scan.  Continue check ups.  The new doctor will see you with your next appointment and schedule a ct scan and lab work.   _______________________________________________________________  Thank you for choosing [place] at [facility] to provide your oncology and hematology care.  To afford each patient quality time with our providers, please arrive at least 15 minutes before your scheduled appointment.  You need to re-schedule your appointment if you arrive 10 or more minutes late.  We strive to give you quality time with our providers, and arriving late affects you and other patients whose appointments are after yours.  Also, if you no show three or more times for appointments you may be dismissed from the clinic.  Again, thank you for choosing [place] at [facility] hope is that these requests will allow you access to exceptional care and in a timely manner. _______________________________________________________________  If you have questions after your visit, please contact our office at [phone] between the hours of 8:30 a.m. and 5:00 p.m. Voicemails left after 4:30 p.m. will not be returned until the following business day. _______________________________________________________________  For prescription refill requests, have your pharmacy contact our office. _______________________________________________________________  Recommendations made by the consultant and any test results will be sent to your referring physician. _______________________________________________________________

## 2017-11-28 NOTE — Progress Notes (Signed)
Diagnosis No diagnosis found.  Staging Cancer Staging No matching staging information was found for the patient.  THERAPY PLAN:  Surveillance for Mantle Cell Lymphoma following complete response to therapy per NCCN guidelines (4.2017):  A. Clinical follow-up every 3-6 months for 5 years and then annually, or as clinically indicated.  Assessment and Plan:  1.  Mantle Cell lymphoma of the large intestine diagnosed 08/2013, S/P BR x 6 cycles from 09/14/2013- 02/07/2014, followed by maintenance Rituxan 05/10/2014- 04/01/2016. Pt currently on observation and was previously followed by Dr. Talbert Cage.    He was given option of repeat imaging with a CT chest abdomen pelvis for follow-up especially based on some of his respiratory symptoms.  He desires to wait on additional imaging.He will RTC in 3 months for follow-up and repeat labs.  2.  SOB.  Pulse ox is 93% on room air.  I discussed with him he had a scan done December 2017 that showed no lesion but he had evidence of ground-glass nodular lesions seen that were reportedly smaller.  He has some coarse breath sounds and wheezing on exam.  He was given option of repeat imaging with CT chest abdomen and pelvis for follow-up especially based on his history.  He desires to wait for further evaluation and will return to clinic in 3 months for follow-up with Dr.Katragadda for further discussion.    3.  Thrombocytopenia.  Platelet count is improved at 123,000.  He will return to clinic in 3 months for repeat labs.  Interval history: 83 y.o. Male with  Mantle Cell lymphoma of the large intestine diagnosed 08/2013, S/P BR x 6 cycles from 09/14/2013- 02/07/2014, followed by maintenance Rituxan 05/10/2014- 04/01/2016. Colonoscopy on 02/28/2014 by Dr. Laural Golden demonstrated NED.  Current Status: Patient is seen today for follow-up.  He reports he is doing well but reports occasional shortness of breath.  He also has some fatigue.    Mantle cell lymphoma ([place])   08/27/2013  Initial Diagnosis    Colon, biopsy, random - MANTLE CELL LYMPHOMA      09/06/2013 Imaging    CT CAP- No significant lymphadenopathy identified within the chest, abdomen or pelvis.      09/14/2013 - 02/07/2014 Chemotherapy    Bendamustine/Rituxan x 6 cycles with Neulasta support      12/17/2013 Procedure    Colonoscopy with biopsy by Dr. Laural Golden.      12/17/2013 Pathology Results    Colon, biopsy, ascending and transverse - BENIGN APPEARING COLONIC MUCOSA WITH SCATTERED ATYPICAL LYMPHOCYTES.Overall, these findings are suspicious for minimal residual mantle cell lymphoma.      02/28/2014 Procedure    Colonoscopy with biopsy by Dr. Laural Golden      02/28/2014 Pathology Results    Colon, biopsy, proximal and distal - BENIGN COLONIC MUCOSA. - NO SIGNIFICANT INFLAMMATION OR OTHER ABNORMALITIES IDENTIFIED. - NO EVIDENCE OF LYMPHOMA, ADENOMATOUS CHANGES OR MALIGNANCY.      02/28/2014 Remission    No evidence of malignancy on colonosocpy and pathology from biopsy.      05/10/2014 - 04/01/2016 Chemotherapy    Maintenance Rituxan 500 mg/m x 2 years.      05/11/2015 Imaging    No definite findings to suggest metastatic disease in chest, abdomen or pelvis, new ground glass atten in lungs B, nonspecific, repeat 6 to 12 mo      06/30/2015 - 07/02/2015 Hospital Admission    AKI      08/04/2015 Procedure    Colonoscopy with biopsy by Dr. Laural Golden.  08/04/2015 Pathology Results    Colon, biopsy, right and left. - BENIGN COLORECTAL MUCOSA. - ASSOCIATED BENIGN LYMPHOID AGGREGATES. - NO EVIDENCE OF SIGNIFICANT INFLAMMATION, DYSPLASIA OR MALIGNANCY.      05/10/2016 Imaging    CT CAP- 1. No definite findings to suggest metastatic disease in the chest, abdomen or pelvis. There are several new patchy areas of ground-glass attenuation in the lungs bilaterally which are highly nonspecific. The possibility of developing interstitial lung disease is not excluded, and repeat high-resolution chest CT is  suggested in 6-12 months to assess for temporal changes in the appearance of the lung parenchyma.       06/17/2016 Imaging    CT chest high resolution- 1. No convincing findings of interstitial lung disease. Minimal subpleural reticulation and ground-glass attenuation in the dependent lower lobes is not significantly changed since 05/11/2015, and appears slightly less prominent on the inspiratory sequence, suggesting a combination of mild hypoventilatory change and minimal nonspecific scarring. No traction bronchiectasis or frank honeycombing. 2. Two tiny solid pulmonary nodules in the right middle lobe and left lower lobe are stable and probably benign. 3. Left lower lobe ground-glass 11 mm nodule is stable. Given persistence, repeat CT is recommended every 2 years until 5 years of stability has been established. This recommendation follows the consensus statement: Guidelines for Management of Incidental Pulmonary Nodules Detected on CT Images: From the Fleischner Society 2017; Radiology 2017; 284:228-243. 4. Additional findings include aortic atherosclerosis and coronary atherosclerosis.      08/19/2016 Imaging    CT abd/pelvis- 1. Tiny nonobstructive stones in the right kidney. No cause for left flank pain identified. 2. Diverticulosis without focal diverticulitis. 3. Atherosclerosis. 4. Rounded density over the left lower chest on the scout view only, not seen on the June 17, 2016 CT is likely something on the patient's such as an EKG lead. Recommend clinical correlation. 5. Cholelithiasis.       Problem List Patient Active Problem List   Diagnosis Date Noted  . Multiple pulmonary nodules [R91.8] 08/01/2016  . Upper airway cough syndrome [R05] 07/30/2016  . Chronic asthma vs UACS  [J45.30] 07/11/2016  . Mild cognitive impairment [G31.84] 01/22/2016  . Essential tremor [G25.0] 10/17/2015  . CKD (chronic kidney disease) stage 3, GFR 30-59 ml/min (HCC) [N18.3]  07/02/2015  . AKI (acute kidney injury) ([place]) [N17.9] 06/30/2015  . Acute kidney injury ([place]) [N17.9] 06/30/2015  . B12 deficiency [E53.8] 02/07/2014  . Diverticulosis of colon with hemorrhage [K57.31] 01/01/2014  . Thrombocytopenia, unspecified ([place]) [D69.6] 01/01/2014  . Acute blood loss anemia [D62] 01/01/2014  . Leukopenia [D72.819] 12/31/2013  . Lower GI bleed [K92.2] 12/30/2013  . Diverticulitis [K57.92] 12/30/2013  . Abdominal pain, left lower quadrant [R10.32] 11/16/2013  . Abdominal pain, unspecified site [R10.9] 11/16/2013  . Mantle cell lymphoma ([place]) [C83.10] 09/07/2013  . Prostate cancer ([place]) [C61] 09/07/2013  . Proctitis, radiation [K62.7] 09/07/2013  . H/O ulcerative colitis [Z87.19] 04/12/2013  . OSA (obstructive sleep apnea) [G47.33] 08/15/2012  . VASOMOTOR RHINITIS [J30.9] 01/23/2010  . GERD [K21.9] 01/23/2010  . SOB (shortness of breath) [R06.02] 01/01/2010  . Mixed hyperlipidemia [E78.2] 11/23/2009  . HYPERTENSION, BENIGN [I10] 11/23/2009  . Coronary atherosclerosis of native coronary artery [I25.10] 02/18/2009    Past Medical History Past Medical History:  Diagnosis Date  . B12 deficiency 02/07/2014  . Colon cancer ([place])   . 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 Essential hypertension,  benign   . GERD (gastroesophageal reflux disease)   . Mixed hyperlipidemia   . Obstructive sleep apnea    does not use  . Prostate cancer ([place])   . PVD (peripheral vascular disease) ([place])   . Type 2 diabetes mellitus [facility])     Past Surgical History Past Surgical History:  Procedure Laterality Date  . BACK SURGERY    . BALLOON DILATION N/A 08/27/2013   Procedure: BALLOON DILATION;  Surgeon: Rogene Houston, MD;  [facility];  Service: Endoscopy;  Laterality: N/A;  . COLON SURGERY    . COLONOSCOPY N/A 12/17/2013   Procedure: COLONOSCOPY;  Surgeon: Rogene Houston,  MD;  [facility];  Service: Endoscopy;  Laterality: N/A;  940  . COLONOSCOPY N/A 02/28/2014   Procedure: COLONOSCOPY;  Surgeon: Rogene Houston, MD;  [facility];  Service: Endoscopy;  Laterality: N/A;  730  . COLONOSCOPY N/A 08/04/2015   Procedure: COLONOSCOPY;  Surgeon: Rogene Houston, MD;  [facility];  Service: Endoscopy;  Laterality: N/A;  200 - moved to 11/11 @ 2:10 - Ann to notify pt  . COLONOSCOPY WITH ESOPHAGOGASTRODUODENOSCOPY (EGD) N/A 08/27/2013   Procedure: COLONOSCOPY WITH ESOPHAGOGASTRODUODENOSCOPY (EGD);  Surgeon: Rogene Houston, MD;  [facility];  Service: Endoscopy;  Laterality: N/A;  730  . KNEE ARTHROSCOPY Left   . KNEE SURGERY Right    total knee  . MALONEY DILATION N/A 08/27/2013   Procedure: Venia Minks DILATION;  Surgeon: Rogene Houston, MD;  [facility];  Service: Endoscopy;  Laterality: N/A;  . PORTACATH PLACEMENT Right 2014  . PROSTATECTOMY    . removal of port Right   . SAVORY DILATION N/A 08/27/2013   Procedure: SAVORY DILATION;  Surgeon: Rogene Houston, MD;  [facility];  Service: Endoscopy;  Laterality: N/A;  . SHOULDER SURGERY    . TONSILLECTOMY    . VASECTOMY      Family History Family History  Problem Relation Age of Onset  . Colon cancer Brother   . Cancer Brother   . Diabetes Father   . Cancer Brother      Social History  reports that  has never smoked. he has never used smokeless tobacco. He reports that he does not drink alcohol or use drugs.  Medications  Current Outpatient Medications:  .  acetaminophen (TYLENOL) 325 MG tablet, Take 650 mg by mouth every 4 (four) hours as needed for mild pain or moderate pain. Take 2 tablets 1 hour prior to Rituxan treatment., Disp: , Rfl:  .  albuterol (PROAIR HFA) 108 (90 Base) MCG/ACT inhaler, Inhale 2 puffs into the lungs at bedtime as needed for wheezing or shortness of breath. , Disp: , Rfl:  .  amLODipine (NORVASC) 5 MG tablet, Take  1 tablet (5 mg total) by mouth daily., Disp: 90 tablet, Rfl: 3 .  aspirin EC 81 MG tablet, Take 1 tablet (81 mg total) by mouth daily., Disp: , Rfl:  .  atorvastatin (LIPITOR) 20 MG tablet, Take 20 mg by mouth daily., Disp: , Rfl:  .  budesonide-formoterol (SYMBICORT) 160-4.5 MCG/ACT inhaler, Inhale 2 puffs into the lungs 2 (two) times daily., Disp: , Rfl:  .  Cyanocobalamin 1000 MCG/ML KIT, Inject 1 mL as directed every 30 (thirty) days., Disp: , Rfl:  .  diazepam (VALIUM) 5 MG tablet, Take 5 mg by mouth at bedtime as needed (sleep)., Disp: , Rfl:  .  fluticasone (FLONASE) 50  MCG/ACT nasal spray, Place 1 spray into both nostrils as needed for allergies or rhinitis., Disp: , Rfl:  .  gabapentin (NEURONTIN) 300 MG capsule, Take 300 mg by mouth daily as needed. , Disp: , Rfl:  .  glipiZIDE (GLUCOTROL) 10 MG tablet, Take 10 mg by mouth 2 (two) times daily before a meal. , Disp: , Rfl:  .  hydrocortisone 2.5 % cream, Apply 1 application topically as needed (itching)., Disp: , Rfl:  .  insulin lispro (HUMALOG) 100 UNIT/ML cartridge, Inject 1-10 Units into the skin as directed. On sliding scale when eating a big meal , Disp: , Rfl:  .  ipratropium (ATROVENT) 0.06 % nasal spray, Place 2 sprays into both nostrils 2 (two) times daily. , Disp: , Rfl:  .  lidocaine-prilocaine (EMLA) cream, Apply quarter size amount over port site one hour prior to appointment and cover with plastic wrap., Disp: 30 g, Rfl: 2 .  meclizine (ANTIVERT) 25 MG tablet, Take 25 mg by mouth 2 (two) times daily as needed for dizziness. , Disp: , Rfl:  .  ondansetron (ZOFRAN) 4 MG tablet, Take by mouth as needed. , Disp: , Rfl:  .  pantoprazole (PROTONIX) 40 MG tablet, TAKE 1 TABLET BY MOUTH 2 TIMES DAILY BEFORE A MEAL., Disp: 60 tablet, Rfl: 5 .  propranolol (INDERAL) 40 MG tablet, Take 1 tablet (40 mg total) by mouth 4 (four) times daily as needed., Disp: 120 tablet, Rfl: 11 .  testosterone cypionate (DEPOTESTOSTERONE CYPIONATE) 200  MG/ML injection, Inject 200 mg into the muscle every 14 (fourteen) days. , Disp: , Rfl:  .  tobramycin (TOBREX) 0.3 % ophthalmic solution, Place 1 drop into both eyes daily as needed. , Disp: , Rfl:  .  TOUJEO SOLOSTAR 300 UNIT/ML SOPN, Inject 55 Units into the skin every morning. , Disp: , Rfl:  No current facility-administered medications for this visit.   Facility-Administered Medications Ordered in Other Visits:  .  heparin lock flush 100 unit/mL, 500 Units, Intravenous, Once, Kefalas, [place] S, PA-C .  sodium chloride flush (NS) 0.9 % injection 10 mL, 10 mL, Intravenous, PRN, Holley Bouche, NP, 10 mL at 11/28/17 1145 .  sodium chloride flush (NS) 0.9 % injection 20 mL, 20 mL, Intravenous, PRN, Kefalas, Manon Hilding, PA-C  Allergies Bee venom; Adhesive [tape]; Ciprofloxacin; Codeine; Iodine; Latex; Povidone-iodine; and Simvastatin  Review of Systems Review of Systems - Oncology ROS as per HPI otherwise 12 point ROS is negative other than SOB.     Physical Exam  Vitals Wt Readings from Last 3 Encounters:  11/28/17 235 lb 6.4 oz (106.8 kg)  11/17/17 240 lb 12.8 oz (109.2 kg)  09/19/17 238 lb 12.8 oz (108.3 kg)   Temp Readings from Last 3 Encounters:  11/28/17 (!) 97.5 F (36.4 C) (Oral)  10/30/17 98.6 F (37 C) (Oral)  09/25/17 98.6 F (37 C) (Oral)   BP Readings from Last 3 Encounters:  11/28/17 136/72  11/17/17 136/82  10/30/17 108/80   Pulse Readings from Last 3 Encounters:  11/28/17 70  11/17/17 61  10/30/17 62   Constitutional: Well-developed, well-nourished, and in no distress.   HENT: Head: Normocephalic and atraumatic.  Mouth/Throat: No oropharyngeal exudate. Mucosa moist. Eyes: Pupils are equal, round, and reactive to light. Conjunctivae are normal. No scleral icterus.  Neck: Normal range of motion. Neck supple. No JVD present.  Cardiovascular: Normal rate, regular rhythm and normal heart sounds.  Exam reveals no gallop and no friction rub.   No murmur  heard. Pulmonary/Chest: Effort normal.  Coarse BS with few wheezes noted.   Abdominal: Soft. Bowel sounds are normal. No distension. There is no tenderness. There is no guarding.  Musculoskeletal: No edema or tenderness.  Lymphadenopathy: No cervical, axillary or supraclavicular adenopathy.  Neurological: Alert and oriented to person, place, and time. No cranial nerve deficit.  Skin: Skin is warm and dry. No rash noted. No erythema. No pallor.  Psychiatric: Affect and judgment normal.   Labs No visits with results within 3 Day(s) from this visit.  Latest known visit with results is:  Infusion on 09/25/2017  Component Date Value Ref Range Status  . LDH 09/25/2017 131  98 - 192 U/L Final  . Sodium 09/25/2017 137  135 - 145 mmol/L Final  . Potassium 09/25/2017 3.9  3.5 - 5.1 mmol/L Final  . Chloride 09/25/2017 99* 101 - 111 mmol/L Final  . CO2 09/25/2017 25  22 - 32 mmol/L Final  . Glucose, Bld 09/25/2017 358* 65 - 99 mg/dL Final  . BUN 09/25/2017 23* 6 - 20 mg/dL Final  . Creatinine, Ser 09/25/2017 1.71* 0.61 - 1.24 mg/dL Final  . Calcium 09/25/2017 8.8* 8.9 - 10.3 mg/dL Final  . Total Protein 09/25/2017 6.7  6.5 - 8.1 g/dL Final  . Albumin 09/25/2017 4.0  3.5 - 5.0 g/dL Final  . AST 09/25/2017 21  15 - 41 U/L Final  . ALT 09/25/2017 16* 17 - 63 U/L Final  . Alkaline Phosphatase 09/25/2017 59  38 - 126 U/L Final  . Total Bilirubin 09/25/2017 0.7  0.3 - 1.2 mg/dL Final  . GFR calc non Af Amer 09/25/2017 36* >60 mL/min Final  . GFR calc Af Amer 09/25/2017 42* >60 mL/min Final   Comment: (NOTE) The eGFR has been calculated using the CKD EPI equation. This calculation has not been validated in all clinical situations. eGFR's persistently <60 mL/min signify possible Chronic Kidney Disease.   . Anion gap 09/25/2017 13  5 - 15 Final  . WBC 09/25/2017 8.3  4.0 - 10.5 K/uL Final  . RBC 09/25/2017 4.96  4.22 - 5.81 MIL/uL Final  . Hemoglobin 09/25/2017 14.9  13.0 - 17.0 g/dL Final  . HCT  09/25/2017 46.1  39.0 - 52.0 % Final  . MCV 09/25/2017 92.9  78.0 - 100.0 fL Final  . MCH 09/25/2017 30.0  26.0 - 34.0 pg Final  . MCHC 09/25/2017 32.3  30.0 - 36.0 g/dL Final  . RDW 09/25/2017 15.5  11.5 - 15.5 % Final  . Platelets 09/25/2017 123* 150 - 400 K/uL Final  . Neutrophils Relative % 09/25/2017 78  % Final  . Neutro Abs 09/25/2017 6.6  1.7 - 7.7 K/uL Final  . Lymphocytes Relative 09/25/2017 15  % Final  . Lymphs Abs 09/25/2017 1.3  0.7 - 4.0 K/uL Final  . Monocytes Relative 09/25/2017 4  % Final  . Monocytes Absolute 09/25/2017 0.3  0.1 - 1.0 K/uL Final  . Eosinophils Relative 09/25/2017 2  % Final  . Eosinophils Absolute 09/25/2017 0.2  0.0 - 0.7 K/uL Final  . Basophils Relative 09/25/2017 1  % Final  . Basophils Absolute 09/25/2017 0.0  0.0 - 0.1 K/uL Final    CT Chest done 09/09/2016:  IMPRESSION: No lesion is seen corresponding to the area of concern over the left lower hemithorax seen on the scout tomogram of most recent study. There is currently no chest wall lesion. There is no new parenchymal lung lesion. The largest ground-glass nodular lesions seen on previous  study is smaller currently. As this lesion does persist, recommendations from CT examination September 2017 remain in effect. No new nodular opacities. No adenopathy. There is atherosclerotic calcification at multiple sites including multiple foci of coronary artery calcification    Zoila Shutter MD

## 2017-12-18 DIAGNOSIS — R197 Diarrhea, unspecified: Secondary | ICD-10-CM | POA: Diagnosis not present

## 2017-12-18 DIAGNOSIS — R110 Nausea: Secondary | ICD-10-CM | POA: Diagnosis not present

## 2017-12-18 DIAGNOSIS — N481 Balanitis: Secondary | ICD-10-CM | POA: Diagnosis not present

## 2017-12-18 DIAGNOSIS — R05 Cough: Secondary | ICD-10-CM | POA: Diagnosis not present

## 2017-12-21 DIAGNOSIS — J399 Disease of upper respiratory tract, unspecified: Secondary | ICD-10-CM | POA: Diagnosis not present

## 2017-12-21 DIAGNOSIS — H65192 Other acute nonsuppurative otitis media, left ear: Secondary | ICD-10-CM | POA: Diagnosis not present

## 2017-12-21 DIAGNOSIS — R110 Nausea: Secondary | ICD-10-CM | POA: Diagnosis not present

## 2017-12-21 DIAGNOSIS — N481 Balanitis: Secondary | ICD-10-CM | POA: Diagnosis not present

## 2017-12-24 ENCOUNTER — Other Ambulatory Visit (HOSPITAL_COMMUNITY): Payer: Self-pay | Admitting: Respiratory Therapy

## 2017-12-24 DIAGNOSIS — R42 Dizziness and giddiness: Secondary | ICD-10-CM | POA: Diagnosis not present

## 2017-12-24 DIAGNOSIS — R062 Wheezing: Secondary | ICD-10-CM

## 2017-12-24 DIAGNOSIS — J060 Acute laryngopharyngitis: Secondary | ICD-10-CM | POA: Diagnosis not present

## 2017-12-24 DIAGNOSIS — R0602 Shortness of breath: Secondary | ICD-10-CM

## 2017-12-29 ENCOUNTER — Ambulatory Visit (HOSPITAL_COMMUNITY): Payer: Medicare Other

## 2018-01-03 DIAGNOSIS — H68012 Acute Eustachian salpingitis, left ear: Secondary | ICD-10-CM | POA: Diagnosis not present

## 2018-01-03 DIAGNOSIS — H60312 Diffuse otitis externa, left ear: Secondary | ICD-10-CM | POA: Diagnosis not present

## 2018-01-03 DIAGNOSIS — H9202 Otalgia, left ear: Secondary | ICD-10-CM | POA: Diagnosis not present

## 2018-01-16 DIAGNOSIS — E291 Testicular hypofunction: Secondary | ICD-10-CM | POA: Diagnosis not present

## 2018-01-16 DIAGNOSIS — H65199 Other acute nonsuppurative otitis media, unspecified ear: Secondary | ICD-10-CM | POA: Diagnosis not present

## 2018-01-16 DIAGNOSIS — R42 Dizziness and giddiness: Secondary | ICD-10-CM | POA: Diagnosis not present

## 2018-01-16 DIAGNOSIS — D631 Anemia in chronic kidney disease: Secondary | ICD-10-CM | POA: Diagnosis not present

## 2018-01-16 DIAGNOSIS — N481 Balanitis: Secondary | ICD-10-CM | POA: Diagnosis not present

## 2018-01-16 DIAGNOSIS — E1140 Type 2 diabetes mellitus with diabetic neuropathy, unspecified: Secondary | ICD-10-CM | POA: Diagnosis not present

## 2018-01-23 DIAGNOSIS — E782 Mixed hyperlipidemia: Secondary | ICD-10-CM | POA: Diagnosis not present

## 2018-01-23 DIAGNOSIS — D631 Anemia in chronic kidney disease: Secondary | ICD-10-CM | POA: Diagnosis not present

## 2018-01-23 DIAGNOSIS — E1122 Type 2 diabetes mellitus with diabetic chronic kidney disease: Secondary | ICD-10-CM | POA: Diagnosis not present

## 2018-01-23 DIAGNOSIS — N183 Chronic kidney disease, stage 3 (moderate): Secondary | ICD-10-CM | POA: Diagnosis not present

## 2018-01-23 DIAGNOSIS — E291 Testicular hypofunction: Secondary | ICD-10-CM | POA: Diagnosis not present

## 2018-01-28 ENCOUNTER — Encounter (HOSPITAL_COMMUNITY): Payer: Self-pay

## 2018-01-28 ENCOUNTER — Inpatient Hospital Stay (HOSPITAL_COMMUNITY): Payer: Medicare Other | Attending: Oncology

## 2018-01-28 VITALS — BP 145/85 | HR 76 | Temp 98.1°F | Resp 18

## 2018-01-28 DIAGNOSIS — E538 Deficiency of other specified B group vitamins: Secondary | ICD-10-CM | POA: Insufficient documentation

## 2018-01-28 DIAGNOSIS — C8319 Mantle cell lymphoma, extranodal and solid organ sites: Secondary | ICD-10-CM

## 2018-01-28 MED ORDER — SODIUM CHLORIDE 0.9% FLUSH
10.0000 mL | INTRAVENOUS | Status: DC | PRN
  Administered 2018-01-28: 10 mL via INTRAVENOUS
  Filled 2018-01-28: qty 10

## 2018-01-28 MED ORDER — CYANOCOBALAMIN 1000 MCG/ML IJ SOLN
INTRAMUSCULAR | Status: AC
  Filled 2018-01-28: qty 1

## 2018-01-28 MED ORDER — CYANOCOBALAMIN 1000 MCG/ML IJ SOLN
1000.0000 ug | Freq: Once | INTRAMUSCULAR | Status: AC
  Administered 2018-01-28: 1000 ug via INTRAMUSCULAR

## 2018-01-28 MED ORDER — HEPARIN SOD (PORK) LOCK FLUSH 100 UNIT/ML IV SOLN
500.0000 [IU] | Freq: Once | INTRAVENOUS | Status: AC
  Administered 2018-01-28: 500 [IU] via INTRAVENOUS
  Filled 2018-01-28: qty 5

## 2018-01-28 NOTE — Patient Instructions (Signed)
[place] at [facility] Discharge Instructions  Received Vit B12 injection and portacath flushed per protocol today. Follow-up as scheduled. Call clinic for any questions or concerns   Thank you for choosing [place] at [facility], [place] to provide your oncology and hematology care.  To afford each patient quality time with our provider, please arrive at least 15 minutes before your scheduled appointment time.   If you have a lab appointment with the [facility] please come in thru the  Main Entrance and check in at the main information desk  You need to re-schedule your appointment should you arrive 10 or more minutes late.  We strive to give you quality time with our providers, and arriving late affects you and other patients whose appointments are after yours.  Also, if you no show three or more times for appointments you may be dismissed from the clinic at the providers discretion.     Again, thank you for choosing [facility].  Our hope is that these requests will decrease the amount of time that you wait before being seen by our physicians.       _____________________________________________________________  Should you have questions after your visit to [facility], please contact our office at [phone] between the hours of 8:30 a.m. and 4:30 p.m.  Voicemails left after 4:30 p.m. will not be returned until the following business day.  For prescription refill requests, have your pharmacy contact our office.       Resources For Cancer Patients and their Caregivers ? American Cancer Society: Can assist with transportation, wigs, general needs, runs Look Good Feel Better.        8144868862 ? Cancer Care: Provides financial assistance, online support groups, medication/co-pay assistance.  1-800-813-HOPE 610-234-6070) ? [place] Assists [place] cancer patients and their families  through emotional , educational and financial support.  (403)279-8224 ? [place] DSS Where to apply for food stamps, Medicaid and utility assistance. 541-098-9459 ? [facility]: Transportation to medical appointments. (660)881-4984 ? Social Security Administration: May apply for disability if have a Stage IV cancer. 5311181099 651-543-4228 ? [employer], Disability and Transit Services: Assists with nutrition, care and transit needs. [place] Support Programs:   > Cancer Support Group  2nd Tuesday of the month 1pm-2pm, Journey Room   > Creative Journey  3rd Tuesday of the month 1130am-1pm, Journey Room

## 2018-01-28 NOTE — Progress Notes (Signed)
[name] tolerated Vit B12 injection and portacath flush well without complaints or incident. Port accessed with 20 gauge needle with blood return noted then flushed with 10 ml NS and 5 ml Heparin easily per protocol then de-accessed. VSS Pt discharged self ambulatory in satisfactory condition

## 2018-02-02 ENCOUNTER — Ambulatory Visit (INDEPENDENT_AMBULATORY_CARE_PROVIDER_SITE_OTHER): Payer: Medicare Other | Admitting: Otolaryngology

## 2018-02-02 DIAGNOSIS — H6983 Other specified disorders of Eustachian tube, bilateral: Secondary | ICD-10-CM

## 2018-02-02 DIAGNOSIS — H903 Sensorineural hearing loss, bilateral: Secondary | ICD-10-CM

## 2018-02-02 DIAGNOSIS — E1142 Type 2 diabetes mellitus with diabetic polyneuropathy: Secondary | ICD-10-CM | POA: Diagnosis not present

## 2018-02-02 DIAGNOSIS — H6122 Impacted cerumen, left ear: Secondary | ICD-10-CM | POA: Diagnosis not present

## 2018-02-02 DIAGNOSIS — L84 Corns and callosities: Secondary | ICD-10-CM | POA: Diagnosis not present

## 2018-02-02 DIAGNOSIS — E119 Type 2 diabetes mellitus without complications: Secondary | ICD-10-CM | POA: Diagnosis not present

## 2018-02-03 ENCOUNTER — Other Ambulatory Visit (INDEPENDENT_AMBULATORY_CARE_PROVIDER_SITE_OTHER): Payer: Self-pay | Admitting: Otolaryngology

## 2018-02-03 DIAGNOSIS — IMO0001 Reserved for inherently not codable concepts without codable children: Secondary | ICD-10-CM

## 2018-02-03 DIAGNOSIS — H9042 Sensorineural hearing loss, unilateral, left ear, with unrestricted hearing on the contralateral side: Secondary | ICD-10-CM

## 2018-02-17 ENCOUNTER — Ambulatory Visit (HOSPITAL_COMMUNITY)
Admission: RE | Admit: 2018-02-17 | Discharge: 2018-02-17 | Disposition: A | Discharge status: Home / Self Care | Payer: Medicare Other | Source: Ambulatory Visit | Attending: Otolaryngology | Admitting: Otolaryngology

## 2018-02-17 DIAGNOSIS — H9042 Sensorineural hearing loss, unilateral, left ear, with unrestricted hearing on the contralateral side: Secondary | ICD-10-CM | POA: Insufficient documentation

## 2018-02-17 DIAGNOSIS — IMO0001 Reserved for inherently not codable concepts without codable children: Secondary | ICD-10-CM

## 2018-02-17 DIAGNOSIS — H748X3 Other specified disorders of middle ear and mastoid, bilateral: Secondary | ICD-10-CM | POA: Diagnosis not present

## 2018-02-23 ENCOUNTER — Inpatient Hospital Stay (HOSPITAL_COMMUNITY): Payer: Medicare Other | Attending: Oncology

## 2018-02-23 ENCOUNTER — Encounter (HOSPITAL_COMMUNITY): Payer: Self-pay

## 2018-02-23 ENCOUNTER — Ambulatory Visit (INDEPENDENT_AMBULATORY_CARE_PROVIDER_SITE_OTHER): Payer: Medicare Other | Admitting: Otolaryngology

## 2018-02-23 DIAGNOSIS — H903 Sensorineural hearing loss, bilateral: Secondary | ICD-10-CM | POA: Diagnosis not present

## 2018-02-23 DIAGNOSIS — N189 Chronic kidney disease, unspecified: Secondary | ICD-10-CM | POA: Insufficient documentation

## 2018-02-23 DIAGNOSIS — R5382 Chronic fatigue, unspecified: Secondary | ICD-10-CM | POA: Diagnosis not present

## 2018-02-23 DIAGNOSIS — E538 Deficiency of other specified B group vitamins: Secondary | ICD-10-CM | POA: Diagnosis not present

## 2018-02-23 DIAGNOSIS — Z8546 Personal history of malignant neoplasm of prostate: Secondary | ICD-10-CM | POA: Insufficient documentation

## 2018-02-23 DIAGNOSIS — C8319 Mantle cell lymphoma, extranodal and solid organ sites: Secondary | ICD-10-CM | POA: Diagnosis not present

## 2018-02-23 DIAGNOSIS — H9202 Otalgia, left ear: Secondary | ICD-10-CM | POA: Diagnosis not present

## 2018-02-23 DIAGNOSIS — H6983 Other specified disorders of Eustachian tube, bilateral: Secondary | ICD-10-CM

## 2018-02-23 DIAGNOSIS — C8310 Mantle cell lymphoma, unspecified site: Secondary | ICD-10-CM

## 2018-02-23 LAB — COMPREHENSIVE METABOLIC PANEL
ALBUMIN: 3.9 g/dL (ref 3.5–5.0)
ALK PHOS: 53 U/L (ref 38–126)
ALT: 17 U/L (ref 17–63)
ANION GAP: 7 (ref 5–15)
AST: 19 U/L (ref 15–41)
BUN: 31 mg/dL — ABNORMAL HIGH (ref 6–20)
CALCIUM: 8.8 mg/dL — AB (ref 8.9–10.3)
CHLORIDE: 105 mmol/L (ref 101–111)
CO2: 28 mmol/L (ref 22–32)
Creatinine, Ser: 1.52 mg/dL — ABNORMAL HIGH (ref 0.61–1.24)
GFR calc Af Amer: 48 mL/min — ABNORMAL LOW (ref >60)
GFR calc non Af Amer: 42 mL/min — ABNORMAL LOW (ref >60)
GLUCOSE: 230 mg/dL — AB (ref 65–99)
POTASSIUM: 4.1 mmol/L (ref 3.5–5.1)
SODIUM: 140 mmol/L (ref 135–145)
Total Bilirubin: 1.2 mg/dL (ref 0.3–1.2)
Total Protein: 6.6 g/dL (ref 6.5–8.1)

## 2018-02-23 LAB — CBC WITH DIFFERENTIAL/PLATELET
BASOS ABS: 0.1 10*3/uL (ref 0.0–0.1)
BASOS PCT: 1 %
Eosinophils Absolute: 0.2 10*3/uL (ref 0.0–0.7)
Eosinophils Relative: 3 %
HEMATOCRIT: 39.4 % (ref 39.0–52.0)
Hemoglobin: 12.9 g/dL — ABNORMAL LOW (ref 13.0–17.0)
LYMPHS PCT: 15 %
Lymphs Abs: 0.9 10*3/uL (ref 0.7–4.0)
MCH: 30.6 pg (ref 26.0–34.0)
MCHC: 32.7 g/dL (ref 30.0–36.0)
MCV: 93.6 fL (ref 78.0–100.0)
MONO ABS: 0.3 10*3/uL (ref 0.1–1.0)
MONOS PCT: 6 %
NEUTROS ABS: 4.4 10*3/uL (ref 1.7–7.7)
NEUTROS PCT: 75 %
Platelets: 118 10*3/uL — ABNORMAL LOW (ref 150–400)
RBC: 4.21 MIL/uL — ABNORMAL LOW (ref 4.22–5.81)
RDW: 16.5 % — AB (ref 11.5–15.5)
WBC: 5.9 10*3/uL (ref 4.0–10.5)

## 2018-02-23 LAB — LACTATE DEHYDROGENASE: LDH: 131 U/L (ref 98–192)

## 2018-02-23 MED ORDER — HEPARIN SOD (PORK) LOCK FLUSH 100 UNIT/ML IV SOLN
500.0000 [IU] | Freq: Once | INTRAVENOUS | Status: AC
  Administered 2018-02-23: 500 [IU] via INTRAVENOUS

## 2018-02-23 MED ORDER — SODIUM CHLORIDE 0.9% FLUSH
10.0000 mL | Freq: Once | INTRAVENOUS | Status: AC
  Administered 2018-02-23: 10 mL via INTRAVENOUS

## 2018-02-23 MED ORDER — HEPARIN SOD (PORK) LOCK FLUSH 100 UNIT/ML IV SOLN
INTRAVENOUS | Status: AC
  Filled 2018-02-23: qty 5

## 2018-02-23 NOTE — Patient Instructions (Signed)
[place] at [facility]  Discharge Instructions:  Your port was flushed today with lab work.  _______________________________________________________________  Thank you for choosing [place] at [facility] to provide your oncology and hematology care.  To afford each patient quality time with our providers, please arrive at least 15 minutes before your scheduled appointment.  You need to re-schedule your appointment if you arrive 10 or more minutes late.  We strive to give you quality time with our providers, and arriving late affects you and other patients whose appointments are after yours.  Also, if you no show three or more times for appointments you may be dismissed from the clinic.  Again, thank you for choosing [place] at [place] hope is that these requests will allow you access to exceptional care and in a timely manner. _______________________________________________________________  If you have questions after your visit, please contact our office at [phone] between the hours of 8:30 a.m. and 5:00 p.m. Voicemails left after 4:30 p.m. will not be returned until the following business day. _______________________________________________________________  For prescription refill requests, have your pharmacy contact our office. _______________________________________________________________  Recommendations made by the consultant and any test results will be sent to your referring physician. _______________________________________________________________

## 2018-02-23 NOTE — Progress Notes (Signed)
Port flushed per protocol.  Port site clean and dry with no bruising or swelling noted at site..  Flushed easily with good blood return for lab work.  No complaints of pain with flush.  Band aid applied.  VSS with discharge and left ambulatory with no s/s of distress noted.

## 2018-02-25 ENCOUNTER — Ambulatory Visit (HOSPITAL_COMMUNITY): Payer: Medicare Other | Admitting: Internal Medicine

## 2018-02-25 ENCOUNTER — Ambulatory Visit (HOSPITAL_COMMUNITY): Payer: Medicare Other

## 2018-03-09 ENCOUNTER — Other Ambulatory Visit (INDEPENDENT_AMBULATORY_CARE_PROVIDER_SITE_OTHER): Payer: Self-pay | Admitting: Internal Medicine

## 2018-03-09 ENCOUNTER — Other Ambulatory Visit: Payer: Self-pay | Admitting: Nurse Practitioner

## 2018-03-09 DIAGNOSIS — K219 Gastro-esophageal reflux disease without esophagitis: Secondary | ICD-10-CM

## 2018-03-10 NOTE — Telephone Encounter (Signed)
Patient will need a OV in the next 2-3 months. He has not been seen since 2017.

## 2018-03-19 ENCOUNTER — Encounter (HOSPITAL_COMMUNITY): Payer: Self-pay | Admitting: Hematology

## 2018-03-19 ENCOUNTER — Inpatient Hospital Stay (HOSPITAL_BASED_OUTPATIENT_CLINIC_OR_DEPARTMENT_OTHER): Payer: Medicare Other | Admitting: Hematology

## 2018-03-19 ENCOUNTER — Inpatient Hospital Stay (HOSPITAL_COMMUNITY): Payer: Medicare Other

## 2018-03-19 VITALS — BP 117/73 | HR 57 | Temp 97.7°F | Resp 14 | Wt 231.0 lb

## 2018-03-19 DIAGNOSIS — E538 Deficiency of other specified B group vitamins: Secondary | ICD-10-CM

## 2018-03-19 DIAGNOSIS — C8310 Mantle cell lymphoma, unspecified site: Secondary | ICD-10-CM

## 2018-03-19 DIAGNOSIS — C8319 Mantle cell lymphoma, extranodal and solid organ sites: Secondary | ICD-10-CM | POA: Diagnosis not present

## 2018-03-19 DIAGNOSIS — N189 Chronic kidney disease, unspecified: Secondary | ICD-10-CM

## 2018-03-19 DIAGNOSIS — Z8546 Personal history of malignant neoplasm of prostate: Secondary | ICD-10-CM | POA: Diagnosis not present

## 2018-03-19 DIAGNOSIS — R5382 Chronic fatigue, unspecified: Secondary | ICD-10-CM

## 2018-03-19 MED ORDER — CYANOCOBALAMIN 1000 MCG/ML IJ SOLN
1000.0000 ug | Freq: Once | INTRAMUSCULAR | Status: AC
  Administered 2018-03-19: 1000 ug via INTRAMUSCULAR

## 2018-03-19 MED ORDER — CYANOCOBALAMIN 1000 MCG/ML IJ SOLN
INTRAMUSCULAR | Status: AC
  Filled 2018-03-19: qty 1

## 2018-03-19 NOTE — Assessment & Plan Note (Signed)
1.  Mantle cell lymphoma of the colon: -Diagnosed in December 2014 by colonoscopy.  Initial presentation with weight loss of 40 pounds. - Status post bendamustine and rituximab x6 from 09/14/2013 through 02/07/2014. - Maintenance rituximab from May 10 2014-04/01/2016. -Last CT scans in November 2017 with no evidence of adenopathy. -He does not have any B symptoms at this time.  His blood work including LDH was within normal limits.  He has mild thrombocytopenia which is also stable.  I have recommended a six-month follow-up visit with blood work.  We will do scans if clinical condition dictates.  2.  Prostate cancer: -Originally diagnosed around 2002 underwent prostatectomy and radiation therapy immediately.  We will check a PSA prior to next visit.  3.  CKD: -His creatinine is stable around 1.52.

## 2018-03-19 NOTE — Progress Notes (Signed)
Pt here today for office visit and B12 injection. Pt given injection in his right deltoid. Pt tolerated injection well with no complaints. Pt stable and discharged home ambulatory.

## 2018-03-19 NOTE — Progress Notes (Signed)
[facility] [address]  [address]   CLINIC:  Medical Oncology/Hematology  PCP:  Celene Squibb, MD [place] [address] [phone]   REASON FOR VISIT:  Follow-up for mantle cell lymphoma of the colon.  CURRENT THERAPY: Sedation.  BRIEF ONCOLOGIC HISTORY:    Mantle cell lymphoma ([place])   08/27/2013 Initial Diagnosis    Colon, biopsy, random - MANTLE CELL LYMPHOMA      09/06/2013 Imaging    CT CAP- No significant lymphadenopathy identified within the chest, abdomen or pelvis.      09/14/2013 - 02/07/2014 Chemotherapy    Bendamustine/Rituxan x 6 cycles with Neulasta support      12/17/2013 Procedure    Colonoscopy with biopsy by Dr. Laural Golden.      12/17/2013 Pathology Results    Colon, biopsy, ascending and transverse - BENIGN APPEARING COLONIC MUCOSA WITH SCATTERED ATYPICAL LYMPHOCYTES.Overall, these findings are suspicious for minimal residual mantle cell lymphoma.      02/28/2014 Procedure    Colonoscopy with biopsy by Dr. Laural Golden      02/28/2014 Pathology Results    Colon, biopsy, proximal and distal - BENIGN COLONIC MUCOSA. - NO SIGNIFICANT INFLAMMATION OR OTHER ABNORMALITIES IDENTIFIED. - NO EVIDENCE OF LYMPHOMA, ADENOMATOUS CHANGES OR MALIGNANCY.      02/28/2014 Remission    No evidence of malignancy on colonosocpy and pathology from biopsy.      05/10/2014 - 04/01/2016 Chemotherapy    Maintenance Rituxan 500 mg/m x 2 years.      05/11/2015 Imaging    No definite findings to suggest metastatic disease in chest, abdomen or pelvis, new ground glass atten in lungs B, nonspecific, repeat 6 to 12 mo      06/30/2015 - 07/02/2015 Hospital Admission    [facility]      08/04/2015 Procedure    Colonoscopy with biopsy by Dr. Laural Golden.      08/04/2015 Pathology Results    Colon, biopsy, right and left. - BENIGN COLORECTAL MUCOSA. - ASSOCIATED BENIGN LYMPHOID AGGREGATES. - NO EVIDENCE OF SIGNIFICANT INFLAMMATION, DYSPLASIA OR  MALIGNANCY.      05/10/2016 Imaging    CT CAP- 1. No definite findings to suggest metastatic disease in the chest, abdomen or pelvis. There are several new patchy areas of ground-glass attenuation in the lungs bilaterally which are highly nonspecific. The possibility of developing interstitial lung disease is not excluded, and repeat high-resolution chest CT is suggested in 6-12 months to assess for temporal changes in the appearance of the lung parenchyma.       06/17/2016 Imaging    CT chest high resolution- 1. No convincing findings of interstitial lung disease. Minimal subpleural reticulation and ground-glass attenuation in the dependent lower lobes is not significantly changed since 05/11/2015, and appears slightly less prominent on the inspiratory sequence, suggesting a combination of mild hypoventilatory change and minimal nonspecific scarring. No traction bronchiectasis or frank honeycombing. 2. Two tiny solid pulmonary nodules in the right middle lobe and left lower lobe are stable and probably benign. 3. Left lower lobe ground-glass 11 mm nodule is stable. Given persistence, repeat CT is recommended every 2 years until 5 years of stability has been established. This recommendation follows the consensus statement: Guidelines for Management of Incidental Pulmonary Nodules Detected on CT Images: From the Fleischner Society 2017; Radiology 2017; 284:228-243. 4. Additional findings include aortic atherosclerosis and coronary atherosclerosis.      08/19/2016 Imaging    CT abd/pelvis- 1. Tiny nonobstructive stones  in the right kidney. No cause for left flank pain identified. 2. Diverticulosis without focal diverticulitis. 3. Atherosclerosis. 4. Rounded density over the left lower chest on the scout view only, not seen on the June 17, 2016 CT is likely something on the patient's such as an EKG lead. Recommend clinical correlation. 5. Cholelithiasis.        CANCER  STAGING: Cancer Staging No matching staging information was found for the patient.   INTERVAL HISTORY:  [name] 83 y.o. male returns for follow-up of mantle cell lymphoma.  Denies any fevers, night sweats or weight loss.  Able to do all his household activities.  Complains of some fatigue which has been chronic.  Denies any new onset pains.  He had a 40 pound weight loss at presentation when he was diagnosed with lymphoma.    REVIEW OF SYSTEMS:  Review of Systems  Constitutional: Positive for fatigue.  Cardiovascular: Positive for leg swelling.  All other systems reviewed and are negative.    PAST MEDICAL/SURGICAL HISTORY:  Past Medical History:  Diagnosis Date  . B12 deficiency 02/07/2014  . Colon cancer ([place])   . 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 Essential hypertension, benign   . GERD (gastroesophageal reflux disease)   . Mixed hyperlipidemia   . Obstructive sleep apnea    does not use  . Prostate cancer ([place] [place])   . PVD (peripheral vascular disease) ([place])   . Type 2 diabetes mellitus ([place])    Past Surgical History:  Procedure Laterality Date  . BACK SURGERY    . BALLOON DILATION N/A 08/27/2013   Procedure: BALLOON DILATION;  Surgeon: Rogene Houston, MD;  [facility];  Service: Endoscopy;  Laterality: N/A;  . COLON SURGERY    . COLONOSCOPY N/A 12/17/2013   Procedure: COLONOSCOPY;  Surgeon: Rogene Houston, MD;  [facility];  Service: Endoscopy;  Laterality: N/A;  940  . COLONOSCOPY N/A 02/28/2014   Procedure: COLONOSCOPY;  Surgeon: Rogene Houston, MD;  [facility];  Service: Endoscopy;  Laterality: N/A;  730  . COLONOSCOPY N/A 08/04/2015   Procedure: COLONOSCOPY;  Surgeon: Rogene Houston, MD;  [facility];  Service: Endoscopy;  Laterality: N/A;  200 - moved to 11/11 @ 2:10 - Ann to notify pt  . COLONOSCOPY WITH  ESOPHAGOGASTRODUODENOSCOPY (EGD) N/A 08/27/2013   Procedure: COLONOSCOPY WITH ESOPHAGOGASTRODUODENOSCOPY (EGD);  Surgeon: Rogene Houston, MD;  [facility];  Service: Endoscopy;  Laterality: N/A;  730  . KNEE ARTHROSCOPY Left   . KNEE SURGERY Right    total knee  . MALONEY DILATION N/A 08/27/2013   Procedure: Venia Minks DILATION;  Surgeon: Rogene Houston, MD;  [facility];  Service: Endoscopy;  Laterality: N/A;  . PORTACATH PLACEMENT Right 2014  . PROSTATECTOMY    . removal of port Right   . SAVORY DILATION N/A 08/27/2013   Procedure: SAVORY DILATION;  Surgeon: Rogene Houston, MD;  [facility];  Service: Endoscopy;  Laterality: N/A;  . SHOULDER SURGERY    . TONSILLECTOMY    . VASECTOMY       SOCIAL HISTORY:  Social History   Socioeconomic History  . Marital status: Married    Spouse name: [name]  . Number of children: 2  . Years of education: 9th grade  . Highest education level: Not on file  Occupational  History  . Occupation: Retired     Fish farm manager: RETIRED  Social Needs  . Financial resource strain: Not on file  . Food insecurity:    Worry: Not on file    Inability: Not on file  . Transportation needs:    Medical: Not on file    Non-medical: Not on file  Tobacco Use  . Smoking status: Never Smoker  . Smokeless tobacco: Never Used  Substance and Sexual Activity  . Alcohol use: No  . Drug use: No  . Sexual activity: Yes    Birth control/protection: None  Lifestyle  . Physical activity:    Days per week: Not on file    Minutes per session: Not on file  . Stress: Not on file  Relationships  . Social connections:    Talks on phone: Not on file    Gets together: Not on file    Attends religious service: Not on file    Active member of club or organization: Not on file    Attends meetings of clubs or organizations: Not on file    Relationship status: Not on file  . Intimate partner violence:    Fear of current or ex partner: Not on  file    Emotionally abused: Not on file    Physically abused: Not on file    Forced sexual activity: Not on file  Other Topics Concern  . Not on file  Social History Narrative   Lives with wife   Caffeine use: coffee 1 cup some days of the week   Right-handed    FAMILY HISTORY:  Family History  Problem Relation Age of Onset  . Colon cancer Brother   . Cancer Brother   . Diabetes Father   . Cancer Brother     CURRENT MEDICATIONS:  Outpatient Encounter Medications as of 03/19/2018  Medication Sig  . acetaminophen (TYLENOL) 325 MG tablet Take 650 mg by mouth every 4 (four) hours as needed for mild pain or moderate pain. Take 2 tablets 1 hour prior to Rituxan treatment.  [name] albuterol (PROAIR HFA) 108 (90 Base) MCG/ACT inhaler Inhale 2 puffs into the lungs at bedtime as needed for wheezing or shortness of breath.   [name] amLODipine (NORVASC) 5 MG tablet TAKE ONE TABLET BY MOUTH ONCE DAILY.  [name] aspirin EC 81 MG tablet Take 1 tablet (81 mg total) by mouth daily.  [name] atorvastatin (LIPITOR) 20 MG tablet Take 20 mg by mouth daily.  . budesonide-formoterol (SYMBICORT) 160-4.5 MCG/ACT inhaler Inhale 2 puffs into the lungs 2 (two) times daily.  . Cyanocobalamin 1000 MCG/ML KIT Inject 1 mL as directed every 30 (thirty) days.  . diazepam (VALIUM) 5 MG tablet Take 5 mg by mouth at bedtime as needed (sleep).  . fluticasone (FLONASE) 50 MCG/ACT nasal spray Place 1 spray into both nostrils as needed for allergies or rhinitis.  [name] gabapentin (NEURONTIN) 300 MG capsule Take 300 mg by mouth daily as needed.   [name] glipiZIDE (GLUCOTROL) 10 MG tablet Take 10 mg by mouth 2 (two) times daily before a meal.   . hydrocortisone 2.5 % cream Apply 1 application topically as needed (itching).  . insulin lispro (HUMALOG) 100 UNIT/ML cartridge Inject 1-10 Units into the skin as directed. On sliding scale when eating a big meal   . ipratropium (ATROVENT) 0.06 % nasal spray Place 2 sprays into both nostrils 2 (two) times  daily.   [name] lidocaine-prilocaine (EMLA) cream Apply quarter size amount over port site one hour prior  to appointment and cover with plastic wrap.  . meclizine (ANTIVERT) 25 MG tablet Take 25 mg by mouth 2 (two) times daily as needed for dizziness.   . ondansetron (ZOFRAN) 4 MG tablet Take by mouth as needed.   . pantoprazole (PROTONIX) 40 MG tablet TAKE 1 TABLET BY MOUTH 2 TIMES DAILY BEFORE A MEAL.  [name] propranolol (INDERAL) 40 MG tablet Take 1 tablet (40 mg total) by mouth 4 (four) times daily as needed.  . testosterone cypionate (DEPOTESTOSTERONE CYPIONATE) 200 MG/ML injection Inject 200 mg into the muscle every 14 (fourteen) days.   [name] tobramycin (TOBREX) 0.3 % ophthalmic solution Place 1 drop into both eyes daily as needed.   Nelva Nay SOLOSTAR 300 UNIT/ML SOPN Inject 55 Units into the skin every morning.   . [DISCONTINUED] insulin aspart (NOVOLOG) 100 UNIT/ML injection Inject 20 Units into the skin 3 (three) times daily before meals.    . [DISCONTINUED] rosuvastatin (CRESTOR) 40 MG tablet Take 40 mg by mouth daily.     Facility-Administered Encounter Medications as of 03/19/2018  Medication  . heparin lock flush 100 unit/mL  . sodium chloride flush (NS) 0.9 % injection 20 mL    ALLERGIES:  Allergies  Allergen Reactions  . Bee Venom Anaphylaxis  . Adhesive [Tape] Hives  . Ciprofloxacin     Unknown   . Codeine     Unknown    . Iodine     Unknown    . Latex     Unknown    . Povidone-Iodine     Unknown    . Simvastatin     Unknown       PHYSICAL EXAM:  ECOG Performance status: 1  Vitals:   03/19/18 1018  BP: 117/73  Pulse: (!) 57  Resp: 14  Temp: 97.7 F (36.5 C)  SpO2: 95%   Filed Weights   03/19/18 1018  Weight: 231 lb (104.8 kg)    Physical Exam HEENT: No neck adenopathy.  No oropharyngeal lesions. Chest: Bilateral clear to auscultation. CVS: S1-S2 regular rate and rhythm. Abdomen: No palpable hepatomegaly. Lymphadenopathy: No axillary or inguinal  adenopathy. Extremities: No edema cyanosis.  LABORATORY DATA:  I have reviewed the labs as listed.  CBC    Component Value Date/Time   WBC 5.9 02/23/2018 0921   RBC 4.21 (L) 02/23/2018 0921   HGB 12.9 (L) 02/23/2018 0921   HCT 39.4 02/23/2018 0921   PLT 118 (L) 02/23/2018 0921   MCV 93.6 02/23/2018 0921   MCH 30.6 02/23/2018 0921   MCHC 32.7 02/23/2018 0921   RDW 16.5 (H) 02/23/2018 0921   LYMPHSABS 0.9 02/23/2018 0921   MONOABS 0.3 02/23/2018 0921   EOSABS 0.2 02/23/2018 0921   BASOSABS 0.1 02/23/2018 0921   CMP Latest Ref Rng & Units 02/23/2018 09/25/2017 07/14/2017  Glucose 65 - 99 mg/dL 230(H) 358(H) 408(H)  BUN 6 - 20 mg/dL 31(H) 23(H) 21(H)  Creatinine 0.61 - 1.24 mg/dL 1.52(H) 1.71(H) 1.60(H)  Sodium 135 - 145 mmol/L 140 137 135  Potassium 3.5 - 5.1 mmol/L 4.1 3.9 3.8  Chloride 101 - 111 mmol/L 105 99(L) 100(L)  CO2 22 - 32 mmol/L _0 Calcium 8.9 - 10.3 mg/dL 8.8(L) 8.8(L) 8.7(L)  Total Protein 6.5 - 8.1 g/dL 6.6 6.7 6.7  Total Bilirubin 0.3 - 1.2 mg/dL 1.2 0.7 1.2  Alkaline Phos 38 - 126 U/L 53 59 57  AST 15 - 41 U/L _1 ALT 17 - 63 U/L 17 16(L) 17  DIAGNOSTIC IMAGING:  I have independently reviewed his CT scans from #2017 and agree with report.     ASSESSMENT & PLAN:   Mantle cell lymphoma ([place]) 1.  Mantle cell lymphoma of the colon: -Diagnosed in December 2014 by colonoscopy.  Initial presentation with weight loss of 40 pounds. - Status post bendamustine and rituximab x6 from 09/14/2013 through 02/07/2014. - Maintenance rituximab from May 10 2014-04/01/2016. -Last CT scans in November 2017 with no evidence of adenopathy. -He does not have any B symptoms at this time.  His blood work including LDH was within normal limits.  He has mild thrombocytopenia which is also stable.  I have recommended a six-month follow-up visit with blood work.  We will do scans if clinical condition dictates.  2.  Prostate cancer: -Originally diagnosed around  2002 underwent prostatectomy and radiation therapy immediately.  We will check a PSA prior to next visit.  3.  CKD: -His creatinine is stable around 1.52.      Orders placed this encounter:  Orders Placed This Encounter  Procedures  . CBC with Differential/Platelet  . Comprehensive metabolic panel  . Lactate dehydrogenase  . PSA      Derek Jack, MD [facility] [phone]

## 2018-03-19 NOTE — Patient Instructions (Signed)
[facility] at [facility] Discharge Instructions  You saw Dr. Katragadda today.   Thank you for choosing  Cancer Center at [facility] to provide your oncology and hematology care.  To afford each patient quality time with our provider, please arrive at least 15 minutes before your scheduled appointment time.   If you have a lab appointment with the Cancer Center please come in thru the  Main Entrance and check in at the main information desk  You need to re-schedule your appointment should you arrive 10 or more minutes late.  We strive to give you quality time with our providers, and arriving late affects you and other patients whose appointments are after yours.  Also, if you no show three or more times for appointments you may be dismissed from the clinic at the providers discretion.     Again, thank you for choosing [facility].  Our hope is that these requests will decrease the amount of time that you wait before being seen by our physicians.       _____________________________________________________________  Should you have questions after your visit to [facility], please contact our office at [phone] between the hours of 8:30 a.m. and 4:30 p.m.  Voicemails left after 4:30 p.m. will not be returned until the following business day.  For prescription refill requests, have your pharmacy contact our office.       Resources For Cancer Patients and their Caregivers ? American Cancer Society: Can assist with transportation, wigs, general needs, runs Look Good Feel Better.        1-888-227-6333 ? Cancer Care: Provides financial assistance, online support groups, medication/co-pay assistance.  1-800-813-HOPE (4673) ? [facility] Assists [place] cancer patients and their families through emotional , educational and financial support.  336-427-4357 ? [place] DSS Where to apply for  food stamps, Medicaid and utility assistance. 336-342-1394 ? [facility]: Transportation to medical appointments. 336-347-2287 ? Social Security Administration: May apply for disability if have a Stage IV cancer. 336-342-7796 1-800-772-1213 ? [place] Aging, Disability and Transit Services: Assists with nutrition, care and transit needs. 336-349-2343  Cancer Center Support Programs:   > Cancer Support Group  2nd Tuesday of the month 1pm-2pm, Journey Room   > Creative Journey  3rd Tuesday of the month 1130am-1pm, Journey Room     

## 2018-04-20 ENCOUNTER — Encounter (HOSPITAL_COMMUNITY): Payer: Self-pay

## 2018-04-20 ENCOUNTER — Other Ambulatory Visit: Payer: Self-pay

## 2018-04-20 ENCOUNTER — Inpatient Hospital Stay (HOSPITAL_COMMUNITY): Payer: Medicare Other | Attending: Oncology

## 2018-04-20 VITALS — BP 100/62 | HR 65 | Temp 97.6°F | Resp 18

## 2018-04-20 DIAGNOSIS — E538 Deficiency of other specified B group vitamins: Secondary | ICD-10-CM | POA: Insufficient documentation

## 2018-04-20 DIAGNOSIS — C8319 Mantle cell lymphoma, extranodal and solid organ sites: Secondary | ICD-10-CM

## 2018-04-20 MED ORDER — CYANOCOBALAMIN 1000 MCG/ML IJ SOLN
1000.0000 ug | Freq: Once | INTRAMUSCULAR | Status: AC
  Administered 2018-04-20: 1000 ug via INTRAMUSCULAR

## 2018-04-20 MED ORDER — CYANOCOBALAMIN 1000 MCG/ML IJ SOLN
INTRAMUSCULAR | Status: AC
  Filled 2018-04-20: qty 1

## 2018-04-20 NOTE — Progress Notes (Signed)
[name] presents today for injection per MD orders. B12 1045mcg administered IM in left Upper Arm. Administration without incident. Patient tolerated well.

## 2018-04-24 ENCOUNTER — Other Ambulatory Visit: Payer: Self-pay | Admitting: Internal Medicine

## 2018-04-24 DIAGNOSIS — R918 Other nonspecific abnormal finding of lung field: Secondary | ICD-10-CM

## 2018-04-27 DIAGNOSIS — N481 Balanitis: Secondary | ICD-10-CM | POA: Diagnosis not present

## 2018-04-27 DIAGNOSIS — D631 Anemia in chronic kidney disease: Secondary | ICD-10-CM | POA: Diagnosis not present

## 2018-04-27 DIAGNOSIS — R42 Dizziness and giddiness: Secondary | ICD-10-CM | POA: Diagnosis not present

## 2018-04-27 DIAGNOSIS — H65199 Other acute nonsuppurative otitis media, unspecified ear: Secondary | ICD-10-CM | POA: Diagnosis not present

## 2018-04-27 DIAGNOSIS — E1140 Type 2 diabetes mellitus with diabetic neuropathy, unspecified: Secondary | ICD-10-CM | POA: Diagnosis not present

## 2018-04-30 DIAGNOSIS — E1122 Type 2 diabetes mellitus with diabetic chronic kidney disease: Secondary | ICD-10-CM | POA: Diagnosis not present

## 2018-04-30 DIAGNOSIS — N183 Chronic kidney disease, stage 3 (moderate): Secondary | ICD-10-CM | POA: Diagnosis not present

## 2018-04-30 DIAGNOSIS — D631 Anemia in chronic kidney disease: Secondary | ICD-10-CM | POA: Diagnosis not present

## 2018-04-30 DIAGNOSIS — G9009 Other idiopathic peripheral autonomic neuropathy: Secondary | ICD-10-CM | POA: Diagnosis not present

## 2018-05-04 ENCOUNTER — Encounter: Payer: Self-pay | Admitting: Adult Health

## 2018-05-20 ENCOUNTER — Other Ambulatory Visit (HOSPITAL_COMMUNITY): Payer: Self-pay | Admitting: Hematology

## 2018-05-22 ENCOUNTER — Encounter (HOSPITAL_COMMUNITY): Payer: Self-pay

## 2018-05-22 ENCOUNTER — Inpatient Hospital Stay (HOSPITAL_COMMUNITY): Payer: Medicare Other | Attending: Oncology

## 2018-05-22 ENCOUNTER — Other Ambulatory Visit: Payer: Self-pay

## 2018-05-22 VITALS — BP 120/65 | HR 69 | Temp 98.3°F | Resp 20

## 2018-05-22 DIAGNOSIS — E538 Deficiency of other specified B group vitamins: Secondary | ICD-10-CM | POA: Insufficient documentation

## 2018-05-22 DIAGNOSIS — J449 Chronic obstructive pulmonary disease, unspecified: Secondary | ICD-10-CM | POA: Diagnosis not present

## 2018-05-22 DIAGNOSIS — K219 Gastro-esophageal reflux disease without esophagitis: Secondary | ICD-10-CM | POA: Diagnosis not present

## 2018-05-22 DIAGNOSIS — C8319 Mantle cell lymphoma, extranodal and solid organ sites: Secondary | ICD-10-CM

## 2018-05-22 DIAGNOSIS — E1140 Type 2 diabetes mellitus with diabetic neuropathy, unspecified: Secondary | ICD-10-CM | POA: Diagnosis not present

## 2018-05-22 MED ORDER — HEPARIN SOD (PORK) LOCK FLUSH 100 UNIT/ML IV SOLN
500.0000 [IU] | Freq: Once | INTRAVENOUS | Status: AC
  Administered 2018-05-22: 500 [IU] via INTRAVENOUS
  Filled 2018-05-22: qty 5

## 2018-05-22 MED ORDER — SODIUM CHLORIDE 0.9% FLUSH
10.0000 mL | INTRAVENOUS | Status: DC | PRN
  Administered 2018-05-22: 10 mL via INTRAVENOUS
  Filled 2018-05-22: qty 10

## 2018-05-22 MED ORDER — CYANOCOBALAMIN 1000 MCG/ML IJ SOLN
1000.0000 ug | Freq: Once | INTRAMUSCULAR | Status: AC
  Administered 2018-05-22: 1000 ug via INTRAMUSCULAR
  Filled 2018-05-22: qty 1

## 2018-05-22 NOTE — Progress Notes (Signed)
[name] presents today for injection per MD orders. B12 1000 mcg administered SQ in right deltoid. Administration without incident. Patient tolerated well.  Portacath located right chest wall accessed with  H 20 needle.  Good blood return present. Portacath flushed with 6ml NS and 500U/23ml Heparin and needle removed intact.  Procedure tolerated well and without incident.     Patient tolerated treatment without incidence. Patient discharged ambulatory and in stable condition from clinic. Patient to follow up as scheduled.

## 2018-05-22 NOTE — Patient Instructions (Signed)
You had your B-12 injection today ,  Continue to get it monthly.  We also flushed your port today. Get it flushed again in 12 weeks.

## 2018-05-29 ENCOUNTER — Ambulatory Visit (HOSPITAL_COMMUNITY)
Admission: RE | Admit: 2018-05-29 | Discharge: 2018-05-29 | Disposition: A | Discharge status: Home / Self Care | Payer: Medicare Other | Source: Ambulatory Visit | Attending: Internal Medicine | Admitting: Internal Medicine

## 2018-05-29 DIAGNOSIS — R911 Solitary pulmonary nodule: Secondary | ICD-10-CM | POA: Insufficient documentation

## 2018-05-29 DIAGNOSIS — R918 Other nonspecific abnormal finding of lung field: Secondary | ICD-10-CM | POA: Diagnosis present

## 2018-05-29 NOTE — Progress Notes (Signed)
Left detailed msg on machine ok per DPR

## 2018-06-04 DIAGNOSIS — Z87440 Personal history of urinary (tract) infections: Secondary | ICD-10-CM | POA: Diagnosis not present

## 2018-06-09 DIAGNOSIS — Z23 Encounter for immunization: Secondary | ICD-10-CM | POA: Diagnosis not present

## 2018-06-17 ENCOUNTER — Ambulatory Visit (HOSPITAL_COMMUNITY): Payer: Medicare Other

## 2018-06-22 ENCOUNTER — Ambulatory Visit (HOSPITAL_COMMUNITY): Payer: Medicare Other

## 2018-06-23 ENCOUNTER — Ambulatory Visit (INDEPENDENT_AMBULATORY_CARE_PROVIDER_SITE_OTHER): Payer: Medicare Other | Admitting: Internal Medicine

## 2018-06-29 ENCOUNTER — Encounter (INDEPENDENT_AMBULATORY_CARE_PROVIDER_SITE_OTHER): Payer: Self-pay | Admitting: Internal Medicine

## 2018-06-29 ENCOUNTER — Other Ambulatory Visit: Payer: Self-pay | Admitting: Nurse Practitioner

## 2018-06-29 ENCOUNTER — Ambulatory Visit (INDEPENDENT_AMBULATORY_CARE_PROVIDER_SITE_OTHER): Payer: Medicare Other | Admitting: Internal Medicine

## 2018-06-29 VITALS — BP 110/80 | HR 65 | Temp 97.4°F | Resp 18 | Ht 71.0 in | Wt 239.2 lb

## 2018-06-29 DIAGNOSIS — K219 Gastro-esophageal reflux disease without esophagitis: Secondary | ICD-10-CM | POA: Diagnosis not present

## 2018-06-29 DIAGNOSIS — R1032 Left lower quadrant pain: Secondary | ICD-10-CM | POA: Diagnosis not present

## 2018-06-29 DIAGNOSIS — K5150 Left sided colitis without complications: Secondary | ICD-10-CM | POA: Diagnosis not present

## 2018-06-29 NOTE — Progress Notes (Signed)
Presenting complaint;  Left-sided abdominal pain.  History of ulcerative colitis, mantle cell colonic lymphoma and GERD.  Database and subjective:  Patient is 83 year old Caucasian male who is here for scheduled visit.  He was last seen in December 2017.  He states heartburn is well controlled with therapy.  He denies dysphagia nausea or vomiting.  He continues to complain of pain in left lower quadrant of abdomen.  He states he has mild pain virtually every day.  At times is more pronounced but not like he used to have it.  He feels he has more pain if his stool is hard.  He denies melena or rectal bleeding.  He says he has to pay $50 every 4 to 6 weeks when he gets his Port-A-Cath flushed.  He wants to have it removed.  He was placed by Dr. Reesa Chew in [place].  He also wonders when he should undergo next colonoscopy. He has very good appetite.  He has gained 8 pounds  since he was last seen.   Current Medications: Outpatient Encounter Medications as of 06/29/2018  Medication Sig  . acetaminophen (TYLENOL) 325 MG tablet Take 650 mg by mouth every 4 (four) hours as needed for mild pain or moderate pain. Take 2 tablets 1 hour prior to Rituxan treatment.  [name] albuterol (PROAIR HFA) 108 (90 Base) MCG/ACT inhaler Inhale 2 puffs into the lungs at bedtime as needed for wheezing or shortness of breath.   [name] amLODipine (NORVASC) 5 MG tablet TAKE ONE TABLET BY MOUTH ONCE DAILY.  [name] atorvastatin (LIPITOR) 20 MG tablet Take 20 mg by mouth daily.  . budesonide-formoterol (SYMBICORT) 160-4.5 MCG/ACT inhaler Inhale 2 puffs into the lungs 2 (two) times daily.  . Cyanocobalamin 1000 MCG/ML KIT Inject 1 mL as directed every 30 (thirty) days.  . diazepam (VALIUM) 5 MG tablet Take 5 mg by mouth at bedtime as needed (sleep).  . fluticasone (FLONASE) 50 MCG/ACT nasal spray Place 1 spray into both nostrils as needed for allergies or rhinitis.  [name] gabapentin (NEURONTIN) 300 MG capsule Take 300 mg by mouth daily as needed.    [name] glipiZIDE (GLUCOTROL) 10 MG tablet Take 10 mg by mouth 2 (two) times daily before a meal.   . hydrocortisone 2.5 % cream Apply 1 application topically as needed (itching).  . insulin lispro (HUMALOG) 100 UNIT/ML cartridge Inject 1-10 Units into the skin as directed. On sliding scale when eating a big meal   . ipratropium (ATROVENT) 0.06 % nasal spray Place 2 sprays into both nostrils 2 (two) times daily.   [name] levocetirizine (XYZAL) 5 MG tablet Take 5 mg by mouth every evening.   . lidocaine-prilocaine (EMLA) cream Apply quarter size amount over port site one hour prior to appointment and cover with plastic wrap.  . magnesium gluconate (MAGONATE) 500 MG tablet Take 500 mg by mouth daily.  . meclizine (ANTIVERT) 25 MG tablet Take 25 mg by mouth 2 (two) times daily as needed for dizziness.   [name] METRONIDAZOLE, TOPICAL, 0.75 % LOTN Uses prn. Hasn't used in a long time as of 04/20/18.  [name] ondansetron (ZOFRAN) 4 MG tablet Take by mouth as needed.   . pantoprazole (PROTONIX) 40 MG tablet TAKE 1 TABLET BY MOUTH 2 TIMES DAILY BEFORE A MEAL.  [name] propranolol (INDERAL) 40 MG tablet Take 1 tablet (40 mg total) by mouth 4 (four) times daily as needed.  . testosterone cypionate (DEPOTESTOSTERONE CYPIONATE) 200 MG/ML injection Inject 200 mg into the muscle every 14 (fourteen) days.   [name]  tobramycin (TOBREX) 0.3 % ophthalmic solution Place 1 drop into both eyes daily as needed.   Nelva Nay SOLOSTAR 300 UNIT/ML SOPN Inject 60 Units into the skin every morning.   . [DISCONTINUED] insulin aspart (NOVOLOG) 100 UNIT/ML injection Inject 20 Units into the skin 3 (three) times daily before meals.    . [DISCONTINUED] rosuvastatin (CRESTOR) 40 MG tablet Take 40 mg by mouth daily.     Facility-Administered Encounter Medications as of 06/29/2018  Medication  . heparin lock flush 100 unit/mL  . sodium chloride flush (NS) 0.9 % injection 20 mL     Objective: Blood pressure 110/80, pulse 65, temperature (!) 97.4 F (36.3 C),  temperature source Oral, resp. rate 18, height 5' 11"  (1.803 m), weight 239 lb 3.2 oz (108.5 kg). Patient is alert and in no acute distress. Conjunctiva is pink. Sclera is nonicteric Oropharyngeal mucosa is normal. No neck masses or thyromegaly noted. Cardiac exam with regular rhythm normal S1 and S2. No murmur or gallop noted. Lungs are clear to auscultation. Abdomen is full.  Bowel sounds are normal.  On palpation it soft.  He has mild tenderness at LLQ.  No organomegaly or masses. He is wearing depends. No LE edema or clubbing noted.  Assessment:  #1.  History of distal ulcerative colitis.  He remains in remission.  He is not taking any medications.  #2.  LLQ abdominal pain.  He has been diagnosed with IBS in the past.  He has been treated with hyoscyamine in the past.  However given his age he may be prone to side effects with his medication.  He will try using Tylenol and he needs to keep his stools soft which might help.  #3.  GERD.  He is on double dose PPI.  He is doing well.  It is worth trying to decrease the dose and see how he does.  #4.  History of mantle cell lymphoma of the colon.  He was diagnosed in December 2014 received chemotherapy.  He has undergone 2 colonoscopies since then and he has remained in remission.  No clear-cut guidelines for follow-up exam.  Will discuss with Dr. Delton Coombes.   Plan:  Refer back to Dr. Reesa Chew for removal of Port-A-Cath. Patient will try dropping pantoprazole dose to once every morning.  Will not change prescription until it is known that he can tolerate low-dose. Tylenol up to 2 g/day on as-needed basis for LLQ abdominal pain. Colace 200 mg p.o. daily PRN. Office visit in 1 year.

## 2018-06-29 NOTE — Patient Instructions (Addendum)
Can take Tylenol up to 2 g/day on as-needed basis. Can try decreasing pantoprazole dose to once daily as discussed.     Call with progress report in 6 to 8 weeks. Can take Colace 100 or 200 mg every other day in order to keep stool soft. Will arrange for a visit to remove Port-A-Cath.

## 2018-06-30 ENCOUNTER — Other Ambulatory Visit (INDEPENDENT_AMBULATORY_CARE_PROVIDER_SITE_OTHER): Payer: Self-pay | Admitting: Internal Medicine

## 2018-06-30 DIAGNOSIS — C8310 Mantle cell lymphoma, unspecified site: Secondary | ICD-10-CM

## 2018-06-30 NOTE — Telephone Encounter (Signed)
Outpatient Medication Detail    Disp Refills Start End   amLODipine (NORVASC) 5 MG tablet 90 tablet 1 03/10/2018    Sig: TAKE ONE TABLET BY MOUTH ONCE DAILY.   Sent to pharmacy as: amLODipine (NORVASC) 5 MG tablet   E-Prescribing Status: Receipt confirmed by pharmacy (03/10/2018 7:38 AM EDT)   Pharmacy   [place], [place]

## 2018-07-16 ENCOUNTER — Other Ambulatory Visit: Payer: Self-pay | Admitting: Student

## 2018-07-17 ENCOUNTER — Encounter (HOSPITAL_COMMUNITY): Payer: Self-pay

## 2018-07-17 ENCOUNTER — Ambulatory Visit (HOSPITAL_COMMUNITY)
Admission: RE | Admit: 2018-07-17 | Discharge: 2018-07-17 | Disposition: A | Discharge status: Home / Self Care | Payer: Medicare Other | Source: Ambulatory Visit | Attending: Internal Medicine | Admitting: Internal Medicine

## 2018-07-17 ENCOUNTER — Other Ambulatory Visit: Payer: Self-pay

## 2018-07-17 DIAGNOSIS — I10 Essential (primary) hypertension: Secondary | ICD-10-CM | POA: Diagnosis not present

## 2018-07-17 DIAGNOSIS — Z79899 Other long term (current) drug therapy: Secondary | ICD-10-CM | POA: Diagnosis not present

## 2018-07-17 DIAGNOSIS — Z91040 Latex allergy status: Secondary | ICD-10-CM | POA: Insufficient documentation

## 2018-07-17 DIAGNOSIS — Z452 Encounter for adjustment and management of vascular access device: Secondary | ICD-10-CM | POA: Diagnosis not present

## 2018-07-17 DIAGNOSIS — Z885 Allergy status to narcotic agent status: Secondary | ICD-10-CM | POA: Diagnosis not present

## 2018-07-17 DIAGNOSIS — E1151 Type 2 diabetes mellitus with diabetic peripheral angiopathy without gangrene: Secondary | ICD-10-CM | POA: Insufficient documentation

## 2018-07-17 DIAGNOSIS — K219 Gastro-esophageal reflux disease without esophagitis: Secondary | ICD-10-CM | POA: Insufficient documentation

## 2018-07-17 DIAGNOSIS — Z9852 Vasectomy status: Secondary | ICD-10-CM | POA: Insufficient documentation

## 2018-07-17 DIAGNOSIS — Z9079 Acquired absence of other genital organ(s): Secondary | ICD-10-CM | POA: Insufficient documentation

## 2018-07-17 DIAGNOSIS — I2510 Atherosclerotic heart disease of native coronary artery without angina pectoris: Secondary | ICD-10-CM | POA: Insufficient documentation

## 2018-07-17 DIAGNOSIS — Z881 Allergy status to other antibiotic agents status: Secondary | ICD-10-CM | POA: Insufficient documentation

## 2018-07-17 DIAGNOSIS — Z7951 Long term (current) use of inhaled steroids: Secondary | ICD-10-CM | POA: Insufficient documentation

## 2018-07-17 DIAGNOSIS — Z888 Allergy status to other drugs, medicaments and biological substances status: Secondary | ICD-10-CM | POA: Insufficient documentation

## 2018-07-17 DIAGNOSIS — C8310 Mantle cell lymphoma, unspecified site: Secondary | ICD-10-CM | POA: Diagnosis not present

## 2018-07-17 DIAGNOSIS — Z9889 Other specified postprocedural states: Secondary | ICD-10-CM | POA: Insufficient documentation

## 2018-07-17 DIAGNOSIS — G4733 Obstructive sleep apnea (adult) (pediatric): Secondary | ICD-10-CM | POA: Diagnosis not present

## 2018-07-17 DIAGNOSIS — E782 Mixed hyperlipidemia: Secondary | ICD-10-CM | POA: Diagnosis not present

## 2018-07-17 DIAGNOSIS — Z8572 Personal history of non-Hodgkin lymphomas: Secondary | ICD-10-CM | POA: Diagnosis not present

## 2018-07-17 DIAGNOSIS — Z85038 Personal history of other malignant neoplasm of large intestine: Secondary | ICD-10-CM | POA: Insufficient documentation

## 2018-07-17 DIAGNOSIS — Z8546 Personal history of malignant neoplasm of prostate: Secondary | ICD-10-CM | POA: Diagnosis not present

## 2018-07-17 DIAGNOSIS — Z809 Family history of malignant neoplasm, unspecified: Secondary | ICD-10-CM | POA: Insufficient documentation

## 2018-07-17 DIAGNOSIS — Z91030 Bee allergy status: Secondary | ICD-10-CM | POA: Diagnosis not present

## 2018-07-17 DIAGNOSIS — Z794 Long term (current) use of insulin: Secondary | ICD-10-CM | POA: Diagnosis not present

## 2018-07-17 DIAGNOSIS — Z5111 Encounter for antineoplastic chemotherapy: Secondary | ICD-10-CM | POA: Diagnosis not present

## 2018-07-17 HISTORY — PX: IR REMOVAL TUN ACCESS W/ PORT W/O FL MOD SED: IMG2290

## 2018-07-17 LAB — CBC
HCT: 50.9 % (ref 39.0–52.0)
Hemoglobin: 16 g/dL (ref 13.0–17.0)
MCH: 29.3 pg (ref 26.0–34.0)
MCHC: 31.4 g/dL (ref 30.0–36.0)
MCV: 93.1 fL (ref 80.0–100.0)
NRBC: 0 % (ref 0.0–0.2)
Platelets: 137 10*3/uL — ABNORMAL LOW (ref 150–400)
RBC: 5.47 MIL/uL (ref 4.22–5.81)
RDW: 14.5 % (ref 11.5–15.5)
WBC: 8.5 10*3/uL (ref 4.0–10.5)

## 2018-07-17 LAB — PROTIME-INR
INR: 0.94
Prothrombin Time: 12.5 seconds (ref 11.4–15.2)

## 2018-07-17 LAB — APTT: aPTT: 36 seconds (ref 24–36)

## 2018-07-17 LAB — GLUCOSE, CAPILLARY: GLUCOSE-CAPILLARY: 230 mg/dL — AB (ref 70–99)

## 2018-07-17 MED ORDER — FENTANYL CITRATE (PF) 100 MCG/2ML IJ SOLN
INTRAMUSCULAR | Status: AC | PRN
  Administered 2018-07-17 (×2): 50 ug via INTRAVENOUS

## 2018-07-17 MED ORDER — HEPARIN SOD (PORK) LOCK FLUSH 100 UNIT/ML IV SOLN
INTRAVENOUS | Status: AC
  Filled 2018-07-17: qty 5

## 2018-07-17 MED ORDER — CEFAZOLIN SODIUM-DEXTROSE 2-4 GM/100ML-% IV SOLN
INTRAVENOUS | Status: AC
  Administered 2018-07-17: 2 g via INTRAVENOUS
  Filled 2018-07-17: qty 100

## 2018-07-17 MED ORDER — MIDAZOLAM HCL 2 MG/2ML IJ SOLN
INTRAMUSCULAR | Status: AC | PRN
  Administered 2018-07-17 (×2): 1 mg via INTRAVENOUS

## 2018-07-17 MED ORDER — LIDOCAINE HCL 1 % IJ SOLN
INTRAMUSCULAR | Status: AC
  Filled 2018-07-17: qty 20

## 2018-07-17 MED ORDER — CEFAZOLIN SODIUM-DEXTROSE 2-4 GM/100ML-% IV SOLN
2.0000 g | Freq: Once | INTRAVENOUS | Status: AC
  Administered 2018-07-17: 2 g via INTRAVENOUS

## 2018-07-17 MED ORDER — LIDOCAINE HCL (PF) 1 % IJ SOLN
INTRAMUSCULAR | Status: AC | PRN
  Administered 2018-07-17: 10 mL

## 2018-07-17 MED ORDER — MIDAZOLAM HCL 2 MG/2ML IJ SOLN
INTRAMUSCULAR | Status: AC
  Filled 2018-07-17: qty 2

## 2018-07-17 MED ORDER — FENTANYL CITRATE (PF) 100 MCG/2ML IJ SOLN
INTRAMUSCULAR | Status: AC
  Filled 2018-07-17: qty 2

## 2018-07-17 MED ORDER — SODIUM CHLORIDE 0.9 % IV SOLN
INTRAVENOUS | Status: DC
  Administered 2018-07-17: 08:00:00 via INTRAVENOUS

## 2018-07-17 NOTE — Discharge Instructions (Signed)
Implanted Port Removal, Care After °Refer to this sheet in the next few weeks. These instructions provide you with information about caring for yourself after your procedure. Your health care provider may also give you more specific instructions. Your treatment has been planned according to current medical practices, but problems sometimes occur. Call your health care provider if you have any problems or questions after your procedure. °What can I expect after the procedure? °After the procedure, it is common to have: °· Soreness or pain near your incision. °· Some swelling or bruising near your incision. ° °Follow these instructions at home: °Medicines °· Take over-the-counter and prescription medicines only as told by your health care provider. °· If you were prescribed an antibiotic medicine, take it as told by your health care provider. Do not stop taking the antibiotic even if you start to feel better. °Bathing °· Do not take baths, swim, or use a hot tub until your health care provider approves. Ask your health care provider if you can take showers. You may only be allowed to take sponge baths for bathing. °Incision care °· Follow instructions from your health care provider about how to take care of your incision. Make sure you: °? Wash your hands with soap and water before you change your bandage (dressing). If soap and water are not available, use hand sanitizer. °? Change your dressing as told by your health care provider. °? Keep your dressing dry. °? Leave stitches (sutures), skin glue, or adhesive strips in place. These skin closures may need to stay in place for 2 weeks or longer. If adhesive strip edges start to loosen and curl up, you may trim the loose edges. Do not remove adhesive strips completely unless your health care provider tells you to do that. °· Check your incision area every day for signs of infection. Check for: °? More redness, swelling, or pain. °? More fluid or  blood. °? Warmth. °? Pus or a bad smell. °Driving °· If you received a sedative, do not drive for 24 hours after the procedure. °· If you did not receive a sedative, ask your health care provider when it is safe to drive. °Activity °· Return to your normal activities as told by your health care provider. Ask your health care provider what activities are safe for you. °· Until your health care provider says it is safe: °? Do not lift anything that is heavier than 10 lb (4.5 kg). °? Do not do activities that involve lifting your arms over your head. °General instructions °· Do not use any tobacco products, such as cigarettes, chewing tobacco, and e-cigarettes. Tobacco can delay healing. If you need help quitting, ask your health care provider. °· Keep all follow-up visits as told by your health care provider. This is important. °Contact a health care provider if: °· You have more redness, swelling, or pain around your incision. °· You have more fluid or blood coming from your incision. °· Your incision feels warm to the touch. °· You have pus or a bad smell coming from your incision. °· You have a fever. °· You have pain that is not relieved by your pain medicine. °Get help right away if: °· You have chest pain. °· You have difficulty breathing. °This information is not intended to replace advice given to you by your health care provider. Make sure you discuss any questions you have with your health care provider. °Document Released: 08/21/2015 Document Revised: 02/15/2016 Document Reviewed: 06/14/2015 °Elsevier Interactive Patient   Education © 2018 [employer] °Moderate Conscious Sedation, Adult, Care After °These instructions provide you with information about caring for yourself after your procedure. Your health care provider may also give you more specific instructions. Your treatment has been planned according to current medical practices, but problems sometimes occur. Call your health care provider if you have  any problems or questions after your procedure. °What can I expect after the procedure? °After your procedure, it is common: °· To feel sleepy for several hours. °· To feel clumsy and have poor balance for several hours. °· To have poor judgment for several hours. °· To vomit if you eat too soon. ° °Follow these instructions at home: °For at least 24 hours after the procedure: ° °· Do not: °? Participate in activities where you could fall or become injured. °? Drive. °? Use heavy machinery. °? Drink alcohol. °? Take sleeping pills or medicines that cause drowsiness. °? Make important decisions or sign legal documents. °? Take care of children on your own. °· Rest. °Eating and drinking °· Follow the diet recommended by your health care provider. °· If you vomit: °? Drink water, juice, or soup when you can drink without vomiting. °? Make sure you have little or no nausea before eating solid foods. °General instructions °· Have a responsible adult stay with you until you are awake and alert. °· Take over-the-counter and prescription medicines only as told by your health care provider. °· If you smoke, do not smoke without supervision. °· Keep all follow-up visits as told by your health care provider. This is important. °Contact a health care provider if: °· You keep feeling nauseous or you keep vomiting. °· You feel light-headed. °· You develop a rash. °· You have a fever. °Get help right away if: °· You have trouble breathing. °This information is not intended to replace advice given to you by your health care provider. Make sure you discuss any questions you have with your health care provider. °Document Released: 06/30/2013 Document Revised: 02/12/2016 Document Reviewed: 12/30/2015 °Elsevier Interactive Patient Education © 2018 [employer]. ° °

## 2018-07-17 NOTE — Procedures (Signed)
Interventional Radiology Procedure Note  Procedure: Port removal  Complications: None  Estimated Blood Loss: < 10 mL  Findings: Right chest port removed in entirety.  Wound irrigated and closed.  Venetia Night. Kathlene Cote, M.D Pager:  [phone]

## 2018-07-17 NOTE — H&P (Signed)
Chief Complaint: Patient was seen in consultation today for mantle cell lymphoma  Referring Physician(s): Rehman,Najeeb U  Supervising Physician: Aletta Edouard  Patient Status: [facility] - Out-pt  History of Present Illness: [name] is a 83 y.o. male with past medical history of GERD, HTN, PVD, DM, and mantle cell colonic lymphoma in remission x2 years.  He received chemotherapy via Port-A-Cath placed by Dr. Annamaria Boots 09/13/2013.  Given disease stability, he no longer needs his Port and is referred back to IR for removal.   Patient presents for procedure today in his usual state of health.  He has been NPO.  He does not take blood thinners.   Past Medical History:  Diagnosis Date  . B12 deficiency 02/07/2014  . Colon cancer ([place])   . 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 Essential hypertension, benign   . GERD (gastroesophageal reflux disease)   . Mixed hyperlipidemia   . Obstructive sleep apnea    does not use  . Prostate cancer ([place])   . PVD (peripheral vascular disease) ([place])   . Type 2 diabetes mellitus ([place])     Past Surgical History:  Procedure Laterality Date  . BACK SURGERY    . BALLOON DILATION N/A 08/27/2013   Procedure: BALLOON DILATION;  Surgeon: Rogene Houston, MD;  [facility];  Service: Endoscopy;  Laterality: N/A;  . COLON SURGERY    . COLONOSCOPY N/A 12/17/2013   Procedure: COLONOSCOPY;  Surgeon: Rogene Houston, MD;  [facility];  Service: Endoscopy;  Laterality: N/A;  940  . COLONOSCOPY N/A 02/28/2014   Procedure: COLONOSCOPY;  Surgeon: Rogene Houston, MD;  [facility]: [facility];  Service: Endoscopy;  Laterality: N/A;  730  . COLONOSCOPY N/A 08/04/2015   Procedure: COLONOSCOPY;  Surgeon: Rogene Houston, MD;  [facility]: [facility];  Service: Endoscopy;  Laterality: N/A;  200 - moved to 11/11 @ 2:10 - Ann to notify pt  .  COLONOSCOPY WITH ESOPHAGOGASTRODUODENOSCOPY (EGD) N/A 08/27/2013   Procedure: COLONOSCOPY WITH ESOPHAGOGASTRODUODENOSCOPY (EGD);  Surgeon: Rogene Houston, MD;  [facility];  Service: Endoscopy;  Laterality: N/A;  730  . KNEE ARTHROSCOPY Left   . KNEE SURGERY Right    total knee  . MALONEY DILATION N/A 08/27/2013   Procedure: Venia Minks DILATION;  Surgeon: Rogene Houston, MD;  [facility];  Service: Endoscopy;  Laterality: N/A;  . PORTACATH PLACEMENT Right 2014  . PROSTATECTOMY    . removal of port Right   . SAVORY DILATION N/A 08/27/2013   Procedure: SAVORY DILATION;  Surgeon: Rogene Houston, MD;  [facility];  Service: Endoscopy;  Laterality: N/A;  . SHOULDER SURGERY    . TONSILLECTOMY    . VASECTOMY      Allergies: Bee venom; Adhesive [tape]; Ciprofloxacin; Codeine; Iodine; Latex; Povidone-iodine; and Simvastatin  Medications: Prior to Admission medications   Medication Sig Start Date End Date Taking? Authorizing Provider  acetaminophen (TYLENOL) 325 MG tablet Take 650 mg by mouth every 4 (four) hours as needed for mild pain or moderate pain. Take 2 tablets 1 hour prior to Rituxan treatment.   Yes [provider]  albuterol (PROAIR HFA) 108 (90 Base) MCG/ACT inhaler Inhale 2 puffs into the lungs at bedtime as needed for wheezing or shortness of breath.    Yes [provider]  amLODipine (NORVASC) 5 MG  tablet TAKE ONE TABLET BY MOUTH ONCE DAILY. 03/10/18  Yes Burtis Junes, NP  atorvastatin (LIPITOR) 20 MG tablet Take 20 mg by mouth daily.   Yes [provider]  budesonide-formoterol (SYMBICORT) 160-4.5 MCG/ACT inhaler Inhale 2 puffs into the lungs 2 (two) times daily.   Yes [provider]  docusate sodium (COLACE) 100 MG capsule Take 200 mg by mouth daily as needed for mild constipation.   Yes [provider]  fluticasone (FLONASE) 50 MCG/ACT nasal spray Place 1 spray into both nostrils as needed for  allergies or rhinitis.   Yes [provider]  gabapentin (NEURONTIN) 300 MG capsule Take 300 mg by mouth daily as needed.  03/17/17  Yes [provider]  glipiZIDE (GLUCOTROL) 10 MG tablet Take 10 mg by mouth 2 (two) times daily before a meal.  01/09/16  Yes [provider]  hydrocortisone 2.5 % cream Apply 1 application topically as needed (itching).   Yes [provider]  insulin lispro (HUMALOG) 100 UNIT/ML cartridge Inject 1-10 Units into the skin as directed. On sliding scale when eating a big meal    Yes [provider]  ipratropium (ATROVENT) 0.06 % nasal spray Place 2 sprays into both nostrils 2 (two) times daily.  04/15/17  Yes [provider]  levocetirizine (XYZAL) 5 MG tablet Take 5 mg by mouth every evening.  04/07/18  Yes [provider]  magnesium gluconate (MAGONATE) 500 MG tablet Take 500 mg by mouth daily.   Yes [provider]  meclizine (ANTIVERT) 25 MG tablet Take 25 mg by mouth 2 (two) times daily as needed for dizziness.  05/01/17  Yes [provider]  ondansetron (ZOFRAN) 4 MG tablet Take by mouth as needed.    Yes [provider]  pantoprazole (PROTONIX) 40 MG tablet TAKE 1 TABLET BY MOUTH 2 TIMES DAILY BEFORE A MEAL. 03/10/18  Yes Rehman, Mechele Dawley, MD  propranolol (INDERAL) 40 MG tablet Take 1 tablet (40 mg total) by mouth 4 (four) times daily as needed. 11/17/17  Yes Melvenia Beam, MD  testosterone cypionate (DEPOTESTOSTERONE CYPIONATE) 200 MG/ML injection Inject 200 mg into the muscle every 14 (fourteen) days.  04/29/16  Yes [provider]  tobramycin (TOBREX) 0.3 % ophthalmic solution Place 1 drop into both eyes daily as needed.    Yes [provider]  TOUJEO SOLOSTAR 300 UNIT/ML SOPN Inject 60 Units into the skin every morning.  03/23/16  Yes [provider]  Cyanocobalamin 1000 MCG/ML KIT Inject 1 mL as directed every 30 (thirty) days.    [provider]    diazepam (VALIUM) 5 MG tablet Take 5 mg by mouth at bedtime as needed (sleep).    [provider]  lidocaine-prilocaine (EMLA) cream Apply quarter size amount over port site one hour prior to appointment and cover with plastic wrap. 05/05/17   Twana First, MD  METRONIDAZOLE, TOPICAL, 0.75 % LOTN Uses prn. Hasn't used in a long time as of 04/20/18. 04/07/18   [provider]  insulin aspart (NOVOLOG) 100 UNIT/ML injection Inject 20 Units into the skin 3 (three) times daily before meals.    12/13/11  [provider]  rosuvastatin (CRESTOR) 40 MG tablet Take 40 mg by mouth daily.    12/13/11  [provider]     Family History  Problem Relation Age of Onset  . Colon cancer Brother   . Cancer Brother   . Diabetes Father   . Cancer Brother  Social History   Socioeconomic History  . Marital status: Married    Spouse name: [name]  . Number of children: 2  . Years of education: 9th grade  . Highest education level: Not on file  Occupational History  . Occupation: Retired     Fish farm manager: RETIRED  Social Needs  . Financial resource strain: Not on file  . Food insecurity:    Worry: Not on file    Inability: Not on file  . Transportation needs:    Medical: Not on file    Non-medical: Not on file  Tobacco Use  . Smoking status: Never Smoker  . Smokeless tobacco: Never Used  Substance and Sexual Activity  . Alcohol use: No  . Drug use: No  . Sexual activity: Yes    Birth control/protection: None  Lifestyle  . Physical activity:    Days per week: Not on file    Minutes per session: Not on file  . Stress: Not on file  Relationships  . Social connections:    Talks on phone: Not on file    Gets together: Not on file    Attends religious service: Not on file    Active member of club or organization: Not on file    Attends meetings of clubs or organizations: Not on file    Relationship status: Not on file  Other Topics Concern  . Not on file   Social History Narrative   Lives with wife   Caffeine use: coffee 1 cup some days of the week   Right-handed     Review of Systems  Constitutional: Negative for fatigue and fever.  Respiratory: Negative for cough and shortness of breath.   Cardiovascular: Negative for chest pain.  Gastrointestinal: Negative for abdominal pain.  Musculoskeletal: Negative for back pain.  Psychiatric/Behavioral: Negative for behavioral problems and confusion.    Vital Signs: BP (!) 144/87 (BP Location: Right Arm)   Pulse 66   Temp 98.3 F (36.8 C) (Oral)   Resp 18   SpO2 95%   Physical Exam  Constitutional: He is oriented to person, place, and time. He appears well-developed. No distress.  Neck: Normal range of motion. Neck supple.  Right Port-A-cath in place  Cardiovascular: Normal rate, regular rhythm and normal heart sounds. Exam reveals no gallop and no friction rub.  No murmur heard. Pulmonary/Chest: Effort normal. No respiratory distress. He has no wheezes.  congestion  Neurological: He is alert and oriented to person, place, and time.  Skin: Skin is warm and dry. He is not diaphoretic.  Psychiatric: He has a normal mood and affect. His behavior is normal. Judgment and thought content normal.  Nursing note and vitals reviewed.    MD Evaluation Airway: WNL Heart: WNL Abdomen: WNL Chest/ Lungs: WNL ASA  Classification: 3 Mallampati/Airway Score: Two   Imaging: No results found.  Labs:  CBC: Recent Labs    09/25/17 1029 02/23/18 0921 07/17/18 0802  WBC 8.3 5.9 8.5  HGB 14.9 12.9* 16.0  HCT 46.1 39.4 50.9  PLT 123* 118* 137*    COAGS: Recent Labs    07/17/18 0802  INR 0.94  APTT 36    BMP: Recent Labs    09/25/17 1029 02/23/18 0921  NA 137 140  K 3.9 4.1  CL 99* 105  CO2 25 28  GLUCOSE 358* 230*  BUN 23* 31*  CALCIUM 8.8* 8.8*  CREATININE 1.71* 1.52*  GFRNONAA 36* 42*  GFRAA 42* 48*    LIVER FUNCTION TESTS:  Recent Labs    09/25/17 1029  02/23/18 0921  BILITOT 0.7 1.2  AST 21 19  ALT 16* 17  ALKPHOS 59 53  PROT 6.7 6.6  ALBUMIN 4.0 3.9    TUMOR MARKERS: No results for input(s): AFPTM, CEA, CA199, CHROMGRNA in the last 8760 hours.  Assessment and Plan: Patient with past medical history of mantle cell lymphoma in remission presents for Port-A-Cath removal at the request of Dr. Laural Golden. Patient presents today in their usual state of health.  He has been NPO and is not currently on blood thinners.   Risks and benefits of image guided port-a-catheter placement was discussed with the patient including, but not limited to bleeding, infection, and need for additional procedures.  All of the patient's questions were answered, patient is agreeable to proceed. Consent signed and in chart.  Thank you for this interesting consult.  I greatly enjoyed meeting [name] and look forward to participating in their care.  A copy of this report was sent to the requesting provider on this date.  Electronically Signed: Docia Barrier, PA 07/17/2018, 8:51 AM   I spent a total of    15 Minutes in face to face in clinical consultation, greater than 50% of which was counseling/coordinating care for mantle cell lymphoma.

## 2018-07-20 DIAGNOSIS — J441 Chronic obstructive pulmonary disease with (acute) exacerbation: Secondary | ICD-10-CM | POA: Diagnosis not present

## 2018-07-24 ENCOUNTER — Inpatient Hospital Stay (HOSPITAL_COMMUNITY): Payer: Medicare Other | Attending: Oncology

## 2018-07-24 ENCOUNTER — Other Ambulatory Visit: Payer: Self-pay

## 2018-07-24 VITALS — BP 144/81 | HR 81 | Temp 97.6°F | Resp 18

## 2018-07-24 DIAGNOSIS — C8319 Mantle cell lymphoma, extranodal and solid organ sites: Secondary | ICD-10-CM

## 2018-07-24 DIAGNOSIS — E538 Deficiency of other specified B group vitamins: Secondary | ICD-10-CM | POA: Insufficient documentation

## 2018-07-24 MED ORDER — CYANOCOBALAMIN 1000 MCG/ML IJ SOLN
1000.0000 ug | Freq: Once | INTRAMUSCULAR | Status: AC
  Administered 2018-07-24: 1000 ug via INTRAMUSCULAR

## 2018-07-24 MED ORDER — SODIUM CHLORIDE 0.9% FLUSH: 10.0000 mL | INTRAVENOUS | Status: DC | PRN

## 2018-07-24 MED ORDER — HEPARIN SOD (PORK) LOCK FLUSH 100 UNIT/ML IV SOLN
500.0000 [IU] | Freq: Once | INTRAVENOUS | Status: DC
  Filled 2018-07-24: qty 5

## 2018-07-24 MED ORDER — CYANOCOBALAMIN 1000 MCG/ML IJ SOLN
INTRAMUSCULAR | Status: AC
  Filled 2018-07-24: qty 1

## 2018-07-24 NOTE — Progress Notes (Signed)
[name] presents today for injection per the provider's orders.  B12 administration without incident; see MAR for injection details.  Patient tolerated procedure well and without incident.  No questions or complaints noted at this time.  Discharged ambulatory.

## 2018-07-31 DIAGNOSIS — R05 Cough: Secondary | ICD-10-CM | POA: Diagnosis not present

## 2018-07-31 DIAGNOSIS — D519 Vitamin B12 deficiency anemia, unspecified: Secondary | ICD-10-CM | POA: Diagnosis not present

## 2018-07-31 DIAGNOSIS — E1140 Type 2 diabetes mellitus with diabetic neuropathy, unspecified: Secondary | ICD-10-CM | POA: Diagnosis not present

## 2018-07-31 DIAGNOSIS — E1122 Type 2 diabetes mellitus with diabetic chronic kidney disease: Secondary | ICD-10-CM | POA: Diagnosis not present

## 2018-08-06 DIAGNOSIS — D631 Anemia in chronic kidney disease: Secondary | ICD-10-CM | POA: Diagnosis not present

## 2018-08-06 DIAGNOSIS — G9009 Other idiopathic peripheral autonomic neuropathy: Secondary | ICD-10-CM | POA: Diagnosis not present

## 2018-08-06 DIAGNOSIS — N183 Chronic kidney disease, stage 3 (moderate): Secondary | ICD-10-CM | POA: Diagnosis not present

## 2018-08-06 DIAGNOSIS — E1122 Type 2 diabetes mellitus with diabetic chronic kidney disease: Secondary | ICD-10-CM | POA: Diagnosis not present

## 2018-08-10 DIAGNOSIS — J441 Chronic obstructive pulmonary disease with (acute) exacerbation: Secondary | ICD-10-CM | POA: Diagnosis not present

## 2018-08-10 DIAGNOSIS — H6501 Acute serous otitis media, right ear: Secondary | ICD-10-CM | POA: Diagnosis not present

## 2018-08-24 ENCOUNTER — Ambulatory Visit (HOSPITAL_COMMUNITY): Payer: Medicare Other

## 2018-08-28 DIAGNOSIS — J060 Acute laryngopharyngitis: Secondary | ICD-10-CM | POA: Diagnosis not present

## 2018-08-31 ENCOUNTER — Other Ambulatory Visit: Payer: Self-pay | Admitting: Nurse Practitioner

## 2018-08-31 ENCOUNTER — Ambulatory Visit: Payer: Medicare Other | Admitting: Nurse Practitioner

## 2018-08-31 ENCOUNTER — Encounter: Payer: Self-pay | Admitting: Nurse Practitioner

## 2018-08-31 VITALS — BP 130/90 | HR 70 | Ht 71.0 in | Wt 239.4 lb

## 2018-08-31 DIAGNOSIS — I10 Essential (primary) hypertension: Secondary | ICD-10-CM | POA: Diagnosis not present

## 2018-08-31 DIAGNOSIS — E7800 Pure hypercholesterolemia, unspecified: Secondary | ICD-10-CM

## 2018-08-31 DIAGNOSIS — I259 Chronic ischemic heart disease, unspecified: Secondary | ICD-10-CM | POA: Diagnosis not present

## 2018-08-31 NOTE — Progress Notes (Signed)
CARDIOLOGY OFFICE NOTE  Date:  08/31/2018    [name] Date of Birth: [birth year] Medical Record #[id number]  PCP:  Celene Squibb, MD  Cardiologist:  Servando Snare     Chief Complaint  Patient presents with  . Coronary Artery Disease    1 year check     History of Present Illness: [name] is a 83 y.o. male who presents today for a one year check. Former patient of Dr. Maren Beach. Last seen in May of 2014 by Dr. Domenic Polite.Basically follows with me.  He has known CAD with no prior history of PCI. Other issues include colon cancer, HTN, GERD, HLD, PVDand OSA. Remote stress test from 2008. Reported echocardiogram from April 2011 noted upper normal LV wall thickness with LVEF 03-70%, grade 2 diastolic dysfunction, trivial aortic regurgitation, mild biatrial enlargement.  I saw himback in October of 2017 after a 3 year absence- he was needing left hand surgery and needing clearance. He hadnot been back here "because nothing is wrong with me". He was off most of his medicines other than Inderal and his med list did not match his PCP's. BP high here and at home. No symptoms. I put him on Norvasc - I was not convinced that he would stay on long term.Last seen in December of 2018 - history remained pretty hard to follow but seemed to be doing ok. Chronic DOE. Cardiac status seemed stable.  Comes in today. Here alone.Has had a good year. Still works at the [employer] 2 days a week. Feeling good. No chest pain. Breathing is "very well". His sugars are running high - he has been on some Prednisone. BP is fair. Overall he has no real concerns and feels like he is doing ok.   Past Medical History:  Diagnosis Date  . B12 deficiency 02/07/2014  . Colon cancer ([place])   . 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 Essential hypertension, benign   . GERD (gastroesophageal reflux  disease)   . Mixed hyperlipidemia   . Obstructive sleep apnea    does not use  . Prostate cancer ([place])   . PVD (peripheral vascular disease) ([place])   . Type 2 diabetes mellitus ([place])     Past Surgical History:  Procedure Laterality Date  . BACK SURGERY    . BALLOON DILATION N/A 08/27/2013   Procedure: BALLOON DILATION;  Surgeon: Rogene Houston, MD;  [facility];  Service: Endoscopy;  Laterality: N/A;  . COLON SURGERY    . COLONOSCOPY N/A 12/17/2013   Procedure: COLONOSCOPY;  Surgeon: Rogene Houston, MD;  [facility];  Service: Endoscopy;  Laterality: N/A;  940  . COLONOSCOPY N/A 02/28/2014   Procedure: COLONOSCOPY;  Surgeon: Rogene Houston, MD;  [facility];  Service: Endoscopy;  Laterality: N/A;  730  . COLONOSCOPY N/A 08/04/2015   Procedure: COLONOSCOPY;  Surgeon: Rogene Houston, MD;  [facility];  Service: Endoscopy;  Laterality: N/A;  200 - moved to 11/11 @ 2:10 - Ann to notify pt  . COLONOSCOPY WITH ESOPHAGOGASTRODUODENOSCOPY (EGD) N/A 08/27/2013   Procedure: COLONOSCOPY WITH ESOPHAGOGASTRODUODENOSCOPY (EGD);  Surgeon: Rogene Houston, MD;  [facility];  Service: Endoscopy;  Laterality: N/A;  730  . IR REMOVAL TUN ACCESS W/ PORT W/O FL MOD SED  07/17/2018  . KNEE ARTHROSCOPY Left   . KNEE  SURGERY Right    total knee  . MALONEY DILATION N/A 08/27/2013   Procedure: Venia Minks DILATION;  Surgeon: Rogene Houston, MD;  [facility];  Service: Endoscopy;  Laterality: N/A;  . PORTACATH PLACEMENT Right 2014  . PROSTATECTOMY    . removal of port Right   . SAVORY DILATION N/A 08/27/2013   Procedure: SAVORY DILATION;  Surgeon: Rogene Houston, MD;  [facility];  Service: Endoscopy;  Laterality: N/A;  . SHOULDER SURGERY    . TONSILLECTOMY    . VASECTOMY       Medications: Current Meds  Medication Sig  . acetaminophen (TYLENOL) 325 MG tablet Take 650 mg by mouth every 4 (four) hours as needed for mild pain  or moderate pain. Take 2 tablets 1 hour prior to Rituxan treatment.  [name] albuterol (PROAIR HFA) 108 (90 Base) MCG/ACT inhaler Inhale 2 puffs into the lungs at bedtime as needed for wheezing or shortness of breath.   [name] amLODipine (NORVASC) 5 MG tablet TAKE ONE TABLET BY MOUTH ONCE DAILY.  [name] atorvastatin (LIPITOR) 20 MG tablet Take 20 mg by mouth daily.  . budesonide-formoterol (SYMBICORT) 160-4.5 MCG/ACT inhaler Inhale 2 puffs into the lungs 2 (two) times daily.  . Cyanocobalamin 1000 MCG/ML KIT Inject 1 mL as directed every 30 (thirty) days.  . diazepam (VALIUM) 5 MG tablet Take 5 mg by mouth at bedtime as needed (sleep).  . docusate sodium (COLACE) 100 MG capsule Take 200 mg by mouth daily as needed for mild constipation.  . fluticasone (FLONASE) 50 MCG/ACT nasal spray Place 1 spray into both nostrils as needed for allergies or rhinitis.  [name] gabapentin (NEURONTIN) 300 MG capsule Take 300 mg by mouth daily as needed.   [name] glipiZIDE (GLUCOTROL) 10 MG tablet Take 10 mg by mouth 2 (two) times daily before a meal.   . hydrocortisone 2.5 % cream Apply 1 application topically as needed (itching).  . insulin lispro (HUMALOG) 100 UNIT/ML cartridge Inject 1-10 Units into the skin as directed. On sliding scale when eating a big meal   . ipratropium (ATROVENT) 0.06 % nasal spray Place 2 sprays into both nostrils 2 (two) times daily.   [name] levocetirizine (XYZAL) 5 MG tablet Take 5 mg by mouth every evening.   . lidocaine-prilocaine (EMLA) cream Apply quarter size amount over port site one hour prior to appointment and cover with plastic wrap.  . magnesium gluconate (MAGONATE) 500 MG tablet Take 500 mg by mouth daily.  . meclizine (ANTIVERT) 25 MG tablet Take 25 mg by mouth 2 (two) times daily as needed for dizziness.   [name] METRONIDAZOLE, TOPICAL, 0.75 % LOTN Uses prn. Hasn't used in a long time as of 04/20/18.  [name] ondansetron (ZOFRAN) 4 MG tablet Take by mouth as needed.   . pantoprazole (PROTONIX) 40 MG tablet TAKE 1  TABLET BY MOUTH 2 TIMES DAILY BEFORE A MEAL.  [name] propranolol (INDERAL) 40 MG tablet Take 1 tablet (40 mg total) by mouth 4 (four) times daily as needed.  . testosterone cypionate (DEPOTESTOSTERONE CYPIONATE) 200 MG/ML injection Inject 200 mg into the muscle every 14 (fourteen) days.   [name] tobramycin (TOBREX) 0.3 % ophthalmic solution Place 1 drop into both eyes daily as needed.   Nelva Nay SOLOSTAR 300 UNIT/ML SOPN Inject 60 Units into the skin every morning.      Allergies: Allergies  Allergen Reactions  . Bee Venom Anaphylaxis  . Adhesive [Tape] Hives  . Ciprofloxacin     Unknown   .  Codeine     Unknown    . Iodine     Unknown    . Latex     Unknown    . Povidone-Iodine     Unknown    . Simvastatin     Unknown      Social History: The patient  reports that he has never smoked. He has never used smokeless tobacco. He reports that he does not drink alcohol or use drugs.   Family History: The patient's family history includes Cancer in his brother and brother; Colon cancer in his brother; Diabetes in his father.   Review of Systems: Please see the history of present illness.   Otherwise, the review of systems is positive for none.   All other systems are reviewed and negative.   Physical Exam: VS:  BP 130/90   Pulse 70   Ht _0  (1.803 m)   Wt 239 lb 6.4 oz (108.6 kg)   BMI 33.39 kg/m  .  BMI Body mass index is 33.39 kg/m.  Wt Readings from Last 3 Encounters:  08/31/18 239 lb 6.4 oz (108.6 kg)  06/29/18 239 lb 3.2 oz (108.5 kg)  03/19/18 231 lb (104.8 kg)    General: Elderly. Alert and in no acute distress. He remains obese.   HEENT: Normal.  Neck: Supple, no JVD, carotid bruits, or masses noted.  Cardiac: Regular rate and rhythm. No murmurs, rubs, or gallops. No edema.  Respiratory:  Lungs are clear to auscultation bilaterally with normal work of breathing.  GI: Soft and nontender.  MS: No deformity or atrophy. Gait and ROM intact.  Skin: Warm and dry.  Color is normal.  Neuro:  Strength and sensation are intact and no gross focal deficits noted.  Psych: Alert, appropriate and with normal affect.   LABORATORY DATA:  EKG:  EKG is ordered today. This demonstrates NSR.  Lab Results  Component Value Date   WBC 8.5 07/17/2018   HGB 16.0 07/17/2018   HCT 50.9 07/17/2018   PLT 137 (L) 07/17/2018   GLUCOSE 230 (H) 02/23/2018   CHOL 195 05/14/2010   TRIG 115.0 05/14/2010   HDL 55.40 05/14/2010   LDLCALC 117 (H) 05/14/2010   ALT 17 02/23/2018   AST 19 02/23/2018   NA 140 02/23/2018   K 4.1 02/23/2018   CL 105 02/23/2018   CREATININE 1.52 (H) 02/23/2018   BUN 31 (H) 02/23/2018   CO2 28 02/23/2018   TSH 2.030 10/17/2015   PSA <0.01 11/28/2016   INR 0.94 07/17/2018       BNP (last 3 results) No results for input(s): BNP in the last 8760 hours.  ProBNP (last 3 results) No results for input(s): PROBNP in the last 8760 hours.   Other Studies Reviewed Today:  CT CHESTIMPRESSION12/2017: No lesion is seen corresponding to the area of concern over the left lower hemithorax seen on the scout tomogram of most recent study. There is currently no chest wall lesion. There is no new parenchymal lung lesion. The largest ground-glass nodular lesions seen on previous study is smaller currently. As this lesion does persist, recommendations from CT examination September 2017 remain in effect. No new nodular opacities. No adenopathy. There is atherosclerotic calcification at multiple sites including multiple foci of coronary artery calcification.   Electronically Signed By: Lowella Grip III M.D. On: 09/09/2016 09:25  Assessment/Plan:  1. CAD - no active symptoms noted. EKG is stable. He is to continue with CV risk factor modification - no changes made  today.   2. HTN -BP is fair. Will follow.   3. HLD -now on statin - labs from his [facility] noted.   4. History of medication noncompliance - seems to not be an issue  at this time.   Current medicines are reviewed with the patient today.  The patient does not have concerns regarding medicines other than what has been noted above.  The following changes have been made:  See above.  Labs/ tests ordered today include:    Orders Placed This Encounter  Procedures  . EKG 12-Lead     Disposition:   FU with me in 1 year.   Patient is agreeable to this plan and will call if any problems develop in the interim.   SignedTruitt Merle, NP  08/31/2018 9:19 AM  [facility] [address] [place], [place]  [address] Phone: [phone] Fax: [phone]

## 2018-08-31 NOTE — Patient Instructions (Addendum)
We will be checking the following labs today - NONE   Medication Instructions:    Continue with your current medicines.    If you need a refill on your cardiac medications before your next appointment, please call your pharmacy.     Testing/Procedures To Be Arranged:  N/A  Follow-Up:   See me in one year.     At [facility], you and your health needs are our priority.  As part of our continuing mission to provide you with exceptional heart care, we have created designated Provider Care Teams.  These Care Teams include your primary Cardiologist (physician) and Advanced Practice Providers (APPs -  Physician Assistants and Nurse Practitioners) who all work together to provide you with the care you need, when you need it.  Special Instructions:  . None  Call the [place] office at [phone] if you have any questions, problems or concerns.

## 2018-09-03 DIAGNOSIS — M79671 Pain in right foot: Secondary | ICD-10-CM | POA: Diagnosis not present

## 2018-09-03 DIAGNOSIS — E1140 Type 2 diabetes mellitus with diabetic neuropathy, unspecified: Secondary | ICD-10-CM | POA: Diagnosis not present

## 2018-09-03 DIAGNOSIS — L110 Acquired keratosis follicularis: Secondary | ICD-10-CM | POA: Diagnosis not present

## 2018-09-03 DIAGNOSIS — M79672 Pain in left foot: Secondary | ICD-10-CM | POA: Diagnosis not present

## 2018-09-03 DIAGNOSIS — L600 Ingrowing nail: Secondary | ICD-10-CM | POA: Diagnosis not present

## 2018-09-09 ENCOUNTER — Ambulatory Visit: Payer: Medicare Other | Admitting: Nurse Practitioner

## 2018-09-21 ENCOUNTER — Other Ambulatory Visit (HOSPITAL_COMMUNITY): Payer: Medicare Other

## 2018-09-24 ENCOUNTER — Ambulatory Visit (HOSPITAL_COMMUNITY): Payer: Medicare Other | Admitting: Hematology

## 2018-09-25 ENCOUNTER — Ambulatory Visit (HOSPITAL_COMMUNITY): Payer: Medicare Other | Admitting: Hematology

## 2018-09-25 ENCOUNTER — Ambulatory Visit (HOSPITAL_COMMUNITY): Payer: Medicare Other

## 2018-09-30 ENCOUNTER — Other Ambulatory Visit (INDEPENDENT_AMBULATORY_CARE_PROVIDER_SITE_OTHER): Payer: Self-pay | Admitting: Internal Medicine

## 2018-09-30 DIAGNOSIS — K219 Gastro-esophageal reflux disease without esophagitis: Secondary | ICD-10-CM

## 2018-11-02 ENCOUNTER — Encounter: Payer: Self-pay | Admitting: Neurology

## 2018-11-09 DIAGNOSIS — E1140 Type 2 diabetes mellitus with diabetic neuropathy, unspecified: Secondary | ICD-10-CM | POA: Diagnosis not present

## 2018-11-09 DIAGNOSIS — C8310 Mantle cell lymphoma, unspecified site: Secondary | ICD-10-CM | POA: Diagnosis not present

## 2018-11-09 DIAGNOSIS — E1122 Type 2 diabetes mellitus with diabetic chronic kidney disease: Secondary | ICD-10-CM | POA: Diagnosis not present

## 2018-11-09 DIAGNOSIS — D519 Vitamin B12 deficiency anemia, unspecified: Secondary | ICD-10-CM | POA: Diagnosis not present

## 2018-11-12 DIAGNOSIS — N183 Chronic kidney disease, stage 3 (moderate): Secondary | ICD-10-CM | POA: Diagnosis not present

## 2018-11-12 DIAGNOSIS — E1122 Type 2 diabetes mellitus with diabetic chronic kidney disease: Secondary | ICD-10-CM | POA: Diagnosis not present

## 2018-11-12 DIAGNOSIS — D631 Anemia in chronic kidney disease: Secondary | ICD-10-CM | POA: Diagnosis not present

## 2018-11-12 DIAGNOSIS — E782 Mixed hyperlipidemia: Secondary | ICD-10-CM | POA: Diagnosis not present

## 2018-11-12 DIAGNOSIS — G9009 Other idiopathic peripheral autonomic neuropathy: Secondary | ICD-10-CM | POA: Diagnosis not present

## 2018-11-18 DIAGNOSIS — E1140 Type 2 diabetes mellitus with diabetic neuropathy, unspecified: Secondary | ICD-10-CM | POA: Diagnosis not present

## 2018-11-18 DIAGNOSIS — L600 Ingrowing nail: Secondary | ICD-10-CM | POA: Diagnosis not present

## 2018-11-18 DIAGNOSIS — L110 Acquired keratosis follicularis: Secondary | ICD-10-CM | POA: Diagnosis not present

## 2018-11-18 DIAGNOSIS — M79671 Pain in right foot: Secondary | ICD-10-CM | POA: Diagnosis not present

## 2018-11-18 DIAGNOSIS — M79672 Pain in left foot: Secondary | ICD-10-CM | POA: Diagnosis not present

## 2018-11-23 ENCOUNTER — Ambulatory Visit: Payer: Medicare Other | Admitting: Adult Health

## 2018-11-30 ENCOUNTER — Ambulatory Visit: Payer: Medicare Other | Admitting: Neurology

## 2019-01-11 ENCOUNTER — Telehealth: Payer: Self-pay | Admitting: Neurology

## 2019-01-11 NOTE — Telephone Encounter (Signed)
Pt has consented to a Tele Visit and for the insurance to be billed as such.

## 2019-01-13 ENCOUNTER — Encounter: Payer: Self-pay | Admitting: Neurology

## 2019-01-13 NOTE — Telephone Encounter (Signed)
Chart updated.  Pt needs refill on the propranolol.

## 2019-01-14 ENCOUNTER — Other Ambulatory Visit: Payer: Self-pay

## 2019-01-14 ENCOUNTER — Ambulatory Visit (INDEPENDENT_AMBULATORY_CARE_PROVIDER_SITE_OTHER): Payer: Medicare Other | Admitting: Neurology

## 2019-01-14 DIAGNOSIS — G629 Polyneuropathy, unspecified: Secondary | ICD-10-CM

## 2019-01-14 DIAGNOSIS — G3184 Mild cognitive impairment, so stated: Secondary | ICD-10-CM | POA: Diagnosis not present

## 2019-01-14 DIAGNOSIS — E538 Deficiency of other specified B group vitamins: Secondary | ICD-10-CM | POA: Diagnosis not present

## 2019-01-14 DIAGNOSIS — G250 Essential tremor: Secondary | ICD-10-CM

## 2019-01-14 MED ORDER — PROPRANOLOL HCL ER 60 MG PO CP24: 60.0000 mg | ORAL_CAPSULE | Freq: Every day | ORAL | 3 refills | Status: DC

## 2019-01-14 MED ORDER — GABAPENTIN 300 MG PO CAPS: 300.0000 mg | ORAL_CAPSULE | Freq: Three times a day (TID) | ORAL | 11 refills | Status: DC | PRN

## 2019-01-14 NOTE — Progress Notes (Signed)
[facility]  Provider:  Dr Jaynee Eagles Referring Provider: Celene Squibb, MD Primary Care Physician:  Wende Neighbors, MD  CC: tremor  Virtual Visit via Telephone Note  I connected with [name] on 01/16/19 at  8:30 AM EDT by telephone and verified that I am speaking with the correct person using two identifiers. Patient at home. Physician in home office.   I discussed the limitations, risks, security and privacy concerns of performing an evaluation and management service by telephone and the availability of in person appointments. I also discussed with the patient that there may be a patient responsible charge related to this service. The patient expressed understanding and agreed to proceed.  Interval History 4/23: He takes 58m propranolol once a day and he is happy but gets tremulous later in the day.We discussed it is a 2-3x a day medication but he doesn;t want to take medication multiple times a day. We can change him to ER 653mand see if that helps for his tremor. His memory is stable.  He gets shakes later. Advised he could take 406mwice daily he prefers 49m8m. Stop IR. His feet hurt, he is not taking the gabapentin. Explained this helps with tremor as well. Discussed side effects.  Interval history 11/17/2017: Patient is here for follow up of tremor on propranolol as well as memory changes. He did not tolerate Aricept. He does well on propranolol.  Also neuropathy is stable. He has been sick, congestion and cough and in his lungs. He is on prednisone, he is feeling a little better. Propranolol is helping. Memory is stable. He has to take 40mg69me days because he shales so much, no side effects if he take 40mg 46mtimes a day, we can increase his dosage to 40mg a62me understands he can cut it in half if needed. His neuropathy is stable. His memory is stable. His tremor is a little worse, will increase propranolol and reviewed side effects and when he should stop.  Memory is stable. Taking B12.   Interval history 05/19/2017: Propranolol is still helping. He feels his memory is stable.  He takes his own medications and doesn;t miss any, he pays his bills every month and not missing pills or double paying, no difficulties driving, no accidents in the home. Not getting lost. He knows the date every day, he reads the newspaper every day, he walks, he socializes with his family sees family every day. He is still taking B12 supplement. He worries about things, he worries about mowing the yard because he runs out of time. He works on Wednesday and [employer] is stable in his feet, has burning which is chronic and stable. Recommend daily ASA for stroke prevention. He hydrates well. He sometimes feels lightheaded for the last 2-3 months, takes propranolol 3x a day recommend maybe decreasing to twice a day and if symptoms of lightheadedness/dizzy get worse call us. He [place] had hypoglycemia and was seen in the ED and this could be the problem as well.   Interval history for tremor 11/04/2016: Patient is here for follow up on tremor on propranolol. He is fine taking the propranolol 3x a day. He can take it up to 4x a day as needed. Memory is stable. Didn't tolerate the Aricept. Discussed the tremor and decided to increase as tolerated and needed. Discussed other medications other than aricept but he declines we will just   Interval history 05/06/2016:Khayden C Griffee [age]Magallanes10 y[age]o.82  male here as a referral from Dr. [place] Crane for tremor. PMHx CKD, Type 2 DM, HLD, HTN, insomnia, OSA, depression, polyneuropathy, cerebral ischemia, b12 deficiency, mantle cell lymphoma, anxiety, parkinson's diseas (?). He goes by the name [name]. Had side effects with primidone and neurontin. He has been taking 30m propranolol three times a day. It has helped with the tremor and blood pressure has not been low and no problems taking the medication 3x a day.   MRI of the brain was  unremarkable for age: IMPRESSION: Generalized cortical atrophy and chronic microvascular ischemicchanges, typical for age. No acute abnormality.   Interval history 01/22/2016: primidone and neurontin had side effects. Discussed propranolol and that this is heart medication, we need to be very mindful of his pulse and blood pressure and this can cause significant decreases in both, he should take his blood pressure and pulse daily, do not take if BP less than 11/60 or pulse < 60, stop for anything concerning whatsoever including SOB, CP. Discussed his memory, He forgetting conversation, things wife tells you, people's names, no dementia in the family. Been going on slowly progressive for at least 2 years. He takes b12 shots. He used to have a cpap and stopped. He is tired during the day. He snores, his wife doesn't. Last sleep study was 4-5 years ago. He is Morbidly obese. Epworth sleep scale only 5/10, doesn't appear to be candidate for sleep stufy. Discussed neurocognitive testing as well.  HPI: Brandon BELLAMYis a 84y.o. male here as a referral from Dr. [place] Cranefor tremor. PMHx CKD, Type 2 DM, HLD, HTN, insomnia, OSA, depression, polyneuropathy, cerebral ischemia, b12 deficiency, mantle cell lymphoma, anxiety, parkinson's diseas (?). He goes by the name COtto Herb Tremor has been going on for over a year. Worsening. Some days he doesn't have it. Today is a good day. When he has it, it is continuous, both resting and postural. He is weak all over, he has pain all over, he went to physical therapy and improved. He has been taking carbidopa/levodopa and it has not helped the tremor, [place] than year. Getting worse. He has a difficult time writing mostly. No difficulty eating, denies excessive coffee. Nothing makes it better or unknown. Grandmother had a tremor, she had it severely and also her head shook and she shook all over. Sister has a similar tremor.  He has trouble turning pages due to the tremor. Slowly progressive. No other focal neurologic deficits.   Review of outside records; HgbA1c 6.5, cmp 08/24/2015 creatinine 1.28 bun 18 gfr 54 Cbc with 3.2 wbc and anemia 11.4/36.7  Review of Systems: Patient complains of symptoms per HPI as well as the following symptoms: weight gain, easy bruising, easy beeding, SOB, cough, swelling in legs, ringing in ears, cramps, aching musckes, allergies, runnynose, weaknes, decrease energy, change in appetite, restless legs. Pertinent negatives per HPI. All others negative.   Social History   Socioeconomic History  . Marital status: Married    Spouse name: DButch Penny . Number of children: 2  . Years of education: 9th grade  . Highest education level: Not on file  Occupational History  . Occupation: Retired     [employer] farm manager RETIRED  Social Needs  . Financial resource strain: Not on file  . Food insecurity:    Worry: Not on file    Inability: Not on file  . Transportation needs:    Medical: Not on file  Non-medical: Not on file  Tobacco Use  . Smoking status: Never Smoker  . Smokeless tobacco: Never Used  Substance and Sexual Activity  . Alcohol use: No  . Drug use: No  . Sexual activity: Yes    Birth control/protection: None  Lifestyle  . Physical activity:    Days per week: Not on file    Minutes per session: Not on file  . Stress: Not on file  Relationships  . Social connections:    Talks on phone: Not on file    Gets together: Not on file    Attends religious service: Not on file    Active member of club or organization: Not on file    Attends meetings of clubs or organizations: Not on file    Relationship status: Not on file  . Intimate partner violence:    Fear of current or ex partner: Not on file    Emotionally abused: Not on file    Physically abused: Not on file    Forced sexual activity: Not on file  Other Topics Concern  . Not on file  Social History Narrative    Lives with wife   Caffeine use: coffee 1 cup some days of the week   Right-handed    Family History  Problem Relation Age of Onset  . Colon cancer Brother   . Cancer Brother   . Diabetes Father   . Cancer Brother     Past Medical History:  Diagnosis Date  . B12 deficiency 02/07/2014  . Colon cancer ([place])   . 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 Essential hypertension, benign   . GERD (gastroesophageal reflux disease)   . Mixed hyperlipidemia   . Obstructive sleep apnea    does not use  . Prostate cancer ([place])   . PVD (peripheral vascular disease) ([place])   . Type 2 diabetes mellitus ([place])     Past Surgical History:  Procedure Laterality Date  . BACK SURGERY    . BALLOON DILATION N/A 08/27/2013   Procedure: BALLOON DILATION;  Surgeon: Rogene Houston, MD;  [facility];  Service: Endoscopy;  Laterality: N/A;  . COLON SURGERY    . COLONOSCOPY N/A 12/17/2013   Procedure: COLONOSCOPY;  Surgeon: Rogene Houston, MD;  [facility];  Service: Endoscopy;  Laterality: N/A;  940  . COLONOSCOPY N/A 02/28/2014   Procedure: COLONOSCOPY;  Surgeon: Rogene Houston, MD;  [facility];  Service: Endoscopy;  Laterality: N/A;  730  . COLONOSCOPY N/A 08/04/2015   Procedure: COLONOSCOPY;  Surgeon: Rogene Houston, MD;  [facility];  Service: Endoscopy;  Laterality: N/A;  200 - moved to 11/11 @ 2:10 - Ann to notify pt  . COLONOSCOPY WITH ESOPHAGOGASTRODUODENOSCOPY (EGD) N/A 08/27/2013   Procedure: COLONOSCOPY WITH ESOPHAGOGASTRODUODENOSCOPY (EGD);  Surgeon: Rogene Houston, MD;  [facility];  Service: Endoscopy;  Laterality: N/A;  730  . IR REMOVAL TUN ACCESS W/ PORT W/O FL MOD SED  07/17/2018  . KNEE ARTHROSCOPY Left   . KNEE SURGERY Right    total knee  . MALONEY DILATION N/A 08/27/2013   Procedure: Venia Minks DILATION;  Surgeon: Rogene Houston, MD;   [facility];  Service: Endoscopy;  Laterality: N/A;  . PORTACATH PLACEMENT Right 2014  . PROSTATECTOMY    . removal of port Right   . SAVORY  DILATION N/A 08/27/2013   Procedure: SAVORY DILATION;  Surgeon: Rogene Houston, MD;  [facility];  Service: Endoscopy;  Laterality: N/A;  . SHOULDER SURGERY    . TONSILLECTOMY    . VASECTOMY      Current Outpatient Medications  Medication Sig Dispense Refill  . acetaminophen (TYLENOL) 325 MG tablet Take 650 mg by mouth every 4 (four) hours as needed for mild pain or moderate pain. Take 2 tablets 1 hour prior to Rituxan treatment.    [name] albuterol (PROAIR HFA) 108 (90 Base) MCG/ACT inhaler Inhale 2 puffs into the lungs at bedtime as needed for wheezing or shortness of breath.     [name] amLODipine (NORVASC) 5 MG tablet TAKE ONE TABLET BY MOUTH ONCE DAILY. 90 tablet 0  . atorvastatin (LIPITOR) 20 MG tablet Take 20 mg by mouth daily.    . budesonide-formoterol (SYMBICORT) 160-4.5 MCG/ACT inhaler Inhale 2 puffs into the lungs 2 (two) times daily.    . Cyanocobalamin 1000 MCG/ML KIT Inject 1 mL as directed every 30 (thirty) days.    . diazepam (VALIUM) 5 MG tablet Take 5 mg by mouth at bedtime as needed (sleep).    . docusate sodium (COLACE) 100 MG capsule Take 200 mg by mouth daily as needed for mild constipation.    . fluticasone (FLONASE) 50 MCG/ACT nasal spray Place 1 spray into both nostrils as needed for allergies or rhinitis.    [name] gabapentin (NEURONTIN) 300 MG capsule Take 1 capsule (300 mg total) by mouth 3 (three) times daily as needed. For pain in the feet. 90 capsule 11  . glipiZIDE (GLUCOTROL) 10 MG tablet Take 10 mg by mouth 2 (two) times daily before a meal.     . hydrocortisone 2.5 % cream Apply 1 application topically as needed (itching).    . insulin lispro (HUMALOG) 100 UNIT/ML cartridge Inject 1-10 Units into the skin as directed. On sliding scale when eating a big meal     . ipratropium (ATROVENT) 0.06 % nasal spray  Place 2 sprays into both nostrils 2 (two) times daily.     [name] levocetirizine (XYZAL) 5 MG tablet Take 5 mg by mouth every evening.     . lidocaine-prilocaine (EMLA) cream Apply quarter size amount over port site one hour prior to appointment and cover with plastic wrap. 30 g 2  . magnesium gluconate (MAGONATE) 500 MG tablet Take 500 mg by mouth daily.    . meclizine (ANTIVERT) 25 MG tablet Take 25 mg by mouth 2 (two) times daily as needed for dizziness.     [name] METRONIDAZOLE, TOPICAL, 0.75 % LOTN Uses prn. Hasn't used in a long time as of 04/20/18.    [name] ondansetron (ZOFRAN) 4 MG tablet Take by mouth as needed.     . pantoprazole (PROTONIX) 40 MG tablet TAKE 1 TABLET BY MOUTH 2 TIMES DAILY BEFORE A MEAL. 60 tablet 5  . propranolol ER (INDERAL LA) 60 MG 24 hr capsule Take 1 capsule (60 mg total) by mouth at bedtime. For tremor. 90 capsule 3  . testosterone cypionate (DEPOTESTOSTERONE CYPIONATE) 200 MG/ML injection Inject 200 mg into the muscle every 14 (fourteen) days.     [name] tobramycin (TOBREX) 0.3 % ophthalmic solution Place 1 drop into both eyes daily as needed.     Nelva Nay SOLOSTAR 300 UNIT/ML SOPN Inject 60 Units into the skin every morning.      No current facility-administered medications for this visit.    Facility-Administered Medications Ordered in  Other Visits  Medication Dose Route Frequency Provider Last Rate Last Dose  . heparin lock flush 100 unit/mL  500 Units Intravenous Once Kefalas, Thomas S, PA-C      . sodium chloride flush (NS) 0.9 % injection 20 mL  20 mL Intravenous PRN Baird Cancer, PA-C        Allergies as of 01/14/2019 - Review Complete 01/13/2019  Allergen Reaction Noted  . Bee venom Anaphylaxis   . Adhesive [tape] Hives   . Ciprofloxacin    . Codeine    . Iodine    . Latex    . Povidone-iodine  10/15/2010  . Simvastatin      Vitals: There were no vitals taken for this visit. Last Weight:  Wt Readings from Last 1 Encounters:  08/31/18 239 lb 6.4 oz  (108.6 kg)   Last Height:   Ht Readings from Last 1 Encounters:  08/31/18 _0  (1.803 m)   MMSE - Mini Mental State Exam 05/19/2017 11/04/2016  Orientation to time 5 5  Orientation to Place 5 5  Registration 3 3  Attention/ Calculation 0 1  Recall 3 1  Language- name 2 objects 2 2  Language- repeat 1 1  Language- follow 3 step command 3 3  Language- read & follow direction 1 1  Write a sentence 0 1  Copy design 1 1  Total score 24 24   PRIOR EXAM:   Cranial Nerves:  The pupils are equal, round, and reactive to light. Visual fields are full to finger confrontation. Extraocular movements are intact. Trigeminal sensation is intact and the muscles of mastication are normal. The face is symmetric. The palate elevates in the midline. Hearing intact. Voice is normal. Shoulder shrug is normal. The tongue has normal motion without fasciculations.   Coordination:  Normal finger to nose and heel to shin.   Gait:  Mildly wide based  Motor Observation:  No asymmetry, no atrophy, High frequency low amplitude tremor with postural with action components stable today Tone:  Normal muscle tone. No cogwheeling. Not increased.   Posture:  Posture is normal. normal erect   Strength:  Strength is V/V in the upper and lower limbs.    Sensation: dec pin prick, temp to the knees. Absent vibration at the toes.    Reflex Exam:  DTR's:  Deep tendon reflexes in the upper and lower extremities are hypo bilaterally.  Toes:  The toes are downgoing bilaterally.  Clonus:  Clonus is absent.    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 Grandmother and sister have tremor. He has a low amplitude, high frequency tremor which is treated with propranolol. I don't see any signs of parkinsonism, he does not shuffle, tone is normal, no resting tremor, not bradykinetic. I  think this is an essential tremor. He was given sinemet from another doctor which didn't help because I don't think he has PD. Had side effects to primidone and neurontin. On propranolol and discussed risks in the elderly, hypotension and bradycardia, discussed it at length and he wants to continue. Also neuropathy in the feet.  - Tremor improved on propranolol 58m a day OR but gets tremulous later in the day, doesn't want to take med multiple times a day. Change to ER 643m  - Neurontin:  For neuropathy. Also helps with tremor. Restart.  - Memory loss:Labs and MRI of the brain - unremarkable. Did not tolerate Aricept. MMSE  last stable 24/30 last time and declines testing today OTP, mild cognitive impairment. He takes vitamin D daily and B12 shots, continue. Epworth sleep scale 5/10, doesn't appear to need a sleep eval for OSA. Discussed Memantine today. He declines any new medication again. He feels stable.   - recommend a daily ASA for stroke prevention, also manageing stroke vascular risk factors such as cholesterol, BP, diabetes, diet, weight.  Discussed with him, stroke prevention.   MRI brain: IMPRESSION: Generalized cortical atrophy and chronic microvascular ischemic changes, typical for age. No acute abnormality.  yearlly f/u  Meds ordered this encounter  Medications  . propranolol ER (INDERAL LA) 60 MG 24 hr capsule    Sig: Take 1 capsule (60 mg total) by mouth at bedtime. For tremor.    Dispense:  90 capsule    Refill:  3  . gabapentin (NEURONTIN) 300 MG capsule    Sig: Take 1 capsule (300 mg total) by mouth 3 (three) times daily as needed. For pain in the feet.    Dispense:  90 capsule    Refill:  11   Follow Up Instructions:    I discussed the assessment and treatment plan with the patient. The patient was provided an opportunity to ask questions and all were answered. The patient agreed with the plan and demonstrated an understanding of the instructions.   The patient  was advised to call back or seek an in-person evaluation if the symptoms worsen or if the condition fails to improve as anticipated.  I provided 24 minutes of non-face-to-face time during this encounter.   Melvenia Beam, MD .  Sarina Ill, MD  [facility] [address] [place], [place] [address]  Phone [phone] Fax [phone]

## 2019-01-16 DIAGNOSIS — G629 Polyneuropathy, unspecified: Secondary | ICD-10-CM | POA: Insufficient documentation

## 2019-01-25 DIAGNOSIS — Z0000 Encounter for general adult medical examination without abnormal findings: Secondary | ICD-10-CM | POA: Diagnosis not present

## 2019-02-03 DIAGNOSIS — L110 Acquired keratosis follicularis: Secondary | ICD-10-CM | POA: Diagnosis not present

## 2019-02-03 DIAGNOSIS — M79672 Pain in left foot: Secondary | ICD-10-CM | POA: Diagnosis not present

## 2019-02-03 DIAGNOSIS — E1140 Type 2 diabetes mellitus with diabetic neuropathy, unspecified: Secondary | ICD-10-CM | POA: Diagnosis not present

## 2019-02-03 DIAGNOSIS — M79671 Pain in right foot: Secondary | ICD-10-CM | POA: Diagnosis not present

## 2019-02-03 DIAGNOSIS — L600 Ingrowing nail: Secondary | ICD-10-CM | POA: Diagnosis not present

## 2019-02-10 DIAGNOSIS — M79672 Pain in left foot: Secondary | ICD-10-CM | POA: Diagnosis not present

## 2019-02-10 DIAGNOSIS — E1140 Type 2 diabetes mellitus with diabetic neuropathy, unspecified: Secondary | ICD-10-CM | POA: Diagnosis not present

## 2019-02-10 DIAGNOSIS — L600 Ingrowing nail: Secondary | ICD-10-CM | POA: Diagnosis not present

## 2019-02-10 DIAGNOSIS — M79671 Pain in right foot: Secondary | ICD-10-CM | POA: Diagnosis not present

## 2019-02-10 DIAGNOSIS — L110 Acquired keratosis follicularis: Secondary | ICD-10-CM | POA: Diagnosis not present

## 2019-02-18 DIAGNOSIS — E1140 Type 2 diabetes mellitus with diabetic neuropathy, unspecified: Secondary | ICD-10-CM | POA: Diagnosis not present

## 2019-02-18 DIAGNOSIS — D631 Anemia in chronic kidney disease: Secondary | ICD-10-CM | POA: Diagnosis not present

## 2019-02-18 DIAGNOSIS — D519 Vitamin B12 deficiency anemia, unspecified: Secondary | ICD-10-CM | POA: Diagnosis not present

## 2019-02-18 DIAGNOSIS — E1122 Type 2 diabetes mellitus with diabetic chronic kidney disease: Secondary | ICD-10-CM | POA: Diagnosis not present

## 2019-03-01 DIAGNOSIS — E1122 Type 2 diabetes mellitus with diabetic chronic kidney disease: Secondary | ICD-10-CM | POA: Diagnosis not present

## 2019-03-01 DIAGNOSIS — N183 Chronic kidney disease, stage 3 (moderate): Secondary | ICD-10-CM | POA: Diagnosis not present

## 2019-03-01 DIAGNOSIS — E782 Mixed hyperlipidemia: Secondary | ICD-10-CM | POA: Diagnosis not present

## 2019-03-01 DIAGNOSIS — G9009 Other idiopathic peripheral autonomic neuropathy: Secondary | ICD-10-CM | POA: Diagnosis not present

## 2019-03-04 ENCOUNTER — Ambulatory Visit (INDEPENDENT_AMBULATORY_CARE_PROVIDER_SITE_OTHER): Payer: Medicare Other | Admitting: Otolaryngology

## 2019-03-04 DIAGNOSIS — J310 Chronic rhinitis: Secondary | ICD-10-CM | POA: Diagnosis not present

## 2019-03-04 DIAGNOSIS — M25579 Pain in unspecified ankle and joints of unspecified foot: Secondary | ICD-10-CM | POA: Diagnosis not present

## 2019-03-04 DIAGNOSIS — M79672 Pain in left foot: Secondary | ICD-10-CM | POA: Diagnosis not present

## 2019-03-04 DIAGNOSIS — M79671 Pain in right foot: Secondary | ICD-10-CM | POA: Diagnosis not present

## 2019-03-15 DIAGNOSIS — L738 Other specified follicular disorders: Secondary | ICD-10-CM | POA: Diagnosis not present

## 2019-03-15 DIAGNOSIS — L821 Other seborrheic keratosis: Secondary | ICD-10-CM | POA: Diagnosis not present

## 2019-03-15 DIAGNOSIS — C44519 Basal cell carcinoma of skin of other part of trunk: Secondary | ICD-10-CM | POA: Diagnosis not present

## 2019-03-15 DIAGNOSIS — Z85828 Personal history of other malignant neoplasm of skin: Secondary | ICD-10-CM | POA: Diagnosis not present

## 2019-03-15 DIAGNOSIS — D485 Neoplasm of uncertain behavior of skin: Secondary | ICD-10-CM | POA: Diagnosis not present

## 2019-03-15 DIAGNOSIS — L570 Actinic keratosis: Secondary | ICD-10-CM | POA: Diagnosis not present

## 2019-03-25 ENCOUNTER — Other Ambulatory Visit (INDEPENDENT_AMBULATORY_CARE_PROVIDER_SITE_OTHER): Payer: Self-pay | Admitting: Internal Medicine

## 2019-03-25 DIAGNOSIS — K219 Gastro-esophageal reflux disease without esophagitis: Secondary | ICD-10-CM

## 2019-04-05 DIAGNOSIS — M79672 Pain in left foot: Secondary | ICD-10-CM | POA: Diagnosis not present

## 2019-04-05 DIAGNOSIS — M79671 Pain in right foot: Secondary | ICD-10-CM | POA: Diagnosis not present

## 2019-04-05 DIAGNOSIS — M25579 Pain in unspecified ankle and joints of unspecified foot: Secondary | ICD-10-CM | POA: Diagnosis not present

## 2019-04-19 DIAGNOSIS — M79672 Pain in left foot: Secondary | ICD-10-CM | POA: Diagnosis not present

## 2019-04-19 DIAGNOSIS — M79671 Pain in right foot: Secondary | ICD-10-CM | POA: Diagnosis not present

## 2019-04-19 DIAGNOSIS — L600 Ingrowing nail: Secondary | ICD-10-CM | POA: Diagnosis not present

## 2019-04-19 DIAGNOSIS — L110 Acquired keratosis follicularis: Secondary | ICD-10-CM | POA: Diagnosis not present

## 2019-04-19 DIAGNOSIS — E1140 Type 2 diabetes mellitus with diabetic neuropathy, unspecified: Secondary | ICD-10-CM | POA: Diagnosis not present

## 2019-04-26 DIAGNOSIS — M79672 Pain in left foot: Secondary | ICD-10-CM | POA: Diagnosis not present

## 2019-04-26 DIAGNOSIS — M25579 Pain in unspecified ankle and joints of unspecified foot: Secondary | ICD-10-CM | POA: Diagnosis not present

## 2019-04-26 DIAGNOSIS — M79671 Pain in right foot: Secondary | ICD-10-CM | POA: Diagnosis not present

## 2019-05-24 DIAGNOSIS — M79672 Pain in left foot: Secondary | ICD-10-CM | POA: Diagnosis not present

## 2019-05-24 DIAGNOSIS — M79671 Pain in right foot: Secondary | ICD-10-CM | POA: Diagnosis not present

## 2019-05-24 DIAGNOSIS — M25579 Pain in unspecified ankle and joints of unspecified foot: Secondary | ICD-10-CM | POA: Diagnosis not present

## 2019-05-25 DIAGNOSIS — R319 Hematuria, unspecified: Secondary | ICD-10-CM | POA: Diagnosis not present

## 2019-05-27 DIAGNOSIS — R310 Gross hematuria: Secondary | ICD-10-CM | POA: Diagnosis not present

## 2019-05-28 DIAGNOSIS — Z87440 Personal history of urinary (tract) infections: Secondary | ICD-10-CM | POA: Diagnosis not present

## 2019-06-02 DIAGNOSIS — M545 Low back pain: Secondary | ICD-10-CM | POA: Diagnosis not present

## 2019-07-05 DIAGNOSIS — D519 Vitamin B12 deficiency anemia, unspecified: Secondary | ICD-10-CM | POA: Diagnosis not present

## 2019-07-05 DIAGNOSIS — D631 Anemia in chronic kidney disease: Secondary | ICD-10-CM | POA: Diagnosis not present

## 2019-07-05 DIAGNOSIS — E782 Mixed hyperlipidemia: Secondary | ICD-10-CM | POA: Diagnosis not present

## 2019-07-05 DIAGNOSIS — E1122 Type 2 diabetes mellitus with diabetic chronic kidney disease: Secondary | ICD-10-CM | POA: Diagnosis not present

## 2019-07-05 DIAGNOSIS — E1140 Type 2 diabetes mellitus with diabetic neuropathy, unspecified: Secondary | ICD-10-CM | POA: Diagnosis not present

## 2019-07-06 ENCOUNTER — Ambulatory Visit (INDEPENDENT_AMBULATORY_CARE_PROVIDER_SITE_OTHER): Payer: Medicare Other | Admitting: Internal Medicine

## 2019-07-06 ENCOUNTER — Encounter (INDEPENDENT_AMBULATORY_CARE_PROVIDER_SITE_OTHER): Payer: Self-pay | Admitting: Internal Medicine

## 2019-07-06 ENCOUNTER — Other Ambulatory Visit: Payer: Self-pay

## 2019-07-06 ENCOUNTER — Encounter: Payer: Self-pay | Admitting: *Deleted

## 2019-07-06 VITALS — BP 134/78 | HR 80 | Temp 97.8°F | Ht 71.0 in | Wt 225.6 lb

## 2019-07-06 DIAGNOSIS — K219 Gastro-esophageal reflux disease without esophagitis: Secondary | ICD-10-CM | POA: Diagnosis not present

## 2019-07-06 DIAGNOSIS — C8310 Mantle cell lymphoma, unspecified site: Secondary | ICD-10-CM

## 2019-07-06 DIAGNOSIS — K8020 Calculus of gallbladder without cholecystitis without obstruction: Secondary | ICD-10-CM | POA: Diagnosis not present

## 2019-07-06 DIAGNOSIS — Z8719 Personal history of other diseases of the digestive system: Secondary | ICD-10-CM | POA: Diagnosis not present

## 2019-07-06 NOTE — Patient Instructions (Addendum)
Notify if you have rectal bleeding or right upper quadrant epigastric or infrascapular pain.

## 2019-07-06 NOTE — Progress Notes (Signed)
Presenting complaint;  Follow for chronic GERD. History of ulcerative colitis and mantle cell lymphoma of the colon.  Database and subjective:  Patient is 84 year old Caucasian male who is here for yearly visit.  He was last seen 1 year ago.  He has chronic GERD and has required double dose PPI.  He also has a history of distal ulcerative colitis and mantle cell lymphoma.  And has been in remission off therapy for a number of years.  His last colonoscopy was in October 2016 revealing UC and mantle cell lymphoma to be in remission. He was also found to have gallstones on imaging studies but has never had any symptoms. He also has a history of prostate carcinoma.  Patient states he is doing well.  He rarely has heartburn.  He is not having any side effects with pantoprazole.  Last time he drop dose to 40 mg daily and develop chest pain and heartburn within 3 to 4 weeks.  He states it took him 3 days on double dose for symptoms to be controlled again.  He denies dysphagia nausea or vomiting.  He is not having any side effects with PPI. He generally has 2 bowel movements per day.  Lately he has been taking more stool softener.  He denies melena or rectal bleeding.  He has occasional pain in left lower quadrant which actually has decreased in severity and frequency compared to few years ago. About 1 month ago he was unable to void.  He saw Dr. [name] and was diagnosed with urinary tract infection and treated with 7 days of antibiotic.  He does not remember which antibiotic.  He has chronic nocturia and increased frequency of urine.  He generally voids every 2 hours.  Occasionally he can go 3 hours during the daytime. He has artificial urinary sphincter in place which she has to deflate every time he has to void.  Patient said he had blood work at Dr. Juel Burrow office last week but does not know the results.  He has a history of chronic kidney insufficiency.   Current Medications: Outpatient Encounter  Medications as of 07/06/2019  Medication Sig  . acetaminophen (TYLENOL) 325 MG tablet Take 650 mg by mouth every 4 (four) hours as needed for mild pain or moderate pain. Take 2 tablets 1 hour prior to Rituxan treatment.  [name] albuterol (PROAIR HFA) 108 (90 Base) MCG/ACT inhaler Inhale 2 puffs into the lungs at bedtime as needed for wheezing or shortness of breath.   [name] amLODipine (NORVASC) 5 MG tablet TAKE ONE TABLET BY MOUTH ONCE DAILY.  [name] atorvastatin (LIPITOR) 20 MG tablet Take 20 mg by mouth daily.  . budesonide-formoterol (SYMBICORT) 160-4.5 MCG/ACT inhaler Inhale 2 puffs into the lungs 2 (two) times daily.  . Cyanocobalamin 1000 MCG/ML KIT Inject 1 mL as directed every 30 (thirty) days.  . diazepam (VALIUM) 5 MG tablet Take 5 mg by mouth at bedtime as needed (sleep).  . docusate sodium (COLACE) 100 MG capsule Take 200 mg by mouth daily as needed for mild constipation.  . fluticasone (FLONASE) 50 MCG/ACT nasal spray Place 1 spray into both nostrils as needed for allergies or rhinitis.  [name] gabapentin (NEURONTIN) 300 MG capsule Take 1 capsule (300 mg total) by mouth 3 (three) times daily as needed. For pain in the feet.  [name] glipiZIDE (GLUCOTROL) 10 MG tablet Take 10 mg by mouth 2 (two) times daily before a meal.   . hydrocortisone 2.5 % cream Apply 1 application topically as  needed (itching).  . insulin lispro (HUMALOG) 100 UNIT/ML cartridge Inject 1-10 Units into the skin as directed. On sliding scale when eating a big meal   . ipratropium (ATROVENT) 0.06 % nasal spray Place 2 sprays into both nostrils 2 (two) times daily.   [name] levocetirizine (XYZAL) 5 MG tablet Take 5 mg by mouth every evening.   . magnesium gluconate (MAGONATE) 500 MG tablet Take 500 mg by mouth daily.  . meclizine (ANTIVERT) 25 MG tablet Take 25 mg by mouth 2 (two) times daily as needed for dizziness.   . ondansetron (ZOFRAN) 4 MG tablet Take by mouth as needed.   . pantoprazole (PROTONIX) 40 MG tablet TAKE 1 TABLET BY MOUTH 2  TIMES DAILY BEFORE A MEAL.  [name] propranolol ER (INDERAL LA) 60 MG 24 hr capsule Take 1 capsule (60 mg total) by mouth at bedtime. For tremor.  [name] testosterone cypionate (DEPOTESTOSTERONE CYPIONATE) 200 MG/ML injection Inject 200 mg into the muscle every 14 (fourteen) days.   Nelva Nay SOLOSTAR 300 UNIT/ML SOPN Inject 70 Units into the skin every morning.   . tobramycin (TOBREX) 0.3 % ophthalmic solution Place 1 drop into both eyes daily as needed.   . [DISCONTINUED] insulin aspart (NOVOLOG) 100 UNIT/ML injection Inject 20 Units into the skin 3 (three) times daily before meals.    . [DISCONTINUED] lidocaine-prilocaine (EMLA) cream Apply quarter size amount over port site one hour prior to appointment and cover with plastic wrap. (Patient not taking: Reported on 07/06/2019)  . [DISCONTINUED] METRONIDAZOLE, TOPICAL, 0.75 % LOTN Uses prn. Hasn't used in a long time as of 04/20/18.  . [DISCONTINUED] rosuvastatin (CRESTOR) 40 MG tablet Take 40 mg by mouth daily.     Facility-Administered Encounter Medications as of 07/06/2019  Medication  . heparin lock flush 100 unit/mL  . sodium chloride flush (NS) 0.9 % injection 20 mL     Objective: Blood pressure 134/78, pulse 80, temperature 97.8 F (36.6 C), temperature source Oral, height 5' 11"  (1.803 m), weight 225 lb 9.6 oz (102.3 kg). Patient is alert and in no acute distress. Conjunctiva is pink. Sclera is nonicteric Oropharyngeal mucosa is normal. No neck masses or thyromegaly noted. Cardiac exam with regular rhythm normal S1 and S2. No murmur or gallop noted. Lungs are clear to auscultation. Abdomen is symmetrical.  He has small scar below the right costal margin laterally.  He has scar in left lower quadrant and some fullness in left lower quadrant above the inguinal ligament felt to be due to urinary sphincter device.  On palpation he has mild tenderness at LLQ and epigastrium.  No organomegaly or masses. No LE edema or clubbing  noted.  Labs/studies Results: CT from 08/19/2016 reviewed. Calcified gallstones.  Stones initially seen on study of February 2015.  Assessment:  #1.  Chronic GERD.  He does not have history of Barrett's esophagus.  Last EGD was in December 2014 revealing small ulcer at GE junction. Symptom control not satisfactory on single dose PPI.  He would therefore continue double dose PPI as long as he is not having any side effects.  #2.  History of distal ulcerative colitis.  He has remained in remission off mesalamine.  Last colonoscopy was in November 2016.  #3.  History of mantle cell lymphoma of the colon diagnosed in December 2014 for which he received chemotherapy.  He had follow-up exam in March 2015 as well as an June 2015 and noted to be in in remission.  Most recent colonoscopy was in  November 2016 and he was still in remission.  No surveillance guidelines for this rather rare disease.  #4.  Asymptomatic cholelithiasis.   Plan:  Patient educated as to biliary type of symptoms.  Should he have any he will call office. Continue antireflux measures and pantoprazole at 40 mg p.o. twice daily. He is to get a copy of his recent blood work for review.  If his creatinine is going up may consider dropping PPI dose and adding H2B.  However his kidney disease may be due to diabetes. Patient will call office if he develops diarrhea or rectal bleeding. Office visit in 1 year at which time may consider surveillance colonoscopy.

## 2019-07-09 ENCOUNTER — Other Ambulatory Visit: Payer: Self-pay | Admitting: Nurse Practitioner

## 2019-07-12 DIAGNOSIS — E1140 Type 2 diabetes mellitus with diabetic neuropathy, unspecified: Secondary | ICD-10-CM | POA: Diagnosis not present

## 2019-07-12 DIAGNOSIS — M79672 Pain in left foot: Secondary | ICD-10-CM | POA: Diagnosis not present

## 2019-07-12 DIAGNOSIS — L110 Acquired keratosis follicularis: Secondary | ICD-10-CM | POA: Diagnosis not present

## 2019-07-12 DIAGNOSIS — M79671 Pain in right foot: Secondary | ICD-10-CM | POA: Diagnosis not present

## 2019-07-12 DIAGNOSIS — L600 Ingrowing nail: Secondary | ICD-10-CM | POA: Diagnosis not present

## 2019-07-19 DIAGNOSIS — Z23 Encounter for immunization: Secondary | ICD-10-CM | POA: Diagnosis not present

## 2019-07-19 DIAGNOSIS — E1122 Type 2 diabetes mellitus with diabetic chronic kidney disease: Secondary | ICD-10-CM | POA: Diagnosis not present

## 2019-07-19 DIAGNOSIS — G9009 Other idiopathic peripheral autonomic neuropathy: Secondary | ICD-10-CM | POA: Diagnosis not present

## 2019-07-19 DIAGNOSIS — N1832 Chronic kidney disease, stage 3b: Secondary | ICD-10-CM | POA: Diagnosis not present

## 2019-07-19 DIAGNOSIS — E782 Mixed hyperlipidemia: Secondary | ICD-10-CM | POA: Diagnosis not present

## 2019-08-04 ENCOUNTER — Other Ambulatory Visit: Payer: Self-pay | Admitting: Nurse Practitioner

## 2019-08-25 DIAGNOSIS — I129 Hypertensive chronic kidney disease with stage 1 through stage 4 chronic kidney disease, or unspecified chronic kidney disease: Secondary | ICD-10-CM | POA: Diagnosis not present

## 2019-08-25 DIAGNOSIS — J449 Chronic obstructive pulmonary disease, unspecified: Secondary | ICD-10-CM | POA: Diagnosis not present

## 2019-08-25 DIAGNOSIS — I10 Essential (primary) hypertension: Secondary | ICD-10-CM | POA: Diagnosis not present

## 2019-08-25 DIAGNOSIS — E1122 Type 2 diabetes mellitus with diabetic chronic kidney disease: Secondary | ICD-10-CM | POA: Diagnosis not present

## 2019-08-25 DIAGNOSIS — D631 Anemia in chronic kidney disease: Secondary | ICD-10-CM | POA: Diagnosis not present

## 2019-08-30 ENCOUNTER — Ambulatory Visit: Payer: Medicare Other | Admitting: Nurse Practitioner

## 2019-09-08 NOTE — Progress Notes (Signed)
CARDIOLOGY OFFICE NOTE  Date:  09/13/2019    [name] Date of Birth: October 21, 1934 Medical Record #[id number]  PCP:  Celene Squibb, MD  Cardiologist:  Servando Snare    Chief Complaint  Patient presents with  . Follow-up    History of Present Illness: [name] is a 84 y.o. male who presents today for a one year check. Former patient of Dr. Maren Beach. Last seen in May of 2014 by Dr. Domenic Polite.Basically follows with me.  He has known CAD with no prior history of PCI. Other issues include colon cancer, HTN, GERD, HLD, PVDand OSA. Remote stress test from 2008. Reported echocardiogram from April 2011 noted upper normal LV wall thickness with LVEF 22-29%, grade 2 diastolic dysfunction, trivial aortic regurgitation, mild biatrial enlargement.  I saw himback in October of 2017 after a 3 year absence- he was needing left hand surgery and needing clearance. He hadnot been back here "because nothing is wrong with me". He was off most of his medicines other than Inderal and his med list did not match his PCP's. BP high here and at home. No symptoms. I put him on Norvasc - I was not convinced that he would stay on long term.His history is always a little hard to follow but he has seemed to do ok - last visit in December of 2019 - feeling good - sugars were high - he had no real concerns.   The patient does not have symptoms concerning for COVID-19 infection (fever, chills, cough, or new shortness of breath).   Comes in today. Here alone. He has had a good year. No chest pain. Breathing is good. Still working for the [employer] on Wednesdays. He has been walking. Has lost weight with the pandemic. Not dizzy. Bp is good. He is happy with how he is doing.   Past Medical History:  Diagnosis Date  . B12 deficiency 02/07/2014  . Colon cancer ([place])   . 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 Essential hypertension, benign   . GERD (gastroesophageal reflux disease)   . Mixed hyperlipidemia   . Obstructive sleep apnea    does not use  . Prostate cancer ([place])   . PVD (peripheral vascular disease) ([place])   . Type 2 diabetes mellitus ([place])     Past Surgical History:  Procedure Laterality Date  . BACK SURGERY    . BALLOON DILATION N/A 08/27/2013   Procedure: BALLOON DILATION;  Surgeon: Rogene Houston, MD;  [facility];  Service: Endoscopy;  Laterality: N/A;  . COLON SURGERY    . COLONOSCOPY N/A 12/17/2013   Procedure: COLONOSCOPY;  Surgeon: Rogene Houston, MD;  [facility];  Service: Endoscopy;  Laterality: N/A;  940  . COLONOSCOPY N/A 02/28/2014   Procedure: COLONOSCOPY;  Surgeon: Rogene Houston, MD;  [facility];  Service: Endoscopy;  Laterality: N/A;  730  . COLONOSCOPY N/A 08/04/2015   Procedure: COLONOSCOPY;  Surgeon: Rogene Houston, MD;  [facility];  Service: Endoscopy;  Laterality: N/A;  200 - moved to 11/11 @ 2:10 - Ann to notify pt  . COLONOSCOPY WITH ESOPHAGOGASTRODUODENOSCOPY (EGD) N/A 08/27/2013   Procedure: COLONOSCOPY WITH ESOPHAGOGASTRODUODENOSCOPY (EGD);  Surgeon: Rogene Houston, MD;  [facility];  Service: Endoscopy;  Laterality: N/A;  730  . IR REMOVAL TUN ACCESS W/ PORT W/O  FL MOD SED  07/17/2018  . KNEE ARTHROSCOPY Left   . KNEE SURGERY Right    total knee  . MALONEY DILATION N/A 08/27/2013   Procedure: Venia Minks DILATION;  Surgeon: Rogene Houston, MD;  [facility];  Service: Endoscopy;  Laterality: N/A;  . PORTACATH PLACEMENT Right 2014  . PROSTATECTOMY    . removal of port Right   . SAVORY DILATION N/A 08/27/2013   Procedure: SAVORY DILATION;  Surgeon: Rogene Houston, MD;  [facility];  Service: Endoscopy;  Laterality: N/A;  . SHOULDER SURGERY    . TONSILLECTOMY    . VASECTOMY       Medications: Current Meds  Medication Sig  . acetaminophen (TYLENOL) 325  MG tablet Take 650 mg by mouth every 4 (four) hours as needed for mild pain or moderate pain. Take 2 tablets 1 hour prior to Rituxan treatment.  [name] albuterol (PROAIR HFA) 108 (90 Base) MCG/ACT inhaler Inhale 2 puffs into the lungs at bedtime as needed for wheezing or shortness of breath.   [name] amLODipine (NORVASC) 5 MG tablet TAKE ONE TABLET BY MOUTH ONCE DAILY.  [name] atorvastatin (LIPITOR) 20 MG tablet Take 20 mg by mouth daily.  . budesonide-formoterol (SYMBICORT) 160-4.5 MCG/ACT inhaler Inhale 2 puffs into the lungs 2 (two) times daily.  [name] docusate sodium (COLACE) 100 MG capsule Take 200 mg by mouth daily as needed for mild constipation.  . fluticasone (FLONASE) 50 MCG/ACT nasal spray Place 1 spray into both nostrils as needed for allergies or rhinitis.  [name] gabapentin (NEURONTIN) 300 MG capsule Take 1 capsule (300 mg total) by mouth 3 (three) times daily as needed. For pain in the feet.  [name] glipiZIDE (GLUCOTROL) 10 MG tablet Take 10 mg by mouth 2 (two) times daily before a meal.   . hydrocortisone 2.5 % cream Apply 1 application topically as needed (itching).  . insulin lispro (HUMALOG) 100 UNIT/ML cartridge Inject 1-10 Units into the skin as directed. On sliding scale when eating a big meal   . ipratropium (ATROVENT) 0.06 % nasal spray Place 2 sprays into both nostrils 2 (two) times daily.   . meclizine (ANTIVERT) 25 MG tablet Take 25 mg by mouth 2 (two) times daily as needed for dizziness.   . ondansetron (ZOFRAN) 4 MG tablet Take by mouth as needed.   . pantoprazole (PROTONIX) 40 MG tablet TAKE 1 TABLET BY MOUTH 2 TIMES DAILY BEFORE A MEAL.  [name] propranolol ER (INDERAL LA) 60 MG 24 hr capsule Take 1 capsule (60 mg total) by mouth at bedtime. For tremor.  [name] testosterone cypionate (DEPOTESTOSTERONE CYPIONATE) 200 MG/ML injection Inject 200 mg into the muscle every 14 (fourteen) days.   [name] tobramycin (TOBREX) 0.3 % ophthalmic solution Place 1 drop into both eyes daily as needed.   Nelva Nay SOLOSTAR 300  UNIT/ML SOPN Inject 70 Units into the skin every morning.      Allergies: Allergies  Allergen Reactions  . Bee Venom Anaphylaxis  . Adhesive [Tape] Hives  . Ciprofloxacin     Unknown   . Codeine     Unknown    . Iodine     Unknown    . Latex     Unknown    . Povidone-Iodine     Unknown    . Simvastatin     Unknown      Social History: The patient  reports that he has never smoked. He has never used smokeless tobacco. He reports that he  does not drink alcohol or use drugs.   Family History: The patient's family history includes Cancer in his brother and brother; Colon cancer in his brother; Diabetes in his father.   Review of Systems: Please see the history of present illness.   All other systems are reviewed and negative.   Physical Exam: VS:  BP 126/82   Pulse 76   Ht 5\' 11"  (1.803 m)   Wt 226 lb (102.5 kg)   SpO2 95%   BMI 31.52 kg/m  .  BMI Body mass index is 31.52 kg/m.  Wt Readings from Last 3 Encounters:  09/13/19 226 lb (102.5 kg)  07/06/19 225 lb 9.6 oz (102.3 kg)  08/31/18 239 lb 6.4 oz (108.6 kg)    General: Pleasant. Alert and in no acute distress.  Looks younger than his stated age.  HEENT: Normal.  Neck: Supple, no JVD, carotid bruits, or masses noted.  Cardiac: Regular rate and rhythm. No murmurs, rubs, or gallops. No edema.  Respiratory:  Lungs are clear to auscultation bilaterally with normal work of breathing.  GI: Soft and nontender.  MS: No deformity or atrophy. Gait and ROM intact.  Skin: Warm and dry. Color is normal.  Neuro:  Strength and sensation are intact and no gross focal deficits noted.  Psych: Alert, appropriate and with normal affect.   LABORATORY DATA:  EKG:  EKG is ordered today. This demonstrates NSR - unchanged.  Lab Results  Component Value Date   WBC 8.5 07/17/2018   HGB 16.0 07/17/2018   HCT 50.9 07/17/2018   PLT 137 (L) 07/17/2018   GLUCOSE 230 (H) 02/23/2018   CHOL 195 05/14/2010   TRIG 115.0  05/14/2010   HDL 55.40 05/14/2010   LDLCALC 117 (H) 05/14/2010   ALT 17 02/23/2018   AST 19 02/23/2018   NA 140 02/23/2018   K 4.1 02/23/2018   CL 105 02/23/2018   CREATININE 1.52 (H) 02/23/2018   BUN 31 (H) 02/23/2018   CO2 28 02/23/2018   TSH 2.030 10/17/2015   PSA <0.01 11/28/2016   INR 0.94 07/17/2018       BNP (last 3 results) No results for input(s): BNP in the last 8760 hours.  ProBNP (last 3 results) No results for input(s): PROBNP in the last 8760 hours.   Other Studies Reviewed Today:  CT CHESTIMPRESSION12/2017: No lesion is seen corresponding to the area of concern over the left lower hemithorax seen on the scout tomogram of most recent study. There is currently no chest wall lesion. There is no new parenchymal lung lesion. The largest ground-glass nodular lesions seen on previous study is smaller currently. As this lesion does persist, recommendations from CT examination September 2017 remain in effect. No new nodular opacities. No adenopathy. There is atherosclerotic calcification at multiple sites including multiple foci of coronary artery calcification.   Electronically Signed By: Lowella Grip III M.D. On: 09/09/2016 09:25  Assessment/Plan:  1. CAD - he has no active symptoms. EKG is unchanged. He is doing well with CV risk factor modification.   2. HTN - BP is good - no changes made.   3. HLD - on statin - labs from PCP noted.   4. Past non compliance - does not seem to be an issue.   5. COVID-19 Education: The signs and symptoms of COVID-19 were discussed with the patient and how to seek care for testing (follow up with PCP or arrange E-visit).  The importance of social distancing, staying at home, hand hygiene  and wearing a mask when out in public were discussed today.  Current medicines are reviewed with the patient today.  The patient does not have concerns regarding medicines other than what has been noted above.  The  following changes have been made:  See above.  Labs/ tests ordered today include:    Orders Placed This Encounter  Procedures  . EKG 12-Lead     Disposition:   FU with me in 1 year.   Patient is agreeable to this plan and will call if any problems develop in the interim.   SignedTruitt Merle, NP  09/13/2019 9:38 AM  [facility] [address] [place], [place]  [address] Phone: [phone] Fax: [phone]

## 2019-09-13 ENCOUNTER — Other Ambulatory Visit: Payer: Self-pay

## 2019-09-13 ENCOUNTER — Ambulatory Visit: Payer: Medicare Other | Admitting: Nurse Practitioner

## 2019-09-13 ENCOUNTER — Encounter: Payer: Self-pay | Admitting: Nurse Practitioner

## 2019-09-13 VITALS — BP 126/82 | HR 76 | Ht 71.0 in | Wt 226.0 lb

## 2019-09-13 DIAGNOSIS — I10 Essential (primary) hypertension: Secondary | ICD-10-CM | POA: Diagnosis not present

## 2019-09-13 DIAGNOSIS — I259 Chronic ischemic heart disease, unspecified: Secondary | ICD-10-CM | POA: Diagnosis not present

## 2019-09-13 DIAGNOSIS — Z7189 Other specified counseling: Secondary | ICD-10-CM

## 2019-09-13 DIAGNOSIS — E7800 Pure hypercholesterolemia, unspecified: Secondary | ICD-10-CM

## 2019-09-13 NOTE — Patient Instructions (Addendum)
After Visit Summary:  We will be checking the following labs today - NONE   Medication Instructions:    Continue with your current medicines.    If you need a refill on your cardiac medications before your next appointment, please call your pharmacy.     Testing/Procedures To Be Arranged:  N/A  Follow-Up:   See me in one year.     At [facility], you and your health needs are our priority.  As part of our continuing mission to provide you with exceptional heart care, we have created designated Provider Care Teams.  These Care Teams include your primary Cardiologist (physician) and Advanced Practice Providers (APPs -  Physician Assistants and Nurse Practitioners) who all work together to provide you with the care you need, when you need it.  Special Instructions:  . Stay safe, stay home, wash your hands for at least 20 seconds and wear a mask when out in public.  . It was good to talk with you today.    Call the [facility] office at [phone] if you have any questions, problems or concerns.       

## 2019-09-27 DIAGNOSIS — J069 Acute upper respiratory infection, unspecified: Secondary | ICD-10-CM | POA: Diagnosis not present

## 2019-09-27 DIAGNOSIS — R05 Cough: Secondary | ICD-10-CM | POA: Diagnosis not present

## 2019-10-11 DIAGNOSIS — M79672 Pain in left foot: Secondary | ICD-10-CM | POA: Diagnosis not present

## 2019-10-11 DIAGNOSIS — L110 Acquired keratosis follicularis: Secondary | ICD-10-CM | POA: Diagnosis not present

## 2019-10-11 DIAGNOSIS — M79671 Pain in right foot: Secondary | ICD-10-CM | POA: Diagnosis not present

## 2019-10-11 DIAGNOSIS — E1140 Type 2 diabetes mellitus with diabetic neuropathy, unspecified: Secondary | ICD-10-CM | POA: Diagnosis not present

## 2019-10-11 DIAGNOSIS — L600 Ingrowing nail: Secondary | ICD-10-CM | POA: Diagnosis not present

## 2019-11-22 DIAGNOSIS — R252 Cramp and spasm: Secondary | ICD-10-CM | POA: Diagnosis not present

## 2019-11-22 DIAGNOSIS — D696 Thrombocytopenia, unspecified: Secondary | ICD-10-CM | POA: Diagnosis not present

## 2019-11-22 DIAGNOSIS — R0602 Shortness of breath: Secondary | ICD-10-CM | POA: Diagnosis not present

## 2019-11-22 DIAGNOSIS — I129 Hypertensive chronic kidney disease with stage 1 through stage 4 chronic kidney disease, or unspecified chronic kidney disease: Secondary | ICD-10-CM | POA: Diagnosis not present

## 2019-11-22 DIAGNOSIS — E1140 Type 2 diabetes mellitus with diabetic neuropathy, unspecified: Secondary | ICD-10-CM | POA: Diagnosis not present

## 2019-11-29 DIAGNOSIS — K219 Gastro-esophageal reflux disease without esophagitis: Secondary | ICD-10-CM | POA: Diagnosis not present

## 2019-11-29 DIAGNOSIS — E1122 Type 2 diabetes mellitus with diabetic chronic kidney disease: Secondary | ICD-10-CM | POA: Diagnosis not present

## 2019-11-29 DIAGNOSIS — E782 Mixed hyperlipidemia: Secondary | ICD-10-CM | POA: Diagnosis not present

## 2019-11-29 DIAGNOSIS — N1832 Chronic kidney disease, stage 3b: Secondary | ICD-10-CM | POA: Diagnosis not present

## 2019-11-29 DIAGNOSIS — G9009 Other idiopathic peripheral autonomic neuropathy: Secondary | ICD-10-CM | POA: Diagnosis not present

## 2019-12-20 DIAGNOSIS — E782 Mixed hyperlipidemia: Secondary | ICD-10-CM | POA: Diagnosis not present

## 2019-12-20 DIAGNOSIS — J449 Chronic obstructive pulmonary disease, unspecified: Secondary | ICD-10-CM | POA: Diagnosis not present

## 2019-12-20 DIAGNOSIS — Z23 Encounter for immunization: Secondary | ICD-10-CM | POA: Diagnosis not present

## 2019-12-20 DIAGNOSIS — N1832 Chronic kidney disease, stage 3b: Secondary | ICD-10-CM | POA: Diagnosis not present

## 2019-12-20 DIAGNOSIS — E1122 Type 2 diabetes mellitus with diabetic chronic kidney disease: Secondary | ICD-10-CM | POA: Diagnosis not present

## 2019-12-30 DIAGNOSIS — H1045 Other chronic allergic conjunctivitis: Secondary | ICD-10-CM | POA: Diagnosis not present

## 2019-12-30 DIAGNOSIS — Z0001 Encounter for general adult medical examination with abnormal findings: Secondary | ICD-10-CM | POA: Diagnosis not present

## 2019-12-30 DIAGNOSIS — E1122 Type 2 diabetes mellitus with diabetic chronic kidney disease: Secondary | ICD-10-CM | POA: Diagnosis not present

## 2019-12-30 DIAGNOSIS — J449 Chronic obstructive pulmonary disease, unspecified: Secondary | ICD-10-CM | POA: Diagnosis not present

## 2019-12-30 DIAGNOSIS — N1832 Chronic kidney disease, stage 3b: Secondary | ICD-10-CM | POA: Diagnosis not present

## 2020-01-03 DIAGNOSIS — N1832 Chronic kidney disease, stage 3b: Secondary | ICD-10-CM | POA: Diagnosis not present

## 2020-01-03 DIAGNOSIS — I129 Hypertensive chronic kidney disease with stage 1 through stage 4 chronic kidney disease, or unspecified chronic kidney disease: Secondary | ICD-10-CM | POA: Diagnosis not present

## 2020-01-03 DIAGNOSIS — E1129 Type 2 diabetes mellitus with other diabetic kidney complication: Secondary | ICD-10-CM | POA: Diagnosis not present

## 2020-01-31 DIAGNOSIS — E1122 Type 2 diabetes mellitus with diabetic chronic kidney disease: Secondary | ICD-10-CM | POA: Diagnosis not present

## 2020-01-31 DIAGNOSIS — N1832 Chronic kidney disease, stage 3b: Secondary | ICD-10-CM | POA: Diagnosis not present

## 2020-02-04 DIAGNOSIS — J449 Chronic obstructive pulmonary disease, unspecified: Secondary | ICD-10-CM | POA: Diagnosis not present

## 2020-02-04 DIAGNOSIS — E1122 Type 2 diabetes mellitus with diabetic chronic kidney disease: Secondary | ICD-10-CM | POA: Diagnosis not present

## 2020-02-04 DIAGNOSIS — E782 Mixed hyperlipidemia: Secondary | ICD-10-CM | POA: Diagnosis not present

## 2020-02-04 DIAGNOSIS — N1832 Chronic kidney disease, stage 3b: Secondary | ICD-10-CM | POA: Diagnosis not present

## 2020-02-04 DIAGNOSIS — Z23 Encounter for immunization: Secondary | ICD-10-CM | POA: Diagnosis not present

## 2020-02-08 ENCOUNTER — Other Ambulatory Visit: Payer: Self-pay | Admitting: Neurology

## 2020-02-09 ENCOUNTER — Telehealth: Payer: Self-pay | Admitting: Neurology

## 2020-02-09 NOTE — Telephone Encounter (Signed)
1) Medication(s) Requested (by name): Pt unsure the name of medications states he needs both he was prescribed 2) Pharmacy of Choice: [facility] in [place]   Pt states he needs his medications and was scheduled next avail with dr Jaynee Eagles per pt request pt states he would like to be sooner than next avail with MD. Requesting a cb from [employer]

## 2020-02-09 NOTE — Telephone Encounter (Signed)
I called the pt and LVM asking for call back. When he calls back if I am unavailable please encourage patient to see either Amy Janett Billow or Jinny Blossom whomever has soonest availability for this yearly follow up. Reassure him we can provide a refill of the Propranolol and Gabapentin to last until he is seen but since it has been a year he really should go ahead and see an NP instead of waiting for Dr. Jaynee Eagles in August.

## 2020-02-09 NOTE — Telephone Encounter (Signed)
Attempted to reach pt to schedule next avail with NP for med refill per RN   No answer LVM to call back

## 2020-02-10 ENCOUNTER — Ambulatory Visit: Payer: Medicare Other | Admitting: Adult Health

## 2020-02-10 ENCOUNTER — Encounter: Payer: Self-pay | Admitting: Adult Health

## 2020-02-10 ENCOUNTER — Other Ambulatory Visit: Payer: Self-pay

## 2020-02-10 VITALS — BP 126/82 | HR 64 | Ht 71.0 in | Wt 228.0 lb

## 2020-02-10 DIAGNOSIS — G629 Polyneuropathy, unspecified: Secondary | ICD-10-CM | POA: Diagnosis not present

## 2020-02-10 DIAGNOSIS — Z299 Encounter for prophylactic measures, unspecified: Secondary | ICD-10-CM | POA: Diagnosis not present

## 2020-02-10 DIAGNOSIS — G3184 Mild cognitive impairment, so stated: Secondary | ICD-10-CM

## 2020-02-10 DIAGNOSIS — G250 Essential tremor: Secondary | ICD-10-CM | POA: Diagnosis not present

## 2020-02-10 MED ORDER — GABAPENTIN 300 MG PO CAPS: 300.0000 mg | ORAL_CAPSULE | Freq: Two times a day (BID) | ORAL | 3 refills | Status: DC

## 2020-02-10 MED ORDER — PROPRANOLOL HCL ER 60 MG PO CP24: 60.0000 mg | ORAL_CAPSULE | Freq: Every day | ORAL | 3 refills | Status: DC

## 2020-02-10 NOTE — Progress Notes (Addendum)
[facility]  Provider:  Dr Jaynee Eagles Referring Provider: Celene Squibb, MD Primary Care Physician:  Wende Neighbors, MD  CC: tremor  HPI:  Today, 02/10/2020, [name] returns for yearly follow-up regarding tremors, cognitive impairment and peripheral neuropathy management and medication refills.  On propranolol ER 60 mg daily with benefit in regards to tremors.  Tolerating well without hypotension, bradycardia or any other side effects.  Cognition has been stable without worsening.  Declines memory testing.  Neuropathy has improved some on gabapentin 300 mg daily.  Tolerating dosage well.  He is wondering if there is something better for him to try. Blood pressure today 126/82.  No concerns at this time.   History provided for reference purposes only Interval History via virtual visit 01/14/2019 Dr. Jaynee Eagles: He takes 64m propranolol once a day and he is happy but gets tremulous later in the day.We discussed it is a 2-3x a day medication but he doesn;t want to take medication multiple times a day. We can change him to ER 693mand see if that helps for his tremor. His memory is stable.  He gets shakes later. Advised he could take 407mwice daily he prefers 49m67m. Stop IR. His feet hurt, he is not taking the gabapentin. Explained this helps with tremor as well. Discussed side effects.  Interval history 11/17/2017 Dr. AherJaynee Eaglestient is here for follow up of tremor on propranolol as well as memory changes. He did not tolerate Aricept. He does well on propranolol.  Also neuropathy is stable. He has been sick, congestion and cough and in his lungs. He is on prednisone, he is feeling a little better. Propranolol is helping. Memory is stable. He has to take 40mg38me days because he shales so much, no side effects if he take 40mg 21mtimes a day, we can increase his dosage to 40mg a42me understands he can cut it in half if needed. His neuropathy is stable. His memory is stable. His tremor is a  little worse, will increase propranolol and reviewed side effects and when he should stop. Memory is stable. Taking B12.   Interval history 05/19/2017 Dr. Ahern: Jaynee Eaglesanolol is still helping. He feels his memory is stable.  He takes his own medications and doesn;t miss any, he pays his bills every month and not missing pills or double paying, no difficulties driving, no accidents in the home. Not getting lost. He knows the date every day, he reads the newspaper every day, he walks, he socializes with his family sees family every day. He is still taking B12 supplement. He worries about things, he worries about mowing the yard because he runs out of time. He works on Wednesday and Greensb[place]len[place]pathy is stable in his feet, has burning which is chronic and stable. Recommend daily ASA for stroke prevention. He hydrates well. He sometimes feels lightheaded for the last 2-3 months, takes propranolol 3x a day recommend maybe decreasing to twice a day and if symptoms of lightheadedness/dizzy get worse call us. He [place] had hypoglycemia and was seen in the ED and this could be the problem as well.   Interval history for tremor 11/04/2016 Dr. Ahern: Jaynee Eaglesnt is here for follow up on tremor on propranolol. He is fine taking the propranolol 3x a day. He can take it up to 4x a day as needed. Memory is stable. Didn't tolerate the Aricept. Discussed the tremor and decided to increase as tolerated and needed. Discussed other medications other than aricept  but he declines we will just   Interval history 05/06/2016:[name] is a 85 y.o. male here as a referral from Dr. [place] Crane for tremor. PMHx CKD, Type 2 DM, HLD, HTN, insomnia, OSA, depression, polyneuropathy, cerebral ischemia, b12 deficiency, mantle cell lymphoma, anxiety, parkinson's diseas (?). He goes by the name [name]. Had side effects with primidone and neurontin. He has been taking 61m propranolol three times a day. It has helped with the  tremor and blood pressure has not been low and no problems taking the medication 3x a day.   MRI of the brain was unremarkable for age: IMPRESSION: Generalized cortical atrophy and chronic microvascular ischemicchanges, typical for age. No acute abnormality.  Interval history 01/22/2016: primidone and neurontin had side effects. Discussed propranolol and that this is heart medication, we need to be very mindful of his pulse and blood pressure and this can cause significant decreases in both, he should take his blood pressure and pulse daily, do not take if BP less than 11/60 or pulse < 60, stop for anything concerning whatsoever including SOB, CP. Discussed his memory, He forgetting conversation, things wife tells you, people's names, no dementia in the family. Been going on slowly progressive for at least 2 years. He takes b12 shots. He used to have a cpap and stopped. He is tired during the day. He snores, his wife doesn't. Last sleep study was 4-5 years ago. He is Morbidly obese. Epworth sleep scale only 5/10, doesn't appear to be candidate for sleep stufy. Discussed neurocognitive testing as well.  HPI: Brandon VANDENBRINKis a 85y.o. male here as a referral from Dr. [place] Cranefor tremor. PMHx CKD, Type 2 DM, HLD, HTN, insomnia, OSA, depression, polyneuropathy, cerebral ischemia, b12 deficiency, mantle cell lymphoma, anxiety, parkinson's diseas (?). He goes by the name [name] Tremor has been going on for over a year. Worsening. Some days he doesn't have it. Today is a good day. When he has it, it is continuous, both resting and postural. He is weak all over, he has pain all over, he went to physical therapy and improved. He has been taking carbidopa/levodopa and it has not helped the tremor, [place]. Getting worse. He has a difficult time writing mostly. No difficulty eating, denies excessive coffee. Nothing makes it better or unknown.  Grandmother had a tremor, she had it severely and also her head shook and she shook all over. Sister has a similar tremor. He has trouble turning pages due to the tremor. Slowly progressive. No other focal neurologic deficits.     Review of Systems: Patient complains of symptoms per HPI as well as the following symptoms: Numbness, tingling, tremors.  Pertinent negatives per HPI. All others negative.   Social History   Socioeconomic History  . Marital status: Married    Spouse name: DButch Penny . Number of children: 2  . Years of education: 9th grade  . Highest education level: Not on file  Occupational History  . 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 Smoking status: Never Smoker  . Smokeless tobacco: Never Used  Substance and Sexual Activity  . Alcohol use: No  . Drug use: No  . Sexual activity: Yes    Birth control/protection: None  Other Topics Concern  . Not on file  Social History Narrative   Lives with wife   Caffeine use: coffee 1 cup some days  of the week. Update 02/10/2020 ("very rarely though")   Right-handed   Social Determinants of Health   Financial Resource Strain:   . Difficulty of Paying Living Expenses:   Food Insecurity:   . Worried About Charity fundraiser in the Last Year:   . Arboriculturist in the Last Year:   Transportation Needs:   . Film/video editor (Medical):   [name] Lack of Transportation (Non-Medical):   Physical Activity:   . Days of Exercise per Week:   . Minutes of Exercise per Session:   Stress:   . Feeling of Stress :   Social Connections:   . Frequency of Communication with Friends and Family:   . Frequency of Social Gatherings with Friends and Family:   . Attends Religious Services:   . Active Member of Clubs or Organizations:   . Attends Archivist Meetings:   [name] Marital Status:   Intimate Partner Violence:   . Fear of Current or Ex-Partner:   . Emotionally Abused:   [name] Physically Abused:   .  Sexually Abused:     Family History  Problem Relation Age of Onset  . Colon cancer Brother   . Cancer Brother   . Diabetes Father   . Cancer Brother     Past Medical History:  Diagnosis Date  . B12 deficiency 02/07/2014  . Colon cancer ([place])   . 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 Essential hypertension, benign   . GERD (gastroesophageal reflux disease)   . Mixed hyperlipidemia   . Obstructive sleep apnea    does not use  . Prostate cancer ([place])   . PVD (peripheral vascular disease) ([place])   . Type 2 diabetes mellitus ([place])     Past Surgical History:  Procedure Laterality Date  . BACK SURGERY    . BALLOON DILATION N/A 08/27/2013   Procedure: BALLOON DILATION;  Surgeon: Rogene Houston, MD;  [facility];  Service: Endoscopy;  Laterality: N/A;  . COLON SURGERY    . COLONOSCOPY N/A 12/17/2013   Procedure: COLONOSCOPY;  Surgeon: Rogene Houston, MD;  [facility];  Service: Endoscopy;  Laterality: N/A;  940  . COLONOSCOPY N/A 02/28/2014   Procedure: COLONOSCOPY;  Surgeon: Rogene Houston, MD;  [facility];  Service: Endoscopy;  Laterality: N/A;  730  . COLONOSCOPY N/A 08/04/2015   Procedure: COLONOSCOPY;  Surgeon: Rogene Houston, MD;  [facility];  Service: Endoscopy;  Laterality: N/A;  200 - moved to 11/11 @ 2:10 - Ann to notify pt  . COLONOSCOPY WITH ESOPHAGOGASTRODUODENOSCOPY (EGD) N/A 08/27/2013   Procedure: COLONOSCOPY WITH ESOPHAGOGASTRODUODENOSCOPY (EGD);  Surgeon: Rogene Houston, MD;  [facility];  Service: Endoscopy;  Laterality: N/A;  730  . IR REMOVAL TUN ACCESS W/ PORT W/O FL MOD SED  07/17/2018  . KNEE ARTHROSCOPY Left   . KNEE SURGERY Right    total knee  . MALONEY DILATION N/A 08/27/2013   Procedure: Venia Minks DILATION;  Surgeon: Rogene Houston, MD;  [facility];  Service: Endoscopy;  Laterality: N/A;   . PORTACATH PLACEMENT Right 2014  . PROSTATECTOMY    . removal of port Right   . SAVORY DILATION N/A 08/27/2013   Procedure: SAVORY DILATION;  Surgeon: Rogene Houston, MD;  [facility];  Service: Endoscopy;  Laterality: N/A;  .  SHOULDER SURGERY    . TONSILLECTOMY    . VASECTOMY      Current Outpatient Medications  Medication Sig Dispense Refill  . acetaminophen (TYLENOL) 325 MG tablet Take 650 mg by mouth every 4 (four) hours as needed for mild pain or moderate pain. Take 2 tablets 1 hour prior to Rituxan treatment.    [name] albuterol (PROAIR HFA) 108 (90 Base) MCG/ACT inhaler Inhale 2 puffs into the lungs at bedtime as needed for wheezing or shortness of breath.     [name] amLODipine (NORVASC) 5 MG tablet TAKE ONE TABLET BY MOUTH ONCE DAILY. 90 tablet 3  . atorvastatin (LIPITOR) 20 MG tablet Take 20 mg by mouth daily.    . budesonide-formoterol (SYMBICORT) 160-4.5 MCG/ACT inhaler Inhale 2 puffs into the lungs 2 (two) times daily.    [name] docusate sodium (COLACE) 100 MG capsule Take 200 mg by mouth daily as needed for mild constipation.    . fluticasone (FLONASE) 50 MCG/ACT nasal spray Place 1 spray into both nostrils as needed for allergies or rhinitis.    [name] gabapentin (NEURONTIN) 300 MG capsule Take 1 capsule (300 mg total) by mouth 2 (two) times daily. For pain in the feet. 180 capsule 3  . glipiZIDE (GLUCOTROL) 10 MG tablet Take 10 mg by mouth 2 (two) times daily before a meal.     . hydrocortisone 2.5 % cream Apply 1 application topically as needed (itching).    . insulin lispro (HUMALOG) 100 UNIT/ML cartridge Inject 1-10 Units into the skin as directed. On sliding scale when eating a big meal     . ipratropium (ATROVENT) 0.06 % nasal spray Place 2 sprays into both nostrils 2 (two) times daily.     . meclizine (ANTIVERT) 25 MG tablet Take 25 mg by mouth 2 (two) times daily as needed for dizziness.     . ondansetron (ZOFRAN) 4 MG tablet Take by mouth as needed.     . pantoprazole  (PROTONIX) 40 MG tablet TAKE 1 TABLET BY MOUTH 2 TIMES DAILY BEFORE A MEAL. 60 tablet 11  . propranolol ER (INDERAL LA) 60 MG 24 hr capsule Take 1 capsule (60 mg total) by mouth at bedtime. For tremor. 90 capsule 3  . testosterone cypionate (DEPOTESTOSTERONE CYPIONATE) 200 MG/ML injection Inject 200 mg into the muscle every 14 (fourteen) days.     [name] tobramycin (TOBREX) 0.3 % ophthalmic solution Place 1 drop into both eyes daily as needed.     Nelva Nay SOLOSTAR 300 UNIT/ML SOPN Inject 70 Units into the skin every morning.      No current facility-administered medications for this visit.   Facility-Administered Medications Ordered in Other Visits  Medication Dose Route Frequency Provider Last Rate Last Admin  . heparin lock flush 100 unit/mL  500 Units Intravenous Once Kefalas, Thomas S, PA-C      . sodium chloride flush (NS) 0.9 % injection 20 mL  20 mL Intravenous PRN Baird Cancer, PA-C        Allergies as of 02/10/2020 - Review Complete 02/10/2020  Allergen Reaction Noted  . Bee venom Anaphylaxis   . Adhesive [tape] Hives   . Ciprofloxacin    . Codeine    . Iodine    . Latex    . Povidone-iodine  10/15/2010  . Simvastatin      Vitals: Today's Vitals   02/10/20 0734  BP: 126/82  Pulse: 64  Weight: 228 lb (103.4 kg)  Height: 5' 11"  (1.803 m)   Body  mass index is 31.8 kg/m.  PHYSICAL EXAM:   Cranial Nerves:  The pupils are equal, round, and reactive to light. Visual fields are full to finger confrontation. Extraocular movements are intact. Trigeminal sensation is intact and the muscles of mastication are normal. The face is symmetric. The palate elevates in the midline. Hearing intact. Voice is normal. Shoulder shrug is normal. The tongue has normal motion without fasciculations.   Coordination:  Normal finger to nose and heel to shin.   Gait:  Mildly wide based  Motor Observation:  No asymmetry, no atrophy, unable to appreciate action tremors or resting  tremor during today's visit.  Tone:  Normal muscle tone. No cogwheeling. Not increased.   Posture:  Posture is normal. normal erect   Strength:  Strength is V/V in the upper and lower limbs.    Sensation: dec pin prick, toes to the knees. Absent vibration at the toes but equal at ankles and knees.    Reflex Exam:  DTR's:  Deep tendon reflexes in the upper and lower extremities are hypo bilaterally.  Toes:  The toes are downgoing bilaterally.  Clonus:  Clonus is absent.    Assessment/Plan: 85  year old patient with what appears to be a familial essential tremor, memory loss (mild cogniitve impairment) and peripheral neuropathy.  Family history of tremor.  No evidence of Parkinson's or parkinsonism.   - Tremor excellent improvement on propranolol ER 60 mg daily without evidence of tremors at today's visit.  Tolerating dosage well without side effects.  Denies hypotension or bradycardia.  Refill provided  -Peripheral neuropathy: Increase gabapentin to 300 mg twice daily as he did benefit some from lower dose.  Discussion regarding possible side effects such as increased fatigue, dizziness or increased risk of falls and verbalized understanding and wishes to proceed.  He will call with any difficulty tolerating or if he wishes to increase dose further after 1 to 2 months  - Memory loss:Stable.  Declines MMSE at today's visit.  No indication for initiation of medical management at this time.   Follow-up in 1 year or call earlier if needed   I spent 25 minutes of face-to-face and non-face-to-face time with patient.  This included previsit chart review, lab review, study review, order entry, electronic health record documentation, patient education   Frann Rider, [facility]  [facility] [address] [place], [place] [address]  Phone [phone] Fax [phone] Note: This document was prepared with digital  dictation and possible smart phrase technology. Any transcriptional errors that result from this process are unintentional.  Made any corrections needed, and agree with history, physical, neuro exam,assessment and plan as stated.     Sarina Ill, MD [facility]

## 2020-02-10 NOTE — Patient Instructions (Addendum)
Your Plan:  Continue propranolol ER 60 mg daily for essential tremors  Try to increase gabapentin to 328m twice daily - if you have difficulty tolerating morning dose, plesae let u[place]now and we can try to switch both capsules to night time. If you are tolerating twice daily dosing well but continue to experience pain after 2 months, please call office for possible dosage increase    Follow up in 1 year or call earlier if needed     Thank you for coming to [facility]. I hope we have been able to provide you high quality care today.  You may receive a patient satisfaction survey over the next few weeks. We would appreciate your feedback and comments so that we may continue to improve ourselves and the health of our patients.

## 2020-03-06 DIAGNOSIS — D631 Anemia in chronic kidney disease: Secondary | ICD-10-CM | POA: Diagnosis not present

## 2020-03-06 DIAGNOSIS — C8310 Mantle cell lymphoma, unspecified site: Secondary | ICD-10-CM | POA: Diagnosis not present

## 2020-03-06 DIAGNOSIS — E1122 Type 2 diabetes mellitus with diabetic chronic kidney disease: Secondary | ICD-10-CM | POA: Diagnosis not present

## 2020-03-06 DIAGNOSIS — D519 Vitamin B12 deficiency anemia, unspecified: Secondary | ICD-10-CM | POA: Diagnosis not present

## 2020-03-06 DIAGNOSIS — B349 Viral infection, unspecified: Secondary | ICD-10-CM | POA: Diagnosis not present

## 2020-03-13 DIAGNOSIS — N1832 Chronic kidney disease, stage 3b: Secondary | ICD-10-CM | POA: Diagnosis not present

## 2020-03-13 DIAGNOSIS — E782 Mixed hyperlipidemia: Secondary | ICD-10-CM | POA: Diagnosis not present

## 2020-03-13 DIAGNOSIS — K219 Gastro-esophageal reflux disease without esophagitis: Secondary | ICD-10-CM | POA: Diagnosis not present

## 2020-03-13 DIAGNOSIS — E1122 Type 2 diabetes mellitus with diabetic chronic kidney disease: Secondary | ICD-10-CM | POA: Diagnosis not present

## 2020-03-13 DIAGNOSIS — G9009 Other idiopathic peripheral autonomic neuropathy: Secondary | ICD-10-CM | POA: Diagnosis not present

## 2020-04-03 DIAGNOSIS — N1832 Chronic kidney disease, stage 3b: Secondary | ICD-10-CM | POA: Diagnosis not present

## 2020-04-03 DIAGNOSIS — G9009 Other idiopathic peripheral autonomic neuropathy: Secondary | ICD-10-CM | POA: Diagnosis not present

## 2020-04-03 DIAGNOSIS — J449 Chronic obstructive pulmonary disease, unspecified: Secondary | ICD-10-CM | POA: Diagnosis not present

## 2020-04-03 DIAGNOSIS — E782 Mixed hyperlipidemia: Secondary | ICD-10-CM | POA: Diagnosis not present

## 2020-04-03 DIAGNOSIS — E1122 Type 2 diabetes mellitus with diabetic chronic kidney disease: Secondary | ICD-10-CM | POA: Diagnosis not present

## 2020-04-05 ENCOUNTER — Other Ambulatory Visit (INDEPENDENT_AMBULATORY_CARE_PROVIDER_SITE_OTHER): Payer: Self-pay | Admitting: Internal Medicine

## 2020-04-05 DIAGNOSIS — K219 Gastro-esophageal reflux disease without esophagitis: Secondary | ICD-10-CM

## 2020-04-26 ENCOUNTER — Other Ambulatory Visit: Payer: Self-pay | Admitting: Internal Medicine

## 2020-04-26 ENCOUNTER — Telehealth: Payer: Self-pay | Admitting: Internal Medicine

## 2020-04-26 DIAGNOSIS — G9009 Other idiopathic peripheral autonomic neuropathy: Secondary | ICD-10-CM | POA: Diagnosis not present

## 2020-04-26 DIAGNOSIS — N1832 Chronic kidney disease, stage 3b: Secondary | ICD-10-CM | POA: Diagnosis not present

## 2020-04-26 DIAGNOSIS — J449 Chronic obstructive pulmonary disease, unspecified: Secondary | ICD-10-CM | POA: Diagnosis not present

## 2020-04-26 DIAGNOSIS — E1122 Type 2 diabetes mellitus with diabetic chronic kidney disease: Secondary | ICD-10-CM | POA: Diagnosis not present

## 2020-04-26 DIAGNOSIS — E782 Mixed hyperlipidemia: Secondary | ICD-10-CM | POA: Diagnosis not present

## 2020-04-26 DIAGNOSIS — R918 Other nonspecific abnormal finding of lung field: Secondary | ICD-10-CM

## 2020-04-26 NOTE — Telephone Encounter (Signed)
Noted, appointment has been made.

## 2020-05-05 ENCOUNTER — Other Ambulatory Visit (INDEPENDENT_AMBULATORY_CARE_PROVIDER_SITE_OTHER): Payer: Self-pay | Admitting: Gastroenterology

## 2020-05-05 DIAGNOSIS — K219 Gastro-esophageal reflux disease without esophagitis: Secondary | ICD-10-CM

## 2020-05-06 ENCOUNTER — Other Ambulatory Visit: Payer: Medicare Other

## 2020-05-08 NOTE — Telephone Encounter (Signed)
Will refill medication for 3 months, needs follow up appointment with Dr. Laural Golden in October for yearly follow up .  Thanks,  Maylon Peppers, MD Gastroenterology and Hepatology [facility]

## 2020-05-11 ENCOUNTER — Other Ambulatory Visit: Payer: Medicare Other

## 2020-05-11 ENCOUNTER — Ambulatory Visit
Admission: RE | Admit: 2020-05-11 | Discharge: 2020-05-11 | Disposition: A | Discharge status: Home / Self Care | Payer: Medicare Other | Source: Ambulatory Visit | Attending: Internal Medicine | Admitting: Internal Medicine

## 2020-05-11 DIAGNOSIS — I700 Atherosclerosis of aorta: Secondary | ICD-10-CM | POA: Diagnosis not present

## 2020-05-11 DIAGNOSIS — R918 Other nonspecific abnormal finding of lung field: Secondary | ICD-10-CM | POA: Diagnosis not present

## 2020-05-11 DIAGNOSIS — J479 Bronchiectasis, uncomplicated: Secondary | ICD-10-CM | POA: Diagnosis not present

## 2020-05-11 DIAGNOSIS — J929 Pleural plaque without asbestos: Secondary | ICD-10-CM | POA: Diagnosis not present

## 2020-05-12 ENCOUNTER — Other Ambulatory Visit: Payer: Self-pay

## 2020-05-12 ENCOUNTER — Encounter: Payer: Self-pay | Admitting: Internal Medicine

## 2020-05-12 ENCOUNTER — Ambulatory Visit: Payer: Medicare Other | Admitting: Internal Medicine

## 2020-05-12 DIAGNOSIS — R918 Other nonspecific abnormal finding of lung field: Secondary | ICD-10-CM

## 2020-05-12 DIAGNOSIS — J4530 Mild persistent asthma, uncomplicated: Secondary | ICD-10-CM

## 2020-05-12 MED ORDER — ALBUTEROL SULFATE HFA 108 (90 BASE) MCG/ACT IN AERS: 2.0000 | INHALATION_SPRAY | Freq: Every evening | RESPIRATORY_TRACT | 1 refills | Status: DC | PRN

## 2020-05-12 MED ORDER — BUDESONIDE-FORMOTEROL FUMARATE 160-4.5 MCG/ACT IN AERO: 2.0000 | INHALATION_SPRAY | Freq: Two times a day (BID) | RESPIRATORY_TRACT | 11 refills | Status: DC

## 2020-05-12 NOTE — Progress Notes (Signed)
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     History of Present Illness  07/10/2016 1st [facility] office visit/ Johnpatrick Jenny   Chief Complaint  Patient presents with  . Pulmonary Consult    Self referral. Pt c/o SOB for the past 6 months. He c/o SOB "all the time" "worse when I can't get air". He also c/o cough with large amounts of yellow sputum.   initially started with orthopnea x 6 month then sinus/throat/chest sensations congestion and then cough in that order as above and now sleeping in 60 degrees.   Some better on inhaler first saba then symbicort 50% improvement but ran out 4 d prior to OV and no change since then  rec Plan A = Automatic = symbicort 80 Take 2 puffs first thing in am and then another 2 puffs about 12 hours later and change the inderol to where you just take it in AM and afternoon to see what difference if any it makes in your night symptoms Work on inhaler technique:   Plan B = Backup Only use your albuterol(PROAIR)  as a rescue medication Augmentin 875 mg take one pill twice daily  X 10 days - Pantoprazole 40 mg Take 30- 60 min before your first and last meals of the day  GERD (REFLUX)  Diet   Please remember to go to the lab  department downstairs for your tests - we will call you with the results when they are available. Please schedule a follow up office visit in 2 weeks, sooner if needed with all active medications in hand    07/30/2016  f/u ov/Domenico Achord re: sob unexplained, did not bring meds "they are the same"  (they are not)  Chief  Complaint  Patient presents with  . Follow-up    Still feeling SOB all the time even at rest, used up all of his inhalers, Would like to dicuss CT scan results, Pt. denies chest pain   only taking one inderal per day / on no inhalers at all and no change on vs off or on lower inderal dose but shaking is no worse on just the am dose rec For drainage / throat tickle try take CHLORPHENIRAMINE  4 mg - take one every 4 hours as needed   Try off symbicort 80 x one week to see what difference if any this makes and if worse breathing/ coughing then start it back  You need another CHEST CT scan in 2 years   05/12/2020  f/u ov/Ezrah Panning re:  Cough  Chief Complaint  Patient presents with  . Follow-up    review CT results   Dyspnea: Not limited by breathing from desired activities   Cough: p stirs white mucus only   Sleeping: on pillow / bed flat SABA use: none  02: none    No obvious day to day or daytime variability or assoc   purulent sputum or mucus plugs or hemoptysis or cp or chest tightness, subjective wheeze or overt sinus or hb symptoms.  Sleeping  without nocturnal  or early am exacerbation  of respiratory  c/o's or need for noct saba. Also denies any obvious fluctuation of symptoms with weather or environmental changes or other aggravating or alleviating factors except as outlined above   No unusual exposure hx or h/o childhood pna/ asthma or knowledge of premature birth.  Current Allergies, Complete Past Medical History, Past Surgical History, Family History, and Social History were reviewed in [employer] record.  ROS  The following are not active complaints unless bolded Hoarseness, sore throat, dysphagia, dental problems, itching, sneezing,  nasal congestion or discharge of excess mucus or purulent secretions, ear ache,   fever, chills, sweats, unintended wt loss or wt gain, classically pleuritic or exertional cp,  orthopnea pnd or arm/hand swelling  or leg  swelling, presyncope, palpitations, abdominal pain, anorexia, nausea, vomiting, diarrhea  or change in bowel habits or change in bladder habits, change in stools or change in urine, dysuria, hematuria,  rash, arthralgias, visual complaints, headache, numbness, weakness or ataxia or problems with walking or coordination,  change in mood or  memory.        Current Meds  Medication Sig  . acetaminophen (TYLENOL) 325 MG tablet Take 650 mg by mouth every 4 (four) hours as needed for mild pain or moderate pain. Take 2 tablets 1 hour prior to Rituxan treatment.  [name] albuterol (PROAIR HFA) 108 (90 Base) MCG/ACT inhaler Inhale 2 puffs into the lungs at bedtime as needed for wheezing or shortness of breath.   [name] amLODipine (NORVASC) 5 MG tablet TAKE ONE TABLET BY MOUTH ONCE DAILY.  [name] atorvastatin (LIPITOR) 20 MG tablet Take 20 mg by mouth daily.  [name] docusate sodium (COLACE) 100 MG capsule Take 200 mg by mouth daily as needed for mild constipation.  . fluticasone (FLONASE) 50 MCG/ACT nasal spray Place 1 spray into both nostrils as needed for allergies or rhinitis.  [name] gabapentin (NEURONTIN) 300 MG capsule Take 1 capsule (300 mg total) by mouth 2 (two) times daily. For pain in the feet.  [name] glipiZIDE (GLUCOTROL) 10 MG tablet Take 10 mg by mouth 2 (two) times daily before a meal.   . hydrocortisone 2.5 % cream Apply 1 application topically as needed (itching).  . insulin lispro (HUMALOG) 100 UNIT/ML cartridge Inject 1-10 Units into the skin as directed. On sliding scale when eating a big meal   . ipratropium (ATROVENT) 0.06 % nasal spray Place 2 sprays into both nostrils 2 (two) times daily.   . meclizine (ANTIVERT) 25 MG tablet Take 25 mg by mouth 2 (two) times daily as needed for dizziness.   . ondansetron (ZOFRAN) 4 MG tablet Take by mouth as needed.   . pantoprazole (PROTONIX) 40 MG tablet TAKE 1 TABLET BY MOUTH 2 TIMES DAILY BEFORE A MEAL.  [name] propranolol ER (INDERAL LA) 60 MG 24 hr capsule Take 1 capsule (60 mg  total) by mouth at bedtime. For tremor.  [name] testosterone cypionate (DEPOTESTOSTERONE CYPIONATE) 200 MG/ML injection Inject 200 mg into the muscle every 14 (fourteen) days.   [name] tobramycin (TOBREX) 0.3 % ophthalmic solution Place 1 drop into both eyes daily as needed.   Nelva Nay SOLOSTAR 300 UNIT/ML SOPN Inject 70 Units into the skin every morning.                  Objective:   Physical Exam       05/12/2020         07/30/2016  244   07/10/16 245 lb (111.1 kg)  06/24/16 247 lb 12.8 oz (112.4 kg)  06/03/16 249 lb (112.9 kg)    Vital signs reviewed - - Note on arrival 02 sats  96% on RA     HEENT : pt wearing mask not removed for exam due to covid -19 concerns.    NECK :  without JVD/Nodes/TM/ nl carotid upstrokes bilaterally   LUNGS: no acc muscle use,  Nl contour chest which is clear to A and P bilaterally without cough on insp or exp maneuvers   CV:  RRR  no s3 or murmur or increase in P2, and no edema   ABD:  Obese/ soft and nontender with nl inspiratory excursion in the supine position. No bruits or organomegaly appreciated, bowel sounds nl  MS:  Nl gait/ ext warm without deformities, calf tenderness, cyanosis or clubbing No obvious joint restrictions   SKIN: warm and dry without lesions    NEURO:  alert, approp, nl sensorium with  no motor or cerebellar deficits apparent.                       Assessment & Plan:

## 2020-05-12 NOTE — Assessment & Plan Note (Addendum)
Spirometry 07/10/2016  FEV1 2.02 (66%)  Ratio 68 but contour not physiologic on effort dep portion of f/v loop - 07/10/2016  After extensive coaching HFA effectiveness =    50% > try symb 80 2bid   - FENO 07/10/2016  =   12 s ICS - Allergy profile 07/10/16  >  Eos 0.1 /  IgE  20 neg RAST  Despite use of propranolol  >> all goals of chronic asthma control met including optimal function and elimination of symptoms with minimal need for rescue therapy.  Contingencies discussed in full including contacting this office immediately if not controlling the symptoms using the rule of two's.    Advised can adjust symbicort up/ down Based on two studies from [place]; 20 p 1865 (2018) and 380 : p2020-30 (2019) in pts with mild asthma it is reasonable to use low dose symbicort eg 80 2bid "prn" flare in this setting but I emphasized this was only shown with symbicort and takes advantage of the rapid onset of action but is not the same as "rescue therapy" but can be stopped once the acute symptoms have resolved and the need for rescue has been minimized (< 2 x weekly)            Each maintenance medication was reviewed in detail including emphasizing most importantly the difference between maintenance and prns and under what circumstances the prns are to be triggered using an action plan format where appropriate.  Total time for H and P, chart review, counseling, teaching device and generating customized AVS unique to this office visit / charting = 20 min

## 2020-05-12 NOTE — Assessment & Plan Note (Addendum)
See CT chest 06/17/16   - repeat CT 05/29/2018 :  8 x 11 mm left lower lobe ground-glass nodule, unchanged. Two year stability has been demonstrated.  - CT 05/11/2020 V1. Stable LEFT lower lobe nodules. The largest is a ground-glass nodule. Consider a 1 year follow-up to complete 5 years total stability. 2. Signs of septal thickening and subpleural reticulation with ground-glass and question of early bronchiectasis. Correlate with any respiratory symptoms.   Discussed in detail all the  indications, usual  risks and alternatives  relative to the benefits with patient who declines  to proceed with repeat CT at this point.

## 2020-05-12 NOTE — Addendum Note (Signed)
Addended by: Christinia Gully B on: 05/12/2020 03:32 PM   Modules accepted: Orders

## 2020-05-12 NOTE — Patient Instructions (Addendum)
Sumbicort  160 1-2 puff each and  1-2 puffs each  - if worse take 2 every 12 hours until better for a week then taper     If you are satisfied with your treatment plan,  let your doctor know and he/she can either refill your medications or you can return here when your prescription runs out.     If in any way you are not 100% satisfied,  please tell us.  If 100% better, tell your friends!  Pulmonary follow up is as needed

## 2020-05-15 DIAGNOSIS — J479 Bronchiectasis, uncomplicated: Secondary | ICD-10-CM | POA: Diagnosis not present

## 2020-05-15 DIAGNOSIS — R0781 Pleurodynia: Secondary | ICD-10-CM | POA: Diagnosis not present

## 2020-05-23 ENCOUNTER — Ambulatory Visit: Payer: Medicare Other | Admitting: Neurology

## 2020-05-30 NOTE — Progress Notes (Signed)
Spoke with pt and notified of results per Dr. Wert. Pt verbalized understanding and denied any questions. 

## 2020-06-01 DIAGNOSIS — E1122 Type 2 diabetes mellitus with diabetic chronic kidney disease: Secondary | ICD-10-CM | POA: Diagnosis not present

## 2020-06-01 DIAGNOSIS — E782 Mixed hyperlipidemia: Secondary | ICD-10-CM | POA: Diagnosis not present

## 2020-06-01 DIAGNOSIS — G9009 Other idiopathic peripheral autonomic neuropathy: Secondary | ICD-10-CM | POA: Diagnosis not present

## 2020-06-01 DIAGNOSIS — N1832 Chronic kidney disease, stage 3b: Secondary | ICD-10-CM | POA: Diagnosis not present

## 2020-06-01 DIAGNOSIS — K219 Gastro-esophageal reflux disease without esophagitis: Secondary | ICD-10-CM | POA: Diagnosis not present

## 2020-06-08 DIAGNOSIS — N281 Cyst of kidney, acquired: Secondary | ICD-10-CM | POA: Diagnosis not present

## 2020-06-19 DIAGNOSIS — G250 Essential tremor: Secondary | ICD-10-CM | POA: Diagnosis not present

## 2020-06-19 DIAGNOSIS — E1140 Type 2 diabetes mellitus with diabetic neuropathy, unspecified: Secondary | ICD-10-CM | POA: Diagnosis not present

## 2020-06-19 DIAGNOSIS — N481 Balanitis: Secondary | ICD-10-CM | POA: Diagnosis not present

## 2020-06-19 DIAGNOSIS — G4700 Insomnia, unspecified: Secondary | ICD-10-CM | POA: Diagnosis not present

## 2020-06-19 DIAGNOSIS — H65199 Other acute nonsuppurative otitis media, unspecified ear: Secondary | ICD-10-CM | POA: Diagnosis not present

## 2020-06-19 DIAGNOSIS — H814 Vertigo of central origin: Secondary | ICD-10-CM | POA: Diagnosis not present

## 2020-06-26 DIAGNOSIS — E782 Mixed hyperlipidemia: Secondary | ICD-10-CM | POA: Diagnosis not present

## 2020-06-26 DIAGNOSIS — N1832 Chronic kidney disease, stage 3b: Secondary | ICD-10-CM | POA: Diagnosis not present

## 2020-06-26 DIAGNOSIS — E1122 Type 2 diabetes mellitus with diabetic chronic kidney disease: Secondary | ICD-10-CM | POA: Diagnosis not present

## 2020-06-26 DIAGNOSIS — G9009 Other idiopathic peripheral autonomic neuropathy: Secondary | ICD-10-CM | POA: Diagnosis not present

## 2020-06-26 DIAGNOSIS — Z23 Encounter for immunization: Secondary | ICD-10-CM | POA: Diagnosis not present

## 2020-07-04 ENCOUNTER — Other Ambulatory Visit: Payer: Self-pay

## 2020-07-04 ENCOUNTER — Encounter (INDEPENDENT_AMBULATORY_CARE_PROVIDER_SITE_OTHER): Payer: Self-pay | Admitting: Internal Medicine

## 2020-07-04 ENCOUNTER — Ambulatory Visit (INDEPENDENT_AMBULATORY_CARE_PROVIDER_SITE_OTHER): Payer: Medicare Other | Admitting: Internal Medicine

## 2020-07-04 VITALS — BP 105/65 | HR 87 | Temp 97.9°F | Ht 71.0 in | Wt 232.5 lb

## 2020-07-04 DIAGNOSIS — K219 Gastro-esophageal reflux disease without esophagitis: Secondary | ICD-10-CM | POA: Diagnosis not present

## 2020-07-04 DIAGNOSIS — Z8719 Personal history of other diseases of the digestive system: Secondary | ICD-10-CM

## 2020-07-04 NOTE — Progress Notes (Addendum)
Presenting complaint;  Follow for chronic GERD. History of ulcerative colitis and mantle cell lymphoma of the colon.  Database and subjective:  Patient is 85 year old Caucasian male with chronic GERD requiring double dose PPI history of distal ulcerative colitis in remission as well as history of mantle cell lymphoma of the colon in remission who is here for yearly visit.  He states he is doing well. He does not have heartburn or regurgitation as long as he takes pantoprazole twice daily. He states Dr. [name] also tried dropping dose to once daily but he began to have severe heartburn after about a week or so. He is not having any side effects with pantoprazole. He says he has chronic kidney disease and his renal function actually has improved. He had blood work about a month ago. He is having 1-2 formed stools daily. He rarely has abdominal pain in left lower quadrant. He used to have this pain on daily basis previously. He denies melena or rectal bleeding. He also denies dysphagia. His appetite is good. He has gained 7 pounds since his last visit.  Current Medications: Outpatient Encounter Medications as of 07/04/2020  Medication Sig  . acetaminophen (TYLENOL) 325 MG tablet Take 650 mg by mouth every 4 (four) hours as needed for mild pain or moderate pain. Take 2 tablets 1 hour prior to Rituxan treatment.  [name] albuterol (PROAIR HFA) 108 (90 Base) MCG/ACT inhaler Inhale 2 puffs into the lungs at bedtime as needed for wheezing or shortness of breath.  [name] amLODipine (NORVASC) 5 MG tablet TAKE ONE TABLET BY MOUTH ONCE DAILY.  [name] atorvastatin (LIPITOR) 20 MG tablet Take 20 mg by mouth daily.  . budesonide-formoterol (SYMBICORT) 160-4.5 MCG/ACT inhaler Inhale 2 puffs into the lungs 2 (two) times daily.  [name] docusate sodium (COLACE) 100 MG capsule Take 200 mg by mouth daily as needed for mild constipation.  . fluticasone (FLONASE) 50 MCG/ACT nasal spray Place 1 spray into both nostrils as needed for  allergies or rhinitis.  [name] gabapentin (NEURONTIN) 300 MG capsule Take 1 capsule (300 mg total) by mouth 2 (two) times daily. For pain in the feet.  [name] glipiZIDE (GLUCOTROL) 10 MG tablet Take 10 mg by mouth 2 (two) times daily before a meal.   . hydrocortisone 2.5 % cream Apply 1 application topically as needed (itching).  . insulin lispro (HUMALOG) 100 UNIT/ML cartridge Inject 1-10 Units into the skin as directed. On sliding scale when eating a big meal   . ipratropium (ATROVENT) 0.06 % nasal spray Place 2 sprays into both nostrils 2 (two) times daily.   . meclizine (ANTIVERT) 25 MG tablet Take 25 mg by mouth 2 (two) times daily as needed for dizziness.   . ondansetron (ZOFRAN) 4 MG tablet Take by mouth as needed.   . pantoprazole (PROTONIX) 40 MG tablet TAKE 1 TABLET BY MOUTH 2 TIMES DAILY BEFORE A MEAL.  [name] propranolol ER (INDERAL LA) 60 MG 24 hr capsule Take 1 capsule (60 mg total) by mouth at bedtime. For tremor.  [name] testosterone cypionate (DEPOTESTOSTERONE CYPIONATE) 200 MG/ML injection Inject 200 mg into the muscle every 14 (fourteen) days.   [name] tobramycin (TOBREX) 0.3 % ophthalmic solution Place 1 drop into both eyes daily as needed.   Nelva Nay SOLOSTAR 300 UNIT/ML SOPN Inject 70 Units into the skin every morning.   . [DISCONTINUED] insulin aspart (NOVOLOG) 100 UNIT/ML injection Inject 20 Units into the skin 3 (three) times daily before meals.    . [DISCONTINUED] rosuvastatin (  CRESTOR) 40 MG tablet Take 40 mg by mouth daily.     Facility-Administered Encounter Medications as of 07/04/2020  Medication  . heparin lock flush 100 unit/mL  . sodium chloride flush (NS) 0.9 % injection 20 mL    Objective: Blood pressure 105/65, pulse 87, temperature 97.9 F (36.6 C), temperature source Oral, height 5' 11"  (1.803 m), weight 232 lb 8 oz (105.5 kg). Patient is alert and in no acute distress. He is wearing a mask. Conjunctiva is pink. Sclera is nonicteric Oropharyngeal mucosa is normal. No neck  masses or thyromegaly noted. Cardiac exam with regular rhythm normal S1 and S2. No murmur or gallop noted. Lungs are clear to auscultation. Abdomen is full but soft and nontender without organomegaly or masses. No LE edema or clubbing noted.  Labs/studies Results:  No recent lab data on file.  Assessment:  #1. Chronic GERD. Patient has required double dose PPI for symptom control. Multiple attempts at decreasing PPI dose have failed. He will therefore continue current regiment as long as it is working and he is not having any side effects. There was concern about his kidney disease but his renal function has improved while on PPI therapy. Therefore unlikely that chronic kidney disease is due to PPI therapy.  #2. History of distal ulcerative colitis. He is not taking mesalamine anymore. He remains in remission. Last EGD was in November 2016 revealing no evidence of endoscopic disease.  #3. History of mantle cell lymphoma of the colon. He was diagnosed back in December 2014 and treated with chemotherapy. Follow-up exams in March 2015, June 2015 and November 2016 revealed him to be in remission. No plans for future exams given his age unless he has symptoms.  Plan:  Continue pantoprazole at current dose of 40 mg p.o. twice daily. Continue antireflux measures as before. Request copy of recent blood work for review. Patient will call if he develops diarrhea abdominal pain or rectal bleeding. Office visit in 1 year.   Addendum Lab data from 06/19/2020 reviewed. WBC 6.0 H&H 15.3 and 47.4 Platelet count 110 K. Bilirubin 0.8, AP 63, AST 20, ALT 12 total protein 5.8 and albumin 4.2. Thrombocytopenia appears to be chronic. Results reviewed with patient.

## 2020-07-04 NOTE — Patient Instructions (Addendum)
Will request copy of recent blood work from Dr. Juel Burrow office.

## 2020-07-06 DIAGNOSIS — E119 Type 2 diabetes mellitus without complications: Secondary | ICD-10-CM | POA: Diagnosis not present

## 2020-07-20 ENCOUNTER — Ambulatory Visit: Payer: Medicare Other | Attending: Internal Medicine

## 2020-07-20 DIAGNOSIS — Z23 Encounter for immunization: Secondary | ICD-10-CM

## 2020-07-20 NOTE — Progress Notes (Signed)
   Covid-19 Vaccination Clinic  Name:  SARP VERNIER    MRN: [id number] DOB: Feb 27, 1935  07/20/2020  Mr. Ellenburg was observed post Covid-19 immunization for 15 minutes without incident. He was provided with Vaccine Information Sheet and instruction to access the V-Safe system.   Mr. Rafter was instructed to call 911 with any severe reactions post vaccine: [name] Difficulty breathing  . Swelling of face and throat  . A fast heartbeat  . A bad rash all over body  . Dizziness and weakness

## 2020-08-22 DIAGNOSIS — N1832 Chronic kidney disease, stage 3b: Secondary | ICD-10-CM | POA: Diagnosis not present

## 2020-08-22 DIAGNOSIS — E782 Mixed hyperlipidemia: Secondary | ICD-10-CM | POA: Diagnosis not present

## 2020-08-22 DIAGNOSIS — J449 Chronic obstructive pulmonary disease, unspecified: Secondary | ICD-10-CM | POA: Diagnosis not present

## 2020-08-22 DIAGNOSIS — Z23 Encounter for immunization: Secondary | ICD-10-CM | POA: Diagnosis not present

## 2020-08-22 DIAGNOSIS — E1122 Type 2 diabetes mellitus with diabetic chronic kidney disease: Secondary | ICD-10-CM | POA: Diagnosis not present

## 2020-08-29 NOTE — Progress Notes (Signed)
CARDIOLOGY OFFICE NOTE  Date:  09/12/2020    [name] Date of Birth: 1935-06-24 Medical Record #[id number]  PCP:  Celene Squibb, MD  Cardiologist:  Servando Snare (Former Domenic Polite)  Chief Complaint  Patient presents with  . Follow-up    History of Present Illness: [name] is a 85 y.o. male who presents today for a follow up visit.  Former patient of Dr. Maren Beach. Last seen in May of 2014 by Dr. Domenic Polite. Basically follows with me.   He has known CAD with no prior history of PCI. Other issues include colon cancer, HTN, GERD, HLD, PVD and OSA.  Remote stress test from 2008.  Reported echocardiogram from April 2011 noted upper normal LV wall thickness with LVEF 24-58%, grade 2 diastolic dysfunction, trivial aortic regurgitation, mild biatrial enlargement.   I saw him back in October of 2017 after a 3 year absence - he was needing left hand surgery and needing clearance. He had not been back here "because nothing is wrong with me". He was off most of his medicines other than Inderal and his med list did not match his PCP's. BP high here and at home. No symptoms. I put him on Norvasc - I was not convinced that he would stay on long term. His history is always a little hard to follow.   Last seen in December of last year - was doing ok. Had lost some weight with the pandemic.   Comes in today. Here alone. He has had a good year. Still working at the [employer]. No chest pain. Not short of breath. Labs from September noted. To go back to see PCP soon as well. BP is good. Needs refills today. Overall he feels like he is ok. More concerned about his wife - she has had some recent heart issues.   Past Medical History:  Diagnosis Date  . B12 deficiency 02/07/2014  . Colon cancer ([place])   . 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 Essential hypertension, benign   . GERD  (gastroesophageal reflux disease)   . Mixed hyperlipidemia   . Obstructive sleep apnea    does not use  . Prostate cancer ([place])   . PVD (peripheral vascular disease) ([place])   . Type 2 diabetes mellitus ([place])     Past Surgical History:  Procedure Laterality Date  . BACK SURGERY    . BALLOON DILATION N/A 08/27/2013   Procedure: BALLOON DILATION;  Surgeon: Rogene Houston, MD;  [facility];  Service: Endoscopy;  Laterality: N/A;  . COLON SURGERY    . COLONOSCOPY N/A 12/17/2013   Procedure: COLONOSCOPY;  Surgeon: Rogene Houston, MD;  [facility];  Service: Endoscopy;  Laterality: N/A;  940  . COLONOSCOPY N/A 02/28/2014   Procedure: COLONOSCOPY;  Surgeon: Rogene Houston, MD;  [facility];  Service: Endoscopy;  Laterality: N/A;  730  . COLONOSCOPY N/A 08/04/2015   Procedure: COLONOSCOPY;  Surgeon: Rogene Houston, MD;  [facility];  Service: Endoscopy;  Laterality: N/A;  200 - moved to 11/11 @ 2:10 - Ann to notify pt  . COLONOSCOPY WITH ESOPHAGOGASTRODUODENOSCOPY (EGD) N/A 08/27/2013   Procedure: COLONOSCOPY WITH ESOPHAGOGASTRODUODENOSCOPY (EGD);  Surgeon: Rogene Houston, MD;  [facility];  Service: Endoscopy;  Laterality: N/A;  730  . IR REMOVAL TUN ACCESS W/ PORT W/O FL  MOD SED  07/17/2018  . KNEE ARTHROSCOPY Left   . KNEE SURGERY Right    total knee  . MALONEY DILATION N/A 08/27/2013   Procedure: Venia Minks DILATION;  Surgeon: Rogene Houston, MD;  [facility];  Service: Endoscopy;  Laterality: N/A;  . PORTACATH PLACEMENT Right 2014  . PROSTATECTOMY    . removal of port Right   . SAVORY DILATION N/A 08/27/2013   Procedure: SAVORY DILATION;  Surgeon: Rogene Houston, MD;  [facility];  Service: Endoscopy;  Laterality: N/A;  . SHOULDER SURGERY    . TONSILLECTOMY    . VASECTOMY       Medications: Current Meds  Medication Sig  . Accu-Chek FastClix Lancets MISC Apply topically.  [name] ACCU-CHEK GUIDE test  strip   . acetaminophen (TYLENOL) 325 MG tablet Take 650 mg by mouth every 4 (four) hours as needed for mild pain or moderate pain. Take 2 tablets 1 hour prior to Rituxan treatment.  [name] albuterol (PROAIR HFA) 108 (90 Base) MCG/ACT inhaler Inhale 2 puffs into the lungs at bedtime as needed for wheezing or shortness of breath.  [name] amoxicillin-clavulanate (AUGMENTIN) 875-125 MG tablet Take 1 tablet by mouth 2 (two) times daily.  [name] atorvastatin (LIPITOR) 20 MG tablet Take 20 mg by mouth daily.  . budesonide-formoterol (SYMBICORT) 160-4.5 MCG/ACT inhaler Inhale 2 puffs into the lungs 2 (two) times daily.  [name] docusate sodium (COLACE) 100 MG capsule Take 200 mg by mouth daily as needed for mild constipation.  . fluticasone (FLONASE) 50 MCG/ACT nasal spray Place 1 spray into both nostrils as needed for allergies or rhinitis.  [name] gabapentin (NEURONTIN) 300 MG capsule Take 1 capsule (300 mg total) by mouth 2 (two) times daily. For pain in the feet.  [name] glipiZIDE (GLUCOTROL) 10 MG tablet Take 10 mg by mouth 2 (two) times daily before a meal.   . hydrocortisone 2.5 % cream Apply 1 application topically as needed (itching).  . insulin lispro (HUMALOG) 100 UNIT/ML cartridge Inject 1-10 Units into the skin as directed. On sliding scale when eating a big meal  . ipratropium (ATROVENT) 0.06 % nasal spray Place 2 sprays into both nostrils 2 (two) times daily.   . meclizine (ANTIVERT) 25 MG tablet Take 25 mg by mouth 2 (two) times daily as needed for dizziness.   . neomycin-polymyxin b-dexamethasone (MAXITROL) 3.5-10000-0.1 OINT   . ondansetron (ZOFRAN) 4 MG tablet Take by mouth as needed.   . propranolol ER (INDERAL LA) 60 MG 24 hr capsule Take 1 capsule (60 mg total) by mouth at bedtime. For tremor.  . temazepam (RESTORIL) 15 MG capsule Take 15 mg by mouth at bedtime as needed.  . testosterone cypionate (DEPOTESTOSTERONE CYPIONATE) 200 MG/ML injection Inject 200 mg into the muscle every 14 (fourteen) days.   Nelva Nay  SOLOSTAR 300 UNIT/ML SOPN Inject 70 Units into the skin every morning.   . [DISCONTINUED] amLODipine (NORVASC) 5 MG tablet Take 1 tablet (5 mg total) by mouth daily. Please keep upcoming appointment in Dec 2022 for future refills. Thank you     Allergies: Allergies  Allergen Reactions  . Bee Venom Anaphylaxis  . Adhesive [Tape] Hives  . Ciprofloxacin     Unknown   . Codeine     Unknown    . Iodine     Unknown    . Latex     Unknown    . Povidone-Iodine     Unknown    . Simvastatin  Unknown      Social History: The patient  reports that he has never smoked. He has never used smokeless tobacco. He reports that he does not drink alcohol and does not use drugs.   Family History: The patient's family history includes Cancer in his brother and brother; Colon cancer in his brother; Diabetes in his father.   Review of Systems: Please see the history of present illness.   All other systems are reviewed and negative.   Physical Exam: VS:  BP 120/80   Pulse (!) 58   Ht 5' 11"  (1.803 m)   Wt 229 lb 3.2 oz (104 kg)   SpO2 95%   BMI 31.97 kg/m  .  BMI Body mass index is 31.97 kg/m.  Wt Readings from Last 3 Encounters:  09/12/20 229 lb 3.2 oz (104 kg)  07/04/20 232 lb 8 oz (105.5 kg)  05/12/20 223 lb 3.2 oz (101.2 kg)    General: Pleasant. Alert and in no acute distress.   Cardiac: Regular rate and rhythm. No murmurs, rubs, or gallops. No edema.  Respiratory:  Lungs are clear to auscultation bilaterally with normal work of breathing.  GI: Soft and nontender.  MS: No deformity or atrophy. Gait and ROM intact.  Skin: Warm and dry. Color is normal.  Neuro:  Strength and sensation are intact and no gross focal deficits noted.  Psych: Alert, appropriate and with normal affect.   LABORATORY DATA:  EKG:  EKG is ordered today.  Personally reviewed by me. This demonstrates sinus bradycardia - prior inferior infarct - he has had this on prior tracing.  Lab Results   Component Value Date   WBC 8.5 07/17/2018   HGB 16.0 07/17/2018   HCT 50.9 07/17/2018   PLT 137 (L) 07/17/2018   GLUCOSE 230 (H) 02/23/2018   CHOL 195 05/14/2010   TRIG 115.0 05/14/2010   HDL 55.40 05/14/2010   LDLCALC 117 (H) 05/14/2010   ALT 17 02/23/2018   AST 19 02/23/2018   NA 140 02/23/2018   K 4.1 02/23/2018   CL 105 02/23/2018   CREATININE 1.52 (H) 02/23/2018   BUN 31 (H) 02/23/2018   CO2 28 02/23/2018   TSH 2.030 10/17/2015   PSA <0.01 11/28/2016   INR 0.94 07/17/2018       BNP (last 3 results) No results for input(s): BNP in the last 8760 hours.  ProBNP (last 3 results) No results for input(s): PROBNP in the last 8760 hours.   Other Studies Reviewed Today:  CT CHEST IMPRESSION 08/2016: No lesion is seen corresponding to the area of concern over the left lower hemithorax seen on the scout tomogram of most recent study. There is currently no chest wall lesion. There is no new parenchymal lung lesion. The largest ground-glass nodular lesions seen on previous study is smaller currently. As this lesion does persist, recommendations from CT examination September 2017 remain in effect. No new nodular opacities. No adenopathy. There is atherosclerotic calcification at multiple sites including multiple foci of coronary artery calcification.     Electronically Signed   By: Lowella Grip III M.D.   On: 09/09/2016 09:25   CARDIAC CATH CONCLUSION 2008:  1. Mild narrowing in the mid left anterior descending artery which      could represent either segmental plaquing or perhaps even a      myocardial bridge.  2. Large normal ramus intermedius vessel.  3. 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  4. Smooth right coronary artery.   DISPOSITION:  Despite the patient's longstanding diabetes, the coronary  vasculature is really quite smooth.  I would doubt that any of his  symptoms are explained by the current  coronary findings.  We will talk  with his primary care physician about current status.   Loretha Brasil. Lia Foyer, MD, [facility]    Assessment/Plan:  1. CAD - remote cath - no active symptoms. No changes made today.   2. HTN - BP is fine. Norvasc refilled today.   3. HLD - on statin - lab by PCP  4. Obesity - encouragement given.   Current medicines are reviewed with the patient today.  The patient does not have concerns regarding medicines other than what has been noted above.  The following changes have been made:  See above.  Labs/ tests ordered today include:    Orders Placed This Encounter  Procedures  . EKG 12-Lead     Disposition:   FU with Dr. Johney Frame in one year. He is aware that I am leaving in February.    Patient is agreeable to this plan and will call if any problems develop in the interim.   SignedTruitt Merle, NP  09/12/2020 9:34 AM  [facility] [address] [place], [place]  [address] Phone: [phone] Fax: [phone]

## 2020-09-08 ENCOUNTER — Other Ambulatory Visit: Payer: Self-pay | Admitting: Nurse Practitioner

## 2020-09-12 ENCOUNTER — Ambulatory Visit: Payer: Medicare Other | Admitting: Nurse Practitioner

## 2020-09-12 ENCOUNTER — Other Ambulatory Visit: Payer: Self-pay

## 2020-09-12 ENCOUNTER — Encounter: Payer: Self-pay | Admitting: Nurse Practitioner

## 2020-09-12 VITALS — BP 120/80 | HR 58 | Ht 71.0 in | Wt 229.2 lb

## 2020-09-12 DIAGNOSIS — I10 Essential (primary) hypertension: Secondary | ICD-10-CM

## 2020-09-12 DIAGNOSIS — E7800 Pure hypercholesterolemia, unspecified: Secondary | ICD-10-CM | POA: Diagnosis not present

## 2020-09-12 DIAGNOSIS — I259 Chronic ischemic heart disease, unspecified: Secondary | ICD-10-CM | POA: Diagnosis not present

## 2020-09-12 MED ORDER — AMLODIPINE BESYLATE 5 MG PO TABS: 5.0000 mg | ORAL_TABLET | Freq: Every day | ORAL | 3 refills | Status: DC

## 2020-09-12 NOTE — Patient Instructions (Addendum)
After Visit Summary:  We will be checking the following labs today - NONE   Medication Instructions:    Continue with your current medicines.   I refilled your medicines today.    If you need a refill on your cardiac medications before your next appointment, please call your pharmacy.     Testing/Procedures To Be Arranged:  N/A  Follow-Up:   See Dr. Gwyndolyn Kaufman in one year.  You will receive a reminder letter in the mail two months in advance. If you don't receive a letter, please call our office to schedule the follow-up appointment.     At [facility], you and your health needs are our priority.  As part of our continuing mission to provide you with exceptional heart care, we have created designated Provider Care Teams.  These Care Teams include your primary Cardiologist (physician) and Advanced Practice Providers (APPs -  Physician Assistants and Nurse Practitioners) who all work together to provide you with the care you need, when you need it.  Special Instructions:  . Stay safe, wash your hands for at least 20 seconds and wear a mask when needed.  . It was good to talk with you today.    Call the [place] office at [phone] if you have any questions, problems or concerns.

## 2021-02-12 ENCOUNTER — Ambulatory Visit: Payer: Medicare Other | Admitting: Neurology

## 2021-02-15 ENCOUNTER — Encounter: Payer: Self-pay | Admitting: Adult Health

## 2021-02-15 ENCOUNTER — Ambulatory Visit: Payer: Medicare Other | Admitting: Adult Health

## 2021-02-15 VITALS — BP 143/83 | HR 65 | Ht 71.0 in | Wt 228.0 lb

## 2021-02-15 DIAGNOSIS — G3184 Mild cognitive impairment, so stated: Secondary | ICD-10-CM

## 2021-02-15 DIAGNOSIS — G629 Polyneuropathy, unspecified: Secondary | ICD-10-CM

## 2021-02-15 DIAGNOSIS — G250 Essential tremor: Secondary | ICD-10-CM

## 2021-02-15 MED ORDER — GABAPENTIN 300 MG PO CAPS: ORAL_CAPSULE | ORAL | 3 refills | Status: DC

## 2021-02-15 MED ORDER — PROPRANOLOL HCL ER 120 MG PO CP24: 120.0000 mg | ORAL_CAPSULE | Freq: Every day | ORAL | 3 refills | Status: DC

## 2021-02-15 NOTE — Progress Notes (Addendum)
[facility]  Provider:  Dr Jaynee Eagles Referring Provider: Celene Squibb, MD Primary Care Physician:  Wende Neighbors, MD  CC:  Chief Complaint  Patient presents with  . Follow-up    Rm 14 laone Pt is well, states tremors has gotten a little worse. L foot feels like bee's are stinging him. Went to a Child psychotherapist and they recommended medication increase.      HPI:  Today, 02/15/2021, [name] returns for yearly follow-up unaccompanied   Tremors: Remains on propranolol ER 24m daily tolerating without side effects.  He does report tremors have slightly worsened and can interfere with his writing.  Continued R>L UE - denies tremors elsewhere.   Neuropathy: On gabapentin tolerating without side effects.  Slight increase in left foot paresthesias -reports he recently seen his podiatrist who advised him to discuss possible dosage increase of gabapentin -at prior visit, we discussed increasing dose from 300 mg daily to 300 mg twice daily but per pt, he just started taking 3056mAM and increased PM dose from 30024mo 600m68mst week and has noticed some benefit.  He does have history of B12 deficiency not currently on supplementation -he is unsure if vitamin B levels have been checked recently  MCI: Stable    History provided for reference purposes only Update 02/10/2020 JM: Mr. StonLauraurns for yearly follow-up regarding tremors, cognitive impairment and peripheral neuropathy management and medication refills.  On propranolol ER 60 mg daily with benefit in regards to tremors.  Tolerating well without hypotension, bradycardia or any other side effects.  Cognition has been stable without worsening.  Declines memory testing.  Neuropathy has improved some on gabapentin 300 mg daily.  Tolerating dosage well.  He is wondering if there is something better for him to try. Blood pressure today 126/82.  No concerns at this time.  Interval History via virtual visit 01/14/2019 Dr. AherJaynee Eagles takes  40mg26mpranolol once a day and he is happy but gets tremulous later in the day.We discussed it is a 2-3x a day medication but he doesn;t want to take medication multiple times a day. We can change him to ER 60mg 68msee if that helps for his tremor. His memory is stable.  He gets shakes later. Advised he could take 40mg t52m daily he prefers 60mg ER72mop IR. His feet hurt, he is not taking the gabapentin. Explained this helps with tremor as well. Discussed side effects.  Interval history 11/17/2017 Dr. Ahern: PJaynee Eaglest is here for follow up of tremor on propranolol as well as memory changes. He did not tolerate Aricept. He does well on propranolol.  Also neuropathy is stable. He has been sick, congestion and cough and in his lungs. He is on prednisone, he is feeling a little better. Propranolol is helping. Memory is stable. He has to take 40mg som4mys because he shales so much, no side effects if he take 40mg 3-4 65ms a day, we can increase his dosage to 40mg and h36mderstands he can cut it in half if needed. His neuropathy is stable. His memory is stable. His tremor is a little worse, will increase propranolol and reviewed side effects and when he should stop. Memory is stable. Taking B12.   Interval history 05/19/2017 Dr. Ahern: PropJaynee Eaglesol is still helping. He feels his memory is stable.  He takes his own medications and doesn;t miss any, he pays his bills every month and not missing pills or double paying, no difficulties driving, no accidents in  the home. Not getting lost. He knows the date every day, he reads the newspaper every day, he walks, he socializes with his family sees family every day. He is still taking B12 supplement. He worries about things, he worries about mowing the yard because he runs out of time. He works on [employer] and [employer]. Neuropathy is stable in his feet, has burning which is chronic and stable. Recommend daily ASA for stroke prevention. He hydrates well. He  sometimes feels lightheaded for the last 2-3 months, takes propranolol 3x a day recommend maybe decreasing to twice a day and if symptoms of lightheadedness/dizzy get worse call us. He has had hypoglycemia and was seen in the ED and this could be the problem as well.   Interval history for tremor 11/04/2016 Dr. Jaynee Eagles: Patient is here for follow up on tremor on propranolol. He is fine taking the propranolol 3x a day. He can take it up to 4x a day as needed. Memory is stable. Didn't tolerate the Aricept. Discussed the tremor and decided to increase as tolerated and needed. Discussed other medications other than aricept but he declines we will just   Interval history 05/06/2016:[name] is a 86 y.o. [name] here as a referral from Dr. [place] Crane for tremor. PMHx CKD, Type 2 DM, HLD, HTN, insomnia, OSA, depression, polyneuropathy, cerebral ischemia, b12 deficiency, mantle cell lymphoma, anxiety, parkinson's diseas (?). He goes by the name [name]. Had side effects with primidone and neurontin. He has been taking 23m propranolol three times a day. It has helped with the tremor and blood pressure has not been low and no problems taking the medication 3x a day.   MRI of the brain was unremarkable for age: IMPRESSION: Generalized cortical atrophy and chronic microvascular ischemicchanges, typical for age. No acute abnormality.  Interval history 01/22/2016: primidone and neurontin had side effects. Discussed propranolol and that this is heart medication, we need to be very mindful of his pulse and blood pressure and this can cause significant decreases in both, he should take his blood pressure and pulse daily, do not take if BP less than 11/60 or pulse < 60, stop for anything concerning whatsoever including SOB, CP. Discussed his memory, He forgetting conversation, things wife tells you, people's names, no dementia in the family. Been going on slowly progressive for at least 2 years. He takes b12 shots. He used  to have a cpap and stopped. He is tired during the day. He snores, his wife doesn't. Last sleep study was 4-5 years ago. He is Morbidly obese. Epworth sleep scale only 5/10, doesn't appear to be candidate for sleep stufy. Discussed neurocognitive testing as well.  HPI: Brandon SCHILLis a 86y.o. [name] here as a referral from Dr. [place] Cranefor tremor. PMHx CKD, Type 2 DM, HLD, HTN, insomnia, OSA, depression, polyneuropathy, cerebral ischemia, b12 deficiency, mantle cell lymphoma, anxiety, parkinson's diseas (?). He goes by the name [name] Tremor has been going on for over a year. Worsening. Some days he doesn't have it. Today is a good day. When he has it, it is continuous, both resting and postural. He is weak all over, he has pain all over, he went to physical therapy and improved. He has been taking carbidopa/levodopa and it has not helped the tremor, [place]. Getting worse. He has a difficult time writing mostly. No difficulty eating, denies excessive coffee. Nothing makes it better or  unknown. Grandmother had a tremor, she had it severely and also her head shook and she shook all over. Sister has a similar tremor. He has trouble turning pages due to the tremor. Slowly progressive. No other focal neurologic deficits.     Review of Systems: Patient complains of symptoms per HPI as well as the following symptoms: Numbness, tingling, tremors.  Pertinent negatives per HPI. All others negative.   Social History   Socioeconomic History  . Marital status: Married    Spouse name: [name]  . Number of children: 2  . Years of education: 9th grade  . Highest education level: Not on file  Occupational History  . Occupation: Retired     Fish farm manager: RETIRED  Tobacco Use  . Smoking status: Never Smoker  . Smokeless tobacco: Never Used  Vaping Use  . Vaping Use: Never used  Substance and Sexual Activity  . Alcohol use: No  . Drug use:  No  . Sexual activity: Yes    Birth control/protection: None  Other Topics Concern  . Not on file  Social History Narrative   Lives with wife   Caffeine use: coffee 1 cup some days of the week. Update 02/10/2020 ("very rarely though")   Right-handed   Social Determinants of Health   Financial Resource Strain: Not on file  Food Insecurity: Not on file  Transportation Needs: Not on file  Physical Activity: Not on file  Stress: Not on file  Social Connections: Not on file  Intimate Partner Violence: Not on file    Family History  Problem Relation Age of Onset  . Colon cancer Brother   . Cancer Brother   . Diabetes Father   . Cancer Brother     Past Medical History:  Diagnosis Date  . B12 deficiency 02/07/2014  . Colon cancer ([place])   . 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 Essential hypertension, benign   . GERD (gastroesophageal reflux disease)   . Mixed hyperlipidemia   . Obstructive sleep apnea    does not use  . Prostate cancer ([place])   . PVD (peripheral vascular disease) ([place])   . Type 2 diabetes mellitus ([place])     Past Surgical History:  Procedure Laterality Date  . BACK SURGERY    . BALLOON DILATION N/A 08/27/2013   Procedure: BALLOON DILATION;  Surgeon: Rogene Houston, MD;  [facility];  Service: Endoscopy;  Laterality: N/A;  . COLON SURGERY    . COLONOSCOPY N/A 12/17/2013   Procedure: COLONOSCOPY;  Surgeon: Rogene Houston, MD;  [facility];  Service: Endoscopy;  Laterality: N/A;  940  . COLONOSCOPY N/A 02/28/2014   Procedure: COLONOSCOPY;  Surgeon: Rogene Houston, MD;  [facility];  Service: Endoscopy;  Laterality: N/A;  730  . COLONOSCOPY N/A 08/04/2015   Procedure: COLONOSCOPY;  Surgeon: Rogene Houston, MD;  [facility];  Service: Endoscopy;  Laterality: N/A;  200 - moved to 11/11 @ 2:10 - Ann to notify pt  .  COLONOSCOPY WITH ESOPHAGOGASTRODUODENOSCOPY (EGD) N/A 08/27/2013   Procedure: COLONOSCOPY WITH ESOPHAGOGASTRODUODENOSCOPY (EGD);  Surgeon: Rogene Houston, MD;  [facility];  Service: Endoscopy;  Laterality: N/A;  730  . IR REMOVAL TUN ACCESS W/ PORT W/O FL MOD SED  07/17/2018  . KNEE ARTHROSCOPY Left   . KNEE SURGERY Right    total knee  .  MALONEY DILATION N/A 08/27/2013   Procedure: Venia Minks DILATION;  Surgeon: Rogene Houston, MD;  [facility];  Service: Endoscopy;  Laterality: N/A;  . PORTACATH PLACEMENT Right 2014  . PROSTATECTOMY    . removal of port Right   . SAVORY DILATION N/A 08/27/2013   Procedure: SAVORY DILATION;  Surgeon: Rogene Houston, MD;  [facility];  Service: Endoscopy;  Laterality: N/A;  . SHOULDER SURGERY    . TONSILLECTOMY    . VASECTOMY      Current Outpatient Medications  Medication Sig Dispense Refill  . Accu-Chek FastClix Lancets MISC Apply topically.    [name] ACCU-CHEK GUIDE test strip     . acetaminophen (TYLENOL) 325 MG tablet Take 650 mg by mouth every 4 (four) hours as needed for mild pain or moderate pain. Take 2 tablets 1 hour prior to Rituxan treatment.    [name] albuterol (PROAIR HFA) 108 (90 Base) MCG/ACT inhaler Inhale 2 puffs into the lungs at bedtime as needed for wheezing or shortness of breath. 18 g 1  . amLODipine (NORVASC) 5 MG tablet Take 1 tablet (5 mg total) by mouth daily. 90 tablet 3  . amoxicillin-clavulanate (AUGMENTIN) 875-125 MG tablet Take 1 tablet by mouth 2 (two) times daily.    [name] atorvastatin (LIPITOR) 20 MG tablet Take 20 mg by mouth daily.    . budesonide-formoterol (SYMBICORT) 160-4.5 MCG/ACT inhaler Inhale 2 puffs into the lungs 2 (two) times daily. 1 each 11  . docusate sodium (COLACE) 100 MG capsule Take 200 mg by mouth daily as needed for mild constipation.    . fluticasone (FLONASE) 50 MCG/ACT nasal spray Place 1 spray into both nostrils as needed for allergies or rhinitis.    [name] glipiZIDE  (GLUCOTROL) 10 MG tablet Take 10 mg by mouth 2 (two) times daily before a meal.     . hydrocortisone 2.5 % cream Apply 1 application topically as needed (itching).    . insulin lispro (HUMALOG) 100 UNIT/ML cartridge Inject 1-10 Units into the skin as directed. On sliding scale when eating a big meal    . ipratropium (ATROVENT) 0.06 % nasal spray Place 2 sprays into both nostrils 2 (two) times daily.     . meclizine (ANTIVERT) 25 MG tablet Take 25 mg by mouth 2 (two) times daily as needed for dizziness.     . neomycin-polymyxin b-dexamethasone (MAXITROL) 3.5-10000-0.1 OINT     . ondansetron (ZOFRAN) 4 MG tablet Take by mouth as needed.     . temazepam (RESTORIL) 15 MG capsule Take 15 mg by mouth at bedtime as needed.    . testosterone cypionate (DEPOTESTOSTERONE CYPIONATE) 200 MG/ML injection Inject 200 mg into the muscle every 14 (fourteen) days.     Nelva Nay SOLOSTAR 300 UNIT/ML SOPN Inject 70 Units into the skin every morning.     . gabapentin (NEURONTIN) 300 MG capsule Take 1 cap in AM and 2 caps at night (300AM, 600PM) 270 capsule 3  . pantoprazole (PROTONIX) 40 MG tablet TAKE 1 TABLET BY MOUTH 2 TIMES DAILY BEFORE A MEAL. 180 tablet 0  . propranolol ER (INDERAL LA) 120 MG 24 hr capsule Take 1 capsule (120 mg total) by mouth at bedtime. For tremor. 90 capsule 3   No current facility-administered medications for this visit.   Facility-Administered Medications Ordered in Other Visits  Medication Dose Route Frequency Provider Last Rate Last Admin  . heparin lock flush 100 unit/mL  500 Units Intravenous Once Kefalas, Manon Hilding, PA-C      .  sodium chloride flush (NS) 0.9 % injection 20 mL  20 mL Intravenous PRN Baird Cancer, PA-C        Allergies as of 02/15/2021 - Review Complete 02/15/2021  Allergen Reaction Noted  . Bee venom Anaphylaxis   . Adhesive [tape] Hives   . Ciprofloxacin    . Codeine    . Iodine    . Latex    . Povidone-iodine  10/15/2010  . Simvastatin       Vitals: Today's Vitals   02/15/21 1032  BP: (!) 143/83  Pulse: 65  Weight: 228 lb (103.4 kg)  Height: 5' 11"  (1.803 m)   Body mass index is 31.8 kg/m.  PHYSICAL EXAM:   Cranial Nerves:  The pupils are equal, round, and reactive to light. Visual fields are full to finger confrontation. Extraocular movements are intact. Trigeminal sensation is intact and the muscles of mastication are normal. The face is symmetric. The palate elevates in the midline. Hearing intact. Voice is normal. Shoulder shrug is normal. The tongue has normal motion without fasciculations.   Coordination:  Normal finger to nose and heel to shin.   Gait:  Mildly wide based  Motor Observation:  No asymmetry, no atrophy.  High-frequency low amplitude tremor R>L UE with postural and action components.  No evidence of tremor at rest, cogwheel rigidity or bradykinesia  Tone:  Normal muscle tone.   Posture:  Posture is normal. normal erect   Strength:  Strength is V/V in the upper and lower limbs.    Sensation: dec pin prick, toes to the knees. Absent vibration at the toes but equal at ankles and knees.    Reflex Exam:  DTR's:  Deep tendon reflexes in the upper and lower extremities are hypo bilaterally.  Toes:  The toes are downgoing bilaterally.  Clonus:  Clonus is absent.    Assessment/Plan:Very pleasant 86 year old [name] with what appears to be a familial essential tremor, memory loss (mild cogniitve impairment) and peripheral neuropathy.  Family history of tremor.  No evidence of Parkinson's or parkinsonism.   - Tremor  Slight worsening over the past year Increase propranolol ER to 120 mg daily -discussed potential side effects and importance of routinely monitoring blood pressure and heart rate -he wishes to further increase dosage and verbalized understanding of potential side effects.  He was advised to call with any difficulty tolerating or if  no benefit after 1 to 2 weeks -Prior intolerance to primidone  -Peripheral neuropathy:  Slight worsening LLE Increase gabapentin to 300 mg AM and 625m PM -updated prescription provided Lab work: B12, B1, B6, thyroid panel with TSH  - Memory loss: Stable Continue to monitor   Follow-up in 6 months or call earlier if needed  Orders Placed This Encounter  Procedures  . Vitamin B12  . Vitamin B1  . Vitamin B6  . Thyroid Panel With TSH   Meds ordered this encounter  Medications  . propranolol ER (INDERAL LA) 120 MG 24 hr capsule    Sig: Take 1 capsule (120 mg total) by mouth at bedtime. For tremor.    Dispense:  90 capsule    Refill:  3  . gabapentin (NEURONTIN) 300 MG capsule    Sig: Take 1 cap in AM and 2 caps at night (300AM, 600PM)    Dispense:  270 capsule    Refill:  3    CC:  [facility] provider: Dr. ATana Coast JEdwinna Areola MD     JFrann Rider AGNP-BC  [facility] [address] [place], [place] [address]  Phone [phone] Fax [phone] Note: This document was prepared with digital dictation and possible smart phrase technology. Any transcriptional errors that result from this process are unintentional.  Made any corrections needed, and agree with history, physical, neuro exam,assessment and plan as stated.     Sarina Ill, MD [facility]   agree with assessment and plan as stated.     Sarina Ill, MD [facility]

## 2021-02-15 NOTE — Patient Instructions (Addendum)
Your Plan:  For tremor: Increase propranolol to 110m nightly - please ensure you continue to monitor your blood pressure and heart rate and let me know if you have any difficulty tolerating or no benefit after 1-2 weeks  For neuropathy: Increase gabapentin to 3061m(1 cap) AM and 600104m2 cap) PM - please keep me updated if you continue to experience painful symptoms. We will also check lab work today to look for underlying factors that could potentially be contributing to your worsening symptoms - you will be able to see the results on your MyChart - we will not be back in the office until Tuesday and I will let you know at that time if there are any concerns regarding the results    Follow-up in 6 months or call earlier if needed     Thank you for coming to see us [facility]. I hope we have been able to provide you high quality care today.  You may receive a patient satisfaction survey over the next few weeks. We would appreciate your feedback and comments so that we may continue to improve ourselves and the health of our patients.

## 2021-02-26 ENCOUNTER — Telehealth: Payer: Self-pay

## 2021-02-26 LAB — VITAMIN B12: Vitamin B-12: 1192 pg/mL (ref 232–1245)

## 2021-02-26 LAB — THYROID PANEL WITH TSH
Free Thyroxine Index: 1.8 (ref 1.2–4.9)
T3 Uptake Ratio: 33 % (ref 24–39)
T4, Total: 5.5 ug/dL (ref 4.5–12.0)
TSH: 2.97 u[IU]/mL (ref 0.450–4.500)

## 2021-02-26 LAB — VITAMIN B1: Thiamine: 105.4 nmol/L (ref 66.5–200.0)

## 2021-02-26 LAB — VITAMIN B6: Vitamin B6: 11.3 ug/L (ref 3.4–65.2)

## 2021-02-26 NOTE — Telephone Encounter (Signed)
Contacted pt to inform him that B12 level and thyroid panel within normal limits. Still waiting on results of B6 and thiamine - if abnormal, we will let him know. Advised to call the office with questions as he had none at the time, he understood.

## 2021-02-26 NOTE — Telephone Encounter (Signed)
-----   Message from Frann Rider, NP sent at 02/22/2021  4:37 PM EDT ----- Please advise patient that B12 level and thyroid panel within normal limits.  Still waiting on results of B6 and thiamine - if abnormal, we will let him know. Thank you!

## 2021-03-07 DIAGNOSIS — E1142 Type 2 diabetes mellitus with diabetic polyneuropathy: Secondary | ICD-10-CM | POA: Insufficient documentation

## 2021-03-14 DIAGNOSIS — R809 Proteinuria, unspecified: Secondary | ICD-10-CM | POA: Insufficient documentation

## 2021-04-05 DIAGNOSIS — E782 Mixed hyperlipidemia: Secondary | ICD-10-CM | POA: Diagnosis not present

## 2021-04-05 DIAGNOSIS — R809 Proteinuria, unspecified: Secondary | ICD-10-CM | POA: Diagnosis not present

## 2021-04-05 DIAGNOSIS — K219 Gastro-esophageal reflux disease without esophagitis: Secondary | ICD-10-CM | POA: Diagnosis not present

## 2021-04-05 DIAGNOSIS — I10 Essential (primary) hypertension: Secondary | ICD-10-CM | POA: Diagnosis not present

## 2021-04-05 DIAGNOSIS — J449 Chronic obstructive pulmonary disease, unspecified: Secondary | ICD-10-CM | POA: Diagnosis not present

## 2021-04-05 DIAGNOSIS — C8310 Mantle cell lymphoma, unspecified site: Secondary | ICD-10-CM | POA: Diagnosis not present

## 2021-04-05 DIAGNOSIS — N1832 Chronic kidney disease, stage 3b: Secondary | ICD-10-CM | POA: Diagnosis not present

## 2021-04-05 DIAGNOSIS — E1122 Type 2 diabetes mellitus with diabetic chronic kidney disease: Secondary | ICD-10-CM | POA: Diagnosis not present

## 2021-04-05 DIAGNOSIS — D696 Thrombocytopenia, unspecified: Secondary | ICD-10-CM | POA: Diagnosis not present

## 2021-04-05 DIAGNOSIS — I2510 Atherosclerotic heart disease of native coronary artery without angina pectoris: Secondary | ICD-10-CM | POA: Diagnosis not present

## 2021-05-01 DIAGNOSIS — R339 Retention of urine, unspecified: Secondary | ICD-10-CM | POA: Diagnosis not present

## 2021-05-01 DIAGNOSIS — N390 Urinary tract infection, site not specified: Secondary | ICD-10-CM | POA: Diagnosis not present

## 2021-05-01 DIAGNOSIS — N3000 Acute cystitis without hematuria: Secondary | ICD-10-CM | POA: Diagnosis not present

## 2021-05-04 ENCOUNTER — Encounter: Payer: Self-pay | Admitting: Emergency Medicine

## 2021-05-04 ENCOUNTER — Ambulatory Visit
Admission: EM | Admit: 2021-05-04 | Discharge: 2021-05-04 | Disposition: A | Discharge status: Home / Self Care | Payer: Medicare Other | Attending: Emergency Medicine | Admitting: Emergency Medicine

## 2021-05-04 ENCOUNTER — Other Ambulatory Visit: Payer: Self-pay

## 2021-05-04 ENCOUNTER — Other Ambulatory Visit: Payer: Self-pay | Admitting: Internal Medicine

## 2021-05-04 DIAGNOSIS — R300 Dysuria: Secondary | ICD-10-CM | POA: Diagnosis not present

## 2021-05-04 DIAGNOSIS — R918 Other nonspecific abnormal finding of lung field: Secondary | ICD-10-CM

## 2021-05-04 LAB — POCT URINALYSIS DIP (MANUAL ENTRY)
Bilirubin, UA: NEGATIVE
Glucose, UA: 100 mg/dL — AB
Ketones, POC UA: NEGATIVE mg/dL
Nitrite, UA: POSITIVE — AB
Protein Ur, POC: >=300 mg/dL — AB
Spec Grav, UA: >=1.03 — AB (ref 1.010–1.025)
Urobilinogen, UA: 1 E.U./dL
pH, UA: 5.5 (ref 5.0–8.0)

## 2021-05-04 MED ORDER — CEPHALEXIN 500 MG PO CAPS: 500.0000 mg | ORAL_CAPSULE | Freq: Two times a day (BID) | ORAL | 0 refills | Status: DC

## 2021-05-04 NOTE — Discharge Instructions (Addendum)
Urine concerning for UTI Urine culture sent.  We will call you with the results.   Push fluids and get plenty of rest.   Take antibiotic as directed and to completion Follow up with PCP for recheck and to ensure symptoms are improving Return here or go to ER if you have any new or worsening symptoms such as fever, worsening abdominal pain, nausea/vomiting, flank pain, etc..[name]

## 2021-05-04 NOTE — ED Triage Notes (Signed)
Pain with urination x 3 days.  States urine doesn't come out good.

## 2021-05-04 NOTE — ED Provider Notes (Signed)
[facility]   CC: Burning with urination  SUBJECTIVE:  [name] is a 86 y.o. male who complains of painful urination x 3 days.  Patient denies a precipitating event.  Denies abdominal or flank pain.  Has tried OTC medications without relief.  Symptoms are made worse with urination.  Admits to similar symptoms in the past.  Denies fever, chills, nausea, vomiting, abdominal pain, flank pain, hematuria.    LMP: No LMP for male patient.  ROS: As in HPI.  All other pertinent ROS negative.     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, anomalous circumflex - no PCIs   Essential hypertension, benign    GERD (gastroesophageal reflux disease)    Mixed hyperlipidemia    Obstructive sleep apnea    does not use   Prostate cancer ([place])    PVD (peripheral vascular disease) ([place])    Type 2 diabetes mellitus ([place])    Past Surgical History:  Procedure Laterality Date   BACK SURGERY     BALLOON DILATION N/A 08/27/2013   Procedure: BALLOON DILATION;  Surgeon: Rogene Houston, MD;  [facility];  Service: Endoscopy;  Laterality: N/A;   COLON SURGERY     COLONOSCOPY N/A 12/17/2013   Procedure: COLONOSCOPY;  Surgeon: Rogene Houston, MD;  [facility];  Service: Endoscopy;  Laterality: N/A;  940   COLONOSCOPY N/A 02/28/2014   Procedure: COLONOSCOPY;  Surgeon: Rogene Houston, MD;  [facility];  Service: Endoscopy;  Laterality: N/A;  730   COLONOSCOPY N/A 08/04/2015   Procedure: COLONOSCOPY;  Surgeon: Rogene Houston, MD;  [facility];  Service: Endoscopy;  Laterality: N/A;  200 - moved to 11/11 @ 2:10 - Ann to notify pt   COLONOSCOPY WITH ESOPHAGOGASTRODUODENOSCOPY (EGD) N/A 08/27/2013   Procedure: COLONOSCOPY WITH ESOPHAGOGASTRODUODENOSCOPY (EGD);  Surgeon: Rogene Houston, MD;  [facility];  Service: Endoscopy;  Laterality: N/A;  730   IR REMOVAL TUN ACCESS W/ PORT W/O FL MOD SED  07/17/2018   KNEE ARTHROSCOPY Left    KNEE SURGERY Right    total knee   MALONEY DILATION N/A 08/27/2013   Procedure: MALONEY DILATION;  Surgeon: Rogene Houston, MD;  [facility];  Service: Endoscopy;  Laterality: N/A;   PORTACATH PLACEMENT Right 2014   PROSTATECTOMY     removal of port Right    SAVORY DILATION N/A 08/27/2013   Procedure: SAVORY DILATION;  Surgeon: Rogene Houston, MD;  [facility];  Service: Endoscopy;  Laterality: N/A;   SHOULDER SURGERY     TONSILLECTOMY     VASECTOMY     Allergies  Allergen Reactions   Bee Venom Anaphylaxis   Adhesive [Tape] Hives   Ciprofloxacin     Unknown    Codeine     Unknown     Iodine     Unknown     Latex     Unknown     Povidone-Iodine     Unknown     Simvastatin     Unknown     Current Facility-Administered Medications on File Prior to Encounter  Medication Dose Route Frequency Provider Last Rate Last Admin   heparin lock flush 100 unit/mL  500 Units Intravenous Once Baird Cancer, PA-C  sodium chloride flush (NS) 0.9 % injection 20 mL  20 mL Intravenous PRN Baird Cancer, PA-C       Current Outpatient Medications on File Prior to Encounter  Medication Sig Dispense Refill   Accu-Chek FastClix Lancets MISC Apply topically.     ACCU-CHEK GUIDE test strip      acetaminophen (TYLENOL) 325 MG tablet Take 650 mg by mouth every 4 (four) hours as needed for mild pain or moderate pain. Take 2 tablets 1 hour prior to Rituxan treatment.     albuterol (PROAIR HFA) 108 (90 Base) MCG/ACT inhaler Inhale 2 puffs into the lungs at bedtime as needed for wheezing or shortness of breath. 18 g 1   amLODipine (NORVASC) 5 MG tablet Take 1 tablet (5 mg total) by mouth daily. 90 tablet 3   atorvastatin (LIPITOR) 20 MG tablet Take 20 mg by mouth daily.     budesonide-formoterol (SYMBICORT) 160-4.5 MCG/ACT  inhaler Inhale 2 puffs into the lungs 2 (two) times daily. 1 each 11   docusate sodium (COLACE) 100 MG capsule Take 200 mg by mouth daily as needed for mild constipation.     fluticasone (FLONASE) 50 MCG/ACT nasal spray Place 1 spray into both nostrils as needed for allergies or rhinitis.     gabapentin (NEURONTIN) 300 MG capsule Take 1 cap in AM and 2 caps at night (300AM, 600PM) 270 capsule 3   glipiZIDE (GLUCOTROL) 10 MG tablet Take 10 mg by mouth 2 (two) times daily before a meal.      hydrocortisone 2.5 % cream Apply 1 application topically as needed (itching).     insulin lispro (HUMALOG) 100 UNIT/ML cartridge Inject 1-10 Units into the skin as directed. On sliding scale when eating a big meal     ipratropium (ATROVENT) 0.06 % nasal spray Place 2 sprays into both nostrils 2 (two) times daily.      meclizine (ANTIVERT) 25 MG tablet Take 25 mg by mouth 2 (two) times daily as needed for dizziness.      neomycin-polymyxin b-dexamethasone (MAXITROL) 3.5-10000-0.1 OINT      ondansetron (ZOFRAN) 4 MG tablet Take by mouth as needed.      pantoprazole (PROTONIX) 40 MG tablet TAKE 1 TABLET BY MOUTH 2 TIMES DAILY BEFORE A MEAL. 180 tablet 0   propranolol ER (INDERAL LA) 120 MG 24 hr capsule Take 1 capsule (120 mg total) by mouth at bedtime. For tremor. 90 capsule 3   temazepam (RESTORIL) 15 MG capsule Take 15 mg by mouth at bedtime as needed.     testosterone cypionate (DEPOTESTOSTERONE CYPIONATE) 200 MG/ML injection Inject 200 mg into the muscle every 14 (fourteen) days.      TOUJEO SOLOSTAR 300 UNIT/ML SOPN Inject 70 Units into the skin every morning.      [DISCONTINUED] insulin aspart (NOVOLOG) 100 UNIT/ML injection Inject 20 Units into the skin 3 (three) times daily before meals.       [DISCONTINUED] rosuvastatin (CRESTOR) 40 MG tablet Take 40 mg by mouth daily.       Social History   Socioeconomic History   Marital status: Married    Spouse name: [name]   Number of children: 2   Years of  education: 9th grade   Highest education level: Not on file  Occupational History   Occupation: Retired     Fish farm manager: RETIRED  Tobacco Use   Smoking status: Never   Smokeless tobacco: Never  Vaping Use   Vaping Use: Never used  Substance and Sexual  Activity   Alcohol use: No   Drug use: No   Sexual activity: Yes    Birth control/protection: None  Other Topics Concern   Not on file  Social History Narrative   Lives with wife   Caffeine use: coffee 1 cup some days of the week. Update 02/10/2020 ("very rarely though")   Right-handed   Social Determinants of Radio broadcast assistant Strain: Not on file  Food Insecurity: Not on file  Transportation Needs: Not on file  Physical Activity: Not on file  Stress: Not on file  Social Connections: Not on file  Intimate Partner Violence: Not on file   Family History  Problem Relation Age of Onset   Colon cancer Brother    Cancer Brother    Diabetes Father    Cancer Brother     OBJECTIVE:  Vitals:   05/04/21 1653  BP: (!) 171/82  Pulse: 72  Resp: 18  Temp: 98 F (36.7 C)  TempSrc: Oral  SpO2: 92%   General appearance: AOx3 in no acute distress HEENT: NCAT.  Oropharynx clear.  Lungs: clear to auscultation bilaterally without adventitious breath sounds Heart: unable to assess due patient talking Abdomen: soft; non-distended; no tenderness; bowel sounds present; no guarding Back: no CVA tenderness Extremities: no edema; symmetrical with no gross deformities Skin: warm and dry Neurologic: Ambulates from chair to exam table without difficulty Psychological: alert and cooperative; normal mood and affect  Labs Reviewed  POCT URINALYSIS DIP (MANUAL ENTRY) - Abnormal; Notable for the following components:      Result Value   Color, UA orange (*)    Clarity, UA cloudy (*)    Glucose, UA =100 (*)    Spec Grav, UA >=1.030 (*)    Blood, UA large (*)    Protein Ur, POC >=300 (*)    Nitrite, UA Positive (*)    Leukocytes,  UA Large (3+) (*)    All other components within normal limits  URINE CULTURE    ASSESSMENT & PLAN:  1. Dysuria     Meds ordered this encounter  Medications   cephALEXin (KEFLEX) 500 MG capsule    Sig: Take 1 capsule (500 mg total) by mouth 2 (two) times daily for 10 days.    Dispense:  20 capsule    Refill:  0    Order Specific Question:   Supervising Provider    Answer:   Raylene Everts [[id number]]   Urine concerning for UTI Urine culture sent.  We will call you with the results.   Push fluids and get plenty of rest.   Take antibiotic as directed and to completion Follow up with PCP for recheck and to ensure symptoms are improving Return here or go to ER if you have any new or worsening symptoms such as fever, worsening abdominal pain, nausea/vomiting, flank pain, etc...  Outlined signs and symptoms indicating need for more acute intervention. Patient verbalized understanding. After Visit Summary given.      Lestine Box, PA-C 05/04/21 1738

## 2021-05-05 ENCOUNTER — Telehealth: Payer: Self-pay | Admitting: Emergency Medicine

## 2021-05-05 ENCOUNTER — Emergency Department (HOSPITAL_COMMUNITY): Payer: Medicare Other

## 2021-05-05 ENCOUNTER — Other Ambulatory Visit: Payer: Self-pay

## 2021-05-05 ENCOUNTER — Observation Stay (HOSPITAL_COMMUNITY)
Admission: EM | Admit: 2021-05-05 | Discharge: 2021-05-06 | Disposition: A | Discharge status: Home / Self Care | Payer: Medicare Other | Attending: Emergency Medicine | Admitting: Emergency Medicine

## 2021-05-05 ENCOUNTER — Encounter (HOSPITAL_COMMUNITY): Payer: Self-pay | Admitting: Emergency Medicine

## 2021-05-05 DIAGNOSIS — R69 Illness, unspecified: Secondary | ICD-10-CM | POA: Diagnosis not present

## 2021-05-05 DIAGNOSIS — J45901 Unspecified asthma with (acute) exacerbation: Secondary | ICD-10-CM | POA: Insufficient documentation

## 2021-05-05 DIAGNOSIS — Z79899 Other long term (current) drug therapy: Secondary | ICD-10-CM | POA: Diagnosis not present

## 2021-05-05 DIAGNOSIS — D696 Thrombocytopenia, unspecified: Secondary | ICD-10-CM | POA: Diagnosis present

## 2021-05-05 DIAGNOSIS — J9601 Acute respiratory failure with hypoxia: Secondary | ICD-10-CM | POA: Diagnosis not present

## 2021-05-05 DIAGNOSIS — Z85038 Personal history of other malignant neoplasm of large intestine: Secondary | ICD-10-CM | POA: Diagnosis not present

## 2021-05-05 DIAGNOSIS — R0902 Hypoxemia: Secondary | ICD-10-CM | POA: Diagnosis not present

## 2021-05-05 DIAGNOSIS — N132 Hydronephrosis with renal and ureteral calculous obstruction: Secondary | ICD-10-CM | POA: Diagnosis not present

## 2021-05-05 DIAGNOSIS — J45909 Unspecified asthma, uncomplicated: Secondary | ICD-10-CM | POA: Diagnosis present

## 2021-05-05 DIAGNOSIS — N1330 Unspecified hydronephrosis: Secondary | ICD-10-CM | POA: Diagnosis present

## 2021-05-05 DIAGNOSIS — R2689 Other abnormalities of gait and mobility: Principal | ICD-10-CM | POA: Diagnosis present

## 2021-05-05 DIAGNOSIS — Z8546 Personal history of malignant neoplasm of prostate: Secondary | ICD-10-CM | POA: Diagnosis not present

## 2021-05-05 DIAGNOSIS — R0602 Shortness of breath: Secondary | ICD-10-CM | POA: Diagnosis not present

## 2021-05-05 DIAGNOSIS — K8020 Calculus of gallbladder without cholecystitis without obstruction: Secondary | ICD-10-CM | POA: Diagnosis not present

## 2021-05-05 DIAGNOSIS — Z91040 Latex allergy status: Secondary | ICD-10-CM | POA: Diagnosis not present

## 2021-05-05 DIAGNOSIS — I129 Hypertensive chronic kidney disease with stage 1 through stage 4 chronic kidney disease, or unspecified chronic kidney disease: Secondary | ICD-10-CM | POA: Diagnosis not present

## 2021-05-05 DIAGNOSIS — N179 Acute kidney failure, unspecified: Secondary | ICD-10-CM | POA: Diagnosis present

## 2021-05-05 DIAGNOSIS — E1165 Type 2 diabetes mellitus with hyperglycemia: Secondary | ICD-10-CM

## 2021-05-05 DIAGNOSIS — Z7984 Long term (current) use of oral hypoglycemic drugs: Secondary | ICD-10-CM | POA: Diagnosis not present

## 2021-05-05 DIAGNOSIS — K5730 Diverticulosis of large intestine without perforation or abscess without bleeding: Secondary | ICD-10-CM | POA: Diagnosis not present

## 2021-05-05 DIAGNOSIS — E1122 Type 2 diabetes mellitus with diabetic chronic kidney disease: Secondary | ICD-10-CM | POA: Diagnosis not present

## 2021-05-05 DIAGNOSIS — R509 Fever, unspecified: Secondary | ICD-10-CM

## 2021-05-05 DIAGNOSIS — R82998 Other abnormal findings in urine: Secondary | ICD-10-CM | POA: Insufficient documentation

## 2021-05-05 DIAGNOSIS — Z20822 Contact with and (suspected) exposure to covid-19: Secondary | ICD-10-CM | POA: Diagnosis not present

## 2021-05-05 DIAGNOSIS — I10 Essential (primary) hypertension: Secondary | ICD-10-CM | POA: Diagnosis present

## 2021-05-05 DIAGNOSIS — N1832 Chronic kidney disease, stage 3b: Secondary | ICD-10-CM | POA: Diagnosis not present

## 2021-05-05 DIAGNOSIS — R42 Dizziness and giddiness: Secondary | ICD-10-CM | POA: Diagnosis not present

## 2021-05-05 DIAGNOSIS — Z794 Long term (current) use of insulin: Secondary | ICD-10-CM | POA: Diagnosis not present

## 2021-05-05 DIAGNOSIS — N281 Cyst of kidney, acquired: Secondary | ICD-10-CM | POA: Diagnosis not present

## 2021-05-05 DIAGNOSIS — J4530 Mild persistent asthma, uncomplicated: Secondary | ICD-10-CM | POA: Diagnosis present

## 2021-05-05 DIAGNOSIS — R0600 Dyspnea, unspecified: Secondary | ICD-10-CM

## 2021-05-05 DIAGNOSIS — E119 Type 2 diabetes mellitus without complications: Secondary | ICD-10-CM

## 2021-05-05 DIAGNOSIS — J9811 Atelectasis: Secondary | ICD-10-CM | POA: Diagnosis not present

## 2021-05-05 LAB — COMPREHENSIVE METABOLIC PANEL
ALT: 15 U/L (ref 0–44)
AST: 27 U/L (ref 15–41)
Albumin: 3.6 g/dL (ref 3.5–5.0)
Alkaline Phosphatase: 58 U/L (ref 38–126)
Anion gap: 8 (ref 5–15)
BUN: 28 mg/dL — ABNORMAL HIGH (ref 8–23)
CO2: 25 mmol/L (ref 22–32)
Calcium: 8.5 mg/dL — ABNORMAL LOW (ref 8.9–10.3)
Chloride: 100 mmol/L (ref 98–111)
Creatinine, Ser: 2.09 mg/dL — ABNORMAL HIGH (ref 0.61–1.24)
GFR, Estimated: 31 mL/min — ABNORMAL LOW (ref >60)
Glucose, Bld: 219 mg/dL — ABNORMAL HIGH (ref 70–99)
Potassium: 4.6 mmol/L (ref 3.5–5.1)
Sodium: 133 mmol/L — ABNORMAL LOW (ref 135–145)
Total Bilirubin: 1.6 mg/dL — ABNORMAL HIGH (ref 0.3–1.2)
Total Protein: 6.7 g/dL (ref 6.5–8.1)

## 2021-05-05 LAB — CBC WITH DIFFERENTIAL/PLATELET
Abs Immature Granulocytes: 0.06 10*3/uL (ref 0.00–0.07)
Basophils Absolute: 0 10*3/uL (ref 0.0–0.1)
Basophils Relative: 1 %
Eosinophils Absolute: 0 10*3/uL (ref 0.0–0.5)
Eosinophils Relative: 0 %
HCT: 45.5 % (ref 39.0–52.0)
Hemoglobin: 15.1 g/dL (ref 13.0–17.0)
Immature Granulocytes: 1 %
Lymphocytes Relative: 9 %
Lymphs Abs: 0.7 10*3/uL (ref 0.7–4.0)
MCH: 30.7 pg (ref 26.0–34.0)
MCHC: 33.2 g/dL (ref 30.0–36.0)
MCV: 92.5 fL (ref 80.0–100.0)
Monocytes Absolute: 0.8 10*3/uL (ref 0.1–1.0)
Monocytes Relative: 10 %
Neutro Abs: 6.2 10*3/uL (ref 1.7–7.7)
Neutrophils Relative %: 79 %
Platelets: 112 10*3/uL — ABNORMAL LOW (ref 150–400)
RBC: 4.92 MIL/uL (ref 4.22–5.81)
RDW: 15 % (ref 11.5–15.5)
WBC: 7.7 10*3/uL (ref 4.0–10.5)
nRBC: 0 % (ref 0.0–0.2)

## 2021-05-05 LAB — URINALYSIS, ROUTINE W REFLEX MICROSCOPIC
Bacteria, UA: NONE SEEN
Bilirubin Urine: NEGATIVE
Glucose, UA: 150 mg/dL — AB
Ketones, ur: NEGATIVE mg/dL
Nitrite: NEGATIVE
Protein, ur: NEGATIVE mg/dL
Specific Gravity, Urine: 1.011 (ref 1.005–1.030)
WBC, UA: >50 WBC/hpf — ABNORMAL HIGH (ref 0–5)
pH: 5 (ref 5.0–8.0)

## 2021-05-05 LAB — RESP PANEL BY RT-PCR (FLU A&B, COVID) ARPGX2
Influenza A by PCR: NEGATIVE
Influenza B by PCR: NEGATIVE
SARS Coronavirus 2 by RT PCR: NEGATIVE

## 2021-05-05 LAB — MAGNESIUM: Magnesium: 1.6 mg/dL — ABNORMAL LOW (ref 1.7–2.4)

## 2021-05-05 MED ORDER — SODIUM CHLORIDE 0.9 % IV BOLUS
1000.0000 mL | Freq: Once | INTRAVENOUS | Status: AC
  Administered 2021-05-05: 1000 mL via INTRAVENOUS

## 2021-05-05 MED ORDER — IOHEXOL 350 MG/ML SOLN
75.0000 mL | Freq: Once | INTRAVENOUS | Status: AC | PRN
  Administered 2021-05-05: 75 mL via INTRAVENOUS

## 2021-05-05 MED ORDER — SODIUM CHLORIDE 0.9 % IV SOLN: INTRAVENOUS | Status: DC

## 2021-05-05 MED ORDER — SULFAMETHOXAZOLE-TRIMETHOPRIM 800-160 MG PO TABS: 1.0000 | ORAL_TABLET | Freq: Two times a day (BID) | ORAL | 0 refills | Status: DC

## 2021-05-05 MED ORDER — SODIUM CHLORIDE 0.9 % IV SOLN
1.0000 g | Freq: Once | INTRAVENOUS | Status: AC
  Administered 2021-05-05: 1 g via INTRAVENOUS
  Filled 2021-05-05: qty 10

## 2021-05-05 NOTE — ED Notes (Addendum)
Patient's room air ambulating oxygen sat was 81%. On return to bed while laying supine sat rose to 88% within one minute and 90% at 3 minutes. Patient reported increased difficulty breathing during ambulation.

## 2021-05-05 NOTE — Telephone Encounter (Signed)
Pt came by office and states he has felt "drunk" since he started taking the abx last night.  Pt vs stable, A&O x4, ambulatory to room from waiting room with no difficulty.  Advised patient that provider may send in another abx but he needs to go to the emergency department if symptoms don't resolve.  Pt verbalizes understanding.  Provider notified.

## 2021-05-05 NOTE — ED Provider Notes (Signed)
[facility] EMERGENCY DEPARTMENT Provider Note   CSN: [id number] Arrival date & time: 05/05/21  2039     History Chief Complaint  Patient presents with   Dizziness    [name] is a 86 y.o. male.   Dizziness  This patient is an 86 year old male, he has a known history of colon cancer, coronary disease, hypertension he is also a diabetic who takes both injectable and oral medications.  He presents to the hospital complaining of a feeling of dizziness and lightheadedness, he feels like he cannot walk straight without holding onto walls because of this.  He has been having symptoms of dysuria and some urinary incontinence with dribbling for the last several days and was actually seen at the urgent care at that time, he was initially given cephalexin but states that after he was taking the cephalexin he started to have worsening symptoms so he went back and they changed to Bactrim.  He denied any fevers or chills there is no nausea or vomiting, denied coughing shortness of breath abdominal pain flank pain or hematuria and at this time he again has none of those symptoms though he still has some of his urinary symptoms.  He did not take any of the antibiotics today because he felt like the antibiotics may have been making him feel more dizzy and lightheaded.  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, anomalous circumflex - no PCIs   Essential hypertension, benign    GERD (gastroesophageal reflux disease)    Mixed hyperlipidemia    Obstructive sleep apnea    does not use   Prostate cancer ([place])    PVD (peripheral vascular disease) ([place])    Type 2 diabetes mellitus ([place])     Patient Active  Problem List   Diagnosis Date Noted   Asymptomatic cholelithiasis 07/06/2019   Peripheral polyneuropathy 01/16/2019   Multiple pulmonary nodules 08/01/2016   Upper airway cough syndrome 07/30/2016   Chronic asthma vs UACS  07/11/2016   Mild cognitive impairment 01/22/2016   Essential tremor 10/17/2015   CKD (chronic kidney disease) stage 3, GFR 30-59 ml/min ([place]) 07/02/2015   AKI (acute kidney injury) ([place]) 06/30/2015   Acute kidney injury ([place]) 06/30/2015   B12 deficiency 02/07/2014   Diverticulosis of colon with hemorrhage 01/01/2014   Thrombocytopenia, unspecified ([place]) 01/01/2014   Acute blood loss anemia 01/01/2014   Leukopenia 12/31/2013   Lower GI bleed 12/30/2013   Diverticulitis 12/30/2013   Abdominal pain, left lower quadrant 11/16/2013   Abdominal pain, unspecified site 11/16/2013   Mantle cell lymphoma ([place]) 09/07/2013   Prostate cancer ([place]) 09/07/2013   Proctitis, 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   H/O ulcerative colitis 04/12/2013   OSA (obstructive sleep apnea) 08/15/2012   VASOMOTOR RHINITIS 01/23/2010   GERD 01/23/2010   SOB (shortness of breath) 01/01/2010   Mixed hyperlipidemia 11/23/2009   HYPERTENSION, BENIGN 11/23/2009   Coronary atherosclerosis of native coronary artery 02/18/2009    Past Surgical History:  Procedure Laterality Date   BACK SURGERY     BALLOON DILATION N/A 08/27/2013   Procedure: BALLOON DILATION;  Surgeon: Rogene Houston, MD;  [facility];  Service: Endoscopy;  Laterality: N/A;  COLON SURGERY     COLONOSCOPY N/A 12/17/2013   Procedure: COLONOSCOPY;  Surgeon: Rogene Houston, MD;  [facility];  Service: Endoscopy;  Laterality: N/A;  940   COLONOSCOPY N/A 02/28/2014   Procedure: COLONOSCOPY;  Surgeon: Rogene Houston, MD;  [facility];  Service: Endoscopy;  Laterality: N/A;  730   COLONOSCOPY N/A 08/04/2015   Procedure: COLONOSCOPY;  Surgeon: Rogene Houston, MD;  [facility];  Service: Endoscopy;   Laterality: N/A;  200 - moved to 11/11 @ 2:10 - Ann to notify pt   COLONOSCOPY WITH ESOPHAGOGASTRODUODENOSCOPY (EGD) N/A 08/27/2013   Procedure: COLONOSCOPY WITH ESOPHAGOGASTRODUODENOSCOPY (EGD);  Surgeon: Rogene Houston, MD;  [facility];  Service: Endoscopy;  Laterality: N/A;  730   IR REMOVAL TUN ACCESS W/ PORT W/O FL MOD SED  07/17/2018   KNEE ARTHROSCOPY Left    KNEE SURGERY Right    total knee   MALONEY DILATION N/A 08/27/2013   Procedure: MALONEY DILATION;  Surgeon: Rogene Houston, MD;  [facility];  Service: Endoscopy;  Laterality: N/A;   PORTACATH PLACEMENT Right 2014   PROSTATECTOMY     removal of port Right    SAVORY DILATION N/A 08/27/2013   Procedure: SAVORY DILATION;  Surgeon: Rogene Houston, MD;  [facility];  Service: Endoscopy;  Laterality: N/A;   SHOULDER SURGERY     TONSILLECTOMY     VASECTOMY         Family History  Problem Relation Age of Onset   Colon cancer Brother    Cancer Brother    Diabetes Father    Cancer Brother     Social History   Tobacco Use   Smoking status: Never   Smokeless tobacco: Never  Vaping Use   Vaping Use: Never used  Substance Use Topics   Alcohol use: No   Drug use: No    Home Medications Prior to Admission medications   Medication Sig Start Date End Date Taking? Authorizing Provider  Accu-Chek FastClix Lancets MISC Apply topically. 07/06/20   [provider]  ACCU-CHEK GUIDE test strip  07/06/20   [provider]  acetaminophen (TYLENOL) 325 MG tablet Take 650 mg by mouth every 4 (four) hours as needed for mild pain or moderate pain. Take 2 tablets 1 hour prior to Rituxan treatment.    [provider]  albuterol (PROAIR HFA) 108 (90 Base) MCG/ACT inhaler Inhale 2 puffs into the lungs at bedtime as needed for wheezing or shortness of breath. 05/12/20   Tanda Rockers, MD  amLODipine (NORVASC) 5 MG tablet Take 1 tablet (5 mg total) by mouth daily. 09/12/20    Burtis Junes, NP  atorvastatin (LIPITOR) 20 MG tablet Take 20 mg by mouth daily.    [provider]  budesonide-formoterol (SYMBICORT) 160-4.5 MCG/ACT inhaler Inhale 2 puffs into the lungs 2 (two) times daily. 05/12/20   Tanda Rockers, MD  cephALEXin (KEFLEX) 500 MG capsule Take 1 capsule (500 mg total) by mouth 2 (two) times daily for 10 days. 05/04/21 05/14/21  Lestine Box, PA-C  docusate sodium (COLACE) 100 MG capsule Take 200 mg by mouth daily as needed for mild constipation.    [provider]  fluticasone (FLONASE) 50 MCG/ACT nasal spray Place 1 spray into both nostrils as needed for allergies or rhinitis.    [provider]  gabapentin (NEURONTIN) 300 MG capsule Take 1 cap in AM and 2 caps  at night (300AM, 600PM) 02/15/21   Frann Rider, NP  glipiZIDE (GLUCOTROL) 10 MG tablet Take 10 mg by mouth 2 (two) times daily before a meal.  01/09/16   [provider]  hydrocortisone 2.5 % cream Apply 1 application topically as needed (itching).    [provider]  insulin lispro (HUMALOG) 100 UNIT/ML cartridge Inject 1-10 Units into the skin as directed. On sliding scale when eating a big meal    [provider]  ipratropium (ATROVENT) 0.06 % nasal spray Place 2 sprays into both nostrils 2 (two) times daily.  04/15/17   [provider]  meclizine (ANTIVERT) 25 MG tablet Take 25 mg by mouth 2 (two) times daily as needed for dizziness.  05/01/17   [provider]  neomycin-polymyxin b-dexamethasone (MAXITROL) 3.5-10000-0.1 OINT  07/06/20   [provider]  ondansetron (ZOFRAN) 4 MG tablet Take by mouth as needed.     [provider]  pantoprazole (PROTONIX) 40 MG tablet TAKE 1 TABLET BY MOUTH 2 TIMES DAILY BEFORE A MEAL. 05/08/20 07/04/20  Harvel Quale, MD  propranolol ER (INDERAL LA) 120 MG 24 hr capsule Take 1 capsule (120 mg total) by mouth at bedtime. For tremor. 02/15/21   Frann Rider, NP   sulfamethoxazole-trimethoprim (BACTRIM DS) 800-160 MG tablet Take 1 tablet by mouth 2 (two) times daily for 7 days. 05/05/21 05/12/21  Lestine Box, PA-C  temazepam (RESTORIL) 15 MG capsule Take 15 mg by mouth at bedtime as needed. 06/26/20   [provider]  testosterone cypionate (DEPOTESTOSTERONE CYPIONATE) 200 MG/ML injection Inject 200 mg into the muscle every 14 (fourteen) days.  04/29/16   [provider]  TOUJEO SOLOSTAR 300 UNIT/ML SOPN Inject 70 Units into the skin every morning.  03/23/16   [provider]  insulin aspart (NOVOLOG) 100 UNIT/ML injection Inject 20 Units into the skin 3 (three) times daily before meals.    12/13/11  [provider]  rosuvastatin (CRESTOR) 40 MG tablet Take 40 mg by mouth daily.    01/13/19  [provider]    Allergies    Bee venom, Adhesive [tape], Ciprofloxacin, Codeine, Iodine, Latex, Povidone-iodine, and Simvastatin  Review of Systems   Review of Systems  Neurological:  Positive for dizziness.  All other systems reviewed and are negative.  Physical Exam Updated Vital Signs BP 121/85   Pulse 95   Temp 98.1 F (36.7 C)   Resp 18   Ht 1.803 m (5' 11" )   Wt 99.8 kg   SpO2 91%   BMI 30.68 kg/m   Physical Exam Vitals and nursing note reviewed.  Constitutional:      General: He is not in acute distress.    Appearance: He is well-developed.  HENT:     Head: Normocephalic and atraumatic.     Mouth/Throat:     Pharynx: No oropharyngeal exudate.  Eyes:     General: No scleral icterus.       Right eye: No discharge.        Left eye: No discharge.     Conjunctiva/sclera: Conjunctivae normal.     Pupils: Pupils are equal, round, and reactive to light.  Neck:     Thyroid: No thyromegaly.     Vascular: No JVD.  Cardiovascular:     Rate and Rhythm: Regular rhythm. Tachycardia present.     Heart sounds: Normal heart sounds. No murmur heard.   No friction rub. No gallop.     Comments: Heart rate of  around 100 bpm Pulmonary:     Effort: Pulmonary effort is normal. No respiratory distress.     Breath sounds: Normal breath sounds. No wheezing or rales.  Abdominal:     General: Bowel sounds are normal. There is no distension.     Palpations: Abdomen is soft. There is no mass.     Tenderness: There is no abdominal tenderness.  Musculoskeletal:        General: No tenderness. Normal range of motion.     Cervical back: Normal range of motion and neck supple.  Lymphadenopathy:     Cervical: No cervical adenopathy.  Skin:    General: Skin is warm and dry.     Findings: No erythema or rash.  Neurological:     Mental Status: He is alert.     Coordination: Coordination normal.     Comments: The patient is able to get up and stand up on his own but he does have to hold onto the wall when he is walking because he is off balance and lightheaded.  He is able to speak in full sentences and moves all 4 extremities with normal strength and coordination while he is in a sitting position.  Psychiatric:        Behavior: Behavior normal.    ED Results / Procedures / Treatments   Labs (all labs ordered are listed, but only abnormal results are displayed) Labs Reviewed - No data to display  EKG None  Radiology No results found.  Procedures Procedures   Medications Ordered in ED Medications - No data to display  ED Course  I have reviewed the triage vital signs and the nursing notes.  Pertinent labs & imaging results that were available during my care of the patient were reviewed by me and considered in my medical decision making (see chart for details).    MDM Rules/Calculators/A&P                           I am concerned the patient may have a progressive infection, will need to check labs, CBC, metabolic panel and a repeat urinalysis.  We will also get a CT scan of the brain given his neurologic symptoms of imbalance and ataxia.  He does not have a fever or hypotension at this  time.  Unfortunately no definite etiology of the patient's symptoms has been found however there is some concern for neurologic however he is also hypoxic without an evident injury.  No obvious source of hypoxia  The patient was signed out at change of shift to Dr. Stark Jock following up on CT scans and anticipate admission  Final Clinical Impression(s) / ED Diagnoses Final diagnoses:  None    Rx / DC Orders ED Discharge Orders     None        Noemi Chapel, MD 05/06/21 1454

## 2021-05-05 NOTE — ED Triage Notes (Signed)
Pt c/o dizziness. States it started after beginning abx for UTI. Seen at [facility] yesterday for urinary symptoms

## 2021-05-05 NOTE — Telephone Encounter (Signed)
Antibiotic changed

## 2021-05-06 ENCOUNTER — Encounter (HOSPITAL_COMMUNITY): Payer: Self-pay | Admitting: Family Medicine

## 2021-05-06 DIAGNOSIS — Z794 Long term (current) use of insulin: Secondary | ICD-10-CM | POA: Diagnosis not present

## 2021-05-06 DIAGNOSIS — R509 Fever, unspecified: Secondary | ICD-10-CM

## 2021-05-06 DIAGNOSIS — N1330 Unspecified hydronephrosis: Secondary | ICD-10-CM | POA: Diagnosis not present

## 2021-05-06 DIAGNOSIS — J45901 Unspecified asthma with (acute) exacerbation: Secondary | ICD-10-CM

## 2021-05-06 DIAGNOSIS — J9601 Acute respiratory failure with hypoxia: Secondary | ICD-10-CM

## 2021-05-06 DIAGNOSIS — D696 Thrombocytopenia, unspecified: Secondary | ICD-10-CM

## 2021-05-06 DIAGNOSIS — E1122 Type 2 diabetes mellitus with diabetic chronic kidney disease: Secondary | ICD-10-CM | POA: Diagnosis not present

## 2021-05-06 DIAGNOSIS — N1832 Chronic kidney disease, stage 3b: Secondary | ICD-10-CM | POA: Diagnosis not present

## 2021-05-06 DIAGNOSIS — E119 Type 2 diabetes mellitus without complications: Secondary | ICD-10-CM

## 2021-05-06 DIAGNOSIS — E1165 Type 2 diabetes mellitus with hyperglycemia: Secondary | ICD-10-CM

## 2021-05-06 DIAGNOSIS — R2689 Other abnormalities of gait and mobility: Secondary | ICD-10-CM | POA: Diagnosis not present

## 2021-05-06 DIAGNOSIS — J45909 Unspecified asthma, uncomplicated: Secondary | ICD-10-CM | POA: Diagnosis present

## 2021-05-06 DIAGNOSIS — J4530 Mild persistent asthma, uncomplicated: Secondary | ICD-10-CM

## 2021-05-06 LAB — CBC
HCT: 47 % (ref 39.0–52.0)
Hemoglobin: 15.2 g/dL (ref 13.0–17.0)
MCH: 30.8 pg (ref 26.0–34.0)
MCHC: 32.3 g/dL (ref 30.0–36.0)
MCV: 95.1 fL (ref 80.0–100.0)
Platelets: 112 10*3/uL — ABNORMAL LOW (ref 150–400)
RBC: 4.94 MIL/uL (ref 4.22–5.81)
RDW: 15.1 % (ref 11.5–15.5)
WBC: 6.9 10*3/uL (ref 4.0–10.5)
nRBC: 0 % (ref 0.0–0.2)

## 2021-05-06 LAB — BASIC METABOLIC PANEL
Anion gap: 11 (ref 5–15)
BUN: 29 mg/dL — ABNORMAL HIGH (ref 8–23)
CO2: 26 mmol/L (ref 22–32)
Calcium: 8.5 mg/dL — ABNORMAL LOW (ref 8.9–10.3)
Chloride: 97 mmol/L — ABNORMAL LOW (ref 98–111)
Creatinine, Ser: 1.9 mg/dL — ABNORMAL HIGH (ref 0.61–1.24)
GFR, Estimated: 35 mL/min — ABNORMAL LOW (ref >60)
Glucose, Bld: 268 mg/dL — ABNORMAL HIGH (ref 70–99)
Potassium: 4.6 mmol/L (ref 3.5–5.1)
Sodium: 134 mmol/L — ABNORMAL LOW (ref 135–145)

## 2021-05-06 LAB — GLUCOSE, CAPILLARY
Glucose-Capillary: 286 mg/dL — ABNORMAL HIGH (ref 70–99)
Glucose-Capillary: 417 mg/dL — ABNORMAL HIGH (ref 70–99)
Glucose-Capillary: 384 mg/dL — ABNORMAL HIGH (ref 70–99)
Glucose-Capillary: 459 mg/dL — ABNORMAL HIGH (ref 70–99)
Glucose-Capillary: 411 mg/dL — ABNORMAL HIGH (ref 70–99)
Glucose-Capillary: 353 mg/dL — ABNORMAL HIGH (ref 70–99)
Glucose-Capillary: 304 mg/dL — ABNORMAL HIGH (ref 70–99)

## 2021-05-06 LAB — HEMOGLOBIN A1C
Hgb A1c MFr Bld: 8.3 % — ABNORMAL HIGH (ref 4.8–5.6)
Mean Plasma Glucose: 191.51 mg/dL

## 2021-05-06 MED ORDER — IPRATROPIUM-ALBUTEROL 0.5-2.5 (3) MG/3ML IN SOLN
3.0000 mL | Freq: Once | RESPIRATORY_TRACT | Status: AC
  Administered 2021-05-06: 3 mL via RESPIRATORY_TRACT
  Filled 2021-05-06: qty 3

## 2021-05-06 MED ORDER — ALBUTEROL SULFATE (2.5 MG/3ML) 0.083% IN NEBU
2.5000 mg | INHALATION_SOLUTION | RESPIRATORY_TRACT | Status: DC | PRN
  Administered 2021-05-06: 2.5 mg via RESPIRATORY_TRACT
  Filled 2021-05-06: qty 3

## 2021-05-06 MED ORDER — MECLIZINE HCL 12.5 MG PO TABS
25.0000 mg | ORAL_TABLET | Freq: Once | ORAL | Status: AC
  Administered 2021-05-06: 25 mg via ORAL
  Filled 2021-05-06: qty 2

## 2021-05-06 MED ORDER — INSULIN ASPART 100 UNIT/ML IJ SOLN
20.0000 [IU] | Freq: Once | INTRAMUSCULAR | Status: AC
  Administered 2021-05-06: 20 [IU] via SUBCUTANEOUS

## 2021-05-06 MED ORDER — CEFDINIR 300 MG PO CAPS: 300.0000 mg | ORAL_CAPSULE | Freq: Two times a day (BID) | ORAL | 0 refills | Status: AC

## 2021-05-06 MED ORDER — METHYLPREDNISOLONE SODIUM SUCC 125 MG IJ SOLR: 80.0000 mg | Freq: Two times a day (BID) | INTRAMUSCULAR | Status: DC

## 2021-05-06 MED ORDER — SODIUM CHLORIDE 0.9 % IV SOLN: 1.0000 g | INTRAVENOUS | Status: DC

## 2021-05-06 MED ORDER — ACETAMINOPHEN 325 MG PO TABS: 650.0000 mg | ORAL_TABLET | Freq: Four times a day (QID) | ORAL | Status: DC | PRN

## 2021-05-06 MED ORDER — MAGNESIUM SULFATE 2 GM/50ML IV SOLN
2.0000 g | Freq: Once | INTRAVENOUS | Status: AC
  Administered 2021-05-06: 2 g via INTRAVENOUS
  Filled 2021-05-06: qty 50

## 2021-05-06 MED ORDER — ONDANSETRON HCL 4 MG PO TABS: 4.0000 mg | ORAL_TABLET | Freq: Four times a day (QID) | ORAL | Status: DC | PRN

## 2021-05-06 MED ORDER — INSULIN GLARGINE-YFGN 100 UNIT/ML ~~LOC~~ SOLN
30.0000 [IU] | Freq: Every day | SUBCUTANEOUS | Status: DC
  Filled 2021-05-06: qty 0.3

## 2021-05-06 MED ORDER — INSULIN ASPART 100 UNIT/ML IJ SOLN: 0.0000 [IU] | Freq: Every day | INTRAMUSCULAR | Status: DC

## 2021-05-06 MED ORDER — ONDANSETRON HCL 4 MG/2ML IJ SOLN: 4.0000 mg | Freq: Four times a day (QID) | INTRAMUSCULAR | Status: DC | PRN

## 2021-05-06 MED ORDER — INSULIN ASPART 100 UNIT/ML IJ SOLN
0.0000 [IU] | Freq: Three times a day (TID) | INTRAMUSCULAR | Status: DC
  Administered 2021-05-06: 5 [IU] via SUBCUTANEOUS
  Administered 2021-05-06: 10 [IU] via SUBCUTANEOUS

## 2021-05-06 MED ORDER — METHYLPREDNISOLONE SODIUM SUCC 125 MG IJ SOLR
125.0000 mg | Freq: Once | INTRAMUSCULAR | Status: AC
  Administered 2021-05-06: 125 mg via INTRAVENOUS
  Filled 2021-05-06: qty 2

## 2021-05-06 MED ORDER — ASPIRIN EC 81 MG PO TBEC: 81.0000 mg | DELAYED_RELEASE_TABLET | Freq: Every day | ORAL | 0 refills | Status: DC

## 2021-05-06 MED ORDER — SODIUM CHLORIDE 0.9 % IV SOLN: INTRAVENOUS | Status: DC

## 2021-05-06 MED ORDER — CEFDINIR 300 MG PO CAPS: 300.0000 mg | ORAL_CAPSULE | Freq: Two times a day (BID) | ORAL | 0 refills | Status: DC

## 2021-05-06 MED ORDER — PREDNISONE 10 MG PO TABS: 40.0000 mg | ORAL_TABLET | Freq: Every day | ORAL | 0 refills | Status: DC

## 2021-05-06 MED ORDER — INSULIN ASPART 100 UNIT/ML IV SOLN
10.0000 [IU] | Freq: Once | INTRAVENOUS | Status: AC
  Administered 2021-05-06: 10 [IU] via INTRAVENOUS

## 2021-05-06 MED ORDER — ACETAMINOPHEN 650 MG RE SUPP: 650.0000 mg | Freq: Four times a day (QID) | RECTAL | Status: DC | PRN

## 2021-05-06 MED ORDER — INSULIN ASPART 100 UNIT/ML IJ SOLN
10.0000 [IU] | Freq: Once | INTRAMUSCULAR | Status: AC
  Administered 2021-05-06: 10 [IU] via SUBCUTANEOUS

## 2021-05-06 MED ORDER — PREDNISONE 10 MG PO TABS: 40.0000 mg | ORAL_TABLET | Freq: Every day | ORAL | 0 refills | Status: AC

## 2021-05-06 NOTE — Care Management Obs Status (Signed)
[place] NOTIFICATION   Patient Details  Name: AHMANI PREHN MRN: [id number] Date of Birth: 1935/02/12   Medicare Observation Status Notification Given:  Yes    Natasha Bence, LCSW 05/06/2021, 5:43 PM

## 2021-05-06 NOTE — ED Provider Notes (Signed)
  Physical Exam  BP (!) 164/97 (BP Location: Left Arm)   Pulse 90   Temp 97.7 F (36.5 C) (Oral)   Resp 20   Ht 5' 11"  (1.803 m)   Wt 99.8 kg   SpO2 98%   BMI 30.68 kg/m   Physical Exam Constitutional:      Appearance: He is ill-appearing.  HENT:     Head: Normocephalic and atraumatic.  Cardiovascular:     Rate and Rhythm: Normal rate.  Pulmonary:     Effort: Pulmonary effort is normal.  Musculoskeletal:     Right lower leg: Edema present.     Left lower leg: Edema present.  Skin:    General: Skin is warm and dry.  Neurological:     Mental Status: He is alert and oriented to person, place, and time.    ED Course/Procedures     Procedures  MDM  Care assumed from Dr. Sabra Heck at shift change.  Patient awaiting results of CTA of the chest to rule out pulmonary embolism.  Patient presented here short of breath and hypoxic for reasons unknown.  He has history of pulmonary embolism, however has no large PE on today's scan.  There is also no definitive infiltrate or pneumonia.  While here, patient did experience fever to 102.6, the source of which I am uncertain.  He was given Rocephin and Zithromax for presumed pneumonia.  Blood cultures and urine cultures are pending.  Initial lactate was 3.5, but increased to 5.4.  Additional IV fluids ordered.  I have discussed care with Dr. Myna Hidalgo from the hospitalist service.  He will evaluate and admit.  CRITICAL CARE Performed by: Veryl Speak Total critical care time: 35 minutes Critical care time was exclusive of separately billable procedures and treating other patients. Critical care was necessary to treat or prevent imminent or life-threatening deterioration. Critical care was time spent personally by me on the following activities: development of treatment plan with patient and/or surrogate as well as nursing, discussions with consultants, evaluation of patient's response to treatment, examination of patient, obtaining history from  patient or surrogate, ordering and performing treatments and interventions, ordering and review of laboratory studies, ordering and review of radiographic studies, pulse oximetry and re-evaluation of patient's condition.        Veryl Speak, MD 05/06/21 [phone]

## 2021-05-06 NOTE — Progress Notes (Signed)
Patient very agitated and irritable with nursing staff upon arrival to floor. Patient grabbed home medicine from nurses hands aggressively and refused to give home meds over to nurse Reino Kent and charge nurse Santa Lighter. Patient informed of policy for home meds and security called to room. Patient agreed to store meds in pharmacy until discharge, Nurse B. Owens Shark and Camera operator S. Alucard Fearnow verified count in front of patient. Meds sealed and sent to pharmacy.

## 2021-05-06 NOTE — Progress Notes (Signed)
Ambulated patient to nurses desk and back oxygen maintained at 90-91% RA with activity. Patient returned to 94-95% RA after rest. MD informed.

## 2021-05-06 NOTE — Progress Notes (Signed)
Unable to do a full assessment because patient would not cooperate. He was very irritable and uncooperative. He would not allow [place] to count and package his home medicine. He snatched the bag out of Brianna's hand and said we were not touching his medications. Security had to be called and then he allowed [place] to count and send his medication to the pharmacy.

## 2021-05-06 NOTE — H&P (Signed)
History and Physical    [name]:[id number] DOB: 1934-11-28 DOA: 05/05/2021  PCP: Celene Squibb, MD   Patient coming from: Home    Chief Complaint: Painful urination, dizzy/difficulty with balance, cough, wheeze, SOB   HPI: [name] is a 86 y.o. male with medical history significant for asthma, hypertension, insulin-dependent diabetes mellitus, OSA not using CPAP, prostate cancer status post prostatectomy, and chronic kidney disease IIIb, now presenting to the emergency department with balance difficulty, cough, shortness of breath, wheezing, and painful urination.  The patient was started on antibiotics recently for dysuria but then developed difficulty ambulating, feeling as though he is going to fall over, and holding onto a wall while he walks.  He stopped the antibiotic due to concern that his balance issues were from the medication.  He denies flank pain or fevers.  He has also had increased cough and shortness of breath.  Breathing improves temporarily with inhalers at home.  He denies chest pain or leg swelling.  ED Course: Upon arrival to the ED, patient is found to be afebrile, saturating upper 80s on room air, slightly tachypneic, and with stable blood pressure.  Chemistry panel notable for glucose 219 and creatinine 2.09.  CBC with platelets 112,000.  Chest x-ray with low volumes and basilar atelectasis.  Head CT negative for acute intracranial abnormality.  CTA chest negative for PE but notable for mild right base atelectasis.  CT of the abdomen and pelvis notable for bilateral hydroureteronephrosis, left greater than right in the absence of obstructing Roets and with surgically absent prostate.  Bladder is mildly distended and diffusely thickened on CT.  Urine was sent for culture and the patient was given a liter of saline, meclizine, and Rocephin in the ED.  Review of Systems:  All other systems reviewed and apart from HPI, are negative.  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, anomalous circumflex - no PCIs   Essential hypertension, benign    GERD (gastroesophageal reflux disease)    Mixed hyperlipidemia    Obstructive sleep apnea    does not use   Prostate cancer ([place])    PVD (peripheral vascular disease) ([place])    Type 2 diabetes mellitus ([place])     Past Surgical History:  Procedure Laterality Date   BACK SURGERY     BALLOON DILATION N/A 08/27/2013   Procedure: BALLOON DILATION;  Surgeon: Rogene Houston, MD;  [facility];  Service: Endoscopy;  Laterality: N/A;   COLON SURGERY     COLONOSCOPY N/A 12/17/2013   Procedure: COLONOSCOPY;  Surgeon: Rogene Houston, MD;  [facility];  Service: Endoscopy;  Laterality: N/A;  940   COLONOSCOPY N/A 02/28/2014   Procedure: COLONOSCOPY;  Surgeon: Rogene Houston, MD;  [facility];  Service: Endoscopy;  Laterality: N/A;  730   COLONOSCOPY N/A 08/04/2015   Procedure: COLONOSCOPY;  Surgeon: Rogene Houston, MD;  [facility];  Service: Endoscopy;  Laterality: N/A;  200 - moved to 11/11 @ 2:10 - Ann to notify pt   COLONOSCOPY WITH ESOPHAGOGASTRODUODENOSCOPY (EGD) N/A 08/27/2013   Procedure: COLONOSCOPY WITH ESOPHAGOGASTRODUODENOSCOPY (EGD);  Surgeon: Rogene Houston, MD;  [facility];  Service: Endoscopy;  Laterality: N/A;  730   IR REMOVAL TUN ACCESS W/ PORT W/O FL MOD  SED  07/17/2018   KNEE ARTHROSCOPY Left    KNEE SURGERY Right    total knee   MALONEY DILATION N/A 08/27/2013   Procedure: MALONEY DILATION;  Surgeon: Rogene Houston, MD;  [facility];  Service: Endoscopy;  Laterality: N/A;   PORTACATH PLACEMENT Right 2014   PROSTATECTOMY     removal of port Right    SAVORY DILATION N/A 08/27/2013   Procedure: SAVORY DILATION;  Surgeon: Rogene Houston, MD;  [facility];   Service: Endoscopy;  Laterality: N/A;   SHOULDER SURGERY     TONSILLECTOMY     VASECTOMY      Social History:   reports that he has never smoked. He has never used smokeless tobacco. He reports that he does not drink alcohol and does not use drugs.  Allergies  Allergen Reactions   Bee Venom Anaphylaxis   Adhesive [Tape] Hives   Ciprofloxacin     Unknown    Codeine     Unknown     Iodine     Unknown     Latex     Unknown     Povidone-Iodine     Unknown     Simvastatin     Unknown      Family History  Problem Relation Age of Onset   Colon cancer Brother    Cancer Brother    Diabetes Father    Cancer Brother      Prior to Admission medications   Medication Sig Start Date End Date Taking? Authorizing Provider  Accu-Chek FastClix Lancets MISC Apply topically. 07/06/20   [provider]  ACCU-CHEK GUIDE test strip  07/06/20   [provider]  acetaminophen (TYLENOL) 325 MG tablet Take 650 mg by mouth every 4 (four) hours as needed for mild pain or moderate pain. Take 2 tablets 1 hour prior to Rituxan treatment.    [provider]  albuterol (PROAIR HFA) 108 (90 Base) MCG/ACT inhaler Inhale 2 puffs into the lungs at bedtime as needed for wheezing or shortness of breath. 05/12/20   Tanda Rockers, MD  amLODipine (NORVASC) 5 MG tablet Take 1 tablet (5 mg total) by mouth daily. 09/12/20   Burtis Junes, NP  atorvastatin (LIPITOR) 20 MG tablet Take 20 mg by mouth daily.    [provider]  budesonide-formoterol (SYMBICORT) 160-4.5 MCG/ACT inhaler Inhale 2 puffs into the lungs 2 (two) times daily. 05/12/20   Tanda Rockers, MD  cephALEXin (KEFLEX) 500 MG capsule Take 1 capsule (500 mg total) by mouth 2 (two) times daily for 10 days. 05/04/21 05/14/21  Lestine Box, PA-C  docusate sodium (COLACE) 100 MG capsule Take 200 mg by mouth daily as needed for mild constipation.    [provider]  fluticasone (FLONASE) 50 MCG/ACT nasal  spray Place 1 spray into both nostrils as needed for allergies or rhinitis.    [provider]  gabapentin (NEURONTIN) 300 MG capsule Take 1 cap in AM and 2 caps at night (300AM, 600PM) 02/15/21   Frann Rider, NP  glipiZIDE (GLUCOTROL) 10 MG tablet Take 10 mg by mouth 2 (two) times daily before a meal.  01/09/16   [provider]  hydrocortisone 2.5 % cream Apply 1 application topically as needed (itching).    [provider]  insulin lispro (HUMALOG) 100 UNIT/ML cartridge Inject 1-10 Units into the skin as directed. On sliding scale when eating a big meal    [provider]  ipratropium (  ATROVENT) 0.06 % nasal spray Place 2 sprays into both nostrils 2 (two) times daily.  04/15/17   [provider]  meclizine (ANTIVERT) 25 MG tablet Take 25 mg by mouth 2 (two) times daily as needed for dizziness.  05/01/17   [provider]  neomycin-polymyxin b-dexamethasone (MAXITROL) 3.5-10000-0.1 OINT  07/06/20   [provider]  ondansetron (ZOFRAN) 4 MG tablet Take by mouth as needed.     [provider]  pantoprazole (PROTONIX) 40 MG tablet TAKE 1 TABLET BY MOUTH 2 TIMES DAILY BEFORE A MEAL. 05/08/20 07/04/20  Harvel Quale, MD  propranolol ER (INDERAL LA) 120 MG 24 hr capsule Take 1 capsule (120 mg total) by mouth at bedtime. For tremor. 02/15/21   Frann Rider, NP  sulfamethoxazole-trimethoprim (BACTRIM DS) 800-160 MG tablet Take 1 tablet by mouth 2 (two) times daily for 7 days. 05/05/21 05/12/21  Lestine Box, PA-C  temazepam (RESTORIL) 15 MG capsule Take 15 mg by mouth at bedtime as needed. 06/26/20   [provider]  testosterone cypionate (DEPOTESTOSTERONE CYPIONATE) 200 MG/ML injection Inject 200 mg into the muscle every 14 (fourteen) days.  04/29/16   [provider]  TOUJEO SOLOSTAR 300 UNIT/ML SOPN Inject 70 Units into the skin every morning.  03/23/16   [provider]  insulin aspart (NOVOLOG)  100 UNIT/ML injection Inject 20 Units into the skin 3 (three) times daily before meals.    12/13/11  [provider]  rosuvastatin (CRESTOR) 40 MG tablet Take 40 mg by mouth daily.    01/13/19  [provider]    Physical Exam: Vitals:   05/05/21 2330 05/06/21 0000 05/06/21 0030 05/06/21 0130  BP: 134/84 (!) 155/99 (!) 161/81 (!) 164/97  Pulse: 88  94 90  Resp: 20 19 19 20   Temp:   97.7 F (36.5 C)   TempSrc:   Oral   SpO2: 96%  90% 98%  Weight:      Height:        Constitutional: NAD, calm  Eyes: PERTLA, lids and conjunctivae normal ENMT: Mucous membranes are moist. Posterior pharynx clear of any exudate or lesions.   Neck: supple, no masses  Respiratory: Diminished bilaterally with prolonged expiratory phase and wheezes. No cyanosis.  Cardiovascular: S1 & S2 heard, regular rate and rhythm. No extremity edema.   Abdomen: No distension, no tenderness, soft. Bowel sounds active.  Musculoskeletal: no clubbing / cyanosis. No joint deformity upper and lower extremities.   Skin: no significant rashes, lesions, ulcers. Warm, dry, well-perfused. Neurologic: CN 2-12 grossly intact. Sensation intact, DTR normal. Strength 5/5 in all 4 limbs.  Psychiatric: Alert and oriented to person, place, and situation. Pleasant and cooperative.    Labs and Imaging on Admission: I have personally reviewed following labs and imaging studies  CBC: Recent Labs  Lab 05/05/21 2216  WBC 7.7  NEUTROABS 6.2  HGB 15.1  HCT 45.5  MCV 92.5  PLT 419*   Basic Metabolic Panel: Recent Labs  Lab 05/05/21 2216  NA 133*  K 4.6  CL 100  CO2 25  GLUCOSE 219*  BUN 28*  CREATININE 2.09*  CALCIUM 8.5*  MG 1.6*   GFR: Estimated Creatinine Clearance: 32.2 mL/min (A) (by C-G formula based on SCr of 2.09 mg/dL (H)). Liver Function Tests: Recent Labs  Lab 05/05/21 2216  AST 27  ALT 15  ALKPHOS 58  BILITOT 1.6*  PROT 6.7  ALBUMIN 3.6   No results for input(s): LIPASE, AMYLASE in the  last  168 hours. No results for input(s): AMMONIA in the last 168 hours. Coagulation Profile: No results for input(s): INR, PROTIME in the last 168 hours. Cardiac Enzymes: No results for input(s): CKTOTAL, CKMB, CKMBINDEX, TROPONINI in the last 168 hours. BNP (last 3 results) No results for input(s): PROBNP in the last 8760 hours. HbA1C: No results for input(s): HGBA1C in the last 72 hours. CBG: No results for input(s): GLUCAP in the last 168 hours. Lipid Profile: No results for input(s): CHOL, HDL, LDLCALC, TRIG, CHOLHDL, LDLDIRECT in the last 72 hours. Thyroid Function Tests: No results for input(s): TSH, T4TOTAL, FREET4, T3FREE, THYROIDAB in the last 72 hours. Anemia Panel: No results for input(s): VITAMINB12, FOLATE, FERRITIN, TIBC, IRON, RETICCTPCT in the last 72 hours. Urine analysis:    Component Value Date/Time   COLORURINE YELLOW 05/05/2021 2220   APPEARANCEUR CLEAR 05/05/2021 2220   LABSPEC 1.011 05/05/2021 2220   PHURINE 5.0 05/05/2021 2220   GLUCOSEU 150 (A) 05/05/2021 2220   HGBUR MODERATE (A) 05/05/2021 2220   BILIRUBINUR NEGATIVE 05/05/2021 2220   BILIRUBINUR negative 05/04/2021 1702   KETONESUR NEGATIVE 05/05/2021 2220   PROTEINUR NEGATIVE 05/05/2021 2220   UROBILINOGEN 1.0 05/04/2021 1702   UROBILINOGEN 0.2 06/30/2015 1552   NITRITE NEGATIVE 05/05/2021 2220   LEUKOCYTESUR MODERATE (A) 05/05/2021 2220   Sepsis Labs: @LABRCNTIP (procalcitonin:4,lacticidven:4) ) Recent Results (from the past 240 hour(s))  Resp Panel by RT-PCR (Flu A&B, Covid) Nasopharyngeal Swab     Status: None   Collection Time: 05/05/21 10:28 PM   Specimen: Nasopharyngeal Swab; Nasopharyngeal(NP) swabs in vial transport medium  Result Value Ref Range Status   SARS Coronavirus 2 by RT PCR NEGATIVE NEGATIVE Final    Comment: (NOTE) SARS-CoV-2 target nucleic acids are NOT DETECTED.  The SARS-CoV-2 RNA is generally detectable in upper respiratory specimens during the acute phase of  infection. The lowest concentration of SARS-CoV-2 viral copies this assay can detect is 138 copies/mL. A negative result does not preclude SARS-Cov-2 infection and should not be used as the sole basis for treatment or other patient management decisions. A negative result may occur with  improper specimen collection/handling, submission of specimen other than nasopharyngeal swab, presence of viral mutation(s) within the areas targeted by this assay, and inadequate number of viral copies(<138 copies/mL). A negative result must be combined with clinical observations, patient history, and epidemiological information. The expected result is Negative.  Fact Sheet for Patients:  EntrepreneurPulse.com.au  Fact Sheet for Healthcare Providers:  IncredibleEmployment.be  This test is no t yet approved or cleared by the [place] FDA and  has been authorized for detection and/or diagnosis of SARS-CoV-2 by FDA under an Emergency Use Authorization (EUA). This EUA will remain  in effect (meaning this test can be used) for the duration of the COVID-19 declaration under Section 564(b)(1) of the Act, 21 U.S.C.section 360bbb-3(b)(1), unless the authorization is terminated  or revoked sooner.       Influenza A by PCR NEGATIVE NEGATIVE Final   Influenza B by PCR NEGATIVE NEGATIVE Final    Comment: (NOTE) The Xpert Xpress SARS-CoV-2/FLU/RSV plus assay is intended as an aid in the diagnosis of influenza from Nasopharyngeal swab specimens and should not be used as a sole basis for treatment. Nasal washings and aspirates are unacceptable for Xpert Xpress SARS-CoV-2/FLU/RSV testing.  Fact Sheet for Patients: EntrepreneurPulse.com.au  Fact Sheet for Healthcare Providers: IncredibleEmployment.be  This test is not yet approved or cleared by the [place] FDA and has been authorized for detection and/or diagnosis of  SARS-CoV-2  by FDA under an Emergency Use Authorization (EUA). This EUA will remain in effect (meaning this test can be used) for the duration of the COVID-19 declaration under Section 564(b)(1) of the Act, 21 U.S.C. section 360bbb-3(b)(1), unless the authorization is terminated or revoked.  Performed at [facility], [address], [place], [place] [address]      Radiological Exams on Admission: DG Chest 1 View  Result Date: 05/05/2021 CLINICAL DATA:  Dizziness. EXAM: CHEST  1 VIEW COMPARISON:  05/13/2017, CT 05/11/2020 FINDINGS: Lung volumes are low.The cardiomediastinal contours are normal. Minor bibasilar atelectasis. Pulmonary vasculature is normal. No consolidation, pleural effusion, or pneumothorax. Surgical hardware in the lower cervical spine is partially included. No acute osseous abnormalities are seen. IMPRESSION: Low lung volumes with bibasilar atelectasis. Electronically Signed   By: Keith Rake M.D.   On: 05/05/2021 21:47   CT HEAD WO CONTRAST (5MM)  Result Date: 05/05/2021 CLINICAL DATA:  Dizziness EXAM: CT HEAD WITHOUT CONTRAST TECHNIQUE: Contiguous axial images were obtained from the base of the skull through the vertex without intravenous contrast. COMPARISON:  02/17/2018 FINDINGS: Brain: No evidence of acute infarction, hemorrhage, hydrocephalus, extra-axial collection or mass lesion/mass effect. Chronic atrophic and ischemic changes are noted. Vascular: No hyperdense vessel or unexpected calcification. Skull: Normal. Negative for fracture or focal lesion. Sinuses/Orbits: No acute finding. Other: None. IMPRESSION: Chronic atrophic and ischemic changes without acute abnormality. Electronically Signed   By: Inez Catalina M.D.   On: 05/05/2021 21:39   CT Angio Chest PE W and/or Wo Contrast  Result Date: 05/06/2021 CLINICAL DATA:  Dizziness with difficulty breathing. EXAM: CT ANGIOGRAPHY CHEST CT ABDOMEN AND PELVIS WITH CONTRAST TECHNIQUE: Multidetector CT imaging of the chest was  performed using the standard protocol during bolus administration of intravenous contrast. Multiplanar CT image reconstructions and MIPs were obtained to evaluate the vascular anatomy. Multidetector CT imaging of the abdomen and pelvis was performed using the standard protocol during bolus administration of intravenous contrast. CONTRAST:  92m OMNIPAQUE IOHEXOL 350 MG/ML SOLN COMPARISON:  None. FINDINGS: CTA CHEST FINDINGS Cardiovascular: There is marked severity calcification of the aortic arch without evidence of aortic aneurysm. Satisfactory opacification of the pulmonary arteries to the segmental level. No evidence of pulmonary embolism. Normal heart size. No pericardial effusion. Mediastinum/Nodes: No enlarged mediastinal, hilar, or axillary lymph nodes. Thyroid gland, trachea, and esophagus demonstrate no significant findings. Lungs/Pleura: Mild atelectasis is seen within the right lung base. There is no evidence of a pleural effusion or pneumothorax. Musculoskeletal: Multilevel degenerative changes seen throughout the thoracic spine. Review of the MIP images confirms the above findings. CT ABDOMEN and PELVIS FINDINGS Hepatobiliary: There is diffuse fatty infiltration of the liver parenchyma. No focal liver abnormality is seen. Subcentimeter gallstones are seen within the lumen of a contracted gallbladder. There is no evidence of gallbladder wall thickening or biliary dilatation. Pancreas: Unremarkable. No pancreatic ductal dilatation or surrounding inflammatory changes. Spleen: Normal in size without focal abnormality. Adrenals/Urinary Tract: Adrenal glands are unremarkable. Kidneys are normal in size. Multiple bilateral simple renal cysts of various sizes are seen. The largest measures approximately 4.0 cm x 3.7 cm and is noted within the lower pole of the left kidney moderate to marked severity left-sided hydronephrosis and hydroureter are seen, to the level of the distal left ureter. Mild right-sided  hydronephrosis and hydroureter are also noted. The urinary bladder is mildly distended. Mild diffuse urinary bladder wall thickening is noted. Stomach/Bowel: Stomach is within normal limits. Appendix appears normal. No evidence of  bowel dilatation. Noninflamed diverticula are seen within the descending and proximal sigmoid colon. Vascular/Lymphatic: Aortic atherosclerosis. No enlarged abdominal or pelvic lymph nodes. Reproductive: The prostate gland is surgically absent. A penile pump and associated reservoir are seen. Other: No abdominal wall hernia or abnormality. No abdominopelvic ascites. Musculoskeletal: Multilevel degenerative changes are seen throughout the lumbar spine. Review of the MIP images confirms the above findings. IMPRESSION: 1. No evidence of pulmonary embolism. 2. Mild right basilar atelectasis. 3. Cholelithiasis. 4. Bilateral hydronephrosis and hydroureter, left greater than right, without evidence of obstructing renal calculi. 5. Multiple bilateral simple renal cysts. 6. Colonic diverticulosis. Electronically Signed   By: Virgina Norfolk M.D.   On: 05/06/2021 00:29   CT ABDOMEN PELVIS W CONTRAST  Result Date: 05/06/2021 CLINICAL DATA:  Dizziness with difficulty breathing. EXAM: CT ANGIOGRAPHY CHEST CT ABDOMEN AND PELVIS WITH CONTRAST TECHNIQUE: Multidetector CT imaging of the chest was performed using the standard protocol during bolus administration of intravenous contrast. Multiplanar CT image reconstructions and MIPs were obtained to evaluate the vascular anatomy. Multidetector CT imaging of the abdomen and pelvis was performed using the standard protocol during bolus administration of intravenous contrast. CONTRAST:  54m OMNIPAQUE IOHEXOL 350 MG/ML SOLN COMPARISON:  August 19, 2016 FINDINGS: CTA CHEST FINDINGS Cardiovascular: There is marked severity calcification of the aortic arch without evidence of aortic aneurysm. Satisfactory opacification of the pulmonary arteries to the  segmental level. No evidence of pulmonary embolism. Normal heart size. No pericardial effusion. Mediastinum/Nodes: No enlarged mediastinal, hilar, or axillary lymph nodes. Thyroid gland, trachea, and esophagus demonstrate no significant findings. Lungs/Pleura: Mild atelectasis is seen within the right lung base. There is no evidence of a pleural effusion or pneumothorax. Musculoskeletal: Multilevel degenerative changes seen throughout the thoracic spine. Review of the MIP images confirms the above findings. CT ABDOMEN and PELVIS FINDINGS Hepatobiliary: There is diffuse fatty infiltration of the liver parenchyma. No focal liver abnormality is seen. Subcentimeter gallstones are seen within the lumen of a contracted gallbladder. There is no evidence of gallbladder wall thickening or biliary dilatation. Pancreas: Unremarkable. No pancreatic ductal dilatation or surrounding inflammatory changes. Spleen: Normal in size without focal abnormality. Adrenals/Urinary Tract: Adrenal glands are unremarkable. Kidneys are normal in size. Multiple bilateral simple renal cysts of various sizes are seen. The largest measures approximately 4.0 cm x 3.7 cm and is noted within the lower pole of the left kidney moderate to marked severity left-sided hydronephrosis and hydroureter are seen, to the level of the distal left ureter. Mild right-sided hydronephrosis and hydroureter are also noted. The urinary bladder is mildly distended. Mild diffuse urinary bladder wall thickening is noted. Stomach/Bowel: Stomach is within normal limits. Appendix appears normal. No evidence of bowel dilatation. Noninflamed diverticula are seen within the descending and proximal sigmoid colon. Vascular/Lymphatic: Aortic atherosclerosis. No enlarged abdominal or pelvic lymph nodes. Reproductive: The prostate gland is surgically absent. A penile pump and associated reservoir are seen. Other: No abdominal wall hernia or abnormality. No abdominopelvic ascites.  Musculoskeletal: Multilevel degenerative changes are seen throughout the lumbar spine. Review of the MIP images confirms the above findings. IMPRESSION: 1. No evidence of pulmonary embolism. 2. Mild right basilar atelectasis. 3. Cholelithiasis. 4. Bilateral hydronephrosis and hydroureter, left greater than right, without evidence of obstructing renal calculi. 5. Multiple bilateral simple renal cysts. 6. Colonic diverticulosis. Electronically Signed   By: TVirgina NorfolkM.D.   On: 05/06/2021 00:28     Assessment/Plan   1. Acute asthma exacerbation; acute hypoxic respiratory failure  - Presents  with balance problems, painful urination, and increased cough and wheeze and is found to have new 2 lpm supplemental O2 requirement  - Treat with bronchodilators and systemic steroids, wean supplemental O2 as tolerated    2. Balance problems  - Pt reports difficulty ambulating for a few days, feels like he will fall over and keeps hand on a wall  - No focal numbness or weakness and no acute findings on head CT  - Check MRI brain    3. UTI  - Follow cultures and treat with Rocephin for now    4. CKD IIIb; bilateral hydronephrosis  - SCr is 2.09 on admission, up from 1.52 in 2019  - There is bilateral hydroureteronephrosis on CT no obstructing stones, surgically absent prostate  - Renally-dose medications, gently hydrate with IVF, bladder scan and I/O cath if needed (will need to squeeze valve in scrotum and use small caliber catheter)   5. Thrombocytopenia  - Platelets 112,000 on admission without bleeding  - Appears chronic/stable    6. Insulin-dependent DM  - Continue CBGs and insulin    DVT prophylaxis: SCDs  Code Status: Full  Level of Care: Level of care: Telemetry Family Communication: None present  Disposition Plan:  Patient is from: Home  Anticipated d/c is to: Home  Anticipated d/c date is: Possibly as early as 05/07/21 Patient currently: Pending improvement in respiratory status,  MRI brain  Consults called: None  Admission status: Observation     Vianne Bulls, MD [facility]  05/06/2021, 2:59 AM

## 2021-05-06 NOTE — Plan of Care (Signed)
  Problem: Education: Goal: Knowledge of General Education information will improve Description Including pain rating scale, medication(s)/side effects and non-pharmacologic comfort measures Outcome: Progressing   Problem: Health Behavior/Discharge Planning: Goal: Ability to manage health-related needs will improve Outcome: Progressing   

## 2021-05-06 NOTE — Discharge Summary (Signed)
Physician Discharge Summary  [name] QQP:[id number] DOB: Jan 21, 1935 DOA: 05/05/2021  PCP: Brandon Squibb, MD  Admit date: 05/05/2021 Discharge date: 05/06/2021  Admitted From: Home Disposition:  Home  Recommendations for Outpatient Follow-up:  Follow up with PCP in 1-2 weeks  Discharge Condition:Improved CODE STATUS:Full Diet recommendation: Diabetic, Cardiac   Brief/Interim Summary: 86 y.o. male with medical history significant for asthma, hypertension, insulin-dependent diabetes mellitus, OSA not using CPAP, prostate cancer status post prostatectomy, and chronic kidney disease IIIb, now presenting to the emergency department with balance difficulty, cough, shortness of breath, wheezing, and painful urination.  The patient was started on antibiotics recently for dysuria but then developed difficulty ambulating, feeling as though he is going to fall over, and holding onto a wall while he walks.  He stopped the antibiotic due to concern that his balance issues were from the medication.  He denies flank pain or fevers.  He has also had increased cough and shortness of breath.  Breathing improves temporarily with inhalers at home.  He denies chest pain or leg swelling  Discharge Diagnoses:  Principal Problem:   Imbalance Active Problems:   Hypertension   Thrombocytopenia, unspecified (HCC)   Chronic kidney disease, stage 3b (HCC)   Chronic asthma vs UACS    Hydronephrosis   Acute respiratory failure with hypoxia (HCC)   Type 2 diabetes mellitus (HCC)   Acute asthma exacerbation  1. Acute asthma exacerbation; acute hypoxic respiratory failure  - Presents with balance problems, painful urination, and increased cough and wheeze and is found to have new 2 lpm supplemental O2 requirement  - Treat with bronchodilators and systemic steroids, successfully weaned to room air. Pt ambulated on room air without difficulty   2. Balance problems  - Pt reports difficulty ambulating for a few  days, feels like he will fall over and keeps hand on a wall  - No focal numbness or weakness and no acute findings on head CT  - Ambulated with patient in hallway without difficulty - Suspect related to below UTI     3. Ecoli UTI  - UA suggestive of UTI - Urine culture is pos for ecoli - clinically improved overnight with rocephin, would complete course of omnicef on d/c   4. CKD IIIb; bilateral hydronephrosis  - SCr is 2.09 on admission, up from 1.52 in 2019  - There is bilateral hydroureteronephrosis on CT no obstructing stones, surgically absent prostate  - given gentile IVF   5. Thrombocytopenia  - Platelets 112,000 on admission without bleeding  - Appears chronic/stable     6. Insulin-dependent DM  - Continue CBGs and insulin  -Resume home regimen on d/c    Discharge Instructions   Allergies as of 05/06/2021       Reactions   Bee Venom Anaphylaxis   Adhesive [tape] Hives   Ciprofloxacin    Unknown    Codeine    Unknown    Iodine    Unknown    Latex    Unknown    Povidone-iodine    Unknown    Simvastatin    Unknown         Medication List     STOP taking these medications    cephALEXin 500 MG capsule Commonly known as: KEFLEX   sulfamethoxazole-trimethoprim 800-160 MG tablet Commonly known as: BACTRIM DS       TAKE these medications    Accu-Chek FastClix Lancets Misc Apply topically.   Accu-Chek Guide test strip Generic drug: glucose blood  acetaminophen 325 MG tablet Commonly known as: TYLENOL Take 650 mg by mouth every 4 (four) hours as needed for mild pain or moderate pain. Take 2 tablets 1 hour prior to Rituxan treatment.   albuterol 108 (90 Base) MCG/ACT inhaler Commonly known as: ProAir HFA Inhale 2 puffs into the lungs at bedtime as needed for wheezing or shortness of breath.   amLODipine 5 MG tablet Commonly known as: NORVASC Take 1 tablet (5 mg total) by mouth daily.   aspirin EC 81 MG tablet Take 1 tablet (81 mg total)  by mouth daily. Swallow whole.   atorvastatin 20 MG tablet Commonly known as: LIPITOR Take 20 mg by mouth daily.   budesonide-formoterol 160-4.5 MCG/ACT inhaler Commonly known as: SYMBICORT Inhale 2 puffs into the lungs 2 (two) times daily.   cefdinir 300 MG capsule Commonly known as: OMNICEF Take 1 capsule (300 mg total) by mouth 2 (two) times daily for 5 days.   docusate sodium 100 MG capsule Commonly known as: COLACE Take 200 mg by mouth daily as needed for mild constipation.   fluticasone 50 MCG/ACT nasal spray Commonly known as: FLONASE Place 1 spray into both nostrils as needed for allergies or rhinitis.   gabapentin 300 MG capsule Commonly known as: NEURONTIN Take 1 cap in AM and 2 caps at night (300AM, 600PM)   glipiZIDE 10 MG tablet Commonly known as: GLUCOTROL Take 10 mg by mouth 2 (two) times daily before a meal.   hydrocortisone 2.5 % cream Apply 1 application topically as needed (itching).   insulin lispro 100 UNIT/ML cartridge Commonly known as: HUMALOG Inject 1-10 Units into the skin as directed. On sliding scale when eating a big meal   ipratropium 0.06 % nasal spray Commonly known as: ATROVENT Place 2 sprays into both nostrils 2 (two) times daily.   meclizine 25 MG tablet Commonly known as: ANTIVERT Take 25 mg by mouth 2 (two) times daily as needed for dizziness.   neomycin-polymyxin b-dexamethasone 3.5-10000-0.1 Oint Commonly known as: MAXITROL   ondansetron 4 MG tablet Commonly known as: ZOFRAN Take by mouth as needed.   pantoprazole 40 MG tablet Commonly known as: PROTONIX TAKE 1 TABLET BY MOUTH 2 TIMES DAILY BEFORE A MEAL.   predniSONE 10 MG tablet Commonly known as: DELTASONE Take 4 tablets (40 mg total) by mouth daily for 5 days.   propranolol ER 120 MG 24 hr capsule Commonly known as: Inderal LA Take 1 capsule (120 mg total) by mouth at bedtime. For tremor.   temazepam 15 MG capsule Commonly known as: RESTORIL Take 15 mg by  mouth at bedtime as needed.   testosterone cypionate 200 MG/ML injection Commonly known as: DEPOTESTOSTERONE CYPIONATE Inject 200 mg into the muscle every 14 (fourteen) days.   Toujeo SoloStar 300 UNIT/ML Solostar Pen Generic drug: insulin glargine (1 Unit Dial) Inject 70 Units into the skin every morning.        Follow-up Information     Brandon Squibb, MD Follow up in 1 week(s).   Specialty: Internal Medicine Why: Hospital follow up Contact information: [facility] [address] [phone]         Satira Sark, MD .   Specialty: Cardiology Contact information: [place] [address] [phone]                Allergies  Allergen Reactions   Bee Venom Anaphylaxis   Adhesive [Tape] Hives   Ciprofloxacin     Unknown  Codeine     Unknown     Iodine     Unknown     Latex     Unknown     Povidone-Iodine     Unknown     Simvastatin     Unknown      Procedures/Studies: DG Chest 1 View  Result Date: 05/05/2021 CLINICAL DATA:  Dizziness. EXAM: CHEST  1 VIEW COMPARISON:  05/13/2017, CT 05/11/2020 FINDINGS: Lung volumes are low.The cardiomediastinal contours are normal. Minor bibasilar atelectasis. Pulmonary vasculature is normal. No consolidation, pleural effusion, or pneumothorax. Surgical hardware in the lower cervical spine is partially included. No acute osseous abnormalities are seen. IMPRESSION: Low lung volumes with bibasilar atelectasis. Electronically Signed   By: Keith Rake M.D.   On: 05/05/2021 21:47   CT HEAD WO CONTRAST (5MM)  Result Date: 05/05/2021 CLINICAL DATA:  Dizziness EXAM: CT HEAD WITHOUT CONTRAST TECHNIQUE: Contiguous axial images were obtained from the base of the skull through the vertex without intravenous contrast. COMPARISON:  02/17/2018 FINDINGS: Brain: No evidence of acute infarction, hemorrhage, hydrocephalus, extra-axial collection or mass lesion/mass effect. Chronic atrophic  and ischemic changes are noted. Vascular: No hyperdense vessel or unexpected calcification. Skull: Normal. Negative for fracture or focal lesion. Sinuses/Orbits: No acute finding. Other: None. IMPRESSION: Chronic atrophic and ischemic changes without acute abnormality. Electronically Signed   By: Inez Catalina M.D.   On: 05/05/2021 21:39   CT Angio Chest PE W and/or Wo Contrast  Result Date: 05/06/2021 CLINICAL DATA:  Dizziness with difficulty breathing. EXAM: CT ANGIOGRAPHY CHEST CT ABDOMEN AND PELVIS WITH CONTRAST TECHNIQUE: Multidetector CT imaging of the chest was performed using the standard protocol during bolus administration of intravenous contrast. Multiplanar CT image reconstructions and MIPs were obtained to evaluate the vascular anatomy. Multidetector CT imaging of the abdomen and pelvis was performed using the standard protocol during bolus administration of intravenous contrast. CONTRAST:  40m OMNIPAQUE IOHEXOL 350 MG/ML SOLN COMPARISON:  None. FINDINGS: CTA CHEST FINDINGS Cardiovascular: There is marked severity calcification of the aortic arch without evidence of aortic aneurysm. Satisfactory opacification of the pulmonary arteries to the segmental level. No evidence of pulmonary embolism. Normal heart size. No pericardial effusion. Mediastinum/Nodes: No enlarged mediastinal, hilar, or axillary lymph nodes. Thyroid gland, trachea, and esophagus demonstrate no significant findings. Lungs/Pleura: Mild atelectasis is seen within the right lung base. There is no evidence of a pleural effusion or pneumothorax. Musculoskeletal: Multilevel degenerative changes seen throughout the thoracic spine. Review of the MIP images confirms the above findings. CT ABDOMEN and PELVIS FINDINGS Hepatobiliary: There is diffuse fatty infiltration of the liver parenchyma. No focal liver abnormality is seen. Subcentimeter gallstones are seen within the lumen of a contracted gallbladder. There is no evidence of gallbladder  wall thickening or biliary dilatation. Pancreas: Unremarkable. No pancreatic ductal dilatation or surrounding inflammatory changes. Spleen: Normal in size without focal abnormality. Adrenals/Urinary Tract: Adrenal glands are unremarkable. Kidneys are normal in size. Multiple bilateral simple renal cysts of various sizes are seen. The largest measures approximately 4.0 cm x 3.7 cm and is noted within the lower pole of the left kidney moderate to marked severity left-sided hydronephrosis and hydroureter are seen, to the level of the distal left ureter. Mild right-sided hydronephrosis and hydroureter are also noted. The urinary bladder is mildly distended. Mild diffuse urinary bladder wall thickening is noted. Stomach/Bowel: Stomach is within normal limits. Appendix appears normal. No evidence of bowel dilatation. Noninflamed diverticula are seen within the descending and proximal sigmoid colon. Vascular/Lymphatic: Aortic  atherosclerosis. No enlarged abdominal or pelvic lymph nodes. Reproductive: The prostate gland is surgically absent. A penile pump and associated reservoir are seen. Other: No abdominal wall hernia or abnormality. No abdominopelvic ascites. Musculoskeletal: Multilevel degenerative changes are seen throughout the lumbar spine. Review of the MIP images confirms the above findings. IMPRESSION: 1. No evidence of pulmonary embolism. 2. Mild right basilar atelectasis. 3. Cholelithiasis. 4. Bilateral hydronephrosis and hydroureter, left greater than right, without evidence of obstructing renal calculi. 5. Multiple bilateral simple renal cysts. 6. Colonic diverticulosis. Electronically Signed   By: Virgina Norfolk M.D.   On: 05/06/2021 00:29   CT ABDOMEN PELVIS W CONTRAST  Result Date: 05/06/2021 CLINICAL DATA:  Dizziness with difficulty breathing. EXAM: CT ANGIOGRAPHY CHEST CT ABDOMEN AND PELVIS WITH CONTRAST TECHNIQUE: Multidetector CT imaging of the chest was performed using the standard protocol  during bolus administration of intravenous contrast. Multiplanar CT image reconstructions and MIPs were obtained to evaluate the vascular anatomy. Multidetector CT imaging of the abdomen and pelvis was performed using the standard protocol during bolus administration of intravenous contrast. CONTRAST:  34m OMNIPAQUE IOHEXOL 350 MG/ML SOLN COMPARISON:  August 19, 2016 FINDINGS: CTA CHEST FINDINGS Cardiovascular: There is marked severity calcification of the aortic arch without evidence of aortic aneurysm. Satisfactory opacification of the pulmonary arteries to the segmental level. No evidence of pulmonary embolism. Normal heart size. No pericardial effusion. Mediastinum/Nodes: No enlarged mediastinal, hilar, or axillary lymph nodes. Thyroid gland, trachea, and esophagus demonstrate no significant findings. Lungs/Pleura: Mild atelectasis is seen within the right lung base. There is no evidence of a pleural effusion or pneumothorax. Musculoskeletal: Multilevel degenerative changes seen throughout the thoracic spine. Review of the MIP images confirms the above findings. CT ABDOMEN and PELVIS FINDINGS Hepatobiliary: There is diffuse fatty infiltration of the liver parenchyma. No focal liver abnormality is seen. Subcentimeter gallstones are seen within the lumen of a contracted gallbladder. There is no evidence of gallbladder wall thickening or biliary dilatation. Pancreas: Unremarkable. No pancreatic ductal dilatation or surrounding inflammatory changes. Spleen: Normal in size without focal abnormality. Adrenals/Urinary Tract: Adrenal glands are unremarkable. Kidneys are normal in size. Multiple bilateral simple renal cysts of various sizes are seen. The largest measures approximately 4.0 cm x 3.7 cm and is noted within the lower pole of the left kidney moderate to marked severity left-sided hydronephrosis and hydroureter are seen, to the level of the distal left ureter. Mild right-sided hydronephrosis and hydroureter  are also noted. The urinary bladder is mildly distended. Mild diffuse urinary bladder wall thickening is noted. Stomach/Bowel: Stomach is within normal limits. Appendix appears normal. No evidence of bowel dilatation. Noninflamed diverticula are seen within the descending and proximal sigmoid colon. Vascular/Lymphatic: Aortic atherosclerosis. No enlarged abdominal or pelvic lymph nodes. Reproductive: The prostate gland is surgically absent. A penile pump and associated reservoir are seen. Other: No abdominal wall hernia or abnormality. No abdominopelvic ascites. Musculoskeletal: Multilevel degenerative changes are seen throughout the lumbar spine. Review of the MIP images confirms the above findings. IMPRESSION: 1. No evidence of pulmonary embolism. 2. Mild right basilar atelectasis. 3. Cholelithiasis. 4. Bilateral hydronephrosis and hydroureter, left greater than right, without evidence of obstructing renal calculi. 5. Multiple bilateral simple renal cysts. 6. Colonic diverticulosis. Electronically Signed   By: TVirgina NorfolkM.D.   On: 05/06/2021 00:28    Subjective: Very eager to go home  Discharge Exam: Vitals:   05/06/21 0400 05/06/21 0441  BP: 139/83 (!) 159/89  Pulse: 74 79  Resp: 18 18  Temp:  (!) 97.3 F (36.3 C)  SpO2: 96% 97%   Vitals:   05/06/21 0130 05/06/21 0319 05/06/21 0400 05/06/21 0441  BP: (!) 164/97  139/83 (!) 159/89  Pulse: 90  74 79  Resp: 20  18 18   Temp:    (!) 97.3 F (36.3 C)  TempSrc:    Oral  SpO2: 98% 98% 96% 97%  Weight:      Height:        General: Pt is alert, awake, not in acute distress Cardiovascular: RRR, S1/S2 + Respiratory: CTA bilaterally, no wheezing, no rhonchi Abdominal: Soft, NT, ND, bowel sounds + Extremities: no edema, no cyanosis   The results of significant diagnostics from this hospitalization (including imaging, microbiology, ancillary and laboratory) are listed below for reference.     Microbiology: Recent Results (from the  past 240 hour(s))  Urine Culture     Status: Abnormal (Preliminary result)   Collection Time: 05/04/21  5:04 PM   Specimen: Urine, Clean Catch  Result Value Ref Range Status   Specimen Description   Final    URINE, CLEAN CATCH Performed at [facility], [address], [place], [place] [address]    Special Requests   Final    NONE Performed at [facility], [address] [place], [place] [address]    Culture >=100,000 COLONIES/mL ESCHERICHIA COLI (A)  Final   Report Status PENDING  Incomplete  Resp Panel by RT-PCR (Flu A&B, Covid) Nasopharyngeal Swab     Status: None   Collection Time: 05/05/21 10:28 PM   Specimen: Nasopharyngeal Swab; Nasopharyngeal(NP) swabs in vial transport medium  Result Value Ref Range Status   SARS Coronavirus 2 by RT PCR NEGATIVE NEGATIVE Final    Comment: (NOTE) SARS-CoV-2 target nucleic acids are NOT DETECTED.  The SARS-CoV-2 RNA is generally detectable in upper respiratory specimens during the acute phase of infection. The lowest concentration of SARS-CoV-2 viral copies this assay can detect is 138 copies/mL. A negative result does not preclude SARS-Cov-2 infection and should not be used as the sole basis for treatment or other patient management decisions. A negative result may occur with  improper specimen collection/handling, submission of specimen other than nasopharyngeal swab, presence of viral mutation(s) within the areas targeted by this assay, and inadequate number of viral copies(<138 copies/mL). A negative result must be combined with clinical observations, patient history, and epidemiological information. The expected result is Negative.  Fact Sheet for Patients:  EntrepreneurPulse.com.au  Fact Sheet for Healthcare Providers:  IncredibleEmployment.be  This test is no t yet approved or cleared by the [place] FDA and  has been authorized for detection and/or diagnosis of SARS-CoV-2 by FDA under  an Emergency Use Authorization (EUA). This EUA will remain  in effect (meaning this test can be used) for the duration of the COVID-19 declaration under Section 564(b)(1) of the Act, 21 U.S.C.section 360bbb-3(b)(1), unless the authorization is terminated  or revoked sooner.       Influenza A by PCR NEGATIVE NEGATIVE Final   Influenza B by PCR NEGATIVE NEGATIVE Final    Comment: (NOTE) The Xpert Xpress SARS-CoV-2/FLU/RSV plus assay is intended as an aid in the diagnosis of influenza from Nasopharyngeal swab specimens and should not be used as a sole basis for treatment. Nasal washings and aspirates are unacceptable for Xpert Xpress SARS-CoV-2/FLU/RSV testing.  Fact Sheet for Patients: EntrepreneurPulse.com.au  Fact Sheet for Healthcare Providers: IncredibleEmployment.be  This test is not yet approved or cleared by the [place] and  has been authorized for detection and/or diagnosis of SARS-CoV-2 by FDA under an Emergency Use Authorization (EUA). This EUA will remain in effect (meaning this test can be used) for the duration of the COVID-19 declaration under Section 564(b)(1) of the Act, 21 U.S.C. section 360bbb-3(b)(1), unless the authorization is terminated or revoked.  Performed at [facility], [address], [place], [place] [address]      Labs: BNP (last 3 results) No results for input(s): BNP in the last 8760 hours. Basic Metabolic Panel: Recent Labs  Lab 05/05/21 2216 05/06/21 0605  NA 133* 134*  K 4.6 4.6  CL 100 97*  CO2 25 26  GLUCOSE 219* 268*  BUN 28* 29*  CREATININE 2.09* 1.90*  CALCIUM 8.5* 8.5*  MG 1.6*  --    Liver Function Tests: Recent Labs  Lab 05/05/21 2216  AST 27  ALT 15  ALKPHOS 58  BILITOT 1.6*  PROT 6.7  ALBUMIN 3.6   No results for input(s): LIPASE, AMYLASE in the last 168 hours. No results for input(s): AMMONIA in the last 168 hours. CBC: Recent Labs  Lab 05/05/21 2216  05/06/21 0605  WBC 7.7 6.9  NEUTROABS 6.2  --   HGB 15.1 15.2  HCT 45.5 47.0  MCV 92.5 95.1  PLT 112* 112*   Cardiac Enzymes: No results for input(s): CKTOTAL, CKMB, CKMBINDEX, TROPONINI in the last 168 hours. BNP: Invalid input(s): POCBNP CBG: Recent Labs  Lab 05/06/21 0649  GLUCAP 286*   D-Dimer No results for input(s): DDIMER in the last 72 hours. Hgb A1c No results for input(s): HGBA1C in the last 72 hours. Lipid Profile No results for input(s): CHOL, HDL, LDLCALC, TRIG, CHOLHDL, LDLDIRECT in the last 72 hours. Thyroid function studies No results for input(s): TSH, T4TOTAL, T3FREE, THYROIDAB in the last 72 hours.  Invalid input(s): FREET3 Anemia work up No results for input(s): VITAMINB12, FOLATE, FERRITIN, TIBC, IRON, RETICCTPCT in the last 72 hours. Urinalysis    Component Value Date/Time   COLORURINE YELLOW 05/05/2021 2220   APPEARANCEUR CLEAR 05/05/2021 2220   LABSPEC 1.011 05/05/2021 2220   PHURINE 5.0 05/05/2021 2220   GLUCOSEU 150 (A) 05/05/2021 2220   HGBUR MODERATE (A) 05/05/2021 2220   BILIRUBINUR NEGATIVE 05/05/2021 2220   BILIRUBINUR negative 05/04/2021 1702   KETONESUR NEGATIVE 05/05/2021 2220   PROTEINUR NEGATIVE 05/05/2021 2220   UROBILINOGEN 1.0 05/04/2021 1702   UROBILINOGEN 0.2 06/30/2015 1552   NITRITE NEGATIVE 05/05/2021 2220   LEUKOCYTESUR MODERATE (A) 05/05/2021 2220   Sepsis Labs Invalid input(s): PROCALCITONIN,  WBC,  LACTICIDVEN Microbiology Recent Results (from the past 240 hour(s))  Urine Culture     Status: Abnormal (Preliminary result)   Collection Time: 05/04/21  5:04 PM   Specimen: Urine, Clean Catch  Result Value Ref Range Status   Specimen Description   Final    URINE, CLEAN CATCH Performed at [facility], [address], [place], [place] [address]    Special Requests   Final    NONE Performed at [facility], [address], [place], [place] [address]    Culture >=100,000 COLONIES/mL ESCHERICHIA COLI (A)  Final    Report Status PENDING  Incomplete  Resp Panel by RT-PCR (Flu A&B, Covid) Nasopharyngeal Swab     Status: None   Collection Time: 05/05/21 10:28 PM   Specimen: Nasopharyngeal Swab; Nasopharyngeal(NP) swabs in vial transport medium  Result Value Ref Range Status   SARS Coronavirus 2 by RT PCR NEGATIVE NEGATIVE Final    Comment: (NOTE) SARS-CoV-2  target nucleic acids are NOT DETECTED.  The SARS-CoV-2 RNA is generally detectable in upper respiratory specimens during the acute phase of infection. The lowest concentration of SARS-CoV-2 viral copies this assay can detect is 138 copies/mL. A negative result does not preclude SARS-Cov-2 infection and should not be used as the sole basis for treatment or other patient management decisions. A negative result may occur with  improper specimen collection/handling, submission of specimen other than nasopharyngeal swab, presence of viral mutation(s) within the areas targeted by this assay, and inadequate number of viral copies(<138 copies/mL). A negative result must be combined with clinical observations, patient history, and epidemiological information. The expected result is Negative.  Fact Sheet for Patients:  EntrepreneurPulse.com.au  Fact Sheet for Healthcare Providers:  IncredibleEmployment.be  This test is no t yet approved or cleared by the [place] FDA and  has been authorized for detection and/or diagnosis of SARS-CoV-2 by FDA under an Emergency Use Authorization (EUA). This EUA will remain  in effect (meaning this test can be used) for the duration of the COVID-19 declaration under Section 564(b)(1) of the Act, 21 U.S.C.section 360bbb-3(b)(1), unless the authorization is terminated  or revoked sooner.       Influenza A by PCR NEGATIVE NEGATIVE Final   Influenza B by PCR NEGATIVE NEGATIVE Final    Comment: (NOTE) The Xpert Xpress SARS-CoV-2/FLU/RSV plus assay is intended as an aid in the  diagnosis of influenza from Nasopharyngeal swab specimens and should not be used as a sole basis for treatment. Nasal washings and aspirates are unacceptable for Xpert Xpress SARS-CoV-2/FLU/RSV testing.  Fact Sheet for Patients: EntrepreneurPulse.com.au  Fact Sheet for Healthcare Providers: IncredibleEmployment.be  This test is not yet approved or cleared by the [place] FDA and has been authorized for detection and/or diagnosis of SARS-CoV-2 by FDA under an Emergency Use Authorization (EUA). This EUA will remain in effect (meaning this test can be used) for the duration of the COVID-19 declaration under Section 564(b)(1) of the Act, 21 U.S.C. section 360bbb-3(b)(1), unless the authorization is terminated or revoked.  Performed at [facility], [address] [place], [place] [address]    Time spent: 30 min  SIGNED:   Marylu Lund, MD  [facility] 05/06/2021, 10:39 AM  If 7PM-7AM, please contact night-coverage

## 2021-05-07 LAB — URINE CULTURE
Culture: >=100000 — AB
Culture: <10000 — AB

## 2021-05-08 ENCOUNTER — Telehealth: Payer: Self-pay | Admitting: Internal Medicine

## 2021-05-08 NOTE — Telephone Encounter (Signed)
Message received from [facility].  Dr. Melvyn Novas put in order for chest CT to be done in Aug. I sched for 8/26 and called pt. He states just got out of hosp. Had CT angio chest on 8/13. Does he still need this CT now? Would prefer to wait if ok with Dr. Melvyn Novas.   Message routed to Dr. Melvyn Novas to advise  LOV 05/12/20- Instructions  Sumbicort  160 1-2 puff each and  1-2 puffs each  - if worse take 2 every 12 hours until better for a week then taper       If you are satisfied with your treatment plan,  let your doctor know and he/she can either refill your medications or you can return here when your prescription runs out.      If in any way you are not 100% satisfied,  please tell us.  If 100% better, tell your friends!   Pulmonary follow up is as needed

## 2021-05-08 NOTE — Telephone Encounter (Signed)
Called patient but he did not answer. Left message for him to call us back tomorrow morning.

## 2021-05-08 NOTE — Telephone Encounter (Signed)
I reviewed it, no significant nodules, no f/u needed so ok to cancel upcoming ct

## 2021-05-09 DIAGNOSIS — R0789 Other chest pain: Secondary | ICD-10-CM | POA: Diagnosis not present

## 2021-05-09 DIAGNOSIS — E1122 Type 2 diabetes mellitus with diabetic chronic kidney disease: Secondary | ICD-10-CM | POA: Diagnosis not present

## 2021-05-09 DIAGNOSIS — N1832 Chronic kidney disease, stage 3b: Secondary | ICD-10-CM | POA: Diagnosis not present

## 2021-05-09 DIAGNOSIS — D6949 Other primary thrombocytopenia: Secondary | ICD-10-CM | POA: Diagnosis not present

## 2021-05-09 DIAGNOSIS — J45901 Unspecified asthma with (acute) exacerbation: Secondary | ICD-10-CM | POA: Diagnosis not present

## 2021-05-09 DIAGNOSIS — J9601 Acute respiratory failure with hypoxia: Secondary | ICD-10-CM | POA: Diagnosis not present

## 2021-05-09 DIAGNOSIS — N390 Urinary tract infection, site not specified: Secondary | ICD-10-CM | POA: Diagnosis not present

## 2021-05-09 DIAGNOSIS — M6281 Muscle weakness (generalized): Secondary | ICD-10-CM | POA: Diagnosis not present

## 2021-05-09 NOTE — Telephone Encounter (Signed)
I have cancelled the Ct but the patient stated he was told he had an appt with [facility] I do not see one

## 2021-05-09 NOTE — Telephone Encounter (Signed)
I called the pt and he is aware that he does not have an appt with MW and pt did not want to schedule one.  Nothing further is needed.

## 2021-05-18 ENCOUNTER — Ambulatory Visit (HOSPITAL_COMMUNITY): Payer: Medicare Other

## 2021-05-19 ENCOUNTER — Emergency Department (HOSPITAL_COMMUNITY)
Admission: EM | Admit: 2021-05-19 | Discharge: 2021-05-19 | Disposition: A | Discharge status: Home / Self Care | Payer: Medicare Other | Attending: Emergency Medicine | Admitting: Emergency Medicine

## 2021-05-19 ENCOUNTER — Other Ambulatory Visit: Payer: Self-pay

## 2021-05-19 ENCOUNTER — Encounter (HOSPITAL_COMMUNITY): Payer: Self-pay

## 2021-05-19 ENCOUNTER — Emergency Department (HOSPITAL_COMMUNITY): Payer: Medicare Other

## 2021-05-19 DIAGNOSIS — M549 Dorsalgia, unspecified: Secondary | ICD-10-CM | POA: Diagnosis not present

## 2021-05-19 DIAGNOSIS — J45901 Unspecified asthma with (acute) exacerbation: Secondary | ICD-10-CM | POA: Diagnosis not present

## 2021-05-19 DIAGNOSIS — E1122 Type 2 diabetes mellitus with diabetic chronic kidney disease: Secondary | ICD-10-CM | POA: Diagnosis not present

## 2021-05-19 DIAGNOSIS — R42 Dizziness and giddiness: Secondary | ICD-10-CM | POA: Diagnosis not present

## 2021-05-19 DIAGNOSIS — M25511 Pain in right shoulder: Secondary | ICD-10-CM | POA: Diagnosis not present

## 2021-05-19 DIAGNOSIS — M25512 Pain in left shoulder: Secondary | ICD-10-CM | POA: Diagnosis not present

## 2021-05-19 DIAGNOSIS — Z91040 Latex allergy status: Secondary | ICD-10-CM | POA: Insufficient documentation

## 2021-05-19 DIAGNOSIS — N1832 Chronic kidney disease, stage 3b: Secondary | ICD-10-CM | POA: Insufficient documentation

## 2021-05-19 DIAGNOSIS — I10 Essential (primary) hypertension: Secondary | ICD-10-CM | POA: Diagnosis not present

## 2021-05-19 DIAGNOSIS — R0602 Shortness of breath: Secondary | ICD-10-CM | POA: Diagnosis not present

## 2021-05-19 DIAGNOSIS — R1030 Lower abdominal pain, unspecified: Secondary | ICD-10-CM | POA: Diagnosis not present

## 2021-05-19 DIAGNOSIS — I129 Hypertensive chronic kidney disease with stage 1 through stage 4 chronic kidney disease, or unspecified chronic kidney disease: Secondary | ICD-10-CM | POA: Diagnosis not present

## 2021-05-19 DIAGNOSIS — U071 COVID-19: Secondary | ICD-10-CM | POA: Diagnosis not present

## 2021-05-19 DIAGNOSIS — J9811 Atelectasis: Secondary | ICD-10-CM | POA: Diagnosis not present

## 2021-05-19 DIAGNOSIS — Z8546 Personal history of malignant neoplasm of prostate: Secondary | ICD-10-CM | POA: Insufficient documentation

## 2021-05-19 DIAGNOSIS — R109 Unspecified abdominal pain: Secondary | ICD-10-CM | POA: Insufficient documentation

## 2021-05-19 DIAGNOSIS — Z85038 Personal history of other malignant neoplasm of large intestine: Secondary | ICD-10-CM | POA: Diagnosis not present

## 2021-05-19 DIAGNOSIS — E119 Type 2 diabetes mellitus without complications: Secondary | ICD-10-CM | POA: Diagnosis not present

## 2021-05-19 LAB — URINALYSIS, ROUTINE W REFLEX MICROSCOPIC
Bacteria, UA: NONE SEEN
Bilirubin Urine: NEGATIVE
Glucose, UA: >=500 mg/dL — AB
Ketones, ur: NEGATIVE mg/dL
Leukocytes,Ua: NEGATIVE
Nitrite: NEGATIVE
Protein, ur: NEGATIVE mg/dL
Specific Gravity, Urine: 1.014 (ref 1.005–1.030)
pH: 5 (ref 5.0–8.0)

## 2021-05-19 LAB — CBC WITH DIFFERENTIAL/PLATELET
Abs Immature Granulocytes: 0.06 10*3/uL (ref 0.00–0.07)
Basophils Absolute: 0 10*3/uL (ref 0.0–0.1)
Basophils Relative: 1 %
Eosinophils Absolute: 0 10*3/uL (ref 0.0–0.5)
Eosinophils Relative: 1 %
HCT: 51 % (ref 39.0–52.0)
Hemoglobin: 16.5 g/dL (ref 13.0–17.0)
Immature Granulocytes: 2 %
Lymphocytes Relative: 20 %
Lymphs Abs: 0.7 10*3/uL (ref 0.7–4.0)
MCH: 30.1 pg (ref 26.0–34.0)
MCHC: 32.4 g/dL (ref 30.0–36.0)
MCV: 93.1 fL (ref 80.0–100.0)
Monocytes Absolute: 0.7 10*3/uL (ref 0.1–1.0)
Monocytes Relative: 18 %
Neutro Abs: 2.2 10*3/uL (ref 1.7–7.7)
Neutrophils Relative %: 58 %
Platelets: 76 10*3/uL — ABNORMAL LOW (ref 150–400)
RBC: 5.48 MIL/uL (ref 4.22–5.81)
RDW: 15.8 % — ABNORMAL HIGH (ref 11.5–15.5)
WBC: 3.7 10*3/uL — ABNORMAL LOW (ref 4.0–10.5)
nRBC: 0 % (ref 0.0–0.2)

## 2021-05-19 LAB — RESP PANEL BY RT-PCR (FLU A&B, COVID) ARPGX2
Influenza A by PCR: NEGATIVE
Influenza B by PCR: NEGATIVE
SARS Coronavirus 2 by RT PCR: POSITIVE — AB

## 2021-05-19 LAB — COMPREHENSIVE METABOLIC PANEL
ALT: 26 U/L (ref 0–44)
AST: 35 U/L (ref 15–41)
Albumin: 3.7 g/dL (ref 3.5–5.0)
Alkaline Phosphatase: 60 U/L (ref 38–126)
Anion gap: 8 (ref 5–15)
BUN: 19 mg/dL (ref 8–23)
CO2: 30 mmol/L (ref 22–32)
Calcium: 8.5 mg/dL — ABNORMAL LOW (ref 8.9–10.3)
Chloride: 97 mmol/L — ABNORMAL LOW (ref 98–111)
Creatinine, Ser: 1.47 mg/dL — ABNORMAL HIGH (ref 0.61–1.24)
GFR, Estimated: 47 mL/min — ABNORMAL LOW (ref >60)
Glucose, Bld: 367 mg/dL — ABNORMAL HIGH (ref 70–99)
Potassium: 4 mmol/L (ref 3.5–5.1)
Sodium: 135 mmol/L (ref 135–145)
Total Bilirubin: 0.9 mg/dL (ref 0.3–1.2)
Total Protein: 6.4 g/dL — ABNORMAL LOW (ref 6.5–8.1)

## 2021-05-19 LAB — TROPONIN I (HIGH SENSITIVITY)
Troponin I (High Sensitivity): 22 ng/L — ABNORMAL HIGH (ref <18)
Troponin I (High Sensitivity): 25 ng/L — ABNORMAL HIGH (ref <18)

## 2021-05-19 LAB — LIPASE, BLOOD: Lipase: 33 U/L (ref 11–51)

## 2021-05-19 MED ORDER — OXYCODONE-ACETAMINOPHEN 5-325 MG PO TABS
1.0000 | ORAL_TABLET | Freq: Once | ORAL | Status: AC
  Administered 2021-05-19: 1 via ORAL
  Filled 2021-05-19: qty 1

## 2021-05-19 MED ORDER — NIRMATRELVIR/RITONAVIR (PAXLOVID) TABLET (RENAL DOSING)
2.0000 | ORAL_TABLET | Freq: Two times a day (BID) | ORAL | Status: DC
  Administered 2021-05-19: 2 via ORAL
  Filled 2021-05-19: qty 20

## 2021-05-19 MED ORDER — SODIUM CHLORIDE 0.9 % IV BOLUS
500.0000 mL | Freq: Once | INTRAVENOUS | Status: AC
  Administered 2021-05-19: 500 mL via INTRAVENOUS

## 2021-05-19 MED ORDER — ALBUTEROL SULFATE HFA 108 (90 BASE) MCG/ACT IN AERS
2.0000 | INHALATION_SPRAY | Freq: Once | RESPIRATORY_TRACT | Status: AC
  Administered 2021-05-19: 2 via RESPIRATORY_TRACT
  Filled 2021-05-19: qty 6.7

## 2021-05-19 MED ORDER — IOHEXOL 300 MG/ML  SOLN
80.0000 mL | Freq: Once | INTRAMUSCULAR | Status: AC | PRN
  Administered 2021-05-19: 80 mL via INTRAVENOUS

## 2021-05-19 MED ORDER — FENTANYL CITRATE PF 50 MCG/ML IJ SOSY
50.0000 ug | PREFILLED_SYRINGE | Freq: Once | INTRAMUSCULAR | Status: AC
  Administered 2021-05-19: 50 ug via INTRAVENOUS
  Filled 2021-05-19: qty 1

## 2021-05-19 NOTE — ED Notes (Signed)
Provider in room to review results, pt asking for something to help him breathe better. Doc to order inhaler

## 2021-05-19 NOTE — Discharge Instructions (Addendum)
You were seen in the ED today with back pain and fatigue. You have tested positive for COVID. I have starting you on antiviral medication. Call your PCP on Monday to schedule a follow up appointment.

## 2021-05-19 NOTE — ED Triage Notes (Signed)
Pt complaining of back pain, SOB, dizziness says "you didn't fixe me last time I was here", pain 10/10 back and lungs

## 2021-05-19 NOTE — ED Provider Notes (Signed)
Emergency Department Provider Note   I have reviewed the triage vital signs and the nursing notes.   HISTORY  Chief Complaint No chief complaint on file.   HPI [name] is a 86 y.o. male with PMH of CAD, GERD, HLD, DM, HTN, and CKD presents to the ED with pain in the back, SOB, and fatigue.  Patient was discharged from the hospital on 8/14 after presenting with similar symptoms.  He states that his pain in the back is throughout the entire back and within the shoulders to the hips.  Denies any ripping/tearing/sharp pain.  No pain worse on one side or the other.  States pain has been present since his last hospitalization and not significantly worsened or improved.  States he continues to feel off balance which again is not worse.  He has not had falls or head injuries.  He does not use a cane or walker.  Denies any dysuria, hesitancy, urgency.  No fevers or chills.  Denies anterior chest pain or abdominal pain.  No clear modifying factors.   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, anomalous circumflex - no PCIs   Essential hypertension, benign    GERD (gastroesophageal reflux disease)    Mixed hyperlipidemia    Obstructive sleep apnea    does not use   Prostate cancer ([place])    PVD (peripheral vascular disease) ([place])    Type 2 diabetes mellitus ([place])     Patient Active Problem List   Diagnosis Date Noted   Imbalance 05/06/2021   Hydronephrosis 05/06/2021   Acute respiratory failure with hypoxia ([place]) 05/06/2021   Acute asthma exacerbation 05/06/2021   Type 2 diabetes mellitus ([place])    Asymptomatic cholelithiasis 07/06/2019   Peripheral polyneuropathy 01/16/2019   Multiple pulmonary nodules 08/01/2016   Upper airway cough syndrome 07/30/2016   Chronic asthma vs UACS  07/11/2016   Mild cognitive impairment 01/22/2016    Essential tremor 10/17/2015   Chronic kidney disease, stage 3b ([place]) 07/02/2015   AKI (acute kidney injury) ([place]) 06/30/2015   Acute kidney injury ([place]) 06/30/2015   B12 deficiency 02/07/2014   Diverticulosis of colon with hemorrhage 01/01/2014   Thrombocytopenia, unspecified ([place]) 01/01/2014   Acute blood loss anemia 01/01/2014   Leukopenia 12/31/2013   Lower GI bleed 12/30/2013   Diverticulitis 12/30/2013   Abdominal pain, left lower quadrant 11/16/2013   Abdominal pain, unspecified site 11/16/2013   Mantle cell lymphoma ([place]) 09/07/2013   Prostate cancer ([place]) 09/07/2013   Proctitis, 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   H/O ulcerative colitis 04/12/2013   OSA (obstructive sleep apnea) 08/15/2012   VASOMOTOR RHINITIS 01/23/2010   GERD 01/23/2010   SOB (shortness of breath) 01/01/2010   Mixed hyperlipidemia 11/23/2009   Hypertension 11/23/2009   Coronary atherosclerosis of native coronary artery 02/18/2009    Past Surgical History:  Procedure Laterality Date   BACK SURGERY     BALLOON DILATION N/A 08/27/2013   Procedure: BALLOON DILATION;  Surgeon: Rogene Houston, MD;  [facility];  Service: Endoscopy;  Laterality: N/A;   COLON SURGERY     COLONOSCOPY N/A 12/17/2013   Procedure: COLONOSCOPY;  Surgeon: Rogene Houston, MD;  [facility];  Service: Endoscopy;  Laterality: N/A;  940   COLONOSCOPY N/A 02/28/2014   Procedure: COLONOSCOPY;  Surgeon: Rogene Houston, MD;  [facility];  Service: Endoscopy;  Laterality: N/A;  730   COLONOSCOPY N/A 08/04/2015   Procedure: COLONOSCOPY;  Surgeon: Rogene Houston, MD;  [facility];  Service: Endoscopy;  Laterality: N/A;  200 - moved to 11/11 @ 2:10 - Ann to notify pt   COLONOSCOPY WITH ESOPHAGOGASTRODUODENOSCOPY (EGD) N/A 08/27/2013   Procedure: COLONOSCOPY WITH ESOPHAGOGASTRODUODENOSCOPY (EGD);  Surgeon: Rogene Houston, MD;  [facility];  Service: Endoscopy;  Laterality: N/A;  730   IR REMOVAL  TUN ACCESS W/ PORT W/O FL MOD SED  07/17/2018   KNEE ARTHROSCOPY Left    KNEE SURGERY Right    total knee   MALONEY DILATION N/A 08/27/2013   Procedure: MALONEY DILATION;  Surgeon: Rogene Houston, MD;  [facility];  Service: Endoscopy;  Laterality: N/A;   PORTACATH PLACEMENT Right 2014   PROSTATECTOMY     removal of port Right    SAVORY DILATION N/A 08/27/2013   Procedure: SAVORY DILATION;  Surgeon: Rogene Houston, MD;  [facility];  Service: Endoscopy;  Laterality: N/A;   SHOULDER SURGERY     TONSILLECTOMY     VASECTOMY      Allergies Bee venom, Adhesive [tape], Ciprofloxacin, Codeine, Iodine, Latex, Povidone-iodine, and Simvastatin  Family History  Problem Relation Age of Onset   Colon cancer Brother    Cancer Brother    Diabetes Father    Cancer Brother     Social History Social History   Tobacco Use   Smoking status: Never   Smokeless tobacco: Never  Vaping Use   Vaping Use: Never used  Substance Use Topics   Alcohol use: No   Drug use: No    Review of Systems  Constitutional: No fever/chills. Positive balance difficulty.  Eyes: No visual changes. ENT: No sore throat. Cardiovascular: Denies chest pain. Respiratory: Mild shortness of breath s/ cough.  Gastrointestinal: No abdominal pain.  No nausea, no vomiting.  No diarrhea.  No constipation. Genitourinary: Negative for dysuria. Musculoskeletal: Positive diffuse back pain.  Skin: Negative for rash. Neurological: Negative for headaches, focal weakness or numbness.  10-point ROS otherwise negative.  ____________________________________________   PHYSICAL EXAM:  VITAL SIGNS: ED Triage Vitals  Enc Vitals Group     BP 05/19/21 0847 (!) 149/99     Pulse Rate 05/19/21 0847 93     Resp 05/19/21 0847 20     Temp 05/19/21 0847 98.1 F (36.7 C)     Temp Source 05/19/21 0847 Oral     SpO2 05/19/21 0846 94 %     Weight 05/19/21 0848 220 lb (99.8 kg)     Height 05/19/21 0848 5' 11"   (1.803 m)    Constitutional: Alert and oriented. Well appearing and in no acute distress. Eyes: Conjunctivae are normal. Head: Atraumatic. Nose: No congestion/rhinnorhea. Mouth/Throat: Mucous membranes are moist.   Neck: No stridor.   Cardiovascular: Normal rate, regular rhythm. Good peripheral circulation. Grossly normal heart sounds.   Respiratory: Normal respiratory effort.  No retractions. Lungs CTAB. Gastrointestinal: Soft and nontender. No distention.  Musculoskeletal: No lower extremity tenderness nor edema. No gross deformities of extremities. No midline thoracic or lumbar spine tenderness.  Neurologic:  Normal speech and language. No gross focal neurologic deficits are appreciated.  Skin:  Skin is warm, dry and intact. No rash noted.   ____________________________________________   LABS (all labs ordered are listed, but only abnormal results are displayed)  Labs Reviewed  RESP PANEL BY RT-PCR (  FLU A&B, COVID) ARPGX2 - Abnormal; Notable for the following components:      Result Value   SARS Coronavirus 2 by RT PCR POSITIVE (*)    All other components within normal limits  COMPREHENSIVE METABOLIC PANEL - Abnormal; Notable for the following components:   Chloride 97 (*)    Glucose, Bld 367 (*)    Creatinine, Ser 1.47 (*)    Calcium 8.5 (*)    Total Protein 6.4 (*)    GFR, Estimated 47 (*)    All other components within normal limits  CBC WITH DIFFERENTIAL/PLATELET - Abnormal; Notable for the following components:   WBC 3.7 (*)    RDW 15.8 (*)    Platelets 76 (*)    All other components within normal limits  URINALYSIS, ROUTINE W REFLEX MICROSCOPIC - Abnormal; Notable for the following components:   Glucose, UA >=500 (*)    Hgb urine dipstick SMALL (*)    All other components within normal limits  TROPONIN I (HIGH SENSITIVITY) - Abnormal; Notable for the following components:   Troponin I (High Sensitivity) 22 (*)    All other components within normal limits   TROPONIN I (HIGH SENSITIVITY) - Abnormal; Notable for the following components:   Troponin I (High Sensitivity) 25 (*)    All other components within normal limits  URINE CULTURE  LIPASE, BLOOD   ____________________________________________  EKG   EKG Interpretation  Date/Time:  Saturday May 19 2021 08:49:54 EDT Ventricular Rate:  97 PR Interval:  150 QRS Duration: 85 QT Interval:  324 QTC Calculation: 412 R Axis:   -52 Text Interpretation: Sinus rhythm Abnormal R-wave progression, late transition Inferior infarct, old Confirmed by Nanda Quinton [phone]) on 05/19/2021 9:05:46 AM        ____________________________________________  RADIOLOGY  CT CHEST ABDOMEN PELVIS W CONTRAST  Result Date: 05/19/2021 CLINICAL DATA:  Acute abdominal pain, low back pain, shortness of breath, dizziness. History of colon and prostate cancer, status post prostatectomy. History of diabetes. EXAM: CT CHEST, ABDOMEN, AND PELVIS WITH CONTRAST TECHNIQUE: Multidetector CT imaging of the chest, abdomen and pelvis was performed following the standard protocol during bolus administration of intravenous contrast. CONTRAST:  69m OMNIPAQUE IOHEXOL 300 MG/ML  SOLN COMPARISON:  Chest CT angiogram dated 05/05/2021. CT abdomen and pelvis dated 05/05/2021. FINDINGS: CT CHEST FINDINGS Cardiovascular: No thoracic aortic aneurysm or evidence of aortic dissection. Aortic atherosclerosis noted. No pericardial effusion. Mediastinum/Nodes: No mass or enlarged lymph nodes are seen within the mediastinum or perihilar regions. Esophagus is unremarkable. Trachea is unremarkable. Lungs/Pleura: Mild scarring/atelectasis within the lower lobes bilaterally. No consolidation, pleural effusion or pneumothorax. Chronic bronchitic changes noted within the bilateral perihilar regions. Musculoskeletal: No acute findings. Mild degenerative spondylosis within the mid/lower thoracic spine. CT ABDOMEN PELVIS FINDINGS Hepatobiliary: Multiple  layering stones within the otherwise normal-appearing gallbladder. No pericholecystic inflammation. No focal liver abnormality is seen. No bile duct dilatation. Pancreas: Unremarkable. No pancreatic ductal dilatation or surrounding inflammatory changes. Spleen: Normal in size without focal abnormality. Adrenals/Urinary Tract: Adrenal glands appear normal. Multiple bilateral renal cysts. Mild bilateral hydronephrosis, but this appears improved compared to the previous study of 05/05/2021. No ureteral or bladder calculi. Mild bladder wall thickening, circumferential, stable to slightly improved compared to the earlier CT of 05/05/2021. Stomach/Bowel: No dilated large or small bowel loops. Fairly extensive diverticulosis of the descending and upper sigmoid colon but no focal inflammatory change to suggest acute diverticulitis. No evidence of bowel wall inflammation. Stomach is unremarkable, partially decompressed. Appendix appears normal.  Vascular/Lymphatic: Aortic atherosclerosis. No abdominal aortic aneurysm or evidence of dissection. No acute-appearing vascular abnormality. No enlarged lymph nodes are seen. Reproductive: Penile prosthesis in place, without obvious complicating feature. Associated reservoir within the LEFT lower pelvis. Presumed prostatectomy. Other: No free fluid or abscess collection. No free intraperitoneal air. Musculoskeletal: Degenerative spondylosis of the lumbar spine, at least moderate in degree. No acute or suspicious osseous abnormality. IMPRESSION: 1. No acute findings within the chest, abdomen or pelvis. 2. Chronic bronchitic changes. No evidence of pneumonia. No pleural effusion. 3. Mild bilateral hydronephrosis, but this appears improved compared to the previous study of 05/05/2021. No ureteral or bladder calculi. Mild bladder wall thickening, stable to slightly improved compared to the earlier CT of 05/05/2021. This could represent chronic cystitis or chronic bladder outlet  obstruction. No evidence of pyelonephritis. 4. Colonic diverticulosis without evidence of acute diverticulitis. No bowel obstruction or evidence of bowel wall inflammation. 5. Cholelithiasis without evidence of acute cholecystitis. 6. Degenerative spondylosis of the thoracolumbar spine, at least moderate in degree within the lumbar spine. No acute or suspicious osseous abnormality. 7. No thoracic or abdominal aortic aneurysm. Aortic Atherosclerosis (ICD10-I70.0). Electronically Signed   By: Franki Cabot M.D.   On: 05/19/2021 11:43   DG Chest Portable 1 View  Result Date: 05/19/2021 CLINICAL DATA:  Shortness of breath. EXAM: PORTABLE CHEST 1 VIEW COMPARISON:  May 05, 2021. FINDINGS: The heart size and mediastinal contours are within normal limits. Minimal bibasilar subsegmental atelectasis is noted. The visualized skeletal structures are unremarkable. IMPRESSION: Minimal bibasilar subsegmental atelectasis. Electronically Signed   By: Marijo Conception M.D.   On: 05/19/2021 09:34    ____________________________________________   PROCEDURES  Procedure(s) performed:   Procedures   ____________________________________________   INITIAL IMPRESSION / ASSESSMENT AND PLAN / ED COURSE  Pertinent labs & imaging results that were available during my care of the patient were reviewed by me and considered in my medical decision making (see chart for details).   Patient presents to the emergency department with diffuse back pain, balance difficulty, shortness of breath.  Has a mild cough.  Symptoms seem more subacute and has been present since last hospitalization.  He was treated for an acute asthma exacerbation and found to have a urinary tract infection during that admission.  He is not hypoxemic here today.  No wheezing.  CT imaging of the head performed during last hospitalization.  Patient was up, ambulatory in the hospital and so no further intervention was required for his balance issues.  No focal  deficits to strongly suspect stroke.  Plan for chest imaging along with labs, UA, reassess.  The patient's history and exam is very atypical for PE or dissection although these were considered.   01:00 AM  CT imaging resulting with no PNA or other acute change. Bilateral hydro noted but found to bbe improved compared to prior imaging. Patient has f/u scheduled with Urology as an outpatient. No UTI. This certainly could be contributing to the patient's back pain but also notable for COVID 19 positive on PCR. Labs are reassuring. Serial troponin negative. Patient with no absolute contraindication for Paxlovid but will need to be renally dosed. No hypoxemia here. Plan for discharge with strict ED return precautions discussed.   [name] was evaluated in Emergency Department on 05/19/2021 for the symptoms described in the history of present illness. He was evaluated in the context of the global COVID-19 pandemic, which necessitated consideration that the patient might be at risk for infection  with the SARS-CoV-2 virus that causes COVID-19. Institutional protocols and algorithms that pertain to the evaluation of patients at risk for COVID-19 are in a state of rapid change based on information released by regulatory bodies including the CDC and federal and state organizations. These policies and algorithms were followed during the patient's care in the ED.  ____________________________________________  FINAL CLINICAL IMPRESSION(S) / ED DIAGNOSES  Final diagnoses:  Abdominal pain  COVID-19     MEDICATIONS GIVEN DURING THIS VISIT:  Medications  nirmatrelvir/ritonavir EUA (renal dosing) (PAXLOVID) 2 tablet (2 tablets Oral Given 05/19/21 1245)  sodium chloride 0.9 % bolus 500 mL (0 mLs Intravenous Stopped 05/19/21 1033)  fentaNYL (SUBLIMAZE) injection 50 mcg (50 mcg Intravenous Given 05/19/21 1030)  iohexol (OMNIPAQUE) 300 MG/ML solution 80 mL (80 mLs Intravenous Contrast Given 05/19/21 1054)   albuterol (VENTOLIN HFA) 108 (90 Base) MCG/ACT inhaler 2 puff (2 puffs Inhalation Given 05/19/21 1218)  oxyCODONE-acetaminophen (PERCOCET/ROXICET) 5-325 MG per tablet 1 tablet (1 tablet Oral Given 05/19/21 1238)     Note:  This document was prepared using Dragon voice recognition software and may include unintentional dictation errors.  Nanda Quinton, MD, [facility] Emergency Medicine    Tayshun Gappa, [facility], MD 05/19/21 [phone]

## 2021-05-21 LAB — URINE CULTURE: Culture: NO GROWTH

## 2021-05-31 DIAGNOSIS — M79671 Pain in right foot: Secondary | ICD-10-CM | POA: Diagnosis not present

## 2021-05-31 DIAGNOSIS — M79674 Pain in right toe(s): Secondary | ICD-10-CM | POA: Diagnosis not present

## 2021-05-31 DIAGNOSIS — E1140 Type 2 diabetes mellitus with diabetic neuropathy, unspecified: Secondary | ICD-10-CM | POA: Diagnosis not present

## 2021-05-31 DIAGNOSIS — L600 Ingrowing nail: Secondary | ICD-10-CM | POA: Diagnosis not present

## 2021-05-31 DIAGNOSIS — L110 Acquired keratosis follicularis: Secondary | ICD-10-CM | POA: Diagnosis not present

## 2021-05-31 DIAGNOSIS — M79672 Pain in left foot: Secondary | ICD-10-CM | POA: Diagnosis not present

## 2021-05-31 DIAGNOSIS — M79675 Pain in left toe(s): Secondary | ICD-10-CM | POA: Diagnosis not present

## 2021-06-04 ENCOUNTER — Other Ambulatory Visit: Payer: Self-pay

## 2021-06-04 ENCOUNTER — Inpatient Hospital Stay (HOSPITAL_COMMUNITY)
Admission: EM | Admit: 2021-06-04 | Discharge: 2021-06-12 | DRG: 673 | Disposition: A | Discharge status: Home / Self Care | Payer: Medicare Other | Attending: Internal Medicine | Admitting: Internal Medicine

## 2021-06-04 ENCOUNTER — Observation Stay (HOSPITAL_COMMUNITY): Payer: Medicare Other

## 2021-06-04 ENCOUNTER — Emergency Department (HOSPITAL_COMMUNITY): Payer: Medicare Other

## 2021-06-04 ENCOUNTER — Encounter (HOSPITAL_COMMUNITY): Payer: Self-pay | Admitting: Family Medicine

## 2021-06-04 DIAGNOSIS — Z833 Family history of diabetes mellitus: Secondary | ICD-10-CM

## 2021-06-04 DIAGNOSIS — N35819 Other urethral stricture, male, unspecified site: Secondary | ICD-10-CM | POA: Diagnosis present

## 2021-06-04 DIAGNOSIS — Z781 Physical restraint status: Secondary | ICD-10-CM

## 2021-06-04 DIAGNOSIS — G9341 Metabolic encephalopathy: Secondary | ICD-10-CM | POA: Diagnosis not present

## 2021-06-04 DIAGNOSIS — C8310 Mantle cell lymphoma, unspecified site: Secondary | ICD-10-CM | POA: Diagnosis present

## 2021-06-04 DIAGNOSIS — N4889 Other specified disorders of penis: Secondary | ICD-10-CM | POA: Diagnosis not present

## 2021-06-04 DIAGNOSIS — Z8546 Personal history of malignant neoplasm of prostate: Secondary | ICD-10-CM

## 2021-06-04 DIAGNOSIS — J9601 Acute respiratory failure with hypoxia: Secondary | ICD-10-CM

## 2021-06-04 DIAGNOSIS — D6959 Other secondary thrombocytopenia: Secondary | ICD-10-CM | POA: Diagnosis not present

## 2021-06-04 DIAGNOSIS — Z960 Presence of urogenital implants: Secondary | ICD-10-CM | POA: Diagnosis not present

## 2021-06-04 DIAGNOSIS — N179 Acute kidney failure, unspecified: Secondary | ICD-10-CM

## 2021-06-04 DIAGNOSIS — E162 Hypoglycemia, unspecified: Secondary | ICD-10-CM | POA: Diagnosis not present

## 2021-06-04 DIAGNOSIS — Z9689 Presence of other specified functional implants: Secondary | ICD-10-CM | POA: Diagnosis present

## 2021-06-04 DIAGNOSIS — T83111A Breakdown (mechanical) of urinary sphincter implant, initial encounter: Secondary | ICD-10-CM | POA: Diagnosis present

## 2021-06-04 DIAGNOSIS — G3184 Mild cognitive impairment, so stated: Secondary | ICD-10-CM | POA: Diagnosis not present

## 2021-06-04 DIAGNOSIS — T83591A Infection and inflammatory reaction due to implanted urinary sphincter, initial encounter: Secondary | ICD-10-CM | POA: Diagnosis not present

## 2021-06-04 DIAGNOSIS — Z79890 Hormone replacement therapy (postmenopausal): Secondary | ICD-10-CM

## 2021-06-04 DIAGNOSIS — T85898A Other specified complication of other internal prosthetic devices, implants and grafts, initial encounter: Secondary | ICD-10-CM | POA: Diagnosis not present

## 2021-06-04 DIAGNOSIS — E782 Mixed hyperlipidemia: Secondary | ICD-10-CM | POA: Diagnosis not present

## 2021-06-04 DIAGNOSIS — N1832 Chronic kidney disease, stage 3b: Secondary | ICD-10-CM | POA: Diagnosis not present

## 2021-06-04 DIAGNOSIS — G4733 Obstructive sleep apnea (adult) (pediatric): Secondary | ICD-10-CM | POA: Diagnosis not present

## 2021-06-04 DIAGNOSIS — J9811 Atelectasis: Secondary | ICD-10-CM | POA: Diagnosis not present

## 2021-06-04 DIAGNOSIS — Z96651 Presence of right artificial knee joint: Secondary | ICD-10-CM | POA: Diagnosis present

## 2021-06-04 DIAGNOSIS — Z881 Allergy status to other antibiotic agents status: Secondary | ICD-10-CM

## 2021-06-04 DIAGNOSIS — I10 Essential (primary) hypertension: Secondary | ICD-10-CM | POA: Diagnosis present

## 2021-06-04 DIAGNOSIS — I129 Hypertensive chronic kidney disease with stage 1 through stage 4 chronic kidney disease, or unspecified chronic kidney disease: Secondary | ICD-10-CM | POA: Diagnosis not present

## 2021-06-04 DIAGNOSIS — D696 Thrombocytopenia, unspecified: Secondary | ICD-10-CM | POA: Diagnosis present

## 2021-06-04 DIAGNOSIS — U071 COVID-19: Secondary | ICD-10-CM | POA: Diagnosis not present

## 2021-06-04 DIAGNOSIS — A4151 Sepsis due to Escherichia coli [E. coli]: Secondary | ICD-10-CM | POA: Diagnosis present

## 2021-06-04 DIAGNOSIS — E11649 Type 2 diabetes mellitus with hypoglycemia without coma: Secondary | ICD-10-CM | POA: Diagnosis not present

## 2021-06-04 DIAGNOSIS — Z743 Need for continuous supervision: Secondary | ICD-10-CM | POA: Diagnosis not present

## 2021-06-04 DIAGNOSIS — Z9079 Acquired absence of other genital organ(s): Secondary | ICD-10-CM

## 2021-06-04 DIAGNOSIS — Z1611 Resistance to penicillins: Secondary | ICD-10-CM | POA: Diagnosis present

## 2021-06-04 DIAGNOSIS — R6520 Severe sepsis without septic shock: Secondary | ICD-10-CM | POA: Diagnosis present

## 2021-06-04 DIAGNOSIS — Z923 Personal history of irradiation: Secondary | ICD-10-CM

## 2021-06-04 DIAGNOSIS — Z79899 Other long term (current) drug therapy: Secondary | ICD-10-CM

## 2021-06-04 DIAGNOSIS — Z20822 Contact with and (suspected) exposure to covid-19: Secondary | ICD-10-CM | POA: Diagnosis not present

## 2021-06-04 DIAGNOSIS — R6889 Other general symptoms and signs: Secondary | ICD-10-CM | POA: Diagnosis not present

## 2021-06-04 DIAGNOSIS — R32 Unspecified urinary incontinence: Secondary | ICD-10-CM | POA: Diagnosis not present

## 2021-06-04 DIAGNOSIS — R338 Other retention of urine: Secondary | ICD-10-CM | POA: Diagnosis not present

## 2021-06-04 DIAGNOSIS — R42 Dizziness and giddiness: Secondary | ICD-10-CM | POA: Diagnosis not present

## 2021-06-04 DIAGNOSIS — T83719A Erosion of other prosthetic materials to surrounding organ or tissue, initial encounter: Secondary | ICD-10-CM | POA: Diagnosis not present

## 2021-06-04 DIAGNOSIS — E119 Type 2 diabetes mellitus without complications: Secondary | ICD-10-CM

## 2021-06-04 DIAGNOSIS — Z885 Allergy status to narcotic agent status: Secondary | ICD-10-CM

## 2021-06-04 DIAGNOSIS — E1122 Type 2 diabetes mellitus with diabetic chronic kidney disease: Secondary | ICD-10-CM | POA: Diagnosis not present

## 2021-06-04 DIAGNOSIS — Z794 Long term (current) use of insulin: Secondary | ICD-10-CM

## 2021-06-04 DIAGNOSIS — T83191A Other mechanical complication of urinary sphincter implant, initial encounter: Secondary | ICD-10-CM | POA: Diagnosis not present

## 2021-06-04 DIAGNOSIS — R404 Transient alteration of awareness: Secondary | ICD-10-CM | POA: Diagnosis not present

## 2021-06-04 DIAGNOSIS — S3021XA Contusion of penis, initial encounter: Secondary | ICD-10-CM | POA: Diagnosis not present

## 2021-06-04 DIAGNOSIS — E1165 Type 2 diabetes mellitus with hyperglycemia: Secondary | ICD-10-CM | POA: Diagnosis not present

## 2021-06-04 DIAGNOSIS — J9690 Respiratory failure, unspecified, unspecified whether with hypoxia or hypercapnia: Secondary | ICD-10-CM | POA: Diagnosis not present

## 2021-06-04 DIAGNOSIS — N390 Urinary tract infection, site not specified: Secondary | ICD-10-CM | POA: Diagnosis present

## 2021-06-04 DIAGNOSIS — Z8616 Personal history of COVID-19: Secondary | ICD-10-CM | POA: Diagnosis not present

## 2021-06-04 DIAGNOSIS — E1151 Type 2 diabetes mellitus with diabetic peripheral angiopathy without gangrene: Secondary | ICD-10-CM | POA: Diagnosis present

## 2021-06-04 DIAGNOSIS — Z7951 Long term (current) use of inhaled steroids: Secondary | ICD-10-CM

## 2021-06-04 DIAGNOSIS — K219 Gastro-esophageal reflux disease without esophagitis: Secondary | ICD-10-CM | POA: Diagnosis not present

## 2021-06-04 DIAGNOSIS — Z85038 Personal history of other malignant neoplasm of large intestine: Secondary | ICD-10-CM

## 2021-06-04 DIAGNOSIS — C61 Malignant neoplasm of prostate: Secondary | ICD-10-CM | POA: Diagnosis present

## 2021-06-04 DIAGNOSIS — Z7984 Long term (current) use of oral hypoglycemic drugs: Secondary | ICD-10-CM

## 2021-06-04 DIAGNOSIS — I2510 Atherosclerotic heart disease of native coronary artery without angina pectoris: Secondary | ICD-10-CM | POA: Diagnosis present

## 2021-06-04 DIAGNOSIS — R390 Extravasation of urine: Secondary | ICD-10-CM | POA: Diagnosis not present

## 2021-06-04 DIAGNOSIS — E161 Other hypoglycemia: Secondary | ICD-10-CM | POA: Diagnosis not present

## 2021-06-04 DIAGNOSIS — E538 Deficiency of other specified B group vitamins: Secondary | ICD-10-CM | POA: Diagnosis present

## 2021-06-04 DIAGNOSIS — Z800 Family history of malignant neoplasm of digestive organs: Secondary | ICD-10-CM

## 2021-06-04 LAB — CBC WITH DIFFERENTIAL/PLATELET
Abs Immature Granulocytes: 0.06 10*3/uL (ref 0.00–0.07)
Basophils Absolute: 0 10*3/uL (ref 0.0–0.1)
Basophils Relative: 0 %
Eosinophils Absolute: 0 10*3/uL (ref 0.0–0.5)
Eosinophils Relative: 0 %
HCT: 48.1 % (ref 39.0–52.0)
Hemoglobin: 15.7 g/dL (ref 13.0–17.0)
Immature Granulocytes: 1 %
Lymphocytes Relative: 8 %
Lymphs Abs: 0.5 10*3/uL — ABNORMAL LOW (ref 0.7–4.0)
MCH: 30 pg (ref 26.0–34.0)
MCHC: 32.6 g/dL (ref 30.0–36.0)
MCV: 92 fL (ref 80.0–100.0)
Monocytes Absolute: 0.5 10*3/uL (ref 0.1–1.0)
Monocytes Relative: 8 %
Neutro Abs: 5 10*3/uL (ref 1.7–7.7)
Neutrophils Relative %: 83 %
Platelets: 64 10*3/uL — ABNORMAL LOW (ref 150–400)
RBC: 5.23 MIL/uL (ref 4.22–5.81)
RDW: 16.4 % — ABNORMAL HIGH (ref 11.5–15.5)
WBC: 6 10*3/uL (ref 4.0–10.5)
nRBC: 0 % (ref 0.0–0.2)

## 2021-06-04 LAB — COMPREHENSIVE METABOLIC PANEL
ALT: 19 U/L (ref 0–44)
AST: 21 U/L (ref 15–41)
Albumin: 3.9 g/dL (ref 3.5–5.0)
Alkaline Phosphatase: 55 U/L (ref 38–126)
Anion gap: 10 (ref 5–15)
BUN: 46 mg/dL — ABNORMAL HIGH (ref 8–23)
CO2: 19 mmol/L — ABNORMAL LOW (ref 22–32)
Calcium: 8 mg/dL — ABNORMAL LOW (ref 8.9–10.3)
Chloride: 105 mmol/L (ref 98–111)
Creatinine, Ser: 2.07 mg/dL — ABNORMAL HIGH (ref 0.61–1.24)
GFR, Estimated: 31 mL/min — ABNORMAL LOW (ref >60)
Glucose, Bld: 275 mg/dL — ABNORMAL HIGH (ref 70–99)
Potassium: 4.4 mmol/L (ref 3.5–5.1)
Sodium: 134 mmol/L — ABNORMAL LOW (ref 135–145)
Total Bilirubin: 2.3 mg/dL — ABNORMAL HIGH (ref 0.3–1.2)
Total Protein: 6.8 g/dL (ref 6.5–8.1)

## 2021-06-04 LAB — URINALYSIS, ROUTINE W REFLEX MICROSCOPIC
Bilirubin Urine: NEGATIVE
Glucose, UA: NEGATIVE mg/dL
Ketones, ur: 5 mg/dL — AB
Nitrite: NEGATIVE
Protein, ur: >=300 mg/dL — AB
Specific Gravity, Urine: 1.011 (ref 1.005–1.030)
WBC, UA: >50 WBC/hpf — ABNORMAL HIGH (ref 0–5)
pH: 5 (ref 5.0–8.0)

## 2021-06-04 LAB — HEMOGLOBIN A1C
Hgb A1c MFr Bld: 9.2 % — ABNORMAL HIGH (ref 4.8–5.6)
Mean Plasma Glucose: 217.34 mg/dL

## 2021-06-04 LAB — TROPONIN I (HIGH SENSITIVITY)
Troponin I (High Sensitivity): 62 ng/L — ABNORMAL HIGH (ref <18)
Troponin I (High Sensitivity): 82 ng/L — ABNORMAL HIGH (ref <18)

## 2021-06-04 LAB — LACTIC ACID, PLASMA
Lactic Acid, Venous: 1.3 mmol/L (ref 0.5–1.9)
Lactic Acid, Venous: 1.4 mmol/L (ref 0.5–1.9)
Lactic Acid, Venous: 1.5 mmol/L (ref 0.5–1.9)
Lactic Acid, Venous: 1.5 mmol/L (ref 0.5–1.9)

## 2021-06-04 LAB — BRAIN NATRIURETIC PEPTIDE: B Natriuretic Peptide: 250 pg/mL — ABNORMAL HIGH (ref 0.0–100.0)

## 2021-06-04 LAB — APTT: aPTT: 33 seconds (ref 24–36)

## 2021-06-04 LAB — RESP PANEL BY RT-PCR (FLU A&B, COVID) ARPGX2
Influenza A by PCR: NEGATIVE
Influenza B by PCR: NEGATIVE
SARS Coronavirus 2 by RT PCR: POSITIVE — AB

## 2021-06-04 LAB — TSH: TSH: 3.553 u[IU]/mL (ref 0.350–4.500)

## 2021-06-04 LAB — PROTIME-INR
INR: 1.1 (ref 0.8–1.2)
Prothrombin Time: 14.5 seconds (ref 11.4–15.2)

## 2021-06-04 LAB — CBG MONITORING, ED: Glucose-Capillary: 225 mg/dL — ABNORMAL HIGH (ref 70–99)

## 2021-06-04 MED ORDER — METOPROLOL TARTRATE 5 MG/5ML IV SOLN
5.0000 mg | Freq: Once | INTRAVENOUS | Status: AC
  Administered 2021-06-04: 5 mg via INTRAVENOUS

## 2021-06-04 MED ORDER — LACTATED RINGERS IV BOLUS (SEPSIS)
1000.0000 mL | Freq: Once | INTRAVENOUS | Status: AC
  Administered 2021-06-04: 1000 mL via INTRAVENOUS

## 2021-06-04 MED ORDER — ENOXAPARIN SODIUM 40 MG/0.4ML IJ SOSY
40.0000 mg | PREFILLED_SYRINGE | INTRAMUSCULAR | Status: DC
  Administered 2021-06-04: 40 mg via SUBCUTANEOUS
  Filled 2021-06-04: qty 0.4

## 2021-06-04 MED ORDER — INSULIN ASPART 100 UNIT/ML IJ SOLN: 0.0000 [IU] | Freq: Three times a day (TID) | INTRAMUSCULAR | Status: DC

## 2021-06-04 MED ORDER — METOPROLOL TARTRATE 5 MG/5ML IV SOLN
5.0000 mg | Freq: Four times a day (QID) | INTRAVENOUS | Status: DC | PRN
  Administered 2021-06-04: 5 mg via INTRAVENOUS
  Filled 2021-06-04 (×2): qty 5

## 2021-06-04 MED ORDER — AMLODIPINE BESYLATE 5 MG PO TABS: 5.0000 mg | ORAL_TABLET | Freq: Every day | ORAL | Status: DC

## 2021-06-04 MED ORDER — DOCUSATE SODIUM 100 MG PO CAPS
200.0000 mg | ORAL_CAPSULE | Freq: Every day | ORAL | Status: DC | PRN
  Administered 2021-06-11: 200 mg via ORAL
  Filled 2021-06-04: qty 2

## 2021-06-04 MED ORDER — MOMETASONE FURO-FORMOTEROL FUM 200-5 MCG/ACT IN AERO
2.0000 | INHALATION_SPRAY | Freq: Two times a day (BID) | RESPIRATORY_TRACT | Status: DC
  Administered 2021-06-06 – 2021-06-11 (×10): 2 via RESPIRATORY_TRACT
  Filled 2021-06-04 (×2): qty 8.8

## 2021-06-04 MED ORDER — IPRATROPIUM BROMIDE 0.06 % NA SOLN
2.0000 | Freq: Two times a day (BID) | NASAL | Status: DC
  Administered 2021-06-07 – 2021-06-10 (×5): 2 via NASAL
  Filled 2021-06-04 (×2): qty 15

## 2021-06-04 MED ORDER — INSULIN GLARGINE-YFGN 100 UNIT/ML ~~LOC~~ SOLN
70.0000 [IU] | SUBCUTANEOUS | Status: DC
  Administered 2021-06-05: 70 [IU] via SUBCUTANEOUS
  Filled 2021-06-04 (×2): qty 0.7

## 2021-06-04 MED ORDER — GABAPENTIN 300 MG PO CAPS: 300.0000 mg | ORAL_CAPSULE | Freq: Every day | ORAL | Status: DC

## 2021-06-04 MED ORDER — PANTOPRAZOLE SODIUM 40 MG PO TBEC: 40.0000 mg | DELAYED_RELEASE_TABLET | Freq: Two times a day (BID) | ORAL | Status: DC

## 2021-06-04 MED ORDER — POLYETHYLENE GLYCOL 3350 17 G PO PACK: 17.0000 g | PACK | Freq: Every day | ORAL | Status: DC | PRN

## 2021-06-04 MED ORDER — INSULIN GLARGINE-YFGN 100 UNIT/ML ~~LOC~~ SOLN
70.0000 [IU] | SUBCUTANEOUS | Status: DC
  Filled 2021-06-04: qty 0.7

## 2021-06-04 MED ORDER — ASPIRIN EC 81 MG PO TBEC: 81.0000 mg | DELAYED_RELEASE_TABLET | Freq: Every day | ORAL | Status: DC

## 2021-06-04 MED ORDER — INSULIN LISPRO 100 UNIT/ML CARTRIDGE: 1.0000 [IU] | SUBCUTANEOUS | Status: DC

## 2021-06-04 MED ORDER — VANCOMYCIN HCL 1750 MG/350ML IV SOLN: 1750.0000 mg | INTRAVENOUS | Status: DC

## 2021-06-04 MED ORDER — LORAZEPAM 0.5 MG PO TABS
0.5000 mg | ORAL_TABLET | ORAL | Status: DC | PRN
  Filled 2021-06-04: qty 1

## 2021-06-04 MED ORDER — ACETAMINOPHEN 325 MG PO TABS
650.0000 mg | ORAL_TABLET | Freq: Four times a day (QID) | ORAL | Status: DC | PRN
  Administered 2021-06-04: 650 mg via ORAL
  Filled 2021-06-04: qty 2

## 2021-06-04 MED ORDER — LORAZEPAM 2 MG/ML IJ SOLN
1.0000 mg | Freq: Four times a day (QID) | INTRAMUSCULAR | Status: DC | PRN
  Administered 2021-06-04 – 2021-06-11 (×6): 1 mg via INTRAVENOUS
  Filled 2021-06-04 (×7): qty 1

## 2021-06-04 MED ORDER — METHYLPREDNISOLONE SODIUM SUCC 125 MG IJ SOLR
125.0000 mg | Freq: Two times a day (BID) | INTRAMUSCULAR | Status: DC
  Administered 2021-06-04 – 2021-06-05 (×2): 125 mg via INTRAVENOUS
  Filled 2021-06-04 (×2): qty 2

## 2021-06-04 MED ORDER — ATORVASTATIN CALCIUM 10 MG PO TABS: 20.0000 mg | ORAL_TABLET | Freq: Every day | ORAL | Status: DC

## 2021-06-04 MED ORDER — GABAPENTIN 300 MG PO CAPS: 300.0000 mg | ORAL_CAPSULE | Freq: Two times a day (BID) | ORAL | Status: DC

## 2021-06-04 MED ORDER — METRONIDAZOLE 500 MG/100ML IV SOLN
500.0000 mg | Freq: Once | INTRAVENOUS | Status: AC
  Administered 2021-06-04: 500 mg via INTRAVENOUS
  Filled 2021-06-04: qty 100

## 2021-06-04 MED ORDER — LACTATED RINGERS IV BOLUS
3075.0000 mL | Freq: Once | INTRAVENOUS | Status: AC
  Administered 2021-06-04: 3075 mL via INTRAVENOUS

## 2021-06-04 MED ORDER — GLIPIZIDE 5 MG PO TABS: 10.0000 mg | ORAL_TABLET | Freq: Two times a day (BID) | ORAL | Status: DC

## 2021-06-04 MED ORDER — VANCOMYCIN HCL 2000 MG/400ML IV SOLN
2000.0000 mg | Freq: Once | INTRAVENOUS | Status: AC
  Administered 2021-06-04: 2000 mg via INTRAVENOUS
  Filled 2021-06-04: qty 400

## 2021-06-04 MED ORDER — LACTATED RINGERS IV BOLUS (SEPSIS)
500.0000 mL | Freq: Once | INTRAVENOUS | Status: AC
  Administered 2021-06-04: 500 mL via INTRAVENOUS

## 2021-06-04 MED ORDER — SODIUM CHLORIDE 0.9 % IV SOLN
2.0000 g | Freq: Once | INTRAVENOUS | Status: AC
  Administered 2021-06-04: 2 g via INTRAVENOUS
  Filled 2021-06-04: qty 2

## 2021-06-04 MED ORDER — VANCOMYCIN HCL IN DEXTROSE 1-5 GM/200ML-% IV SOLN: 1000.0000 mg | Freq: Once | INTRAVENOUS | Status: DC

## 2021-06-04 MED ORDER — PROPRANOLOL HCL ER 120 MG PO CP24
120.0000 mg | ORAL_CAPSULE | Freq: Every day | ORAL | Status: DC
  Filled 2021-06-04 (×2): qty 1

## 2021-06-04 MED ORDER — TEMAZEPAM 15 MG PO CAPS: 15.0000 mg | ORAL_CAPSULE | Freq: Every evening | ORAL | Status: DC | PRN

## 2021-06-04 MED ORDER — BARICITINIB 2 MG PO TABS: 4.0000 mg | ORAL_TABLET | Freq: Every day | ORAL | Status: DC

## 2021-06-04 MED ORDER — FLUTICASONE PROPIONATE 50 MCG/ACT NA SUSP
1.0000 | NASAL | Status: DC | PRN
  Administered 2021-06-08: 1 via NASAL
  Filled 2021-06-04: qty 16

## 2021-06-04 MED ORDER — ALBUTEROL SULFATE HFA 108 (90 BASE) MCG/ACT IN AERS
2.0000 | INHALATION_SPRAY | Freq: Every evening | RESPIRATORY_TRACT | Status: DC | PRN
  Administered 2021-06-08: 2 via RESPIRATORY_TRACT
  Filled 2021-06-04: qty 6.7

## 2021-06-04 MED ORDER — SODIUM CHLORIDE 0.9 % IV SOLN: 2.0000 g | INTRAVENOUS | Status: DC

## 2021-06-04 MED ORDER — ACETAMINOPHEN 650 MG RE SUPP
650.0000 mg | Freq: Four times a day (QID) | RECTAL | Status: DC | PRN
  Filled 2021-06-04: qty 1

## 2021-06-04 MED ORDER — LACTATED RINGERS IV SOLN: INTRAVENOUS | Status: AC

## 2021-06-04 MED ORDER — MORPHINE SULFATE (PF) 2 MG/ML IV SOLN
2.0000 mg | INTRAVENOUS | Status: DC | PRN
Start: 2021-06-04 — End: 2021-06-05
  Administered 2021-06-04 (×2): 2 mg via INTRAVENOUS
  Filled 2021-06-04 (×2): qty 1

## 2021-06-04 MED ORDER — GABAPENTIN 300 MG PO CAPS: 600.0000 mg | ORAL_CAPSULE | Freq: Every day | ORAL | Status: DC

## 2021-06-04 MED ORDER — SODIUM CHLORIDE 0.9 % IV SOLN
2.0000 g | Freq: Once | INTRAVENOUS | Status: AC
  Administered 2021-06-04: 2 g via INTRAVENOUS
  Filled 2021-06-04: qty 20

## 2021-06-04 MED ORDER — M.V.I. ADULT IV INJ
INJECTION | Freq: Once | INTRAVENOUS | Status: AC
  Filled 2021-06-04: qty 1000

## 2021-06-04 MED ORDER — ALBUTEROL (5 MG/ML) CONTINUOUS INHALATION SOLN
INHALATION_SOLUTION | RESPIRATORY_TRACT | Status: AC
  Filled 2021-06-04: qty 20

## 2021-06-04 NOTE — ED Triage Notes (Signed)
Per EMS wife went to the basement to check out patient and thought he had low blood sugar. EMS states cbg 262. Patient was on the toilet for 1.5 hours per him. Patient states weakness and recently dx with covid approximately 1.5 weeks ago. Warm to the touch per EMS.

## 2021-06-04 NOTE — ED Notes (Addendum)
Date and time results received: 06/04/21 10:23 PM   Test: Trop Critical Value: 66  Name of Provider Notified: Dr. Kennon Rounds  Orders Received? Or Actions Taken?: Orders received

## 2021-06-04 NOTE — ED Notes (Signed)
Pt tachypneic with sats down to 78, Oxygen applied at 2lnc and titrated up to nrb 15l to achieve sats of 96%, RT notified and MD notified.

## 2021-06-04 NOTE — ED Notes (Signed)
Dr. Kennon Rounds to bedside @ [phone], this nurse provided SBAR communication regarding ongoing hypertension, desaturation with oxygen admin, pt with rigor like behavior, and refusing po meds earlier. Orders received and implemented. Vital signs improved. Pt currently on bipap resting comfortably, continues to be tachypneic.

## 2021-06-04 NOTE — ED Notes (Signed)
Patient attempting to get out of bed. Patient adjusted himself to the edge of the bed. Attempted to help patient back in bed with verbal coaching and patient could not follow directions. Patient assisted back into and pulled up in the bed with 3 nursing staff. Patient educated on fall risk and the importance of staying in the bed.

## 2021-06-04 NOTE — ED Notes (Signed)
Patient confused and trying to get out of bed with MD at bedside. Patient incontinent of urine and bed linen changed. Patient states he need to use bathroom and bed pan palced under patient without bowel movement. Patient needs constant redirection to remain in the bed and patient does not verbalize his needs when asked.

## 2021-06-04 NOTE — Progress Notes (Signed)
Asked by nursing staff to return to see patient.  Since admission he had become increasingly agitated we have given him 1 mg of Ativan IV. He then developed significant change in status.  He became acutely hypoxic requiring nonrebreather was tachypneic and had a change in mental status.  He was unable to follow commands.  He seemed to be in rigors. Respiratory put him on BiPAP and give him a continuous infusion.  Assessment Acute change in status unclear etiology Hypoxic respiratory failure in the setting of known COVID as well as possible urosepsis.  Plan Will give Solu-Medrol Continuous nebulizer Check chest x-ray Check EKG Check troponins Check BNP Check acute head CT If status worsens consider transfer for ICU care.  I called and updated the wife about this change in status.  Donnamae Jude, MD 06/04/2021 8:25 PM

## 2021-06-04 NOTE — ED Notes (Signed)
Patient continues to try and get out of bed despite sitter interaction. Dr. Kennon Rounds messaged for medication orders at this time.

## 2021-06-04 NOTE — Sepsis Progress Note (Signed)
ELink monitoring sepsis protocol

## 2021-06-04 NOTE — Progress Notes (Addendum)
Pharmacy Antibiotic Note  [name] is a 86 y.o. male admitted on 06/04/2021 with  unknown source .  Pharmacy has been consulted for Vancomycin and cefepime dosing.  Plan: Vancomycin 2044m IV loading dose, then 17535mIV q48h for AUC of 490  Cefepime 2gm IV q24h F/U cxs and clinical progress Monitor V/S, labs and levels as indicated  Height: 5' 11"  (180.3 cm) Weight: 100 kg (220 lb 7.4 oz) IBW/kg (Calculated) : 75.3  Temp (24hrs), Avg:100.6 F (38.1 C), Min:100.6 F (38.1 C), Max:100.6 F (38.1 C)  Recent Labs  Lab 06/04/21 1229 06/04/21 1239  CREATININE  --  2.07*  LATICACIDVEN 1.3  --     Normalized CrCl 27108mmin  Estimated Creatinine Clearance: 32.6 mL/min (A) (by C-G formula based on SCr of 2.07 mg/dL (H)).    Allergies  Allergen Reactions   Bee Venom Anaphylaxis   Adhesive [Tape] Hives   Ciprofloxacin     Unknown    Codeine     Unknown     Iodine     Unknown     Latex     Unknown     Povidone-Iodine     Unknown     Simvastatin     Unknown      Antimicrobials this admission: Vancomycin  9/12 >>  Cefepime 9/12 >>   Microbiology results: 9/12 BCx: pending 9/12 UCx: pending   MRSA PCR:   Thank you for allowing pharmacy to be a part of this patient's care. [name] [name] Pager #[phone]/2022 1:43 PM

## 2021-06-04 NOTE — ED Notes (Addendum)
Patient edge of bed trying to get out. Patient assisted back into bed with 3 person assist. Ac called for Air cabin crew.

## 2021-06-04 NOTE — H&P (Signed)
History and Physical    [name] OZH:[id number] DOB: 1935-08-01 DOA: 06/04/2021  PCP: Celene Squibb, MD  Patient coming from: Home  I have personally briefly reviewed patient's old medical records in [place]  Chief Complaint: Confusion  HPI: [name] is a 86 y.o. male with medical history significant of CAD, HLD, DM, HTN, CKD, B12 deficiency, mantle cell lymphoma of the colon, GERD, PVD, prostate CA, OSA not on CPAP who was seen in the ED on 827 with a positive COVID test.  Per the wife he took medication and felt some better from his COVID.  This morning when he was home he was found to be mildly hypoglycemic per his wife seemed mildly confused.  She gave him juice and eggs and he seemed to recover.  She then try to get him up and walking around and she noted he was weak and confused.  She then called EMS. ED Course: In the ED the patient was noted to have low-grade fever at 100.6, hypertension and acute on chronic kidney disease.  He had some mild confusion but a negative CT and negative chest x-ray.  Urinalysis was suggestive of a UTI.  Sepsis protocol was begun in the ED and he was given cefepime and vancomycin as well as fluid resuscitation.  Hospitalist were asked to admit at this point.  Review of Systems: As per HPI otherwise 10 point review of systems negative.   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, anomalous circumflex - no PCIs   Essential hypertension, benign    GERD (gastroesophageal reflux disease)    Mixed hyperlipidemia    Obstructive sleep apnea    does not use   Prostate cancer ([place])    PVD (peripheral vascular disease) ([place])    Type 2 diabetes mellitus ([place])     Past Surgical History:  Procedure Laterality Date   BACK SURGERY     BALLOON DILATION N/A 08/27/2013   Procedure: BALLOON  DILATION;  Surgeon: Rogene Houston, MD;  [facility];  Service: Endoscopy;  Laterality: N/A;   COLON SURGERY     COLONOSCOPY N/A 12/17/2013   Procedure: COLONOSCOPY;  Surgeon: Rogene Houston, MD;  [facility];  Service: Endoscopy;  Laterality: N/A;  940   COLONOSCOPY N/A 02/28/2014   Procedure: COLONOSCOPY;  Surgeon: Rogene Houston, MD;  [facility];  Service: Endoscopy;  Laterality: N/A;  730   COLONOSCOPY N/A 08/04/2015   Procedure: COLONOSCOPY;  Surgeon: Rogene Houston, MD;  [facility];  Service: Endoscopy;  Laterality: N/A;  200 - moved to 11/11 @ 2:10 - Ann to notify pt   COLONOSCOPY WITH ESOPHAGOGASTRODUODENOSCOPY (EGD) N/A 08/27/2013   Procedure: COLONOSCOPY WITH ESOPHAGOGASTRODUODENOSCOPY (EGD);  Surgeon: Rogene Houston, MD;  [facility];  Service: Endoscopy;  Laterality: N/A;  730   IR REMOVAL TUN ACCESS W/ PORT W/O FL MOD SED  07/17/2018   KNEE ARTHROSCOPY Left    KNEE SURGERY Right    total knee   MALONEY DILATION N/A 08/27/2013   Procedure: MALONEY DILATION;  Surgeon: Rogene Houston, MD;  [facility];  Service: Endoscopy;  Laterality: N/A;   PORTACATH PLACEMENT Right 2014   PROSTATECTOMY     removal of port Right    SAVORY  DILATION N/A 08/27/2013   Procedure: SAVORY DILATION;  Surgeon: Rogene Houston, MD;  [facility];  Service: Endoscopy;  Laterality: N/A;   SHOULDER SURGERY     TONSILLECTOMY     VASECTOMY       reports that he has never smoked. He has never used smokeless tobacco. He reports that he does not drink alcohol and does not use drugs.  Allergies  Allergen Reactions   Bee Venom Anaphylaxis   Adhesive [Tape] Hives   Ciprofloxacin     Unknown    Codeine     Unknown     Iodine     Unknown     Latex     Unknown     Povidone-Iodine     Unknown     Simvastatin     Unknown      Family History  Problem Relation Age of Onset   Colon cancer Brother    Cancer  Brother    Diabetes Father    Cancer Brother      Prior to Admission medications   Medication Sig Start Date End Date Taking? Authorizing Provider  Accu-Chek FastClix Lancets MISC Apply topically. 07/06/20   [provider]  ACCU-CHEK GUIDE test strip  07/06/20   [provider]  acetaminophen (TYLENOL) 325 MG tablet Take 650 mg by mouth every 4 (four) hours as needed for mild pain or moderate pain. Take 2 tablets 1 hour prior to Rituxan treatment.    [provider]  albuterol (PROAIR HFA) 108 (90 Base) MCG/ACT inhaler Inhale 2 puffs into the lungs at bedtime as needed for wheezing or shortness of breath. 05/12/20   Tanda Rockers, MD  amLODipine (NORVASC) 5 MG tablet Take 1 tablet (5 mg total) by mouth daily. 09/12/20   Burtis Junes, NP  aspirin EC 81 MG tablet Take 1 tablet (81 mg total) by mouth daily. Swallow whole. 05/06/21 06/05/21  Donne Hazel, MD  atorvastatin (LIPITOR) 20 MG tablet Take 20 mg by mouth daily.    [provider]  budesonide-formoterol (SYMBICORT) 160-4.5 MCG/ACT inhaler Inhale 2 puffs into the lungs 2 (two) times daily. 05/12/20   Tanda Rockers, MD  docusate sodium (COLACE) 100 MG capsule Take 200 mg by mouth daily as needed for mild constipation.    [provider]  fluticasone (FLONASE) 50 MCG/ACT nasal spray Place 1 spray into both nostrils as needed for allergies or rhinitis.    [provider]  gabapentin (NEURONTIN) 300 MG capsule Take 1 cap in AM and 2 caps at night (300AM, 600PM) 02/15/21   Frann Rider, NP  glipiZIDE (GLUCOTROL) 10 MG tablet Take 10 mg by mouth 2 (two) times daily before a meal.  01/09/16   [provider]  hydrocortisone 2.5 % cream Apply 1 application topically as needed (itching).    [provider]  insulin lispro (HUMALOG) 100 UNIT/ML cartridge Inject 1-10 Units into the skin as directed. On sliding scale when eating a big meal    [provider]   ipratropium (ATROVENT) 0.06 % nasal spray Place 2 sprays into both nostrils 2 (two) times daily.  04/15/17   [provider]  meclizine (ANTIVERT) 25 MG tablet Take 25 mg by mouth 2 (two) times daily as needed for dizziness.  05/01/17   [provider]  pantoprazole (PROTONIX) 40 MG tablet TAKE 1 TABLET BY MOUTH 2 TIMES DAILY BEFORE A MEAL. 05/08/20 06/30/21  Harvel Quale, MD  propranolol  ER (INDERAL LA) 120 MG 24 hr capsule Take 1 capsule (120 mg total) by mouth at bedtime. For tremor. 02/15/21   Frann Rider, NP  temazepam (RESTORIL) 15 MG capsule Take 15 mg by mouth at bedtime as needed for sleep. 06/26/20   [provider]  testosterone cypionate (DEPOTESTOSTERONE CYPIONATE) 200 MG/ML injection Inject 200 mg into the muscle every 14 (fourteen) days.  04/29/16   [provider]  TOUJEO SOLOSTAR 300 UNIT/ML SOPN Inject 70 Units into the skin every morning.  03/23/16   [provider]  insulin aspart (NOVOLOG) 100 UNIT/ML injection Inject 20 Units into the skin 3 (three) times daily before meals.    12/13/11  [provider]  rosuvastatin (CRESTOR) 40 MG tablet Take 40 mg by mouth daily.    01/13/19  [provider]    Physical Exam: Vitals:   06/04/21 1400 06/04/21 1430 06/04/21 1500 06/04/21 1530  BP: (!) 157/85 (!) 151/82 (!) 170/86 (!) 148/94  Pulse: 93 91 91 98  Resp: (!) 31 (!) 21 (!) 27 (!) 27  Temp:      TempSrc:      SpO2: 95% 94% 96% 96%  Weight:      Height:        Constitutional: NAD, somewhat agitated, comfortable Eyes: PERRL, lids and conjunctivae normal ENMT: Mucous membranes are moist. Posterior pharynx clear of any exudate or lesions.Normal dentition.  Neck: normal, supple, no masses, no thyromegaly Respiratory: clear to auscultation bilaterally, no wheezing, no crackles. Normal respiratory effort. No accessory muscle use.  Cardiovascular: Regular rate and rhythm, no murmurs / rubs / gallops. No  extremity edema. 2+ pedal pulses. No carotid bruits.  Abdomen: no tenderness, no masses palpated. No hepatosplenomegaly. Bowel sounds positive.  Musculoskeletal: no clubbing / cyanosis. No joint deformity upper and lower extremities. Good ROM, no contractures. Normal muscle tone.  Skin: no rashes, lesions, ulcers. No induration Neurologic: Grossly normal.  Patient is not oriented to time or place. Psychiatric: Normal judgment and insight. Alert and oriented x 3. Normal mood.   Labs on Admission: I have personally reviewed following labs and imaging studies  CBC: Recent Labs  Lab 06/04/21 1239  WBC 6.0  NEUTROABS 5.0  HGB 15.7  HCT 48.1  MCV 92.0  PLT 64*   Basic Metabolic Panel: Recent Labs  Lab 06/04/21 1239  NA 134*  K 4.4  CL 105  CO2 19*  GLUCOSE 275*  BUN 46*  CREATININE 2.07*  CALCIUM 8.0*   GFR: Estimated Creatinine Clearance: 32.6 mL/min (A) (by C-G formula based on SCr of 2.07 mg/dL (H)). Liver Function Tests: Recent Labs  Lab 06/04/21 1239  AST 21  ALT 19  ALKPHOS 55  BILITOT 2.3*  PROT 6.8  ALBUMIN 3.9   Coagulation Profile: Recent Labs  Lab 06/04/21 1239  INR 1.1    Urine analysis:    Component Value Date/Time   COLORURINE YELLOW 06/04/2021 1400   APPEARANCEUR CLOUDY (A) 06/04/2021 1400   LABSPEC 1.011 06/04/2021 1400   PHURINE 5.0 06/04/2021 1400   GLUCOSEU NEGATIVE 06/04/2021 1400   HGBUR MODERATE (A) 06/04/2021 1400   BILIRUBINUR NEGATIVE 06/04/2021 1400   BILIRUBINUR negative 05/04/2021 1702   KETONESUR 5 (A) 06/04/2021 1400   PROTEINUR >=300 (A) 06/04/2021 1400   UROBILINOGEN 1.0 05/04/2021 1702   UROBILINOGEN 0.2 06/30/2015 1552   NITRITE NEGATIVE 06/04/2021 1400   LEUKOCYTESUR LARGE (A) 06/04/2021 1400    Radiological Exams on Admission: CT HEAD WO CONTRAST (5MM)  Result  Date: 06/04/2021 CLINICAL DATA:  Dizziness and weakness today. Recent COVID-19 infection. Cerebral hemorrhage suspected. EXAM: CT HEAD WITHOUT CONTRAST  TECHNIQUE: Contiguous axial images were obtained from the base of the skull through the vertex without intravenous contrast. COMPARISON:  CT head 05/05/2021 and 02/17/2018. FINDINGS: Brain: There is no evidence of acute intracranial hemorrhage, mass lesion, brain edema or extra-axial fluid collection. Atrophy with prominence of the ventricles and subarachnoid spaces. There is stable mild low-density in the periventricular white matter, most consistent with chronic small vessel ischemic change. There is no CT evidence of acute cortical infarction. Vascular: Mild intracranial atherosclerosis. No hyperdense vessel identified. Skull: Negative for fracture or focal lesion. Sinuses/Orbits: New mucosal thickening and partial opacification of the bilateral ethmoid, right frontal and right maxillary sinuses without air-fluid levels. The mastoid air cells and middle ears are clear. Stable postsurgical changes in both orbits. Other: None. IMPRESSION: 1. Stable appearance of the brain without acute intracranial findings. 2. New paranasal sinus mucosal thickening and partial opacification consistent with sinusitis. Electronically Signed   By: Richardean Sale M.D.   On: 06/04/2021 14:40   DG Chest Port 1 View  Result Date: 06/04/2021 CLINICAL DATA:  Questionable sepsis EXAM: PORTABLE CHEST 1 VIEW COMPARISON:  05/19/2021 FINDINGS: The heart size and mediastinal contours are within normal limits. Mild bibasilar atelectasis is again noted. No pleural effusion or pneumothorax. The visualized skeletal structures are unremarkable. IMPRESSION: Mild bibasilar atelectasis. Electronically Signed   By: Merilyn Baba M.D.   On: 06/04/2021 13:17    EKG: Independently reviewed.  Sinus rhythm, old inferior infarct, poor R wave progression.  Assessment/Plan Principal Problem:   Acute kidney injury ([place]) Active Problems:   Mixed hyperlipidemia   Hypertension   Coronary atherosclerosis of native coronary artery   GERD   Mantle cell  lymphoma ([facility])   Prostate cancer ([facility])   Thrombocytopenia, unspecified ([facility])   B12 deficiency   Chronic kidney disease, stage 3b ([facility])   Mild cognitive impairment   Type 2 diabetes mellitus ([place])   COVID-19 virus infection  Acute on chronic kidney disease Baseline creatinine 1.47 BUN is elevated at 46 suspect dehydration Gentle rehydration Avoid nephrotoxic agents  COVID-19 Has completed course of Paxlovid Has ongoing cough No oxygen requirement at present  Pyuria Suspect UTI, check culture, may be contributing to confusion Received cefepime and vancomycin as part of a possible sepsis work-up in the ED. Continue Rocephin   Type 2 diabetes Hyperglycemic right now Continue home meds of insulin, glipizide Consider addition of linagliptin Sliding scale insulin  Hypertension Continue Norvasc  B12 deficiency On monthly B12 injections  Mantle cell lymphoma Has not seen hematology since 2019 No obvious evidence of recurrence however his platelet count is low but this could be from [place] Patient had negative CT of the abdomen and pelvis with contrast on 05/19/2021  Coronary artery disease Continue atorvastatin Continue aspirin  Hyperlipidemia Continue atorvastatin  History of prostate cancer  Thrombocytopenia Platelets have been mildly low in the past around 120-130 and most recently at 112 COVID testing them drop they were 76 at diagnosis and are today 64.  No evidence of ongoing bleeding  Mild cognitive impairment Possibly worsened due to COVID as well as hypoglycemia this morning seems to be recovering well.   DVT prophylaxis: Lovenox SQ Code Status: Full code  Family Communication: Wife by telephone Disposition Plan: Home Consults called: None Admission status: Observation Patient is placed in observation due to weakness IV fluid hydration and IV antibiotics.  Donnamae Jude MD [facility]  If 7PM-7AM, please contact night-coverage 06/04/2021,  5:09 PM

## 2021-06-04 NOTE — ED Notes (Signed)
Pt is trying to still get out of bed. Two techs at bedside so he can not jump out of the bed.

## 2021-06-04 NOTE — ED Provider Notes (Signed)
[facility] EMERGENCY DEPARTMENT Provider Note   CSN: [id number] Arrival date & time: 06/04/21  1105     History Chief Complaint  Patient presents with   Altered Mental Status    [name] is a 86 y.o. male.  Patient with weakness and mild confusion.  He was diagnosed with COVID couple weeks ago  The history is provided by the patient and medical records. A language interpreter was used.  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, anomalous circumflex - no PCIs   Essential hypertension, benign    GERD (gastroesophageal reflux disease)    Mixed hyperlipidemia    Obstructive sleep apnea    does not use   Prostate cancer ([place])    PVD (peripheral vascular disease) ([place])    Type 2 diabetes mellitus ([place])     Patient Active Problem List   Diagnosis Date Noted   Imbalance 05/06/2021   Hydronephrosis 05/06/2021   Acute respiratory failure with hypoxia ([place]) 05/06/2021   Acute asthma exacerbation 05/06/2021   Type 2 diabetes mellitus ([place])    Asymptomatic cholelithiasis 07/06/2019   Peripheral polyneuropathy 01/16/2019   Multiple pulmonary nodules 08/01/2016   Upper airway cough syndrome 07/30/2016   Chronic asthma vs UACS  07/11/2016   Mild cognitive impairment 01/22/2016   Essential tremor 10/17/2015   Chronic kidney disease, stage 3b ([place]) 07/02/2015   AKI (acute kidney injury) ([place]) 06/30/2015   Acute kidney injury ([place]) 06/30/2015   B12 deficiency 02/07/2014    Diverticulosis of colon with hemorrhage 01/01/2014   Thrombocytopenia, unspecified ([place]) 01/01/2014   Acute blood loss anemia 01/01/2014   Leukopenia 12/31/2013   Lower GI bleed 12/30/2013   Diverticulitis 12/30/2013   Abdominal pain, left lower quadrant 11/16/2013   Abdominal pain, unspecified site 11/16/2013   Mantle cell lymphoma ([place]) 09/07/2013   Prostate cancer ([place]) 09/07/2013   Proctitis, 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   H/O ulcerative colitis 04/12/2013   OSA (obstructive sleep apnea) 08/15/2012   VASOMOTOR RHINITIS 01/23/2010   GERD 01/23/2010   SOB (shortness of breath) 01/01/2010   Mixed hyperlipidemia 11/23/2009   Hypertension 11/23/2009   Coronary atherosclerosis of native coronary artery 02/18/2009    Past Surgical History:  Procedure Laterality Date   BACK SURGERY     BALLOON DILATION N/A 08/27/2013   Procedure: BALLOON DILATION;  Surgeon: Rogene Houston, MD;  Location: [facility] ENDO SUITE;  Service: Endoscopy;  Laterality: N/A;   COLON SURGERY     COLONOSCOPY N/A 12/17/2013   Procedure: COLONOSCOPY;  Surgeon: Rogene Houston, MD;  Location: [facility] END[facility];  Service: Endoscopy;  Laterality: N/A;  940   COLONOSCOPY N/A 02/28/2014   Procedure: COLONOSCOPY;  Surgeon: Rogene Houston, MD;  Location: [facility];  Service: Endoscopy;  Laterality: N/A;  730   COLONOSCOPY N/A 08/04/2015   Procedure: COLONOSCOPY;  Surgeon: Rogene Houston, MD;  Location: [facility];  Service: Endoscopy;  Laterality: N/A;  200 - moved to 11/11 @ 2:10 -  Ann to notify pt   COLONOSCOPY WITH ESOPHAGOGASTRODUODENOSCOPY (EGD) N/A 08/27/2013   Procedure: COLONOSCOPY WITH ESOPHAGOGASTRODUODENOSCOPY (EGD);  Surgeon: Rogene Houston, MD;  Location[facility];  Service: Endoscopy;  Laterality: N/A;  730   IR REMOVAL TUN ACCESS W/ PORT W/O FL MOD SED  07/17/2018   KNEE ARTHROSCOPY Left    KNEE SURGERY Right    total knee   MALONEY DILATION N/A 08/27/2013   Procedure: MALONEY DILATION;  Surgeon: Rogene Houston,  MD;  Location[facility];  Service: Endoscopy;  Laterality: N/A;   PORTACATH PLACEMENT Right 2014   PROSTATECTOMY     removal of port Right    SAVORY DILATION N/A 08/27/2013   Procedure: SAVORY DILATION;  Surgeon: Rogene Houston, MD;  Location: [facility];  Service: Endoscopy;  Laterality: N/A;   SHOULDER SURGERY     TONSILLECTOMY     VASECTOMY         Family History  Problem Relation Age of Onset   Colon cancer Brother    Cancer Brother    Diabetes Father    Cancer Brother     Social History   Tobacco Use   Smoking status: Never   Smokeless tobacco: Never  Vaping Use   Vaping Use: Never used  Substance Use Topics   Alcohol use: No   Drug use: No    Home Medications Prior to Admission medications   Medication Sig Start Date End Date Taking? Authorizing Provider  Accu-Chek FastClix Lancets MISC Apply topically. 07/06/20   [provider]  ACCU-CHEK GUIDE test strip  07/06/20   [provider]  acetaminophen (TYLENOL) 325 MG tablet Take 650 mg by mouth every 4 (four) hours as needed for mild pain or moderate pain. Take 2 tablets 1 hour prior to Rituxan treatment.    [provider]  albuterol (PROAIR HFA) 108 (90 Base) MCG/ACT inhaler Inhale 2 puffs into the lungs at bedtime as needed for wheezing or shortness of breath. 05/12/20   Tanda Rockers, MD  amLODipine (NORVASC) 5 MG tablet Take 1 tablet (5 mg total) by mouth daily. 09/12/20   Burtis Junes, NP  aspirin EC 81 MG tablet Take 1 tablet (81 mg total) by mouth daily. Swallow whole. 05/06/21 06/05/21  Donne Hazel, MD  atorvastatin (LIPITOR) 20 MG tablet Take 20 mg by mouth daily.    [provider]  budesonide-formoterol (SYMBICORT) 160-4.5 MCG/ACT inhaler Inhale 2 puffs into the lungs 2 (two) times daily. 05/12/20   Tanda Rockers, MD  docusate sodium (COLACE) 100 MG capsule Take 200 mg by mouth daily as needed for mild constipation.    [provider]   fluticasone (FLONASE) 50 MCG/ACT nasal spray Place 1 spray into both nostrils as needed for allergies or rhinitis.    [provider]  gabapentin (NEURONTIN) 300 MG capsule Take 1 cap in AM and 2 caps at night (300AM, 600PM) 02/15/21   Frann Rider, NP  glipiZIDE (GLUCOTROL) 10 MG tablet Take 10 mg by mouth 2 (two) times daily before a meal.  01/09/16   [provider]  hydrocortisone 2.5 % cream Apply 1 application topically as needed (itching).    [provider]  insulin lispro (HUMALOG) 100 UNIT/ML cartridge Inject 1-10 Units into the skin as directed. On sliding scale when eating a big meal    [provider]  ipratropium (ATROVENT) 0.06 % nasal spray Place 2 sprays into both nostrils 2 (two) times  daily.  04/15/17   [provider]  meclizine (ANTIVERT) 25 MG tablet Take 25 mg by mouth 2 (two) times daily as needed for dizziness.  05/01/17   [provider]  pantoprazole (PROTONIX) 40 MG tablet TAKE 1 TABLET BY MOUTH 2 TIMES DAILY BEFORE A MEAL. 05/08/20 06/30/21  Harvel Quale, MD  propranolol ER (INDERAL LA) 120 MG 24 hr capsule Take 1 capsule (120 mg total) by mouth at bedtime. For tremor. 02/15/21   Frann Rider, NP  temazepam (RESTORIL) 15 MG capsule Take 15 mg by mouth at bedtime as needed for sleep. 06/26/20   [provider]  testosterone cypionate (DEPOTESTOSTERONE CYPIONATE) 200 MG/ML injection Inject 200 mg into the muscle every 14 (fourteen) days.  04/29/16   [provider]  TOUJEO SOLOSTAR 300 UNIT/ML SOPN Inject 70 Units into the skin every morning.  03/23/16   [provider]  insulin aspart (NOVOLOG) 100 UNIT/ML injection Inject 20 Units into the skin 3 (three) times daily before meals.    12/13/11  [provider]  rosuvastatin (CRESTOR) 40 MG tablet Take 40 mg by mouth daily.    01/13/19  [provider]    Allergies    Bee venom, Adhesive [tape], Ciprofloxacin, Codeine,  Iodine, Latex, Povidone-iodine, and Simvastatin  Review of Systems   Review of Systems  Constitutional:  Negative for appetite change and fatigue.  HENT:  Negative for congestion, ear discharge and sinus pressure.   Eyes:  Negative for discharge.  Respiratory:  Negative for cough.   Cardiovascular:  Negative for chest pain.  Gastrointestinal:  Negative for abdominal pain and diarrhea.  Genitourinary:  Negative for frequency and hematuria.  Musculoskeletal:  Negative for back pain.  Skin:  Negative for rash.  Neurological:  Negative for seizures and headaches.  Psychiatric/Behavioral:  Negative for hallucinations.    Physical Exam Updated Vital Signs BP (!) 170/86   Pulse 91   Temp (!) 100.6 F (38.1 C) (Oral)   Resp (!) 27   Ht 5' 11"  (1.803 m)   Wt 100 kg   SpO2 96%   BMI 30.75 kg/m   Physical Exam Vitals and nursing note reviewed.  Constitutional:      Appearance: He is well-developed.  HENT:     Head: Normocephalic.     Nose: Nose normal.  Eyes:     General: No scleral icterus.    Conjunctiva/sclera: Conjunctivae normal.  Neck:     Thyroid: No thyromegaly.  Cardiovascular:     Rate and Rhythm: Normal rate and regular rhythm.     Heart sounds: No murmur heard.   No friction rub. No gallop.  Pulmonary:     Breath sounds: No stridor. No wheezing or rales.  Chest:     Chest wall: No tenderness.  Abdominal:     General: There is no distension.     Tenderness: There is no abdominal tenderness. There is no rebound.  Musculoskeletal:        General: Normal range of motion.     Cervical back: Neck supple.  Lymphadenopathy:     Cervical: No cervical adenopathy.  Skin:    Findings: No erythema or rash.  Neurological:     Mental Status: He is alert and oriented to person, place, and time.     Motor: No abnormal muscle tone.     Coordination: Coordination normal.  Psychiatric:        Behavior: Behavior normal.    ED Results / Procedures /  Treatments    Labs (all labs ordered are listed, but only abnormal results are displayed) Labs Reviewed  RESP PANEL BY RT-PCR (FLU A&B, COVID) ARPGX2 - Abnormal; Notable for the following components:      Result Value   SARS Coronavirus 2 by RT PCR POSITIVE (*)    All other components within normal limits  COMPREHENSIVE METABOLIC PANEL - Abnormal; Notable for the following components:   Sodium 134 (*)    CO2 19 (*)    Glucose, Bld 275 (*)    BUN 46 (*)    Creatinine, Ser 2.07 (*)    Calcium 8.0 (*)    Total Bilirubin 2.3 (*)    GFR, Estimated 31 (*)    All other components within normal limits  CBC WITH DIFFERENTIAL/PLATELET - Abnormal; Notable for the following components:   RDW 16.4 (*)    Platelets 64 (*)    Lymphs Abs 0.5 (*)    All other components within normal limits  URINALYSIS, ROUTINE W REFLEX MICROSCOPIC - Abnormal; Notable for the following components:   APPearance CLOUDY (*)    Hgb urine dipstick MODERATE (*)    Ketones, ur 5 (*)    Protein, ur >=300 (*)    Leukocytes,Ua LARGE (*)    WBC, UA >50 (*)    Bacteria, UA MANY (*)    All other components within normal limits  CULTURE, BLOOD (ROUTINE X 2)  CULTURE, BLOOD (ROUTINE X 2)  URINE CULTURE  LACTIC ACID, PLASMA  LACTIC ACID, PLASMA  PROTIME-INR  APTT    EKG None  Radiology CT HEAD WO CONTRAST (5MM)  Result Date: 06/04/2021 CLINICAL DATA:  Dizziness and weakness today. Recent COVID-19 infection. Cerebral hemorrhage suspected. EXAM: CT HEAD WITHOUT CONTRAST TECHNIQUE: Contiguous axial images were obtained from the base of the skull through the vertex without intravenous contrast. COMPARISON:  CT head 05/05/2021 and 02/17/2018. FINDINGS: Brain: There is no evidence of acute intracranial hemorrhage, mass lesion, brain edema or extra-axial fluid collection. Atrophy with prominence of the ventricles and subarachnoid spaces. There is stable mild low-density in the periventricular white matter, most consistent with chronic  small vessel ischemic change. There is no CT evidence of acute cortical infarction. Vascular: Mild intracranial atherosclerosis. No hyperdense vessel identified. Skull: Negative for fracture or focal lesion. Sinuses/Orbits: New mucosal thickening and partial opacification of the bilateral ethmoid, right frontal and right maxillary sinuses without air-fluid levels. The mastoid air cells and middle ears are clear. Stable postsurgical changes in both orbits. Other: None. IMPRESSION: 1. Stable appearance of the brain without acute intracranial findings. 2. New paranasal sinus mucosal thickening and partial opacification consistent with sinusitis. Electronically Signed   By: Richardean Sale M.D.   On: 06/04/2021 14:40   DG Chest Port 1 View  Result Date: 06/04/2021 CLINICAL DATA:  Questionable sepsis EXAM: PORTABLE CHEST 1 VIEW COMPARISON:  05/19/2021 FINDINGS: The heart size and mediastinal contours are within normal limits. Mild bibasilar atelectasis is again noted. No pleural effusion or pneumothorax. The visualized skeletal structures are unremarkable. IMPRESSION: Mild bibasilar atelectasis. Electronically Signed   By: Merilyn Baba M.D.   On: 06/04/2021 13:17    Procedures Procedures   Medications Ordered in ED Medications  lactated ringers infusion (has no administration in time range)  lactated ringers bolus 1,000 mL (0 mLs Intravenous Stopped 06/04/21 1430)    And  lactated ringers bolus 1,000 mL (0 mLs Intravenous Stopped 06/04/21 1509)    And  lactated ringers bolus 1,000 mL (has no  administration in time range)    And  lactated ringers bolus 500 mL (has no administration in time range)  vancomycin (VANCOREADY) IVPB 2000 mg/400 mL (2,000 mg Intravenous New Bag/Given 06/04/21 1518)  vancomycin (VANCOREADY) IVPB 1750 mg/350 mL (has no administration in time range)  ceFEPIme (MAXIPIME) 2 g in sodium chloride 0.9 % 100 mL IVPB (has no administration in time range)  ceFEPIme (MAXIPIME) 2 g in  sodium chloride 0.9 % 100 mL IVPB (0 g Intravenous Stopped 06/04/21 1341)  metroNIDAZOLE (FLAGYL) IVPB 500 mg (0 mg Intravenous Stopped 06/04/21 1516)    ED Course  I have reviewed the triage vital signs and the nursing notes.  Pertinent labs & imaging results that were available during my care of the patient were reviewed by me and considered in my medical decision making (see chart for details).    MDM Rules/Calculators/A&P                           Patient with urinary tract infection and COVID and dehydration he will be admitted to medicine Final Clinical Impression(s) / ED Diagnoses Final diagnoses:  AKI (acute kidney injury) ([place])  COVID-19    Rx / [place] Orders ED Discharge Orders     None        Milton Ferguson, MD 06/07/21 1011

## 2021-06-04 NOTE — ED Notes (Signed)
Despite redirection from sitter and security, patient continues to get out of bed. Patient does not follow commands and continues to try to get out of bed.

## 2021-06-04 NOTE — ED Notes (Signed)
Patient attempted to get out of bed. Wife has left the bedside and patient educated on the importance of staying in bed and fall risk. Patient remains confused.

## 2021-06-05 ENCOUNTER — Encounter (HOSPITAL_COMMUNITY): Payer: Self-pay | Admitting: Internal Medicine

## 2021-06-05 DIAGNOSIS — I10 Essential (primary) hypertension: Secondary | ICD-10-CM | POA: Diagnosis not present

## 2021-06-05 DIAGNOSIS — N179 Acute kidney failure, unspecified: Secondary | ICD-10-CM | POA: Diagnosis not present

## 2021-06-05 DIAGNOSIS — N4889 Other specified disorders of penis: Secondary | ICD-10-CM | POA: Diagnosis not present

## 2021-06-05 DIAGNOSIS — Z1611 Resistance to penicillins: Secondary | ICD-10-CM | POA: Diagnosis present

## 2021-06-05 DIAGNOSIS — C8310 Mantle cell lymphoma, unspecified site: Secondary | ICD-10-CM | POA: Diagnosis present

## 2021-06-05 DIAGNOSIS — E1122 Type 2 diabetes mellitus with diabetic chronic kidney disease: Secondary | ICD-10-CM | POA: Diagnosis present

## 2021-06-05 DIAGNOSIS — G4733 Obstructive sleep apnea (adult) (pediatric): Secondary | ICD-10-CM | POA: Diagnosis not present

## 2021-06-05 DIAGNOSIS — T83591A Infection and inflammatory reaction due to implanted urinary sphincter, initial encounter: Secondary | ICD-10-CM | POA: Diagnosis present

## 2021-06-05 DIAGNOSIS — J9601 Acute respiratory failure with hypoxia: Secondary | ICD-10-CM | POA: Diagnosis not present

## 2021-06-05 DIAGNOSIS — K219 Gastro-esophageal reflux disease without esophagitis: Secondary | ICD-10-CM | POA: Diagnosis not present

## 2021-06-05 DIAGNOSIS — T83191A Other mechanical complication of urinary sphincter implant, initial encounter: Secondary | ICD-10-CM | POA: Diagnosis not present

## 2021-06-05 DIAGNOSIS — N35819 Other urethral stricture, male, unspecified site: Secondary | ICD-10-CM | POA: Diagnosis present

## 2021-06-05 DIAGNOSIS — U071 COVID-19: Secondary | ICD-10-CM | POA: Diagnosis not present

## 2021-06-05 DIAGNOSIS — E782 Mixed hyperlipidemia: Secondary | ICD-10-CM | POA: Diagnosis not present

## 2021-06-05 DIAGNOSIS — I129 Hypertensive chronic kidney disease with stage 1 through stage 4 chronic kidney disease, or unspecified chronic kidney disease: Secondary | ICD-10-CM | POA: Diagnosis present

## 2021-06-05 DIAGNOSIS — Z960 Presence of urogenital implants: Secondary | ICD-10-CM | POA: Diagnosis present

## 2021-06-05 DIAGNOSIS — Z20822 Contact with and (suspected) exposure to covid-19: Secondary | ICD-10-CM | POA: Diagnosis present

## 2021-06-05 DIAGNOSIS — E1151 Type 2 diabetes mellitus with diabetic peripheral angiopathy without gangrene: Secondary | ICD-10-CM | POA: Diagnosis present

## 2021-06-05 DIAGNOSIS — G3184 Mild cognitive impairment, so stated: Secondary | ICD-10-CM | POA: Diagnosis present

## 2021-06-05 DIAGNOSIS — G9341 Metabolic encephalopathy: Secondary | ICD-10-CM | POA: Diagnosis present

## 2021-06-05 DIAGNOSIS — E11649 Type 2 diabetes mellitus with hypoglycemia without coma: Secondary | ICD-10-CM | POA: Diagnosis not present

## 2021-06-05 DIAGNOSIS — A4151 Sepsis due to Escherichia coli [E. coli]: Secondary | ICD-10-CM | POA: Diagnosis present

## 2021-06-05 DIAGNOSIS — N390 Urinary tract infection, site not specified: Secondary | ICD-10-CM | POA: Diagnosis present

## 2021-06-05 DIAGNOSIS — E1165 Type 2 diabetes mellitus with hyperglycemia: Secondary | ICD-10-CM | POA: Diagnosis present

## 2021-06-05 DIAGNOSIS — Z8616 Personal history of COVID-19: Secondary | ICD-10-CM | POA: Diagnosis not present

## 2021-06-05 DIAGNOSIS — D6959 Other secondary thrombocytopenia: Secondary | ICD-10-CM | POA: Diagnosis present

## 2021-06-05 DIAGNOSIS — R6520 Severe sepsis without septic shock: Secondary | ICD-10-CM | POA: Diagnosis present

## 2021-06-05 DIAGNOSIS — N1832 Chronic kidney disease, stage 3b: Secondary | ICD-10-CM | POA: Diagnosis present

## 2021-06-05 DIAGNOSIS — T83111A Breakdown (mechanical) of urinary sphincter implant, initial encounter: Secondary | ICD-10-CM | POA: Diagnosis present

## 2021-06-05 LAB — MRSA NEXT GEN BY PCR, NASAL: MRSA by PCR Next Gen: NOT DETECTED

## 2021-06-05 LAB — BLOOD CULTURE ID PANEL (REFLEXED) - BCID2

## 2021-06-05 LAB — COMPREHENSIVE METABOLIC PANEL
ALT: 17 U/L (ref 0–44)
AST: 23 U/L (ref 15–41)
Albumin: 2.8 g/dL — ABNORMAL LOW (ref 3.5–5.0)
Alkaline Phosphatase: 43 U/L (ref 38–126)
Anion gap: 9 (ref 5–15)
BUN: 48 mg/dL — ABNORMAL HIGH (ref 8–23)
CO2: 23 mmol/L (ref 22–32)
Calcium: 7.5 mg/dL — ABNORMAL LOW (ref 8.9–10.3)
Chloride: 107 mmol/L (ref 98–111)
Creatinine, Ser: 2.21 mg/dL — ABNORMAL HIGH (ref 0.61–1.24)
GFR, Estimated: 29 mL/min — ABNORMAL LOW (ref >60)
Glucose, Bld: 306 mg/dL — ABNORMAL HIGH (ref 70–99)
Potassium: 4.4 mmol/L (ref 3.5–5.1)
Sodium: 139 mmol/L (ref 135–145)
Total Bilirubin: 1.1 mg/dL (ref 0.3–1.2)
Total Protein: 5.3 g/dL — ABNORMAL LOW (ref 6.5–8.1)

## 2021-06-05 LAB — CBC WITH DIFFERENTIAL/PLATELET
Abs Immature Granulocytes: 0.1 10*3/uL — ABNORMAL HIGH (ref 0.00–0.07)
Basophils Absolute: 0 10*3/uL (ref 0.0–0.1)
Basophils Relative: 0 %
Eosinophils Absolute: 0 10*3/uL (ref 0.0–0.5)
Eosinophils Relative: 0 %
HCT: 43.8 % (ref 39.0–52.0)
Hemoglobin: 13.8 g/dL (ref 13.0–17.0)
Immature Granulocytes: 1 %
Lymphocytes Relative: 6 %
Lymphs Abs: 0.5 10*3/uL — ABNORMAL LOW (ref 0.7–4.0)
MCH: 29.7 pg (ref 26.0–34.0)
MCHC: 31.5 g/dL (ref 30.0–36.0)
MCV: 94.2 fL (ref 80.0–100.0)
Monocytes Absolute: 0.2 10*3/uL (ref 0.1–1.0)
Monocytes Relative: 3 %
Neutro Abs: 6.5 10*3/uL (ref 1.7–7.7)
Neutrophils Relative %: 90 %
Platelets: 55 10*3/uL — ABNORMAL LOW (ref 150–400)
RBC: 4.65 MIL/uL (ref 4.22–5.81)
RDW: 16.7 % — ABNORMAL HIGH (ref 11.5–15.5)
WBC: 7.3 10*3/uL (ref 4.0–10.5)
nRBC: 0 % (ref 0.0–0.2)

## 2021-06-05 LAB — TROPONIN I (HIGH SENSITIVITY)
Troponin I (High Sensitivity): 71 ng/L — ABNORMAL HIGH (ref <18)
Troponin I (High Sensitivity): 59 ng/L — ABNORMAL HIGH (ref <18)

## 2021-06-05 LAB — CBG MONITORING, ED
Glucose-Capillary: 247 mg/dL — ABNORMAL HIGH (ref 70–99)
Glucose-Capillary: 353 mg/dL — ABNORMAL HIGH (ref 70–99)

## 2021-06-05 LAB — GLUCOSE, CAPILLARY
Glucose-Capillary: 181 mg/dL — ABNORMAL HIGH (ref 70–99)
Glucose-Capillary: 211 mg/dL — ABNORMAL HIGH (ref 70–99)
Glucose-Capillary: 288 mg/dL — ABNORMAL HIGH (ref 70–99)

## 2021-06-05 LAB — LACTIC ACID, PLASMA
Lactic Acid, Venous: 1.5 mmol/L (ref 0.5–1.9)
Lactic Acid, Venous: 1 mmol/L (ref 0.5–1.9)

## 2021-06-05 LAB — PROCALCITONIN: Procalcitonin: 6.72 ng/mL

## 2021-06-05 MED ORDER — LACTATED RINGERS IV SOLN: INTRAVENOUS | Status: DC

## 2021-06-05 MED ORDER — FOLIC ACID 5 MG/ML IJ SOLN
INTRAMUSCULAR | Status: AC
  Filled 2021-06-05: qty 0.2

## 2021-06-05 MED ORDER — CHLORHEXIDINE GLUCONATE 0.12 % MT SOLN
15.0000 mL | Freq: Two times a day (BID) | OROMUCOSAL | Status: DC
  Administered 2021-06-06 – 2021-06-11 (×10): 15 mL via OROMUCOSAL
  Filled 2021-06-05 (×12): qty 15

## 2021-06-05 MED ORDER — INSULIN GLARGINE-YFGN 100 UNIT/ML ~~LOC~~ SOLN
35.0000 [IU] | SUBCUTANEOUS | Status: DC
  Administered 2021-06-06: 35 [IU] via SUBCUTANEOUS
  Filled 2021-06-05 (×4): qty 0.35

## 2021-06-05 MED ORDER — CHLORHEXIDINE GLUCONATE CLOTH 2 % EX PADS
6.0000 | MEDICATED_PAD | Freq: Every day | CUTANEOUS | Status: DC
  Administered 2021-06-05 – 2021-06-12 (×8): 6 via TOPICAL

## 2021-06-05 MED ORDER — INSULIN ASPART 100 UNIT/ML IJ SOLN
0.0000 [IU] | INTRAMUSCULAR | Status: DC
  Administered 2021-06-05: 8 [IU] via SUBCUTANEOUS
  Administered 2021-06-05: 15 [IU] via SUBCUTANEOUS
  Administered 2021-06-05 – 2021-06-06 (×3): 5 [IU] via SUBCUTANEOUS
  Administered 2021-06-06 (×4): 3 [IU] via SUBCUTANEOUS
  Filled 2021-06-05: qty 1

## 2021-06-05 MED ORDER — SODIUM CHLORIDE 0.9 % IV SOLN
2.0000 g | INTRAVENOUS | Status: DC
  Administered 2021-06-05 – 2021-06-09 (×5): 2 g via INTRAVENOUS
  Filled 2021-06-05 (×2): qty 20
  Filled 2021-06-05: qty 2
  Filled 2021-06-05 (×3): qty 20

## 2021-06-05 MED ORDER — PANTOPRAZOLE SODIUM 40 MG IV SOLR
40.0000 mg | INTRAVENOUS | Status: DC
  Administered 2021-06-05: 40 mg via INTRAVENOUS
  Filled 2021-06-05: qty 40

## 2021-06-05 MED ORDER — ORAL CARE MOUTH RINSE
15.0000 mL | Freq: Two times a day (BID) | OROMUCOSAL | Status: DC
  Administered 2021-06-06 – 2021-06-11 (×8): 15 mL via OROMUCOSAL

## 2021-06-05 MED ORDER — M.V.I. ADULT IV INJ
INJECTION | INTRAVENOUS | Status: AC
  Filled 2021-06-05: qty 10

## 2021-06-05 MED ORDER — DEXAMETHASONE SODIUM PHOSPHATE 10 MG/ML IJ SOLN
6.0000 mg | INTRAMUSCULAR | Status: DC
  Administered 2021-06-05 – 2021-06-09 (×5): 6 mg via INTRAVENOUS
  Filled 2021-06-05 (×5): qty 1

## 2021-06-05 MED ORDER — THIAMINE HCL 100 MG/ML IJ SOLN
INTRAMUSCULAR | Status: AC
  Filled 2021-06-05: qty 2

## 2021-06-05 MED ORDER — METHYLPREDNISOLONE SODIUM SUCC 125 MG IJ SOLR: 60.0000 mg | Freq: Two times a day (BID) | INTRAMUSCULAR | Status: DC

## 2021-06-05 MED ORDER — SODIUM CHLORIDE 0.9 % IV SOLN: 2.0000 g | INTRAVENOUS | Status: DC

## 2021-06-05 MED ORDER — LABETALOL HCL 5 MG/ML IV SOLN: 10.0000 mg | INTRAVENOUS | Status: DC | PRN

## 2021-06-05 NOTE — ED Notes (Signed)
Pt turned and repositioned with sheets changed, flat sheet and warm blanket applied.

## 2021-06-05 NOTE — ED Notes (Signed)
Pt turned and repositioned.

## 2021-06-05 NOTE — ED Notes (Signed)
Blood Cx + with anaerobic gram negative rods reported by Mortimer Fries from Lab. Reported to dr. Josephine Cables.

## 2021-06-05 NOTE — ED Notes (Signed)
Pts temp noted to be 36.2c.warm blankets provided to pt. Will continue to monitor.

## 2021-06-05 NOTE — Progress Notes (Signed)
Pt transported from ED to ICU without any issues. Pt remains on Bipap.

## 2021-06-05 NOTE — Progress Notes (Signed)
PROGRESS NOTE    [name]  EQA:[id number] DOB: 1935-03-02 DOA: 06/04/2021 PCP: Celene Squibb, MD   Brief Narrative:   [name] is a 86 y.o. male with medical history significant of CAD, HLD, DM, HTN, CKD, B12 deficiency, mantle cell lymphoma of the colon, GERD, PVD, prostate CA, OSA not on CPAP who was seen in the ED on 827 with a positive COVID test.  Per the wife he took medication and felt some better from his COVID.  This morning when he was home he was found to be mildly hypoglycemic per his wife seemed mildly confused.  CT head negative and patient had low-grade fever on admission and urine analysis suggesting UTI.  He was empirically started on cefepime and vancomycin. He is currently on bipap due to some interval development of hypoxemia and respiratory distress.  Assessment & Plan:   Principal Problem:   Acute kidney injury ([place]) Active Problems:   Mixed hyperlipidemia   Hypertension   Coronary atherosclerosis of native coronary artery   GERD   Mantle cell lymphoma (HCC)   Prostate cancer (HCC)   Thrombocytopenia, unspecified (HCC)   B12 deficiency   Chronic kidney disease, stage 3b (HCC)   Mild cognitive impairment   Type 2 diabetes mellitus ([place])   COVID-19 virus infection   Severe Sepsis (POA) secondary to E. coli bacteremia related to UTI with noted AKI -Continue on current antibiotic treatment with Rocephin -Continue IV fluid and monitor repeat labs -Urine culture pending -Repeat blood cultures  Acute metabolic encephalopathy secondary to above in the setting of mild cognitive impairment -Discontinue sedating agents such as gabapentin and temazepam -Ativan only as needed for agitation -CT head with no acute findings  AKI on CKD stage IIIb -Likely prerenal in the setting of sepsis -Avoid nephrotoxic agents -Continue IV fluid hydration -Monitor strict I's and O's  Acute hypoxemic respiratory failure in the setting of COVID-19  infection -Wean Solu-Medrol to Decadron daily for now and monitor inflammatory markers -Avoid remdesivir as patient has completed course of Paxlovid -Currently on BiPAP -Chest x-ray with no acute findings -Consider pulmonology evaluation if condition worsening  Thrombocytopenia-acute on chronic -Likely secondary to sepsis -Continue monitor with repeat CBC and discontinue aspirin and Lovenox  Type 2 diabetes with hyperglycemia -A1c 9.2% -SSI adjusted to every 4 dosing -Hold home glipizide -Decrease Lantus insulin to 35 units until eating  Hypertension -Discontinue antihypertensives due to sepsis physiology -IV as needed labetalol  History of CAD -Hold statin and aspirin  History of mantle cell lymphoma -Has not seen hematology since 2019   DVT prophylaxis:Lovenox to SCDs Code Status: Full Family Communication: Tried calling wife with no response 9/13 Disposition Plan:  Status is: Inpatient  Remains inpatient appropriate because:Hemodynamically unstable, Altered mental status, IV treatments appropriate due to intensity of illness or inability to take PO, and Inpatient level of care appropriate due to severity of illness  Dispo: The patient is from: Home              Anticipated d/c is to: Home              Patient currently is not medically stable to d/c.   Difficult to place patient No   Consultants:  None  Procedures:  See below  Antimicrobials:  Anti-infectives (From admission, onward)    Start     Dose/Rate Route Frequency Ordered Stop   06/06/21 1400  vancomycin (VANCOREADY) IVPB 1750 mg/350 mL  Status:  Discontinued  1,750 mg 175 mL/hr over 120 Minutes Intravenous Every 48 hours 06/04/21 1408 06/04/21 1745   06/05/21 2200  cefTRIAXone (ROCEPHIN) 2 g in sodium chloride 0.9 % 100 mL IVPB        2 g 200 mL/hr over 30 Minutes Intravenous Every 24 hours 06/05/21 0914     06/05/21 1000  ceFEPIme (MAXIPIME) 2 g in sodium chloride 0.9 % 100 mL IVPB  Status:   Discontinued        2 g 200 mL/hr over 30 Minutes Intravenous Every 24 hours 06/04/21 1408 06/04/21 1859   06/05/21 0800  ceFEPIme (MAXIPIME) 2 g in sodium chloride 0.9 % 100 mL IVPB  Status:  Discontinued        2 g 200 mL/hr over 30 Minutes Intravenous Every 24 hours 06/05/21 0519 06/05/21 0913   06/04/21 2130  cefTRIAXone (ROCEPHIN) 2 g in sodium chloride 0.9 % 100 mL IVPB        2 g 200 mL/hr over 30 Minutes Intravenous  Once 06/04/21 2117 06/04/21 2218   06/04/21 1300  vancomycin (VANCOREADY) IVPB 2000 mg/400 mL        2,000 mg 200 mL/hr over 120 Minutes Intravenous  Once 06/04/21 1236 06/04/21 1729   06/04/21 1230  ceFEPIme (MAXIPIME) 2 g in sodium chloride 0.9 % 100 mL IVPB        2 g 200 mL/hr over 30 Minutes Intravenous  Once 06/04/21 1223 06/04/21 1341   06/04/21 1230  metroNIDAZOLE (FLAGYL) IVPB 500 mg        500 mg 100 mL/hr over 60 Minutes Intravenous  Once 06/04/21 1223 06/04/21 1516   06/04/21 1230  vancomycin (VANCOCIN) IVPB 1000 mg/200 mL premix  Status:  Discontinued        1,000 mg 200 mL/hr over 60 Minutes Intravenous  Once 06/04/21 1223 06/04/21 1236       Subjective: Patient seen and evaluated today and appears to be quite somnolent on BiPAP after receiving some Ativan for agitation.  No acute overnight events noted.  Objective: Vitals:   06/05/21 0730 06/05/21 1000 06/05/21 1030 06/05/21 1100  BP: 116/77 112/78 115/80 122/80  Pulse: 61 (!) 59 (!) 53 (!) 57  Resp: 20 18 18 19   Temp: (!) 97 F (36.1 C) (!) 96.8 F (36 C) (!) 96.8 F (36 C) (!) 96.8 F (36 C)  TempSrc:      SpO2: 99% 99% 99% 99%  Weight:      Height:        Intake/Output Summary (Last 24 hours) at 06/05/2021 1151 Last data filed at 06/05/2021 0600 Gross per 24 hour  Intake 8643.94 ml  Output 1780 ml  Net 6863.94 ml   Filed Weights   06/04/21 1111  Weight: 100 kg    Examination:  General exam: Appears somnolent Respiratory system: Clear to auscultation. Respiratory effort  normal.  Currently on BiPAP FiO2 40% Cardiovascular system: S1 & S2 heard, RRR.  Gastrointestinal system: Abdomen is soft Central nervous system: Somnolent Extremities: No edema Skin: No significant lesions noted Psychiatry: Cannot be assessed    Data Reviewed: I have personally reviewed following labs and imaging studies  CBC: Recent Labs  Lab 06/04/21 1239 06/05/21 0705  WBC 6.0 7.3  NEUTROABS 5.0 6.5  HGB 15.7 13.8  HCT 48.1 43.8  MCV 92.0 94.2  PLT 64* 55*   Basic Metabolic Panel: Recent Labs  Lab 06/04/21 1239 06/05/21 0705  NA 134* 139  K 4.4 4.4  CL 105 107  CO2 19* 23  GLUCOSE 275* 306*  BUN 46* 48*  CREATININE 2.07* 2.21*  CALCIUM 8.0* 7.5*   GFR: Estimated Creatinine Clearance: 30.5 mL/min (A) (by C-G formula based on SCr of 2.21 mg/dL (H)). Liver Function Tests: Recent Labs  Lab 06/04/21 1239 06/05/21 0705  AST 21 23  ALT 19 17  ALKPHOS 55 43  BILITOT 2.3* 1.1  PROT 6.8 5.3*  ALBUMIN 3.9 2.8*   No results for input(s): LIPASE, AMYLASE in the last 168 hours. No results for input(s): AMMONIA in the last 168 hours. Coagulation Profile: Recent Labs  Lab 06/04/21 1239  INR 1.1   Cardiac Enzymes: No results for input(s): CKTOTAL, CKMB, CKMBINDEX, TROPONINI in the last 168 hours. BNP (last 3 results) No results for input(s): PROBNP in the last 8760 hours. HbA1C: Recent Labs    06/04/21 1239  HGBA1C 9.2*   CBG: Recent Labs  Lab 06/04/21 2119 06/05/21 0640  GLUCAP 225* 247*   Lipid Profile: No results for input(s): CHOL, HDL, LDLCALC, TRIG, CHOLHDL, LDLDIRECT in the last 72 hours. Thyroid Function Tests: Recent Labs    06/04/21 1239  TSH 3.553   Anemia Panel: No results for input(s): VITAMINB12, FOLATE, FERRITIN, TIBC, IRON, RETICCTPCT in the last 72 hours. Sepsis Labs: Recent Labs  Lab 06/04/21 2132 06/04/21 2315 06/05/21 0458 06/05/21 0705 06/05/21 0915  PROCALCITON  --   --  6.72  --   --   LATICACIDVEN 1.5 1.5  --   1.5 1.0    Recent Results (from the past 240 hour(s))  Resp Panel by RT-PCR (Flu A&B, Covid) Nasopharyngeal Swab     Status: Abnormal   Collection Time: 06/04/21 12:23 PM   Specimen: Nasopharyngeal Swab; Nasopharyngeal(NP) swabs in vial transport medium  Result Value Ref Range Status   SARS Coronavirus 2 by RT PCR POSITIVE (A) NEGATIVE Final    Comment: CRITICAL RESULT CALLED TO, READ BACK BY AND VERIFIED WITH: CRAWFORD,H AT 1344 ON 9.12.22 BY RUCINSKI, B (NOTE) SARS-CoV-2 target nucleic acids are DETECTED.  The SARS-CoV-2 RNA is generally detectable in upper respiratory specimens during the acute phase of infection. Positive results are indicative of the presence of the identified virus, but do not rule out bacterial infection or co-infection with other pathogens not detected by the test. Clinical correlation with patient history and other diagnostic information is necessary to determine patient infection status. The expected result is Negative.  Fact Sheet for Patients: EntrepreneurPulse.com.au  Fact Sheet for Healthcare Providers: IncredibleEmployment.be  This test is not yet approved or cleared by the [place] FDA and  has been authorized for detection and/or diagnosis of SARS-CoV-2 by FDA under an Emergency Use Authorization (EUA).  This EUA will remain in effect (mean ing this test can be used) for the duration of  the COVID-19 declaration under Section 564(b)(1) of the Act, 21 U.S.C. section 360bbb-3(b)(1), unless the authorization is terminated or revoked sooner.     Influenza A by PCR NEGATIVE NEGATIVE Final   Influenza B by PCR NEGATIVE NEGATIVE Final    Comment: (NOTE) The Xpert Xpress SARS-CoV-2/FLU/RSV plus assay is intended as an aid in the diagnosis of influenza from Nasopharyngeal swab specimens and should not be used as a sole basis for treatment. Nasal washings and aspirates are unacceptable for Xpert Xpress  SARS-CoV-2/FLU/RSV testing.  Fact Sheet for Patients: EntrepreneurPulse.com.au  Fact Sheet for Healthcare Providers: IncredibleEmployment.be  This test is not yet approved or cleared by the [place] FDA and has been authorized for detection  and/or diagnosis of SARS-CoV-2 by FDA under an Emergency Use Authorization (EUA). This EUA will remain in effect (meaning this test can be used) for the duration of the COVID-19 declaration under Section 564(b)(1) of the Act, 21 U.S.C. section 360bbb-3(b)(1), unless the authorization is terminated or revoked.  Performed at [facility], [address], [place], [place] [address]   Blood Culture (routine x 2)     Status: None (Preliminary result)   Collection Time: 06/04/21 12:39 PM   Specimen: BLOOD LEFT HAND  Result Value Ref Range Status   Specimen Description   Final    BLOOD LEFT HAND BOTTLES DRAWN AEROBIC AND ANAEROBIC Performed at [facility], [address] [place], [place] [address]    Special Requests   Final    Blood Culture adequate volume Performed at [facility], [address], [place], [place] [address]    Culture  Setup Time   Final    ANAEROBIC BOTTLE ONLY GRAM NEGATIVE RODS Gram Stain Report Called to,Read Back By and Verified With: Belton,k@0044  by Matthews,b 9.13.22 CRITICAL RESULT CALLED TO, READ BACK BY AND VERIFIED WITH: RN CHRISTY DAWSON 06/05/21@3 :57 BY TW     Culture   Final    CULTURE REINCUBATED FOR BETTER GROWTH Performed at [facility], [place] [address] [place], [place] [address]    Report Status PENDING  Incomplete  Blood Culture ID Panel (Reflexed)     Status: Abnormal   Collection Time: 06/04/21 12:39 PM  Result Value Ref Range Status   Enterococcus faecalis NOT DETECTED NOT DETECTED Final   Enterococcus Faecium NOT DETECTED NOT DETECTED Final   Listeria monocytogenes NOT DETECTED NOT DETECTED Final   Staphylococcus species NOT DETECTED NOT DETECTED Final    Staphylococcus aureus (BCID) NOT DETECTED NOT DETECTED Final   Staphylococcus epidermidis NOT DETECTED NOT DETECTED Final   Staphylococcus lugdunensis NOT DETECTED NOT DETECTED Final   Streptococcus species NOT DETECTED NOT DETECTED Final   Streptococcus agalactiae NOT DETECTED NOT DETECTED Final   Streptococcus pneumoniae NOT DETECTED NOT DETECTED Final   Streptococcus pyogenes NOT DETECTED NOT DETECTED Final   A.calcoaceticus-baumannii NOT DETECTED NOT DETECTED Final   Bacteroides fragilis NOT DETECTED NOT DETECTED Final   Enterobacterales DETECTED (A) NOT DETECTED Final    Comment: Enterobacterales represent a large order of gram negative bacteria, not a single organism. CRITICAL RESULT CALLED TO, READ BACK BY AND VERIFIED WITH: RN CHRISTY DAWSON 06/05/21@3 :57 BY TW    Enterobacter cloacae complex NOT DETECTED NOT DETECTED Final   Escherichia coli DETECTED (A) NOT DETECTED Final    Comment: CRITICAL RESULT CALLED TO, READ BACK BY AND VERIFIED WITH: RN CHRISTY DAWSON 06/05/21@3 :57 BY TW    Klebsiella aerogenes NOT DETECTED NOT DETECTED Final   Klebsiella oxytoca NOT DETECTED NOT DETECTED Final   Klebsiella pneumoniae NOT DETECTED NOT DETECTED Final   Proteus species NOT DETECTED NOT DETECTED Final   Salmonella species NOT DETECTED NOT DETECTED Final   Serratia marcescens NOT DETECTED NOT DETECTED Final   Haemophilus influenzae NOT DETECTED NOT DETECTED Final   Neisseria meningitidis NOT DETECTED NOT DETECTED Final   Pseudomonas aeruginosa NOT DETECTED NOT DETECTED Final   Stenotrophomonas maltophilia NOT DETECTED NOT DETECTED Final   Candida albicans NOT DETECTED NOT DETECTED Final   Candida auris NOT DETECTED NOT DETECTED Final   Candida glabrata NOT DETECTED NOT DETECTED Final   Candida krusei NOT DETECTED NOT DETECTED Final   Candida parapsilosis NOT DETECTED NOT DETECTED Final   Candida tropicalis NOT  DETECTED NOT DETECTED Final   Cryptococcus neoformans/gattii NOT DETECTED  NOT DETECTED Final   CTX-M ESBL NOT DETECTED NOT DETECTED Final   Carbapenem resistance IMP NOT DETECTED NOT DETECTED Final   Carbapenem resistance KPC NOT DETECTED NOT DETECTED Final   Carbapenem resistance NDM NOT DETECTED NOT DETECTED Final   Carbapenem resist OXA 48 LIKE NOT DETECTED NOT DETECTED Final   Carbapenem resistance VIM NOT DETECTED NOT DETECTED Final    Comment: Performed at [facility], [place] [address], [place], [place] [address]  Blood Culture (routine x 2)     Status: None (Preliminary result)   Collection Time: 06/04/21 12:40 PM   Specimen: BLOOD RIGHT FOREARM  Result Value Ref Range Status   Specimen Description   Final    BLOOD RIGHT FOREARM BOTTLES DRAWN AEROBIC AND ANAEROBIC   Special Requests Blood Culture adequate volume  Final   Culture  Setup Time   Final    ANAEROBIC BOTTLE ONLY GRAM NEGATIVE RODS Gram Stain Report Called to,Read Back By and Verified With: SPENCE,[id number]  BY MATTHEWS, B 9.13.22    Culture   Final    NO GROWTH < 24 HOURS Performed at [facility], [address] [place], [place] [address]    Report Status PENDING  Incomplete         Radiology Studies: CT HEAD WO CONTRAST (5MM)  Result Date: 06/04/2021 CLINICAL DATA:  Dizziness and weakness today. Recent COVID-19 infection. Cerebral hemorrhage suspected. EXAM: CT HEAD WITHOUT CONTRAST TECHNIQUE: Contiguous axial images were obtained from the base of the skull through the vertex without intravenous contrast. COMPARISON:  CT head 05/05/2021 and 02/17/2018. FINDINGS: Brain: There is no evidence of acute intracranial hemorrhage, mass lesion, brain edema or extra-axial fluid collection. Atrophy with prominence of the ventricles and subarachnoid spaces. There is stable mild low-density in the periventricular white matter, most consistent with chronic small vessel ischemic change. There is no CT evidence of acute cortical infarction. Vascular: Mild intracranial atherosclerosis. No hyperdense  vessel identified. Skull: Negative for fracture or focal lesion. Sinuses/Orbits: New mucosal thickening and partial opacification of the bilateral ethmoid, right frontal and right maxillary sinuses without air-fluid levels. The mastoid air cells and middle ears are clear. Stable postsurgical changes in both orbits. Other: None. IMPRESSION: 1. Stable appearance of the brain without acute intracranial findings. 2. New paranasal sinus mucosal thickening and partial opacification consistent with sinusitis. Electronically Signed   By: Richardean Sale M.D.   On: 06/04/2021 14:40   DG CHEST PORT 1 VIEW  Result Date: 06/04/2021 CLINICAL DATA:  Acute hypoxic respiratory failure. COVID-19 infection. History of diabetes. EXAM: PORTABLE CHEST 1 VIEW COMPARISON:  Radiographs 06/04/2021 and 05/19/2021.  CT 05/19/2021. FINDINGS: 2007 hours. Lower lung volumes with mild patient rotation to the left. The heart size and mediastinal contours are stable with aortic atherosclerosis. There are lower lung volumes with mildly increased patchy opacities at both lung bases, probably atelectasis. No confluent airspace opacity or typical ground-glass opacities of viral infection. No pleural effusion or pneumothorax. The bones appear unchanged status post lower cervical fusion. IMPRESSION: Lower lung volumes with probable mildly increased bibasilar atelectasis. No consolidation or typical findings of viral pneumonia. Electronically Signed   By: Richardean Sale M.D.   On: 06/04/2021 20:27   DG Chest Port 1 View  Result Date: 06/04/2021 CLINICAL DATA:  Questionable sepsis EXAM: PORTABLE CHEST 1 VIEW COMPARISON:  05/19/2021 FINDINGS: The heart size and mediastinal contours are within normal limits. Mild bibasilar atelectasis is again  noted. No pleural effusion or pneumothorax. The visualized skeletal structures are unremarkable. IMPRESSION: Mild bibasilar atelectasis. Electronically Signed   By: Merilyn Baba M.D.   On: 06/04/2021 13:17         Scheduled Meds:  amLODipine  5 mg Oral Daily   aspirin EC  81 mg Oral Daily   atorvastatin  20 mg Oral Daily   enoxaparin (LOVENOX) injection  40 mg Subcutaneous Q24H   gabapentin  300 mg Oral Daily   And   gabapentin  600 mg Oral QHS   glipiZIDE  10 mg Oral BID AC   insulin aspart  0-15 Units Subcutaneous TID WC   insulin glargine-yfgn  70 Units Subcutaneous BH-q7a   ipratropium  2 spray Each Nare BID   methylPREDNISolone (SOLU-MEDROL) injection  125 mg Intravenous Q12H   mometasone-formoterol  2 puff Inhalation BID   pantoprazole  40 mg Oral BID   propranolol ER  120 mg Oral QHS   Continuous Infusions:  cefTRIAXone (ROCEPHIN)  IV       LOS: 0 days    Time spent: 35 minutes    Cearra Portnoy Darleen Crocker, DO [facility]  If 7PM-7AM, please contact night-coverage [email or web address] 06/05/2021, 11:51 AM

## 2021-06-05 NOTE — ED Notes (Signed)
Pts family given update

## 2021-06-05 NOTE — Progress Notes (Addendum)
RN reported that second anaerobic culture was positive for gram-negative rods.  Patient was already treated with ceftriaxone and cefepime empirically.  We shall continue with cefepime at this time with plan to de-escalate based on procalcitonin, blood culture and urine culture.

## 2021-06-05 NOTE — ED Notes (Signed)
Dr. Pierce Crane notified of positive blood culture results.

## 2021-06-06 DIAGNOSIS — N1832 Chronic kidney disease, stage 3b: Secondary | ICD-10-CM

## 2021-06-06 DIAGNOSIS — U071 COVID-19: Secondary | ICD-10-CM

## 2021-06-06 DIAGNOSIS — N179 Acute kidney failure, unspecified: Secondary | ICD-10-CM | POA: Diagnosis not present

## 2021-06-06 DIAGNOSIS — K219 Gastro-esophageal reflux disease without esophagitis: Secondary | ICD-10-CM

## 2021-06-06 DIAGNOSIS — G3184 Mild cognitive impairment, so stated: Secondary | ICD-10-CM

## 2021-06-06 DIAGNOSIS — I10 Essential (primary) hypertension: Secondary | ICD-10-CM

## 2021-06-06 LAB — GLUCOSE, CAPILLARY
Glucose-Capillary: 182 mg/dL — ABNORMAL HIGH (ref 70–99)
Glucose-Capillary: 165 mg/dL — ABNORMAL HIGH (ref 70–99)
Glucose-Capillary: 200 mg/dL — ABNORMAL HIGH (ref 70–99)
Glucose-Capillary: 233 mg/dL — ABNORMAL HIGH (ref 70–99)
Glucose-Capillary: 207 mg/dL — ABNORMAL HIGH (ref 70–99)

## 2021-06-06 LAB — CBC
HCT: 44.2 % (ref 39.0–52.0)
Hemoglobin: 14.1 g/dL (ref 13.0–17.0)
MCH: 29.8 pg (ref 26.0–34.0)
MCHC: 31.9 g/dL (ref 30.0–36.0)
MCV: 93.4 fL (ref 80.0–100.0)
Platelets: 67 10*3/uL — ABNORMAL LOW (ref 150–400)
RBC: 4.73 MIL/uL (ref 4.22–5.81)
RDW: 16.3 % — ABNORMAL HIGH (ref 11.5–15.5)
WBC: 8.1 10*3/uL (ref 4.0–10.5)
nRBC: 0 % (ref 0.0–0.2)

## 2021-06-06 LAB — BASIC METABOLIC PANEL
Anion gap: 9 (ref 5–15)
BUN: 55 mg/dL — ABNORMAL HIGH (ref 8–23)
CO2: 26 mmol/L (ref 22–32)
Calcium: 8 mg/dL — ABNORMAL LOW (ref 8.9–10.3)
Chloride: 108 mmol/L (ref 98–111)
Creatinine, Ser: 1.95 mg/dL — ABNORMAL HIGH (ref 0.61–1.24)
GFR, Estimated: 34 mL/min — ABNORMAL LOW (ref >60)
Glucose, Bld: 196 mg/dL — ABNORMAL HIGH (ref 70–99)
Potassium: 3.9 mmol/L (ref 3.5–5.1)
Sodium: 143 mmol/L (ref 135–145)

## 2021-06-06 LAB — HEMOGLOBIN AND HEMATOCRIT, BLOOD
HCT: 43.5 % (ref 39.0–52.0)
Hemoglobin: 14 g/dL (ref 13.0–17.0)

## 2021-06-06 LAB — MAGNESIUM: Magnesium: 1.8 mg/dL (ref 1.7–2.4)

## 2021-06-06 MED ORDER — PANTOPRAZOLE SODIUM 40 MG PO TBEC
40.0000 mg | DELAYED_RELEASE_TABLET | Freq: Every day | ORAL | Status: DC
  Administered 2021-06-07 – 2021-06-12 (×5): 40 mg via ORAL
  Filled 2021-06-06 (×5): qty 1

## 2021-06-06 MED ORDER — AMLODIPINE BESYLATE 5 MG PO TABS
5.0000 mg | ORAL_TABLET | Freq: Every day | ORAL | Status: DC
  Administered 2021-06-06 – 2021-06-12 (×7): 5 mg via ORAL
  Filled 2021-06-06 (×7): qty 1

## 2021-06-06 MED ORDER — PROPRANOLOL HCL ER 80 MG PO CP24
80.0000 mg | ORAL_CAPSULE | Freq: Every day | ORAL | Status: DC
  Administered 2021-06-06 – 2021-06-10 (×5): 80 mg via ORAL
  Filled 2021-06-06 (×9): qty 1

## 2021-06-06 MED ORDER — LABETALOL HCL 5 MG/ML IV SOLN: 10.0000 mg | INTRAVENOUS | Status: DC | PRN

## 2021-06-06 MED ORDER — ASPIRIN EC 81 MG PO TBEC
81.0000 mg | DELAYED_RELEASE_TABLET | Freq: Every day | ORAL | Status: DC
  Administered 2021-06-06 – 2021-06-12 (×7): 81 mg via ORAL
  Filled 2021-06-06 (×7): qty 1

## 2021-06-06 MED ORDER — ATORVASTATIN CALCIUM 20 MG PO TABS
20.0000 mg | ORAL_TABLET | Freq: Every day | ORAL | Status: DC
  Administered 2021-06-06 – 2021-06-12 (×7): 20 mg via ORAL
  Filled 2021-06-06 (×7): qty 1

## 2021-06-06 NOTE — Plan of Care (Signed)

## 2021-06-06 NOTE — Progress Notes (Signed)
Patient taken off BIPAP and placed on 2L nasal cannula.  RT will continue to monitor.

## 2021-06-06 NOTE — Progress Notes (Signed)
PROGRESS NOTE    [name]  UUV:[id number] DOB: [birth year] DOA: 06/04/2021 PCP: Celene Squibb, MD   Brief Narrative:   [name] is a 86 y.o. male with medical history significant of CAD, HLD, DM, HTN, CKD, B12 deficiency, mantle cell lymphoma of the colon, GERD, PVD, prostate CA, OSA not on CPAP who was seen in the ED on 827 with a positive COVID test.  Per the wife he took medication and felt some better from his COVID.  This morning when he was home he was found to be mildly hypoglycemic per his wife seemed mildly confused.  CT head negative and patient had low-grade fever on admission and urine analysis suggesting UTI.  He was empirically started on cefepime and vancomycin. He is currently on bipap due to some interval development of hypoxemia and respiratory distress.  Assessment & Plan:   Principal Problem:   Acute kidney injury ([place]) Active Problems:   Mixed hyperlipidemia   Hypertension   Coronary atherosclerosis of native coronary artery   GERD   Mantle cell lymphoma (HCC)   Prostate cancer (HCC)   Thrombocytopenia, unspecified (HCC)   B12 deficiency   Chronic kidney disease, stage 3b (HCC)   Mild cognitive impairment   Type 2 diabetes mellitus ([place])   COVID-19 virus infection   Severe Sepsis (POA) secondary to E. coli bacteremia related to UTI with noted AKI -Continue on current antibiotic treatment with Rocephin -Continue IV fluid and monitor repeat labs -Urine culture pending -Repeat blood cultures no growth to date  Acute metabolic encephalopathy secondary to above in the setting of mild cognitive impairment -Discontinue sedating agents such as gabapentin and temazepam -Ativan only as needed for agitation -CT head with no acute findings  AKI on CKD stage IIIb -Likely prerenal in the setting of sepsis -Avoid nephrotoxic agents -Continue IV fluid hydration -Monitor strict I's and O's  Acute hypoxemic respiratory failure in the setting of  COVID-19 infection -Wean Solu-Medrol to Decadron daily for now and monitor inflammatory markers -Avoid remdesivir as patient has completed course of Paxlovid -Currently on BiPAP,  plan to wean to room air today.  -Chest x-ray with no acute findings -Consider pulmonology evaluation if condition worsening  Thrombocytopenia-acute on chronic -Likely secondary to sepsis -Continue monitor with repeat CBC and discontinue aspirin and Lovenox  Type 2 diabetes with hyperglycemia -A1c 9.2% -SSI adjusted to every 4 dosing -Hold home glipizide -Decrease Lantus insulin to 35 units until eating  Hypertension - uncontrolled -resume home BP medications -IV as needed labetalol  History of CAD -resume home statin and aspirin  History of mantle cell lymphoma -Has not seen hematology since 2019   DVT prophylaxis:Lovenox to SCDs Code Status: Full Family Communication: wife Disposition Plan: TBD Status is: Inpatient  Remains inpatient appropriate because:Hemodynamically unstable, Altered mental status, IV treatments appropriate due to intensity of illness or inability to take PO, and Inpatient level of care appropriate due to severity of illness  Dispo: The patient is from: Home              Anticipated d/c is to: Home              Patient currently is not medically stable to d/c.   Difficult to place patient No   Consultants:  None  Procedures:  See below  Antimicrobials:  Anti-infectives (From admission, onward)    Start     Dose/Rate Route Frequency Ordered Stop   06/06/21 1400  vancomycin (VANCOREADY) IVPB 1750 mg/350  mL  Status:  Discontinued        1,750 mg 175 mL/hr over 120 Minutes Intravenous Every 48 hours 06/04/21 1408 06/04/21 1745   06/05/21 2200  cefTRIAXone (ROCEPHIN) 2 g in sodium chloride 0.9 % 100 mL IVPB        2 g 200 mL/hr over 30 Minutes Intravenous Every 24 hours 06/05/21 0914     06/05/21 1000  ceFEPIme (MAXIPIME) 2 g in sodium chloride 0.9 % 100 mL IVPB   Status:  Discontinued        2 g 200 mL/hr over 30 Minutes Intravenous Every 24 hours 06/04/21 1408 06/04/21 1859   06/05/21 0800  ceFEPIme (MAXIPIME) 2 g in sodium chloride 0.9 % 100 mL IVPB  Status:  Discontinued        2 g 200 mL/hr over 30 Minutes Intravenous Every 24 hours 06/05/21 0519 06/05/21 0913   06/04/21 2130  cefTRIAXone (ROCEPHIN) 2 g in sodium chloride 0.9 % 100 mL IVPB        2 g 200 mL/hr over 30 Minutes Intravenous  Once 06/04/21 2117 06/04/21 2218   06/04/21 1300  vancomycin (VANCOREADY) IVPB 2000 mg/400 mL        2,000 mg 200 mL/hr over 120 Minutes Intravenous  Once 06/04/21 1236 06/04/21 1729   06/04/21 1230  ceFEPIme (MAXIPIME) 2 g in sodium chloride 0.9 % 100 mL IVPB        2 g 200 mL/hr over 30 Minutes Intravenous  Once 06/04/21 1223 06/04/21 1341   06/04/21 1230  metroNIDAZOLE (FLAGYL) IVPB 500 mg        500 mg 100 mL/hr over 60 Minutes Intravenous  Once 06/04/21 1223 06/04/21 1516   06/04/21 1230  vancomycin (VANCOCIN) IVPB 1000 mg/200 mL premix  Status:  Discontinued        1,000 mg 200 mL/hr over 60 Minutes Intravenous  Once 06/04/21 1223 06/04/21 1236       Subjective: Patient upset he is in hospital and wants to go home,  he is confused.   Objective: Vitals:   06/06/21 0800 06/06/21 0900 06/06/21 1000 06/06/21 1106  BP: (!) 170/87 (!) 167/87 (!) 172/92   Pulse: 71     Resp: 20 (!) 21 14   Temp: (!) 97.2 F (36.2 C) (!) 97 F (36.1 C) (!) 97 F (36.1 C) 97.6 F (36.4 C)  TempSrc: Bladder   Oral  SpO2: 99%     Weight:      Height:        Intake/Output Summary (Last 24 hours) at 06/06/2021 1136 Last data filed at 06/06/2021 0600 Gross per 24 hour  Intake 1521.47 ml  Output 1475 ml  Net 46.47 ml   Filed Weights   06/04/21 1111 06/05/21 1600 06/06/21 0400  Weight: 100 kg 101.9 kg 102.4 kg    Examination:  General exam: awake, alert, confused, NAD.  Respiratory system: speaking full sentences while on bipap, no increased work of  breathing. Cardiovascular system: normal S1 & S2 heard.   Gastrointestinal system: Abdomen is soft, ND/NT, no HSM.  Central nervous system: nonfocal.  Extremities: no C/C/E Skin: no gross lesions seen.  Psychiatry: confused with signs of dementia.   Data Reviewed: I have personally reviewed following labs and imaging studies  CBC: Recent Labs  Lab 06/04/21 1239 06/05/21 0705 06/06/21 0444  WBC 6.0 7.3 8.1  NEUTROABS 5.0 6.5  --   HGB 15.7 13.8 14.1  HCT 48.1 43.8 44.2  MCV 92.0 94.2  93.4  PLT 64* 55* 67*   Basic Metabolic Panel: Recent Labs  Lab 06/04/21 1239 06/05/21 0705 06/06/21 0444  NA 134* 139 143  K 4.4 4.4 3.9  CL 105 107 108  CO2 19* 23 26  GLUCOSE 275* 306* 196*  BUN 46* 48* 55*  CREATININE 2.07* 2.21* 1.95*  CALCIUM 8.0* 7.5* 8.0*  MG  --   --  1.8   GFR: Estimated Creatinine Clearance: 35 mL/min (A) (by C-G formula based on SCr of 1.95 mg/dL (H)). Liver Function Tests: Recent Labs  Lab 06/04/21 1239 06/05/21 0705  AST 21 23  ALT 19 17  ALKPHOS 55 43  BILITOT 2.3* 1.1  PROT 6.8 5.3*  ALBUMIN 3.9 2.8*   No results for input(s): LIPASE, AMYLASE in the last 168 hours. No results for input(s): AMMONIA in the last 168 hours. Coagulation Profile: Recent Labs  Lab 06/04/21 1239  INR 1.1   Cardiac Enzymes: No results for input(s): CKTOTAL, CKMB, CKMBINDEX, TROPONINI in the last 168 hours. BNP (last 3 results) No results for input(s): PROBNP in the last 8760 hours. HbA1C: Recent Labs    06/04/21 1239  HGBA1C 9.2*   CBG: Recent Labs  Lab 06/05/21 1955 06/05/21 2357 06/06/21 0436 06/06/21 0751 06/06/21 1104  GLUCAP 211* 181* 182* 165* 200*   Lipid Profile: No results for input(s): CHOL, HDL, LDLCALC, TRIG, CHOLHDL, LDLDIRECT in the last 72 hours. Thyroid Function Tests: Recent Labs    06/04/21 1239  TSH 3.553   Anemia Panel: No results for input(s): VITAMINB12, FOLATE, FERRITIN, TIBC, IRON, RETICCTPCT in the last 72  hours. Sepsis Labs: Recent Labs  Lab 06/04/21 2132 06/04/21 2315 06/05/21 0458 06/05/21 0705 06/05/21 0915  PROCALCITON  --   --  6.72  --   --   LATICACIDVEN 1.5 1.5  --  1.5 1.0    Recent Results (from the past 240 hour(s))  Resp Panel by RT-PCR (Flu A&B, Covid) Nasopharyngeal Swab     Status: Abnormal   Collection Time: 06/04/21 12:23 PM   Specimen: Nasopharyngeal Swab; Nasopharyngeal(NP) swabs in vial transport medium  Result Value Ref Range Status   SARS Coronavirus 2 by RT PCR POSITIVE (A) NEGATIVE Final    Comment: CRITICAL RESULT CALLED TO, READ BACK BY AND VERIFIED WITH: CRAWFORD,H AT 1344 ON 9.12.22 BY RUCINSKI, B (NOTE) SARS-CoV-2 target nucleic acids are DETECTED.  The SARS-CoV-2 RNA is generally detectable in upper respiratory specimens during the acute phase of infection. Positive results are indicative of the presence of the identified virus, but do not rule out bacterial infection or co-infection with other pathogens not detected by the test. Clinical correlation with patient history and other diagnostic information is necessary to determine patient infection status. The expected result is Negative.  Fact Sheet for Patients: EntrepreneurPulse.com.au  Fact Sheet for Healthcare Providers: IncredibleEmployment.be  This test is not yet approved or cleared by the [place] FDA and  has been authorized for detection and/or diagnosis of SARS-CoV-2 by FDA under an Emergency Use Authorization (EUA).  This EUA will remain in effect (mean ing this test can be used) for the duration of  the COVID-19 declaration under Section 564(b)(1) of the Act, 21 U.S.C. section 360bbb-3(b)(1), unless the authorization is terminated or revoked sooner.     Influenza A by PCR NEGATIVE NEGATIVE Final   Influenza B by PCR NEGATIVE NEGATIVE Final    Comment: (NOTE) The Xpert Xpress SARS-CoV-2/FLU/RSV plus assay is intended as an aid in the  diagnosis of  influenza from Nasopharyngeal swab specimens and should not be used as a sole basis for treatment. Nasal washings and aspirates are unacceptable for Xpert Xpress SARS-CoV-2/FLU/RSV testing.  Fact Sheet for Patients: EntrepreneurPulse.com.au  Fact Sheet for Healthcare Providers: IncredibleEmployment.be  This test is not yet approved or cleared by the [place] FDA and has been authorized for detection and/or diagnosis of SARS-CoV-2 by FDA under an Emergency Use Authorization (EUA). This EUA will remain in effect (meaning this test can be used) for the duration of the COVID-19 declaration under Section 564(b)(1) of the Act, 21 U.S.C. section 360bbb-3(b)(1), unless the authorization is terminated or revoked.  Performed at [facility], [address], [place], [place] [address]   Blood Culture (routine x 2)     Status: Abnormal (Preliminary result)   Collection Time: 06/04/21 12:39 PM   Specimen: BLOOD LEFT HAND  Result Value Ref Range Status   Specimen Description   Final    BLOOD LEFT HAND BOTTLES DRAWN AEROBIC AND ANAEROBIC Performed at [facility], [address] [place], [place] [address]    Special Requests   Final    Blood Culture adequate volume Performed at [facility], [address], [place], [place] [address]    Culture  Setup Time   Final    ANAEROBIC BOTTLE ONLY GRAM NEGATIVE RODS Gram Stain Report Called to,Read Back By and Verified With: Belton,k@0044  by Matthews,b 9.13.22 CRITICAL RESULT CALLED TO, READ BACK BY AND VERIFIED WITH: RN CHRISTY DAWSON 06/05/21@3 :57 BY TW     Culture (A)  Final    ESCHERICHIA COLI SUSCEPTIBILITIES TO FOLLOW Performed at [facility], [place] [address], [place], [place] [address]    Report Status PENDING  Incomplete  Blood Culture ID Panel (Reflexed)     Status: Abnormal   Collection Time: 06/04/21 12:39 PM  Result Value Ref Range Status   Enterococcus faecalis NOT DETECTED  NOT DETECTED Final   Enterococcus Faecium NOT DETECTED NOT DETECTED Final   Listeria monocytogenes NOT DETECTED NOT DETECTED Final   Staphylococcus species NOT DETECTED NOT DETECTED Final   Staphylococcus aureus (BCID) NOT DETECTED NOT DETECTED Final   Staphylococcus epidermidis NOT DETECTED NOT DETECTED Final   Staphylococcus lugdunensis NOT DETECTED NOT DETECTED Final   Streptococcus species NOT DETECTED NOT DETECTED Final   Streptococcus agalactiae NOT DETECTED NOT DETECTED Final   Streptococcus pneumoniae NOT DETECTED NOT DETECTED Final   Streptococcus pyogenes NOT DETECTED NOT DETECTED Final   A.calcoaceticus-baumannii NOT DETECTED NOT DETECTED Final   Bacteroides fragilis NOT DETECTED NOT DETECTED Final   Enterobacterales DETECTED (A) NOT DETECTED Final    Comment: Enterobacterales represent a large order of gram negative bacteria, not a single organism. CRITICAL RESULT CALLED TO, READ BACK BY AND VERIFIED WITH: RN CHRISTY DAWSON 06/05/21@3 :57 BY TW    Enterobacter cloacae complex NOT DETECTED NOT DETECTED Final   Escherichia coli DETECTED (A) NOT DETECTED Final    Comment: CRITICAL RESULT CALLED TO, READ BACK BY AND VERIFIED WITH: RN CHRISTY DAWSON 06/05/21@3 :57 BY TW    Klebsiella aerogenes NOT DETECTED NOT DETECTED Final   Klebsiella oxytoca NOT DETECTED NOT DETECTED Final   Klebsiella pneumoniae NOT DETECTED NOT DETECTED Final   Proteus species NOT DETECTED NOT DETECTED Final   Salmonella species NOT DETECTED NOT DETECTED Final   Serratia marcescens NOT DETECTED NOT DETECTED Final   Haemophilus influenzae NOT DETECTED NOT DETECTED Final   Neisseria meningitidis NOT DETECTED NOT DETECTED Final   Pseudomonas aeruginosa NOT DETECTED NOT DETECTED  Final   Stenotrophomonas maltophilia NOT DETECTED NOT DETECTED Final   Candida albicans NOT DETECTED NOT DETECTED Final   Candida auris NOT DETECTED NOT DETECTED Final   Candida glabrata NOT DETECTED NOT DETECTED Final   Candida  krusei NOT DETECTED NOT DETECTED Final   Candida parapsilosis NOT DETECTED NOT DETECTED Final   Candida tropicalis NOT DETECTED NOT DETECTED Final   Cryptococcus neoformans/gattii NOT DETECTED NOT DETECTED Final   CTX-M ESBL NOT DETECTED NOT DETECTED Final   Carbapenem resistance IMP NOT DETECTED NOT DETECTED Final   Carbapenem resistance KPC NOT DETECTED NOT DETECTED Final   Carbapenem resistance NDM NOT DETECTED NOT DETECTED Final   Carbapenem resist OXA 48 LIKE NOT DETECTED NOT DETECTED Final   Carbapenem resistance VIM NOT DETECTED NOT DETECTED Final    Comment: Performed at [facility], [address] [place], [place] [address]  Blood Culture (routine x 2)     Status: None (Preliminary result)   Collection Time: 06/04/21 12:40 PM   Specimen: BLOOD RIGHT FOREARM  Result Value Ref Range Status   Specimen Description   Final    BLOOD RIGHT FOREARM BOTTLES DRAWN AEROBIC AND ANAEROBIC Performed at [facility], [address], [place], [place] [address]    Special Requests   Final    Blood Culture adequate volume Performed at [facility], [address] [place], [place] [address]    Culture  Setup Time   Final    ANAEROBIC BOTTLE ONLY GRAM NEGATIVE RODS Gram Stain Report Called to,Read Back By and Verified With: SPENCE,H@0335  BY MATTHEWS, B 9.13.22 Performed at [facility], [address], [place], [place] [address]    Culture GRAM NEGATIVE RODS  Final   Report Status PENDING  Incomplete  Urine Culture     Status: Abnormal (Preliminary result)   Collection Time: 06/04/21  2:00 PM   Specimen: In/Out Cath Urine  Result Value Ref Range Status   Specimen Description   Final    IN/OUT CATH URINE Performed at [facility], [address], [place], [place] [address]    Special Requests   Final    NONE Performed at [facility], [address] [place], [place] [address]    Culture (A)  Final    >=100,000 COLONIES/mL ESCHERICHIA COLI SUSCEPTIBILITIES TO FOLLOW Performed at  [facility], [place] [address], [place], [place] [address]    Report Status PENDING  Incomplete  Culture, blood (routine x 2)     Status: None (Preliminary result)   Collection Time: 06/05/21 12:43 PM   Specimen: BLOOD LEFT HAND  Result Value Ref Range Status   Specimen Description BLOOD LEFT HAND  Final   Special Requests   Final    BOTTLES DRAWN AEROBIC AND ANAEROBIC Blood Culture adequate volume   Culture   Final    NO GROWTH < 24 HOURS Performed at [facility], [address], [place], [place] [address]    Report Status PENDING  Incomplete  Culture, blood (routine x 2)     Status: None (Preliminary result)   Collection Time: 06/05/21 12:43 PM   Specimen: BLOOD RIGHT HAND  Result Value Ref Range Status   Specimen Description BLOOD RIGHT HAND  Final   Special Requests   Final    BOTTLES DRAWN AEROBIC AND ANAEROBIC Blood Culture results may not be optimal due to an excessive volume of blood received in culture bottles   Culture   Final    NO GROWTH < 24  HOURS Performed at [facility], [address], [place], [place] [address]    Report Status PENDING  Incomplete  MRSA Next Gen by PCR, Nasal     Status: None   Collection Time: 06/05/21  4:07 PM   Specimen: Nasal Mucosa; Nasal Swab  Result Value Ref Range Status   MRSA by PCR Next Gen NOT DETECTED NOT DETECTED Final    Comment: (NOTE) The GeneXpert MRSA Assay (FDA approved for NASAL specimens only), is one component of a comprehensive MRSA colonization surveillance program. It is not intended to diagnose MRSA infection nor to guide or monitor treatment for MRSA infections. Test performance is not FDA approved in patients less than 41 years old. Performed at [facility], [address] [place], [place] [address]     Radiology Studies: CT HEAD WO CONTRAST (5MM)  Result Date: 06/04/2021 CLINICAL DATA:  Dizziness and weakness today. Recent COVID-19 infection. Cerebral hemorrhage suspected. EXAM: CT HEAD WITHOUT CONTRAST  TECHNIQUE: Contiguous axial images were obtained from the base of the skull through the vertex without intravenous contrast. COMPARISON:  CT head 05/05/2021 and 02/17/2018. FINDINGS: Brain: There is no evidence of acute intracranial hemorrhage, mass lesion, brain edema or extra-axial fluid collection. Atrophy with prominence of the ventricles and subarachnoid spaces. There is stable mild low-density in the periventricular white matter, most consistent with chronic small vessel ischemic change. There is no CT evidence of acute cortical infarction. Vascular: Mild intracranial atherosclerosis. No hyperdense vessel identified. Skull: Negative for fracture or focal lesion. Sinuses/Orbits: New mucosal thickening and partial opacification of the bilateral ethmoid, right frontal and right maxillary sinuses without air-fluid levels. The mastoid air cells and middle ears are clear. Stable postsurgical changes in both orbits. Other: None. IMPRESSION: 1. Stable appearance of the brain without acute intracranial findings. 2. New paranasal sinus mucosal thickening and partial opacification consistent with sinusitis. Electronically Signed   By: Richardean Sale M.D.   On: 06/04/2021 14:40   DG CHEST PORT 1 VIEW  Result Date: 06/04/2021 CLINICAL DATA:  Acute hypoxic respiratory failure. COVID-19 infection. History of diabetes. EXAM: PORTABLE CHEST 1 VIEW COMPARISON:  Radiographs 06/04/2021 and 05/19/2021.  CT 05/19/2021. FINDINGS: 2007 hours. Lower lung volumes with mild patient rotation to the left. The heart size and mediastinal contours are stable with aortic atherosclerosis. There are lower lung volumes with mildly increased patchy opacities at both lung bases, probably atelectasis. No confluent airspace opacity or typical ground-glass opacities of viral infection. No pleural effusion or pneumothorax. The bones appear unchanged status post lower cervical fusion. IMPRESSION: Lower lung volumes with probable mildly increased  bibasilar atelectasis. No consolidation or typical findings of viral pneumonia. Electronically Signed   By: Richardean Sale M.D.   On: 06/04/2021 20:27   DG Chest Port 1 View  Result Date: 06/04/2021 CLINICAL DATA:  Questionable sepsis EXAM: PORTABLE CHEST 1 VIEW COMPARISON:  05/19/2021 FINDINGS: The heart size and mediastinal contours are within normal limits. Mild bibasilar atelectasis is again noted. No pleural effusion or pneumothorax. The visualized skeletal structures are unremarkable. IMPRESSION: Mild bibasilar atelectasis. Electronically Signed   By: Merilyn Baba M.D.   On: 06/04/2021 13:17    Scheduled Meds:  chlorhexidine  15 mL Mouth Rinse BID   Chlorhexidine Gluconate Cloth  6 each Topical Daily   dexamethasone (DECADRON) injection  6 mg Intravenous Q24H   insulin aspart  0-15 Units Subcutaneous Q4H   insulin glargine-yfgn  35 Units Subcutaneous BH-q7a   ipratropium  2 spray Each Nare BID  mouth rinse  15 mL Mouth Rinse q12n4p   mometasone-formoterol  2 puff Inhalation BID   [START ON 06/07/2021] pantoprazole  40 mg Oral Q0600   Continuous Infusions:  cefTRIAXone (ROCEPHIN)  IV 2 g (06/05/21 2105)   lactated ringers 50 mL/hr at 06/06/21 1130     LOS: 1 day   Time spent: 35 minutes  Skyler Dusing Wynetta Emery, MD How to contact the [facility] Attending or Consulting provider [place] or covering provider during after hours [place], for this patient?  Check the care team in [facility] and look for a) attending/consulting [facility] provider listed and b) the [facility] team listed Log into [email or web address] and use [place]'s universal password to access. If you do not have the password, please contact the hospital operator. Locate the [facility] provider you are looking for under [facility] and page to a number that you can be directly reached. If you still have difficulty reaching the provider, please page the [facility] (Director on Call) for the Hospitalists listed on amion for assistance.   If 7PM-7AM, please  contact night-coverage [email or web address] 06/06/2021, 11:36 AM

## 2021-06-06 NOTE — Progress Notes (Signed)
Patient has been on RA most of the day.  Bipap is still at bedside if needed.

## 2021-06-07 DIAGNOSIS — N179 Acute kidney failure, unspecified: Secondary | ICD-10-CM | POA: Diagnosis not present

## 2021-06-07 DIAGNOSIS — N1832 Chronic kidney disease, stage 3b: Secondary | ICD-10-CM | POA: Diagnosis not present

## 2021-06-07 DIAGNOSIS — K219 Gastro-esophageal reflux disease without esophagitis: Secondary | ICD-10-CM | POA: Diagnosis not present

## 2021-06-07 DIAGNOSIS — U071 COVID-19: Secondary | ICD-10-CM | POA: Diagnosis not present

## 2021-06-07 LAB — GLUCOSE, CAPILLARY
Glucose-Capillary: 54 mg/dL — ABNORMAL LOW (ref 70–99)
Glucose-Capillary: 87 mg/dL (ref 70–99)
Glucose-Capillary: 94 mg/dL (ref 70–99)
Glucose-Capillary: 101 mg/dL — ABNORMAL HIGH (ref 70–99)
Glucose-Capillary: 175 mg/dL — ABNORMAL HIGH (ref 70–99)

## 2021-06-07 LAB — CULTURE, BLOOD (ROUTINE X 2)
Special Requests: ADEQUATE
Special Requests: ADEQUATE

## 2021-06-07 LAB — CBC WITH DIFFERENTIAL/PLATELET
Abs Immature Granulocytes: 0.03 10*3/uL (ref 0.00–0.07)
Basophils Absolute: 0 10*3/uL (ref 0.0–0.1)
Basophils Relative: 0 %
Eosinophils Absolute: 0 10*3/uL (ref 0.0–0.5)
Eosinophils Relative: 0 %
HCT: 41.8 % (ref 39.0–52.0)
Hemoglobin: 13.5 g/dL (ref 13.0–17.0)
Immature Granulocytes: 0 %
Lymphocytes Relative: 7 %
Lymphs Abs: 0.5 10*3/uL — ABNORMAL LOW (ref 0.7–4.0)
MCH: 29.9 pg (ref 26.0–34.0)
MCHC: 32.3 g/dL (ref 30.0–36.0)
MCV: 92.7 fL (ref 80.0–100.0)
Monocytes Absolute: 0.4 10*3/uL (ref 0.1–1.0)
Monocytes Relative: 4 %
Neutro Abs: 7.3 10*3/uL (ref 1.7–7.7)
Neutrophils Relative %: 89 %
Platelets: 91 10*3/uL — ABNORMAL LOW (ref 150–400)
RBC: 4.51 MIL/uL (ref 4.22–5.81)
RDW: 16.1 % — ABNORMAL HIGH (ref 11.5–15.5)
WBC: 8.2 10*3/uL (ref 4.0–10.5)
nRBC: 0 % (ref 0.0–0.2)

## 2021-06-07 LAB — URINE CULTURE: Culture: >=100000 — AB

## 2021-06-07 LAB — MAGNESIUM: Magnesium: 1.7 mg/dL (ref 1.7–2.4)

## 2021-06-07 LAB — BASIC METABOLIC PANEL
Anion gap: 6 (ref 5–15)
BUN: 60 mg/dL — ABNORMAL HIGH (ref 8–23)
CO2: 25 mmol/L (ref 22–32)
Calcium: 7.6 mg/dL — ABNORMAL LOW (ref 8.9–10.3)
Chloride: 110 mmol/L (ref 98–111)
Creatinine, Ser: 1.73 mg/dL — ABNORMAL HIGH (ref 0.61–1.24)
GFR, Estimated: 39 mL/min — ABNORMAL LOW (ref >60)
Glucose, Bld: 84 mg/dL (ref 70–99)
Potassium: 3.5 mmol/L (ref 3.5–5.1)
Sodium: 141 mmol/L (ref 135–145)

## 2021-06-07 MED ORDER — INSULIN ASPART 100 UNIT/ML IJ SOLN
0.0000 [IU] | Freq: Three times a day (TID) | INTRAMUSCULAR | Status: DC
  Administered 2021-06-08 (×2): 3 [IU] via SUBCUTANEOUS
  Administered 2021-06-08: 2 [IU] via SUBCUTANEOUS
  Administered 2021-06-10 (×2): 3 [IU] via SUBCUTANEOUS
  Administered 2021-06-10 – 2021-06-11 (×3): 2 [IU] via SUBCUTANEOUS
  Administered 2021-06-11 – 2021-06-12 (×3): 5 [IU] via SUBCUTANEOUS

## 2021-06-07 MED ORDER — INSULIN GLARGINE-YFGN 100 UNIT/ML ~~LOC~~ SOLN
25.0000 [IU] | SUBCUTANEOUS | Status: DC
  Administered 2021-06-08 – 2021-06-10 (×2): 25 [IU] via SUBCUTANEOUS
  Filled 2021-06-07 (×5): qty 0.25

## 2021-06-07 MED ORDER — TAMSULOSIN HCL 0.4 MG PO CAPS
0.4000 mg | ORAL_CAPSULE | Freq: Every day | ORAL | Status: DC
  Administered 2021-06-07 – 2021-06-10 (×3): 0.4 mg via ORAL
  Filled 2021-06-07 (×4): qty 1

## 2021-06-07 NOTE — Progress Notes (Signed)
Inpatient Diabetes Program Recommendations  AACE/ADA: New Consensus Statement on Inpatient Glycemic Control  Target Ranges:  Prepandial:   less than 140 mg/dL      Peak postprandial:   less than 180 mg/dL (1-2 hours)      Critically ill patients:  140 - 180 mg/dL   Results for [name], [name]" (MRN [id number]) as of 06/07/2021 09:50  Ref. Range 06/07/2021 07:41 06/07/2021 08:07  Glucose-Capillary Latest Ref Range: 70 - 99 mg/dL 54 (L) 87   Results for Scronce, [name] (MRN [id number]) as of 06/07/2021 09:50  Ref. Range 06/06/2021 07:51 06/06/2021 11:04 06/06/2021 16:31 06/06/2021 21:27  Glucose-Capillary Latest Ref Range: 70 - 99 mg/dL 165 (H) 200 (H) 233 (H) 207 (H)   Review of Glycemic Control  Diabetes history: DM2 Outpatient Diabetes medications:  Current orders for Inpatient glycemic control: Semglee 35 units QAM, Novolog 0-15 units TID with meals; Decadron 6 mg Q24H  Inpatient Diabetes Program Recommendations:    Insulin: Fasting glucose 54 mg/dl today. Please consider decreasing Semglee to 30 units QAM.  Thanks, Barnie Alderman, RN, MSN, CDE Diabetes Coordinator Inpatient Diabetes Program [phone] (Team Pager from 8am to 5pm)

## 2021-06-07 NOTE — Progress Notes (Signed)
Patient stable at this time on RA.  No Bipap needed at this time.

## 2021-06-07 NOTE — Progress Notes (Signed)
PROGRESS NOTE    [name]  CNO:[id number] DOB: 05-Nov-1934 DOA: 06/04/2021 PCP: Celene Squibb, MD   Brief Narrative:   [name] is a 86 y.o. male with medical history significant of CAD, HLD, DM, HTN, CKD, B12 deficiency, mantle cell lymphoma of the colon, GERD, PVD, prostate CA, OSA not on CPAP who was seen in the ED on 827 with a positive COVID test.  Per the wife he took medication and felt some better from his COVID.  This morning when he was home he was found to be mildly hypoglycemic per his wife seemed mildly confused.  CT head negative and patient had low-grade fever on admission and urine analysis suggesting UTI.  He was empirically started on cefepime and vancomycin. He is currently on bipap due to some interval development of hypoxemia and respiratory distress.  Assessment & Plan:   Principal Problem:   Acute kidney injury ([place]) Active Problems:   Mixed hyperlipidemia   Hypertension   Coronary atherosclerosis of native coronary artery   GERD   Mantle cell lymphoma (HCC)   Prostate cancer (HCC)   Thrombocytopenia, unspecified (HCC)   B12 deficiency   Chronic kidney disease, stage 3b (HCC)   Mild cognitive impairment   Type 2 diabetes mellitus ([place])   COVID-19 virus infection   Severe Sepsis (POA) secondary to E. coli bacteremia related to UTI with noted AKI -Continue on current antibiotic treatment with Rocephin -Continue IV fluid and monitor repeat labs -Urine culture with findings of E coli infection  -Repeat blood cultures no growth to date  Acute metabolic encephalopathy secondary to above in the setting of mild cognitive impairment -Discontinue sedating agents such as gabapentin and temazepam -Ativan only as needed for agitation -CT head with no acute findings  Acute urinary retention  - Foley placed  - anticipate DC foley and voiding trial on 9/16 - If fails, would dc home with foley catheter  - flomax ordered.   AKI on CKD stage IIIb -  stable to improved -Likely prerenal in the setting of sepsis -Avoid nephrotoxic agents -reducing IV fluid hydration -Monitor strict I's and O's  Acute hypoxemic respiratory failure in the setting of COVID-19 infection -Wean Solu-Medrol to Decadron daily for now and monitor inflammatory markers -Avoid remdesivir as patient has completed course of Paxlovid -Currently on BiPAP,  plan to wean to room air today.  -Chest x-ray with no acute findings  Thrombocytopenia-acute on chronic -Likely secondary to sepsis -Continue monitor with repeat CBC and discontinue aspirin and Lovenox  Type 2 diabetes with hypoglycemia  -A1c 9.2% -SSI adjusted to every 4 dosing -Hold home glipizide -Decreased Lantus insulin further due to low blood glucose  CBG (last 3)  Recent Labs    06/07/21 0807 06/07/21 1128 06/07/21 1635  GLUCAP 87 94 101*    Hypertension - better controlled -resume home BP medications -IV as needed labetalol  History of CAD -resume home statin and aspirin  History of mantle cell lymphoma -Has not seen hematology since 2019   DVT prophylaxis:Lovenox to SCDs Code Status: Full Family Communication: daughter/son telephone 9/15 Disposition Plan: TBD Status is: Inpatient  Remains inpatient appropriate because:Hemodynamically unstable, Altered mental status, IV treatments appropriate due to intensity of illness or inability to take PO, and Inpatient level of care appropriate due to severity of illness  Dispo: The patient is from: Home              Anticipated d/c is to: Home  Patient currently is not medically stable to d/c.   Difficult to place patient No   Consultants:  None  Procedures:  See below  Antimicrobials:  Anti-infectives (From admission, onward)    Start     Dose/Rate Route Frequency Ordered Stop   06/06/21 1400  vancomycin (VANCOREADY) IVPB 1750 mg/350 mL  Status:  Discontinued        1,750 mg 175 mL/hr over 120 Minutes Intravenous  Every 48 hours 06/04/21 1408 06/04/21 1745   06/05/21 2200  cefTRIAXone (ROCEPHIN) 2 g in sodium chloride 0.9 % 100 mL IVPB        2 g 200 mL/hr over 30 Minutes Intravenous Every 24 hours 06/05/21 0914     06/05/21 1000  ceFEPIme (MAXIPIME) 2 g in sodium chloride 0.9 % 100 mL IVPB  Status:  Discontinued        2 g 200 mL/hr over 30 Minutes Intravenous Every 24 hours 06/04/21 1408 06/04/21 1859   06/05/21 0800  ceFEPIme (MAXIPIME) 2 g in sodium chloride 0.9 % 100 mL IVPB  Status:  Discontinued        2 g 200 mL/hr over 30 Minutes Intravenous Every 24 hours 06/05/21 0519 06/05/21 0913   06/04/21 2130  cefTRIAXone (ROCEPHIN) 2 g in sodium chloride 0.9 % 100 mL IVPB        2 g 200 mL/hr over 30 Minutes Intravenous  Once 06/04/21 2117 06/04/21 2218   06/04/21 1300  vancomycin (VANCOREADY) IVPB 2000 mg/400 mL        2,000 mg 200 mL/hr over 120 Minutes Intravenous  Once 06/04/21 1236 06/04/21 1729   06/04/21 1230  ceFEPIme (MAXIPIME) 2 g in sodium chloride 0.9 % 100 mL IVPB        2 g 200 mL/hr over 30 Minutes Intravenous  Once 06/04/21 1223 06/04/21 1341   06/04/21 1230  metroNIDAZOLE (FLAGYL) IVPB 500 mg        500 mg 100 mL/hr over 60 Minutes Intravenous  Once 06/04/21 1223 06/04/21 1516   06/04/21 1230  vancomycin (VANCOCIN) IVPB 1000 mg/200 mL premix  Status:  Discontinued        1,000 mg 200 mL/hr over 60 Minutes Intravenous  Once 06/04/21 1223 06/04/21 1236       Subjective: Patient doesn't like having the foley catheter in place but not trying to remove it.     Objective: Vitals:   06/06/21 2140 06/07/21 0742 06/07/21 0800 06/07/21 1312  BP: 136/81  (!) 159/95 135/88  Pulse: 91  75 75  Resp:   16 18  Temp: 98.4 F (36.9 C) (!) 97.5 F (36.4 C) 97.7 F (36.5 C) 97.6 F (36.4 C)  TempSrc: Oral Oral Oral Oral  SpO2: 94%  100% 100%  Weight:      Height:        Intake/Output Summary (Last 24 hours) at 06/07/2021 1636 Last data filed at 06/07/2021 0900 Gross per 24 hour   Intake 709.4 ml  Output 2675 ml  Net -1965.6 ml   Filed Weights   06/04/21 1111 06/05/21 1600 06/06/21 0400  Weight: 100 kg 101.9 kg 102.4 kg    Examination:  General exam: awake, alert, confused, NAD, cooperative.  Respiratory system: speaking full sentences while on bipap, no increased work of breathing. Cardiovascular system: normal S1 & S2 heard.   Gastrointestinal system: Abdomen is soft, ND/NT, no HSM.  Central nervous system: nonfocal.  Extremities: no C/C/E Skin: no gross lesions seen.  Psychiatry: confused at times,  normal affect.    Data Reviewed: I have personally reviewed following labs and imaging studies  CBC: Recent Labs  Lab 06/04/21 1239 06/05/21 0705 06/06/21 0444 06/06/21 2143 06/07/21 0438  WBC 6.0 7.3 8.1  --  8.2  NEUTROABS 5.0 6.5  --   --  7.3  HGB 15.7 13.8 14.1 14.0 13.5  HCT 48.1 43.8 44.2 43.5 41.8  MCV 92.0 94.2 93.4  --  92.7  PLT 64* 55* 67*  --  91*   Basic Metabolic Panel: Recent Labs  Lab 06/04/21 1239 06/05/21 0705 06/06/21 0444 06/07/21 0438  NA 134* 139 143 141  K 4.4 4.4 3.9 3.5  CL 105 107 108 110  CO2 19* 23 26 25   GLUCOSE 275* 306* 196* 84  BUN 46* 48* 55* 60*  CREATININE 2.07* 2.21* 1.95* 1.73*  CALCIUM 8.0* 7.5* 8.0* 7.6*  MG  --   --  1.8 1.7   GFR: Estimated Creatinine Clearance: 39.4 mL/min (A) (by C-G formula based on SCr of 1.73 mg/dL (H)). Liver Function Tests: Recent Labs  Lab 06/04/21 1239 06/05/21 0705  AST 21 23  ALT 19 17  ALKPHOS 55 43  BILITOT 2.3* 1.1  PROT 6.8 5.3*  ALBUMIN 3.9 2.8*   No results for input(s): LIPASE, AMYLASE in the last 168 hours. No results for input(s): AMMONIA in the last 168 hours. Coagulation Profile: Recent Labs  Lab 06/04/21 1239  INR 1.1   Cardiac Enzymes: No results for input(s): CKTOTAL, CKMB, CKMBINDEX, TROPONINI in the last 168 hours. BNP (last 3 results) No results for input(s): PROBNP in the last 8760 hours. HbA1C: No results for input(s): HGBA1C  in the last 72 hours.  CBG: Recent Labs  Lab 06/06/21 1631 06/06/21 2127 06/07/21 0741 06/07/21 0807 06/07/21 1128  GLUCAP 233* 207* 54* 87 94   Lipid Profile: No results for input(s): CHOL, HDL, LDLCALC, TRIG, CHOLHDL, LDLDIRECT in the last 72 hours. Thyroid Function Tests: No results for input(s): TSH, T4TOTAL, FREET4, T3FREE, THYROIDAB in the last 72 hours.  Anemia Panel: No results for input(s): VITAMINB12, FOLATE, FERRITIN, TIBC, IRON, RETICCTPCT in the last 72 hours. Sepsis Labs: Recent Labs  Lab 06/04/21 2132 06/04/21 2315 06/05/21 0458 06/05/21 0705 06/05/21 0915  PROCALCITON  --   --  6.72  --   --   LATICACIDVEN 1.5 1.5  --  1.5 1.0    Recent Results (from the past 240 hour(s))  Resp Panel by RT-PCR (Flu A&B, Covid) Nasopharyngeal Swab     Status: Abnormal   Collection Time: 06/04/21 12:23 PM   Specimen: Nasopharyngeal Swab; Nasopharyngeal(NP) swabs in vial transport medium  Result Value Ref Range Status   SARS Coronavirus 2 by RT PCR POSITIVE (A) NEGATIVE Final    Comment: CRITICAL RESULT CALLED TO, READ BACK BY AND VERIFIED WITH: CRAWFORD,H AT 1344 ON 9.12.22 BY RUCINSKI, B (NOTE) SARS-CoV-2 target nucleic acids are DETECTED.  The SARS-CoV-2 RNA is generally detectable in upper respiratory specimens during the acute phase of infection. Positive results are indicative of the presence of the identified virus, but do not rule out bacterial infection or co-infection with other pathogens not detected by the test. Clinical correlation with patient history and other diagnostic information is necessary to determine patient infection status. The expected result is Negative.  Fact Sheet for Patients: EntrepreneurPulse.com.au  Fact Sheet for Healthcare Providers: IncredibleEmployment.be  This test is not yet approved or cleared by the [place] FDA and  has been authorized for detection and/or diagnosis  of SARS-CoV-2  by FDA under an Emergency Use Authorization (EUA).  This EUA will remain in effect (mean ing this test can be used) for the duration of  the COVID-19 declaration under Section 564(b)(1) of the Act, 21 U.S.C. section 360bbb-3(b)(1), unless the authorization is terminated or revoked sooner.     Influenza A by PCR NEGATIVE NEGATIVE Final   Influenza B by PCR NEGATIVE NEGATIVE Final    Comment: (NOTE) The Xpert Xpress SARS-CoV-2/FLU/RSV plus assay is intended as an aid in the diagnosis of influenza from Nasopharyngeal swab specimens and should not be used as a sole basis for treatment. Nasal washings and aspirates are unacceptable for Xpert Xpress SARS-CoV-2/FLU/RSV testing.  Fact Sheet for Patients: EntrepreneurPulse.com.au  Fact Sheet for Healthcare Providers: IncredibleEmployment.be  This test is not yet approved or cleared by the [place] FDA and has been authorized for detection and/or diagnosis of SARS-CoV-2 by FDA under an Emergency Use Authorization (EUA). This EUA will remain in effect (meaning this test can be used) for the duration of the COVID-19 declaration under Section 564(b)(1) of the Act, 21 U.S.C. section 360bbb-3(b)(1), unless the authorization is terminated or revoked.  Performed at [facility], [address], [place], [place] [address]   Blood Culture (routine x 2)     Status: Abnormal   Collection Time: 06/04/21 12:39 PM   Specimen: BLOOD LEFT HAND  Result Value Ref Range Status   Specimen Description   Final    BLOOD LEFT HAND BOTTLES DRAWN AEROBIC AND ANAEROBIC Performed at [facility], [address] [place], [place] [address]    Special Requests   Final    Blood Culture adequate volume Performed at [facility], [address], [place], [place] [address]    Culture  Setup Time   Final    ANAEROBIC BOTTLE ONLY GRAM NEGATIVE RODS Gram Stain Report Called to,Read Back By and Verified With: Belton,k@0044  by  Matthews,b 9.13.22 CRITICAL RESULT CALLED TO, READ BACK BY AND VERIFIED WITH: RN CHRISTY DAWSON 06/05/21@3 :57 BY TW  Performed at [facility], [place] [address], [place], [place] [address]    Culture ESCHERICHIA COLI (A)  Final   Report Status 06/07/2021 FINAL  Final   Organism ID, Bacteria ESCHERICHIA COLI  Final      Susceptibility   Escherichia coli - MIC*    AMPICILLIN >=32 RESISTANT Resistant     CEFAZOLIN <=4 SENSITIVE Sensitive     CEFEPIME <=0.12 SENSITIVE Sensitive     CEFTAZIDIME <=1 SENSITIVE Sensitive     CEFTRIAXONE <=0.25 SENSITIVE Sensitive     CIPROFLOXACIN <=0.25 SENSITIVE Sensitive     GENTAMICIN <=1 SENSITIVE Sensitive     IMIPENEM <=0.25 SENSITIVE Sensitive     TRIMETH/SULFA <=20 SENSITIVE Sensitive     AMPICILLIN/SULBACTAM >=32 RESISTANT Resistant     PIP/TAZO <=4 SENSITIVE Sensitive     * ESCHERICHIA COLI  Blood Culture ID Panel (Reflexed)     Status: Abnormal   Collection Time: 06/04/21 12:39 PM  Result Value Ref Range Status   Enterococcus faecalis NOT DETECTED NOT DETECTED Final   Enterococcus Faecium NOT DETECTED NOT DETECTED Final   Listeria monocytogenes NOT DETECTED NOT DETECTED Final   Staphylococcus species NOT DETECTED NOT DETECTED Final   Staphylococcus aureus (BCID) NOT DETECTED NOT DETECTED Final   Staphylococcus epidermidis NOT DETECTED NOT DETECTED Final   Staphylococcus lugdunensis NOT DETECTED NOT DETECTED Final   Streptococcus species NOT DETECTED NOT DETECTED Final   Streptococcus agalactiae NOT DETECTED NOT DETECTED  Final   Streptococcus pneumoniae NOT DETECTED NOT DETECTED Final   Streptococcus pyogenes NOT DETECTED NOT DETECTED Final   A.calcoaceticus-baumannii NOT DETECTED NOT DETECTED Final   Bacteroides fragilis NOT DETECTED NOT DETECTED Final   Enterobacterales DETECTED (A) NOT DETECTED Final    Comment: Enterobacterales represent a large order of gram negative bacteria, not a single organism. CRITICAL RESULT CALLED TO, READ  BACK BY AND VERIFIED WITH: RN CHRISTY DAWSON 06/05/21@3 :57 BY TW    Enterobacter cloacae complex NOT DETECTED NOT DETECTED Final   Escherichia coli DETECTED (A) NOT DETECTED Final    Comment: CRITICAL RESULT CALLED TO, READ BACK BY AND VERIFIED WITH: RN CHRISTY DAWSON 06/05/21@3 :57 BY TW    Klebsiella aerogenes NOT DETECTED NOT DETECTED Final   Klebsiella oxytoca NOT DETECTED NOT DETECTED Final   Klebsiella pneumoniae NOT DETECTED NOT DETECTED Final   Proteus species NOT DETECTED NOT DETECTED Final   Salmonella species NOT DETECTED NOT DETECTED Final   Serratia marcescens NOT DETECTED NOT DETECTED Final   Haemophilus influenzae NOT DETECTED NOT DETECTED Final   Neisseria meningitidis NOT DETECTED NOT DETECTED Final   Pseudomonas aeruginosa NOT DETECTED NOT DETECTED Final   Stenotrophomonas maltophilia NOT DETECTED NOT DETECTED Final   Candida albicans NOT DETECTED NOT DETECTED Final   Candida auris NOT DETECTED NOT DETECTED Final   Candida glabrata NOT DETECTED NOT DETECTED Final   Candida krusei NOT DETECTED NOT DETECTED Final   Candida parapsilosis NOT DETECTED NOT DETECTED Final   Candida tropicalis NOT DETECTED NOT DETECTED Final   Cryptococcus neoformans/gattii NOT DETECTED NOT DETECTED Final   CTX-M ESBL NOT DETECTED NOT DETECTED Final   Carbapenem resistance IMP NOT DETECTED NOT DETECTED Final   Carbapenem resistance KPC NOT DETECTED NOT DETECTED Final   Carbapenem resistance NDM NOT DETECTED NOT DETECTED Final   Carbapenem resist OXA 48 LIKE NOT DETECTED NOT DETECTED Final   Carbapenem resistance VIM NOT DETECTED NOT DETECTED Final    Comment: Performed at [facility], [address], [place], [place] [address]  Blood Culture (routine x 2)     Status: Abnormal   Collection Time: 06/04/21 12:40 PM   Specimen: BLOOD RIGHT FOREARM  Result Value Ref Range Status   Specimen Description   Final    BLOOD RIGHT FOREARM BOTTLES DRAWN AEROBIC AND ANAEROBIC Performed at [facility], [address] [place], [place] [address]    Special Requests   Final    Blood Culture adequate volume Performed at [facility], [address], [place], [place] [address]    Culture  Setup Time   Final    ANAEROBIC BOTTLE ONLY GRAM NEGATIVE RODS Gram Stain Report Called to,Read Back By and Verified With: SPENCE,[email or web address]  BY MATTHEWS, B 9.13.22 Performed at [facility], [address], [place], [name] [address]    Culture (A)  Final    ESCHERICHIA COLI SUSCEPTIBILITIES PERFORMED ON PREVIOUS CULTURE WITHIN THE LAST 5 DAYS. Performed at [facility], [place] [address], [place], [place] [address]    Report Status 06/07/2021 FINAL  Final  Urine Culture     Status: Abnormal   Collection Time: 06/04/21  2:00 PM   Specimen: In/Out Cath Urine  Result Value Ref Range Status   Specimen Description   Final    IN/OUT CATH URINE Performed at [facility], [address], [place], [place] [address]    Special Requests   Final    NONE Performed at [facility], [address] [place],  [place] [address]    Culture >=100,000 COLONIES/mL ESCHERICHIA COLI (A)  Final   Report Status 06/07/2021 FINAL  Final   Organism ID, Bacteria ESCHERICHIA COLI (A)  Final      Susceptibility   Escherichia coli - MIC*    AMPICILLIN >=32 RESISTANT Resistant     CEFAZOLIN <=4 SENSITIVE Sensitive     CEFEPIME <=0.12 SENSITIVE Sensitive     CEFTRIAXONE <=0.25 SENSITIVE Sensitive     CIPROFLOXACIN <=0.25 SENSITIVE Sensitive     GENTAMICIN <=1 SENSITIVE Sensitive     IMIPENEM <=0.25 SENSITIVE Sensitive     NITROFURANTOIN <=16 SENSITIVE Sensitive     TRIMETH/SULFA <=20 SENSITIVE Sensitive     AMPICILLIN/SULBACTAM >=32 RESISTANT Resistant     PIP/TAZO <=4 SENSITIVE Sensitive     * >=100,000 COLONIES/mL ESCHERICHIA COLI  Culture, blood (routine x 2)     Status: None (Preliminary result)   Collection Time: 06/05/21 12:43 PM   Specimen: BLOOD LEFT HAND  Result Value Ref Range Status   Specimen  Description BLOOD LEFT HAND  Final   Special Requests   Final    BOTTLES DRAWN AEROBIC AND ANAEROBIC Blood Culture adequate volume   Culture   Final    NO GROWTH 2 DAYS Performed at [facility], [address], [place], [place] [address]    Report Status PENDING  Incomplete  Culture, blood (routine x 2)     Status: None (Preliminary result)   Collection Time: 06/05/21 12:43 PM   Specimen: BLOOD RIGHT HAND  Result Value Ref Range Status   Specimen Description BLOOD RIGHT HAND  Final   Special Requests   Final    BOTTLES DRAWN AEROBIC AND ANAEROBIC Blood Culture results may not be optimal due to an excessive volume of blood received in culture bottles   Culture   Final    NO GROWTH 2 DAYS Performed at [facility], [address] [place], [place] [address]    Report Status PENDING  Incomplete  MRSA Next Gen by PCR, Nasal     Status: None   Collection Time: 06/05/21  4:07 PM   Specimen: Nasal Mucosa; Nasal Swab  Result Value Ref Range Status   MRSA by PCR Next Gen NOT DETECTED NOT DETECTED Final    Comment: (NOTE) The GeneXpert MRSA Assay (FDA approved for NASAL specimens only), is one component of a comprehensive MRSA colonization surveillance program. It is not intended to diagnose MRSA infection nor to guide or monitor treatment for MRSA infections. Test performance is not FDA approved in patients less than 36 years old. Performed at [facility], [address], [place],  [address]     Radiology Studies: No results found.  Scheduled Meds:  amLODipine  5 mg Oral Daily   aspirin EC  81 mg Oral Daily   atorvastatin  20 mg Oral Daily   chlorhexidine  15 mL Mouth Rinse BID   Chlorhexidine Gluconate Cloth  6 each Topical Daily   dexamethasone (DECADRON) injection  6 mg Intravenous Q24H   insulin aspart  0-15 Units Subcutaneous TID WC   [START ON 06/08/2021] insulin glargine-yfgn  25 Units Subcutaneous [id number]   ipratropium  2 spray Each Nare BID   mouth rinse  15 mL  Mouth Rinse q12n4p   mometasone-formoterol  2 puff Inhalation BID   pantoprazole  40 mg Oral Q0600   propranolol ER  80 mg Oral QHS   Continuous Infusions:  cefTRIAXone (ROCEPHIN)  IV 2 g (06/06/21 2146)   lactated  ringers 50 mL/hr at 06/07/21 0757     LOS: 2 days   Time spent: 35 minutes  Mauro Arps Wynetta Emery, MD How to contact the [facility] Attending or Consulting provider [place] or covering provider during after hours [place], for this patient?  Check the care team in [facility] and look for a) attending/consulting [facility] provider listed and b) the [facility] team listed Log into [email or web address] and use [place] universal password to access. If you do not have the password, please contact the hospital operator. Locate the [facility] provider you are looking for under [facility] and page to a number that you can be directly reached. If you still have difficulty reaching the provider, please page the [facility] (Director on Call) for the Hospitalists listed on amion for assistance.   If 7PM-7AM, please contact night-coverage [email or web address] 06/07/2021, 4:36 PM

## 2021-06-08 ENCOUNTER — Encounter (HOSPITAL_COMMUNITY): Payer: Self-pay | Admitting: Internal Medicine

## 2021-06-08 DIAGNOSIS — U071 COVID-19: Secondary | ICD-10-CM | POA: Diagnosis not present

## 2021-06-08 DIAGNOSIS — N179 Acute kidney failure, unspecified: Secondary | ICD-10-CM | POA: Diagnosis not present

## 2021-06-08 DIAGNOSIS — K219 Gastro-esophageal reflux disease without esophagitis: Secondary | ICD-10-CM | POA: Diagnosis not present

## 2021-06-08 DIAGNOSIS — N1832 Chronic kidney disease, stage 3b: Secondary | ICD-10-CM | POA: Diagnosis not present

## 2021-06-08 LAB — CBC
HCT: 46.3 % (ref 39.0–52.0)
Hemoglobin: 15.3 g/dL (ref 13.0–17.0)
MCH: 30.5 pg (ref 26.0–34.0)
MCHC: 33 g/dL (ref 30.0–36.0)
MCV: 92.2 fL (ref 80.0–100.0)
Platelets: 122 10*3/uL — ABNORMAL LOW (ref 150–400)
RBC: 5.02 MIL/uL (ref 4.22–5.81)
RDW: 16 % — ABNORMAL HIGH (ref 11.5–15.5)
WBC: 6 10*3/uL (ref 4.0–10.5)
nRBC: 0 % (ref 0.0–0.2)

## 2021-06-08 LAB — BASIC METABOLIC PANEL
Anion gap: 10 (ref 5–15)
BUN: 55 mg/dL — ABNORMAL HIGH (ref 8–23)
CO2: 24 mmol/L (ref 22–32)
Calcium: 8.1 mg/dL — ABNORMAL LOW (ref 8.9–10.3)
Chloride: 106 mmol/L (ref 98–111)
Creatinine, Ser: 1.72 mg/dL — ABNORMAL HIGH (ref 0.61–1.24)
GFR, Estimated: 39 mL/min — ABNORMAL LOW (ref >60)
Glucose, Bld: 191 mg/dL — ABNORMAL HIGH (ref 70–99)
Potassium: 3.9 mmol/L (ref 3.5–5.1)
Sodium: 140 mmol/L (ref 135–145)

## 2021-06-08 LAB — GLUCOSE, CAPILLARY
Glucose-Capillary: 168 mg/dL — ABNORMAL HIGH (ref 70–99)
Glucose-Capillary: 189 mg/dL — ABNORMAL HIGH (ref 70–99)
Glucose-Capillary: 169 mg/dL — ABNORMAL HIGH (ref 70–99)
Glucose-Capillary: 133 mg/dL — ABNORMAL HIGH (ref 70–99)
Glucose-Capillary: 62 mg/dL — ABNORMAL LOW (ref 70–99)
Glucose-Capillary: 75 mg/dL (ref 70–99)

## 2021-06-08 MED ORDER — PROPRANOLOL HCL ER 60 MG PO CP24: 60.0000 mg | ORAL_CAPSULE | Freq: Every day | ORAL | 0 refills | Status: DC

## 2021-06-08 MED ORDER — OXYBUTYNIN CHLORIDE 5 MG PO TABS
5.0000 mg | ORAL_TABLET | Freq: Three times a day (TID) | ORAL | Status: DC | PRN
  Administered 2021-06-08: 5 mg via ORAL
  Filled 2021-06-08: qty 1

## 2021-06-08 MED ORDER — TOUJEO SOLOSTAR 300 UNIT/ML ~~LOC~~ SOPN: 25.0000 [IU] | PEN_INJECTOR | SUBCUTANEOUS | Status: DC

## 2021-06-08 MED ORDER — TAMSULOSIN HCL 0.4 MG PO CAPS: 0.4000 mg | ORAL_CAPSULE | Freq: Every day | ORAL | 0 refills | Status: AC

## 2021-06-08 MED ORDER — CEPHALEXIN 500 MG PO CAPS: 500.0000 mg | ORAL_CAPSULE | Freq: Three times a day (TID) | ORAL | 0 refills | Status: AC

## 2021-06-08 NOTE — H&P (View-Only) (Signed)
H&P Physician requesting consult: Brandon Brakeman, MD  Chief Complaint: Urinary retention, penile edema following catheter placement with an artificial sphincter  History of Present Illness: 86 year old male with history of prostate cancer status post:   Hx of RALP 02/24/08 ([place]) with EXE/PNI/+SM and adjuvant XRT/ADT until 08/26/08 for prostate CA Male sling and LGX IPP 06/11/10 AUS 01/23/17 Hx of penile urethral stricture, previously dilated  He was COVID-positive on 8/27.  He was admitted to the hospital on 9/12 after presenting with confusion and he was admitted for UTI and started on cefepime and vancomycin.  Foley catheter was placed at that time.  Since then, he has had several removals and insertions.  Today he was noted to have penile edema and bruising.  He is currently voiding clear yellow urine and is incontinent.  He is a poor historian.  He has both an artificial sphincter and a penile implant.  Urine culture was positive for E. coli resistant to ampicillin, otherwise sensitive.  He is on ceftriaxone.  Past Medical History:  Diagnosis Date   B12 deficiency 02/07/2014   Colon cancer 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, anomalous circumflex - no PCIs   Essential hypertension, benign    GERD (gastroesophageal reflux disease)    Mixed hyperlipidemia    Obstructive sleep apnea    does not use   Prostate cancer ([place])    PVD (peripheral vascular disease) ([place])    Type 2 diabetes mellitus ([place])    Past Surgical History:  Procedure Laterality Date   BACK SURGERY     BALLOON DILATION N/A 08/27/2013   Procedure: BALLOON DILATION;  Surgeon: Rogene Houston, MD;  [facility];  Service: Endoscopy;  Laterality: N/A;   COLON SURGERY     COLONOSCOPY N/A 12/17/2013   Procedure: COLONOSCOPY;  Surgeon: Rogene Houston, MD;  [facility];  Service: Endoscopy;  Laterality: N/A;  940   COLONOSCOPY N/A  02/28/2014   Procedure: COLONOSCOPY;  Surgeon: Rogene Houston, MD;  [facility];  Service: Endoscopy;  Laterality: N/A;  730   COLONOSCOPY N/A 08/04/2015   Procedure: COLONOSCOPY;  Surgeon: Rogene Houston, MD;  [facility];  Service: Endoscopy;  Laterality: N/A;  200 - moved to 11/11 @ 2:10 - Ann to notify pt   COLONOSCOPY WITH ESOPHAGOGASTRODUODENOSCOPY (EGD) N/A 08/27/2013   Procedure: COLONOSCOPY WITH ESOPHAGOGASTRODUODENOSCOPY (EGD);  Surgeon: Rogene Houston, MD;  [facility];  Service: Endoscopy;  Laterality: N/A;  730   IR REMOVAL TUN ACCESS W/ PORT W/O FL MOD SED  07/17/2018   KNEE ARTHROSCOPY Left    KNEE SURGERY Right    total knee   MALONEY DILATION N/A 08/27/2013   Procedure: MALONEY DILATION;  Surgeon: Rogene Houston, MD;  [facility];  Service: Endoscopy;  Laterality: N/A;   PORTACATH PLACEMENT Right 09/23/2012   PROSTATECTOMY     removal of port Right    SAVORY DILATION N/A 08/27/2013   Procedure: SAVORY DILATION;  Surgeon: Rogene Houston, MD;  [facility];  Service: Endoscopy;  Laterality: N/A;   SHOULDER SURGERY     TONSILLECTOMY     URINARY SPHINCTER IMPLANT N/A 01/23/2017   artificial urinary sphincter implant by Dr. Odis Luster   VASECTOMY      Home Medications:  Medications Prior to Admission  Medication Sig Dispense Refill Last Dose   acetaminophen (  TYLENOL) 325 MG tablet Take 650 mg by mouth every 4 (four) hours as needed for mild pain or moderate pain. Take 2 tablets 1 hour prior to Rituxan treatment.   unknown   albuterol (PROAIR HFA) 108 (90 Base) MCG/ACT inhaler Inhale 2 puffs into the lungs at bedtime as needed for wheezing or shortness of breath. 18 g 1 unknown   amLODipine (NORVASC) 5 MG tablet Take 1 tablet (5 mg total) by mouth daily. 90 tablet 3    budesonide-formoterol (SYMBICORT) 160-4.5 MCG/ACT inhaler Inhale 2 puffs into the lungs 2 (two) times daily. 1 each 11 unknown   docusate sodium  (COLACE) 100 MG capsule Take 200 mg by mouth daily as needed for mild constipation.   unknown   fluticasone (FLONASE) 50 MCG/ACT nasal spray Place 1 spray into both nostrils as needed for allergies or rhinitis.   unknown   glipiZIDE (GLUCOTROL) 10 MG tablet Take 10 mg by mouth 2 (two) times daily before a meal.       insulin lispro (HUMALOG) 100 UNIT/ML cartridge Inject 1-10 Units into the skin as directed. On sliding scale when eating a big meal   unknown   ipratropium (ATROVENT) 0.06 % nasal spray Place 2 sprays into both nostrils 2 (two) times daily.    unknown   meclizine (ANTIVERT) 25 MG tablet Take 25 mg by mouth 2 (two) times daily as needed for dizziness.    Past Week   pantoprazole (PROTONIX) 40 MG tablet TAKE 1 TABLET BY MOUTH 2 TIMES DAILY BEFORE A MEAL. (Patient taking differently: Take 40 mg by mouth 2 (two) times daily.) 180 tablet 0 unknown   temazepam (RESTORIL) 15 MG capsule Take 15 mg by mouth at bedtime as needed for sleep.      testosterone cypionate (DEPOTESTOSTERONE CYPIONATE) 200 MG/ML injection Inject 200 mg into the muscle every 14 (fourteen) days.       [DISCONTINUED] propranolol ER (INDERAL LA) 120 MG 24 hr capsule Take 1 capsule (120 mg total) by mouth at bedtime. For tremor. 90 capsule 3    Accu-Chek FastClix Lancets MISC Apply topically.      ACCU-CHEK GUIDE test strip       [EXPIRED] aspirin EC 81 MG tablet Take 1 tablet (81 mg total) by mouth daily. Swallow whole. (Patient not taking: Reported on 06/06/2021) 30 tablet 0 Not Taking   atorvastatin (LIPITOR) 20 MG tablet Take 20 mg by mouth daily. (Patient not taking: Reported on 06/06/2021)   Not Taking   gabapentin (NEURONTIN) 300 MG capsule Take 1 cap in AM and 2 caps at night (300AM, 600PM) 270 capsule 3    hydrocortisone 2.5 % cream Apply 1 application topically as needed (itching).      [DISCONTINUED] TOUJEO SOLOSTAR 300 UNIT/ML SOPN Inject 70 Units into the skin every morning.       Allergies:  Allergies   Allergen Reactions   Bee Venom Anaphylaxis   Adhesive [Tape] Hives   Ciprofloxacin     Unknown    Codeine     Unknown     Iodine     Unknown     Latex     Unknown     Povidone-Iodine     Unknown     Simvastatin     Unknown      Family History  Problem Relation Age of Onset   Colon cancer Brother    Cancer Brother    Diabetes Father    Cancer Brother    Social History:  reports that he has never smoked. He has never used smokeless tobacco. He reports that he does not drink alcohol and does not use drugs.  ROS: A complete review of systems was performed.  All systems are negative except for pertinent findings as noted. ROS   Physical Exam:  Vital signs in last 24 hours: Temp:  [97.7 F (36.5 C)-97.9 F (36.6 C)] 97.9 F (36.6 C) (09/16 1300) Pulse Rate:  [54-67] 54 (09/16 1300) Resp:  [15-20] 15 (09/16 1300) BP: (129-162)/(80-96) 129/80 (09/16 1300) SpO2:  [95 %-98 %] 96 % (09/16 1300) General:  Alert and oriented, No acute distress HEENT: Normocephalic, atraumatic Neck: No JVD or lymphadenopathy Cardiovascular: Regular rate and rhythm Lungs: Regular rate and effort Abdomen: Soft, nontender, nondistended, no abdominal masses Back: No CVA tenderness Extremities: No edema Genitourinary: Penile edema and ecchymosis of the penis.  Uncircumcised phallus.  Some ecchymosis of the scrotum.  I do not see obvious infection but there is ecchymosis throughout.  He was voiding clear yellow urine Neurologic: Grossly intact  Laboratory Data:  Results for orders placed or performed during the hospital encounter of 06/04/21 (from the past 24 hour(s))  Glucose, capillary     Status: Abnormal   Collection Time: 06/07/21  9:51 PM  Result Value Ref Range   Glucose-Capillary 175 (H) 70 - 99 mg/dL  Glucose, capillary     Status: Abnormal   Collection Time: 06/08/21  5:45 AM  Result Value Ref Range   Glucose-Capillary 168 (H) 70 - 99 mg/dL   Comment 1 Notify RN    Comment  2 Document in Chart   Glucose, capillary     Status: Abnormal   Collection Time: 06/08/21  8:33 AM  Result Value Ref Range   Glucose-Capillary 189 (H) 70 - 99 mg/dL  Basic metabolic panel     Status: Abnormal   Collection Time: 06/08/21  8:38 AM  Result Value Ref Range   Sodium 140 135 - 145 mmol/L   Potassium 3.9 3.5 - 5.1 mmol/L   Chloride 106 98 - 111 mmol/L   CO2 24 22 - 32 mmol/L   Glucose, Bld 191 (H) 70 - 99 mg/dL   BUN 55 (H) 8 - 23 mg/dL   Creatinine, Ser 1.72 (H) 0.61 - 1.24 mg/dL   Calcium 8.1 (L) 8.9 - 10.3 mg/dL   GFR, Estimated 39 (L) >60 mL/min   Anion gap 10 5 - 15  CBC     Status: Abnormal   Collection Time: 06/08/21  8:38 AM  Result Value Ref Range   WBC 6.0 4.0 - 10.5 K/uL   RBC 5.02 4.22 - 5.81 MIL/uL   Hemoglobin 15.3 13.0 - 17.0 g/dL   HCT 46.3 39.0 - 52.0 %   MCV 92.2 80.0 - 100.0 fL   MCH 30.5 26.0 - 34.0 pg   MCHC 33.0 30.0 - 36.0 g/dL   RDW 16.0 (H) 11.5 - 15.5 %   Platelets 122 (L) 150 - 400 K/uL   nRBC 0.0 0.0 - 0.2 %  Glucose, capillary     Status: Abnormal   Collection Time: 06/08/21 11:42 AM  Result Value Ref Range   Glucose-Capillary 169 (H) 70 - 99 mg/dL  Glucose, capillary     Status: Abnormal   Collection Time: 06/08/21  4:34 PM  Result Value Ref Range   Glucose-Capillary 133 (H) 70 - 99 mg/dL   Recent Results (from the past 240 hour(s))  Resp Panel by RT-PCR (Flu A&B, Covid) Nasopharyngeal Swab  Status: Abnormal   Collection Time: 06/04/21 12:23 PM   Specimen: Nasopharyngeal Swab; Nasopharyngeal(NP) swabs in vial transport medium  Result Value Ref Range Status   SARS Coronavirus 2 by RT PCR POSITIVE (A) NEGATIVE Final    Comment: CRITICAL RESULT CALLED TO, READ BACK BY AND VERIFIED WITH: CRAWFORD,H AT 1344 ON 9.12.22 BY RUCINSKI, B (NOTE) SARS-CoV-2 target nucleic acids are DETECTED.  The SARS-CoV-2 RNA is generally detectable in upper respiratory specimens during the acute phase of infection. Positive results are indicative  of the presence of the identified virus, but do not rule out bacterial infection or co-infection with other pathogens not detected by the test. Clinical correlation with patient history and other diagnostic information is necessary to determine patient infection status. The expected result is Negative.  Fact Sheet for Patients: EntrepreneurPulse.com.au  Fact Sheet for Healthcare Providers: IncredibleEmployment.be  This test is not yet approved or cleared by the [place] FDA and  has been authorized for detection and/or diagnosis of SARS-CoV-2 by FDA under an Emergency Use Authorization (EUA).  This EUA will remain in effect (mean ing this test can be used) for the duration of  the COVID-19 declaration under Section 564(b)(1) of the Act, 21 U.S.C. section 360bbb-3(b)(1), unless the authorization is terminated or revoked sooner.     Influenza A by PCR NEGATIVE NEGATIVE Final   Influenza B by PCR NEGATIVE NEGATIVE Final    Comment: (NOTE) The Xpert Xpress SARS-CoV-2/FLU/RSV plus assay is intended as an aid in the diagnosis of influenza from Nasopharyngeal swab specimens and should not be used as a sole basis for treatment. Nasal washings and aspirates are unacceptable for Xpert Xpress SARS-CoV-2/FLU/RSV testing.  Fact Sheet for Patients: EntrepreneurPulse.com.au  Fact Sheet for Healthcare Providers: IncredibleEmployment.be  This test is not yet approved or cleared by the [place] FDA and has been authorized for detection and/or diagnosis of SARS-CoV-2 by FDA under an Emergency Use Authorization (EUA). This EUA will remain in effect (meaning this test can be used) for the duration of the COVID-19 declaration under Section 564(b)(1) of the Act, 21 U.S.C. section 360bbb-3(b)(1), unless the authorization is terminated or revoked.  Performed at [facility], [address] [place], [place] [address]    Blood Culture (routine x 2)     Status: Abnormal   Collection Time: 06/04/21 12:39 PM   Specimen: BLOOD LEFT HAND  Result Value Ref Range Status   Specimen Description   Final    BLOOD LEFT HAND BOTTLES DRAWN AEROBIC AND ANAEROBIC Performed at [facility], [address] [place], [place] [address]    Special Requests   Final    Blood Culture adequate volume Performed at [facility], [address], [place], [place] [address]    Culture  Setup Time   Final    ANAEROBIC BOTTLE ONLY GRAM NEGATIVE RODS Gram Stain Report Called to,Read Back By and Verified With: Belton,k@0044  by Matthews,b 9.13.22 CRITICAL RESULT CALLED TO, READ BACK BY AND VERIFIED WITH: RN CHRISTY DAWSON 06/05/21@3 :57 BY TW  Performed at [facility], [place] [address], [place], [place] [address]    Culture ESCHERICHIA COLI (A)  Final   Report Status 06/07/2021 FINAL  Final   Organism ID, Bacteria ESCHERICHIA COLI  Final      Susceptibility   Escherichia coli - MIC*    AMPICILLIN >=32 RESISTANT Resistant     CEFAZOLIN <=4 SENSITIVE Sensitive     CEFEPIME <=0.12 SENSITIVE Sensitive     CEFTAZIDIME <=1 SENSITIVE Sensitive  CEFTRIAXONE <=0.25 SENSITIVE Sensitive     CIPROFLOXACIN <=0.25 SENSITIVE Sensitive     GENTAMICIN <=1 SENSITIVE Sensitive     IMIPENEM <=0.25 SENSITIVE Sensitive     TRIMETH/SULFA <=20 SENSITIVE Sensitive     AMPICILLIN/SULBACTAM >=32 RESISTANT Resistant     PIP/TAZO <=4 SENSITIVE Sensitive     * ESCHERICHIA COLI  Blood Culture ID Panel (Reflexed)     Status: Abnormal   Collection Time: 06/04/21 12:39 PM  Result Value Ref Range Status   Enterococcus faecalis NOT DETECTED NOT DETECTED Final   Enterococcus Faecium NOT DETECTED NOT DETECTED Final   Listeria monocytogenes NOT DETECTED NOT DETECTED Final   Staphylococcus species NOT DETECTED NOT DETECTED Final   Staphylococcus aureus (BCID) NOT DETECTED NOT DETECTED Final   Staphylococcus epidermidis NOT DETECTED NOT DETECTED Final    Staphylococcus lugdunensis NOT DETECTED NOT DETECTED Final   Streptococcus species NOT DETECTED NOT DETECTED Final   Streptococcus agalactiae NOT DETECTED NOT DETECTED Final   Streptococcus pneumoniae NOT DETECTED NOT DETECTED Final   Streptococcus pyogenes NOT DETECTED NOT DETECTED Final   A.calcoaceticus-baumannii NOT DETECTED NOT DETECTED Final   Bacteroides fragilis NOT DETECTED NOT DETECTED Final   Enterobacterales DETECTED (A) NOT DETECTED Final    Comment: Enterobacterales represent a large order of gram negative bacteria, not a single organism. CRITICAL RESULT CALLED TO, READ BACK BY AND VERIFIED WITH: RN CHRISTY DAWSON 06/05/21@3 :57 BY TW    Enterobacter cloacae complex NOT DETECTED NOT DETECTED Final   Escherichia coli DETECTED (A) NOT DETECTED Final    Comment: CRITICAL RESULT CALLED TO, READ BACK BY AND VERIFIED WITH: RN CHRISTY DAWSON 06/05/21@3 :57 BY TW    Klebsiella aerogenes NOT DETECTED NOT DETECTED Final   Klebsiella oxytoca NOT DETECTED NOT DETECTED Final   Klebsiella pneumoniae NOT DETECTED NOT DETECTED Final   Proteus species NOT DETECTED NOT DETECTED Final   Salmonella species NOT DETECTED NOT DETECTED Final   Serratia marcescens NOT DETECTED NOT DETECTED Final   Haemophilus influenzae NOT DETECTED NOT DETECTED Final   Neisseria meningitidis NOT DETECTED NOT DETECTED Final   Pseudomonas aeruginosa NOT DETECTED NOT DETECTED Final   Stenotrophomonas maltophilia NOT DETECTED NOT DETECTED Final   Candida albicans NOT DETECTED NOT DETECTED Final   Candida auris NOT DETECTED NOT DETECTED Final   Candida glabrata NOT DETECTED NOT DETECTED Final   Candida krusei NOT DETECTED NOT DETECTED Final   Candida parapsilosis NOT DETECTED NOT DETECTED Final   Candida tropicalis NOT DETECTED NOT DETECTED Final   Cryptococcus neoformans/gattii NOT DETECTED NOT DETECTED Final   CTX-M ESBL NOT DETECTED NOT DETECTED Final   Carbapenem resistance IMP NOT DETECTED NOT DETECTED Final    Carbapenem resistance KPC NOT DETECTED NOT DETECTED Final   Carbapenem resistance NDM NOT DETECTED NOT DETECTED Final   Carbapenem resist OXA 48 LIKE NOT DETECTED NOT DETECTED Final   Carbapenem resistance VIM NOT DETECTED NOT DETECTED Final    Comment: Performed at [facility], [address], [place], [place] [address]  Blood Culture (routine x 2)     Status: Abnormal   Collection Time: 06/04/21 12:40 PM   Specimen: BLOOD RIGHT FOREARM  Result Value Ref Range Status   Specimen Description   Final    BLOOD RIGHT FOREARM BOTTLES DRAWN AEROBIC AND ANAEROBIC Performed at [facility], [address], [place], [place] [address]    Special Requests   Final    Blood Culture adequate volume Performed at [facility], [address] [place],  [place] [address]    Culture  Setup Time   Final    ANAEROBIC BOTTLE ONLY GRAM NEGATIVE RODS Gram Stain Report Called to,Read Back By and Verified With: SPENCE,H@0335  BY MATTHEWS, B 9.13.22 Performed at [facility], [address], [place], [place] [address]    Culture (A)  Final    ESCHERICHIA COLI SUSCEPTIBILITIES PERFORMED ON PREVIOUS CULTURE WITHIN THE LAST 5 DAYS. Performed at [facility], [place] [address], [place], [place] [address]    Report Status 06/07/2021 FINAL  Final  Urine Culture     Status: Abnormal   Collection Time: 06/04/21  2:00 PM   Specimen: In/Out Cath Urine  Result Value Ref Range Status   Specimen Description   Final    IN/OUT CATH URINE Performed at [facility], [address], [place], [place] [address]    Special Requests   Final    NONE Performed at [facility], [address] [place], [place] [address]    Culture >=100,000 COLONIES/mL ESCHERICHIA COLI (A)  Final   Report Status 06/07/2021 FINAL  Final   Organism ID, Bacteria ESCHERICHIA COLI (A)  Final      Susceptibility   Escherichia coli - MIC*    AMPICILLIN >=32 RESISTANT Resistant     CEFAZOLIN <=4 SENSITIVE Sensitive     CEFEPIME <=0.12  SENSITIVE Sensitive     CEFTRIAXONE <=0.25 SENSITIVE Sensitive     CIPROFLOXACIN <=0.25 SENSITIVE Sensitive     GENTAMICIN <=1 SENSITIVE Sensitive     IMIPENEM <=0.25 SENSITIVE Sensitive     NITROFURANTOIN <=16 SENSITIVE Sensitive     TRIMETH/SULFA <=20 SENSITIVE Sensitive     AMPICILLIN/SULBACTAM >=32 RESISTANT Resistant     PIP/TAZO <=4 SENSITIVE Sensitive     * >=100,000 COLONIES/mL ESCHERICHIA COLI  Culture, blood (routine x 2)     Status: None (Preliminary result)   Collection Time: 06/05/21 12:43 PM   Specimen: BLOOD LEFT HAND  Result Value Ref Range Status   Specimen Description BLOOD LEFT HAND  Final   Special Requests   Final    BOTTLES DRAWN AEROBIC AND ANAEROBIC Blood Culture adequate volume   Culture   Final    NO GROWTH 2 DAYS Performed at [facility], [address], [place], [place] [address]    Report Status PENDING  Incomplete  Culture, blood (routine x 2)     Status: None (Preliminary result)   Collection Time: 06/05/21 12:43 PM   Specimen: BLOOD RIGHT HAND  Result Value Ref Range Status   Specimen Description BLOOD RIGHT HAND  Final   Special Requests   Final    BOTTLES DRAWN AEROBIC AND ANAEROBIC Blood Culture results may not be optimal due to an excessive volume of blood received in culture bottles   Culture   Final    NO GROWTH 2 DAYS Performed at [facility], [address], [place],  [address]    Report Status PENDING  Incomplete  MRSA Next Gen by PCR, Nasal     Status: None   Collection Time: 06/05/21  4:07 PM   Specimen: Nasal Mucosa; Nasal Swab  Result Value Ref Range Status   MRSA by PCR Next Gen NOT DETECTED NOT DETECTED Final    Comment: (NOTE) The GeneXpert MRSA Assay (FDA approved for NASAL specimens only), is one component of a comprehensive MRSA colonization surveillance program. It is not intended to diagnose MRSA infection nor to guide or monitor treatment for MRSA infections. Test performance is not FDA approved in  patients less  than 47 years old. Performed at [facility], [address] [place], [place] [address]    Creatinine: Recent Labs    06/04/21 1239 06/05/21 0705 06/06/21 0444 06/07/21 0438 06/08/21 0838  CREATININE 2.07* 2.21* 1.95* 1.73* 1.72*   Procedure: Diagnostic cystoscopy Under sterile conditions, I inserted a 17 French flexible cystoscope into the urethra.  There was some pan penile urethral inflammation consistent with his known history of pendulous urethral stricture.  I advanced the scope up to the sphincter.  It looked like the artificial urethral sphincter was closed.  There was significant ecchymosis and inflammation at the area of the sphincter.  I withdrew the scope.  There was a dimple in the artificial urethral sphincter pump bulb but I went ahead and pressed it and deactivated it again.  I reinspected the urethra up to the level of the sphincter.  I could see what looked like erosion of the sphincter at that point.  I therefore withdrew the scope.   Impression/Assessment:  Eroded artificial urinary sphincter Urethral injury Possible infected inflatable penile prosthesis  Plan:  I discussed the findings with the patient.  I am concerned that his artificial urinary sphincter has eroded.  I advised him that he needs cystoscopy in the operating room to confirm and then likely artificial sphincter removal.  Given his penile edema, I am concerned of superimposed early infection of his inflatable penile prosthesis.  I advised him that I also recommend removal of the inflatable penile prosthesis.  This would render him incontinent and impotent.  He understands this.  He understands the risk of bleeding, infection, injury to surrounding structures, need for additional procedures.  I spoke with the hospitalist.  I would like for him to be transferred down to [facility].  I will post the case for tomorrow.  He is voiding.  I asked him to cough after the cystoscopy and deactivation of the  sphincter and he was clearly incontinent.  Marton Redwood, III 06/08/2021, 7:32 PM

## 2021-06-08 NOTE — Evaluation (Signed)
Physical Therapy Evaluation Patient Details Name: [name] MRN: [id number] DOB: 09/15/1935 Today's Date: 06/08/2021  History of Present Illness  [name] is a 86 y.o. male with medical history significant of CAD, HLD, DM, HTN, CKD, B12 deficiency, mantle cell lymphoma of the colon, GERD, PVD, prostate CA, OSA not on CPAP who was seen in the ED on 827 with a positive COVID test.  Per the wife he took medication and felt some better from his COVID.  This morning when he was home he was found to be mildly hypoglycemic per his wife seemed mildly confused.  She gave him juice and eggs and he seemed to recover.  She then try to get him up and walking around and she noted he was weak and confused.  She then called EMS.   Clinical Impression  Patient functioning near baseline for functional mobility and gait other than requiring occasional rest breaks when walking having to lean on nearby objects for support, otherwise no loss of balance.  Patient declined HHPT and stated he will be alright.  Plan:  Patient discharged from physical therapy to care of nursing for ambulation daily as tolerated for length of stay.         Recommendations for follow up therapy are one component of a multi-disciplinary discharge planning process, led by the attending physician.  Recommendations may be updated based on patient status, additional functional criteria and insurance authorization.  Follow Up Recommendations No PT follow up;Supervision - Intermittent    Equipment Recommendations  None recommended by PT    Recommendations for Other Services       Precautions / Restrictions Precautions Precautions: None Restrictions Weight Bearing Restrictions: No      Mobility  Bed Mobility Overal bed mobility: Modified Independent                  Transfers Overall transfer level: Modified independent                  Ambulation/Gait Ambulation/Gait assistance: Modified  independent (Device/Increase time) Gait Distance (Feet): 40 Feet Assistive device: None Gait Pattern/deviations: WFL(Within Functional Limits) Gait velocity: decreased   General Gait Details: grossly WFL, slightly labored cadence with occasional leaning on near by objects for support once fatigued  Stairs            Wheelchair Mobility    Modified Rankin (Stroke Patients Only)       Balance Overall balance assessment: No apparent balance deficits (not formally assessed)                                           Pertinent Vitals/Pain Pain Assessment: No/denies pain    Home Living Family/patient expects to be discharged to:: Private residence Living Arrangements: Spouse/significant other Available Help at Discharge: Family;Available 24 hours/day Type of Home: House Home Access: Level entry     Home Layout: One level Home Equipment: Walker - 2 wheels;Cane - single point      Prior Function Level of Independence: Independent         Comments: Hydrographic surveyor, drives     Journalist, newspaper        Extremity/Trunk Assessment   Upper Extremity Assessment Upper Extremity Assessment: Overall WFL for tasks assessed    Lower Extremity Assessment Lower Extremity Assessment: Overall WFL for tasks assessed    Cervical / Trunk Assessment Cervical /  Trunk Assessment: Normal  Communication   Communication: No difficulties  Cognition Arousal/Alertness: Awake/alert Behavior During Therapy: WFL for tasks assessed/performed Overall Cognitive Status: Within Functional Limits for tasks assessed                                        General Comments      Exercises     Assessment/Plan    PT Assessment Patent does not need any further PT services  PT Problem List         PT Treatment Interventions      PT Goals (Current goals can be found in the Care Plan section)  Acute Rehab PT Goals Patient Stated Goal: return home  with family to assist PT Goal Formulation: With patient Time For Goal Achievement: 06/08/21 Potential to Achieve Goals: Good    Frequency     Barriers to discharge        Co-evaluation               AM-PAC PT "6 Clicks" Mobility  Outcome Measure Help needed turning from your back to your side while in a flat bed without using bedrails?: None Help needed moving from lying on your back to sitting on the side of a flat bed without using bedrails?: None Help needed moving to and from a bed to a chair (including a wheelchair)?: None Help needed standing up from a chair using your arms (e.g., wheelchair or bedside chair)?: None Help needed to walk in hospital room?: None Help needed climbing 3-5 steps with a railing? : A Little 6 Click Score: 23    End of Session   Activity Tolerance: Patient tolerated treatment well;Patient limited by fatigue Patient left: in chair;with call bell/phone within reach Nurse Communication: Mobility status PT Visit Diagnosis: Unsteadiness on feet (R26.81);Other abnormalities of gait and mobility (R26.89);Muscle weakness (generalized) (M62.81)    Time: 5188-4166 PT Time Calculation (min) (ACUTE ONLY): 20 min   Charges:   PT Evaluation $PT Eval Moderate Complexity: 1 Mod PT Treatments $Therapeutic Activity: 8-22 mins        2:21 PM, 06/08/21 Lonell Grandchild, MPT Physical Therapist with [facility] [phone] office [phone] mobile phone

## 2021-06-08 NOTE — Progress Notes (Signed)
Pt voided 100cc of amber colored, cloudy urine. MD notified. Pt also noted to have swelling of his penis and bruising noted. Penis was not swollen nor bruised post foley catheter removal this am. Pt states he doesn't know when it happened nor how. MD notified of findings.

## 2021-06-08 NOTE — Progress Notes (Addendum)
PROGRESS NOTE    [name]  FBP:[id number] DOB: [birth year] DOA: 06/04/2021 PCP: Celene Squibb, MD   Brief Narrative:   [name] is a 86 y.o. male with medical history significant of CAD, HLD, DM, HTN, CKD, B12 deficiency, mantle cell lymphoma of the colon, GERD, PVD, prostate CA, OSA not on CPAP who was seen in the ED on 827 with a positive COVID test.  Per the wife he took medication and felt some better from his COVID.  This morning when he was home he was found to be mildly hypoglycemic per his wife seemed mildly confused.  CT head negative and patient had low-grade fever on admission and urine analysis suggesting UTI.  He was empirically started on cefepime and vancomycin. He is currently on bipap due to some interval development of hypoxemia and respiratory distress.  Assessment & Plan:   Principal Problem:   Acute kidney injury ([place]) Active Problems:   Mixed hyperlipidemia   Hypertension   Coronary atherosclerosis of native coronary artery   GERD   Mantle cell lymphoma (HCC)   Prostate cancer (HCC)   Thrombocytopenia, unspecified (HCC)   B12 deficiency   Chronic kidney disease, stage 3b (HCC)   Mild cognitive impairment   Type 2 diabetes mellitus ([place])   COVID-19 virus infection   Severe Sepsis (POA) secondary to E. coli bacteremia related to UTI with noted AKI -Continue on current antibiotic treatment with Rocephin -Continue IV fluid and monitor repeat labs -Urine culture with findings of E coli infection  -Repeat blood cultures no growth to date  Acute metabolic encephalopathy secondary to above in the setting of mild cognitive impairment -Discontinue sedating agents such as gabapentin and temazepam -Ativan only as needed for agitation -CT head with no acute findings  Acute urinary retention  - Foley placed  - DC'd foley and voiding trial done on 9/16 - flomax ordered.   Penile edema - Pt having penile bruising and pain after foley removal. -  Pt has urethral implant placed in 2018 - Called and discussed with urologist Dr. Gloriann Loan and he will come evaluate it tonight   AKI on CKD stage IIIb - stable to improved -Likely prerenal in the setting of sepsis -Avoid nephrotoxic agents -reduced IV fluid hydration -Monitor strict I's and O's  Acute hypoxemic respiratory failure in the setting of COVID-19 infection -Wean Solu-Medrol to Decadron daily for now and monitor inflammatory markers -Avoid remdesivir as patient has completed course of Paxlovid -Currently on BiPAP,  plan to wean to room air today.  -Chest x-ray with no acute findings  Thrombocytopenia-acute on chronic -Likely secondary to sepsis -Continue monitor with repeat CBC and discontinue aspirin and Lovenox  Type 2 diabetes with hypoglycemia  -A1c 9.2% -SSI adjusted to every 4 dosing -Hold home glipizide -Decreased Lantus insulin further due to low blood glucose  CBG (last 3)  Recent Labs    06/08/21 0833 06/08/21 1142 06/08/21 1634  GLUCAP 189* 169* 133*    Hypertension - better controlled -resume home BP medications -IV as needed labetalol  History of CAD -resume home statin and aspirin  History of mantle cell lymphoma -Has not seen hematology since 2019   DVT prophylaxis:Lovenox to SCDs Code Status: Full Family Communication: daughter/son telephone 9/15, 9/16  Disposition Plan: home with [facility]  Status is: Inpatient  Remains inpatient appropriate because:Hemodynamically unstable, Altered mental status, IV treatments appropriate due to intensity of illness or inability to take PO, and Inpatient level of care appropriate due to  severity of illness  Dispo: The patient is from: Home              Anticipated d/c is to: Home              Patient currently is not medically stable to d/c.   Difficult to place patient No  Consultants:  None  Procedures:  See below  Antimicrobials:  Anti-infectives (From admission, onward)    Start     Dose/Rate  Route Frequency Ordered Stop   06/08/21 0000  cephALEXin (KEFLEX) 500 MG capsule        500 mg Oral 3 times daily 06/08/21 1715 06/14/21 2359   06/06/21 1400  vancomycin (VANCOREADY) IVPB 1750 mg/350 mL  Status:  Discontinued        1,750 mg 175 mL/hr over 120 Minutes Intravenous Every 48 hours 06/04/21 1408 06/04/21 1745   06/05/21 2200  cefTRIAXone (ROCEPHIN) 2 g in sodium chloride 0.9 % 100 mL IVPB        2 g 200 mL/hr over 30 Minutes Intravenous Every 24 hours 06/05/21 0914     06/05/21 1000  ceFEPIme (MAXIPIME) 2 g in sodium chloride 0.9 % 100 mL IVPB  Status:  Discontinued        2 g 200 mL/hr over 30 Minutes Intravenous Every 24 hours 06/04/21 1408 06/04/21 1859   06/05/21 0800  ceFEPIme (MAXIPIME) 2 g in sodium chloride 0.9 % 100 mL IVPB  Status:  Discontinued        2 g 200 mL/hr over 30 Minutes Intravenous Every 24 hours 06/05/21 0519 06/05/21 0913   06/04/21 2130  cefTRIAXone (ROCEPHIN) 2 g in sodium chloride 0.9 % 100 mL IVPB        2 g 200 mL/hr over 30 Minutes Intravenous  Once 06/04/21 2117 06/04/21 2218   06/04/21 1300  vancomycin (VANCOREADY) IVPB 2000 mg/400 mL        2,000 mg 200 mL/hr over 120 Minutes Intravenous  Once 06/04/21 1236 06/04/21 1729   06/04/21 1230  ceFEPIme (MAXIPIME) 2 g in sodium chloride 0.9 % 100 mL IVPB        2 g 200 mL/hr over 30 Minutes Intravenous  Once 06/04/21 1223 06/04/21 1341   06/04/21 1230  metroNIDAZOLE (FLAGYL) IVPB 500 mg        500 mg 100 mL/hr over 60 Minutes Intravenous  Once 06/04/21 1223 06/04/21 1516   06/04/21 1230  vancomycin (VANCOCIN) IVPB 1000 mg/200 mL premix  Status:  Discontinued        1,000 mg 200 mL/hr over 60 Minutes Intravenous  Once 06/04/21 1223 06/04/21 1236       Subjective: Patient reports penile pain after foley removal.      Objective: Vitals:   06/08/21 0535 06/08/21 0753 06/08/21 1259 06/08/21 1300  BP: (!) 162/96  129/80 129/80  Pulse: 67  (!) 57 (!) 54  Resp: 20  15 15   Temp: 97.7 F (36.5  C)  97.9 F (36.6 C) 97.9 F (36.6 C)  TempSrc: Oral     SpO2: 97% 98% 98% 96%  Weight:      Height:        Intake/Output Summary (Last 24 hours) at 06/08/2021 1823 Last data filed at 06/08/2021 1413 Gross per 24 hour  Intake 240 ml  Output 500 ml  Net -260 ml   Filed Weights   06/04/21 1111 06/05/21 1600 06/06/21 0400  Weight: 100 kg 101.9 kg 102.4 kg    Examination:  General exam: awake, alert, confused, NAD, cooperative.  Respiratory system: speaking full sentences while on bipap, no increased work of breathing. Cardiovascular system: normal S1 & S2 heard.   Gastrointestinal system: Abdomen is soft, ND/NT, no HSM.  Central nervous system: nonfocal.  GU: penile swollen and red and tender to palpation Extremities: no C/C/E Skin: no gross lesions seen.  Psychiatry: confused at times, normal affect.    Data Reviewed: I have personally reviewed following labs and imaging studies  CBC: Recent Labs  Lab 06/04/21 1239 06/05/21 0705 06/06/21 0444 06/06/21 2143 06/07/21 0438 06/08/21 0838  WBC 6.0 7.3 8.1  --  8.2 6.0  NEUTROABS 5.0 6.5  --   --  7.3  --   HGB 15.7 13.8 14.1 14.0 13.5 15.3  HCT 48.1 43.8 44.2 43.5 41.8 46.3  MCV 92.0 94.2 93.4  --  92.7 92.2  PLT 64* 55* 67*  --  91* 893*   Basic Metabolic Panel: Recent Labs  Lab 06/04/21 1239 06/05/21 0705 06/06/21 0444 06/07/21 0438 06/08/21 0838  NA 134* 139 143 141 140  K 4.4 4.4 3.9 3.5 3.9  CL 105 107 108 110 106  CO2 19* 23 26 25 24   GLUCOSE 275* 306* 196* 84 191*  BUN 46* 48* 55* 60* 55*  CREATININE 2.07* 2.21* 1.95* 1.73* 1.72*  CALCIUM 8.0* 7.5* 8.0* 7.6* 8.1*  MG  --   --  1.8 1.7  --    GFR: Estimated Creatinine Clearance: 39.6 mL/min (A) (by C-G formula based on SCr of 1.72 mg/dL (H)). Liver Function Tests: Recent Labs  Lab 06/04/21 1239 06/05/21 0705  AST 21 23  ALT 19 17  ALKPHOS 55 43  BILITOT 2.3* 1.1  PROT 6.8 5.3*  ALBUMIN 3.9 2.8*   No results for input(s): LIPASE, AMYLASE  in the last 168 hours. No results for input(s): AMMONIA in the last 168 hours. Coagulation Profile: Recent Labs  Lab 06/04/21 1239  INR 1.1   Cardiac Enzymes: No results for input(s): CKTOTAL, CKMB, CKMBINDEX, TROPONINI in the last 168 hours. BNP (last 3 results) No results for input(s): PROBNP in the last 8760 hours. HbA1C: No results for input(s): HGBA1C in the last 72 hours.  CBG: Recent Labs  Lab 06/07/21 2151 06/08/21 0545 06/08/21 0833 06/08/21 1142 06/08/21 1634  GLUCAP 175* 168* 189* 169* 133*   Lipid Profile: No results for input(s): CHOL, HDL, LDLCALC, TRIG, CHOLHDL, LDLDIRECT in the last 72 hours. Thyroid Function Tests: No results for input(s): TSH, T4TOTAL, FREET4, T3FREE, THYROIDAB in the last 72 hours.  Anemia Panel: No results for input(s): VITAMINB12, FOLATE, FERRITIN, TIBC, IRON, RETICCTPCT in the last 72 hours. Sepsis Labs: Recent Labs  Lab 06/04/21 2132 06/04/21 2315 06/05/21 0458 06/05/21 0705 06/05/21 0915  PROCALCITON  --   --  6.72  --   --   LATICACIDVEN 1.5 1.5  --  1.5 1.0    Recent Results (from the past 240 hour(s))  Resp Panel by RT-PCR (Flu A&B, Covid) Nasopharyngeal Swab     Status: Abnormal   Collection Time: 06/04/21 12:23 PM   Specimen: Nasopharyngeal Swab; Nasopharyngeal(NP) swabs in vial transport medium  Result Value Ref Range Status   SARS Coronavirus 2 by RT PCR POSITIVE (A) NEGATIVE Final    Comment: CRITICAL RESULT CALLED TO, READ BACK BY AND VERIFIED WITH: CRAWFORD,H AT 1344 ON 9.12.22 BY RUCINSKI, B (NOTE) SARS-CoV-2 target nucleic acids are DETECTED.  The SARS-CoV-2 RNA is generally detectable in upper respiratory specimens during the  acute phase of infection. Positive results are indicative of the presence of the identified virus, but do not rule out bacterial infection or co-infection with other pathogens not detected by the test. Clinical correlation with patient history and other diagnostic information is  necessary to determine patient infection status. The expected result is Negative.  Fact Sheet for Patients: EntrepreneurPulse.com.au  Fact Sheet for Healthcare Providers: IncredibleEmployment.be  This test is not yet approved or cleared by the [place] FDA and  has been authorized for detection and/or diagnosis of SARS-CoV-2 by FDA under an Emergency Use Authorization (EUA).  This EUA will remain in effect (mean ing this test can be used) for the duration of  the COVID-19 declaration under Section 564(b)(1) of the Act, 21 U.S.C. section 360bbb-3(b)(1), unless the authorization is terminated or revoked sooner.     Influenza A by PCR NEGATIVE NEGATIVE Final   Influenza B by PCR NEGATIVE NEGATIVE Final    Comment: (NOTE) The Xpert Xpress SARS-CoV-2/FLU/RSV plus assay is intended as an aid in the diagnosis of influenza from Nasopharyngeal swab specimens and should not be used as a sole basis for treatment. Nasal washings and aspirates are unacceptable for Xpert Xpress SARS-CoV-2/FLU/RSV testing.  Fact Sheet for Patients: EntrepreneurPulse.com.au  Fact Sheet for Healthcare Providers: IncredibleEmployment.be  This test is not yet approved or cleared by the [place] FDA and has been authorized for detection and/or diagnosis of SARS-CoV-2 by FDA under an Emergency Use Authorization (EUA). This EUA will remain in effect (meaning this test can be used) for the duration of the COVID-19 declaration under Section 564(b)(1) of the Act, 21 U.S.C. section 360bbb-3(b)(1), unless the authorization is terminated or revoked.  Performed at [facility], [address], [place], [place] [address]   Blood Culture (routine x 2)     Status: Abnormal   Collection Time: 06/04/21 12:39 PM   Specimen: BLOOD LEFT HAND  Result Value Ref Range Status   Specimen Description   Final    BLOOD LEFT HAND BOTTLES DRAWN AEROBIC  AND ANAEROBIC Performed at [facility], [address] [place], [place] [address]    Special Requests   Final    Blood Culture adequate volume Performed at [facility], [address], [place], [place] [address]    Culture  Setup Time   Final    ANAEROBIC BOTTLE ONLY GRAM NEGATIVE RODS Gram Stain Report Called to,Read Back By and Verified With: Belton,k@[email or web address]  by Matthews,b 9.13.22 CRITICAL RESULT CALLED TO, READ BACK BY AND VERIFIED WITH: RN CHRISTY DAWSON 06/05/21@3 :57 BY TW  Performed at [facility], [place] [address] [place], [place] [address]    Culture ESCHERICHIA COLI (A)  Final   Report Status 06/07/2021 FINAL  Final   Organism ID, Bacteria ESCHERICHIA COLI  Final      Susceptibility   Escherichia coli - MIC*    AMPICILLIN >=32 RESISTANT Resistant     CEFAZOLIN <=4 SENSITIVE Sensitive     CEFEPIME <=0.12 SENSITIVE Sensitive     CEFTAZIDIME <=1 SENSITIVE Sensitive     CEFTRIAXONE <=0.25 SENSITIVE Sensitive     CIPROFLOXACIN <=0.25 SENSITIVE Sensitive     GENTAMICIN <=1 SENSITIVE Sensitive     IMIPENEM <=0.25 SENSITIVE Sensitive     TRIMETH/SULFA <=20 SENSITIVE Sensitive     AMPICILLIN/SULBACTAM >=32 RESISTANT Resistant     PIP/TAZO <=4 SENSITIVE Sensitive     * ESCHERICHIA COLI  Blood Culture ID Panel (Reflexed)     Status: Abnormal   Collection Time: 06/04/21  12:39 PM  Result Value Ref Range Status   Enterococcus faecalis NOT DETECTED NOT DETECTED Final   Enterococcus Faecium NOT DETECTED NOT DETECTED Final   Listeria monocytogenes NOT DETECTED NOT DETECTED Final   Staphylococcus species NOT DETECTED NOT DETECTED Final   Staphylococcus aureus (BCID) NOT DETECTED NOT DETECTED Final   Staphylococcus epidermidis NOT DETECTED NOT DETECTED Final   Staphylococcus lugdunensis NOT DETECTED NOT DETECTED Final   Streptococcus species NOT DETECTED NOT DETECTED Final   Streptococcus agalactiae NOT DETECTED NOT DETECTED Final   Streptococcus pneumoniae NOT DETECTED NOT  DETECTED Final   Streptococcus pyogenes NOT DETECTED NOT DETECTED Final   A.calcoaceticus-baumannii NOT DETECTED NOT DETECTED Final   Bacteroides fragilis NOT DETECTED NOT DETECTED Final   Enterobacterales DETECTED (A) NOT DETECTED Final    Comment: Enterobacterales represent a large order of gram negative bacteria, not a single organism. CRITICAL RESULT CALLED TO, READ BACK BY AND VERIFIED WITH: RN CHRISTY DAWSON 06/05/21@3 :57 BY TW    Enterobacter cloacae complex NOT DETECTED NOT DETECTED Final   Escherichia coli DETECTED (A) NOT DETECTED Final    Comment: CRITICAL RESULT CALLED TO, READ BACK BY AND VERIFIED WITH: RN CHRISTY DAWSON 06/05/21@3 :57 BY TW    Klebsiella aerogenes NOT DETECTED NOT DETECTED Final   Klebsiella oxytoca NOT DETECTED NOT DETECTED Final   Klebsiella pneumoniae NOT DETECTED NOT DETECTED Final   Proteus species NOT DETECTED NOT DETECTED Final   Salmonella species NOT DETECTED NOT DETECTED Final   Serratia marcescens NOT DETECTED NOT DETECTED Final   Haemophilus influenzae NOT DETECTED NOT DETECTED Final   Neisseria meningitidis NOT DETECTED NOT DETECTED Final   Pseudomonas aeruginosa NOT DETECTED NOT DETECTED Final   Stenotrophomonas maltophilia NOT DETECTED NOT DETECTED Final   Candida albicans NOT DETECTED NOT DETECTED Final   Candida auris NOT DETECTED NOT DETECTED Final   Candida glabrata NOT DETECTED NOT DETECTED Final   Candida krusei NOT DETECTED NOT DETECTED Final   Candida parapsilosis NOT DETECTED NOT DETECTED Final   Candida tropicalis NOT DETECTED NOT DETECTED Final   Cryptococcus neoformans/gattii NOT DETECTED NOT DETECTED Final   CTX-M ESBL NOT DETECTED NOT DETECTED Final   Carbapenem resistance IMP NOT DETECTED NOT DETECTED Final   Carbapenem resistance KPC NOT DETECTED NOT DETECTED Final   Carbapenem resistance NDM NOT DETECTED NOT DETECTED Final   Carbapenem resist OXA 48 LIKE NOT DETECTED NOT DETECTED Final   Carbapenem resistance VIM NOT  DETECTED NOT DETECTED Final    Comment: Performed at [facility], [address], [place], [place] [address]  Blood Culture (routine x 2)     Status: Abnormal   Collection Time: 06/04/21 12:40 PM   Specimen: BLOOD RIGHT FOREARM  Result Value Ref Range Status   Specimen Description   Final    BLOOD RIGHT FOREARM BOTTLES DRAWN AEROBIC AND ANAEROBIC Performed at [facility], [address] [place], [place] [address]    Special Requests   Final    Blood Culture adequate volume Performed at [facility], [address] [place], [place] [address]    Culture  Setup Time   Final    ANAEROBIC BOTTLE ONLY GRAM NEGATIVE RODS Gram Stain Report Called to,Read Back By and Verified With: SPENCE,[id number]  BY MATTHEWS, B 9.13.22 Performed at [facility], [address], [place], [place] [address]    Culture (A)  Final    ESCHERICHIA COLI SUSCEPTIBILITIES PERFORMED ON PREVIOUS CULTURE WITHIN THE LAST 5 DAYS. Performed at [facility], [address] [place], [place] [address]    Report Status 06/07/2021 FINAL  Final  Urine Culture     Status: Abnormal   Collection Time: 06/04/21  2:00 PM   Specimen: In/Out Cath Urine  Result Value Ref Range Status   Specimen Description   Final    IN/OUT CATH URINE Performed at [facility], [address], [place], [place] [address]    Special Requests   Final    NONE Performed at [facility], [address], [place], [place] [address]    Culture >=100,000 COLONIES/mL ESCHERICHIA COLI (A)  Final   Report Status 06/07/2021 FINAL  Final   Organism ID, Bacteria ESCHERICHIA COLI (A)  Final      Susceptibility   Escherichia coli - MIC*    AMPICILLIN >=32 RESISTANT Resistant     CEFAZOLIN <=4 SENSITIVE Sensitive     CEFEPIME <=0.12 SENSITIVE Sensitive     CEFTRIAXONE <=0.25 SENSITIVE Sensitive     CIPROFLOXACIN <=0.25 SENSITIVE Sensitive     GENTAMICIN <=1 SENSITIVE Sensitive     IMIPENEM <=0.25 SENSITIVE Sensitive     NITROFURANTOIN <=16  SENSITIVE Sensitive     TRIMETH/SULFA <=20 SENSITIVE Sensitive     AMPICILLIN/SULBACTAM >=32 RESISTANT Resistant     PIP/TAZO <=4 SENSITIVE Sensitive     * >=100,000 COLONIES/mL ESCHERICHIA COLI  Culture, blood (routine x 2)     Status: None (Preliminary result)   Collection Time: 06/05/21 12:43 PM   Specimen: BLOOD LEFT HAND  Result Value Ref Range Status   Specimen Description BLOOD LEFT HAND  Final   Special Requests   Final    BOTTLES DRAWN AEROBIC AND ANAEROBIC Blood Culture adequate volume   Culture   Final    NO GROWTH 2 DAYS Performed at [facility], [address], [place],  [address]    Report Status PENDING  Incomplete  Culture, blood (routine x 2)     Status: None (Preliminary result)   Collection Time: 06/05/21 12:43 PM   Specimen: BLOOD RIGHT HAND  Result Value Ref Range Status   Specimen Description BLOOD RIGHT HAND  Final   Special Requests   Final    BOTTLES DRAWN AEROBIC AND ANAEROBIC Blood Culture results may not be optimal due to an excessive volume of blood received in culture bottles   Culture   Final    NO GROWTH 2 DAYS Performed at [facility], [address] [place],  [address]    Report Status PENDING  Incomplete  MRSA Next Gen by PCR, Nasal     Status: None   Collection Time: 06/05/21  4:07 PM   Specimen: Nasal Mucosa; Nasal Swab  Result Value Ref Range Status   MRSA by PCR Next Gen NOT DETECTED NOT DETECTED Final    Comment: (NOTE) The GeneXpert MRSA Assay (FDA approved for NASAL specimens only), is one component of a comprehensive MRSA colonization surveillance program. It is not intended to diagnose MRSA infection nor to guide or monitor treatment for MRSA infections. Test performance is not FDA approved in patients less than 60 years old. Performed at [facility], [address], [place],  [address]     Radiology Studies: No results found.  Scheduled Meds:  amLODipine  5 mg Oral Daily   aspirin EC  81 mg Oral Daily    atorvastatin  20 mg Oral Daily   chlorhexidine  15 mL Mouth Rinse BID   Chlorhexidine Gluconate Cloth  6 each Topical Daily   dexamethasone (  DECADRON) injection  6 mg Intravenous Q24H   insulin aspart  0-15 Units Subcutaneous TID WC   insulin glargine-yfgn  25 Units Subcutaneous BH-q7a   ipratropium  2 spray Each Nare BID   mouth rinse  15 mL Mouth Rinse q12n4p   mometasone-formoterol  2 puff Inhalation BID   pantoprazole  40 mg Oral Q0600   propranolol ER  80 mg Oral QHS   tamsulosin  0.4 mg Oral QPC supper   Continuous Infusions:  cefTRIAXone (ROCEPHIN)  IV 2 g (06/08/21 1555)   lactated ringers 50 mL/hr at 06/07/21 2302     LOS: 3 days   Time spent: 35 minutes  Kyisha Fowle Wynetta Emery, MD How to contact the [facility] Attending or Consulting provider [place] or covering provider during after hours [place], for this patient?  Check the care team in [facility] and look for a) attending/consulting [facility] provider listed and b) the [facility] team listed Log into [email or web address] and use [place]'s universal password to access. If you do not have the password, please contact the hospital operator. Locate the [facility] provider you are looking for under [facility] and page to a number that you can be directly reached. If you still have difficulty reaching the provider, please page the [facility] (Director on Call) for the Hospitalists listed on amion for assistance.   If 7PM-7AM, please contact night-coverage [email or web address] 06/08/2021, 6:23 PM

## 2021-06-08 NOTE — Progress Notes (Signed)
Transfer orders were placed.  This RN called [facility] to provide report. This RN called [phone] to give report to receiving RN.  Pt's daughter was with pt when [facility] arrived. Family aware of transfer and new room at [facility]. [facility] arrived at 2145 to p/u pt.  Pt's daughter took all of pt's belongings.

## 2021-06-08 NOTE — Progress Notes (Signed)
Urology bedside.

## 2021-06-08 NOTE — Progress Notes (Signed)
Pt voided 150cc, emptied at 1845 amber in color and cloudy, this makes a total of 250cc of measured urine emptied on this shift.

## 2021-06-08 NOTE — Progress Notes (Signed)
Pts foley catheter removed at 9am per MD order. Pt was c/o not being able to urinate. Post removal pt was told to urinate in urinal for measurement purposes. Pt urinated in toilet instead. Urine amber in color. Not able to determine amount. Pt states "I feel better after voiding". Pt re-educated on the need to urinate in urinal.

## 2021-06-08 NOTE — Consult Note (Signed)
H&P Physician requesting consult: Irwin Brakeman, MD  Chief Complaint: Urinary retention, penile edema following catheter placement with an artificial sphincter  History of Present Illness: 86 year old male with history of prostate cancer status post:   Hx of RALP 02/24/08 ([place]) with EXE/PNI/+SM and adjuvant XRT/ADT until 08/26/08 for prostate CA Male sling and LGX IPP 06/11/10 AUS 01/23/17 Hx of penile urethral stricture, previously dilated  He was COVID-positive on 8/27.  He was admitted to the hospital on 9/12 after presenting with confusion and he was admitted for UTI and started on cefepime and vancomycin.  Foley catheter was placed at that time.  Since then, he has had several removals and insertions.  Today he was noted to have penile edema and bruising.  He is currently voiding clear yellow urine and is incontinent.  He is a poor historian.  He has both an artificial sphincter and a penile implant.  Urine culture was positive for E. coli resistant to ampicillin, otherwise sensitive.  He is on ceftriaxone.  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, anomalous circumflex - no PCIs   Essential hypertension, benign    GERD (gastroesophageal reflux disease)    Mixed hyperlipidemia    Obstructive sleep apnea    does not use   Prostate cancer ([place])    PVD (peripheral vascular disease) ([place])    Type 2 diabetes mellitus ([place])    Past Surgical History:  Procedure Laterality Date   BACK SURGERY     BALLOON DILATION N/A 08/27/2013   Procedure: BALLOON DILATION;  Surgeon: Rogene Houston, MD;  [facility];  Service: Endoscopy;  Laterality: N/A;   COLON SURGERY     COLONOSCOPY N/A 12/17/2013   Procedure: COLONOSCOPY;  Surgeon: Rogene Houston, MD;  [facility];  Service: Endoscopy;  Laterality: N/A;  940   COLONOSCOPY N/A  02/28/2014   Procedure: COLONOSCOPY;  Surgeon: Rogene Houston, MD;  [facility];  Service: Endoscopy;  Laterality: N/A;  730   COLONOSCOPY N/A 08/04/2015   Procedure: COLONOSCOPY;  Surgeon: Rogene Houston, MD;  [facility];  Service: Endoscopy;  Laterality: N/A;  200 - moved to 11/11 @ 2:10 - Ann to notify pt   COLONOSCOPY WITH ESOPHAGOGASTRODUODENOSCOPY (EGD) N/A 08/27/2013   Procedure: COLONOSCOPY WITH ESOPHAGOGASTRODUODENOSCOPY (EGD);  Surgeon: Rogene Houston, MD;  [facility];  Service: Endoscopy;  Laterality: N/A;  730   IR REMOVAL TUN ACCESS W/ PORT W/O FL MOD SED  07/17/2018   KNEE ARTHROSCOPY Left    KNEE SURGERY Right    total knee   MALONEY DILATION N/A 08/27/2013   Procedure: MALONEY DILATION;  Surgeon: Rogene Houston, MD;  [facility];  Service: Endoscopy;  Laterality: N/A;   PORTACATH PLACEMENT Right 09/23/2012   PROSTATECTOMY     removal of port Right    SAVORY DILATION N/A 08/27/2013   Procedure: SAVORY DILATION;  Surgeon: Rogene Houston, MD;  [facility];  Service: Endoscopy;  Laterality: N/A;   SHOULDER SURGERY     TONSILLECTOMY     URINARY SPHINCTER IMPLANT N/A 01/23/2017   artificial urinary sphincter implant by Dr. Odis Luster   VASECTOMY      Home Medications:  Medications Prior to Admission  Medication Sig Dispense Refill Last Dose   acetaminophen (  TYLENOL) 325 MG tablet Take 650 mg by mouth every 4 (four) hours as needed for mild pain or moderate pain. Take 2 tablets 1 hour prior to Rituxan treatment.   unknown   albuterol (PROAIR HFA) 108 (90 Base) MCG/ACT inhaler Inhale 2 puffs into the lungs at bedtime as needed for wheezing or shortness of breath. 18 g 1 unknown   amLODipine (NORVASC) 5 MG tablet Take 1 tablet (5 mg total) by mouth daily. 90 tablet 3    budesonide-formoterol (SYMBICORT) 160-4.5 MCG/ACT inhaler Inhale 2 puffs into the lungs 2 (two) times daily. 1 each 11 unknown   docusate sodium  (COLACE) 100 MG capsule Take 200 mg by mouth daily as needed for mild constipation.   unknown   fluticasone (FLONASE) 50 MCG/ACT nasal spray Place 1 spray into both nostrils as needed for allergies or rhinitis.   unknown   glipiZIDE (GLUCOTROL) 10 MG tablet Take 10 mg by mouth 2 (two) times daily before a meal.       insulin lispro (HUMALOG) 100 UNIT/ML cartridge Inject 1-10 Units into the skin as directed. On sliding scale when eating a big meal   unknown   ipratropium (ATROVENT) 0.06 % nasal spray Place 2 sprays into both nostrils 2 (two) times daily.    unknown   meclizine (ANTIVERT) 25 MG tablet Take 25 mg by mouth 2 (two) times daily as needed for dizziness.    Past Week   pantoprazole (PROTONIX) 40 MG tablet TAKE 1 TABLET BY MOUTH 2 TIMES DAILY BEFORE A MEAL. (Patient taking differently: Take 40 mg by mouth 2 (two) times daily.) 180 tablet 0 unknown   temazepam (RESTORIL) 15 MG capsule Take 15 mg by mouth at bedtime as needed for sleep.      testosterone cypionate (DEPOTESTOSTERONE CYPIONATE) 200 MG/ML injection Inject 200 mg into the muscle every 14 (fourteen) days.       [DISCONTINUED] propranolol ER (INDERAL LA) 120 MG 24 hr capsule Take 1 capsule (120 mg total) by mouth at bedtime. For tremor. 90 capsule 3    Accu-Chek FastClix Lancets MISC Apply topically.      ACCU-CHEK GUIDE test strip       [EXPIRED] aspirin EC 81 MG tablet Take 1 tablet (81 mg total) by mouth daily. Swallow whole. (Patient not taking: Reported on 06/06/2021) 30 tablet 0 Not Taking   atorvastatin (LIPITOR) 20 MG tablet Take 20 mg by mouth daily. (Patient not taking: Reported on 06/06/2021)   Not Taking   gabapentin (NEURONTIN) 300 MG capsule Take 1 cap in AM and 2 caps at night (300AM, 600PM) 270 capsule 3    hydrocortisone 2.5 % cream Apply 1 application topically as needed (itching).      [DISCONTINUED] TOUJEO SOLOSTAR 300 UNIT/ML SOPN Inject 70 Units into the skin every morning.       Allergies:  Allergies   Allergen Reactions   Bee Venom Anaphylaxis   Adhesive [Tape] Hives   Ciprofloxacin     Unknown    Codeine     Unknown     Iodine     Unknown     Latex     Unknown     Povidone-Iodine     Unknown     Simvastatin     Unknown      Family History  Problem Relation Age of Onset   Colon cancer Brother    Cancer Brother    Diabetes Father    Cancer Brother    Social History:  reports that he has never smoked. He has never used smokeless tobacco. He reports that he does not drink alcohol and does not use drugs.  ROS: A complete review of systems was performed.  All systems are negative except for pertinent findings as noted. ROS   Physical Exam:  Vital signs in last 24 hours: Temp:  [97.7 F (36.5 C)-97.9 F (36.6 C)] 97.9 F (36.6 C) (09/16 1300) Pulse Rate:  [54-67] 54 (09/16 1300) Resp:  [15-20] 15 (09/16 1300) BP: (129-162)/(80-96) 129/80 (09/16 1300) SpO2:  [95 %-98 %] 96 % (09/16 1300) General:  Alert and oriented, No acute distress HEENT: Normocephalic, atraumatic Neck: No JVD or lymphadenopathy Cardiovascular: Regular rate and rhythm Lungs: Regular rate and effort Abdomen: Soft, nontender, nondistended, no abdominal masses Back: No CVA tenderness Extremities: No edema Genitourinary: Penile edema and ecchymosis of the penis.  Uncircumcised phallus.  Some ecchymosis of the scrotum.  I do not see obvious infection but there is ecchymosis throughout.  He was voiding clear yellow urine Neurologic: Grossly intact  Laboratory Data:  Results for orders placed or performed during the hospital encounter of 06/04/21 (from the past 24 hour(s))  Glucose, capillary     Status: Abnormal   Collection Time: 06/07/21  9:51 PM  Result Value Ref Range   Glucose-Capillary 175 (H) 70 - 99 mg/dL  Glucose, capillary     Status: Abnormal   Collection Time: 06/08/21  5:45 AM  Result Value Ref Range   Glucose-Capillary 168 (H) 70 - 99 mg/dL   Comment 1 Notify RN    Comment  2 Document in Chart   Glucose, capillary     Status: Abnormal   Collection Time: 06/08/21  8:33 AM  Result Value Ref Range   Glucose-Capillary 189 (H) 70 - 99 mg/dL  Basic metabolic panel     Status: Abnormal   Collection Time: 06/08/21  8:38 AM  Result Value Ref Range   Sodium 140 135 - 145 mmol/L   Potassium 3.9 3.5 - 5.1 mmol/L   Chloride 106 98 - 111 mmol/L   CO2 24 22 - 32 mmol/L   Glucose, Bld 191 (H) 70 - 99 mg/dL   BUN 55 (H) 8 - 23 mg/dL   Creatinine, Ser 1.72 (H) 0.61 - 1.24 mg/dL   Calcium 8.1 (L) 8.9 - 10.3 mg/dL   GFR, Estimated 39 (L) >60 mL/min   Anion gap 10 5 - 15  CBC     Status: Abnormal   Collection Time: 06/08/21  8:38 AM  Result Value Ref Range   WBC 6.0 4.0 - 10.5 K/uL   RBC 5.02 4.22 - 5.81 MIL/uL   Hemoglobin 15.3 13.0 - 17.0 g/dL   HCT 46.3 39.0 - 52.0 %   MCV 92.2 80.0 - 100.0 fL   MCH 30.5 26.0 - 34.0 pg   MCHC 33.0 30.0 - 36.0 g/dL   RDW 16.0 (H) 11.5 - 15.5 %   Platelets 122 (L) 150 - 400 K/uL   nRBC 0.0 0.0 - 0.2 %  Glucose, capillary     Status: Abnormal   Collection Time: 06/08/21 11:42 AM  Result Value Ref Range   Glucose-Capillary 169 (H) 70 - 99 mg/dL  Glucose, capillary     Status: Abnormal   Collection Time: 06/08/21  4:34 PM  Result Value Ref Range   Glucose-Capillary 133 (H) 70 - 99 mg/dL   Recent Results (from the past 240 hour(s))  Resp Panel by RT-PCR (Flu A&B, Covid) Nasopharyngeal Swab  Status: Abnormal   Collection Time: 06/04/21 12:23 PM   Specimen: Nasopharyngeal Swab; Nasopharyngeal(NP) swabs in vial transport medium  Result Value Ref Range Status   SARS Coronavirus 2 by RT PCR POSITIVE (A) NEGATIVE Final    Comment: CRITICAL RESULT CALLED TO, READ BACK BY AND VERIFIED WITH: CRAWFORD,H AT 1344 ON 9.12.22 BY RUCINSKI, B (NOTE) SARS-CoV-2 target nucleic acids are DETECTED.  The SARS-CoV-2 RNA is generally detectable in upper respiratory specimens during the acute phase of infection. Positive results are indicative  of the presence of the identified virus, but do not rule out bacterial infection or co-infection with other pathogens not detected by the test. Clinical correlation with patient history and other diagnostic information is necessary to determine patient infection status. The expected result is Negative.  Fact Sheet for Patients: EntrepreneurPulse.com.au  Fact Sheet for Healthcare Providers: IncredibleEmployment.be  This test is not yet approved or cleared by the [place] FDA and  has been authorized for detection and/or diagnosis of SARS-CoV-2 by FDA under an Emergency Use Authorization (EUA).  This EUA will remain in effect (mean ing this test can be used) for the duration of  the COVID-19 declaration under Section 564(b)(1) of the Act, 21 U.S.C. section 360bbb-3(b)(1), unless the authorization is terminated or revoked sooner.     Influenza A by PCR NEGATIVE NEGATIVE Final   Influenza B by PCR NEGATIVE NEGATIVE Final    Comment: (NOTE) The Xpert Xpress SARS-CoV-2/FLU/RSV plus assay is intended as an aid in the diagnosis of influenza from Nasopharyngeal swab specimens and should not be used as a sole basis for treatment. Nasal washings and aspirates are unacceptable for Xpert Xpress SARS-CoV-2/FLU/RSV testing.  Fact Sheet for Patients: EntrepreneurPulse.com.au  Fact Sheet for Healthcare Providers: IncredibleEmployment.be  This test is not yet approved or cleared by the [place] FDA and has been authorized for detection and/or diagnosis of SARS-CoV-2 by FDA under an Emergency Use Authorization (EUA). This EUA will remain in effect (meaning this test can be used) for the duration of the COVID-19 declaration under Section 564(b)(1) of the Act, 21 U.S.C. section 360bbb-3(b)(1), unless the authorization is terminated or revoked.  Performed at [facility], [address], [place], [place] [address]    Blood Culture (routine x 2)     Status: Abnormal   Collection Time: 06/04/21 12:39 PM   Specimen: BLOOD LEFT HAND  Result Value Ref Range Status   Specimen Description   Final    BLOOD LEFT HAND BOTTLES DRAWN AEROBIC AND ANAEROBIC Performed at [facility], [address], [place], [place] [address]    Special Requests   Final    Blood Culture adequate volume Performed at [facility], [address] [place], [place] [address]    Culture  Setup Time   Final    ANAEROBIC BOTTLE ONLY GRAM NEGATIVE RODS Gram Stain Report Called to,Read Back By and Verified With: Belton,k@0044  by Matthews,b 9.13.22 CRITICAL RESULT CALLED TO, READ BACK BY AND VERIFIED WITH: RN CHRISTY DAWSON 06/05/21@3 :57 BY TW  Performed at [facility], [place] [address], [place],  [address]    Culture ESCHERICHIA COLI (A)  Final   Report Status 06/07/2021 FINAL  Final   Organism ID, Bacteria ESCHERICHIA COLI  Final      Susceptibility   Escherichia coli - MIC*    AMPICILLIN >=32 RESISTANT Resistant     CEFAZOLIN <=4 SENSITIVE Sensitive     CEFEPIME <=0.12 SENSITIVE Sensitive     CEFTAZIDIME <=1 SENSITIVE Sensitive  CEFTRIAXONE <=0.25 SENSITIVE Sensitive     CIPROFLOXACIN <=0.25 SENSITIVE Sensitive     GENTAMICIN <=1 SENSITIVE Sensitive     IMIPENEM <=0.25 SENSITIVE Sensitive     TRIMETH/SULFA <=20 SENSITIVE Sensitive     AMPICILLIN/SULBACTAM >=32 RESISTANT Resistant     PIP/TAZO <=4 SENSITIVE Sensitive     * ESCHERICHIA COLI  Blood Culture ID Panel (Reflexed)     Status: Abnormal   Collection Time: 06/04/21 12:39 PM  Result Value Ref Range Status   Enterococcus faecalis NOT DETECTED NOT DETECTED Final   Enterococcus Faecium NOT DETECTED NOT DETECTED Final   Listeria monocytogenes NOT DETECTED NOT DETECTED Final   Staphylococcus species NOT DETECTED NOT DETECTED Final   Staphylococcus aureus (BCID) NOT DETECTED NOT DETECTED Final   Staphylococcus epidermidis NOT DETECTED NOT DETECTED Final    Staphylococcus lugdunensis NOT DETECTED NOT DETECTED Final   Streptococcus species NOT DETECTED NOT DETECTED Final   Streptococcus agalactiae NOT DETECTED NOT DETECTED Final   Streptococcus pneumoniae NOT DETECTED NOT DETECTED Final   Streptococcus pyogenes NOT DETECTED NOT DETECTED Final   A.calcoaceticus-baumannii NOT DETECTED NOT DETECTED Final   Bacteroides fragilis NOT DETECTED NOT DETECTED Final   Enterobacterales DETECTED (A) NOT DETECTED Final    Comment: Enterobacterales represent a large order of gram negative bacteria, not a single organism. CRITICAL RESULT CALLED TO, READ BACK BY AND VERIFIED WITH: RN CHRISTY DAWSON 06/05/21@3 :57 BY TW    Enterobacter cloacae complex NOT DETECTED NOT DETECTED Final   Escherichia coli DETECTED (A) NOT DETECTED Final    Comment: CRITICAL RESULT CALLED TO, READ BACK BY AND VERIFIED WITH: RN CHRISTY DAWSON 06/05/21@3 :57 BY TW    Klebsiella aerogenes NOT DETECTED NOT DETECTED Final   Klebsiella oxytoca NOT DETECTED NOT DETECTED Final   Klebsiella pneumoniae NOT DETECTED NOT DETECTED Final   Proteus species NOT DETECTED NOT DETECTED Final   Salmonella species NOT DETECTED NOT DETECTED Final   Serratia marcescens NOT DETECTED NOT DETECTED Final   Haemophilus influenzae NOT DETECTED NOT DETECTED Final   Neisseria meningitidis NOT DETECTED NOT DETECTED Final   Pseudomonas aeruginosa NOT DETECTED NOT DETECTED Final   Stenotrophomonas maltophilia NOT DETECTED NOT DETECTED Final   Candida albicans NOT DETECTED NOT DETECTED Final   Candida auris NOT DETECTED NOT DETECTED Final   Candida glabrata NOT DETECTED NOT DETECTED Final   Candida krusei NOT DETECTED NOT DETECTED Final   Candida parapsilosis NOT DETECTED NOT DETECTED Final   Candida tropicalis NOT DETECTED NOT DETECTED Final   Cryptococcus neoformans/gattii NOT DETECTED NOT DETECTED Final   CTX-M ESBL NOT DETECTED NOT DETECTED Final   Carbapenem resistance IMP NOT DETECTED NOT DETECTED Final    Carbapenem resistance KPC NOT DETECTED NOT DETECTED Final   Carbapenem resistance NDM NOT DETECTED NOT DETECTED Final   Carbapenem resist OXA 48 LIKE NOT DETECTED NOT DETECTED Final   Carbapenem resistance VIM NOT DETECTED NOT DETECTED Final    Comment: Performed at [facility], [address] [place], [place] [address]  Blood Culture (routine x 2)     Status: Abnormal   Collection Time: 06/04/21 12:40 PM   Specimen: BLOOD RIGHT FOREARM  Result Value Ref Range Status   Specimen Description   Final    BLOOD RIGHT FOREARM BOTTLES DRAWN AEROBIC AND ANAEROBIC Performed at [facility], [address], [place], [place] [address]    Special Requests   Final    Blood Culture adequate volume Performed at [facility], [address] [place],  [place] [address]    Culture  Setup Time   Final    ANAEROBIC BOTTLE ONLY GRAM NEGATIVE RODS Gram Stain Report Called to,Read Back By and Verified With: SPENCE,H@0335  BY MATTHEWS, B 9.13.22 Performed at [facility], [address] [place], [place] [address]    Culture (A)  Final    ESCHERICHIA COLI SUSCEPTIBILITIES PERFORMED ON PREVIOUS CULTURE WITHIN THE LAST 5 DAYS. Performed at [facility], [place] [address], [place], [place] [address]    Report Status 06/07/2021 FINAL  Final  Urine Culture     Status: Abnormal   Collection Time: 06/04/21  2:00 PM   Specimen: In/Out Cath Urine  Result Value Ref Range Status   Specimen Description   Final    IN/OUT CATH URINE Performed at [facility], [address], [place], [place] [address]    Special Requests   Final    NONE Performed at [facility], [address], [place], [place] [address]    Culture >=100,000 COLONIES/mL ESCHERICHIA COLI (A)  Final   Report Status 06/07/2021 FINAL  Final   Organism ID, Bacteria ESCHERICHIA COLI (A)  Final      Susceptibility   Escherichia coli - MIC*    AMPICILLIN >=32 RESISTANT Resistant     CEFAZOLIN <=4 SENSITIVE Sensitive     CEFEPIME <=0.12  SENSITIVE Sensitive     CEFTRIAXONE <=0.25 SENSITIVE Sensitive     CIPROFLOXACIN <=0.25 SENSITIVE Sensitive     GENTAMICIN <=1 SENSITIVE Sensitive     IMIPENEM <=0.25 SENSITIVE Sensitive     NITROFURANTOIN <=16 SENSITIVE Sensitive     TRIMETH/SULFA <=20 SENSITIVE Sensitive     AMPICILLIN/SULBACTAM >=32 RESISTANT Resistant     PIP/TAZO <=4 SENSITIVE Sensitive     * >=100,000 COLONIES/mL ESCHERICHIA COLI  Culture, blood (routine x 2)     Status: None (Preliminary result)   Collection Time: 06/05/21 12:43 PM   Specimen: BLOOD LEFT HAND  Result Value Ref Range Status   Specimen Description BLOOD LEFT HAND  Final   Special Requests   Final    BOTTLES DRAWN AEROBIC AND ANAEROBIC Blood Culture adequate volume   Culture   Final    NO GROWTH 2 DAYS Performed at [facility], [address], [place], [place] [address]    Report Status PENDING  Incomplete  Culture, blood (routine x 2)     Status: None (Preliminary result)   Collection Time: 06/05/21 12:43 PM   Specimen: BLOOD RIGHT HAND  Result Value Ref Range Status   Specimen Description BLOOD RIGHT HAND  Final   Special Requests   Final    BOTTLES DRAWN AEROBIC AND ANAEROBIC Blood Culture results may not be optimal due to an excessive volume of blood received in culture bottles   Culture   Final    NO GROWTH 2 DAYS Performed at [facility], [address], [place], [place] [address]    Report Status PENDING  Incomplete  MRSA Next Gen by PCR, Nasal     Status: None   Collection Time: 06/05/21  4:07 PM   Specimen: Nasal Mucosa; Nasal Swab  Result Value Ref Range Status   MRSA by PCR Next Gen NOT DETECTED NOT DETECTED Final    Comment: (NOTE) The GeneXpert MRSA Assay (FDA approved for NASAL specimens only), is one component of a comprehensive MRSA colonization surveillance program. It is not intended to diagnose MRSA infection nor to guide or monitor treatment for MRSA infections. Test performance is not FDA approved in  patients less  than 35 years old. Performed at [facility], [address], [place], [place] [address]    Creatinine: Recent Labs    06/04/21 1239 06/05/21 0705 06/06/21 0444 06/07/21 0438 06/08/21 0838  CREATININE 2.07* 2.21* 1.95* 1.73* 1.72*   Procedure: Diagnostic cystoscopy Under sterile conditions, I inserted a 17 French flexible cystoscope into the urethra.  There was some pan penile urethral inflammation consistent with his known history of pendulous urethral stricture.  I advanced the scope up to the sphincter.  It looked like the artificial urethral sphincter was closed.  There was significant ecchymosis and inflammation at the area of the sphincter.  I withdrew the scope.  There was a dimple in the artificial urethral sphincter pump bulb but I went ahead and pressed it and deactivated it again.  I reinspected the urethra up to the level of the sphincter.  I could see what looked like erosion of the sphincter at that point.  I therefore withdrew the scope.   Impression/Assessment:  Eroded artificial urinary sphincter Urethral injury Possible infected inflatable penile prosthesis  Plan:  I discussed the findings with the patient.  I am concerned that his artificial urinary sphincter has eroded.  I advised him that he needs cystoscopy in the operating room to confirm and then likely artificial sphincter removal.  Given his penile edema, I am concerned of superimposed early infection of his inflatable penile prosthesis.  I advised him that I also recommend removal of the inflatable penile prosthesis.  This would render him incontinent and impotent.  He understands this.  He understands the risk of bleeding, infection, injury to surrounding structures, need for additional procedures.  I spoke with the hospitalist.  I would like for him to be transferred down to [facility] long.  I will post the case for tomorrow.  He is voiding.  I asked him to cough after the cystoscopy and deactivation of the  sphincter and he was clearly incontinent.  Marton Redwood, III 06/08/2021, 7:32 PM

## 2021-06-08 NOTE — Progress Notes (Signed)
MD bedside to see pt who is experiencing penile edema. MD made aware that no issues were present during removal of foley catheter, catheter was removed with ease. Lisa NT bedside during removal of catheter as well. MD notified this nurse that urology will be seeing pt before any further orders involving a catheter can be discussed.

## 2021-06-08 NOTE — Care Management Important Message (Signed)
Important Message  Patient Details  Name: [name] MRN: [id number] Date of Birth: 08-01-1935   Medicare Important Message Given:  Yes - Important Message mailed due to current National Emergency     Tommy Medal 06/08/2021, 3:19 PM

## 2021-06-09 ENCOUNTER — Encounter (HOSPITAL_COMMUNITY): Payer: Self-pay | Admitting: Internal Medicine

## 2021-06-09 ENCOUNTER — Inpatient Hospital Stay (HOSPITAL_COMMUNITY): Payer: Medicare Other | Admitting: Certified Registered Nurse Anesthetist

## 2021-06-09 ENCOUNTER — Inpatient Hospital Stay: Admit: 2021-06-09 | Payer: Medicare Other | Admitting: Urology

## 2021-06-09 ENCOUNTER — Encounter (HOSPITAL_COMMUNITY)
Admission: EM | Disposition: A | Discharge status: Home / Self Care | Payer: Self-pay | Source: Home / Self Care | Attending: Internal Medicine

## 2021-06-09 DIAGNOSIS — N179 Acute kidney failure, unspecified: Secondary | ICD-10-CM | POA: Diagnosis not present

## 2021-06-09 HISTORY — PX: CYSTOSCOPY: SHX5120

## 2021-06-09 HISTORY — PX: REMOVAL OF PENILE PROSTHESIS: SHX6059

## 2021-06-09 LAB — CBC WITH DIFFERENTIAL/PLATELET
Abs Immature Granulocytes: 0.1 10*3/uL — ABNORMAL HIGH (ref 0.00–0.07)
Basophils Absolute: 0 10*3/uL (ref 0.0–0.1)
Basophils Relative: 0 %
Eosinophils Absolute: 0 10*3/uL (ref 0.0–0.5)
Eosinophils Relative: 0 %
HCT: 40.7 % (ref 39.0–52.0)
Hemoglobin: 13.6 g/dL (ref 13.0–17.0)
Immature Granulocytes: 2 %
Lymphocytes Relative: 17 %
Lymphs Abs: 0.8 10*3/uL (ref 0.7–4.0)
MCH: 29.9 pg (ref 26.0–34.0)
MCHC: 33.4 g/dL (ref 30.0–36.0)
MCV: 89.5 fL (ref 80.0–100.0)
Monocytes Absolute: 0.4 10*3/uL (ref 0.1–1.0)
Monocytes Relative: 8 %
Neutro Abs: 3.4 10*3/uL (ref 1.7–7.7)
Neutrophils Relative %: 73 %
Platelets: 114 10*3/uL — ABNORMAL LOW (ref 150–400)
RBC: 4.55 MIL/uL (ref 4.22–5.81)
RDW: 15.8 % — ABNORMAL HIGH (ref 11.5–15.5)
WBC: 4.6 10*3/uL (ref 4.0–10.5)
nRBC: 0 % (ref 0.0–0.2)

## 2021-06-09 LAB — BASIC METABOLIC PANEL
Anion gap: 9 (ref 5–15)
BUN: 56 mg/dL — ABNORMAL HIGH (ref 8–23)
CO2: 29 mmol/L (ref 22–32)
Calcium: 8.2 mg/dL — ABNORMAL LOW (ref 8.9–10.3)
Chloride: 104 mmol/L (ref 98–111)
Creatinine, Ser: 1.27 mg/dL — ABNORMAL HIGH (ref 0.61–1.24)
GFR, Estimated: 56 mL/min — ABNORMAL LOW (ref >60)
Glucose, Bld: 51 mg/dL — ABNORMAL LOW (ref 70–99)
Potassium: 3.7 mmol/L (ref 3.5–5.1)
Sodium: 142 mmol/L (ref 135–145)

## 2021-06-09 LAB — SURGICAL PCR SCREEN
MRSA, PCR: NEGATIVE
Staphylococcus aureus: NEGATIVE

## 2021-06-09 LAB — GLUCOSE, CAPILLARY
Glucose-Capillary: 61 mg/dL — ABNORMAL LOW (ref 70–99)
Glucose-Capillary: 105 mg/dL — ABNORMAL HIGH (ref 70–99)
Glucose-Capillary: 75 mg/dL (ref 70–99)
Glucose-Capillary: 115 mg/dL — ABNORMAL HIGH (ref 70–99)

## 2021-06-09 SURGERY — CYSTOSCOPY
Anesthesia: General | Site: Urethra

## 2021-06-09 MED ORDER — AMISULPRIDE (ANTIEMETIC) 5 MG/2ML IV SOLN: 5.0000 mg | Freq: Once | INTRAVENOUS | Status: DC | PRN

## 2021-06-09 MED ORDER — PHENYLEPHRINE HCL-NACL 20-0.9 MG/250ML-% IV SOLN
INTRAVENOUS | Status: DC | PRN
  Administered 2021-06-09: 50 ug/min via INTRAVENOUS

## 2021-06-09 MED ORDER — 0.9 % SODIUM CHLORIDE (POUR BTL) OPTIME
TOPICAL | Status: DC | PRN
  Administered 2021-06-09: 1000 mL

## 2021-06-09 MED ORDER — ACETAMINOPHEN 10 MG/ML IV SOLN
1000.0000 mg | Freq: Once | INTRAVENOUS | Status: DC | PRN
Start: 2021-06-09 — End: 2021-06-09

## 2021-06-09 MED ORDER — ROCURONIUM BROMIDE 10 MG/ML (PF) SYRINGE
PREFILLED_SYRINGE | INTRAVENOUS | Status: AC
  Filled 2021-06-09: qty 10

## 2021-06-09 MED ORDER — SODIUM CHLORIDE 0.9 % IV SOLN
INTRAVENOUS | Status: AC
  Filled 2021-06-09: qty 20

## 2021-06-09 MED ORDER — FENTANYL CITRATE (PF) 100 MCG/2ML IJ SOLN
INTRAMUSCULAR | Status: AC
  Filled 2021-06-09: qty 2

## 2021-06-09 MED ORDER — PHENYLEPHRINE 40 MCG/ML (10ML) SYRINGE FOR IV PUSH (FOR BLOOD PRESSURE SUPPORT)
PREFILLED_SYRINGE | INTRAVENOUS | Status: AC
  Filled 2021-06-09: qty 10

## 2021-06-09 MED ORDER — HYDROMORPHONE HCL 1 MG/ML IJ SOLN
0.5000 mg | INTRAMUSCULAR | Status: DC | PRN
  Administered 2021-06-09 – 2021-06-11 (×5): 1 mg via INTRAVENOUS
  Filled 2021-06-09 (×5): qty 1

## 2021-06-09 MED ORDER — LACTATED RINGERS IV SOLN: INTRAVENOUS | Status: DC | PRN

## 2021-06-09 MED ORDER — SUGAMMADEX SODIUM 200 MG/2ML IV SOLN
INTRAVENOUS | Status: DC | PRN
  Administered 2021-06-09: 200 mg via INTRAVENOUS

## 2021-06-09 MED ORDER — FENTANYL CITRATE (PF) 100 MCG/2ML IJ SOLN
INTRAMUSCULAR | Status: DC | PRN
  Administered 2021-06-09: 50 ug via INTRAVENOUS
  Administered 2021-06-09: 100 ug via INTRAVENOUS
  Administered 2021-06-09: 50 ug via INTRAVENOUS

## 2021-06-09 MED ORDER — SODIUM CHLORIDE 0.9 % IV SOLN
INTRAVENOUS | Status: DC | PRN
Start: 2021-06-09 — End: 2021-06-09

## 2021-06-09 MED ORDER — LIDOCAINE HCL (PF) 2 % IJ SOLN
INTRAMUSCULAR | Status: AC
  Filled 2021-06-09: qty 5

## 2021-06-09 MED ORDER — FENTANYL CITRATE PF 50 MCG/ML IJ SOSY: 25.0000 ug | PREFILLED_SYRINGE | INTRAMUSCULAR | Status: DC | PRN

## 2021-06-09 MED ORDER — STERILE WATER FOR IRRIGATION IR SOLN
Status: DC | PRN
  Administered 2021-06-09: 3000 mL

## 2021-06-09 MED ORDER — PHENYLEPHRINE HCL (PRESSORS) 10 MG/ML IV SOLN
INTRAVENOUS | Status: AC
  Filled 2021-06-09: qty 2

## 2021-06-09 MED ORDER — EPHEDRINE 5 MG/ML INJ
INTRAVENOUS | Status: AC
  Filled 2021-06-09: qty 5

## 2021-06-09 MED ORDER — PHENYLEPHRINE 40 MCG/ML (10ML) SYRINGE FOR IV PUSH (FOR BLOOD PRESSURE SUPPORT)
PREFILLED_SYRINGE | INTRAVENOUS | Status: DC | PRN
  Administered 2021-06-09 (×2): 80 ug via INTRAVENOUS

## 2021-06-09 MED ORDER — MUPIROCIN 2 % EX OINT
1.0000 "application " | TOPICAL_OINTMENT | Freq: Two times a day (BID) | CUTANEOUS | Status: DC
  Administered 2021-06-09 – 2021-06-10 (×3): 1 via NASAL
  Filled 2021-06-09: qty 22

## 2021-06-09 MED ORDER — LIDOCAINE HCL (CARDIAC) PF 100 MG/5ML IV SOSY
PREFILLED_SYRINGE | INTRAVENOUS | Status: DC | PRN
  Administered 2021-06-09: 60 mg via INTRAVENOUS

## 2021-06-09 MED ORDER — DEXTROSE 50 % IV SOLN
INTRAVENOUS | Status: AC
  Administered 2021-06-09: 25 mL
  Filled 2021-06-09: qty 50

## 2021-06-09 MED ORDER — ONDANSETRON HCL 4 MG PO TABS
4.0000 mg | ORAL_TABLET | Freq: Four times a day (QID) | ORAL | Status: DC | PRN
  Administered 2021-06-10: 4 mg via ORAL
  Filled 2021-06-09: qty 1

## 2021-06-09 MED ORDER — PROPOFOL 10 MG/ML IV BOLUS
INTRAVENOUS | Status: AC
  Filled 2021-06-09: qty 20

## 2021-06-09 MED ORDER — EPHEDRINE SULFATE-NACL 50-0.9 MG/10ML-% IV SOSY
PREFILLED_SYRINGE | INTRAVENOUS | Status: DC | PRN
  Administered 2021-06-09 (×2): 10 mg via INTRAVENOUS

## 2021-06-09 MED ORDER — ONDANSETRON HCL 4 MG/2ML IJ SOLN
INTRAMUSCULAR | Status: AC
  Filled 2021-06-09: qty 2

## 2021-06-09 MED ORDER — DEXAMETHASONE SODIUM PHOSPHATE 10 MG/ML IJ SOLN
INTRAMUSCULAR | Status: DC | PRN
  Administered 2021-06-09: 10 mg via INTRAVENOUS

## 2021-06-09 MED ORDER — ONDANSETRON HCL 4 MG/2ML IJ SOLN
INTRAMUSCULAR | Status: DC | PRN
  Administered 2021-06-09: 4 mg via INTRAVENOUS

## 2021-06-09 MED ORDER — SUCCINYLCHOLINE CHLORIDE 200 MG/10ML IV SOSY
PREFILLED_SYRINGE | INTRAVENOUS | Status: AC
  Filled 2021-06-09: qty 10

## 2021-06-09 MED ORDER — DEXAMETHASONE SODIUM PHOSPHATE 10 MG/ML IJ SOLN
INTRAMUSCULAR | Status: AC
  Filled 2021-06-09: qty 1

## 2021-06-09 MED ORDER — ONDANSETRON HCL 4 MG/2ML IJ SOLN
4.0000 mg | Freq: Four times a day (QID) | INTRAMUSCULAR | Status: DC | PRN
  Administered 2021-06-09 – 2021-06-11 (×4): 4 mg via INTRAVENOUS
  Filled 2021-06-09 (×4): qty 2

## 2021-06-09 MED ORDER — PROPOFOL 10 MG/ML IV BOLUS
INTRAVENOUS | Status: DC | PRN
  Administered 2021-06-09: 100 mg via INTRAVENOUS

## 2021-06-09 MED ORDER — ROCURONIUM BROMIDE 10 MG/ML (PF) SYRINGE
PREFILLED_SYRINGE | INTRAVENOUS | Status: DC | PRN
  Administered 2021-06-09: 50 mg via INTRAVENOUS

## 2021-06-09 MED ORDER — ONDANSETRON HCL 4 MG/2ML IJ SOLN: 4.0000 mg | Freq: Once | INTRAMUSCULAR | Status: DC | PRN

## 2021-06-09 MED ORDER — SUCCINYLCHOLINE CHLORIDE 200 MG/10ML IV SOSY
PREFILLED_SYRINGE | INTRAVENOUS | Status: DC | PRN
  Administered 2021-06-09: 100 mg via INTRAVENOUS

## 2021-06-09 SURGICAL SUPPLY — 60 items
BAG COUNTER SPONGE SURGICOUNT (BAG) IMPLANT
BAG DECANTER FOR FLEXI CONT (MISCELLANEOUS) ×3 IMPLANT
BAG URINE DRAIN 2000ML AR STRL (UROLOGICAL SUPPLIES) ×3 IMPLANT
BAG URO CATCHER STRL LF (MISCELLANEOUS) ×3 IMPLANT
BLADE SURG 15 STRL LF DISP TIS (BLADE) ×4 IMPLANT
BLADE SURG 15 STRL SS (BLADE) ×6
BNDG GAUZE ELAST 4 BULKY (GAUZE/BANDAGES/DRESSINGS) ×3 IMPLANT
CATH FOLEY 2WAY SLVR  5CC 14FR (CATHETERS)
CATH FOLEY 2WAY SLVR 5CC 14FR (CATHETERS) IMPLANT
CATH INTERMIT  6FR 70CM (CATHETERS) ×3 IMPLANT
CATH SILICONE 14FRX5CC (CATHETERS) ×3 IMPLANT
CLOTH BEACON ORANGE TIMEOUT ST (SAFETY) ×3 IMPLANT
COVER MAYO STAND STRL (DRAPES) ×3 IMPLANT
DECANTER SPIKE VIAL GLASS SM (MISCELLANEOUS) ×9 IMPLANT
DERMABOND ADVANCED (GAUZE/BANDAGES/DRESSINGS) ×2
DERMABOND ADVANCED .7 DNX12 (GAUZE/BANDAGES/DRESSINGS) ×4 IMPLANT
DISSECTOR ROUND CHERRY 3/8 STR (MISCELLANEOUS) ×3 IMPLANT
DRAPE SHEET LG 3/4 BI-LAMINATE (DRAPES) IMPLANT
DRAPE UNDERBUTTOCKS STRL (DISPOSABLE) ×3 IMPLANT
DRSG TEGADERM 4X4.75 (GAUZE/BANDAGES/DRESSINGS) ×6 IMPLANT
DRSG TELFA 3X8 NADH (GAUZE/BANDAGES/DRESSINGS) ×3 IMPLANT
ELECT REM PT RETURN 15FT ADLT (MISCELLANEOUS) ×3 IMPLANT
EVACUATOR DRAINAGE 7X20 100CC (MISCELLANEOUS) ×4 IMPLANT
EVACUATOR SILICONE 100CC (MISCELLANEOUS) ×6
GAUZE 4X4 16PLY ~~LOC~~+RFID DBL (SPONGE) ×6 IMPLANT
GAUZE SPONGE 4X4 12PLY STRL (GAUZE/BANDAGES/DRESSINGS) ×3 IMPLANT
GLOVE SURG ENC MOIS LTX SZ6.5 (GLOVE) ×3 IMPLANT
GLOVE SURG ENC MOIS LTX SZ7.5 (GLOVE) ×3 IMPLANT
GLOVE SURG ENC TEXT LTX SZ7.5 (GLOVE) ×3 IMPLANT
GOWN STRL REUS W/TWL LRG LVL3 (GOWN DISPOSABLE) ×6 IMPLANT
GOWN STRL REUS W/TWL XL LVL3 (GOWN DISPOSABLE) ×3 IMPLANT
GUIDEWIRE STR DUAL SENSOR (WIRE) ×3 IMPLANT
KIT BASIN OR (CUSTOM PROCEDURE TRAY) ×3 IMPLANT
KIT TURNOVER KIT A (KITS) ×3 IMPLANT
LOOP VESSEL MAXI BLUE (MISCELLANEOUS) ×3 IMPLANT
MANIFOLD NEPTUNE II (INSTRUMENTS) ×3 IMPLANT
PACK CYSTO (CUSTOM PROCEDURE TRAY) ×3 IMPLANT
PANTS MESH DISP LRG (UNDERPADS AND DIAPERS) ×2 IMPLANT
PANTS MESH DISPOSABLE L (UNDERPADS AND DIAPERS) ×1
PENCIL SMOKE EVACUATOR (MISCELLANEOUS) IMPLANT
PLUG CATH AND CAP STER (CATHETERS) ×6 IMPLANT
PROTECTOR NERVE ULNAR (MISCELLANEOUS) ×3 IMPLANT
SHEET LAVH (DRAPES) ×3 IMPLANT
SPONGE T-LAP 18X18 ~~LOC~~+RFID (SPONGE) ×3 IMPLANT
SUT CHROMIC 3 0 SH 27 (SUTURE) ×6 IMPLANT
SUT MNCRL AB 4-0 PS2 18 (SUTURE) ×6 IMPLANT
SUT SILK 0 FSL (SUTURE) ×3 IMPLANT
SUT SILK 2 0 30  PSL (SUTURE) ×3
SUT SILK 2 0 30 PSL (SUTURE) ×2 IMPLANT
SUT VIC AB 0 CT1 27 (SUTURE) ×3
SUT VIC AB 0 CT1 27XBRD ANTBC (SUTURE) ×2 IMPLANT
SUT VIC AB 3-0 SH 27 (SUTURE) ×9
SUT VIC AB 3-0 SH 27X BRD (SUTURE) ×6 IMPLANT
SYR 10ML LL (SYRINGE) ×6 IMPLANT
SYR 20ML LL LF (SYRINGE) ×3 IMPLANT
SYR 30ML LL (SYRINGE) ×3 IMPLANT
TOWEL OR 17X26 10 PK STRL BLUE (TOWEL DISPOSABLE) ×3 IMPLANT
TOWEL OR NON WOVEN STRL DISP B (DISPOSABLE) ×3 IMPLANT
TUBING CONNECTING 10 (TUBING) ×3 IMPLANT
TUBING UROLOGY SET (TUBING) IMPLANT

## 2021-06-09 NOTE — Progress Notes (Signed)
PROGRESS NOTE    [name]  QPR:[id number] DOB: 1935/03/31 DOA: 06/04/2021 PCP: Celene Squibb, MD   Brief Narrative:   [name] is a 86 y.o. male with medical history significant of CAD, HLD, DM, HTN, CKD, B12 deficiency, mantle cell lymphoma of the colon, GERD, PVD, prostate CA, OSA not on CPAP who notably positive for covid on 8/27 and completed treatment previously. Admitted for Sepsis 2/2 UTI with concurrent bacteremia.  Assessment & Plan:   Severe Sepsis (POA) secondary to E. coli bacteremia related to UTI with noted AKI -Continue on current antibiotic treatment with Rocephin -Continue IV fluid and monitor repeat labs -Urine culture with findings of E coli infection  -Repeat blood cultures no growth to date  Acute metabolic encephalopathy secondary to above in the setting of mild cognitive impairment -Discontinue sedating agents such as gabapentin and temazepam -Ativan only as needed for agitation -CT head with no acute findings  Acute urinary retention Penile edema - Foley placed - Urology following - appreciate insight/recs - Likely to need cystoscopy and artificial sphincter removal; possible need for prosthesis removal (would subsequently cause incontinence/impotence however). - flomax ordered.   AKI on CKD stage IIIb -improving -Likely prerenal in the setting of sepsis -Avoid nephrotoxic agents -reduced IV fluid hydration -Monitor strict I's and O's  Acute hypoxemic respiratory failure, resolved -Likely secondary to mental status and severe sepsis, resolved  COVID-19 positive without acute infection -Patient is well outside the window for acute COVID infection given positive on 05/19/2021 status posttreatment with paxlovid -no longer on steroids  Thrombocytopenia-acute on chronic -Likely secondary to sepsis -Continue monitor with repeat CBC and discontinue aspirin and Lovenox  Type 2 diabetes with hypoglycemia uncontrolled with  hyperglycemia -A1c 9.2% -SSI adjusted to every 4 dosing -Hold home glipizide -Decreased Lantus insulin 25u due to ongoing hypoglycemia - improving CBG (last 3)  Recent Labs    06/08/21 1634 06/08/21 2118 06/08/21 2141  GLUCAP 133* 62* 75    Hypertension - better controlled -resume home BP medications -IV as needed labetalol  History of CAD -resume home statin and aspirin  History of mantle cell lymphoma -Has not seen hematology since 2019   DVT prophylaxis: SCDs - lovenox held periprocedure Code Status: Full Family Communication: daughter/son telephone 9/15, 9/16  Disposition Plan: home with [facility]  Status is: Inpatient  Remains inpatient appropriate because:Hemodynamically unstable, Altered mental status, IV treatments appropriate due to intensity of illness or inability to take PO, and Inpatient level of care appropriate due to severity of illness  Dispo: The patient is from: Home              Anticipated d/c is to: Home              Patient currently is not medically stable to d/c.   Difficult to place patient No  Consultants:  None  Procedures:  See below  Antimicrobials:  Anti-infectives (From admission, onward)    Start     Dose/Rate Route Frequency Ordered Stop   06/08/21 0000  cephALEXin (KEFLEX) 500 MG capsule        500 mg Oral 3 times daily 06/08/21 1715 06/14/21 2359   06/06/21 1400  vancomycin (VANCOREADY) IVPB 1750 mg/350 mL  Status:  Discontinued        1,750 mg 175 mL/hr over 120 Minutes Intravenous Every 48 hours 06/04/21 1408 06/04/21 1745   06/05/21 2200  cefTRIAXone (ROCEPHIN) 2 g in sodium chloride 0.9 % 100 mL IVPB  2 g 200 mL/hr over 30 Minutes Intravenous Every 24 hours 06/05/21 0914     06/05/21 1000  ceFEPIme (MAXIPIME) 2 g in sodium chloride 0.9 % 100 mL IVPB  Status:  Discontinued        2 g 200 mL/hr over 30 Minutes Intravenous Every 24 hours 06/04/21 1408 06/04/21 1859   06/05/21 0800  ceFEPIme (MAXIPIME) 2 g in sodium  chloride 0.9 % 100 mL IVPB  Status:  Discontinued        2 g 200 mL/hr over 30 Minutes Intravenous Every 24 hours 06/05/21 0519 06/05/21 0913   06/04/21 2130  cefTRIAXone (ROCEPHIN) 2 g in sodium chloride 0.9 % 100 mL IVPB        2 g 200 mL/hr over 30 Minutes Intravenous  Once 06/04/21 2117 06/04/21 2218   06/04/21 1300  vancomycin (VANCOREADY) IVPB 2000 mg/400 mL        2,000 mg 200 mL/hr over 120 Minutes Intravenous  Once 06/04/21 1236 06/04/21 1729   06/04/21 1230  ceFEPIme (MAXIPIME) 2 g in sodium chloride 0.9 % 100 mL IVPB        2 g 200 mL/hr over 30 Minutes Intravenous  Once 06/04/21 1223 06/04/21 1341   06/04/21 1230  metroNIDAZOLE (FLAGYL) IVPB 500 mg        500 mg 100 mL/hr over 60 Minutes Intravenous  Once 06/04/21 1223 06/04/21 1516   06/04/21 1230  vancomycin (VANCOCIN) IVPB 1000 mg/200 mL premix  Status:  Discontinued        1,000 mg 200 mL/hr over 60 Minutes Intravenous  Once 06/04/21 1223 06/04/21 1236       Subjective: Patient reports penile pain after foley removal.      Objective: Vitals:   06/08/21 2023 06/08/21 2124 06/08/21 2242 06/09/21 0259  BP:  (!) 141/98 (!) 147/84 (!) 155/95  Pulse:  (!) 58 (!) 58 (!) 59  Resp:  19 16 16   Temp:  97.9 F (36.6 C) (!) 97.5 F (36.4 C) 98 F (36.7 C)  TempSrc:  Oral Oral Oral  SpO2: 95% 95% 96% 96%  Weight:      Height:        Intake/Output Summary (Last 24 hours) at 06/09/2021 0719 Last data filed at 06/08/2021 1825 Gross per 24 hour  Intake 480 ml  Output 250 ml  Net 230 ml    Filed Weights   06/04/21 1111 06/05/21 1600 06/06/21 0400  Weight: 100 kg 101.9 kg 102.4 kg    Examination:  General exam: awake, alert, confused, NAD, cooperative.  Respiratory system: speaking full sentences while on bipap, no increased work of breathing. Cardiovascular system: normal S1 & S2 heard.   Gastrointestinal system: Abdomen is soft, ND/NT, no HSM.  Central nervous system: nonfocal.  GU: penile swollen and red and  tender to palpation Extremities: no C/C/E Skin: no gross lesions seen.  Psychiatry: confused at times, normal affect.    Data Reviewed: I have personally reviewed following labs and imaging studies  CBC: Recent Labs  Lab 06/04/21 1239 06/05/21 0705 06/06/21 0444 06/06/21 2143 06/07/21 0438 06/08/21 0838 06/09/21 0455  WBC 6.0 7.3 8.1  --  8.2 6.0 4.6  NEUTROABS 5.0 6.5  --   --  7.3  --  3.4  HGB 15.7 13.8 14.1 14.0 13.5 15.3 13.6  HCT 48.1 43.8 44.2 43.5 41.8 46.3 40.7  MCV 92.0 94.2 93.4  --  92.7 92.2 89.5  PLT 64* 55* 67*  --  91* 122*  114*    Basic Metabolic Panel: Recent Labs  Lab 06/04/21 1239 06/05/21 0705 06/06/21 0444 06/07/21 0438 06/08/21 0838  NA 134* 139 143 141 140  K 4.4 4.4 3.9 3.5 3.9  CL 105 107 108 110 106  CO2 19* 23 26 25 24   GLUCOSE 275* 306* 196* 84 191*  BUN 46* 48* 55* 60* 55*  CREATININE 2.07* 2.21* 1.95* 1.73* 1.72*  CALCIUM 8.0* 7.5* 8.0* 7.6* 8.1*  MG  --   --  1.8 1.7  --     GFR: Estimated Creatinine Clearance: 39.6 mL/min (A) (by C-G formula based on SCr of 1.72 mg/dL (H)). Liver Function Tests: Recent Labs  Lab 06/04/21 1239 06/05/21 0705  AST 21 23  ALT 19 17  ALKPHOS 55 43  BILITOT 2.3* 1.1  PROT 6.8 5.3*  ALBUMIN 3.9 2.8*    No results for input(s): LIPASE, AMYLASE in the last 168 hours. No results for input(s): AMMONIA in the last 168 hours. Coagulation Profile: Recent Labs  Lab 06/04/21 1239  INR 1.1    Cardiac Enzymes: No results for input(s): CKTOTAL, CKMB, CKMBINDEX, TROPONINI in the last 168 hours. BNP (last 3 results) No results for input(s): PROBNP in the last 8760 hours. HbA1C: No results for input(s): HGBA1C in the last 72 hours.  CBG: Recent Labs  Lab 06/08/21 0833 06/08/21 1142 06/08/21 1634 06/08/21 2118 06/08/21 2141  GLUCAP 189* 169* 133* 62* 75    Lipid Profile: No results for input(s): CHOL, HDL, LDLCALC, TRIG, CHOLHDL, LDLDIRECT in the last 72 hours. Thyroid Function  Tests: No results for input(s): TSH, T4TOTAL, FREET4, T3FREE, THYROIDAB in the last 72 hours.  Anemia Panel: No results for input(s): VITAMINB12, FOLATE, FERRITIN, TIBC, IRON, RETICCTPCT in the last 72 hours. Sepsis Labs: Recent Labs  Lab 06/04/21 2132 06/04/21 2315 06/05/21 0458 06/05/21 0705 06/05/21 0915  PROCALCITON  --   --  6.72  --   --   LATICACIDVEN 1.5 1.5  --  1.5 1.0     Recent Results (from the past 240 hour(s))  Resp Panel by RT-PCR (Flu A&B, Covid) Nasopharyngeal Swab     Status: Abnormal   Collection Time: 06/04/21 12:23 PM   Specimen: Nasopharyngeal Swab; Nasopharyngeal(NP) swabs in vial transport medium  Result Value Ref Range Status   SARS Coronavirus 2 by RT PCR POSITIVE (A) NEGATIVE Final    Comment: CRITICAL RESULT CALLED TO, READ BACK BY AND VERIFIED WITH: CRAWFORD,H AT 1344 ON 9.12.22 BY RUCINSKI, B (NOTE) SARS-CoV-2 target nucleic acids are DETECTED.  The SARS-CoV-2 RNA is generally detectable in upper respiratory specimens during the acute phase of infection. Positive results are indicative of the presence of the identified virus, but do not rule out bacterial infection or co-infection with other pathogens not detected by the test. Clinical correlation with patient history and other diagnostic information is necessary to determine patient infection status. The expected result is Negative.  Fact Sheet for Patients: EntrepreneurPulse.com.au  Fact Sheet for Healthcare Providers: IncredibleEmployment.be  This test is not yet approved or cleared by the [place] FDA and  has been authorized for detection and/or diagnosis of SARS-CoV-2 by FDA under an Emergency Use Authorization (EUA).  This EUA will remain in effect (mean ing this test can be used) for the duration of  the COVID-19 declaration under Section 564(b)(1) of the Act, 21 U.S.C. section 360bbb-3(b)(1), unless the authorization is terminated or  revoked sooner.     Influenza A by PCR NEGATIVE NEGATIVE Final  Influenza B by PCR NEGATIVE NEGATIVE Final    Comment: (NOTE) The Xpert Xpress SARS-CoV-2/FLU/RSV plus assay is intended as an aid in the diagnosis of influenza from Nasopharyngeal swab specimens and should not be used as a sole basis for treatment. Nasal washings and aspirates are unacceptable for Xpert Xpress SARS-CoV-2/FLU/RSV testing.  Fact Sheet for Patients: EntrepreneurPulse.com.au  Fact Sheet for Healthcare Providers: IncredibleEmployment.be  This test is not yet approved or cleared by the [place] FDA and has been authorized for detection and/or diagnosis of SARS-CoV-2 by FDA under an Emergency Use Authorization (EUA). This EUA will remain in effect (meaning this test can be used) for the duration of the COVID-19 declaration under Section 564(b)(1) of the Act, 21 U.S.C. section 360bbb-3(b)(1), unless the authorization is terminated or revoked.  Performed at [facility], [address] [place], [place] [address]   Blood Culture (routine x 2)     Status: Abnormal   Collection Time: 06/04/21 12:39 PM   Specimen: BLOOD LEFT HAND  Result Value Ref Range Status   Specimen Description   Final    BLOOD LEFT HAND BOTTLES DRAWN AEROBIC AND ANAEROBIC Performed at [facility], [address] [place],  [address]    Special Requests   Final    Blood Culture adequate volume Performed at [name], [address], [place], [place] [address]    Culture  Setup Time   Final    ANAEROBIC BOTTLE ONLY GRAM NEGATIVE RODS Gram Stain Report Called to,Read Back By and Verified With: Belton,k@0044  by Matthews,b 9.13.22 CRITICAL RESULT CALLED TO, READ BACK BY AND VERIFIED WITH: RN CHRISTY DAWSON 06/05/21@3 :57 BY TW  Performed at [facility], [place] [address], [place], [place] [address]    Culture ESCHERICHIA COLI (A)  Final   Report Status 06/07/2021 FINAL  Final    Organism ID, Bacteria ESCHERICHIA COLI  Final      Susceptibility   Escherichia coli - MIC*    AMPICILLIN >=32 RESISTANT Resistant     CEFAZOLIN <=4 SENSITIVE Sensitive     CEFEPIME <=0.12 SENSITIVE Sensitive     CEFTAZIDIME <=1 SENSITIVE Sensitive     CEFTRIAXONE <=0.25 SENSITIVE Sensitive     CIPROFLOXACIN <=0.25 SENSITIVE Sensitive     GENTAMICIN <=1 SENSITIVE Sensitive     IMIPENEM <=0.25 SENSITIVE Sensitive     TRIMETH/SULFA <=20 SENSITIVE Sensitive     AMPICILLIN/SULBACTAM >=32 RESISTANT Resistant     PIP/TAZO <=4 SENSITIVE Sensitive     * ESCHERICHIA COLI  Blood Culture ID Panel (Reflexed)     Status: Abnormal   Collection Time: 06/04/21 12:39 PM  Result Value Ref Range Status   Enterococcus faecalis NOT DETECTED NOT DETECTED Final   Enterococcus Faecium NOT DETECTED NOT DETECTED Final   Listeria monocytogenes NOT DETECTED NOT DETECTED Final   Staphylococcus species NOT DETECTED NOT DETECTED Final   Staphylococcus aureus (BCID) NOT DETECTED NOT DETECTED Final   Staphylococcus epidermidis NOT DETECTED NOT DETECTED Final   Staphylococcus lugdunensis NOT DETECTED NOT DETECTED Final   Streptococcus species NOT DETECTED NOT DETECTED Final   Streptococcus agalactiae NOT DETECTED NOT DETECTED Final   Streptococcus pneumoniae NOT DETECTED NOT DETECTED Final   Streptococcus pyogenes NOT DETECTED NOT DETECTED Final   A.calcoaceticus-baumannii NOT DETECTED NOT DETECTED Final   Bacteroides fragilis NOT DETECTED NOT DETECTED Final   Enterobacterales DETECTED (A) NOT DETECTED Final    Comment: Enterobacterales represent a large order of gram negative bacteria, not a single organism. CRITICAL RESULT CALLED  TO, READ BACK BY AND VERIFIED WITH: RN CHRISTY DAWSON 06/05/21@3 :57 BY TW    Enterobacter cloacae complex NOT DETECTED NOT DETECTED Final   Escherichia coli DETECTED (A) NOT DETECTED Final    Comment: CRITICAL RESULT CALLED TO, READ BACK BY AND VERIFIED WITH: RN CHRISTY DAWSON  06/05/21@3 :64 BY TW    Klebsiella aerogenes NOT DETECTED NOT DETECTED Final   Klebsiella oxytoca NOT DETECTED NOT DETECTED Final   Klebsiella pneumoniae NOT DETECTED NOT DETECTED Final   Proteus species NOT DETECTED NOT DETECTED Final   Salmonella species NOT DETECTED NOT DETECTED Final   Serratia marcescens NOT DETECTED NOT DETECTED Final   Haemophilus influenzae NOT DETECTED NOT DETECTED Final   Neisseria meningitidis NOT DETECTED NOT DETECTED Final   Pseudomonas aeruginosa NOT DETECTED NOT DETECTED Final   Stenotrophomonas maltophilia NOT DETECTED NOT DETECTED Final   Candida albicans NOT DETECTED NOT DETECTED Final   Candida auris NOT DETECTED NOT DETECTED Final   Candida glabrata NOT DETECTED NOT DETECTED Final   Candida krusei NOT DETECTED NOT DETECTED Final   Candida parapsilosis NOT DETECTED NOT DETECTED Final   Candida tropicalis NOT DETECTED NOT DETECTED Final   Cryptococcus neoformans/gattii NOT DETECTED NOT DETECTED Final   CTX-M ESBL NOT DETECTED NOT DETECTED Final   Carbapenem resistance IMP NOT DETECTED NOT DETECTED Final   Carbapenem resistance KPC NOT DETECTED NOT DETECTED Final   Carbapenem resistance NDM NOT DETECTED NOT DETECTED Final   Carbapenem resist OXA 48 LIKE NOT DETECTED NOT DETECTED Final   Carbapenem resistance VIM NOT DETECTED NOT DETECTED Final    Comment: Performed at [facility], [address] [place], [place] [address]  Blood Culture (routine x 2)     Status: Abnormal   Collection Time: 06/04/21 12:40 PM   Specimen: BLOOD RIGHT FOREARM  Result Value Ref Range Status   Specimen Description   Final    BLOOD RIGHT FOREARM BOTTLES DRAWN AEROBIC AND ANAEROBIC Performed at [facility], [address] [place], [place] [address]    Special Requests   Final    Blood Culture adequate volume Performed at [facility], [address] [place], [place] [address]    Culture  Setup Time   Final    ANAEROBIC BOTTLE ONLY GRAM NEGATIVE RODS Gram Stain  Report Called to,Read Back By and Verified With: SPENCE,H@[id number]  BY MATTHEWS, B 9.13.22 Performed at [facility], [address], [place], [place] [address]    Culture (A)  Final    ESCHERICHIA COLI SUSCEPTIBILITIES PERFORMED ON PREVIOUS CULTURE WITHIN THE LAST 5 DAYS. Performed at [facility], [place] [address], [place], [place] [address]    Report Status 06/07/2021 FINAL  Final  Urine Culture     Status: Abnormal   Collection Time: 06/04/21  2:00 PM   Specimen: In/Out Cath Urine  Result Value Ref Range Status   Specimen Description   Final    IN/OUT CATH URINE Performed at [facility], [address] [place], [place] [address]    Special Requests   Final    NONE Performed at [facility], [address] [place],  [address]    Culture >=100,000 COLONIES/mL ESCHERICHIA COLI (A)  Final   Report Status 06/07/2021 FINAL  Final   Organism ID, Bacteria ESCHERICHIA COLI (A)  Final      Susceptibility   Escherichia coli - MIC*    AMPICILLIN >=32 RESISTANT Resistant     CEFAZOLIN <=4 SENSITIVE Sensitive     CEFEPIME <=0.12  SENSITIVE Sensitive     CEFTRIAXONE <=0.25 SENSITIVE Sensitive     CIPROFLOXACIN <=0.25 SENSITIVE Sensitive     GENTAMICIN <=1 SENSITIVE Sensitive     IMIPENEM <=0.25 SENSITIVE Sensitive     NITROFURANTOIN <=16 SENSITIVE Sensitive     TRIMETH/SULFA <=20 SENSITIVE Sensitive     AMPICILLIN/SULBACTAM >=32 RESISTANT Resistant     PIP/TAZO <=4 SENSITIVE Sensitive     * >=100,000 COLONIES/mL ESCHERICHIA COLI  Culture, blood (routine x 2)     Status: None (Preliminary result)   Collection Time: 06/05/21 12:43 PM   Specimen: BLOOD LEFT HAND  Result Value Ref Range Status   Specimen Description BLOOD LEFT HAND  Final   Special Requests   Final    BOTTLES DRAWN AEROBIC AND ANAEROBIC Blood Culture adequate volume   Culture   Final    NO GROWTH 2 DAYS Performed at [facility], [address], [place], [place] [address]    Report Status PENDING  Incomplete   Culture, blood (routine x 2)     Status: None (Preliminary result)   Collection Time: 06/05/21 12:43 PM   Specimen: BLOOD RIGHT HAND  Result Value Ref Range Status   Specimen Description BLOOD RIGHT HAND  Final   Special Requests   Final    BOTTLES DRAWN AEROBIC AND ANAEROBIC Blood Culture results may not be optimal due to an excessive volume of blood received in culture bottles   Culture   Final    NO GROWTH 2 DAYS Performed at [facility], [address], [place], [place] [address]    Report Status PENDING  Incomplete  MRSA Next Gen by PCR, Nasal     Status: None   Collection Time: 06/05/21  4:07 PM   Specimen: Nasal Mucosa; Nasal Swab  Result Value Ref Range Status   MRSA by PCR Next Gen NOT DETECTED NOT DETECTED Final    Comment: (NOTE) The GeneXpert MRSA Assay (FDA approved for NASAL specimens only), is one component of a comprehensive MRSA colonization surveillance program. It is not intended to diagnose MRSA infection nor to guide or monitor treatment for MRSA infections. Test performance is not FDA approved in patients less than 57 years old. Performed at [facility], [address], [place], [place] [address]   Surgical PCR screen     Status: None   Collection Time: 06/09/21  5:03 AM   Specimen: Nasal Mucosa; Nasal Swab  Result Value Ref Range Status   MRSA, PCR NEGATIVE NEGATIVE Final   Staphylococcus aureus NEGATIVE NEGATIVE Final    Comment: (NOTE) The Xpert SA Assay (FDA approved for NASAL specimens in patients 52 years of age and older), is one component of a comprehensive surveillance program. It is not intended to diagnose infection nor to guide or monitor treatment. Performed at [facility], [place] [address], [place], [place] [address]      Radiology Studies: No results found.  Scheduled Meds:  amLODipine  5 mg Oral Daily   aspirin EC  81 mg Oral Daily   atorvastatin  20 mg Oral Daily   chlorhexidine  15 mL Mouth Rinse BID    Chlorhexidine Gluconate Cloth  6 each Topical Daily   dexamethasone (DECADRON) injection  6 mg Intravenous Q24H   insulin aspart  0-15 Units Subcutaneous TID WC   insulin glargine-yfgn  25 Units Subcutaneous BH-q7a   ipratropium  2 spray Each Nare BID   mouth rinse  15 mL Mouth Rinse q12n4p   mometasone-formoterol  2  puff Inhalation BID   mupirocin ointment  1 application Nasal BID   pantoprazole  40 mg Oral Q0600   propranolol ER  80 mg Oral QHS   tamsulosin  0.4 mg Oral QPC supper   Continuous Infusions:  cefTRIAXone (ROCEPHIN)  IV 2 g (06/08/21 1555)   lactated ringers 50 mL/hr at 06/09/21 0511     LOS: 4 days   Time spent: 35 minutes  Little Ishikawa, MD How to contact the [facility] Attending or Consulting provider [place] or covering provider during after hours [place], for this patient?  Check the care team in [facility] and look for a) attending/consulting [facility] provider listed and b) the [facility] team listed Log into [email or web address] and use [place]'s universal password to access. If you do not have the password, please contact the hospital operator. Locate the [facility] provider you are looking for under [facility] and page to a number that you can be directly reached. If you still have difficulty reaching the provider, please page the [facility] (Director on Call) for the Hospitalists listed on amion for assistance.   If 7PM-7AM, please contact night-coverage [email or web address] 06/09/2021, 7:19 AM

## 2021-06-09 NOTE — Op Note (Signed)
Operative Note  Preoperative diagnosis:  1.  Eroded and possibly infected artificial urinary sphincter and inflatable penile prosthesis with urethral injury  Postoperative diagnosis: 1.  Same  Procedure(s): 1.  Cystoscopy 2.  Removal of artificial urinary sphincter 3.  Removal of 3 piece inflatable penile prosthesis  Surgeon: Link Snuffer, MD  Co-surgeon: Bjorn Loser, MD--due to the complexity of the case and requirement for retraction and complex assistance a co-surgeon was necessary.  Anesthesia: General  Complications: None immediate  EBL: 50 cc  Specimens: 1.  None  Drains/Catheters: 1.  33 French Foley catheter 2.  JP drain x2, one in the scrotum, 1 in the perineum  Intraoperative findings: 1.  Inflamed pendulous urethra consistent with known history of a pendulous urethral stricture but easily advanced the scope up to the sphincter.  The sphincter was again deactivated.  Possible erosion seen and significant inflammatory change. 2.  Obvious urine within the perineum and scrotum.  Did not see an obvious urethral injury.  May have been more anterior.  Successful explant of all hardware  Indication: 86 year old male admitted with UTI with artificial urinary sphincter had multiple catheterizations.  He then noted to have onset of swelling and ecchymosis.  Cystoscopy at the bedside revealed/confirmed likely erosion and damage to the device.  Swelling progressed concerning for erosion, urethral injury, and possible infection.  Decision was made to proceed to the operating room for the above operation.  Description of procedure:  The patient was identified and consent was obtained.  The patient was taken to the operating room and placed in the supine position.  The patient was placed under general anesthesia.  Perioperative antibiotics were administered.  The patient was placed in high dorsal lithotomy.  Patient was prepped and draped in a standard sterile fashion and a  timeout was performed.  A 21 French rigid cystoscope was advanced into the urethra up to the sphincter.  Findings are noted above.  The sphincter was deactivated and a 77 French Foley catheter was placed easily.  This drained clear urine.  We then made a perineal incision and carried this down with electrocautery through Cole's fascia and palpated the cuff.  On cut settings the capsule around the cuff was incised and the cuff was fully exposed.  This was released and easily removed from around the urethra.  We inspected the urethra and did not see an obvious urethral injury.  However, there was clearly urine within the scrotum and perineum.  No obvious purulence.  Cut the tubing and passed off the cuff.  A Kelly was placed around the tubing.  We turned our attention to the scrotum.  A longitudinal incision was made along the median raphae.  This was carried down with Bovie electrocautery and on cut settings incised the capsule surrounding the penile prosthesis pump.  Bilateral tubing was then carefully followed down to the corpora bilaterally.  Bilateral cylinders along with 2 cm rear tip extenders were extracted.  Care was taken not to injure the urethra.  The tubing towards the reservoir was then followed in the reservoir was extracted.  Entire device was passed off.  It was discarded.  The artificial sphincter pump was identified and the capsule was incised overlying this and the tubing was followed until the reservoir was extracted.  The remainder of the device and tubing were passed off and discarded.  All devices hardware had been removed.  There was bleeding from bilateral corpora.  The wound did not appear to be infected.  The significant swelling was likely secondary to urine accumulation.  Therefore I felt comfortable closing the corpora for hemostasis.  I closed this with a running 3-0 Vicryl bilaterally.  A drain was placed and brought out the left side.  Dartos was closed with running 3-0 Vicryl  followed by closure of the skin with running 3-0 chromic.  The drain was secured down with a silk suture.  A peritoneal drain was placed which was also brought out the left side.  This was secured down with a silk suture.  I closed the defect over the urethra with running 3-0 Vicryl.  I then closed because fascia with running 3-0 Vicryl followed by closure of the skin with running 3-0 chromic.  This concluded the operation.  Patient tolerated the procedure well and was stable postoperative.  Plan: Continue IV antibiotics for now.  He can likely be transitioned to oral antibiotic for total of 10 days after couple days.  I will continue to watch drain outputs.  He will need to keep his Foley catheter for 3 to 4 weeks to allow for urethral healing and then perform a pericatheter retrograde urethrogram.

## 2021-06-09 NOTE — Progress Notes (Signed)
Patient has been NPO since arrival to the unit. Patient's latest glucose was 115. Hospitalist paged and made aware patient had no diet orders placed since his return to the unit and that patient is a diabetic. See new orders.

## 2021-06-09 NOTE — Anesthesia Procedure Notes (Signed)
Procedure Name: Intubation Date/Time: 06/09/2021 3:35 PM Performed by: Raenette Rover, CRNA Pre-anesthesia Checklist: Patient identified, Emergency Drugs available, Suction available and Patient being monitored Patient Re-evaluated:Patient Re-evaluated prior to induction Oxygen Delivery Method: Circle system utilized Preoxygenation: Pre-oxygenation with 100% oxygen Induction Type: IV induction, Rapid sequence and Cricoid Pressure applied Laryngoscope Size: Miller and 3 Grade View: Grade I Tube type: Oral Tube size: 7.5 mm Number of attempts: 1 Airway Equipment and Method: Stylet Placement Confirmation: ETT inserted through vocal cords under direct vision, positive ETCO2 and breath sounds checked- equal and bilateral Secured at: 22 cm Tube secured with: Tape Dental Injury: Teeth and Oropharynx as per pre-operative assessment

## 2021-06-09 NOTE — Progress Notes (Signed)
Patient gave permission to speak with Storm Frisk Daughter. Update provided regarding plan of care.

## 2021-06-09 NOTE — Transfer of Care (Signed)
Immediate Anesthesia Transfer of Care Note  Patient: [name]  Procedure(s) Performed: CYSTOSCOPY (Urethra) REMOVAL OF ARTIFICIAL URINARY SPHINCER; REMOVAL OF INFLATABLE PENILE PROSTHESIS (Perineum)  Patient Location: PACU  Anesthesia Type:General  Level of Consciousness: awake, alert , oriented and patient cooperative  Airway & Oxygen Therapy: Patient Spontanous Breathing and Patient connected to face mask oxygen  Post-op Assessment: Report given to RN and Post -op Vital signs reviewed and stable  Post vital signs: Reviewed and stable  Last Vitals:  Vitals Value Taken Time  BP 150/85 06/09/21 1734  Temp    Pulse 78 06/09/21 1737  Resp 17 06/09/21 1737  SpO2 97 % 06/09/21 1737  Vitals shown include unvalidated device data.  Last Pain:  Vitals:   06/09/21 1111  TempSrc:   PainSc: 10-Worst pain ever      Patients Stated Pain Goal: 0 (18/29/93 7169)  Complications: No notable events documented.

## 2021-06-09 NOTE — Anesthesia Preprocedure Evaluation (Addendum)
Anesthesia Evaluation  Patient identified by MRN, date of birth, ID band Patient awake    Reviewed: Allergy & Precautions, NPO status , Patient's Chart, lab work & pertinent test results  Airway Mallampati: III  TM Distance: >3 FB Neck ROM: Full    Dental no notable dental hx.    Pulmonary sleep apnea ,    Pulmonary exam normal breath sounds clear to auscultation       Cardiovascular hypertension, Pt. on medications + CAD and + Peripheral Vascular Disease  Normal cardiovascular exam Rhythm:Regular Rate:Normal     Neuro/Psych negative neurological ROS  negative psych ROS   GI/Hepatic negative GI ROS, Neg liver ROS,   Endo/Other  diabetes, Insulin Dependent  Renal/GU Renal disease     Musculoskeletal negative musculoskeletal ROS (+)   Abdominal (+) + obese,   Peds  Hematology HLD   Anesthesia Other Findings Erroded artificial urinary sphincter  Reproductive/Obstetrics                            Anesthesia Physical Anesthesia Plan  ASA: 3  Anesthesia Plan: General   Post-op Pain Management:    Induction: Intravenous  PONV Risk Score and Plan: 2 and Ondansetron, Dexamethasone and Treatment may vary due to age or medical condition  Airway Management Planned: Oral ETT  Additional Equipment:   Intra-op Plan:   Post-operative Plan: Extubation in OR  Informed Consent: I have reviewed the patients History and Physical, chart, labs and discussed the procedure including the risks, benefits and alternatives for the proposed anesthesia with the patient or authorized representative who has indicated his/her understanding and acceptance.     Dental advisory given  Plan Discussed with: CRNA  Anesthesia Plan Comments:         Anesthesia Quick Evaluation

## 2021-06-09 NOTE — Interval H&P Note (Signed)
History and Physical Interval Note:  06/09/2021 3:33 PM  [name]  has presented today for surgery, with the diagnosis of erroded artificial urinary sphincter.  The various methods of treatment have been discussed with the patient and family. After consideration of risks, benefits and other options for treatment, the patient has consented to  Procedure(s): CYSTOSCOPY (N/A) REMOVAL OF ARTIFICIAL URINARY SPHINCER, POSSIBLE REMOVAL OF INFLATABLE PENILE PROSTHESIS (N/A) as a surgical intervention.  The patient's history has been reviewed, patient examined, no change in status, stable for surgery.  I have reviewed the patient's chart and labs.  Questions were answered to the patient's satisfaction.     Marton Redwood, III

## 2021-06-09 NOTE — Progress Notes (Signed)
Verbal Orders received from hospitalist provider to discontinue Covid precautions. Pt is now 21 days. Pt also c/o pain in penis and scrotum, v.o received to administer dilaudid 0.5-1 mg IV every 3 hours prn for severe pain.

## 2021-06-09 NOTE — Progress Notes (Signed)
Patients daughter has personal belongings, necklace and watch.

## 2021-06-10 ENCOUNTER — Encounter (HOSPITAL_COMMUNITY): Payer: Self-pay | Admitting: Urology

## 2021-06-10 DIAGNOSIS — N179 Acute kidney failure, unspecified: Secondary | ICD-10-CM | POA: Diagnosis not present

## 2021-06-10 LAB — CBC
HCT: 43.7 % (ref 39.0–52.0)
Hemoglobin: 14.4 g/dL (ref 13.0–17.0)
MCH: 29.8 pg (ref 26.0–34.0)
MCHC: 33 g/dL (ref 30.0–36.0)
MCV: 90.5 fL (ref 80.0–100.0)
Platelets: 142 10*3/uL — ABNORMAL LOW (ref 150–400)
RBC: 4.83 MIL/uL (ref 4.22–5.81)
RDW: 15.7 % — ABNORMAL HIGH (ref 11.5–15.5)
WBC: 6.8 10*3/uL (ref 4.0–10.5)
nRBC: 0 % (ref 0.0–0.2)

## 2021-06-10 LAB — GLUCOSE, CAPILLARY
Glucose-Capillary: 148 mg/dL — ABNORMAL HIGH (ref 70–99)
Glucose-Capillary: 197 mg/dL — ABNORMAL HIGH (ref 70–99)
Glucose-Capillary: 161 mg/dL — ABNORMAL HIGH (ref 70–99)
Glucose-Capillary: 151 mg/dL — ABNORMAL HIGH (ref 70–99)

## 2021-06-10 LAB — BASIC METABOLIC PANEL
Anion gap: 8 (ref 5–15)
BUN: 51 mg/dL — ABNORMAL HIGH (ref 8–23)
CO2: 30 mmol/L (ref 22–32)
Calcium: 8 mg/dL — ABNORMAL LOW (ref 8.9–10.3)
Chloride: 100 mmol/L (ref 98–111)
Creatinine, Ser: 1.54 mg/dL — ABNORMAL HIGH (ref 0.61–1.24)
GFR, Estimated: 44 mL/min — ABNORMAL LOW (ref >60)
Glucose, Bld: 176 mg/dL — ABNORMAL HIGH (ref 70–99)
Potassium: 4.2 mmol/L (ref 3.5–5.1)
Sodium: 138 mmol/L (ref 135–145)

## 2021-06-10 LAB — CULTURE, BLOOD (ROUTINE X 2)
Culture: NO GROWTH
Culture: NO GROWTH
Special Requests: ADEQUATE

## 2021-06-10 MED ORDER — CEPHALEXIN 500 MG PO CAPS
500.0000 mg | ORAL_CAPSULE | Freq: Four times a day (QID) | ORAL | Status: DC
  Administered 2021-06-10 – 2021-06-12 (×9): 500 mg via ORAL
  Filled 2021-06-10 (×9): qty 1

## 2021-06-10 NOTE — Progress Notes (Signed)
[name]  JQG:[id number] DOB: 1934/10/18 DOA: 06/04/2021 PCP: Celene Squibb, MD   Brief Narrative:   [name] is a 86 y.o. male with medical history significant of CAD, HLD, DM, HTN, CKD, B12 deficiency, mantle cell lymphoma of the colon, GERD, PVD, prostate CA, OSA not on CPAP who notably positive for covid on 8/27 and completed treatment previously. Admitted for Sepsis 2/2 UTI with concurrent bacteremia.  Assessment & Plan:   Severe Sepsis (POA) secondary to E. coli bacteremia related to UTI with noted AKI -Continue on current antibiotic treatment with Rocephin -Continue IV fluid and monitor repeat labs -Urine culture with findings of E coli infection  -Repeat blood cultures no growth to date  Acute metabolic encephalopathy secondary to above in the setting of mild cognitive impairment, resolved -Discontinue sedating agents such as gabapentin and temazepam -Ativan only as needed for agitation -CT head with no acute findings -Appears to be more awake alert oriented this morning likely back to baseline  Acute urinary retention Penile edema - Foley placed - Urology following - appreciate insight/recs - Status post cystoscopy with removal of artificial urinary sphincter and inflatable penile prosthesis - flomax ordered.   AKI on CKD stage IIIb -improving -Likely prerenal in the setting of sepsis -Avoid nephrotoxic agents -reduced IV fluid hydration -Monitor strict I's and O's  Acute hypoxemic respiratory failure, resolved -Likely secondary to mental status and severe sepsis, resolved  COVID-19 positive without acute infection -Patient is well outside the window for acute COVID infection given positive on 05/19/2021 status posttreatment with paxlovid -no longer on steroids  Thrombocytopenia-acute on chronic -Likely secondary to sepsis -Continue monitor with repeat CBC and discontinue aspirin and Lovenox  Type 2 diabetes with  hyperglycemia -A1c 9.2% -Continue sliding scale insulin, Lantus at 25 units daily(this is half his home dose due to previous transient hypoglycemia, now resolved) -Hold home p.o. meds  Hypertension - better controlled -resume home BP medications -IV as needed labetalol  History of CAD -resume home statin and aspirin  History of mantle cell lymphoma -Has not seen hematology since 2019  DVT prophylaxis: SCDs - lovenox held periprocedure Code Status: Full Family Communication: daughter/son telephone 9/15, 9/16  Disposition Plan: [facility] with [facility]  Status is: Inpatient  Remains inpatient appropriate because:Hemodynamically unstable, Altered mental status, IV treatments appropriate due to intensity of illness or inability to take PO, and Inpatient level of care appropriate due to severity of illness  Dispo: The patient is from: Home              Anticipated d/c is to: Home              Patient currently is not medically stable to d/c.   Difficult to place patient No  Consultants:  None  Procedures:  Cystoscopy 06/09/2021  Antimicrobials:  Anti-infectives (From admission, onward)    Start     Dose/Rate Route Frequency Ordered Stop   06/09/21 1558  sodium chloride 0.9 % with cefTRIAXone (ROCEPHIN) ADS Med       Note to Pharmacy: Dara Lords   : cabinet override      06/09/21 1558 06/09/21 1600   06/08/21 0000  cephALEXin (KEFLEX) 500 MG capsule        500 mg Oral 3 times daily 06/08/21 1715 06/14/21 2359   06/06/21 1400  vancomycin (VANCOREADY) IVPB 1750 mg/350 mL  Status:  Discontinued        1,750 mg 175 mL/hr over 120  Minutes Intravenous Every 48 hours 06/04/21 1408 06/04/21 1745   06/05/21 2200  cefTRIAXone (ROCEPHIN) 2 g in sodium chloride 0.9 % 100 mL IVPB        2 g 200 mL/hr over 30 Minutes Intravenous Every 24 hours 06/05/21 0914     06/05/21 1000  ceFEPIme (MAXIPIME) 2 g in sodium chloride 0.9 % 100 mL IVPB  Status:  Discontinued        2 g 200 mL/hr over 30 Minutes  Intravenous Every 24 hours 06/04/21 1408 06/04/21 1859   06/05/21 0800  ceFEPIme (MAXIPIME) 2 g in sodium chloride 0.9 % 100 mL IVPB  Status:  Discontinued        2 g 200 mL/hr over 30 Minutes Intravenous Every 24 hours 06/05/21 0519 06/05/21 0913   06/04/21 2130  cefTRIAXone (ROCEPHIN) 2 g in sodium chloride 0.9 % 100 mL IVPB        2 g 200 mL/hr over 30 Minutes Intravenous  Once 06/04/21 2117 06/04/21 2218   06/04/21 1300  vancomycin (VANCOREADY) IVPB 2000 mg/400 mL        2,000 mg 200 mL/hr over 120 Minutes Intravenous  Once 06/04/21 1236 06/04/21 1729   06/04/21 1230  ceFEPIme (MAXIPIME) 2 g in sodium chloride 0.9 % 100 mL IVPB        2 g 200 mL/hr over 30 Minutes Intravenous  Once 06/04/21 1223 06/04/21 1341   06/04/21 1230  metroNIDAZOLE (FLAGYL) IVPB 500 mg        500 mg 100 mL/hr over 60 Minutes Intravenous  Once 06/04/21 1223 06/04/21 1516   06/04/21 1230  vancomycin (VANCOCIN) IVPB 1000 mg/200 mL premix  Status:  Discontinued        1,000 mg 200 mL/hr over 60 Minutes Intravenous  Once 06/04/21 1223 06/04/21 1236       Subjective: Patient reports penile pain after foley removal.      Objective: Vitals:   06/09/21 1810 06/09/21 1825 06/09/21 2139 06/10/21 0339  BP:  (!) 160/90 128/88 131/87  Pulse: (!) 59 64 66 66  Resp: 11 16  16   Temp:   97.6 F (36.4 C) 97.6 F (36.4 C)  TempSrc:   Oral Oral  SpO2: 97% 100% 97% 94%  Weight:      Height:        Intake/Output Summary (Last 24 hours) at 06/10/2021 0736 Last data filed at 06/10/2021 1610 Gross per 24 hour  Intake 800 ml  Output 2390 ml  Net -1590 ml    Filed Weights   06/04/21 1111 06/05/21 1600 06/06/21 0400  Weight: 100 kg 101.9 kg 102.4 kg    Examination:  General exam: awake, alert, confused, NAD, cooperative.  Respiratory system: speaking full sentences while on bipap, no increased work of breathing. Cardiovascular system: normal S1 & S2 heard.   Gastrointestinal system: Abdomen is soft, ND/NT, no  HSM.  Central nervous system: nonfocal.  Extremities: no C/C/E Skin: no gross lesions seen.  Psychiatry: confused at times, normal affect.    Data Reviewed: I have personally reviewed following labs and imaging studies  CBC: Recent Labs  Lab 06/04/21 1239 06/05/21 0705 06/06/21 0444 06/06/21 2143 06/07/21 0438 06/08/21 0838 06/09/21 0455 06/10/21 0508  WBC 6.0 7.3 8.1  --  8.2 6.0 4.6 6.8  NEUTROABS 5.0 6.5  --   --  7.3  --  3.4  --   HGB 15.7 13.8 14.1 14.0 13.5 15.3 13.6 14.4  HCT 48.1 43.8 44.2 43.5 41.8  46.3 40.7 43.7  MCV 92.0 94.2 93.4  --  92.7 92.2 89.5 90.5  PLT 64* 55* 67*  --  91* 122* 114* 142*    Basic Metabolic Panel: Recent Labs  Lab 06/05/21 0705 06/06/21 0444 06/07/21 0438 06/08/21 0838 06/10/21 0508  NA 139 143 141 140 138  K 4.4 3.9 3.5 3.9 4.2  CL 107 108 110 106 100  CO2 23 26 25 24 30   GLUCOSE 306* 196* 84 191* 176*  BUN 48* 55* 60* 55* 51*  CREATININE 2.21* 1.95* 1.73* 1.72* 1.54*  CALCIUM 7.5* 8.0* 7.6* 8.1* 8.0*  MG  --  1.8 1.7  --   --     GFR: Estimated Creatinine Clearance: 44.3 mL/min (A) (by C-G formula based on SCr of 1.54 mg/dL (H)). Liver Function Tests: Recent Labs  Lab 06/04/21 1239 06/05/21 0705  AST 21 23  ALT 19 17  ALKPHOS 55 43  BILITOT 2.3* 1.1  PROT 6.8 5.3*  ALBUMIN 3.9 2.8*    No results for input(s): LIPASE, AMYLASE in the last 168 hours. No results for input(s): AMMONIA in the last 168 hours. Coagulation Profile: Recent Labs  Lab 06/04/21 1239  INR 1.1    Cardiac Enzymes: No results for input(s): CKTOTAL, CKMB, CKMBINDEX, TROPONINI in the last 168 hours. BNP (last 3 results) No results for input(s): PROBNP in the last 8760 hours. HbA1C: No results for input(s): HGBA1C in the last 72 hours.  CBG: Recent Labs  Lab 06/08/21 2141 06/09/21 1129 06/09/21 1223 06/09/21 1745 06/09/21 2024  GLUCAP 75 61* 105* 75 115*    Lipid Profile: No results for input(s): CHOL, HDL, LDLCALC, TRIG,  CHOLHDL, LDLDIRECT in the last 72 hours. Thyroid Function Tests: No results for input(s): TSH, T4TOTAL, FREET4, T3FREE, THYROIDAB in the last 72 hours.  Anemia Panel: No results for input(s): VITAMINB12, FOLATE, FERRITIN, TIBC, IRON, RETICCTPCT in the last 72 hours. Sepsis Labs: Recent Labs  Lab 06/04/21 2132 06/04/21 2315 06/05/21 0458 06/05/21 0705 06/05/21 0915  PROCALCITON  --   --  6.72  --   --   LATICACIDVEN 1.5 1.5  --  1.5 1.0     Recent Results (from the past 240 hour(s))  Resp Panel by RT-PCR (Flu A&B, Covid) Nasopharyngeal Swab     Status: Abnormal   Collection Time: 06/04/21 12:23 PM   Specimen: Nasopharyngeal Swab; Nasopharyngeal(NP) swabs in vial transport medium  Result Value Ref Range Status   SARS Coronavirus 2 by RT PCR POSITIVE (A) NEGATIVE Final    Comment: CRITICAL RESULT CALLED TO, READ BACK BY AND VERIFIED WITH: CRAWFORD,H AT 1344 ON 9.12.22 BY RUCINSKI, B (NOTE) SARS-CoV-2 target nucleic acids are DETECTED.  The SARS-CoV-2 RNA is generally detectable in upper respiratory specimens during the acute phase of infection. Positive results are indicative of the presence of the identified virus, but do not rule out bacterial infection or co-infection with other pathogens not detected by the test. Clinical correlation with patient history and other diagnostic information is necessary to determine patient infection status. The expected result is Negative.  Fact Sheet for Patients: EntrepreneurPulse.com.au  Fact Sheet for Healthcare Providers: IncredibleEmployment.be  This test is not yet approved or cleared by the [place] FDA and  has been authorized for detection and/or diagnosis of SARS-CoV-2 by FDA under an Emergency Use Authorization (EUA).  This EUA will remain in effect (mean ing this test can be used) for the duration of  the COVID-19 declaration under Section 564(b)(1) of the Act,  21 U.S.C. section  360bbb-3(b)(1), unless the authorization is terminated or revoked sooner.     Influenza A by PCR NEGATIVE NEGATIVE Final   Influenza B by PCR NEGATIVE NEGATIVE Final    Comment: (NOTE) The Xpert Xpress SARS-CoV-2/FLU/RSV plus assay is intended as an aid in the diagnosis of influenza from Nasopharyngeal swab specimens and should not be used as a sole basis for treatment. Nasal washings and aspirates are unacceptable for Xpert Xpress SARS-CoV-2/FLU/RSV testing.  Fact Sheet for Patients: EntrepreneurPulse.com.au  Fact Sheet for Healthcare Providers: IncredibleEmployment.be  This test is not yet approved or cleared by the [place] FDA and has been authorized for detection and/or diagnosis of SARS-CoV-2 by FDA under an Emergency Use Authorization (EUA). This EUA will remain in effect (meaning this test can be used) for the duration of the COVID-19 declaration under Section 564(b)(1) of the Act, 21 U.S.C. section 360bbb-3(b)(1), unless the authorization is terminated or revoked.  Performed at [facility], [address], [place], [place] [address]   Blood Culture (routine x 2)     Status: Abnormal   Collection Time: 06/04/21 12:39 PM   Specimen: BLOOD LEFT HAND  Result Value Ref Range Status   Specimen Description   Final    BLOOD LEFT HAND BOTTLES DRAWN AEROBIC AND ANAEROBIC Performed at [facility], [address] [place], [place] [address]    Special Requests   Final    Blood Culture adequate volume Performed at [facility], [address] [place], [place] [address]    Culture  Setup Time   Final    ANAEROBIC BOTTLE ONLY GRAM NEGATIVE RODS Gram Stain Report Called to,Read Back By and Verified With: Belton,k@[email or web address]  by Matthews,b 9.13.22 CRITICAL RESULT CALLED TO, READ BACK BY AND VERIFIED WITH: RN CHRISTY DAWSON 06/05/21@3 :57 BY TW  Performed at [facility], [place] [address] [place], [place] [address]    Culture ESCHERICHIA COLI  (A)  Final   Report Status 06/07/2021 FINAL  Final   Organism ID, Bacteria ESCHERICHIA COLI  Final      Susceptibility   Escherichia coli - MIC*    AMPICILLIN >=32 RESISTANT Resistant     CEFAZOLIN <=4 SENSITIVE Sensitive     CEFEPIME <=0.12 SENSITIVE Sensitive     CEFTAZIDIME <=1 SENSITIVE Sensitive     CEFTRIAXONE <=0.25 SENSITIVE Sensitive     CIPROFLOXACIN <=0.25 SENSITIVE Sensitive     GENTAMICIN <=1 SENSITIVE Sensitive     IMIPENEM <=0.25 SENSITIVE Sensitive     TRIMETH/SULFA <=20 SENSITIVE Sensitive     AMPICILLIN/SULBACTAM >=32 RESISTANT Resistant     PIP/TAZO <=4 SENSITIVE Sensitive     * ESCHERICHIA COLI  Blood Culture ID Panel (Reflexed)     Status: Abnormal   Collection Time: 06/04/21 12:39 PM  Result Value Ref Range Status   Enterococcus faecalis NOT DETECTED NOT DETECTED Final   Enterococcus Faecium NOT DETECTED NOT DETECTED Final   Listeria monocytogenes NOT DETECTED NOT DETECTED Final   Staphylococcus species NOT DETECTED NOT DETECTED Final   Staphylococcus aureus (BCID) NOT DETECTED NOT DETECTED Final   Staphylococcus epidermidis NOT DETECTED NOT DETECTED Final   Staphylococcus lugdunensis NOT DETECTED NOT DETECTED Final   Streptococcus species NOT DETECTED NOT DETECTED Final   Streptococcus agalactiae NOT DETECTED NOT DETECTED Final   Streptococcus pneumoniae NOT DETECTED NOT DETECTED Final   Streptococcus pyogenes NOT DETECTED NOT DETECTED Final   A.calcoaceticus-baumannii NOT DETECTED NOT DETECTED Final   Bacteroides fragilis NOT DETECTED NOT DETECTED Final  Enterobacterales DETECTED (A) NOT DETECTED Final    Comment: Enterobacterales represent a large order of gram negative bacteria, not a single organism. CRITICAL RESULT CALLED TO, READ BACK BY AND VERIFIED WITH: RN CHRISTY DAWSON 06/05/21@3 :57 BY TW    Enterobacter cloacae complex NOT DETECTED NOT DETECTED Final   Escherichia coli DETECTED (A) NOT DETECTED Final    Comment: CRITICAL RESULT CALLED TO,  READ BACK BY AND VERIFIED WITH: RN CHRISTY DAWSON 06/05/21@3 :15 BY TW    Klebsiella aerogenes NOT DETECTED NOT DETECTED Final   Klebsiella oxytoca NOT DETECTED NOT DETECTED Final   Klebsiella pneumoniae NOT DETECTED NOT DETECTED Final   Proteus species NOT DETECTED NOT DETECTED Final   Salmonella species NOT DETECTED NOT DETECTED Final   Serratia marcescens NOT DETECTED NOT DETECTED Final   Haemophilus influenzae NOT DETECTED NOT DETECTED Final   Neisseria meningitidis NOT DETECTED NOT DETECTED Final   Pseudomonas aeruginosa NOT DETECTED NOT DETECTED Final   Stenotrophomonas maltophilia NOT DETECTED NOT DETECTED Final   Candida albicans NOT DETECTED NOT DETECTED Final   Candida auris NOT DETECTED NOT DETECTED Final   Candida glabrata NOT DETECTED NOT DETECTED Final   Candida krusei NOT DETECTED NOT DETECTED Final   Candida parapsilosis NOT DETECTED NOT DETECTED Final   Candida tropicalis NOT DETECTED NOT DETECTED Final   Cryptococcus neoformans/gattii NOT DETECTED NOT DETECTED Final   CTX-M ESBL NOT DETECTED NOT DETECTED Final   Carbapenem resistance IMP NOT DETECTED NOT DETECTED Final   Carbapenem resistance KPC NOT DETECTED NOT DETECTED Final   Carbapenem resistance NDM NOT DETECTED NOT DETECTED Final   Carbapenem resist OXA 48 LIKE NOT DETECTED NOT DETECTED Final   Carbapenem resistance VIM NOT DETECTED NOT DETECTED Final    Comment: Performed at [facility], [address], [place], [place] [address]  Blood Culture (routine x 2)     Status: Abnormal   Collection Time: 06/04/21 12:40 PM   Specimen: BLOOD RIGHT FOREARM  Result Value Ref Range Status   Specimen Description   Final    BLOOD RIGHT FOREARM BOTTLES DRAWN AEROBIC AND ANAEROBIC Performed at [facility], [address], [place], [place] [address]    Special Requests   Final    Blood Culture adequate volume Performed at [facility], [address] [place], [place] [address]    Culture  Setup Time   Final     ANAEROBIC BOTTLE ONLY GRAM NEGATIVE RODS Gram Stain Report Called to,Read Back By and Verified With: SPENCE,[id number]  BY MATTHEWS, B 9.13.22 Performed at [facility], [address], [place], [place] [address]    Culture (A)  Final    ESCHERICHIA COLI SUSCEPTIBILITIES PERFORMED ON PREVIOUS CULTURE WITHIN THE LAST 5 DAYS. Performed at [facility], [place] [address] [place], [place] [address]    Report Status 06/07/2021 FINAL  Final  Urine Culture     Status: Abnormal   Collection Time: 06/04/21  2:00 PM   Specimen: In/Out Cath Urine  Result Value Ref Range Status   Specimen Description   Final    IN/OUT CATH URINE Performed at [facility], [address], [place], [place] [address]    Special Requests   Final    NONE Performed at [facility], [address] [place],  [address]    Culture >=100,000 COLONIES/mL ESCHERICHIA COLI (A)  Final   Report Status 06/07/2021 FINAL  Final   Organism ID, Bacteria ESCHERICHIA COLI (A)  Final      Susceptibility  Escherichia coli - MIC*    AMPICILLIN >=32 RESISTANT Resistant     CEFAZOLIN <=4 SENSITIVE Sensitive     CEFEPIME <=0.12 SENSITIVE Sensitive     CEFTRIAXONE <=0.25 SENSITIVE Sensitive     CIPROFLOXACIN <=0.25 SENSITIVE Sensitive     GENTAMICIN <=1 SENSITIVE Sensitive     IMIPENEM <=0.25 SENSITIVE Sensitive     NITROFURANTOIN <=16 SENSITIVE Sensitive     TRIMETH/SULFA <=20 SENSITIVE Sensitive     AMPICILLIN/SULBACTAM >=32 RESISTANT Resistant     PIP/TAZO <=4 SENSITIVE Sensitive     * >=100,000 COLONIES/mL ESCHERICHIA COLI  Culture, blood (routine x 2)     Status: None (Preliminary result)   Collection Time: 06/05/21 12:43 PM   Specimen: BLOOD LEFT HAND  Result Value Ref Range Status   Specimen Description BLOOD LEFT HAND  Final   Special Requests   Final    BOTTLES DRAWN AEROBIC AND ANAEROBIC Blood Culture adequate volume   Culture   Final    NO GROWTH 4 DAYS Performed at [facility], [address],  [place], [place] [address]    Report Status PENDING  Incomplete  Culture, blood (routine x 2)     Status: None (Preliminary result)   Collection Time: 06/05/21 12:43 PM   Specimen: BLOOD RIGHT HAND  Result Value Ref Range Status   Specimen Description BLOOD RIGHT HAND  Final   Special Requests   Final    BOTTLES DRAWN AEROBIC AND ANAEROBIC Blood Culture results may not be optimal due to an excessive volume of blood received in culture bottles   Culture   Final    NO GROWTH 4 DAYS Performed at [facility], [address], [place], [place] [address]    Report Status PENDING  Incomplete  MRSA Next Gen by PCR, Nasal     Status: None   Collection Time: 06/05/21  4:07 PM   Specimen: Nasal Mucosa; Nasal Swab  Result Value Ref Range Status   MRSA by PCR Next Gen NOT DETECTED NOT DETECTED Final    Comment: (NOTE) The GeneXpert MRSA Assay (FDA approved for NASAL specimens only), is one component of a comprehensive MRSA colonization surveillance program. It is not intended to diagnose MRSA infection nor to guide or monitor treatment for MRSA infections. Test performance is not FDA approved in patients less than 48 years old. Performed at [facility], [address] [place], [place] [address]   Surgical PCR screen     Status: None   Collection Time: 06/09/21  5:03 AM   Specimen: Nasal Mucosa; Nasal Swab  Result Value Ref Range Status   MRSA, PCR NEGATIVE NEGATIVE Final   Staphylococcus aureus NEGATIVE NEGATIVE Final    Comment: (NOTE) The Xpert SA Assay (FDA approved for NASAL specimens in patients 30 years of age and older), is one component of a comprehensive surveillance program. It is not intended to diagnose infection nor to guide or monitor treatment. Performed at [facility], [place] [address] [place], [place] [address]      Radiology Studies: No results found.  Scheduled Meds:  amLODipine  5 mg Oral Daily   aspirin EC  81 mg Oral Daily   atorvastatin  20 mg  Oral Daily   chlorhexidine  15 mL Mouth Rinse BID   Chlorhexidine Gluconate Cloth  6 each Topical Daily   dexamethasone (DECADRON) injection  6 mg Intravenous Q24H   insulin aspart  0-15 Units Subcutaneous TID WC   insulin glargine-yfgn  25 Units Subcutaneous  [id number]   ipratropium  2 spray Each Nare BID   mouth rinse  15 mL Mouth Rinse q12n4p   mometasone-formoterol  2 puff Inhalation BID   mupirocin ointment  1 application Nasal BID   pantoprazole  40 mg Oral Q0600   propranolol ER  80 mg Oral QHS   tamsulosin  0.4 mg Oral QPC supper   Continuous Infusions:  cefTRIAXone (ROCEPHIN)  IV 2 g (06/08/21 1555)   lactated ringers 50 mL/hr at 06/10/21 0605     LOS: 5 days   Time spent: 35 minutes  Little Ishikawa, MD How to contact the [employer] Attending or Consulting provider [place] or covering provider during after hours [place], for this patient?  Check the care team in [facility] and look for a) attending/consulting [facility] provider listed and b) the [facility] team listed Log into [email or web address] and use [place]'s universal password to access. If you do not have the password, please contact the hospital operator. Locate the [facility] provider you are looking for under [facility] and page to a number that you can be directly reached. If you still have difficulty reaching the provider, please page the [facility] (Director on Call) for the Hospitalists listed on amion for assistance.   If 7PM-7AM, please contact night-coverage [email or web address] 06/10/2021, 7:36 AM

## 2021-06-10 NOTE — Progress Notes (Signed)
Urology Inpatient Progress Report  AKI (acute kidney injury) ([place]) [N17.9] Acute kidney injury ([place]) [N17.9] Acute hypoxemic respiratory failure (HCC) [J96.01] COVID-19 [U07.1]  Procedure(s): CYSTOSCOPY REMOVAL OF ARTIFICIAL URINARY SPHINCER; REMOVAL OF INFLATABLE PENILE PROSTHESIS  1 Day Post-Op   Intv/Subj: No acute events overnight. Patient still with some scrotal/penile pain as expected but improved from yesterday.  Afebrile vital signs stable.  JP times tube putting out a small amount.  Principal Problem:   Acute kidney injury ([place]) Active Problems:   Mixed hyperlipidemia   Hypertension   Coronary atherosclerosis of native coronary artery   GERD   Mantle cell lymphoma (HCC)   Prostate cancer (HCC)   Thrombocytopenia, unspecified ([place])   B12 deficiency   Chronic kidney disease, stage 3b (HCC)   Mild cognitive impairment   Type 2 diabetes mellitus ([place])   COVID-19 virus infection  Current Facility-Administered Medications  Medication Dose Route Frequency Provider Last Rate Last Admin   acetaminophen (TYLENOL) tablet 650 mg  650 mg Oral Q6H PRN Donnamae Jude, MD   650 mg at 06/04/21 1733   Or   acetaminophen (TYLENOL) suppository 650 mg  650 mg Rectal Q6H PRN Donnamae Jude, MD       albuterol (VENTOLIN HFA) 108 (90 Base) MCG/ACT inhaler 2 puff  2 puff Inhalation QHS PRN Donnamae Jude, MD   2 puff at 06/08/21 0753   amLODipine (NORVASC) tablet 5 mg  5 mg Oral Daily Johnson, Clanford L, MD   5 mg at 06/10/21 0817   aspirin EC tablet 81 mg  81 mg Oral Daily Johnson, Clanford L, MD   81 mg at 06/10/21 0817   atorvastatin (LIPITOR) tablet 20 mg  20 mg Oral Daily Johnson, Clanford L, MD   20 mg at 06/10/21 0817   cefTRIAXone (ROCEPHIN) 2 g in sodium chloride 0.9 % 100 mL IVPB  2 g Intravenous Q24H Manuella Ghazi, Pratik D, DO 200 mL/hr at 06/08/21 1555 2 g at 06/09/21 1600   chlorhexidine (PERIDEX) 0.12 % solution 15 mL  15 mL Mouth Rinse BID Adefeso, Oladapo, DO   15 mL at 06/10/21  0816   Chlorhexidine Gluconate Cloth 2 % PADS 6 each  6 each Topical Daily Manuella Ghazi, Pratik D, DO   6 each at 06/09/21 1115   dexamethasone (DECADRON) injection 6 mg  6 mg Intravenous Q24H Manuella Ghazi, Pratik D, DO   6 mg at 06/09/21 1131   docusate sodium (COLACE) capsule 200 mg  200 mg Oral Daily PRN Donnamae Jude, MD       fluticasone (FLONASE) 50 MCG/ACT nasal spray 1 spray  1 spray Each Nare PRN Donnamae Jude, MD   1 spray at 06/08/21 0819   HYDROmorphone (DILAUDID) injection 0.5-1 mg  0.5-1 mg Intravenous Q3H PRN Little Ishikawa, MD   1 mg at 06/10/21 0819   insulin aspart (novoLOG) injection 0-15 Units  0-15 Units Subcutaneous TID WC Johnson, Clanford L, MD   2 Units at 06/10/21 0817   insulin glargine-yfgn (SEMGLEE) injection 25 Units  25 Units Subcutaneous Marvia Pickles, Clanford L, MD   25 Units at 06/08/21 0637   ipratropium (ATROVENT) 0.06 % nasal spray 2 spray  2 spray Each Nare BID Donnamae Jude, MD   2 spray at 06/09/21 2141   labetalol (NORMODYNE) injection 10 mg  10 mg Intravenous Q2H PRN Johnson, Clanford L, MD       lactated ringers infusion   Intravenous Continuous Johnson, Clanford L, MD 50  mL/hr at 06/10/21 0605 New Bag at 06/10/21 0605   LORazepam (ATIVAN) injection 1 mg  1 mg Intravenous Q6H PRN Donnamae Jude, MD   1 mg at 06/09/21 2143   MEDLINE mouth rinse  15 mL Mouth Rinse q12n4p Adefeso, Oladapo, DO   15 mL at 06/08/21 1555   mometasone-formoterol (DULERA) 200-5 MCG/ACT inhaler 2 puff  2 puff Inhalation BID Donnamae Jude, MD   2 puff at 06/10/21 0805   mupirocin ointment (BACTROBAN) 2 % 1 application  1 application Nasal BID Irwin Brakeman L, MD   1 application at 50/38/88 0816   ondansetron (ZOFRAN) injection 4 mg  4 mg Intravenous Q6H PRN Little Ishikawa, MD   4 mg at 06/10/21 0819   ondansetron (ZOFRAN) tablet 4 mg  4 mg Oral Q6H PRN Little Ishikawa, MD       oxybutynin (DITROPAN) tablet 5 mg  5 mg Oral Q8H PRN Zierle-Ghosh, Asia B, DO   5 mg at 06/08/21  0625   pantoprazole (PROTONIX) EC tablet 40 mg  40 mg Oral Q0600 Johnson, Clanford L, MD   40 mg at 06/10/21 0602   polyethylene glycol (MIRALAX / GLYCOLAX) packet 17 g  17 g Oral Daily PRN Donnamae Jude, MD       propranolol ER (INDERAL LA) 24 hr capsule 80 mg  80 mg Oral QHS Johnson, Clanford L, MD   80 mg at 06/09/21 2140   tamsulosin (FLOMAX) capsule 0.4 mg  0.4 mg Oral QPC supper Wynetta Emery, Clanford L, MD   0.4 mg at 06/08/21 1715   Facility-Administered Medications Ordered in Other Encounters  Medication Dose Route Frequency Provider Last Rate Last Admin   heparin lock flush 100 unit/mL  500 Units Intravenous Once Kefalas, Thomas S, PA-C       sodium chloride flush (NS) 0.9 % injection 20 mL  20 mL Intravenous PRN Baird Cancer, PA-C         Objective: Vital: Vitals:   06/09/21 1825 06/09/21 2139 06/10/21 0339 06/10/21 0805  BP: (!) 160/90 128/88 131/87   Pulse: 64 66 66   Resp: 16  16   Temp:  97.6 F (36.4 C) 97.6 F (36.4 C)   TempSrc:  Oral Oral   SpO2: 100% 97% 94% 94%  Weight:      Height:       I/Os: I/O last 3 completed shifts: In: 6 [P.O.:100; I.V.:700] Out: 2390 [Urine:2250; Drains:40; Blood:100]  Physical Exam:  General: Patient is in no apparent distress Lungs: Normal respiratory effort, chest expands symmetrically. GI: The abdomen is soft and nontender without mass. JP drain with serosanguinous drainage Foley: Draining clear yellow urine Genitourinary: Uncircumcised phallus.  Penile edema that is stable.  Scrotal edema much improved.  Incisions clean dry and intact. Ext: lower extremities symmetric  Lab Results: Recent Labs    06/08/21 0838 06/09/21 0455 06/10/21 0508  WBC 6.0 4.6 6.8  HGB 15.3 13.6 14.4  HCT 46.3 40.7 43.7   Recent Labs    06/08/21 0838 06/10/21 0508  NA 140 138  K 3.9 4.2  CL 106 100  CO2 24 30  GLUCOSE 191* 176*  BUN 55* 51*  CREATININE 1.72* 1.54*  CALCIUM 8.1* 8.0*   No results for input(s): LABPT, INR in  the last 72 hours. No results for input(s): LABURIN in the last 72 hours. Results for orders placed or performed during the hospital encounter of 06/04/21  Resp Panel by RT-PCR (Flu A&B,  Covid) Nasopharyngeal Swab     Status: Abnormal   Collection Time: 06/04/21 12:23 PM   Specimen: Nasopharyngeal Swab; Nasopharyngeal(NP) swabs in vial transport medium  Result Value Ref Range Status   SARS Coronavirus 2 by RT PCR POSITIVE (A) NEGATIVE Final    Comment: CRITICAL RESULT CALLED TO, READ BACK BY AND VERIFIED WITH: CRAWFORD,H AT 1344 ON 9.12.22 BY RUCINSKI, B (NOTE) SARS-CoV-2 target nucleic acids are DETECTED.  The SARS-CoV-2 RNA is generally detectable in upper respiratory specimens during the acute phase of infection. Positive results are indicative of the presence of the identified virus, but do not rule out bacterial infection or co-infection with other pathogens not detected by the test. Clinical correlation with patient history and other diagnostic information is necessary to determine patient infection status. The expected result is Negative.  Fact Sheet for Patients: EntrepreneurPulse.com.au  Fact Sheet for Healthcare Providers: IncredibleEmployment.be  This test is not yet approved or cleared by the [place] FDA and  has been authorized for detection and/or diagnosis of SARS-CoV-2 by FDA under an Emergency Use Authorization (EUA).  This EUA will remain in effect (mean ing this test can be used) for the duration of  the COVID-19 declaration under Section 564(b)(1) of the Act, 21 U.S.C. section 360bbb-3(b)(1), unless the authorization is terminated or revoked sooner.     Influenza A by PCR NEGATIVE NEGATIVE Final   Influenza B by PCR NEGATIVE NEGATIVE Final    Comment: (NOTE) The Xpert Xpress SARS-CoV-2/FLU/RSV plus assay is intended as an aid in the diagnosis of influenza from Nasopharyngeal swab specimens and should not be used as  a sole basis for treatment. Nasal washings and aspirates are unacceptable for Xpert Xpress SARS-CoV-2/FLU/RSV testing.  Fact Sheet for Patients: EntrepreneurPulse.com.au  Fact Sheet for Healthcare Providers: IncredibleEmployment.be  This test is not yet approved or cleared by the [place] FDA and has been authorized for detection and/or diagnosis of SARS-CoV-2 by FDA under an Emergency Use Authorization (EUA). This EUA will remain in effect (meaning this test can be used) for the duration of the COVID-19 declaration under Section 564(b)(1) of the Act, 21 U.S.C. section 360bbb-3(b)(1), unless the authorization is terminated or revoked.  Performed at [facility], [address] [place], [place] [address]   Blood Culture (routine x 2)     Status: Abnormal   Collection Time: 06/04/21 12:39 PM   Specimen: BLOOD LEFT HAND  Result Value Ref Range Status   Specimen Description   Final    BLOOD LEFT HAND BOTTLES DRAWN AEROBIC AND ANAEROBIC Performed at [facility], [address], [place], [place] [address]    Special Requests   Final    Blood Culture adequate volume Performed at [facility], [address] [place], [place] [address]    Culture  Setup Time   Final    ANAEROBIC BOTTLE ONLY GRAM NEGATIVE RODS Gram Stain Report Called to,Read Back By and Verified With: Belton,k@[email or web address]  by Matthews,b 9.13.22 CRITICAL RESULT CALLED TO, READ BACK BY AND VERIFIED WITH: RN CHRISTY DAWSON 06/05/21@3 :57 BY TW  Performed at [facility], [place] [address] [place], [place] [address]    Culture ESCHERICHIA COLI (A)  Final   Report Status 06/07/2021 FINAL  Final   Organism ID, Bacteria ESCHERICHIA COLI  Final      Susceptibility   Escherichia coli - MIC*    AMPICILLIN >=32 RESISTANT Resistant     CEFAZOLIN <=4 SENSITIVE Sensitive     CEFEPIME <=0.12 SENSITIVE Sensitive  CEFTAZIDIME <=1 SENSITIVE Sensitive     CEFTRIAXONE <=0.25 SENSITIVE Sensitive      CIPROFLOXACIN <=0.25 SENSITIVE Sensitive     GENTAMICIN <=1 SENSITIVE Sensitive     IMIPENEM <=0.25 SENSITIVE Sensitive     TRIMETH/SULFA <=20 SENSITIVE Sensitive     AMPICILLIN/SULBACTAM >=32 RESISTANT Resistant     PIP/TAZO <=4 SENSITIVE Sensitive     * ESCHERICHIA COLI  Blood Culture ID Panel (Reflexed)     Status: Abnormal   Collection Time: 06/04/21 12:39 PM  Result Value Ref Range Status   Enterococcus faecalis NOT DETECTED NOT DETECTED Final   Enterococcus Faecium NOT DETECTED NOT DETECTED Final   Listeria monocytogenes NOT DETECTED NOT DETECTED Final   Staphylococcus species NOT DETECTED NOT DETECTED Final   Staphylococcus aureus (BCID) NOT DETECTED NOT DETECTED Final   Staphylococcus epidermidis NOT DETECTED NOT DETECTED Final   Staphylococcus lugdunensis NOT DETECTED NOT DETECTED Final   Streptococcus species NOT DETECTED NOT DETECTED Final   Streptococcus agalactiae NOT DETECTED NOT DETECTED Final   Streptococcus pneumoniae NOT DETECTED NOT DETECTED Final   Streptococcus pyogenes NOT DETECTED NOT DETECTED Final   A.calcoaceticus-baumannii NOT DETECTED NOT DETECTED Final   Bacteroides fragilis NOT DETECTED NOT DETECTED Final   Enterobacterales DETECTED (A) NOT DETECTED Final    Comment: Enterobacterales represent a large order of gram negative bacteria, not a single organism. CRITICAL RESULT CALLED TO, READ BACK BY AND VERIFIED WITH: RN CHRISTY DAWSON 06/05/21@3 :57 BY TW    Enterobacter cloacae complex NOT DETECTED NOT DETECTED Final   Escherichia coli DETECTED (A) NOT DETECTED Final    Comment: CRITICAL RESULT CALLED TO, READ BACK BY AND VERIFIED WITH: RN CHRISTY DAWSON 06/05/21@3 :57 BY TW    Klebsiella aerogenes NOT DETECTED NOT DETECTED Final   Klebsiella oxytoca NOT DETECTED NOT DETECTED Final   Klebsiella pneumoniae NOT DETECTED NOT DETECTED Final   Proteus species NOT DETECTED NOT DETECTED Final   Salmonella species NOT DETECTED NOT DETECTED Final   Serratia  marcescens NOT DETECTED NOT DETECTED Final   Haemophilus influenzae NOT DETECTED NOT DETECTED Final   Neisseria meningitidis NOT DETECTED NOT DETECTED Final   Pseudomonas aeruginosa NOT DETECTED NOT DETECTED Final   Stenotrophomonas maltophilia NOT DETECTED NOT DETECTED Final   Candida albicans NOT DETECTED NOT DETECTED Final   Candida auris NOT DETECTED NOT DETECTED Final   Candida glabrata NOT DETECTED NOT DETECTED Final   Candida krusei NOT DETECTED NOT DETECTED Final   Candida parapsilosis NOT DETECTED NOT DETECTED Final   Candida tropicalis NOT DETECTED NOT DETECTED Final   Cryptococcus neoformans/gattii NOT DETECTED NOT DETECTED Final   CTX-M ESBL NOT DETECTED NOT DETECTED Final   Carbapenem resistance IMP NOT DETECTED NOT DETECTED Final   Carbapenem resistance KPC NOT DETECTED NOT DETECTED Final   Carbapenem resistance NDM NOT DETECTED NOT DETECTED Final   Carbapenem resist OXA 48 LIKE NOT DETECTED NOT DETECTED Final   Carbapenem resistance VIM NOT DETECTED NOT DETECTED Final    Comment: Performed at [facility], [address], [place], [place] [address]  Blood Culture (routine x 2)     Status: Abnormal   Collection Time: 06/04/21 12:40 PM   Specimen: BLOOD RIGHT FOREARM  Result Value Ref Range Status   Specimen Description   Final    BLOOD RIGHT FOREARM BOTTLES DRAWN AEROBIC AND ANAEROBIC Performed at [facility], [address] [place], [place] [address]    Special Requests   Final    Blood Culture adequate volume Performed  at [facility], [address], [place], [place] [address]    Culture  Setup Time   Final    ANAEROBIC BOTTLE ONLY GRAM NEGATIVE RODS Gram Stain Report Called to,Read Back By and Verified With: SPENCE,[email or web address]  BY MATTHEWS, B 9.13.22 Performed at [facility], [address], [place], [place] [address]    Culture (A)  Final    ESCHERICHIA COLI SUSCEPTIBILITIES PERFORMED ON PREVIOUS CULTURE WITHIN THE LAST 5 DAYS. Performed at [facility], [place] [address], [place], [place] [address]    Report Status 06/07/2021 FINAL  Final  Urine Culture     Status: Abnormal   Collection Time: 06/04/21  2:00 PM   Specimen: In/Out Cath Urine  Result Value Ref Range Status   Specimen Description   Final    IN/OUT CATH URINE Performed at [facility], [address], [place], [place] [address]    Special Requests   Final    NONE Performed at [facility], [address], [place], [place] [address]    Culture >=100,000 COLONIES/mL ESCHERICHIA COLI (A)  Final   Report Status 06/07/2021 FINAL  Final   Organism ID, Bacteria ESCHERICHIA COLI (A)  Final      Susceptibility   Escherichia coli - MIC*    AMPICILLIN >=32 RESISTANT Resistant     CEFAZOLIN <=4 SENSITIVE Sensitive     CEFEPIME <=0.12 SENSITIVE Sensitive     CEFTRIAXONE <=0.25 SENSITIVE Sensitive     CIPROFLOXACIN <=0.25 SENSITIVE Sensitive     GENTAMICIN <=1 SENSITIVE Sensitive     IMIPENEM <=0.25 SENSITIVE Sensitive     NITROFURANTOIN <=16 SENSITIVE Sensitive     TRIMETH/SULFA <=20 SENSITIVE Sensitive     AMPICILLIN/SULBACTAM >=32 RESISTANT Resistant     PIP/TAZO <=4 SENSITIVE Sensitive     * >=100,000 COLONIES/mL ESCHERICHIA COLI  Culture, blood (routine x 2)     Status: None   Collection Time: 06/05/21 12:43 PM   Specimen: BLOOD LEFT HAND  Result Value Ref Range Status   Specimen Description BLOOD LEFT HAND  Final   Special Requests   Final    BOTTLES DRAWN AEROBIC AND ANAEROBIC Blood Culture adequate volume   Culture   Final    NO GROWTH 5 DAYS Performed at [facility], [address] [place], [place] [address]    Report Status 06/10/2021 FINAL  Final  Culture, blood (routine x 2)     Status: None   Collection Time: 06/05/21 12:43 PM   Specimen: BLOOD RIGHT HAND  Result Value Ref Range Status   Specimen Description BLOOD RIGHT HAND  Final   Special Requests   Final    BOTTLES DRAWN AEROBIC AND ANAEROBIC Blood Culture results may not be optimal due to an  excessive volume of blood received in culture bottles   Culture   Final    NO GROWTH 5 DAYS Performed at [facility], [address], [place], [place] [address]    Report Status 06/10/2021 FINAL  Final  MRSA Next Gen by PCR, Nasal     Status: None   Collection Time: 06/05/21  4:07 PM   Specimen: Nasal Mucosa; Nasal Swab  Result Value Ref Range Status   MRSA by PCR Next Gen NOT DETECTED NOT DETECTED Final    Comment: (NOTE) The GeneXpert MRSA Assay (FDA approved for NASAL specimens only), is one component of a comprehensive MRSA colonization surveillance program. It is not intended to diagnose MRSA infection nor to guide or monitor treatment for MRSA infections.  Test performance is not FDA approved in patients less than 24 years old. Performed at [facility], [address], [place], [place] [address]   Surgical PCR screen     Status: None   Collection Time: 06/09/21  5:03 AM   Specimen: Nasal Mucosa; Nasal Swab  Result Value Ref Range Status   MRSA, PCR NEGATIVE NEGATIVE Final   Staphylococcus aureus NEGATIVE NEGATIVE Final    Comment: (NOTE) The Xpert SA Assay (FDA approved for NASAL specimens in patients 82 years of age and older), is one component of a comprehensive surveillance program. It is not intended to diagnose infection nor to guide or monitor treatment. Performed at [facility], [place] [address], [place], [place] [address]     Studies/Results: No results found.  Assessment: Eroded artificial urinary sphincter status post explant of AUS and IPP  Procedure(s): CYSTOSCOPY REMOVAL OF ARTIFICIAL URINARY SPHINCER; REMOVAL OF INFLATABLE PENILE PROSTHESIS, 1 Day Post-Op  doing well.  Plan: Continue JP drains.  Okay from my standpoint to be transitioned over to an oral antibiotic such as Keflex or Bactrim.  Will continue to monitor.  He will need to keep Foley catheter for 3 to 4 weeks with pericatheter retrograde urethrogram prior to consideration  of removal.   Link Snuffer, MD Urology 06/10/2021, 9:15 AM

## 2021-06-10 NOTE — Anesthesia Postprocedure Evaluation (Signed)
Anesthesia Post Note  Patient: ANGUS AMINI  Procedure(s) Performed: CYSTOSCOPY (Urethra) REMOVAL OF ARTIFICIAL URINARY SPHINCER; REMOVAL OF INFLATABLE PENILE PROSTHESIS (Perineum)     Patient location during evaluation: PACU Anesthesia Type: General Level of consciousness: awake Pain management: pain level controlled Vital Signs Assessment: post-procedure vital signs reviewed and stable Respiratory status: spontaneous breathing, nonlabored ventilation, respiratory function stable and patient connected to nasal cannula oxygen Cardiovascular status: blood pressure returned to baseline and stable Postop Assessment: no apparent nausea or vomiting Anesthetic complications: no   No notable events documented.  Last Vitals:  Vitals:   06/09/21 2139 06/10/21 0339  BP: 128/88 131/87  Pulse: 66 66  Resp:  16  Temp: 36.4 C 36.4 C  SpO2: 97% 94%    Last Pain:  Vitals:   06/10/21 0339  TempSrc: Oral  PainSc:                  Karyl Kinnier Tevis Dunavan

## 2021-06-11 DIAGNOSIS — N179 Acute kidney failure, unspecified: Secondary | ICD-10-CM | POA: Diagnosis not present

## 2021-06-11 LAB — BASIC METABOLIC PANEL
Anion gap: 8 (ref 5–15)
BUN: 41 mg/dL — ABNORMAL HIGH (ref 8–23)
CO2: 28 mmol/L (ref 22–32)
Calcium: 7.9 mg/dL — ABNORMAL LOW (ref 8.9–10.3)
Chloride: 101 mmol/L (ref 98–111)
Creatinine, Ser: 1.25 mg/dL — ABNORMAL HIGH (ref 0.61–1.24)
GFR, Estimated: 57 mL/min — ABNORMAL LOW (ref >60)
Glucose, Bld: 169 mg/dL — ABNORMAL HIGH (ref 70–99)
Potassium: 3.7 mmol/L (ref 3.5–5.1)
Sodium: 137 mmol/L (ref 135–145)

## 2021-06-11 LAB — CBC
HCT: 40.1 % (ref 39.0–52.0)
Hemoglobin: 13.1 g/dL (ref 13.0–17.0)
MCH: 29.5 pg (ref 26.0–34.0)
MCHC: 32.7 g/dL (ref 30.0–36.0)
MCV: 90.3 fL (ref 80.0–100.0)
Platelets: 128 10*3/uL — ABNORMAL LOW (ref 150–400)
RBC: 4.44 MIL/uL (ref 4.22–5.81)
RDW: 15.6 % — ABNORMAL HIGH (ref 11.5–15.5)
WBC: 6.5 10*3/uL (ref 4.0–10.5)
nRBC: 0 % (ref 0.0–0.2)

## 2021-06-11 LAB — GLUCOSE, CAPILLARY
Glucose-Capillary: 123 mg/dL — ABNORMAL HIGH (ref 70–99)
Glucose-Capillary: 132 mg/dL — ABNORMAL HIGH (ref 70–99)
Glucose-Capillary: 152 mg/dL — ABNORMAL HIGH (ref 70–99)
Glucose-Capillary: 165 mg/dL — ABNORMAL HIGH (ref 70–99)
Glucose-Capillary: 210 mg/dL — ABNORMAL HIGH (ref 70–99)
Glucose-Capillary: 216 mg/dL — ABNORMAL HIGH (ref 70–99)
Glucose-Capillary: 220 mg/dL — ABNORMAL HIGH (ref 70–99)

## 2021-06-11 MED ORDER — INSULIN GLARGINE-YFGN 100 UNIT/ML ~~LOC~~ SOLN
25.0000 [IU] | Freq: Every day | SUBCUTANEOUS | Status: DC
  Administered 2021-06-11 – 2021-06-12 (×2): 25 [IU] via SUBCUTANEOUS
  Filled 2021-06-11 (×2): qty 0.25

## 2021-06-11 MED ORDER — HYDROCODONE-ACETAMINOPHEN 5-325 MG PO TABS: 1.0000 | ORAL_TABLET | ORAL | 0 refills | Status: DC | PRN

## 2021-06-11 MED ORDER — HYDROCODONE-ACETAMINOPHEN 5-325 MG PO TABS
1.0000 | ORAL_TABLET | ORAL | Status: DC | PRN
  Administered 2021-06-11 – 2021-06-12 (×2): 1 via ORAL
  Filled 2021-06-11 (×2): qty 1

## 2021-06-11 NOTE — Care Management Important Message (Signed)
Important Message  Patient Details IM Letter given to the Patient. Name: [name] MRN: [id number] Date of Birth: Aug 18, 1935   Medicare Important Message Given:  Yes     [name] 06/11/2021, 2:12 PM

## 2021-06-11 NOTE — Progress Notes (Signed)
Patient states desire to discharge to rehabilitation facility.  Spoke with daughter and wife on the phone who agree with discharging patient to rehab. Angie Fava

## 2021-06-11 NOTE — Evaluation (Signed)
Physical Therapy Evaluation Patient Details Name: [name] MRN: [id number] DOB: [birth year] Today's Date: 06/11/2021  History of Present Illness  [name] is a 86 y.o. adm to [facility] with c/o weakness, dx with sepsis 2* UTI, resultant AKI.  transferred to [facility] and is now s/p  CYSTOSCOPY  REMOVAL OF ARTIFICIAL URINARY SPHINCER; REMOVAL OF INFLATABLE PENILE PROSTHESIS (2 Days Post-Op) PMH: CAD, HLD, DM, HTN, CKD, B12 deficiency, mantle cell lymphoma of the colon, GERD, PVD, prostate CA, OSA not on CPAP who was seen in the ED on 8/27 with a positive COVID test.  Clinical Impression  Pt seen for post op--re-eval today. Pt able to sit EOB however with c/o incr nausea despite meds, incr dizziness sitting and standing, unabel to get standing BP. Sitting 136/92.   Marked change from mobility prior to surgery. Will continue to follow in acute setting. Do not feel pt is ready to d/c today, RN at bedside and in agreement. Pt will likely be ready once medical issues resolve and cognition improves.     Recommendations for follow up therapy are one component of a multi-disciplinary discharge planning process, led by the attending physician.  Recommendations may be updated based on patient status, additional functional criteria and insurance authorization.  Follow Up Recommendations No PT follow up;Supervision - Intermittent    Equipment Recommendations  None recommended by PT    Recommendations for Other Services       Precautions / Restrictions Precautions Precautions: Fall Restrictions Weight Bearing Restrictions: No      Mobility  Bed Mobility Overal bed mobility: Needs Assistance Bed Mobility: Supine to Sit;Sit to Supine     Supine to sit: Supervision Sit to supine: Min assist   General bed mobility comments: incr time to come to sit, supervision for safety; assist to bring LEs on to    Transfers Overall transfer level: Needs assistance Equipment used: Rolling walker (2  wheeled) Transfers: Sit to/from Stand Sit to Stand: Min guard         General transfer comment: for safety. c/o dizziness EOB. BP 136/92, incr dizziness reported in standing and unable to get standing BP  Ambulation/Gait             General Gait Details: unable d/t nausea and dizziness  Stairs            Wheelchair Mobility    Modified Rankin (Stroke Patients Only)       Balance Overall balance assessment: Needs assistance           Standing balance-Leahy Scale: Poor Standing balance comment: reliant on UEs for safe standing d/t dizziness                             Pertinent Vitals/Pain Pain Assessment: No/denies pain    Home Living Family/patient expects to be discharged to:: Private residence Living Arrangements: Spouse/significant other Available Help at Discharge: Family;Available 24 hours/day Type of Home: House Home Access: Level entry     Home Layout: One level Home Equipment: Walker - 2 wheels;Cane - single point Additional Comments: local dtr--Linda    Prior Function Level of Independence: Independent         Comments: Hydrographic surveyor, drives     Journalist, newspaper        Extremity/Trunk Assessment   Upper Extremity Assessment Upper Extremity Assessment: Overall WFL for tasks assessed    Lower Extremity Assessment Lower Extremity Assessment: Overall WFL for tasks  assessed       Communication   Communication: No difficulties  Cognition Arousal/Alertness: Awake/alert Behavior During Therapy: WFL for tasks assessed/performed Overall Cognitive Status: Impaired/Different from baseline Area of Impairment: Problem solving                 Orientation Level: Disoriented to;Situation           Problem Solving: Slow processing;Requires verbal cues General Comments: pt stating that PT's "husband" gave him nausea meds; intermittently confused during PT eval, oriented to self and place      General  Comments      Exercises     Assessment/Plan    PT Assessment Patient needs continued PT services  PT Problem List Decreased activity tolerance;Decreased balance;Decreased knowledge of use of DME;Decreased cognition       PT Treatment Interventions DME instruction;Therapeutic activities;Gait training;Functional mobility training;Therapeutic exercise;Patient/family education    PT Goals (Current goals can be found in the Care Plan section)  Acute Rehab PT Goals Patient Stated Goal: return home with family to assist PT Goal Formulation: With patient Time For Goal Achievement: 06/08/21 Potential to Achieve Goals: Good    Frequency Min 3X/week   Barriers to discharge        Co-evaluation               AM-PAC PT "6 Clicks" Mobility  Outcome Measure Help needed turning from your back to your side while in a flat bed without using bedrails?: A Little Help needed moving from lying on your back to sitting on the side of a flat bed without using bedrails?: A Little Help needed moving to and from a bed to a chair (including a wheelchair)?: A Little Help needed standing up from a chair using your arms (e.g., wheelchair or bedside chair)?: A Little Help needed to walk in hospital room?: A Little Help needed climbing 3-5 steps with a railing? : A Little 6 Click Score: 18    End of Session   Activity Tolerance: Treatment limited secondary to medical complications (Comment) Patient left: with call bell/phone within reach;in bed;with bed alarm set Nurse Communication: Mobility status PT Visit Diagnosis: Unsteadiness on feet (R26.81);Other abnormalities of gait and mobility (R26.89);Muscle weakness (generalized) (M62.81)    Time: 3235-5732 PT Time Calculation (min) (ACUTE ONLY): 20 min   Charges:   PT Evaluation $PT Re-evaluation: 1 Re-eval          Monice Lundy, PT  Acute Rehab Dept [facility]) [phone] Pager [phone]  06/11/2021   [facility] 06/11/2021, 3:49  PM

## 2021-06-11 NOTE — Progress Notes (Signed)
Patient repeatedly trying to get OOB to use the bathroom. RN informed patient that he has a foley, and he does not have to go to the bathroom to urinate. Patient became verbally aggressive with staff, threatening to harm staff if we did not get out of his way.

## 2021-06-11 NOTE — Progress Notes (Signed)
    OVERNIGHT PROGRESS REPORT  Notified by RN for continued interference with devices and care unresolved by: re-orientation, diversion, or medication,  With medications, patient became verbally aggressive, threatened staff, extra staff had to be called into the room to help with this patient.  Soft waist belt incorporated and will be removed as soon as possible    Gershon Cull MSNA MSN [place]

## 2021-06-11 NOTE — Discharge Summary (Addendum)
Physician Discharge Summary  JOSEPH BIAS HYQ:[id number] DOB: 1935-04-19 DOA: 06/04/2021  PCP: Celene Squibb, MD  Admit date: 06/04/2021 Discharge date: 06/11/2021  Admitted From: Home Disposition: Home  Recommendations for Outpatient Follow-up:  Follow up with PCP in 1-2 weeks Please obtain BMP/CBC in one week Please follow up with urology in 1 to 2 weeks as scheduled  Home Health: Nursing Equipment/Devices: None  Discharge Condition: Stable CODE STATUS: Full Diet recommendation: As tolerated low-salt low-fat low-carb diet  Brief/Interim Summary: KELLY EISLER is a 86 y.o. male with medical history significant of CAD, HLD, DM, HTN, CKD, B12 deficiency, mantle cell lymphoma of the colon, GERD, PVD, prostate CA, OSA not on CPAP who notably positive for covid on 8/27 and completed treatment previously. Admitted for Sepsis 2/2 UTI with concurrent bacteremia.  Patient has worsening pain late afternoon - further discussion with family and they are essentially refusing to care for or help patient manage his bandages/foley catheter and is thus a safe discharge home. Will transition to SNF for ongoing management.   Assessment & Plan:   Severe Sepsis (POA) secondary to E. coli bacteremia related to UTI with noted AKI -Continue keflex per Urology -Foley continued for 2-3 weeks per urology -Urine culture with findings of E coli infection  -Repeat blood cultures no growth to date   Acute metabolic encephalopathy secondary to above in the setting of mild cognitive impairment, resolved - Discontinue sedating agents such as gabapentin and temazepam - CT head with no acute findings - Appears to be back to baseline post operatively   Acute urinary retention Penile edema - Foley placed - Urology to follow outpatient - Status post cystoscopy with removal of artificial urinary sphincter and inflatable penile prosthesis 06/09/21 - flomax ongoing   AKI on CKD stage IIIb -  improving -Likely prerenal in the setting of sepsis -resolving   Acute hypoxemic respiratory failure, resolved -Likely secondary to mental status and severe sepsis, resolved   COVID-19 positive without acute infection -Patient is well outside the window for acute COVID infection given positive on 05/19/2021 status posttreatment with paxlovid -no longer on steroids   Thrombocytopenia-acute on chronic -Likely secondary to sepsis -Continue to monitor in the outpatient setting   Type 2 diabetes with hyperglycemia -A1c 9.2% -Resume home medications, lengthy discussion about need for dietary compliance in the setting of uncontrolled diabetes with hyperglycemia   Hypertension - better controlled -Continue home BP medications   History of CAD -Continue home meds   History of mantle cell lymphoma -Has not seen hematology since 2019  Discharge Instructions  Discharge Instructions     Diet - low sodium heart healthy   Complete by: As directed    Increase activity slowly   Complete by: As directed       Allergies as of 06/11/2021       Reactions   Bee Venom Anaphylaxis   Adhesive [tape] Hives   Ciprofloxacin    Unknown    Codeine    Unknown    Iodine    Unknown    Latex    Unknown    Povidone-iodine    Unknown    Simvastatin    Unknown         Medication List     STOP taking these medications    aspirin EC 81 MG tablet   atorvastatin 20 MG tablet Commonly known as: LIPITOR       TAKE these medications    Accu-Chek FastClix Lancets Misc Apply  topically.   Accu-Chek Guide test strip Generic drug: glucose blood   acetaminophen 325 MG tablet Commonly known as: TYLENOL Take 650 mg by mouth every 4 (four) hours as needed for mild pain or moderate pain. Take 2 tablets 1 hour prior to Rituxan treatment.   albuterol 108 (90 Base) MCG/ACT inhaler Commonly known as: ProAir HFA Inhale 2 puffs into the lungs at bedtime as needed for wheezing or shortness of  breath.   amLODipine 5 MG tablet Commonly known as: NORVASC Take 1 tablet (5 mg total) by mouth daily.   budesonide-formoterol 160-4.5 MCG/ACT inhaler Commonly known as: SYMBICORT Inhale 2 puffs into the lungs 2 (two) times daily.   cephALEXin 500 MG capsule Commonly known as: KEFLEX Take 1 capsule (500 mg total) by mouth 3 (three) times daily for 6 days.   docusate sodium 100 MG capsule Commonly known as: COLACE Take 200 mg by mouth daily as needed for mild constipation.   fluticasone 50 MCG/ACT nasal spray Commonly known as: FLONASE Place 1 spray into both nostrils as needed for allergies or rhinitis.   gabapentin 300 MG capsule Commonly known as: NEURONTIN Take 1 cap in AM and 2 caps at night (300AM, 600PM)   glipiZIDE 10 MG tablet Commonly known as: GLUCOTROL Take 10 mg by mouth 2 (two) times daily before a meal.   HYDROcodone-acetaminophen 5-325 MG tablet Commonly known as: NORCO/VICODIN Take 1 tablet by mouth every 4 (four) hours as needed for moderate pain.   hydrocortisone 2.5 % cream Apply 1 application topically as needed (itching).   insulin lispro 100 UNIT/ML cartridge Commonly known as: HUMALOG Inject 1-10 Units into the skin as directed. On sliding scale when eating a big meal   ipratropium 0.06 % nasal spray Commonly known as: ATROVENT Place 2 sprays into both nostrils 2 (two) times daily.   meclizine 25 MG tablet Commonly known as: ANTIVERT Take 25 mg by mouth 2 (two) times daily as needed for dizziness.   pantoprazole 40 MG tablet Commonly known as: PROTONIX TAKE 1 TABLET BY MOUTH 2 TIMES DAILY BEFORE A MEAL. What changed: See the new instructions.   propranolol ER 60 MG 24 hr capsule Commonly known as: Inderal LA Take 1 capsule (60 mg total) by mouth at bedtime. For tremor. What changed:  medication strength how much to take   tamsulosin 0.4 MG Caps capsule Commonly known as: FLOMAX Take 1 capsule (0.4 mg total) by mouth daily after  supper.   temazepam 15 MG capsule Commonly known as: RESTORIL Take 15 mg by mouth at bedtime as needed for sleep.   testosterone cypionate 200 MG/ML injection Commonly known as: DEPOTESTOSTERONE CYPIONATE Inject 200 mg into the muscle every 14 (fourteen) days.   Toujeo SoloStar 300 UNIT/ML Solostar Pen Generic drug: insulin glargine (1 Unit Dial) Inject 25 Units into the skin every morning. What changed: how much to take        Allergies  Allergen Reactions   Bee Venom Anaphylaxis   Adhesive [Tape] Hives   Ciprofloxacin     Unknown    Codeine     Unknown     Iodine     Unknown     Latex     Unknown     Povidone-Iodine     Unknown     Simvastatin     Unknown      Consultations: Urology   Procedures/Studies: CT HEAD WO CONTRAST (5MM)  Result Date: 06/04/2021 CLINICAL DATA:  Dizziness and weakness today. Recent  COVID-19 infection. Cerebral hemorrhage suspected. EXAM: CT HEAD WITHOUT CONTRAST TECHNIQUE: Contiguous axial images were obtained from the base of the skull through the vertex without intravenous contrast. COMPARISON:  CT head 05/05/2021 and 02/17/2018. FINDINGS: Brain: There is no evidence of acute intracranial hemorrhage, mass lesion, brain edema or extra-axial fluid collection. Atrophy with prominence of the ventricles and subarachnoid spaces. There is stable mild low-density in the periventricular white matter, most consistent with chronic small vessel ischemic change. There is no CT evidence of acute cortical infarction. Vascular: Mild intracranial atherosclerosis. No hyperdense vessel identified. Skull: Negative for fracture or focal lesion. Sinuses/Orbits: New mucosal thickening and partial opacification of the bilateral ethmoid, right frontal and right maxillary sinuses without air-fluid levels. The mastoid air cells and middle ears are clear. Stable postsurgical changes in both orbits. Other: None. IMPRESSION: 1. Stable appearance of the brain without  acute intracranial findings. 2. New paranasal sinus mucosal thickening and partial opacification consistent with sinusitis. Electronically Signed   By: Richardean Sale M.D.   On: 06/04/2021 14:40   CT CHEST ABDOMEN PELVIS W CONTRAST  Result Date: 05/19/2021 CLINICAL DATA:  Acute abdominal pain, low back pain, shortness of breath, dizziness. History of colon and prostate cancer, status post prostatectomy. History of diabetes. EXAM: CT CHEST, ABDOMEN, AND PELVIS WITH CONTRAST TECHNIQUE: Multidetector CT imaging of the chest, abdomen and pelvis was performed following the standard protocol during bolus administration of intravenous contrast. CONTRAST:  67m OMNIPAQUE IOHEXOL 300 MG/ML  SOLN COMPARISON:  Chest CT angiogram dated 05/05/2021. CT abdomen and pelvis dated 05/05/2021. FINDINGS: CT CHEST FINDINGS Cardiovascular: No thoracic aortic aneurysm or evidence of aortic dissection. Aortic atherosclerosis noted. No pericardial effusion. Mediastinum/Nodes: No mass or enlarged lymph nodes are seen within the mediastinum or perihilar regions. Esophagus is unremarkable. Trachea is unremarkable. Lungs/Pleura: Mild scarring/atelectasis within the lower lobes bilaterally. No consolidation, pleural effusion or pneumothorax. Chronic bronchitic changes noted within the bilateral perihilar regions. Musculoskeletal: No acute findings. Mild degenerative spondylosis within the mid/lower thoracic spine. CT ABDOMEN PELVIS FINDINGS Hepatobiliary: Multiple layering stones within the otherwise normal-appearing gallbladder. No pericholecystic inflammation. No focal liver abnormality is seen. No bile duct dilatation. Pancreas: Unremarkable. No pancreatic ductal dilatation or surrounding inflammatory changes. Spleen: Normal in size without focal abnormality. Adrenals/Urinary Tract: Adrenal glands appear normal. Multiple bilateral renal cysts. Mild bilateral hydronephrosis, but this appears improved compared to the previous study of  05/05/2021. No ureteral or bladder calculi. Mild bladder wall thickening, circumferential, stable to slightly improved compared to the earlier CT of 05/05/2021. Stomach/Bowel: No dilated large or small bowel loops. Fairly extensive diverticulosis of the descending and upper sigmoid colon but no focal inflammatory change to suggest acute diverticulitis. No evidence of bowel wall inflammation. Stomach is unremarkable, partially decompressed. Appendix appears normal. Vascular/Lymphatic: Aortic atherosclerosis. No abdominal aortic aneurysm or evidence of dissection. No acute-appearing vascular abnormality. No enlarged lymph nodes are seen. Reproductive: Penile prosthesis in place, without obvious complicating feature. Associated reservoir within the LEFT lower pelvis. Presumed prostatectomy. Other: No free fluid or abscess collection. No free intraperitoneal air. Musculoskeletal: Degenerative spondylosis of the lumbar spine, at least moderate in degree. No acute or suspicious osseous abnormality. IMPRESSION: 1. No acute findings within the chest, abdomen or pelvis. 2. Chronic bronchitic changes. No evidence of pneumonia. No pleural effusion. 3. Mild bilateral hydronephrosis, but this appears improved compared to the previous study of 05/05/2021. No ureteral or bladder calculi. Mild bladder wall thickening, stable to slightly improved compared to the earlier CT of 05/05/2021. This  could represent chronic cystitis or chronic bladder outlet obstruction. No evidence of pyelonephritis. 4. Colonic diverticulosis without evidence of acute diverticulitis. No bowel obstruction or evidence of bowel wall inflammation. 5. Cholelithiasis without evidence of acute cholecystitis. 6. Degenerative spondylosis of the thoracolumbar spine, at least moderate in degree within the lumbar spine. No acute or suspicious osseous abnormality. 7. No thoracic or abdominal aortic aneurysm. Aortic Atherosclerosis (ICD10-I70.0). Electronically Signed    By: Franki Cabot M.D.   On: 05/19/2021 11:43   DG CHEST PORT 1 VIEW  Result Date: 06/04/2021 CLINICAL DATA:  Acute hypoxic respiratory failure. COVID-19 infection. History of diabetes. EXAM: PORTABLE CHEST 1 VIEW COMPARISON:  Radiographs 06/04/2021 and 05/19/2021.  CT 05/19/2021. FINDINGS: 2007 hours. Lower lung volumes with mild patient rotation to the left. The heart size and mediastinal contours are stable with aortic atherosclerosis. There are lower lung volumes with mildly increased patchy opacities at both lung bases, probably atelectasis. No confluent airspace opacity or typical ground-glass opacities of viral infection. No pleural effusion or pneumothorax. The bones appear unchanged status post lower cervical fusion. IMPRESSION: Lower lung volumes with probable mildly increased bibasilar atelectasis. No consolidation or typical findings of viral pneumonia. Electronically Signed   By: Richardean Sale M.D.   On: 06/04/2021 20:27   DG Chest Port 1 View  Result Date: 06/04/2021 CLINICAL DATA:  Questionable sepsis EXAM: PORTABLE CHEST 1 VIEW COMPARISON:  05/19/2021 FINDINGS: The heart size and mediastinal contours are within normal limits. Mild bibasilar atelectasis is again noted. No pleural effusion or pneumothorax. The visualized skeletal structures are unremarkable. IMPRESSION: Mild bibasilar atelectasis. Electronically Signed   By: Merilyn Baba M.D.   On: 06/04/2021 13:17   DG Chest Portable 1 View  Result Date: 05/19/2021 CLINICAL DATA:  Shortness of breath. EXAM: PORTABLE CHEST 1 VIEW COMPARISON:  May 05, 2021. FINDINGS: The heart size and mediastinal contours are within normal limits. Minimal bibasilar subsegmental atelectasis is noted. The visualized skeletal structures are unremarkable. IMPRESSION: Minimal bibasilar subsegmental atelectasis. Electronically Signed   By: Marijo Conception M.D.   On: 05/19/2021 09:34     Subjective: No acute issues or events overnight denies nausea  vomiting diarrhea constipation any fevers chills or chest pain   Discharge Exam: Vitals:   06/11/21 0731 06/11/21 1135  BP:  130/79  Pulse:  65  Resp:  20  Temp:  98.2 F (36.8 C)  SpO2: 93% 93%   Vitals:   06/11/21 0014 06/11/21 0440 06/11/21 0731 06/11/21 1135  BP: 129/75 135/84  130/79  Pulse: 62 (!) 58  65  Resp:  18  20  Temp:  97.8 F (36.6 C)  98.2 F (36.8 C)  TempSrc:  Oral    SpO2:  90% 93% 93%  Weight:      Height:       General:  Pleasantly resting in bed, No acute distress. HEENT:  Normocephalic atraumatic.  Sclerae nonicteric, noninjected. Extraocular movements intact bilaterally. Neck: Without mass or deformity. Trachea is midline. Lungs: Clear to auscultate bilaterally without rhonchi, wheeze, or rales. Heart: Regular rate and rhythm. Without murmurs, rubs, or gallops. Abdomen:  Soft, nontender, nondistended. Without guarding or rebound. Foley catheter draining clear yellow urine Extremities: Without cyanosis, clubbing, edema, or obvious deformity. Vascular:  Dorsalis pedis and posterior tibial pulses palpable bilaterally. Skin:  Warm and dry, no erythema, no ulcerations.  The results of significant diagnostics from this hospitalization (including imaging, microbiology, ancillary and laboratory) are listed below for reference.  Microbiology: Recent Results (from the past 240 hour(s))  Resp Panel by RT-PCR (Flu A&B, Covid) Nasopharyngeal Swab     Status: Abnormal   Collection Time: 06/04/21 12:23 PM   Specimen: Nasopharyngeal Swab; Nasopharyngeal(NP) swabs in vial transport medium  Result Value Ref Range Status   SARS Coronavirus 2 by RT PCR POSITIVE (A) NEGATIVE Final    Comment: CRITICAL RESULT CALLED TO, READ BACK BY AND VERIFIED WITH: CRAWFORD,H AT 1344 ON 9.12.22 BY RUCINSKI, B (NOTE) SARS-CoV-2 target nucleic acids are DETECTED.  The SARS-CoV-2 RNA is generally detectable in upper respiratory specimens during the acute phase of infection.  Positive results are indicative of the presence of the identified virus, but do not rule out bacterial infection or co-infection with other pathogens not detected by the test. Clinical correlation with patient history and other diagnostic information is necessary to determine patient infection status. The expected result is Negative.  Fact Sheet for Patients: EntrepreneurPulse.com.au  Fact Sheet for Healthcare Providers: IncredibleEmployment.be  This test is not yet approved or cleared by the [place] FDA and  has been authorized for detection and/or diagnosis of SARS-CoV-2 by FDA under an Emergency Use Authorization (EUA).  This EUA will remain in effect (mean ing this test can be used) for the duration of  the COVID-19 declaration under Section 564(b)(1) of the Act, 21 U.S.C. section 360bbb-3(b)(1), unless the authorization is terminated or revoked sooner.     Influenza A by PCR NEGATIVE NEGATIVE Final   Influenza B by PCR NEGATIVE NEGATIVE Final    Comment: (NOTE) The Xpert Xpress SARS-CoV-2/FLU/RSV plus assay is intended as an aid in the diagnosis of influenza from Nasopharyngeal swab specimens and should not be used as a sole basis for treatment. Nasal washings and aspirates are unacceptable for Xpert Xpress SARS-CoV-2/FLU/RSV testing.  Fact Sheet for Patients: EntrepreneurPulse.com.au  Fact Sheet for Healthcare Providers: IncredibleEmployment.be  This test is not yet approved or cleared by the [place] FDA and has been authorized for detection and/or diagnosis of SARS-CoV-2 by FDA under an Emergency Use Authorization (EUA). This EUA will remain in effect (meaning this test can be used) for the duration of the COVID-19 declaration under Section 564(b)(1) of the Act, 21 U.S.C. section 360bbb-3(b)(1), unless the authorization is terminated or revoked.  Performed at [facility], [address] [place], [place] [address]   Blood Culture (routine x 2)     Status: Abnormal   Collection Time: 06/04/21 12:39 PM   Specimen: BLOOD LEFT HAND  Result Value Ref Range Status   Specimen Description   Final    BLOOD LEFT HAND BOTTLES DRAWN AEROBIC AND ANAEROBIC Performed at [facility], [address], [place], [place] [address]    Special Requests   Final    Blood Culture adequate volume Performed at [facility], [address] [place], [place] [address]    Culture  Setup Time   Final    ANAEROBIC BOTTLE ONLY GRAM NEGATIVE RODS Gram Stain Report Called to,Read Back By and Verified With: Belton,k@[email or web address]  by Matthews,b 9.13.22 CRITICAL RESULT CALLED TO, READ BACK BY AND VERIFIED WITH: RN CHRISTY DAWSON 06/05/21@3 :57 BY TW  Performed at [facility], [place] [address], [place], [place] [address]    Culture ESCHERICHIA COLI (A)  Final   Report Status 06/07/2021 FINAL  Final   Organism ID, Bacteria ESCHERICHIA COLI  Final      Susceptibility   Escherichia coli - MIC*    AMPICILLIN >=32 RESISTANT Resistant  CEFAZOLIN <=4 SENSITIVE Sensitive     CEFEPIME <=0.12 SENSITIVE Sensitive     CEFTAZIDIME <=1 SENSITIVE Sensitive     CEFTRIAXONE <=0.25 SENSITIVE Sensitive     CIPROFLOXACIN <=0.25 SENSITIVE Sensitive     GENTAMICIN <=1 SENSITIVE Sensitive     IMIPENEM <=0.25 SENSITIVE Sensitive     TRIMETH/SULFA <=20 SENSITIVE Sensitive     AMPICILLIN/SULBACTAM >=32 RESISTANT Resistant     PIP/TAZO <=4 SENSITIVE Sensitive     * ESCHERICHIA COLI  Blood Culture ID Panel (Reflexed)     Status: Abnormal   Collection Time: 06/04/21 12:39 PM  Result Value Ref Range Status   Enterococcus faecalis NOT DETECTED NOT DETECTED Final   Enterococcus Faecium NOT DETECTED NOT DETECTED Final   Listeria monocytogenes NOT DETECTED NOT DETECTED Final   Staphylococcus species NOT DETECTED NOT DETECTED Final   Staphylococcus aureus (BCID) NOT DETECTED NOT DETECTED Final   Staphylococcus epidermidis NOT  DETECTED NOT DETECTED Final   Staphylococcus lugdunensis NOT DETECTED NOT DETECTED Final   Streptococcus species NOT DETECTED NOT DETECTED Final   Streptococcus agalactiae NOT DETECTED NOT DETECTED Final   Streptococcus pneumoniae NOT DETECTED NOT DETECTED Final   Streptococcus pyogenes NOT DETECTED NOT DETECTED Final   A.calcoaceticus-baumannii NOT DETECTED NOT DETECTED Final   Bacteroides fragilis NOT DETECTED NOT DETECTED Final   Enterobacterales DETECTED (A) NOT DETECTED Final    Comment: Enterobacterales represent a large order of gram negative bacteria, not a single organism. CRITICAL RESULT CALLED TO, READ BACK BY AND VERIFIED WITH: RN CHRISTY DAWSON 06/05/21@3 :57 BY TW    Enterobacter cloacae complex NOT DETECTED NOT DETECTED Final   Escherichia coli DETECTED (A) NOT DETECTED Final    Comment: CRITICAL RESULT CALLED TO, READ BACK BY AND VERIFIED WITH: RN CHRISTY DAWSON 06/05/21@3 :57 BY TW    Klebsiella aerogenes NOT DETECTED NOT DETECTED Final   Klebsiella oxytoca NOT DETECTED NOT DETECTED Final   Klebsiella pneumoniae NOT DETECTED NOT DETECTED Final   Proteus species NOT DETECTED NOT DETECTED Final   Salmonella species NOT DETECTED NOT DETECTED Final   Serratia marcescens NOT DETECTED NOT DETECTED Final   Haemophilus influenzae NOT DETECTED NOT DETECTED Final   Neisseria meningitidis NOT DETECTED NOT DETECTED Final   Pseudomonas aeruginosa NOT DETECTED NOT DETECTED Final   Stenotrophomonas maltophilia NOT DETECTED NOT DETECTED Final   Candida albicans NOT DETECTED NOT DETECTED Final   Candida auris NOT DETECTED NOT DETECTED Final   Candida glabrata NOT DETECTED NOT DETECTED Final   Candida krusei NOT DETECTED NOT DETECTED Final   Candida parapsilosis NOT DETECTED NOT DETECTED Final   Candida tropicalis NOT DETECTED NOT DETECTED Final   Cryptococcus neoformans/gattii NOT DETECTED NOT DETECTED Final   CTX-M ESBL NOT DETECTED NOT DETECTED Final   Carbapenem resistance IMP NOT  DETECTED NOT DETECTED Final   Carbapenem resistance KPC NOT DETECTED NOT DETECTED Final   Carbapenem resistance NDM NOT DETECTED NOT DETECTED Final   Carbapenem resist OXA 48 LIKE NOT DETECTED NOT DETECTED Final   Carbapenem resistance VIM NOT DETECTED NOT DETECTED Final    Comment: Performed at [facility], [address] [place], [place] [address]  Blood Culture (routine x 2)     Status: Abnormal   Collection Time: 06/04/21 12:40 PM   Specimen: BLOOD RIGHT FOREARM  Result Value Ref Range Status   Specimen Description   Final    BLOOD RIGHT FOREARM BOTTLES DRAWN AEROBIC AND ANAEROBIC Performed at [facility], [address], [place], [place] [address]  Special Requests   Final    Blood Culture adequate volume Performed at [facility], [address] [place], [place] [address]    Culture  Setup Time   Final    ANAEROBIC BOTTLE ONLY GRAM NEGATIVE RODS Gram Stain Report Called to,Read Back By and Verified With: SPENCE,[id number]  BY MATTHEWS, B 9.13.22 Performed at [facility], [address] [place], [place] [address]    Culture (A)  Final    ESCHERICHIA COLI SUSCEPTIBILITIES PERFORMED ON PREVIOUS CULTURE WITHIN THE LAST 5 DAYS. Performed at [facility], [place] [address], [place], [place] [address]    Report Status 06/07/2021 FINAL  Final  Urine Culture     Status: Abnormal   Collection Time: 06/04/21  2:00 PM   Specimen: In/Out Cath Urine  Result Value Ref Range Status   Specimen Description   Final    IN/OUT CATH URINE Performed at [facility], [address], [place], [place] [address]    Special Requests   Final    NONE Performed at [facility], [address] [place], [place] [address]    Culture >=100,000 COLONIES/mL ESCHERICHIA COLI (A)  Final   Report Status 06/07/2021 FINAL  Final   Organism ID, Bacteria ESCHERICHIA COLI (A)  Final      Susceptibility   Escherichia coli - MIC*    AMPICILLIN >=32 RESISTANT Resistant     CEFAZOLIN <=4 SENSITIVE  Sensitive     CEFEPIME <=0.12 SENSITIVE Sensitive     CEFTRIAXONE <=0.25 SENSITIVE Sensitive     CIPROFLOXACIN <=0.25 SENSITIVE Sensitive     GENTAMICIN <=1 SENSITIVE Sensitive     IMIPENEM <=0.25 SENSITIVE Sensitive     NITROFURANTOIN <=16 SENSITIVE Sensitive     TRIMETH/SULFA <=20 SENSITIVE Sensitive     AMPICILLIN/SULBACTAM >=32 RESISTANT Resistant     PIP/TAZO <=4 SENSITIVE Sensitive     * >=100,000 COLONIES/mL ESCHERICHIA COLI  Culture, blood (routine x 2)     Status: None   Collection Time: 06/05/21 12:43 PM   Specimen: BLOOD LEFT HAND  Result Value Ref Range Status   Specimen Description BLOOD LEFT HAND  Final   Special Requests   Final    BOTTLES DRAWN AEROBIC AND ANAEROBIC Blood Culture adequate volume   Culture   Final    NO GROWTH 5 DAYS Performed at [facility], [address], [place], [place] [address]    Report Status 06/10/2021 FINAL  Final  Culture, blood (routine x 2)     Status: None   Collection Time: 06/05/21 12:43 PM   Specimen: BLOOD RIGHT HAND  Result Value Ref Range Status   Specimen Description BLOOD RIGHT HAND  Final   Special Requests   Final    BOTTLES DRAWN AEROBIC AND ANAEROBIC Blood Culture results may not be optimal due to an excessive volume of blood received in culture bottles   Culture   Final    NO GROWTH 5 DAYS Performed at [facility], [address], [place], [place] [address]    Report Status 06/10/2021 FINAL  Final  MRSA Next Gen by PCR, Nasal     Status: None   Collection Time: 06/05/21  4:07 PM   Specimen: Nasal Mucosa; Nasal Swab  Result Value Ref Range Status   MRSA by PCR Next Gen NOT DETECTED NOT DETECTED Final    Comment: (NOTE) The GeneXpert MRSA Assay (FDA approved for NASAL specimens only), is one component of a comprehensive MRSA colonization surveillance program. It is not intended to  diagnose MRSA infection nor to guide or monitor treatment for MRSA infections. Test performance is not FDA approved in patients less  than 30 years old. Performed at [facility], [address] [place], [place] [address]   Surgical PCR screen     Status: None   Collection Time: 06/09/21  5:03 AM   Specimen: Nasal Mucosa; Nasal Swab  Result Value Ref Range Status   MRSA, PCR NEGATIVE NEGATIVE Final   Staphylococcus aureus NEGATIVE NEGATIVE Final    Comment: (NOTE) The Xpert SA Assay (FDA approved for NASAL specimens in patients 33 years of age and older), is one component of a comprehensive surveillance program. It is not intended to diagnose infection nor to guide or monitor treatment. Performed at [facility], [place] [address], [place], [place] [address]      Labs: BNP (last 3 results) Recent Labs    06/04/21 2013  BNP 619.5*   Basic Metabolic Panel: Recent Labs  Lab 06/06/21 0444 06/07/21 0438 06/08/21 0838 06/09/21 0455 06/10/21 0508 06/11/21 0400  NA 143 141 140 142 138 137  K 3.9 3.5 3.9 3.7 4.2 3.7  CL 108 110 106 104 100 101  CO2 26 25 24 29 30 28   GLUCOSE 196* 84 191* 51* 176* 169*  BUN 55* 60* 55* 56* 51* 41*  CREATININE 1.95* 1.73* 1.72* 1.27* 1.54* 1.25*  CALCIUM 8.0* 7.6* 8.1* 8.2* 8.0* 7.9*  MG 1.8 1.7  --   --   --   --    Liver Function Tests: Recent Labs  Lab 06/04/21 1239 06/05/21 0705  AST 21 23  ALT 19 17  ALKPHOS 55 43  BILITOT 2.3* 1.1  PROT 6.8 5.3*  ALBUMIN 3.9 2.8*   No results for input(s): LIPASE, AMYLASE in the last 168 hours. No results for input(s): AMMONIA in the last 168 hours. CBC: Recent Labs  Lab 06/04/21 1239 06/05/21 0705 06/06/21 0444 06/07/21 0438 06/08/21 0838 06/09/21 0455 06/10/21 0508 06/11/21 0400  WBC 6.0 7.3   < > 8.2 6.0 4.6 6.8 6.5  NEUTROABS 5.0 6.5  --  7.3  --  3.4  --   --   HGB 15.7 13.8   < > 13.5 15.3 13.6 14.4 13.1  HCT 48.1 43.8   < > 41.8 46.3 40.7 43.7 40.1  MCV 92.0 94.2   < > 92.7 92.2 89.5 90.5 90.3  PLT 64* 55*   < > 91* 122* 114* 142* 128*   < > = values in this interval not displayed.    Cardiac Enzymes: No results for input(s): CKTOTAL, CKMB, CKMBINDEX, TROPONINI in the last 168 hours. BNP: Invalid input(s): POCBNP CBG: Recent Labs  Lab 06/10/21 1618 06/10/21 2119 06/11/21 0016 06/11/21 0727 06/11/21 1133  GLUCAP 161* 151* 152* 123* 210*   D-Dimer No results for input(s): DDIMER in the last 72 hours. Hgb A1c No results for input(s): HGBA1C in the last 72 hours. Lipid Profile No results for input(s): CHOL, HDL, LDLCALC, TRIG, CHOLHDL, LDLDIRECT in the last 72 hours. Thyroid function studies No results for input(s): TSH, T4TOTAL, T3FREE, THYROIDAB in the last 72 hours.  Invalid input(s): FREET3 Anemia work up No results for input(s): VITAMINB12, FOLATE, FERRITIN, TIBC, IRON, RETICCTPCT in the last 72 hours. Urinalysis    Component Value Date/Time   COLORURINE YELLOW 06/04/2021 1400   APPEARANCEUR CLOUDY (A) 06/04/2021 1400   LABSPEC 1.011 06/04/2021 1400   PHURINE 5.0 06/04/2021 1400   GLUCOSEU NEGATIVE 06/04/2021 1400   HGBUR  MODERATE (A) 06/04/2021 1400   BILIRUBINUR NEGATIVE 06/04/2021 1400   BILIRUBINUR negative 05/04/2021 1702   KETONESUR 5 (A) 06/04/2021 1400   PROTEINUR >=300 (A) 06/04/2021 1400   UROBILINOGEN 1.0 05/04/2021 1702   UROBILINOGEN 0.2 06/30/2015 1552   NITRITE NEGATIVE 06/04/2021 1400   LEUKOCYTESUR LARGE (A) 06/04/2021 1400   Sepsis Labs Invalid input(s): PROCALCITONIN,  WBC,  LACTICIDVEN Microbiology Recent Results (from the past 240 hour(s))  Resp Panel by RT-PCR (Flu A&B, Covid) Nasopharyngeal Swab     Status: Abnormal   Collection Time: 06/04/21 12:23 PM   Specimen: Nasopharyngeal Swab; Nasopharyngeal(NP) swabs in vial transport medium  Result Value Ref Range Status   SARS Coronavirus 2 by RT PCR POSITIVE (A) NEGATIVE Final    Comment: CRITICAL RESULT CALLED TO, READ BACK BY AND VERIFIED WITH: CRAWFORD,H AT 1344 ON 9.12.22 BY RUCINSKI, B (NOTE) SARS-CoV-2 target nucleic acids are DETECTED.  The SARS-CoV-2 RNA is  generally detectable in upper respiratory specimens during the acute phase of infection. Positive results are indicative of the presence of the identified virus, but do not rule out bacterial infection or co-infection with other pathogens not detected by the test. Clinical correlation with patient history and other diagnostic information is necessary to determine patient infection status. The expected result is Negative.  Fact Sheet for Patients: EntrepreneurPulse.com.au  Fact Sheet for Healthcare Providers: IncredibleEmployment.be  This test is not yet approved or cleared by the [place] FDA and  has been authorized for detection and/or diagnosis of SARS-CoV-2 by FDA under an Emergency Use Authorization (EUA).  This EUA will remain in effect (mean ing this test can be used) for the duration of  the COVID-19 declaration under Section 564(b)(1) of the Act, 21 U.S.C. section 360bbb-3(b)(1), unless the authorization is terminated or revoked sooner.     Influenza A by PCR NEGATIVE NEGATIVE Final   Influenza B by PCR NEGATIVE NEGATIVE Final    Comment: (NOTE) The Xpert Xpress SARS-CoV-2/FLU/RSV plus assay is intended as an aid in the diagnosis of influenza from Nasopharyngeal swab specimens and should not be used as a sole basis for treatment. Nasal washings and aspirates are unacceptable for Xpert Xpress SARS-CoV-2/FLU/RSV testing.  Fact Sheet for Patients: EntrepreneurPulse.com.au  Fact Sheet for Healthcare Providers: IncredibleEmployment.be  This test is not yet approved or cleared by the [place] FDA and has been authorized for detection and/or diagnosis of SARS-CoV-2 by FDA under an Emergency Use Authorization (EUA). This EUA will remain in effect (meaning this test can be used) for the duration of the COVID-19 declaration under Section 564(b)(1) of the Act, 21 U.S.C. section 360bbb-3(b)(1),  unless the authorization is terminated or revoked.  Performed at [facility], [address] [place], [place] [address]   Blood Culture (routine x 2)     Status: Abnormal   Collection Time: 06/04/21 12:39 PM   Specimen: BLOOD LEFT HAND  Result Value Ref Range Status   Specimen Description   Final    BLOOD LEFT HAND BOTTLES DRAWN AEROBIC AND ANAEROBIC Performed at [facility], [address], [place], [place] [address]    Special Requests   Final    Blood Culture adequate volume Performed at [facility], [address] [place],  [address]    Culture  Setup Time   Final    ANAEROBIC BOTTLE ONLY GRAM NEGATIVE RODS Gram Stain Report Called to,Read Back By and Verified With: Belton,k@[email or web address]  by Matthews,b 9.13.22 CRITICAL RESULT CALLED TO, READ BACK BY AND VERIFIED WITH: RN  CHRISTY DAWSON 06/05/21@3 :57 BY TW  Performed at [facility], [place] [address] [place], [place] [address]    Culture ESCHERICHIA COLI (A)  Final   Report Status 06/07/2021 FINAL  Final   Organism ID, Bacteria ESCHERICHIA COLI  Final      Susceptibility   Escherichia coli - MIC*    AMPICILLIN >=32 RESISTANT Resistant     CEFAZOLIN <=4 SENSITIVE Sensitive     CEFEPIME <=0.12 SENSITIVE Sensitive     CEFTAZIDIME <=1 SENSITIVE Sensitive     CEFTRIAXONE <=0.25 SENSITIVE Sensitive     CIPROFLOXACIN <=0.25 SENSITIVE Sensitive     GENTAMICIN <=1 SENSITIVE Sensitive     IMIPENEM <=0.25 SENSITIVE Sensitive     TRIMETH/SULFA <=20 SENSITIVE Sensitive     AMPICILLIN/SULBACTAM >=32 RESISTANT Resistant     PIP/TAZO <=4 SENSITIVE Sensitive     * ESCHERICHIA COLI  Blood Culture ID Panel (Reflexed)     Status: Abnormal   Collection Time: 06/04/21 12:39 PM  Result Value Ref Range Status   Enterococcus faecalis NOT DETECTED NOT DETECTED Final   Enterococcus Faecium NOT DETECTED NOT DETECTED Final   Listeria monocytogenes NOT DETECTED NOT DETECTED Final   Staphylococcus species NOT DETECTED NOT DETECTED Final    Staphylococcus aureus (BCID) NOT DETECTED NOT DETECTED Final   Staphylococcus epidermidis NOT DETECTED NOT DETECTED Final   Staphylococcus lugdunensis NOT DETECTED NOT DETECTED Final   Streptococcus species NOT DETECTED NOT DETECTED Final   Streptococcus agalactiae NOT DETECTED NOT DETECTED Final   Streptococcus pneumoniae NOT DETECTED NOT DETECTED Final   Streptococcus pyogenes NOT DETECTED NOT DETECTED Final   A.calcoaceticus-baumannii NOT DETECTED NOT DETECTED Final   Bacteroides fragilis NOT DETECTED NOT DETECTED Final   Enterobacterales DETECTED (A) NOT DETECTED Final    Comment: Enterobacterales represent a large order of gram negative bacteria, not a single organism. CRITICAL RESULT CALLED TO, READ BACK BY AND VERIFIED WITH: RN CHRISTY DAWSON 06/05/21@3 :57 BY TW    Enterobacter cloacae complex NOT DETECTED NOT DETECTED Final   Escherichia coli DETECTED (A) NOT DETECTED Final    Comment: CRITICAL RESULT CALLED TO, READ BACK BY AND VERIFIED WITH: RN CHRISTY DAWSON 06/05/21@3 :57 BY TW    Klebsiella aerogenes NOT DETECTED NOT DETECTED Final   Klebsiella oxytoca NOT DETECTED NOT DETECTED Final   Klebsiella pneumoniae NOT DETECTED NOT DETECTED Final   Proteus species NOT DETECTED NOT DETECTED Final   Salmonella species NOT DETECTED NOT DETECTED Final   Serratia marcescens NOT DETECTED NOT DETECTED Final   Haemophilus influenzae NOT DETECTED NOT DETECTED Final   Neisseria meningitidis NOT DETECTED NOT DETECTED Final   Pseudomonas aeruginosa NOT DETECTED NOT DETECTED Final   Stenotrophomonas maltophilia NOT DETECTED NOT DETECTED Final   Candida albicans NOT DETECTED NOT DETECTED Final   Candida auris NOT DETECTED NOT DETECTED Final   Candida glabrata NOT DETECTED NOT DETECTED Final   Candida krusei NOT DETECTED NOT DETECTED Final   Candida parapsilosis NOT DETECTED NOT DETECTED Final   Candida tropicalis NOT DETECTED NOT DETECTED Final   Cryptococcus neoformans/gattii NOT DETECTED NOT  DETECTED Final   CTX-M ESBL NOT DETECTED NOT DETECTED Final   Carbapenem resistance IMP NOT DETECTED NOT DETECTED Final   Carbapenem resistance KPC NOT DETECTED NOT DETECTED Final   Carbapenem resistance NDM NOT DETECTED NOT DETECTED Final   Carbapenem resist OXA 48 LIKE NOT DETECTED NOT DETECTED Final   Carbapenem resistance VIM NOT DETECTED NOT DETECTED Final    Comment: Performed at [facility], [address] [place], [place] [address]  Blood Culture (routine x 2)     Status: Abnormal   Collection Time: 06/04/21 12:40 PM   Specimen: BLOOD RIGHT FOREARM  Result Value Ref Range Status   Specimen Description   Final    BLOOD RIGHT FOREARM BOTTLES DRAWN AEROBIC AND ANAEROBIC Performed at [facility], [address], [place], [place] [address]    Special Requests   Final    Blood Culture adequate volume Performed at [facility], [address], [place], [place] [address]    Culture  Setup Time   Final    ANAEROBIC BOTTLE ONLY GRAM NEGATIVE RODS Gram Stain Report Called to,Read Back By and Verified With: SPENCE,[id number]  BY MATTHEWS, B 9.13.22 Performed at [facility], [address], [place], [place] [address]    Culture (A)  Final    ESCHERICHIA COLI SUSCEPTIBILITIES PERFORMED ON PREVIOUS CULTURE WITHIN THE LAST 5 DAYS. Performed at [facility], [place] [address] [place], [place] [address]    Report Status 06/07/2021 FINAL  Final  Urine Culture     Status: Abnormal   Collection Time: 06/04/21  2:00 PM   Specimen: In/Out Cath Urine  Result Value Ref Range Status   Specimen Description   Final    IN/OUT CATH URINE Performed at [facility], [address] [place], [place] [address]    Special Requests   Final    NONE Performed at [facility], [address] [place], [place] [address]    Culture >=100,000 COLONIES/mL ESCHERICHIA COLI (A)  Final   Report Status 06/07/2021 FINAL  Final   Organism ID, Bacteria ESCHERICHIA COLI (A)  Final      Susceptibility    Escherichia coli - MIC*    AMPICILLIN >=32 RESISTANT Resistant     CEFAZOLIN <=4 SENSITIVE Sensitive     CEFEPIME <=0.12 SENSITIVE Sensitive     CEFTRIAXONE <=0.25 SENSITIVE Sensitive     CIPROFLOXACIN <=0.25 SENSITIVE Sensitive     GENTAMICIN <=1 SENSITIVE Sensitive     IMIPENEM <=0.25 SENSITIVE Sensitive     NITROFURANTOIN <=16 SENSITIVE Sensitive     TRIMETH/SULFA <=20 SENSITIVE Sensitive     AMPICILLIN/SULBACTAM >=32 RESISTANT Resistant     PIP/TAZO <=4 SENSITIVE Sensitive     * >=100,000 COLONIES/mL ESCHERICHIA COLI  Culture, blood (routine x 2)     Status: None   Collection Time: 06/05/21 12:43 PM   Specimen: BLOOD LEFT HAND  Result Value Ref Range Status   Specimen Description BLOOD LEFT HAND  Final   Special Requests   Final    BOTTLES DRAWN AEROBIC AND ANAEROBIC Blood Culture adequate volume   Culture   Final    NO GROWTH 5 DAYS Performed at [facility], [address] [place], [place] [address]    Report Status 06/10/2021 FINAL  Final  Culture, blood (routine x 2)     Status: None   Collection Time: 06/05/21 12:43 PM   Specimen: BLOOD RIGHT HAND  Result Value Ref Range Status   Specimen Description BLOOD RIGHT HAND  Final   Special Requests   Final    BOTTLES DRAWN AEROBIC AND ANAEROBIC Blood Culture results may not be optimal due to an excessive volume of blood received in culture bottles   Culture   Final    NO GROWTH 5 DAYS Performed at [facility], [address], [place], [place] [address]    Report Status 06/10/2021 FINAL  Final  MRSA Next Gen by  PCR, Nasal     Status: None   Collection Time: 06/05/21  4:07 PM   Specimen: Nasal Mucosa; Nasal Swab  Result Value Ref Range Status   MRSA by PCR Next Gen NOT DETECTED NOT DETECTED Final    Comment: (NOTE) The GeneXpert MRSA Assay (FDA approved for NASAL specimens only), is one component of a comprehensive MRSA colonization surveillance program. It is not intended to diagnose MRSA infection nor to guide or  monitor treatment for MRSA infections. Test performance is not FDA approved in patients less than 21 years old. Performed at [facility], [address], [place], [place] [address]   Surgical PCR screen     Status: None   Collection Time: 06/09/21  5:03 AM   Specimen: Nasal Mucosa; Nasal Swab  Result Value Ref Range Status   MRSA, PCR NEGATIVE NEGATIVE Final   Staphylococcus aureus NEGATIVE NEGATIVE Final    Comment: (NOTE) The Xpert SA Assay (FDA approved for NASAL specimens in patients 17 years of age and older), is one component of a comprehensive surveillance program. It is not intended to diagnose infection nor to guide or monitor treatment. Performed at [facility], [place] [address], [place],  [address]    Time coordinating discharge: Over 30 minutes  SIGNED:  Little Ishikawa, DO [facility] 06/11/2021, 12:19 PM Pager   If 7PM-7AM, please contact night-coverage [email or web address]

## 2021-06-11 NOTE — Plan of Care (Signed)
  Problem: Education: Goal: Knowledge of General Education information will improve Description Including pain rating scale, medication(s)/side effects and non-pharmacologic comfort measures Outcome: Progressing   

## 2021-06-11 NOTE — Progress Notes (Signed)
PT Note  Patient Details Name: [name] MRN: [id number] DOB: 07-18-1935     See new order for PT, however we assessed pt on 06/08/2021 and pt at baseline with supervision and no AD. Encourged mobility with nursing at this level . No PT need. See note from 06/08/2021 unless pt has had a medical change. thank you    Clide Dales 06/11/2021, 11:49 AM Gatha Mayer, PT, MPT Acute Rehabilitation Services Office: [phone] Pager: [phone] 06/11/2021

## 2021-06-11 NOTE — Progress Notes (Signed)
Urology Inpatient Progress Report  AKI (acute kidney injury) ([place]) [N17.9] Acute kidney injury ([place]) [N17.9] Acute hypoxemic respiratory failure (HCC) [J96.01] COVID-19 [U07.1]  Procedure(s): CYSTOSCOPY REMOVAL OF ARTIFICIAL URINARY SPHINCER; REMOVAL OF INFLATABLE PENILE PROSTHESIS  2 Days Post-Op   Intv/Subj: No acute events overnight. Patient having some penile and scrotal pain as expected.  Urine clear yellow and creatinine improving.  Principal Problem:   Acute kidney injury ([place]) Active Problems:   Mixed hyperlipidemia   Hypertension   Coronary atherosclerosis of native coronary artery   GERD   Mantle cell lymphoma (HCC)   Prostate cancer (HCC)   Thrombocytopenia, unspecified ([place])   B12 deficiency   Chronic kidney disease, stage 3b (HCC)   Mild cognitive impairment   Type 2 diabetes mellitus ([place])   COVID-19 virus infection  Current Facility-Administered Medications  Medication Dose Route Frequency Provider Last Rate Last Admin   acetaminophen (TYLENOL) tablet 650 mg  650 mg Oral Q6H PRN Donnamae Jude, MD   650 mg at 06/04/21 1733   Or   acetaminophen (TYLENOL) suppository 650 mg  650 mg Rectal Q6H PRN Donnamae Jude, MD       albuterol (VENTOLIN HFA) 108 (90 Base) MCG/ACT inhaler 2 puff  2 puff Inhalation QHS PRN Donnamae Jude, MD   2 puff at 06/08/21 0753   amLODipine (NORVASC) tablet 5 mg  5 mg Oral Daily Johnson, Clanford L, MD   5 mg at 06/10/21 0817   aspirin EC tablet 81 mg  81 mg Oral Daily Johnson, Clanford L, MD   81 mg at 06/10/21 0817   atorvastatin (LIPITOR) tablet 20 mg  20 mg Oral Daily Johnson, Clanford L, MD   20 mg at 06/10/21 0817   cephALEXin (KEFLEX) capsule 500 mg  500 mg Oral Q6H Little Ishikawa, MD   500 mg at 06/11/21 0616   chlorhexidine (PERIDEX) 0.12 % solution 15 mL  15 mL Mouth Rinse BID Adefeso, Oladapo, DO   15 mL at 06/10/21 2326   Chlorhexidine Gluconate Cloth 2 % PADS 6 each  6 each Topical Daily Heath Lark D, DO   6 each  at 06/10/21 8937   docusate sodium (COLACE) capsule 200 mg  200 mg Oral Daily PRN Donnamae Jude, MD       fluticasone (FLONASE) 50 MCG/ACT nasal spray 1 spray  1 spray Each Nare PRN Donnamae Jude, MD   1 spray at 06/08/21 0819   HYDROmorphone (DILAUDID) injection 0.5-1 mg  0.5-1 mg Intravenous Q3H PRN Little Ishikawa, MD   1 mg at 06/10/21 1629   insulin aspart (novoLOG) injection 0-15 Units  0-15 Units Subcutaneous TID WC Johnson, Clanford L, MD   3 Units at 06/10/21 1629   insulin glargine-yfgn (SEMGLEE) injection 25 Units  25 Units Subcutaneous Marvia Pickles, Clanford L, MD   25 Units at 06/10/21 0959   ipratropium (ATROVENT) 0.06 % nasal spray 2 spray  2 spray Each Nare BID Donnamae Jude, MD   2 spray at 06/10/21 2327   labetalol (NORMODYNE) injection 10 mg  10 mg Intravenous Q2H PRN Johnson, Clanford L, MD       lactated ringers infusion   Intravenous Continuous Wynetta Emery, Clanford L, MD 50 mL/hr at 06/10/21 2334 New Bag at 06/10/21 2334   LORazepam (ATIVAN) injection 1 mg  1 mg Intravenous Q6H PRN Donnamae Jude, MD   1 mg at 06/11/21 0107   MEDLINE mouth rinse  15 mL Mouth Rinse  [id number] Adefeso, Oladapo, DO   15 mL at 06/10/21 1530   mometasone-formoterol (DULERA) 200-5 MCG/ACT inhaler 2 puff  2 puff Inhalation BID Donnamae Jude, MD   2 puff at 06/11/21 0729   mupirocin ointment (BACTROBAN) 2 % 1 application  1 application Nasal BID Wynetta Emery, Clanford L, MD   1 application at 14/97/02 2326   ondansetron (ZOFRAN) injection 4 mg  4 mg Intravenous Q6H PRN Little Ishikawa, MD   4 mg at 06/11/21 0020   ondansetron (ZOFRAN) tablet 4 mg  4 mg Oral Q6H PRN Little Ishikawa, MD   4 mg at 06/10/21 1530   oxybutynin (DITROPAN) tablet 5 mg  5 mg Oral Q8H PRN Zierle-Ghosh, Asia B, DO   5 mg at 06/08/21 0625   pantoprazole (PROTONIX) EC tablet 40 mg  40 mg Oral Q0600 Johnson, Clanford L, MD   40 mg at 06/11/21 0615   polyethylene glycol (MIRALAX / GLYCOLAX) packet 17 g  17 g Oral Daily PRN  Donnamae Jude, MD       propranolol ER (INDERAL LA) 24 hr capsule 80 mg  80 mg Oral QHS Johnson, Clanford L, MD   80 mg at 06/10/21 2326   Facility-Administered Medications Ordered in Other Encounters  Medication Dose Route Frequency Provider Last Rate Last Admin   heparin lock flush 100 unit/mL  500 Units Intravenous Once Kefalas, Thomas S, PA-C       sodium chloride flush (NS) 0.9 % injection 20 mL  20 mL Intravenous PRN Baird Cancer, PA-C         Objective: Vital: Vitals:   06/10/21 2039 06/11/21 0014 06/11/21 0440 06/11/21 0731  BP: 125/77 129/75 135/84   Pulse: (!) 57 62 (!) 58   Resp: 18  18   Temp: 97.9 F (36.6 C)  97.8 F (36.6 C)   TempSrc: Oral  Oral   SpO2: 92%  90% 93%  Weight:      Height:       I/Os: I/O last 3 completed shifts: In: 280 [P.O.:280] Out: 4110 [Urine:4050; Drains:60]  Physical Exam:  General: Patient is in no apparent distress Lungs: Normal respiratory effort, chest expands symmetrically. GI: The abdomen is soft and nontender without mass. JP drain with scant drainage Foley: Clear yellow urine Genitourinary: Incision clean dry and intact.  Penile edema improving.  Foley catheter in place.  No scrotal edema Ext: lower extremities symmetric  Lab Results: Recent Labs    06/09/21 0455 06/10/21 0508 06/11/21 0400  WBC 4.6 6.8 6.5  HGB 13.6 14.4 13.1  HCT 40.7 43.7 40.1   Recent Labs    06/08/21 0838 06/10/21 0508 06/11/21 0400  NA 140 138 137  K 3.9 4.2 3.7  CL 106 100 101  CO2 24 30 28   GLUCOSE 191* 176* 169*  BUN 55* 51* 41*  CREATININE 1.72* 1.54* 1.25*  CALCIUM 8.1* 8.0* 7.9*   No results for input(s): LABPT, INR in the last 72 hours. No results for input(s): LABURIN in the last 72 hours. Results for orders placed or performed during the hospital encounter of 06/04/21  Resp Panel by RT-PCR (Flu A&B, Covid) Nasopharyngeal Swab     Status: Abnormal   Collection Time: 06/04/21 12:23 PM   Specimen: Nasopharyngeal Swab;  Nasopharyngeal(NP) swabs in vial transport medium  Result Value Ref Range Status   SARS Coronavirus 2 by RT PCR POSITIVE (A) NEGATIVE Final    Comment: CRITICAL RESULT CALLED TO, READ BACK BY AND  VERIFIED WITH: CRAWFORD,H AT 1344 ON 9.12.22 BY RUCINSKI, B (NOTE) SARS-CoV-2 target nucleic acids are DETECTED.  The SARS-CoV-2 RNA is generally detectable in upper respiratory specimens during the acute phase of infection. Positive results are indicative of the presence of the identified virus, but do not rule out bacterial infection or co-infection with other pathogens not detected by the test. Clinical correlation with patient history and other diagnostic information is necessary to determine patient infection status. The expected result is Negative.  Fact Sheet for Patients: EntrepreneurPulse.com.au  Fact Sheet for Healthcare Providers: IncredibleEmployment.be  This test is not yet approved or cleared by the [place] FDA and  has been authorized for detection and/or diagnosis of SARS-CoV-2 by FDA under an Emergency Use Authorization (EUA).  This EUA will remain in effect (mean ing this test can be used) for the duration of  the COVID-19 declaration under Section 564(b)(1) of the Act, 21 U.S.C. section 360bbb-3(b)(1), unless the authorization is terminated or revoked sooner.     Influenza A by PCR NEGATIVE NEGATIVE Final   Influenza B by PCR NEGATIVE NEGATIVE Final    Comment: (NOTE) The Xpert Xpress SARS-CoV-2/FLU/RSV plus assay is intended as an aid in the diagnosis of influenza from Nasopharyngeal swab specimens and should not be used as a sole basis for treatment. Nasal washings and aspirates are unacceptable for Xpert Xpress SARS-CoV-2/FLU/RSV testing.  Fact Sheet for Patients: EntrepreneurPulse.com.au  Fact Sheet for Healthcare Providers: IncredibleEmployment.be  This test is not yet approved or  cleared by the [place] FDA and has been authorized for detection and/or diagnosis of SARS-CoV-2 by FDA under an Emergency Use Authorization (EUA). This EUA will remain in effect (meaning this test can be used) for the duration of the COVID-19 declaration under Section 564(b)(1) of the Act, 21 U.S.C. section 360bbb-3(b)(1), unless the authorization is terminated or revoked.  Performed at [facility], [address] [place], [place] [address]   Blood Culture (routine x 2)     Status: Abnormal   Collection Time: 06/04/21 12:39 PM   Specimen: BLOOD LEFT HAND  Result Value Ref Range Status   Specimen Description   Final    BLOOD LEFT HAND BOTTLES DRAWN AEROBIC AND ANAEROBIC Performed at [facility], [address], [place], [place] [address]    Special Requests   Final    Blood Culture adequate volume Performed at [facility], [address], [place], [place] [address]    Culture  Setup Time   Final    ANAEROBIC BOTTLE ONLY GRAM NEGATIVE RODS Gram Stain Report Called to,Read Back By and Verified With: Belton,k@[email or web address]  by Matthews,b 9.13.22 CRITICAL RESULT CALLED TO, READ BACK BY AND VERIFIED WITH: RN CHRISTY DAWSON 06/05/21@3 :57 BY TW  Performed at [facility], [place] [address] [place], [place] [address]    Culture ESCHERICHIA COLI (A)  Final   Report Status 06/07/2021 FINAL  Final   Organism ID, Bacteria ESCHERICHIA COLI  Final      Susceptibility   Escherichia coli - MIC*    AMPICILLIN >=32 RESISTANT Resistant     CEFAZOLIN <=4 SENSITIVE Sensitive     CEFEPIME <=0.12 SENSITIVE Sensitive     CEFTAZIDIME <=1 SENSITIVE Sensitive     CEFTRIAXONE <=0.25 SENSITIVE Sensitive     CIPROFLOXACIN <=0.25 SENSITIVE Sensitive     GENTAMICIN <=1 SENSITIVE Sensitive     IMIPENEM <=0.25 SENSITIVE Sensitive     TRIMETH/SULFA <=20 SENSITIVE Sensitive     AMPICILLIN/SULBACTAM >=32 RESISTANT Resistant  PIP/TAZO <=4 SENSITIVE Sensitive     * ESCHERICHIA COLI  Blood Culture ID Panel  (Reflexed)     Status: Abnormal   Collection Time: 06/04/21 12:39 PM  Result Value Ref Range Status   Enterococcus faecalis NOT DETECTED NOT DETECTED Final   Enterococcus Faecium NOT DETECTED NOT DETECTED Final   Listeria monocytogenes NOT DETECTED NOT DETECTED Final   Staphylococcus species NOT DETECTED NOT DETECTED Final   Staphylococcus aureus (BCID) NOT DETECTED NOT DETECTED Final   Staphylococcus epidermidis NOT DETECTED NOT DETECTED Final   Staphylococcus lugdunensis NOT DETECTED NOT DETECTED Final   Streptococcus species NOT DETECTED NOT DETECTED Final   Streptococcus agalactiae NOT DETECTED NOT DETECTED Final   Streptococcus pneumoniae NOT DETECTED NOT DETECTED Final   Streptococcus pyogenes NOT DETECTED NOT DETECTED Final   A.calcoaceticus-baumannii NOT DETECTED NOT DETECTED Final   Bacteroides fragilis NOT DETECTED NOT DETECTED Final   Enterobacterales DETECTED (A) NOT DETECTED Final    Comment: Enterobacterales represent a large order of gram negative bacteria, not a single organism. CRITICAL RESULT CALLED TO, READ BACK BY AND VERIFIED WITH: RN CHRISTY DAWSON 06/05/21@3 :57 BY TW    Enterobacter cloacae complex NOT DETECTED NOT DETECTED Final   Escherichia coli DETECTED (A) NOT DETECTED Final    Comment: CRITICAL RESULT CALLED TO, READ BACK BY AND VERIFIED WITH: RN CHRISTY DAWSON 06/05/21@3 :57 BY TW    Klebsiella aerogenes NOT DETECTED NOT DETECTED Final   Klebsiella oxytoca NOT DETECTED NOT DETECTED Final   Klebsiella pneumoniae NOT DETECTED NOT DETECTED Final   Proteus species NOT DETECTED NOT DETECTED Final   Salmonella species NOT DETECTED NOT DETECTED Final   Serratia marcescens NOT DETECTED NOT DETECTED Final   Haemophilus influenzae NOT DETECTED NOT DETECTED Final   Neisseria meningitidis NOT DETECTED NOT DETECTED Final   Pseudomonas aeruginosa NOT DETECTED NOT DETECTED Final   Stenotrophomonas maltophilia NOT DETECTED NOT DETECTED Final   Candida albicans NOT  DETECTED NOT DETECTED Final   Candida auris NOT DETECTED NOT DETECTED Final   Candida glabrata NOT DETECTED NOT DETECTED Final   Candida krusei NOT DETECTED NOT DETECTED Final   Candida parapsilosis NOT DETECTED NOT DETECTED Final   Candida tropicalis NOT DETECTED NOT DETECTED Final   Cryptococcus neoformans/gattii NOT DETECTED NOT DETECTED Final   CTX-M ESBL NOT DETECTED NOT DETECTED Final   Carbapenem resistance IMP NOT DETECTED NOT DETECTED Final   Carbapenem resistance KPC NOT DETECTED NOT DETECTED Final   Carbapenem resistance NDM NOT DETECTED NOT DETECTED Final   Carbapenem resist OXA 48 LIKE NOT DETECTED NOT DETECTED Final   Carbapenem resistance VIM NOT DETECTED NOT DETECTED Final    Comment: Performed at [facility], [address], [place], [place] [address]  Blood Culture (routine x 2)     Status: Abnormal   Collection Time: 06/04/21 12:40 PM   Specimen: BLOOD RIGHT FOREARM  Result Value Ref Range Status   Specimen Description   Final    BLOOD RIGHT FOREARM BOTTLES DRAWN AEROBIC AND ANAEROBIC Performed at [facility], [address], [place], [place] [address]    Special Requests   Final    Blood Culture adequate volume Performed at [facility], [address], [place], [place] [address]    Culture  Setup Time   Final    ANAEROBIC BOTTLE ONLY GRAM NEGATIVE RODS Gram Stain Report Called to,Read Back By and Verified With: SPENCE,[email or web address]  BY MATTHEWS, B 9.13.22 Performed at [facility], [address], [place], [place] [address]  Culture (A)  Final    ESCHERICHIA COLI SUSCEPTIBILITIES PERFORMED ON PREVIOUS CULTURE WITHIN THE LAST 5 DAYS. Performed at [facility], [place] [address] [place], [place] [address]    Report Status 06/07/2021 FINAL  Final  Urine Culture     Status: Abnormal   Collection Time: 06/04/21  2:00 PM   Specimen: In/Out Cath Urine  Result Value Ref Range Status   Specimen Description   Final    IN/OUT CATH URINE Performed at [facility], [address], [place], [place] [address]    Special Requests   Final    NONE Performed at [facility], [address], [place], [place] [address]    Culture >=100,000 COLONIES/mL ESCHERICHIA COLI (A)  Final   Report Status 06/07/2021 FINAL  Final   Organism ID, Bacteria ESCHERICHIA COLI (A)  Final      Susceptibility   Escherichia coli - MIC*    AMPICILLIN >=32 RESISTANT Resistant     CEFAZOLIN <=4 SENSITIVE Sensitive     CEFEPIME <=0.12 SENSITIVE Sensitive     CEFTRIAXONE <=0.25 SENSITIVE Sensitive     CIPROFLOXACIN <=0.25 SENSITIVE Sensitive     GENTAMICIN <=1 SENSITIVE Sensitive     IMIPENEM <=0.25 SENSITIVE Sensitive     NITROFURANTOIN <=16 SENSITIVE Sensitive     TRIMETH/SULFA <=20 SENSITIVE Sensitive     AMPICILLIN/SULBACTAM >=32 RESISTANT Resistant     PIP/TAZO <=4 SENSITIVE Sensitive     * >=100,000 COLONIES/mL ESCHERICHIA COLI  Culture, blood (routine x 2)     Status: None   Collection Time: 06/05/21 12:43 PM   Specimen: BLOOD LEFT HAND  Result Value Ref Range Status   Specimen Description BLOOD LEFT HAND  Final   Special Requests   Final    BOTTLES DRAWN AEROBIC AND ANAEROBIC Blood Culture adequate volume   Culture   Final    NO GROWTH 5 DAYS Performed at [facility], [address], [place], [place] [address]    Report Status 06/10/2021 FINAL  Final  Culture, blood (routine x 2)     Status: None   Collection Time: 06/05/21 12:43 PM   Specimen: BLOOD RIGHT HAND  Result Value Ref Range Status   Specimen Description BLOOD RIGHT HAND  Final   Special Requests   Final    BOTTLES DRAWN AEROBIC AND ANAEROBIC Blood Culture results may not be optimal due to an excessive volume of blood received in culture bottles   Culture   Final    NO GROWTH 5 DAYS Performed at [facility], [address], [place], [place] [address]    Report Status 06/10/2021 FINAL  Final  MRSA Next Gen by PCR, Nasal     Status: None   Collection Time: 06/05/21  4:07 PM   Specimen: Nasal  Mucosa; Nasal Swab  Result Value Ref Range Status   MRSA by PCR Next Gen NOT DETECTED NOT DETECTED Final    Comment: (NOTE) The GeneXpert MRSA Assay (FDA approved for NASAL specimens only), is one component of a comprehensive MRSA colonization surveillance program. It is not intended to diagnose MRSA infection nor to guide or monitor treatment for MRSA infections. Test performance is not FDA approved in patients less than 9 years old. Performed at [facility], [address], [place], [place] [address]   Surgical PCR screen     Status: None   Collection Time: 06/09/21  5:03 AM   Specimen: Nasal Mucosa; Nasal Swab  Result Value Ref Range Status  MRSA, PCR NEGATIVE NEGATIVE Final   Staphylococcus aureus NEGATIVE NEGATIVE Final    Comment: (NOTE) The Xpert SA Assay (FDA approved for NASAL specimens in patients 39 years of age and older), is one component of a comprehensive surveillance program. It is not intended to diagnose infection nor to guide or monitor treatment. Performed at [facility], [place] [address] [place], [place] [address]     Studies/Results: No results found.  Assessment: Urethral injury with likely eroded artificial urinary sphincter  Procedure(s): CYSTOSCOPY REMOVAL OF ARTIFICIAL URINARY SPHINCER; REMOVAL OF INFLATABLE PENILE PROSTHESIS, 2 Days Post-Op  doing well.  Plan: Okay for discharge from my standpoint with Foley catheter.  He will need to follow-up with a pericatheter retrograde urethrogram in 3 to 4 weeks.  Would recommend 10 more days of oral antibiotic such as Keflex followed by nightly 250 mg Keflex for UTI prophylaxis until the catheter is removed.   Link Snuffer, MD Urology 06/11/2021, 8:29 AM

## 2021-06-11 NOTE — Progress Notes (Signed)
Pt repeatedly out of bed, staff RN to bedside reminded pt to call before getting up and educated on importance of following safety measures. Pt requested bed alarm be turned off. Rn again educated pt on hospital safety protocol and that alarm must stay on for his safety. As Rn speaking pt stubbornly got up again from bed, leaning on furniture to go to restroom. RN followed behind closely to make certain pt walked to restroom and later to recliner. Pt seated in recliner comfortably,chair alarm turned on, pt denied needing any other assistance. Pt primary care Rn and nursing assistant updated.

## 2021-06-12 DIAGNOSIS — N179 Acute kidney failure, unspecified: Secondary | ICD-10-CM | POA: Diagnosis not present

## 2021-06-12 LAB — BASIC METABOLIC PANEL
Anion gap: 7 (ref 5–15)
BUN: 32 mg/dL — ABNORMAL HIGH (ref 8–23)
CO2: 29 mmol/L (ref 22–32)
Calcium: 8 mg/dL — ABNORMAL LOW (ref 8.9–10.3)
Chloride: 100 mmol/L (ref 98–111)
Creatinine, Ser: 1.31 mg/dL — ABNORMAL HIGH (ref 0.61–1.24)
GFR, Estimated: 54 mL/min — ABNORMAL LOW (ref >60)
Glucose, Bld: 144 mg/dL — ABNORMAL HIGH (ref 70–99)
Potassium: 3.8 mmol/L (ref 3.5–5.1)
Sodium: 136 mmol/L (ref 135–145)

## 2021-06-12 LAB — CBC
HCT: 39.9 % (ref 39.0–52.0)
Hemoglobin: 13.2 g/dL (ref 13.0–17.0)
MCH: 29.6 pg (ref 26.0–34.0)
MCHC: 33.1 g/dL (ref 30.0–36.0)
MCV: 89.5 fL (ref 80.0–100.0)
Platelets: 143 10*3/uL — ABNORMAL LOW (ref 150–400)
RBC: 4.46 MIL/uL (ref 4.22–5.81)
RDW: 15.4 % (ref 11.5–15.5)
WBC: 8 10*3/uL (ref 4.0–10.5)
nRBC: 0 % (ref 0.0–0.2)

## 2021-06-12 LAB — GLUCOSE, CAPILLARY
Glucose-Capillary: 123 mg/dL — ABNORMAL HIGH (ref 70–99)
Glucose-Capillary: 115 mg/dL — ABNORMAL HIGH (ref 70–99)
Glucose-Capillary: 243 mg/dL — ABNORMAL HIGH (ref 70–99)
Glucose-Capillary: 207 mg/dL — ABNORMAL HIGH (ref 70–99)

## 2021-06-12 NOTE — Plan of Care (Signed)

## 2021-06-12 NOTE — Progress Notes (Signed)
Physical Therapy Treatment Patient Details Name: [name] MRN: [id number] DOB: Jul 22, 1935 Today's Date: 06/12/2021   History of Present Illness [name] is a 86 y.o. adm to [facility] with c/o weakness, dx with sepsis 2* UTI, resultant AKI.  transferred to [facility] and is now s/p  CYSTOSCOPY  REMOVAL OF ARTIFICIAL URINARY SPHINCER; REMOVAL OF INFLATABLE PENILE PROSTHESIS (2 Days Post-Op) PMH: CAD, HLD, DM, HTN, CKD, B12 deficiency, mantle cell lymphoma of the colon, GERD, PVD, prostate CA, OSA not on CPAP who was seen in the ED on 8/27 with a positive COVID test.    PT Comments    Progressing with mobility. Pt was pleasant and out of restraints during this session. He walked a short distance then requested to return to the room. He did report fatigue. No family present during session. Recommendation has been updated to HHPT f/u. Pt appeared less steady on today.     Recommendations for follow up therapy are one component of a multi-disciplinary discharge planning process, led by the attending physician.  Recommendations may be updated based on patient status, additional functional criteria and insurance authorization.  Follow Up Recommendations  Home health PT;Supervision - Intermittent     Equipment Recommendations  None recommended by PT    Recommendations for Other Services       Precautions / Restrictions Precautions Precautions: Fall Restrictions Weight Bearing Restrictions: No     Mobility  Bed Mobility Overal bed mobility: Needs Assistance Bed Mobility: Supine to Sit     Supine to sit: Supervision     General bed mobility comments: Supv for safety. Increased time.    Transfers Overall transfer level: Needs assistance Equipment used: None Transfers: Sit to/from Stand Sit to Stand: Min assist         General transfer comment: Unsteady. Wide BOS  Ambulation/Gait Ambulation/Gait assistance: Min assist Gait Distance (Feet): 60 Feet Assistive device:  Rolling walker (2 wheeled) Gait Pattern/deviations: Step-through pattern;Decreased stride length     General Gait Details: Min A to manage RW safely intermittently. Otherwise, just close Min guared. Pt fatigues easily.   Stairs             Wheelchair Mobility    Modified Rankin (Stroke Patients Only)       Balance Overall balance assessment: Needs assistance         Standing balance support: Bilateral upper extremity supported;During functional activity Standing balance-Leahy Scale: Fair                              Cognition Arousal/Alertness: Awake/alert Behavior During Therapy: WFL for tasks assessed/performed Overall Cognitive Status: No family/caregiver present to determine baseline cognitive functioning Area of Impairment: Safety/judgement;Problem solving                             Problem Solving: Requires verbal cues General Comments: still some confusion      Exercises      General Comments        Pertinent Vitals/Pain Pain Assessment: No/denies pain    Home Living                      Prior Function            PT Goals (current goals can now be found in the care plan section) Acute Rehab PT Goals Time For Goal Achievement: 06/25/21 Progress towards PT  goals: Progressing toward goals    Frequency    Min 3X/week      PT Plan Current plan remains appropriate    Co-evaluation              AM-PAC PT "6 Clicks" Mobility   Outcome Measure  Help needed turning from your back to your side while in a flat bed without using bedrails?: A Little Help needed moving from lying on your back to sitting on the side of a flat bed without using bedrails?: A Little Help needed moving to and from a bed to a chair (including a wheelchair)?: A Little Help needed standing up from a chair using your arms (e.g., wheelchair or bedside chair)?: A Little Help needed to walk in hospital room?: A Little Help needed  climbing 3-5 steps with a railing? : A Little 6 Click Score: 18    End of Session   Activity Tolerance: Patient limited by fatigue Patient left: in chair;with call bell/phone within reach;with chair alarm set   PT Visit Diagnosis: Difficulty in walking, not elsewhere classified (R26.2);Muscle weakness (generalized) (M62.81)     Time: 4462-8638 PT Time Calculation (min) (ACUTE ONLY): 9 min  Charges:  $Gait Training: 8-22 mins              Doreatha Massed, PT Acute Rehabilitation  Office: [phone] Pager: [phone]

## 2021-06-12 NOTE — TOC Transition Note (Addendum)
Transition of Care [facility]) - CM/SW Discharge Note   Patient Details  Name: YOVANNI FRENETTE MRN: [id number] Date of Birth: Nov 04, 1934  [facility] CM/SW Contact:  Dessa Phi, RN Phone Number: 06/12/2021, 9:26 AM   Clinical Narrative:d/c home. Referral for f/c care @ home-informed nsg to instruct family while in hospital how to manage f/c-this is not a skill for insurance benefit to cover. PT-no f/c.No further CM needs.   10:38a- I have spoken both-Linda & Donna-they both understood the process & in agreement for nsg to teach while in hospital f/c care, & DM ed-glucometer-[place] please contact Vaughan Basta to set up time for both of them to come in for teaching, & d/c instruction. There are no skills currently for [facility] services-there is no wound care with treatment needed;f/c care is not a skill unless family unable to learn, DME ED to instruct on glucometer.Please document return demo.           Patient Goals and CMS Choice        Discharge Placement                       Discharge Plan and Services                                     Social Determinants of Health (SDOH) Interventions     Readmission Risk Interventions No flowsheet data found.

## 2021-06-12 NOTE — Progress Notes (Signed)
AVS and discharge instructions reviewed with pt., daughter, and wife. Foley care and JP site dressing changes demonstrated and extra supplies sent home for use. Pt., daughter, wife verbalized understanding and had no further questions.

## 2021-06-12 NOTE — Discharge Summary (Signed)
Physician Discharge Summary  [name] HFW:[id number] DOB: 03-Jan-1935 DOA: 06/04/2021  PCP: Celene Squibb, MD  Admit date: 06/04/2021 Discharge date: 06/12/2021  Admitted From: Home Disposition: Home  Recommendations for Outpatient Follow-up:  Follow up with PCP in 1-2 weeks Please obtain BMP/CBC in one week Please follow up with urology in 1 to 2 weeks as scheduled  Home Health: Nursing Equipment/Devices: None  Discharge Condition: Stable CODE STATUS: Full Diet recommendation: As tolerated low-salt low-fat low-carb diet  Brief/Interim Summary: [name] is a 86 y.o. male with medical history significant of CAD, HLD, DM, HTN, CKD, B12 deficiency, mantle cell lymphoma of the colon, GERD, PVD, prostate CA, OSA not on CPAP who notably positive for covid on 8/27 and completed treatment previously. Admitted for Sepsis 2/2 UTI with concurrent bacteremia.  Patient's pain resolved with supportive care, after further discussion with family they are able to manage patient's comorbid conditions including point-of-care glucose checks, dressing changes and Foley bag emptying and monitoring.  He is otherwise stable and agreeable for discharge home.   Assessment & Plan:   Severe Sepsis (POA) secondary to E. coli bacteremia related to UTI with noted AKI, resolving -Continue keflex per Urology -Foley continued for 2-3 weeks per urology -Urine culture with findings of E coli infection  -Repeat blood cultures no growth to date   Acute metabolic encephalopathy secondary to above in the setting of mild cognitive impairment, resolved - Discontinue sedating agents such as gabapentin and temazepam - CT head with no acute findings - Back to baseline   Acute urinary retention Penile edema - Foley placed - Urology to follow outpatient - Status post cystoscopy with removal of artificial urinary sphincter and inflatable penile prosthesis 06/09/21 - flomax ongoing   AKI on CKD stage IIIb -  improving -Likely prerenal in the setting of sepsis -resolving   Acute hypoxemic respiratory failure, resolved -Likely secondary to mental status and severe sepsis, resolved   COVID-19 positive without acute infection -Patient is well outside the window for acute COVID infection given positive on 05/19/2021 status posttreatment with paxlovid -no longer on steroids   Thrombocytopenia-acute on chronic -Likely secondary to sepsis -Continue to monitor in the outpatient setting   Type 2 diabetes with hyperglycemia -A1c 9.2% -Resume home medications, lengthy discussion about need for dietary compliance in the setting of uncontrolled diabetes with hyperglycemia   Hypertension - better controlled -Continue home BP medications   History of CAD -Continue home meds   History of mantle cell lymphoma -Has not seen hematology since 2019  Discharge Instructions  Discharge Instructions     Diet - low sodium heart healthy   Complete by: As directed    Increase activity slowly   Complete by: As directed       Allergies as of 06/12/2021       Reactions   Bee Venom Anaphylaxis   Adhesive [tape] Hives   Ciprofloxacin    Unknown    Codeine    Unknown    Iodine    Unknown    Latex    Unknown    Povidone-iodine    Unknown    Simvastatin    Unknown         Medication List     STOP taking these medications    aspirin EC 81 MG tablet   atorvastatin 20 MG tablet Commonly known as: LIPITOR       TAKE these medications    Accu-Chek FastClix Lancets Misc Apply topically.  Accu-Chek Guide test strip Generic drug: glucose blood   acetaminophen 325 MG tablet Commonly known as: TYLENOL Take 650 mg by mouth every 4 (four) hours as needed for mild pain or moderate pain. Take 2 tablets 1 hour prior to Rituxan treatment.   albuterol 108 (90 Base) MCG/ACT inhaler Commonly known as: ProAir HFA Inhale 2 puffs into the lungs at bedtime as needed for wheezing or shortness of  breath.   amLODipine 5 MG tablet Commonly known as: NORVASC Take 1 tablet (5 mg total) by mouth daily.   budesonide-formoterol 160-4.5 MCG/ACT inhaler Commonly known as: SYMBICORT Inhale 2 puffs into the lungs 2 (two) times daily.   cephALEXin 500 MG capsule Commonly known as: KEFLEX Take 1 capsule (500 mg total) by mouth 3 (three) times daily for 6 days.   docusate sodium 100 MG capsule Commonly known as: COLACE Take 200 mg by mouth daily as needed for mild constipation.   fluticasone 50 MCG/ACT nasal spray Commonly known as: FLONASE Place 1 spray into both nostrils as needed for allergies or rhinitis.   gabapentin 300 MG capsule Commonly known as: NEURONTIN Take 1 cap in AM and 2 caps at night (300AM, 600PM)   glipiZIDE 10 MG tablet Commonly known as: GLUCOTROL Take 10 mg by mouth 2 (two) times daily before a meal.   HYDROcodone-acetaminophen 5-325 MG tablet Commonly known as: NORCO/VICODIN Take 1 tablet by mouth every 4 (four) hours as needed for moderate pain.   hydrocortisone 2.5 % cream Apply 1 application topically as needed (itching).   insulin lispro 100 UNIT/ML cartridge Commonly known as: HUMALOG Inject 1-10 Units into the skin as directed. On sliding scale when eating a big meal   ipratropium 0.06 % nasal spray Commonly known as: ATROVENT Place 2 sprays into both nostrils 2 (two) times daily.   meclizine 25 MG tablet Commonly known as: ANTIVERT Take 25 mg by mouth 2 (two) times daily as needed for dizziness.   pantoprazole 40 MG tablet Commonly known as: PROTONIX TAKE 1 TABLET BY MOUTH 2 TIMES DAILY BEFORE A MEAL. What changed: See the new instructions.   propranolol ER 60 MG 24 hr capsule Commonly known as: Inderal LA Take 1 capsule (60 mg total) by mouth at bedtime. For tremor. What changed:  medication strength how much to take   tamsulosin 0.4 MG Caps capsule Commonly known as: FLOMAX Take 1 capsule (0.4 mg total) by mouth daily after  supper.   temazepam 15 MG capsule Commonly known as: RESTORIL Take 15 mg by mouth at bedtime as needed for sleep.   testosterone cypionate 200 MG/ML injection Commonly known as: DEPOTESTOSTERONE CYPIONATE Inject 200 mg into the muscle every 14 (fourteen) days.   Toujeo SoloStar 300 UNIT/ML Solostar Pen Generic drug: insulin glargine (1 Unit Dial) Inject 25 Units into the skin every morning. What changed: how much to take        Allergies  Allergen Reactions   Bee Venom Anaphylaxis   Adhesive [Tape] Hives   Ciprofloxacin     Unknown    Codeine     Unknown     Iodine     Unknown     Latex     Unknown     Povidone-Iodine     Unknown     Simvastatin     Unknown      Consultations: Urology   Procedures/Studies: CT HEAD WO CONTRAST (5MM)  Result Date: 06/04/2021 CLINICAL DATA:  Dizziness and weakness today. Recent COVID-19 infection. Cerebral  hemorrhage suspected. EXAM: CT HEAD WITHOUT CONTRAST TECHNIQUE: Contiguous axial images were obtained from the base of the skull through the vertex without intravenous contrast. COMPARISON:  CT head 05/05/2021 and 02/17/2018. FINDINGS: Brain: There is no evidence of acute intracranial hemorrhage, mass lesion, brain edema or extra-axial fluid collection. Atrophy with prominence of the ventricles and subarachnoid spaces. There is stable mild low-density in the periventricular white matter, most consistent with chronic small vessel ischemic change. There is no CT evidence of acute cortical infarction. Vascular: Mild intracranial atherosclerosis. No hyperdense vessel identified. Skull: Negative for fracture or focal lesion. Sinuses/Orbits: New mucosal thickening and partial opacification of the bilateral ethmoid, right frontal and right maxillary sinuses without air-fluid levels. The mastoid air cells and middle ears are clear. Stable postsurgical changes in both orbits. Other: None. IMPRESSION: 1. Stable appearance of the brain without  acute intracranial findings. 2. New paranasal sinus mucosal thickening and partial opacification consistent with sinusitis. Electronically Signed   By: Richardean Sale M.D.   On: 06/04/2021 14:40   CT CHEST ABDOMEN PELVIS W CONTRAST  Result Date: 05/19/2021 CLINICAL DATA:  Acute abdominal pain, low back pain, shortness of breath, dizziness. History of colon and prostate cancer, status post prostatectomy. History of diabetes. EXAM: CT CHEST, ABDOMEN, AND PELVIS WITH CONTRAST TECHNIQUE: Multidetector CT imaging of the chest, abdomen and pelvis was performed following the standard protocol during bolus administration of intravenous contrast. CONTRAST:  72m OMNIPAQUE IOHEXOL 300 MG/ML  SOLN COMPARISON:  Chest CT angiogram dated 05/05/2021. CT abdomen and pelvis dated 05/05/2021. FINDINGS: CT CHEST FINDINGS Cardiovascular: No thoracic aortic aneurysm or evidence of aortic dissection. Aortic atherosclerosis noted. No pericardial effusion. Mediastinum/Nodes: No mass or enlarged lymph nodes are seen within the mediastinum or perihilar regions. Esophagus is unremarkable. Trachea is unremarkable. Lungs/Pleura: Mild scarring/atelectasis within the lower lobes bilaterally. No consolidation, pleural effusion or pneumothorax. Chronic bronchitic changes noted within the bilateral perihilar regions. Musculoskeletal: No acute findings. Mild degenerative spondylosis within the mid/lower thoracic spine. CT ABDOMEN PELVIS FINDINGS Hepatobiliary: Multiple layering stones within the otherwise normal-appearing gallbladder. No pericholecystic inflammation. No focal liver abnormality is seen. No bile duct dilatation. Pancreas: Unremarkable. No pancreatic ductal dilatation or surrounding inflammatory changes. Spleen: Normal in size without focal abnormality. Adrenals/Urinary Tract: Adrenal glands appear normal. Multiple bilateral renal cysts. Mild bilateral hydronephrosis, but this appears improved compared to the previous study of  05/05/2021. No ureteral or bladder calculi. Mild bladder wall thickening, circumferential, stable to slightly improved compared to the earlier CT of 05/05/2021. Stomach/Bowel: No dilated large or small bowel loops. Fairly extensive diverticulosis of the descending and upper sigmoid colon but no focal inflammatory change to suggest acute diverticulitis. No evidence of bowel wall inflammation. Stomach is unremarkable, partially decompressed. Appendix appears normal. Vascular/Lymphatic: Aortic atherosclerosis. No abdominal aortic aneurysm or evidence of dissection. No acute-appearing vascular abnormality. No enlarged lymph nodes are seen. Reproductive: Penile prosthesis in place, without obvious complicating feature. Associated reservoir within the LEFT lower pelvis. Presumed prostatectomy. Other: No free fluid or abscess collection. No free intraperitoneal air. Musculoskeletal: Degenerative spondylosis of the lumbar spine, at least moderate in degree. No acute or suspicious osseous abnormality. IMPRESSION: 1. No acute findings within the chest, abdomen or pelvis. 2. Chronic bronchitic changes. No evidence of pneumonia. No pleural effusion. 3. Mild bilateral hydronephrosis, but this appears improved compared to the previous study of 05/05/2021. No ureteral or bladder calculi. Mild bladder wall thickening, stable to slightly improved compared to the earlier CT of 05/05/2021. This could represent chronic  cystitis or chronic bladder outlet obstruction. No evidence of pyelonephritis. 4. Colonic diverticulosis without evidence of acute diverticulitis. No bowel obstruction or evidence of bowel wall inflammation. 5. Cholelithiasis without evidence of acute cholecystitis. 6. Degenerative spondylosis of the thoracolumbar spine, at least moderate in degree within the lumbar spine. No acute or suspicious osseous abnormality. 7. No thoracic or abdominal aortic aneurysm. Aortic Atherosclerosis (ICD10-I70.0). Electronically Signed    By: Franki Cabot M.D.   On: 05/19/2021 11:43   DG CHEST PORT 1 VIEW  Result Date: 06/04/2021 CLINICAL DATA:  Acute hypoxic respiratory failure. COVID-19 infection. History of diabetes. EXAM: PORTABLE CHEST 1 VIEW COMPARISON:  Radiographs 06/04/2021 and 05/19/2021.  CT 05/19/2021. FINDINGS: 2007 hours. Lower lung volumes with mild patient rotation to the left. The heart size and mediastinal contours are stable with aortic atherosclerosis. There are lower lung volumes with mildly increased patchy opacities at both lung bases, probably atelectasis. No confluent airspace opacity or typical ground-glass opacities of viral infection. No pleural effusion or pneumothorax. The bones appear unchanged status post lower cervical fusion. IMPRESSION: Lower lung volumes with probable mildly increased bibasilar atelectasis. No consolidation or typical findings of viral pneumonia. Electronically Signed   By: Richardean Sale M.D.   On: 06/04/2021 20:27   DG Chest Port 1 View  Result Date: 06/04/2021 CLINICAL DATA:  Questionable sepsis EXAM: PORTABLE CHEST 1 VIEW COMPARISON:  05/19/2021 FINDINGS: The heart size and mediastinal contours are within normal limits. Mild bibasilar atelectasis is again noted. No pleural effusion or pneumothorax. The visualized skeletal structures are unremarkable. IMPRESSION: Mild bibasilar atelectasis. Electronically Signed   By: Merilyn Baba M.D.   On: 06/04/2021 13:17   DG Chest Portable 1 View  Result Date: 05/19/2021 CLINICAL DATA:  Shortness of breath. EXAM: PORTABLE CHEST 1 VIEW COMPARISON:  May 05, 2021. FINDINGS: The heart size and mediastinal contours are within normal limits. Minimal bibasilar subsegmental atelectasis is noted. The visualized skeletal structures are unremarkable. IMPRESSION: Minimal bibasilar subsegmental atelectasis. Electronically Signed   By: Marijo Conception M.D.   On: 05/19/2021 09:34     Subjective: No acute issues or events overnight denies nausea  vomiting diarrhea constipation any fevers chills or chest pain   Discharge Exam: Vitals:   06/11/21 1942 06/11/21 2042  BP:  139/87  Pulse: 75 78  Resp: 18 20  Temp:  97.9 F (36.6 C)  SpO2: 92% 92%   Vitals:   06/11/21 0731 06/11/21 1135 06/11/21 1942 06/11/21 2042  BP:  130/79  139/87  Pulse:  65 75 78  Resp:  20 18 20   Temp:  98.2 F (36.8 C)  97.9 F (36.6 C)  TempSrc:    Oral  SpO2: 93% 93% 92% 92%  Weight:      Height:       General:  Pleasantly resting in bed, No acute distress. HEENT:  Normocephalic atraumatic.  Sclerae nonicteric, noninjected. Extraocular movements intact bilaterally. Neck: Without mass or deformity. Trachea is midline. Lungs: Clear to auscultate bilaterally without rhonchi, wheeze, or rales. Heart: Regular rate and rhythm. Without murmurs, rubs, or gallops. Abdomen:  Soft, nontender, nondistended. Without guarding or rebound. Foley catheter draining clear yellow urine Extremities: Without cyanosis, clubbing, edema, or obvious deformity. Vascular:  Dorsalis pedis and posterior tibial pulses palpable bilaterally. Skin:  Warm and dry, no erythema, no ulcerations.  The results of significant diagnostics from this hospitalization (including imaging, microbiology, ancillary and laboratory) are listed below for reference.     Microbiology: Recent Results (from  the past 240 hour(s))  Resp Panel by RT-PCR (Flu A&B, Covid) Nasopharyngeal Swab     Status: Abnormal   Collection Time: 06/04/21 12:23 PM   Specimen: Nasopharyngeal Swab; Nasopharyngeal(NP) swabs in vial transport medium  Result Value Ref Range Status   SARS Coronavirus 2 by RT PCR POSITIVE (A) NEGATIVE Final    Comment: CRITICAL RESULT CALLED TO, READ BACK BY AND VERIFIED WITH: CRAWFORD,H AT 1344 ON 9.12.22 BY RUCINSKI, B (NOTE) SARS-CoV-2 target nucleic acids are DETECTED.  The SARS-CoV-2 RNA is generally detectable in upper respiratory specimens during the acute phase of infection.  Positive results are indicative of the presence of the identified virus, but do not rule out bacterial infection or co-infection with other pathogens not detected by the test. Clinical correlation with patient history and other diagnostic information is necessary to determine patient infection status. The expected result is Negative.  Fact Sheet for Patients: EntrepreneurPulse.com.au  Fact Sheet for Healthcare Providers: IncredibleEmployment.be  This test is not yet approved or cleared by the [place] FDA and  has been authorized for detection and/or diagnosis of SARS-CoV-2 by FDA under an Emergency Use Authorization (EUA).  This EUA will remain in effect (mean ing this test can be used) for the duration of  the COVID-19 declaration under Section 564(b)(1) of the Act, 21 U.S.C. section 360bbb-3(b)(1), unless the authorization is terminated or revoked sooner.     Influenza A by PCR NEGATIVE NEGATIVE Final   Influenza B by PCR NEGATIVE NEGATIVE Final    Comment: (NOTE) The Xpert Xpress SARS-CoV-2/FLU/RSV plus assay is intended as an aid in the diagnosis of influenza from Nasopharyngeal swab specimens and should not be used as a sole basis for treatment. Nasal washings and aspirates are unacceptable for Xpert Xpress SARS-CoV-2/FLU/RSV testing.  Fact Sheet for Patients: EntrepreneurPulse.com.au  Fact Sheet for Healthcare Providers: IncredibleEmployment.be  This test is not yet approved or cleared by the [place] FDA and has been authorized for detection and/or diagnosis of SARS-CoV-2 by FDA under an Emergency Use Authorization (EUA). This EUA will remain in effect (meaning this test can be used) for the duration of the COVID-19 declaration under Section 564(b)(1) of the Act, 21 U.S.C. section 360bbb-3(b)(1), unless the authorization is terminated or revoked.  Performed at [facility], [address] [place], [place] [address]   Blood Culture (routine x 2)     Status: Abnormal   Collection Time: 06/04/21 12:39 PM   Specimen: BLOOD LEFT HAND  Result Value Ref Range Status   Specimen Description   Final    BLOOD LEFT HAND BOTTLES DRAWN AEROBIC AND ANAEROBIC Performed at [facility], [address], [place], [place] [address]    Special Requests   Final    Blood Culture adequate volume Performed at [facility], [address], [place], [place] [address]    Culture  Setup Time   Final    ANAEROBIC BOTTLE ONLY GRAM NEGATIVE RODS Gram Stain Report Called to,Read Back By and Verified With: Belton,k@[email or web address]  by Matthews,b 9.13.22 CRITICAL RESULT CALLED TO, READ BACK BY AND VERIFIED WITH: RN CHRISTY DAWSON 06/05/21@3 :57 BY TW  Performed at [facility], [place] [address] [place], [place] [address]    Culture ESCHERICHIA COLI (A)  Final   Report Status 06/07/2021 FINAL  Final   Organism ID, Bacteria ESCHERICHIA COLI  Final      Susceptibility   Escherichia coli - MIC*    AMPICILLIN >=32 RESISTANT Resistant     CEFAZOLIN <=4  SENSITIVE Sensitive     CEFEPIME <=0.12 SENSITIVE Sensitive     CEFTAZIDIME <=1 SENSITIVE Sensitive     CEFTRIAXONE <=0.25 SENSITIVE Sensitive     CIPROFLOXACIN <=0.25 SENSITIVE Sensitive     GENTAMICIN <=1 SENSITIVE Sensitive     IMIPENEM <=0.25 SENSITIVE Sensitive     TRIMETH/SULFA <=20 SENSITIVE Sensitive     AMPICILLIN/SULBACTAM >=32 RESISTANT Resistant     PIP/TAZO <=4 SENSITIVE Sensitive     * ESCHERICHIA COLI  Blood Culture ID Panel (Reflexed)     Status: Abnormal   Collection Time: 06/04/21 12:39 PM  Result Value Ref Range Status   Enterococcus faecalis NOT DETECTED NOT DETECTED Final   Enterococcus Faecium NOT DETECTED NOT DETECTED Final   Listeria monocytogenes NOT DETECTED NOT DETECTED Final   Staphylococcus species NOT DETECTED NOT DETECTED Final   Staphylococcus aureus (BCID) NOT DETECTED NOT DETECTED Final   Staphylococcus epidermidis NOT  DETECTED NOT DETECTED Final   Staphylococcus lugdunensis NOT DETECTED NOT DETECTED Final   Streptococcus species NOT DETECTED NOT DETECTED Final   Streptococcus agalactiae NOT DETECTED NOT DETECTED Final   Streptococcus pneumoniae NOT DETECTED NOT DETECTED Final   Streptococcus pyogenes NOT DETECTED NOT DETECTED Final   A.calcoaceticus-baumannii NOT DETECTED NOT DETECTED Final   Bacteroides fragilis NOT DETECTED NOT DETECTED Final   Enterobacterales DETECTED (A) NOT DETECTED Final    Comment: Enterobacterales represent a large order of gram negative bacteria, not a single organism. CRITICAL RESULT CALLED TO, READ BACK BY AND VERIFIED WITH: RN CHRISTY DAWSON 06/05/21@3 :57 BY TW    Enterobacter cloacae complex NOT DETECTED NOT DETECTED Final   Escherichia coli DETECTED (A) NOT DETECTED Final    Comment: CRITICAL RESULT CALLED TO, READ BACK BY AND VERIFIED WITH: RN CHRISTY DAWSON 06/05/21@3 :57 BY TW    Klebsiella aerogenes NOT DETECTED NOT DETECTED Final   Klebsiella oxytoca NOT DETECTED NOT DETECTED Final   Klebsiella pneumoniae NOT DETECTED NOT DETECTED Final   Proteus species NOT DETECTED NOT DETECTED Final   Salmonella species NOT DETECTED NOT DETECTED Final   Serratia marcescens NOT DETECTED NOT DETECTED Final   Haemophilus influenzae NOT DETECTED NOT DETECTED Final   Neisseria meningitidis NOT DETECTED NOT DETECTED Final   Pseudomonas aeruginosa NOT DETECTED NOT DETECTED Final   Stenotrophomonas maltophilia NOT DETECTED NOT DETECTED Final   Candida albicans NOT DETECTED NOT DETECTED Final   Candida auris NOT DETECTED NOT DETECTED Final   Candida glabrata NOT DETECTED NOT DETECTED Final   Candida krusei NOT DETECTED NOT DETECTED Final   Candida parapsilosis NOT DETECTED NOT DETECTED Final   Candida tropicalis NOT DETECTED NOT DETECTED Final   Cryptococcus neoformans/gattii NOT DETECTED NOT DETECTED Final   CTX-M ESBL NOT DETECTED NOT DETECTED Final   Carbapenem resistance IMP NOT  DETECTED NOT DETECTED Final   Carbapenem resistance KPC NOT DETECTED NOT DETECTED Final   Carbapenem resistance NDM NOT DETECTED NOT DETECTED Final   Carbapenem resist OXA 48 LIKE NOT DETECTED NOT DETECTED Final   Carbapenem resistance VIM NOT DETECTED NOT DETECTED Final    Comment: Performed at [facility], [address], [place], [place] [address]  Blood Culture (routine x 2)     Status: Abnormal   Collection Time: 06/04/21 12:40 PM   Specimen: BLOOD RIGHT FOREARM  Result Value Ref Range Status   Specimen Description   Final    BLOOD RIGHT FOREARM BOTTLES DRAWN AEROBIC AND ANAEROBIC Performed at [facility], [address] [place], [place] [address]  Special Requests   Final    Blood Culture adequate volume Performed at [facility], [address], [place], [place] [address]    Culture  Setup Time   Final    ANAEROBIC BOTTLE ONLY GRAM NEGATIVE RODS Gram Stain Report Called to,Read Back By and Verified With: SPENCE,[id number]  BY MATTHEWS, B 9.13.22 Performed at [facility], [address], [place], [place] [address]    Culture (A)  Final    ESCHERICHIA COLI SUSCEPTIBILITIES PERFORMED ON PREVIOUS CULTURE WITHIN THE LAST 5 DAYS. Performed at [facility], [place] [address] [place], [place] [address]    Report Status 06/07/2021 FINAL  Final  Urine Culture     Status: Abnormal   Collection Time: 06/04/21  2:00 PM   Specimen: In/Out Cath Urine  Result Value Ref Range Status   Specimen Description   Final    IN/OUT CATH URINE Performed at [facility], [address] [place], [place] [address]    Special Requests   Final    NONE Performed at [facility], [address] [place], [place] [address]    Culture >=100,000 COLONIES/mL ESCHERICHIA COLI (A)  Final   Report Status 06/07/2021 FINAL  Final   Organism ID, Bacteria ESCHERICHIA COLI (A)  Final      Susceptibility   Escherichia coli - MIC*    AMPICILLIN >=32 RESISTANT Resistant     CEFAZOLIN <=4 SENSITIVE  Sensitive     CEFEPIME <=0.12 SENSITIVE Sensitive     CEFTRIAXONE <=0.25 SENSITIVE Sensitive     CIPROFLOXACIN <=0.25 SENSITIVE Sensitive     GENTAMICIN <=1 SENSITIVE Sensitive     IMIPENEM <=0.25 SENSITIVE Sensitive     NITROFURANTOIN <=16 SENSITIVE Sensitive     TRIMETH/SULFA <=20 SENSITIVE Sensitive     AMPICILLIN/SULBACTAM >=32 RESISTANT Resistant     PIP/TAZO <=4 SENSITIVE Sensitive     * >=100,000 COLONIES/mL ESCHERICHIA COLI  Culture, blood (routine x 2)     Status: None   Collection Time: 06/05/21 12:43 PM   Specimen: BLOOD LEFT HAND  Result Value Ref Range Status   Specimen Description BLOOD LEFT HAND  Final   Special Requests   Final    BOTTLES DRAWN AEROBIC AND ANAEROBIC Blood Culture adequate volume   Culture   Final    NO GROWTH 5 DAYS Performed at [facility], [address], [place], [place] [address]    Report Status 06/10/2021 FINAL  Final  Culture, blood (routine x 2)     Status: None   Collection Time: 06/05/21 12:43 PM   Specimen: BLOOD RIGHT HAND  Result Value Ref Range Status   Specimen Description BLOOD RIGHT HAND  Final   Special Requests   Final    BOTTLES DRAWN AEROBIC AND ANAEROBIC Blood Culture results may not be optimal due to an excessive volume of blood received in culture bottles   Culture   Final    NO GROWTH 5 DAYS Performed at [facility], [address] [place], [place] [address]    Report Status 06/10/2021 FINAL  Final  MRSA Next Gen by PCR, Nasal     Status: None   Collection Time: 06/05/21  4:07 PM   Specimen: Nasal Mucosa; Nasal Swab  Result Value Ref Range Status   MRSA by PCR Next Gen NOT DETECTED NOT DETECTED Final    Comment: (NOTE) The GeneXpert MRSA Assay (FDA approved for NASAL specimens only), is one component of a comprehensive MRSA colonization surveillance program. It is not intended to  diagnose MRSA infection nor to guide or monitor treatment for MRSA infections. Test performance is not FDA approved in patients less  than 26 years old. Performed at [facility], [address], [place], [place] [address]   Surgical PCR screen     Status: None   Collection Time: 06/09/21  5:03 AM   Specimen: Nasal Mucosa; Nasal Swab  Result Value Ref Range Status   MRSA, PCR NEGATIVE NEGATIVE Final   Staphylococcus aureus NEGATIVE NEGATIVE Final    Comment: (NOTE) The Xpert SA Assay (FDA approved for NASAL specimens in patients 35 years of age and older), is one component of a comprehensive surveillance program. It is not intended to diagnose infection nor to guide or monitor treatment. Performed at [facility], [place] [address] [place],  [address]      Labs: BNP (last 3 results) Recent Labs    06/04/21 2013  BNP 250.0*    Basic Metabolic Panel: Recent Labs  Lab 06/06/21 0444 06/07/21 0438 06/08/21 0838 06/09/21 0455 06/10/21 0508 06/11/21 0400 06/12/21 0501  NA 143 141 140 142 138 137 136  K 3.9 3.5 3.9 3.7 4.2 3.7 3.8  CL 108 110 106 104 100 101 100  CO2 26 25 24 29 30 28 29   GLUCOSE 196* 84 191* 51* 176* 169* 144*  BUN 55* 60* 55* 56* 51* 41* 32*  CREATININE 1.95* 1.73* 1.72* 1.27* 1.54* 1.25* 1.31*  CALCIUM 8.0* 7.6* 8.1* 8.2* 8.0* 7.9* 8.0*  MG 1.8 1.7  --   --   --   --   --     Liver Function Tests: No results for input(s): AST, ALT, ALKPHOS, BILITOT, PROT, ALBUMIN in the last 168 hours.  No results for input(s): LIPASE, AMYLASE in the last 168 hours. No results for input(s): AMMONIA in the last 168 hours. CBC: Recent Labs  Lab 06/07/21 0438 06/08/21 0838 06/09/21 0455 06/10/21 0508 06/11/21 0400 06/12/21 0501  WBC 8.2 6.0 4.6 6.8 6.5 8.0  NEUTROABS 7.3  --  3.4  --   --   --   HGB 13.5 15.3 13.6 14.4 13.1 13.2  HCT 41.8 46.3 40.7 43.7 40.1 39.9  MCV 92.7 92.2 89.5 90.5 90.3 89.5  PLT 91* 122* 114* 142* 128* 143*    Cardiac Enzymes: No results for input(s): CKTOTAL, CKMB, CKMBINDEX, TROPONINI in the last 168 hours. BNP: Invalid input(s):  POCBNP CBG: Recent Labs  Lab 06/11/21 1341 06/11/21 1427 06/11/21 1709 06/11/21 2046 06/12/21 0634  GLUCAP 220* 216* 132* 165* 123*    D-Dimer No results for input(s): DDIMER in the last 72 hours. Hgb A1c No results for input(s): HGBA1C in the last 72 hours. Lipid Profile No results for input(s): CHOL, HDL, LDLCALC, TRIG, CHOLHDL, LDLDIRECT in the last 72 hours. Thyroid function studies No results for input(s): TSH, T4TOTAL, T3FREE, THYROIDAB in the last 72 hours.  Invalid input(s): FREET3 Anemia work up No results for input(s): VITAMINB12, FOLATE, FERRITIN, TIBC, IRON, RETICCTPCT in the last 72 hours. Urinalysis    Component Value Date/Time   COLORURINE YELLOW 06/04/2021 1400   APPEARANCEUR CLOUDY (A) 06/04/2021 1400   LABSPEC 1.011 06/04/2021 1400   PHURINE 5.0 06/04/2021 1400   GLUCOSEU NEGATIVE 06/04/2021 1400   HGBUR MODERATE (A) 06/04/2021 1400   BILIRUBINUR NEGATIVE 06/04/2021 1400   BILIRUBINUR negative 05/04/2021 1702   KETONESUR 5 (A) 06/04/2021 1400   PROTEINUR >=300 (A) 06/04/2021 1400   UROBILINOGEN 1.0 05/04/2021 1702   UROBILINOGEN 0.2 06/30/2015 1552  NITRITE NEGATIVE 06/04/2021 1400   LEUKOCYTESUR LARGE (A) 06/04/2021 1400   Sepsis Labs Invalid input(s): PROCALCITONIN,  WBC,  LACTICIDVEN Microbiology Recent Results (from the past 240 hour(s))  Resp Panel by RT-PCR (Flu A&B, Covid) Nasopharyngeal Swab     Status: Abnormal   Collection Time: 06/04/21 12:23 PM   Specimen: Nasopharyngeal Swab; Nasopharyngeal(NP) swabs in vial transport medium  Result Value Ref Range Status   SARS Coronavirus 2 by RT PCR POSITIVE (A) NEGATIVE Final    Comment: CRITICAL RESULT CALLED TO, READ BACK BY AND VERIFIED WITH: CRAWFORD,H AT 1344 ON 9.12.22 BY RUCINSKI, B (NOTE) SARS-CoV-2 target nucleic acids are DETECTED.  The SARS-CoV-2 RNA is generally detectable in upper respiratory specimens during the acute phase of infection. Positive results are indicative of the  presence of the identified virus, but do not rule out bacterial infection or co-infection with other pathogens not detected by the test. Clinical correlation with patient history and other diagnostic information is necessary to determine patient infection status. The expected result is Negative.  Fact Sheet for Patients: EntrepreneurPulse.com.au  Fact Sheet for Healthcare Providers: IncredibleEmployment.be  This test is not yet approved or cleared by the [place] FDA and  has been authorized for detection and/or diagnosis of SARS-CoV-2 by FDA under an Emergency Use Authorization (EUA).  This EUA will remain in effect (mean ing this test can be used) for the duration of  the COVID-19 declaration under Section 564(b)(1) of the Act, 21 U.S.C. section 360bbb-3(b)(1), unless the authorization is terminated or revoked sooner.     Influenza A by PCR NEGATIVE NEGATIVE Final   Influenza B by PCR NEGATIVE NEGATIVE Final    Comment: (NOTE) The Xpert Xpress SARS-CoV-2/FLU/RSV plus assay is intended as an aid in the diagnosis of influenza from Nasopharyngeal swab specimens and should not be used as a sole basis for treatment. Nasal washings and aspirates are unacceptable for Xpert Xpress SARS-CoV-2/FLU/RSV testing.  Fact Sheet for Patients: EntrepreneurPulse.com.au  Fact Sheet for Healthcare Providers: IncredibleEmployment.be  This test is not yet approved or cleared by the [place] FDA and has been authorized for detection and/or diagnosis of SARS-CoV-2 by FDA under an Emergency Use Authorization (EUA). This EUA will remain in effect (meaning this test can be used) for the duration of the COVID-19 declaration under Section 564(b)(1) of the Act, 21 U.S.C. section 360bbb-3(b)(1), unless the authorization is terminated or revoked.  Performed at [facility], [address], [place], [place] [address]   Blood  Culture (routine x 2)     Status: Abnormal   Collection Time: 06/04/21 12:39 PM   Specimen: BLOOD LEFT HAND  Result Value Ref Range Status   Specimen Description   Final    BLOOD LEFT HAND BOTTLES DRAWN AEROBIC AND ANAEROBIC Performed at [facility], [address], [place], [place] [address]    Special Requests   Final    Blood Culture adequate volume Performed at [facility], [address], [place], [place] [address]    Culture  Setup Time   Final    ANAEROBIC BOTTLE ONLY GRAM NEGATIVE RODS Gram Stain Report Called to,Read Back By and Verified With: Belton,k@[email or web address]  by Matthews,b 9.13.22 CRITICAL RESULT CALLED TO, READ BACK BY AND VERIFIED WITH: RN CHRISTY DAWSON 06/05/21@3 :57 BY TW  Performed at [facility], [place] [address] [place], [place] [address]    Culture ESCHERICHIA COLI (A)  Final   Report Status 06/07/2021 FINAL  Final   Organism ID, Bacteria ESCHERICHIA COLI  Final      Susceptibility   Escherichia coli - MIC*    AMPICILLIN >=32 RESISTANT Resistant     CEFAZOLIN <=4 SENSITIVE Sensitive     CEFEPIME <=0.12 SENSITIVE Sensitive     CEFTAZIDIME <=1 SENSITIVE Sensitive     CEFTRIAXONE <=0.25 SENSITIVE Sensitive     CIPROFLOXACIN <=0.25 SENSITIVE Sensitive     GENTAMICIN <=1 SENSITIVE Sensitive     IMIPENEM <=0.25 SENSITIVE Sensitive     TRIMETH/SULFA <=20 SENSITIVE Sensitive     AMPICILLIN/SULBACTAM >=32 RESISTANT Resistant     PIP/TAZO <=4 SENSITIVE Sensitive     * ESCHERICHIA COLI  Blood Culture ID Panel (Reflexed)     Status: Abnormal   Collection Time: 06/04/21 12:39 PM  Result Value Ref Range Status   Enterococcus faecalis NOT DETECTED NOT DETECTED Final   Enterococcus Faecium NOT DETECTED NOT DETECTED Final   Listeria monocytogenes NOT DETECTED NOT DETECTED Final   Staphylococcus species NOT DETECTED NOT DETECTED Final   Staphylococcus aureus (BCID) NOT DETECTED NOT DETECTED Final   Staphylococcus epidermidis NOT DETECTED NOT DETECTED Final    Staphylococcus lugdunensis NOT DETECTED NOT DETECTED Final   Streptococcus species NOT DETECTED NOT DETECTED Final   Streptococcus agalactiae NOT DETECTED NOT DETECTED Final   Streptococcus pneumoniae NOT DETECTED NOT DETECTED Final   Streptococcus pyogenes NOT DETECTED NOT DETECTED Final   A.calcoaceticus-baumannii NOT DETECTED NOT DETECTED Final   Bacteroides fragilis NOT DETECTED NOT DETECTED Final   Enterobacterales DETECTED (A) NOT DETECTED Final    Comment: Enterobacterales represent a large order of gram negative bacteria, not a single organism. CRITICAL RESULT CALLED TO, READ BACK BY AND VERIFIED WITH: RN CHRISTY DAWSON 06/05/21@3 :57 BY TW    Enterobacter cloacae complex NOT DETECTED NOT DETECTED Final   Escherichia coli DETECTED (A) NOT DETECTED Final    Comment: CRITICAL RESULT CALLED TO, READ BACK BY AND VERIFIED WITH: RN CHRISTY DAWSON 06/05/21@3 :57 BY TW    Klebsiella aerogenes NOT DETECTED NOT DETECTED Final   Klebsiella oxytoca NOT DETECTED NOT DETECTED Final   Klebsiella pneumoniae NOT DETECTED NOT DETECTED Final   Proteus species NOT DETECTED NOT DETECTED Final   Salmonella species NOT DETECTED NOT DETECTED Final   Serratia marcescens NOT DETECTED NOT DETECTED Final   Haemophilus influenzae NOT DETECTED NOT DETECTED Final   Neisseria meningitidis NOT DETECTED NOT DETECTED Final   Pseudomonas aeruginosa NOT DETECTED NOT DETECTED Final   Stenotrophomonas maltophilia NOT DETECTED NOT DETECTED Final   Candida albicans NOT DETECTED NOT DETECTED Final   Candida auris NOT DETECTED NOT DETECTED Final   Candida glabrata NOT DETECTED NOT DETECTED Final   Candida krusei NOT DETECTED NOT DETECTED Final   Candida parapsilosis NOT DETECTED NOT DETECTED Final   Candida tropicalis NOT DETECTED NOT DETECTED Final   Cryptococcus neoformans/gattii NOT DETECTED NOT DETECTED Final   CTX-M ESBL NOT DETECTED NOT DETECTED Final   Carbapenem resistance IMP NOT DETECTED NOT DETECTED Final    Carbapenem resistance KPC NOT DETECTED NOT DETECTED Final   Carbapenem resistance NDM NOT DETECTED NOT DETECTED Final   Carbapenem resist OXA 48 LIKE NOT DETECTED NOT DETECTED Final   Carbapenem resistance VIM NOT DETECTED NOT DETECTED Final    Comment: Performed at [facility], [address] [place], [place] [address]  Blood Culture (routine x 2)     Status: Abnormal   Collection Time: 06/04/21 12:40 PM   Specimen: BLOOD RIGHT FOREARM  Result Value Ref Range Status   Specimen Description  Final    BLOOD RIGHT FOREARM BOTTLES DRAWN AEROBIC AND ANAEROBIC Performed at [facility], [address], [place], [place] [address]    Special Requests   Final    Blood Culture adequate volume Performed at [facility], [address], [place], [place] [address]    Culture  Setup Time   Final    ANAEROBIC BOTTLE ONLY GRAM NEGATIVE RODS Gram Stain Report Called to,Read Back By and Verified With: SPENCE,[id number]  BY MATTHEWS, B 9.13.22 Performed at [facility], [address] [place], [place] [address]    Culture (A)  Final    ESCHERICHIA COLI SUSCEPTIBILITIES PERFORMED ON PREVIOUS CULTURE WITHIN THE LAST 5 DAYS. Performed at [facility], [place] [address] [place], [place] [address]    Report Status 06/07/2021 FINAL  Final  Urine Culture     Status: Abnormal   Collection Time: 06/04/21  2:00 PM   Specimen: In/Out Cath Urine  Result Value Ref Range Status   Specimen Description   Final    IN/OUT CATH URINE Performed at [facility], [address] [place], [place] [address]    Special Requests   Final    NONE Performed at [facility], [address] [place], [place] [address]    Culture >=100,000 COLONIES/mL ESCHERICHIA COLI (A)  Final   Report Status 06/07/2021 FINAL  Final   Organism ID, Bacteria ESCHERICHIA COLI (A)  Final      Susceptibility   Escherichia coli - MIC*    AMPICILLIN >=32 RESISTANT Resistant     CEFAZOLIN <=4 SENSITIVE Sensitive     CEFEPIME <=0.12  SENSITIVE Sensitive     CEFTRIAXONE <=0.25 SENSITIVE Sensitive     CIPROFLOXACIN <=0.25 SENSITIVE Sensitive     GENTAMICIN <=1 SENSITIVE Sensitive     IMIPENEM <=0.25 SENSITIVE Sensitive     NITROFURANTOIN <=16 SENSITIVE Sensitive     TRIMETH/SULFA <=20 SENSITIVE Sensitive     AMPICILLIN/SULBACTAM >=32 RESISTANT Resistant     PIP/TAZO <=4 SENSITIVE Sensitive     * >=100,000 COLONIES/mL ESCHERICHIA COLI  Culture, blood (routine x 2)     Status: None   Collection Time: 06/05/21 12:43 PM   Specimen: BLOOD LEFT HAND  Result Value Ref Range Status   Specimen Description BLOOD LEFT HAND  Final   Special Requests   Final    BOTTLES DRAWN AEROBIC AND ANAEROBIC Blood Culture adequate volume   Culture   Final    NO GROWTH 5 DAYS Performed at [facility], [address], [place], [place] [address]    Report Status 06/10/2021 FINAL  Final  Culture, blood (routine x 2)     Status: None   Collection Time: 06/05/21 12:43 PM   Specimen: BLOOD RIGHT HAND  Result Value Ref Range Status   Specimen Description BLOOD RIGHT HAND  Final   Special Requests   Final    BOTTLES DRAWN AEROBIC AND ANAEROBIC Blood Culture results may not be optimal due to an excessive volume of blood received in culture bottles   Culture   Final    NO GROWTH 5 DAYS Performed at [facility], [address], [place], [place] [address]    Report Status 06/10/2021 FINAL  Final  MRSA Next Gen by PCR, Nasal     Status: None   Collection Time: 06/05/21  4:07 PM   Specimen: Nasal Mucosa; Nasal Swab  Result Value Ref Range Status   MRSA by PCR Next Gen NOT DETECTED NOT DETECTED Final    Comment: (  NOTE) The GeneXpert MRSA Assay (FDA approved for NASAL specimens only), is one component of a comprehensive MRSA colonization surveillance program. It is not intended to diagnose MRSA infection nor to guide or monitor treatment for MRSA infections. Test performance is not FDA approved in patients less than 20 years old. Performed at  [facility], [address], [place], [place] [address]   Surgical PCR screen     Status: None   Collection Time: 06/09/21  5:03 AM   Specimen: Nasal Mucosa; Nasal Swab  Result Value Ref Range Status   MRSA, PCR NEGATIVE NEGATIVE Final   Staphylococcus aureus NEGATIVE NEGATIVE Final    Comment: (NOTE) The Xpert SA Assay (FDA approved for NASAL specimens in patients 50 years of age and older), is one component of a comprehensive surveillance program. It is not intended to diagnose infection nor to guide or monitor treatment. Performed at [facility], [place] [address], [place], [place] [address]    Time coordinating discharge: Over 30 minutes  SIGNED:  Little Ishikawa, DO [facility] 06/12/2021, 7:13 AM Pager   If 7PM-7AM, please contact night-coverage [email or web address]

## 2021-06-14 ENCOUNTER — Other Ambulatory Visit (INDEPENDENT_AMBULATORY_CARE_PROVIDER_SITE_OTHER): Payer: Self-pay | Admitting: *Deleted

## 2021-06-14 DIAGNOSIS — K219 Gastro-esophageal reflux disease without esophagitis: Secondary | ICD-10-CM

## 2021-06-14 MED ORDER — PANTOPRAZOLE SODIUM 40 MG PO TBEC
40.0000 mg | DELAYED_RELEASE_TABLET | Freq: Two times a day (BID) | ORAL | 0 refills | Status: DC
Start: 2021-06-14 — End: 2021-07-31

## 2021-06-29 ENCOUNTER — Ambulatory Visit (HOSPITAL_COMMUNITY)
Admission: RE | Admit: 2021-06-29 | Discharge: 2021-06-29 | Disposition: A | Discharge status: Home / Self Care | Payer: Medicare Other | Source: Ambulatory Visit | Attending: Urology | Admitting: Urology

## 2021-06-29 ENCOUNTER — Other Ambulatory Visit: Payer: Self-pay | Admitting: Urology

## 2021-06-29 ENCOUNTER — Other Ambulatory Visit: Payer: Self-pay

## 2021-06-29 ENCOUNTER — Other Ambulatory Visit (HOSPITAL_COMMUNITY): Payer: Self-pay | Admitting: Urology

## 2021-06-29 DIAGNOSIS — N368 Other specified disorders of urethra: Secondary | ICD-10-CM | POA: Diagnosis not present

## 2021-06-29 MED ORDER — IOTHALAMATE MEGLUMINE 17.2 % UR SOLN
250.0000 mL | Freq: Once | URETHRAL | Status: AC | PRN
  Administered 2021-06-29: 13:00:00 120 mL via INTRAVESICAL

## 2021-07-06 DIAGNOSIS — N368 Other specified disorders of urethra: Secondary | ICD-10-CM | POA: Diagnosis not present

## 2021-07-16 DIAGNOSIS — E119 Type 2 diabetes mellitus without complications: Secondary | ICD-10-CM | POA: Diagnosis not present

## 2021-07-20 DIAGNOSIS — E1122 Type 2 diabetes mellitus with diabetic chronic kidney disease: Secondary | ICD-10-CM | POA: Diagnosis not present

## 2021-07-24 DIAGNOSIS — D6949 Other primary thrombocytopenia: Secondary | ICD-10-CM | POA: Diagnosis not present

## 2021-07-24 DIAGNOSIS — K219 Gastro-esophageal reflux disease without esophagitis: Secondary | ICD-10-CM | POA: Diagnosis not present

## 2021-07-24 DIAGNOSIS — N390 Urinary tract infection, site not specified: Secondary | ICD-10-CM | POA: Diagnosis not present

## 2021-07-24 DIAGNOSIS — N1832 Chronic kidney disease, stage 3b: Secondary | ICD-10-CM | POA: Diagnosis not present

## 2021-07-24 DIAGNOSIS — M6281 Muscle weakness (generalized): Secondary | ICD-10-CM | POA: Diagnosis not present

## 2021-07-24 DIAGNOSIS — R0789 Other chest pain: Secondary | ICD-10-CM | POA: Diagnosis not present

## 2021-07-24 DIAGNOSIS — G250 Essential tremor: Secondary | ICD-10-CM | POA: Diagnosis not present

## 2021-07-24 DIAGNOSIS — J9601 Acute respiratory failure with hypoxia: Secondary | ICD-10-CM | POA: Diagnosis not present

## 2021-07-24 DIAGNOSIS — L299 Pruritus, unspecified: Secondary | ICD-10-CM | POA: Diagnosis not present

## 2021-07-24 DIAGNOSIS — J45901 Unspecified asthma with (acute) exacerbation: Secondary | ICD-10-CM | POA: Diagnosis not present

## 2021-07-24 DIAGNOSIS — E782 Mixed hyperlipidemia: Secondary | ICD-10-CM | POA: Diagnosis not present

## 2021-07-24 DIAGNOSIS — E1122 Type 2 diabetes mellitus with diabetic chronic kidney disease: Secondary | ICD-10-CM | POA: Diagnosis not present

## 2021-07-25 DIAGNOSIS — Z23 Encounter for immunization: Secondary | ICD-10-CM | POA: Diagnosis not present

## 2021-07-31 ENCOUNTER — Other Ambulatory Visit: Payer: Self-pay

## 2021-07-31 ENCOUNTER — Encounter (INDEPENDENT_AMBULATORY_CARE_PROVIDER_SITE_OTHER): Payer: Self-pay | Admitting: Internal Medicine

## 2021-07-31 ENCOUNTER — Ambulatory Visit (INDEPENDENT_AMBULATORY_CARE_PROVIDER_SITE_OTHER): Payer: Medicare Other | Admitting: Internal Medicine

## 2021-07-31 VITALS — BP 158/79 | HR 77 | Temp 98.0°F | Ht 71.0 in | Wt 216.7 lb

## 2021-07-31 DIAGNOSIS — K219 Gastro-esophageal reflux disease without esophagitis: Secondary | ICD-10-CM | POA: Diagnosis not present

## 2021-07-31 DIAGNOSIS — Z8719 Personal history of other diseases of the digestive system: Secondary | ICD-10-CM

## 2021-07-31 NOTE — Progress Notes (Signed)
Presenting complaint;  Follow for chronic GERD.  History of ulcerative colitis and mantle cell lymphoma of the colon.  Database and subjective:  Patient is 86 year old Caucasian male with multiple medical problems who is here for scheduled visit.  Patient was last seen in the office in October 2021.  He states he was hospitalized twice which is summarized below.  First admission was in August and the second admission was in September. He says PPI is working.  He is having to take it twice a day.  His appetite is good.  He has lost 12 pounds since his last visit.  He states most of the weight loss occurred when he was admitted with sepsis about 2 months ago.  He denies dysphagia nausea or vomiting.  His bowels move daily.  He states his stools are hard.  He is taking stool softener or as-needed basis.  He is not having rectal bleeding but he has noted tarry stools over the last 4 days but not yesterday.  He did take Pepto-Bismol last week. He is now incontinent of urine.  He has followed up with his urologist at [facility] where sphincter prosthesis was placed to begin with.  He is hoping to have it redone because he feels his quality of life is not good as he is having to wear diapers and he changes them at least 4 times a day if not more. He is not having any side effects with PPI. He says his A1c was 8.43 weeks ago and has been as low as 7.  He feels  Because of recent illness and sickness.   Summary of recent hospitalizations.  Patient was admitted to [facility] between 05/05/2021 and 05/06/2021 for bronchospasm and imbalance and treated with bronchodilators and steroids and improved.  Patient was admitted again on 06/04/2021 with what appears to be metabolic encephalopathy due to urosepsis.  He was unable to void.  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  Both these processes were removed.  Current  Medications: Outpatient Encounter Medications as of 07/31/2021  Medication Sig   Accu-Chek FastClix Lancets MISC Apply topically.   ACCU-CHEK GUIDE test strip    acetaminophen (TYLENOL) 325 MG tablet Take 650 mg by mouth every 4 (four) hours as needed for mild pain or moderate pain. Take 2 tablets 1 hour prior to Rituxan treatment.   albuterol (PROAIR HFA) 108 (90 Base) MCG/ACT inhaler Inhale 2 puffs into the lungs at bedtime as needed for wheezing or shortness of breath.   amLODipine (NORVASC) 5 MG tablet Take 1 tablet (5 mg total) by mouth daily.   budesonide-formoterol (SYMBICORT) 160-4.5 MCG/ACT inhaler Inhale 2 puffs into the lungs 2 (two) times daily.   docusate sodium (COLACE) 100 MG capsule Take 200 mg by mouth daily as needed for mild constipation.   fluticasone (FLONASE) 50 MCG/ACT nasal spray Place 1 spray into both nostrils as needed for allergies or rhinitis.   gabapentin (NEURONTIN) 300 MG capsule Take 1 cap in AM and 2 caps at night (300AM, 600PM)   glipiZIDE (GLUCOTROL) 10 MG tablet Take 10 mg by mouth 2 (two) times daily before a meal.    HYDROcodone-acetaminophen (NORCO/VICODIN) 5-325 MG tablet Take 1 tablet by mouth every 4 (four) hours as needed for moderate pain.   hydrocortisone 2.5 % cream Apply 1 application topically as needed (itching).   insulin lispro (HUMALOG) 100 UNIT/ML cartridge Inject 1-10 Units into the skin as  directed. On sliding scale when eating a big meal   ipratropium (ATROVENT) 0.06 % nasal spray Place 2 sprays into both nostrils 2 (two) times daily.    meclizine (ANTIVERT) 25 MG tablet Take 25 mg by mouth 2 (two) times daily as needed for dizziness.    pantoprazole (PROTONIX) 40 MG tablet TAKE 1 TABLET BY MOUTH 2 TIMES DAILY BEFORE A MEAL. (Patient taking differently: Take 40 mg by mouth 2 (two) times daily.)   propranolol ER (INDERAL LA) 60 MG 24 hr capsule Take 1 capsule (60 mg total) by mouth at bedtime. For tremor.   temazepam (RESTORIL) 15 MG capsule  Take 15 mg by mouth at bedtime as needed for sleep.   testosterone cypionate (DEPOTESTOSTERONE CYPIONATE) 200 MG/ML injection Inject 200 mg into the muscle every 14 (fourteen) days.    TOUJEO SOLOSTAR 300 UNIT/ML Solostar Pen Inject 25 Units into the skin every morning.   [DISCONTINUED] insulin aspart (NOVOLOG) 100 UNIT/ML injection Inject 20 Units into the skin 3 (three) times daily before meals.     [DISCONTINUED] pantoprazole (PROTONIX) 40 MG tablet Take 1 tablet (40 mg total) by mouth 2 (two) times daily.   [DISCONTINUED] rosuvastatin (CRESTOR) 40 MG tablet Take 40 mg by mouth daily.     Facility-Administered Encounter Medications as of 07/31/2021  Medication   heparin lock flush 100 unit/mL   sodium chloride flush (NS) 0.9 % injection 20 mL     Objective: Blood pressure (!) 158/79, pulse 77, temperature 98 F (36.7 C), temperature source Oral, height _0  (1.803 m), weight 216 lb 11.2 oz (98.3 kg). Patient is alert in no acute distress. Conjunctiva is pink. Sclera is nonicteric Oropharyngeal mucosa is normal. No neck masses or thyromegaly noted. Cardiac exam with regular rhythm normal S1 and S2. No murmur or gallop noted. Lungs are clear to auscultation. Abdomen is symmetrical and soft.  On palpation he has mild tenderness over the right rib cage.  No organomegaly or masses. Rectal examination reveals brown stool and it is guaiac negative. No LE edema or clubbing noted.  Labs/studies Results:   CBC Latest Ref Rng & Units 06/12/2021 06/11/2021 06/10/2021  WBC 4.0 - 10.5 K/uL 8.0 6.5 6.8  Hemoglobin 13.0 - 17.0 g/dL 13.2 13.1 14.4  Hematocrit 39.0 - 52.0 % 39.9 40.1 43.7  Platelets 150 - 400 K/uL 143(L) 128(L) 142(L)    CMP Latest Ref Rng & Units 06/12/2021 06/11/2021 06/10/2021  Glucose 70 - 99 mg/dL 144(H) 169(H) 176(H)  BUN 8 - 23 mg/dL 32(H) 41(H) 51(H)  Creatinine 0.61 - 1.24 mg/dL 1.31(H) 1.25(H) 1.54(H)  Sodium 135 - 145 mmol/L 136 137 138  Potassium 3.5 - 5.1 mmol/L 3.8  3.7 4.2  Chloride 98 - 111 mmol/L 100 101 100  CO2 22 - 32 mmol/L _1 Calcium 8.9 - 10.3 mg/dL 8.0(L) 7.9(L) 8.0(L)  Total Protein 6.5 - 8.1 g/dL - - -  Total Bilirubin 0.3 - 1.2 mg/dL - - -  Alkaline Phos 38 - 126 U/L - - -  AST 15 - 41 U/L - - -  ALT 0 - 44 U/L - - -    Hepatic Function Latest Ref Rng & Units 06/05/2021 06/04/2021 05/19/2021  Total Protein 6.5 - 8.1 g/dL 5.3(L) 6.8 6.4(L)  Albumin 3.5 - 5.0 g/dL 2.8(L) 3.9 3.7  AST 15 - 41 U/L 23 21 35  ALT 0 - 44 U/L _2 Alk Phosphatase 38 - 126 U/L 43 55 60  Total  Bilirubin 0.3 - 1.2 mg/dL 1.1 2.3(H) 0.9  Bilirubin, Direct 0.0 - 0.3 mg/dL - - -    Recent lab data reviewed.  Notable for low albumin and elevated BUN and creatinine.  Assessment:  #1.  Chronic GERD.  He is requiring double dose PPI for symptom control.  Single dose did not work.  Therefore he will continue current therapy.  #2.  Patient is high risk for colorectal carcinoma.  He has a history of distal ulcerative colitis which has remained in remission off therapy.  He also has a history of mantle cell lymphoma of the colon diagnosed in December 2014.  He was treated with Rituxan and follow-up exam in June 2015 and in November 2016 revealed him to be in remission.   Plan:  Patient advised to take Colace 20 mg by mouth daily at bedtime. Metamucil 3 to 4 g by mouth daily at bedtime if Colace is not enough. Continue pantoprazole at a dose of 40 mg p.o. twice daily. Will schedule surveillance colonoscopy in March 2023 with propofol. Office visit in 1 year.

## 2021-07-31 NOTE — Patient Instructions (Signed)
Office will call you in February 2023 to schedule colonoscopy in March 2023.. Take Colace/stool softener every day. Can add fiber supplement such as Metamucil 3 to 4 g by mouth daily if needed.

## 2021-08-20 ENCOUNTER — Encounter: Payer: Self-pay | Admitting: Adult Health

## 2021-08-20 ENCOUNTER — Ambulatory Visit: Payer: Medicare Other | Admitting: Adult Health

## 2021-08-20 VITALS — BP 133/76 | HR 89 | Ht 71.0 in | Wt 224.0 lb

## 2021-08-20 DIAGNOSIS — G250 Essential tremor: Secondary | ICD-10-CM | POA: Diagnosis not present

## 2021-08-20 DIAGNOSIS — R269 Unspecified abnormalities of gait and mobility: Secondary | ICD-10-CM | POA: Diagnosis not present

## 2021-08-20 DIAGNOSIS — G3184 Mild cognitive impairment, so stated: Secondary | ICD-10-CM

## 2021-08-20 DIAGNOSIS — G629 Polyneuropathy, unspecified: Secondary | ICD-10-CM | POA: Diagnosis not present

## 2021-08-20 MED ORDER — PROPRANOLOL HCL ER BEADS 80 MG PO CP24: 80.0000 mg | ORAL_CAPSULE | Freq: Every day | ORAL | 5 refills | Status: DC

## 2021-08-20 NOTE — Patient Instructions (Addendum)
Increase propranolol to 56m nightly - try this for 1 month. If tolerating well but no benefit of tremors, plesae let uKoreaknow and we can consider increasing further. Please ensure you routinely monitor your blood pressure and heart rate as this medication can lower both of these  You can try use of a weighted pen to help with your tremors - you can find this online such as aBryson Corona- below is a picture [facility]   If you would like to pursue physical therapy, I am more than happy to assist with this or you can further discuss with Dr. HNevada Crane Continue gabapentin 3066mAM and 60025mM for neuropathy    Followup with Dr. AheJaynee Eagles 3-4 months or call earlier if needed       Thank you for coming to see us [facility]. I hope we have been able to provide you high quality care today.  You may receive a patient satisfaction survey over the next few weeks. We would appreciate your feedback and comments so that we may continue to improve ourselves and the health of our patients.

## 2021-08-20 NOTE — Progress Notes (Signed)
[facility]   Provider:  Dr Jaynee Eagles Referring Provider: Celene Squibb, MD Primary Care Physician:  Wende Neighbors, MD   CC:   Chief Complaint  Patient presents with   Follow-up    RM 2 alone Pt is well, states tremors have worsen since last visit. Neuropathy is up and down, some days are worse than others.       HPI:  Update 08/20/2021 JM: Returns for 77-monthfollow-up  Tremors: C/o worsening at prior visit - increased propranolol ER to 1257mdaily - reports improvement of tremors after increase and tolerating dosage without difficulty. He has had multiple hospitalizations in August and most recent in September (see below) and ultimately propranolol dosage decreased back to 6055maily (unable to verify exact reason). He reports tremors have worsened since that time. Greater difficulty with writing and fine motor skills  Neuropathy:  Reports good days/bad days - denies worsening since prior visit Currently on gabapentin 300m40m and 600mg77m(increased after prior visit) Prior c/o worsening at prior visit - B12, B1, B6 and TSH all within normal limits  MCI: Reports stable since prior visit - was impaired during multiple hospitalizations but he reports since returned back to baseline    Hospitalizations/ER/UC -05/04/2022 UC dysuria with likely UTI tx'd with abx -8/13-8/14/2022 admission for imbalance, cough, SOB, wheezing and dysuria. Imbalance started after starting abx the day prior for UTI - abx changed. COVID neg. Tx'd for acute asthma exacerbation and ecoli UTI placed on Omnicef. Imbalance felt to be due to UTI - CTH negative. No other symptoms to suggest or concern of stroke. Ambulated in hallway w/o difficulty.  -05/19/2021 ER eval for back pain, SOB and fatigue and continued imbalance. Found to be COVID + treated with Paxlovid. Chest x-ray negative. UA negative. Labs unremarkable. D/c'd home as all above subacute symptoms and present since prior  hospitalization -9/12 - 06/12/2021 admission for severe sepsis secondary to E. coli bacteremia related to UTI with noted AKI, acute metabolic encephalopathy likely in setting of sepsis, and acute urinary retention with penile edema s/p cystoscopy with removal of artificial urinary sphincter and inflatable penile prosthesis.       History provided for reference purposes only Update 02/15/2021 JM: [name] for yearly follow-up unaccompanied   Tremors: Remains on propranolol ER 60mg 61my tolerating without side effects.  He does report tremors have slightly worsened and can interfere with his writing.  Continued R>L UE - denies tremors elsewhere.   Neuropathy: On gabapentin tolerating without side effects.  Slight increase in left foot paresthesias -reports he recently seen his podiatrist who advised him to discuss possible dosage increase of gabapentin -at prior visit, we discussed increasing dose from 300 mg daily to 300 mg twice daily but per pt, he just started taking 300mg A30md increased PM dose from 300mg to25mmg las50mek and has noticed some benefit.  He does have history of B12 deficiency not currently on supplementation -he is unsure if vitamin B levels have been checked recently  MCI: Stable   Update 02/10/2020 JM: [name] yearly follow-up regarding tremors, cognitive impairment and peripheral neuropathy management and medication refills.  On propranolol ER 60 mg daily with benefit in regards to tremors.  Tolerating well without hypotension, bradycardia or any other side effects.  Cognition has been stable without worsening.  Declines memory testing.  Neuropathy has improved some on gabapentin 300 mg daily.  Tolerating dosage well.  He is wondering if there  is something better for him to try. Blood pressure today 126/82.  No concerns at this time.  Interval History via virtual visit 01/14/2019 Dr. Jaynee Eagles: He takes 14m propranolol once a day and he is happy but gets  tremulous later in the day.We discussed it is a 2-3x a day medication but he doesn;t want to take medication multiple times a day. We can change him to ER 670mand see if that helps for his tremor. His memory is stable.  He gets shakes later. Advised he could take 4029mwice daily he prefers 43m2m. Stop IR. His feet hurt, he is not taking the gabapentin. Explained this helps with tremor as well. Discussed side effects.  Interval history 11/17/2017 Dr. AherJaynee Eaglestient is here for follow up of tremor on propranolol as well as memory changes. He did not tolerate Aricept. He does well on propranolol.  Also neuropathy is stable. He has been sick, congestion and cough and in his lungs. He is on prednisone, he is feeling a little better. Propranolol is helping. Memory is stable. He has to take 40mg47me days because he shales so much, no side effects if he take 40mg 29mtimes a day, we can increase his dosage to 40mg a76me understands he can cut it in half if needed. His neuropathy is stable. His memory is stable. His tremor is a little worse, will increase propranolol and reviewed side effects and when he should stop. Memory is stable. Taking B12.   Interval history 05/19/2017 Dr. Ahern: Jaynee Eaglesanolol is still helping. He feels his memory is stable.  He takes his own medications and doesn;t miss any, he pays his bills every month and not missing pills or double paying, no difficulties driving, no accidents in the home. Not getting lost. He knows the date every day, he reads the newspaper every day, he walks, he socializes with his family sees family every day. He is still taking B12 supplement. He worries about things, he worries about mowing the yard because he runs out of time. He works on Wednesday and Greensb[place]onpathy is stable in his feet, has burning which is chronic and stable. Recommend daily ASA for stroke prevention. He hydrates well. He sometimes feels lightheaded for the last 2-3 months,  takes propranolol 3x a day recommend maybe decreasing to twice a day and if symptoms of lightheadedness/dizzy get worse call us. He [place] had hypoglycemia and was seen in the ED and this could be the problem as well.    Interval history for tremor 11/04/2016 Dr. Ahern: Jaynee Eaglesnt is here for follow up on tremor on propranolol. He is fine taking the propranolol 3x a day. He can take it up to 4x a day as needed. Memory is stable. Didn't tolerate the Aricept. Discussed the tremor and decided to increase as tolerated and needed. Discussed other medications other than aricept but he declines we will just    Interval history 05/06/2016:Brandon C Averitt iLalani10 y.o.50ale here as a referral from Dr. Hall foNevada Craneemor. PMHx CKD, Type 2 DM, HLD, HTN, insomnia, OSA, depression, polyneuropathy, cerebral ischemia, b12 deficiency, mantle cell lymphoma, anxiety, parkinson's diseas (?). He goes by the name Brandon StOtto Herbside effects with primidone and neurontin. He has been taking 20mg pr72mnolol three times a day. It has helped with the tremor and blood pressure has not been low and no problems taking the medication 3x a day.    MRI of the brain was unremarkable  for age: IMPRESSION: Generalized cortical atrophy and chronic microvascular ischemicchanges, typical for age. No acute abnormality.   Interval history 01/22/2016: primidone and neurontin had side effects. Discussed propranolol and that this is heart medication, we need to be very mindful of his pulse and blood pressure and this can cause significant decreases in both, he should take his blood pressure and pulse daily, do not take if BP less than 11/60 or pulse < 60, stop for anything concerning whatsoever including SOB, CP. Discussed his memory, He forgetting conversation, things wife tells you, people's names, no dementia in the family. Been going on slowly progressive for at least 2 years. He takes b12 shots. He used to have a cpap and stopped. He is tired during the  day. He snores, his wife doesn't. Last sleep study was 4-5 years ago. He is Morbidly obese. Epworth sleep scale only 5/10, doesn't appear to be candidate for sleep stufy. Discussed neurocognitive testing as well.   HPI:  [name] is a 86 y.o. male here as a referral from Dr. [place] Crane for tremor. PMHx CKD, Type 2 DM, HLD, HTN, insomnia, OSA, depression, polyneuropathy, cerebral ischemia, b12 deficiency, mantle cell lymphoma, anxiety, parkinson's diseas (?). He goes by the name [name]. Tremor has been going on for over a year. Worsening. Some days he doesn't have it. Today is a good day. When he has it, it is continuous, both resting and postural. He is weak all over, he has pain all over, he went to physical therapy and improved. He has been taking carbidopa/levodopa and it has not helped the tremor, he got it from a doctor in [place]. Tremor started more than year. Getting worse. He has a difficult time writing mostly. No difficulty eating, denies excessive coffee. Nothing makes it better or unknown.  Grandmother had a tremor, she had it severely and also her head shook and she shook all over. Sister has a similar tremor. He has trouble turning pages due to the tremor. Slowly progressive. No other focal neurologic deficits.       Review of Systems: Patient complains of symptoms per HPI.  Pertinent negatives per HPI. All others negative.    Social History   Socioeconomic History   Marital status: Married    Spouse name: [name]   Number of children: 2   Years of education: 9th grade   Highest education level: Not on file  Occupational History   Occupation: Retired     Fish farm manager: RETIRED  Tobacco Use   Smoking status: Never   Smokeless tobacco: Never  Vaping Use   Vaping Use: Never used  Substance and Sexual Activity   Alcohol use: No   Drug use: No   Sexual activity: Yes    Birth control/protection: None  Other Topics Concern   Not on file  Social History Narrative   Lives  with wife   Caffeine use: coffee 1 cup some days of the week. Update 02/10/2020 ("very rarely though")   Right-handed   Social Determinants of Health   Financial Resource Strain: Not on file  Food Insecurity: Not on file  Transportation Needs: Not on file  Physical Activity: Not on file  Stress: Not on file  Social Connections: Not on file  Intimate Partner Violence: Not on file    Family History  Problem Relation Age of Onset   Colon cancer Brother    Cancer Brother    Diabetes Father    Cancer Brother     Past Medical  History:  Diagnosis Date   B12 deficiency 02/07/2014   Colon cancer [facility]    Coronary atherosclerosis of native coronary artery    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, anomalous circumflex - no PCIs   Essential hypertension, benign    GERD (gastroesophageal reflux disease)    Mixed hyperlipidemia    Obstructive sleep apnea    does not use   Prostate cancer ([place])    PVD (peripheral vascular disease) ([place])    Type 2 diabetes mellitus ([place])     Past Surgical History:  Procedure Laterality Date   BACK SURGERY     BALLOON DILATION N/A 08/27/2013   Procedure: BALLOON DILATION;  Surgeon: Rogene Houston, MD;  [facility];  Service: Endoscopy;  Laterality: N/A;   COLON SURGERY     COLONOSCOPY N/A 12/17/2013   Procedure: COLONOSCOPY;  Surgeon: Rogene Houston, MD;  [facility];  Service: Endoscopy;  Laterality: N/A;  940   COLONOSCOPY N/A 02/28/2014   Procedure: COLONOSCOPY;  Surgeon: Rogene Houston, MD;  [facility];  Service: Endoscopy;  Laterality: N/A;  730   COLONOSCOPY N/A 08/04/2015   Procedure: COLONOSCOPY;  Surgeon: Rogene Houston, MD;  [facility];  Service: Endoscopy;  Laterality: N/A;  200 - moved to 11/11 @ 2:10 - Ann to notify pt   COLONOSCOPY WITH ESOPHAGOGASTRODUODENOSCOPY (EGD) N/A 08/27/2013   Procedure: COLONOSCOPY WITH ESOPHAGOGASTRODUODENOSCOPY (EGD);  Surgeon: Rogene Houston, MD;   [facility];  Service: Endoscopy;  Laterality: N/A;  730   CYSTOSCOPY N/A 06/09/2021   Procedure: CYSTOSCOPY;  Surgeon: Lucas Mallow, MD;  Location: [facility];  Service: Urology;  Laterality: N/A;   IR REMOVAL TUN ACCESS W/ PORT W/O FL MOD SED  07/17/2018   KNEE ARTHROSCOPY Left    KNEE SURGERY Right    total knee   MALONEY DILATION N/A 08/27/2013   Procedure: MALONEY DILATION;  Surgeon: Rogene Houston, MD;  [facility];  Service: Endoscopy;  Laterality: N/A;   PORTACATH PLACEMENT Right 09/23/2012   PROSTATECTOMY     REMOVAL OF PENILE PROSTHESIS N/A 06/09/2021   Procedure: REMOVAL OF ARTIFICIAL URINARY SPHINCER; REMOVAL OF INFLATABLE PENILE PROSTHESIS;  Surgeon: Lucas Mallow, MD;  Location: [facility];  Service: Urology;  Laterality: N/A;   removal of port Right    SAVORY DILATION N/A 08/27/2013   Procedure: SAVORY DILATION;  Surgeon: Rogene Houston, MD;  [facility];  Service: Endoscopy;  Laterality: N/A;   SHOULDER SURGERY     TONSILLECTOMY     URINARY SPHINCTER IMPLANT N/A 01/23/2017   artificial urinary sphincter implant by Dr. Odis Luster   VASECTOMY      Current Outpatient Medications  Medication Sig Dispense Refill   Accu-Chek FastClix Lancets MISC Apply topically.     ACCU-CHEK GUIDE test strip      acetaminophen (TYLENOL) 325 MG tablet Take 650 mg by mouth every 4 (four) hours as needed for mild pain or moderate pain. Take 2 tablets 1 hour prior to Rituxan treatment.     albuterol (PROAIR HFA) 108 (90 Base) MCG/ACT inhaler Inhale 2 puffs into the lungs at bedtime as needed for wheezing or shortness of breath. 18 g 1   amLODipine (NORVASC) 5 MG tablet Take 1 tablet (5 mg total) by mouth daily. 90 tablet 3   budesonide-formoterol (SYMBICORT) 160-4.5 MCG/ACT inhaler Inhale 2 puffs into the lungs 2 (two) times daily. 1  each 11   docusate sodium (COLACE) 100 MG capsule Take 200 mg by mouth daily as needed for mild constipation.     fluticasone  (FLONASE) 50 MCG/ACT nasal spray Place 1 spray into both nostrils as needed for allergies or rhinitis.     gabapentin (NEURONTIN) 300 MG capsule Take 1 cap in AM and 2 caps at night (300AM, 600PM) 270 capsule 3   glipiZIDE (GLUCOTROL) 10 MG tablet Take 10 mg by mouth 2 (two) times daily before a meal.      HYDROcodone-acetaminophen (NORCO/VICODIN) 5-325 MG tablet Take 1 tablet by mouth every 4 (four) hours as needed for moderate pain. 20 tablet 0   hydrocortisone 2.5 % cream Apply 1 application topically as needed (itching).     insulin lispro (HUMALOG) 100 UNIT/ML cartridge Inject 1-10 Units into the skin as directed. On sliding scale when eating a big meal     ipratropium (ATROVENT) 0.06 % nasal spray Place 2 sprays into both nostrils 2 (two) times daily.      meclizine (ANTIVERT) 25 MG tablet Take 25 mg by mouth 2 (two) times daily as needed for dizziness.      propranolol ER (INDERAL LA) 60 MG 24 hr capsule Take 1 capsule (60 mg total) by mouth at bedtime. For tremor. 30 capsule 0   temazepam (RESTORIL) 15 MG capsule Take 15 mg by mouth at bedtime as needed for sleep.     testosterone cypionate (DEPOTESTOSTERONE CYPIONATE) 200 MG/ML injection Inject 200 mg into the muscle every 14 (fourteen) days.      TOUJEO SOLOSTAR 300 UNIT/ML Solostar Pen Inject 25 Units into the skin every morning. (Patient taking differently: Inject 80 Units into the skin every morning.)     pantoprazole (PROTONIX) 40 MG tablet TAKE 1 TABLET BY MOUTH 2 TIMES DAILY BEFORE A MEAL. (Patient taking differently: Take 40 mg by mouth 2 (two) times daily.) 180 tablet 0   No current facility-administered medications for this visit.   Facility-Administered Medications Ordered in Other Visits  Medication Dose Route Frequency Provider Last Rate Last Admin   heparin lock flush 100 unit/mL  500 Units Intravenous Once Kefalas, Thomas S, PA-C       sodium chloride flush (NS) 0.9 % injection 20 mL  20 mL Intravenous PRN Baird Cancer, PA-C        Allergies as of 08/20/2021 - Review Complete 08/20/2021  Allergen Reaction Noted   Bee venom Anaphylaxis    Adhesive [tape] Hives    Ciprofloxacin     Codeine     Iodine     Latex     Povidone-iodine  10/15/2010   Simvastatin      Vitals: Today's Vitals   08/20/21 0829  BP: 133/76  Pulse: 89  Weight: 224 lb (101.6 kg)  Height: 5' 11"  (1.803 m)    Body mass index is 31.24 kg/m.   PHYSICAL EXAM:   Cranial Nerves:    The pupils are equal, round, and reactive to light. Visual fields are full to finger confrontation. Extraocular movements are intact. Trigeminal sensation is intact and the muscles of mastication are normal. The face is symmetric. The palate elevates in the midline. Hearing intact. Voice is normal. Shoulder shrug is normal. The tongue has normal motion without fasciculations.    Coordination:    Normal finger to nose and heel to shin.    Gait:    Mildly wide based, mild unsteadiness initially. Ambulates without assistive device.  Romberg negative.  Difficulty  performing tandem walk and heel toe   Motor Observation:    No asymmetry, no atrophy.  High-frequency low amplitude tremor R>L UE with postural and action components.  No evidence of tremor at rest, cogwheel rigidity or bradykinesia  Tone:    Normal muscle tone.     Posture:    Posture is normal. normal erect     Strength:    very slight mild weakness in all extremities - BLE > BUE       Sensation: dec pin prick, toes to the knees. Absent vibration at the toes but equal at ankles and knees.        Reflex Exam:   DTR's:    Deep tendon reflexes in the upper and lower extremities are hypo bilaterally.    Toes:    The toes are downgoing bilaterally.    Clonus:    Clonus is absent.        Assessment/Plan: Very pleasant 86 year old male with what appears to be a familial essential tremor, memory loss (mild cogniitve impairment) and peripheral neuropathy.  Family history of  tremor.  No evidence of Parkinson's or parkinsonism.  Multiple hospitalizations in 8-05/2021 for COVID + and recurrent UTI with eventual sepsis in September with continued gait impairment with imbalance     Essential tremor  -Reports worsening since decreased propranolol dosage after September admission -Recommend increase propranolol ER from 66m to 814mnightly.  Hesitant to increase further at this time due to c/o imbalance (see below). Discussed potential side effects and he wishes to proceed -discussed use of weighted pen to help with tremor interfering with writing -Prior intolerance to primidone  Peripheral neuropathy:  -stable -Continue gabapentin to 300 mg AM and 60028mM -updated prescription provided - prior B12, B1, B6, thyroid panel with TSH WNL   Memory loss:  Stable Continue to monitor  Gait impairment -present since 04/2021 likely in setting of recurrent hospitalizations with likely deconditioning  -Generalized weakness on today's exam but not specifically localized to one side. Denies one sided numbness/tingling, dizziness/vertigo, vision changes or speech/language difficulties [facility] negative for acute findings. No indication for repeat imaging at this time but discussed calling 911 immediately with any new stroke/TIA symptoms -discussed benefit with therapy but he declines at this time     Follow-up in 3-4 months with Dr. AheJaynee Eagles call earlier if needed    CC:  HalCelene SquibbD    I spent 38 minutes of face-to-face and non-face-to-face time with patient.  This included previsit chart review including review of multiple hospitalizations, lab review, study review, order entry, electronic health record documentation, patient discussion regarding above diagnosis, pertinent education and use of medications and answered all other questions to patient satisfaction   JesFrann RiderGNNorthwest [facility] [id number] ShoreseLynnC [id number] [phone]: This document was prepared with digital dictation and possible smart phrase technology. Any transcriptional errors that result from this process are unintentional.

## 2021-08-27 ENCOUNTER — Encounter (HOSPITAL_COMMUNITY): Payer: Self-pay

## 2021-08-27 ENCOUNTER — Inpatient Hospital Stay (HOSPITAL_COMMUNITY)
Admission: EM | Admit: 2021-08-27 | Discharge: 2021-08-31 | DRG: 871 | Disposition: A | Discharge status: Home / Self Care | Payer: Medicare Other | Attending: Family Medicine | Admitting: Family Medicine

## 2021-08-27 ENCOUNTER — Emergency Department (HOSPITAL_COMMUNITY): Payer: Medicare Other

## 2021-08-27 ENCOUNTER — Other Ambulatory Visit: Payer: Self-pay

## 2021-08-27 DIAGNOSIS — N1832 Chronic kidney disease, stage 3b: Secondary | ICD-10-CM | POA: Diagnosis not present

## 2021-08-27 DIAGNOSIS — A419 Sepsis, unspecified organism: Secondary | ICD-10-CM | POA: Diagnosis not present

## 2021-08-27 DIAGNOSIS — E1142 Type 2 diabetes mellitus with diabetic polyneuropathy: Secondary | ICD-10-CM | POA: Diagnosis not present

## 2021-08-27 DIAGNOSIS — M6281 Muscle weakness (generalized): Secondary | ICD-10-CM | POA: Diagnosis not present

## 2021-08-27 DIAGNOSIS — Z888 Allergy status to other drugs, medicaments and biological substances status: Secondary | ICD-10-CM

## 2021-08-27 DIAGNOSIS — Z6831 Body mass index (BMI) 31.0-31.9, adult: Secondary | ICD-10-CM | POA: Diagnosis not present

## 2021-08-27 DIAGNOSIS — J9601 Acute respiratory failure with hypoxia: Secondary | ICD-10-CM | POA: Diagnosis present

## 2021-08-27 DIAGNOSIS — R404 Transient alteration of awareness: Secondary | ICD-10-CM | POA: Diagnosis not present

## 2021-08-27 DIAGNOSIS — K8020 Calculus of gallbladder without cholecystitis without obstruction: Secondary | ICD-10-CM | POA: Diagnosis not present

## 2021-08-27 DIAGNOSIS — I2510 Atherosclerotic heart disease of native coronary artery without angina pectoris: Secondary | ICD-10-CM | POA: Diagnosis present

## 2021-08-27 DIAGNOSIS — Z800 Family history of malignant neoplasm of digestive organs: Secondary | ICD-10-CM

## 2021-08-27 DIAGNOSIS — R339 Retention of urine, unspecified: Secondary | ICD-10-CM | POA: Diagnosis not present

## 2021-08-27 DIAGNOSIS — N1330 Unspecified hydronephrosis: Secondary | ICD-10-CM | POA: Diagnosis present

## 2021-08-27 DIAGNOSIS — G629 Polyneuropathy, unspecified: Secondary | ICD-10-CM

## 2021-08-27 DIAGNOSIS — N136 Pyonephrosis: Secondary | ICD-10-CM | POA: Diagnosis present

## 2021-08-27 DIAGNOSIS — C61 Malignant neoplasm of prostate: Secondary | ICD-10-CM | POA: Diagnosis present

## 2021-08-27 DIAGNOSIS — I130 Hypertensive heart and chronic kidney disease with heart failure and stage 1 through stage 4 chronic kidney disease, or unspecified chronic kidney disease: Secondary | ICD-10-CM | POA: Diagnosis present

## 2021-08-27 DIAGNOSIS — K8689 Other specified diseases of pancreas: Secondary | ICD-10-CM | POA: Diagnosis not present

## 2021-08-27 DIAGNOSIS — L299 Pruritus, unspecified: Secondary | ICD-10-CM | POA: Diagnosis not present

## 2021-08-27 DIAGNOSIS — G9341 Metabolic encephalopathy: Secondary | ICD-10-CM | POA: Diagnosis present

## 2021-08-27 DIAGNOSIS — Z85038 Personal history of other malignant neoplasm of large intestine: Secondary | ICD-10-CM

## 2021-08-27 DIAGNOSIS — R6520 Severe sepsis without septic shock: Secondary | ICD-10-CM | POA: Diagnosis present

## 2021-08-27 DIAGNOSIS — R251 Tremor, unspecified: Secondary | ICD-10-CM | POA: Diagnosis not present

## 2021-08-27 DIAGNOSIS — N320 Bladder-neck obstruction: Secondary | ICD-10-CM | POA: Diagnosis not present

## 2021-08-27 DIAGNOSIS — G250 Essential tremor: Secondary | ICD-10-CM | POA: Diagnosis not present

## 2021-08-27 DIAGNOSIS — D696 Thrombocytopenia, unspecified: Secondary | ICD-10-CM | POA: Diagnosis not present

## 2021-08-27 DIAGNOSIS — Z23 Encounter for immunization: Secondary | ICD-10-CM

## 2021-08-27 DIAGNOSIS — J4520 Mild intermittent asthma, uncomplicated: Secondary | ICD-10-CM | POA: Diagnosis not present

## 2021-08-27 DIAGNOSIS — E669 Obesity, unspecified: Secondary | ICD-10-CM

## 2021-08-27 DIAGNOSIS — Z885 Allergy status to narcotic agent status: Secondary | ICD-10-CM | POA: Diagnosis not present

## 2021-08-27 DIAGNOSIS — N179 Acute kidney failure, unspecified: Secondary | ICD-10-CM | POA: Diagnosis present

## 2021-08-27 DIAGNOSIS — J45909 Unspecified asthma, uncomplicated: Secondary | ICD-10-CM | POA: Diagnosis present

## 2021-08-27 DIAGNOSIS — I499 Cardiac arrhythmia, unspecified: Secondary | ICD-10-CM | POA: Diagnosis not present

## 2021-08-27 DIAGNOSIS — E782 Mixed hyperlipidemia: Secondary | ICD-10-CM | POA: Diagnosis present

## 2021-08-27 DIAGNOSIS — Z8572 Personal history of non-Hodgkin lymphomas: Secondary | ICD-10-CM

## 2021-08-27 DIAGNOSIS — Z7984 Long term (current) use of oral hypoglycemic drugs: Secondary | ICD-10-CM

## 2021-08-27 DIAGNOSIS — E1151 Type 2 diabetes mellitus with diabetic peripheral angiopathy without gangrene: Secondary | ICD-10-CM | POA: Diagnosis not present

## 2021-08-27 DIAGNOSIS — R6889 Other general symptoms and signs: Secondary | ICD-10-CM | POA: Diagnosis not present

## 2021-08-27 DIAGNOSIS — Z833 Family history of diabetes mellitus: Secondary | ICD-10-CM

## 2021-08-27 DIAGNOSIS — E1122 Type 2 diabetes mellitus with diabetic chronic kidney disease: Secondary | ICD-10-CM | POA: Diagnosis not present

## 2021-08-27 DIAGNOSIS — Z8546 Personal history of malignant neoplasm of prostate: Secondary | ICD-10-CM

## 2021-08-27 DIAGNOSIS — N10 Acute tubulo-interstitial nephritis: Secondary | ICD-10-CM

## 2021-08-27 DIAGNOSIS — N390 Urinary tract infection, site not specified: Secondary | ICD-10-CM | POA: Insufficient documentation

## 2021-08-27 DIAGNOSIS — K5790 Diverticulosis of intestine, part unspecified, without perforation or abscess without bleeding: Secondary | ICD-10-CM | POA: Diagnosis not present

## 2021-08-27 DIAGNOSIS — K219 Gastro-esophageal reflux disease without esophagitis: Secondary | ICD-10-CM | POA: Diagnosis not present

## 2021-08-27 DIAGNOSIS — Z794 Long term (current) use of insulin: Secondary | ICD-10-CM

## 2021-08-27 DIAGNOSIS — E1165 Type 2 diabetes mellitus with hyperglycemia: Secondary | ICD-10-CM | POA: Diagnosis present

## 2021-08-27 DIAGNOSIS — Z91040 Latex allergy status: Secondary | ICD-10-CM

## 2021-08-27 DIAGNOSIS — Z20822 Contact with and (suspected) exposure to covid-19: Secondary | ICD-10-CM | POA: Diagnosis not present

## 2021-08-27 DIAGNOSIS — Z8616 Personal history of COVID-19: Secondary | ICD-10-CM | POA: Diagnosis not present

## 2021-08-27 DIAGNOSIS — D6949 Other primary thrombocytopenia: Secondary | ICD-10-CM | POA: Diagnosis not present

## 2021-08-27 DIAGNOSIS — Z743 Need for continuous supervision: Secondary | ICD-10-CM | POA: Diagnosis not present

## 2021-08-27 DIAGNOSIS — R739 Hyperglycemia, unspecified: Secondary | ICD-10-CM | POA: Diagnosis not present

## 2021-08-27 DIAGNOSIS — C8310 Mantle cell lymphoma, unspecified site: Secondary | ICD-10-CM | POA: Diagnosis present

## 2021-08-27 DIAGNOSIS — G4733 Obstructive sleep apnea (adult) (pediatric): Secondary | ICD-10-CM

## 2021-08-27 DIAGNOSIS — J45901 Unspecified asthma with (acute) exacerbation: Secondary | ICD-10-CM | POA: Diagnosis not present

## 2021-08-27 DIAGNOSIS — Z87440 Personal history of urinary (tract) infections: Secondary | ICD-10-CM

## 2021-08-27 DIAGNOSIS — G4700 Insomnia, unspecified: Secondary | ICD-10-CM | POA: Diagnosis not present

## 2021-08-27 DIAGNOSIS — A4151 Sepsis due to Escherichia coli [E. coli]: Principal | ICD-10-CM | POA: Diagnosis present

## 2021-08-27 DIAGNOSIS — Z79899 Other long term (current) drug therapy: Secondary | ICD-10-CM

## 2021-08-27 DIAGNOSIS — Z79890 Hormone replacement therapy (postmenopausal): Secondary | ICD-10-CM

## 2021-08-27 DIAGNOSIS — I517 Cardiomegaly: Secondary | ICD-10-CM | POA: Diagnosis not present

## 2021-08-27 DIAGNOSIS — R0789 Other chest pain: Secondary | ICD-10-CM | POA: Diagnosis not present

## 2021-08-27 DIAGNOSIS — Z91030 Bee allergy status: Secondary | ICD-10-CM

## 2021-08-27 DIAGNOSIS — E1140 Type 2 diabetes mellitus with diabetic neuropathy, unspecified: Secondary | ICD-10-CM | POA: Diagnosis not present

## 2021-08-27 DIAGNOSIS — Z881 Allergy status to other antibiotic agents status: Secondary | ICD-10-CM

## 2021-08-27 DIAGNOSIS — R509 Fever, unspecified: Secondary | ICD-10-CM | POA: Diagnosis not present

## 2021-08-27 LAB — COMPREHENSIVE METABOLIC PANEL
ALT: 29 U/L (ref 0–44)
AST: 20 U/L (ref 15–41)
Albumin: 3.9 g/dL (ref 3.5–5.0)
Alkaline Phosphatase: 67 U/L (ref 38–126)
Anion gap: 9 (ref 5–15)
BUN: 37 mg/dL — ABNORMAL HIGH (ref 8–23)
CO2: 28 mmol/L (ref 22–32)
Calcium: 8.8 mg/dL — ABNORMAL LOW (ref 8.9–10.3)
Chloride: 97 mmol/L — ABNORMAL LOW (ref 98–111)
Creatinine, Ser: 2.06 mg/dL — ABNORMAL HIGH (ref 0.61–1.24)
GFR, Estimated: 31 mL/min — ABNORMAL LOW (ref >60)
Glucose, Bld: 407 mg/dL — ABNORMAL HIGH (ref 70–99)
Potassium: 5 mmol/L (ref 3.5–5.1)
Sodium: 134 mmol/L — ABNORMAL LOW (ref 135–145)
Total Bilirubin: 1.2 mg/dL (ref 0.3–1.2)
Total Protein: 7 g/dL (ref 6.5–8.1)

## 2021-08-27 LAB — CBC WITH DIFFERENTIAL/PLATELET
Abs Immature Granulocytes: 0.05 10*3/uL (ref 0.00–0.07)
Basophils Absolute: 0 10*3/uL (ref 0.0–0.1)
Basophils Relative: 0 %
Eosinophils Absolute: 0 10*3/uL (ref 0.0–0.5)
Eosinophils Relative: 0 %
HCT: 44.9 % (ref 39.0–52.0)
Hemoglobin: 14.5 g/dL (ref 13.0–17.0)
Immature Granulocytes: 1 %
Lymphocytes Relative: 11 %
Lymphs Abs: 0.5 10*3/uL — ABNORMAL LOW (ref 0.7–4.0)
MCH: 31.1 pg (ref 26.0–34.0)
MCHC: 32.3 g/dL (ref 30.0–36.0)
MCV: 96.4 fL (ref 80.0–100.0)
Monocytes Absolute: 0.3 10*3/uL (ref 0.1–1.0)
Monocytes Relative: 7 %
Neutro Abs: 3.5 10*3/uL (ref 1.7–7.7)
Neutrophils Relative %: 81 %
Platelets: 98 10*3/uL — ABNORMAL LOW (ref 150–400)
RBC: 4.66 MIL/uL (ref 4.22–5.81)
RDW: 17.6 % — ABNORMAL HIGH (ref 11.5–15.5)
WBC: 4.5 10*3/uL (ref 4.0–10.5)
nRBC: 0 % (ref 0.0–0.2)

## 2021-08-27 LAB — URINALYSIS, ROUTINE W REFLEX MICROSCOPIC
Bilirubin Urine: NEGATIVE
Glucose, UA: >=500 mg/dL — AB
Ketones, ur: NEGATIVE mg/dL
Nitrite: POSITIVE — AB
Protein, ur: 30 mg/dL — AB
Specific Gravity, Urine: 1.02 (ref 1.005–1.030)
pH: 5.5 (ref 5.0–8.0)

## 2021-08-27 LAB — URINALYSIS, MICROSCOPIC (REFLEX): WBC, UA: >50 WBC/hpf (ref 0–5)

## 2021-08-27 LAB — CBG MONITORING, ED: Glucose-Capillary: 351 mg/dL — ABNORMAL HIGH (ref 70–99)

## 2021-08-27 LAB — RESP PANEL BY RT-PCR (FLU A&B, COVID) ARPGX2
Influenza A by PCR: NEGATIVE
Influenza B by PCR: NEGATIVE
SARS Coronavirus 2 by RT PCR: NEGATIVE

## 2021-08-27 LAB — LACTIC ACID, PLASMA: Lactic Acid, Venous: 1.5 mmol/L (ref 0.5–1.9)

## 2021-08-27 MED ORDER — SODIUM CHLORIDE 0.9 % IV SOLN
2.0000 g | Freq: Once | INTRAVENOUS | Status: AC
  Administered 2021-08-27: 2 g via INTRAVENOUS
  Filled 2021-08-27: qty 20

## 2021-08-27 MED ORDER — SODIUM CHLORIDE 0.9 % IV BOLUS
1000.0000 mL | Freq: Once | INTRAVENOUS | Status: AC
  Administered 2021-08-27: 1000 mL via INTRAVENOUS

## 2021-08-27 MED ORDER — ACETAMINOPHEN 325 MG PO TABS
650.0000 mg | ORAL_TABLET | Freq: Once | ORAL | Status: AC
  Administered 2021-08-27: 650 mg via ORAL
  Filled 2021-08-27: qty 2

## 2021-08-27 NOTE — ED Provider Notes (Signed)
[facility] Provider Note   CSN: [id number] Arrival date & time: 08/27/21  1809     History Chief Complaint  Patient presents with   Generalized Body Aches    [name] is a 86 y.o. male.  86 year old male who presents emerged from today for feeling unwell.  Also has some generalized body aches.  States he is felt like this before when he had infections specifically of his urine.  Cannot offer much else detailed secondary to being slightly altered.   Of note, patient in the hospital for multiple hours prior to my evaluation and treatment.       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, anomalous circumflex - no PCIs   Essential hypertension, benign    GERD (gastroesophageal reflux disease)    Mixed hyperlipidemia    Obstructive sleep apnea    does not use   Prostate cancer ([place])    PVD (peripheral vascular disease) ([place])    Type 2 diabetes mellitus ([place])     Patient Active Problem List   Diagnosis Date Noted   COVID-19 virus infection 06/04/2021   Imbalance 05/06/2021   Hydronephrosis 05/06/2021   Acute respiratory failure with hypoxia ([place]) 05/06/2021   Acute asthma exacerbation 05/06/2021   Type 2 diabetes mellitus ([place])    Proteinuria 03/14/2021   Diabetic peripheral neuropathy ([place]) 03/07/2021   Asymptomatic cholelithiasis 07/06/2019   Peripheral polyneuropathy 01/16/2019   Multiple pulmonary nodules 08/01/2016   Upper airway cough syndrome 07/30/2016   Chronic asthma vs UACS  07/11/2016   Dupuytren's contracture 06/06/2016   Mild cognitive impairment 01/22/2016   Essential tremor 10/17/2015   Chronic kidney disease, stage 3b ([place]) 07/02/2015   AKI (acute kidney injury) ([place]) 06/30/2015   Acute kidney injury ([place]) 06/30/2015   Status post total right knee replacement 03/29/2014    B12 deficiency 02/07/2014   Diverticulosis of colon with hemorrhage 01/01/2014   Thrombocytopenia, unspecified ([place]) 01/01/2014   Acute blood loss anemia 01/01/2014   Leukopenia 12/31/2013   Lower GI bleed 12/30/2013   Diverticulitis 12/30/2013   Abdominal pain, left lower quadrant 11/16/2013   Abdominal pain, unspecified site 11/16/2013   Mantle cell lymphoma ([place]) 09/07/2013   Prostate cancer ([place]) 09/07/2013   Proctitis, 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   H/O ulcerative colitis 04/12/2013   OSA (obstructive sleep apnea) 08/15/2012   VASOMOTOR RHINITIS 01/23/2010   GERD (gastroesophageal reflux disease) 01/23/2010   SOB (shortness of breath) 01/01/2010   Mixed hyperlipidemia 11/23/2009   Hypertension 11/23/2009   Coronary atherosclerosis of native coronary artery 02/18/2009    Past Surgical History:  Procedure Laterality Date   BACK SURGERY     BALLOON DILATION N/A 08/27/2013   Procedure: BALLOON DILATION;  Surgeon: Rogene Houston, MD;  [facility];  Service: Endoscopy;  Laterality: N/A;   COLON SURGERY     COLONOSCOPY N/A 12/17/2013   Procedure: COLONOSCOPY;  Surgeon: Rogene Houston, MD;  [facility];  Service: Endoscopy;  Laterality: N/A;  940   COLONOSCOPY N/A 02/28/2014   Procedure: COLONOSCOPY;  Surgeon: Rogene Houston, MD;  [facility];  Service: Endoscopy;  Laterality: N/A;  730   COLONOSCOPY N/A 08/04/2015   Procedure: COLONOSCOPY;  Surgeon: Rogene Houston, MD;  [facility];  Service: Endoscopy;  Laterality: N/A;  200 - moved to 11/11 @ 2:10 - Ann to notify pt   COLONOSCOPY WITH ESOPHAGOGASTRODUODENOSCOPY (EGD) N/A 08/27/2013   Procedure: COLONOSCOPY WITH ESOPHAGOGASTRODUODENOSCOPY (EGD);  Surgeon: Rogene Houston, MD;  [facility];  Service: Endoscopy;  Laterality: N/A;  730   CYSTOSCOPY N/A 06/09/2021   Procedure: CYSTOSCOPY;  Surgeon: Lucas Mallow, MD;  Location: [facility];  Service: Urology;  Laterality: N/A;    IR REMOVAL TUN ACCESS W/ PORT W/O FL MOD SED  07/17/2018   KNEE ARTHROSCOPY Left    KNEE SURGERY Right    total knee   MALONEY DILATION N/A 08/27/2013   Procedure: MALONEY DILATION;  Surgeon: Rogene Houston, MD;  [facility];  Service: Endoscopy;  Laterality: N/A;   PORTACATH PLACEMENT Right 09/23/2012   PROSTATECTOMY     REMOVAL OF PENILE PROSTHESIS N/A 06/09/2021   Procedure: REMOVAL OF ARTIFICIAL URINARY SPHINCER; REMOVAL OF INFLATABLE PENILE PROSTHESIS;  Surgeon: Lucas Mallow, MD;  Location: [facility];  Service: Urology;  Laterality: N/A;   removal of port Right    SAVORY DILATION N/A 08/27/2013   Procedure: SAVORY DILATION;  Surgeon: Rogene Houston, MD;  [facility];  Service: Endoscopy;  Laterality: N/A;   SHOULDER SURGERY     TONSILLECTOMY     URINARY SPHINCTER IMPLANT N/A 01/23/2017   artificial urinary sphincter implant by Dr. Odis Luster   VASECTOMY         Family History  Problem Relation Age of Onset   Colon cancer Brother    Cancer Brother    Diabetes Father    Cancer Brother     Social History   Tobacco Use   Smoking status: Never   Smokeless tobacco: Never  Vaping Use   Vaping Use: Never used  Substance Use Topics   Alcohol use: No   Drug use: No    Home Medications Prior to Admission medications   Medication Sig Start Date End Date Taking? Authorizing Provider  Accu-Chek FastClix Lancets MISC Apply topically. 07/06/20   [provider]  ACCU-CHEK GUIDE test strip  07/06/20   [provider]  acetaminophen (TYLENOL) 325 MG tablet Take 650 mg by mouth every 4 (four) hours as needed for mild pain or moderate pain. Take 2 tablets 1 hour prior to Rituxan treatment.    [provider]  albuterol (PROAIR HFA) 108 (90 Base) MCG/ACT inhaler Inhale 2 puffs into the lungs at bedtime as needed for wheezing or shortness of breath. 05/12/20   Tanda Rockers, MD  amLODipine (NORVASC) 5 MG tablet Take 1 tablet (5  mg total) by mouth daily. 09/12/20   Burtis Junes, NP  budesonide-formoterol (SYMBICORT) 160-4.5 MCG/ACT inhaler Inhale 2 puffs into the lungs 2 (two) times daily. 05/12/20   Tanda Rockers, MD  docusate sodium (COLACE) 100 MG capsule Take 200 mg by mouth daily as needed for mild constipation.    [provider]  fluticasone (FLONASE) 50 MCG/ACT nasal spray Place 1 spray into both nostrils as needed for allergies or rhinitis.    [provider]  gabapentin (NEURONTIN) 300 MG capsule Take 1 cap in AM and 2 caps at night (300AM, 600PM) 02/15/21   Frann Rider, NP  glipiZIDE (GLUCOTROL) 10 MG tablet Take 10 mg by mouth 2 (two) times daily before a meal.  01/09/16   [provider]  HYDROcodone-acetaminophen (NORCO/VICODIN) 5-325 MG tablet Take 1 tablet by mouth every 4 (four) hours as needed  for moderate pain. 06/11/21 06/11/22  Little Ishikawa, MD  hydrocortisone 2.5 % cream Apply 1 application topically as needed (itching).    [provider]  insulin lispro (HUMALOG) 100 UNIT/ML cartridge Inject 1-10 Units into the skin as directed. On sliding scale when eating a big meal    [provider]  ipratropium (ATROVENT) 0.06 % nasal spray Place 2 sprays into both nostrils 2 (two) times daily.  04/15/17   [provider]  meclizine (ANTIVERT) 25 MG tablet Take 25 mg by mouth 2 (two) times daily as needed for dizziness.  05/01/17   [provider]  pantoprazole (PROTONIX) 40 MG tablet TAKE 1 TABLET BY MOUTH 2 TIMES DAILY BEFORE A MEAL. Patient taking differently: Take 40 mg by mouth 2 (two) times daily. 05/08/20 07/31/21  Harvel Quale, MD  propranolol (INNOPRAN XL) 80 MG 24 hr capsule Take 1 capsule (80 mg total) by mouth at bedtime. 08/20/21   Frann Rider, NP  temazepam (RESTORIL) 15 MG capsule Take 15 mg by mouth at bedtime as needed for sleep. 06/26/20   [provider]  testosterone cypionate (DEPOTESTOSTERONE  CYPIONATE) 200 MG/ML injection Inject 200 mg into the muscle every 14 (fourteen) days.  04/29/16   [provider]  TOUJEO SOLOSTAR 300 UNIT/ML Solostar Pen Inject 25 Units into the skin every morning. Patient taking differently: Inject 80 Units into the skin every morning. 06/08/21   Johnson, Clanford L, MD  insulin aspart (NOVOLOG) 100 UNIT/ML injection Inject 20 Units into the skin 3 (three) times daily before meals.    12/13/11  [provider]  rosuvastatin (CRESTOR) 40 MG tablet Take 40 mg by mouth daily.    01/13/19  [provider]    Allergies    Bee venom, Adhesive [tape], Ciprofloxacin, Codeine, Iodine, Latex, Povidone-iodine, and Simvastatin  Review of Systems   Review of Systems  Unable to perform ROS: Mental status change   Physical Exam Updated Vital Signs BP 123/69   Pulse 78   Temp (!) 100.9 F (38.3 C) (Oral)   Resp 18   Ht 5' 11"  (1.803 m)   Wt 101 kg   SpO2 96%   BMI 31.06 kg/m   Physical Exam Vitals and nursing note reviewed.  Constitutional:      Appearance: He is well-developed.  HENT:     Head: Normocephalic and atraumatic.     Nose: Nose normal. No congestion or rhinorrhea.     Mouth/Throat:     Mouth: Mucous membranes are moist.     Pharynx: Oropharynx is clear.  Eyes:     Pupils: Pupils are equal, round, and reactive to light.  Cardiovascular:     Rate and Rhythm: Tachycardia present.  Pulmonary:     Effort: Pulmonary effort is normal. No respiratory distress.  Abdominal:     General: Abdomen is flat. There is no distension.     Palpations: There is no mass.     Tenderness: There is no abdominal tenderness.     Hernia: No hernia is present.  Musculoskeletal:        General: Normal range of motion.     Cervical back: Normal range of motion.  Skin:    General: Skin is warm and dry.  Neurological:     Mental Status: He is alert. He is disoriented.    ED Results / Procedures / Treatments   Labs (all labs ordered  are listed, but only abnormal results are displayed) Labs Reviewed  COMPREHENSIVE METABOLIC PANEL - Abnormal; Notable for the following components:      Result Value   Sodium 134 (*)    Chloride 97 (*)    Glucose, Bld 407 (*)    BUN 37 (*)    Creatinine, Ser 2.06 (*)    Calcium 8.8 (*)    GFR, Estimated 31 (*)    All other components within normal limits  CBC WITH DIFFERENTIAL/PLATELET - Abnormal; Notable for the following components:   RDW 17.6 (*)    Platelets 98 (*)    Lymphs Abs 0.5 (*)    All other components within normal limits  URINALYSIS, ROUTINE W REFLEX MICROSCOPIC - Abnormal; Notable for the following components:   APPearance HAZY (*)    Glucose, UA >=500 (*)    Hgb urine dipstick LARGE (*)    Protein, ur 30 (*)    Nitrite POSITIVE (*)    Leukocytes,Ua SMALL (*)    All other components within normal limits  URINALYSIS, MICROSCOPIC (REFLEX) - Abnormal; Notable for the following components:   Bacteria, UA MANY (*)    All other components within normal limits  CBG MONITORING, ED - Abnormal; Notable for the following components:   Glucose-Capillary 351 (*)    All other components within normal limits  RESP PANEL BY RT-PCR (FLU A&B, COVID) ARPGX2  CULTURE, BLOOD (ROUTINE X 2)  CULTURE, BLOOD (ROUTINE X 2)  LACTIC ACID, PLASMA    EKG None  Radiology DG Chest Portable 1 View  Result Date: 08/27/2021 CLINICAL DATA:  Fever. EXAM: PORTABLE CHEST 1 VIEW COMPARISON:  Chest radiograph dated 06/04/2021. FINDINGS: Shallow inspiration. There is mild cardiomegaly with mild vascular congestion. No focal consolidation, pleural effusion, pneumothorax. Atherosclerotic calcification of the aorta. Degenerative changes of the spine. Lower cervical ACDF. No acute osseous pathology. IMPRESSION: Cardiomegaly with mild vascular congestion. No focal consolidation. Electronically Signed   By: Anner Crete M.D.   On: 08/27/2021 18:57    Procedures Procedures   Medications Ordered in  ED Medications  cefTRIAXone (ROCEPHIN) 2 g in sodium chloride 0.9 % 100 mL IVPB (has no administration in time range)  sodium chloride 0.9 % bolus 1,000 mL (has no administration in time range)  acetaminophen (TYLENOL) tablet 650 mg (650 mg Oral Given 08/27/21 1830)    ED Course  I have reviewed the triage vital signs and the nursing notes.  Pertinent labs & imaging results that were available during my care of the patient were reviewed by me and considered in my medical decision making (see chart for details).    MDM Rules/Calculators/A&P                         Overall patient meets SIRS criteria with UTI however lactic's were negative.  Not likely to be severe sepsis but does have acute renal insufficiency likely related to dehydration.  All this improved with some fluids and antibiotics.  At the time my evaluation is too late and not sepsis criteria however blood cultures were drawn.  Discussed with hospitalist for admission  Final Clinical Impression(s) / ED Diagnoses Final diagnoses:  Sepsis, due to unspecified organism, unspecified whether acute organ dysfunction present [facility])  Urinary tract infection without hematuria, site unspecified    Rx / DC Orders ED Discharge Orders     None        Tkeya Stencil, Corene Cornea, MD 08/28/21 [phone]

## 2021-08-27 NOTE — H&P (Signed)
History and Physical  [name] TSV:[id number] DOB: [birth year] DOA: 08/27/2021  Referring physician: Merrily Pew, MD PCP: Celene Squibb, MD  Patient coming from: Home  Chief Complaint: Generalized body aches  HPI: [name] is a 86 y.o. male with medical history significant for hyperlipidemia, type 2 diabetes mellitus, hypertension, chronic kidney disease, CAD, B12 deficiency, mantle cell lymphoma of the colon, GERD, PVD, prostate CA, OSA not on CPAP, s/p COVID infection and treatment who presents to the emergency department via EMS due to generalized body aches which started few hours PTA.  Patient was unable to provide history due to altered mental status.  History was obtained from ED physician and ED medical record.  Per report, patient was not feeling well and endorsed same sensation during his last UTI.  EMS was activated and family states that patient has been having recurrent UTIs. It was noted that patient was admitted at [facility] from 9/12-9/20 due to severe sepsis secondary to E. coli bacteremia.  ED Course:  In the emergency department, he was febrile with a temperature of 103.54F, tachycardic and tachypneic.  Magnesium 1.5, CBC showed normal CBC except for thrombocytopenia.  BMP shows hyperglycemia, BUN/creatinine 37/2.06 (baseline creatinine at 1.3).  Urinalysis was indicative of UTI.  Influenza A, B, SARS coronavirus 2 was negative. Chest x-ray showed Cardiomegaly with mild vascular congestion. No focal consolidation. He was treated with IV ceftriaxone, IV hydration was provided and Tylenol was given due to fever.  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, anomalous circumflex - no PCIs   Essential hypertension, benign    GERD  (gastroesophageal reflux disease)    Mixed hyperlipidemia    Obstructive sleep apnea    does not use   Prostate cancer ([place])    PVD (peripheral vascular disease) ([place])    Type 2 diabetes mellitus ([place])    Past Surgical History:  Procedure Laterality Date   BACK SURGERY     BALLOON DILATION N/A 08/27/2013   Procedure: BALLOON DILATION;  Surgeon: Rogene Houston, MD;  [facility];  Service: Endoscopy;  Laterality: N/A;   COLON SURGERY     COLONOSCOPY N/A 12/17/2013   Procedure: COLONOSCOPY;  Surgeon: Rogene Houston, MD;  [facility];  Service: Endoscopy;  Laterality: N/A;  940   COLONOSCOPY N/A 02/28/2014   Procedure: COLONOSCOPY;  Surgeon: Rogene Houston, MD;  [facility];  Service: Endoscopy;  Laterality: N/A;  730   COLONOSCOPY N/A 08/04/2015   Procedure: COLONOSCOPY;  Surgeon: Rogene Houston, MD;  [facility];  Service: Endoscopy;  Laterality: N/A;  200 - moved to 11/11 @ 2:10 - Ann to notify pt   COLONOSCOPY WITH ESOPHAGOGASTRODUODENOSCOPY (EGD) N/A 08/27/2013   Procedure: COLONOSCOPY WITH ESOPHAGOGASTRODUODENOSCOPY (EGD);  Surgeon: Rogene Houston, MD;  [facility];  Service: Endoscopy;  Laterality: N/A;  730   CYSTOSCOPY N/A 06/09/2021   Procedure: CYSTOSCOPY;  Surgeon: Lucas Mallow, MD;  Location: [facility];  Service: Urology;  Laterality: N/A;   IR REMOVAL TUN ACCESS W/ PORT W/O FL MOD SED  07/17/2018   KNEE ARTHROSCOPY Left    KNEE SURGERY Right    total knee   MALONEY DILATION  N/A 08/27/2013   Procedure: Venia Minks DILATION;  Surgeon: Rogene Houston, MD;  [facility];  Service: Endoscopy;  Laterality: N/A;   PORTACATH PLACEMENT Right 09/23/2012   PROSTATECTOMY     REMOVAL OF PENILE PROSTHESIS N/A 06/09/2021   Procedure: REMOVAL OF ARTIFICIAL URINARY SPHINCER; REMOVAL OF INFLATABLE PENILE PROSTHESIS;  Surgeon: Lucas Mallow, MD;  Location: [facility];  Service: Urology;  Laterality: N/A;   removal of  port Right    SAVORY DILATION N/A 08/27/2013   Procedure: SAVORY DILATION;  Surgeon: Rogene Houston, MD;  [facility];  Service: Endoscopy;  Laterality: N/A;   SHOULDER SURGERY     TONSILLECTOMY     URINARY SPHINCTER IMPLANT N/A 01/23/2017   artificial urinary sphincter implant by Dr. Odis Luster   VASECTOMY      Social History:  reports that he has never smoked. He has never used smokeless tobacco. He reports that he does not drink alcohol and does not use drugs.   Allergies  Allergen Reactions   Bee Venom Anaphylaxis   Adhesive [Tape] Hives   Ciprofloxacin     Unknown    Codeine     Unknown     Iodine     Unknown     Latex     Unknown     Povidone-Iodine     Unknown     Simvastatin     Unknown      Family History  Problem Relation Age of Onset   Colon cancer Brother    Cancer Brother    Diabetes Father    Cancer Brother      Prior to Admission medications   Medication Sig Start Date End Date Taking? Authorizing Provider  Accu-Chek FastClix Lancets MISC Apply topically. 07/06/20   [provider]  ACCU-CHEK GUIDE test strip  07/06/20   [provider]  acetaminophen (TYLENOL) 325 MG tablet Take 650 mg by mouth every 4 (four) hours as needed for mild pain or moderate pain. Take 2 tablets 1 hour prior to Rituxan treatment.    [provider]  albuterol (PROAIR HFA) 108 (90 Base) MCG/ACT inhaler Inhale 2 puffs into the lungs at bedtime as needed for wheezing or shortness of breath. 05/12/20   Tanda Rockers, MD  amLODipine (NORVASC) 5 MG tablet Take 1 tablet (5 mg total) by mouth daily. 09/12/20   Burtis Junes, NP  budesonide-formoterol (SYMBICORT) 160-4.5 MCG/ACT inhaler Inhale 2 puffs into the lungs 2 (two) times daily. 05/12/20   Tanda Rockers, MD  docusate sodium (COLACE) 100 MG capsule Take 200 mg by mouth daily as needed for mild constipation.    [provider]  fluticasone (FLONASE) 50 MCG/ACT nasal spray  Place 1 spray into both nostrils as needed for allergies or rhinitis.    [provider]  gabapentin (NEURONTIN) 300 MG capsule Take 1 cap in AM and 2 caps at night (300AM, 600PM) 02/15/21   Frann Rider, NP  glipiZIDE (GLUCOTROL) 10 MG tablet Take 10 mg by mouth 2 (two) times daily before a meal.  01/09/16   [provider]  HYDROcodone-acetaminophen (NORCO/VICODIN) 5-325 MG tablet Take 1 tablet by mouth every 4 (four) hours as needed for moderate pain. 06/11/21 06/11/22  Little Ishikawa, MD  hydrocortisone 2.5 % cream Apply 1 application topically as needed (itching).    [provider]  insulin lispro (HUMALOG) 100 UNIT/ML cartridge Inject 1-10 Units into the skin as directed. On sliding scale  when eating a big meal    [provider]  ipratropium (ATROVENT) 0.06 % nasal spray Place 2 sprays into both nostrils 2 (two) times daily.  04/15/17   [provider]  meclizine (ANTIVERT) 25 MG tablet Take 25 mg by mouth 2 (two) times daily as needed for dizziness.  05/01/17   [provider]  pantoprazole (PROTONIX) 40 MG tablet TAKE 1 TABLET BY MOUTH 2 TIMES DAILY BEFORE A MEAL. Patient taking differently: Take 40 mg by mouth 2 (two) times daily. 05/08/20 07/31/21  Harvel Quale, MD  propranolol (INNOPRAN XL) 80 MG 24 hr capsule Take 1 capsule (80 mg total) by mouth at bedtime. 08/20/21   Frann Rider, NP  temazepam (RESTORIL) 15 MG capsule Take 15 mg by mouth at bedtime as needed for sleep. 06/26/20   [provider]  testosterone cypionate (DEPOTESTOSTERONE CYPIONATE) 200 MG/ML injection Inject 200 mg into the muscle every 14 (fourteen) days.  04/29/16   [provider]  TOUJEO SOLOSTAR 300 UNIT/ML Solostar Pen Inject 25 Units into the skin every morning. Patient taking differently: Inject 80 Units into the skin every morning. 06/08/21   Johnson, Clanford L, MD  insulin aspart (NOVOLOG) 100 UNIT/ML injection Inject 20 Units  into the skin 3 (three) times daily before meals.    12/13/11  [provider]  rosuvastatin (CRESTOR) 40 MG tablet Take 40 mg by mouth daily.    01/13/19  [provider]    Physical Exam: BP 127/72   Pulse 74   Temp (!) 103.1 F (39.5 C)   Resp (!) 22   Ht 5' 11"  (1.803 m)   Wt 101 kg   SpO2 93%   BMI 31.06 kg/m   General: 86 y.o. year-old male ill appearing, but in no acute distress.   HEENT: NCAT, EOMI Neck: Supple, trachea medial Cardiovascular: Tachycardia.  Regular rate and rhythm with no rubs or gallops.  No thyromegaly or JVD noted.  No lower extremity edema. 2/4 pulses in all 4 extremities. Respiratory: Clear to auscultation with no wheezes or rales. Good inspiratory effort. Abdomen: Soft, nontender nondistended with normal bowel sounds x4 quadrants. Muskuloskeletal: No cyanosis, clubbing or edema noted bilaterally Neuro:Alert, awake but disoriented. Noted tremors which worsens on movement in both hands. sensation, reflexes intact Skin: No ulcerative lesions noted or rashes Psychiatry: This cannot be obtained at this time due to patient's current condition         Labs on Admission:  Basic Metabolic Panel: Recent Labs  Lab 08/27/21 1836 08/28/21 0430  NA 134* 135  K 5.0 4.2  CL 97* 102  CO2 28 24  GLUCOSE 407* 416*  BUN 37* 38*  CREATININE 2.06* 1.83*  CALCIUM 8.8* 8.1*  MG  --  1.5*  PHOS  --  2.7   Liver Function Tests: Recent Labs  Lab 08/27/21 1836 08/28/21 0430  AST 20 16  ALT 29 24  ALKPHOS 67 57  BILITOT 1.2 1.2  PROT 7.0 6.0*  ALBUMIN 3.9 3.4*   No results for input(s): LIPASE, AMYLASE in the last 168 hours. No results for input(s): AMMONIA in the last 168 hours. CBC: Recent Labs  Lab 08/27/21 1836 08/28/21 0430  WBC 4.5 6.3  NEUTROABS 3.5  --   HGB 14.5 13.5  HCT 44.9 40.8  MCV 96.4 95.6  PLT 98* 92*   Cardiac Enzymes: No results for input(s): CKTOTAL, CKMB, CKMBINDEX, TROPONINI in the last 168 hours.  BNP  (last 3 results) Recent  Labs    06/04/21 2013  BNP 250.0*    ProBNP (last 3 results) No results for input(s): PROBNP in the last 8760 hours.  CBG: Recent Labs  Lab 08/27/21 1819  GLUCAP 351*    Radiological Exams on Admission: DG Chest Portable 1 View  Result Date: 08/27/2021 CLINICAL DATA:  Fever. EXAM: PORTABLE CHEST 1 VIEW COMPARISON:  Chest radiograph dated 06/04/2021. FINDINGS: Shallow inspiration. There is mild cardiomegaly with mild vascular congestion. No focal consolidation, pleural effusion, pneumothorax. Atherosclerotic calcification of the aorta. Degenerative changes of the spine. Lower cervical ACDF. No acute osseous pathology. IMPRESSION: Cardiomegaly with mild vascular congestion. No focal consolidation. Electronically Signed   By: Anner Crete M.D.   On: 08/27/2021 18:57    EKG: I independently viewed the EKG done and my findings are as followed: Sinus tachycardia at a rate of 109 bpm  Assessment/Plan Present on Admission:  UTI (urinary tract infection)  Thrombocytopenia, unspecified (HCC)  Acute kidney injury ([place])  Chronic kidney disease, stage 3b ([place])  GERD (gastroesophageal reflux disease)  OSA (obstructive sleep apnea)  Principal Problem:   UTI (urinary tract infection) Active Problems:   GERD (gastroesophageal reflux disease)   OSA (obstructive sleep apnea)   Thrombocytopenia, unspecified (HCC)   Acute kidney injury ([place])   Chronic kidney disease, stage 3b ([place])   Sepsis ([place])   Hyperglycemia due to diabetes mellitus ([place])   Hypomagnesemia   Obesity (BMI 30.0-34.9)   Tremors of nervous system   Acute metabolic encephalopathy secondary to sepsis due to UTI POA Patient tachycardic, tachypneic and febrile and source of infection was UTI thereby meeting sepsis criteria He was started on IV ceftriaxone, we shall continue same at this time pending urine culture and blood culture Urine culture done on 9/12 was positive for E. coli which was  sensitive to ceftriaxone Continue Tylenol as needed for fever  Acute kidney injury on CKD stage 3b BUN/creatinine 37/2.06 (baseline creatinine at 1.3) IV hydration was provided Renally adjust medications, avoid nephrotoxic agents/dehydration/hypotension  Hyperglycemia secondary to poorly controlled T2DM Hemoglobin A1c in September 2022 was 9.2 Continue ISS and hypoglycemic protocol Continue Semglee 10 units at this time and adjust dose as tolerated  ??  CHF Chest x-ray was suggestive of Cardiomegaly with mild vascular congestion. BNP will be checked Continue total input/output, daily weights and fluid restriction IV Lasix 20 mg x 1 will be given Continue Cardiac diet  Consider echocardiogram based on BNP findings and clinical presentation  Hypomagnesemia Mg 1.5.  This was replenished  Upper extremity tremors due to unknown cause at this time Correct electrolyte imbalance Continue gabapentin Continue fall precautions and neurochecks Continue to monitor for improvement as sepsis improves, otherwise, consider neurology consult  Acute on chronic thrombocytopenia Platelets 98, continue to monitor platelet levels   GERD Continue Protonix  Obstructive sleep apnea Patient was not on CPAP  History of mantle cell lymphoma Patient has not seen hematologist since 2019 per medical record  Obesity (BMI 31.06kg/m) Patient will be counseled on diet modification and weight loss when mental status improves  Essential hypertension Continue Norvasc  Asthma Continue Ventolin, Dulera, Flonase  Insomnia Continue Restoril   DVT prophylaxis: SCDs  Code Status: Full code  Family Communication: None at bedside  Disposition Plan:  Patient is from:                        home Anticipated DC to:  SNF or family members home Anticipated DC date:               2-3 days Anticipated DC barriers:         Patient requires inpatient management due to sepsis secondary to  UTI and requiring IV antibiotics    Consults called: None  Admission status: Patient's    Bernadette Hoit MD [facility]  08/28/2021, 7:43 AM

## 2021-08-27 NOTE — ED Notes (Signed)
AC called to bring a bagged meal for pt

## 2021-08-27 NOTE — ED Triage Notes (Signed)
Pt brought to ED via [facility] for generalized body aches for last few hours. Pt family told EMS he has been having chronic UTIs. Pt is warm to the touch.

## 2021-08-28 ENCOUNTER — Inpatient Hospital Stay (HOSPITAL_COMMUNITY): Payer: Medicare Other

## 2021-08-28 DIAGNOSIS — E1165 Type 2 diabetes mellitus with hyperglycemia: Secondary | ICD-10-CM | POA: Insufficient documentation

## 2021-08-28 DIAGNOSIS — G4700 Insomnia, unspecified: Secondary | ICD-10-CM | POA: Insufficient documentation

## 2021-08-28 DIAGNOSIS — A419 Sepsis, unspecified organism: Secondary | ICD-10-CM

## 2021-08-28 DIAGNOSIS — R739 Hyperglycemia, unspecified: Secondary | ICD-10-CM | POA: Insufficient documentation

## 2021-08-28 DIAGNOSIS — R251 Tremor, unspecified: Secondary | ICD-10-CM | POA: Diagnosis present

## 2021-08-28 DIAGNOSIS — E669 Obesity, unspecified: Secondary | ICD-10-CM | POA: Diagnosis present

## 2021-08-28 LAB — GLUCOSE, CAPILLARY
Glucose-Capillary: 354 mg/dL — ABNORMAL HIGH (ref 70–99)
Glucose-Capillary: 114 mg/dL — ABNORMAL HIGH (ref 70–99)
Glucose-Capillary: 90 mg/dL (ref 70–99)
Glucose-Capillary: 128 mg/dL — ABNORMAL HIGH (ref 70–99)

## 2021-08-28 LAB — MAGNESIUM: Magnesium: 1.5 mg/dL — ABNORMAL LOW (ref 1.7–2.4)

## 2021-08-28 LAB — COMPREHENSIVE METABOLIC PANEL
ALT: 24 U/L (ref 0–44)
AST: 16 U/L (ref 15–41)
Albumin: 3.4 g/dL — ABNORMAL LOW (ref 3.5–5.0)
Alkaline Phosphatase: 57 U/L (ref 38–126)
Anion gap: 9 (ref 5–15)
BUN: 38 mg/dL — ABNORMAL HIGH (ref 8–23)
CO2: 24 mmol/L (ref 22–32)
Calcium: 8.1 mg/dL — ABNORMAL LOW (ref 8.9–10.3)
Chloride: 102 mmol/L (ref 98–111)
Creatinine, Ser: 1.83 mg/dL — ABNORMAL HIGH (ref 0.61–1.24)
GFR, Estimated: 36 mL/min — ABNORMAL LOW (ref >60)
Glucose, Bld: 416 mg/dL — ABNORMAL HIGH (ref 70–99)
Potassium: 4.2 mmol/L (ref 3.5–5.1)
Sodium: 135 mmol/L (ref 135–145)
Total Bilirubin: 1.2 mg/dL (ref 0.3–1.2)
Total Protein: 6 g/dL — ABNORMAL LOW (ref 6.5–8.1)

## 2021-08-28 LAB — CBC
HCT: 40.8 % (ref 39.0–52.0)
Hemoglobin: 13.5 g/dL (ref 13.0–17.0)
MCH: 31.6 pg (ref 26.0–34.0)
MCHC: 33.1 g/dL (ref 30.0–36.0)
MCV: 95.6 fL (ref 80.0–100.0)
Platelets: 92 10*3/uL — ABNORMAL LOW (ref 150–400)
RBC: 4.27 MIL/uL (ref 4.22–5.81)
RDW: 17.3 % — ABNORMAL HIGH (ref 11.5–15.5)
WBC: 6.3 10*3/uL (ref 4.0–10.5)
nRBC: 0 % (ref 0.0–0.2)

## 2021-08-28 LAB — PHOSPHORUS: Phosphorus: 2.7 mg/dL (ref 2.5–4.6)

## 2021-08-28 LAB — HEMOGLOBIN A1C
Hgb A1c MFr Bld: 8.6 % — ABNORMAL HIGH (ref 4.8–5.6)
Mean Plasma Glucose: 200.12 mg/dL

## 2021-08-28 LAB — BRAIN NATRIURETIC PEPTIDE: B Natriuretic Peptide: 134 pg/mL — ABNORMAL HIGH (ref 0.0–100.0)

## 2021-08-28 LAB — CBG MONITORING, ED: Glucose-Capillary: 361 mg/dL — ABNORMAL HIGH (ref 70–99)

## 2021-08-28 MED ORDER — GABAPENTIN 300 MG PO CAPS: 300.0000 mg | ORAL_CAPSULE | Freq: Three times a day (TID) | ORAL | Status: DC

## 2021-08-28 MED ORDER — ACETAMINOPHEN 325 MG PO TABS
650.0000 mg | ORAL_TABLET | Freq: Once | ORAL | Status: AC
  Administered 2021-08-28: 650 mg via ORAL

## 2021-08-28 MED ORDER — ACETAMINOPHEN 325 MG PO TABS
650.0000 mg | ORAL_TABLET | Freq: Four times a day (QID) | ORAL | Status: DC | PRN
  Administered 2021-08-28: 650 mg via ORAL
  Filled 2021-08-28: qty 2

## 2021-08-28 MED ORDER — ACETAMINOPHEN 325 MG PO TABS: 650.0000 mg | ORAL_TABLET | Freq: Four times a day (QID) | ORAL | Status: DC | PRN

## 2021-08-28 MED ORDER — SODIUM CHLORIDE 0.9 % IV SOLN: Freq: Once | INTRAVENOUS | Status: AC

## 2021-08-28 MED ORDER — MAGNESIUM SULFATE 2 GM/50ML IV SOLN
2.0000 g | Freq: Once | INTRAVENOUS | Status: AC
  Administered 2021-08-28: 2 g via INTRAVENOUS

## 2021-08-28 MED ORDER — INSULIN ASPART 100 UNIT/ML IJ SOLN
6.0000 [IU] | Freq: Three times a day (TID) | INTRAMUSCULAR | Status: DC
  Administered 2021-08-29 (×2): 6 [IU] via SUBCUTANEOUS

## 2021-08-28 MED ORDER — INSULIN ASPART 100 UNIT/ML IJ SOLN
0.0000 [IU] | Freq: Three times a day (TID) | INTRAMUSCULAR | Status: DC
  Administered 2021-08-28 (×2): 15 [IU] via SUBCUTANEOUS
  Administered 2021-08-29 (×2): 5 [IU] via SUBCUTANEOUS
  Administered 2021-08-29: 3 [IU] via SUBCUTANEOUS
  Administered 2021-08-30 – 2021-08-31 (×4): 5 [IU] via SUBCUTANEOUS
  Filled 2021-08-28: qty 1

## 2021-08-28 MED ORDER — GABAPENTIN 300 MG PO CAPS: 300.0000 mg | ORAL_CAPSULE | Freq: Every day | ORAL | Status: DC

## 2021-08-28 MED ORDER — ALBUTEROL SULFATE HFA 108 (90 BASE) MCG/ACT IN AERS: 2.0000 | INHALATION_SPRAY | Freq: Every evening | RESPIRATORY_TRACT | Status: DC | PRN

## 2021-08-28 MED ORDER — SODIUM CHLORIDE 0.9 % IV SOLN
1.0000 g | INTRAVENOUS | Status: DC
  Administered 2021-08-28 – 2021-08-30 (×3): 1 g via INTRAVENOUS
  Filled 2021-08-28 (×3): qty 10

## 2021-08-28 MED ORDER — MOMETASONE FURO-FORMOTEROL FUM 200-5 MCG/ACT IN AERO
2.0000 | INHALATION_SPRAY | Freq: Two times a day (BID) | RESPIRATORY_TRACT | Status: DC
  Administered 2021-08-28 – 2021-08-30 (×5): 2 via RESPIRATORY_TRACT
  Filled 2021-08-28: qty 8.8

## 2021-08-28 MED ORDER — INSULIN ASPART 100 UNIT/ML IJ SOLN: 0.0000 [IU] | Freq: Three times a day (TID) | INTRAMUSCULAR | Status: DC

## 2021-08-28 MED ORDER — INSULIN ASPART 100 UNIT/ML IJ SOLN: 0.0000 [IU] | Freq: Every day | INTRAMUSCULAR | Status: DC

## 2021-08-28 MED ORDER — TEMAZEPAM 15 MG PO CAPS: 15.0000 mg | ORAL_CAPSULE | Freq: Every evening | ORAL | Status: DC | PRN

## 2021-08-28 MED ORDER — DOCUSATE SODIUM 100 MG PO CAPS
100.0000 mg | ORAL_CAPSULE | Freq: Two times a day (BID) | ORAL | Status: DC
  Administered 2021-08-28 – 2021-08-30 (×4): 100 mg via ORAL
  Filled 2021-08-28 (×6): qty 1

## 2021-08-28 MED ORDER — FUROSEMIDE 10 MG/ML IJ SOLN
20.0000 mg | Freq: Once | INTRAMUSCULAR | Status: AC
  Administered 2021-08-28: 20 mg via INTRAVENOUS
  Filled 2021-08-28: qty 2

## 2021-08-28 MED ORDER — ACETAMINOPHEN 650 MG RE SUPP: 650.0000 mg | Freq: Four times a day (QID) | RECTAL | Status: DC | PRN

## 2021-08-28 MED ORDER — INSULIN GLARGINE-YFGN 100 UNIT/ML ~~LOC~~ SOLN
25.0000 [IU] | Freq: Every day | SUBCUTANEOUS | Status: DC
  Administered 2021-08-29: 25 [IU] via SUBCUTANEOUS
  Filled 2021-08-28 (×4): qty 0.25

## 2021-08-28 MED ORDER — AMLODIPINE BESYLATE 5 MG PO TABS
5.0000 mg | ORAL_TABLET | Freq: Every day | ORAL | Status: DC
  Administered 2021-08-28 – 2021-08-30 (×3): 5 mg via ORAL
  Filled 2021-08-28 (×3): qty 1

## 2021-08-28 MED ORDER — ONDANSETRON HCL 4 MG PO TABS: 4.0000 mg | ORAL_TABLET | Freq: Four times a day (QID) | ORAL | Status: DC | PRN

## 2021-08-28 MED ORDER — ONDANSETRON HCL 4 MG/2ML IJ SOLN
4.0000 mg | Freq: Four times a day (QID) | INTRAMUSCULAR | Status: DC | PRN
  Administered 2021-08-28 – 2021-08-30 (×2): 4 mg via INTRAVENOUS
  Filled 2021-08-28 (×2): qty 2

## 2021-08-28 MED ORDER — ORAL CARE MOUTH RINSE
15.0000 mL | Freq: Two times a day (BID) | OROMUCOSAL | Status: DC
  Administered 2021-08-29 – 2021-08-30 (×2): 15 mL via OROMUCOSAL

## 2021-08-28 MED ORDER — PNEUMOCOCCAL VAC POLYVALENT 25 MCG/0.5ML IJ INJ
0.5000 mL | INJECTION | INTRAMUSCULAR | Status: AC
  Administered 2021-08-29: 0.5 mL via INTRAMUSCULAR
  Filled 2021-08-28: qty 0.5

## 2021-08-28 MED ORDER — GABAPENTIN 300 MG PO CAPS: 600.0000 mg | ORAL_CAPSULE | Freq: Every evening | ORAL | Status: DC

## 2021-08-28 MED ORDER — INSULIN GLARGINE-YFGN 100 UNIT/ML ~~LOC~~ SOLN: 10.0000 [IU] | Freq: Every day | SUBCUTANEOUS | Status: DC

## 2021-08-28 MED ORDER — FLUTICASONE PROPIONATE 50 MCG/ACT NA SUSP: 1.0000 | NASAL | Status: DC | PRN

## 2021-08-28 NOTE — Assessment & Plan Note (Addendum)
Asymptomatic. Continue albuterol.

## 2021-08-28 NOTE — Progress Notes (Signed)
Updated pt's wife Jazon Jipson [phone]) and pt's daughter, Dani Gobble [phone]), via phone on pt's condition and plan of care.

## 2021-08-28 NOTE — Assessment & Plan Note (Addendum)
Hemoglobin A1C of 8.6%. Patient is on Humalog sliding scale and glipizide as an outpatient. He is also prescribed Toujeo 25 units daily, but is taking 80 units daily. Patient started on Semglee 25 units daily (starting 12/7) and SSI inpatient. Resume  Home prescribed dose.

## 2021-08-28 NOTE — Assessment & Plan Note (Addendum)
Present on admission. Started empirically on Ceftriaxone. Urine culture significant for E. Coli with sensitivities pending. CT renal with evidence of possible pyelonephritis. Associated gross hematuria. Hematuria has resolved. Patient transitioned to Cefazolin IV and finally to Cefadroxil PO for discharge to complete 10 days of treatment.

## 2021-08-28 NOTE — ED Notes (Signed)
Pt's brief wet upon assessment. Pt took off soiled brief and applied new one.

## 2021-08-28 NOTE — Assessment & Plan Note (Addendum)
Gabapentin held secondary to renal function. Resume on discharge.

## 2021-08-28 NOTE — Assessment & Plan Note (Addendum)
Most recent creatinine baseline of 1.3 from September 22. Creatinine of 2.06 on admission secondary to hydronephrosis. Initial improvement; now slightly worsened. Patient with associated nausea and decreased oral intake requiring initiation of IV fluids. Creatinine improved with IV fluids and patient now back to baseline.

## 2021-08-28 NOTE — Assessment & Plan Note (Signed)
Prior history.

## 2021-08-28 NOTE — Assessment & Plan Note (Addendum)
Patient with a chronic component, but with an acute component in setting of infection. No hemorrhaging. Improved and is stable.

## 2021-08-28 NOTE — Progress Notes (Signed)
Pt refused his Semglee insulin. It was due at 10am but not given in ED because it was not available. Med was delivered here to 300 unit at 1430. Pt is refusing to take it because, "it's too late in the day. I take it in the morning. If I take it now my blood sugar will drop too low." Attempted to educate pt on purpose of long-acting insulin and that we will be monitoring his bloodsugars regularly but pt is adamant that he is not taking it now. MD Posey Pronto notified.

## 2021-08-28 NOTE — Assessment & Plan Note (Addendum)
Continue Protonix °

## 2021-08-28 NOTE — ED Notes (Signed)
US at bedside

## 2021-08-28 NOTE — Assessment & Plan Note (Addendum)
Body mass index is 30.15 kg/m.

## 2021-08-28 NOTE — Progress Notes (Signed)
Pt lying in fetal position in bed, moaning, "oh me!" When questioned, pt states, "I am sick honey, I'm in bad shape." Pt will not give any specific complaint. When asked if he was nauseated, pt stated, "Yea, I guess a little bit." Vital signs checked, pt wih oral temp of 100.4. Face flushed. Pt with cough, non-productive. Denies any SOB or pain. Pt with repetitive conversation, which is different from earlier this shift. Pt oriented x4 but restless and anxious. Advised will administer Tylenol for fever, pt agreeable.

## 2021-08-28 NOTE — Assessment & Plan Note (Addendum)
Patient is on propranolol as an outpatient which was held on admission. Resume on discharge.

## 2021-08-28 NOTE — Assessment & Plan Note (Addendum)
Magnesium replaced.

## 2021-08-28 NOTE — Progress Notes (Signed)
[facility] Progress Note  Patient: [name]    DGL:[id number]  DOA: 08/27/2021    Date of Service: the patient was seen and examined on 08/28/2021  Brief hospital course: No notes on file  Assessment and Plan: * Sepsis secondary to UTI [facility]) Presents with complaints of inability to urinate.  Has history of urethral stricture and stress incontinence and follows up with urology had a Foley catheter which was removed in August. Found to have sepsis secondary to UTI, nitrate positive. Ultrasound renal positive left-sided hydronephrosis. Continue with IV hydration.  Get CT renal to rule out any [place]. Continue with IV antibiotics.  Follow-up on cultures. [place] urology consultation as well.  Thrombocytopenia, unspecified (HCC) Platelet counts are low and appears to be acute in the setting of sepsis.  Monitor.  Hypomagnesemia Replaced.  Monitor.  Acute renal failure superimposed on stage 3b chronic kidney disease ([place]) Renal function worsening.  Likely obstructive uropathy. Continue with IV hydration.  Type 2 diabetes mellitus with chronic kidney disease, with long-term current use of insulin ([place]) Patient is on insulin chronically.  Hemoglobin A1c was also not well controlled. Currently we will continue with insulin at a lower dose with sliding scale and Lantus 25 units.  Monitor.  Obesity (BMI 30.0-34.9) Placing the patient at high risk of poor outcome. Also has sleep apnea.  Asthma Controlled.  Monitor.  Tremors of nervous system Prior history of essential tremors.  On Inderal.  Currently holding.  Peripheral polyneuropathy Continue gabapentin.  GERD (gastroesophageal reflux disease) Continue PPI.  Mantle cell lymphoma ([place]) Prior history.    Body mass index is 30.15 kg/m.        Subjective: Continue to have fatigue and tiredness.  Mentation improved.  No nausea no vomiting.  No acute complaints.  Objective: Heart rate and hypotension  improving.  Exam: General: Appear in mild distress, no Rash; Oral Mucosa Clear, moist. no Abnormal Neck Mass Or lumps, Conjunctiva normal  Cardiovascular: S1 and S2 Present, no Murmur, Respiratory: good respiratory effort, Bilateral Air entry present and CTA, no Crackles, no wheezes Abdomen: Bowel Sound present, Soft and no tenderness Extremities: no Pedal edema Neurology: alert and oriented to time, place, and person affect appropriate. no new focal deficit Gait not checked due to patient safety concerns    Data Reviewed: My review of labs, imaging, notes and other tests is significant for    worsening renal function, worsening platelets, improving WBC, ultrasound renal positive for hydronephrosis on the left.  Disposition:  Status is: Inpatient  Remains inpatient appropriate because: Ongoing treatment for sepsis and further work-up for obstruction  Family Communication: None at bedside.  DVT Prophylaxis: SCDs Start: 08/28/21 0219   Time spent: 35 minutes.   Author: Berle Mull  08/28/2021 12:26 PM  To reach On-call, see care teams to locate the attending and reach out via [email or web address]. Between 7PM-7AM, please contact night-coverage If you still have difficulty reaching the attending provider, please page the [facility] (Director on Call) for [facility] on amion for assistance.

## 2021-08-28 NOTE — ED Notes (Signed)
Pt OOB to use urinal without difficulties. Stated that he might try to eat more of his breakfast later.

## 2021-08-29 DIAGNOSIS — N1330 Unspecified hydronephrosis: Secondary | ICD-10-CM

## 2021-08-29 DIAGNOSIS — E1122 Type 2 diabetes mellitus with diabetic chronic kidney disease: Secondary | ICD-10-CM

## 2021-08-29 DIAGNOSIS — Z794 Long term (current) use of insulin: Secondary | ICD-10-CM

## 2021-08-29 DIAGNOSIS — A419 Sepsis, unspecified organism: Secondary | ICD-10-CM

## 2021-08-29 DIAGNOSIS — R339 Retention of urine, unspecified: Secondary | ICD-10-CM

## 2021-08-29 LAB — COMPREHENSIVE METABOLIC PANEL
ALT: 23 U/L (ref 0–44)
AST: 16 U/L (ref 15–41)
Albumin: 3.4 g/dL — ABNORMAL LOW (ref 3.5–5.0)
Alkaline Phosphatase: 58 U/L (ref 38–126)
Anion gap: 12 (ref 5–15)
BUN: 35 mg/dL — ABNORMAL HIGH (ref 8–23)
CO2: 24 mmol/L (ref 22–32)
Calcium: 8.5 mg/dL — ABNORMAL LOW (ref 8.9–10.3)
Chloride: 102 mmol/L (ref 98–111)
Creatinine, Ser: 1.54 mg/dL — ABNORMAL HIGH (ref 0.61–1.24)
GFR, Estimated: 44 mL/min — ABNORMAL LOW (ref >60)
Glucose, Bld: 166 mg/dL — ABNORMAL HIGH (ref 70–99)
Potassium: 4 mmol/L (ref 3.5–5.1)
Sodium: 138 mmol/L (ref 135–145)
Total Bilirubin: 1.3 mg/dL — ABNORMAL HIGH (ref 0.3–1.2)
Total Protein: 6.3 g/dL — ABNORMAL LOW (ref 6.5–8.1)

## 2021-08-29 LAB — CBC WITH DIFFERENTIAL/PLATELET
Abs Immature Granulocytes: 0.03 10*3/uL (ref 0.00–0.07)
Basophils Absolute: 0 10*3/uL (ref 0.0–0.1)
Basophils Relative: 1 %
Eosinophils Absolute: 0.1 10*3/uL (ref 0.0–0.5)
Eosinophils Relative: 1 %
HCT: 42.8 % (ref 39.0–52.0)
Hemoglobin: 13.8 g/dL (ref 13.0–17.0)
Immature Granulocytes: 1 %
Lymphocytes Relative: 12 %
Lymphs Abs: 0.6 10*3/uL — ABNORMAL LOW (ref 0.7–4.0)
MCH: 31.4 pg (ref 26.0–34.0)
MCHC: 32.2 g/dL (ref 30.0–36.0)
MCV: 97.3 fL (ref 80.0–100.0)
Monocytes Absolute: 0.6 10*3/uL (ref 0.1–1.0)
Monocytes Relative: 12 %
Neutro Abs: 3.8 10*3/uL (ref 1.7–7.7)
Neutrophils Relative %: 73 %
Platelets: 96 10*3/uL — ABNORMAL LOW (ref 150–400)
RBC: 4.4 MIL/uL (ref 4.22–5.81)
RDW: 17.2 % — ABNORMAL HIGH (ref 11.5–15.5)
WBC: 5.1 10*3/uL (ref 4.0–10.5)
nRBC: 0 % (ref 0.0–0.2)

## 2021-08-29 LAB — GLUCOSE, CAPILLARY
Glucose-Capillary: 175 mg/dL — ABNORMAL HIGH (ref 70–99)
Glucose-Capillary: 254 mg/dL — ABNORMAL HIGH (ref 70–99)
Glucose-Capillary: 235 mg/dL — ABNORMAL HIGH (ref 70–99)
Glucose-Capillary: 268 mg/dL — ABNORMAL HIGH (ref 70–99)
Glucose-Capillary: 228 mg/dL — ABNORMAL HIGH (ref 70–99)

## 2021-08-29 LAB — MAGNESIUM: Magnesium: 2 mg/dL (ref 1.7–2.4)

## 2021-08-29 MED ORDER — CHLORHEXIDINE GLUCONATE CLOTH 2 % EX PADS
6.0000 | MEDICATED_PAD | Freq: Every day | CUTANEOUS | Status: DC
  Administered 2021-08-29 – 2021-08-30 (×2): 6 via TOPICAL

## 2021-08-29 MED ORDER — PANTOPRAZOLE SODIUM 40 MG PO TBEC
40.0000 mg | DELAYED_RELEASE_TABLET | Freq: Every day | ORAL | Status: DC
  Administered 2021-08-29 – 2021-08-30 (×2): 40 mg via ORAL
  Filled 2021-08-29 (×2): qty 1

## 2021-08-29 NOTE — Evaluation (Signed)
Physical Therapy Evaluation Patient Details Name: [name] MRN: [id number] DOB: 07-30-1935 Today's Date: 08/29/2021  History of Present Illness  [name] is a 86 y.o. male with medical history significant for hyperlipidemia, type 2 diabetes mellitus, hypertension, chronic kidney disease, CAD, B12 deficiency, mantle cell lymphoma of the colon, GERD, PVD, prostate CA, OSA not on CPAP, s/p COVID infection and treatment who presents to the emergency department via EMS due to generalized body aches which started few hours PTA.  Patient was unable to provide history due to altered mental status.  History was obtained from ED physician and ED medical record.  Per report, patient was not feeling well and endorsed same sensation during his last UTI.  EMS was activated and family states that patient has been having recurrent UTIs.  It was noted that patient was admitted at [facility] from 9/12-9/20 due to severe sepsis secondary to E. coli bacteremia.   Clinical Impression  Patient functioning near baseline for functional mobility and gait other than required standing for a few minutes before ambulating from bedside, no loss of balance and limited mostly due to fatigue.  Patient at times easily agitated possibly due to mild confusion.  Patient tolerated sitting up in chair after therapy - nursing staff aware.  Patient will benefit from continued skilled physical therapy in hospital and recommended venue below to increase strength, balance, endurance for safe ADLs and gait.         Recommendations for follow up therapy are one component of a multi-disciplinary discharge planning process, led by the attending physician.  Recommendations may be updated based on patient status, additional functional criteria and insurance authorization.  Follow Up Recommendations Home health PT    Assistance Recommended at Discharge Intermittent Supervision/Assistance  Functional Status Assessment Patient has  had a recent decline in their functional status and demonstrates the ability to make significant improvements in function in a reasonable and predictable amount of time.  Equipment Recommendations  None recommended by PT    Recommendations for Other Services       Precautions / Restrictions Precautions Precautions: Fall Restrictions Weight Bearing Restrictions: No      Mobility  Bed Mobility Overal bed mobility: Modified Independent                  Transfers Overall transfer level: Modified independent                      Ambulation/Gait Ambulation/Gait assistance: Supervision Gait Distance (Feet): 85 Feet Assistive device: None Gait Pattern/deviations: Decreased step length - right;Decreased step length - left;Decreased stride length;Wide base of support Gait velocity: decreased     General Gait Details: slightly labored cadence with wide base of support, no loss of balance, limited mostly due to fatigue  Stairs            Wheelchair Mobility    Modified Rankin (Stroke Patients Only)       Balance Overall balance assessment: No apparent balance deficits (not formally assessed)                                           Pertinent Vitals/Pain Pain Assessment: No/denies pain    Home Living Family/patient expects to be discharged to:: Private residence Living Arrangements: Spouse/significant other Available Help at Discharge: Family;Available 24 hours/day Type of Home: House Home Access: Level entry  Home Layout: One level Home Equipment: Conservation officer, nature (2 wheels);Cane - single point      Prior Function Prior Level of Function : Independent/Modified Independent             Mobility Comments: Hydrographic surveyor, drives, "per patient" ADLs Comments: Independent, "per patient"     Hand Dominance        Extremity/Trunk Assessment   Upper Extremity Assessment Upper Extremity Assessment: Defer to OT  evaluation    Lower Extremity Assessment Lower Extremity Assessment: Overall WFL for tasks assessed    Cervical / Trunk Assessment Cervical / Trunk Assessment: Normal  Communication   Communication: No difficulties  Cognition Arousal/Alertness: Awake/alert Behavior During Therapy: WFL for tasks assessed/performed;Agitated Overall Cognitive Status: Within Functional Limits for tasks assessed                                 General Comments: easily agitated, but overall cooperative        General Comments      Exercises     Assessment/Plan    PT Assessment Patient needs continued PT services  PT Problem List Decreased strength;Decreased activity tolerance;Decreased balance;Decreased mobility       PT Treatment Interventions DME instruction;Gait training;Stair training;Functional mobility training;Therapeutic activities;Therapeutic exercise;Balance training;Patient/family education    PT Goals (Current goals can be found in the Care Plan section)  Acute Rehab PT Goals Patient Stated Goal: return home with family to assist PT Goal Formulation: With patient Time For Goal Achievement: 09/01/21 Potential to Achieve Goals: Good    Frequency Min 2X/week   Barriers to discharge        Co-evaluation               AM-PAC PT "6 Clicks" Mobility  Outcome Measure Help needed turning from your back to your side while in a flat bed without using bedrails?: None Help needed moving from lying on your back to sitting on the side of a flat bed without using bedrails?: None Help needed moving to and from a bed to a chair (including a wheelchair)?: None Help needed standing up from a chair using your arms (e.g., wheelchair or bedside chair)?: None Help needed to walk in hospital room?: A Little Help needed climbing 3-5 steps with a railing? : A Little 6 Click Score: 22    End of Session   Activity Tolerance: Patient tolerated treatment well;Patient limited  by fatigue Patient left: in chair;with call bell/phone within reach Nurse Communication: Mobility status PT Visit Diagnosis: Unsteadiness on feet (R26.81);Other abnormalities of gait and mobility (R26.89);Muscle weakness (generalized) (M62.81)    Time: 9147-8295 PT Time Calculation (min) (ACUTE ONLY): 29 min   Charges:   PT Evaluation $PT Eval Moderate Complexity: 1 Mod PT Treatments $Therapeutic Activity: 23-37 mins        3:51 PM, 08/29/21 Lonell Grandchild, MPT Physical Therapist with [facility] 336 [phone] office [phone] mobile phone

## 2021-08-29 NOTE — Plan of Care (Signed)
  Problem: Acute Rehab PT Goals(only PT should resolve) Goal: Pt Will Go Supine/Side To Sit Outcome: Progressing Flowsheets (Taken 08/29/2021 1552) Pt will go Supine/Side to Sit:  Independently  with modified independence Goal: Patient Will Transfer Sit To/From Stand Outcome: Progressing Flowsheets (Taken 08/29/2021 1552) Patient will transfer sit to/from stand:  with modified independence  Independently Goal: Pt Will Transfer Bed To Chair/Chair To Bed Outcome: Progressing Flowsheets (Taken 08/29/2021 1552) Pt will Transfer Bed to Chair/Chair to Bed:  with modified independence  Independently Goal: Pt Will Ambulate Outcome: Progressing Flowsheets (Taken 08/29/2021 1552) Pt will Ambulate:  > 125 feet  with modified independence  with least restrictive assistive device   3:52 PM, 08/29/21 Lonell Grandchild, MPT Physical Therapist with [facility] 336 [phone] office [phone] mobile phone

## 2021-08-29 NOTE — Consult Note (Signed)
Urology Consult  Referring physician: Dr. Lonny Prude Reason for referral: urinary retention and left hydronephrosis  Chief Complaint: suprapubic pain  History of Present Illness: [name] is a 86yo with a histroy of DMII, PVD, Prostate cancer s/p RRP and salvage radiation who presented to the ER with a 3 day history of difficulty urinating and fevers. He was found to have sepsis likely from a urinary source and was admitted. He was last seen by his urologist, Dr. Odis Luster, at [facility] 6 weeks ago. At that time he was urinating well. He has a history of an infected IPP and AUS and underwent explant of both devices 06/09/2021. At that time he was found to have a urethral erosion and a foley was placed. His foley was then remover 3 week later. For the past 3 days he has noticed decreased urine output, straining to urinate, a weak urinary stream and dysuria. He underwent renal US yesterday which showed a distended bladder and new left hydronephrosis. CT Glaab study revealed a distended bladder and mild left hydronephrosis to the UVJ. Currently he complaints of dull constant, moderate nonraditing suprapubic pain. No other associated symptoms. No exacerbating/alleviating events.   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, anomalous circumflex - no PCIs   Essential hypertension, benign    GERD (gastroesophageal reflux disease)    Mixed hyperlipidemia    Obstructive sleep apnea    does not use   Prostate cancer ([place])    PVD (peripheral vascular disease) ([place])    Type 2 diabetes mellitus ([place])    Past Surgical History:  Procedure Laterality Date   BACK SURGERY     BALLOON DILATION N/A 08/27/2013   Procedure: BALLOON DILATION;  Surgeon: Rogene Houston, MD;  [facility];  Service: Endoscopy;  Laterality: N/A;   COLON SURGERY     COLONOSCOPY N/A  12/17/2013   Procedure: COLONOSCOPY;  Surgeon: Rogene Houston, MD;  [facility];  Service: Endoscopy;  Laterality: N/A;  940   COLONOSCOPY N/A 02/28/2014   Procedure: COLONOSCOPY;  Surgeon: Rogene Houston, MD;  [facility];  Service: Endoscopy;  Laterality: N/A;  730   COLONOSCOPY N/A 08/04/2015   Procedure: COLONOSCOPY;  Surgeon: Rogene Houston, MD;  [facility];  Service: Endoscopy;  Laterality: N/A;  200 - moved to 11/11 @ 2:10 - Ann to notify pt   COLONOSCOPY WITH ESOPHAGOGASTRODUODENOSCOPY (EGD) N/A 08/27/2013   Procedure: COLONOSCOPY WITH ESOPHAGOGASTRODUODENOSCOPY (EGD);  Surgeon: Rogene Houston, MD;  [facility];  Service: Endoscopy;  Laterality: N/A;  730   CYSTOSCOPY N/A 06/09/2021   Procedure: CYSTOSCOPY;  Surgeon: Lucas Mallow, MD;  Location: [facility];  Service: Urology;  Laterality: N/A;   IR REMOVAL TUN ACCESS W/ PORT W/O FL MOD SED  07/17/2018   KNEE ARTHROSCOPY Left    KNEE SURGERY Right    total knee   MALONEY DILATION N/A 08/27/2013   Procedure: MALONEY DILATION;  Surgeon: Rogene Houston, MD;  [facility];  Service: Endoscopy;  Laterality: N/A;   PORTACATH PLACEMENT Right 09/23/2012   PROSTATECTOMY     REMOVAL OF PENILE PROSTHESIS N/A 06/09/2021   Procedure: REMOVAL OF ARTIFICIAL URINARY SPHINCER; REMOVAL OF INFLATABLE PENILE PROSTHESIS;  Surgeon: Lucas Mallow, MD;  Location: [facility];  Service: Urology;  Laterality: N/A;   removal of port Right    SAVORY DILATION N/A 08/27/2013   Procedure: SAVORY DILATION;  Surgeon: Rogene Houston, MD;  [facility];  Service: Endoscopy;  Laterality: N/A;   SHOULDER SURGERY     TONSILLECTOMY     URINARY SPHINCTER IMPLANT N/A 01/23/2017   artificial urinary sphincter implant by Dr. Odis Luster   VASECTOMY      Medications: I have reviewed the patient's current medications. Allergies:  Allergies  Allergen Reactions   Bee Venom Anaphylaxis   Adhesive  [Tape] Hives   Ciprofloxacin     Unknown    Codeine     Unknown     Iodine     Unknown     Latex     Unknown     Povidone-Iodine     Unknown     Simvastatin     Unknown      Family History  Problem Relation Age of Onset   Colon cancer Brother    Cancer Brother    Diabetes Father    Cancer Brother    Social History:  reports that he has never smoked. He has never used smokeless tobacco. He reports that he does not drink alcohol and does not use drugs.  Review of Systems  Genitourinary:  Positive for difficulty urinating, dysuria and urgency.  All other systems reviewed and are negative.  Physical Exam:  Vital signs in last 24 hours: Temp:  [97.5 F (36.4 C)-100.4 F (38 C)] 98.3 F (36.8 C) (12/07 0514) Pulse Rate:  [72-96] 95 (12/07 0514) Resp:  [16-20] 16 (12/07 0514) BP: (105-155)/(73-91) 155/91 (12/07 0514) SpO2:  [87 %-97 %] 90 % (12/07 6962) Physical Exam Vitals reviewed.  Constitutional:      Appearance: Normal appearance.  HENT:     Head: Normocephalic and atraumatic.     Mouth/Throat:     Mouth: Mucous membranes are dry.  Eyes:     Extraocular Movements: Extraocular movements intact.     Pupils: Pupils are equal, round, and reactive to light.  Cardiovascular:     Rate and Rhythm: Normal rate and regular rhythm.  Pulmonary:     Effort: Pulmonary effort is normal. No respiratory distress.  Abdominal:     General: Abdomen is flat.     Tenderness: There is abdominal tenderness.  Genitourinary:    Penis: Normal and uncircumcised.      Testes: Normal.     Epididymis:     Right: Normal.     Left: Normal.  Musculoskeletal:        General: No swelling. Normal range of motion.     Cervical back: Normal range of motion and neck supple.  Skin:    General: Skin is warm and dry.  Neurological:     General: No focal deficit present.     Mental Status: He is alert and oriented to person, place, and time.  Psychiatric:        Mood and Affect: Mood  normal.        Behavior: Behavior normal.        Thought Content: Thought content normal.        Judgment: Judgment normal.    Laboratory Data:  Results for orders placed or performed during the hospital encounter of 08/27/21 (from the past 72 hour(s))  CBG monitoring, ED     Status: Abnormal   Collection Time: 08/27/21  6:19 PM  Result Value Ref Range   Glucose-Capillary  351 (H) 70 - 99 mg/dL    Comment: Glucose reference range applies only to samples taken after fasting for at least 8 hours.  Blood culture (routine x 2)     Status: None (Preliminary result)   Collection Time: 08/27/21  6:33 PM   Specimen: BLOOD LEFT FOREARM  Result Value Ref Range   Specimen Description BLOOD LEFT FOREARM    Special Requests      BOTTLES DRAWN AEROBIC AND ANAEROBIC Blood Culture adequate volume   Culture      NO GROWTH 2 DAYS Performed at [facility], [address] [place], [place] [address]    Report Status PENDING   Blood culture (routine x 2)     Status: None (Preliminary result)   Collection Time: 08/27/21  6:33 PM   Specimen: Left Antecubital; Blood  Result Value Ref Range   Specimen Description LEFT ANTECUBITAL    Special Requests      BOTTLES DRAWN AEROBIC AND ANAEROBIC Blood Culture adequate volume   Culture      NO GROWTH 2 DAYS Performed at [facility], [address], [place], [place] [address]    Report Status PENDING   Lactic acid, plasma     Status: None   Collection Time: 08/27/21  6:36 PM  Result Value Ref Range   Lactic Acid, Venous 1.5 0.5 - 1.9 mmol/L    Comment: Performed at [facility], [address], [place], [place] [address]  Comprehensive metabolic panel     Status: Abnormal   Collection Time: 08/27/21  6:36 PM  Result Value Ref Range   Sodium 134 (L) 135 - 145 mmol/L   Potassium 5.0 3.5 - 5.1 mmol/L   Chloride 97 (L) 98 - 111 mmol/L   CO2 28 22 - 32 mmol/L   Glucose, Bld 407 (H) 70 - 99 mg/dL    Comment: Glucose reference range applies only to samples  taken after fasting for at least 8 hours.   BUN 37 (H) 8 - 23 mg/dL   Creatinine, Ser 2.06 (H) 0.61 - 1.24 mg/dL   Calcium 8.8 (L) 8.9 - 10.3 mg/dL   Total Protein 7.0 6.5 - 8.1 g/dL   Albumin 3.9 3.5 - 5.0 g/dL   AST 20 15 - 41 U/L   ALT 29 0 - 44 U/L   Alkaline Phosphatase 67 38 - 126 U/L   Total Bilirubin 1.2 0.3 - 1.2 mg/dL   GFR, Estimated 31 (L) >60 mL/min    Comment: (NOTE) Calculated using the CKD-EPI Creatinine Equation (2021)    Anion gap 9 5 - 15    Comment: Performed at [facility], [address] [place], [place] [address]  CBC with Differential     Status: Abnormal   Collection Time: 08/27/21  6:36 PM  Result Value Ref Range   WBC 4.5 4.0 - 10.5 K/uL   RBC 4.66 4.22 - 5.81 MIL/uL   Hemoglobin 14.5 13.0 - 17.0 g/dL   HCT 44.9 39.0 - 52.0 %   MCV 96.4 80.0 - 100.0 fL   MCH 31.1 26.0 - 34.0 pg   MCHC 32.3 30.0 - 36.0 g/dL   RDW 17.6 (H) 11.5 - 15.5 %   Platelets 98 (L) 150 - 400 K/uL    Comment: SPECIMEN CHECKED FOR CLOTS Immature Platelet Fraction may be clinically indicated, consider ordering this additional test [id number] REPEATED TO VERIFY PLATELETS APPEAR DECREASED    nRBC 0.0 0.0 - 0.2 %   Neutrophils Relative % 81 %  Neutro Abs 3.5 1.7 - 7.7 K/uL   Lymphocytes Relative 11 %   Lymphs Abs 0.5 (L) 0.7 - 4.0 K/uL   Monocytes Relative 7 %   Monocytes Absolute 0.3 0.1 - 1.0 K/uL   Eosinophils Relative 0 %   Eosinophils Absolute 0.0 0.0 - 0.5 K/uL   Basophils Relative 0 %   Basophils Absolute 0.0 0.0 - 0.1 K/uL   WBC Morphology MORPHOLOGY UNREMARKABLE    RBC Morphology MORPHOLOGY UNREMARKABLE    Immature Granulocytes 1 %   Abs Immature Granulocytes 0.05 0.00 - 0.07 K/uL    Comment: Performed at [facility], [address], [place], [place] [address]  Resp Panel by RT-PCR (Flu A&B, Covid) Nasopharyngeal Swab     Status: None   Collection Time: 08/27/21  6:38 PM   Specimen: Nasopharyngeal Swab; Nasopharyngeal(NP) swabs in vial transport medium  Result  Value Ref Range   SARS Coronavirus 2 by RT PCR NEGATIVE NEGATIVE    Comment: (NOTE) SARS-CoV-2 target nucleic acids are NOT DETECTED.  The SARS-CoV-2 RNA is generally detectable in upper respiratory specimens during the acute phase of infection. The lowest concentration of SARS-CoV-2 viral copies this assay can detect is 138 copies/mL. A negative result does not preclude SARS-Cov-2 infection and should not be used as the sole basis for treatment or other patient management decisions. A negative result may occur with  improper specimen collection/handling, submission of specimen other than nasopharyngeal swab, presence of viral mutation(s) within the areas targeted by this assay, and inadequate number of viral copies(<138 copies/mL). A negative result must be combined with clinical observations, patient history, and epidemiological information. The expected result is Negative.  Fact Sheet for Patients:  EntrepreneurPulse.com.au  Fact Sheet for Healthcare Providers:  IncredibleEmployment.be  This test is no t yet approved or cleared by the [place] FDA and  has been authorized for detection and/or diagnosis of SARS-CoV-2 by FDA under an Emergency Use Authorization (EUA). This EUA will remain  in effect (meaning this test can be used) for the duration of the COVID-19 declaration under Section 564(b)(1) of the Act, 21 U.S.C.section 360bbb-3(b)(1), unless the authorization is terminated  or revoked sooner.       Influenza A by PCR NEGATIVE NEGATIVE   Influenza B by PCR NEGATIVE NEGATIVE    Comment: (NOTE) The Xpert Xpress SARS-CoV-2/FLU/RSV plus assay is intended as an aid in the diagnosis of influenza from Nasopharyngeal swab specimens and should not be used as a sole basis for treatment. Nasal washings and aspirates are unacceptable for Xpert Xpress SARS-CoV-2/FLU/RSV testing.  Fact Sheet for  Patients: EntrepreneurPulse.com.au  Fact Sheet for Healthcare Providers: IncredibleEmployment.be  This test is not yet approved or cleared by the [place] FDA and has been authorized for detection and/or diagnosis of SARS-CoV-2 by FDA under an Emergency Use Authorization (EUA). This EUA will remain in effect (meaning this test can be used) for the duration of the COVID-19 declaration under Section 564(b)(1) of the Act, 21 U.S.C. section 360bbb-3(b)(1), unless the authorization is terminated or revoked.  Performed at [facility], [address], [place], [place] [address]   Urinalysis, Routine w reflex microscopic Urine, Clean Catch     Status: Abnormal   Collection Time: 08/27/21  8:17 PM  Result Value Ref Range   Color, Urine YELLOW YELLOW   APPearance HAZY (A) CLEAR   Specific Gravity, Urine 1.020 1.005 - 1.030   pH 5.5 5.0 - 8.0   Glucose, UA >=500 (A) NEGATIVE mg/dL  Hgb urine dipstick LARGE (A) NEGATIVE   Bilirubin Urine NEGATIVE NEGATIVE   Ketones, ur NEGATIVE NEGATIVE mg/dL   Protein, ur 30 (A) NEGATIVE mg/dL   Nitrite POSITIVE (A) NEGATIVE   Leukocytes,Ua SMALL (A) NEGATIVE    Comment: Performed at [facility] [place], [address], [place], [place] [address]  Urinalysis, Microscopic (reflex)     Status: Abnormal   Collection Time: 08/27/21  8:17 PM  Result Value Ref Range   RBC / HPF 21-50 0 - 5 RBC/hpf   WBC, UA >50 0 - 5 WBC/hpf   Bacteria, UA MANY (A) NONE SEEN   Squamous Epithelial / LPF 0-5 0 - 5    Comment: Performed at [facility], [address] [place], [place] [address]  Urine Culture     Status: Abnormal (Preliminary result)   Collection Time: 08/28/21  2:14 AM   Specimen: Urine, Clean Catch  Result Value Ref Range   Specimen Description      URINE, CLEAN CATCH Performed at [facility], [address] [place], [place] [address]    Special Requests      NONE Performed at [facility], [address]  [place], [place] [address]    Culture (A)     >=100,000 COLONIES/mL ESCHERICHIA COLI SUSCEPTIBILITIES TO FOLLOW Performed at [facility] [address] [place], [place] [address]    Report Status PENDING   Comprehensive metabolic panel     Status: Abnormal   Collection Time: 08/28/21  4:30 AM  Result Value Ref Range   Sodium 135 135 - 145 mmol/L   Potassium 4.2 3.5 - 5.1 mmol/L   Chloride 102 98 - 111 mmol/L   CO2 24 22 - 32 mmol/L   Glucose, Bld 416 (H) 70 - 99 mg/dL    Comment: Glucose reference range applies only to samples taken after fasting for at least 8 hours.   BUN 38 (H) 8 - 23 mg/dL   Creatinine, Ser 1.83 (H) 0.61 - 1.24 mg/dL   Calcium 8.1 (L) 8.9 - 10.3 mg/dL   Total Protein 6.0 (L) 6.5 - 8.1 g/dL   Albumin 3.4 (L) 3.5 - 5.0 g/dL   AST 16 15 - 41 U/L   ALT 24 0 - 44 U/L   Alkaline Phosphatase 57 38 - 126 U/L   Total Bilirubin 1.2 0.3 - 1.2 mg/dL   GFR, Estimated 36 (L) >60 mL/min    Comment: (NOTE) Calculated using the CKD-EPI Creatinine Equation (2021)    Anion gap 9 5 - 15    Comment: Performed at [facility], [address], [place], [place] [address]  CBC     Status: Abnormal   Collection Time: 08/28/21  4:30 AM  Result Value Ref Range   WBC 6.3 4.0 - 10.5 K/uL   RBC 4.27 4.22 - 5.81 MIL/uL   Hemoglobin 13.5 13.0 - 17.0 g/dL   HCT 40.8 39.0 - 52.0 %   MCV 95.6 80.0 - 100.0 fL   MCH 31.6 26.0 - 34.0 pg   MCHC 33.1 30.0 - 36.0 g/dL   RDW 17.3 (H) 11.5 - 15.5 %   Platelets 92 (L) 150 - 400 K/uL    Comment: SPECIMEN CHECKED FOR CLOTS Immature Platelet Fraction may be clinically indicated, consider ordering this additional test [id number] CONSISTENT WITH PREVIOUS RESULT    nRBC 0.0 0.0 - 0.2 %    Comment: Performed at [facility], [address] [place], [place] [address]  Magnesium  Status: Abnormal   Collection Time: 08/28/21  4:30 AM  Result Value Ref Range   Magnesium 1.5 (L) 1.7 - 2.4 mg/dL    Comment: Performed at [facility],  [address], [place], [place] [address]  Phosphorus     Status: None   Collection Time: 08/28/21  4:30 AM  Result Value Ref Range   Phosphorus 2.7 2.5 - 4.6 mg/dL    Comment: Performed at [facility], [address], [place], [place] [address]  Hemoglobin A1c     Status: Abnormal   Collection Time: 08/28/21  4:30 AM  Result Value Ref Range   Hgb A1c MFr Bld 8.6 (H) 4.8 - 5.6 %    Comment: (NOTE) Pre diabetes:          5.7%-6.4%  Diabetes:              >6.4%  Glycemic control for   <7.0% adults with diabetes    Mean Plasma Glucose 200.12 mg/dL    Comment: Performed at [facility] [address] [place], [place] [address]  Brain natriuretic peptide     Status: Abnormal   Collection Time: 08/28/21  4:30 AM  Result Value Ref Range   B Natriuretic Peptide 134.0 (H) 0.0 - 100.0 pg/mL    Comment: Performed at [facility], [address], [place], [place] [address]  CBG monitoring, ED     Status: Abnormal   Collection Time: 08/28/21  8:12 AM  Result Value Ref Range   Glucose-Capillary 361 (H) 70 - 99 mg/dL    Comment: Glucose reference range applies only to samples taken after fasting for at least 8 hours.  Glucose, capillary     Status: Abnormal   Collection Time: 08/28/21 11:32 AM  Result Value Ref Range   Glucose-Capillary 354 (H) 70 - 99 mg/dL    Comment: Glucose reference range applies only to samples taken after fasting for at least 8 hours.  Glucose, capillary     Status: Abnormal   Collection Time: 08/28/21  4:24 PM  Result Value Ref Range   Glucose-Capillary 114 (H) 70 - 99 mg/dL    Comment: Glucose reference range applies only to samples taken after fasting for at least 8 hours.   Comment 1 Notify RN    Comment 2 Document in Chart   Glucose, capillary     Status: None   Collection Time: 08/28/21  5:43 PM  Result Value Ref Range   Glucose-Capillary 90 70 - 99 mg/dL    Comment: Glucose reference range applies only to samples taken after fasting for at least 8 hours.   Glucose, capillary     Status: Abnormal   Collection Time: 08/28/21  9:51 PM  Result Value Ref Range   Glucose-Capillary 128 (H) 70 - 99 mg/dL    Comment: Glucose reference range applies only to samples taken after fasting for at least 8 hours.  CBC with Differential/Platelet     Status: Abnormal   Collection Time: 08/29/21  5:17 AM  Result Value Ref Range   WBC 5.1 4.0 - 10.5 K/uL   RBC 4.40 4.22 - 5.81 MIL/uL   Hemoglobin 13.8 13.0 - 17.0 g/dL   HCT 42.8 39.0 - 52.0 %   MCV 97.3 80.0 - 100.0 fL   MCH 31.4 26.0 - 34.0 pg   MCHC 32.2 30.0 - 36.0 g/dL   RDW 17.2 (H) 11.5 - 15.5 %   Platelets 96 (L) 150 - 400 K/uL  Comment: SPECIMEN CHECKED FOR CLOTS Immature Platelet Fraction may be clinically indicated, consider ordering this additional test [id number] CONSISTENT WITH PREVIOUS RESULT    nRBC 0.0 0.0 - 0.2 %   Neutrophils Relative % 73 %   Neutro Abs 3.8 1.7 - 7.7 K/uL   Lymphocytes Relative 12 %   Lymphs Abs 0.6 (L) 0.7 - 4.0 K/uL   Monocytes Relative 12 %   Monocytes Absolute 0.6 0.1 - 1.0 K/uL   Eosinophils Relative 1 %   Eosinophils Absolute 0.1 0.0 - 0.5 K/uL   Basophils Relative 1 %   Basophils Absolute 0.0 0.0 - 0.1 K/uL   Immature Granulocytes 1 %   Abs Immature Granulocytes 0.03 0.00 - 0.07 K/uL    Comment: Performed at [facility], [address] [place], [place] [address]  Comprehensive metabolic panel     Status: Abnormal   Collection Time: 08/29/21  5:17 AM  Result Value Ref Range   Sodium 138 135 - 145 mmol/L   Potassium 4.0 3.5 - 5.1 mmol/L   Chloride 102 98 - 111 mmol/L   CO2 24 22 - 32 mmol/L   Glucose, Bld 166 (H) 70 - 99 mg/dL    Comment: Glucose reference range applies only to samples taken after fasting for at least 8 hours.   BUN 35 (H) 8 - 23 mg/dL   Creatinine, Ser 1.54 (H) 0.61 - 1.24 mg/dL   Calcium 8.5 (L) 8.9 - 10.3 mg/dL   Total Protein 6.3 (L) 6.5 - 8.1 g/dL   Albumin 3.4 (L) 3.5 - 5.0 g/dL   AST 16 15 - 41 U/L   ALT 23 0 - 44 U/L    Alkaline Phosphatase 58 38 - 126 U/L   Total Bilirubin 1.3 (H) 0.3 - 1.2 mg/dL   GFR, Estimated 44 (L) >60 mL/min    Comment: (NOTE) Calculated using the CKD-EPI Creatinine Equation (2021)    Anion gap 12 5 - 15    Comment: Performed at [facility], [address] [place], [place] [address]  Magnesium     Status: None   Collection Time: 08/29/21  5:17 AM  Result Value Ref Range   Magnesium 2.0 1.7 - 2.4 mg/dL    Comment: Performed at [facility], [address], [place], [place] [address]  Glucose, capillary     Status: Abnormal   Collection Time: 08/29/21  7:35 AM  Result Value Ref Range   Glucose-Capillary 175 (H) 70 - 99 mg/dL    Comment: Glucose reference range applies only to samples taken after fasting for at least 8 hours.  Glucose, capillary     Status: Abnormal   Collection Time: 08/29/21 10:10 AM  Result Value Ref Range   Glucose-Capillary 254 (H) 70 - 99 mg/dL    Comment: Glucose reference range applies only to samples taken after fasting for at least 8 hours.   Comment 1 Notify RN    Comment 2 Document in Chart   Glucose, capillary     Status: Abnormal   Collection Time: 08/29/21 11:14 AM  Result Value Ref Range   Glucose-Capillary 235 (H) 70 - 99 mg/dL    Comment: Glucose reference range applies only to samples taken after fasting for at least 8 hours.   Recent Results (from the past 240 hour(s))  Blood culture (routine x 2)     Status: None (Preliminary result)   Collection Time: 08/27/21  6:33 PM   Specimen: BLOOD LEFT FOREARM  Result Value Ref Range Status  Specimen Description BLOOD LEFT FOREARM  Final   Special Requests   Final    BOTTLES DRAWN AEROBIC AND ANAEROBIC Blood Culture adequate volume   Culture   Final    NO GROWTH 2 DAYS Performed at [facility], [address], [place], [place] [address]    Report Status PENDING  Incomplete  Blood culture (routine x 2)     Status: None (Preliminary result)   Collection Time: 08/27/21  6:33 PM    Specimen: Left Antecubital; Blood  Result Value Ref Range Status   Specimen Description LEFT ANTECUBITAL  Final   Special Requests   Final    BOTTLES DRAWN AEROBIC AND ANAEROBIC Blood Culture adequate volume   Culture   Final    NO GROWTH 2 DAYS Performed at [facility], [address], [place], [place] [address]    Report Status PENDING  Incomplete  Resp Panel by RT-PCR (Flu A&B, Covid) Nasopharyngeal Swab     Status: None   Collection Time: 08/27/21  6:38 PM   Specimen: Nasopharyngeal Swab; Nasopharyngeal(NP) swabs in vial transport medium  Result Value Ref Range Status   SARS Coronavirus 2 by RT PCR NEGATIVE NEGATIVE Final    Comment: (NOTE) SARS-CoV-2 target nucleic acids are NOT DETECTED.  The SARS-CoV-2 RNA is generally detectable in upper respiratory specimens during the acute phase of infection. The lowest concentration of SARS-CoV-2 viral copies this assay can detect is 138 copies/mL. A negative result does not preclude SARS-Cov-2 infection and should not be used as the sole basis for treatment or other patient management decisions. A negative result may occur with  improper specimen collection/handling, submission of specimen other than nasopharyngeal swab, presence of viral mutation(s) within the areas targeted by this assay, and inadequate number of viral copies(<138 copies/mL). A negative result must be combined with clinical observations, patient history, and epidemiological information. The expected result is Negative.  Fact Sheet for Patients:  EntrepreneurPulse.com.au  Fact Sheet for Healthcare Providers:  IncredibleEmployment.be  This test is no t yet approved or cleared by the [place] FDA and  has been authorized for detection and/or diagnosis of SARS-CoV-2 by FDA under an Emergency Use Authorization (EUA). This EUA will remain  in effect (meaning this test can be used) for the duration of the COVID-19 declaration  under Section 564(b)(1) of the Act, 21 U.S.C.section 360bbb-3(b)(1), unless the authorization is terminated  or revoked sooner.       Influenza A by PCR NEGATIVE NEGATIVE Final   Influenza B by PCR NEGATIVE NEGATIVE Final    Comment: (NOTE) The Xpert Xpress SARS-CoV-2/FLU/RSV plus assay is intended as an aid in the diagnosis of influenza from Nasopharyngeal swab specimens and should not be used as a sole basis for treatment. Nasal washings and aspirates are unacceptable for Xpert Xpress SARS-CoV-2/FLU/RSV testing.  Fact Sheet for Patients: EntrepreneurPulse.com.au  Fact Sheet for Healthcare Providers: IncredibleEmployment.be  This test is not yet approved or cleared by the [place] FDA and has been authorized for detection and/or diagnosis of SARS-CoV-2 by FDA under an Emergency Use Authorization (EUA). This EUA will remain in effect (meaning this test can be used) for the duration of the COVID-19 declaration under Section 564(b)(1) of the Act, 21 U.S.C. section 360bbb-3(b)(1), unless the authorization is terminated or revoked.  Performed at [facility], [address], [place], [place] [address]   Urine Culture     Status: Abnormal (Preliminary result)   Collection Time: 08/28/21  2:14 AM   Specimen: Urine, Clean  Catch  Result Value Ref Range Status   Specimen Description   Final    URINE, CLEAN CATCH Performed at [facility], [address], [place], [place] [address]    Special Requests   Final    NONE Performed at [facility], [address], [place], [place] [address]    Culture (A)  Final    >=100,000 COLONIES/mL ESCHERICHIA COLI SUSCEPTIBILITIES TO FOLLOW Performed at [facility] [address], [place], [place] [address]    Report Status PENDING  Incomplete   Creatinine: Recent Labs    08/27/21 1836 08/28/21 0430 08/29/21 0517  CREATININE 2.06* 1.83* 1.54*   Baseline Creatinine:  1  Impression/Assessment:  85yo with urinary retention and left hydronephrosis  Plan:  Urinary retention: please place a 14 french foley and the foley should remain in place for 1 week.  Left hydronephrosis: I dicussed the natural history of hydronephrosis and the various etiologies. His hydronephrosis is likely secondary to urinary retention and will likely resolve with foley catheter placement Sepsis: Please continue broad spectrum antibiotics pending his urine culture  [name] 08/29/2021, 12:06 PM

## 2021-08-29 NOTE — Hospital Course (Signed)
[name] is 86 y.o. male with a history of hyperlipidemia, diabetes mellitus type 2, hypertension, CKD, CAD, B12 deficiency, mantle cell lymphoma of the colon, GERD, PVD, prostate cancer, OSA not on CPAP. Patient presented secondary to generalized body aches and found to have evidence of a UTI, meeting sepsis criteria on admission. Empiric antibiotics initiated. Patient developed evidence of urinary retention and imaging was consistent with left sided hydronephrosis. Urology consulted. Foley catheter placed. E. Coli growing on urine culture.

## 2021-08-29 NOTE — Assessment & Plan Note (Addendum)
Left sided. Likely related to bladder outlet obstruction. Foley catheter placed per urology recommendations. CT renal study without definitive evidence of kidney [place]. Urology recommend continued foley catheter and voiding trial in 1 week.

## 2021-08-29 NOTE — Progress Notes (Signed)
PROGRESS NOTE    [name]  PRF:[id number] DOB: 1935/01/05 DOA: 08/27/2021 PCP: Celene Squibb, MD   Brief Narrative: [name] is 86 y.o. male with a history of hyperlipidemia, diabetes mellitus type 2, hypertension, CKD, CAD, B12 deficiency, mantle cell lymphoma of the colon, GERD, PVD, prostate cancer, OSA not on CPAP. Patient presented secondary to generalized body aches and found to have evidence of a UTI, meeting sepsis criteria on admission. Empiric antibiotics initiated. Patient developed evidence of urinary retention and imaging was consistent with left sided hydronephrosis. Urology consulted. Foley catheter placed. E. Coli growing on urine culture.   Assessment & Plan:   * Sepsis secondary to UTI [facility]) Present on admission. Started empirically on Ceftriaxone. Urine culture significant for E. Coli with sensitivities pending. CT renal with evidence of possible pyelonephritis. Associated gross hematuria. -Continue Ceftriaxone IV -Follow up urine culture sensitivities  Tremors of nervous system Patient is on propranolol as an outpatient which was held on admission.  Obesity (BMI 30.0-34.9) Body mass index is 30.15 kg/m.  Asthma Asymptomatic -Albuterol prn  Type 2 diabetes mellitus with chronic kidney disease, with long-term current use of insulin (HCC) Hemoglobin A1C of 8.6%. Patient is on Humalog sliding scale and glipizide as an outpatient. He is also prescribed Toujeo 25 units daily, but is taking 80 units daily. Patient started on Semglee 25 units daily (starting 12/7) and SSI inpatient -Continue Semglee 25 units and SSI  Hydronephrosis Left sided. Likely related to bladder outlet obstruction. Foley catheter placed per urology recommendations. CT renal study without definitive evidence of kidney [place] -Urology recommendations: foley catheter for at least 1 week  Peripheral polyneuropathy Gabapentin held.  Acute renal failure superimposed on stage 3b  chronic kidney disease ([place]) Most recent creatinine baseline of 1.3 from September 22. Creatinine of 2.06 on admission secondary to hydronephrosis. Improving with treatment of hydronephrosis.  Thrombocytopenia, unspecified ([place]) Patient with a chronic component, but currently an acute component in setting of infection. Stable. No hemorrhaging.  Mantle cell lymphoma ([place]) Prior history.  GERD (gastroesophageal reflux disease) -Resume Protonix at daily dosing  Hypomagnesemia-resolved as of 08/29/2021 Magnesium replaced.    DVT prophylaxis: SCDs Code Status:   Code Status: Full Code Family Communication: Wife at bedside Disposition Plan: Discharge home vs SNF likely in 2 days pending culture data, urology recommendations, PT/OT recommendations   Consultants:  Urology  Procedures:  None  Antimicrobials: Ceftriaxone IV    Subjective: No issues this morning. Some mild flank pain that has improved. States a lot of urine output after foley catheter was placed  Objective: Vitals:   08/28/21 2021 08/28/21 2154 08/29/21 0514 08/29/21 0807  BP:  105/73 (!) 155/91   Pulse:  79 95   Resp:  20 16   Temp:  (!) 97.5 F (36.4 C) 98.3 F (36.8 C)   TempSrc:  Oral    SpO2: (!) 87% 97% 90% 90%  Weight:      Height:        Intake/Output Summary (Last 24 hours) at 08/29/2021 1307 Last data filed at 08/29/2021 0921 Gross per 24 hour  Intake 956 ml  Output 1550 ml  Net -594 ml   Filed Weights   08/27/21 1818 08/28/21 1140  Weight: 101 kg 98.1 kg    Examination:  General exam: Appears calm and comfortable Respiratory system: Clear to auscultation. Respiratory effort normal. Cardiovascular system: S1 & S2 heard, RRR. No murmurs, rubs, gallops or clicks. Gastrointestinal system: Abdomen is nondistended, soft  and with left sided flank tenderness. No organomegaly or masses felt. Normal bowel sounds heard. Central nervous system: Alert and oriented. No focal neurological  deficits. Musculoskeletal: No edema. No calf tenderness Skin: No cyanosis. No rashes Psychiatry: Judgement and insight appear normal. Mood & affect appropriate.     Data Reviewed: I have personally reviewed following labs and imaging studies  CBC Lab Results  Component Value Date   WBC 5.1 08/29/2021   RBC 4.40 08/29/2021   HGB 13.8 08/29/2021   HCT 42.8 08/29/2021   MCV 97.3 08/29/2021   MCH 31.4 08/29/2021   PLT 96 (L) 08/29/2021   MCHC 32.2 08/29/2021   RDW 17.2 (H) 08/29/2021   LYMPHSABS 0.6 (L) 08/29/2021   MONOABS 0.6 08/29/2021   EOSABS 0.1 08/29/2021   BASOSABS 0.0 19/41/7408     Last metabolic panel Lab Results  Component Value Date   NA 138 08/29/2021   K 4.0 08/29/2021   CL 102 08/29/2021   CO2 24 08/29/2021   BUN 35 (H) 08/29/2021   CREATININE 1.54 (H) 08/29/2021   GLUCOSE 166 (H) 08/29/2021   GFRNONAA 44 (L) 08/29/2021   GFRAA 48 (L) 02/23/2018   CALCIUM 8.5 (L) 08/29/2021   PHOS 2.7 08/28/2021   PROT 6.3 (L) 08/29/2021   ALBUMIN 3.4 (L) 08/29/2021   BILITOT 1.3 (H) 08/29/2021   ALKPHOS 58 08/29/2021   AST 16 08/29/2021   ALT 23 08/29/2021   ANIONGAP 12 08/29/2021    CBG (last 3)  Recent Labs    08/29/21 0735 08/29/21 1010 08/29/21 1114  GLUCAP 175* 254* 235*     GFR: Estimated Creatinine Clearance: 43.4 mL/min (A) (by C-G formula based on SCr of 1.54 mg/dL (H)).  Coagulation Profile: No results for input(s): INR, PROTIME in the last 168 hours.  Recent Results (from the past 240 hour(s))  Blood culture (routine x 2)     Status: None (Preliminary result)   Collection Time: 08/27/21  6:33 PM   Specimen: BLOOD LEFT FOREARM  Result Value Ref Range Status   Specimen Description BLOOD LEFT FOREARM  Final   Special Requests   Final    BOTTLES DRAWN AEROBIC AND ANAEROBIC Blood Culture adequate volume   Culture   Final    NO GROWTH 2 DAYS Performed at [facility], [address], [place], [place] [address]    Report Status PENDING   Incomplete  Blood culture (routine x 2)     Status: None (Preliminary result)   Collection Time: 08/27/21  6:33 PM   Specimen: Left Antecubital; Blood  Result Value Ref Range Status   Specimen Description LEFT ANTECUBITAL  Final   Special Requests   Final    BOTTLES DRAWN AEROBIC AND ANAEROBIC Blood Culture adequate volume   Culture   Final    NO GROWTH 2 DAYS Performed at [facility], [address], [place], [place] [address]    Report Status PENDING  Incomplete  Resp Panel by RT-PCR (Flu A&B, Covid) Nasopharyngeal Swab     Status: None   Collection Time: 08/27/21  6:38 PM   Specimen: Nasopharyngeal Swab; Nasopharyngeal(NP) swabs in vial transport medium  Result Value Ref Range Status   SARS Coronavirus 2 by RT PCR NEGATIVE NEGATIVE Final    Comment: (NOTE) SARS-CoV-2 target nucleic acids are NOT DETECTED.  The SARS-CoV-2 RNA is generally detectable in upper respiratory specimens during the acute phase of infection. The lowest concentration of SARS-CoV-2 viral copies this assay can detect is 138 copies/mL. A  negative result does not preclude SARS-Cov-2 infection and should not be used as the sole basis for treatment or other patient management decisions. A negative result may occur with  improper specimen collection/handling, submission of specimen other than nasopharyngeal swab, presence of viral mutation(s) within the areas targeted by this assay, and inadequate number of viral copies(<138 copies/mL). A negative result must be combined with clinical observations, patient history, and epidemiological information. The expected result is Negative.  Fact Sheet for Patients:  EntrepreneurPulse.com.au  Fact Sheet for Healthcare Providers:  IncredibleEmployment.be  This test is no t yet approved or cleared by the [place] FDA and  has been authorized for detection and/or diagnosis of SARS-CoV-2 by FDA under an Emergency Use Authorization  (EUA). This EUA will remain  in effect (meaning this test can be used) for the duration of the COVID-19 declaration under Section 564(b)(1) of the Act, 21 U.S.C.section 360bbb-3(b)(1), unless the authorization is terminated  or revoked sooner.       Influenza A by PCR NEGATIVE NEGATIVE Final   Influenza B by PCR NEGATIVE NEGATIVE Final    Comment: (NOTE) The Xpert Xpress SARS-CoV-2/FLU/RSV plus assay is intended as an aid in the diagnosis of influenza from Nasopharyngeal swab specimens and should not be used as a sole basis for treatment. Nasal washings and aspirates are unacceptable for Xpert Xpress SARS-CoV-2/FLU/RSV testing.  Fact Sheet for Patients: EntrepreneurPulse.com.au  Fact Sheet for Healthcare Providers: IncredibleEmployment.be  This test is not yet approved or cleared by the [place] FDA and has been authorized for detection and/or diagnosis of SARS-CoV-2 by FDA under an Emergency Use Authorization (EUA). This EUA will remain in effect (meaning this test can be used) for the duration of the COVID-19 declaration under Section 564(b)(1) of the Act, 21 U.S.C. section 360bbb-3(b)(1), unless the authorization is terminated or revoked.  Performed at [facility], [address], [place], [place] [address]   Urine Culture     Status: Abnormal (Preliminary result)   Collection Time: 09-01-2021  2:14 AM   Specimen: Urine, Clean Catch  Result Value Ref Range Status   Specimen Description   Final    URINE, CLEAN CATCH Performed at [facility], [address], [place], [place] [address]    Special Requests   Final    NONE Performed at [facility], [address], [place], [place] [address]    Culture (A)  Final    >=100,000 COLONIES/mL ESCHERICHIA COLI SUSCEPTIBILITIES TO FOLLOW Performed at [facility] [address] [place], [place] [address]    Report Status PENDING  Incomplete        Radiology Studies: US  RENAL  Result Date: 09-01-2021 CLINICAL DATA:  Acute kidney injury. EXAM: RENAL / URINARY TRACT ULTRASOUND COMPLETE COMPARISON:  CT chest, abdomen, and pelvis dated May 19, 2021. FINDINGS: Right Kidney: Renal measurements: 9.8 x 5.4 x 6.0 cm = volume: 167 mL. Increased echogenicity. No mass or hydronephrosis visualized. Left Kidney: Renal measurements: 12.3 x 5.7 x 6.7 cm = volume: 246 mL. Increased echogenicity. Mild to moderate hydronephrosis. Multiple simple cysts measuring up to 3.6 cm. Bladder: Appears normal for degree of bladder distention. Other: None. IMPRESSION: 1. Mild to moderate left hydronephrosis. 2. Increased renal echogenicity, consistent with medical renal disease. Electronically Signed   By: Titus Dubin M.D.   On: 09-01-21 09:37   DG Chest Portable 1 View  Result Date: 08/27/2021 CLINICAL DATA:  Fever. EXAM: PORTABLE CHEST 1 VIEW COMPARISON:  Chest radiograph dated 06/04/2021.  FINDINGS: Shallow inspiration. There is mild cardiomegaly with mild vascular congestion. No focal consolidation, pleural effusion, pneumothorax. Atherosclerotic calcification of the aorta. Degenerative changes of the spine. Lower cervical ACDF. No acute osseous pathology. IMPRESSION: Cardiomegaly with mild vascular congestion. No focal consolidation. Electronically Signed   By: Anner Crete M.D.   On: 08/27/2021 18:57   CT RENAL [place]ng STUDY  Result Date: 08/28/2021 CLINICAL DATA:  Hydronephrosis EXAM: CT ABDOMEN AND PELVIS WITHOUT CONTRAST TECHNIQUE: Multidetector CT imaging of the abdomen and pelvis was performed following the standard protocol without IV contrast. COMPARISON:  Same-day renal ultrasound, CT abdomen/pelvis 05/19/2021 FINDINGS: Lower chest: The lung bases are clear. The imaged heart is unremarkable. Hepatobiliary: The liver is unremarkable. Small calcified gallstones are seen in the gallbladder neck. There is no evidence of acute cholecystitis. There is no biliary ductal dilatation.  Pancreas: The pancreas demonstrates fatty atrophy, particularly involving the distal body and tail. There are no focal lesions or contour abnormalities. There is no main pancreatic ductal dilatation or peripancreatic inflammatory change. Spleen: Unremarkable. Adrenals/Urinary Tract: The adrenals are unremarkable. Multiple hypodense lesions are seen in each kidney, some of which are exophytic, consistent with multiple cysts. There is mild-to-moderate left and mild right hydroureteronephrosis. No obstructing lesion or Ke[place] is seen. There is mild stranding around the left ureter. There is mild symmetric bilateral perinephric stranding, slightly increased since the prior study of 05/19/2021. The bladder is mildly distended. No stones or other lesions are seen within the bladder, within the confines of noncontrast technique. Stomach/Bowel: The stomach is unremarkable. There is no evidence of bowel obstruction. There is no abnormal bowel wall thickening or inflammatory change. There are scattered colonic diverticuli without evidence of acute diverticulitis. Vascular/Lymphatic: There is extensive calcified atherosclerotic plaque throughout the nonaneurysmal abdominal aorta. There is no abdominal or pelvic lymphadenopathy. Reproductive: The prostate is not identified and presumed surgically absent. The previously seen penile prosthesis has been removed. Other: There is no ascites or free air. Musculoskeletal: There is no acute osseous abnormality or aggressive osseous lesion. A bone island is noted in the right iliac wing. There is multilevel degenerative change throughout the lumbar spine. IMPRESSION: 1. Mild-to-moderate left and mild right hydroureteronephrosis with bilateral perinephric stranding and mild left periureteral stranding. No obstructing lesion or Ambrosini is seen. Findings may reflect sequela of recently passed stones, though this is felt unlikely as no stones were present on the study from 05/19/2021.  Stricture or chronic outlet obstruction are additional considerations. 2. Pyelonephritis can not be excluded on this noncontrast study. 3. Interval removal of the penile prosthesis since the study of 05/19/2021. 4. Diverticulosis without evidence of acute diverticulitis. 5. Cholelithiasis without evidence of acute cholecystitis. 6.  Aortic Atherosclerosis (ICD10-I70.0). Electronically Signed   By: Valetta Mole M.D.   On: 08/28/2021 16:04        Scheduled Meds:  amLODipine  5 mg Oral Daily   Chlorhexidine Gluconate Cloth  6 each Topical Daily   docusate sodium  100 mg Oral BID   insulin aspart  0-15 Units Subcutaneous TID WC   insulin aspart  0-5 Units Subcutaneous QHS   insulin aspart  6 Units Subcutaneous TID WC   insulin glargine-yfgn  25 Units Subcutaneous Daily   mouth rinse  15 mL Mouth Rinse BID   mometasone-formoterol  2 puff Inhalation BID   Continuous Infusions:  cefTRIAXone (ROCEPHIN)  IV 1 g (08/29/21 1028)     LOS: 2 days     Cordelia Poche, MD [facility] 08/29/2021,  1:07 PM  If 7PM-7AM, please contact night-coverage [email or web address]

## 2021-08-29 NOTE — Progress Notes (Signed)
Urethral catheter 14 fr placed at 0834 by Pollie Friar, NT 3. Assisted by this nurse. Pt tolerated well.

## 2021-08-30 DIAGNOSIS — N10 Acute tubulo-interstitial nephritis: Secondary | ICD-10-CM

## 2021-08-30 LAB — CBC WITH DIFFERENTIAL/PLATELET
Abs Immature Granulocytes: 0.04 10*3/uL (ref 0.00–0.07)
Basophils Absolute: 0 10*3/uL (ref 0.0–0.1)
Basophils Relative: 0 %
Eosinophils Absolute: 0.1 10*3/uL (ref 0.0–0.5)
Eosinophils Relative: 1 %
HCT: 41.8 % (ref 39.0–52.0)
Hemoglobin: 13.8 g/dL (ref 13.0–17.0)
Immature Granulocytes: 1 %
Lymphocytes Relative: 13 %
Lymphs Abs: 0.9 10*3/uL (ref 0.7–4.0)
MCH: 31.4 pg (ref 26.0–34.0)
MCHC: 33 g/dL (ref 30.0–36.0)
MCV: 95.2 fL (ref 80.0–100.0)
Monocytes Absolute: 0.6 10*3/uL (ref 0.1–1.0)
Monocytes Relative: 8 %
Neutro Abs: 5.2 10*3/uL (ref 1.7–7.7)
Neutrophils Relative %: 77 %
Platelets: 119 10*3/uL — ABNORMAL LOW (ref 150–400)
RBC: 4.39 MIL/uL (ref 4.22–5.81)
RDW: 17.2 % — ABNORMAL HIGH (ref 11.5–15.5)
WBC: 6.7 10*3/uL (ref 4.0–10.5)
nRBC: 0 % (ref 0.0–0.2)

## 2021-08-30 LAB — COMPREHENSIVE METABOLIC PANEL
ALT: 22 U/L (ref 0–44)
AST: 17 U/L (ref 15–41)
Albumin: 3.5 g/dL (ref 3.5–5.0)
Alkaline Phosphatase: 61 U/L (ref 38–126)
Anion gap: 11 (ref 5–15)
BUN: 37 mg/dL — ABNORMAL HIGH (ref 8–23)
CO2: 27 mmol/L (ref 22–32)
Calcium: 8.5 mg/dL — ABNORMAL LOW (ref 8.9–10.3)
Chloride: 97 mmol/L — ABNORMAL LOW (ref 98–111)
Creatinine, Ser: 1.68 mg/dL — ABNORMAL HIGH (ref 0.61–1.24)
GFR, Estimated: 40 mL/min — ABNORMAL LOW (ref >60)
Glucose, Bld: 245 mg/dL — ABNORMAL HIGH (ref 70–99)
Potassium: 4.1 mmol/L (ref 3.5–5.1)
Sodium: 135 mmol/L (ref 135–145)
Total Bilirubin: 1.2 mg/dL (ref 0.3–1.2)
Total Protein: 6.6 g/dL (ref 6.5–8.1)

## 2021-08-30 LAB — MAGNESIUM: Magnesium: 1.8 mg/dL (ref 1.7–2.4)

## 2021-08-30 LAB — URINE CULTURE: Culture: >=100000 — AB

## 2021-08-30 LAB — GLUCOSE, CAPILLARY
Glucose-Capillary: 176 mg/dL — ABNORMAL HIGH (ref 70–99)
Glucose-Capillary: 240 mg/dL — ABNORMAL HIGH (ref 70–99)
Glucose-Capillary: 229 mg/dL — ABNORMAL HIGH (ref 70–99)
Glucose-Capillary: 228 mg/dL — ABNORMAL HIGH (ref 70–99)
Glucose-Capillary: 215 mg/dL — ABNORMAL HIGH (ref 70–99)
Glucose-Capillary: 213 mg/dL — ABNORMAL HIGH (ref 70–99)

## 2021-08-30 MED ORDER — INSULIN ASPART 100 UNIT/ML IJ SOLN
8.0000 [IU] | Freq: Three times a day (TID) | INTRAMUSCULAR | Status: DC
  Administered 2021-08-30 – 2021-08-31 (×2): 8 [IU] via SUBCUTANEOUS

## 2021-08-30 MED ORDER — CEFAZOLIN SODIUM-DEXTROSE 1-4 GM/50ML-% IV SOLN
1.0000 g | Freq: Three times a day (TID) | INTRAVENOUS | Status: DC
Start: 2021-08-31 — End: 2021-08-31
  Administered 2021-08-31: 1 g via INTRAVENOUS
  Filled 2021-08-30 (×11): qty 50

## 2021-08-30 MED ORDER — INSULIN GLARGINE-YFGN 100 UNIT/ML ~~LOC~~ SOLN
30.0000 [IU] | Freq: Every day | SUBCUTANEOUS | Status: DC
  Filled 2021-08-30 (×5): qty 0.3

## 2021-08-30 MED ORDER — SODIUM CHLORIDE 0.45 % IV SOLN: INTRAVENOUS | Status: DC

## 2021-08-30 NOTE — Progress Notes (Signed)
Subjective: Patient reports decreased suprapubic pain with the foley in place. Afebrile overnight. Urine culture growing ecoli sensitive to bactrim and ceftin. No other complaints today. Urine clear  Objective: Vital signs in last 24 hours: Temp:  [98.3 F (36.8 C)-98.8 F (37.1 C)] 98.4 F (36.9 C) (12/08 0500) Pulse Rate:  [82-108] 108 (12/08 0500) Resp:  [16-18] 18 (12/08 0500) BP: (112-131)/(71-75) 112/75 (12/08 0500) SpO2:  [89 %-94 %] 91 % (12/08 0758)  Intake/Output from previous day: 12/07 0701 - 12/08 0700 In: 1536 [P.O.:1436; IV Piggyback:100] Out: 1200 [Urine:1200] Intake/Output this shift: Total I/O In: 480 [P.O.:480] Out: -   Physical Exam:  General:alert, cooperative, and appears stated age GI: soft, non tender, normal bowel sounds, no palpable masses, no organomegaly, no inguinal hernia Male genitalia: not done Extremities: extremities normal, atraumatic, no cyanosis or edema  Lab Results: Recent Labs    08/28/21 0430 08/29/21 0517 08/30/21 0518  HGB 13.5 13.8 13.8  HCT 40.8 42.8 41.8   BMET Recent Labs    08/29/21 0517 08/30/21 0518  NA 138 135  K 4.0 4.1  CL 102 97*  CO2 24 27  GLUCOSE 166* 245*  BUN 35* 37*  CREATININE 1.54* 1.68*  CALCIUM 8.5* 8.5*   No results for input(s): LABPT, INR in the last 72 hours. No results for input(s): LABURIN in the last 72 hours. Results for orders placed or performed during the hospital encounter of 08/27/21  Blood culture (routine x 2)     Status: None (Preliminary result)   Collection Time: 08/27/21  6:33 PM   Specimen: BLOOD LEFT FOREARM  Result Value Ref Range Status   Specimen Description BLOOD LEFT FOREARM  Final   Special Requests   Final    BOTTLES DRAWN AEROBIC AND ANAEROBIC Blood Culture adequate volume   Culture   Final    NO GROWTH 2 DAYS Performed at [facility], [address], [place], [place] [address]    Report Status PENDING  Incomplete  Blood culture (routine x 2)     Status:  None (Preliminary result)   Collection Time: 08/27/21  6:33 PM   Specimen: Left Antecubital; Blood  Result Value Ref Range Status   Specimen Description LEFT ANTECUBITAL  Final   Special Requests   Final    BOTTLES DRAWN AEROBIC AND ANAEROBIC Blood Culture adequate volume   Culture   Final    NO GROWTH 2 DAYS Performed at [facility], [address], [place], [place] [address]    Report Status PENDING  Incomplete  Resp Panel by RT-PCR (Flu A&B, Covid) Nasopharyngeal Swab     Status: None   Collection Time: 08/27/21  6:38 PM   Specimen: Nasopharyngeal Swab; Nasopharyngeal(NP) swabs in vial transport medium  Result Value Ref Range Status   SARS Coronavirus 2 by RT PCR NEGATIVE NEGATIVE Final    Comment: (NOTE) SARS-CoV-2 target nucleic acids are NOT DETECTED.  The SARS-CoV-2 RNA is generally detectable in upper respiratory specimens during the acute phase of infection. The lowest concentration of SARS-CoV-2 viral copies this assay can detect is 138 copies/mL. A negative result does not preclude SARS-Cov-2 infection and should not be used as the sole basis for treatment or other patient management decisions. A negative result may occur with  improper specimen collection/handling, submission of specimen other than nasopharyngeal swab, presence of viral mutation(s) within the areas targeted by this assay, and inadequate number of viral copies(<138 copies/mL). A negative result must be combined with clinical observations, patient history,  and epidemiological information. The expected result is Negative.  Fact Sheet for Patients:  EntrepreneurPulse.com.au  Fact Sheet for Healthcare Providers:  IncredibleEmployment.be  This test is no t yet approved or cleared by the [place] FDA and  has been authorized for detection and/or diagnosis of SARS-CoV-2 by FDA under an Emergency Use Authorization (EUA). This EUA will remain  in effect (meaning this  test can be used) for the duration of the COVID-19 declaration under Section 564(b)(1) of the Act, 21 U.S.C.section 360bbb-3(b)(1), unless the authorization is terminated  or revoked sooner.       Influenza A by PCR NEGATIVE NEGATIVE Final   Influenza B by PCR NEGATIVE NEGATIVE Final    Comment: (NOTE) The Xpert Xpress SARS-CoV-2/FLU/RSV plus assay is intended as an aid in the diagnosis of influenza from Nasopharyngeal swab specimens and should not be used as a sole basis for treatment. Nasal washings and aspirates are unacceptable for Xpert Xpress SARS-CoV-2/FLU/RSV testing.  Fact Sheet for Patients: EntrepreneurPulse.com.au  Fact Sheet for Healthcare Providers: IncredibleEmployment.be  This test is not yet approved or cleared by the [place] FDA and has been authorized for detection and/or diagnosis of SARS-CoV-2 by FDA under an Emergency Use Authorization (EUA). This EUA will remain in effect (meaning this test can be used) for the duration of the COVID-19 declaration under Section 564(b)(1) of the Act, 21 U.S.C. section 360bbb-3(b)(1), unless the authorization is terminated or revoked.  Performed at [facility], [address] [place], [place] [address]   Urine Culture     Status: Abnormal   Collection Time: 08/28/21  2:14 AM   Specimen: Urine, Clean Catch  Result Value Ref Range Status   Specimen Description   Final    URINE, CLEAN CATCH Performed at [facility], [address], [place], [place] [address]    Special Requests   Final    NONE Performed at [facility], [address] [place], [place] [address]    Culture >=100,000 COLONIES/mL ESCHERICHIA COLI (A)  Final   Report Status 08/30/2021 FINAL  Final   Organism ID, Bacteria ESCHERICHIA COLI (A)  Final      Susceptibility   Escherichia coli - MIC*    AMPICILLIN >=32 RESISTANT Resistant     CEFAZOLIN <=4 SENSITIVE Sensitive     CEFEPIME <=0.12 SENSITIVE Sensitive      CEFTRIAXONE <=0.25 SENSITIVE Sensitive     CIPROFLOXACIN <=0.25 SENSITIVE Sensitive     GENTAMICIN <=1 SENSITIVE Sensitive     IMIPENEM <=0.25 SENSITIVE Sensitive     NITROFURANTOIN <=16 SENSITIVE Sensitive     TRIMETH/SULFA <=20 SENSITIVE Sensitive     AMPICILLIN/SULBACTAM >=32 RESISTANT Resistant     PIP/TAZO <=4 SENSITIVE Sensitive     * >=100,000 COLONIES/mL ESCHERICHIA COLI    Studies/Results: CT RENAL [place] STUDY  Result Date: 08/28/2021 CLINICAL DATA:  Hydronephrosis EXAM: CT ABDOMEN AND PELVIS WITHOUT CONTRAST TECHNIQUE: Multidetector CT imaging of the abdomen and pelvis was performed following the standard protocol without IV contrast. COMPARISON:  Same-day renal ultrasound, CT abdomen/pelvis 05/19/2021 FINDINGS: Lower chest: The lung bases are clear. The imaged heart is unremarkable. Hepatobiliary: The liver is unremarkable. Small calcified gallstones are seen in the gallbladder neck. There is no evidence of acute cholecystitis. There is no biliary ductal dilatation. Pancreas: The pancreas demonstrates fatty atrophy, particularly involving the distal body and tail. There are no focal lesions or contour abnormalities. There is no main pancreatic ductal dilatation or peripancreatic inflammatory change. Spleen: Unremarkable. Adrenals/Urinary Tract: The adrenals  are unremarkable. Multiple hypodense lesions are seen in each kidney, some of which are exophytic, consistent with multiple cysts. There is mild-to-moderate left and mild right hydroureteronephrosis. No obstructing lesion or [place] is seen. There is mild stranding around the left ureter. There is mild symmetric bilateral perinephric stranding, slightly increased since the prior study of 05/19/2021. The bladder is mildly distended. No stones or other lesions are seen within the bladder, within the confines of noncontrast technique. Stomach/Bowel: The stomach is unremarkable. There is no evidence of bowel obstruction. There is no  abnormal bowel wall thickening or inflammatory change. There are scattered colonic diverticuli without evidence of acute diverticulitis. Vascular/Lymphatic: There is extensive calcified atherosclerotic plaque throughout the nonaneurysmal abdominal aorta. There is no abdominal or pelvic lymphadenopathy. Reproductive: The prostate is not identified and presumed surgically absent. The previously seen penile prosthesis has been removed. Other: There is no ascites or free air. Musculoskeletal: There is no acute osseous abnormality or aggressive osseous lesion. A bone island is noted in the right iliac wing. There is multilevel degenerative change throughout the lumbar spine. IMPRESSION: 1. Mild-to-moderate left and mild right hydroureteronephrosis with bilateral perinephric stranding and mild left periureteral stranding. No obstructing lesion or [place] is seen. Findings may reflect sequela of recently passed stones, though this is felt unlikely as no stones were present on the study from 05/19/2021. Stricture or chronic outlet obstruction are additional considerations. 2. Pyelonephritis can not be excluded on this noncontrast study. 3. Interval removal of the penile prosthesis since the study of 05/19/2021. 4. Diverticulosis without evidence of acute diverticulitis. 5. Cholelithiasis without evidence of acute cholecystitis. 6.  Aortic Atherosclerosis (ICD10-I70.0). Electronically Signed   By: Valetta Mole M.D.   On: 08/28/2021 16:04    Assessment/Plan: 85yo with UTI and urinary retention  UTI: Patient can be transition to PO antibiotics for 7 day course 2.   Urinary retention: Patient can be discharged home and followup in 1 week for a voiding trial  LOS: 3 days   Nicolette Bang 08/30/2021, 12:06 PM

## 2021-08-30 NOTE — TOC Initial Note (Signed)
Transition of Care [facility]) - Initial/Assessment Note    Patient Details  Name: [name] MRN: [id number] Date of Birth: 1934-11-19  Transition of Care [facility]) CM/SW Contact:    Iona Beard, [place] Phone Number: 08/30/2021, 11:57 AM  Clinical Narrative:                 CSW spoke with pts wife to complete assessment due to pt only being oriented to person. Pt lives with his wife and is independent at baseline in completing ADLs. Pt is able to drive as needed. Pt has not had [place] services. Pt does not use any DME in the home. CSW spoke with pts wife about PT recommending HH PT at D/C. Pts wife also interested in [facility] RN to work with them on disease management for teaching wife how to check blood sugars. CSW spoke to [place] with [facility] who states that they can accept pt for [facility] services and can do both PT and RN. CSW asked [facility]. TOC to follow.   Expected Discharge Plan: [place] Barriers to Discharge: Continued Medical Work up   Patient Goals and CMS Choice Patient states their goals for this hospitalization and ongoing recovery are:: Home with [facility] CMS Medicare.gov Compare Post Acute Care list provided to:: Patient Represenative (must comment) Choice offered to / list presented to : Spouse  Expected Discharge Plan and Services Expected Discharge Plan: [place] In-house Referral: Clinical Social Work Discharge Planning Services: CM Consult Post Acute Care Choice: [place] arrangements for the past 2 months: Single Family Home                                      Prior Living Arrangements/Services Living arrangements for the past 2 months: Single Family Home Lives with:: Spouse Patient language and need for interpreter reviewed:: Yes Do you feel safe going back to the place where you live?: Yes      Need for Family Participation in Patient Care: Yes (Comment) Care giver support system in place?: Yes  (comment) Current home services: Home PT, Home RN Criminal Activity/Legal Involvement Pertinent to Current Situation/Hospitalization: No - Comment as needed  Activities of Daily Living Home Assistive Devices/Equipment: CBG Meter, Eyeglasses ADL Screening (condition at time of admission) Patient's cognitive ability adequate to safely complete daily activities?: Yes Is the patient deaf or have difficulty hearing?: No Does the patient have difficulty seeing, even when wearing glasses/contacts?: No Does the patient have difficulty concentrating, remembering, or making decisions?: No Patient able to express need for assistance with ADLs?: Yes Does the patient have difficulty dressing or bathing?: No Independently performs ADLs?: Yes (appropriate for developmental age) Does the patient have difficulty walking or climbing stairs?: No Weakness of Legs: None Weakness of Arms/Hands: None  Permission Sought/Granted                  Emotional Assessment Appearance:: Appears stated age Attitude/Demeanor/Rapport: Engaged Affect (typically observed): Accepting Orientation: : Oriented to Self, Oriented to Place, Oriented to  Time, Oriented to Situation Alcohol / Substance Use: Not Applicable Psych Involvement: No (comment)  Admission diagnosis:  UTI (urinary tract infection) [N39.0] AKI (acute kidney injury) ([place]) [N17.9] Urinary tract infection without hematuria, site unspecified [N39.0] Sepsis, due to unspecified organism, unspecified whether acute organ dysfunction present [facility]) [A41.9] Patient Active Problem List   Diagnosis Date Noted  Acute pyelonephritis 08/30/2021   Sepsis secondary to UTI ([place]) 08/28/2021   Hyperglycemia due to diabetes mellitus ([place]) 08/28/2021   Obesity (BMI 30.0-34.9) 08/28/2021   Tremors of nervous system 08/28/2021   Insomnia 08/28/2021   UTI (urinary tract infection) 08/27/2021   COVID-19 virus infection 06/04/2021   Imbalance 05/06/2021    Hydronephrosis 05/06/2021   Acute respiratory failure with hypoxia ([place]) 05/06/2021   Asthma 05/06/2021   Type 2 diabetes mellitus with chronic kidney disease, with long-term current use of insulin (HCC)    Proteinuria 03/14/2021   Diabetic peripheral neuropathy ([place]) 03/07/2021   Asymptomatic cholelithiasis 07/06/2019   Peripheral polyneuropathy 01/16/2019   Multiple pulmonary nodules 08/01/2016   Upper airway cough syndrome 07/30/2016   Chronic asthma vs UACS  07/11/2016   Dupuytren's contracture 06/06/2016   Mild cognitive impairment 01/22/2016   Essential tremor 10/17/2015   Acute renal failure superimposed on stage 3b chronic kidney disease ([place]) 07/02/2015   AKI (acute kidney injury) ([place]) 06/30/2015   Acute kidney injury ([place]) 06/30/2015   Status post total right knee replacement 03/29/2014   B12 deficiency 02/07/2014   Diverticulosis of colon with hemorrhage 01/01/2014   Thrombocytopenia, unspecified ([place]) 01/01/2014   Acute blood loss anemia 01/01/2014   Leukopenia 12/31/2013   Lower GI bleed 12/30/2013   Diverticulitis 12/30/2013   Abdominal pain, left lower quadrant 11/16/2013   Abdominal pain, unspecified site 11/16/2013   Mantle cell lymphoma ([place]) 09/07/2013   Prostate cancer ([place]) 09/07/2013   Proctitis, 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   H/O ulcerative colitis 04/12/2013   OSA (obstructive sleep apnea) 08/15/2012   VASOMOTOR RHINITIS 01/23/2010   GERD (gastroesophageal reflux disease) 01/23/2010   SOB (shortness of breath) 01/01/2010   Mixed hyperlipidemia 11/23/2009   Hypertension 11/23/2009   Coronary atherosclerosis of native coronary artery 02/18/2009   PCP:  Celene Squibb, MD Pharmacy:   [facility], [address] [place] [address] Phone: [phone] Fax: [phone]  [address], [place] [address] [place] [address] Phone: [phone] Fax:  [phone]     Social Determinants of Health (SDOH) Interventions    Readmission Risk Interventions Readmission Risk Prevention Plan 08/30/2021 06/12/2021 06/12/2021  Transportation Screening Complete Complete -  PCP or Specialist Appt within 3-5 Days Complete - Complete  HRI or Home Care Consult Complete - Complete  Social Work Consult for Recovery Care Planning/Counseling Complete - Complete  Palliative Care Screening Not Applicable - Not Applicable  Medication Review Press photographer) Complete - Complete  Some recent data might be hidden

## 2021-08-30 NOTE — Progress Notes (Addendum)
Pt found wondering in the hallway stating, " I want to go home, I would ne netter off at home than here." Pt was redirected back to his room. Pt stated he was nauseous. Administered IV zofran.

## 2021-08-30 NOTE — Progress Notes (Signed)
Pharmacy Antibiotic Note  [name] is a 86 y.o. male admitted on 08/27/2021 with UTI.  Pharmacy has been consulted for cefazolin dosing.  Plan: Cefazolin 1000 mg IV every 8 hours. Monitor labs, c/s, and patient improvement  Height: 5' 11"  (180.3 cm) Weight: 98.1 kg (216 lb 3.2 oz) IBW/kg (Calculated) : 75.3  Temp (24hrs), Avg:98.5 F (36.9 C), Min:98.3 F (36.8 C), Max:98.8 F (37.1 C)  Recent Labs  Lab 08/27/21 1836 08/28/21 0430 08/29/21 0517 08/30/21 0518  WBC 4.5 6.3 5.1 6.7  CREATININE 2.06* 1.83* 1.54* 1.68*  LATICACIDVEN 1.5  --   --   --     Estimated Creatinine Clearance: 39.8 mL/min (A) (by C-G formula based on SCr of 1.68 mg/dL (H)).    Allergies  Allergen Reactions   Bee Venom Anaphylaxis   Adhesive [Tape] Hives   Ciprofloxacin     Unknown    Codeine     Unknown     Iodine     Unknown     Latex     Unknown     Povidone-Iodine     Unknown     Simvastatin     Unknown      Antimicrobials this admission: CTX 12/5 >>12/8 Ancef 12/9 >>    Microbiology results 12/6 Ucx: e. Coli-  Thank you for allowing pharmacy to be a part of this patient's care.  Ramond Craver 08/30/2021 11:48 AM

## 2021-08-30 NOTE — Progress Notes (Signed)
PROGRESS NOTE    [name]  HGD:[id number] DOB: 1935-01-22 DOA: 08/27/2021 PCP: Celene Squibb, MD   Brief Narrative: [name] is 86 y.o. male with a history of hyperlipidemia, diabetes mellitus type 2, hypertension, CKD, CAD, B12 deficiency, mantle cell lymphoma of the colon, GERD, PVD, prostate cancer, OSA not on CPAP. Patient presented secondary to generalized body aches and found to have evidence of a UTI, meeting sepsis criteria on admission. Empiric antibiotics initiated. Patient developed evidence of urinary retention and imaging was consistent with left sided hydronephrosis. Urology consulted. Foley catheter placed. E. Coli growing on urine culture.   Assessment & Plan:   * Sepsis secondary to UTI [facility]) Present on admission. Started empirically on Ceftriaxone. Urine culture significant for E. Coli with sensitivities pending. CT renal with evidence of possible pyelonephritis. Associated gross hematuria. Hematuria has improved. -Transition to Cefazolin IV and eventually cefadroxil when able to consistently take PO  Acute pyelonephritis See Sepsis secondary to UTI.  Tremors of nervous system Patient is on propranolol as an outpatient which was held on admission.  Obesity (BMI 30.0-34.9) Body mass index is 30.15 kg/m.  Asthma Asymptomatic -Albuterol prn  Type 2 diabetes mellitus with chronic kidney disease, with long-term current use of insulin (HCC) Hemoglobin A1C of 8.6%. Patient is on Humalog sliding scale and glipizide as an outpatient. He is also prescribed Toujeo 25 units daily, but is taking 80 units daily. Patient started on Semglee 25 units daily (starting 12/7) and SSI inpatient -Increase to Semglee 30 units and increase to Novolog 8 units TID with meals in addition to continuing SSI  Hydronephrosis Left sided. Likely related to bladder outlet obstruction. Foley catheter placed per urology recommendations. CT renal study without definitive evidence  of kidney Riso -Urology recommendations: foley catheter for at least 1 week  Peripheral polyneuropathy Gabapentin held.  Acute renal failure superimposed on stage 3b chronic kidney disease ([place]) Most recent creatinine baseline of 1.3 from September 22. Creatinine of 2.06 on admission secondary to hydronephrosis. Initial improvement; now slightly worsened. Patient with associated nausea and decreased oral intake. -Start 1/2 NS IV fluids BMP in AM  Thrombocytopenia, unspecified ([place]) Patient with a chronic component, but currently an acute component in setting of infection. No hemorrhaging. Improving.  Mantle cell lymphoma ([place]) Prior history.  GERD (gastroesophageal reflux disease) -Continue Protonix daily  Hypomagnesemia-resolved as of 08/29/2021 Magnesium replaced.    DVT prophylaxis: SCDs Code Status:   Code Status: Full Code Family Communication: None at bedside. Called wife but no response Disposition Plan: Discharge home likely in 24 hours pending stability of creatinine, urology final recommendations, transition to oral antibiotics.   Consultants:  Urology  Procedures:  None  Antimicrobials: Ceftriaxone IV Cefazolin IV   Subjective: Nausea this morning. Unable to eat breakfast. No other concerns.  Objective: Vitals:   08/29/21 1950 08/29/21 2006 08/30/21 0500 08/30/21 0758  BP:  119/71 112/75   Pulse:  89 (!) 108   Resp:  16 18   Temp:  98.8 F (37.1 C) 98.4 F (36.9 C)   TempSrc:  Oral Oral   SpO2: 93% 91% (!) 89% 91%  Weight:      Height:        Intake/Output Summary (Last 24 hours) at 08/30/2021 1113 Last data filed at 08/30/2021 0915 Gross per 24 hour  Intake 1300 ml  Output 600 ml  Net 700 ml    Filed Weights   08/27/21 1818 08/28/21 1140  Weight: 101 kg 98.1  kg    Examination:  General exam: Appears calm and comfortable Respiratory system: Clear to auscultation. Respiratory effort normal. Cardiovascular system: S1 & S2 heard,  RRR. Gastrointestinal system: Abdomen is nondistended, soft and nontender. No organomegaly or masses felt. Normal bowel sounds heard. Central nervous system: Alert and oriented. No focal neurological deficits. Musculoskeletal: No edema. No calf tenderness Skin: No cyanosis. No rashes Psychiatry: Judgement and insight appear normal. Mood & affect appropriate.     Data Reviewed: I have personally reviewed following labs and imaging studies  CBC Lab Results  Component Value Date   WBC 6.7 08/30/2021   RBC 4.39 08/30/2021   HGB 13.8 08/30/2021   HCT 41.8 08/30/2021   MCV 95.2 08/30/2021   MCH 31.4 08/30/2021   PLT 119 (L) 08/30/2021   MCHC 33.0 08/30/2021   RDW 17.2 (H) 08/30/2021   LYMPHSABS 0.9 08/30/2021   MONOABS 0.6 08/30/2021   EOSABS 0.1 08/30/2021   BASOSABS 0.0 62/70/3500     Last metabolic panel Lab Results  Component Value Date   NA 135 08/30/2021   K 4.1 08/30/2021   CL 97 (L) 08/30/2021   CO2 27 08/30/2021   BUN 37 (H) 08/30/2021   CREATININE 1.68 (H) 08/30/2021   GLUCOSE 245 (H) 08/30/2021   GFRNONAA 40 (L) 08/30/2021   GFRAA 48 (L) 02/23/2018   CALCIUM 8.5 (L) 08/30/2021   PHOS 2.7 08/28/2021   PROT 6.6 08/30/2021   ALBUMIN 3.5 08/30/2021   BILITOT 1.2 08/30/2021   ALKPHOS 61 08/30/2021   AST 17 08/30/2021   ALT 22 08/30/2021   ANIONGAP 11 08/30/2021    CBG (last 3)  Recent Labs    08/29/21 2137 08/30/21 0506 08/30/21 0736  GLUCAP 176* 240* 229*      GFR: Estimated Creatinine Clearance: 39.8 mL/min (A) (by C-G formula based on SCr of 1.68 mg/dL (H)).  Coagulation Profile: No results for input(s): INR, PROTIME in the last 168 hours.  Recent Results (from the past 240 hour(s))  Blood culture (routine x 2)     Status: None (Preliminary result)   Collection Time: 08/27/21  6:33 PM   Specimen: BLOOD LEFT FOREARM  Result Value Ref Range Status   Specimen Description BLOOD LEFT FOREARM  Final   Special Requests   Final    BOTTLES DRAWN  AEROBIC AND ANAEROBIC Blood Culture adequate volume   Culture   Final    NO GROWTH 2 DAYS Performed at [facility], [address] [place], [place] [address]    Report Status PENDING  Incomplete  Blood culture (routine x 2)     Status: None (Preliminary result)   Collection Time: 08/27/21  6:33 PM   Specimen: Left Antecubital; Blood  Result Value Ref Range Status   Specimen Description LEFT ANTECUBITAL  Final   Special Requests   Final    BOTTLES DRAWN AEROBIC AND ANAEROBIC Blood Culture adequate volume   Culture   Final    NO GROWTH 2 DAYS Performed at [facility], [address] [place], [place] [address]    Report Status PENDING  Incomplete  Resp Panel by RT-PCR (Flu A&B, Covid) Nasopharyngeal Swab     Status: None   Collection Time: 08/27/21  6:38 PM   Specimen: Nasopharyngeal Swab; Nasopharyngeal(NP) swabs in vial transport medium  Result Value Ref Range Status   SARS Coronavirus 2 by RT PCR NEGATIVE NEGATIVE Final    Comment: (NOTE) SARS-CoV-2 target nucleic acids are NOT DETECTED.  The SARS-CoV-2 RNA is  generally detectable in upper respiratory specimens during the acute phase of infection. The lowest concentration of SARS-CoV-2 viral copies this assay can detect is 138 copies/mL. A negative result does not preclude SARS-Cov-2 infection and should not be used as the sole basis for treatment or other patient management decisions. A negative result may occur with  improper specimen collection/handling, submission of specimen other than nasopharyngeal swab, presence of viral mutation(s) within the areas targeted by this assay, and inadequate number of viral copies(<138 copies/mL). A negative result must be combined with clinical observations, patient history, and epidemiological information. The expected result is Negative.  Fact Sheet for Patients:  EntrepreneurPulse.com.au  Fact Sheet for Healthcare Providers:   IncredibleEmployment.be  This test is no t yet approved or cleared by the [place] FDA and  has been authorized for detection and/or diagnosis of SARS-CoV-2 by FDA under an Emergency Use Authorization (EUA). This EUA will remain  in effect (meaning this test can be used) for the duration of the COVID-19 declaration under Section 564(b)(1) of the Act, 21 U.S.C.section 360bbb-3(b)(1), unless the authorization is terminated  or revoked sooner.       Influenza A by PCR NEGATIVE NEGATIVE Final   Influenza B by PCR NEGATIVE NEGATIVE Final    Comment: (NOTE) The Xpert Xpress SARS-CoV-2/FLU/RSV plus assay is intended as an aid in the diagnosis of influenza from Nasopharyngeal swab specimens and should not be used as a sole basis for treatment. Nasal washings and aspirates are unacceptable for Xpert Xpress SARS-CoV-2/FLU/RSV testing.  Fact Sheet for Patients: EntrepreneurPulse.com.au  Fact Sheet for Healthcare Providers: IncredibleEmployment.be  This test is not yet approved or cleared by the [place] FDA and has been authorized for detection and/or diagnosis of SARS-CoV-2 by FDA under an Emergency Use Authorization (EUA). This EUA will remain in effect (meaning this test can be used) for the duration of the COVID-19 declaration under Section 564(b)(1) of the Act, 21 U.S.C. section 360bbb-3(b)(1), unless the authorization is terminated or revoked.  Performed at [facility], [address], [place], [place] [address]   Urine Culture     Status: Abnormal   Collection Time: 08/28/21  2:14 AM   Specimen: Urine, Clean Catch  Result Value Ref Range Status   Specimen Description   Final    URINE, CLEAN CATCH Performed at [facility], [address], [place], [place] [address]    Special Requests   Final    NONE Performed at [facility], [address] [place], [place] [address]    Culture >=100,000 COLONIES/mL  ESCHERICHIA COLI (A)  Final   Report Status 08/30/2021 FINAL  Final   Organism ID, Bacteria ESCHERICHIA COLI (A)  Final      Susceptibility   Escherichia coli - MIC*    AMPICILLIN >=32 RESISTANT Resistant     CEFAZOLIN <=4 SENSITIVE Sensitive     CEFEPIME <=0.12 SENSITIVE Sensitive     CEFTRIAXONE <=0.25 SENSITIVE Sensitive     CIPROFLOXACIN <=0.25 SENSITIVE Sensitive     GENTAMICIN <=1 SENSITIVE Sensitive     IMIPENEM <=0.25 SENSITIVE Sensitive     NITROFURANTOIN <=16 SENSITIVE Sensitive     TRIMETH/SULFA <=20 SENSITIVE Sensitive     AMPICILLIN/SULBACTAM >=32 RESISTANT Resistant     PIP/TAZO <=4 SENSITIVE Sensitive     * >=100,000 COLONIES/mL ESCHERICHIA COLI         Radiology Studies: CT RENAL [place] STUDY  Result Date: 08/28/2021 CLINICAL DATA:  Hydronephrosis EXAM: CT ABDOMEN AND PELVIS WITHOUT CONTRAST TECHNIQUE: Multidetector  CT imaging of the abdomen and pelvis was performed following the standard protocol without IV contrast. COMPARISON:  Same-day renal ultrasound, CT abdomen/pelvis 05/19/2021 FINDINGS: Lower chest: The lung bases are clear. The imaged heart is unremarkable. Hepatobiliary: The liver is unremarkable. Small calcified gallstones are seen in the gallbladder neck. There is no evidence of acute cholecystitis. There is no biliary ductal dilatation. Pancreas: The pancreas demonstrates fatty atrophy, particularly involving the distal body and tail. There are no focal lesions or contour abnormalities. There is no main pancreatic ductal dilatation or peripancreatic inflammatory change. Spleen: Unremarkable. Adrenals/Urinary Tract: The adrenals are unremarkable. Multiple hypodense lesions are seen in each kidney, some of which are exophytic, consistent with multiple cysts. There is mild-to-moderate left and mild right hydroureteronephrosis. No obstructing lesion or Heavin is seen. There is mild stranding around the left ureter. There is mild symmetric bilateral perinephric  stranding, slightly increased since the prior study of 05/19/2021. The bladder is mildly distended. No stones or other lesions are seen within the bladder, within the confines of noncontrast technique. Stomach/Bowel: The stomach is unremarkable. There is no evidence of bowel obstruction. There is no abnormal bowel wall thickening or inflammatory change. There are scattered colonic diverticuli without evidence of acute diverticulitis. Vascular/Lymphatic: There is extensive calcified atherosclerotic plaque throughout the nonaneurysmal abdominal aorta. There is no abdominal or pelvic lymphadenopathy. Reproductive: The prostate is not identified and presumed surgically absent. The previously seen penile prosthesis has been removed. Other: There is no ascites or free air. Musculoskeletal: There is no acute osseous abnormality or aggressive osseous lesion. A bone island is noted in the right iliac wing. There is multilevel degenerative change throughout the lumbar spine. IMPRESSION: 1. Mild-to-moderate left and mild right hydroureteronephrosis with bilateral perinephric stranding and mild left periureteral stranding. No obstructing lesion or Regner is seen. Findings may reflect sequela of recently passed stones, though this is felt unlikely as no stones were present on the study from 05/19/2021. Stricture or chronic outlet obstruction are additional considerations. 2. Pyelonephritis can not be excluded on this noncontrast study. 3. Interval removal of the penile prosthesis since the study of 05/19/2021. 4. Diverticulosis without evidence of acute diverticulitis. 5. Cholelithiasis without evidence of acute cholecystitis. 6.  Aortic Atherosclerosis (ICD10-I70.0). Electronically Signed   By: Valetta Mole M.D.   On: 08/28/2021 16:04        Scheduled Meds:  amLODipine  5 mg Oral Daily   Chlorhexidine Gluconate Cloth  6 each Topical Daily   docusate sodium  100 mg Oral BID   insulin aspart  0-15 Units Subcutaneous TID  WC   insulin aspart  0-5 Units Subcutaneous QHS   insulin aspart  8 Units Subcutaneous TID WC   insulin glargine-yfgn  30 Units Subcutaneous Daily   mouth rinse  15 mL Mouth Rinse BID   mometasone-formoterol  2 puff Inhalation BID   pantoprazole  40 mg Oral Daily   Continuous Infusions:  sodium chloride 75 mL/hr at 08/30/21 0939   cefTRIAXone (ROCEPHIN)  IV 1 g (08/30/21 0950)     LOS: 3 days     Cordelia Poche, MD [facility] 08/30/2021, 11:13 AM  If 7PM-7AM, please contact night-coverage [email or web address]

## 2021-08-30 NOTE — Progress Notes (Signed)
Pt refused his long acting insulin SEMEGLEE because he didn't eat. Tried to educate pt on how long acting insulin works but pt refused. Pt has only had sliding scale insulin. MD notified.

## 2021-08-30 NOTE — Assessment & Plan Note (Signed)
See Sepsis secondary to UTI.

## 2021-08-30 NOTE — Progress Notes (Addendum)
Patient is dressed and has all his things packed up. Dulera inhaler could not be found in room. Asked pt if he had packed it in bag by mistake but he refuses to look and will not let RT touch bag to look. Asked pt if he wanted me to pull him another one or skip this one he said he didn't care but did not want to be charged again so 2000 dose skipped/refused. Patient is very agitated and wants to leave.

## 2021-08-30 NOTE — Progress Notes (Signed)
Pt alert and confused at times, got dressed and thinks he is in a motel. Wants to go home. Reminded he is in the hospital but he thinks he is at hotel. Doesn't want to go back to sleep, has been sitting in recliner since 0300. Urine still bloody. Will continue to monitor.

## 2021-08-31 LAB — COMPREHENSIVE METABOLIC PANEL
ALT: 21 U/L (ref 0–44)
AST: 17 U/L (ref 15–41)
Albumin: 3.3 g/dL — ABNORMAL LOW (ref 3.5–5.0)
Alkaline Phosphatase: 60 U/L (ref 38–126)
Anion gap: 8 (ref 5–15)
BUN: 30 mg/dL — ABNORMAL HIGH (ref 8–23)
CO2: 28 mmol/L (ref 22–32)
Calcium: 8.5 mg/dL — ABNORMAL LOW (ref 8.9–10.3)
Chloride: 100 mmol/L (ref 98–111)
Creatinine, Ser: 1.39 mg/dL — ABNORMAL HIGH (ref 0.61–1.24)
GFR, Estimated: 50 mL/min — ABNORMAL LOW (ref >60)
Glucose, Bld: 219 mg/dL — ABNORMAL HIGH (ref 70–99)
Potassium: 4.2 mmol/L (ref 3.5–5.1)
Sodium: 136 mmol/L (ref 135–145)
Total Bilirubin: 0.8 mg/dL (ref 0.3–1.2)
Total Protein: 6.2 g/dL — ABNORMAL LOW (ref 6.5–8.1)

## 2021-08-31 LAB — CBC WITH DIFFERENTIAL/PLATELET
Abs Immature Granulocytes: 0.02 10*3/uL (ref 0.00–0.07)
Basophils Absolute: 0 10*3/uL (ref 0.0–0.1)
Basophils Relative: 1 %
Eosinophils Absolute: 0.1 10*3/uL (ref 0.0–0.5)
Eosinophils Relative: 1 %
HCT: 41.5 % (ref 39.0–52.0)
Hemoglobin: 13.5 g/dL (ref 13.0–17.0)
Immature Granulocytes: 0 %
Lymphocytes Relative: 20 %
Lymphs Abs: 1 10*3/uL (ref 0.7–4.0)
MCH: 31.4 pg (ref 26.0–34.0)
MCHC: 32.5 g/dL (ref 30.0–36.0)
MCV: 96.5 fL (ref 80.0–100.0)
Monocytes Absolute: 0.4 10*3/uL (ref 0.1–1.0)
Monocytes Relative: 9 %
Neutro Abs: 3.4 10*3/uL (ref 1.7–7.7)
Neutrophils Relative %: 69 %
Platelets: 118 10*3/uL — ABNORMAL LOW (ref 150–400)
RBC: 4.3 MIL/uL (ref 4.22–5.81)
RDW: 16.9 % — ABNORMAL HIGH (ref 11.5–15.5)
WBC: 4.9 10*3/uL (ref 4.0–10.5)
nRBC: 0 % (ref 0.0–0.2)

## 2021-08-31 LAB — MAGNESIUM: Magnesium: 1.9 mg/dL (ref 1.7–2.4)

## 2021-08-31 LAB — GLUCOSE, CAPILLARY
Glucose-Capillary: 181 mg/dL — ABNORMAL HIGH (ref 70–99)
Glucose-Capillary: 211 mg/dL — ABNORMAL HIGH (ref 70–99)

## 2021-08-31 MED ORDER — CEFADROXIL 500 MG PO CAPS: 500.0000 mg | ORAL_CAPSULE | Freq: Two times a day (BID) | ORAL | 0 refills | Status: DC

## 2021-08-31 NOTE — Progress Notes (Signed)
OT Cancellation Note  Patient Details Name: ONDRE SALVETTI MRN: [id number] DOB: 03-Apr-1935   Cancelled Treatment:    Reason Eval/Treat Not Completed: OT screened, no needs identified, will sign off. Nursing reports pt independently dresses himself and walks around room.   Rico Massar OT, MOT  Larey Seat 08/31/2021, 9:33 AM

## 2021-08-31 NOTE — Progress Notes (Signed)
Nursing Discharge Note   Admit Date: 08/27/2021  Discharge date: 08/31/2021  [name] is to be discharged home per MD order.  AVS completed. Reviewed with patient at bedside. Highlighted copy provided for patient to take home.  Patient able to verbalize understanding of discharge instructions. PIV removed. Patient stable upon discharge.  Wheeled to [employer] by unit Nurse Tech to meet wife for discharge.    Discharge Instructions      [name],  You were in the hospital with a urine/kidney infection. You also had retention of your urine in your bladder requiring a foley catheter. Please follow-up with the urologist and your primary care physician. Please continue your antibiotics.    Allergies as of 08/31/2021       Reactions   Bee Venom Anaphylaxis   Adhesive [tape] Hives   Ciprofloxacin    Unknown    Codeine    Unknown    Iodine    Unknown    Latex    Unknown    Povidone-iodine    Unknown    Simvastatin    Unknown         Medication List     STOP taking these medications    HYDROcodone-acetaminophen 5-325 MG tablet Commonly known as: NORCO/VICODIN       TAKE these medications    acetaminophen 325 MG tablet Commonly known as: TYLENOL Take 650 mg by mouth every 4 (four) hours as needed for mild pain or moderate pain. Take 2 tablets 1 hour prior to Rituxan treatment.   albuterol 108 (90 Base) MCG/ACT inhaler Commonly known as: ProAir HFA Inhale 2 puffs into the lungs at bedtime as needed for wheezing or shortness of breath.   amLODipine 5 MG tablet Commonly known as: NORVASC Take 1 tablet (5 mg total) by mouth daily.   budesonide-formoterol 160-4.5 MCG/ACT inhaler Commonly known as: SYMBICORT Inhale 2 puffs into the lungs 2 (two) times daily.   cefadroxil 500 MG capsule Commonly known as: DURICEF Take 1 capsule (500 mg total) by mouth 2 (two) times daily for 6 days. Notes to patient: This is an oral antibiotic, it starts today  08/31/2021   docusate sodium 100 MG capsule Commonly known as: COLACE Take 200 mg by mouth daily as needed for mild constipation.   fluticasone 50 MCG/ACT nasal spray Commonly known as: FLONASE Place 1 spray into both nostrils as needed for allergies or rhinitis.   gabapentin 300 MG capsule Commonly known as: NEURONTIN Take 1 cap in AM and 2 caps at night (300AM, 600PM)   glipiZIDE 10 MG tablet Commonly known as: GLUCOTROL Take 10 mg by mouth 2 (two) times daily before a meal.   hydrocortisone 2.5 % cream Apply 1 application topically as needed (itching).   insulin lispro 100 UNIT/ML cartridge Commonly known as: HUMALOG Inject 1-10 Units into the skin as directed. On sliding scale when eating a big meal   ipratropium 0.06 % nasal spray Commonly known as: ATROVENT Place 2 sprays into both nostrils 2 (two) times daily.   meclizine 25 MG tablet Commonly known as: ANTIVERT Take 25 mg by mouth 2 (two) times daily as needed for dizziness.   pantoprazole 40 MG tablet Commonly known as: PROTONIX TAKE 1 TABLET BY MOUTH 2 TIMES DAILY BEFORE A MEAL. What changed: See the new instructions.   propranolol 80 MG 24 hr capsule Commonly known as: INNOPRAN XL Take 1 capsule (80 mg total) by mouth at bedtime.   temazepam 15 MG  capsule Commonly known as: RESTORIL Take 15 mg by mouth at bedtime as needed for sleep.   testosterone cypionate 200 MG/ML injection Commonly known as: DEPOTESTOSTERONE CYPIONATE Inject 200 mg into the muscle every 14 (fourteen) days.   Toujeo SoloStar 300 UNIT/ML Solostar Pen Generic drug: insulin glargine (1 Unit Dial) Inject 25 Units into the skin every morning. What changed: how much to take        Discharge Instructions/ Education: Discharge instructions given to patient with verbalized understanding. Discharge education completed with patient including: follow up instructions, medication list, discharge activities, and limitations if indicated.  Patient able to verbalize understanding, all questions fully answered. Patient instructed to return to Emergency Department, call 911, or call MD for any changes in condition.  Patient escorted via wheelchair to lobby and discharged home via private automobile.

## 2021-08-31 NOTE — Discharge Summary (Signed)
Physician Discharge Summary  [name] FBP:[id number] DOB: 1935/03/18 DOA: 08/27/2021  PCP: Brandon Squibb, MD  Admit date: 08/27/2021 Discharge date: 08/31/2021  Admitted From: Home Disposition: Home  Recommendations for Outpatient Follow-up:  Follow up with PCP in 1 week Follow up with urology for voiding trial in 1 week Please obtain BMP/CBC in one week Please follow up on the following pending results: None  Home Health: PT Equipment/Devices: None  Discharge Condition: Stable CODE STATUS: Full code Diet recommendation: Carb modified   Brief/Interim Summary:  Admission HPI written by Brandon Hoit, DO   HPI: [name] is a 86 y.o. male with medical history significant for hyperlipidemia, type 2 diabetes mellitus, hypertension, chronic kidney disease, CAD, B12 deficiency, mantle cell lymphoma of the colon, GERD, PVD, prostate CA, OSA not on CPAP, s/p COVID infection and treatment who presents to the emergency department via EMS due to generalized body aches which started few hours PTA.  Patient was unable to provide history due to altered mental status.  History was obtained from ED physician and ED medical record.  Per report, patient was not feeling well and endorsed same sensation during his last UTI.  EMS was activated and family states that patient has been having recurrent UTIs. It was noted that patient was admitted at [facility] from 9/12-9/20 due to severe sepsis secondary to E. coli bacteremia.   Hospital course:  * Sepsis secondary to UTI [facility]) Present on admission. Started empirically on Ceftriaxone. Urine culture significant for E. Coli with sensitivities pending. CT renal with evidence of possible pyelonephritis. Associated gross hematuria. Hematuria has resolved. Patient transitioned to Cefazolin IV and finally to Cefadroxil PO for discharge to complete 10 days of treatment.  Acute pyelonephritis See Sepsis secondary to UTI.  Tremors of  nervous system Patient is on propranolol as an outpatient which was held on admission. Resume on discharge.  Obesity (BMI 30.0-34.9) Body mass index is 30.15 kg/m.  Asthma Asymptomatic. Continue albuterol.  Type 2 diabetes mellitus with chronic kidney disease, with long-term current use of insulin (HCC) Hemoglobin A1C of 8.6%. Patient is on Humalog sliding scale and glipizide as an outpatient. He is also prescribed Toujeo 25 units daily, but is taking 80 units daily. Patient started on Semglee 25 units daily (starting 12/7) and SSI inpatient. Resume  Home prescribed dose.  Hydronephrosis Left sided. Likely related to bladder outlet obstruction. Foley catheter placed per urology recommendations. CT renal study without definitive evidence of kidney [place]. Urology recommend continued foley catheter and voiding trial in 1 week.  Peripheral polyneuropathy Gabapentin held secondary to renal function. Resume on discharge.  Acute renal failure superimposed on stage 3b chronic kidney disease ([place]) Most recent creatinine baseline of 1.3 from September 22. Creatinine of 2.06 on admission secondary to hydronephrosis. Initial improvement; now slightly worsened. Patient with associated nausea and decreased oral intake requiring initiation of IV fluids. Creatinine improved with IV fluids and patient now back to baseline.  Thrombocytopenia, unspecified ([place]) Patient with a chronic component, but with an acute component in setting of infection. No hemorrhaging. Improved and is stable.  Mantle cell lymphoma ([place]) Prior history.  GERD (gastroesophageal reflux disease) Continue Protonix.  Hypomagnesemia-resolved as of 08/29/2021 Magnesium replaced.   Discharge Diagnoses:  Principal Problem:   Sepsis secondary to UTI [facility]) Active Problems:   GERD (gastroesophageal reflux disease)   OSA (obstructive sleep apnea)   Mantle cell lymphoma (HCC)   Prostate cancer (HCC)   Thrombocytopenia, unspecified  ([place])  Acute renal failure superimposed on stage 3b chronic kidney disease ([place])   Peripheral polyneuropathy   Hydronephrosis   Type 2 diabetes mellitus with chronic kidney disease, with long-term current use of insulin (HCC)   Asthma   Obesity (BMI 30.0-34.9)   Tremors of nervous system   Acute pyelonephritis    Discharge Instructions   Allergies as of 08/31/2021       Reactions   Bee Venom Anaphylaxis   Adhesive [tape] Hives   Ciprofloxacin    Unknown    Codeine    Unknown    Iodine    Unknown    Latex    Unknown    Povidone-iodine    Unknown    Simvastatin    Unknown         Medication List     STOP taking these medications    HYDROcodone-acetaminophen 5-325 MG tablet Commonly known as: NORCO/VICODIN       TAKE these medications    acetaminophen 325 MG tablet Commonly known as: TYLENOL Take 650 mg by mouth every 4 (four) hours as needed for mild pain or moderate pain. Take 2 tablets 1 hour prior to Rituxan treatment.   albuterol 108 (90 Base) MCG/ACT inhaler Commonly known as: ProAir HFA Inhale 2 puffs into the lungs at bedtime as needed for wheezing or shortness of breath.   amLODipine 5 MG tablet Commonly known as: NORVASC Take 1 tablet (5 mg total) by mouth daily.   budesonide-formoterol 160-4.5 MCG/ACT inhaler Commonly known as: SYMBICORT Inhale 2 puffs into the lungs 2 (two) times daily.   cefadroxil 500 MG capsule Commonly known as: DURICEF Take 1 capsule (500 mg total) by mouth 2 (two) times daily for 6 days.   docusate sodium 100 MG capsule Commonly known as: COLACE Take 200 mg by mouth daily as needed for mild constipation.   fluticasone 50 MCG/ACT nasal spray Commonly known as: FLONASE Place 1 spray into both nostrils as needed for allergies or rhinitis.   gabapentin 300 MG capsule Commonly known as: NEURONTIN Take 1 cap in AM and 2 caps at night (300AM, 600PM)   glipiZIDE 10 MG tablet Commonly known as: GLUCOTROL Take 10  mg by mouth 2 (two) times daily before a meal.   hydrocortisone 2.5 % cream Apply 1 application topically as needed (itching).   insulin lispro 100 UNIT/ML cartridge Commonly known as: HUMALOG Inject 1-10 Units into the skin as directed. On sliding scale when eating a big meal   ipratropium 0.06 % nasal spray Commonly known as: ATROVENT Place 2 sprays into both nostrils 2 (two) times daily.   meclizine 25 MG tablet Commonly known as: ANTIVERT Take 25 mg by mouth 2 (two) times daily as needed for dizziness.   pantoprazole 40 MG tablet Commonly known as: PROTONIX TAKE 1 TABLET BY MOUTH 2 TIMES DAILY BEFORE A MEAL. What changed: See the new instructions.   propranolol 80 MG 24 hr capsule Commonly known as: INNOPRAN XL Take 1 capsule (80 mg total) by mouth at bedtime.   temazepam 15 MG capsule Commonly known as: RESTORIL Take 15 mg by mouth at bedtime as needed for sleep.   testosterone cypionate 200 MG/ML injection Commonly known as: DEPOTESTOSTERONE CYPIONATE Inject 200 mg into the muscle every 14 (fourteen) days.   Toujeo SoloStar 300 UNIT/ML Solostar Pen Generic drug: insulin glargine (1 Unit Dial) Inject 25 Units into the skin every morning. What changed: how much to take        Follow-up  Information     McKenzie, Candee Furbish, MD. Call in 1 week(s).   Specialty: Urology Why: Foley catheter removal/voiding trial Contact information: [address] [place] [address] [phone]         Brandon Squibb, MD. Schedule an appointment as soon as possible for a visit in 1 week(s).   Specialty: Internal Medicine Why: For hospital follow-up Contact information: [place] [facility] [address] [phone]                Allergies  Allergen Reactions   Bee Venom Anaphylaxis   Adhesive [Tape] Hives   Ciprofloxacin     Unknown    Codeine     Unknown     Iodine     Unknown     Latex     Unknown     Povidone-Iodine      Unknown     Simvastatin     Unknown      Consultations: Urology   Procedures/Studies: US RENAL  Result Date: 08/28/2021 CLINICAL DATA:  Acute kidney injury. EXAM: RENAL / URINARY TRACT ULTRASOUND COMPLETE COMPARISON:  CT chest, abdomen, and pelvis dated May 19, 2021. FINDINGS: Right Kidney: Renal measurements: 9.8 x 5.4 x 6.0 cm = volume: 167 mL. Increased echogenicity. No mass or hydronephrosis visualized. Left Kidney: Renal measurements: 12.3 x 5.7 x 6.7 cm = volume: 246 mL. Increased echogenicity. Mild to moderate hydronephrosis. Multiple simple cysts measuring up to 3.6 cm. Bladder: Appears normal for degree of bladder distention. Other: None. IMPRESSION: 1. Mild to moderate left hydronephrosis. 2. Increased renal echogenicity, consistent with medical renal disease. Electronically Signed   By: Titus Dubin M.D.   On: 08/28/2021 09:37   DG Chest Portable 1 View  Result Date: 08/27/2021 CLINICAL DATA:  Fever. EXAM: PORTABLE CHEST 1 VIEW COMPARISON:  Chest radiograph dated 06/04/2021. FINDINGS: Shallow inspiration. There is mild cardiomegaly with mild vascular congestion. No focal consolidation, pleural effusion, pneumothorax. Atherosclerotic calcification of the aorta. Degenerative changes of the spine. Lower cervical ACDF. No acute osseous pathology. IMPRESSION: Cardiomegaly with mild vascular congestion. No focal consolidation. Electronically Signed   By: Anner Crete M.D.   On: 08/27/2021 18:57   CT RENAL [place] STUDY  Result Date: 08/28/2021 CLINICAL DATA:  Hydronephrosis EXAM: CT ABDOMEN AND PELVIS WITHOUT CONTRAST TECHNIQUE: Multidetector CT imaging of the abdomen and pelvis was performed following the standard protocol without IV contrast. COMPARISON:  Same-day renal ultrasound, CT abdomen/pelvis 05/19/2021 FINDINGS: Lower chest: The lung bases are clear. The imaged heart is unremarkable. Hepatobiliary: The liver is unremarkable. Small calcified gallstones are seen in the  gallbladder neck. There is no evidence of acute cholecystitis. There is no biliary ductal dilatation. Pancreas: The pancreas demonstrates fatty atrophy, particularly involving the distal body and tail. There are no focal lesions or contour abnormalities. There is no main pancreatic ductal dilatation or peripancreatic inflammatory change. Spleen: Unremarkable. Adrenals/Urinary Tract: The adrenals are unremarkable. Multiple hypodense lesions are seen in each kidney, some of which are exophytic, consistent with multiple cysts. There is mild-to-moderate left and mild right hydroureteronephrosis. No obstructing lesion or [place] is seen. There is mild stranding around the left ureter. There is mild symmetric bilateral perinephric stranding, slightly increased since the prior study of 05/19/2021. The bladder is mildly distended. No stones or other lesions are seen within the bladder, within the confines of noncontrast technique. Stomach/Bowel: The stomach is unremarkable. There is no evidence of bowel obstruction. There is no abnormal bowel  wall thickening or inflammatory change. There are scattered colonic diverticuli without evidence of acute diverticulitis. Vascular/Lymphatic: There is extensive calcified atherosclerotic plaque throughout the nonaneurysmal abdominal aorta. There is no abdominal or pelvic lymphadenopathy. Reproductive: The prostate is not identified and presumed surgically absent. The previously seen penile prosthesis has been removed. Other: There is no ascites or free air. Musculoskeletal: There is no acute osseous abnormality or aggressive osseous lesion. A bone island is noted in the right iliac wing. There is multilevel degenerative change throughout the lumbar spine. IMPRESSION: 1. Mild-to-moderate left and mild right hydroureteronephrosis with bilateral perinephric stranding and mild left periureteral stranding. No obstructing lesion or [place] is seen. Findings may reflect sequela of recently passed  stones, though this is felt unlikely as no stones were present on the study from 05/19/2021. Stricture or chronic outlet obstruction are additional considerations. 2. Pyelonephritis can not be excluded on this noncontrast study. 3. Interval removal of the penile prosthesis since the study of 05/19/2021. 4. Diverticulosis without evidence of acute diverticulitis. 5. Cholelithiasis without evidence of acute cholecystitis. 6.  Aortic Atherosclerosis (ICD10-I70.0). Electronically Signed   By: Valetta Mole M.D.   On: 08/28/2021 16:04      Subjective: No nausea this morning. Feels well.  Discharge Exam: Vitals:   08/30/21 2119 08/31/21 0518  BP: 124/75 (!) 145/84  Pulse: 79 84  Resp: 18 16  Temp: 98.4 F (36.9 C) 98.4 F (36.9 C)  SpO2: 93% 98%   Vitals:   08/30/21 0758 08/30/21 1305 08/30/21 2119 08/31/21 0518  BP:  127/75 124/75 (!) 145/84  Pulse:  88 79 84  Resp:  16 18 16   Temp:  98.7 F (37.1 C) 98.4 F (36.9 C) 98.4 F (36.9 C)  TempSrc:  Oral Oral   SpO2: 91% 94% 93% 98%  Weight:      Height:        General: Pt is alert, awake, not in acute distress Cardiovascular: RRR, S1/S2 +, no rubs, no gallops Respiratory: CTA bilaterally, no wheezing, no rhonchi Abdominal: Soft, NT, ND, bowel sounds + Extremities: no edema, no cyanosis    The results of significant diagnostics from this hospitalization (including imaging, microbiology, ancillary and laboratory) are listed below for reference.     Microbiology: Recent Results (from the past 240 hour(s))  Blood culture (routine x 2)     Status: None (Preliminary result)   Collection Time: 08/27/21  6:33 PM   Specimen: BLOOD LEFT FOREARM  Result Value Ref Range Status   Specimen Description BLOOD LEFT FOREARM  Final   Special Requests   Final    BOTTLES DRAWN AEROBIC AND ANAEROBIC Blood Culture adequate volume   Culture   Final    NO GROWTH 4 DAYS Performed at [facility], [address] [place], [place] [address]     Report Status PENDING  Incomplete  Blood culture (routine x 2)     Status: None (Preliminary result)   Collection Time: 08/27/21  6:33 PM   Specimen: Left Antecubital; Blood  Result Value Ref Range Status   Specimen Description LEFT ANTECUBITAL  Final   Special Requests   Final    BOTTLES DRAWN AEROBIC AND ANAEROBIC Blood Culture adequate volume   Culture   Final    NO GROWTH 4 DAYS Performed at [facility], [address] [place], [place] [address]    Report Status PENDING  Incomplete  Resp Panel by RT-PCR (Flu A&B, Covid) Nasopharyngeal Swab     Status: None  Collection Time: 08/27/21  6:38 PM   Specimen: Nasopharyngeal Swab; Nasopharyngeal(NP) swabs in vial transport medium  Result Value Ref Range Status   SARS Coronavirus 2 by RT PCR NEGATIVE NEGATIVE Final    Comment: (NOTE) SARS-CoV-2 target nucleic acids are NOT DETECTED.  The SARS-CoV-2 RNA is generally detectable in upper respiratory specimens during the acute phase of infection. The lowest concentration of SARS-CoV-2 viral copies this assay can detect is 138 copies/mL. A negative result does not preclude SARS-Cov-2 infection and should not be used as the sole basis for treatment or other patient management decisions. A negative result may occur with  improper specimen collection/handling, submission of specimen other than nasopharyngeal swab, presence of viral mutation(s) within the areas targeted by this assay, and inadequate number of viral copies(<138 copies/mL). A negative result must be combined with clinical observations, patient history, and epidemiological information. The expected result is Negative.  Fact Sheet for Patients:  EntrepreneurPulse.com.au  Fact Sheet for Healthcare Providers:  IncredibleEmployment.be  This test is no t yet approved or cleared by the [place] FDA and  has been authorized for detection and/or diagnosis of SARS-CoV-2 by FDA under an  Emergency Use Authorization (EUA). This EUA will remain  in effect (meaning this test can be used) for the duration of the COVID-19 declaration under Section 564(b)(1) of the Act, 21 U.S.C.section 360bbb-3(b)(1), unless the authorization is terminated  or revoked sooner.       Influenza A by PCR NEGATIVE NEGATIVE Final   Influenza B by PCR NEGATIVE NEGATIVE Final    Comment: (NOTE) The Xpert Xpress SARS-CoV-2/FLU/RSV plus assay is intended as an aid in the diagnosis of influenza from Nasopharyngeal swab specimens and should not be used as a sole basis for treatment. Nasal washings and aspirates are unacceptable for Xpert Xpress SARS-CoV-2/FLU/RSV testing.  Fact Sheet for Patients: EntrepreneurPulse.com.au  Fact Sheet for Healthcare Providers: IncredibleEmployment.be  This test is not yet approved or cleared by the [place] FDA and has been authorized for detection and/or diagnosis of SARS-CoV-2 by FDA under an Emergency Use Authorization (EUA). This EUA will remain in effect (meaning this test can be used) for the duration of the COVID-19 declaration under Section 564(b)(1) of the Act, 21 U.S.C. section 360bbb-3(b)(1), unless the authorization is terminated or revoked.  Performed at [facility], [address], [place], [place] [address]   Urine Culture     Status: Abnormal   Collection Time: 08/28/21  2:14 AM   Specimen: Urine, Clean Catch  Result Value Ref Range Status   Specimen Description   Final    URINE, CLEAN CATCH Performed at [facility], [address] [place], [place] [address]    Special Requests   Final    NONE Performed at [facility], [address], [place], [place] [address]    Culture >=100,000 COLONIES/mL ESCHERICHIA COLI (A)  Final   Report Status 08/30/2021 FINAL  Final   Organism ID, Bacteria ESCHERICHIA COLI (A)  Final      Susceptibility   Escherichia coli - MIC*    AMPICILLIN >=32 RESISTANT Resistant      CEFAZOLIN <=4 SENSITIVE Sensitive     CEFEPIME <=0.12 SENSITIVE Sensitive     CEFTRIAXONE <=0.25 SENSITIVE Sensitive     CIPROFLOXACIN <=0.25 SENSITIVE Sensitive     GENTAMICIN <=1 SENSITIVE Sensitive     IMIPENEM <=0.25 SENSITIVE Sensitive     NITROFURANTOIN <=16 SENSITIVE Sensitive     TRIMETH/SULFA <=20 SENSITIVE Sensitive     AMPICILLIN/SULBACTAM >=  32 RESISTANT Resistant     PIP/TAZO <=4 SENSITIVE Sensitive     * >=100,000 COLONIES/mL ESCHERICHIA COLI     Labs: BNP (last 3 results) Recent Labs    06/04/21 2013 08/28/21 0430  BNP 250.0* 903.0*   Basic Metabolic Panel: Recent Labs  Lab 08/27/21 1836 08/28/21 0430 08/29/21 0517 08/30/21 0518 08/31/21 0540  NA 134* 135 138 135 136  K 5.0 4.2 4.0 4.1 4.2  CL 97* 102 102 97* 100  CO2 28 24 24 27 28   GLUCOSE 407* 416* 166* 245* 219*  BUN 37* 38* 35* 37* 30*  CREATININE 2.06* 1.83* 1.54* 1.68* 1.39*  CALCIUM 8.8* 8.1* 8.5* 8.5* 8.5*  MG  --  1.5* 2.0 1.8 1.9  PHOS  --  2.7  --   --   --    Liver Function Tests: Recent Labs  Lab 08/27/21 1836 08/28/21 0430 08/29/21 0517 08/30/21 0518 08/31/21 0540  AST 20 16 16 17 17   ALT 29 24 23 22 21   ALKPHOS 67 57 58 61 60  BILITOT 1.2 1.2 1.3* 1.2 0.8  PROT 7.0 6.0* 6.3* 6.6 6.2*  ALBUMIN 3.9 3.4* 3.4* 3.5 3.3*   No results for input(s): LIPASE, AMYLASE in the last 168 hours. No results for input(s): AMMONIA in the last 168 hours. CBC: Recent Labs  Lab 08/27/21 1836 08/28/21 0430 08/29/21 0517 08/30/21 0518 08/31/21 0540  WBC 4.5 6.3 5.1 6.7 4.9  NEUTROABS 3.5  --  3.8 5.2 3.4  HGB 14.5 13.5 13.8 13.8 13.5  HCT 44.9 40.8 42.8 41.8 41.5  MCV 96.4 95.6 97.3 95.2 96.5  PLT 98* 92* 96* 119* 118*   Cardiac Enzymes: No results for input(s): CKTOTAL, CKMB, CKMBINDEX, TROPONINI in the last 168 hours. BNP: Invalid input(s): POCBNP CBG: Recent Labs  Lab 08/30/21 1113 08/30/21 1219 08/30/21 1602 08/30/21 2120 08/31/21 0754  GLUCAP 228* 215* 213* 181* 211*    D-Dimer No results for input(s): DDIMER in the last 72 hours. Hgb A1c No results for input(s): HGBA1C in the last 72 hours. Lipid Profile No results for input(s): CHOL, HDL, LDLCALC, TRIG, CHOLHDL, LDLDIRECT in the last 72 hours. Thyroid function studies No results for input(s): TSH, T4TOTAL, T3FREE, THYROIDAB in the last 72 hours.  Invalid input(s): FREET3 Anemia work up No results for input(s): VITAMINB12, FOLATE, FERRITIN, TIBC, IRON, RETICCTPCT in the last 72 hours. Urinalysis    Component Value Date/Time   COLORURINE YELLOW 08/27/2021 2017   APPEARANCEUR HAZY (A) 08/27/2021 2017   LABSPEC 1.020 08/27/2021 2017   PHURINE 5.5 08/27/2021 2017   GLUCOSEU >=500 (A) 08/27/2021 2017   HGBUR LARGE (A) 08/27/2021 2017   BILIRUBINUR NEGATIVE 08/27/2021 2017   BILIRUBINUR negative 05/04/2021 [place] 08/27/2021 2017   PROTEINUR 30 (A) 08/27/2021 2017   UROBILINOGEN 1.0 05/04/2021 1702   UROBILINOGEN 0.2 06/30/2015 1552   NITRITE POSITIVE (A) 08/27/2021 2017   LEUKOCYTESUR SMALL (A) 08/27/2021 2017   Sepsis Labs Invalid input(s): PROCALCITONIN,  WBC,  LACTICIDVEN Microbiology Recent Results (from the past 240 hour(s))  Blood culture (routine x 2)     Status: None (Preliminary result)   Collection Time: 08/27/21  6:33 PM   Specimen: BLOOD LEFT FOREARM  Result Value Ref Range Status   Specimen Description BLOOD LEFT FOREARM  Final   Special Requests   Final    BOTTLES DRAWN AEROBIC AND ANAEROBIC Blood Culture adequate volume   Culture   Final    NO GROWTH 4 DAYS Performed  at [facility], [address], [place], [place] [address]    Report Status PENDING  Incomplete  Blood culture (routine x 2)     Status: None (Preliminary result)   Collection Time: 08/27/21  6:33 PM   Specimen: Left Antecubital; Blood  Result Value Ref Range Status   Specimen Description LEFT ANTECUBITAL  Final   Special Requests   Final    BOTTLES DRAWN AEROBIC AND ANAEROBIC Blood  Culture adequate volume   Culture   Final    NO GROWTH 4 DAYS Performed at [facility], [address] [place], [place] [address]    Report Status PENDING  Incomplete  Resp Panel by RT-PCR (Flu A&B, Covid) Nasopharyngeal Swab     Status: None   Collection Time: 08/27/21  6:38 PM   Specimen: Nasopharyngeal Swab; Nasopharyngeal(NP) swabs in vial transport medium  Result Value Ref Range Status   SARS Coronavirus 2 by RT PCR NEGATIVE NEGATIVE Final    Comment: (NOTE) SARS-CoV-2 target nucleic acids are NOT DETECTED.  The SARS-CoV-2 RNA is generally detectable in upper respiratory specimens during the acute phase of infection. The lowest concentration of SARS-CoV-2 viral copies this assay can detect is 138 copies/mL. A negative result does not preclude SARS-Cov-2 infection and should not be used as the sole basis for treatment or other patient management decisions. A negative result may occur with  improper specimen collection/handling, submission of specimen other than nasopharyngeal swab, presence of viral mutation(s) within the areas targeted by this assay, and inadequate number of viral copies(<138 copies/mL). A negative result must be combined with clinical observations, patient history, and epidemiological information. The expected result is Negative.  Fact Sheet for Patients:  EntrepreneurPulse.com.au  Fact Sheet for Healthcare Providers:  IncredibleEmployment.be  This test is no t yet approved or cleared by the [place] FDA and  has been authorized for detection and/or diagnosis of SARS-CoV-2 by FDA under an Emergency Use Authorization (EUA). This EUA will remain  in effect (meaning this test can be used) for the duration of the COVID-19 declaration under Section 564(b)(1) of the Act, 21 U.S.C.section 360bbb-3(b)(1), unless the authorization is terminated  or revoked sooner.       Influenza A by PCR NEGATIVE NEGATIVE Final    Influenza B by PCR NEGATIVE NEGATIVE Final    Comment: (NOTE) The Xpert Xpress SARS-CoV-2/FLU/RSV plus assay is intended as an aid in the diagnosis of influenza from Nasopharyngeal swab specimens and should not be used as a sole basis for treatment. Nasal washings and aspirates are unacceptable for Xpert Xpress SARS-CoV-2/FLU/RSV testing.  Fact Sheet for Patients: EntrepreneurPulse.com.au  Fact Sheet for Healthcare Providers: IncredibleEmployment.be  This test is not yet approved or cleared by the [place] FDA and has been authorized for detection and/or diagnosis of SARS-CoV-2 by FDA under an Emergency Use Authorization (EUA). This EUA will remain in effect (meaning this test can be used) for the duration of the COVID-19 declaration under Section 564(b)(1) of the Act, 21 U.S.C. section 360bbb-3(b)(1), unless the authorization is terminated or revoked.  Performed at [facility], [address] [place], [place] [address]   Urine Culture     Status: Abnormal   Collection Time: 08/28/21  2:14 AM   Specimen: Urine, Clean Catch  Result Value Ref Range Status   Specimen Description   Final    URINE, CLEAN CATCH Performed at [facility], [address], [place], [place] [address]    Special Requests   Final  NONE Performed at [facility], [address] [place], [place] [address]    Culture >=100,000 COLONIES/mL ESCHERICHIA COLI (A)  Final   Report Status 08/30/2021 FINAL  Final   Organism ID, Bacteria ESCHERICHIA COLI (A)  Final      Susceptibility   Escherichia coli - MIC*    AMPICILLIN >=32 RESISTANT Resistant     CEFAZOLIN <=4 SENSITIVE Sensitive     CEFEPIME <=0.12 SENSITIVE Sensitive     CEFTRIAXONE <=0.25 SENSITIVE Sensitive     CIPROFLOXACIN <=0.25 SENSITIVE Sensitive     GENTAMICIN <=1 SENSITIVE Sensitive     IMIPENEM <=0.25 SENSITIVE Sensitive     NITROFURANTOIN <=16 SENSITIVE Sensitive     TRIMETH/SULFA <=20 SENSITIVE  Sensitive     AMPICILLIN/SULBACTAM >=32 RESISTANT Resistant     PIP/TAZO <=4 SENSITIVE Sensitive     * >=100,000 COLONIES/mL ESCHERICHIA COLI     Time coordinating discharge: 35 minutes  SIGNED:   Cordelia Poche, MD [facility] 08/31/2021, 8:47 AM

## 2021-08-31 NOTE — Progress Notes (Signed)
Patient declined [place] MDI at [phone] stating he" did not need and inhaler and was ready to leave." He did not appear in any distress BBS clear and RA SpO2 92%.

## 2021-08-31 NOTE — Discharge Instructions (Signed)
[name],  You were in the hospital with a urine/kidney infection. You also had retention of your urine in your bladder requiring a foley catheter. Please follow-up with the urologist and your primary care physician. Please continue your antibiotics.

## 2021-09-01 LAB — CULTURE, BLOOD (ROUTINE X 2)
Culture: NO GROWTH
Culture: NO GROWTH
Special Requests: ADEQUATE
Special Requests: ADEQUATE

## 2021-09-03 ENCOUNTER — Observation Stay (HOSPITAL_COMMUNITY)
Admission: EM | Admit: 2021-09-03 | Discharge: 2021-09-04 | Disposition: A | Discharge status: Home / Self Care | Payer: Medicare Other | Attending: Internal Medicine | Admitting: Internal Medicine

## 2021-09-03 ENCOUNTER — Emergency Department (HOSPITAL_COMMUNITY): Payer: Medicare Other

## 2021-09-03 ENCOUNTER — Other Ambulatory Visit: Payer: Self-pay

## 2021-09-03 ENCOUNTER — Encounter (HOSPITAL_COMMUNITY): Payer: Self-pay

## 2021-09-03 DIAGNOSIS — E1121 Type 2 diabetes mellitus with diabetic nephropathy: Secondary | ICD-10-CM | POA: Diagnosis not present

## 2021-09-03 DIAGNOSIS — I10 Essential (primary) hypertension: Secondary | ICD-10-CM | POA: Diagnosis not present

## 2021-09-03 DIAGNOSIS — Z79899 Other long term (current) drug therapy: Secondary | ICD-10-CM | POA: Insufficient documentation

## 2021-09-03 DIAGNOSIS — N390 Urinary tract infection, site not specified: Secondary | ICD-10-CM | POA: Diagnosis not present

## 2021-09-03 DIAGNOSIS — E1122 Type 2 diabetes mellitus with diabetic chronic kidney disease: Secondary | ICD-10-CM | POA: Diagnosis not present

## 2021-09-03 DIAGNOSIS — Z20822 Contact with and (suspected) exposure to covid-19: Secondary | ICD-10-CM | POA: Insufficient documentation

## 2021-09-03 DIAGNOSIS — B9620 Unspecified Escherichia coli [E. coli] as the cause of diseases classified elsewhere: Secondary | ICD-10-CM

## 2021-09-03 DIAGNOSIS — I2510 Atherosclerotic heart disease of native coronary artery without angina pectoris: Secondary | ICD-10-CM | POA: Diagnosis not present

## 2021-09-03 DIAGNOSIS — R739 Hyperglycemia, unspecified: Secondary | ICD-10-CM

## 2021-09-03 DIAGNOSIS — E1165 Type 2 diabetes mellitus with hyperglycemia: Secondary | ICD-10-CM | POA: Diagnosis not present

## 2021-09-03 DIAGNOSIS — D649 Anemia, unspecified: Secondary | ICD-10-CM

## 2021-09-03 DIAGNOSIS — G9341 Metabolic encephalopathy: Secondary | ICD-10-CM | POA: Diagnosis not present

## 2021-09-03 DIAGNOSIS — Z91040 Latex allergy status: Secondary | ICD-10-CM | POA: Insufficient documentation

## 2021-09-03 DIAGNOSIS — Z8616 Personal history of COVID-19: Secondary | ICD-10-CM | POA: Insufficient documentation

## 2021-09-03 DIAGNOSIS — R4182 Altered mental status, unspecified: Secondary | ICD-10-CM

## 2021-09-03 DIAGNOSIS — Z85038 Personal history of other malignant neoplasm of large intestine: Secondary | ICD-10-CM | POA: Insufficient documentation

## 2021-09-03 DIAGNOSIS — Z8546 Personal history of malignant neoplasm of prostate: Secondary | ICD-10-CM | POA: Diagnosis not present

## 2021-09-03 DIAGNOSIS — N1832 Chronic kidney disease, stage 3b: Secondary | ICD-10-CM | POA: Diagnosis not present

## 2021-09-03 DIAGNOSIS — J9811 Atelectasis: Secondary | ICD-10-CM | POA: Diagnosis not present

## 2021-09-03 DIAGNOSIS — I129 Hypertensive chronic kidney disease with stage 1 through stage 4 chronic kidney disease, or unspecified chronic kidney disease: Secondary | ICD-10-CM | POA: Diagnosis not present

## 2021-09-03 DIAGNOSIS — Z794 Long term (current) use of insulin: Secondary | ICD-10-CM | POA: Diagnosis not present

## 2021-09-03 DIAGNOSIS — R410 Disorientation, unspecified: Secondary | ICD-10-CM | POA: Diagnosis not present

## 2021-09-03 DIAGNOSIS — N289 Disorder of kidney and ureter, unspecified: Secondary | ICD-10-CM

## 2021-09-03 LAB — COMPREHENSIVE METABOLIC PANEL
ALT: 20 U/L (ref 0–44)
AST: 17 U/L (ref 15–41)
Albumin: 3.5 g/dL (ref 3.5–5.0)
Alkaline Phosphatase: 69 U/L (ref 38–126)
Anion gap: 8 (ref 5–15)
BUN: 32 mg/dL — ABNORMAL HIGH (ref 8–23)
CO2: 26 mmol/L (ref 22–32)
Calcium: 8.5 mg/dL — ABNORMAL LOW (ref 8.9–10.3)
Chloride: 96 mmol/L — ABNORMAL LOW (ref 98–111)
Creatinine, Ser: 1.59 mg/dL — ABNORMAL HIGH (ref 0.61–1.24)
GFR, Estimated: 43 mL/min — ABNORMAL LOW (ref >60)
Glucose, Bld: 616 mg/dL (ref 70–99)
Potassium: 4.7 mmol/L (ref 3.5–5.1)
Sodium: 130 mmol/L — ABNORMAL LOW (ref 135–145)
Total Bilirubin: 0.7 mg/dL (ref 0.3–1.2)
Total Protein: 6.2 g/dL — ABNORMAL LOW (ref 6.5–8.1)

## 2021-09-03 LAB — VITAMIN B12: Vitamin B-12: 1011 pg/mL — ABNORMAL HIGH (ref 180–914)

## 2021-09-03 LAB — GLUCOSE, CAPILLARY
Glucose-Capillary: 111 mg/dL — ABNORMAL HIGH (ref 70–99)
Glucose-Capillary: 176 mg/dL — ABNORMAL HIGH (ref 70–99)
Glucose-Capillary: 201 mg/dL — ABNORMAL HIGH (ref 70–99)
Glucose-Capillary: 207 mg/dL — ABNORMAL HIGH (ref 70–99)
Glucose-Capillary: 209 mg/dL — ABNORMAL HIGH (ref 70–99)

## 2021-09-03 LAB — CBC WITH DIFFERENTIAL/PLATELET
Abs Immature Granulocytes: 0.16 10*3/uL — ABNORMAL HIGH (ref 0.00–0.07)
Basophils Absolute: 0 10*3/uL (ref 0.0–0.1)
Basophils Relative: 1 %
Eosinophils Absolute: 0.1 10*3/uL (ref 0.0–0.5)
Eosinophils Relative: 2 %
HCT: 39 % (ref 39.0–52.0)
Hemoglobin: 12.7 g/dL — ABNORMAL LOW (ref 13.0–17.0)
Immature Granulocytes: 4 %
Lymphocytes Relative: 20 %
Lymphs Abs: 0.8 10*3/uL (ref 0.7–4.0)
MCH: 31.2 pg (ref 26.0–34.0)
MCHC: 32.6 g/dL (ref 30.0–36.0)
MCV: 95.8 fL (ref 80.0–100.0)
Monocytes Absolute: 0.3 10*3/uL (ref 0.1–1.0)
Monocytes Relative: 7 %
Neutro Abs: 2.6 10*3/uL (ref 1.7–7.7)
Neutrophils Relative %: 66 %
Platelets: 130 10*3/uL — ABNORMAL LOW (ref 150–400)
RBC: 4.07 MIL/uL — ABNORMAL LOW (ref 4.22–5.81)
RDW: 16.4 % — ABNORMAL HIGH (ref 11.5–15.5)
WBC: 4 10*3/uL (ref 4.0–10.5)
nRBC: 0 % (ref 0.0–0.2)

## 2021-09-03 LAB — RESP PANEL BY RT-PCR (FLU A&B, COVID) ARPGX2
Influenza A by PCR: NEGATIVE
Influenza B by PCR: NEGATIVE
SARS Coronavirus 2 by RT PCR: NEGATIVE

## 2021-09-03 LAB — CBG MONITORING, ED
Glucose-Capillary: 586 mg/dL (ref 70–99)
Glucose-Capillary: 403 mg/dL — ABNORMAL HIGH (ref 70–99)
Glucose-Capillary: 262 mg/dL — ABNORMAL HIGH (ref 70–99)

## 2021-09-03 LAB — URINALYSIS, ROUTINE W REFLEX MICROSCOPIC
Bilirubin Urine: NEGATIVE
Glucose, UA: >=500 mg/dL — AB
Ketones, ur: NEGATIVE mg/dL
Leukocytes,Ua: NEGATIVE
Nitrite: NEGATIVE
Protein, ur: NEGATIVE mg/dL
Specific Gravity, Urine: 1.01 (ref 1.005–1.030)
pH: 5 (ref 5.0–8.0)

## 2021-09-03 LAB — URINALYSIS, MICROSCOPIC (REFLEX)
Bacteria, UA: NONE SEEN
Squamous Epithelial / HPF: NONE SEEN (ref 0–5)
WBC, UA: NONE SEEN WBC/hpf (ref 0–5)

## 2021-09-03 LAB — BLOOD GAS, ARTERIAL
Acid-Base Excess: 3.2 mmol/L — ABNORMAL HIGH (ref 0.0–2.0)
Bicarbonate: 26.4 mmol/L (ref 20.0–28.0)
Drawn by: 27733
FIO2: 21
O2 Saturation: 92.1 %
Patient temperature: 36.3
pCO2 arterial: 49.2 mmHg — ABNORMAL HIGH (ref 32.0–48.0)
pH, Arterial: 7.372 (ref 7.350–7.450)
pO2, Arterial: 66.5 mmHg — ABNORMAL LOW (ref 83.0–108.0)

## 2021-09-03 LAB — VITAMIN D 25 HYDROXY (VIT D DEFICIENCY, FRACTURES): Vit D, 25-Hydroxy: 43.98 ng/mL (ref 30–100)

## 2021-09-03 LAB — TSH: TSH: 4.963 u[IU]/mL — ABNORMAL HIGH (ref 0.350–4.500)

## 2021-09-03 MED ORDER — PANTOPRAZOLE SODIUM 40 MG PO TBEC
40.0000 mg | DELAYED_RELEASE_TABLET | Freq: Two times a day (BID) | ORAL | Status: DC
  Administered 2021-09-03 – 2021-09-04 (×3): 40 mg via ORAL
  Filled 2021-09-03 (×2): qty 1

## 2021-09-03 MED ORDER — IPRATROPIUM-ALBUTEROL 0.5-2.5 (3) MG/3ML IN SOLN: 3.0000 mL | Freq: Four times a day (QID) | RESPIRATORY_TRACT | Status: DC | PRN

## 2021-09-03 MED ORDER — HEPARIN SODIUM (PORCINE) 5000 UNIT/ML IJ SOLN
5000.0000 [IU] | Freq: Three times a day (TID) | INTRAMUSCULAR | Status: DC
  Administered 2021-09-03 – 2021-09-04 (×3): 5000 [IU] via SUBCUTANEOUS
  Filled 2021-09-03 (×3): qty 1

## 2021-09-03 MED ORDER — INSULIN STARTER KIT- SYRINGES (ENGLISH)
1.0000 | Freq: Once | Status: AC
  Administered 2021-09-03: 1
  Filled 2021-09-03: qty 1

## 2021-09-03 MED ORDER — INSULIN ASPART 100 UNIT/ML IJ SOLN: 0.0000 [IU] | Freq: Every day | INTRAMUSCULAR | Status: DC

## 2021-09-03 MED ORDER — ONDANSETRON HCL 4 MG PO TABS: 4.0000 mg | ORAL_TABLET | Freq: Four times a day (QID) | ORAL | Status: DC | PRN

## 2021-09-03 MED ORDER — INSULIN ASPART 100 UNIT/ML IJ SOLN
0.0000 [IU] | Freq: Three times a day (TID) | INTRAMUSCULAR | Status: DC
  Administered 2021-09-03 (×2): 5 [IU] via SUBCUTANEOUS
  Administered 2021-09-04: 2 [IU] via SUBCUTANEOUS

## 2021-09-03 MED ORDER — FLUTICASONE FUROATE-VILANTEROL 200-25 MCG/ACT IN AEPB
1.0000 | INHALATION_SPRAY | Freq: Every day | RESPIRATORY_TRACT | Status: DC
  Administered 2021-09-04: 1 via RESPIRATORY_TRACT
  Filled 2021-09-03: qty 28

## 2021-09-03 MED ORDER — INSULIN ASPART 100 UNIT/ML IV SOLN
10.0000 [IU] | Freq: Once | INTRAVENOUS | Status: AC
  Administered 2021-09-03: 10 [IU] via INTRAVENOUS

## 2021-09-03 MED ORDER — INSULIN ASPART 100 UNIT/ML IJ SOLN
0.0000 [IU] | Freq: Three times a day (TID) | INTRAMUSCULAR | Status: DC
  Administered 2021-09-03: 8 [IU] via SUBCUTANEOUS

## 2021-09-03 MED ORDER — ONDANSETRON HCL 4 MG/2ML IJ SOLN: 4.0000 mg | Freq: Four times a day (QID) | INTRAMUSCULAR | Status: DC | PRN

## 2021-09-03 MED ORDER — INSULIN STARTER KIT- PEN NEEDLES (ENGLISH)
1.0000 | Freq: Once | Status: AC
  Administered 2021-09-03: 1
  Filled 2021-09-03: qty 1

## 2021-09-03 MED ORDER — FLUTICASONE PROPIONATE 50 MCG/ACT NA SUSP: 1.0000 | Freq: Every day | NASAL | Status: DC | PRN

## 2021-09-03 MED ORDER — LACTATED RINGERS IV BOLUS
1000.0000 mL | Freq: Once | INTRAVENOUS | Status: AC
  Administered 2021-09-03: 1000 mL via INTRAVENOUS

## 2021-09-03 MED ORDER — CEFADROXIL 500 MG PO CAPS
500.0000 mg | ORAL_CAPSULE | Freq: Two times a day (BID) | ORAL | Status: DC
  Administered 2021-09-03 – 2021-09-04 (×3): 500 mg via ORAL
  Filled 2021-09-03 (×5): qty 1

## 2021-09-03 MED ORDER — SODIUM CHLORIDE 0.9 % IV SOLN: INTRAVENOUS | Status: DC

## 2021-09-03 MED ORDER — PROPRANOLOL HCL ER BEADS 80 MG PO CP24
80.0000 mg | ORAL_CAPSULE | Freq: Every day | ORAL | Status: DC
  Administered 2021-09-03: 80 mg via ORAL
  Filled 2021-09-03 (×2): qty 1

## 2021-09-03 MED ORDER — MECLIZINE HCL 12.5 MG PO TABS: 25.0000 mg | ORAL_TABLET | Freq: Two times a day (BID) | ORAL | Status: DC | PRN

## 2021-09-03 MED ORDER — INSULIN DETEMIR 100 UNIT/ML ~~LOC~~ SOLN
30.0000 [IU] | Freq: Every day | SUBCUTANEOUS | Status: DC
  Administered 2021-09-03: 30 [IU] via SUBCUTANEOUS
  Filled 2021-09-03 (×2): qty 0.3

## 2021-09-03 MED ORDER — ACETAMINOPHEN 650 MG RE SUPP: 650.0000 mg | Freq: Four times a day (QID) | RECTAL | Status: DC | PRN

## 2021-09-03 MED ORDER — GABAPENTIN 300 MG PO CAPS
300.0000 mg | ORAL_CAPSULE | Freq: Every day | ORAL | Status: DC
  Administered 2021-09-04: 300 mg via ORAL
  Filled 2021-09-03 (×2): qty 1

## 2021-09-03 MED ORDER — ACETAMINOPHEN 325 MG PO TABS: 650.0000 mg | ORAL_TABLET | Freq: Four times a day (QID) | ORAL | Status: DC | PRN

## 2021-09-03 MED ORDER — AMLODIPINE BESYLATE 5 MG PO TABS
5.0000 mg | ORAL_TABLET | Freq: Every day | ORAL | Status: DC
  Administered 2021-09-03 – 2021-09-04 (×2): 5 mg via ORAL
  Filled 2021-09-03 (×2): qty 1

## 2021-09-03 MED ORDER — GABAPENTIN 300 MG PO CAPS
600.0000 mg | ORAL_CAPSULE | Freq: Every day | ORAL | Status: DC
  Administered 2021-09-03: 600 mg via ORAL
  Filled 2021-09-03: qty 2

## 2021-09-03 NOTE — ED Triage Notes (Signed)
Pt arrives POV from home for AMS. Per pts wife she woke up this morning and found pt confused. She thought his blood sugar had dropped, she was unable to check his sugar so she gave him orange juice and a jelly sandwich. Wife states pt is at baseline at this time.

## 2021-09-03 NOTE — ED Provider Notes (Signed)
[facility] EMERGENCY DEPARTMENT Provider Note   CSN: [id number] Arrival date & time: 09/03/21  1505     History Chief Complaint  Patient presents with   Altered Mental Status    [name] is a 86 y.o. male.  The history is provided by the spouse. The history is limited by the condition of the patient (Altered mental status).  Altered Mental Status He has history of hypertension, diabetes, hyperlipidemia, coronary artery disease, colon cancer, chronic kidney disease, prostate cancer and is brought in by his wife because of altered mental status.  He apparently woke up and opened the door in the house.  The house alarm went off awaking his wife.  He was not able to explain why he had open the door.  His wife states that it is not unusual for him to get up at night and be very confused because of hypoglycemia, but he normally is sweaty when that happens.  This time he was not sweaty.  She was unable to use his glucose meter but assumed he was hypoglycemic and gave him some orange juice and a jelly sandwich.  He has improved, but is not back to his baseline.   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, anomalous circumflex - no PCIs   Essential hypertension, benign    GERD (gastroesophageal reflux disease)    Mixed hyperlipidemia    Obstructive sleep apnea    does not use   Prostate cancer ([place])    PVD (peripheral vascular disease) ([place])    Type 2 diabetes mellitus ([place])     Patient Active Problem List   Diagnosis Date Noted   Acute pyelonephritis 08/30/2021   Sepsis secondary to UTI ([place]) 08/28/2021   Hyperglycemia due to diabetes mellitus ([place]) 08/28/2021   Obesity (BMI 30.0-34.9) 08/28/2021   Tremors of nervous system 08/28/2021   Insomnia 08/28/2021   UTI (urinary tract infection) 08/27/2021   COVID-19 virus infection  06/04/2021   Imbalance 05/06/2021   Hydronephrosis 05/06/2021   Acute respiratory failure with hypoxia ([place]) 05/06/2021   Asthma 05/06/2021   Type 2 diabetes mellitus with chronic kidney disease, with long-term current use of insulin (HCC)    Proteinuria 03/14/2021   Diabetic peripheral neuropathy ([place]) 03/07/2021   Asymptomatic cholelithiasis 07/06/2019   Peripheral polyneuropathy 01/16/2019   Multiple pulmonary nodules 08/01/2016   Upper airway cough syndrome 07/30/2016   Chronic asthma vs UACS  07/11/2016   Dupuytren's contracture 06/06/2016   Mild cognitive impairment 01/22/2016   Essential tremor 10/17/2015   Acute renal failure superimposed on stage 3b chronic kidney disease ([place]) 07/02/2015   AKI (acute kidney injury) ([place]) 06/30/2015   Acute kidney injury ([place]) 06/30/2015   Status post total right knee replacement 03/29/2014   B12 deficiency 02/07/2014   Diverticulosis of colon with hemorrhage 01/01/2014   Thrombocytopenia, unspecified ([place]) 01/01/2014   Acute blood loss anemia 01/01/2014   Leukopenia 12/31/2013   Lower GI bleed 12/30/2013   Diverticulitis 12/30/2013   Abdominal pain, left lower quadrant 11/16/2013   Abdominal pain, unspecified site 11/16/2013   Mantle cell lymphoma ([place]) 09/07/2013   Prostate cancer ([place]) 09/07/2013   Proctitis, 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   H/O ulcerative colitis 04/12/2013   OSA (obstructive sleep apnea) 08/15/2012   VASOMOTOR RHINITIS 01/23/2010   GERD (gastroesophageal reflux disease) 01/23/2010   SOB (shortness  of breath) 01/01/2010   Mixed hyperlipidemia 11/23/2009   Hypertension 11/23/2009   Coronary atherosclerosis of native coronary artery 02/18/2009    Past Surgical History:  Procedure Laterality Date   BACK SURGERY     BALLOON DILATION N/A 08/27/2013   Procedure: BALLOON DILATION;  Surgeon: Rogene Houston, MD;  [facility];  Service: Endoscopy;  Laterality: N/A;   COLON SURGERY     COLONOSCOPY N/A 12/17/2013    Procedure: COLONOSCOPY;  Surgeon: Rogene Houston, MD;  Location: [facility];  Service: Endoscopy;  Laterality: N/A;  940   COLONOSCOPY N/A 02/28/2014   Procedure: COLONOSCOPY;  Surgeon: Rogene Houston, MD;  Location[facility];  Service: Endoscopy;  Laterality: N/A;  730   COLONOSCOPY N/A 08/04/2015   Procedure: COLONOSCOPY;  Surgeon: Rogene Houston, MD;  Location[facility];  Service: Endoscopy;  Laterality: N/A;  200 - moved to 11/11 @ 2:10 - Ann to notify pt   COLONOSCOPY WITH ESOPHAGOGASTRODUODENOSCOPY (EGD) N/A 08/27/2013   Procedure: COLONOSCOPY WITH ESOPHAGOGASTRODUODENOSCOPY (EGD);  Surgeon: Rogene Houston, MD;  [facility];  Service: Endoscopy;  Laterality: N/A;  730   CYSTOSCOPY N/A 06/09/2021   Procedure: CYSTOSCOPY;  Surgeon: Lucas Mallow, MD;  Location: [facility];  Service: Urology;  Laterality: N/A;   IR REMOVAL TUN ACCESS W/ PORT W/O FL MOD SED  07/17/2018   KNEE ARTHROSCOPY Left    KNEE SURGERY Right    total knee   MALONEY DILATION N/A 08/27/2013   Procedure: MALONEY DILATION;  Surgeon: Rogene Houston, MD;  [facility];  Service: Endoscopy;  Laterality: N/A;   PORTACATH PLACEMENT Right 09/23/2012   PROSTATECTOMY     REMOVAL OF PENILE PROSTHESIS N/A 06/09/2021   Procedure: REMOVAL OF ARTIFICIAL URINARY SPHINCER; REMOVAL OF INFLATABLE PENILE PROSTHESIS;  Surgeon: Lucas Mallow, MD;  Location: [facility];  Service: Urology;  Laterality: N/A;   removal of port Right    SAVORY DILATION N/A 08/27/2013   Procedure: SAVORY DILATION;  Surgeon: Rogene Houston, MD;  [facility];  Service: Endoscopy;  Laterality: N/A;   SHOULDER SURGERY     TONSILLECTOMY     URINARY SPHINCTER IMPLANT N/A 01/23/2017   artificial urinary sphincter implant by Dr. Odis Luster   VASECTOMY         Family History  Problem Relation Age of Onset   Colon cancer Brother    Cancer Brother    Diabetes Father    Cancer Brother     Social History    Tobacco Use   Smoking status: Never   Smokeless tobacco: Never  Vaping Use   Vaping Use: Never used  Substance Use Topics   Alcohol use: No   Drug use: No    Home Medications Prior to Admission medications   Medication Sig Start Date End Date Taking? Authorizing Provider  acetaminophen (TYLENOL) 325 MG tablet Take 650 mg by mouth every 4 (four) hours as needed for mild pain or moderate pain. Take 2 tablets 1 hour prior to Rituxan treatment.    [provider]  albuterol (PROAIR HFA) 108 (90 Base) MCG/ACT inhaler Inhale 2 puffs into the lungs at bedtime as needed for wheezing or shortness of breath. 05/12/20   Tanda Rockers, MD  amLODipine (NORVASC) 5 MG tablet Take 1 tablet (5 mg total) by mouth daily. 09/12/20   Burtis Junes, NP  budesonide-formoterol (SYMBICORT) 160-4.5 MCG/ACT inhaler Inhale 2 puffs  into the lungs 2 (two) times daily. 05/12/20   Tanda Rockers, MD  cefadroxil (DURICEF) 500 MG capsule Take 1 capsule (500 mg total) by mouth 2 (two) times daily for 6 days. 08/31/21 09/06/21  Mariel Aloe, MD  docusate sodium (COLACE) 100 MG capsule Take 200 mg by mouth daily as needed for mild constipation.    [provider]  fluticasone (FLONASE) 50 MCG/ACT nasal spray Place 1 spray into both nostrils as needed for allergies or rhinitis.    [provider]  gabapentin (NEURONTIN) 300 MG capsule Take 1 cap in AM and 2 caps at night (300AM, 600PM) 02/15/21   Frann Rider, NP  glipiZIDE (GLUCOTROL) 10 MG tablet Take 10 mg by mouth 2 (two) times daily before a meal.  01/09/16   [provider]  hydrocortisone 2.5 % cream Apply 1 application topically as needed (itching).    [provider]  insulin lispro (HUMALOG) 100 UNIT/ML cartridge Inject 1-10 Units into the skin as directed. On sliding scale when eating a big meal    [provider]  ipratropium (ATROVENT) 0.06 % nasal spray Place 2 sprays into both nostrils 2 (two) times  daily.  04/15/17   [provider]  meclizine (ANTIVERT) 25 MG tablet Take 25 mg by mouth 2 (two) times daily as needed for dizziness.  05/01/17   [provider]  pantoprazole (PROTONIX) 40 MG tablet TAKE 1 TABLET BY MOUTH 2 TIMES DAILY BEFORE A MEAL. Patient taking differently: Take 40 mg by mouth 2 (two) times daily. 05/08/20 07/31/21  Harvel Quale, MD  propranolol (INNOPRAN XL) 80 MG 24 hr capsule Take 1 capsule (80 mg total) by mouth at bedtime. 08/20/21   Frann Rider, NP  temazepam (RESTORIL) 15 MG capsule Take 15 mg by mouth at bedtime as needed for sleep. 06/26/20   [provider]  testosterone cypionate (DEPOTESTOSTERONE CYPIONATE) 200 MG/ML injection Inject 200 mg into the muscle every 14 (fourteen) days.  04/29/16   [provider]  TOUJEO SOLOSTAR 300 UNIT/ML Solostar Pen Inject 25 Units into the skin every morning. Patient taking differently: Inject 80 Units into the skin every morning. 06/08/21   Johnson, Clanford L, MD  insulin aspart (NOVOLOG) 100 UNIT/ML injection Inject 20 Units into the skin 3 (three) times daily before meals.    12/13/11  [provider]  rosuvastatin (CRESTOR) 40 MG tablet Take 40 mg by mouth daily.    01/13/19  [provider]    Allergies    Bee venom, Adhesive [tape], Ciprofloxacin, Codeine, Iodine, Latex, Povidone-iodine, and Simvastatin  Review of Systems   Review of Systems  Unable to perform ROS: Mental status change   Physical Exam Updated Vital Signs BP (!) 160/96 (BP Location: Left Arm)   Pulse 66   Temp (!) 97.5 F (36.4 C) (Oral)   Resp 17   Ht 5' 11"  (1.803 m)   Wt 98.1 kg   SpO2 91%   BMI 30.15 kg/m   Physical Exam Vitals and nursing note reviewed.  86 year old male, resting comfortably and in no acute distress. Vital signs are significant for elevated blood pressure. Oxygen saturation is 91%, which is normal. Head is normocephalic and atraumatic. PERRLA, EOMI.  Oropharynx is clear. Neck is nontender and supple without adenopathy or JVD.  There are no carotid bruits. Back is nontender and there is no CVA tenderness. Lungs are clear without rales, wheezes, or rhonchi. Chest is nontender. Heart has regular rate  and rhythm without murmur. Abdomen is soft, flat, nontender. Extremities have no cyanosis or edema, full range of motion is present. Skin is warm and dry without rash. Neurologic: Awake and alert, oriented to person and place but not time.  Cranial nerves are intact, speech is normal.  Moves all extremities equally.  ED Results / Procedures / Treatments   Labs (all labs ordered are listed, but only abnormal results are displayed) Labs Reviewed  COMPREHENSIVE METABOLIC PANEL - Abnormal; Notable for the following components:      Result Value   Sodium 130 (*)    Chloride 96 (*)    Glucose, Bld 616 (*)    BUN 32 (*)    Creatinine, Ser 1.59 (*)    Calcium 8.5 (*)    Total Protein 6.2 (*)    GFR, Estimated 43 (*)    All other components within normal limits  CBC WITH DIFFERENTIAL/PLATELET - Abnormal; Notable for the following components:   RBC 4.07 (*)    Hemoglobin 12.7 (*)    RDW 16.4 (*)    Platelets 130 (*)    Abs Immature Granulocytes 0.16 (*)    All other components within normal limits  URINALYSIS, ROUTINE W REFLEX MICROSCOPIC - Abnormal; Notable for the following components:   Glucose, UA >=500 (*)    Hgb urine dipstick TRACE (*)    All other components within normal limits  CBG MONITORING, ED - Abnormal; Notable for the following components:   Glucose-Capillary 586 (*)    All other components within normal limits  CBG MONITORING, ED - Abnormal; Notable for the following components:   Glucose-Capillary 403 (*)    All other components within normal limits  CBG MONITORING, ED - Abnormal; Notable for the following components:   Glucose-Capillary 262 (*)    All other components within normal limits  RESP PANEL BY RT-PCR (FLU  A&B, COVID) ARPGX2  URINALYSIS, MICROSCOPIC (REFLEX)    EKG EKG Interpretation  Date/Time:  Monday September 03 2021 03:28:05 EST Ventricular Rate:  63 PR Interval:  179 QRS Duration: 96 QT Interval:  385 QTC Calculation: 395 R Axis:   -30 Text Interpretation: Sinus rhythm Left axis deviation Borderline low voltage, extremity leads Abnormal R-wave progression, early transition When compared with ECG of 08/27/2021, HEART RATE has decreased Confirmed by Delora Fuel ([id number]) on 09/03/2021 3:32:06 AM  Radiology DG Chest 2 View  Result Date: 09/03/2021 CLINICAL DATA:  86 year old male with altered mental status. History of colon and prostate cancer. EXAM: CHEST - 2 VIEW COMPARISON:  Chest CT 05/19/2021. Recent CT Abdomen and Pelvis 08/28/2021. Portable chest 08/27/2021 and earlier. FINDINGS: Upright AP and lateral views of the chest. Continued low lung volumes. Stable mild elevation of the right hemidiaphragm. Normal cardiac size and mediastinal contours. No pneumothorax, pulmonary edema or pleural effusion. Mild lung base atelectasis suspected. No consolidation. Cervical ACDF. No acute osseous abnormality identified. Negative visible bowel gas. IMPRESSION: Low lung volumes with mild atelectasis. Electronically Signed   By: Genevie Ann M.D.   On: 09/03/2021 04:15   CT Head Wo Contrast  Result Date: 09/03/2021 CLINICAL DATA:  86 year old male with altered mental status, confused. Possible hypoglycemia. EXAM: CT HEAD WITHOUT CONTRAST TECHNIQUE: Contiguous axial images were obtained from the base of the skull through the vertex without intravenous contrast. COMPARISON:  Brain MRI 01/30/2016.  Head CT 06/04/2021. FINDINGS: Brain: Stable cerebral volume. No midline shift, ventriculomegaly, mass effect, evidence of mass lesion, intracranial hemorrhage or evidence of cortically based acute infarction.  Mild for age patchy bilateral white matter hypodensity is stable with otherwise preserved gray-white matter  differentiation. Vascular: Mild Calcified atherosclerosis at the skull base. No suspicious intracranial vascular hyperdensity. Skull: No acute osseous abnormality identified. Sinuses/Orbits: Mild bilateral paranasal sinus disease has not significantly changed. Other Visualized paranasal sinuses and mastoids are stable and well aerated. Other: No acute orbit or scalp soft tissue finding. IMPRESSION: 1. No acute intracranial abnormality. Stable mild for age chronic white matter changes. 2. Mild maxillary sinus disease, stable since September. Electronically Signed   By: Genevie Ann M.D.   On: 09/03/2021 04:32    Procedures Procedures  CRITICAL CARE Performed by: Delora Fuel Total critical care time: 50 minutes Critical care time was exclusive of separately billable procedures and treating other patients. Critical care was necessary to treat or prevent imminent or life-threatening deterioration. Critical care was time spent personally by me on the following activities: development of treatment plan with patient and/or surrogate as well as nursing, discussions with consultants, evaluation of patient's response to treatment, examination of patient, obtaining history from patient or surrogate, ordering and performing treatments and interventions, ordering and review of laboratory studies, ordering and review of radiographic studies, pulse oximetry and re-evaluation of patient's condition.  Medications Ordered in ED Medications  insulin aspart (novoLOG) injection 0-15 Units (8 Units Subcutaneous Given 09/03/21 0648)  insulin aspart (novoLOG) injection 0-5 Units (has no administration in time range)  insulin aspart (novoLOG) injection 10 Units (10 Units Intravenous Given 09/03/21 0439)  lactated ringers bolus 1,000 mL (0 mLs Intravenous Stopped 09/03/21 0530)    ED Course  I have reviewed the triage vital signs and the nursing notes.  Pertinent labs & imaging results that were available during my care of  the patient were reviewed by me and considered in my medical decision making (see chart for details).   MDM Rules/Calculators/A&P                         Altered mental status which is improving, possible episode of hypoglycemia.  We will do work-up in the ED including looking for occult infection by checking chest x-ray and urinalysis, will send for CT of head.  Old records are reviewed, and he was just discharged from the hospital 3 days ago after being admitted for urinary tract infection.  ED work-up is significant only for hyperglycemia.  There is mild hyponatremia consistent with degree of hyperglycemia.  CO2 is normal and anion gap is normal, no evidence of ketoacidosis or significant hyperosmolar state.  Chest x-ray shows no acute process and CT of head shows no acute process.  Mild anemia is present which is new, but hemoglobin is only slightly decreased from baseline.  Urinalysis shows no evidence of infection.  He was given IV fluids and insulin and glucose has come down to slightly over 400.  Because of persistent mental status change, decision is made to admit him.  Case is discussed with Dr. Clearence Ped of [facility], who agrees to admit the patient.  Final Clinical Impression(s) / ED Diagnoses Final diagnoses:  Altered mental status, unspecified altered mental status type  Hyperglycemia  Renal insufficiency    Rx / DC Orders ED Discharge Orders     None        Delora Fuel, MD 16/60/63 [id number]

## 2021-09-03 NOTE — Progress Notes (Signed)
Diabetes education pamphlet given to patient and wife.

## 2021-09-03 NOTE — H&P (Signed)
History and Physical    [name] QJF:[id number] DOB: 1935-04-25 DOA: 09/03/2021  PCP: Celene Squibb, MD   Patient coming from: home  I have personally briefly reviewed patient's old medical records in [place]  Chief Complaint: Altered mental status/confusion.  HPI: [name] is a 86 y.o. male with medical history significant of B12 deficiency, hypertension, hyperlipidemia, chronic kidney disease stage IIIb, coronary tree disease, type 2 diabetes with nephropathy/neuropathy, history of colon cancer, history of prostate cancer, gastroesophageal reflux disease and obstructive sleep apnea not on BiPAP; who had been recently admitted to the hospital secondary to UTI and after appropriate treatment discharged home on oral antibiotics.  While at home patient experience an episode of confusion waking up early in the morning and being disoriented and opening the door of his house; alarm went off and wife wake up finding the patient confused.  Patient's wife thought that the patient had an episode of low blood sugar and gave in a jelly sandwich and orange juice; they could not check his blood sugar level with glucometer at home.  There has not been any fever, nausea, vomiting, chest pain, hematuria, melena, hematochezia or focal weakness.  Patient's symptoms started to improve son after intervention patient was brought to the hospital for further evaluation and management. Of note, patient wife reported that at times patient's woke up in the middle of the night confused and diaphoretic when his sugar is low.  There was no diaphoresis or sweating this time.  Patient is vaccinated and boosted against COVID; COVID PCR check in the ED within normal limits.  ED Course: CT head without acute intracranial abnormalities, patient was afebrile, urinalysis stable and not reporting signs of infection currently; normal CBC and a stable electrolytes and renal function.  Patient blood sugar was  found to be in the 600 range after insulin provided down into the 400s.  As patient mentation was now back to baseline dry hospital was contacted to place in the hospital for observation and further evaluation.  Review of Systems: As per HPI otherwise all other systems reviewed and are negative.  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, anomalous circumflex - no PCIs   Essential hypertension, benign    GERD (gastroesophageal reflux disease)    Mixed hyperlipidemia    Obstructive sleep apnea    does not use   Prostate cancer ([place])    PVD (peripheral vascular disease) ([place])    Type 2 diabetes mellitus ([place])     Past Surgical History:  Procedure Laterality Date   BACK SURGERY     BALLOON DILATION N/A 08/27/2013   Procedure: BALLOON DILATION;  Surgeon: Rogene Houston, MD;  [facility];  Service: Endoscopy;  Laterality: N/A;   COLON SURGERY     COLONOSCOPY N/A 12/17/2013   Procedure: COLONOSCOPY;  Surgeon: Rogene Houston, MD;  [facility];  Service: Endoscopy;  Laterality: N/A;  940   COLONOSCOPY N/A 02/28/2014   Procedure: COLONOSCOPY;  Surgeon: Rogene Houston, MD;  [facility];  Service: Endoscopy;  Laterality: N/A;  730   COLONOSCOPY N/A 08/04/2015   Procedure: COLONOSCOPY;  Surgeon: Rogene Houston, MD;  [facility];  Service: Endoscopy;  Laterality: N/A;  200 - moved to 11/11 @ 2:10 - Ann to notify  pt   COLONOSCOPY WITH ESOPHAGOGASTRODUODENOSCOPY (EGD) N/A 08/27/2013   Procedure: COLONOSCOPY WITH ESOPHAGOGASTRODUODENOSCOPY (EGD);  Surgeon: Rogene Houston, MD;  [facility];  Service: Endoscopy;  Laterality: N/A;  730   CYSTOSCOPY N/A 06/09/2021   Procedure: CYSTOSCOPY;  Surgeon: Lucas Mallow, MD;  Location: [facility];  Service: Urology;  Laterality: N/A;   IR REMOVAL TUN  ACCESS W/ PORT W/O FL MOD SED  07/17/2018   KNEE ARTHROSCOPY Left    KNEE SURGERY Right    total knee   MALONEY DILATION N/A 08/27/2013   Procedure: MALONEY DILATION;  Surgeon: Rogene Houston, MD;  [facility];  Service: Endoscopy;  Laterality: N/A;   PORTACATH PLACEMENT Right 09/23/2012   PROSTATECTOMY     REMOVAL OF PENILE PROSTHESIS N/A 06/09/2021   Procedure: REMOVAL OF ARTIFICIAL URINARY SPHINCER; REMOVAL OF INFLATABLE PENILE PROSTHESIS;  Surgeon: Lucas Mallow, MD;  Location[facility];  Service: Urology;  Laterality: N/A;   removal of port Right    SAVORY DILATION N/A 08/27/2013   Procedure: SAVORY DILATION;  Surgeon: Rogene Houston, MD;  [facility];  Service: Endoscopy;  Laterality: N/A;   SHOULDER SURGERY     TONSILLECTOMY     URINARY SPHINCTER IMPLANT N/A 01/23/2017   artificial urinary sphincter implant by Dr. Odis Luster   VASECTOMY      Social History  reports that he has never smoked. He has never used smokeless tobacco. He reports that he does not drink alcohol and does not use drugs.  Allergies  Allergen Reactions   Bee Venom Anaphylaxis   Adhesive [Tape] Hives   Ciprofloxacin     Unknown    Codeine     Unknown     Iodine     Unknown     Latex     Unknown     Povidone-Iodine     Unknown     Simvastatin     Unknown      Family History  Problem Relation Age of Onset   Colon cancer Brother    Cancer Brother    Diabetes Father    Cancer Brother     Prior to Admission medications   Medication Sig Start Date End Date Taking? Authorizing Provider  acetaminophen (TYLENOL) 325 MG tablet Take 650 mg by mouth every 4 (four) hours as needed for mild pain or moderate pain. Take 2 tablets 1 hour prior to Rituxan treatment.    [provider]  albuterol (PROAIR HFA) 108 (90 Base) MCG/ACT inhaler Inhale 2 puffs into the lungs at bedtime as needed for wheezing or shortness of breath. 05/12/20   Tanda Rockers, MD  amLODipine  (NORVASC) 5 MG tablet Take 1 tablet (5 mg total) by mouth daily. 09/12/20   Burtis Junes, NP  budesonide-formoterol (SYMBICORT) 160-4.5 MCG/ACT inhaler Inhale 2 puffs into the lungs 2 (two) times daily. 05/12/20   Tanda Rockers, MD  cefadroxil (DURICEF) 500 MG capsule Take 1 capsule (500 mg total) by mouth 2 (two) times daily for 6 days. 08/31/21 09/06/21  Mariel Aloe, MD  docusate sodium (COLACE) 100 MG capsule Take 200 mg by mouth daily as needed for mild constipation.    [provider]  fluticasone (FLONASE) 50 MCG/ACT nasal spray Place 1 spray into both nostrils as needed for allergies or rhinitis.    [provider]  gabapentin (NEURONTIN) 300 MG capsule Take 1 cap in AM and 2 caps at night (  300AM, 600PM) 02/15/21   Frann Rider, NP  glipiZIDE (GLUCOTROL) 10 MG tablet Take 10 mg by mouth 2 (two) times daily before a meal.  01/09/16   [provider]  hydrocortisone 2.5 % cream Apply 1 application topically as needed (itching).    [provider]  insulin lispro (HUMALOG) 100 UNIT/ML cartridge Inject 1-10 Units into the skin as directed. On sliding scale when eating a big meal    [provider]  ipratropium (ATROVENT) 0.06 % nasal spray Place 2 sprays into both nostrils 2 (two) times daily.  04/15/17   [provider]  meclizine (ANTIVERT) 25 MG tablet Take 25 mg by mouth 2 (two) times daily as needed for dizziness.  05/01/17   [provider]  pantoprazole (PROTONIX) 40 MG tablet TAKE 1 TABLET BY MOUTH 2 TIMES DAILY BEFORE A MEAL. Patient taking differently: Take 40 mg by mouth 2 (two) times daily. 05/08/20 07/31/21  Harvel Quale, MD  propranolol (INNOPRAN XL) 80 MG 24 hr capsule Take 1 capsule (80 mg total) by mouth at bedtime. 08/20/21   Frann Rider, NP  temazepam (RESTORIL) 15 MG capsule Take 15 mg by mouth at bedtime as needed for sleep. 06/26/20   [provider]  testosterone cypionate  (DEPOTESTOSTERONE CYPIONATE) 200 MG/ML injection Inject 200 mg into the muscle every 14 (fourteen) days.  04/29/16   [provider]  TOUJEO SOLOSTAR 300 UNIT/ML Solostar Pen Inject 25 Units into the skin every morning. Patient taking differently: Inject 80 Units into the skin every morning. 06/08/21   Johnson, Clanford L, MD  insulin aspart (NOVOLOG) 100 UNIT/ML injection Inject 20 Units into the skin 3 (three) times daily before meals.    12/13/11  [provider]  rosuvastatin (CRESTOR) 40 MG tablet Take 40 mg by mouth daily.    01/13/19  [provider]    Physical Exam: Vitals:   09/03/21 0700 09/03/21 0726 09/03/21 0741 09/03/21 0741  BP: 140/89   (!) 155/88  Pulse: (!) 41   (!) 57  Resp: (!) 22   15  Temp:  (!) 97.4 F (36.3 C)  (!) 97.3 F (36.3 C)  TempSrc:  Oral  Oral  SpO2: 96%   97%  Weight:      Height:   5' 9"  (1.753 m)     Constitutional: In no major distress; oriented to person and place; disoriented to time.  Afebrile.  No chest pain, no nausea or vomiting. Vitals:   09/03/21 0700 09/03/21 0726 09/03/21 0741 09/03/21 0741  BP: 140/89   (!) 155/88  Pulse: (!) 41   (!) 57  Resp: (!) 22   15  Temp:  (!) 97.4 F (36.3 C)  (!) 97.3 F (36.3 C)  TempSrc:  Oral  Oral  SpO2: 96%   97%  Weight:      Height:   5' 9"  (1.753 m)    Eyes: PERRL, lids and conjunctivae normal, no icterus. ENMT: Mucous membranes are moist. Posterior pharynx clear of any exudate or lesions. Neck: normal, supple, no masses, no thyromegaly Respiratory: clear to auscultation bilaterally, no wheezing, no crackles. Normal respiratory effort. No accessory muscle use.  Cardiovascular: Regular rate and rhythm, no murmurs / rubs / gallops. No extremity edema. 2+ pedal pulses. No carotid bruits.  Abdomen: no tenderness, no masses palpated. No hepatosplenomegaly. Bowel sounds positive.  Musculoskeletal: no clubbing / cyanosis. No joint deformity upper and lower extremities. Good  ROM, no contractures. Normal muscle tone.  Skin: no rashes, no petechiae. Neurologic: CN 2-12 grossly intact. Sensation intact, DTR normal. Strength 5/5 in all 4.  Psychiatric: Normal judgment and insight. Alert and oriented x 3. Normal mood.   Labs on Admission: I have personally reviewed following labs and imaging studies  CBC: Recent Labs  Lab 08/27/21 1836 08/28/21 0430 08/29/21 0517 08/30/21 0518 08/31/21 0540 09/03/21 0342  WBC 4.5 6.3 5.1 6.7 4.9 4.0  NEUTROABS 3.5  --  3.8 5.2 3.4 2.6  HGB 14.5 13.5 13.8 13.8 13.5 12.7*  HCT 44.9 40.8 42.8 41.8 41.5 39.0  MCV 96.4 95.6 97.3 95.2 96.5 95.8  PLT 98* 92* 96* 119* 118* 130*    Basic Metabolic Panel: Recent Labs  Lab 08/28/21 0430 08/29/21 0517 08/30/21 0518 08/31/21 0540 09/03/21 0342  NA 135 138 135 136 130*  K 4.2 4.0 4.1 4.2 4.7  CL 102 102 97* 100 96*  CO2 24 24 27 28 26   GLUCOSE 416* 166* 245* 219* 616*  BUN 38* 35* 37* 30* 32*  CREATININE 1.83* 1.54* 1.68* 1.39* 1.59*  CALCIUM 8.1* 8.5* 8.5* 8.5* 8.5*  MG 1.5* 2.0 1.8 1.9  --   PHOS 2.7  --   --   --   --     GFR: Estimated Creatinine Clearance: 40.7 mL/min (A) (by C-G formula based on SCr of 1.59 mg/dL (H)).  Liver Function Tests: Recent Labs  Lab 08/28/21 0430 08/29/21 0517 08/30/21 0518 08/31/21 0540 09/03/21 0342  AST 16 16 17 17 17   ALT 24 23 22 21 20   ALKPHOS 57 58 61 60 69  BILITOT 1.2 1.3* 1.2 0.8 0.7  PROT 6.0* 6.3* 6.6 6.2* 6.2*  ALBUMIN 3.4* 3.4* 3.5 3.3* 3.5    Urine analysis:    Component Value Date/Time   COLORURINE YELLOW 09/03/2021 0515   APPEARANCEUR CLEAR 09/03/2021 0515   LABSPEC 1.010 09/03/2021 0515   PHURINE 5.0 09/03/2021 0515   GLUCOSEU >=500 (A) 09/03/2021 0515   HGBUR TRACE (A) 09/03/2021 0515   BILIRUBINUR NEGATIVE 09/03/2021 0515   BILIRUBINUR negative 05/04/2021 1702   KETONESUR NEGATIVE 09/03/2021 0515   PROTEINUR NEGATIVE 09/03/2021 0515   UROBILINOGEN 1.0 05/04/2021 1702   UROBILINOGEN 0.2  06/30/2015 1552   NITRITE NEGATIVE 09/03/2021 0515   LEUKOCYTESUR NEGATIVE 09/03/2021 0515    Radiological Exams on Admission: DG Chest 2 View  Result Date: 09/03/2021 CLINICAL DATA:  86 year old male with altered mental status. History of colon and prostate cancer. EXAM: CHEST - 2 VIEW COMPARISON:  Chest CT 05/19/2021. Recent CT Abdomen and Pelvis 08/28/2021. Portable chest 08/27/2021 and earlier. FINDINGS: Upright AP and lateral views of the chest. Continued low lung volumes. Stable mild elevation of the right hemidiaphragm. Normal cardiac size and mediastinal contours. No pneumothorax, pulmonary edema or pleural effusion. Mild lung base atelectasis suspected. No consolidation. Cervical ACDF. No acute osseous abnormality identified. Negative visible bowel gas. IMPRESSION: Low lung volumes with mild atelectasis. Electronically Signed   By: Genevie Ann M.D.   On: 09/03/2021 04:15   CT Head Wo Contrast  Result Date: 09/03/2021 CLINICAL DATA:  86 year old male with altered mental status, confused. Possible hypoglycemia. EXAM: CT HEAD WITHOUT CONTRAST TECHNIQUE: Contiguous axial images were obtained from the base of the skull through the vertex without intravenous contrast. COMPARISON:  Brain MRI 01/30/2016.  Head CT 06/04/2021. FINDINGS: Brain: Stable cerebral volume. No midline shift, ventriculomegaly, mass effect, evidence of mass lesion, intracranial hemorrhage or evidence of cortically based acute infarction. Mild for age patchy bilateral white matter  hypodensity is stable with otherwise preserved gray-white matter differentiation. Vascular: Mild Calcified atherosclerosis at the skull base. No suspicious intracranial vascular hyperdensity. Skull: No acute osseous abnormality identified. Sinuses/Orbits: Mild bilateral paranasal sinus disease has not significantly changed. Other Visualized paranasal sinuses and mastoids are stable and well aerated. Other: No acute orbit or scalp soft tissue finding.  IMPRESSION: 1. No acute intracranial abnormality. Stable mild for age chronic white matter changes. 2. Mild maxillary sinus disease, stable since September. Electronically Signed   By: Genevie Ann M.D.   On: 09/03/2021 04:32    EKG: Independently reviewed.  Sinus rhythm; no acute ischemic changes.  Assessment/Plan 1-acute metabolic encephalopathy -Appears to be secondary to blood sugar abnormalities; unknown if this is been at home was associated with low blood sugar; in presentation to the emergency department after intervention provided by his wife blood sugar significantly elevated. -Continue IV fluids, supportive care and follow CBGs fluctuation. -Will check TSH, B12 and vitamin D level. -Patient will complete antibiotics as previously prescribed from recent UTI. -Consult reorientation and follow clinical response. -Minimize medications that can alter mentation.  2-recent E. coli UTI -Complete antibiotics as previously prescribed -Culture data has been reviewed and appropriate. -Maintain adequate hydration.  3-gastroesophageal reflux disease -Continue PPI  4-hypertension -Continue propanolol and Norvasc  5-history of COPD/obstructive sleep apnea -No complaining of shortness of breath and there was no presence of wheezing on examination. -will check ABG -Continue home bronchodilator management  6-chronic kidney disease a stage IIIb/neuropathy -Renal function at baseline -Continue home dose of Neurontin -Minimize nephrotoxic agents -Follow renal function trend.  7-type 2 diabetes -Holding oral hypoglycemic agents while inpatient -Continue the use of sliding scale insulin and Levemir -Follow CBGs fluctuation.  DVT prophylaxis: Heparin Code Status:   Full code Family Communication:  Wife at bedside Disposition Plan:   Patient is from:  Home  Anticipated DC to:  Home  Anticipated DC date:  09/04/21  Anticipated DC barriers: Stabilization of his mentation.  Consults called:   None  Admission status:  Observation, telemetry, LOS < 2 midnights.  Severity of Illness: The appropriate patient status for this patient is OBSERVATION. Observation status is judged to be reasonable and necessary in order to provide the required intensity of service to ensure the patient's safety. The patient's presenting symptoms, physical exam findings, and initial radiographic and laboratory data in the context of their medical condition is felt to place them at decreased risk for further clinical deterioration. Furthermore, it is anticipated that the patient will be medically stable for discharge from the hospital within 2 midnights of admission.     Barton Dubois MD [facility]  How to contact the [facility] Attending or Consulting provider [place] or covering provider during after hours [place], for this patient?   Check the care team in [facility] and look for a) attending/consulting TRH provider listed and b) the [facility] team listed Log into [email or web address] and use [place]'s universal password to access. If you do not have the password, please contact the hospital operator. Locate the [facility] provider you are looking for under [facility] and page to a number that you can be directly reached. If you still have difficulty reaching the provider, please page the [facility] (Director on Call) for the Hospitalists listed on amion for assistance.  09/03/2021, 7:47 AM

## 2021-09-03 NOTE — TOC Initial Note (Addendum)
Transition of Care [facility]) - Initial/Assessment Note    Patient Details  Name: [name] MRN: [id number] Date of Birth: 1935/08/26  [facility] of [facility] CM/SW Contact:    Shade Flood, LCSW Phone Number: 09/03/2021, 11:14 AM  Clinical Narrative:                  Pt from home under observation status. Pt lives with his wife and was discharged home from [facility] four days ago. [facility] was arranged for Home RN and PT upon dc last week. Anticipating pt will dc home with resumption of HH when stable. TOC will follow.  1345: Updated by RN that pt's wife is attempting to learn pt's insulin administration/diabetes management and the RN feels she will need continued assistance at home. [place] with [facility] to inquire if they can continue to assist. Per Marjory Lies, when he reviewed pt's [facility] record, there were notes indicating that [facility] RN had attempted to visit pt yesterday but was unable to admit to [facility] because pt refused to sign consent for treatment stating he didn't need them. Marjory Lies states that if new [place] orders are entered upon dc, they will attempt again to see pt.  Updated RN on above. Will continue to follow.  Expected Discharge Plan: [place] Barriers to Discharge: Continued Medical Work up   Patient Goals and CMS Choice Patient states their goals for this hospitalization and ongoing recovery are:: go home      Expected Discharge Plan and Services Expected Discharge Plan: [place] In-house Referral: Clinical Social Work     Living arrangements for the past 2 months: Single Family Home                                      Prior Living Arrangements/Services Living arrangements for the past 2 months: Single Family Home Lives with:: Spouse Patient language and need for interpreter reviewed:: Yes        Need for Family Participation in Patient Care: Yes (Comment) Care giver support system in place?: Yes  (comment) Current home services: Home PT, Home RN Criminal Activity/Legal Involvement Pertinent to Current Situation/Hospitalization: No - Comment as needed  Activities of Daily Living Home Assistive Devices/Equipment: CBG Meter ADL Screening (condition at time of admission) Patient's cognitive ability adequate to safely complete daily activities?: No Is the patient deaf or have difficulty hearing?: No Does the patient have difficulty seeing, even when wearing glasses/contacts?: No Does the patient have difficulty concentrating, remembering, or making decisions?: No Patient able to express need for assistance with ADLs?: Yes Does the patient have difficulty dressing or bathing?: No Independently performs ADLs?: Yes (appropriate for developmental age) Does the patient have difficulty walking or climbing stairs?: No Weakness of Legs: None Weakness of Arms/Hands: None  Permission Sought/Granted                  Emotional Assessment       Orientation: : Oriented to Self Alcohol / Substance Use: Not Applicable Psych Involvement: No (comment)  Admission diagnosis:  Hyperglycemia [R73.9] Renal insufficiency [N28.9] Normochromic normocytic anemia [D64.9] Altered mental status, unspecified altered mental status type [X54.00] Acute metabolic encephalopathy [Q67.61] Patient Active Problem List   Diagnosis Date Noted   Acute metabolic encephalopathy 95/05/3266   Acute pyelonephritis 08/30/2021   Sepsis secondary to UTI ([place]) 08/28/2021   Hyperglycemia due  to diabetes mellitus ([place]) 08/28/2021   Obesity (BMI 30.0-34.9) 08/28/2021   Tremors of nervous system 08/28/2021   Insomnia 08/28/2021   UTI (urinary tract infection) 08/27/2021   COVID-19 virus infection 06/04/2021   Imbalance 05/06/2021   Hydronephrosis 05/06/2021   Acute respiratory failure with hypoxia ([place]) 05/06/2021   Asthma 05/06/2021   Type 2 diabetes mellitus with chronic kidney disease, with long-term current  use of insulin (HCC)    Proteinuria 03/14/2021   Diabetic peripheral neuropathy ([place]) 03/07/2021   Asymptomatic cholelithiasis 07/06/2019   Peripheral polyneuropathy 01/16/2019   Multiple pulmonary nodules 08/01/2016   Upper airway cough syndrome 07/30/2016   Chronic asthma vs UACS  07/11/2016   Dupuytren's contracture 06/06/2016   Mild cognitive impairment 01/22/2016   Essential tremor 10/17/2015   Acute renal failure superimposed on stage 3b chronic kidney disease ([place]) 07/02/2015   AKI (acute kidney injury) ([place]) 06/30/2015   Acute kidney injury ([place]) 06/30/2015   Status post total right knee replacement 03/29/2014   B12 deficiency 02/07/2014   Diverticulosis of colon with hemorrhage 01/01/2014   Thrombocytopenia, unspecified ([place]) 01/01/2014   Acute blood loss anemia 01/01/2014   Leukopenia 12/31/2013   Lower GI bleed 12/30/2013   Diverticulitis 12/30/2013   Abdominal pain, left lower quadrant 11/16/2013   Abdominal pain, unspecified site 11/16/2013   Mantle cell lymphoma ([place]) 09/07/2013   Prostate cancer ([place]) 09/07/2013   Proctitis, 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   H/O ulcerative colitis 04/12/2013   OSA (obstructive sleep apnea) 08/15/2012   VASOMOTOR RHINITIS 01/23/2010   GERD (gastroesophageal reflux disease) 01/23/2010   SOB (shortness of breath) 01/01/2010   Mixed hyperlipidemia 11/23/2009   Hypertension 11/23/2009   Coronary atherosclerosis of native coronary artery 02/18/2009   PCP:  Celene Squibb, MD Pharmacy:   [facility] [place] [address] [place] [address] Phone: [phone] Fax: [phone]  [place], [place] [address] [place] [address] Phone: [phone] Fax: [phone]     Social Determinants of Health (SDOH) Interventions    Readmission Risk Interventions Readmission Risk Prevention Plan 08/30/2021 06/12/2021 06/12/2021  Transportation Screening  Complete Complete -  PCP or Specialist Appt within 3-5 Days Complete - Complete  HRI or Home Care Consult Complete - Complete  Social Work Consult for Recovery Care Planning/Counseling Complete - Complete  Palliative Care Screening Not Applicable - Not Applicable  Medication Review Press photographer) Complete - Complete  Some recent data might be hidden

## 2021-09-03 NOTE — ED Notes (Signed)
Pt glucose now 403

## 2021-09-03 NOTE — Progress Notes (Signed)
Nurse attempted to educate wife at bedside with diabetic educator. Wife expressed concern and need for more assistance at home with this. Social worker informed and will place new request for [facility] to be set up at home. Patient was set up at last discharge and refused to sign consent for service when they arrived to his home. [place] agency has agreed to try again when time for discharge again.

## 2021-09-03 NOTE — Progress Notes (Signed)
Inpatient Diabetes Program Recommendations  AACE/ADA: New Consensus Statement on Inpatient Glycemic Control (2015)  Target Ranges:  Prepandial:   less than 140 mg/dL      Peak postprandial:   less than 180 mg/dL (1-2 hours)      Critically ill patients:  140 - 180 mg/dL   Lab Results  Component Value Date   GLUCAP 207 (H) 09/03/2021   HGBA1C 8.6 (H) 08/28/2021    Review of Glycemic Control  Inpatient Diabetes Program Recommendations:   Reviewed with patient's wife @ bedside multiple times with insulin pen. Wife was able to do steps  Reviewed all steps if insulin pen including attachment of needle, 2-unit air shot, dialing up dose, giving injection, removing needle, disposal of sharps, storage of unused insulin, disposal of insulin etc. Wife was able to provide successful return demonstration only with step by step coaching each time. Reviewed insulin timing and difference in long acting and short acting insulin. Wife is uncertain and cannot verbalize back which insulin is short and long acting.  Wife states she has never assisted patient with insulin and has never reviewed how to give.Patient has eyes closed while lying in bed and said he was going to give his insulin, and reviewed uncertain of his abilitiy @ discharge so attempting to train wife.  Left voicemail on daughters mobile phone for callback to discuss discharge plans for insulin administration.  Thank you, Nani Gasser. Mitchelle Sultan, RN, MSN, CDE  Diabetes Coordinator Inpatient Glycemic Control Team Team Pager [phone] (8am-5pm) 09/03/2021 2:45 PM

## 2021-09-03 NOTE — ED Notes (Signed)
Patients O2 dropped to 85 while sleeping. Placed pt on 2L for comfort.

## 2021-09-04 DIAGNOSIS — N390 Urinary tract infection, site not specified: Secondary | ICD-10-CM | POA: Diagnosis not present

## 2021-09-04 DIAGNOSIS — G9341 Metabolic encephalopathy: Secondary | ICD-10-CM | POA: Diagnosis not present

## 2021-09-04 DIAGNOSIS — N1832 Chronic kidney disease, stage 3b: Secondary | ICD-10-CM

## 2021-09-04 DIAGNOSIS — R4182 Altered mental status, unspecified: Secondary | ICD-10-CM | POA: Diagnosis not present

## 2021-09-04 DIAGNOSIS — R739 Hyperglycemia, unspecified: Secondary | ICD-10-CM | POA: Diagnosis not present

## 2021-09-04 LAB — CBC
HCT: 41 % (ref 39.0–52.0)
Hemoglobin: 13.1 g/dL (ref 13.0–17.0)
MCH: 30.8 pg (ref 26.0–34.0)
MCHC: 32 g/dL (ref 30.0–36.0)
MCV: 96.2 fL (ref 80.0–100.0)
Platelets: 150 10*3/uL (ref 150–400)
RBC: 4.26 MIL/uL (ref 4.22–5.81)
RDW: 16.3 % — ABNORMAL HIGH (ref 11.5–15.5)
WBC: 6.5 10*3/uL (ref 4.0–10.5)
nRBC: 0 % (ref 0.0–0.2)

## 2021-09-04 LAB — BASIC METABOLIC PANEL
Anion gap: 7 (ref 5–15)
BUN: 25 mg/dL — ABNORMAL HIGH (ref 8–23)
CO2: 30 mmol/L (ref 22–32)
Calcium: 8.8 mg/dL — ABNORMAL LOW (ref 8.9–10.3)
Chloride: 101 mmol/L (ref 98–111)
Creatinine, Ser: 1.41 mg/dL — ABNORMAL HIGH (ref 0.61–1.24)
GFR, Estimated: 49 mL/min — ABNORMAL LOW (ref >60)
Glucose, Bld: 144 mg/dL — ABNORMAL HIGH (ref 70–99)
Potassium: 4.1 mmol/L (ref 3.5–5.1)
Sodium: 138 mmol/L (ref 135–145)

## 2021-09-04 LAB — GLUCOSE, CAPILLARY
Glucose-Capillary: 63 mg/dL — ABNORMAL LOW (ref 70–99)
Glucose-Capillary: 113 mg/dL — ABNORMAL HIGH (ref 70–99)
Glucose-Capillary: 132 mg/dL — ABNORMAL HIGH (ref 70–99)
Glucose-Capillary: 150 mg/dL — ABNORMAL HIGH (ref 70–99)

## 2021-09-04 MED ORDER — CEFADROXIL 500 MG PO CAPS: 500.0000 mg | ORAL_CAPSULE | Freq: Two times a day (BID) | ORAL | Status: AC

## 2021-09-04 MED ORDER — METFORMIN HCL ER 750 MG PO TB24: 750.0000 mg | ORAL_TABLET | Freq: Every day | ORAL | 3 refills | Status: DC

## 2021-09-04 MED ORDER — CHLORHEXIDINE GLUCONATE CLOTH 2 % EX PADS
6.0000 | MEDICATED_PAD | Freq: Every day | CUTANEOUS | Status: DC
  Administered 2021-09-04: 6 via TOPICAL

## 2021-09-04 MED ORDER — BLOOD GLUCOSE MONITOR KIT: PACK | 0 refills | Status: DC

## 2021-09-04 MED ORDER — INSULIN DETEMIR 100 UNIT/ML ~~LOC~~ SOLN
22.0000 [IU] | Freq: Every day | SUBCUTANEOUS | Status: DC
  Administered 2021-09-04: 22 [IU] via SUBCUTANEOUS
  Filled 2021-09-04 (×2): qty 0.22

## 2021-09-04 NOTE — Progress Notes (Signed)
Patient fasting glucose results @ 0400 were 67, patient alert, responsive and able to swallow. Patient given juice and graham crackers to correct hypoglycemia, will recheck blood sugar in 30 minutes.

## 2021-09-04 NOTE — TOC Transition Note (Signed)
Transition of Care [facility]) - CM/SW Discharge Note   Patient Details  Name: [name] MRN: [id number] Date of Birth: 1935/02/28  [facility] CM/SW Contact:  Shade Flood, LCSW Phone Number: 09/04/2021, 12:50 PM   Clinical Narrative:     Pt stable for dc today per MD. Plan remains for dc home with continued HH from [place]. Explained importance of [facility] RN visits for further DM teaching and management and encouraged pt to allow the [facility] RN to make a couple visits. Pt then stated, "What do I need them for? I think that's stupid." Ultimately pt stated that he would let them make a couple visits. Also discussed with pt's daughter, Vaughan Basta, who asked for [facility] to contact her to arrange the [facility] visits and she would plan to be there to make sure pt would allow the care. Also gave Vaughan Basta the contact information for [place] at her request.  Scheduled pt a follow up appointment with Dr. Nevada Crane and added to AVS. Daughter Vaughan Basta updated and she stated she would go with pt to appointment.  There are no other TOC needs identified for dc.  Final next level of care: [place] Barriers to Discharge: Barriers Resolved   Patient Goals and CMS Choice Patient states their goals for this hospitalization and ongoing recovery are:: go home      Discharge Placement                       Discharge Plan and Services In-house Referral: Clinical Social Work                                   Social Determinants of Health (SDOH) Interventions     Readmission Risk Interventions Readmission Risk Prevention Plan 08/30/2021 06/12/2021 06/12/2021  Transportation Screening Complete Complete -  PCP or Specialist Appt within 3-5 Days Complete - Complete  HRI or Home Care Consult Complete - Complete  Social Work Consult for [place] Planning/Counseling Complete - Complete  Palliative Care Screening Not Applicable - Not Applicable  Medication Review Human resources officer) Complete - Complete  Some recent data might be hidden

## 2021-09-04 NOTE — Progress Notes (Addendum)
Inpatient Diabetes Program Recommendations  AACE/ADA: New Consensus Statement on Inpatient Glycemic Control   Target Ranges:  Prepandial:   less than 140 mg/dL      Peak postprandial:   less than 180 mg/dL (1-2 hours)      Critically ill patients:  140 - 180 mg/dL    Latest Reference Range & Units 09/04/21 04:16 09/04/21 04:57 09/04/21 07:07  Glucose-Capillary 70 - 99 mg/dL 63 (L) 113 (H) 132 (H)    Latest Reference Range & Units 09/03/21 06:34 09/03/21 07:49 09/03/21 11:12 09/03/21 16:00 09/03/21 21:09 09/03/21 23:53  Glucose-Capillary 70 - 99 mg/dL 262 (H) 209 (H) 207 (H) 201 (H) 176 (H) 111 (H)    Latest Reference Range & Units 09/03/21 03:42  Glucose 70 - 99 mg/dL 616 (HH)    Latest Reference Range & Units 05/06/21 06:05 06/04/21 12:39 08/28/21 04:30  Hemoglobin A1C 4.8 - 5.6 % 8.3 (H) 9.2 (H) 8.6 (H)   Review of Glycemic Control  Diabetes history: DM2 Outpatient Diabetes medications: Toujeo 25 units QAM (per home med list, pt is taking 80 units daily), Glipizide 10 mg BID, Humalog 1-10 units with big meals Current orders for Inpatient glycemic control: Levemir 30 units daily, Novolog 0-15 units TID with meals, Novolog 0-5 units QHS  Inpatient Diabetes Program Recommendations:    Insulin: Fasting glucose 63 mg/dl today. Please consider decreasing Levemir to 25 units daily.  NURSING: Please continue to work with patient's wife on glucose monitoring and insulin administration. Please allow patient's wife to administer insulin injections while she is at bedside.  Addendum 09/04/21@10 :53-Tried to call patient' daughter Storm Frisk) but no answer. Anticipate patient and wife will need family support to help patient manage DM.  Addendum 09/04/21@12 :25-Received return call from Storm Frisk (patient's daughter). Spoke with patient's daughter over the phone about diabetes. She lives 5 minutes from her father. Vaughan Basta just had knee surgery a couple of weeks ago and is not able to drive  yet. Patient reports being followed by Dr. Nevada Crane for diabetes management. She states that patient is not taking DM medications like he should be. She reports that she has tried to help with DM control but her father will not allow her to help as he is stubborn and will not listen to her. She states that her father is suppose to take Toujeo once a day but he will not take it consistently. Vaughan Basta reports that she has checked what medications he has at home and she notes that he has lots of medications and some of it is outdated and she has thrown it away but he got it out of the trash and put it back in the refrigerator.  She also notes that her step mom [name], patient's wife) has been taught by her several times and video recorded steps on how to do various things but [name] still has trouble with doing whatever she has been shown to do. Vaughan Basta states that patient will not listen to anyone but Dr. Nevada Crane and his urologist.  Vaughan Basta would like to have a telephone conversation with patient, Rona Ravens, and Dr. Nevada Crane so he will actually do what he is told to do with his medications.  Vaughan Basta states that she plans to talk to her father about giving her medical power of attorney so she can help him and get his medical information when needed. Vaughan Basta states that she will try to help her father as much as she can and that she would like to be contacted by  TOC about home health because she wants to be present when they come to see her father so she can be sure that her father does not turn them away again. Encouraged Vaughan Basta to talk with patient and to contact Dr. Juel Burrow office to see if they could do a telephone call to include the patient, his wife, Vaughan Basta, and Dr. Nevada Crane.   Vaughan Basta verbalized understanding of information discussed and reports no further questions at this time related to diabetes. Sent secure chat message at 13:04 today to Sherron Flemings, LCSW about contacting Vaughan Basta regarding home health.  Thanks, Barnie Alderman, RN, MSN,  CDE Diabetes Coordinator Inpatient Diabetes Program [phone] (Team Pager from 8am to 5pm)

## 2021-09-04 NOTE — Progress Notes (Signed)
Discharge instructions  given patient verbalized understanding. Discharged patient via wheelchair by private vehicle.

## 2021-09-04 NOTE — Discharge Instructions (Addendum)
Ask Dr. Nevada Crane about FreeStyle Libre2 CGM if interested in using the device.

## 2021-09-04 NOTE — Discharge Summary (Signed)
Physician Discharge Summary  [name] ZOX:[id number] DOB: 01/04/1935 DOA: 09/03/2021  PCP: Brandon Squibb, MD  Admit date: 09/03/2021 Discharge date: 09/04/2021  Time spent: 35 minutes  Recommendations for Outpatient Follow-up:  Repeat BMET to follow electrolytes and renal function  Reassess CBG log/fluctuation and further adjust hypoglycemic regimen as needed. Reassess BP and adjust antihypertensive regimen.   Discharge Diagnoses:  Principal Problem:   Acute metabolic encephalopathy Active Problems:   Acute lower UTI   Hyperglycemia   Altered mental status   Chronic renal failure, stage 3b ([place])   Discharge Condition: Stable and improved.  Discharged home with instruction to follow-up with PCP in 10 days.  CODE STATUS: Full code.  Diet recommendation: Heart healthy modified carbohydrate diet.  Filed Weights   09/03/21 0325 09/03/21 0754  Weight: 98.1 kg 99.4 kg    History of present illness:  [name] is a 86 y.o. male with medical history significant of B12 deficiency, hypertension, hyperlipidemia, chronic kidney disease stage IIIb, coronary tree disease, type 2 diabetes with nephropathy/neuropathy, history of colon cancer, history of prostate cancer, gastroesophageal reflux disease and obstructive sleep apnea not on BiPAP; who had been recently admitted to the hospital secondary to UTI and after appropriate treatment discharged home on oral antibiotics.  While at home patient experience an episode of confusion waking up early in the morning and being disoriented and opening the door of his house; alarm went off and wife wake up finding the patient confused.  Patient's wife thought that the patient had an episode of low blood sugar and gave in a jelly sandwich and orange juice; they could not check his blood sugar level with glucometer at home.  There has not been any fever, nausea, vomiting, chest pain, hematuria, melena, hematochezia or focal weakness.   Patient's symptoms started to improve son after intervention patient was brought to the hospital for further evaluation and management. Of note, patient wife reported that at times patient's woke up in the middle of the night confused and diaphoretic when his sugar is low.  There was no diaphoresis or sweating this time.   Patient is vaccinated and boosted against COVID; COVID PCR check in the ED within normal limits.   ED Course: CT head without acute intracranial abnormalities, patient was afebrile, urinalysis stable and not reporting signs of infection currently; normal CBC and a stable electrolytes and renal function.  Patient blood sugar was found to be in the 600 range after insulin provided down into the 400s.  As patient mentation was now back to baseline dry hospital was contacted to place in the hospital for observation and further evaluation.    Hospital Course:  1-acute metabolic encephalopathy -in the setting to hypoglycemia and hyperglycemic events. -normal B12, vit D and TSH level -altered mentation resolved and back to baseline at time of discharge. -Patient advised to take medications as prescribed, maintain adequate hydration and follow-up with PCP in 10 days.  2-recent E. coli UTI -Currently not complaining of dysuria -Urinalysis at time of admission without leukocytes or nitrites -Complete antibiotic therapy as previously prescribed; based on sensitivity antibiotics of choice was adequate. -Patient advised to maintain adequate hydration.  3-gastroesophageal reflux disease -Continue PPI.  4-hypertension -Patient advised to follow heart healthy diet -Continue the use of propanolol and Norvasc.  5-history of COPD/obstructive sleep apnea -No complaining of shortness of breath there was no wheezing on examination. -ABG demonstrated adequate O2 and CO2 levels -Resume home bronchodilator management.  6-chronic kidney disease  a stage IIIb/neuropathy -Renal function at  baseline -Continue home dose of Neurontin -Continue minimizing the use of nephrotoxic agents and maintain adequate hydration -Repeat basic metabolic panel at follow-up visit to follow electrolytes and renal function trend.  7-type 2 diabetes mellitus -Patient has been discharged on adjusted dose of insulin long-acting daily extended release metformin -Advised to follow modified carbohydrate diet and to not skip meals. -Outpatient follow-up with PCP to further adjust hypoglycemic regimen as required -Hemoglobin A1c recently done 8.6   Procedures: See below for x-ray reports.  Consultations: Diabetes coordinator  Discharge Exam: Vitals:   09/04/21 0501 09/04/21 0959  BP: 129/77 129/77  Pulse: 63   Resp: 18   Temp: 98 F (36.7 C)   SpO2: 96% 97%    General: Afebrile, no chest pain, no nausea, no vomiting.  Patient mentation back to baseline currently oriented x3.  In no acute distress.  Feeling ready to go home. Cardiovascular: S1 and S2, no rubs, no gallops, no JVD on exam. Respiratory: Good air movement bilaterally; no using accessory muscle.  Good saturation on room air. Abdomen: Soft, nontender, positive bowel sounds Extremities: No cyanosis or clubbing.  Discharge Instructions    Allergies as of 09/04/2021       Reactions   Bee Venom Anaphylaxis   Adhesive [tape] Hives   Ciprofloxacin    Unknown    Codeine    Unknown    Iodine    Unknown    Latex    Unknown    Povidone-iodine    Unknown    Simvastatin    Unknown         Medication List     STOP taking these medications    glipiZIDE 10 MG tablet Commonly known as: GLUCOTROL   insulin lispro 100 UNIT/ML cartridge Commonly known as: HUMALOG       TAKE these medications    acetaminophen 325 MG tablet Commonly known as: TYLENOL Take 650 mg by mouth every 4 (four) hours as needed for mild pain or moderate pain. Take 2 tablets 1 hour prior to Rituxan treatment.   albuterol 108 (90 Base)  MCG/ACT inhaler Commonly known as: ProAir HFA Inhale 2 puffs into the lungs at bedtime as needed for wheezing or shortness of breath.   amLODipine 5 MG tablet Commonly known as: NORVASC Take 1 tablet (5 mg total) by mouth daily.   blood glucose meter kit and supplies Kit Dispense based on patient and insurance preference. Use at least three times a day to check BS levels and fluctuation.   budesonide-formoterol 160-4.5 MCG/ACT inhaler Commonly known as: SYMBICORT Inhale 2 puffs into the lungs 2 (two) times daily.   cefadroxil 500 MG capsule Commonly known as: DURICEF Take 1 capsule (500 mg total) by mouth 2 (two) times daily for 2 days.   docusate sodium 100 MG capsule Commonly known as: COLACE Take 200 mg by mouth daily as needed for mild constipation.   fluticasone 50 MCG/ACT nasal spray Commonly known as: FLONASE Place 1 spray into both nostrils as needed for allergies or rhinitis.   gabapentin 300 MG capsule Commonly known as: NEURONTIN Take 1 cap in AM and 2 caps at night (300AM, 600PM)   hydrocortisone 2.5 % cream Apply 1 application topically as needed (itching).   ipratropium 0.06 % nasal spray Commonly known as: ATROVENT Place 2 sprays into both nostrils 2 (two) times daily.   meclizine 25 MG tablet Commonly known as: ANTIVERT Take 25 mg by mouth 2 (two)  times daily as needed for dizziness.   metFORMIN 750 MG 24 hr tablet Commonly known as: Glucophage XR Take 1 tablet (750 mg total) by mouth daily with breakfast.   pantoprazole 40 MG tablet Commonly known as: PROTONIX TAKE 1 TABLET BY MOUTH 2 TIMES DAILY BEFORE A MEAL. What changed: See the new instructions.   propranolol 80 MG 24 hr capsule Commonly known as: INNOPRAN XL Take 1 capsule (80 mg total) by mouth at bedtime.   temazepam 15 MG capsule Commonly known as: RESTORIL Take 15 mg by mouth at bedtime as needed for sleep.   testosterone cypionate 200 MG/ML injection Commonly known as:  DEPOTESTOSTERONE CYPIONATE Inject 200 mg into the muscle every 14 (fourteen) days.   Toujeo SoloStar 300 UNIT/ML Solostar Pen Generic drug: insulin glargine (1 Unit Dial) Inject 25 Units into the skin every morning.       Allergies  Allergen Reactions   Bee Venom Anaphylaxis   Adhesive [Tape] Hives   Ciprofloxacin     Unknown    Codeine     Unknown     Iodine     Unknown     Latex     Unknown     Povidone-Iodine     Unknown     Simvastatin     Unknown      Follow-up Information     Brandon Squibb, MD. Schedule an appointment as soon as possible for a visit in 10 day(s).   Specialty: Internal Medicine Contact information: [facility] [address] [phone]         Satira Sark, MD .   Specialty: Cardiology Contact information: [place] [address] [phone]                 The results of significant diagnostics from this hospitalization (including imaging, microbiology, ancillary and laboratory) are listed below for reference.    Significant Diagnostic Studies: DG Chest 2 View  Result Date: 09/03/2021 CLINICAL DATA:  86 year old male with altered mental status. History of colon and prostate cancer. EXAM: CHEST - 2 VIEW COMPARISON:  Chest CT 05/19/2021. Recent CT Abdomen and Pelvis 08/28/2021. Portable chest 08/27/2021 and earlier. FINDINGS: Upright AP and lateral views of the chest. Continued low lung volumes. Stable mild elevation of the right hemidiaphragm. Normal cardiac size and mediastinal contours. No pneumothorax, pulmonary edema or pleural effusion. Mild lung base atelectasis suspected. No consolidation. Cervical ACDF. No acute osseous abnormality identified. Negative visible bowel gas. IMPRESSION: Low lung volumes with mild atelectasis. Electronically Signed   By: Genevie Ann M.D.   On: 09/03/2021 04:15   CT Head Wo Contrast  Result Date: 09/03/2021 CLINICAL DATA:  86 year old male with altered  mental status, confused. Possible hypoglycemia. EXAM: CT HEAD WITHOUT CONTRAST TECHNIQUE: Contiguous axial images were obtained from the base of the skull through the vertex without intravenous contrast. COMPARISON:  Brain MRI 01/30/2016.  Head CT 06/04/2021. FINDINGS: Brain: Stable cerebral volume. No midline shift, ventriculomegaly, mass effect, evidence of mass lesion, intracranial hemorrhage or evidence of cortically based acute infarction. Mild for age patchy bilateral white matter hypodensity is stable with otherwise preserved gray-white matter differentiation. Vascular: Mild Calcified atherosclerosis at the skull base. No suspicious intracranial vascular hyperdensity. Skull: No acute osseous abnormality identified. Sinuses/Orbits: Mild bilateral paranasal sinus disease has not significantly changed. Other Visualized paranasal sinuses and mastoids are stable and well aerated. Other: No acute orbit or scalp soft tissue finding. IMPRESSION: 1. No acute  intracranial abnormality. Stable mild for age chronic white matter changes. 2. Mild maxillary sinus disease, stable since September. Electronically Signed   By: Genevie Ann M.D.   On: 09/03/2021 04:32   US RENAL  Result Date: 08/28/2021 CLINICAL DATA:  Acute kidney injury. EXAM: RENAL / URINARY TRACT ULTRASOUND COMPLETE COMPARISON:  CT chest, abdomen, and pelvis dated May 19, 2021. FINDINGS: Right Kidney: Renal measurements: 9.8 x 5.4 x 6.0 cm = volume: 167 mL. Increased echogenicity. No mass or hydronephrosis visualized. Left Kidney: Renal measurements: 12.3 x 5.7 x 6.7 cm = volume: 246 mL. Increased echogenicity. Mild to moderate hydronephrosis. Multiple simple cysts measuring up to 3.6 cm. Bladder: Appears normal for degree of bladder distention. Other: None. IMPRESSION: 1. Mild to moderate left hydronephrosis. 2. Increased renal echogenicity, consistent with medical renal disease. Electronically Signed   By: Titus Dubin M.D.   On: 08/28/2021 09:37    DG Chest Portable 1 View  Result Date: 08/27/2021 CLINICAL DATA:  Fever. EXAM: PORTABLE CHEST 1 VIEW COMPARISON:  Chest radiograph dated 06/04/2021. FINDINGS: Shallow inspiration. There is mild cardiomegaly with mild vascular congestion. No focal consolidation, pleural effusion, pneumothorax. Atherosclerotic calcification of the aorta. Degenerative changes of the spine. Lower cervical ACDF. No acute osseous pathology. IMPRESSION: Cardiomegaly with mild vascular congestion. No focal consolidation. Electronically Signed   By: Anner Crete M.D.   On: 08/27/2021 18:57   CT RENAL [place] STUDY  Result Date: 08/28/2021 CLINICAL DATA:  Hydronephrosis EXAM: CT ABDOMEN AND PELVIS WITHOUT CONTRAST TECHNIQUE: Multidetector CT imaging of the abdomen and pelvis was performed following the standard protocol without IV contrast. COMPARISON:  Same-day renal ultrasound, CT abdomen/pelvis 05/19/2021 FINDINGS: Lower chest: The lung bases are clear. The imaged heart is unremarkable. Hepatobiliary: The liver is unremarkable. Small calcified gallstones are seen in the gallbladder neck. There is no evidence of acute cholecystitis. There is no biliary ductal dilatation. Pancreas: The pancreas demonstrates fatty atrophy, particularly involving the distal body and tail. There are no focal lesions or contour abnormalities. There is no main pancreatic ductal dilatation or peripancreatic inflammatory change. Spleen: Unremarkable. Adrenals/Urinary Tract: The adrenals are unremarkable. Multiple hypodense lesions are seen in each kidney, some of which are exophytic, consistent with multiple cysts. There is mild-to-moderate left and mild right hydroureteronephrosis. No obstructing lesion or Plouff is seen. There is mild stranding around the left ureter. There is mild symmetric bilateral perinephric stranding, slightly increased since the prior study of 05/19/2021. The bladder is mildly distended. No stones or other lesions are seen  within the bladder, within the confines of noncontrast technique. Stomach/Bowel: The stomach is unremarkable. There is no evidence of bowel obstruction. There is no abnormal bowel wall thickening or inflammatory change. There are scattered colonic diverticuli without evidence of acute diverticulitis. Vascular/Lymphatic: There is extensive calcified atherosclerotic plaque throughout the nonaneurysmal abdominal aorta. There is no abdominal or pelvic lymphadenopathy. Reproductive: The prostate is not identified and presumed surgically absent. The previously seen penile prosthesis has been removed. Other: There is no ascites or free air. Musculoskeletal: There is no acute osseous abnormality or aggressive osseous lesion. A bone island is noted in the right iliac wing. There is multilevel degenerative change throughout the lumbar spine. IMPRESSION: 1. Mild-to-moderate left and mild right hydroureteronephrosis with bilateral perinephric stranding and mild left periureteral stranding. No obstructing lesion or [place] is seen. Findings may reflect sequela of recently passed stones, though this is felt unlikely as no stones were present on the study from 05/19/2021. Stricture or chronic outlet  obstruction are additional considerations. 2. Pyelonephritis can not be excluded on this noncontrast study. 3. Interval removal of the penile prosthesis since the study of 05/19/2021. 4. Diverticulosis without evidence of acute diverticulitis. 5. Cholelithiasis without evidence of acute cholecystitis. 6.  Aortic Atherosclerosis (ICD10-I70.0). Electronically Signed   By: Valetta Mole M.D.   On: 08/28/2021 16:04    Microbiology: Recent Results (from the past 240 hour(s))  Blood culture (routine x 2)     Status: None   Collection Time: 08/27/21  6:33 PM   Specimen: BLOOD LEFT FOREARM  Result Value Ref Range Status   Specimen Description BLOOD LEFT FOREARM  Final   Special Requests   Final    BOTTLES DRAWN AEROBIC AND ANAEROBIC  Blood Culture adequate volume   Culture   Final    NO GROWTH 5 DAYS Performed at [facility], [address], [place], [place] [address]    Report Status 09/01/2021 FINAL  Final  Blood culture (routine x 2)     Status: None   Collection Time: 08/27/21  6:33 PM   Specimen: Left Antecubital; Blood  Result Value Ref Range Status   Specimen Description LEFT ANTECUBITAL  Final   Special Requests   Final    BOTTLES DRAWN AEROBIC AND ANAEROBIC Blood Culture adequate volume   Culture   Final    NO GROWTH 5 DAYS Performed at [facility], [address], [place], [place] [address]    Report Status 09/01/2021 FINAL  Final  Resp Panel by RT-PCR (Flu A&B, Covid) Nasopharyngeal Swab     Status: None   Collection Time: 08/27/21  6:38 PM   Specimen: Nasopharyngeal Swab; Nasopharyngeal(NP) swabs in vial transport medium  Result Value Ref Range Status   SARS Coronavirus 2 by RT PCR NEGATIVE NEGATIVE Final    Comment: (NOTE) SARS-CoV-2 target nucleic acids are NOT DETECTED.  The SARS-CoV-2 RNA is generally detectable in upper respiratory specimens during the acute phase of infection. The lowest concentration of SARS-CoV-2 viral copies this assay can detect is 138 copies/mL. A negative result does not preclude SARS-Cov-2 infection and should not be used as the sole basis for treatment or other patient management decisions. A negative result may occur with  improper specimen collection/handling, submission of specimen other than nasopharyngeal swab, presence of viral mutation(s) within the areas targeted by this assay, and inadequate number of viral copies(<138 copies/mL). A negative result must be combined with clinical observations, patient history, and epidemiological information. The expected result is Negative.  Fact Sheet for Patients:  EntrepreneurPulse.com.au  Fact Sheet for Healthcare Providers:  IncredibleEmployment.be  This test is no t yet  approved or cleared by the [place] FDA and  has been authorized for detection and/or diagnosis of SARS-CoV-2 by FDA under an Emergency Use Authorization (EUA). This EUA will remain  in effect (meaning this test can be used) for the duration of the COVID-19 declaration under Section 564(b)(1) of the Act, 21 U.S.C.section 360bbb-3(b)(1), unless the authorization is terminated  or revoked sooner.       Influenza A by PCR NEGATIVE NEGATIVE Final   Influenza B by PCR NEGATIVE NEGATIVE Final    Comment: (NOTE) The Xpert Xpress SARS-CoV-2/FLU/RSV plus assay is intended as an aid in the diagnosis of influenza from Nasopharyngeal swab specimens and should not be used as a sole basis for treatment. Nasal washings and aspirates are unacceptable for Xpert Xpress SARS-CoV-2/FLU/RSV testing.  Fact Sheet for Patients: EntrepreneurPulse.com.au  Fact Sheet for Healthcare Providers: IncredibleEmployment.be  This test is not yet approved or cleared by the [place] and has been authorized for detection and/or diagnosis of SARS-CoV-2 by FDA under an Emergency Use Authorization (EUA). This EUA will remain in effect (meaning this test can be used) for the duration of the COVID-19 declaration under Section 564(b)(1) of the Act, 21 U.S.C. section 360bbb-3(b)(1), unless the authorization is terminated or revoked.  Performed at [facility], [address], [place], [place] [address]   Urine Culture     Status: Abnormal   Collection Time: 08/28/21  2:14 AM   Specimen: Urine, Clean Catch  Result Value Ref Range Status   Specimen Description   Final    URINE, CLEAN CATCH Performed at [facility], [address], [place], [place] [address]    Special Requests   Final    NONE Performed at [facility], [address] [place], [place] [address]    Culture >=100,000 COLONIES/mL ESCHERICHIA COLI (A)  Final   Report Status 08/30/2021 FINAL  Final    Organism ID, Bacteria ESCHERICHIA COLI (A)  Final      Susceptibility   Escherichia coli - MIC*    AMPICILLIN >=32 RESISTANT Resistant     CEFAZOLIN <=4 SENSITIVE Sensitive     CEFEPIME <=0.12 SENSITIVE Sensitive     CEFTRIAXONE <=0.25 SENSITIVE Sensitive     CIPROFLOXACIN <=0.25 SENSITIVE Sensitive     GENTAMICIN <=1 SENSITIVE Sensitive     IMIPENEM <=0.25 SENSITIVE Sensitive     NITROFURANTOIN <=16 SENSITIVE Sensitive     TRIMETH/SULFA <=20 SENSITIVE Sensitive     AMPICILLIN/SULBACTAM >=32 RESISTANT Resistant     PIP/TAZO <=4 SENSITIVE Sensitive     * >=100,000 COLONIES/mL ESCHERICHIA COLI  Resp Panel by RT-PCR (Flu A&B, Covid) Nasopharyngeal Swab     Status: None   Collection Time: 09/03/21  6:30 AM   Specimen: Nasopharyngeal Swab; Nasopharyngeal(NP) swabs in vial transport medium  Result Value Ref Range Status   SARS Coronavirus 2 by RT PCR NEGATIVE NEGATIVE Final    Comment: (NOTE) SARS-CoV-2 target nucleic acids are NOT DETECTED.  The SARS-CoV-2 RNA is generally detectable in upper respiratory specimens during the acute phase of infection. The lowest concentration of SARS-CoV-2 viral copies this assay can detect is 138 copies/mL. A negative result does not preclude SARS-Cov-2 infection and should not be used as the sole basis for treatment or other patient management decisions. A negative result may occur with  improper specimen collection/handling, submission of specimen other than nasopharyngeal swab, presence of viral mutation(s) within the areas targeted by this assay, and inadequate number of viral copies(<138 copies/mL). A negative result must be combined with clinical observations, patient history, and epidemiological information. The expected result is Negative.  Fact Sheet for Patients:  EntrepreneurPulse.com.au  Fact Sheet for Healthcare Providers:  IncredibleEmployment.be  This test is no t yet approved or cleared by the  [place] FDA and  has been authorized for detection and/or diagnosis of SARS-CoV-2 by FDA under an Emergency Use Authorization (EUA). This EUA will remain  in effect (meaning this test can be used) for the duration of the COVID-19 declaration under Section 564(b)(1) of the Act, 21 U.S.C.section 360bbb-3(b)(1), unless the authorization is terminated  or revoked sooner.       Influenza A by PCR NEGATIVE NEGATIVE Final   Influenza B by PCR NEGATIVE NEGATIVE Final    Comment: (NOTE) The Xpert Xpress SARS-CoV-2/FLU/RSV plus assay is intended as an aid in the diagnosis of influenza from Nasopharyngeal  swab specimens and should not be used as a sole basis for treatment. Nasal washings and aspirates are unacceptable for Xpert Xpress SARS-CoV-2/FLU/RSV testing.  Fact Sheet for Patients: EntrepreneurPulse.com.au  Fact Sheet for Healthcare Providers: IncredibleEmployment.be  This test is not yet approved or cleared by the [place] FDA and has been authorized for detection and/or diagnosis of SARS-CoV-2 by FDA under an Emergency Use Authorization (EUA). This EUA will remain in effect (meaning this test can be used) for the duration of the COVID-19 declaration under Section 564(b)(1) of the Act, 21 U.S.C. section 360bbb-3(b)(1), unless the authorization is terminated or revoked.  Performed at [facility], [address], [place], [place] [address]      Labs: Basic Metabolic Panel: Recent Labs  Lab 08/29/21 0517 08/30/21 0518 08/31/21 0540 09/03/21 0342 09/04/21 0509  NA 138 135 136 130* 138  K 4.0 4.1 4.2 4.7 4.1  CL 102 97* 100 96* 101  CO2 24 27 28 26 30   GLUCOSE 166* 245* 219* 616* 144*  BUN 35* 37* 30* 32* 25*  CREATININE 1.54* 1.68* 1.39* 1.59* 1.41*  CALCIUM 8.5* 8.5* 8.5* 8.5* 8.8*  MG 2.0 1.8 1.9  --   --    Liver Function Tests: Recent Labs  Lab 08/29/21 0517 08/30/21 0518 08/31/21 0540 09/03/21 0342  AST 16 17 17  17   ALT 23 22 21 20   ALKPHOS 58 61 60 69  BILITOT 1.3* 1.2 0.8 0.7  PROT 6.3* 6.6 6.2* 6.2*  ALBUMIN 3.4* 3.5 3.3* 3.5   CBC: Recent Labs  Lab 08/29/21 0517 08/30/21 0518 08/31/21 0540 09/03/21 0342 09/04/21 0509  WBC 5.1 6.7 4.9 4.0 6.5  NEUTROABS 3.8 5.2 3.4 2.6  --   HGB 13.8 13.8 13.5 12.7* 13.1  HCT 42.8 41.8 41.5 39.0 41.0  MCV 97.3 95.2 96.5 95.8 96.2  PLT 96* 119* 118* 130* 150   BNP (last 3 results) Recent Labs    06/04/21 2013 08/28/21 0430  BNP 250.0* 134.0*   CBG: Recent Labs  Lab 09/03/21 2353 09/04/21 0416 09/04/21 0457 09/04/21 0707 09/04/21 1117  GLUCAP 111* 63* 113* 132* 150*    Signed:  Barton Dubois MD.  [facility] 09/04/2021, 12:27 PM

## 2021-09-04 NOTE — Progress Notes (Signed)
Patient blood sugar retaken results 113, patient awake and alert no complaints of discomfort at this time.

## 2021-09-07 DIAGNOSIS — R0789 Other chest pain: Secondary | ICD-10-CM | POA: Diagnosis not present

## 2021-09-07 DIAGNOSIS — K219 Gastro-esophageal reflux disease without esophagitis: Secondary | ICD-10-CM | POA: Diagnosis not present

## 2021-09-07 DIAGNOSIS — D6949 Other primary thrombocytopenia: Secondary | ICD-10-CM | POA: Diagnosis not present

## 2021-09-07 DIAGNOSIS — L299 Pruritus, unspecified: Secondary | ICD-10-CM | POA: Diagnosis not present

## 2021-09-07 DIAGNOSIS — N1832 Chronic kidney disease, stage 3b: Secondary | ICD-10-CM | POA: Diagnosis not present

## 2021-09-07 DIAGNOSIS — G250 Essential tremor: Secondary | ICD-10-CM | POA: Diagnosis not present

## 2021-09-07 DIAGNOSIS — J45901 Unspecified asthma with (acute) exacerbation: Secondary | ICD-10-CM | POA: Diagnosis not present

## 2021-09-07 DIAGNOSIS — E1122 Type 2 diabetes mellitus with diabetic chronic kidney disease: Secondary | ICD-10-CM | POA: Diagnosis not present

## 2021-09-07 DIAGNOSIS — E782 Mixed hyperlipidemia: Secondary | ICD-10-CM | POA: Diagnosis not present

## 2021-09-07 DIAGNOSIS — J9601 Acute respiratory failure with hypoxia: Secondary | ICD-10-CM | POA: Diagnosis not present

## 2021-09-07 DIAGNOSIS — M6281 Muscle weakness (generalized): Secondary | ICD-10-CM | POA: Diagnosis not present

## 2021-09-07 DIAGNOSIS — E1140 Type 2 diabetes mellitus with diabetic neuropathy, unspecified: Secondary | ICD-10-CM | POA: Diagnosis not present

## 2021-09-10 DIAGNOSIS — Z466 Encounter for fitting and adjustment of urinary device: Secondary | ICD-10-CM | POA: Diagnosis not present

## 2021-09-10 DIAGNOSIS — N179 Acute kidney failure, unspecified: Secondary | ICD-10-CM | POA: Diagnosis not present

## 2021-09-10 DIAGNOSIS — C8310 Mantle cell lymphoma, unspecified site: Secondary | ICD-10-CM | POA: Diagnosis not present

## 2021-09-10 DIAGNOSIS — E1142 Type 2 diabetes mellitus with diabetic polyneuropathy: Secondary | ICD-10-CM | POA: Diagnosis not present

## 2021-09-10 DIAGNOSIS — E1122 Type 2 diabetes mellitus with diabetic chronic kidney disease: Secondary | ICD-10-CM | POA: Diagnosis not present

## 2021-09-10 DIAGNOSIS — G4733 Obstructive sleep apnea (adult) (pediatric): Secondary | ICD-10-CM | POA: Diagnosis not present

## 2021-09-10 DIAGNOSIS — E1151 Type 2 diabetes mellitus with diabetic peripheral angiopathy without gangrene: Secondary | ICD-10-CM | POA: Diagnosis not present

## 2021-09-10 DIAGNOSIS — N1832 Chronic kidney disease, stage 3b: Secondary | ICD-10-CM | POA: Diagnosis not present

## 2021-09-10 DIAGNOSIS — G250 Essential tremor: Secondary | ICD-10-CM | POA: Diagnosis not present

## 2021-09-10 DIAGNOSIS — D696 Thrombocytopenia, unspecified: Secondary | ICD-10-CM | POA: Diagnosis not present

## 2021-09-10 DIAGNOSIS — I129 Hypertensive chronic kidney disease with stage 1 through stage 4 chronic kidney disease, or unspecified chronic kidney disease: Secondary | ICD-10-CM | POA: Diagnosis not present

## 2021-09-10 DIAGNOSIS — N1330 Unspecified hydronephrosis: Secondary | ICD-10-CM | POA: Diagnosis not present

## 2021-09-10 DIAGNOSIS — K219 Gastro-esophageal reflux disease without esophagitis: Secondary | ICD-10-CM | POA: Diagnosis not present

## 2021-09-10 DIAGNOSIS — R339 Retention of urine, unspecified: Secondary | ICD-10-CM | POA: Diagnosis not present

## 2021-09-10 DIAGNOSIS — J45909 Unspecified asthma, uncomplicated: Secondary | ICD-10-CM | POA: Diagnosis not present

## 2021-09-10 DIAGNOSIS — G4700 Insomnia, unspecified: Secondary | ICD-10-CM | POA: Diagnosis not present

## 2021-09-10 DIAGNOSIS — N10 Acute tubulo-interstitial nephritis: Secondary | ICD-10-CM | POA: Diagnosis not present

## 2021-09-10 DIAGNOSIS — G9341 Metabolic encephalopathy: Secondary | ICD-10-CM | POA: Diagnosis not present

## 2021-09-10 DIAGNOSIS — E1165 Type 2 diabetes mellitus with hyperglycemia: Secondary | ICD-10-CM | POA: Diagnosis not present

## 2021-09-10 DIAGNOSIS — I2510 Atherosclerotic heart disease of native coronary artery without angina pectoris: Secondary | ICD-10-CM | POA: Diagnosis not present

## 2021-09-10 DIAGNOSIS — R918 Other nonspecific abnormal finding of lung field: Secondary | ICD-10-CM | POA: Diagnosis not present

## 2021-09-13 DIAGNOSIS — G4700 Insomnia, unspecified: Secondary | ICD-10-CM | POA: Diagnosis not present

## 2021-09-13 DIAGNOSIS — G9341 Metabolic encephalopathy: Secondary | ICD-10-CM | POA: Diagnosis not present

## 2021-09-13 DIAGNOSIS — G250 Essential tremor: Secondary | ICD-10-CM | POA: Diagnosis not present

## 2021-09-13 DIAGNOSIS — J45909 Unspecified asthma, uncomplicated: Secondary | ICD-10-CM | POA: Diagnosis not present

## 2021-09-13 DIAGNOSIS — N1832 Chronic kidney disease, stage 3b: Secondary | ICD-10-CM | POA: Diagnosis not present

## 2021-09-13 DIAGNOSIS — I129 Hypertensive chronic kidney disease with stage 1 through stage 4 chronic kidney disease, or unspecified chronic kidney disease: Secondary | ICD-10-CM | POA: Diagnosis not present

## 2021-09-13 DIAGNOSIS — R339 Retention of urine, unspecified: Secondary | ICD-10-CM | POA: Diagnosis not present

## 2021-09-13 DIAGNOSIS — I2510 Atherosclerotic heart disease of native coronary artery without angina pectoris: Secondary | ICD-10-CM | POA: Diagnosis not present

## 2021-09-13 DIAGNOSIS — C8310 Mantle cell lymphoma, unspecified site: Secondary | ICD-10-CM | POA: Diagnosis not present

## 2021-09-13 DIAGNOSIS — K219 Gastro-esophageal reflux disease without esophagitis: Secondary | ICD-10-CM | POA: Diagnosis not present

## 2021-09-13 DIAGNOSIS — R918 Other nonspecific abnormal finding of lung field: Secondary | ICD-10-CM | POA: Diagnosis not present

## 2021-09-13 DIAGNOSIS — N10 Acute tubulo-interstitial nephritis: Secondary | ICD-10-CM | POA: Diagnosis not present

## 2021-09-13 DIAGNOSIS — N1330 Unspecified hydronephrosis: Secondary | ICD-10-CM | POA: Diagnosis not present

## 2021-09-13 DIAGNOSIS — E1151 Type 2 diabetes mellitus with diabetic peripheral angiopathy without gangrene: Secondary | ICD-10-CM | POA: Diagnosis not present

## 2021-09-13 DIAGNOSIS — E1165 Type 2 diabetes mellitus with hyperglycemia: Secondary | ICD-10-CM | POA: Diagnosis not present

## 2021-09-13 DIAGNOSIS — D696 Thrombocytopenia, unspecified: Secondary | ICD-10-CM | POA: Diagnosis not present

## 2021-09-13 DIAGNOSIS — N179 Acute kidney failure, unspecified: Secondary | ICD-10-CM | POA: Diagnosis not present

## 2021-09-13 DIAGNOSIS — Z466 Encounter for fitting and adjustment of urinary device: Secondary | ICD-10-CM | POA: Diagnosis not present

## 2021-09-13 DIAGNOSIS — E1122 Type 2 diabetes mellitus with diabetic chronic kidney disease: Secondary | ICD-10-CM | POA: Diagnosis not present

## 2021-09-13 DIAGNOSIS — G4733 Obstructive sleep apnea (adult) (pediatric): Secondary | ICD-10-CM | POA: Diagnosis not present

## 2021-09-13 DIAGNOSIS — E1142 Type 2 diabetes mellitus with diabetic polyneuropathy: Secondary | ICD-10-CM | POA: Diagnosis not present

## 2021-09-19 ENCOUNTER — Ambulatory Visit (INDEPENDENT_AMBULATORY_CARE_PROVIDER_SITE_OTHER): Payer: Medicare Other | Admitting: Urology

## 2021-09-19 ENCOUNTER — Encounter: Payer: Self-pay | Admitting: Urology

## 2021-09-19 ENCOUNTER — Other Ambulatory Visit: Payer: Self-pay

## 2021-09-19 VITALS — BP 142/85 | HR 88

## 2021-09-19 DIAGNOSIS — G4733 Obstructive sleep apnea (adult) (pediatric): Secondary | ICD-10-CM | POA: Diagnosis not present

## 2021-09-19 DIAGNOSIS — E1142 Type 2 diabetes mellitus with diabetic polyneuropathy: Secondary | ICD-10-CM | POA: Diagnosis not present

## 2021-09-19 DIAGNOSIS — K219 Gastro-esophageal reflux disease without esophagitis: Secondary | ICD-10-CM | POA: Diagnosis not present

## 2021-09-19 DIAGNOSIS — G250 Essential tremor: Secondary | ICD-10-CM | POA: Diagnosis not present

## 2021-09-19 DIAGNOSIS — E1122 Type 2 diabetes mellitus with diabetic chronic kidney disease: Secondary | ICD-10-CM | POA: Diagnosis not present

## 2021-09-19 DIAGNOSIS — D696 Thrombocytopenia, unspecified: Secondary | ICD-10-CM | POA: Diagnosis not present

## 2021-09-19 DIAGNOSIS — N10 Acute tubulo-interstitial nephritis: Secondary | ICD-10-CM | POA: Diagnosis not present

## 2021-09-19 DIAGNOSIS — N1330 Unspecified hydronephrosis: Secondary | ICD-10-CM | POA: Diagnosis not present

## 2021-09-19 DIAGNOSIS — N1832 Chronic kidney disease, stage 3b: Secondary | ICD-10-CM | POA: Diagnosis not present

## 2021-09-19 DIAGNOSIS — G4700 Insomnia, unspecified: Secondary | ICD-10-CM | POA: Diagnosis not present

## 2021-09-19 DIAGNOSIS — Z466 Encounter for fitting and adjustment of urinary device: Secondary | ICD-10-CM | POA: Diagnosis not present

## 2021-09-19 DIAGNOSIS — E1151 Type 2 diabetes mellitus with diabetic peripheral angiopathy without gangrene: Secondary | ICD-10-CM | POA: Diagnosis not present

## 2021-09-19 DIAGNOSIS — I129 Hypertensive chronic kidney disease with stage 1 through stage 4 chronic kidney disease, or unspecified chronic kidney disease: Secondary | ICD-10-CM | POA: Diagnosis not present

## 2021-09-19 DIAGNOSIS — R339 Retention of urine, unspecified: Secondary | ICD-10-CM | POA: Diagnosis not present

## 2021-09-19 DIAGNOSIS — N179 Acute kidney failure, unspecified: Secondary | ICD-10-CM | POA: Diagnosis not present

## 2021-09-19 DIAGNOSIS — C8310 Mantle cell lymphoma, unspecified site: Secondary | ICD-10-CM | POA: Diagnosis not present

## 2021-09-19 DIAGNOSIS — J45909 Unspecified asthma, uncomplicated: Secondary | ICD-10-CM | POA: Diagnosis not present

## 2021-09-19 DIAGNOSIS — I2510 Atherosclerotic heart disease of native coronary artery without angina pectoris: Secondary | ICD-10-CM | POA: Diagnosis not present

## 2021-09-19 DIAGNOSIS — G9341 Metabolic encephalopathy: Secondary | ICD-10-CM | POA: Diagnosis not present

## 2021-09-19 DIAGNOSIS — R918 Other nonspecific abnormal finding of lung field: Secondary | ICD-10-CM | POA: Diagnosis not present

## 2021-09-19 DIAGNOSIS — E1165 Type 2 diabetes mellitus with hyperglycemia: Secondary | ICD-10-CM | POA: Diagnosis not present

## 2021-09-19 MED ORDER — MECLIZINE HCL 25 MG PO TABS: 25.0000 mg | ORAL_TABLET | Freq: Two times a day (BID) | ORAL | 0 refills | Status: DC | PRN

## 2021-09-19 NOTE — Progress Notes (Signed)
09/19/2021 9:31 AM   [name] 03-27-1935 [id number]  Referring provider: Celene Squibb, MD [address] [place],  [place] [address]  Followup urinary retention   HPI: [name] is a 86yo here for followup for urinary retention and urinary incontinence. Foley removed today and patient was able to urinate.  No dysuria or gross hematuria. 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, anomalous circumflex - no PCIs   Essential hypertension, benign    GERD (gastroesophageal reflux disease)    Mixed hyperlipidemia    Obstructive sleep apnea    does not use   Prostate cancer ([place])    PVD (peripheral vascular disease) ([place])    Type 2 diabetes mellitus ([place])     Surgical History: Past Surgical History:  Procedure Laterality Date   BACK SURGERY     BALLOON DILATION N/A 08/27/2013   Procedure: BALLOON DILATION;  Surgeon: Rogene Houston, MD;  [facility];  Service: Endoscopy;  Laterality: N/A;   COLON SURGERY     COLONOSCOPY N/A 12/17/2013   Procedure: COLONOSCOPY;  Surgeon: Rogene Houston, MD;  [facility];  Service: Endoscopy;  Laterality: N/A;  940   COLONOSCOPY N/A 02/28/2014   Procedure: COLONOSCOPY;  Surgeon: Rogene Houston, MD;  [facility];  Service: Endoscopy;  Laterality: N/A;  730   COLONOSCOPY N/A 08/04/2015   Procedure: COLONOSCOPY;  Surgeon: Rogene Houston, MD;  [facility];  Service: Endoscopy;  Laterality: N/A;  200 - moved to 11/11 @ 2:10 - Ann to notify pt   COLONOSCOPY WITH ESOPHAGOGASTRODUODENOSCOPY (EGD) N/A 08/27/2013   Procedure: COLONOSCOPY WITH ESOPHAGOGASTRODUODENOSCOPY (EGD);  Surgeon: Rogene Houston, MD;  [facility];  Service: Endoscopy;  Laterality: N/A;  730   CYSTOSCOPY N/A 06/09/2021   Procedure:  CYSTOSCOPY;  Surgeon: Lucas Mallow, MD;  Location: [facility];  Service: Urology;  Laterality: N/A;   IR REMOVAL TUN ACCESS W/ PORT W/O FL MOD SED  07/17/2018   KNEE ARTHROSCOPY Left    KNEE SURGERY Right    total knee   MALONEY DILATION N/A 08/27/2013   Procedure: MALONEY DILATION;  Surgeon: Rogene Houston, MD;  [facility];  Service: Endoscopy;  Laterality: N/A;   PORTACATH PLACEMENT Right 09/23/2012   PROSTATECTOMY     REMOVAL OF PENILE PROSTHESIS N/A 06/09/2021   Procedure: REMOVAL OF ARTIFICIAL URINARY SPHINCER; REMOVAL OF INFLATABLE PENILE PROSTHESIS;  Surgeon: Lucas Mallow, MD;  Location: [facility];  Service: Urology;  Laterality: N/A;   removal of port Right    SAVORY DILATION N/A 08/27/2013   Procedure: SAVORY DILATION;  Surgeon: Rogene Houston, MD;  [facility];  Service: Endoscopy;  Laterality: N/A;   SHOULDER SURGERY     TONSILLECTOMY     URINARY SPHINCTER IMPLANT N/A 01/23/2017   artificial urinary sphincter implant by Dr. Odis Luster   VASECTOMY      Home Medications:  Allergies as of 09/19/2021       Reactions   Bee Venom Anaphylaxis   Adhesive [tape] Hives   Ciprofloxacin    Unknown    Codeine    Unknown    Iodine    Unknown    Latex  Unknown   ° Povidone-iodine   ° Unknown   ° Simvastatin   ° Unknown   ° °  ° °  °Medication List  °  ° °  ° Accurate as of September 19, 2021  9:31 AM. If you have any questions, ask your nurse or doctor.  °  °  ° °  ° °acetaminophen 325 MG tablet °Commonly known as: TYLENOL °Take 650 mg by mouth every 4 (four) hours as needed for mild pain or moderate pain. Take 2 tablets 1 hour prior to Rituxan treatment. °  °albuterol 108 (90 Base) MCG/ACT inhaler °Commonly known as: ProAir HFA °Inhale 2 puffs into the lungs at bedtime as needed for wheezing or shortness of breath. °  °amLODipine 5 MG tablet °Commonly known as: NORVASC °Take 1 tablet (5 mg total) by mouth daily. °  °blood glucose meter kit and supplies  Kit °Dispense based on patient and insurance preference. Use at least three times a day to check BS levels and fluctuation. °  °budesonide-formoterol 160-4.5 MCG/ACT inhaler °Commonly known as: SYMBICORT °Inhale 2 puffs into the lungs 2 (two) times daily. °  °docusate sodium 100 MG capsule °Commonly known as: COLACE °Take 200 mg by mouth daily as needed for mild constipation. °  °fluticasone 50 MCG/ACT nasal spray °Commonly known as: FLONASE °Place 1 spray into both nostrils as needed for allergies or rhinitis. °  °gabapentin 300 MG capsule °Commonly known as: NEURONTIN °Take 1 cap in AM and 2 caps at night (300AM, 600PM) °  °hydrocortisone 2.5 % cream °Apply 1 application topically as needed (itching). °  °ipratropium 0.06 % nasal spray °Commonly known as: ATROVENT °Place 2 sprays into both nostrils 2 (two) times daily. °  °meclizine 25 MG tablet °Commonly known as: ANTIVERT °Take 25 mg by mouth 2 (two) times daily as needed for dizziness. °  °metFORMIN 750 MG 24 hr tablet °Commonly known as: Glucophage XR °Take 1 tablet (750 mg total) by mouth daily with breakfast. °  °pantoprazole 40 MG tablet °Commonly known as: PROTONIX °TAKE 1 TABLET BY MOUTH 2 TIMES DAILY BEFORE A MEAL. °What changed: See the new instructions. °  °propranolol 80 MG 24 hr capsule °Commonly known as: INNOPRAN XL °Take 1 capsule (80 mg total) by mouth at bedtime. °  °temazepam 15 MG capsule °Commonly known as: RESTORIL °Take 15 mg by mouth at bedtime as needed for sleep. °  °testosterone cypionate 200 MG/ML injection °Commonly known as: DEPOTESTOSTERONE CYPIONATE °Inject 200 mg into the muscle every 14 (fourteen) days. °  °Toujeo SoloStar 300 UNIT/ML Solostar Pen °Generic drug: insulin glargine (1 Unit Dial) °Inject 25 Units into the skin every morning. °  ° °  ° ° °Allergies:  °Allergies  °Allergen Reactions  ° Bee Venom Anaphylaxis  ° Adhesive [Tape] Hives  ° Ciprofloxacin   °  Unknown   ° Codeine   °  Unknown  °  ° Iodine   °  Unknown  °  °  Latex   °  Unknown  °  ° Povidone-Iodine   °  Unknown  °  ° Simvastatin   °  Unknown  °  ° ° °Family History: °Family History  °Problem Relation Age of Onset  ° Colon cancer Brother   ° Cancer Brother   ° Diabetes Father   ° Cancer Brother   ° ° °Social History:  reports that he has never smoked. He has never used smokeless tobacco. He reports that he does not drink   alcohol and does not use drugs.  ROS: All other review of systems were reviewed and are negative except what is noted above in HPI  Physical Exam: BP (!) 142/85    Pulse 88   Constitutional:  Alert and oriented, No acute distress. HEENT: [place] AT, moist mucus membranes.  Trachea midline, no masses. Cardiovascular: No clubbing, cyanosis, or edema. Respiratory: Normal respiratory effort, no increased work of breathing. GI: Abdomen is soft, nontender, nondistended, no abdominal masses GU: No CVA tenderness.  Lymph: No cervical or inguinal lymphadenopathy. Skin: No rashes, bruises or suspicious lesions. Neurologic: Grossly intact, no focal deficits, moving all 4 extremities. Psychiatric: Normal mood and affect.  Laboratory Data: Lab Results  Component Value Date   WBC 6.5 09/04/2021   HGB 13.1 09/04/2021   HCT 41.0 09/04/2021   MCV 96.2 09/04/2021   PLT 150 09/04/2021    Lab Results  Component Value Date   CREATININE 1.41 (H) 09/04/2021    Lab Results  Component Value Date   PSA <0.01 11/28/2016   PSA <0.01 06/30/2015   PSA <0.01 (L) 08/02/2014    No results found for: TESTOSTERONE  Lab Results  Component Value Date   HGBA1C 8.6 (H) 08/28/2021    Urinalysis    Component Value Date/Time   COLORURINE YELLOW 09/03/2021 0515   APPEARANCEUR CLEAR 09/03/2021 0515   LABSPEC 1.010 09/03/2021 0515   PHURINE 5.0 09/03/2021 0515   GLUCOSEU >=500 (A) 09/03/2021 0515   HGBUR TRACE (A) 09/03/2021 0515   BILIRUBINUR NEGATIVE 09/03/2021 0515   BILIRUBINUR negative 05/04/2021 1702   KETONESUR NEGATIVE 09/03/2021 0515    PROTEINUR NEGATIVE 09/03/2021 0515   UROBILINOGEN 1.0 05/04/2021 1702   UROBILINOGEN 0.2 06/30/2015 1552   NITRITE NEGATIVE 09/03/2021 0515   LEUKOCYTESUR NEGATIVE 09/03/2021 0515    Lab Results  Component Value Date   BACTERIA NONE SEEN 09/03/2021    Pertinent Imaging:  Results for orders placed during the hospital encounter of 06/27/07  DG Abd 1 View  Narrative Clinical data: Nephrolithiasis  Abdomen  One view:  Comparison CT 04/08/2006. No definite nephrolithiasis. Small calcifications in the left pelvis may represent the phleboliths evident on previous CT. Small bowel decompressed. Moderate fecal material throughout the colon. Spondylitic changes in the lumbar spine.  Impression: 1. No convincing nephrolithiasis or ureteral calculus. CT is more sensitive study for small stones. 2. Nonobstructed bowel gas pattern with moderate colonic fecal material  Provider: Cecile Hearing  No results found for this or any previous visit.  No results found for this or any previous visit.  No results found for this or any previous visit.  Results for orders placed during the hospital encounter of 08/27/21  US RENAL  Narrative CLINICAL DATA:  Acute kidney injury.  EXAM: RENAL / URINARY TRACT ULTRASOUND COMPLETE  COMPARISON:  CT chest, abdomen, and pelvis dated May 19, 2021.  FINDINGS: Right Kidney:  Renal measurements: 9.8 x 5.4 x 6.0 cm = volume: 167 mL. Increased echogenicity. No mass or hydronephrosis visualized.  Left Kidney:  Renal measurements: 12.3 x 5.7 x 6.7 cm = volume: 246 mL. Increased echogenicity. Mild to moderate hydronephrosis. Multiple simple cysts measuring up to 3.6 cm.  Bladder:  Appears normal for degree of bladder distention.  Other:  None.  IMPRESSION: 1. Mild to moderate left hydronephrosis. 2. Increased renal echogenicity, consistent with medical renal disease.   Electronically Signed By: Titus Dubin M.D. On: 08/28/2021  09:37  No results found for this or any previous visit.  No results found for this or any previous visit. ° °Results for orders placed during the hospital encounter of 08/27/21 ° °CT RENAL [place] STUDY ° °Narrative °CLINICAL DATA:  Hydronephrosis ° °EXAM: °CT ABDOMEN AND PELVIS WITHOUT CONTRAST ° °TECHNIQUE: °Multidetector CT imaging of the abdomen and pelvis was performed °following the standard protocol without IV contrast. ° °COMPARISON:  Same-day renal ultrasound, CT abdomen/pelvis 05/19/2021 ° °FINDINGS: °Lower chest: The lung bases are clear. The imaged heart is °unremarkable. ° °Hepatobiliary: The liver is unremarkable. Small calcified gallstones °are seen in the gallbladder neck. There is no evidence of acute °cholecystitis. There is no biliary ductal dilatation. ° °Pancreas: The pancreas demonstrates fatty atrophy, particularly °involving the distal body and tail. There are no focal lesions or °contour abnormalities. There is no main pancreatic ductal dilatation °or peripancreatic inflammatory change. ° °Spleen: Unremarkable. ° °Adrenals/Urinary Tract: The adrenals are unremarkable. ° °Multiple hypodense lesions are seen in each kidney, some of which °are exophytic, consistent with multiple cysts. There is °mild-to-moderate left and mild right hydroureteronephrosis. No °obstructing lesion or [place] is seen. There is mild stranding around °the left ureter. There is mild symmetric bilateral perinephric °stranding, slightly increased since the prior study of 05/19/2021. ° °The bladder is mildly distended. No stones or other lesions are seen °within the bladder, within the confines of noncontrast technique. ° °Stomach/Bowel: The stomach is unremarkable. There is no evidence of °bowel obstruction. There is no abnormal bowel wall thickening or °inflammatory change. There are scattered colonic diverticuli without °evidence of acute diverticulitis. ° °Vascular/Lymphatic: There is extensive calcified  atherosclerotic °plaque throughout the nonaneurysmal abdominal aorta. There is no °abdominal or pelvic lymphadenopathy. ° °Reproductive: The prostate is not identified and presumed surgically °absent. The previously seen penile prosthesis has been removed. ° °Other: There is no ascites or free air. ° °Musculoskeletal: There is no acute osseous abnormality or aggressive °osseous lesion. A bone island is noted in the right iliac wing. °There is multilevel degenerative change throughout the lumbar spine. ° °IMPRESSION: °1. Mild-to-moderate left and mild right hydroureteronephrosis with °bilateral perinephric stranding and mild left periureteral °stranding. No obstructing lesion or [place] is seen. Findings may °reflect sequela of recently passed stones, though this is felt °unlikely as no stones were present on the study from 05/19/2021. °Stricture or chronic outlet obstruction are additional °considerations. °2. Pyelonephritis can not be excluded on this noncontrast study. °3. Interval removal of the penile prosthesis since the study of °05/19/2021. °4. Diverticulosis without evidence of acute diverticulitis. °5. Cholelithiasis without evidence of acute cholecystitis. °6.  Aortic Atherosclerosis (ICD10-I70.0). ° ° °Electronically Signed °By: Peter  Noone M.D. °On: 08/28/2021 16:04 ° ° °Assessment & Plan:   ° °1. Urinary retention °-Voiding trial passed today. Patient to followup with Dr. Terlecki for management of his urinary incontinenence ° ° °No follow-ups on file.- ° °[facility] °  °

## 2021-09-19 NOTE — Progress Notes (Signed)
Catheter Removal  Patient is present today for a catheter removal.  50ml of water was drained from the balloon. A 14FR foley cath was removed from the bladder no complications were noted . Patient tolerated well.  Performed by: Estill Bamberg RN  Follow up/ Additional notes: patient to MD

## 2021-09-19 NOTE — Patient Instructions (Signed)
Urinary Incontinence Urinary incontinence refers to a condition in which a person is unable to control where and when to pass urine. A person with this condition will urinate involuntarily. This means that the person urinates when he or she does not mean to. What are the causes? This condition may be caused by: Medicines. Infections. Constipation. Overactive bladder muscles. Weak bladder muscles. Weak pelvic floor muscles. These muscles provide support for the bladder, intestine, and, in women, the uterus. Enlarged prostate in men. The prostate is a gland near the bladder. When it gets too big, it can pinch the urethra. With the urethra blocked, the bladder can weaken and lose the ability to empty properly. Surgery. Emotional factors, such as anxiety, stress, or post-traumatic stress disorder (PTSD). Spinal cord injury, nerve injury, or other neurological conditions. Pelvic organ prolapse. This happens in women when organs move out of place and into the vagina. This movement can prevent the bladder and urethra from working properly. What increases the risk? The following factors may make you more likely to develop this condition: Age. The older you are, the higher the risk. Obesity. Being physically inactive. Pregnancy and childbirth. Menopause. Diseases that affect the nerves or spinal cord. Long-term, or chronic, coughing. This can increase pressure on the bladder and pelvic floor muscles. What are the signs or symptoms? Symptoms may vary depending on the type of urinary incontinence you have. They include: A sudden urge to urinate, and passing urine involuntarily before you can get to a bathroom (urge incontinence). Suddenly passing urine when doing activities that force urine to pass, such as coughing, laughing, exercising, or sneezing (stress incontinence). Needing to urinate often but urinating only a small amount, or constantly dribbling urine (overflow incontinence). Urinating  because you cannot get to the bathroom in time due to a physical disability, such as arthritis or injury, or due to a communication or thinking problem, such as Alzheimer's disease (functional incontinence). How is this diagnosed? This condition may be diagnosed based on: Your medical history. A physical exam. Tests, such as: Urine tests. X-rays of your kidney and bladder. Ultrasound. CT scan. Cystoscopy. In this procedure, a health care provider inserts a tube with a light and camera (cystoscope) through the urethra and into the bladder to check for problems. Urodynamic testing. These tests assess how well the bladder, urethra, and sphincter can store and release urine. There are different types of urodynamic tests, and they vary depending on what the test is measuring. To help diagnose your condition, your health care provider may recommend that you keep a log of when you urinate and how much you urinate. How is this treated? Treatment for this condition depends on the type of incontinence that you have and its cause. Treatment may include: Lifestyle changes, such as: Quitting smoking. Maintaining a healthy weight. Staying active. Try to get 150 minutes of moderate-intensity exercise every week. Ask your health care provider which activities are safe for you. Eating a healthy diet. Avoid high-fat foods, like fried foods. Avoid refined carbohydrates like white bread and white rice. Limit how much alcohol and caffeine you drink. Increase your fiber intake. Healthy sources of fiber include beans, whole grains, and fresh fruits and vegetables. Behavioral changes, such as: Pelvic floor muscle exercises. Bladder training, such as lengthening the amount of time between bathroom breaks, or using the bathroom at regular intervals. Using techniques to suppress bladder urges. This can include distraction techniques or controlled breathing exercises. Medicines, such as: Medicines to relax the  bladder  muscles and prevent bladder spasms. Medicines to help slow or prevent the growth of a man's prostate. Botox injections. These can help relax the bladder muscles. Treatments, such as: Using pulses of electricity to help change bladder reflexes (electrical nerve stimulation). For women, using a medical device to prevent urine leaks. This is a small, tampon-like, disposable device that is inserted into the urethra. Injecting collagen or carbon beads (bulking agents) into the urinary sphincter. These can help thicken tissue and close the bladder opening. Surgery. Follow these instructions at home: Lifestyle Limit alcohol and caffeine. These can fill your bladder quickly and irritate it. Keep yourself clean to help prevent odors and skin damage. Ask your health care provider about special skin creams and cleansers that can protect the skin from urine. Consider wearing pads or adult diapers. Make sure to change them regularly, and always change them right after experiencing incontinence. General instructions Take over-the-counter and prescription medicines only as told by your health care provider. Use the bathroom about every 3-4 hours, even if you do not feel the need to urinate. Try to empty your bladder completely every time. After urinating, wait a minute. Then try to urinate again. Make sure you are in a relaxed position while urinating. If your incontinence is caused by nerve problems, keep a log of the medicines you take and the times you go to the bathroom. Keep all follow-up visits. This is important. Where to find more information [employer] of Diabetes and Digestive and Kidney Diseases: [employer] American U[employer] Association: www.urologyhealth.org Contact a health care provider if: You have pain that gets worse. Your incontinence gets worse. Get help right away if: You have a fever or chills. You are unable to urinate. You have redness in your groin area or  down your legs. Summary Urinary incontinence refers to a condition in which a person is unable to control where and when to pass urine. This condition may be caused by medicines, infection, weak bladder muscles, weak pelvic floor muscles, enlargement of the prostate (in men), or surgery. Factors such as older age, obesity, pregnancy and childbirth, menopause, neurological diseases, and chronic coughing may increase your risk for developing this condition. Types of urinary incontinence include urge incontinence, stress incontinence, overflow incontinence, and functional incontinence. This condition is usually treated first with lifestyle and behavioral changes, such as quitting smoking, eating a healthier diet, and doing regular pelvic floor exercises. Other treatment options include medicines, bulking agents, medical devices, electrical nerve stimulation, or surgery. This information is not intended to replace advice given to you by your health care provider. Make sure you discuss any questions you have with your health care provider. Document Revised: 04/14/2020 Document Reviewed: 04/14/2020 Elsevier Patient Education  [employer].

## 2021-09-19 NOTE — Progress Notes (Signed)

## 2021-10-05 DIAGNOSIS — E782 Mixed hyperlipidemia: Secondary | ICD-10-CM | POA: Diagnosis not present

## 2021-10-05 DIAGNOSIS — N1832 Chronic kidney disease, stage 3b: Secondary | ICD-10-CM | POA: Diagnosis not present

## 2021-10-05 DIAGNOSIS — D6949 Other primary thrombocytopenia: Secondary | ICD-10-CM | POA: Diagnosis not present

## 2021-10-05 DIAGNOSIS — E1122 Type 2 diabetes mellitus with diabetic chronic kidney disease: Secondary | ICD-10-CM | POA: Diagnosis not present

## 2021-10-09 ENCOUNTER — Emergency Department (HOSPITAL_COMMUNITY): Payer: Medicare Other

## 2021-10-09 ENCOUNTER — Encounter (HOSPITAL_COMMUNITY): Payer: Self-pay | Admitting: Emergency Medicine

## 2021-10-09 ENCOUNTER — Other Ambulatory Visit: Payer: Self-pay

## 2021-10-09 ENCOUNTER — Emergency Department (HOSPITAL_COMMUNITY)
Admission: EM | Admit: 2021-10-09 | Discharge: 2021-10-09 | Disposition: A | Discharge status: Home / Self Care | Payer: Medicare Other | Attending: Emergency Medicine | Admitting: Emergency Medicine

## 2021-10-09 DIAGNOSIS — Z981 Arthrodesis status: Secondary | ICD-10-CM | POA: Diagnosis not present

## 2021-10-09 DIAGNOSIS — J9811 Atelectasis: Secondary | ICD-10-CM | POA: Diagnosis not present

## 2021-10-09 DIAGNOSIS — S0990XA Unspecified injury of head, initial encounter: Secondary | ICD-10-CM | POA: Diagnosis not present

## 2021-10-09 DIAGNOSIS — R0789 Other chest pain: Secondary | ICD-10-CM | POA: Insufficient documentation

## 2021-10-09 DIAGNOSIS — W19XXXA Unspecified fall, initial encounter: Secondary | ICD-10-CM

## 2021-10-09 DIAGNOSIS — R8290 Unspecified abnormal findings in urine: Secondary | ICD-10-CM | POA: Diagnosis not present

## 2021-10-09 DIAGNOSIS — R82998 Other abnormal findings in urine: Secondary | ICD-10-CM | POA: Diagnosis not present

## 2021-10-09 DIAGNOSIS — D72829 Elevated white blood cell count, unspecified: Secondary | ICD-10-CM | POA: Diagnosis not present

## 2021-10-09 DIAGNOSIS — S51812A Laceration without foreign body of left forearm, initial encounter: Secondary | ICD-10-CM | POA: Insufficient documentation

## 2021-10-09 DIAGNOSIS — Z91040 Latex allergy status: Secondary | ICD-10-CM | POA: Diagnosis not present

## 2021-10-09 DIAGNOSIS — R443 Hallucinations, unspecified: Secondary | ICD-10-CM | POA: Diagnosis not present

## 2021-10-09 DIAGNOSIS — S0003XA Contusion of scalp, initial encounter: Secondary | ICD-10-CM | POA: Insufficient documentation

## 2021-10-09 DIAGNOSIS — R0689 Other abnormalities of breathing: Secondary | ICD-10-CM | POA: Diagnosis not present

## 2021-10-09 DIAGNOSIS — R6889 Other general symptoms and signs: Secondary | ICD-10-CM | POA: Diagnosis not present

## 2021-10-09 DIAGNOSIS — R519 Headache, unspecified: Secondary | ICD-10-CM | POA: Diagnosis not present

## 2021-10-09 DIAGNOSIS — R739 Hyperglycemia, unspecified: Secondary | ICD-10-CM | POA: Diagnosis not present

## 2021-10-09 DIAGNOSIS — Z743 Need for continuous supervision: Secondary | ICD-10-CM | POA: Diagnosis not present

## 2021-10-09 DIAGNOSIS — W01198A Fall on same level from slipping, tripping and stumbling with subsequent striking against other object, initial encounter: Secondary | ICD-10-CM | POA: Insufficient documentation

## 2021-10-09 DIAGNOSIS — Z043 Encounter for examination and observation following other accident: Secondary | ICD-10-CM | POA: Diagnosis not present

## 2021-10-09 DIAGNOSIS — S51811A Laceration without foreign body of right forearm, initial encounter: Secondary | ICD-10-CM | POA: Insufficient documentation

## 2021-10-09 LAB — COMPREHENSIVE METABOLIC PANEL
ALT: 15 U/L (ref 0–44)
AST: 20 U/L (ref 15–41)
Albumin: 4.1 g/dL (ref 3.5–5.0)
Alkaline Phosphatase: 68 U/L (ref 38–126)
Anion gap: 9 (ref 5–15)
BUN: 31 mg/dL — ABNORMAL HIGH (ref 8–23)
CO2: 22 mmol/L (ref 22–32)
Calcium: 8.5 mg/dL — ABNORMAL LOW (ref 8.9–10.3)
Chloride: 104 mmol/L (ref 98–111)
Creatinine, Ser: 1.79 mg/dL — ABNORMAL HIGH (ref 0.61–1.24)
GFR, Estimated: 37 mL/min — ABNORMAL LOW (ref >60)
Glucose, Bld: 341 mg/dL — ABNORMAL HIGH (ref 70–99)
Potassium: 4 mmol/L (ref 3.5–5.1)
Sodium: 135 mmol/L (ref 135–145)
Total Bilirubin: 0.7 mg/dL (ref 0.3–1.2)
Total Protein: 6.5 g/dL (ref 6.5–8.1)

## 2021-10-09 LAB — URINALYSIS, ROUTINE W REFLEX MICROSCOPIC
Bilirubin Urine: NEGATIVE
Glucose, UA: >=500 mg/dL — AB
Ketones, ur: NEGATIVE mg/dL
Nitrite: NEGATIVE
Specific Gravity, Urine: 1.015 (ref 1.005–1.030)
pH: 6 (ref 5.0–8.0)

## 2021-10-09 LAB — URINALYSIS, MICROSCOPIC (REFLEX)
Squamous Epithelial / HPF: NONE SEEN (ref 0–5)
WBC, UA: >50 WBC/hpf (ref 0–5)

## 2021-10-09 LAB — CBC WITH DIFFERENTIAL/PLATELET
Abs Immature Granulocytes: 0.06 10*3/uL (ref 0.00–0.07)
Basophils Absolute: 0.1 10*3/uL (ref 0.0–0.1)
Basophils Relative: 1 %
Eosinophils Absolute: 0.1 10*3/uL (ref 0.0–0.5)
Eosinophils Relative: 2 %
HCT: 40.2 % (ref 39.0–52.0)
Hemoglobin: 13.4 g/dL (ref 13.0–17.0)
Immature Granulocytes: 1 %
Lymphocytes Relative: 13 %
Lymphs Abs: 1 10*3/uL (ref 0.7–4.0)
MCH: 31.4 pg (ref 26.0–34.0)
MCHC: 33.3 g/dL (ref 30.0–36.0)
MCV: 94.1 fL (ref 80.0–100.0)
Monocytes Absolute: 0.4 10*3/uL (ref 0.1–1.0)
Monocytes Relative: 5 %
Neutro Abs: 6 10*3/uL (ref 1.7–7.7)
Neutrophils Relative %: 78 %
Platelets: 113 10*3/uL — ABNORMAL LOW (ref 150–400)
RBC: 4.27 MIL/uL (ref 4.22–5.81)
RDW: 15.2 % (ref 11.5–15.5)
WBC: 7.6 10*3/uL (ref 4.0–10.5)
nRBC: 0 % (ref 0.0–0.2)

## 2021-10-09 MED ORDER — OXYCODONE-ACETAMINOPHEN 5-325 MG PO TABS
1.0000 | ORAL_TABLET | Freq: Once | ORAL | Status: AC
Start: 2021-10-09 — End: 2021-10-09
  Administered 2021-10-09: 1 via ORAL
  Filled 2021-10-09: qty 1

## 2021-10-09 MED ORDER — SODIUM CHLORIDE 0.9 % IV SOLN
2.0000 g | Freq: Once | INTRAVENOUS | Status: AC
  Administered 2021-10-09: 2 g via INTRAVENOUS
  Filled 2021-10-09: qty 20

## 2021-10-09 MED ORDER — LACTATED RINGERS IV BOLUS
1000.0000 mL | Freq: Once | INTRAVENOUS | Status: AC
  Administered 2021-10-09: 1000 mL via INTRAVENOUS

## 2021-10-09 MED ORDER — CEPHALEXIN 500 MG PO CAPS: 500.0000 mg | ORAL_CAPSULE | Freq: Four times a day (QID) | ORAL | 0 refills | Status: DC

## 2021-10-09 NOTE — ED Notes (Signed)
Patient complaining of right rib pain, MD made aware.

## 2021-10-09 NOTE — ED Provider Notes (Signed)
[facility] EMERGENCY DEPARTMENT Provider Note   CSN: [id number] Arrival date & time: 10/09/21  9485     History  Chief Complaint  Patient presents with   Lytle Michaels    [name] is a 87 y.o. male.  Patient slipped on a step. Fell down and hit back of head and right side of ribs. Recently treated for UTI. Has a skin tear on each of his arms but no severe pain in those areas. EMS states hallucinations but patient alert, oriented to person, place, time and situation at this time and does not appear to be hallucinating.    Fall   Home Medications Prior to Admission medications   Medication Sig Start Date End Date Taking? Authorizing Provider  cephALEXin (KEFLEX) 500 MG capsule Take 1 capsule (500 mg total) by mouth 4 (four) times daily. 10/09/21  Yes Vishwa Dais, Corene Cornea, MD  acetaminophen (TYLENOL) 325 MG tablet Take 650 mg by mouth every 4 (four) hours as needed for mild pain or moderate pain. Take 2 tablets 1 hour prior to Rituxan treatment.    [provider]  albuterol (PROAIR HFA) 108 (90 Base) MCG/ACT inhaler Inhale 2 puffs into the lungs at bedtime as needed for wheezing or shortness of breath. 05/12/20   Tanda Rockers, MD  amLODipine (NORVASC) 5 MG tablet Take 1 tablet (5 mg total) by mouth daily. 09/12/20   Burtis Junes, NP  blood glucose meter kit and supplies KIT Dispense based on patient and insurance preference. Use at least three times a day to check BS levels and fluctuation. 09/04/21   Barton Dubois, MD  budesonide-formoterol [facility]) 160-4.5 MCG/ACT inhaler Inhale 2 puffs into the lungs 2 (two) times daily. 05/12/20   Tanda Rockers, MD  docusate sodium (COLACE) 100 MG capsule Take 200 mg by mouth daily as needed for mild constipation.    [provider]  fluticasone (FLONASE) 50 MCG/ACT nasal spray Place 1 spray into both nostrils as needed for allergies or rhinitis.    [provider]  gabapentin (NEURONTIN) 300 MG capsule Take 1 cap in  AM and 2 caps at night (300AM, 600PM) 02/15/21   Frann Rider, NP  hydrocortisone 2.5 % cream Apply 1 application topically as needed (itching).    [provider]  ipratropium (ATROVENT) 0.06 % nasal spray Place 2 sprays into both nostrils 2 (two) times daily.  04/15/17   [provider]  meclizine (ANTIVERT) 25 MG tablet Take 1 tablet (25 mg total) by mouth 2 (two) times daily as needed for dizziness. 09/19/21   McKenzie, Candee Furbish, MD  metFORMIN (GLUCOPHAGE XR) 750 MG 24 hr tablet Take 1 tablet (750 mg total) by mouth daily with breakfast. 09/04/21 09/04/22  Barton Dubois, MD  pantoprazole (PROTONIX) 40 MG tablet TAKE 1 TABLET BY MOUTH 2 TIMES DAILY BEFORE A MEAL. Patient taking differently: Take 40 mg by mouth 2 (two) times daily. 05/08/20 09/04/21  Harvel Quale, MD  propranolol (INNOPRAN XL) 80 MG 24 hr capsule Take 1 capsule (80 mg total) by mouth at bedtime. 08/20/21   Frann Rider, NP  temazepam (RESTORIL) 15 MG capsule Take 15 mg by mouth at bedtime as needed for sleep. 06/26/20   [provider]  testosterone cypionate (DEPOTESTOSTERONE CYPIONATE) 200 MG/ML injection Inject 200 mg into the muscle every 14 (fourteen) days.  04/29/16   [provider]  TOUJEO SOLOSTAR 300 UNIT/ML Solostar Pen Inject 25 Units into the skin every morning. 06/08/21   Wynetta Emery, Clanford  L, MD  insulin aspart (NOVOLOG) 100 UNIT/ML injection Inject 20 Units into the skin 3 (three) times daily before meals.    12/13/11  [provider]  rosuvastatin (CRESTOR) 40 MG tablet Take 40 mg by mouth daily.    01/13/19  [provider]      Allergies    Bee venom, Adhesive [tape], Ciprofloxacin, Codeine, Iodine, Latex, Povidone-iodine, and Simvastatin    Review of Systems   Review of Systems  Physical Exam Updated Vital Signs BP (!) 162/116    Pulse 93    Temp 98.5 F (36.9 C) (Oral)    Resp (!) 27    Ht 5' 9"  (1.753 m)    Wt 99.4 kg    SpO2 92%    BMI  32.36 kg/m  Physical Exam Vitals and nursing note reviewed.  Constitutional:      Appearance: He is well-developed.  HENT:     Head: Normocephalic.     Comments: Hematoma and abrasion to posterior scalp without active bleedign or laceration    Mouth/Throat:     Mouth: Mucous membranes are moist.     Pharynx: Oropharynx is clear.  Eyes:     Pupils: Pupils are equal, round, and reactive to light.  Cardiovascular:     Rate and Rhythm: Normal rate.  Pulmonary:     Effort: Pulmonary effort is normal. No respiratory distress.  Abdominal:     General: Abdomen is flat. There is no distension.  Musculoskeletal:        General: Normal range of motion.     Cervical back: Normal range of motion.  Skin:    General: Skin is warm and dry.     Comments: Skin tears to bliateral forearms without underlying bony ttp or deformity  Neurological:     General: No focal deficit present.     Mental Status: He is alert.  Psychiatric:        Mood and Affect: Mood normal.    ED Results / Procedures / Treatments   Labs (all labs ordered are listed, but only abnormal results are displayed) Labs Reviewed  CBC WITH DIFFERENTIAL/PLATELET - Abnormal; Notable for the following components:      Result Value   Platelets 113 (*)    All other components within normal limits  COMPREHENSIVE METABOLIC PANEL - Abnormal; Notable for the following components:   Glucose, Bld 341 (*)    BUN 31 (*)    Creatinine, Ser 1.79 (*)    Calcium 8.5 (*)    GFR, Estimated 37 (*)    All other components within normal limits  URINALYSIS, ROUTINE W REFLEX MICROSCOPIC - Abnormal; Notable for the following components:   Glucose, UA >=500 (*)    Hgb urine dipstick SMALL (*)    Protein, ur TRACE (*)    Leukocytes,Ua LARGE (*)    All other components within normal limits  URINALYSIS, MICROSCOPIC (REFLEX) - Abnormal; Notable for the following components:   Bacteria, UA RARE (*)    All other components within normal limits   URINE CULTURE    EKG None  Radiology CT Head Wo Contrast  Result Date: 10/09/2021 CLINICAL DATA:  Recent fall with headaches, initial encounter EXAM: CT HEAD WITHOUT CONTRAST TECHNIQUE: Contiguous axial images were obtained from the base of the skull through the vertex without intravenous contrast. RADIATION DOSE REDUCTION: This exam was performed according to the departmental dose-optimization program which includes automated exposure control, adjustment of the mA and/or kV according to patient  size and/or use of iterative reconstruction technique. COMPARISON:  09/03/2021 FINDINGS: Brain: No evidence of acute infarction, hemorrhage, hydrocephalus, extra-axial collection or mass lesion/mass effect. Mild atrophic and ischemic changes are noted stable from the prior exam. Vascular: No hyperdense vessel or unexpected calcification. Skull: Normal. Negative for fracture or focal lesion. Sinuses/Orbits: Mild mucosal changes are noted within the left maxillary antrum stable in appearance from the prior exam. Minimal fluid is noted within the right mastoid air cells. Other: None IMPRESSION: Chronic atrophic and ischemic changes. Chronic sinus changes. Electronically Signed   By: Inez Catalina M.D.   On: 10/09/2021 03:34   DG Chest Portable 1 View  Result Date: 10/09/2021 CLINICAL DATA:  Status post fall with recently diagnosed UTI. EXAM: PORTABLE CHEST 1 VIEW COMPARISON:  September 03, 2021 FINDINGS: Low lung volumes are seen with mild, stable bibasilar atelectatic changes. There is no evidence of a pleural effusion or pneumothorax. The heart size and mediastinal contours are within normal limits. A radiopaque fusion plate and screws are seen overlying the cervical spine. The visualized skeletal structures are unremarkable. IMPRESSION: Low lung volumes with mild, stable bibasilar atelectatic changes. Electronically Signed   By: Virgina Norfolk M.D.   On: 10/09/2021 03:55    Procedures Procedures     Medications Ordered in ED Medications  lactated ringers bolus 1,000 mL (0 mLs Intravenous Stopped 10/09/21 0638)  cefTRIAXone (ROCEPHIN) 2 g in sodium chloride 0.9 % 100 mL IVPB (0 g Intravenous Stopped 10/09/21 0806)  oxyCODONE-acetaminophen (PERCOCET/ROXICET) 5-325 MG per tablet 1 tablet (1 tablet Oral Given 10/09/21 9480)    ED Course/ Medical Decision Making/ A&P                           Medical Decision Making Amount and/or Complexity of Data Reviewed External Data Reviewed: labs. Labs: ordered. Decision-making details documented in ED Course. Radiology: ordered and independent interpretation performed. Decision-making details documented in ED Course. ECG/medicine tests: ordered and independent interpretation performed. Decision-making details documented in ED Course.  Risk Prescription drug management. Decision regarding hospitalization.   Will eval for traumatic injury and check urine to ensure no infection.   Workup ok. Wound care performed by nursing. Pending urine.   Urine possibly infected, antibiotics tolerated culture sent.  Patient started having some right-sided rib pain no obvious fractures on x-ray however her could be an occult fracture.  I discussed short prescription for narcotics however he did not want to since he was already prone to following and will does utilize over-the-counter medications.  We will follow-up with PCP for further evaluation of the same.    Final Clinical Impression(s) / ED Diagnoses Final diagnoses:  Fall, initial encounter  Leukocytes in urine  Skin tear of forearm without complication, left, initial encounter    Rx / DC Orders ED Discharge Orders          Ordered    cephALEXin (KEFLEX) 500 MG capsule  4 times daily        10/09/21 0717              Iyania Denne, Corene Cornea, MD 10/10/21 [phone]

## 2021-10-09 NOTE — ED Triage Notes (Signed)
Pt had a fall at home and per ems pt is hallucinating-he was recently diagnosed with UTI.

## 2021-10-09 NOTE — Progress Notes (Signed)
Inpatient Diabetes Program Recommendations  AACE/ADA: New Consensus Statement on Inpatient Glycemic Control   Target Ranges:  Prepandial:   less than 140 mg/dL      Peak postprandial:   less than 180 mg/dL (1-2 hours)      Critically ill patients:  140 - 180 mg/dL    Latest Reference Range & Units 10/09/21 03:08  Glucose 70 - 99 mg/dL 341 (H)  Review of Glycemic Control  Diabetes history: DM2 Outpatient Diabetes medications: Toujeo 25 units QAM, Metformin XR 750 mg QAM Current orders for Inpatient glycemic control: None; in ED  Inpatient Diabetes Program Recommendations:    Insulin: If patient is admitted, please consider ordering Semglee 20 units Q24H, CBGs AC&HS, Novolog 0-15 units TID with meals, and Novolog 0-5 units QHS.  NOTE: Patient was recently inpatient 09/03/21-09/04/21; inpatient diabetes coordinator talked with patient's wife on 09/03/21 and educated on insulin administration so she could assist patient with insulin. Inpatient diabetes coordinator spoke with patient's daughter Vaughan Basta on 09/04/21 and she reported patient is not taking insulin consistently and he would not allow his daughter to help him. Initial glucose 341 mg/dl today on labs at 3:08 am.   Thanks, Barnie Alderman, RN, MSN, CDE Diabetes Coordinator Inpatient Diabetes Program [phone] (Team Pager from 8am to 5pm)

## 2021-10-11 ENCOUNTER — Emergency Department (HOSPITAL_COMMUNITY): Payer: Medicare Other

## 2021-10-11 ENCOUNTER — Observation Stay (HOSPITAL_COMMUNITY)
Admission: EM | Admit: 2021-10-11 | Discharge: 2021-10-15 | Disposition: A | Discharge status: Home / Self Care | Payer: Medicare Other | Attending: Internal Medicine | Admitting: Internal Medicine

## 2021-10-11 ENCOUNTER — Other Ambulatory Visit: Payer: Self-pay

## 2021-10-11 DIAGNOSIS — N179 Acute kidney failure, unspecified: Secondary | ICD-10-CM | POA: Diagnosis present

## 2021-10-11 DIAGNOSIS — I6782 Cerebral ischemia: Secondary | ICD-10-CM | POA: Insufficient documentation

## 2021-10-11 DIAGNOSIS — E782 Mixed hyperlipidemia: Secondary | ICD-10-CM | POA: Diagnosis present

## 2021-10-11 DIAGNOSIS — K219 Gastro-esophageal reflux disease without esophagitis: Secondary | ICD-10-CM | POA: Diagnosis present

## 2021-10-11 DIAGNOSIS — R531 Weakness: Secondary | ICD-10-CM | POA: Diagnosis present

## 2021-10-11 DIAGNOSIS — R29818 Other symptoms and signs involving the nervous system: Secondary | ICD-10-CM | POA: Diagnosis not present

## 2021-10-11 DIAGNOSIS — G250 Essential tremor: Secondary | ICD-10-CM | POA: Diagnosis not present

## 2021-10-11 DIAGNOSIS — I2510 Atherosclerotic heart disease of native coronary artery without angina pectoris: Secondary | ICD-10-CM | POA: Insufficient documentation

## 2021-10-11 DIAGNOSIS — Z85038 Personal history of other malignant neoplasm of large intestine: Secondary | ICD-10-CM | POA: Insufficient documentation

## 2021-10-11 DIAGNOSIS — N1832 Chronic kidney disease, stage 3b: Secondary | ICD-10-CM

## 2021-10-11 DIAGNOSIS — Z794 Long term (current) use of insulin: Secondary | ICD-10-CM | POA: Insufficient documentation

## 2021-10-11 DIAGNOSIS — I639 Cerebral infarction, unspecified: Secondary | ICD-10-CM

## 2021-10-11 DIAGNOSIS — I10 Essential (primary) hypertension: Secondary | ICD-10-CM | POA: Diagnosis present

## 2021-10-11 DIAGNOSIS — Z79899 Other long term (current) drug therapy: Secondary | ICD-10-CM | POA: Insufficient documentation

## 2021-10-11 DIAGNOSIS — E119 Type 2 diabetes mellitus without complications: Secondary | ICD-10-CM | POA: Diagnosis not present

## 2021-10-11 DIAGNOSIS — R29898 Other symptoms and signs involving the musculoskeletal system: Secondary | ICD-10-CM

## 2021-10-11 DIAGNOSIS — U071 COVID-19: Secondary | ICD-10-CM | POA: Diagnosis not present

## 2021-10-11 DIAGNOSIS — Z8546 Personal history of malignant neoplasm of prostate: Secondary | ICD-10-CM | POA: Diagnosis not present

## 2021-10-11 DIAGNOSIS — M25511 Pain in right shoulder: Secondary | ICD-10-CM

## 2021-10-11 DIAGNOSIS — E1122 Type 2 diabetes mellitus with diabetic chronic kidney disease: Secondary | ICD-10-CM | POA: Diagnosis not present

## 2021-10-11 DIAGNOSIS — R251 Tremor, unspecified: Secondary | ICD-10-CM | POA: Diagnosis not present

## 2021-10-11 DIAGNOSIS — Z7984 Long term (current) use of oral hypoglycemic drugs: Secondary | ICD-10-CM | POA: Diagnosis not present

## 2021-10-11 DIAGNOSIS — Z91040 Latex allergy status: Secondary | ICD-10-CM | POA: Insufficient documentation

## 2021-10-11 DIAGNOSIS — E1165 Type 2 diabetes mellitus with hyperglycemia: Secondary | ICD-10-CM

## 2021-10-11 DIAGNOSIS — M5412 Radiculopathy, cervical region: Principal | ICD-10-CM | POA: Diagnosis present

## 2021-10-11 LAB — COMPREHENSIVE METABOLIC PANEL
ALT: 18 U/L (ref 0–44)
AST: 21 U/L (ref 15–41)
Albumin: 4.2 g/dL (ref 3.5–5.0)
Alkaline Phosphatase: 61 U/L (ref 38–126)
Anion gap: 12 (ref 5–15)
BUN: 23 mg/dL (ref 8–23)
CO2: 24 mmol/L (ref 22–32)
Calcium: 9 mg/dL (ref 8.9–10.3)
Chloride: 98 mmol/L (ref 98–111)
Creatinine, Ser: 1.93 mg/dL — ABNORMAL HIGH (ref 0.61–1.24)
GFR, Estimated: 34 mL/min — ABNORMAL LOW (ref >60)
Glucose, Bld: 272 mg/dL — ABNORMAL HIGH (ref 70–99)
Potassium: 4.7 mmol/L (ref 3.5–5.1)
Sodium: 134 mmol/L — ABNORMAL LOW (ref 135–145)
Total Bilirubin: 0.9 mg/dL (ref 0.3–1.2)
Total Protein: 6.9 g/dL (ref 6.5–8.1)

## 2021-10-11 LAB — URINALYSIS, ROUTINE W REFLEX MICROSCOPIC
Bacteria, UA: NONE SEEN
Bilirubin Urine: NEGATIVE
Glucose, UA: 150 mg/dL — AB
Hgb urine dipstick: NEGATIVE
Ketones, ur: NEGATIVE mg/dL
Nitrite: NEGATIVE
Protein, ur: NEGATIVE mg/dL
Specific Gravity, Urine: 1.013 (ref 1.005–1.030)
pH: 5 (ref 5.0–8.0)

## 2021-10-11 LAB — CBC WITH DIFFERENTIAL/PLATELET
Abs Immature Granulocytes: 0.07 10*3/uL (ref 0.00–0.07)
Basophils Absolute: 0.1 10*3/uL (ref 0.0–0.1)
Basophils Relative: 1 %
Eosinophils Absolute: 0.1 10*3/uL (ref 0.0–0.5)
Eosinophils Relative: 2 %
HCT: 44.3 % (ref 39.0–52.0)
Hemoglobin: 14.8 g/dL (ref 13.0–17.0)
Immature Granulocytes: 1 %
Lymphocytes Relative: 14 %
Lymphs Abs: 0.9 10*3/uL (ref 0.7–4.0)
MCH: 31.5 pg (ref 26.0–34.0)
MCHC: 33.4 g/dL (ref 30.0–36.0)
MCV: 94.3 fL (ref 80.0–100.0)
Monocytes Absolute: 0.3 10*3/uL (ref 0.1–1.0)
Monocytes Relative: 5 %
Neutro Abs: 4.9 10*3/uL (ref 1.7–7.7)
Neutrophils Relative %: 77 %
Platelets: 125 10*3/uL — ABNORMAL LOW (ref 150–400)
RBC: 4.7 MIL/uL (ref 4.22–5.81)
RDW: 14.9 % (ref 11.5–15.5)
WBC: 6.3 10*3/uL (ref 4.0–10.5)
nRBC: 0 % (ref 0.0–0.2)

## 2021-10-11 LAB — RAPID URINE DRUG SCREEN, HOSP PERFORMED
Amphetamines: NOT DETECTED
Barbiturates: NOT DETECTED
Benzodiazepines: NOT DETECTED
Cocaine: NOT DETECTED
Opiates: NOT DETECTED
Tetrahydrocannabinol: NOT DETECTED

## 2021-10-11 LAB — RESP PANEL BY RT-PCR (FLU A&B, COVID) ARPGX2
Influenza A by PCR: NEGATIVE
Influenza B by PCR: NEGATIVE
SARS Coronavirus 2 by RT PCR: POSITIVE — AB

## 2021-10-11 LAB — ETHANOL: Alcohol, Ethyl (B): <10 mg/dL (ref <10)

## 2021-10-11 LAB — APTT: aPTT: 32 seconds (ref 24–36)

## 2021-10-11 LAB — GLUCOSE, CAPILLARY: Glucose-Capillary: 237 mg/dL — ABNORMAL HIGH (ref 70–99)

## 2021-10-11 LAB — MAGNESIUM: Magnesium: 1.4 mg/dL — ABNORMAL LOW (ref 1.7–2.4)

## 2021-10-11 LAB — PROTIME-INR
INR: 1 (ref 0.8–1.2)
Prothrombin Time: 13.1 seconds (ref 11.4–15.2)

## 2021-10-11 MED ORDER — PROPRANOLOL HCL ER 60 MG PO CP24
60.0000 mg | ORAL_CAPSULE | Freq: Every day | ORAL | Status: DC
  Administered 2021-10-12 – 2021-10-14 (×4): 60 mg via ORAL
  Filled 2021-10-11 (×6): qty 1

## 2021-10-11 MED ORDER — GABAPENTIN 300 MG PO CAPS
300.0000 mg | ORAL_CAPSULE | Freq: Every day | ORAL | Status: DC
  Administered 2021-10-12 – 2021-10-15 (×4): 300 mg via ORAL
  Filled 2021-10-11 (×4): qty 1

## 2021-10-11 MED ORDER — ACETAMINOPHEN 650 MG RE SUPP: 650.0000 mg | RECTAL | Status: DC | PRN

## 2021-10-11 MED ORDER — ATORVASTATIN CALCIUM 40 MG PO TABS
40.0000 mg | ORAL_TABLET | Freq: Every day | ORAL | Status: DC
  Administered 2021-10-12: 40 mg via ORAL
  Filled 2021-10-11: qty 1

## 2021-10-11 MED ORDER — INSULIN ASPART 100 UNIT/ML IJ SOLN
0.0000 [IU] | Freq: Every day | INTRAMUSCULAR | Status: DC
  Administered 2021-10-11: 2 [IU] via SUBCUTANEOUS
  Administered 2021-10-12: 3 [IU] via SUBCUTANEOUS

## 2021-10-11 MED ORDER — LORAZEPAM 0.5 MG PO TABS
0.5000 mg | ORAL_TABLET | Freq: Every day | ORAL | Status: DC
  Administered 2021-10-11 – 2021-10-14 (×4): 0.5 mg via ORAL
  Filled 2021-10-11 (×4): qty 1

## 2021-10-11 MED ORDER — SENNOSIDES-DOCUSATE SODIUM 8.6-50 MG PO TABS: 1.0000 | ORAL_TABLET | Freq: Every evening | ORAL | Status: DC | PRN

## 2021-10-11 MED ORDER — INSULIN ASPART 100 UNIT/ML IJ SOLN
0.0000 [IU] | Freq: Three times a day (TID) | INTRAMUSCULAR | Status: DC
  Administered 2021-10-12: 2 [IU] via SUBCUTANEOUS
  Administered 2021-10-12 (×2): 3 [IU] via SUBCUTANEOUS
  Administered 2021-10-13: 11 [IU] via SUBCUTANEOUS
  Administered 2021-10-13: 15 [IU] via SUBCUTANEOUS

## 2021-10-11 MED ORDER — MOMETASONE FURO-FORMOTEROL FUM 200-5 MCG/ACT IN AERO
2.0000 | INHALATION_SPRAY | Freq: Two times a day (BID) | RESPIRATORY_TRACT | Status: DC
  Administered 2021-10-11 – 2021-10-15 (×7): 2 via RESPIRATORY_TRACT
  Filled 2021-10-11: qty 8.8

## 2021-10-11 MED ORDER — TEMAZEPAM 15 MG PO CAPS: 15.0000 mg | ORAL_CAPSULE | Freq: Every evening | ORAL | Status: DC | PRN

## 2021-10-11 MED ORDER — STROKE: EARLY STAGES OF RECOVERY BOOK
Freq: Once | Status: AC
  Administered 2021-10-11: 1

## 2021-10-11 MED ORDER — MAGNESIUM SULFATE 2 GM/50ML IV SOLN
2.0000 g | Freq: Once | INTRAVENOUS | Status: AC
  Administered 2021-10-11: 2 g via INTRAVENOUS
  Filled 2021-10-11: qty 50

## 2021-10-11 MED ORDER — HEPARIN SODIUM (PORCINE) 5000 UNIT/ML IJ SOLN
5000.0000 [IU] | Freq: Three times a day (TID) | INTRAMUSCULAR | Status: DC
  Administered 2021-10-11 – 2021-10-15 (×11): 5000 [IU] via SUBCUTANEOUS
  Filled 2021-10-11 (×11): qty 1

## 2021-10-11 MED ORDER — GABAPENTIN 300 MG PO CAPS
600.0000 mg | ORAL_CAPSULE | Freq: Every day | ORAL | Status: DC
  Administered 2021-10-11 – 2021-10-14 (×4): 600 mg via ORAL
  Filled 2021-10-11 (×4): qty 2

## 2021-10-11 MED ORDER — MECLIZINE HCL 12.5 MG PO TABS
25.0000 mg | ORAL_TABLET | Freq: Two times a day (BID) | ORAL | Status: DC | PRN
  Administered 2021-10-14: 25 mg via ORAL
  Filled 2021-10-11: qty 2

## 2021-10-11 MED ORDER — ACETAMINOPHEN 325 MG PO TABS: 650.0000 mg | ORAL_TABLET | ORAL | Status: DC | PRN

## 2021-10-11 MED ORDER — IPRATROPIUM BROMIDE 0.06 % NA SOLN
2.0000 | Freq: Two times a day (BID) | NASAL | Status: DC
  Administered 2021-10-12 – 2021-10-14 (×3): 2 via NASAL
  Filled 2021-10-11: qty 15

## 2021-10-11 MED ORDER — MELATONIN 3 MG PO TABS
6.0000 mg | ORAL_TABLET | Freq: Every evening | ORAL | Status: DC | PRN
  Administered 2021-10-12 – 2021-10-13 (×2): 6 mg via ORAL
  Filled 2021-10-11 (×2): qty 2

## 2021-10-11 MED ORDER — CEPHALEXIN 500 MG PO CAPS
500.0000 mg | ORAL_CAPSULE | Freq: Three times a day (TID) | ORAL | Status: DC
  Administered 2021-10-11 – 2021-10-12 (×3): 500 mg via ORAL
  Filled 2021-10-11 (×4): qty 1

## 2021-10-11 MED ORDER — INSULIN DETEMIR 100 UNIT/ML ~~LOC~~ SOLN
15.0000 [IU] | Freq: Every day | SUBCUTANEOUS | Status: DC
Start: 2021-10-11 — End: 2021-10-13
  Administered 2021-10-11 – 2021-10-12 (×2): 15 [IU] via SUBCUTANEOUS
  Filled 2021-10-11 (×3): qty 0.15

## 2021-10-11 MED ORDER — SODIUM CHLORIDE 0.9 % IV SOLN: INTRAVENOUS | Status: DC

## 2021-10-11 MED ORDER — GABAPENTIN 300 MG PO CAPS: 300.0000 mg | ORAL_CAPSULE | Freq: Every day | ORAL | Status: DC

## 2021-10-11 MED ORDER — ACETAMINOPHEN 160 MG/5ML PO SOLN: 650.0000 mg | ORAL | Status: DC | PRN

## 2021-10-11 MED ORDER — NIRMATRELVIR/RITONAVIR (PAXLOVID) TABLET (RENAL DOSING)
2.0000 | ORAL_TABLET | Freq: Two times a day (BID) | ORAL | Status: DC
  Administered 2021-10-12 – 2021-10-15 (×8): 2 via ORAL
  Filled 2021-10-11: qty 20

## 2021-10-11 NOTE — Progress Notes (Signed)
Covid test positive. Dr.Zierle-Ghosh notified. Patient educated and placed on precautions.

## 2021-10-11 NOTE — H&P (Signed)
TRH H&P    Patient Demographics:    [name], is a 87 y.o. male  MRN: [id number]  DOB - 1935-04-06  Admit Date - 10/11/2021  Referring MD/NP/PA: Sabra Heck  Outpatient Primary MD for the patient is Celene Squibb, MD  Patient coming from: Home  Chief complaint- Right side weakness, tremor   HPI:    [name]  is a 87 y.o. male, with history of B12 deficiency, colon cancer, coronary artery disease, hypertension, GERD, hyperlipidemia, peripheral vascular disease, type 2 diabetes mellitus, and more presents to ED with a chief complaint of right-sided weakness and tremor.  Of note patient has had bilateral upper extremity tremors for at least a year.  He has been following with neurology.  He had been put on propanolol for these tremors, he stopped taking the propanolol 3 days ago.  He reports that no doctor advised him to stop taking the propanolol, he did not because he just had too many pills to take.  Yesterday patient reports that he started having worsening tremors.  Where before the tremors had been symmetric, this time they were right-sided.  It happens intermittently.  The episodes have been becoming more frequent and more intense since the onset yesterday.  He also has dizziness whenever he sits up.  Patient reports that he has not been able to walk because he keeps falling to the right.  The most he was able to do was take a few steps with his walker today.  He felt very unsafe and unsteady in those few steps.  Patient reports that he does have right-sided weakness but only when he has the tremor.  He also has associated cramping pain from his hand up to his neck when the tremor is happening.  He reports all of his symptoms resolved when he lays down, and become worse when he sits up.  He has not been nauseous.  Patient does report 1 week of right-sided flank pain as well.  He was prescribed Keflex for  possible UTI.  Urine culture pending.  Patient denies dysuria or hematuria.  He does report decreased urine output from normal.  He has had a normal appetite per his report.  Patient does not smoke, does not drink alcohol, does not use illicit drugs.  He is vaccinated for COVID.  Patient is full code.  In the ED 7.8, heart rate 95, respiratory rate 19-21, blood pressure 154/75, satting 94% No leukocytosis with a white blood cell count of 6.3, hemoglobin 14.8, platelets 125 Chemistry panel reveals an elevated creatinine 1.94, normal for him seems to be around 1.4 Hypomagnesemia 1.4 Hyperglycemia 272 Alcohol level less than 10 CT head shows no acute intracranial abnormality Admission requested for stroke work-up, and MRI of brain   Review of systems:    In addition to the HPI above,  No Fever-chills, No Headache, admits to dizziness, no changes in hearing No problems swallowing food or Liquids, No Chest pain, Cough or Shortness of Breath, No Abdominal pain, No Nausea or Vomiting, bowel movements are regular,  No Blood in stool or Urine, No dysuria, No new skin rashes or bruises, No new joints pains-aches,  No new tingling, numbness in any extremity, No recent weight gain or loss, No polyuria, polydypsia or polyphagia, No significant Mental Stressors.  All other systems reviewed and are negative.    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, anomalous circumflex - no PCIs   Essential hypertension, benign    GERD (gastroesophageal reflux disease)    Mixed hyperlipidemia    Obstructive sleep apnea    does not use   Prostate cancer ([place])    PVD (peripheral vascular disease) ([place])    Type 2 diabetes mellitus ([place])       Past Surgical History:  Procedure Laterality Date   BACK SURGERY      BALLOON DILATION N/A 08/27/2013   Procedure: BALLOON DILATION;  Surgeon: Rogene Houston, MD;  [facility];  Service: Endoscopy;  Laterality: N/A;   COLON SURGERY     COLONOSCOPY N/A 12/17/2013   Procedure: COLONOSCOPY;  Surgeon: Rogene Houston, MD;  Location[facility];  Service: Endoscopy;  Laterality: N/A;  940   COLONOSCOPY N/A 02/28/2014   Procedure: COLONOSCOPY;  Surgeon: Rogene Houston, MD;  [facility];  Service: Endoscopy;  Laterality: N/A;  730   COLONOSCOPY N/A 08/04/2015   Procedure: COLONOSCOPY;  Surgeon: Rogene Houston, MD;  [facility];  Service: Endoscopy;  Laterality: N/A;  200 - moved to 11/11 @ 2:10 - Ann to notify pt   COLONOSCOPY WITH ESOPHAGOGASTRODUODENOSCOPY (EGD) N/A 08/27/2013   Procedure: COLONOSCOPY WITH ESOPHAGOGASTRODUODENOSCOPY (EGD);  Surgeon: Rogene Houston, MD;  [facility];  Service: Endoscopy;  Laterality: N/A;  730   CYSTOSCOPY N/A 06/09/2021   Procedure: CYSTOSCOPY;  Surgeon: Lucas Mallow, MD;  Location: [facility];  Service: Urology;  Laterality: N/A;   IR REMOVAL TUN ACCESS W/ PORT W/O FL MOD SED  07/17/2018   KNEE ARTHROSCOPY Left    KNEE SURGERY Right    total knee   MALONEY DILATION N/A 08/27/2013   Procedure: MALONEY DILATION;  Surgeon: Rogene Houston, MD;  [facility];  Service: Endoscopy;  Laterality: N/A;   PORTACATH PLACEMENT Right 09/23/2012   PROSTATECTOMY     REMOVAL OF PENILE PROSTHESIS N/A 06/09/2021   Procedure: REMOVAL OF ARTIFICIAL URINARY SPHINCER; REMOVAL OF INFLATABLE PENILE PROSTHESIS;  Surgeon: Lucas Mallow, MD;  Location[facility];  Service: Urology;  Laterality: N/A;   removal of port Right    SAVORY DILATION N/A 08/27/2013   Procedure: SAVORY DILATION;  Surgeon: Rogene Houston, MD;  [facility];  Service: Endoscopy;  Laterality: N/A;   SHOULDER SURGERY     TONSILLECTOMY     URINARY SPHINCTER IMPLANT N/A 01/23/2017   artificial urinary  sphincter implant by Dr. Odis Luster   VASECTOMY        Social History:      Social History   Tobacco Use   Smoking status: Never   Smokeless tobacco: Never  Substance Use Topics   Alcohol use: No       Family History :     Family History  Problem Relation Age of Onset   Colon cancer Brother  Cancer Brother    Diabetes Father    Cancer Brother       Home Medications:   Prior to Admission medications   Medication Sig Start Date End Date Taking? Authorizing Provider  acetaminophen (TYLENOL) 325 MG tablet Take 650 mg by mouth every 4 (four) hours as needed for mild pain or moderate pain. Take 2 tablets 1 hour prior to Rituxan treatment.   Yes [provider]  albuterol (PROAIR HFA) 108 (90 Base) MCG/ACT inhaler Inhale 2 puffs into the lungs at bedtime as needed for wheezing or shortness of breath. 05/12/20  Yes Tanda Rockers, MD  amLODipine (NORVASC) 5 MG tablet Take 1 tablet (5 mg total) by mouth daily. 09/12/20  Yes Burtis Junes, NP  atorvastatin (LIPITOR) 40 MG tablet Take 40 mg by mouth daily. 10/05/21  Yes [provider]  budesonide-formoterol (SYMBICORT) 160-4.5 MCG/ACT inhaler Inhale 2 puffs into the lungs 2 (two) times daily. 05/12/20  Yes Tanda Rockers, MD  cephALEXin (KEFLEX) 500 MG capsule Take 1 capsule (500 mg total) by mouth 4 (four) times daily. 10/09/21  Yes Mesner, Corene Cornea, MD  docusate sodium (COLACE) 100 MG capsule Take 200 mg by mouth daily as needed for mild constipation.   Yes [provider]  gabapentin (NEURONTIN) 300 MG capsule Take 1 cap in AM and 2 caps at night (300AM, 600PM) 02/15/21  Yes McCue, Janett Billow, NP  glipiZIDE (GLUCOTROL) 10 MG tablet Take 5 mg by mouth daily before breakfast.   Yes [provider]  hydrocortisone 2.5 % cream Apply 1 application topically as needed (itching).   Yes [provider]  ipratropium (ATROVENT) 0.06 % nasal spray Place 2 sprays into both nostrils 2 (two) times daily.   04/15/17  Yes [provider]  LORazepam (ATIVAN) 0.5 MG tablet Take 0.5 mg by mouth at bedtime.   Yes [provider]  meclizine (ANTIVERT) 25 MG tablet Take 1 tablet (25 mg total) by mouth 2 (two) times daily as needed for dizziness. 09/19/21  Yes McKenzie, Candee Furbish, MD  metFORMIN (GLUCOPHAGE XR) 750 MG 24 hr tablet Take 1 tablet (750 mg total) by mouth daily with breakfast. 09/04/21 09/04/22 Yes Barton Dubois, MD  pantoprazole (PROTONIX) 40 MG tablet TAKE 1 TABLET BY MOUTH 2 TIMES DAILY BEFORE A MEAL. Patient taking differently: Take 40 mg by mouth 2 (two) times daily. 05/08/20 10/11/21 Yes Harvel Quale, MD  propranolol (INNOPRAN XL) 80 MG 24 hr capsule Take 1 capsule (80 mg total) by mouth at bedtime. 08/20/21  Yes McCue, Janett Billow, NP  temazepam (RESTORIL) 15 MG capsule Take 15 mg by mouth at bedtime as needed for sleep. 06/26/20  Yes [provider]  testosterone cypionate (DEPOTESTOSTERONE CYPIONATE) 200 MG/ML injection Inject 200 mg into the muscle every 14 (fourteen) days.  04/29/16  Yes [provider]  TOUJEO SOLOSTAR 300 UNIT/ML Solostar Pen Inject 25 Units into the skin every morning. 06/08/21  Yes Johnson, Clanford L, MD  blood glucose meter kit and supplies KIT Dispense based on patient and insurance preference. Use at least three times a day to check BS levels and fluctuation. 09/04/21   Barton Dubois, MD  fluticasone [facility]) 50 MCG/ACT nasal spray Place 1 spray into both nostrils as needed for allergies or rhinitis. Patient not taking: Reported on 10/11/2021    [provider]  insulin aspart (NOVOLOG) 100 UNIT/ML injection Inject 20 Units into the skin 3 (three) times daily before meals.    12/13/11  [provider]  rosuvastatin (CRESTOR) 40 MG tablet Take 40 mg by mouth daily.    01/13/19  [provider]     Allergies:     Allergies  Allergen Reactions   Bee Venom Anaphylaxis   Adhesive [Tape] Hives    Ciprofloxacin     Unknown    Codeine     Unknown     Iodine     Unknown     Latex     Unknown     Povidone-Iodine     Unknown     Simvastatin     Unknown       Physical Exam:   Vitals  Blood pressure (!) 157/91, pulse 88, temperature (!) 97.5 F (36.4 C), temperature source Oral, resp. rate 19, height 5' 11" (1.803 m), weight 98.9 kg, SpO2 92 %.   1.  General: Patient lying supine in bed,  no acute distress   2. Psychiatric: Alert and oriented x 3, mood and behavior normal for situation, pleasant and cooperative with exam   3. Neurologic: Speech and language are normal, face is symmetric, moves all 4 extremities voluntarily, very subtly weak on the right upper extremity compared to the left, difficulty with finger-nose-finger on the right, difficulty with heel-to-shin on the right as well, equal sensation in all extremities   4. HEENMT:  Head is atraumatic, normocephalic, pupils reactive to light, neck is supple, trachea is midline, mucous membranes are moist   5. Respiratory : Lungs are clear to auscultation bilaterally without wheezing, rhonchi, rales, no cyanosis, no increase in work of breathing or accessory muscle use   6. Cardiovascular : Heart rate normal, rhythm is regular, no murmurs, rubs or gallops, no peripheral edema, peripheral pulses palpated   7. Gastrointestinal:  Abdomen is soft, nondistended, nontender to palpation bowel sounds active, no masses or organomegaly palpated   8. Skin:  Skin is warm, dry and intact without rashes, acute lesions, or ulcers on limited exam   9.Musculoskeletal:  No acute deformities or trauma, no asymmetry in tone, no peripheral edema, peripheral pulses palpated, no tenderness to palpation in the extremities     Data Review:    CBC Recent Labs  Lab 10/09/21 0308 10/11/21 1900  WBC 7.6 6.3  HGB 13.4 14.8  HCT 40.2 44.3  PLT 113* 125*  MCV 94.1 94.3  MCH 31.4 31.5  MCHC 33.3 33.4  RDW 15.2 14.9   LYMPHSABS 1.0 0.9  MONOABS 0.4 0.3  EOSABS 0.1 0.1  BASOSABS 0.1 0.1   ------------------------------------------------------------------------------------------------------------------  Results for orders placed or performed during the hospital encounter of 10/11/21 (from the past 48 hour(s))  CBC with Differential/Platelet     Status: Abnormal   Collection Time: 10/11/21  7:00 PM  Result Value Ref Range   WBC 6.3 4.0 - 10.5 K/uL   RBC 4.70 4.22 - 5.81 MIL/uL   Hemoglobin 14.8 13.0 - 17.0 g/dL   HCT 44.3 39.0 - 52.0 %   MCV 94.3 80.0 - 100.0 fL   MCH 31.5 26.0 - 34.0 pg   MCHC 33.4 30.0 - 36.0 g/dL   RDW 14.9 11.5 - 15.5 %   Platelets 125 (L) 150 - 400 K/uL    Comment: SPECIMEN CHECKED FOR CLOTS CONSISTENT WITH PREVIOUS RESULT REPEATED TO VERIFY    nRBC 0.0 0.0 - 0.2 %   Neutrophils Relative % 77 %   Neutro Abs 4.9 1.7 - 7.7 K/uL   Lymphocytes Relative 14 %   Lymphs Abs 0.9 0.7 -  4.0 K/uL   Monocytes Relative 5 %   Monocytes Absolute 0.3 0.1 - 1.0 K/uL   Eosinophils Relative 2 %   Eosinophils Absolute 0.1 0.0 - 0.5 K/uL   Basophils Relative 1 %   Basophils Absolute 0.1 0.0 - 0.1 K/uL   Immature Granulocytes 1 %   Abs Immature Granulocytes 0.07 0.00 - 0.07 K/uL    Comment: Performed at [facility], [address] [place], [place] [address]  Comprehensive metabolic panel     Status: Abnormal   Collection Time: 10/11/21  7:00 PM  Result Value Ref Range   Sodium 134 (L) 135 - 145 mmol/L   Potassium 4.7 3.5 - 5.1 mmol/L   Chloride 98 98 - 111 mmol/L   CO2 24 22 - 32 mmol/L   Glucose, Bld 272 (H) 70 - 99 mg/dL    Comment: Glucose reference range applies only to samples taken after fasting for at least 8 hours.   BUN 23 8 - 23 mg/dL   Creatinine, Ser 1.93 (H) 0.61 - 1.24 mg/dL   Calcium 9.0 8.9 - 10.3 mg/dL   Total Protein 6.9 6.5 - 8.1 g/dL   Albumin 4.2 3.5 - 5.0 g/dL   AST 21 15 - 41 U/L   ALT 18 0 - 44 U/L   Alkaline Phosphatase 61 38 - 126 U/L   Total  Bilirubin 0.9 0.3 - 1.2 mg/dL   GFR, Estimated 34 (L) >60 mL/min    Comment: (NOTE) Calculated using the CKD-EPI Creatinine Equation (2021)    Anion gap 12 5 - 15    Comment: Performed at [facility], [address] [place], [place] [address]  Magnesium     Status: Abnormal   Collection Time: 10/11/21  7:00 PM  Result Value Ref Range   Magnesium 1.4 (L) 1.7 - 2.4 mg/dL    Comment: Performed at [facility], [address] [place], [place] [address]  Ethanol     Status: None   Collection Time: 10/11/21  7:00 PM  Result Value Ref Range   Alcohol, Ethyl (B) <10 <10 mg/dL    Comment: (NOTE) Lowest detectable limit for serum alcohol is 10 mg/dL.  For medical purposes only. Performed at [facility], [address], [place], [place] [address]   Protime-INR     Status: None   Collection Time: 10/11/21  7:00 PM  Result Value Ref Range   Prothrombin Time 13.1 11.4 - 15.2 seconds   INR 1.0 0.8 - 1.2    Comment: (NOTE) INR goal varies based on device and disease states. Performed at [facility], [address], [place], [place] [address]   APTT     Status: None   Collection Time: 10/11/21  7:00 PM  Result Value Ref Range   aPTT 32 24 - 36 seconds    Comment: Performed at [facility], [address] [place], [place] [address]  Resp Panel by RT-PCR (Flu A&B, Covid) Nasopharyngeal Swab     Status: Abnormal   Collection Time: 10/11/21  7:12 PM   Specimen: Nasopharyngeal Swab; Nasopharyngeal(NP) swabs in vial transport medium  Result Value Ref Range   SARS Coronavirus 2 by RT PCR POSITIVE (A) NEGATIVE    Comment: (NOTE) SARS-CoV-2 target nucleic acids are DETECTED.  The SARS-CoV-2 RNA is generally detectable in upper respiratory specimens during the acute phase of infection. Positive results are indicative of the presence of the identified virus, but do not rule out bacterial infection or co-infection with other pathogens not  detected by the test. Clinical correlation with patient  history and other diagnostic information is necessary to determine patient infection status. The expected result is Negative.  Fact Sheet for Patients: EntrepreneurPulse.com.au  Fact Sheet for Healthcare Providers: IncredibleEmployment.be  This test is not yet approved or cleared by the [place] FDA and  has been authorized for detection and/or diagnosis of SARS-CoV-2 by FDA under an Emergency Use Authorization (EUA).  This EUA will remain in effect (meaning this test can be used) for the duration of  the COVID-19 declaration under Section 564(b)(1) of the A ct, 21 U.S.C. section 360bbb-3(b)(1), unless the authorization is terminated or revoked sooner.     Influenza A by PCR NEGATIVE NEGATIVE   Influenza B by PCR NEGATIVE NEGATIVE    Comment: (NOTE) The Xpert Xpress SARS-CoV-2/FLU/RSV plus assay is intended as an aid in the diagnosis of influenza from Nasopharyngeal swab specimens and should not be used as a sole basis for treatment. Nasal washings and aspirates are unacceptable for Xpert Xpress SARS-CoV-2/FLU/RSV testing.  Fact Sheet for Patients: EntrepreneurPulse.com.au  Fact Sheet for Healthcare Providers: IncredibleEmployment.be  This test is not yet approved or cleared by the [place] FDA and has been authorized for detection and/or diagnosis of SARS-CoV-2 by FDA under an Emergency Use Authorization (EUA). This EUA will remain in effect (meaning this test can be used) for the duration of the COVID-19 declaration under Section 564(b)(1) of the Act, 21 U.S.C. section 360bbb-3(b)(1), unless the authorization is terminated or revoked.  Performed at [facility], [address], [place], [place] [address]   Urinalysis, Routine w reflex microscopic Urine, Clean Catch     Status: Abnormal   Collection Time: 10/11/21  7:47 PM  Result Value Ref Range   Color, Urine YELLOW YELLOW   APPearance  CLEAR CLEAR   Specific Gravity, Urine 1.013 1.005 - 1.030   pH 5.0 5.0 - 8.0   Glucose, UA 150 (A) NEGATIVE mg/dL   Hgb urine dipstick NEGATIVE NEGATIVE   Bilirubin Urine NEGATIVE NEGATIVE   Ketones, ur NEGATIVE NEGATIVE mg/dL   Protein, ur NEGATIVE NEGATIVE mg/dL   Nitrite NEGATIVE NEGATIVE   Leukocytes,Ua TRACE (A) NEGATIVE   RBC / HPF 0-5 0 - 5 RBC/hpf   WBC, UA 11-20 0 - 5 WBC/hpf   Bacteria, UA NONE SEEN NONE SEEN   Squamous Epithelial / LPF 0-5 0 - 5    Comment: Performed at [facility], [address], [place], [place] [address]  Urine rapid drug screen (hosp performed)     Status: None   Collection Time: 10/11/21  7:47 PM  Result Value Ref Range   Opiates NONE DETECTED NONE DETECTED   Cocaine NONE DETECTED NONE DETECTED   Benzodiazepines NONE DETECTED NONE DETECTED   Amphetamines NONE DETECTED NONE DETECTED   Tetrahydrocannabinol NONE DETECTED NONE DETECTED   Barbiturates NONE DETECTED NONE DETECTED    Comment: (NOTE) DRUG SCREEN FOR MEDICAL PURPOSES ONLY.  IF CONFIRMATION IS NEEDED FOR ANY PURPOSE, NOTIFY LAB WITHIN 5 DAYS.  LOWEST DETECTABLE LIMITS FOR URINE DRUG SCREEN Drug Class                     Cutoff (ng/mL) Amphetamine and metabolites    1000 Barbiturate and metabolites    200 Benzodiazepine                 759 Tricyclics and metabolites     300 Opiates and metabolites        300  Cocaine and metabolites        300 THC                            50 Performed at [facility], [address] [place], [place] [address]   Glucose, capillary     Status: Abnormal   Collection Time: 10/11/21  9:52 PM  Result Value Ref Range   Glucose-Capillary 237 (H) 70 - 99 mg/dL    Comment: Glucose reference range applies only to samples taken after fasting for at least 8 hours.    Chemistries  Recent Labs  Lab 10/09/21 0308 10/11/21 1900  NA 135 134*  K 4.0 4.7  CL 104 98  CO2 22 24  GLUCOSE 341* 272*  BUN 31* 23  CREATININE 1.79* 1.93*  CALCIUM 8.5* 9.0  MG   --  1.4*  AST 20 21  ALT 15 18  ALKPHOS 68 61  BILITOT 0.7 0.9   ------------------------------------------------------------------------------------------------------------------  ------------------------------------------------------------------------------------------------------------------ GFR: Estimated Creatinine Clearance: 34.1 mL/min (A) (by C-G formula based on SCr of 1.93 mg/dL (H)). Liver Function Tests: Recent Labs  Lab 10/09/21 0308 10/11/21 1900  AST 20 21  ALT 15 18  ALKPHOS 68 61  BILITOT 0.7 0.9  PROT 6.5 6.9  ALBUMIN 4.1 4.2   No results for input(s): LIPASE, AMYLASE in the last 168 hours. No results for input(s): AMMONIA in the last 168 hours. Coagulation Profile: Recent Labs  Lab 10/11/21 1900  INR 1.0   Cardiac Enzymes: No results for input(s): CKTOTAL, CKMB, CKMBINDEX, TROPONINI in the last 168 hours. BNP (last 3 results) No results for input(s): PROBNP in the last 8760 hours. HbA1C: No results for input(s): HGBA1C in the last 72 hours. CBG: Recent Labs  Lab 10/11/21 2152  GLUCAP 237*   Lipid Profile: No results for input(s): CHOL, HDL, LDLCALC, TRIG, CHOLHDL, LDLDIRECT in the last 72 hours. Thyroid Function Tests: No results for input(s): TSH, T4TOTAL, FREET4, T3FREE, THYROIDAB in the last 72 hours. Anemia Panel: No results for input(s): VITAMINB12, FOLATE, FERRITIN, TIBC, IRON, RETICCTPCT in the last 72 hours.  --------------------------------------------------------------------------------------------------------------- Urine analysis:    Component Value Date/Time   COLORURINE YELLOW 10/11/2021 1947   APPEARANCEUR CLEAR 10/11/2021 1947   LABSPEC 1.013 10/11/2021 1947   PHURINE 5.0 10/11/2021 1947   GLUCOSEU 150 (A) 10/11/2021 [place] NEGATIVE 10/11/2021 [place] NEGATIVE 10/11/2021 1947   BILIRUBINUR negative 05/04/2021 1702   KETONESUR NEGATIVE 10/11/2021 1947   PROTEINUR NEGATIVE 10/11/2021 1947   UROBILINOGEN  1.0 05/04/2021 1702   UROBILINOGEN 0.2 06/30/2015 1552   NITRITE NEGATIVE 10/11/2021 1947   LEUKOCYTESUR TRACE (A) 10/11/2021 1947      Imaging Results:    CT Head Wo Contrast  Result Date: 10/11/2021 CLINICAL DATA:  Neuro deficit, acute, stroke suspected R sided weakness EXAM: CT HEAD WITHOUT CONTRAST TECHNIQUE: Contiguous axial images were obtained from the base of the skull through the vertex without intravenous contrast. RADIATION DOSE REDUCTION: This exam was performed according to the departmental dose-optimization program which includes automated exposure control, adjustment of the mA and/or kV according to patient size and/or use of iterative reconstruction technique. COMPARISON:  None. BRAIN: BRAIN Cerebral ventricle sizes are concordant with the degree of cerebral volume loss. Patchy and confluent areas of decreased attenuation are noted throughout the deep and periventricular white matter of the cerebral hemispheres bilaterally, compatible with chronic microvascular ischemic disease. No evidence of large-territorial acute infarction. No  parenchymal hemorrhage. No mass lesion. No extra-axial collection. No mass effect or midline shift. No hydrocephalus. Basilar cisterns are patent. Vascular: No hyperdense vessel. Skull: No acute fracture or focal lesion. Sinuses/Orbits: Paranasal sinuses and mastoid air cells are clear. Bilateral lens replacement. Otherwise the orbits are unremarkable. Other: None. IMPRESSION: No acute intracranial abnormality. Electronically Signed   By: Iven Finn M.D.   On: 10/11/2021 19:47       Assessment & Plan:    Principal Problem:   CVA (cerebral vascular accident) ([place]) Active Problems:   Mixed hyperlipidemia   Hypertension   GERD (gastroesophageal reflux disease)   Acute kidney injury ([place])   Essential tremor   Type 2 diabetes mellitus with chronic kidney disease, with long-term current use of insulin ([place])   CVA work-up Well right-sided  weakness is only present with tremor, the tremor is different than his normal, and patient has not had a CVA work-up thus far CT head showed no acute intracranial abnormality MRI brain, echo, ultrasound carotids in the a.m. Consult neuro Patient passed swallow study, continue heart healthy diet Monitor on telemetry COVID-positive COVID-positive without respiratory symptoms or fever Paxlovid ordered Continue to monitor Hypertension Hold Norvasc for permissive hypertension given stroke work-up and onset of stroke symptoms approximately 24 hours prior Continue to monitor Hyperlipidemia Continue statin medication AKI Baseline creatinine around 1.4 Today creatinine 1.9 Patient has a history of urinary retention and reports decreased urine output -possibly postobstructive Bladder scan as needed Monitor intake and output Diabetes mellitus type 2 Continue reduced dose of long-acting insulin Continue sliding scale Monitor CBGs GERD Holding PPI in the setting of AKI Essential tremor Restart propanolol    DVT Prophylaxis-   Heparin - SCDs   AM Labs Ordered, also please review Full Orders  Family Communication: No family at bedside  Code Status: Full  Admission status: Observation Disposition: Anticipated Discharge date 24 hours discharge to home  Time spent in minutes : Emily DO

## 2021-10-11 NOTE — ED Provider Notes (Signed)
[facility] EMERGENCY DEPARTMENT Provider Note   CSN: [id number] Arrival date & time: 10/11/21  1813     History  Chief Complaint  Patient presents with   Weakness    [name] is a 87 y.o. male.   Weakness  This patient is an 87 year old male, history of hypertension, history of some type of tremor for which she is followed by Dr. Lavell Anchors with neurology the last visit was from August 20, 2021, he has been diagnosed with essential tremor.  He has been on propanolol, also follows with urology secondary to urinary retention and has had recurrent UTIs most recently treated with cephalexin a couple of days ago.  During his visit in November he did not have any tremor or significant abnormal neurologic findings.  He was seen in the emergency department 2 days ago because of a fall when he was actually having some hallucinations at 3:00 in the morning, he fell down some stairs, call the police, he was eventually transported, had a negative CT scan of the brain and was diagnosed with a recurrent UTI treated with cephalexin.  Since that time he has noted that there has been less hallucinations in fact his significant other with him at the bedside states that he has been doing very well today with regard to his mental status but has not been able to walk because of right-sided weakness, this was present when he woke up this morning, both right arm and right leg.  He also notes that the tremor in his arm is severe compared to what it used to be.  Home Medications Prior to Admission medications   Medication Sig Start Date End Date Taking? Authorizing Provider  acetaminophen (TYLENOL) 325 MG tablet Take 650 mg by mouth every 4 (four) hours as needed for mild pain or moderate pain. Take 2 tablets 1 hour prior to Rituxan treatment.    [provider]  albuterol (PROAIR HFA) 108 (90 Base) MCG/ACT inhaler Inhale 2 puffs into the lungs at bedtime as needed for wheezing or shortness of  breath. 05/12/20   Tanda Rockers, MD  amLODipine (NORVASC) 5 MG tablet Take 1 tablet (5 mg total) by mouth daily. 09/12/20   Burtis Junes, NP  blood glucose meter kit and supplies KIT Dispense based on patient and insurance preference. Use at least three times a day to check BS levels and fluctuation. 09/04/21   Barton Dubois, MD  budesonide-formoterol [facility]) 160-4.5 MCG/ACT inhaler Inhale 2 puffs into the lungs 2 (two) times daily. 05/12/20   Tanda Rockers, MD  cephALEXin (KEFLEX) 500 MG capsule Take 1 capsule (500 mg total) by mouth 4 (four) times daily. 10/09/21   Mesner, Corene Cornea, MD  docusate sodium (COLACE) 100 MG capsule Take 200 mg by mouth daily as needed for mild constipation.    [provider]  fluticasone (FLONASE) 50 MCG/ACT nasal spray Place 1 spray into both nostrils as needed for allergies or rhinitis.    [provider]  gabapentin (NEURONTIN) 300 MG capsule Take 1 cap in AM and 2 caps at night (300AM, 600PM) 02/15/21   Frann Rider, NP  hydrocortisone 2.5 % cream Apply 1 application topically as needed (itching).    [provider]  ipratropium (ATROVENT) 0.06 % nasal spray Place 2 sprays into both nostrils 2 (two) times daily.  04/15/17   [provider]  meclizine (ANTIVERT) 25 MG tablet Take 1 tablet (25 mg total) by mouth 2 (two) times daily as needed  for dizziness. 09/19/21   McKenzie, Candee Furbish, MD  metFORMIN (GLUCOPHAGE XR) 750 MG 24 hr tablet Take 1 tablet (750 mg total) by mouth daily with breakfast. 09/04/21 09/04/22  Barton Dubois, MD  pantoprazole (PROTONIX) 40 MG tablet TAKE 1 TABLET BY MOUTH 2 TIMES DAILY BEFORE A MEAL. Patient taking differently: Take 40 mg by mouth 2 (two) times daily. 05/08/20 09/04/21  Harvel Quale, MD  propranolol (INNOPRAN XL) 80 MG 24 hr capsule Take 1 capsule (80 mg total) by mouth at bedtime. 08/20/21   Frann Rider, NP  temazepam (RESTORIL) 15 MG capsule Take 15 mg by mouth at bedtime  as needed for sleep. 06/26/20   [provider]  testosterone cypionate (DEPOTESTOSTERONE CYPIONATE) 200 MG/ML injection Inject 200 mg into the muscle every 14 (fourteen) days.  04/29/16   [provider]  TOUJEO SOLOSTAR 300 UNIT/ML Solostar Pen Inject 25 Units into the skin every morning. 06/08/21   Johnson, Clanford L, MD  insulin aspart (NOVOLOG) 100 UNIT/ML injection Inject 20 Units into the skin 3 (three) times daily before meals.    12/13/11  [provider]  rosuvastatin (CRESTOR) 40 MG tablet Take 40 mg by mouth daily.    01/13/19  [provider]      Allergies    Bee venom, Adhesive [tape], Ciprofloxacin, Codeine, Iodine, Latex, Povidone-iodine, and Simvastatin    Review of Systems   Review of Systems  Neurological:  Positive for weakness.  All other systems reviewed and are negative.  Physical Exam Updated Vital Signs BP (!) 153/93 (BP Location: Left Arm)    Pulse 96    Temp 97.8 F (36.6 C) (Oral)    Resp 19    Ht 1.803 m (5' 11" )    Wt 98.9 kg    SpO2 92%    BMI 30.40 kg/m  Physical Exam Vitals and nursing note reviewed.  Constitutional:      General: He is not in acute distress.    Appearance: He is well-developed.  HENT:     Head: Normocephalic and atraumatic.     Mouth/Throat:     Pharynx: No oropharyngeal exudate.  Eyes:     General: No scleral icterus.       Right eye: No discharge.        Left eye: No discharge.     Conjunctiva/sclera: Conjunctivae normal.     Pupils: Pupils are equal, round, and reactive to light.  Neck:     Thyroid: No thyromegaly.     Vascular: No JVD.  Cardiovascular:     Rate and Rhythm: Normal rate and regular rhythm.     Heart sounds: Normal heart sounds. No murmur heard.   No friction rub. No gallop.  Pulmonary:     Effort: Pulmonary effort is normal. No respiratory distress.     Breath sounds: Normal breath sounds. No wheezing or rales.  Abdominal:     General: Bowel sounds are normal. There is  no distension.     Palpations: Abdomen is soft. There is no mass.     Tenderness: There is no abdominal tenderness.  Musculoskeletal:        General: No tenderness. Normal range of motion.     Cervical back: Normal range of motion and neck supple.     Right lower leg: No edema.     Left lower leg: No edema.  Lymphadenopathy:     Cervical: No cervical adenopathy.  Skin:    General: Skin is warm and  dry.     Findings: No erythema or rash.  Neurological:     Mental Status: He is alert.     Coordination: Coordination normal.     Comments: The patient is able to answer all my questions appropriately without any slurred speech, his peripheral visual fields are totally normal, he has the ability to perform finger-nose-finger but has significant and actually severe tremor of the right upper extremity.  He can do heel shin bilaterally without any difficulty.  He has no pronator drift.  There is no other cranial nerve abnormalities  Psychiatric:        Behavior: Behavior normal.    ED Results / Procedures / Treatments   Labs (all labs ordered are listed, but only abnormal results are displayed) Labs Reviewed  RESP PANEL BY RT-PCR (FLU A&B, COVID) ARPGX2  CBC WITH DIFFERENTIAL/PLATELET  COMPREHENSIVE METABOLIC PANEL  MAGNESIUM  URINALYSIS, ROUTINE W REFLEX MICROSCOPIC  ETHANOL  PROTIME-INR  APTT  RAPID URINE DRUG SCREEN, HOSP PERFORMED    EKG None  Radiology No results found.  Procedures Procedures    Medications Ordered in ED Medications - No data to display  ED Course/ Medical Decision Making/ A&P                           Medical Decision Making I have reviewed the patient's laboratory work-up from 2 days ago, he had 30,000 colonies of Pseudomonas and 30,000 colonies of E. coli, back in December he had over 100,000 colonies of E. coli that was almost pansensitive.  At this time the patient does not have fever tachycardia or hypotension.  I will need to recheck his urine  to make sure that is clean but we do not have the sensitivities back on the most recent collection.  That being said he did not have that many colony-forming units.  Will discuss with hospitalist regarding further stroke work-up, given this patient's complicated history he probably needs an MRI to make sure that he has not had strokes however he has had increasing weakness on the right arm and leg over the last at least 18 hours since last seen normal, he has more tremor and slight weakness on my exam than he is pure weakness and I do not think he needs tPA as that would be more harm than good given the length of time since symptoms started.  Will repeat CT scan however likely needs MRI  Amount and/or Complexity of Data Reviewed Independent Historian: caregiver    Details: Reports detailed history of the last several days including the history of hallucinations a couple of days ago External Data Reviewed: labs, radiology, ECG and notes.    Details: Prior CT scan imaging from head injury reviewed Labs: ordered.    Details: I personally interpreted the laboratory data, there is no acute finding, that explains patient's symptoms The patient is COVID-positive Slight hypomagnesemia Radiology: ordered and independent interpretation performed.    Details: No findings of acute hemorrhage on CT scan ECG/medicine tests: ordered and independent interpretation performed. Decision-making details documented in ED Course. Discussion of management or test interpretation with external provider(s): I discussed the findings with the patient, his family member as well as the hospitalist, they will admit for formal stroke rule out given that he has some right-sided weakness though now knowing that he is COVID-positive changes things a little bit as well.  Overall stable appearing but needs admission for focal finding  Risk Decision  regarding hospitalization.          Final Clinical Impression(s) / ED  Diagnoses Final diagnoses:  Right arm weakness  Tremor      Noemi Chapel, MD 10/11/21 2217

## 2021-10-11 NOTE — ED Notes (Signed)
Patient transported to CT 

## 2021-10-11 NOTE — ED Triage Notes (Signed)
Right side weakness onset yesterday

## 2021-10-12 ENCOUNTER — Observation Stay (HOSPITAL_COMMUNITY): Payer: Medicare Other

## 2021-10-12 ENCOUNTER — Encounter (HOSPITAL_COMMUNITY): Payer: Self-pay | Admitting: Family Medicine

## 2021-10-12 ENCOUNTER — Observation Stay (HOSPITAL_BASED_OUTPATIENT_CLINIC_OR_DEPARTMENT_OTHER): Payer: Medicare Other

## 2021-10-12 DIAGNOSIS — I951 Orthostatic hypotension: Secondary | ICD-10-CM

## 2021-10-12 DIAGNOSIS — M5412 Radiculopathy, cervical region: Secondary | ICD-10-CM | POA: Diagnosis not present

## 2021-10-12 DIAGNOSIS — R29898 Other symptoms and signs involving the musculoskeletal system: Secondary | ICD-10-CM | POA: Diagnosis not present

## 2021-10-12 DIAGNOSIS — N1832 Chronic kidney disease, stage 3b: Secondary | ICD-10-CM | POA: Diagnosis not present

## 2021-10-12 DIAGNOSIS — Z794 Long term (current) use of insulin: Secondary | ICD-10-CM | POA: Diagnosis not present

## 2021-10-12 DIAGNOSIS — G8929 Other chronic pain: Secondary | ICD-10-CM | POA: Diagnosis not present

## 2021-10-12 DIAGNOSIS — M25511 Pain in right shoulder: Secondary | ICD-10-CM | POA: Diagnosis not present

## 2021-10-12 DIAGNOSIS — I6389 Other cerebral infarction: Secondary | ICD-10-CM | POA: Diagnosis not present

## 2021-10-12 DIAGNOSIS — I10 Essential (primary) hypertension: Secondary | ICD-10-CM | POA: Diagnosis not present

## 2021-10-12 DIAGNOSIS — E782 Mixed hyperlipidemia: Secondary | ICD-10-CM | POA: Diagnosis not present

## 2021-10-12 DIAGNOSIS — K219 Gastro-esophageal reflux disease without esophagitis: Secondary | ICD-10-CM | POA: Diagnosis not present

## 2021-10-12 DIAGNOSIS — G250 Essential tremor: Secondary | ICD-10-CM | POA: Diagnosis not present

## 2021-10-12 DIAGNOSIS — E785 Hyperlipidemia, unspecified: Secondary | ICD-10-CM | POA: Diagnosis not present

## 2021-10-12 DIAGNOSIS — I6523 Occlusion and stenosis of bilateral carotid arteries: Secondary | ICD-10-CM | POA: Diagnosis not present

## 2021-10-12 DIAGNOSIS — N179 Acute kidney failure, unspecified: Secondary | ICD-10-CM | POA: Diagnosis not present

## 2021-10-12 DIAGNOSIS — Z8673 Personal history of transient ischemic attack (TIA), and cerebral infarction without residual deficits: Secondary | ICD-10-CM | POA: Diagnosis not present

## 2021-10-12 DIAGNOSIS — E119 Type 2 diabetes mellitus without complications: Secondary | ICD-10-CM | POA: Diagnosis not present

## 2021-10-12 DIAGNOSIS — M2578 Osteophyte, vertebrae: Secondary | ICD-10-CM | POA: Diagnosis not present

## 2021-10-12 DIAGNOSIS — E1122 Type 2 diabetes mellitus with diabetic chronic kidney disease: Secondary | ICD-10-CM | POA: Diagnosis not present

## 2021-10-12 DIAGNOSIS — I639 Cerebral infarction, unspecified: Secondary | ICD-10-CM | POA: Diagnosis not present

## 2021-10-12 LAB — ECHOCARDIOGRAM COMPLETE
AR max vel: 3.77 cm2
AV Area VTI: 3.5 cm2
AV Area mean vel: 3.25 cm2
AV Mean grad: 2 mmHg
AV Peak grad: 2.7 mmHg
Ao pk vel: 0.82 m/s
Area-P 1/2: 2.87 cm2
Calc EF: 53.1 %
Height: 71 in
MV VTI: 2.46 cm2
S' Lateral: 3.1 cm
Single Plane A2C EF: 53.3 %
Single Plane A4C EF: 52.2 %
Weight: 3343.94 oz

## 2021-10-12 LAB — COMPREHENSIVE METABOLIC PANEL
ALT: 13 U/L (ref 0–44)
AST: 13 U/L — ABNORMAL LOW (ref 15–41)
Albumin: 3.7 g/dL (ref 3.5–5.0)
Alkaline Phosphatase: 56 U/L (ref 38–126)
Anion gap: 11 (ref 5–15)
BUN: 23 mg/dL (ref 8–23)
CO2: 26 mmol/L (ref 22–32)
Calcium: 9.1 mg/dL (ref 8.9–10.3)
Chloride: 99 mmol/L (ref 98–111)
Creatinine, Ser: 1.74 mg/dL — ABNORMAL HIGH (ref 0.61–1.24)
GFR, Estimated: 38 mL/min — ABNORMAL LOW (ref >60)
Glucose, Bld: 294 mg/dL — ABNORMAL HIGH (ref 70–99)
Potassium: 4.6 mmol/L (ref 3.5–5.1)
Sodium: 136 mmol/L (ref 135–145)
Total Bilirubin: 0.6 mg/dL (ref 0.3–1.2)
Total Protein: 6 g/dL — ABNORMAL LOW (ref 6.5–8.1)

## 2021-10-12 LAB — GLUCOSE, CAPILLARY
Glucose-Capillary: 178 mg/dL — ABNORMAL HIGH (ref 70–99)
Glucose-Capillary: 160 mg/dL — ABNORMAL HIGH (ref 70–99)
Glucose-Capillary: 129 mg/dL — ABNORMAL HIGH (ref 70–99)
Glucose-Capillary: 281 mg/dL — ABNORMAL HIGH (ref 70–99)

## 2021-10-12 LAB — URINE CULTURE: Culture: 30000 — AB

## 2021-10-12 LAB — HEMOGLOBIN A1C
Hgb A1c MFr Bld: 8.8 % — ABNORMAL HIGH (ref 4.8–5.6)
Mean Plasma Glucose: 206 mg/dL

## 2021-10-12 LAB — LIPID PANEL
Cholesterol: 123 mg/dL (ref 0–200)
HDL: 37 mg/dL — ABNORMAL LOW (ref >40)
LDL Cholesterol: 65 mg/dL (ref 0–99)
Total CHOL/HDL Ratio: 3.3 RATIO
Triglycerides: 103 mg/dL (ref <150)
VLDL: 21 mg/dL (ref 0–40)

## 2021-10-12 LAB — CBC
HCT: 42.2 % (ref 39.0–52.0)
Hemoglobin: 13.5 g/dL (ref 13.0–17.0)
MCH: 30 pg (ref 26.0–34.0)
MCHC: 32 g/dL (ref 30.0–36.0)
MCV: 93.8 fL (ref 80.0–100.0)
Platelets: 129 10*3/uL — ABNORMAL LOW (ref 150–400)
RBC: 4.5 MIL/uL (ref 4.22–5.81)
RDW: 14.9 % (ref 11.5–15.5)
WBC: 5.7 10*3/uL (ref 4.0–10.5)
nRBC: 0 % (ref 0.0–0.2)

## 2021-10-12 MED ORDER — METHYLPREDNISOLONE 4 MG PO TABS
8.0000 mg | ORAL_TABLET | Freq: Every morning | ORAL | Status: AC
  Administered 2021-10-12: 8 mg via ORAL

## 2021-10-12 MED ORDER — METHYLPREDNISOLONE 4 MG PO TABS: 4.0000 mg | ORAL_TABLET | Freq: Four times a day (QID) | ORAL | Status: DC

## 2021-10-12 MED ORDER — METHYLPREDNISOLONE 4 MG PO TABS: 8.0000 mg | ORAL_TABLET | Freq: Every evening | ORAL | Status: DC

## 2021-10-12 MED ORDER — METHYLPREDNISOLONE 4 MG PO TABS
8.0000 mg | ORAL_TABLET | Freq: Every evening | ORAL | Status: AC
  Administered 2021-10-12: 8 mg via ORAL

## 2021-10-12 MED ORDER — METHYLPREDNISOLONE 4 MG PO TABS
4.0000 mg | ORAL_TABLET | ORAL | Status: AC
  Administered 2021-10-12: 4 mg via ORAL
  Filled 2021-10-12: qty 1

## 2021-10-12 MED ORDER — CYCLOBENZAPRINE HCL 10 MG PO TABS: 5.0000 mg | ORAL_TABLET | Freq: Three times a day (TID) | ORAL | Status: DC | PRN

## 2021-10-12 MED ORDER — METHYLPREDNISOLONE 4 MG PO TABS
4.0000 mg | ORAL_TABLET | Freq: Three times a day (TID) | ORAL | Status: DC
  Administered 2021-10-13: 4 mg via ORAL
  Filled 2021-10-12 (×4): qty 1

## 2021-10-12 NOTE — Evaluation (Signed)
Physical Therapy Evaluation Patient Details Name: [name] MRN: [id number] DOB: [birth year] Today's Date: 10/12/2021  History of Present Illness  [name]  is a 87 y.o. male, with history of B12 deficiency, colon cancer, coronary artery disease, hypertension, GERD, hyperlipidemia, peripheral vascular disease, type 2 diabetes mellitus, and more presents to ED with a chief complaint of right-sided weakness and tremor.  Of note patient has had bilateral upper extremity tremors for at least a year.  He has been following with neurology.  He had been put on propanolol for these tremors, he stopped taking the propanolol 3 days ago.  He reports that no doctor advised him to stop taking the propanolol, he did not because he just had too many pills to take.  Yesterday patient reports that he started having worsening tremors.  Where before the tremors had been symmetric, this time they were right-sided.  It happens intermittently.  The episodes have been becoming more frequent and more intense since the onset yesterday.  He also has dizziness whenever he sits up.  Patient reports that he has not been able to walk because he keeps falling to the right.  The most he was able to do was take a few steps with his walker today.  He felt very unsafe and unsteady in those few steps.  Patient reports that he does have right-sided weakness but only when he has the tremor.  He also has associated cramping pain from his hand up to his neck when the tremor is happening.  He reports all of his symptoms resolved when he lays down, and become worse when he sits up.  He has not been nauseous.  Patient does report 1 week of right-sided flank pain as well.  He was prescribed Keflex for possible UTI.  Urine culture pending.  Patient denies dysuria or hematuria.  He does report decreased urine output from normal.  He has had a normal appetite per his report.   Clinical Impression  Patient limited for functional mobility as  stated below secondary to RLE weakness and balance deficits. Patient with grossly decreased RLE strength. He requires min A with transfers and ambulation for strength and balance deficit with use of RW and is unsteady with ambulation without loss of balance.  Patient will benefit from continued physical therapy in hospital and recommended venue below to increase strength, balance, endurance for safe ADLs and gait.        Recommendations for follow up therapy are one component of a multi-disciplinary discharge planning process, led by the attending physician.  Recommendations may be updated based on patient status, additional functional criteria and insurance authorization.  Follow Up Recommendations Skilled nursing-short term rehab (<3 hours/day)    Assistance Recommended at Discharge Frequent or constant Supervision/Assistance  Patient can return home with the following  A little help with walking and/or transfers;A lot of help with walking and/or transfers;A little help with bathing/dressing/bathroom    Equipment Recommendations None recommended by PT  Recommendations for Other Services       Functional Status Assessment Patient has had a recent decline in their functional status and demonstrates the ability to make significant improvements in function in a reasonable and predictable amount of time.     Precautions / Restrictions Precautions Precautions: Fall Restrictions Weight Bearing Restrictions: No      Mobility  Bed Mobility Overal bed mobility: Modified Independent                  Transfers Overall  transfer level: Needs assistance Equipment used: Rolling walker (2 wheels) Transfers: Sit to/from Stand Sit to Stand: Min assist           General transfer comment: labored transfer to standing with RW    Ambulation/Gait Ambulation/Gait assistance: Min guard, Min assist Gait Distance (Feet): 15 Feet Assistive device: Rolling walker (2 wheels) Gait  Pattern/deviations: Decreased stride length Gait velocity: decreased     General Gait Details: labored, unsteady cadence with RW  Stairs            Wheelchair Mobility    Modified Rankin (Stroke Patients Only)       Balance Overall balance assessment: Needs assistance Sitting-balance support: No upper extremity supported, Feet supported Sitting balance-Leahy Scale: Good Sitting balance - Comments: seated EOB   Standing balance support: Bilateral upper extremity supported Standing balance-Leahy Scale: Fair Standing balance comment: with RW                             Pertinent Vitals/Pain Pain Assessment Pain Assessment: Faces Faces Pain Scale: Hurts a little bit Pain Location: R trunk and shoulder Pain Descriptors / Indicators: Sore Pain Intervention(s): Limited activity within patient's tolerance, Monitored during session, Repositioned    Home Living Family/patient expects to be discharged to:: Private residence Living Arrangements: Spouse/significant other Available Help at Discharge: Family;Available 24 hours/day Type of Home: House Home Access: Stairs to enter Entrance Stairs-Rails: Can reach both Entrance Stairs-Number of Steps: 3 Alternate Level Stairs-Number of Steps: 14 Home Layout: Two level Home Equipment: Conservation officer, nature (2 wheels);Cane - single point      Prior Function Prior Level of Function : Independent/Modified Independent             Mobility Comments: Hydrographic surveyor, drives, "per patient" ADLs Comments: Independent, "per patient"     Hand Dominance   Dominant Hand: Right    Extremity/Trunk Assessment   Upper Extremity Assessment Upper Extremity Assessment: Defer to OT evaluation    Lower Extremity Assessment Lower Extremity Assessment: RLE deficits/detail RLE Deficits / Details: RLE grossly 4/5 MMT    Cervical / Trunk Assessment Cervical / Trunk Assessment: Normal  Communication   Communication: No  difficulties  Cognition Arousal/Alertness: Awake/alert Behavior During Therapy: WFL for tasks assessed/performed Overall Cognitive Status: Within Functional Limits for tasks assessed                                          General Comments      Exercises     Assessment/Plan    PT Assessment Patient needs continued PT services  PT Problem List Decreased strength;Decreased activity tolerance;Decreased balance;Decreased mobility       PT Treatment Interventions DME instruction;Therapeutic exercise;Gait training;Balance training;Stair training;Neuromuscular re-education;Functional mobility training;Therapeutic activities;Patient/family education    PT Goals (Current goals can be found in the Care Plan section)  Acute Rehab PT Goals Patient Stated Goal: Figure out what is wrong with R side PT Goal Formulation: With patient Time For Goal Achievement: 10/26/21 Potential to Achieve Goals: Good    Frequency Min 3X/week     Co-evaluation PT/OT/SLP Co-Evaluation/Treatment: Yes Reason for Co-Treatment: To address functional/ADL transfers PT goals addressed during session: Mobility/safety with mobility;Balance;Strengthening/ROM OT goals addressed during session: ADL's and self-care       AM-PAC PT "6 Clicks" Mobility  Outcome Measure Help needed turning from your back to  your side while in a flat bed without using bedrails?: A Little Help needed moving from lying on your back to sitting on the side of a flat bed without using bedrails?: A Little Help needed moving to and from a bed to a chair (including a wheelchair)?: A Little Help needed standing up from a chair using your arms (e.g., wheelchair or bedside chair)?: A Little Help needed to walk in hospital room?: A Little Help needed climbing 3-5 steps with a railing? : A Lot 6 Click Score: 17    End of Session Equipment Utilized During Treatment: Gait belt Activity Tolerance: Patient tolerated treatment  well Patient left: in chair;with call bell/phone within reach Nurse Communication: Mobility status PT Visit Diagnosis: Unsteadiness on feet (R26.81);Other abnormalities of gait and mobility (R26.89);Muscle weakness (generalized) (M62.81)    Time: 1610-9604 PT Time Calculation (min) (ACUTE ONLY): 34 min   Charges:   PT Evaluation $PT Eval Moderate Complexity: 1 Mod PT Treatments $Therapeutic Activity: 8-22 mins        10:11 AM, 10/12/21 Mearl Latin PT, DPT Physical Therapist at [facility]

## 2021-10-12 NOTE — Plan of Care (Signed)
°  Problem: Acute Rehab PT Goals(only PT should resolve) Goal: Patient Will Transfer Sit To/From Stand Outcome: Progressing Flowsheets (Taken 10/12/2021 1013) Patient will transfer sit to/from stand: with modified independence Goal: Pt Will Transfer Bed To Chair/Chair To Bed Outcome: Progressing Flowsheets (Taken 10/12/2021 1013) Pt will Transfer Bed to Chair/Chair to Bed: with modified independence Goal: Pt Will Ambulate Outcome: Progressing Flowsheets (Taken 10/12/2021 1013) Pt will Ambulate:  50 feet  with min guard assist  with least restrictive assistive device Goal: Pt/caregiver will Perform Home Exercise Program Outcome: Progressing Flowsheets (Taken 10/12/2021 1013) Pt/caregiver will Perform Home Exercise Program:  For increased strengthening  For improved balance  Independently  10:13 AM, 10/12/21 Mearl Latin PT, DPT Physical Therapist at [facility]

## 2021-10-12 NOTE — Plan of Care (Signed)
°  Problem: Education: Goal: Knowledge of secondary prevention will improve (SELECT ALL) Outcome: Progressing   Problem: Education: Goal: Knowledge of General Education information will improve Description: Including pain rating scale, medication(s)/side effects and non-pharmacologic comfort measures Outcome: Progressing   Problem: Health Behavior/Discharge Planning: Goal: Ability to manage health-related needs will improve Outcome: Progressing   Problem: Clinical Measurements: Goal: Ability to maintain clinical measurements within normal limits will improve Outcome: Progressing Goal: Will remain free from infection Outcome: Progressing Goal: Diagnostic test results will improve Outcome: Progressing Goal: Respiratory complications will improve Outcome: Progressing Goal: Cardiovascular complication will be avoided Outcome: Progressing   Problem: Activity: Goal: Risk for activity intolerance will decrease Outcome: Progressing   Problem: Nutrition: Goal: Adequate nutrition will be maintained Outcome: Progressing   Problem: Coping: Goal: Level of anxiety will decrease Outcome: Progressing   Problem: Elimination: Goal: Will not experience complications related to bowel motility Outcome: Progressing Goal: Will not experience complications related to urinary retention Outcome: Progressing   Problem: Pain Managment: Goal: General experience of comfort will improve Outcome: Progressing   Problem: Safety: Goal: Ability to remain free from injury will improve Outcome: Progressing   Problem: Skin Integrity: Goal: Risk for impaired skin integrity will decrease Outcome: Progressing

## 2021-10-12 NOTE — Evaluation (Signed)
Occupational Therapy Evaluation Patient Details Name: [name] MRN: [id number] DOB: [birth year] Today's Date: 10/12/2021   History of Present Illness [name]  is a 87 y.o. male, with history of B12 deficiency, colon cancer, coronary artery disease, hypertension, GERD, hyperlipidemia, peripheral vascular disease, type 2 diabetes mellitus, and more presents to ED with a chief complaint of right-sided weakness and tremor.  Of note patient has had bilateral upper extremity tremors for at least a year.  He has been following with neurology.  He had been put on propanolol for these tremors, he stopped taking the propanolol 3 days ago.  He reports that no doctor advised him to stop taking the propanolol, he did not because he just had too many pills to take.  Yesterday patient reports that he started having worsening tremors.  Where before the tremors had been symmetric, this time they were right-sided.  It happens intermittently.  The episodes have been becoming more frequent and more intense since the onset yesterday.  He also has dizziness whenever he sits up.  Patient reports that he has not been able to walk because he keeps falling to the right.  The most he was able to do was take a few steps with his walker today.  He felt very unsafe and unsteady in those few steps.  Patient reports that he does have right-sided weakness but only when he has the tremor.  He also has associated cramping pain from his hand up to his neck when the tremor is happening.  He reports all of his symptoms resolved when he lays down, and become worse when he sits up.  He has not been nauseous.  Patient does report 1 week of right-sided flank pain as well.  He was prescribed Keflex for possible UTI.  Urine culture pending.  Patient denies dysuria or hematuria.  He does report decreased urine output from normal.  He has had a normal appetite per his report.   Clinical Impression   Pt agreeable to OT and PT  co-evaluation. Pt presents with R UE weakness and unsteadiness on feet as noted by need for min G to min A for functional transfer to toilet. Pt able to complete peri-care with set up assist. Pt lines with wife but is a high fall risk at this time. Pt was left in chair with call bell within reach. Pt will benefit from continued OT in the hospital and recommended venue below to increase strength, balance, and endurance for safe ADL's.        Recommendations for follow up therapy are one component of a multi-disciplinary discharge planning process, led by the attending physician.  Recommendations may be updated based on patient status, additional functional criteria and insurance authorization.   Follow Up Recommendations  Skilled nursing-short term rehab (<3 hours/day)    Assistance Recommended at Discharge Intermittent Supervision/Assistance  Patient can return home with the following A little help with walking and/or transfers;A little help with bathing/dressing/bathroom;Assistance with cooking/housework;Assist for transportation;Help with stairs or ramp for entrance    Functional Status Assessment  Patient has had a recent decline in their functional status and demonstrates the ability to make significant improvements in function in a reasonable and predictable amount of time.  Equipment Recommendations  None recommended by OT    Recommendations for Other Services       Precautions / Restrictions Precautions Precautions: Fall Restrictions Weight Bearing Restrictions: No      Mobility Bed Mobility Overal bed mobility: Modified Independent  Transfers Overall transfer level: Needs assistance Equipment used: Rolling walker (2 wheels) Transfers: Sit to/from Stand, Bed to chair/wheelchair/BSC Sit to Stand: Min assist     Step pivot transfers: Min assist     General transfer comment: labored transfer to standing with RW      Balance                                            ADL either performed or assessed with clinical judgement   ADL Overall ADL's : Needs assistance/impaired     Grooming: Minimal assistance;Standing;Min guard       Lower Body Bathing: Sitting/lateral leans;Set up       Lower Body Dressing: Set up;Sitting/lateral leans   Toilet Transfer: Minimal assistance;Ambulation;Rolling walker (2 wheels) Toilet Transfer Details (indicate cue type and reason): EOB to toilet Toileting- Clothing Manipulation and Hygiene: Set up;Sitting/lateral lean Toileting - Clothing Manipulation Details (indicate cue type and reason): Pt completed peri-care with s/u assist.     Functional mobility during ADLs: Minimal assistance;Min guard General ADL Comments: Pt unsteady on feet. this is primarily limiting factor for ADL's.     Vision Baseline Vision/History: 1 Wears glasses Ability to See in Adequate Light: 0 Adequate Patient Visual Report: No change from baseline Vision Assessment?: No apparent visual deficits     Perception     Praxis      Pertinent Vitals/Pain Pain Assessment Pain Assessment: Faces Faces Pain Scale: Hurts a little bit Pain Location: R trunk and shoulder Pain Descriptors / Indicators: Sore Pain Intervention(s): Limited activity within patient's tolerance, Monitored during session, Repositioned     Hand Dominance Right   Extremity/Trunk Assessment Upper Extremity Assessment Upper Extremity Assessment: RUE deficits/detail RUE Deficits / Details: 3+/5 grossly. Good fine and gross motor coordinatoin. Noted tremors in R UE. RUE Sensation: decreased light touch RUE Coordination: WNL   Lower Extremity Assessment Lower Extremity Assessment: Defer to PT evaluation RLE Deficits / Details: RLE grossly 4/5 MMT   Cervical / Trunk Assessment Cervical / Trunk Assessment: Normal   Communication Communication Communication: No difficulties   Cognition Arousal/Alertness:  Awake/alert Behavior During Therapy: WFL for tasks assessed/performed Overall Cognitive Status: Within Functional Limits for tasks assessed                                       General Comments       Exercises     Shoulder Instructions      Home Living Family/patient expects to be discharged to:: Private residence Living Arrangements: Spouse/significant other Available Help at Discharge: Family;Available 24 hours/day Type of Home: House Home Access: Stairs to enter [employer] of Steps: 3 Entrance Stairs-Rails: Can reach both Home Layout: Two level Alternate Level Stairs-Number of Steps: 14 Alternate Level Stairs-Rails: Can reach both Bathroom Shower/Tub: Teacher, early years/pre: Standard Bathroom Accessibility: Yes   Home Equipment: Conservation officer, nature (2 wheels);Cane - single point          Prior Functioning/Environment Prior Level of Function : Independent/Modified Independent             Mobility Comments: Hydrographic surveyor, drives, "per patient" ADLs Comments: Independent, "per patient"        OT Problem List: Decreased strength;Decreased activity tolerance;Impaired balance (sitting and/or standing)  OT Treatment/Interventions: Self-care/ADL training;Therapeutic exercise;Therapeutic activities;Patient/family education;Balance training;Neuromuscular education    OT Goals(Current goals can be found in the care plan section) Acute Rehab OT Goals Patient Stated Goal: return home OT Goal Formulation: With patient Time For Goal Achievement: 10/26/21 Potential to Achieve Goals: Good  OT Frequency: Min 2X/week    Co-evaluation PT/OT/SLP Co-Evaluation/Treatment: Yes Reason for Co-Treatment: To address functional/ADL transfers PT goals addressed during session: Mobility/safety with mobility;Balance;Strengthening/ROM OT goals addressed during session: ADL's and self-care      AM-PAC OT "6 Clicks" Daily Activity      Outcome Measure Help from another person eating meals?: None Help from another person taking care of personal grooming?: A Little Help from another person toileting, which includes using toliet, bedpan, or urinal?: A Little Help from another person bathing (including washing, rinsing, drying)?: A Little Help from another person to put on and taking off regular upper body clothing?: None Help from another person to put on and taking off regular lower body clothing?: A Little 6 Click Score: 20   End of Session Equipment Utilized During Treatment: Rolling walker (2 wheels)  Activity Tolerance: Patient tolerated treatment well Patient left: in chair;with call bell/phone within reach  OT Visit Diagnosis: Unsteadiness on feet (R26.81);Other abnormalities of gait and mobility (R26.89);Muscle weakness (generalized) (M62.81);Hemiplegia and hemiparesis Hemiplegia - Right/Left: Right Hemiplegia - dominant/non-dominant: Dominant Hemiplegia - caused by:  (Possible cerebral infarction)                Time: 6269-4854 OT Time Calculation (min): 31 min Charges:  OT General Charges $OT Visit: 1 Visit OT Evaluation $OT Eval Low Complexity: 1 Low  Shah Insley OT, MOT  Larey Seat 10/12/2021, 10:21 AM

## 2021-10-12 NOTE — Progress Notes (Signed)
SLP Cancellation Note  Patient Details Name: [name] MRN: [id number] DOB: [birth year]   Cancelled treatment:       Reason Eval/Treat Not Completed: SLP screened, no needs identified, will sign off; Spoke with nursing/MD and evaluation deferred.  MRI negative for acute processes.   Elvina Sidle, M.S., CCC-SLP 10/12/2021, 3:10 PM

## 2021-10-12 NOTE — TOC Initial Note (Signed)
Transition of Care [facility]) - Initial/Assessment Note    Patient Details  Name: [name] MRN: [id number] Date of Birth: 09-04-1935  [facility]) CM/SW Contact:    Boneta Lucks, RN Phone Number: 10/12/2021, 3:18 PM  Clinical Narrative:  Patient admitted with CVA. Patient lives at home with his wife. PT is recommending SNF. Patient is also COVID positive his only option would be [facility]). Patient states he will go home with home health. [facility]att[facility] Haw with [facility] accepted the referral, start of care will be next week. MD aware to order.          Expected Discharge Plan: [place] Barriers to Discharge: Continued Medical Work up   Patient Goals and CMS Choice Patient states their goals for this hospitalization and ongoing recovery are:: to go home. CMS Medicare.gov Compare Post Acute Care list provided to:: Patient Choice offered to / list presented to : Patient  Expected Discharge Plan and Services Expected Discharge Plan: [place]       Date [place]: 10/12/21 Time [facility] Contacted: [phone] Representative spoke with at [place]: Penni Homans  Prior Living Arrangements/Services   Lives with:: Spouse Patient language and need for interpreter reviewed:: Yes Do you feel safe going back to the place where you live?: Yes      Need for Family Participation in Patient Care: Yes (Comment) Care giver support system in place?: Yes (comment)   Criminal Activity/Legal Involvement Pertinent to Current Situation/Hospitalization: No - Comment as needed  Activities of Daily Living Home Assistive Devices/Equipment: Walker (specify type), Eyeglasses, CBG Meter ADL Screening (condition at time of admission) Patient's cognitive ability adequate to safely complete daily activities?: Yes Is the patient deaf or have difficulty hearing?: No Does the patient have difficulty seeing, even when wearing glasses/contacts?:  No Does the patient have difficulty concentrating, remembering, or making decisions?: Yes Patient able to express need for assistance with ADLs?: Yes Does the patient have difficulty dressing or bathing?: Yes Independently performs ADLs?: No Communication: Independent Dressing (OT): Needs assistance Is this a change from baseline?: Change from baseline, expected to last <3days Grooming: Independent Feeding: Independent Bathing: Needs assistance Is this a change from baseline?: Change from baseline, expected to last <3 days Toileting: Needs assistance Is this a change from baseline?: Change from baseline, expected to last <3 days In/Out Bed: Dependent Is this a change from baseline?: Change from baseline, expected to last <3 days Walks in Home: Dependent Is this a change from baseline?: Change from baseline, expected to last <3 days Does the patient have difficulty walking or climbing stairs?: Yes Weakness of Legs: Both Weakness of Arms/Hands: Right  Permission Sought/Granted    Emotional Assessment     Affect (typically observed): Accepting Orientation: : Oriented to Self, Oriented to Place, Oriented to  Time Alcohol / Substance Use: Not Applicable Psych Involvement: No (comment)  Admission diagnosis:  CVA (cerebral vascular accident) [facility]) [I63.9] Patient Active Problem List   Diagnosis Date Noted   CVA (cerebral vascular accident) ([place]) 10/11/2021   Altered mental status    Chronic renal failure, stage 3b ([place])    Acute metabolic encephalopathy 62/70/3500   Acute pyelonephritis 08/30/2021   Sepsis secondary to UTI ([place]) 08/28/2021   Hyperglycemia 08/28/2021   Obesity (BMI 30.0-34.9) 08/28/2021   Tremors of nervous system 08/28/2021   Insomnia 08/28/2021   Acute lower UTI 08/27/2021   COVID-19 virus infection 06/04/2021   Imbalance 05/06/2021  Hydronephrosis 05/06/2021   Acute respiratory failure with hypoxia ([place]) 05/06/2021   Asthma 05/06/2021   Type 2 diabetes  mellitus with chronic kidney disease, with long-term current use of insulin (HCC)    Proteinuria 03/14/2021   Diabetic peripheral neuropathy ([place]) 03/07/2021   Asymptomatic cholelithiasis 07/06/2019   Peripheral polyneuropathy 01/16/2019   Multiple pulmonary nodules 08/01/2016   Upper airway cough syndrome 07/30/2016   Chronic asthma vs UACS  07/11/2016   Dupuytren's contracture 06/06/2016   Mild cognitive impairment 01/22/2016   Essential tremor 10/17/2015   Acute renal failure superimposed on stage 3b chronic kidney disease ([place]) 07/02/2015   AKI (acute kidney injury) ([place]) 06/30/2015   Acute kidney injury ([place]) 06/30/2015   Status post total right knee replacement 03/29/2014   B12 deficiency 02/07/2014   Diverticulosis of colon with hemorrhage 01/01/2014   Thrombocytopenia, unspecified ([place]) 01/01/2014   Acute blood loss anemia 01/01/2014   Leukopenia 12/31/2013   Lower GI bleed 12/30/2013   Diverticulitis 12/30/2013   Abdominal pain, left lower quadrant 11/16/2013   Abdominal pain, unspecified site 11/16/2013   Mantle cell lymphoma ([place]) 09/07/2013   Prostate cancer ([place]) 09/07/2013   Proctitis, 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   H/O ulcerative colitis 04/12/2013   OSA (obstructive sleep apnea) 08/15/2012   VASOMOTOR RHINITIS 01/23/2010   GERD (gastroesophageal reflux disease) 01/23/2010   SOB (shortness of breath) 01/01/2010   Mixed hyperlipidemia 11/23/2009   Hypertension 11/23/2009   Coronary atherosclerosis of native coronary artery 02/18/2009   PCP:  Celene Squibb, MD Pharmacy:   [facility] [place] [facility] [place] [address] Phone: [phone] Fax: [phone]   Readmission Risk Interventions Readmission Risk Prevention Plan 08/30/2021 06/12/2021 06/12/2021  Transportation Screening Complete Complete -  PCP or Specialist Appt within 3-5 Days Complete - Complete  HRI or Home Care Consult Complete - Complete  Social Work Consult  for Recovery Care Planning/Counseling Complete - Complete  Palliative Care Screening Not Applicable - Not Applicable  Medication Review Press photographer) Complete - Complete  Some recent data might be hidden

## 2021-10-12 NOTE — Progress Notes (Signed)
If PROGRESS NOTE    [name]  MGN:[id number] DOB: 02/07/1935 DOA: 10/11/2021 PCP: Celene Squibb, MD    Brief Narrative:  87 year old male with multiple medical problems, admitted to the hospital with right-sided weakness and tremors.  Initial concern for CVA.  MRI brain negative for acute infarct.  MRI C-spine indicated possible radiculopathy cause for patient's symptoms.  Seen by neurology.  Started on Medrol dose pack.   Assessment & Plan:   Principal Problem:   CVA (cerebral vascular accident) ([place]) Active Problems:   Mixed hyperlipidemia   Hypertension   GERD (gastroesophageal reflux disease)   Acute kidney injury ([place])   Essential tremor   Type 2 diabetes mellitus with chronic kidney disease, with long-term current use of insulin ([facility])   Right-sided weakness -Initially concerns for CVA, but MRI brain negative -Seen by neurology and MRI C-spine ordered -Felt that his weakness likely related more to cervical radiculopathy -He has been started on a Medrol Dosepak -Has been following neurology, Dr. Jaynee Eagles as an outpatient -He is also seen neurosurgery, Dr. Arnoldo Morale in the past -We will need to follow-up with neurology/neurosurgery as an outpatient -Seen by physical therapy with recommendations for skilled nursing facility -Patient does not want to go to skilled nursing facility and has agreed to home health  COVID-positive -No fever or respiratory symptoms -Started on a course of Paxil.  Hypertension -Resume home dose of Norvasc  Hyperlipidemia -Statin on hold for now due to potential interaction with Paxil at  Acute kidney injury -Admission creatinine 1.9 -Baseline creatinine around 1.4 -Started on IV hydration  Diabetes, type II -Continue on basal insulin and sliding scale -Anticipate blood sugar should be higher in light of steroids  Essential tremor -Restarted on propranolol   DVT prophylaxis: Place TED hose Start: 10/12/21 1301 heparin injection  5,000 Units Start: 10/11/21 2215 SCD's Start: 10/11/21 2116  Code Status: Full code Family Communication: Discussed with patient Disposition Plan: Status is: Observation  The patient remains OBS appropriate and will d/c before 2 midnights.        Consultants:  Neurology  Procedures:    Antimicrobials:      Subjective: Denies any shortness of breath, cough.  Says he does still have some right upper extremity weakness.  Objective: Vitals:   10/12/21 0124 10/12/21 0329 10/12/21 0538 10/12/21 0824  BP: 134/86 135/83 (!) 153/96   Pulse: 85 83 (!) 56   Resp: 19 18 16    Temp: (!) 97.3 F (36.3 C) 98 F (36.7 C) 97.9 F (36.6 C)   TempSrc: Oral Oral Oral   SpO2: 93% 92% 94% 95%  Weight:      Height:        Intake/Output Summary (Last 24 hours) at 10/12/2021 2016 Last data filed at 10/12/2021 1700 Gross per 24 hour  Intake 1415.4 ml  Output 1200 ml  Net 215.4 ml   Filed Weights   10/11/21 1829 10/11/21 2335  Weight: 98.9 kg 94.8 kg    Examination:  General exam: Appears calm and comfortable  Respiratory system: Clear to auscultation. Respiratory effort normal. Cardiovascular system: S1 & S2 heard, RRR. No JVD, murmurs, rubs, gallops or clicks. No pedal edema. Gastrointestinal system: Abdomen is nondistended, soft and nontender. No organomegaly or masses felt. Normal bowel sounds heard. Central nervous system: Alert and oriented. No focal neurological deficits. Extremities: Symmetric 5 x 5 power. Skin: No rashes, lesions or ulcers Psychiatry: Judgement and insight appear normal. Mood & affect appropriate.     Data  Reviewed: I have personally reviewed following labs and imaging studies  CBC: Recent Labs  Lab 10/09/21 0308 10/11/21 1900 10/12/21 0436  WBC 7.6 6.3 5.7  NEUTROABS 6.0 4.9  --   HGB 13.4 14.8 13.5  HCT 40.2 44.3 42.2  MCV 94.1 94.3 93.8  PLT 113* 125* 035*   Basic Metabolic Panel: Recent Labs  Lab 10/09/21 0308 10/11/21 1900  10/12/21 0436  NA 135 134* 136  K 4.0 4.7 4.6  CL 104 98 99  CO2 22 24 26   GLUCOSE 341* 272* 294*  BUN 31* 23 23  CREATININE 1.79* 1.93* 1.74*  CALCIUM 8.5* 9.0 9.1  MG  --  1.4*  --    GFR: Estimated Creatinine Clearance: 37.1 mL/min (A) (by C-G formula based on SCr of 1.74 mg/dL (H)). Liver Function Tests: Recent Labs  Lab 10/09/21 0308 10/11/21 1900 10/12/21 0436  AST 20 21 13*  ALT 15 18 13   ALKPHOS 68 61 56  BILITOT 0.7 0.9 0.6  PROT 6.5 6.9 6.0*  ALBUMIN 4.1 4.2 3.7   No results for input(s): LIPASE, AMYLASE in the last 168 hours. No results for input(s): AMMONIA in the last 168 hours. Coagulation Profile: Recent Labs  Lab 10/11/21 1900  INR 1.0   Cardiac Enzymes: No results for input(s): CKTOTAL, CKMB, CKMBINDEX, TROPONINI in the last 168 hours. BNP (last 3 results) No results for input(s): PROBNP in the last 8760 hours. HbA1C: No results for input(s): HGBA1C in the last 72 hours. CBG: Recent Labs  Lab 10/11/21 2152 10/12/21 0716 10/12/21 1159 10/12/21 1607  GLUCAP 237* 178* 160* 129*   Lipid Profile: Recent Labs    10/12/21 0436  CHOL 123  HDL 37*  LDLCALC 65  TRIG 103  CHOLHDL 3.3   Thyroid Function Tests: No results for input(s): TSH, T4TOTAL, FREET4, T3FREE, THYROIDAB in the last 72 hours. Anemia Panel: No results for input(s): VITAMINB12, FOLATE, FERRITIN, TIBC, IRON, RETICCTPCT in the last 72 hours. Sepsis Labs: No results for input(s): PROCALCITON, LATICACIDVEN in the last 168 hours.  Recent Results (from the past 240 hour(s))  Urine Culture     Status: Abnormal   Collection Time: 10/09/21  3:11 AM   Specimen: Urine, Clean Catch  Result Value Ref Range Status   Specimen Description   Final    URINE, CLEAN CATCH Performed at [facility], [address], [place], [place] [address]    Special Requests   Final    NONE Performed at [facility], [address] [place], [place] [address]    Culture (A)  Final    30,000  COLONIES/mL PSEUDOMONAS AERUGINOSA 30,000 COLONIES/mL ESCHERICHIA COLI    Report Status 10/12/2021 FINAL  Final   Organism ID, Bacteria PSEUDOMONAS AERUGINOSA (A)  Final   Organism ID, Bacteria ESCHERICHIA COLI (A)  Final      Susceptibility   Escherichia coli - MIC*    AMPICILLIN >=32 RESISTANT Resistant     CEFAZOLIN <=4 SENSITIVE Sensitive     CEFEPIME <=0.12 SENSITIVE Sensitive     CEFTRIAXONE <=0.25 SENSITIVE Sensitive     CIPROFLOXACIN <=0.25 SENSITIVE Sensitive     GENTAMICIN <=1 SENSITIVE Sensitive     IMIPENEM <=0.25 SENSITIVE Sensitive     NITROFURANTOIN <=16 SENSITIVE Sensitive     TRIMETH/SULFA <=20 SENSITIVE Sensitive     AMPICILLIN/SULBACTAM 16 INTERMEDIATE Intermediate     PIP/TAZO <=4 SENSITIVE Sensitive     * 30,000 COLONIES/mL ESCHERICHIA COLI   Pseudomonas aeruginosa - MIC*  CEFTAZIDIME 8 SENSITIVE Sensitive     CIPROFLOXACIN <=0.25 SENSITIVE Sensitive     GENTAMICIN <=1 SENSITIVE Sensitive     IMIPENEM 1 SENSITIVE Sensitive     * 30,000 COLONIES/mL PSEUDOMONAS AERUGINOSA  Resp Panel by RT-PCR (Flu A&B, Covid) Nasopharyngeal Swab     Status: Abnormal   Collection Time: 10/11/21  7:12 PM   Specimen: Nasopharyngeal Swab; Nasopharyngeal(NP) swabs in vial transport medium  Result Value Ref Range Status   SARS Coronavirus 2 by RT PCR POSITIVE (A) NEGATIVE Final    Comment: (NOTE) SARS-CoV-2 target nucleic acids are DETECTED.  The SARS-CoV-2 RNA is generally detectable in upper respiratory specimens during the acute phase of infection. Positive results are indicative of the presence of the identified virus, but do not rule out bacterial infection or co-infection with other pathogens not detected by the test. Clinical correlation with patient history and other diagnostic information is necessary to determine patient infection status. The expected result is Negative.  Fact Sheet for Patients: EntrepreneurPulse.com.au  Fact Sheet for  Healthcare Providers: IncredibleEmployment.be  This test is not yet approved or cleared by the [place] FDA and  has been authorized for detection and/or diagnosis of SARS-CoV-2 by FDA under an Emergency Use Authorization (EUA).  This EUA will remain in effect (meaning this test can be used) for the duration of  the COVID-19 declaration under Section 564(b)(1) of the A ct, 21 U.S.C. section 360bbb-3(b)(1), unless the authorization is terminated or revoked sooner.     Influenza A by PCR NEGATIVE NEGATIVE Final   Influenza B by PCR NEGATIVE NEGATIVE Final    Comment: (NOTE) The Xpert Xpress SARS-CoV-2/FLU/RSV plus assay is intended as an aid in the diagnosis of influenza from Nasopharyngeal swab specimens and should not be used as a sole basis for treatment. Nasal washings and aspirates are unacceptable for Xpert Xpress SARS-CoV-2/FLU/RSV testing.  Fact Sheet for Patients: EntrepreneurPulse.com.au  Fact Sheet for Healthcare Providers: IncredibleEmployment.be  This test is not yet approved or cleared by the [place] FDA and has been authorized for detection and/or diagnosis of SARS-CoV-2 by FDA under an Emergency Use Authorization (EUA). This EUA will remain in effect (meaning this test can be used) for the duration of the COVID-19 declaration under Section 564(b)(1) of the Act, 21 U.S.C. section 360bbb-3(b)(1), unless the authorization is terminated or revoked.  Performed at [facility], [address] [place], [place] [address]          Radiology Studies: DG Shoulder Right  Result Date: 10/12/2021 CLINICAL DATA:  Fall, right shoulder pain, decreased range of motion EXAM: RIGHT SHOULDER - 2+ VIEW COMPARISON:  None. FINDINGS: There is no evidence of acute fracture or dislocation. Glenohumeral and acromioclavicular alignment is maintained. There is moderate degenerative change about the [facility] joint. Scattered foci  of calcification about the greater tuberosity may reflect calcific tendinitis. IMPRESSION: 1. No acute fracture or dislocation. 2. Calcifications about the greater tuberosity may reflect calcific tendinitis. 3. Moderate degenerative changes about the [facility] joint. Electronically Signed   By: Valetta Mole M.D.   On: 10/12/2021 11:50   CT Head Wo Contrast  Result Date: 10/11/2021 CLINICAL DATA:  Neuro deficit, acute, stroke suspected R sided weakness EXAM: CT HEAD WITHOUT CONTRAST TECHNIQUE: Contiguous axial images were obtained from the base of the skull through the vertex without intravenous contrast. RADIATION DOSE REDUCTION: This exam was performed according to the departmental dose-optimization program which includes automated exposure control, adjustment of the mA and/or kV according to patient  size and/or use of iterative reconstruction technique. COMPARISON:  None. BRAIN: BRAIN Cerebral ventricle sizes are concordant with the degree of cerebral volume loss. Patchy and confluent areas of decreased attenuation are noted throughout the deep and periventricular white matter of the cerebral hemispheres bilaterally, compatible with chronic microvascular ischemic disease. No evidence of large-territorial acute infarction. No parenchymal hemorrhage. No mass lesion. No extra-axial collection. No mass effect or midline shift. No hydrocephalus. Basilar cisterns are patent. Vascular: No hyperdense vessel. Skull: No acute fracture or focal lesion. Sinuses/Orbits: Paranasal sinuses and mastoid air cells are clear. Bilateral lens replacement. Otherwise the orbits are unremarkable. Other: None. IMPRESSION: No acute intracranial abnormality. Electronically Signed   By: Iven Finn M.D.   On: 10/11/2021 19:47   MR ANGIO HEAD WO CONTRAST  Result Date: 10/12/2021 CLINICAL DATA:  Stroke/TIA, determine embolic source; Neuro deficit, acute, stroke suspected; right-sided weakness EXAM: MRI HEAD WITHOUT CONTRAST MRA HEAD  WITHOUT CONTRAST TECHNIQUE: Multiplanar, multi-echo pulse sequences of the brain and surrounding structures were acquired without intravenous contrast. Angiographic images of the Circle of Willis were acquired using MRA technique without intravenous contrast. COMPARISON:  MR brain 2017 FINDINGS: MRI HEAD Brain: There is no acute infarction or intracranial hemorrhage. There is no intracranial mass, mass effect, or edema. There is no hydrocephalus or extra-axial fluid collection. Prominence of the ventricles and sulci reflects parenchymal volume loss. Patchy and confluent T2 hyperintensity in the supratentorial and pontine white matter is nonspecific but probably reflects mild to moderate chronic microvascular ischemic changes. Vascular: Major vessel flow voids at the skull base are preserved. Skull and upper cervical spine: Normal marrow signal is preserved. Sinuses/Orbits: Paranasal sinuses are aerated. Bilateral lens replacements. Other: Sella is unremarkable.  Mastoid air cells are clear. MRA HEAD Intracranial internal carotid arteries are patent. Middle and anterior cerebral arteries are patent. Intracranial vertebral arteries, basilar artery, posterior cerebral arteries are patent. Right posterior communicating artery is present with infundibular origin there is no significant stenosis or aneurysm. IMPRESSION: No evidence of recent infarction, hemorrhage, or mass. Mild to moderate chronic microvascular ischemic changes. No proximal intracranial vessel occlusion. Electronically Signed   By: Macy Mis M.D.   On: 10/12/2021 11:11   MR BRAIN WO CONTRAST  Result Date: 10/12/2021 CLINICAL DATA:  Stroke/TIA, determine embolic source; Neuro deficit, acute, stroke suspected; right-sided weakness EXAM: MRI HEAD WITHOUT CONTRAST MRA HEAD WITHOUT CONTRAST TECHNIQUE: Multiplanar, multi-echo pulse sequences of the brain and surrounding structures were acquired without intravenous contrast. Angiographic images of the  Circle of Willis were acquired using MRA technique without intravenous contrast. COMPARISON:  MR brain 2017 FINDINGS: MRI HEAD Brain: There is no acute infarction or intracranial hemorrhage. There is no intracranial mass, mass effect, or edema. There is no hydrocephalus or extra-axial fluid collection. Prominence of the ventricles and sulci reflects parenchymal volume loss. Patchy and confluent T2 hyperintensity in the supratentorial and pontine white matter is nonspecific but probably reflects mild to moderate chronic microvascular ischemic changes. Vascular: Major vessel flow voids at the skull base are preserved. Skull and upper cervical spine: Normal marrow signal is preserved. Sinuses/Orbits: Paranasal sinuses are aerated. Bilateral lens replacements. Other: Sella is unremarkable.  Mastoid air cells are clear. MRA HEAD Intracranial internal carotid arteries are patent. Middle and anterior cerebral arteries are patent. Intracranial vertebral arteries, basilar artery, posterior cerebral arteries are patent. Right posterior communicating artery is present with infundibular origin there is no significant stenosis or aneurysm. IMPRESSION: No evidence of recent infarction, hemorrhage, or mass. Mild to  moderate chronic microvascular ischemic changes. No proximal intracranial vessel occlusion. Electronically Signed   By: Macy Mis M.D.   On: 10/12/2021 11:11   MR CERVICAL SPINE WO CONTRAST  Result Date: 10/12/2021 CLINICAL DATA:  Cervical radiculopathy, no red flags. EXAM: MRI CERVICAL SPINE WITHOUT CONTRAST TECHNIQUE: Multiplanar, multisequence MR imaging of the cervical spine was performed. No intravenous contrast was administered. COMPARISON:  CT myelogram September 23, 2007. FINDINGS: The study is degraded by motion. Alignment: Small anterolisthesis at C2-3, C3-4 and C7-T1. Vertebrae: No fracture, evidence of discitis, or bone lesion. Postsurgical changes from anterior fusion at C5-C7. Cord: Normal signal  and morphology. Posterior Fossa, vertebral arteries, paraspinal tissues: T2 hyperintensity within the pons, nonspecific, most likely related to chronic small vessel disease, better seen on recent MRI of the brain. Left atlantooccipital joint effusion. Disc levels: C2-3: Posterior disc protrusion and thickening of the ligamentum flavum resulting in mild spinal canal stenosis. Uncovertebral and facet degenerative changes resulting mild bilateral neural foraminal narrowing. C3-4: Posterior disc osteophyte complex resulting in mild spinal canal stenosis. Uncovertebral and facet degenerative changes resulting in severe right and moderate left neural foraminal narrowing. C4-5: Posterior disc osteophyte complex without significant spinal canal stenosis. Uncovertebral and facet degenerative changes resulting in moderate bilateral neural foraminal narrowing. C5-6: Uncovertebral and facet degenerative changes resulting in moderate right and mild left neural foraminal narrowing. No spinal canal stenosis. C6-7: No spinal canal stenosis. Uncovertebral and facet degenerative changes resulting moderate right and severe left neural foraminal narrowing. C7-T1: Disc bulge/disc uncovering without significant spinal canal stenosis. Hypertrophic facet degenerative changes without significant neural foraminal narrowing. IMPRESSION: 1. Status post C5-C7 fusion. 2. Degenerative changes of the cervical spine with multilevel neural foraminal narrowing, as described above. 3. Mild spinal canal stenosis at C5-2 3 and C3-4. Electronically Signed   By: Pedro Earls M.D.   On: 10/12/2021 12:17   US Carotid Bilateral (at [facility] and [facility] only)  Result Date: 10/12/2021 CLINICAL DATA:  Stroke, hyperlipidemia, diabetes EXAM: BILATERAL CAROTID DUPLEX ULTRASOUND TECHNIQUE: Pearline Cables scale imaging, color Doppler and duplex ultrasound were performed of bilateral carotid and vertebral arteries in the neck. COMPARISON:  None. FINDINGS: Criteria:  Quantification of carotid stenosis is based on velocity parameters that correlate the residual internal carotid diameter with NASCET-based stenosis levels, using the diameter of the distal internal carotid lumen as the denominator for stenosis measurement. The following velocity measurements were obtained: RIGHT ICA: 60/10 cm/sec CCA: 03/4 cm/sec SYSTOLIC ICA/CCA RATIO:  1.0 ECA: 79 cm/sec LEFT ICA: 108/29 cm/sec CCA: 74/25 cm/sec SYSTOLIC ICA/CCA RATIO:  1.6 ECA: 87 cm/sec RIGHT CAROTID ARTERY: Mild tortuosity. Mild intimal thickening in the common carotid artery and bulb. Eccentric partially calcified plaque in the proximal ICA resulting in only mild stenosis. Normal waveforms and color Doppler signal throughout. RIGHT VERTEBRAL ARTERY:  Normal flow direction and waveform. LEFT CAROTID ARTERY: Intimal thickening in the common carotid artery. Mild eccentric noncalcified smooth nonocclusive plaque in the bulb and ECA origin. Normal waveforms and color Doppler signal throughout. LEFT VERTEBRAL ARTERY:  Normal flow direction and waveform. IMPRESSION: 1. Bilateral carotid bifurcation plaque resulting in less than 50% diameter ICA stenosis. 2. Antegrade bilateral vertebral arterial flow. Electronically Signed   By: Lucrezia Europe M.D.   On: 10/12/2021 13:57   ECHOCARDIOGRAM COMPLETE  Result Date: 10/12/2021    ECHOCARDIOGRAM REPORT   Patient Name:   [name] Date of Exam: 10/12/2021 Medical Rec #:  [id number]         Height:  71.0 in Accession #:    [id number]        Weight:       209.0 lb Date of Birth:  Oct 28, 1934        BSA:          2.148 m Patient Age:    15 years          BP:           153/96 mmHg Patient Gender: M                 HR:           56 bpm. Exam Location:  [facility] Procedure: 2D Echo, Cardiac Doppler and Color Doppler Indications:    Stroke  History:        Patient has prior history of Echocardiogram examinations, most                 recent 01/11/2010. CAD, Stroke,  Signs/Symptoms:Shortness of                 Breath; Risk Factors:Hypertension, Diabetes and Dyslipidemia.  Sonographer:    Wenda Low Referring Phys: [id number] ASIA B [place]  1. Left ventricular ejection fraction, by estimation, is 50 to 55%. Left ventricular ejection fraction by 2D MOD biplane is 53.1 %. The left ventricle has low normal function. The left ventricle has no regional wall motion abnormalities. There is moderate concentric left ventricular hypertrophy. Left ventricular diastolic parameters are consistent with Grade I diastolic dysfunction (impaired relaxation).  2. Right ventricular systolic function is normal. The right ventricular size is normal. Tricuspid regurgitation signal is inadequate for assessing PA pressure.  3. The mitral valve is grossly normal. Trivial mitral valve regurgitation. No evidence of mitral stenosis.  4. The aortic valve is tricuspid. Aortic valve regurgitation is mild. No aortic stenosis is present.  5. The inferior vena cava is normal in size with greater than 50% respiratory variability, suggesting right atrial pressure of 3 mmHg. Conclusion(s)/Recommendation(s): No intracardiac source of embolism detected on this transthoracic study. Consider a transesophageal echocardiogram to exclude cardiac source of embolism if clinically indicated. FINDINGS  Left Ventricle: Left ventricular ejection fraction, by estimation, is 50 to 55%. Left ventricular ejection fraction by 2D MOD biplane is 53.1 %. The left ventricle has low normal function. The left ventricle has no regional wall motion abnormalities. The left ventricular internal cavity size was normal in size. There is moderate concentric left ventricular hypertrophy. Left ventricular diastolic parameters are consistent with Grade I diastolic dysfunction (impaired relaxation). Right Ventricle: The right ventricular size is normal. No increase in right ventricular wall thickness. Right ventricular systolic  function is normal. Tricuspid regurgitation signal is inadequate for assessing PA pressure. Left Atrium: Left atrial size was normal in size. Right Atrium: Right atrial size was normal in size. Pericardium: There is no evidence of pericardial effusion. Mitral Valve: The mitral valve is grossly normal. Trivial mitral valve regurgitation. No evidence of mitral valve stenosis. MV peak gradient, 3.0 mmHg. The mean mitral valve gradient is 1.0 mmHg. Tricuspid Valve: The tricuspid valve is grossly normal. Tricuspid valve regurgitation is trivial. No evidence of tricuspid stenosis. Aortic Valve: The aortic valve is tricuspid. Aortic valve regurgitation is mild. No aortic stenosis is present. Aortic valve mean gradient measures 2.0 mmHg. Aortic valve peak gradient measures 2.7 mmHg. Aortic valve area, by VTI measures 3.50 cm. Pulmonic Valve: The pulmonic valve was grossly normal. Pulmonic valve regurgitation is not visualized. No evidence of pulmonic  stenosis. Aorta: The aortic root and ascending aorta are structurally normal, with no evidence of dilitation. Venous: The inferior vena cava is normal in size with greater than 50% respiratory variability, suggesting right atrial pressure of 3 mmHg. IAS/Shunts: The atrial septum is grossly normal.  LEFT VENTRICLE PLAX 2D                        Biplane EF (MOD) LVIDd:         4.70 cm         LV Biplane EF:   Left LVIDs:         3.10 cm                          ventricular LV PW:         1.40 cm                          ejection LV IVS:        1.40 cm                          fraction by LVOT diam:     2.20 cm                          2D MOD LV SV:         71                               biplane is LV SV Index:   33                               53.1 %. LVOT Area:     3.80 cm                                Diastology                                LV e' medial:    5.00 cm/s LV Volumes (MOD)               LV E/e' medial:  12.7 LV vol d, MOD    48.6 ml       LV e' lateral:    12.00 cm/s A2C:                           LV E/e' lateral: 5.3 LV vol d, MOD    47.3 ml A4C: LV vol s, MOD    22.7 ml A2C: LV vol s, MOD    22.6 ml A4C: LV SV MOD A2C:   25.9 ml LV SV MOD A4C:   47.3 ml LV SV MOD BP:    25.7 ml RIGHT VENTRICLE RV Basal diam:  3.50 cm RV Mid diam:    2.50 cm RV S prime:     12.20 cm/s TAPSE (M-mode): 2.3 cm LEFT ATRIUM             Index        RIGHT ATRIUM  Index LA diam:        3.80 cm 1.77 cm/m   RA Area:     12.40 cm LA Vol (A2C):   45.0 ml 20.95 ml/m  RA Volume:   22.40 ml  10.43 ml/m LA Vol (A4C):   40.5 ml 18.85 ml/m LA Biplane Vol: 46.1 ml 21.46 ml/m  AORTIC VALVE                    PULMONIC VALVE AV Area (Vmax):    3.77 cm     PV Vmax:       0.78 m/s AV Area (Vmean):   3.25 cm     PV Peak grad:  2.5 mmHg AV Area (VTI):     3.50 cm AV Vmax:           82.30 cm/s AV Vmean:          60.400 cm/s AV VTI:            0.204 m AV Peak Grad:      2.7 mmHg AV Mean Grad:      2.0 mmHg LVOT Vmax:         81.60 cm/s LVOT Vmean:        51.600 cm/s LVOT VTI:          0.188 m LVOT/AV VTI ratio: 0.92  AORTA Ao Root diam: 3.30 cm Ao Asc diam:  3.30 cm MITRAL VALVE MV Area (PHT): 2.87 cm    SHUNTS MV Area VTI:   2.46 cm    Systemic VTI:  0.19 m MV Peak grad:  3.0 mmHg    Systemic Diam: 2.20 cm MV Mean grad:  1.0 mmHg MV Vmax:       0.87 m/s MV Vmean:      41.9 cm/s MV Decel Time: 264 msec MV E velocity: 63.40 cm/s MV A velocity: 81.40 cm/s MV E/A ratio:  0.78 Eleonore Chiquito MD Electronically signed by Eleonore Chiquito MD Signature Date/Time: 10/12/2021/3:26:15 PM    Final         Scheduled Meds:  cephALEXin  500 mg Oral TID   gabapentin  300 mg Oral Daily   And   gabapentin  600 mg Oral QHS   heparin  5,000 Units Subcutaneous Q8H   insulin aspart  0-15 Units Subcutaneous TID WC   insulin aspart  0-5 Units Subcutaneous QHS   insulin detemir  15 Units Subcutaneous QHS   ipratropium  2 spray Each Nare BID   LORazepam  0.5 mg Oral QHS   [START ON 10/13/2021]  methylPREDNISolone  4 mg Oral 3 x daily with food   [START ON 10/14/2021] methylPREDNISolone  4 mg Oral 4X daily taper   methylPREDNISolone  8 mg Oral Nightly   [START ON 10/13/2021] methylPREDNISolone  8 mg Oral Nightly   mometasone-formoterol  2 puff Inhalation BID   nirmatrelvir/ritonavir EUA (renal dosing)  2 tablet Oral BID   propranolol ER  60 mg Oral QHS   Continuous Infusions:  sodium chloride 75 mL/hr at 10/11/21 2333     LOS: 0 days    Time spent: 35 minutes    Kathie Dike, MD [facility]   If 7PM-7AM, please contact night-coverage [email or web address]  10/12/2021, 8:16 PM

## 2021-10-12 NOTE — Progress Notes (Addendum)
[facility] Telemedicine Consult   Requesting Provider: Dr. Roderic Palau  Chief Complaint: right arm tremor, right sided weakness and dizziness  HPI:  87 year old male with history of hypertension, hyperlipidemia, diabetes, PVD, CAD, B12 deficiency and mantle cell lymphoma of the colon in remission and prostate cancer s/p surgery and radiation admitted for neck pain x 6 months, shoulder pain for 2 months, getting worse with right arm/hand cramping x 3 weeks, dizziness on getting up from bed for 3 weeks.  Patient stated that he had neck surgery many years ago (per chart seems to be in 2000 with Dr. Arnoldo Morale NSG) with several vertebrae fusion.  Since then, he had a normal problem until 6 months ago he started to have neck pain intermittently and for the last 2 months he also started to have right shoulder pain.  For the last 3 weeks, he had intermittent right shoulder pain, right trunk and right arm pain with right hand cramping, in locking up position, need massage of hand to ease off the cramping.  It could happen when he is tossing and turning in bed or getting up from bed.  He also complaining of feeling dizziness, some spinning sensation well sitting up from bed or standing up from bed.  Sometimes he has to put himself back down to bed to help ease off dizziness.  He had a fall 3 weeks ago.  Had MRI brain and MRA head unremarkable. Carotid Doppler pending.  2D echo pending.  LDL 65, A1c pending.  UDS negative.  Patient has been following with neurology and [facility] for tremor, taking propranolol at home.  For the last 3 days, he was taking a new medication for diabetes and antibiotics, which made him feeling drunk.  He did not take his other medication for the last 3 days.  He felt slight worsening of his tremor but nothing to do with his cramping. He takes Lipitor 40 at home.  Exam: Vitals:   10/12/21 0538 10/12/21 0824  BP: (!) 153/96   Pulse: (!) 56   Resp: 16   Temp: 97.9 F (36.6 C)    SpO2: 94% 95%     Temp:  [97.3 F (36.3 C)-98.2 F (36.8 C)] 97.9 F (36.6 C) (01/20 0538) Pulse Rate:  [56-99] 56 (01/20 0538) Resp:  [16-21] 16 (01/20 0538) BP: (134-157)/(75-96) 153/96 (01/20 0538) SpO2:  [92 %-95 %] 95 % (01/20 0824) Weight:  [94.8 kg-98.9 kg] 94.8 kg (01/19 2335)  General - Well nourished, well developed, in no apparent distress.  Ophthalmologic - fundi not visualized due to noncooperation.  Cardiovascular - Regular rhythm and rate.  Neuro - awake, alert, eyes open, orientated to age, place, time. No aphasia, fluent language, following all simple commands. Able to name and repeat. No gaze palsy, tracking bilaterally, visual field full, PERRL. No facial droop. Tongue midline. Bilateral UEs 5/5, no drift. Bilaterally LEs 5/5, no drift. Sensation symmetrical bilaterally, b/l FTN intact, gait not tested. I witnessed one episode of his right little finger cramping after he sat up at the edge of bed with right shoulder pain, lasted about 3-4 min. After rubbing the digit, it was resolved.  Imaging Reviewed:  CT Head Wo Contrast  Result Date: 10/11/2021 CLINICAL DATA:  Neuro deficit, acute, stroke suspected R sided weakness EXAM: CT HEAD WITHOUT CONTRAST TECHNIQUE: Contiguous axial images were obtained from the base of the skull through the vertex without intravenous contrast. RADIATION DOSE REDUCTION: This exam was performed according to the departmental dose-optimization program which includes  automated exposure control, adjustment of the mA and/or kV according to patient size and/or use of iterative reconstruction technique. COMPARISON:  None. BRAIN: BRAIN Cerebral ventricle sizes are concordant with the degree of cerebral volume loss. Patchy and confluent areas of decreased attenuation are noted throughout the deep and periventricular white matter of the cerebral hemispheres bilaterally, compatible with chronic microvascular ischemic disease. No evidence of  large-territorial acute infarction. No parenchymal hemorrhage. No mass lesion. No extra-axial collection. No mass effect or midline shift. No hydrocephalus. Basilar cisterns are patent. Vascular: No hyperdense vessel. Skull: No acute fracture or focal lesion. Sinuses/Orbits: Paranasal sinuses and mastoid air cells are clear. Bilateral lens replacement. Otherwise the orbits are unremarkable. Other: None. IMPRESSION: No acute intracranial abnormality. Electronically Signed   By: Iven Finn M.D.   On: 10/11/2021 19:47   MR ANGIO HEAD WO CONTRAST  Result Date: 10/12/2021 CLINICAL DATA:  Stroke/TIA, determine embolic source; Neuro deficit, acute, stroke suspected; right-sided weakness EXAM: MRI HEAD WITHOUT CONTRAST MRA HEAD WITHOUT CONTRAST TECHNIQUE: Multiplanar, multi-echo pulse sequences of the brain and surrounding structures were acquired without intravenous contrast. Angiographic images of the Circle of Willis were acquired using MRA technique without intravenous contrast. COMPARISON:  MR brain 2017 FINDINGS: MRI HEAD Brain: There is no acute infarction or intracranial hemorrhage. There is no intracranial mass, mass effect, or edema. There is no hydrocephalus or extra-axial fluid collection. Prominence of the ventricles and sulci reflects parenchymal volume loss. Patchy and confluent T2 hyperintensity in the supratentorial and pontine white matter is nonspecific but probably reflects mild to moderate chronic microvascular ischemic changes. Vascular: Major vessel flow voids at the skull base are preserved. Skull and upper cervical spine: Normal marrow signal is preserved. Sinuses/Orbits: Paranasal sinuses are aerated. Bilateral lens replacements. Other: Sella is unremarkable.  Mastoid air cells are clear. MRA HEAD Intracranial internal carotid arteries are patent. Middle and anterior cerebral arteries are patent. Intracranial vertebral arteries, basilar artery, posterior cerebral arteries are patent. Right  posterior communicating artery is present with infundibular origin there is no significant stenosis or aneurysm. IMPRESSION: No evidence of recent infarction, hemorrhage, or mass. Mild to moderate chronic microvascular ischemic changes. No proximal intracranial vessel occlusion. Electronically Signed   By: Macy Mis M.D.   On: 10/12/2021 11:11   MR BRAIN WO CONTRAST  Result Date: 10/12/2021 CLINICAL DATA:  Stroke/TIA, determine embolic source; Neuro deficit, acute, stroke suspected; right-sided weakness EXAM: MRI HEAD WITHOUT CONTRAST MRA HEAD WITHOUT CONTRAST TECHNIQUE: Multiplanar, multi-echo pulse sequences of the brain and surrounding structures were acquired without intravenous contrast. Angiographic images of the Circle of Willis were acquired using MRA technique without intravenous contrast. COMPARISON:  MR brain 2017 FINDINGS: MRI HEAD Brain: There is no acute infarction or intracranial hemorrhage. There is no intracranial mass, mass effect, or edema. There is no hydrocephalus or extra-axial fluid collection. Prominence of the ventricles and sulci reflects parenchymal volume loss. Patchy and confluent T2 hyperintensity in the supratentorial and pontine white matter is nonspecific but probably reflects mild to moderate chronic microvascular ischemic changes. Vascular: Major vessel flow voids at the skull base are preserved. Skull and upper cervical spine: Normal marrow signal is preserved. Sinuses/Orbits: Paranasal sinuses are aerated. Bilateral lens replacements. Other: Sella is unremarkable.  Mastoid air cells are clear. MRA HEAD Intracranial internal carotid arteries are patent. Middle and anterior cerebral arteries are patent. Intracranial vertebral arteries, basilar artery, posterior cerebral arteries are patent. Right posterior communicating artery is present with infundibular origin there is no significant stenosis or  aneurysm. IMPRESSION: No evidence of recent infarction, hemorrhage, or mass.  Mild to moderate chronic microvascular ischemic changes. No proximal intracranial vessel occlusion. Electronically Signed   By: Macy Mis M.D.   On: 10/12/2021 11:11     Labs reviewed in epic and pertinent values follow: LDL 65, A1c pending.  UDS negative.  Assessment:  87 year old male with history of hypertension, hyperlipidemia, diabetes, PVD, CAD, B12 deficiency and mantle cell lymphoma of the colon in remission and prostate cancer s/p surgery and radiation admitted for neck pain x 6 months, shoulder pain for 2 months, getting worse with right arm/hand cramping x 3 weeks, dizziness on getting up from bed for 3 weeks. MRI brain and MRA head unremarkable. Carotid Doppler pending.  2D echo pending.  LDL 65, A1c pending.  UDS negative.  Pt neck pain, right shoulder pain, right arm/hand cramping and hx of neck surgery more concerning for cervical radiculopathy, will do MRI C-spine for evaluation.  Patient dizziness after sitting up or standing up, resolved after lying down, concerning for orthostatic hypotension.  Will check orthostatic vitals.  Continue home medication.  Continue follow-up with Dr. Jaynee Eagles at [facility].  He has appointment on 11/20/2021 with Dr Jaynee Eagles. Also recommend follow-up with Dr. Arnoldo Morale NSG since he had a seizure with Dr. Arnoldo Morale back in 2000.     Recommendations:  MRI C-spine Orthostatic vitals Avoid low BP, encourage p.o. intake Follow-up on 2D echo, carotid Doppler and A1c. Continue home medication Continue follow-up with Dr. Jaynee Eagles at [facility].  He has appointment on 11/20/2021 with Dr Jaynee Eagles. May consider EMG/NCS as outpt.  Also recommend follow-up with Dr. Arnoldo Morale NSG since he had a seizure with Dr. Arnoldo Morale back in 2000.     Consult Participants: Patient, RN and me Location of the provider: Home Location of the patient: [facility]  This consult was provided via telemedicine with 2-way video and audio communication. The patient/family was informed that care would be provided in  this way and agreed to receive care in this manner.   This patient is receiving care for possible acute neurological changes. There was 60 minutes of care by this provider at the time of service, including time for direct evaluation via telemedicine, review of medical records, imaging studies and discussion of findings with providers, the patient and/or family.  Rosalin Hawking, MD PhD Stroke Neurology 10/12/2021 9:40 AM  ADDENDUM: MRI C-spine reviewed and agree with radiology that pt had moderate right foraminal narrowing at C5-6 and C6-7. Also had severe left neuroal foraminal narrowing at C6-7. I think it fits to pt presenting symptoms. He needs to followup with Dr. Arnoldo Morale NSG as outpt. I recommend PT/OT for cervical radiculopathy and also put on flexiril PRN. Also follow up with Dr. Jaynee Eagles at [facility] on 11/20/21 to consider EMG/NCS.   His orthostatic vital showed mild orthostatic hypotension, I put on TED hose. Encourage po intake, BP monitoring at home and PCP follow up.   Rosalin Hawking, MD PhD Stroke Neurology 10/12/2021 1:12 PM

## 2021-10-12 NOTE — Progress Notes (Signed)
Pt transported to MRI 

## 2021-10-12 NOTE — Plan of Care (Signed)
°  Problem: Acute Rehab OT Goals (only OT should resolve) Goal: Pt. Will Perform Grooming Flowsheets (Taken 10/12/2021 1023) Pt Will Perform Grooming:  with modified independence  standing Goal: Pt. Will Perform Upper Body Dressing Flowsheets (Taken 10/12/2021 1023) Pt Will Perform Upper Body Dressing:  sitting  with modified independence Goal: Pt. Will Perform Lower Body Dressing Flowsheets (Taken 10/12/2021 1023) Pt Will Perform Lower Body Dressing:  with modified independence  sit to/from stand  sitting/lateral leans  with adaptive equipment Goal: Pt. Will Transfer To Toilet Flowsheets (Taken 10/12/2021 1023) Pt Will Transfer to Toilet:  with modified independence  ambulating Goal: Pt/Caregiver Will Perform Home Exercise Program Flowsheets (Taken 10/12/2021 1023) Pt/caregiver will Perform Home Exercise Program:  Increased strength  Right Upper extremity  Independently  Laisa Larrick OT, MOT

## 2021-10-12 NOTE — Progress Notes (Signed)
*  PRELIMINARY RESULTS* Echocardiogram 2D Echocardiogram has been performed.  Elpidio Anis 10/12/2021, 3:14 PM

## 2021-10-13 DIAGNOSIS — K219 Gastro-esophageal reflux disease without esophagitis: Secondary | ICD-10-CM | POA: Diagnosis not present

## 2021-10-13 DIAGNOSIS — N179 Acute kidney failure, unspecified: Secondary | ICD-10-CM | POA: Diagnosis not present

## 2021-10-13 DIAGNOSIS — M5412 Radiculopathy, cervical region: Secondary | ICD-10-CM | POA: Diagnosis not present

## 2021-10-13 DIAGNOSIS — R29898 Other symptoms and signs involving the musculoskeletal system: Secondary | ICD-10-CM | POA: Diagnosis not present

## 2021-10-13 DIAGNOSIS — G250 Essential tremor: Secondary | ICD-10-CM | POA: Diagnosis not present

## 2021-10-13 LAB — POTASSIUM: Potassium: 5.2 mmol/L — ABNORMAL HIGH (ref 3.5–5.1)

## 2021-10-13 LAB — BASIC METABOLIC PANEL
Anion gap: 8 (ref 5–15)
BUN: 30 mg/dL — ABNORMAL HIGH (ref 8–23)
CO2: 27 mmol/L (ref 22–32)
Calcium: 9.7 mg/dL (ref 8.9–10.3)
Chloride: 99 mmol/L (ref 98–111)
Creatinine, Ser: 1.82 mg/dL — ABNORMAL HIGH (ref 0.61–1.24)
GFR, Estimated: 36 mL/min — ABNORMAL LOW (ref >60)
Glucose, Bld: 268 mg/dL — ABNORMAL HIGH (ref 70–99)
Potassium: 6 mmol/L — ABNORMAL HIGH (ref 3.5–5.1)
Sodium: 134 mmol/L — ABNORMAL LOW (ref 135–145)

## 2021-10-13 LAB — GLUCOSE, CAPILLARY
Glucose-Capillary: 347 mg/dL — ABNORMAL HIGH (ref 70–99)
Glucose-Capillary: 398 mg/dL — ABNORMAL HIGH (ref 70–99)
Glucose-Capillary: 379 mg/dL — ABNORMAL HIGH (ref 70–99)
Glucose-Capillary: 300 mg/dL — ABNORMAL HIGH (ref 70–99)
Glucose-Capillary: 295 mg/dL — ABNORMAL HIGH (ref 70–99)

## 2021-10-13 MED ORDER — SODIUM ZIRCONIUM CYCLOSILICATE 10 G PO PACK
10.0000 g | PACK | ORAL | Status: AC
  Administered 2021-10-13: 10 g via ORAL
  Filled 2021-10-13: qty 1

## 2021-10-13 MED ORDER — INSULIN ASPART 100 UNIT/ML IJ SOLN
6.0000 [IU] | Freq: Three times a day (TID) | INTRAMUSCULAR | Status: DC
  Administered 2021-10-13 – 2021-10-15 (×6): 6 [IU] via SUBCUTANEOUS

## 2021-10-13 MED ORDER — INSULIN DETEMIR 100 UNIT/ML ~~LOC~~ SOLN
15.0000 [IU] | Freq: Two times a day (BID) | SUBCUTANEOUS | Status: DC
  Administered 2021-10-13 – 2021-10-14 (×2): 15 [IU] via SUBCUTANEOUS
  Filled 2021-10-13 (×4): qty 0.15

## 2021-10-13 MED ORDER — INSULIN ASPART 100 UNIT/ML IJ SOLN
0.0000 [IU] | Freq: Three times a day (TID) | INTRAMUSCULAR | Status: DC
  Administered 2021-10-13: 11 [IU] via SUBCUTANEOUS
  Administered 2021-10-14: 20 [IU] via SUBCUTANEOUS
  Administered 2021-10-14: 4 [IU] via SUBCUTANEOUS
  Administered 2021-10-14: 7 [IU] via SUBCUTANEOUS
  Administered 2021-10-15: 11 [IU] via SUBCUTANEOUS
  Administered 2021-10-15: 7 [IU] via SUBCUTANEOUS

## 2021-10-13 MED ORDER — INSULIN ASPART 100 UNIT/ML IJ SOLN: 4.0000 [IU] | Freq: Three times a day (TID) | INTRAMUSCULAR | Status: DC

## 2021-10-13 MED ORDER — INSULIN ASPART 100 UNIT/ML IJ SOLN
0.0000 [IU] | Freq: Every day | INTRAMUSCULAR | Status: DC
  Administered 2021-10-13: 3 [IU] via SUBCUTANEOUS

## 2021-10-13 MED ORDER — INSULIN DETEMIR 100 UNIT/ML ~~LOC~~ SOLN
15.0000 [IU] | SUBCUTANEOUS | Status: AC
  Administered 2021-10-13: 15 [IU] via SUBCUTANEOUS
  Filled 2021-10-13: qty 0.15

## 2021-10-13 NOTE — Care Management Important Message (Signed)
Important Message  Patient Details  Name: [name] MRN: [id number] Date of Birth: 04-03-1935   Medicare Important Message Given:  Yes     Elliot Gurney [place], [place] 10/13/2021, 5:26 PM

## 2021-10-13 NOTE — Progress Notes (Signed)
Has had no tremors today or right sided weakness.  Has been ambulating independently in room.  Blood glucose has been as high as 398 and Dr. Roderic Palau adjusting insulin.  Called wife to bring extra briefs and she cannot bring any due to being sick.  Advised patient that we don't use them and he insists that he use them.

## 2021-10-13 NOTE — Progress Notes (Signed)
If PROGRESS NOTE    [name]  UTM:[id number] DOB: [birth year] DOA: 10/11/2021 PCP: Celene Squibb, MD    Brief Narrative:  87 year old male with multiple medical problems, admitted to the hospital with right-sided weakness and tremors.  Initial concern for CVA.  MRI brain negative for acute infarct.  MRI C-spine indicated possible radiculopathy cause for patient's symptoms.  Seen by neurology.  Started on Medrol dose pack.   Assessment & Plan:   Principal Problem:   CVA (cerebral vascular accident) ([place]) Active Problems:   Mixed hyperlipidemia   Hypertension   GERD (gastroesophageal reflux disease)   Acute kidney injury ([place])   Essential tremor   Type 2 diabetes mellitus with chronic kidney disease, with long-term current use of insulin ([facility])   Right-sided weakness -Initially concerns for CVA, but MRI brain negative -Seen by neurology and MRI C-spine ordered -Felt that his weakness likely related more to cervical radiculopathy -He has been started on a Medrol Dosepak -Has been following neurology, Dr. Jaynee Eagles as an outpatient -He is also seen neurosurgery, Dr. Arnoldo Morale in the past -We will need to follow-up with neurology/neurosurgery as an outpatient -Seen by physical therapy with recommendations for skilled nursing facility -Patient does not want to go to skilled nursing facility  -He has been offered home health in its place, but he feels he does not need this  COVID-positive -No fever or respiratory symptoms -Started on a course of Paxlovid  Hypertension -Resume home dose of Norvasc  Hyperlipidemia -Statin on hold for now due to potential interaction with Paxlovid  Acute kidney injury -Admission creatinine 1.9 -Baseline creatinine around 1.4 -Appears to be adequately hydrated -Discontinue further fluids  Diabetes, type II -Continue on basal insulin and sliding scale -Blood sugars elevated -Increasing basal insulin dose -Adding meal coverage  NovoLog  Essential tremor -Restarted on propranolol  Hyperkalemia -Unclear etiology, question related to steroids -Give a dose of Lokelma -Follow-up potassium   DVT prophylaxis: Place TED hose Start: 10/12/21 1301 heparin injection 5,000 Units Start: 10/11/21 2215 SCD's Start: 10/11/21 2116  Code Status: Full code Family Communication: Discussed with patient Disposition Plan: Status is: Observation  The patient remains OBS appropriate and will d/c before 2 midnights.        Consultants:  Neurology  Procedures:    Antimicrobials:      Subjective: Pain in right arm/shoulder is better today.  Less cramping in that arm.  Objective: Vitals:   10/12/21 2338 10/13/21 0338 10/13/21 0545 10/13/21 1114  BP: 130/75 (!) 158/82 (!) 151/96   Pulse: 60 66 60 71  Resp: 18 17  16   Temp: 98 F (36.7 C) 97.9 F (36.6 C) (!) 97.4 F (36.3 C)   TempSrc:   Oral   SpO2: 96% 94% 95% 96%  Weight:      Height:        Intake/Output Summary (Last 24 hours) at 10/13/2021 1920 Last data filed at 10/13/2021 1742 Gross per 24 hour  Intake 1406 ml  Output --  Net 1406 ml   Filed Weights   10/11/21 1829 10/11/21 2335  Weight: 98.9 kg 94.8 kg    Examination:  General exam: Appears calm and comfortable  Respiratory system: Clear to auscultation. Respiratory effort normal. Cardiovascular system: S1 & S2 heard, RRR. No JVD, murmurs, rubs, gallops or clicks. No pedal edema. Gastrointestinal system: Abdomen is nondistended, soft and nontender. No organomegaly or masses felt. Normal bowel sounds heard. Central nervous system: Alert and oriented. No focal neurological deficits. Extremities:  Symmetric 5 x 5 power. Skin: No rashes, lesions or ulcers Psychiatry: Judgement and insight appear normal. Mood & affect appropriate.     Data Reviewed: I have personally reviewed following labs and imaging studies  CBC: Recent Labs  Lab 10/09/21 0308 10/11/21 1900 10/12/21 0436  WBC  7.6 6.3 5.7  NEUTROABS 6.0 4.9  --   HGB 13.4 14.8 13.5  HCT 40.2 44.3 42.2  MCV 94.1 94.3 93.8  PLT 113* 125* 678*   Basic Metabolic Panel: Recent Labs  Lab 10/09/21 0308 10/11/21 1900 10/12/21 0436 10/13/21 0405 10/13/21 1616  NA 135 134* 136 134*  --   K 4.0 4.7 4.6 6.0* 5.2*  CL 104 98 99 99  --   CO2 22 24 26 27   --   GLUCOSE 341* 272* 294* 268*  --   BUN 31* 23 23 30*  --   CREATININE 1.79* 1.93* 1.74* 1.82*  --   CALCIUM 8.5* 9.0 9.1 9.7  --   MG  --  1.4*  --   --   --    GFR: Estimated Creatinine Clearance: 35.5 mL/min (A) (by C-G formula based on SCr of 1.82 mg/dL (H)). Liver Function Tests: Recent Labs  Lab 10/09/21 0308 10/11/21 1900 10/12/21 0436  AST 20 21 13*  ALT 15 18 13   ALKPHOS 68 61 56  BILITOT 0.7 0.9 0.6  PROT 6.5 6.9 6.0*  ALBUMIN 4.1 4.2 3.7   No results for input(s): LIPASE, AMYLASE in the last 168 hours. No results for input(s): AMMONIA in the last 168 hours. Coagulation Profile: Recent Labs  Lab 10/11/21 1900  INR 1.0   Cardiac Enzymes: No results for input(s): CKTOTAL, CKMB, CKMBINDEX, TROPONINI in the last 168 hours. BNP (last 3 results) No results for input(s): PROBNP in the last 8760 hours. HbA1C: Recent Labs    10/12/21 0436  HGBA1C 8.8*   CBG: Recent Labs  Lab 10/12/21 2105 10/13/21 0856 10/13/21 1121 10/13/21 1356 10/13/21 1713  GLUCAP 281* 347* 398* 379* 300*   Lipid Profile: Recent Labs    10/12/21 0436  CHOL 123  HDL 37*  LDLCALC 65  TRIG 103  CHOLHDL 3.3   Thyroid Function Tests: No results for input(s): TSH, T4TOTAL, FREET4, T3FREE, THYROIDAB in the last 72 hours. Anemia Panel: No results for input(s): VITAMINB12, FOLATE, FERRITIN, TIBC, IRON, RETICCTPCT in the last 72 hours. Sepsis Labs: No results for input(s): PROCALCITON, LATICACIDVEN in the last 168 hours.  Recent Results (from the past 240 hour(s))  Urine Culture     Status: Abnormal   Collection Time: 10/09/21  3:11 AM   Specimen:  Urine, Clean Catch  Result Value Ref Range Status   Specimen Description   Final    URINE, CLEAN CATCH Performed at [facility], [address], [place], [place] [address]    Special Requests   Final    NONE Performed at [facility], [address], [place], [place] [address]    Culture (A)  Final    30,000 COLONIES/mL PSEUDOMONAS AERUGINOSA 30,000 COLONIES/mL ESCHERICHIA COLI    Report Status 10/12/2021 FINAL  Final   Organism ID, Bacteria PSEUDOMONAS AERUGINOSA (A)  Final   Organism ID, Bacteria ESCHERICHIA COLI (A)  Final      Susceptibility   Escherichia coli - MIC*    AMPICILLIN >=32 RESISTANT Resistant     CEFAZOLIN <=4 SENSITIVE Sensitive     CEFEPIME <=0.12 SENSITIVE Sensitive     CEFTRIAXONE <=0.25 SENSITIVE Sensitive  CIPROFLOXACIN <=0.25 SENSITIVE Sensitive     GENTAMICIN <=1 SENSITIVE Sensitive     IMIPENEM <=0.25 SENSITIVE Sensitive     NITROFURANTOIN <=16 SENSITIVE Sensitive     TRIMETH/SULFA <=20 SENSITIVE Sensitive     AMPICILLIN/SULBACTAM 16 INTERMEDIATE Intermediate     PIP/TAZO <=4 SENSITIVE Sensitive     * 30,000 COLONIES/mL ESCHERICHIA COLI   Pseudomonas aeruginosa - MIC*    CEFTAZIDIME 8 SENSITIVE Sensitive     CIPROFLOXACIN <=0.25 SENSITIVE Sensitive     GENTAMICIN <=1 SENSITIVE Sensitive     IMIPENEM 1 SENSITIVE Sensitive     * 30,000 COLONIES/mL PSEUDOMONAS AERUGINOSA  Resp Panel by RT-PCR (Flu A&B, Covid) Nasopharyngeal Swab     Status: Abnormal   Collection Time: 10/11/21  7:12 PM   Specimen: Nasopharyngeal Swab; Nasopharyngeal(NP) swabs in vial transport medium  Result Value Ref Range Status   SARS Coronavirus 2 by RT PCR POSITIVE (A) NEGATIVE Final    Comment: (NOTE) SARS-CoV-2 target nucleic acids are DETECTED.  The SARS-CoV-2 RNA is generally detectable in upper respiratory specimens during the acute phase of infection. Positive results are indicative of the presence of the identified virus, but do not rule out bacterial infection or  co-infection with other pathogens not detected by the test. Clinical correlation with patient history and other diagnostic information is necessary to determine patient infection status. The expected result is Negative.  Fact Sheet for Patients: EntrepreneurPulse.com.au  Fact Sheet for Healthcare Providers: IncredibleEmployment.be  This test is not yet approved or cleared by the [place] FDA and  has been authorized for detection and/or diagnosis of SARS-CoV-2 by FDA under an Emergency Use Authorization (EUA).  This EUA will remain in effect (meaning this test can be used) for the duration of  the COVID-19 declaration under Section 564(b)(1) of the A ct, 21 U.S.C. section 360bbb-3(b)(1), unless the authorization is terminated or revoked sooner.     Influenza A by PCR NEGATIVE NEGATIVE Final   Influenza B by PCR NEGATIVE NEGATIVE Final    Comment: (NOTE) The Xpert Xpress SARS-CoV-2/FLU/RSV plus assay is intended as an aid in the diagnosis of influenza from Nasopharyngeal swab specimens and should not be used as a sole basis for treatment. Nasal washings and aspirates are unacceptable for Xpert Xpress SARS-CoV-2/FLU/RSV testing.  Fact Sheet for Patients: EntrepreneurPulse.com.au  Fact Sheet for Healthcare Providers: IncredibleEmployment.be  This test is not yet approved or cleared by the [place] FDA and has been authorized for detection and/or diagnosis of SARS-CoV-2 by FDA under an Emergency Use Authorization (EUA). This EUA will remain in effect (meaning this test can be used) for the duration of the COVID-19 declaration under Section 564(b)(1) of the Act, 21 U.S.C. section 360bbb-3(b)(1), unless the authorization is terminated or revoked.  Performed at [facility], [address], [place], [place] [address]          Radiology Studies: DG Shoulder Right  Result Date:  10/12/2021 CLINICAL DATA:  Fall, right shoulder pain, decreased range of motion EXAM: RIGHT SHOULDER - 2+ VIEW COMPARISON:  None. FINDINGS: There is no evidence of acute fracture or dislocation. Glenohumeral and acromioclavicular alignment is maintained. There is moderate degenerative change about the [facility] joint. Scattered foci of calcification about the greater tuberosity may reflect calcific tendinitis. IMPRESSION: 1. No acute fracture or dislocation. 2. Calcifications about the greater tuberosity may reflect calcific tendinitis. 3. Moderate degenerative changes about the [facility] joint. Electronically Signed   By: Valetta Mole M.D.   On: 10/12/2021 11:50  CT Head Wo Contrast  Result Date: 10/11/2021 CLINICAL DATA:  Neuro deficit, acute, stroke suspected R sided weakness EXAM: CT HEAD WITHOUT CONTRAST TECHNIQUE: Contiguous axial images were obtained from the base of the skull through the vertex without intravenous contrast. RADIATION DOSE REDUCTION: This exam was performed according to the departmental dose-optimization program which includes automated exposure control, adjustment of the mA and/or kV according to patient size and/or use of iterative reconstruction technique. COMPARISON:  None. BRAIN: BRAIN Cerebral ventricle sizes are concordant with the degree of cerebral volume loss. Patchy and confluent areas of decreased attenuation are noted throughout the deep and periventricular white matter of the cerebral hemispheres bilaterally, compatible with chronic microvascular ischemic disease. No evidence of large-territorial acute infarction. No parenchymal hemorrhage. No mass lesion. No extra-axial collection. No mass effect or midline shift. No hydrocephalus. Basilar cisterns are patent. Vascular: No hyperdense vessel. Skull: No acute fracture or focal lesion. Sinuses/Orbits: Paranasal sinuses and mastoid air cells are clear. Bilateral lens replacement. Otherwise the orbits are unremarkable. Other: None.  IMPRESSION: No acute intracranial abnormality. Electronically Signed   By: Iven Finn M.D.   On: 10/11/2021 19:47   MR ANGIO HEAD WO CONTRAST  Result Date: 10/12/2021 CLINICAL DATA:  Stroke/TIA, determine embolic source; Neuro deficit, acute, stroke suspected; right-sided weakness EXAM: MRI HEAD WITHOUT CONTRAST MRA HEAD WITHOUT CONTRAST TECHNIQUE: Multiplanar, multi-echo pulse sequences of the brain and surrounding structures were acquired without intravenous contrast. Angiographic images of the Circle of Willis were acquired using MRA technique without intravenous contrast. COMPARISON:  MR brain 2017 FINDINGS: MRI HEAD Brain: There is no acute infarction or intracranial hemorrhage. There is no intracranial mass, mass effect, or edema. There is no hydrocephalus or extra-axial fluid collection. Prominence of the ventricles and sulci reflects parenchymal volume loss. Patchy and confluent T2 hyperintensity in the supratentorial and pontine white matter is nonspecific but probably reflects mild to moderate chronic microvascular ischemic changes. Vascular: Major vessel flow voids at the skull base are preserved. Skull and upper cervical spine: Normal marrow signal is preserved. Sinuses/Orbits: Paranasal sinuses are aerated. Bilateral lens replacements. Other: Sella is unremarkable.  Mastoid air cells are clear. MRA HEAD Intracranial internal carotid arteries are patent. Middle and anterior cerebral arteries are patent. Intracranial vertebral arteries, basilar artery, posterior cerebral arteries are patent. Right posterior communicating artery is present with infundibular origin there is no significant stenosis or aneurysm. IMPRESSION: No evidence of recent infarction, hemorrhage, or mass. Mild to moderate chronic microvascular ischemic changes. No proximal intracranial vessel occlusion. Electronically Signed   By: Macy Mis M.D.   On: 10/12/2021 11:11   MR BRAIN WO CONTRAST  Result Date:  10/12/2021 CLINICAL DATA:  Stroke/TIA, determine embolic source; Neuro deficit, acute, stroke suspected; right-sided weakness EXAM: MRI HEAD WITHOUT CONTRAST MRA HEAD WITHOUT CONTRAST TECHNIQUE: Multiplanar, multi-echo pulse sequences of the brain and surrounding structures were acquired without intravenous contrast. Angiographic images of the Circle of Willis were acquired using MRA technique without intravenous contrast. COMPARISON:  MR brain 2017 FINDINGS: MRI HEAD Brain: There is no acute infarction or intracranial hemorrhage. There is no intracranial mass, mass effect, or edema. There is no hydrocephalus or extra-axial fluid collection. Prominence of the ventricles and sulci reflects parenchymal volume loss. Patchy and confluent T2 hyperintensity in the supratentorial and pontine white matter is nonspecific but probably reflects mild to moderate chronic microvascular ischemic changes. Vascular: Major vessel flow voids at the skull base are preserved. Skull and upper cervical spine: Normal marrow signal is preserved. Sinuses/Orbits:  Paranasal sinuses are aerated. Bilateral lens replacements. Other: Sella is unremarkable.  Mastoid air cells are clear. MRA HEAD Intracranial internal carotid arteries are patent. Middle and anterior cerebral arteries are patent. Intracranial vertebral arteries, basilar artery, posterior cerebral arteries are patent. Right posterior communicating artery is present with infundibular origin there is no significant stenosis or aneurysm. IMPRESSION: No evidence of recent infarction, hemorrhage, or mass. Mild to moderate chronic microvascular ischemic changes. No proximal intracranial vessel occlusion. Electronically Signed   By: Macy Mis M.D.   On: 10/12/2021 11:11   MR CERVICAL SPINE WO CONTRAST  Result Date: 10/12/2021 CLINICAL DATA:  Cervical radiculopathy, no red flags. EXAM: MRI CERVICAL SPINE WITHOUT CONTRAST TECHNIQUE: Multiplanar, multisequence MR imaging of the  cervical spine was performed. No intravenous contrast was administered. COMPARISON:  CT myelogram September 23, 2007. FINDINGS: The study is degraded by motion. Alignment: Small anterolisthesis at C2-3, C3-4 and C7-T1. Vertebrae: No fracture, evidence of discitis, or bone lesion. Postsurgical changes from anterior fusion at C5-C7. Cord: Normal signal and morphology. Posterior Fossa, vertebral arteries, paraspinal tissues: T2 hyperintensity within the pons, nonspecific, most likely related to chronic small vessel disease, better seen on recent MRI of the brain. Left atlantooccipital joint effusion. Disc levels: C2-3: Posterior disc protrusion and thickening of the ligamentum flavum resulting in mild spinal canal stenosis. Uncovertebral and facet degenerative changes resulting mild bilateral neural foraminal narrowing. C3-4: Posterior disc osteophyte complex resulting in mild spinal canal stenosis. Uncovertebral and facet degenerative changes resulting in severe right and moderate left neural foraminal narrowing. C4-5: Posterior disc osteophyte complex without significant spinal canal stenosis. Uncovertebral and facet degenerative changes resulting in moderate bilateral neural foraminal narrowing. C5-6: Uncovertebral and facet degenerative changes resulting in moderate right and mild left neural foraminal narrowing. No spinal canal stenosis. C6-7: No spinal canal stenosis. Uncovertebral and facet degenerative changes resulting moderate right and severe left neural foraminal narrowing. C7-T1: Disc bulge/disc uncovering without significant spinal canal stenosis. Hypertrophic facet degenerative changes without significant neural foraminal narrowing. IMPRESSION: 1. Status post C5-C7 fusion. 2. Degenerative changes of the cervical spine with multilevel neural foraminal narrowing, as described above. 3. Mild spinal canal stenosis at C5-2 3 and C3-4. Electronically Signed   By: Pedro Earls M.D.   On:  10/12/2021 12:17   US Carotid Bilateral (at [facility] and [facility] only)  Result Date: 10/12/2021 CLINICAL DATA:  Stroke, hyperlipidemia, diabetes EXAM: BILATERAL CAROTID DUPLEX ULTRASOUND TECHNIQUE: Pearline Cables scale imaging, color Doppler and duplex ultrasound were performed of bilateral carotid and vertebral arteries in the neck. COMPARISON:  None. FINDINGS: Criteria: Quantification of carotid stenosis is based on velocity parameters that correlate the residual internal carotid diameter with NASCET-based stenosis levels, using the diameter of the distal internal carotid lumen as the denominator for stenosis measurement. The following velocity measurements were obtained: RIGHT ICA: 60/10 cm/sec CCA: 17/7 cm/sec SYSTOLIC ICA/CCA RATIO:  1.0 ECA: 79 cm/sec LEFT ICA: 108/29 cm/sec CCA: 93/90 cm/sec SYSTOLIC ICA/CCA RATIO:  1.6 ECA: 87 cm/sec RIGHT CAROTID ARTERY: Mild tortuosity. Mild intimal thickening in the common carotid artery and bulb. Eccentric partially calcified plaque in the proximal ICA resulting in only mild stenosis. Normal waveforms and color Doppler signal throughout. RIGHT VERTEBRAL ARTERY:  Normal flow direction and waveform. LEFT CAROTID ARTERY: Intimal thickening in the common carotid artery. Mild eccentric noncalcified smooth nonocclusive plaque in the bulb and ECA origin. Normal waveforms and color Doppler signal throughout. LEFT VERTEBRAL ARTERY:  Normal flow direction and waveform. IMPRESSION: 1. Bilateral carotid bifurcation  plaque resulting in less than 50% diameter ICA stenosis. 2. Antegrade bilateral vertebral arterial flow. Electronically Signed   By: Lucrezia Europe M.D.   On: 10/12/2021 13:57   ECHOCARDIOGRAM COMPLETE  Result Date: 10/12/2021    ECHOCARDIOGRAM REPORT   Patient Name:   [name] Date of Exam: 10/12/2021 Medical Rec #:  [id number]         Height:       71.0 in Accession #:    [id number]        Weight:       209.0 lb Date of Birth:  [birth year]        BSA:          2.148 m Patient  Age:    87 years          BP:           153/96 mmHg Patient Gender: M                 HR:           56 bpm. Exam Location:  [facility] Procedure: 2D Echo, Cardiac Doppler and Color Doppler Indications:    Stroke  History:        Patient has prior history of Echocardiogram examinations, most                 recent 01/11/2010. CAD, Stroke, Signs/Symptoms:Shortness of                 Breath; Risk Factors:Hypertension, Diabetes and Dyslipidemia.  Sonographer:    Wenda Low Referring Phys: [id number] ASIA B [place]  1. Left ventricular ejection fraction, by estimation, is 50 to 55%. Left ventricular ejection fraction by 2D MOD biplane is 53.1 %. The left ventricle has low normal function. The left ventricle has no regional wall motion abnormalities. There is moderate concentric left ventricular hypertrophy. Left ventricular diastolic parameters are consistent with Grade I diastolic dysfunction (impaired relaxation).  2. Right ventricular systolic function is normal. The right ventricular size is normal. Tricuspid regurgitation signal is inadequate for assessing PA pressure.  3. The mitral valve is grossly normal. Trivial mitral valve regurgitation. No evidence of mitral stenosis.  4. The aortic valve is tricuspid. Aortic valve regurgitation is mild. No aortic stenosis is present.  5. The inferior vena cava is normal in size with greater than 50% respiratory variability, suggesting right atrial pressure of 3 mmHg. Conclusion(s)/Recommendation(s): No intracardiac source of embolism detected on this transthoracic study. Consider a transesophageal echocardiogram to exclude cardiac source of embolism if clinically indicated. FINDINGS  Left Ventricle: Left ventricular ejection fraction, by estimation, is 50 to 55%. Left ventricular ejection fraction by 2D MOD biplane is 53.1 %. The left ventricle has low normal function. The left ventricle has no regional wall motion abnormalities. The left ventricular  internal cavity size was normal in size. There is moderate concentric left ventricular hypertrophy. Left ventricular diastolic parameters are consistent with Grade I diastolic dysfunction (impaired relaxation). Right Ventricle: The right ventricular size is normal. No increase in right ventricular wall thickness. Right ventricular systolic function is normal. Tricuspid regurgitation signal is inadequate for assessing PA pressure. Left Atrium: Left atrial size was normal in size. Right Atrium: Right atrial size was normal in size. Pericardium: There is no evidence of pericardial effusion. Mitral Valve: The mitral valve is grossly normal. Trivial mitral valve regurgitation. No evidence of mitral valve stenosis. MV peak gradient, 3.0 mmHg. The mean mitral valve gradient is 1.0 mmHg.  Tricuspid Valve: The tricuspid valve is grossly normal. Tricuspid valve regurgitation is trivial. No evidence of tricuspid stenosis. Aortic Valve: The aortic valve is tricuspid. Aortic valve regurgitation is mild. No aortic stenosis is present. Aortic valve mean gradient measures 2.0 mmHg. Aortic valve peak gradient measures 2.7 mmHg. Aortic valve area, by VTI measures 3.50 cm. Pulmonic Valve: The pulmonic valve was grossly normal. Pulmonic valve regurgitation is not visualized. No evidence of pulmonic stenosis. Aorta: The aortic root and ascending aorta are structurally normal, with no evidence of dilitation. Venous: The inferior vena cava is normal in size with greater than 50% respiratory variability, suggesting right atrial pressure of 3 mmHg. IAS/Shunts: The atrial septum is grossly normal.  LEFT VENTRICLE PLAX 2D                        Biplane EF (MOD) LVIDd:         4.70 cm         LV Biplane EF:   Left LVIDs:         3.10 cm                          ventricular LV PW:         1.40 cm                          ejection LV IVS:        1.40 cm                          fraction by LVOT diam:     2.20 cm                          2D MOD  LV SV:         71                               biplane is LV SV Index:   33                               53.1 %. LVOT Area:     3.80 cm                                Diastology                                LV e' medial:    5.00 cm/s LV Volumes (MOD)               LV E/e' medial:  12.7 LV vol d, MOD    48.6 ml       LV e' lateral:   12.00 cm/s A2C:                           LV E/e' lateral: 5.3 LV vol d, MOD    47.3 ml A4C: LV vol s, MOD    22.7 ml A2C: LV vol s, MOD    22.6 ml A4C: LV SV MOD A2C:   25.9 ml LV SV  MOD A4C:   47.3 ml LV SV MOD BP:    25.7 ml RIGHT VENTRICLE RV Basal diam:  3.50 cm RV Mid diam:    2.50 cm RV S prime:     12.20 cm/s TAPSE (M-mode): 2.3 cm LEFT ATRIUM             Index        RIGHT ATRIUM           Index LA diam:        3.80 cm 1.77 cm/m   RA Area:     12.40 cm LA Vol (A2C):   45.0 ml 20.95 ml/m  RA Volume:   22.40 ml  10.43 ml/m LA Vol (A4C):   40.5 ml 18.85 ml/m LA Biplane Vol: 46.1 ml 21.46 ml/m  AORTIC VALVE                    PULMONIC VALVE AV Area (Vmax):    3.77 cm     PV Vmax:       0.78 m/s AV Area (Vmean):   3.25 cm     PV Peak grad:  2.5 mmHg AV Area (VTI):     3.50 cm AV Vmax:           82.30 cm/s AV Vmean:          60.400 cm/s AV VTI:            0.204 m AV Peak Grad:      2.7 mmHg AV Mean Grad:      2.0 mmHg LVOT Vmax:         81.60 cm/s LVOT Vmean:        51.600 cm/s LVOT VTI:          0.188 m LVOT/AV VTI ratio: 0.92  AORTA Ao Root diam: 3.30 cm Ao Asc diam:  3.30 cm MITRAL VALVE MV Area (PHT): 2.87 cm    SHUNTS MV Area VTI:   2.46 cm    Systemic VTI:  0.19 m MV Peak grad:  3.0 mmHg    Systemic Diam: 2.20 cm MV Mean grad:  1.0 mmHg MV Vmax:       0.87 m/s MV Vmean:      41.9 cm/s MV Decel Time: 264 msec MV E velocity: 63.40 cm/s MV A velocity: 81.40 cm/s MV E/A ratio:  0.78 Eleonore Chiquito MD Electronically signed by Eleonore Chiquito MD Signature Date/Time: 10/12/2021/3:26:15 PM    Final         Scheduled Meds:  gabapentin  300 mg Oral Daily   And    gabapentin  600 mg Oral QHS   heparin  5,000 Units Subcutaneous Q8H   insulin aspart  0-20 Units Subcutaneous TID WC   insulin aspart  0-5 Units Subcutaneous QHS   insulin aspart  6 Units Subcutaneous TID WC   insulin detemir  15 Units Subcutaneous BID   ipratropium  2 spray Each Nare BID   LORazepam  0.5 mg Oral QHS   mometasone-formoterol  2 puff Inhalation BID   nirmatrelvir/ritonavir EUA (renal dosing)  2 tablet Oral BID   propranolol ER  60 mg Oral QHS   Continuous Infusions:     LOS: 0 days    Time spent: 35 minutes    Kathie Dike, MD [facility]   If 7PM-7AM, please contact night-coverage [email or web address]  10/13/2021, 7:20 PM

## 2021-10-13 NOTE — Discharge Instructions (Signed)

## 2021-10-14 DIAGNOSIS — I10 Essential (primary) hypertension: Secondary | ICD-10-CM | POA: Diagnosis not present

## 2021-10-14 DIAGNOSIS — N179 Acute kidney failure, unspecified: Secondary | ICD-10-CM | POA: Diagnosis not present

## 2021-10-14 DIAGNOSIS — M5412 Radiculopathy, cervical region: Secondary | ICD-10-CM | POA: Diagnosis not present

## 2021-10-14 DIAGNOSIS — K219 Gastro-esophageal reflux disease without esophagitis: Secondary | ICD-10-CM | POA: Diagnosis not present

## 2021-10-14 DIAGNOSIS — G250 Essential tremor: Secondary | ICD-10-CM | POA: Diagnosis not present

## 2021-10-14 LAB — CBC
HCT: 42.8 % (ref 39.0–52.0)
Hemoglobin: 14.2 g/dL (ref 13.0–17.0)
MCH: 31.1 pg (ref 26.0–34.0)
MCHC: 33.2 g/dL (ref 30.0–36.0)
MCV: 93.9 fL (ref 80.0–100.0)
Platelets: 147 10*3/uL — ABNORMAL LOW (ref 150–400)
RBC: 4.56 MIL/uL (ref 4.22–5.81)
RDW: 14.9 % (ref 11.5–15.5)
WBC: 12.8 10*3/uL — ABNORMAL HIGH (ref 4.0–10.5)
nRBC: 0 % (ref 0.0–0.2)

## 2021-10-14 LAB — COMPREHENSIVE METABOLIC PANEL
ALT: 16 U/L (ref 0–44)
AST: 14 U/L — ABNORMAL LOW (ref 15–41)
Albumin: 4.2 g/dL (ref 3.5–5.0)
Alkaline Phosphatase: 60 U/L (ref 38–126)
Anion gap: 8 (ref 5–15)
BUN: 46 mg/dL — ABNORMAL HIGH (ref 8–23)
CO2: 27 mmol/L (ref 22–32)
Calcium: 9.4 mg/dL (ref 8.9–10.3)
Chloride: 99 mmol/L (ref 98–111)
Creatinine, Ser: 1.76 mg/dL — ABNORMAL HIGH (ref 0.61–1.24)
GFR, Estimated: 38 mL/min — ABNORMAL LOW (ref >60)
Glucose, Bld: 151 mg/dL — ABNORMAL HIGH (ref 70–99)
Potassium: 5.3 mmol/L — ABNORMAL HIGH (ref 3.5–5.1)
Sodium: 134 mmol/L — ABNORMAL LOW (ref 135–145)
Total Bilirubin: 0.4 mg/dL (ref 0.3–1.2)
Total Protein: 6.6 g/dL (ref 6.5–8.1)

## 2021-10-14 LAB — GLUCOSE, CAPILLARY
Glucose-Capillary: 219 mg/dL — ABNORMAL HIGH (ref 70–99)
Glucose-Capillary: 376 mg/dL — ABNORMAL HIGH (ref 70–99)
Glucose-Capillary: 164 mg/dL — ABNORMAL HIGH (ref 70–99)
Glucose-Capillary: 172 mg/dL — ABNORMAL HIGH (ref 70–99)

## 2021-10-14 MED ORDER — INSULIN DETEMIR 100 UNIT/ML ~~LOC~~ SOLN
10.0000 [IU] | SUBCUTANEOUS | Status: AC
  Administered 2021-10-14: 10 [IU] via SUBCUTANEOUS
  Filled 2021-10-14: qty 0.1

## 2021-10-14 MED ORDER — INSULIN DETEMIR 100 UNIT/ML ~~LOC~~ SOLN
20.0000 [IU] | Freq: Two times a day (BID) | SUBCUTANEOUS | Status: DC
  Administered 2021-10-14 – 2021-10-15 (×2): 20 [IU] via SUBCUTANEOUS
  Filled 2021-10-14 (×4): qty 0.2

## 2021-10-14 MED ORDER — MECLIZINE HCL 12.5 MG PO TABS
25.0000 mg | ORAL_TABLET | Freq: Three times a day (TID) | ORAL | Status: DC
  Administered 2021-10-14 – 2021-10-15 (×3): 25 mg via ORAL
  Filled 2021-10-14 (×3): qty 2

## 2021-10-14 MED ORDER — SODIUM ZIRCONIUM CYCLOSILICATE 10 G PO PACK
10.0000 g | PACK | Freq: Once | ORAL | Status: AC
  Administered 2021-10-14: 10 g via ORAL
  Filled 2021-10-14: qty 1

## 2021-10-14 MED ORDER — GERHARDT'S BUTT CREAM
TOPICAL_CREAM | CUTANEOUS | Status: DC | PRN
  Filled 2021-10-14: qty 1

## 2021-10-14 NOTE — TOC Progression Note (Signed)
Transition of Care [facility]) - Progression Note    Patient Details  Name: [name] MRN: [id number] Date of Birth: [birth year]  Transition of Care [facility]) CM/SW Contact  [address] [place], [place] Phone Number: 10/14/2021, 10:24 AM  Clinical Narrative:    Possible discharge home today. Patient's spouse available and will provide transport home. Patient declining SNF and HH at this time.  Transition of Care to continue to follow for discharge needs .  [address], LCSW Transition of Care [phone]    Expected Discharge Plan: [place] Barriers to Discharge: Continued Medical Work up  Expected Discharge Plan and Services Expected Discharge Plan: [place]                                       Date [place]: 10/12/21 Time [place]: 8022 Representative spoke with at [place]: Penni Homans   Social Determinants of Health (SDOH) Interventions    Readmission Risk Interventions Readmission Risk Prevention Plan 08/30/2021 06/12/2021 06/12/2021  Transportation Screening Complete Complete -  PCP or Specialist Appt within 3-5 Days Complete - Complete  HRI or Home Care Consult Complete - Complete  Social Work Consult for [place] Planning/Counseling Complete - Complete  Palliative Care Screening Not Applicable - Not Applicable  Medication Review Press photographer) Complete - Complete  Some recent data might be hidden

## 2021-10-14 NOTE — Progress Notes (Signed)
If PROGRESS NOTE    [name]  XIH:[id number] DOB: [birth year] DOA: 10/11/2021 PCP: Celene Squibb, MD    Brief Narrative:  87 year old male with multiple medical problems, admitted to the hospital with right-sided weakness and tremors.  Initial concern for CVA.  MRI brain negative for acute infarct.  MRI C-spine indicated possible radiculopathy cause for patient's symptoms.  Seen by neurology.  Started on Medrol dose pack. Developed hyperkalemia. Also developed vertigo which has made it difficult to ambulate   Assessment & Plan:   Principal Problem:   CVA (cerebral vascular accident) ([place]) Active Problems:   Mixed hyperlipidemia   Hypertension   GERD (gastroesophageal reflux disease)   Acute kidney injury ([place])   Essential tremor   Type 2 diabetes mellitus with chronic kidney disease, with long-term current use of insulin (HCC)   Right-sided weakness -Initially concerns for CVA, but MRI brain negative -Seen by neurology and MRI C-spine ordered -Felt that his weakness likely related more to cervical radiculopathy -He has been started on a Medrol Dosepak -Has been following neurology, Dr. Jaynee Eagles as an outpatient -He is also seen neurosurgery, Dr. Arnoldo Morale in the past -We will need to follow-up with neurology/neurosurgery as an outpatient -Seen by physical therapy with recommendations for skilled nursing facility -Patient initially declined SNF placement -He has been offered home health in its place, but he feels he does not need this -due to his dizziness (see below), he is concerned about going home in current state -will ask PT to reassess. If PT continues to rec SNF, he is willing to consider it  Dizziness/Vertigo -orthostatics negative -difficulty ambulating -started on meclizine -he is concerned about going home in his current state since he feels his wife will not be able to care for him -will ask PT to reassess in AM  COVID-positive -No fever or respiratory  symptoms -Started on a course of Paxlovid  Hypertension -Continue home dose of Norvasc -BP stable  Hyperlipidemia -Statin on hold for now due to potential interaction with Paxlovid  Acute kidney injury -Admission creatinine 1.9 -Baseline creatinine around 1.4 -Appears to be adequately hydrated -Discontinue further fluids -check renal ulrasound  Diabetes, type II, uncontrolled with hyperglycemia -A1c 8.8 -Continue on basal insulin and sliding scale -Blood sugars elevated -Increasing basal insulin dose -Adding meal coverage NovoLog  Essential tremor -Restarted on propranolol  Hyperkalemia -Unclear etiology, question related to steroids -potassium up to 6.0 -patient received lokelma with potassium down to 5.3 -will give another dose of lokelma today -Follow-up potassium   DVT prophylaxis: Place TED hose Start: 10/12/21 1301 heparin injection 5,000 Units Start: 10/11/21 2215 SCD's Start: 10/11/21 2116  Code Status: Full code Family Communication: Discussed with patient Disposition Plan: Status is: Observation  The patient remains OBS appropriate and will d/c before 2 midnights.        Consultants:  Neurology  Procedures:    Antimicrobials:      Subjective: Had difficulty ambulating today and felt as though he was doing to pass out. Also felt as though room was spinning. He is sitting up in chair at this time, and feels too unsteady to stand  Objective: Vitals:   10/13/21 2023 10/14/21 0530 10/14/21 0848 10/14/21 1400  BP:  115/60 133/73 140/78  Pulse:  (!) 57 62 68  Resp:  18  16  Temp:  98.8 F (37.1 C)  98 F (36.7 C)  TempSrc:    Oral  SpO2: 91% 95%  97%  Weight:  Height:        Intake/Output Summary (Last 24 hours) at 10/14/2021 1919 Last data filed at 10/14/2021 0500 Gross per 24 hour  Intake 360 ml  Output --  Net 360 ml   Filed Weights   10/11/21 1829 10/11/21 2335  Weight: 98.9 kg 94.8 kg    Examination:  General exam:  Appears calm and comfortable  Respiratory system: Clear to auscultation. Respiratory effort normal. Cardiovascular system: S1 & S2 heard, RRR. No JVD, murmurs, rubs, gallops or clicks. No pedal edema. Gastrointestinal system: Abdomen is nondistended, soft and nontender. No organomegaly or masses felt. Normal bowel sounds heard. Central nervous system: Alert and oriented. No focal neurological deficits. Extremities: Symmetric 5 x 5 power. Skin: No rashes, lesions or ulcers Psychiatry: Judgement and insight appear normal. Mood & affect appropriate.     Data Reviewed: I have personally reviewed following labs and imaging studies  CBC: Recent Labs  Lab 10/09/21 0308 10/11/21 1900 10/12/21 0436 10/14/21 1634  WBC 7.6 6.3 5.7 12.8*  NEUTROABS 6.0 4.9  --   --   HGB 13.4 14.8 13.5 14.2  HCT 40.2 44.3 42.2 42.8  MCV 94.1 94.3 93.8 93.9  PLT 113* 125* 129* 478*   Basic Metabolic Panel: Recent Labs  Lab 10/09/21 0308 10/11/21 1900 10/12/21 0436 10/13/21 0405 10/13/21 1616 10/14/21 1634  NA 135 134* 136 134*  --  134*  K 4.0 4.7 4.6 6.0* 5.2* 5.3*  CL 104 98 99 99  --  99  CO2 22 24 26 27   --  27  GLUCOSE 341* 272* 294* 268*  --  151*  BUN 31* 23 23 30*  --  46*  CREATININE 1.79* 1.93* 1.74* 1.82*  --  1.76*  CALCIUM 8.5* 9.0 9.1 9.7  --  9.4  MG  --  1.4*  --   --   --   --    GFR: Estimated Creatinine Clearance: 36.7 mL/min (A) (by C-G formula based on SCr of 1.76 mg/dL (H)). Liver Function Tests: Recent Labs  Lab 10/09/21 0308 10/11/21 1900 10/12/21 0436 10/14/21 1634  AST 20 21 13* 14*  ALT 15 18 13 16   ALKPHOS 68 61 56 60  BILITOT 0.7 0.9 0.6 0.4  PROT 6.5 6.9 6.0* 6.6  ALBUMIN 4.1 4.2 3.7 4.2   No results for input(s): LIPASE, AMYLASE in the last 168 hours. No results for input(s): AMMONIA in the last 168 hours. Coagulation Profile: Recent Labs  Lab 10/11/21 1900  INR 1.0   Cardiac Enzymes: No results for input(s): CKTOTAL, CKMB, CKMBINDEX, TROPONINI  in the last 168 hours. BNP (last 3 results) No results for input(s): PROBNP in the last 8760 hours. HbA1C: Recent Labs    10/12/21 0436  HGBA1C 8.8*   CBG: Recent Labs  Lab 10/13/21 1713 10/13/21 2045 10/14/21 0757 10/14/21 1122 10/14/21 1621  GLUCAP 300* 295* 219* 376* 164*   Lipid Profile: Recent Labs    10/12/21 0436  CHOL 123  HDL 37*  LDLCALC 65  TRIG 103  CHOLHDL 3.3   Thyroid Function Tests: No results for input(s): TSH, T4TOTAL, FREET4, T3FREE, THYROIDAB in the last 72 hours. Anemia Panel: No results for input(s): VITAMINB12, FOLATE, FERRITIN, TIBC, IRON, RETICCTPCT in the last 72 hours. Sepsis Labs: No results for input(s): PROCALCITON, LATICACIDVEN in the last 168 hours.  Recent Results (from the past 240 hour(s))  Urine Culture     Status: Abnormal   Collection Time: 10/09/21  3:11 AM   Specimen:  Urine, Clean Catch  Result Value Ref Range Status   Specimen Description   Final    URINE, CLEAN CATCH Performed at [facility], [address], [place], [place] [address]    Special Requests   Final    NONE Performed at [facility], [address], [place], [place] [address]    Culture (A)  Final    30,000 COLONIES/mL PSEUDOMONAS AERUGINOSA 30,000 COLONIES/mL ESCHERICHIA COLI    Report Status 10/12/2021 FINAL  Final   Organism ID, Bacteria PSEUDOMONAS AERUGINOSA (A)  Final   Organism ID, Bacteria ESCHERICHIA COLI (A)  Final      Susceptibility   Escherichia coli - MIC*    AMPICILLIN >=32 RESISTANT Resistant     CEFAZOLIN <=4 SENSITIVE Sensitive     CEFEPIME <=0.12 SENSITIVE Sensitive     CEFTRIAXONE <=0.25 SENSITIVE Sensitive     CIPROFLOXACIN <=0.25 SENSITIVE Sensitive     GENTAMICIN <=1 SENSITIVE Sensitive     IMIPENEM <=0.25 SENSITIVE Sensitive     NITROFURANTOIN <=16 SENSITIVE Sensitive     TRIMETH/SULFA <=20 SENSITIVE Sensitive     AMPICILLIN/SULBACTAM 16 INTERMEDIATE Intermediate     PIP/TAZO <=4 SENSITIVE Sensitive     * 30,000  COLONIES/mL ESCHERICHIA COLI   Pseudomonas aeruginosa - MIC*    CEFTAZIDIME 8 SENSITIVE Sensitive     CIPROFLOXACIN <=0.25 SENSITIVE Sensitive     GENTAMICIN <=1 SENSITIVE Sensitive     IMIPENEM 1 SENSITIVE Sensitive     * 30,000 COLONIES/mL PSEUDOMONAS AERUGINOSA  Resp Panel by RT-PCR (Flu A&B, Covid) Nasopharyngeal Swab     Status: Abnormal   Collection Time: 10/11/21  7:12 PM   Specimen: Nasopharyngeal Swab; Nasopharyngeal(NP) swabs in vial transport medium  Result Value Ref Range Status   SARS Coronavirus 2 by RT PCR POSITIVE (A) NEGATIVE Final    Comment: (NOTE) SARS-CoV-2 target nucleic acids are DETECTED.  The SARS-CoV-2 RNA is generally detectable in upper respiratory specimens during the acute phase of infection. Positive results are indicative of the presence of the identified virus, but do not rule out bacterial infection or co-infection with other pathogens not detected by the test. Clinical correlation with patient history and other diagnostic information is necessary to determine patient infection status. The expected result is Negative.  Fact Sheet for Patients: EntrepreneurPulse.com.au  Fact Sheet for Healthcare Providers: IncredibleEmployment.be  This test is not yet approved or cleared by the [place] FDA and  has been authorized for detection and/or diagnosis of SARS-CoV-2 by FDA under an Emergency Use Authorization (EUA).  This EUA will remain in effect (meaning this test can be used) for the duration of  the COVID-19 declaration under Section 564(b)(1) of the A ct, 21 U.S.C. section 360bbb-3(b)(1), unless the authorization is terminated or revoked sooner.     Influenza A by PCR NEGATIVE NEGATIVE Final   Influenza B by PCR NEGATIVE NEGATIVE Final    Comment: (NOTE) The Xpert Xpress SARS-CoV-2/FLU/RSV plus assay is intended as an aid in the diagnosis of influenza from Nasopharyngeal swab specimens and should not be  used as a sole basis for treatment. Nasal washings and aspirates are unacceptable for Xpert Xpress SARS-CoV-2/FLU/RSV testing.  Fact Sheet for Patients: EntrepreneurPulse.com.au  Fact Sheet for Healthcare Providers: IncredibleEmployment.be  This test is not yet approved or cleared by the [place] FDA and has been authorized for detection and/or diagnosis of SARS-CoV-2 by FDA under an Emergency Use Authorization (EUA). This EUA will remain in effect (meaning this test can be used) for  the duration of the COVID-19 declaration under Section 564(b)(1) of the Act, 21 U.S.C. section 360bbb-3(b)(1), unless the authorization is terminated or revoked.  Performed at [facility], [address] [place], [place] [address]          Radiology Studies: No results found.      Scheduled Meds:  gabapentin  300 mg Oral Daily   And   gabapentin  600 mg Oral QHS   heparin  5,000 Units Subcutaneous Q8H   insulin aspart  0-20 Units Subcutaneous TID WC   insulin aspart  0-5 Units Subcutaneous QHS   insulin aspart  6 Units Subcutaneous TID WC   insulin detemir  20 Units Subcutaneous BID   ipratropium  2 spray Each Nare BID   LORazepam  0.5 mg Oral QHS   meclizine  25 mg Oral TID   mometasone-formoterol  2 puff Inhalation BID   nirmatrelvir/ritonavir EUA (renal dosing)  2 tablet Oral BID   propranolol ER  60 mg Oral QHS   sodium zirconium cyclosilicate  10 g Oral Once   Continuous Infusions:     LOS: 0 days    Time spent: 35 minutes    Kathie Dike, MD [facility]   If 7PM-7AM, please contact night-coverage [email or web address]  10/14/2021, 7:19 PM

## 2021-10-14 NOTE — Progress Notes (Signed)
States that when he lies down room spins.  Ortho static vitals in normal range with no hypotension or bradycardia.  Denies dizziness when standing.  Received meclizine and stated later that he believed it helped.  Contacted Dr. Roderic Palau with this information.  Continues to have hyperglycemia and was given sliding scale pulse one time [place] 10 units just now. Sitting in chair watchng TV and reading newspaper.

## 2021-10-14 NOTE — Progress Notes (Signed)
Patient VSS. No c/o pain at this time. Patient rested well during the night.

## 2021-10-15 ENCOUNTER — Observation Stay (HOSPITAL_COMMUNITY): Payer: Medicare Other

## 2021-10-15 DIAGNOSIS — M5412 Radiculopathy, cervical region: Secondary | ICD-10-CM

## 2021-10-15 DIAGNOSIS — N179 Acute kidney failure, unspecified: Secondary | ICD-10-CM | POA: Diagnosis not present

## 2021-10-15 DIAGNOSIS — K219 Gastro-esophageal reflux disease without esophagitis: Secondary | ICD-10-CM | POA: Diagnosis not present

## 2021-10-15 DIAGNOSIS — I10 Essential (primary) hypertension: Secondary | ICD-10-CM | POA: Diagnosis not present

## 2021-10-15 DIAGNOSIS — N281 Cyst of kidney, acquired: Secondary | ICD-10-CM | POA: Diagnosis not present

## 2021-10-15 DIAGNOSIS — G250 Essential tremor: Secondary | ICD-10-CM | POA: Diagnosis not present

## 2021-10-15 DIAGNOSIS — N183 Chronic kidney disease, stage 3 unspecified: Secondary | ICD-10-CM | POA: Diagnosis not present

## 2021-10-15 LAB — BASIC METABOLIC PANEL
Anion gap: 9 (ref 5–15)
BUN: 45 mg/dL — ABNORMAL HIGH (ref 8–23)
CO2: 29 mmol/L (ref 22–32)
Calcium: 9.8 mg/dL (ref 8.9–10.3)
Chloride: 99 mmol/L (ref 98–111)
Creatinine, Ser: 1.62 mg/dL — ABNORMAL HIGH (ref 0.61–1.24)
GFR, Estimated: 42 mL/min — ABNORMAL LOW (ref >60)
Glucose, Bld: 169 mg/dL — ABNORMAL HIGH (ref 70–99)
Potassium: 4.3 mmol/L (ref 3.5–5.1)
Sodium: 137 mmol/L (ref 135–145)

## 2021-10-15 LAB — GLUCOSE, CAPILLARY
Glucose-Capillary: 242 mg/dL — ABNORMAL HIGH (ref 70–99)
Glucose-Capillary: 272 mg/dL — ABNORMAL HIGH (ref 70–99)

## 2021-10-15 MED ORDER — TOUJEO SOLOSTAR 300 UNIT/ML ~~LOC~~ SOPN: 30.0000 [IU] | PEN_INJECTOR | Freq: Two times a day (BID) | SUBCUTANEOUS | Status: DC

## 2021-10-15 MED ORDER — CYCLOBENZAPRINE HCL 5 MG PO TABS: 5.0000 mg | ORAL_TABLET | Freq: Two times a day (BID) | ORAL | 0 refills | Status: DC | PRN

## 2021-10-15 MED ORDER — MECLIZINE HCL 25 MG PO TABS: 25.0000 mg | ORAL_TABLET | Freq: Three times a day (TID) | ORAL | 0 refills | Status: DC | PRN

## 2021-10-15 MED ORDER — PROPRANOLOL HCL ER 80 MG PO CP24: 80.0000 mg | ORAL_CAPSULE | Freq: Every day | ORAL | 0 refills | Status: DC

## 2021-10-15 NOTE — Progress Notes (Signed)
Pt discharged via [facility] by staff member to [place] with all personal belongings in his possession.

## 2021-10-15 NOTE — Discharge Summary (Signed)
Physician Discharge Summary  [name] VQM:[id number] DOB: 25-May-1935 DOA: 10/11/2021  PCP: Celene Squibb, MD  Admit date: 10/11/2021 Discharge date: 10/15/2021  Admitted From: home Disposition:  home  Recommendations for Outpatient Follow-up:  Follow up with PCP in 1-2 weeks Please obtain BMP/CBC in one week Follow up with primary neurologist Dr. Jaynee Eagles, consider EMG/NCS as outpatient Follow up with primary neurosurgeon, Dr. Arnoldo Morale for cervical radiculopathy  Home Health:HH PT Equipment/Devices:  Discharge Condition:stable CODE STATUS:full code Diet recommendation: heart healthy, carb modified  Brief/Interim Summary: 87 year old male with multiple medical problems, admitted to the hospital with right-sided weakness and tremors.  Initial concern for CVA.  MRI brain negative for acute infarct.  MRI C-spine indicated possible radiculopathy cause for patient's symptoms.  Seen by neurology.  Started on Medrol dose pack. Developed hyperkalemia. Also developed vertigo which has made it difficult to ambulate  Discharge Diagnoses:  Principal Problem:   Cervical radiculopathy Active Problems:   Mixed hyperlipidemia   Hypertension   GERD (gastroesophageal reflux disease)   Acute kidney injury ([place])   Essential tremor   Type 2 diabetes mellitus with chronic kidney disease, with long-term current use of insulin (HCC)  Right-sided weakness likely related to cervical radiculopathy -Initially concerns for CVA, but MRI brain negative for acute infarct -Seen by neurology and MRI C-spine ordered -Felt that his weakness likely related more to cervical radiculopathy -He was initially treated with medrol dose pack with improvement of symptoms. Further steroids held due to development of hyperglycemia -Has been following neurology, Dr. Jaynee Eagles as an outpatient and has a follow up appointment already arranged. Can be considered for EMG/NCS -He has also seen neurosurgery, Dr. Arnoldo Morale in the past  and will need to follow up for cervical radiculopathy -Seen by physical therapy with initial recommendations for skilled nursing facility -at this point, he is ambulating around his room independently -will arrange [facility] PT for veritgo    Dizziness/Vertigo possibly related to covid -orthostatics negative -difficulty ambulating -started on meclizine with improvement of symptoms -arrange HHPT   COVID-positive -No fever or respiratory symptoms -completed course of Paxlovid   Hypertension -Continue home dose of Norvasc -BP stable   Hyperlipidemia -resume statin on discharge   Acute kidney injury -Admission creatinine 1.9 -Baseline creatinine around 1.4 -Appears to be adequately hydrated -renal ultrasound negative -discharge creatinine 1.6   Diabetes, type II, uncontrolled with hyperglycemia -A1c 8.8 -Continue on basal insulin and sliding scale -Blood sugars elevated -He was taking metformin and glipizide as outpatient, but with fluctuating creatinine, I don't feel that this would be ideal -Have increased his basal insulin dosing -further titration of meds to be done as outpatient   Essential tremor -Restarted on propranolol   Hyperkalemia -Unclear etiology, question related to steroids -potassium up to 6.0 -patient received lokelma -Follow-up potassium improved to 4.3  Discharge Instructions  Discharge Instructions     Ambulatory referral to Neurology   Complete by: As directed    An appointment is requested in approximately: 2 weeks   Diet - low sodium heart healthy   Complete by: As directed    Face-to-face encounter (required for Medicare/Medicaid patients)   Complete by: As directed    I Kathie Dike certify that this patient is under my care and that I, or a nurse practitioner or physician's assistant working with me, had a face-to-face encounter that meets the physician face-to-face encounter requirements with this patient on 10/15/2021. The encounter with  the patient was in whole, or in part for  the following medical condition(s) which is the primary reason for home health care (List medical condition): vertigo   The encounter with the patient was in whole, or in part, for the following medical condition, which is the primary reason for home health care: vertigo   I certify that, based on my findings, the following services are medically necessary home health services: Physical therapy   Reason for Medically Necessary Home Health Services: Therapy- Therapeutic Exercises to Increase Strength and Endurance   My clinical findings support the need for the above services: Unsafe ambulation due to balance issues   Further, I certify that my clinical findings support that this patient is homebound due to: Unsafe ambulation due to balance issues   Home Health   Complete by: As directed    To provide the following care/treatments: PT   Increase activity slowly   Complete by: As directed       Allergies as of 10/15/2021       Reactions   Bee Venom Anaphylaxis   Adhesive [tape] Hives   Ciprofloxacin    Unknown    Codeine    Unknown    Iodine    Unknown    Latex    Unknown    Povidone-iodine    Unknown    Simvastatin    Unknown         Medication List     STOP taking these medications    cephALEXin 500 MG capsule Commonly known as: KEFLEX   glipiZIDE 10 MG tablet Commonly known as: GLUCOTROL   metFORMIN 750 MG 24 hr tablet Commonly known as: Glucophage XR   propranolol 80 MG 24 hr capsule Commonly known as: INNOPRAN XL Replaced by: propranolol ER 80 MG 24 hr capsule   temazepam 15 MG capsule Commonly known as: RESTORIL       TAKE these medications    acetaminophen 325 MG tablet Commonly known as: TYLENOL Take 650 mg by mouth every 4 (four) hours as needed for mild pain or moderate pain. Take 2 tablets 1 hour prior to Rituxan treatment.   albuterol 108 (90 Base) MCG/ACT inhaler Commonly known as: ProAir HFA Inhale  2 puffs into the lungs at bedtime as needed for wheezing or shortness of breath.   amLODipine 5 MG tablet Commonly known as: NORVASC Take 1 tablet (5 mg total) by mouth daily.   atorvastatin 40 MG tablet Commonly known as: LIPITOR Take 40 mg by mouth daily.   blood glucose meter kit and supplies Kit Dispense based on patient and insurance preference. Use at least three times a day to check BS levels and fluctuation.   budesonide-formoterol 160-4.5 MCG/ACT inhaler Commonly known as: SYMBICORT Inhale 2 puffs into the lungs 2 (two) times daily.   cyclobenzaprine 5 MG tablet Commonly known as: FLEXERIL Take 1 tablet (5 mg total) by mouth 2 (two) times daily as needed for muscle spasms.   docusate sodium 100 MG capsule Commonly known as: COLACE Take 200 mg by mouth daily as needed for mild constipation.   fluticasone 50 MCG/ACT nasal spray Commonly known as: FLONASE Place 1 spray into both nostrils as needed for allergies or rhinitis.   gabapentin 300 MG capsule Commonly known as: NEURONTIN Take 1 cap in AM and 2 caps at night (300AM, 600PM)   hydrocortisone 2.5 % cream Apply 1 application topically as needed (itching).   ipratropium 0.06 % nasal spray Commonly known as: ATROVENT Place 2 sprays into both nostrils 2 (two) times daily.  LORazepam 0.5 MG tablet Commonly known as: ATIVAN Take 0.5 mg by mouth at bedtime.   meclizine 25 MG tablet Commonly known as: ANTIVERT Take 1 tablet (25 mg total) by mouth 3 (three) times daily as needed for dizziness. What changed: when to take this   pantoprazole 40 MG tablet Commonly known as: PROTONIX TAKE 1 TABLET BY MOUTH 2 TIMES DAILY BEFORE A MEAL. What changed: See the new instructions.   propranolol ER 80 MG 24 hr capsule Commonly known as: INDERAL LA Take 1 capsule (80 mg total) by mouth at bedtime. Replaces: propranolol 80 MG 24 hr capsule   testosterone cypionate 200 MG/ML injection Commonly known as:  DEPOTESTOSTERONE CYPIONATE Inject 200 mg into the muscle every 14 (fourteen) days.   Toujeo SoloStar 300 UNIT/ML Solostar Pen Generic drug: insulin glargine (1 Unit Dial) Inject 30 Units into the skin 2 (two) times daily. What changed:  how much to take when to take this        Follow-up Information     Melvenia Beam, MD. Go on 11/20/2021.   Specialty: Neurology Contact information: [address] [place] [address] [phone]         Newman Pies, MD. Schedule an appointment as soon as possible for a visit in 1 month(s).   Specialty: Neurosurgery Contact information: [address] [place] [address] [facility].   Why: PT will call to schedule your first home visit.               Allergies  Allergen Reactions   Bee Venom Anaphylaxis   Adhesive [Tape] Hives   Ciprofloxacin     Unknown    Codeine     Unknown     Iodine     Unknown     Latex     Unknown     Povidone-Iodine     Unknown     Simvastatin     Unknown      Consultations: neurology   Procedures/Studies: DG Shoulder Right  Result Date: 10/12/2021 CLINICAL DATA:  Fall, right shoulder pain, decreased range of motion EXAM: RIGHT SHOULDER - 2+ VIEW COMPARISON:  None. FINDINGS: There is no evidence of acute fracture or dislocation. Glenohumeral and acromioclavicular alignment is maintained. There is moderate degenerative change about the [facility] joint. Scattered foci of calcification about the greater tuberosity may reflect calcific tendinitis. IMPRESSION: 1. No acute fracture or dislocation. 2. Calcifications about the greater tuberosity may reflect calcific tendinitis. 3. Moderate degenerative changes about the [facility] joint. Electronically Signed   By: Valetta Mole M.D.   On: 10/12/2021 11:50   CT Head Wo Contrast  Result Date: 10/11/2021 CLINICAL DATA:  Neuro deficit, acute, stroke suspected R sided weakness EXAM:  CT HEAD WITHOUT CONTRAST TECHNIQUE: Contiguous axial images were obtained from the base of the skull through the vertex without intravenous contrast. RADIATION DOSE REDUCTION: This exam was performed according to the departmental dose-optimization program which includes automated exposure control, adjustment of the mA and/or kV according to patient size and/or use of iterative reconstruction technique. COMPARISON:  None. BRAIN: BRAIN Cerebral ventricle sizes are concordant with the degree of cerebral volume loss. Patchy and confluent areas of decreased attenuation are noted throughout the deep and periventricular white matter of the cerebral hemispheres bilaterally, compatible with chronic microvascular ischemic disease. No evidence of large-territorial acute infarction. No parenchymal hemorrhage. No mass lesion. No extra-axial collection.  No mass effect or midline shift. No hydrocephalus. Basilar cisterns are patent. Vascular: No hyperdense vessel. Skull: No acute fracture or focal lesion. Sinuses/Orbits: Paranasal sinuses and mastoid air cells are clear. Bilateral lens replacement. Otherwise the orbits are unremarkable. Other: None. IMPRESSION: No acute intracranial abnormality. Electronically Signed   By: Iven Finn M.D.   On: 10/11/2021 19:47   CT Head Wo Contrast  Result Date: 10/09/2021 CLINICAL DATA:  Recent fall with headaches, initial encounter EXAM: CT HEAD WITHOUT CONTRAST TECHNIQUE: Contiguous axial images were obtained from the base of the skull through the vertex without intravenous contrast. RADIATION DOSE REDUCTION: This exam was performed according to the departmental dose-optimization program which includes automated exposure control, adjustment of the mA and/or kV according to patient size and/or use of iterative reconstruction technique. COMPARISON:  09/03/2021 FINDINGS: Brain: No evidence of acute infarction, hemorrhage, hydrocephalus, extra-axial collection or mass lesion/mass effect.  Mild atrophic and ischemic changes are noted stable from the prior exam. Vascular: No hyperdense vessel or unexpected calcification. Skull: Normal. Negative for fracture or focal lesion. Sinuses/Orbits: Mild mucosal changes are noted within the left maxillary antrum stable in appearance from the prior exam. Minimal fluid is noted within the right mastoid air cells. Other: None IMPRESSION: Chronic atrophic and ischemic changes. Chronic sinus changes. Electronically Signed   By: Inez Catalina M.D.   On: 10/09/2021 03:34   MR ANGIO HEAD WO CONTRAST  Result Date: 10/12/2021 CLINICAL DATA:  Stroke/TIA, determine embolic source; Neuro deficit, acute, stroke suspected; right-sided weakness EXAM: MRI HEAD WITHOUT CONTRAST MRA HEAD WITHOUT CONTRAST TECHNIQUE: Multiplanar, multi-echo pulse sequences of the brain and surrounding structures were acquired without intravenous contrast. Angiographic images of the Circle of Willis were acquired using MRA technique without intravenous contrast. COMPARISON:  MR brain 2017 FINDINGS: MRI HEAD Brain: There is no acute infarction or intracranial hemorrhage. There is no intracranial mass, mass effect, or edema. There is no hydrocephalus or extra-axial fluid collection. Prominence of the ventricles and sulci reflects parenchymal volume loss. Patchy and confluent T2 hyperintensity in the supratentorial and pontine white matter is nonspecific but probably reflects mild to moderate chronic microvascular ischemic changes. Vascular: Major vessel flow voids at the skull base are preserved. Skull and upper cervical spine: Normal marrow signal is preserved. Sinuses/Orbits: Paranasal sinuses are aerated. Bilateral lens replacements. Other: Sella is unremarkable.  Mastoid air cells are clear. MRA HEAD Intracranial internal carotid arteries are patent. Middle and anterior cerebral arteries are patent. Intracranial vertebral arteries, basilar artery, posterior cerebral arteries are patent. Right  posterior communicating artery is present with infundibular origin there is no significant stenosis or aneurysm. IMPRESSION: No evidence of recent infarction, hemorrhage, or mass. Mild to moderate chronic microvascular ischemic changes. No proximal intracranial vessel occlusion. Electronically Signed   By: Macy Mis M.D.   On: 10/12/2021 11:11   MR BRAIN WO CONTRAST  Result Date: 10/12/2021 CLINICAL DATA:  Stroke/TIA, determine embolic source; Neuro deficit, acute, stroke suspected; right-sided weakness EXAM: MRI HEAD WITHOUT CONTRAST MRA HEAD WITHOUT CONTRAST TECHNIQUE: Multiplanar, multi-echo pulse sequences of the brain and surrounding structures were acquired without intravenous contrast. Angiographic images of the Circle of Willis were acquired using MRA technique without intravenous contrast. COMPARISON:  MR brain 2017 FINDINGS: MRI HEAD Brain: There is no acute infarction or intracranial hemorrhage. There is no intracranial mass, mass effect, or edema. There is no hydrocephalus or extra-axial fluid collection. Prominence of the ventricles and sulci reflects parenchymal volume loss. Patchy and confluent T2 hyperintensity in the  supratentorial and pontine white matter is nonspecific but probably reflects mild to moderate chronic microvascular ischemic changes. Vascular: Major vessel flow voids at the skull base are preserved. Skull and upper cervical spine: Normal marrow signal is preserved. Sinuses/Orbits: Paranasal sinuses are aerated. Bilateral lens replacements. Other: Sella is unremarkable.  Mastoid air cells are clear. MRA HEAD Intracranial internal carotid arteries are patent. Middle and anterior cerebral arteries are patent. Intracranial vertebral arteries, basilar artery, posterior cerebral arteries are patent. Right posterior communicating artery is present with infundibular origin there is no significant stenosis or aneurysm. IMPRESSION: No evidence of recent infarction, hemorrhage, or mass.  Mild to moderate chronic microvascular ischemic changes. No proximal intracranial vessel occlusion. Electronically Signed   By: Macy Mis M.D.   On: 10/12/2021 11:11   MR CERVICAL SPINE WO CONTRAST  Result Date: 10/12/2021 CLINICAL DATA:  Cervical radiculopathy, no red flags. EXAM: MRI CERVICAL SPINE WITHOUT CONTRAST TECHNIQUE: Multiplanar, multisequence MR imaging of the cervical spine was performed. No intravenous contrast was administered. COMPARISON:  CT myelogram September 23, 2007. FINDINGS: The study is degraded by motion. Alignment: Small anterolisthesis at C2-3, C3-4 and C7-T1. Vertebrae: No fracture, evidence of discitis, or bone lesion. Postsurgical changes from anterior fusion at C5-C7. Cord: Normal signal and morphology. Posterior Fossa, vertebral arteries, paraspinal tissues: T2 hyperintensity within the pons, nonspecific, most likely related to chronic small vessel disease, better seen on recent MRI of the brain. Left atlantooccipital joint effusion. Disc levels: C2-3: Posterior disc protrusion and thickening of the ligamentum flavum resulting in mild spinal canal stenosis. Uncovertebral and facet degenerative changes resulting mild bilateral neural foraminal narrowing. C3-4: Posterior disc osteophyte complex resulting in mild spinal canal stenosis. Uncovertebral and facet degenerative changes resulting in severe right and moderate left neural foraminal narrowing. C4-5: Posterior disc osteophyte complex without significant spinal canal stenosis. Uncovertebral and facet degenerative changes resulting in moderate bilateral neural foraminal narrowing. C5-6: Uncovertebral and facet degenerative changes resulting in moderate right and mild left neural foraminal narrowing. No spinal canal stenosis. C6-7: No spinal canal stenosis. Uncovertebral and facet degenerative changes resulting moderate right and severe left neural foraminal narrowing. C7-T1: Disc bulge/disc uncovering without significant  spinal canal stenosis. Hypertrophic facet degenerative changes without significant neural foraminal narrowing. IMPRESSION: 1. Status post C5-C7 fusion. 2. Degenerative changes of the cervical spine with multilevel neural foraminal narrowing, as described above. 3. Mild spinal canal stenosis at C5-2 3 and C3-4. Electronically Signed   By: Pedro Earls M.D.   On: 10/12/2021 12:17   US RENAL  Result Date: 10/15/2021 CLINICAL DATA:  Acute kidney injury, diabetes mellitus, prostate cancer post prostatectomy, stage III chronic kidney disease EXAM: RENAL / URINARY TRACT ULTRASOUND COMPLETE COMPARISON:  08/28/2021 FINDINGS: Right Kidney: Renal measurements: 10.4 x 5.1 x 6.0 cm = volume: 164 mL. Normal cortical thickness and echogenicity. Several small cysts are identified, fewer than 10, largest measuring 19 mm and 10 mm in greatest sizes. No solid mass, hydronephrosis, or shadowing calcification Left Kidney: Renal measurements: 10.4 x 5.0 x 5.1 cm = volume: 138 mL. Cortical thinning. Upper normal cortical echogenicity. No solid mass or hydronephrosis. Multiple small cysts, more than 10, largest 2 measuring 4.7 cm at inferior pole and 3.8 cm at upper pole medially. No shadowing calculi. Bladder: Appears normal for degree of bladder distention. LEFT ureteral jet visualized. Other: N/A IMPRESSION: BILATERAL renal cysts without solid renal mass or hydronephrosis. Remainder of exam unremarkable. Electronically Signed   By: Lavonia Dana M.D.   On:  10/15/2021 10:46   US Carotid Bilateral (at [facility] and [facility] only)  Result Date: 10/12/2021 CLINICAL DATA:  Stroke, hyperlipidemia, diabetes EXAM: BILATERAL CAROTID DUPLEX ULTRASOUND TECHNIQUE: Pearline Cables scale imaging, color Doppler and duplex ultrasound were performed of bilateral carotid and vertebral arteries in the neck. COMPARISON:  None. FINDINGS: Criteria: Quantification of carotid stenosis is based on velocity parameters that correlate the residual internal  carotid diameter with NASCET-based stenosis levels, using the diameter of the distal internal carotid lumen as the denominator for stenosis measurement. The following velocity measurements were obtained: RIGHT ICA: 60/10 cm/sec CCA: 26/9 cm/sec SYSTOLIC ICA/CCA RATIO:  1.0 ECA: 79 cm/sec LEFT ICA: 108/29 cm/sec CCA: 48/54 cm/sec SYSTOLIC ICA/CCA RATIO:  1.6 ECA: 87 cm/sec RIGHT CAROTID ARTERY: Mild tortuosity. Mild intimal thickening in the common carotid artery and bulb. Eccentric partially calcified plaque in the proximal ICA resulting in only mild stenosis. Normal waveforms and color Doppler signal throughout. RIGHT VERTEBRAL ARTERY:  Normal flow direction and waveform. LEFT CAROTID ARTERY: Intimal thickening in the common carotid artery. Mild eccentric noncalcified smooth nonocclusive plaque in the bulb and ECA origin. Normal waveforms and color Doppler signal throughout. LEFT VERTEBRAL ARTERY:  Normal flow direction and waveform. IMPRESSION: 1. Bilateral carotid bifurcation plaque resulting in less than 50% diameter ICA stenosis. 2. Antegrade bilateral vertebral arterial flow. Electronically Signed   By: Lucrezia Europe M.D.   On: 10/12/2021 13:57   DG Chest Portable 1 View  Result Date: 10/09/2021 CLINICAL DATA:  Status post fall with recently diagnosed UTI. EXAM: PORTABLE CHEST 1 VIEW COMPARISON:  September 03, 2021 FINDINGS: Low lung volumes are seen with mild, stable bibasilar atelectatic changes. There is no evidence of a pleural effusion or pneumothorax. The heart size and mediastinal contours are within normal limits. A radiopaque fusion plate and screws are seen overlying the cervical spine. The visualized skeletal structures are unremarkable. IMPRESSION: Low lung volumes with mild, stable bibasilar atelectatic changes. Electronically Signed   By: Virgina Norfolk M.D.   On: 10/09/2021 03:55   ECHOCARDIOGRAM COMPLETE  Result Date: 10/12/2021    ECHOCARDIOGRAM REPORT   Patient Name:   MOHAN ERVEN Date of Exam: 10/12/2021 Medical Rec #:  [id number]         Height:       71.0 in Accession #:    [id number]        Weight:       209.0 lb Date of Birth:  Oct 12, 1934        BSA:          2.148 m Patient Age:    11 years          BP:           153/96 mmHg Patient Gender: M                 HR:           56 bpm. Exam Location:  [facility] Procedure: 2D Echo, Cardiac Doppler and Color Doppler Indications:    Stroke  History:        Patient has prior history of Echocardiogram examinations, most                 recent 01/11/2010. CAD, Stroke, Signs/Symptoms:Shortness of                 Breath; Risk Factors:Hypertension, Diabetes and Dyslipidemia.  Sonographer:    Wenda Low Referring Phys: [id number] ASIA B [place]  1. Left ventricular ejection fraction,  by estimation, is 50 to 55%. Left ventricular ejection fraction by 2D MOD biplane is 53.1 %. The left ventricle has low normal function. The left ventricle has no regional wall motion abnormalities. There is moderate concentric left ventricular hypertrophy. Left ventricular diastolic parameters are consistent with Grade I diastolic dysfunction (impaired relaxation).  2. Right ventricular systolic function is normal. The right ventricular size is normal. Tricuspid regurgitation signal is inadequate for assessing PA pressure.  3. The mitral valve is grossly normal. Trivial mitral valve regurgitation. No evidence of mitral stenosis.  4. The aortic valve is tricuspid. Aortic valve regurgitation is mild. No aortic stenosis is present.  5. The inferior vena cava is normal in size with greater than 50% respiratory variability, suggesting right atrial pressure of 3 mmHg. Conclusion(s)/Recommendation(s): No intracardiac source of embolism detected on this transthoracic study. Consider a transesophageal echocardiogram to exclude cardiac source of embolism if clinically indicated. FINDINGS  Left Ventricle: Left ventricular ejection fraction, by estimation, is  50 to 55%. Left ventricular ejection fraction by 2D MOD biplane is 53.1 %. The left ventricle has low normal function. The left ventricle has no regional wall motion abnormalities. The left ventricular internal cavity size was normal in size. There is moderate concentric left ventricular hypertrophy. Left ventricular diastolic parameters are consistent with Grade I diastolic dysfunction (impaired relaxation). Right Ventricle: The right ventricular size is normal. No increase in right ventricular wall thickness. Right ventricular systolic function is normal. Tricuspid regurgitation signal is inadequate for assessing PA pressure. Left Atrium: Left atrial size was normal in size. Right Atrium: Right atrial size was normal in size. Pericardium: There is no evidence of pericardial effusion. Mitral Valve: The mitral valve is grossly normal. Trivial mitral valve regurgitation. No evidence of mitral valve stenosis. MV peak gradient, 3.0 mmHg. The mean mitral valve gradient is 1.0 mmHg. Tricuspid Valve: The tricuspid valve is grossly normal. Tricuspid valve regurgitation is trivial. No evidence of tricuspid stenosis. Aortic Valve: The aortic valve is tricuspid. Aortic valve regurgitation is mild. No aortic stenosis is present. Aortic valve mean gradient measures 2.0 mmHg. Aortic valve peak gradient measures 2.7 mmHg. Aortic valve area, by VTI measures 3.50 cm. Pulmonic Valve: The pulmonic valve was grossly normal. Pulmonic valve regurgitation is not visualized. No evidence of pulmonic stenosis. Aorta: The aortic root and ascending aorta are structurally normal, with no evidence of dilitation. Venous: The inferior vena cava is normal in size with greater than 50% respiratory variability, suggesting right atrial pressure of 3 mmHg. IAS/Shunts: The atrial septum is grossly normal.  LEFT VENTRICLE PLAX 2D                        Biplane EF (MOD) LVIDd:         4.70 cm         LV Biplane EF:   Left LVIDs:         3.10 cm                           ventricular LV PW:         1.40 cm                          ejection LV IVS:        1.40 cm  fraction by LVOT diam:     2.20 cm                          2D MOD LV SV:         71                               biplane is LV SV Index:   33                               53.1 %. LVOT Area:     3.80 cm                                Diastology                                LV e' medial:    5.00 cm/s LV Volumes (MOD)               LV E/e' medial:  12.7 LV vol d, MOD    48.6 ml       LV e' lateral:   12.00 cm/s A2C:                           LV E/e' lateral: 5.3 LV vol d, MOD    47.3 ml A4C: LV vol s, MOD    22.7 ml A2C: LV vol s, MOD    22.6 ml A4C: LV SV MOD A2C:   25.9 ml LV SV MOD A4C:   47.3 ml LV SV MOD BP:    25.7 ml RIGHT VENTRICLE RV Basal diam:  3.50 cm RV Mid diam:    2.50 cm RV S prime:     12.20 cm/s TAPSE (M-mode): 2.3 cm LEFT ATRIUM             Index        RIGHT ATRIUM           Index LA diam:        3.80 cm 1.77 cm/m   RA Area:     12.40 cm LA Vol (A2C):   45.0 ml 20.95 ml/m  RA Volume:   22.40 ml  10.43 ml/m LA Vol (A4C):   40.5 ml 18.85 ml/m LA Biplane Vol: 46.1 ml 21.46 ml/m  AORTIC VALVE                    PULMONIC VALVE AV Area (Vmax):    3.77 cm     PV Vmax:       0.78 m/s AV Area (Vmean):   3.25 cm     PV Peak grad:  2.5 mmHg AV Area (VTI):     3.50 cm AV Vmax:           82.30 cm/s AV Vmean:          60.400 cm/s AV VTI:            0.204 m AV Peak Grad:      2.7 mmHg AV Mean Grad:      2.0 mmHg LVOT Vmax:         81.60 cm/s LVOT Vmean:  51.600 cm/s LVOT VTI:          0.188 m LVOT/AV VTI ratio: 0.92  AORTA Ao Root diam: 3.30 cm Ao Asc diam:  3.30 cm MITRAL VALVE MV Area (PHT): 2.87 cm    SHUNTS MV Area VTI:   2.46 cm    Systemic VTI:  0.19 m MV Peak grad:  3.0 mmHg    Systemic Diam: 2.20 cm MV Mean grad:  1.0 mmHg MV Vmax:       0.87 m/s MV Vmean:      41.9 cm/s MV Decel Time: 264 msec MV E velocity: 63.40 cm/s MV A velocity: 81.40 cm/s MV E/A  ratio:  0.78 Eleonore Chiquito MD Electronically signed by Eleonore Chiquito MD Signature Date/Time: 10/12/2021/3:26:15 PM    Final       Subjective: Dizziness is better today. Able to ambulate  Discharge Exam: Vitals:   10/14/21 2028 10/14/21 2054 10/15/21 0519 10/15/21 0700  BP:  125/79 (!) 151/99   Pulse:  71 (!) 57   Resp:  20 18   Temp:  97.9 F (36.6 C) 97.7 F (36.5 C)   TempSrc:  Oral Oral   SpO2: 93% 93% 96% 97%  Weight:      Height:        General: Pt is alert, awake, not in acute distress Cardiovascular: RRR, S1/S2 +, no rubs, no gallops Respiratory: CTA bilaterally, no wheezing, no rhonchi Abdominal: Soft, NT, ND, bowel sounds + Extremities: no edema, no cyanosis    The results of significant diagnostics from this hospitalization (including imaging, microbiology, ancillary and laboratory) are listed below for reference.     Microbiology: Recent Results (from the past 240 hour(s))  Urine Culture     Status: Abnormal   Collection Time: 10/09/21  3:11 AM   Specimen: Urine, Clean Catch  Result Value Ref Range Status   Specimen Description   Final    URINE, CLEAN CATCH Performed at [facility], [address] [place], [place] [address]    Special Requests   Final    NONE Performed at [facility], [address], [place], [place] [address]    Culture (A)  Final    30,000 COLONIES/mL PSEUDOMONAS AERUGINOSA 30,000 COLONIES/mL ESCHERICHIA COLI    Report Status 10/12/2021 FINAL  Final   Organism ID, Bacteria PSEUDOMONAS AERUGINOSA (A)  Final   Organism ID, Bacteria ESCHERICHIA COLI (A)  Final      Susceptibility   Escherichia coli - MIC*    AMPICILLIN >=32 RESISTANT Resistant     CEFAZOLIN <=4 SENSITIVE Sensitive     CEFEPIME <=0.12 SENSITIVE Sensitive     CEFTRIAXONE <=0.25 SENSITIVE Sensitive     CIPROFLOXACIN <=0.25 SENSITIVE Sensitive     GENTAMICIN <=1 SENSITIVE Sensitive     IMIPENEM <=0.25 SENSITIVE Sensitive     NITROFURANTOIN <=16 SENSITIVE Sensitive      TRIMETH/SULFA <=20 SENSITIVE Sensitive     AMPICILLIN/SULBACTAM 16 INTERMEDIATE Intermediate     PIP/TAZO <=4 SENSITIVE Sensitive     * 30,000 COLONIES/mL ESCHERICHIA COLI   Pseudomonas aeruginosa - MIC*    CEFTAZIDIME 8 SENSITIVE Sensitive     CIPROFLOXACIN <=0.25 SENSITIVE Sensitive     GENTAMICIN <=1 SENSITIVE Sensitive     IMIPENEM 1 SENSITIVE Sensitive     * 30,000 COLONIES/mL PSEUDOMONAS AERUGINOSA  Resp Panel by RT-PCR (Flu A&B, Covid) Nasopharyngeal Swab     Status: Abnormal   Collection Time: 10/11/21  7:12 PM   Specimen: Nasopharyngeal Swab; Nasopharyngeal(NP) swabs  in vial transport medium  Result Value Ref Range Status   SARS Coronavirus 2 by RT PCR POSITIVE (A) NEGATIVE Final    Comment: (NOTE) SARS-CoV-2 target nucleic acids are DETECTED.  The SARS-CoV-2 RNA is generally detectable in upper respiratory specimens during the acute phase of infection. Positive results are indicative of the presence of the identified virus, but do not rule out bacterial infection or co-infection with other pathogens not detected by the test. Clinical correlation with patient history and other diagnostic information is necessary to determine patient infection status. The expected result is Negative.  Fact Sheet for Patients: EntrepreneurPulse.com.au  Fact Sheet for Healthcare Providers: IncredibleEmployment.be  This test is not yet approved or cleared by the [place] FDA and  has been authorized for detection and/or diagnosis of SARS-CoV-2 by FDA under an Emergency Use Authorization (EUA).  This EUA will remain in effect (meaning this test can be used) for the duration of  the COVID-19 declaration under Section 564(b)(1) of the A ct, 21 U.S.C. section 360bbb-3(b)(1), unless the authorization is terminated or revoked sooner.     Influenza A by PCR NEGATIVE NEGATIVE Final   Influenza B by PCR NEGATIVE NEGATIVE Final    Comment: (NOTE) The  Xpert Xpress SARS-CoV-2/FLU/RSV plus assay is intended as an aid in the diagnosis of influenza from Nasopharyngeal swab specimens and should not be used as a sole basis for treatment. Nasal washings and aspirates are unacceptable for Xpert Xpress SARS-CoV-2/FLU/RSV testing.  Fact Sheet for Patients: EntrepreneurPulse.com.au  Fact Sheet for Healthcare Providers: IncredibleEmployment.be  This test is not yet approved or cleared by the [place] FDA and has been authorized for detection and/or diagnosis of SARS-CoV-2 by FDA under an Emergency Use Authorization (EUA). This EUA will remain in effect (meaning this test can be used) for the duration of the COVID-19 declaration under Section 564(b)(1) of the Act, 21 U.S.C. section 360bbb-3(b)(1), unless the authorization is terminated or revoked.  Performed at [facility], [address] [place], [place] [address]      Labs: BNP (last 3 results) Recent Labs    06/04/21 2013 08/28/21 0430  BNP 250.0* 706.2*   Basic Metabolic Panel: Recent Labs  Lab 10/11/21 1900 10/12/21 0436 10/13/21 0405 10/13/21 1616 10/14/21 1634 10/15/21 0413  NA 134* 136 134*  --  134* 137  K 4.7 4.6 6.0* 5.2* 5.3* 4.3  CL 98 99 99  --  99 99  CO2 _0 --  27 29  GLUCOSE 272* 294* 268*  --  151* 169*  BUN 23 23 30*  --  46* 45*  CREATININE 1.93* 1.74* 1.82*  --  1.76* 1.62*  CALCIUM 9.0 9.1 9.7  --  9.4 9.8  MG 1.4*  --   --   --   --   --    Liver Function Tests: Recent Labs  Lab 10/09/21 0308 10/11/21 1900 10/12/21 0436 10/14/21 1634  AST 20 21 13* 14*  ALT _1 ALKPHOS 68 61 56 60  BILITOT 0.7 0.9 0.6 0.4  PROT 6.5 6.9 6.0* 6.6  ALBUMIN 4.1 4.2 3.7 4.2   No results for input(s): LIPASE, AMYLASE in the last 168 hours. No results for input(s): AMMONIA in the last 168 hours. CBC: Recent Labs  Lab 10/09/21 0308 10/11/21 1900 10/12/21 0436 10/14/21 1634  WBC 7.6 6.3 5.7 12.8*   NEUTROABS 6.0 4.9  --   --   HGB 13.4 14.8 13.5 14.2  HCT  40.2 44.3 42.2 42.8  MCV 94.1 94.3 93.8 93.9  PLT 113* 125* 129* 147*   Cardiac Enzymes: No results for input(s): CKTOTAL, CKMB, CKMBINDEX, TROPONINI in the last 168 hours. BNP: Invalid input(s): POCBNP CBG: Recent Labs  Lab 10/14/21 1122 10/14/21 1621 10/14/21 2045 10/15/21 0718 10/15/21 1107  GLUCAP 376* 164* 172* 242* 272*   D-Dimer No results for input(s): DDIMER in the last 72 hours. Hgb A1c No results for input(s): HGBA1C in the last 72 hours. Lipid Profile No results for input(s): CHOL, HDL, LDLCALC, TRIG, CHOLHDL, LDLDIRECT in the last 72 hours. Thyroid function studies No results for input(s): TSH, T4TOTAL, T3FREE, THYROIDAB in the last 72 hours.  Invalid input(s): FREET3 Anemia work up No results for input(s): VITAMINB12, FOLATE, FERRITIN, TIBC, IRON, RETICCTPCT in the last 72 hours. Urinalysis    Component Value Date/Time   COLORURINE YELLOW 10/11/2021 1947   APPEARANCEUR CLEAR 10/11/2021 1947   LABSPEC 1.013 10/11/2021 1947   PHURINE 5.0 10/11/2021 1947   GLUCOSEU 150 (A) 10/11/2021 [place] NEGATIVE 10/11/2021 [place] NEGATIVE 10/11/2021 1947   BILIRUBINUR negative 05/04/2021 [place] 10/11/2021 1947   PROTEINUR NEGATIVE 10/11/2021 1947   UROBILINOGEN 1.0 05/04/2021 1702   UROBILINOGEN 0.2 06/30/2015 1552   NITRITE NEGATIVE 10/11/2021 1947   LEUKOCYTESUR TRACE (A) 10/11/2021 1947   Sepsis Labs Invalid input(s): PROCALCITONIN,  WBC,  LACTICIDVEN Microbiology Recent Results (from the past 240 hour(s))  Urine Culture     Status: Abnormal   Collection Time: 10/09/21  3:11 AM   Specimen: Urine, Clean Catch  Result Value Ref Range Status   Specimen Description   Final    URINE, CLEAN CATCH Performed at [facility], [address], [place], [place] [address]    Special Requests   Final    NONE Performed at [facility], [address] [place], [place]  [address]    Culture (A)  Final    30,000 COLONIES/mL PSEUDOMONAS AERUGINOSA 30,000 COLONIES/mL ESCHERICHIA COLI    Report Status 10/12/2021 FINAL  Final   Organism ID, Bacteria PSEUDOMONAS AERUGINOSA (A)  Final   Organism ID, Bacteria ESCHERICHIA COLI (A)  Final      Susceptibility   Escherichia coli - MIC*    AMPICILLIN >=32 RESISTANT Resistant     CEFAZOLIN <=4 SENSITIVE Sensitive     CEFEPIME <=0.12 SENSITIVE Sensitive     CEFTRIAXONE <=0.25 SENSITIVE Sensitive     CIPROFLOXACIN <=0.25 SENSITIVE Sensitive     GENTAMICIN <=1 SENSITIVE Sensitive     IMIPENEM <=0.25 SENSITIVE Sensitive     NITROFURANTOIN <=16 SENSITIVE Sensitive     TRIMETH/SULFA <=20 SENSITIVE Sensitive     AMPICILLIN/SULBACTAM 16 INTERMEDIATE Intermediate     PIP/TAZO <=4 SENSITIVE Sensitive     * 30,000 COLONIES/mL ESCHERICHIA COLI   Pseudomonas aeruginosa - MIC*    CEFTAZIDIME 8 SENSITIVE Sensitive     CIPROFLOXACIN <=0.25 SENSITIVE Sensitive     GENTAMICIN <=1 SENSITIVE Sensitive     IMIPENEM 1 SENSITIVE Sensitive     * 30,000 COLONIES/mL PSEUDOMONAS AERUGINOSA  Resp Panel by RT-PCR (Flu A&B, Covid) Nasopharyngeal Swab     Status: Abnormal   Collection Time: 10/11/21  7:12 PM   Specimen: Nasopharyngeal Swab; Nasopharyngeal(NP) swabs in vial transport medium  Result Value Ref Range Status   SARS Coronavirus 2 by RT PCR POSITIVE (A) NEGATIVE Final    Comment: (NOTE) SARS-CoV-2 target nucleic acids are DETECTED.  The SARS-CoV-2 RNA is generally detectable  in upper respiratory specimens during the acute phase of infection. Positive results are indicative of the presence of the identified virus, but do not rule out bacterial infection or co-infection with other pathogens not detected by the test. Clinical correlation with patient history and other diagnostic information is necessary to determine patient infection status. The expected result is Negative.  Fact Sheet for  Patients: EntrepreneurPulse.com.au  Fact Sheet for Healthcare Providers: IncredibleEmployment.be  This test is not yet approved or cleared by the [place] FDA and  has been authorized for detection and/or diagnosis of SARS-CoV-2 by FDA under an Emergency Use Authorization (EUA).  This EUA will remain in effect (meaning this test can be used) for the duration of  the COVID-19 declaration under Section 564(b)(1) of the A ct, 21 U.S.C. section 360bbb-3(b)(1), unless the authorization is terminated or revoked sooner.     Influenza A by PCR NEGATIVE NEGATIVE Final   Influenza B by PCR NEGATIVE NEGATIVE Final    Comment: (NOTE) The Xpert Xpress SARS-CoV-2/FLU/RSV plus assay is intended as an aid in the diagnosis of influenza from Nasopharyngeal swab specimens and should not be used as a sole basis for treatment. Nasal washings and aspirates are unacceptable for Xpert Xpress SARS-CoV-2/FLU/RSV testing.  Fact Sheet for Patients: EntrepreneurPulse.com.au  Fact Sheet for Healthcare Providers: IncredibleEmployment.be  This test is not yet approved or cleared by the [place] FDA and has been authorized for detection and/or diagnosis of SARS-CoV-2 by FDA under an Emergency Use Authorization (EUA). This EUA will remain in effect (meaning this test can be used) for the duration of the COVID-19 declaration under Section 564(b)(1) of the Act, 21 U.S.C. section 360bbb-3(b)(1), unless the authorization is terminated or revoked.  Performed at [facility], [address] [place], [place] [address]      Time coordinating discharge: 41mns  SIGNED:   JKathie Dike MD  [facility] 10/15/2021, 11:59 AM   If 7PM-7AM, please contact night-coverage [email or web address]

## 2021-10-15 NOTE — Progress Notes (Signed)
Nsg Discharge Note  Admit Date:  10/11/2021 Discharge date: 10/15/2021   BUCKY GRIGG to be D/C'd Home per MD order.  AVS completed.  Copy for chart, and copy for patient signed, and dated. Patient/caregiver able to verbalize understanding.  Discharge Medication: Allergies as of 10/15/2021       Reactions   Bee Venom Anaphylaxis   Adhesive [tape] Hives   Ciprofloxacin    Unknown    Codeine    Unknown    Iodine    Unknown    Latex    Unknown    Povidone-iodine    Unknown    Simvastatin    Unknown         Medication List     STOP taking these medications    cephALEXin 500 MG capsule Commonly known as: KEFLEX   glipiZIDE 10 MG tablet Commonly known as: GLUCOTROL   metFORMIN 750 MG 24 hr tablet Commonly known as: Glucophage XR   propranolol 80 MG 24 hr capsule Commonly known as: INNOPRAN XL Replaced by: propranolol ER 80 MG 24 hr capsule   temazepam 15 MG capsule Commonly known as: RESTORIL       TAKE these medications    acetaminophen 325 MG tablet Commonly known as: TYLENOL Take 650 mg by mouth every 4 (four) hours as needed for mild pain or moderate pain. Take 2 tablets 1 hour prior to Rituxan treatment.   albuterol 108 (90 Base) MCG/ACT inhaler Commonly known as: ProAir HFA Inhale 2 puffs into the lungs at bedtime as needed for wheezing or shortness of breath.   amLODipine 5 MG tablet Commonly known as: NORVASC Take 1 tablet (5 mg total) by mouth daily.   atorvastatin 40 MG tablet Commonly known as: LIPITOR Take 40 mg by mouth daily.   blood glucose meter kit and supplies Kit Dispense based on patient and insurance preference. Use at least three times a day to check BS levels and fluctuation.   budesonide-formoterol 160-4.5 MCG/ACT inhaler Commonly known as: SYMBICORT Inhale 2 puffs into the lungs 2 (two) times daily.   cyclobenzaprine 5 MG tablet Commonly known as: FLEXERIL Take 1 tablet (5 mg total) by mouth 2 (two) times daily as  needed for muscle spasms.   docusate sodium 100 MG capsule Commonly known as: COLACE Take 200 mg by mouth daily as needed for mild constipation.   fluticasone 50 MCG/ACT nasal spray Commonly known as: FLONASE Place 1 spray into both nostrils as needed for allergies or rhinitis.   gabapentin 300 MG capsule Commonly known as: NEURONTIN Take 1 cap in AM and 2 caps at night (300AM, 600PM)   hydrocortisone 2.5 % cream Apply 1 application topically as needed (itching).   ipratropium 0.06 % nasal spray Commonly known as: ATROVENT Place 2 sprays into both nostrils 2 (two) times daily.   LORazepam 0.5 MG tablet Commonly known as: ATIVAN Take 0.5 mg by mouth at bedtime.   meclizine 25 MG tablet Commonly known as: ANTIVERT Take 1 tablet (25 mg total) by mouth 3 (three) times daily as needed for dizziness. What changed: when to take this   pantoprazole 40 MG tablet Commonly known as: PROTONIX TAKE 1 TABLET BY MOUTH 2 TIMES DAILY BEFORE A MEAL. What changed: See the new instructions.   propranolol ER 80 MG 24 hr capsule Commonly known as: INDERAL LA Take 1 capsule (80 mg total) by mouth at bedtime. Replaces: propranolol 80 MG 24 hr capsule   testosterone cypionate 200 MG/ML injection Commonly  known as: DEPOTESTOSTERONE CYPIONATE Inject 200 mg into the muscle every 14 (fourteen) days.   Toujeo SoloStar 300 UNIT/ML Solostar Pen Generic drug: insulin glargine (1 Unit Dial) Inject 30 Units into the skin 2 (two) times daily. What changed:  how much to take when to take this        Discharge Assessment: Vitals:   10/15/21 0519 10/15/21 0700  BP: (!) 151/99   Pulse: (!) 57   Resp: 18   Temp: 97.7 F (36.5 C)   SpO2: 96% 97%   Skin clean, dry and intact without evidence of skin break down, no evidence of skin tears noted. IV catheter discontinued intact. Site without signs and symptoms of complications - no redness or edema noted at insertion site, patient denies c/o pain  - only slight tenderness at site.  Dressing with slight pressure applied.  D/c Instructions-Education: Discharge instructions given to patient/family with verbalized understanding. D/c education completed with patient/family including follow up instructions, medication list, d/c activities limitations if indicated, with other d/c instructions as indicated by MD - patient able to verbalize understanding, all questions fully answered. Patient instructed to return to ED, call 911, or call MD for any changes in condition.  Patient escorted via [place], and D/C home via private auto.  Clovis Fredrickson, LPN [id number] 28:36 PM

## 2021-10-15 NOTE — Care Management Obs Status (Signed)
[place] NOTIFICATION   Patient Details  Name: [name] MRN: [id number] Date of Birth: 30-May-1935   Medicare Observation Status Notification Given:  Yes (done by [facility], LCSW on 10/13/21)    Tommy Medal 10/15/2021, 9:14 AM

## 2021-10-15 NOTE — Progress Notes (Signed)
Pts wife will come pick pt up. Discharge paper work given to pt and explained no following questions from pt.

## 2021-10-15 NOTE — TOC Transition Note (Signed)
Transition of Care [facility]) - CM/SW Discharge Note   Patient Details  Name: [name] MRN: [id number] Date of Birth: July 07, 1935  [facility] CM/SW Contact:  Boneta Lucks, RN Phone Number: 10/15/2021, 12:11 PM   Clinical Narrative:   Patient is discharging home with home health PT. Lattie Haw with [facility] updated. Orders have been placed.   Final next level of care: [place] Barriers to Discharge: Barriers Resolved   Patient Goals and CMS Choice Patient states their goals for this hospitalization and ongoing recovery are:: to go home. CMS Medicare.gov Compare Post Acute Care list provided to:: Patient Choice offered to / list presented to : Patient  Discharge Placement               Patient to be transferred to facility by: wife   Patient and family notified of of transfer: 10/15/21  Discharge Plan and Services       Date [place]: 10/12/21 Time [place]: [place] Representative spoke with at [place]: Penni Homans  Readmission Risk Interventions Readmission Risk Prevention Plan 08/30/2021 06/12/2021 06/12/2021  Transportation Screening Complete Complete -  PCP or Specialist Appt within 3-5 Days Complete - Complete  HRI or Home Care Consult Complete - Complete  Social Work Consult for [place] Planning/Counseling Complete - Complete  Palliative Care Screening Not Applicable - Not Applicable  Medication Review Press photographer) Complete - Complete  Some recent data might be hidden

## 2021-10-17 ENCOUNTER — Encounter: Payer: Self-pay | Admitting: Physician Assistant

## 2021-10-17 DIAGNOSIS — G250 Essential tremor: Secondary | ICD-10-CM | POA: Diagnosis not present

## 2021-10-17 DIAGNOSIS — I6529 Occlusion and stenosis of unspecified carotid artery: Secondary | ICD-10-CM | POA: Insufficient documentation

## 2021-10-17 DIAGNOSIS — R339 Retention of urine, unspecified: Secondary | ICD-10-CM | POA: Diagnosis not present

## 2021-10-17 DIAGNOSIS — I2510 Atherosclerotic heart disease of native coronary artery without angina pectoris: Secondary | ICD-10-CM | POA: Diagnosis not present

## 2021-10-17 DIAGNOSIS — N10 Acute tubulo-interstitial nephritis: Secondary | ICD-10-CM | POA: Diagnosis not present

## 2021-10-17 DIAGNOSIS — E1142 Type 2 diabetes mellitus with diabetic polyneuropathy: Secondary | ICD-10-CM | POA: Diagnosis not present

## 2021-10-17 DIAGNOSIS — N179 Acute kidney failure, unspecified: Secondary | ICD-10-CM | POA: Diagnosis not present

## 2021-10-17 DIAGNOSIS — G4733 Obstructive sleep apnea (adult) (pediatric): Secondary | ICD-10-CM | POA: Diagnosis not present

## 2021-10-17 DIAGNOSIS — E1122 Type 2 diabetes mellitus with diabetic chronic kidney disease: Secondary | ICD-10-CM | POA: Diagnosis not present

## 2021-10-17 DIAGNOSIS — R918 Other nonspecific abnormal finding of lung field: Secondary | ICD-10-CM | POA: Diagnosis not present

## 2021-10-17 DIAGNOSIS — N1330 Unspecified hydronephrosis: Secondary | ICD-10-CM | POA: Diagnosis not present

## 2021-10-17 DIAGNOSIS — K219 Gastro-esophageal reflux disease without esophagitis: Secondary | ICD-10-CM | POA: Diagnosis not present

## 2021-10-17 DIAGNOSIS — I129 Hypertensive chronic kidney disease with stage 1 through stage 4 chronic kidney disease, or unspecified chronic kidney disease: Secondary | ICD-10-CM | POA: Diagnosis not present

## 2021-10-17 DIAGNOSIS — C8310 Mantle cell lymphoma, unspecified site: Secondary | ICD-10-CM | POA: Diagnosis not present

## 2021-10-17 DIAGNOSIS — N1832 Chronic kidney disease, stage 3b: Secondary | ICD-10-CM | POA: Diagnosis not present

## 2021-10-17 DIAGNOSIS — J45909 Unspecified asthma, uncomplicated: Secondary | ICD-10-CM | POA: Diagnosis not present

## 2021-10-17 DIAGNOSIS — Z466 Encounter for fitting and adjustment of urinary device: Secondary | ICD-10-CM | POA: Diagnosis not present

## 2021-10-17 DIAGNOSIS — G4700 Insomnia, unspecified: Secondary | ICD-10-CM | POA: Diagnosis not present

## 2021-10-17 DIAGNOSIS — E1151 Type 2 diabetes mellitus with diabetic peripheral angiopathy without gangrene: Secondary | ICD-10-CM | POA: Diagnosis not present

## 2021-10-17 DIAGNOSIS — E1165 Type 2 diabetes mellitus with hyperglycemia: Secondary | ICD-10-CM | POA: Diagnosis not present

## 2021-10-17 DIAGNOSIS — G9341 Metabolic encephalopathy: Secondary | ICD-10-CM | POA: Diagnosis not present

## 2021-10-17 DIAGNOSIS — D696 Thrombocytopenia, unspecified: Secondary | ICD-10-CM | POA: Diagnosis not present

## 2021-10-17 HISTORY — DX: Occlusion and stenosis of unspecified carotid artery: I65.29

## 2021-10-17 NOTE — Progress Notes (Signed)
Cardiology Office Note:    Date:  10/22/2021   ID:  [name], DOB 10/13/1934, MRN [id number]  PCP:  Celene Squibb, MD  [facility] Providers Cardiologist:  Freada Bergeron, MD Cardiology APP:  Sharmon Revere    Referring MD: Celene Squibb, MD   Chief Complaint:  F/u for CAD    Patient Profile: Coronary artery disease  Mild non-obs dz by cath in 2008 Hypertension  Hyperlipidemia  Peripheral arterial disease  Chronic kidney disease Diabetes mellitus  Aortic atherosclerosis  OSA Colon CA GERD Prostate CA  Prior CV Studies: Echo 10/12/2021: EF 50-55, no RWMA, moderate LVH, G1 DD, normal RVSF, trivial MR, mild AI  Carotid US 10/12/2021: Bilateral carotid bifurcation plaque, <50% ICA stenosis  LHC 12/01/2009: EF normal //LAD mid myocardial bridge // LCx intermediate branch ostial 40  Cardiac catheterization 12/31/2006: LM patent //LAD mid plaque versus myocardial bridge (50%)   History of Present Illness:   [name] is a 87 y.o. male with the above problem list.  He is a prior pt of Dr. Lia Foyer.  Over the last several years, he has been seen by Truitt Merle, NP.  He was last seen in 12/21.  He has had several admissions this past year with urosepsis.  He was recently admitted in Jan 2023 with R sided weakness.  He was initially worked up for acute CVA but his symptoms were ultimately felt to be due to cervical radiculopathy.  Echocardiogram showed normal EF and carotid US showed < 50% bilat ICA stenosis.  He returns for f/u.    He is here today with his wife.  He sees Dr. Arnoldo Morale w [facility] in several weeks.  He continues to note a spinning sensation and loss of vision with laying flat and, sometimes, with standing up.  He has not lost consciousness.  He has not had chest pain, shortness of breath, leg edema, orthopnea.      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 Take 2 tablets 1 hour prior to Rituxan treatment.   albuterol (PROAIR HFA) 108 (90 Base) MCG/ACT inhaler Inhale 2 puffs into the lungs at bedtime as needed for wheezing or shortness of breath.   atorvastatin (LIPITOR) 40 MG tablet Take 40 mg by mouth daily.   blood glucose meter kit and supplies KIT Dispense based on patient and insurance preference. Use at least three times a day to check BS levels and fluctuation.   budesonide-formoterol (SYMBICORT) 160-4.5 MCG/ACT inhaler Inhale 2 puffs into the lungs 2 (two) times daily.   cyclobenzaprine (FLEXERIL) 5 MG tablet Take 1 tablet (5 mg total) by mouth 2 (two) times daily as needed for muscle spasms.  docusate sodium (COLACE) 100 MG capsule Take 200 mg by mouth daily as needed for mild constipation.   fluticasone (FLONASE) 50 MCG/ACT nasal spray Place 1 spray into both nostrils as needed for allergies or rhinitis.   gabapentin (NEURONTIN) 300 MG capsule Take 1 cap in AM and 2 caps at night (300AM, 600PM)   hydrocortisone 2.5 % cream Apply 1 application topically as needed (itching).   ipratropium (ATROVENT) 0.06 % nasal spray Place 2 sprays into both nostrils 2 (two) times daily.    LORazepam (ATIVAN) 0.5 MG tablet Take 0.5 mg by mouth at bedtime.   meclizine (ANTIVERT) 25 MG tablet Take 1 tablet (25 mg total) by mouth 3 (three)  times daily as needed for dizziness.   propranolol ER (INDERAL LA) 80 MG 24 hr capsule Take 1 capsule (80 mg total) by mouth at bedtime.   testosterone cypionate (DEPOTESTOSTERONE CYPIONATE) 200 MG/ML injection Inject 200 mg into the muscle every 14 (fourteen) days.    TOUJEO SOLOSTAR 300 UNIT/ML Solostar Pen Inject 30 Units into the skin 2 (two) times daily.   [DISCONTINUED] amLODipine (NORVASC) 5 MG tablet Take 1 tablet (5 mg total) by mouth daily.    Allergies:   Bee venom, Adhesive [tape], Ciprofloxacin, Codeine, Iodine, Latex, Povidone-iodine, and Simvastatin   Social History   Tobacco Use   Smoking status: Never   Smokeless tobacco: Never  Vaping Use   Vaping Use: Never used  Substance Use Topics   Alcohol use: No   Drug use: No    Family Hx: The patient's family history includes Cancer in his brother and brother; Colon cancer in his brother; Diabetes in his father.  Review of Systems  Gastrointestinal:  Negative for hematochezia.  Genitourinary:  Negative for hematuria.    EKGs/Labs/Other Test Reviewed:    EKG:  EKG is not ordered today.  The ekg ordered today demonstrates n/a  Recent Labs: 08/28/2021: B Natriuretic Peptide 134.0 09/03/2021: TSH 4.963 10/11/2021: Magnesium 1.4 10/14/2021: ALT 16; Hemoglobin 14.2; Platelets 147 10/15/2021: BUN 45; Creatinine, Ser 1.62; Potassium 4.3; Sodium 137   Recent Lipid Panel Recent Labs    10/12/21 0436  CHOL 123  TRIG 103  HDL 37*  VLDL 21  LDLCALC 65     Risk Assessment/Calculations:         Physical Exam:    VS:  BP (!) 124/48 (BP Location: Right Arm)    Pulse 67    Ht _0  (1.803 m)    Wt 210 lb 9.6 oz (95.5 kg)    SpO2 93%    BMI 29.37 kg/m     Wt Readings from Last 3 Encounters:  10/22/21 210 lb 9.6 oz (95.5 kg)  10/11/21 208 lb 15.9 oz (94.8 kg)  10/09/21 219 lb 2.2 oz (99.4 kg)    Constitutional:      Appearance: Healthy appearance. Not in distress.  Neck:     Vascular: No JVR. JVD normal.   Pulmonary:     Effort: Pulmonary effort is normal.     Breath sounds: No wheezing. No rales.  Cardiovascular:     Normal rate. Regular rhythm. Normal S1. Normal S2.      Murmurs: There is no murmur.  Edema:    Peripheral edema absent.  Abdominal:     Palpations: Abdomen is soft.  Skin:    General: Skin is warm and dry.  Neurological:     General: No focal deficit present.     Mental Status: Alert and oriented to  person, place and time.     Cranial Nerves: Cranial nerves are intact.        ASSESSMENT & PLAN:   CAD (coronary artery disease) History of minimal plaque by cardiac catheterization in 2008 and 2011.  He is doing well without anginal symptoms.  Continue atorvastatin 40 mg daily.  Follow-up in 6 months.  Hypertension Blood pressure somewhat on the low side.  Orthostatic vital signs are negative today.  However, given his overall symptoms and low blood pressure, I will decrease his amlodipine to 2.5 mg daily.  Continue propranolol 80 mg daily.  Dizziness He describes what sounds like vertigo with associated loss of vision.  His work-up in the hospital was unremarkable aside from cervical foraminal narrowing.  He did not have significant carotid artery disease on carotid ultrasound.  His echocardiogram demonstrated normal LV function.  His MRI did not demonstrate evidence of acute CVA.  Orthostatic vital signs today are normal.  Given his age and low blood pressure, I will decrease his amlodipine to 2.5 mg daily.  He should keep follow-up with neurosurgery and neurology as planned. Orthostatic VS for the past 24 hrs (Last 3 readings):  BP- Lying Pulse- Lying BP- Sitting Pulse- Sitting BP- Standing at 0 minutes Pulse- Standing at 0 minutes BP- Standing at 3 minutes Pulse- Standing at 3 minutes  10/22/21 0831 111/74 55 118/75 56 114/75 62 128/82 60    Mixed hyperlipidemia Recent LDL 65.  Continue atorvastatin 40 mg daily  Aortic atherosclerosis (HCC) Continue statin therapy.   Consider aspirin.  I will discuss with the patient at his next visit.  Carotid artery plaque No significant stenosis on recent carotid ultrasound.  Continue atorvastatin 40 mg daily.          Dispo:  Return in about 6 months (around 04/21/2022) for Routine follow up in 6 months with Richardson Dopp, PA-C..   Medication Adjustments/Labs and Tests Ordered: Current medicines are reviewed at length with the patient today.  Concerns regarding medicines are outlined above.  Tests Ordered: No orders of the defined types were placed in this encounter.  Medication Changes: Meds ordered this encounter  Medications   amLODipine (NORVASC) 5 MG tablet    Sig: Take 0.5 tablets (2.5 mg total) by mouth daily.    Dispense:  90 tablet    Refill:  3   Signed, Richardson Dopp, PA-C  10/22/2021 8:51 AM    [facility] [address], [place], [place]  [address] Phone: [phone]; Fax: [phone]

## 2021-10-18 ENCOUNTER — Telehealth: Payer: Self-pay | Admitting: *Deleted

## 2021-10-18 NOTE — Telephone Encounter (Signed)
Called pt & LVM (ok per DPR) with message from Dr Jaynee Eagles below. Asked pt to give [place] a call back and let us know if he is willing to cancel his February 28th appointment with Dr. Jaynee Eagles and instead see Dr. Arnoldo Morale with [place] [facility] to see what he says. Advised that Dr. Jaynee Eagles cannot do anything surgically or any interventions for his neck, only Dr. Arnoldo Morale can.  Certainly if Dr. Arnoldo Morale thinks the patient needs an EMG/NCS he will refer back to Dr. Jaynee Eagles, but she does not want to put the patient through any unnecessary testing.  Left office number for patient to call back.   Melvenia Beam, MD  P Gna-Pod 4 Calls Dr. Erlinda Hong saw patient and was sure his arm pain was coming from his neck. I would have patient see Dr. Arnoldo Morale first before seeing me for emg/ncs(that is why dr Erlinda Hong is having me see him), I can;t do anything surgically or interventially for his neck only Dr Arnoldo Morale can. If Dr. Arnoldo Morale thinks he needs an emg/ncs he will refer back but I don't want to put patient through an unnecessary test. If patient is willing I would have him cancel his appointment with me and see Dr Arnoldo Morale at [facility] first. He is established there and can call for appointment. I think Dr. Erlinda Hong told him to see Dr. Youlanda Roys. thanks

## 2021-10-18 NOTE — Telephone Encounter (Signed)
I called pt and LMVM for him to return call  I did relay some of message to him on VM. FW: Inpatient Notes Received: Elinor Dodge, MD  P Gna-Pod 4 Calls Dr. Erlinda Hong saw patient and was sure his arm pain was coming from his neck. I would have patient see Dr. Arnoldo Morale first before seeing me for emg/ncs(that is why dr Erlinda Hong is having me see him), I can;t do anything surgically or interventially for his neck only Dr Arnoldo Morale can. If Dr. Arnoldo Morale thinks he needs an emg/ncs he will refer back but I don't want to put patient through an unnecessary test. If patient is willing I would have him cancel his appointment with me and see Dr Arnoldo Morale at [facility] first. He is established there and can call for appointment. I think Dr. Erlinda Hong told him to see Dr. Youlanda Roys. thanks

## 2021-10-19 DIAGNOSIS — N179 Acute kidney failure, unspecified: Secondary | ICD-10-CM | POA: Diagnosis not present

## 2021-10-19 DIAGNOSIS — D696 Thrombocytopenia, unspecified: Secondary | ICD-10-CM | POA: Diagnosis not present

## 2021-10-19 DIAGNOSIS — E1151 Type 2 diabetes mellitus with diabetic peripheral angiopathy without gangrene: Secondary | ICD-10-CM | POA: Diagnosis not present

## 2021-10-19 DIAGNOSIS — C8310 Mantle cell lymphoma, unspecified site: Secondary | ICD-10-CM | POA: Diagnosis not present

## 2021-10-19 DIAGNOSIS — N1832 Chronic kidney disease, stage 3b: Secondary | ICD-10-CM | POA: Diagnosis not present

## 2021-10-19 DIAGNOSIS — G250 Essential tremor: Secondary | ICD-10-CM | POA: Diagnosis not present

## 2021-10-19 DIAGNOSIS — N10 Acute tubulo-interstitial nephritis: Secondary | ICD-10-CM | POA: Diagnosis not present

## 2021-10-19 DIAGNOSIS — E1165 Type 2 diabetes mellitus with hyperglycemia: Secondary | ICD-10-CM | POA: Diagnosis not present

## 2021-10-19 DIAGNOSIS — G4700 Insomnia, unspecified: Secondary | ICD-10-CM | POA: Diagnosis not present

## 2021-10-19 DIAGNOSIS — K219 Gastro-esophageal reflux disease without esophagitis: Secondary | ICD-10-CM | POA: Diagnosis not present

## 2021-10-19 DIAGNOSIS — G9341 Metabolic encephalopathy: Secondary | ICD-10-CM | POA: Diagnosis not present

## 2021-10-19 DIAGNOSIS — R918 Other nonspecific abnormal finding of lung field: Secondary | ICD-10-CM | POA: Diagnosis not present

## 2021-10-19 DIAGNOSIS — E1122 Type 2 diabetes mellitus with diabetic chronic kidney disease: Secondary | ICD-10-CM | POA: Diagnosis not present

## 2021-10-19 DIAGNOSIS — R339 Retention of urine, unspecified: Secondary | ICD-10-CM | POA: Diagnosis not present

## 2021-10-19 DIAGNOSIS — N1330 Unspecified hydronephrosis: Secondary | ICD-10-CM | POA: Diagnosis not present

## 2021-10-19 DIAGNOSIS — Z466 Encounter for fitting and adjustment of urinary device: Secondary | ICD-10-CM | POA: Diagnosis not present

## 2021-10-19 DIAGNOSIS — I129 Hypertensive chronic kidney disease with stage 1 through stage 4 chronic kidney disease, or unspecified chronic kidney disease: Secondary | ICD-10-CM | POA: Diagnosis not present

## 2021-10-19 DIAGNOSIS — E1142 Type 2 diabetes mellitus with diabetic polyneuropathy: Secondary | ICD-10-CM | POA: Diagnosis not present

## 2021-10-19 DIAGNOSIS — G4733 Obstructive sleep apnea (adult) (pediatric): Secondary | ICD-10-CM | POA: Diagnosis not present

## 2021-10-19 DIAGNOSIS — I2510 Atherosclerotic heart disease of native coronary artery without angina pectoris: Secondary | ICD-10-CM | POA: Diagnosis not present

## 2021-10-19 DIAGNOSIS — J45909 Unspecified asthma, uncomplicated: Secondary | ICD-10-CM | POA: Diagnosis not present

## 2021-10-20 DIAGNOSIS — I700 Atherosclerosis of aorta: Secondary | ICD-10-CM | POA: Insufficient documentation

## 2021-10-22 ENCOUNTER — Ambulatory Visit: Payer: Medicare Other | Admitting: Physician Assistant

## 2021-10-22 ENCOUNTER — Encounter: Payer: Self-pay | Admitting: Physician Assistant

## 2021-10-22 ENCOUNTER — Other Ambulatory Visit: Payer: Self-pay

## 2021-10-22 VITALS — BP 124/48 | HR 67 | Ht 71.0 in | Wt 210.6 lb

## 2021-10-22 DIAGNOSIS — R42 Dizziness and giddiness: Secondary | ICD-10-CM | POA: Diagnosis not present

## 2021-10-22 DIAGNOSIS — I6523 Occlusion and stenosis of bilateral carotid arteries: Secondary | ICD-10-CM | POA: Diagnosis not present

## 2021-10-22 DIAGNOSIS — E782 Mixed hyperlipidemia: Secondary | ICD-10-CM

## 2021-10-22 DIAGNOSIS — I2510 Atherosclerotic heart disease of native coronary artery without angina pectoris: Secondary | ICD-10-CM | POA: Diagnosis not present

## 2021-10-22 DIAGNOSIS — I10 Essential (primary) hypertension: Secondary | ICD-10-CM

## 2021-10-22 DIAGNOSIS — I700 Atherosclerosis of aorta: Secondary | ICD-10-CM

## 2021-10-22 MED ORDER — AMLODIPINE BESYLATE 5 MG PO TABS: 2.5000 mg | ORAL_TABLET | Freq: Every day | ORAL | 3 refills | Status: DC

## 2021-10-22 NOTE — Assessment & Plan Note (Addendum)
He describes what sounds like vertigo with associated loss of vision.  His work-up in the hospital was unremarkable aside from cervical foraminal narrowing.  He did not have significant carotid artery disease on carotid ultrasound.  His echocardiogram demonstrated normal LV function.  His MRI did not demonstrate evidence of acute CVA.  Orthostatic vital signs today are normal.  Given his age and low blood pressure, I will decrease his amlodipine to 2.5 mg daily.  He should keep follow-up with neurosurgery and neurology as planned. Orthostatic VS for the past 24 hrs (Last 3 readings):  BP- Lying Pulse- Lying BP- Sitting Pulse- Sitting BP- Standing at 0 minutes Pulse- Standing at 0 minutes BP- Standing at 3 minutes Pulse- Standing at 3 minutes  10/22/21 0831 111/74 55 118/75 56 114/75 62 128/82 60

## 2021-10-22 NOTE — Assessment & Plan Note (Signed)
No significant stenosis on recent carotid ultrasound.  Continue atorvastatin 40 mg daily.

## 2021-10-22 NOTE — Assessment & Plan Note (Signed)
Recent LDL 65.  Continue atorvastatin 40 mg daily

## 2021-10-22 NOTE — Patient Instructions (Addendum)
Medication Instructions:   DECREASE Amlodipine one half tablet by mouth ( 2.5 mg) daily.   *If you need a refill on your cardiac medications before your next appointment, please call your pharmacy*   Lab Work:  None ordered.  If you have labs (blood work) drawn today and your tests are completely normal, you will receive your results only by: [place] (if you have MyChart) OR A paper copy in the mail If you have any lab test that is abnormal or we need to change your treatment, we will call you to review the results.   Testing/Procedures:  None ordered.    Follow-Up: At [facility], you and your health needs are our priority.  As part of our continuing mission to provide you with exceptional heart care, we have created designated Provider Care Teams.  These Care Teams include your primary Cardiologist (physician) and Advanced Practice Providers (APPs -  Physician Assistants and Nurse Practitioners) who all work together to provide you with the care you need, when you need it.  We recommend signing up for the patient portal called "MyChart".  Sign up information is provided on this After Visit Summary.  MyChart is used to connect with patients for Virtual Visits (Telemedicine).  Patients are able to view lab/test results, encounter notes, upcoming appointments, etc.  Non-urgent messages can be sent to your provider as well.   To learn more about what you can do with MyChart, go to [email or web address].    Your next appointment:   6 month(s)  The format for your next appointment:   In Person  Provider:   Richardson Dopp, PA-C         Other Instructions  Your physician wants you to follow-up in: 6 month with Richardson Dopp, PA-C.  You will receive a reminder letter in the mail two months in advance. If you don't receive a letter, please call our office to schedule the follow-up appointment.

## 2021-10-22 NOTE — Assessment & Plan Note (Addendum)
Blood pressure somewhat on the low side.  Orthostatic vital signs are negative today.  However, given his overall symptoms and low blood pressure, I will decrease his amlodipine to 2.5 mg daily.  Continue propranolol 80 mg daily.

## 2021-10-22 NOTE — Assessment & Plan Note (Signed)
History of minimal plaque by cardiac catheterization in 2008 and 2011.  He is doing well without anginal symptoms.  Continue atorvastatin 40 mg daily.  Follow-up in 6 months.

## 2021-10-22 NOTE — Assessment & Plan Note (Signed)
Continue statin therapy.  Consider aspirin.  I will discuss with the patient at his next visit.

## 2021-10-23 DIAGNOSIS — Z466 Encounter for fitting and adjustment of urinary device: Secondary | ICD-10-CM | POA: Diagnosis not present

## 2021-10-23 DIAGNOSIS — R918 Other nonspecific abnormal finding of lung field: Secondary | ICD-10-CM | POA: Diagnosis not present

## 2021-10-23 DIAGNOSIS — R339 Retention of urine, unspecified: Secondary | ICD-10-CM | POA: Diagnosis not present

## 2021-10-23 DIAGNOSIS — N1832 Chronic kidney disease, stage 3b: Secondary | ICD-10-CM | POA: Diagnosis not present

## 2021-10-23 DIAGNOSIS — D696 Thrombocytopenia, unspecified: Secondary | ICD-10-CM | POA: Diagnosis not present

## 2021-10-23 DIAGNOSIS — N1330 Unspecified hydronephrosis: Secondary | ICD-10-CM | POA: Diagnosis not present

## 2021-10-23 DIAGNOSIS — E1122 Type 2 diabetes mellitus with diabetic chronic kidney disease: Secondary | ICD-10-CM | POA: Diagnosis not present

## 2021-10-23 DIAGNOSIS — E1151 Type 2 diabetes mellitus with diabetic peripheral angiopathy without gangrene: Secondary | ICD-10-CM | POA: Diagnosis not present

## 2021-10-23 DIAGNOSIS — G9341 Metabolic encephalopathy: Secondary | ICD-10-CM | POA: Diagnosis not present

## 2021-10-23 DIAGNOSIS — G250 Essential tremor: Secondary | ICD-10-CM | POA: Diagnosis not present

## 2021-10-23 DIAGNOSIS — J45909 Unspecified asthma, uncomplicated: Secondary | ICD-10-CM | POA: Diagnosis not present

## 2021-10-23 DIAGNOSIS — G4700 Insomnia, unspecified: Secondary | ICD-10-CM | POA: Diagnosis not present

## 2021-10-23 DIAGNOSIS — K219 Gastro-esophageal reflux disease without esophagitis: Secondary | ICD-10-CM | POA: Diagnosis not present

## 2021-10-23 DIAGNOSIS — N179 Acute kidney failure, unspecified: Secondary | ICD-10-CM | POA: Diagnosis not present

## 2021-10-23 DIAGNOSIS — G4733 Obstructive sleep apnea (adult) (pediatric): Secondary | ICD-10-CM | POA: Diagnosis not present

## 2021-10-23 DIAGNOSIS — I129 Hypertensive chronic kidney disease with stage 1 through stage 4 chronic kidney disease, or unspecified chronic kidney disease: Secondary | ICD-10-CM | POA: Diagnosis not present

## 2021-10-23 DIAGNOSIS — C8310 Mantle cell lymphoma, unspecified site: Secondary | ICD-10-CM | POA: Diagnosis not present

## 2021-10-23 DIAGNOSIS — I2510 Atherosclerotic heart disease of native coronary artery without angina pectoris: Secondary | ICD-10-CM | POA: Diagnosis not present

## 2021-10-23 DIAGNOSIS — E1142 Type 2 diabetes mellitus with diabetic polyneuropathy: Secondary | ICD-10-CM | POA: Diagnosis not present

## 2021-10-23 DIAGNOSIS — E1165 Type 2 diabetes mellitus with hyperglycemia: Secondary | ICD-10-CM | POA: Diagnosis not present

## 2021-10-23 DIAGNOSIS — N10 Acute tubulo-interstitial nephritis: Secondary | ICD-10-CM | POA: Diagnosis not present

## 2021-10-24 DIAGNOSIS — E782 Mixed hyperlipidemia: Secondary | ICD-10-CM | POA: Diagnosis not present

## 2021-10-24 DIAGNOSIS — E1122 Type 2 diabetes mellitus with diabetic chronic kidney disease: Secondary | ICD-10-CM | POA: Diagnosis not present

## 2021-10-29 DIAGNOSIS — E1122 Type 2 diabetes mellitus with diabetic chronic kidney disease: Secondary | ICD-10-CM | POA: Diagnosis not present

## 2021-10-29 DIAGNOSIS — Z0001 Encounter for general adult medical examination with abnormal findings: Secondary | ICD-10-CM | POA: Diagnosis not present

## 2021-10-29 DIAGNOSIS — D6949 Other primary thrombocytopenia: Secondary | ICD-10-CM | POA: Diagnosis not present

## 2021-10-29 DIAGNOSIS — N390 Urinary tract infection, site not specified: Secondary | ICD-10-CM | POA: Diagnosis not present

## 2021-10-29 DIAGNOSIS — M6281 Muscle weakness (generalized): Secondary | ICD-10-CM | POA: Diagnosis not present

## 2021-10-29 DIAGNOSIS — G250 Essential tremor: Secondary | ICD-10-CM | POA: Diagnosis not present

## 2021-10-29 DIAGNOSIS — K219 Gastro-esophageal reflux disease without esophagitis: Secondary | ICD-10-CM | POA: Diagnosis not present

## 2021-10-29 DIAGNOSIS — E1140 Type 2 diabetes mellitus with diabetic neuropathy, unspecified: Secondary | ICD-10-CM | POA: Diagnosis not present

## 2021-10-29 DIAGNOSIS — J45901 Unspecified asthma with (acute) exacerbation: Secondary | ICD-10-CM | POA: Diagnosis not present

## 2021-10-29 DIAGNOSIS — E782 Mixed hyperlipidemia: Secondary | ICD-10-CM | POA: Diagnosis not present

## 2021-10-29 DIAGNOSIS — N1832 Chronic kidney disease, stage 3b: Secondary | ICD-10-CM | POA: Diagnosis not present

## 2021-10-29 DIAGNOSIS — L299 Pruritus, unspecified: Secondary | ICD-10-CM | POA: Diagnosis not present

## 2021-10-30 ENCOUNTER — Telehealth: Payer: Self-pay | Admitting: *Deleted

## 2021-10-30 NOTE — Telephone Encounter (Signed)
-----   Message from Liliane Shi, [place] sent at 10/29/2021 12:46 PM EST ----- Can we check on him to see how he is doing since I reduced the dose of his Amlodipine? Thanks [employer]

## 2021-10-30 NOTE — Telephone Encounter (Signed)
S/w pt per Nicki Reaper. Pt is the same.  Hasn't gotten any better has not got any worse. Will send to [place] to [place].

## 2021-11-01 NOTE — Telephone Encounter (Signed)
Ask if he can send [place] some BP readings. Richardson Dopp, PA-C    11/01/2021 11:40 AM

## 2021-11-02 NOTE — Telephone Encounter (Signed)
S/w pt and pt's spouse with recommendations. Pt will take bp daily and record.  Pt will send in by mail bp readings X 2 weeks.

## 2021-11-14 ENCOUNTER — Encounter (INDEPENDENT_AMBULATORY_CARE_PROVIDER_SITE_OTHER): Payer: Self-pay | Admitting: *Deleted

## 2021-11-16 DIAGNOSIS — Z87448 Personal history of other diseases of urinary system: Secondary | ICD-10-CM | POA: Diagnosis not present

## 2021-11-16 DIAGNOSIS — N35914 Unspecified anterior urethral stricture, male: Secondary | ICD-10-CM | POA: Diagnosis not present

## 2021-11-20 ENCOUNTER — Ambulatory Visit: Payer: Medicare Other | Admitting: Neurology

## 2021-11-20 DIAGNOSIS — M542 Cervicalgia: Secondary | ICD-10-CM | POA: Diagnosis not present

## 2021-11-30 DIAGNOSIS — E1122 Type 2 diabetes mellitus with diabetic chronic kidney disease: Secondary | ICD-10-CM | POA: Diagnosis not present

## 2021-11-30 DIAGNOSIS — R55 Syncope and collapse: Secondary | ICD-10-CM | POA: Diagnosis not present

## 2021-12-03 ENCOUNTER — Other Ambulatory Visit (INDEPENDENT_AMBULATORY_CARE_PROVIDER_SITE_OTHER): Payer: Self-pay

## 2021-12-03 ENCOUNTER — Encounter (INDEPENDENT_AMBULATORY_CARE_PROVIDER_SITE_OTHER): Payer: Self-pay | Admitting: *Deleted

## 2021-12-03 ENCOUNTER — Telehealth (INDEPENDENT_AMBULATORY_CARE_PROVIDER_SITE_OTHER): Payer: Self-pay | Admitting: *Deleted

## 2021-12-03 DIAGNOSIS — Z86010 Personal history of colonic polyps: Secondary | ICD-10-CM

## 2021-12-03 DIAGNOSIS — Z85038 Personal history of other malignant neoplasm of large intestine: Secondary | ICD-10-CM

## 2021-12-03 NOTE — Telephone Encounter (Signed)
Patient needs trilyte 

## 2021-12-04 ENCOUNTER — Telehealth: Payer: Self-pay | Admitting: Physician Assistant

## 2021-12-04 MED ORDER — PEG 3350-KCL-NA BICARB-NACL 420 G PO SOLR: 4000.0000 mL | Freq: Once | ORAL | 0 refills | Status: AC

## 2021-12-04 NOTE — Telephone Encounter (Signed)
Reviewed BP log pt mailed to office. ?Most of his BP readings are above goal.   ? ?Ask pt if his BP machine is working well / accurate. ?When we last checked on him, he had not noted any improvement with reducing the dose of Amlodipine.  ? ?PLAN:  ?-If BP machine seems to be accurate, increase amlodipine back to 5 mg once daily.  He can take in the evening to lessen side effects if needed. ?-If BP machine is questionable, get new machine or provide one from our office.  Recheck BP for 2 weeks and update [place] on results.  ? ?Richardson Dopp, PA-C    ?12/04/2021 5:56 PM   ?

## 2021-12-05 MED ORDER — AMLODIPINE BESYLATE 5 MG PO TABS: 5.0000 mg | ORAL_TABLET | Freq: Every day | ORAL | 3 refills | Status: DC

## 2021-12-05 NOTE — Telephone Encounter (Signed)
Call placed to pt regarding message below. ?Pt states that he has a brand new blood pressure machine. ? ?With that being said, increased his Amlodipine to 5 mg daily.  He has been made aware that he can take it in the evening if he needs to due to side effects. ? ?Pt advised to continue to monitor his bp and send in some readings in about 2 weeks. ? ?Pt verbalized understanding.  ?

## 2021-12-06 ENCOUNTER — Telehealth (INDEPENDENT_AMBULATORY_CARE_PROVIDER_SITE_OTHER): Payer: Self-pay | Admitting: *Deleted

## 2021-12-06 NOTE — Telephone Encounter (Signed)
Referring MD/PCP: hall ? ?Procedure: tcs ? ?Reason/Indication:  hx colon ca, hx polyps ? ?Has patient had this procedure before?  Yes, 07/2015 ? If so, when, by whom and where?   ? ?Is there a family history of colon cancer?  no ? Who?  What age when diagnosed?   ? ?Is patient diabetic? If yes, Type 1 or Type 2   yes, tyoe 2 ?     ?Does patient have prosthetic heart valve or mechanical valve?  no ? ?Do you have a pacemaker/defibrillator?  no ? ?Has patient ever had endocarditis/atrial fibrillation? no ? ?Does patient use oxygen? no ? ?Has patient had joint replacement within last 12 months?  no ? ?Is patient constipated or do they take laxatives? yes ? ?Does patient have a history of alcohol/drug use?  no ? ?Have you had a stroke/heart attack last 6 mths? no ? ?Do you take medicine for weight loss?  no ? ?For male patients,: have you had a hysterectomy  ?                     are you post menopausal  ?                     do you still have your menstrual cycle  ? ?Is patient on blood thinner such as Coumadin, Plavix and/or Aspirin? yes ? ?Medications: asa 81 mg daily, pantoprazole 40 mg bid, metformin 750 mg daily, cipro 500 mg bid ? ?Allergies: see epic ? ?Medication Adjustment per Dr Rehman/Dr Jenetta Downer asa 2 days, hold metformin evening before & morning of ? ?Procedure date & time: 01/02/22 ? ? ?

## 2021-12-26 NOTE — Patient Instructions (Signed)
? ? ? ? ? ? [name] ? 12/26/2021  ?  ? @PREFPERIOPPHARMACY @ ? ? Your procedure is scheduled on  01/02/2022. ? ? Report to [name] at  17 A.M. ? ? Call this number if you have problems the morning of surgery: ? [phone] ? ? Remember: ? Follow the diet and prep instructions given to you by the office. ? ?Take 15 units of your evening insulin. ? ?DO NOT take any medications for diabetes the morning of your procedure. ? ?Use your inhalers before you come and bring your rescue inhaler with you. ?  ? Take these medicines the morning of surgery with A SIP OF WATER  ? ?norvasc, flexeril(if needed), gabapentin, antivert(if needed), protonix, metoprolol. ?  ? Do not wear jewelry, make-up or nail polish. ? Do not wear lotions, powders, or perfumes, or deodorant. ? Do not shave 48 hours prior to surgery.  Men may shave face and neck. ? Do not bring valuables to the hospital. ? [name] is not responsible for any belongings or valuables. ? ?Contacts, dentures or bridgework may not be worn into surgery.  Leave your suitcase in the car.  After surgery it may be brought to your room. ? ?For patients admitted to the hospital, discharge time will be determined by your treatment team. ? ?Patients discharged the day of surgery will not be allowed to drive home and must have someone with them for 24 hours.  ? ? ?Special instructions:   DO NOT smoke tobacco or vape for 24 hours before your procedure. ? ?Please read over the following fact sheets that you were given. ?Anesthesia Post-op Instructions and Care and Recovery After Surgery ?  ? ? ? Colonoscopy, Adult, Care After ?This sheet gives you information about how to care for yourself after your procedure. Your health care provider may also give you more specific instructions. If you have problems or questions, contact your health care provider. ?What can I expect after the procedure? ?After the procedure, it is common to have: ?A small amount of blood in your stool  for 24 hours after the procedure. ?Some gas. ?Mild cramping or bloating of your abdomen. ?Follow these instructions at home: ?Eating and drinking ? ?Drink enough fluid to keep your urine pale yellow. ?Follow instructions from your health care provider about eating or drinking restrictions. ?Resume your normal diet as instructed by your health care provider. Avoid heavy or fried foods that are hard to digest. ?Activity ?Rest as told by your health care provider. ?Avoid sitting for a long time without moving. Get up to take short walks every 1-2 hours. This is important to improve blood flow and breathing. Ask for help if you feel weak or unsteady. ?Return to your normal activities as told by your health care provider. Ask your health care provider what activities are safe for you. ?Managing cramping and bloating ? ?Try walking around when you have cramps or feel bloated. ?Apply heat to your abdomen as told by your health care provider. Use the heat source that your health care provider recommends, such as a moist heat pack or a heating pad. ?Place a towel between your skin and the heat source. ?Leave the heat on for 20-30 minutes. ?Remove the heat if your skin turns bright red. This is especially important if you are unable to feel pain, heat, or cold. You may have a greater risk of getting burned. ?General instructions ?If you were given a sedative during the procedure, it can  affect you for several hours. Do not drive or operate machinery until your health care provider says that it is safe. ?For the first 24 hours after the procedure: ?Do not sign important documents. ?Do not drink alcohol. ?Do your regular daily activities at a slower pace than normal. ?Eat soft foods that are easy to digest. ?Take over-the-counter and prescription medicines only as told by your health care provider. ?Keep all follow-up visits as told by your health care provider. This is important. ?Contact a health care provider if: ?You have  blood in your stool 2-3 days after the procedure. ?Get help right away if you have: ?More than a small spotting of blood in your stool. ?Large blood clots in your stool. ?Swelling of your abdomen. ?Nausea or vomiting. ?A fever. ?Increasing pain in your abdomen that is not relieved with medicine. ?Summary ?After the procedure, it is common to have a small amount of blood in your stool. You may also have mild cramping and bloating of your abdomen. ?If you were given a sedative during the procedure, it can affect you for several hours. Do not drive or operate machinery until your health care provider says that it is safe. ?Get help right away if you have a lot of blood in your stool, nausea or vomiting, a fever, or increased pain in your abdomen. ?This information is not intended to replace advice given to you by your health care provider. Make sure you discuss any questions you have with your health care provider. ?Document Revised: 07/16/2019 Document Reviewed: 04/05/2019 ?Elsevier Patient Education ? [place]. ?Monitored Anesthesia Care, Care After ?This sheet gives you information about how to care for yourself after your procedure. Your health care provider may also give you more specific instructions. If you have problems or questions, contact your health care provider. ?What can I expect after the procedure? ?After the procedure, it is common to have: ?Tiredness. ?Forgetfulness about what happened after the procedure. ?Impaired judgment for important decisions. ?Nausea or vomiting. ?Some difficulty with balance. ?Follow these instructions at home: ?For the time period you were told by your health care provider: ?  ?Rest as needed. ?Do not participate in activities where you could fall or become injured. ?Do not drive or use machinery. ?Do not drink alcohol. ?Do not take sleeping pills or medicines that cause drowsiness. ?Do not make important decisions or sign legal documents. ?Do not take care of  children on your own. ?Eating and drinking ?Follow the diet that is recommended by your health care provider. ?Drink enough fluid to keep your urine pale yellow. ?If you vomit: ?Drink water, juice, or soup when you can drink without vomiting. ?Make sure you have little or no nausea before eating solid foods. ?General instructions ?Have a responsible adult stay with you for the time you are told. It is important to have someone help care for you until you are awake and alert. ?Take over-the-counter and prescription medicines only as told by your health care provider. ?If you have sleep apnea, surgery and certain medicines can increase your risk for breathing problems. Follow instructions from your health care provider about wearing your sleep device: ?Anytime you are sleeping, including during daytime naps. ?While taking prescription pain medicines, sleeping medicines, or medicines that make you drowsy. ?Avoid smoking. ?Keep all follow-up visits as told by your health care provider. This is important. ?Contact a health care provider if: ?You keep feeling nauseous or you keep vomiting. ?You feel light-headed. ?You are still sleepy  or having trouble with balance after 24 hours. ?You develop a rash. ?You have a fever. ?You have redness or swelling around the IV site. ?Get help right away if: ?You have trouble breathing. ?You have new-onset confusion at home. ?Summary ?For several hours after your procedure, you may feel tired. You may also be forgetful and have poor judgment. ?Have a responsible adult stay with you for the time you are told. It is important to have someone help care for you until you are awake and alert. ?Rest as told. Do not drive or operate machinery. Do not drink alcohol or take sleeping pills. ?Get help right away if you have trouble breathing, or if you suddenly become confused. ?This information is not intended to replace advice given to you by your health care provider. Make sure you discuss any  questions you have with your health care provider. ?Document Revised: 05/25/2020 Document Reviewed: 08/12/2019 ?Elsevier Patient Education ? [place]. ? ?

## 2021-12-27 DIAGNOSIS — E1165 Type 2 diabetes mellitus with hyperglycemia: Secondary | ICD-10-CM | POA: Diagnosis not present

## 2021-12-31 ENCOUNTER — Encounter (HOSPITAL_COMMUNITY)
Admission: RE | Admit: 2021-12-31 | Discharge: 2021-12-31 | Disposition: A | Discharge status: Home / Self Care | Payer: Medicare Other | Source: Ambulatory Visit | Attending: Internal Medicine | Admitting: Internal Medicine

## 2021-12-31 ENCOUNTER — Other Ambulatory Visit: Payer: Self-pay

## 2021-12-31 ENCOUNTER — Encounter (HOSPITAL_COMMUNITY): Payer: Self-pay

## 2022-01-02 ENCOUNTER — Ambulatory Visit (HOSPITAL_COMMUNITY): Payer: Medicare Other | Admitting: Anesthesiology

## 2022-01-02 ENCOUNTER — Ambulatory Visit (HOSPITAL_BASED_OUTPATIENT_CLINIC_OR_DEPARTMENT_OTHER): Payer: Medicare Other | Admitting: Anesthesiology

## 2022-01-02 ENCOUNTER — Encounter (HOSPITAL_COMMUNITY)
Admission: RE | Disposition: A | Discharge status: Home / Self Care | Payer: Self-pay | Source: Home / Self Care | Attending: Internal Medicine

## 2022-01-02 ENCOUNTER — Encounter (INDEPENDENT_AMBULATORY_CARE_PROVIDER_SITE_OTHER): Payer: Self-pay | Admitting: *Deleted

## 2022-01-02 ENCOUNTER — Ambulatory Visit (HOSPITAL_COMMUNITY)
Admission: RE | Admit: 2022-01-02 | Discharge: 2022-01-02 | Disposition: A | Discharge status: Home / Self Care | Payer: Medicare Other | Attending: Internal Medicine | Admitting: Internal Medicine

## 2022-01-02 ENCOUNTER — Encounter (HOSPITAL_COMMUNITY): Payer: Self-pay | Admitting: Internal Medicine

## 2022-01-02 ENCOUNTER — Other Ambulatory Visit (INDEPENDENT_AMBULATORY_CARE_PROVIDER_SITE_OTHER): Payer: Self-pay | Admitting: Internal Medicine

## 2022-01-02 DIAGNOSIS — G4733 Obstructive sleep apnea (adult) (pediatric): Secondary | ICD-10-CM | POA: Diagnosis not present

## 2022-01-02 DIAGNOSIS — Z8572 Personal history of non-Hodgkin lymphomas: Secondary | ICD-10-CM | POA: Insufficient documentation

## 2022-01-02 DIAGNOSIS — K635 Polyp of colon: Secondary | ICD-10-CM

## 2022-01-02 DIAGNOSIS — Z800 Family history of malignant neoplasm of digestive organs: Secondary | ICD-10-CM | POA: Diagnosis not present

## 2022-01-02 DIAGNOSIS — I10 Essential (primary) hypertension: Secondary | ICD-10-CM | POA: Diagnosis not present

## 2022-01-02 DIAGNOSIS — K5730 Diverticulosis of large intestine without perforation or abscess without bleeding: Secondary | ICD-10-CM | POA: Insufficient documentation

## 2022-01-02 DIAGNOSIS — K644 Residual hemorrhoidal skin tags: Secondary | ICD-10-CM

## 2022-01-02 DIAGNOSIS — Z09 Encounter for follow-up examination after completed treatment for conditions other than malignant neoplasm: Secondary | ICD-10-CM

## 2022-01-02 DIAGNOSIS — K621 Rectal polyp: Secondary | ICD-10-CM

## 2022-01-02 DIAGNOSIS — D128 Benign neoplasm of rectum: Secondary | ICD-10-CM | POA: Insufficient documentation

## 2022-01-02 DIAGNOSIS — Z7984 Long term (current) use of oral hypoglycemic drugs: Secondary | ICD-10-CM

## 2022-01-02 DIAGNOSIS — D125 Benign neoplasm of sigmoid colon: Secondary | ICD-10-CM | POA: Insufficient documentation

## 2022-01-02 DIAGNOSIS — I2510 Atherosclerotic heart disease of native coronary artery without angina pectoris: Secondary | ICD-10-CM | POA: Insufficient documentation

## 2022-01-02 DIAGNOSIS — K219 Gastro-esophageal reflux disease without esophagitis: Secondary | ICD-10-CM | POA: Diagnosis not present

## 2022-01-02 DIAGNOSIS — Z9221 Personal history of antineoplastic chemotherapy: Secondary | ICD-10-CM | POA: Insufficient documentation

## 2022-01-02 DIAGNOSIS — Z86010 Personal history of colonic polyps: Secondary | ICD-10-CM

## 2022-01-02 DIAGNOSIS — K5150 Left sided colitis without complications: Secondary | ICD-10-CM | POA: Insufficient documentation

## 2022-01-02 DIAGNOSIS — D122 Benign neoplasm of ascending colon: Secondary | ICD-10-CM | POA: Diagnosis not present

## 2022-01-02 DIAGNOSIS — D120 Benign neoplasm of cecum: Secondary | ICD-10-CM | POA: Diagnosis not present

## 2022-01-02 DIAGNOSIS — K5520 Angiodysplasia of colon without hemorrhage: Secondary | ICD-10-CM | POA: Diagnosis not present

## 2022-01-02 DIAGNOSIS — E1151 Type 2 diabetes mellitus with diabetic peripheral angiopathy without gangrene: Secondary | ICD-10-CM | POA: Insufficient documentation

## 2022-01-02 DIAGNOSIS — Z1211 Encounter for screening for malignant neoplasm of colon: Secondary | ICD-10-CM | POA: Diagnosis not present

## 2022-01-02 DIAGNOSIS — Q2739 Arteriovenous malformation, other site: Secondary | ICD-10-CM | POA: Diagnosis not present

## 2022-01-02 DIAGNOSIS — Z85038 Personal history of other malignant neoplasm of large intestine: Secondary | ICD-10-CM

## 2022-01-02 DIAGNOSIS — E119 Type 2 diabetes mellitus without complications: Secondary | ICD-10-CM | POA: Diagnosis not present

## 2022-01-02 HISTORY — PX: POLYPECTOMY: SHX5525

## 2022-01-02 HISTORY — PX: COLONOSCOPY WITH PROPOFOL: SHX5780

## 2022-01-02 LAB — HM COLONOSCOPY

## 2022-01-02 LAB — GLUCOSE, CAPILLARY: Glucose-Capillary: 88 mg/dL (ref 70–99)

## 2022-01-02 SURGERY — COLONOSCOPY WITH PROPOFOL
Anesthesia: General

## 2022-01-02 MED ORDER — PHENYLEPHRINE 40 MCG/ML (10ML) SYRINGE FOR IV PUSH (FOR BLOOD PRESSURE SUPPORT)
PREFILLED_SYRINGE | INTRAVENOUS | Status: AC
Start: 2022-01-02 — End: ?
  Filled 2022-01-02: qty 10

## 2022-01-02 MED ORDER — PROPOFOL 10 MG/ML IV BOLUS
INTRAVENOUS | Status: DC | PRN
  Administered 2022-01-02: 80 mg via INTRAVENOUS
  Administered 2022-01-02: 20 mg via INTRAVENOUS

## 2022-01-02 MED ORDER — LIDOCAINE HCL (CARDIAC) PF 100 MG/5ML IV SOSY
PREFILLED_SYRINGE | INTRAVENOUS | Status: DC | PRN
  Administered 2022-01-02: 50 mg via INTRAVENOUS

## 2022-01-02 MED ORDER — PHENYLEPHRINE HCL (PRESSORS) 10 MG/ML IV SOLN
INTRAVENOUS | Status: DC | PRN
  Administered 2022-01-02 (×7): 80 ug via INTRAVENOUS

## 2022-01-02 MED ORDER — PROPOFOL 500 MG/50ML IV EMUL
INTRAVENOUS | Status: DC | PRN
  Administered 2022-01-02: 125 ug/kg/min via INTRAVENOUS

## 2022-01-02 MED ORDER — PHENYLEPHRINE 40 MCG/ML (10ML) SYRINGE FOR IV PUSH (FOR BLOOD PRESSURE SUPPORT)
PREFILLED_SYRINGE | INTRAVENOUS | Status: AC
  Filled 2022-01-02: qty 20

## 2022-01-02 MED ORDER — PANTOPRAZOLE SODIUM 40 MG PO TBEC: 40.0000 mg | DELAYED_RELEASE_TABLET | Freq: Two times a day (BID) | ORAL | 5 refills | Status: DC

## 2022-01-02 MED ORDER — LACTATED RINGERS IV SOLN: INTRAVENOUS | Status: DC

## 2022-01-02 NOTE — Transfer of Care (Signed)
Immediate Anesthesia Transfer of Care Note ? ?Patient: [name] ? ?Procedure(s) Performed: COLONOSCOPY WITH PROPOFOL ?POLYPECTOMY ? ?Patient Location: Short Stay ? ?Anesthesia Type:General ? ?Level of Consciousness: awake ? ?Airway & Oxygen Therapy: Patient Spontanous Breathing ? ?Post-op Assessment: Report given to RN and Post -op Vital signs reviewed and stable ? ?Post vital signs: Reviewed and stable ? ?Last Vitals:  ?Vitals Value Taken Time  ?BP    ?Temp    ?Pulse    ?Resp    ?SpO2    ? ? ?Last Pain:  ?Vitals:  ? 01/02/22 1016  ?TempSrc: Oral  ?PainSc: 0-No pain  ?   ? ?Patients Stated Pain Goal: 5 (01/02/22 1016) ? ?Complications: No notable events documented. ?

## 2022-01-02 NOTE — Op Note (Signed)
[facility] ?Patient Name: [name] ?Procedure Date: 01/02/2022 11:03 AM ?MRN: [id number] ?Date of Birth: 1934-12-10 ?Attending MD: Hildred Laser , MD ?CSN: [id number] ?Age: 87 ?Admit Type: Outpatient ?Procedure:                Colonoscopy ?Indications:              High risk colon cancer surveillance: Ulcerative  ?                          left sided colitis of 8 (or more) years duration ?Providers:                Hildred Laser, MD, Lurline Del, RN, Wynonia Musty  ?                          Tech, Technician ?Referring MD:             [name], MD ?Medicines:                Propofol per Anesthesia ?Complications:            No immediate complications. ?Estimated Blood Loss:     Estimated blood loss was minimal. ?Procedure:                Pre-Anesthesia Assessment: ?                          - Prior to the procedure, a History and Physical  ?                          was performed, and patient medications and  ?                          allergies were reviewed. The patient's tolerance of  ?                          previous anesthesia was also reviewed. The risks  ?                          and benefits of the procedure and the sedation  ?                          options and risks were discussed with the patient.  ?                          All questions were answered, and informed consent  ?                          was obtained. Prior Anticoagulants: The patient has  ?                          taken no previous anticoagulant or antiplatelet  ?                          agents except for aspirin. ASA Grade Assessment:  ?  III - A patient with severe systemic disease. After  ?                          reviewing the risks and benefits, the patient was  ?                          deemed in satisfactory condition to undergo the  ?                          procedure. ?                          After obtaining informed consent, the colonoscope  ?                          was passed  under direct vision. Throughout the  ?                          procedure, the patient's blood pressure, pulse, and  ?                          oxygen saturations were monitored continuously. The  ?                          PCF-HQ190L ([id number]) scope was introduced through  ?                          the anus and advanced to the the cecum, identified  ?                          by appendiceal orifice and ileocecal valve. The  ?                          colonoscopy was performed without difficulty. The  ?                          patient tolerated the procedure well. The quality  ?                          of the bowel preparation was adequate. The  ?                          ileocecal valve, appendiceal orifice, and rectum  ?                          were photographed. ?Scope In: 11:35:25 AM ?Scope Out: 12:07:59 PM ?Scope Withdrawal Time: 0 hours 26 minutes 41 seconds  ?Total Procedure Duration: 0 hours 32 minutes 34 seconds  ?Findings: ?     The perianal and digital rectal examinations were normal. ?     Two polyps were found in the rectum and cecum. The polyps were small in  ?     size. These polyps were removed with a cold snare. Resection and  ?     retrieval were complete. The pathology specimen was placed into Bottle  ?  Number 1. ?     A small polyp was found in the ascending colon. Biopsies were taken with  ?     a cold forceps for histology. The pathology specimen was placed into  ?     Bottle Number 1. ?     There is no endoscopic evidence of any other abnormality in the  ?     ascending colon. Biopsies were taken with a cold forceps for histology.  ?     The pathology specimen was placed into Bottle Number 2. ?     A 6 to 10 mm polyp was found in the mid sigmoid colon. Area was  ?     successfully injected with 3 mL Eleview for lesion assessment, and this  ?     injection appeared to lift the lesion adequately. The polyp was removed  ?     with a hot snare. Resection and retrieval were complete. The  pathology  ?     specimen was placed into Bottle Number 3. ?     Multiple small and large-mouthed diverticula were found in the sigmoid  ?     colon. ?     External hemorrhoids were found during retroflexion. The hemorrhoids  ?     were small. ?     A single small angiodysplastic lesion without bleeding was found in the  ?     cecum. ?Impression:               - Single small nonbleeding cecal AV malformation. ?                          - Two small polyps in the rectum and in the cecum,  ?                          removed with a cold snare. Resected and retrieved. ?                          - One small polyp in the ascending colon. Biopsied. ?                          - One 6 to 10 mm polyp in the mid sigmoid colon,  ?                          removed with a hot snare. Resected and retrieved.  ?                          Injected. ?                          - Diverticulosis in the sigmoid colon. ?                          - External hemorrhoids. ?Moderate Sedation: ?     Per Anesthesia Care ?Recommendation:           - Patient has a contact number available for  ?                          emergencies. The signs and symptoms of potential  ?  delayed complications were discussed with the  ?                          patient. Return to normal activities tomorrow.  ?                          Written discharge instructions were provided to the  ?                          patient. ?                          - High fiber diet and diabetic (ADA) diet today. ?                          - Continue present medications. ?                          - Resume antiplatelet medication at prior dose in 3  ?                          days. ?                          - Await pathology results. ?                          - No recommendation at this time regarding repeat  ?                          colonoscopy. ?Procedure Code(s):        --- Professional --- ?                          [phone], Colonoscopy, flexible; with  removal of  ?                          tumor(s), polyp(s), or other lesion(s) by snare  ?                          technique ?                          45380, 59, Colonoscopy, flexible; with biopsy,  ?                          single or multiple ?                          45381, Colonoscopy, flexible; with directed  ?                          submucosal injection(s), any substance ?Diagnosis Code(s):        --- Professional --- ?                          K51.50, Left sided colitis without complications ?  K62.1, Rectal polyp ?                          K63.5, Polyp of colon ?                          K64.4, Residual hemorrhoidal skin tags ?                          K57.30, Diverticulosis of large intestine without  ?                          perforation or abscess without bleeding ?CPT copyright 2019 American Medical Association. All rights reserved. ?The codes documented in this report are preliminary and upon coder review may  ?be revised to meet current compliance requirements. ?Hildred Laser, MD ?Hildred Laser, MD ?01/02/2022 12:20:42 PM ?This report has been signed electronically. ?Number of Addenda: 0 ?

## 2022-01-02 NOTE — H&P (Signed)
[name] is an 87 y.o. male.   ?Chief Complaint: Patient is here for colonoscopy. ?HPI: Patient is 87 year old Caucasian male who has history of distal ulcerative colitis of several years duration who has been in remission off therapy.  He also has a history of mantle cell lymphoma of the colon in December 2014 for which she received chemotherapy and has remained in remission.  His last colonoscopy was in November 2016. ?He denies abdominal pain diarrhea or rectal bleeding. ?Family history is negative for colon cancer. ? ?Past Medical History:  ?Diagnosis Date  ? B12 deficiency 02/07/2014  ? Carotid artery plaque 10/17/2021  ? Carotid US 10/12/2021: Bilateral carotid bifurcation plaque, <50% ICA stenosis  ? Mantle cell lymphoma of the colon [facility])   ? Coronary atherosclerosis of native coronary artery   ? 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 Essential hypertension, benign   ? GERD (gastroesophageal reflux disease)   ? Mixed hyperlipidemia   ? Obstructive sleep apnea   ? does not use  ? Prostate cancer ([place])   ? PVD (peripheral vascular disease) ([place])   ? Type 2 diabetes mellitus ([place])   ?     Chronic ulcerative colitis. ? ?Past Surgical History:  ?Procedure Laterality Date  ? BACK SURGERY    ? BALLOON DILATION N/A 08/27/2013  ? Procedure: BALLOON DILATION;  Surgeon: Rogene Houston, MD;  [facility];  Service: Endoscopy;  Laterality: N/A;  ? COLON SURGERY    ? COLONOSCOPY N/A 12/17/2013  ? Procedure: COLONOSCOPY;  Surgeon: Rogene Houston, MD;  [facility]: [facility];  Service: Endoscopy;  Laterality: N/A;  940  ? COLONOSCOPY N/A 02/28/2014  ? Procedure: COLONOSCOPY;  Surgeon: Rogene Houston, MD;  [facility];  Service: Endoscopy;  Laterality: N/A;  730  ? COLONOSCOPY N/A 08/04/2015  ? Procedure: COLONOSCOPY;  Surgeon: Rogene Houston, MD;  [facility]: [facility];  Service: Endoscopy;  Laterality: N/A;  200 - moved to 11/11 @ 2:10 - Ann to  notify pt  ? COLONOSCOPY WITH ESOPHAGOGASTRODUODENOSCOPY (EGD) N/A 08/27/2013  ? Procedure: COLONOSCOPY WITH ESOPHAGOGASTRODUODENOSCOPY (EGD);  Surgeon: Rogene Houston, MD;  [facility];  Service: Endoscopy;  Laterality: N/A;  730  ? CYSTOSCOPY N/A 06/09/2021  ? Procedure: CYSTOSCOPY;  Surgeon: Lucas Mallow, MD;  Location: [facility];  Service: Urology;  Laterality: N/A;  ? IR REMOVAL TUN ACCESS W/ PORT W/O FL MOD SED  07/17/2018  ? KNEE ARTHROSCOPY Left   ? KNEE SURGERY Right   ? total knee  ? MALONEY DILATION N/A 08/27/2013  ? Procedure: MALONEY DILATION;  Surgeon: Rogene Houston, MD;  [facility];  Service: Endoscopy;  Laterality: N/A;  ? PORTACATH PLACEMENT Right 09/23/2012  ? PROSTATECTOMY    ? REMOVAL OF PENILE PROSTHESIS N/A 06/09/2021  ? Procedure: REMOVAL OF ARTIFICIAL URINARY SPHINCER; REMOVAL OF INFLATABLE PENILE PROSTHESIS;  Surgeon: Lucas Mallow, MD;  Location: [facility];  Service: Urology;  Laterality: N/A;  ? removal of port Right   ? SAVORY DILATION N/A 08/27/2013  ? Procedure: SAVORY DILATION;  Surgeon: Rogene Houston, MD;  [facility];  Service: Endoscopy;  Laterality: N/A;  ? SHOULDER SURGERY    ? TONSILLECTOMY    ? URINARY SPHINCTER IMPLANT N/A 01/23/2017  ? artificial urinary sphincter implant by Dr. Odis Luster  ? VASECTOMY    ? ? ?Family History  ?Problem Relation Age  of Onset  ? Colon cancer Brother   ? Cancer Brother   ? Diabetes Father   ? Cancer Brother   ? ?Social History:  reports that he has never smoked. He has never used smokeless tobacco. He reports that he does not drink alcohol and does not use drugs. ? ?Allergies:  ?Allergies  ?Allergen Reactions  ? Bee Venom Anaphylaxis  ? Adhesive [Tape] Hives  ? Ciprofloxacin   ?  Pt does not remember reaction   ? Codeine   ?  Pt does not remember reaction   ? Iodine   ?  Pt does not remember reaction  ?  ? Povidone-Iodine   ?  Pt does not remember reaction   ? Simvastatin   ?  Pt does not remember  reaction   ? Latex Rash  ? ? ?Medications Prior to Admission  ?Medication Sig Dispense Refill  ? acetaminophen (TYLENOL) 325 MG tablet Take 650 mg by mouth every 6 (six) hours as needed for mild pain or moderate pain.    ? albuterol (PROAIR HFA) 108 (90 Base) MCG/ACT inhaler Inhale 2 puffs into the lungs at bedtime as needed for wheezing or shortness of breath. 18 g 1  ? amLODipine (NORVASC) 5 MG tablet Take 1 tablet (5 mg total) by mouth daily. 90 tablet 3  ? aspirin EC 81 MG tablet Take 81 mg by mouth daily. Swallow whole.    ? atorvastatin (LIPITOR) 40 MG tablet Take 40 mg by mouth daily.    ? budesonide-formoterol (SYMBICORT) 160-4.5 MCG/ACT inhaler Inhale 2 puffs into the lungs 2 (two) times daily. (Patient taking differently: Inhale 2 puffs into the lungs 2 (two) times daily as needed (asthma).) 1 each 11  ? fluticasone (FLONASE) 50 MCG/ACT nasal spray Place 1 spray into both nostrils as needed for allergies or rhinitis.    ? LORazepam (ATIVAN) 0.5 MG tablet Take 0.5 mg by mouth at bedtime as needed for sleep.    ? meclizine (ANTIVERT) 25 MG tablet Take 1 tablet (25 mg total) by mouth 3 (three) times daily as needed for dizziness. 30 tablet 0  ? metFORMIN (GLUCOPHAGE-XR) 750 MG 24 hr tablet Take 750 mg by mouth daily.    ? pantoprazole (PROTONIX) 40 MG tablet Take 40 mg by mouth 2 (two) times daily before a meal.    ? propranolol ER (INDERAL LA) 80 MG 24 hr capsule Take 1 capsule (80 mg total) by mouth at bedtime. 30 capsule 0  ? testosterone cypionate (DEPOTESTOSTERONE CYPIONATE) 200 MG/ML injection Inject 200 mg into the muscle every 14 (fourteen) days.     ? TOUJEO SOLOSTAR 300 UNIT/ML Solostar Pen Inject 30 Units into the skin 2 (two) times daily. (Patient taking differently: Inject 30-60 Units into the skin every morning. If sugar is high, use 60 units)    ? blood glucose meter kit and supplies KIT Dispense based on patient and insurance preference. Use at least three times a day to check BS levels and  fluctuation. 1 each 0  ? cyclobenzaprine (FLEXERIL) 5 MG tablet Take 1 tablet (5 mg total) by mouth 2 (two) times daily as needed for muscle spasms. (Patient not taking: Reported on 12/26/2021) 30 tablet 0  ? gabapentin (NEURONTIN) 300 MG capsule Take 1 cap in AM and 2 caps at night (300AM, 600PM) (Patient not taking: Reported on 12/26/2021) 270 capsule 3  ? ? ?Results for orders placed or performed during the hospital encounter of 01/02/22 (from the past 48 hour(s))  ?  Glucose, capillary     Status: None  ? Collection Time: 01/02/22 10:16 AM  ?Result Value Ref Range  ? Glucose-Capillary 88 70 - 99 mg/dL  ?  Comment: Glucose reference range applies only to samples taken after fasting for at least 8 hours.  ? ?No results found. ? ?Review of Systems ? ?Blood pressure 135/85, pulse 83, temperature (!) 97.5 ?F (36.4 ?C), temperature source Oral, resp. rate 18, SpO2 99 %. ?Physical Exam ?HENT:  ?   Mouth/Throat:  ?   Mouth: Mucous membranes are moist.  ?   Pharynx: Oropharynx is clear.  ?Eyes:  ?   General: No scleral icterus. ?   Conjunctiva/sclera: Conjunctivae normal.  ?Cardiovascular:  ?   Rate and Rhythm: Normal rate and regular rhythm.  ?   Heart sounds: Normal heart sounds. No murmur heard. ?Pulmonary:  ?   Effort: Pulmonary effort is normal.  ?   Breath sounds: Normal breath sounds.  ?Abdominal:  ?   General: There is no distension.  ?   Palpations: Abdomen is soft. There is no mass.  ?   Tenderness: There is no abdominal tenderness.  ?Musculoskeletal:     ?   General: No swelling.  ?   Cervical back: Neck supple.  ?Lymphadenopathy:  ?   Cervical: No cervical adenopathy.  ?Skin: ?   General: Skin is warm and dry.  ?Neurological:  ?   Mental Status: He is alert.  ?  ? ?Assessment/Plan ? ?Chronic ulcerative colitis ?History of mantle cell lymphoma of the colon ?Surveillance colonoscopy. ? ?Hildred Laser, MD ?01/02/2022, 11:25 AM ? ? ? ?

## 2022-01-02 NOTE — Anesthesia Preprocedure Evaluation (Signed)
Anesthesia Evaluation  ?Patient identified by MRN, date of birth, ID band ?Patient awake ? ? ? ?Reviewed: ?Allergy & Precautions, NPO status , Patient's Chart, lab work & pertinent test results, reviewed documented beta blocker date and time  ? ?Airway ?Mallampati: III ? ?TM Distance: >3 FB ?Neck ROM: Full ? ? ?Comment: Cervical radiculopathy Dental ? ?(+) Dental Advisory Given, Teeth Intact ?  ?Pulmonary ?asthma , sleep apnea ,  ?  ?Pulmonary exam normal ?breath sounds clear to auscultation ? ? ? ? ? ? Cardiovascular ?hypertension, Pt. on home beta blockers and Pt. on medications ?+ CAD and + Peripheral Vascular Disease  ?Normal cardiovascular exam ?Rhythm:Regular Rate:Normal ? ? ?  ?Neuro/Psych ? Neuromuscular disease negative psych ROS  ? GI/Hepatic ?Neg liver ROS, GERD  Medicated,  ?Endo/Other  ?diabetes, Well Controlled, Type 2, Oral Hypoglycemic Agents ? Renal/GU ?Renal disease  ?negative genitourinary ?  ?Musculoskeletal ?negative musculoskeletal ROS ?(+)  ? Abdominal ?  ?Peds ?negative pediatric ROS ?(+)  Hematology ? ?(+) Blood dyscrasia, anemia ,   ?Anesthesia Other Findings ? ? Reproductive/Obstetrics ?negative OB ROS ? ?  ? ? ? ? ? ? ? ? ? ? ? ? ? ?  ?  ? ? ? ? ? ? ? ? ?Anesthesia Physical ?Anesthesia Plan ? ?ASA: 3 ? ?Anesthesia Plan: General  ? ?Post-op Pain Management: Minimal or no pain anticipated  ? ?Induction: Intravenous ? ?PONV Risk Score and Plan: Propofol infusion ? ?Airway Management Planned: Nasal Cannula and Natural Airway ? ?Additional Equipment:  ? ?Intra-op Plan:  ? ?Post-operative Plan:  ? ?Informed Consent: I have reviewed the patients History and Physical, chart, labs and discussed the procedure including the risks, benefits and alternatives for the proposed anesthesia with the patient or authorized representative who has indicated his/her understanding and acceptance.  ? ? ? ?Dental advisory given ? ?Plan Discussed with: CRNA and  Surgeon ? ?Anesthesia Plan Comments:   ? ? ? ? ? ? ?Anesthesia Quick Evaluation ? ?

## 2022-01-02 NOTE — Anesthesia Postprocedure Evaluation (Signed)
Anesthesia Post Note ? ?Patient: [name] ? ?Procedure(s) Performed: COLONOSCOPY WITH PROPOFOL ?POLYPECTOMY ? ?Patient location during evaluation: Phase II ?Anesthesia Type: General ?Level of consciousness: awake and alert and oriented ?Pain management: pain level controlled ?Vital Signs Assessment: post-procedure vital signs reviewed and stable ?Respiratory status: spontaneous breathing, nonlabored ventilation and respiratory function stable ?Cardiovascular status: blood pressure returned to baseline and stable ?Postop Assessment: no apparent nausea or vomiting ?Anesthetic complications: no ? ? ?No notable events documented. ? ? ?Last Vitals:  ?Vitals:  ? 01/02/22 1213 01/02/22 1215  ?BP: (!) 76/50 102/68  ?Pulse:  82  ?Resp:  (!) 22  ?Temp:  36.6 ?C  ?SpO2:  100%  ?  ?Last Pain:  ?Vitals:  ? 01/02/22 1217  ?TempSrc:   ?PainSc: 0-No pain  ? ? ?  ?  ?  ?  ?  ?  ? ?Aniya Jolicoeur C Morenike Cuff ? ? ? ? ?

## 2022-01-02 NOTE — Discharge Instructions (Signed)
Resume aspirin on 01/05/2022 ?Resume other medications as before ?Modified carb high-fiber diet ?No driving for 24 hours ?Physician will call with biopsy results. ?

## 2022-01-03 LAB — SURGICAL PATHOLOGY

## 2022-01-04 ENCOUNTER — Encounter (HOSPITAL_COMMUNITY): Payer: Self-pay | Admitting: Internal Medicine

## 2022-01-10 ENCOUNTER — Telehealth: Payer: Self-pay | Admitting: Physician Assistant

## 2022-01-10 NOTE — Telephone Encounter (Signed)
Received BP readings from home. ?BPs are above goal. ?PLAN:  ?Increase Amlodipine from 5 mg to 10 mg once daily. ?Richardson Dopp, PA-C    ?01/10/2022 4:53 PM   ?

## 2022-01-11 DIAGNOSIS — N281 Cyst of kidney, acquired: Secondary | ICD-10-CM | POA: Diagnosis not present

## 2022-01-11 DIAGNOSIS — N35013 Post-traumatic anterior urethral stricture: Secondary | ICD-10-CM | POA: Diagnosis not present

## 2022-01-11 DIAGNOSIS — Z87442 Personal history of urinary calculi: Secondary | ICD-10-CM | POA: Diagnosis not present

## 2022-01-11 MED ORDER — AMLODIPINE BESYLATE 10 MG PO TABS
10.0000 mg | ORAL_TABLET | Freq: Every day | ORAL | 3 refills | Status: DC
Start: 2022-01-11 — End: 2023-01-06

## 2022-01-11 NOTE — Telephone Encounter (Signed)
Pt has been made aware that we have received his blood pressure readings and to increase the Amlodipine to 10 mg daily.   ? ?Prescription sent to [facility]. ?

## 2022-01-15 DIAGNOSIS — J449 Chronic obstructive pulmonary disease, unspecified: Secondary | ICD-10-CM | POA: Diagnosis not present

## 2022-01-15 DIAGNOSIS — C8310 Mantle cell lymphoma, unspecified site: Secondary | ICD-10-CM | POA: Diagnosis not present

## 2022-01-15 DIAGNOSIS — I2510 Atherosclerotic heart disease of native coronary artery without angina pectoris: Secondary | ICD-10-CM | POA: Diagnosis not present

## 2022-01-15 DIAGNOSIS — K5190 Ulcerative colitis, unspecified, without complications: Secondary | ICD-10-CM | POA: Diagnosis not present

## 2022-01-15 DIAGNOSIS — J069 Acute upper respiratory infection, unspecified: Secondary | ICD-10-CM | POA: Diagnosis not present

## 2022-01-15 DIAGNOSIS — E1122 Type 2 diabetes mellitus with diabetic chronic kidney disease: Secondary | ICD-10-CM | POA: Diagnosis not present

## 2022-01-15 DIAGNOSIS — R55 Syncope and collapse: Secondary | ICD-10-CM | POA: Diagnosis not present

## 2022-01-29 DIAGNOSIS — I2510 Atherosclerotic heart disease of native coronary artery without angina pectoris: Secondary | ICD-10-CM | POA: Diagnosis not present

## 2022-01-29 DIAGNOSIS — E1122 Type 2 diabetes mellitus with diabetic chronic kidney disease: Secondary | ICD-10-CM | POA: Diagnosis not present

## 2022-02-04 ENCOUNTER — Other Ambulatory Visit: Payer: Self-pay | Admitting: Adult Health

## 2022-02-05 DIAGNOSIS — I2510 Atherosclerotic heart disease of native coronary artery without angina pectoris: Secondary | ICD-10-CM | POA: Diagnosis not present

## 2022-02-05 DIAGNOSIS — G4701 Insomnia due to medical condition: Secondary | ICD-10-CM | POA: Diagnosis not present

## 2022-02-05 DIAGNOSIS — E1122 Type 2 diabetes mellitus with diabetic chronic kidney disease: Secondary | ICD-10-CM | POA: Diagnosis not present

## 2022-02-05 DIAGNOSIS — J449 Chronic obstructive pulmonary disease, unspecified: Secondary | ICD-10-CM | POA: Diagnosis not present

## 2022-02-05 DIAGNOSIS — K5190 Ulcerative colitis, unspecified, without complications: Secondary | ICD-10-CM | POA: Diagnosis not present

## 2022-02-05 DIAGNOSIS — L259 Unspecified contact dermatitis, unspecified cause: Secondary | ICD-10-CM | POA: Diagnosis not present

## 2022-02-05 DIAGNOSIS — R55 Syncope and collapse: Secondary | ICD-10-CM | POA: Diagnosis not present

## 2022-02-05 DIAGNOSIS — C8310 Mantle cell lymphoma, unspecified site: Secondary | ICD-10-CM | POA: Diagnosis not present

## 2022-02-06 ENCOUNTER — Telehealth: Payer: Self-pay | Admitting: Physician Assistant

## 2022-02-06 NOTE — Telephone Encounter (Signed)
Reviewed recent blood pressures obtained by patient and sent by mail. ?Most blood pressures obtained are at goal. ?PLAN:   ?Continue current medications ?Richardson Dopp, PA-C ?02/06/2022 5:37 PM  ?

## 2022-02-07 ENCOUNTER — Encounter: Payer: Self-pay | Admitting: *Deleted

## 2022-02-07 NOTE — Telephone Encounter (Signed)
Call placed to pt regarding message below, left a message for pt to call back. Will also send pt a mychart message.

## 2022-02-07 NOTE — Telephone Encounter (Signed)
Pt returned my call and he has been made aware that his blood pressure's were mainly at goal and to continue his current medications.  Pt verbalized understanding.

## 2022-02-11 ENCOUNTER — Telehealth: Payer: Self-pay | Admitting: *Deleted

## 2022-02-11 DIAGNOSIS — N1831 Chronic kidney disease, stage 3a: Secondary | ICD-10-CM | POA: Diagnosis not present

## 2022-02-11 NOTE — Telephone Encounter (Signed)
Pt has appt 04/12/22 with Richardson Dopp, PAC. Will forward. [place] to requesting office pt has appt 04/12/22.

## 2022-02-11 NOTE — Telephone Encounter (Signed)
   Pre-operative Risk Assessment    Patient Name: [name]  DOB: Jun 30, 1935 MRN: [id number]      Request for Surgical Clearance    Procedure:   ARTIFICIAL URINARY SPHITTER PLACEMENT   Date of Surgery:  Clearance 05/13/22                                 Surgeon:  Lenoria Chime, MD Surgeon's Group or Practice Name:   [facility] Phone number:  [phone] Fax number:  [phone]   Type of Clearance Requested:   - Pharmacy:  Hold Aspirin X'S 10 DAYS PER REQUEST   Type of Anesthesia:  Not Indicated   Additional requests/questions:    [name]   02/11/2022, 12:18 PM

## 2022-02-11 NOTE — Telephone Encounter (Signed)
Primary Cardiologist:Heather Renae Fickle, MD  Chart reviewed as part of pre-operative protocol coverage. Because of [name] past medical history and time since last visit, he/she will require a follow-up visit in order to better assess preoperative cardiovascular risk.  Pre-op covering staff: - Patient has appointment with Richardson Dopp, PA on 04/12/22. Clearance will be addressed at that visit.  - Please contact requesting surgeon's office via preferred method (i.e, phone, fax) to inform them of need for appointment prior to surgery.  If applicable, this message will also be routed to pharmacy pool and/or primary cardiologist for input on holding anticoagulant/antiplatelet agent as requested below so that this information is available at time of patient's appointment.   Emmaline Life, NP-C    02/11/2022, 2:12 PM [place] [address], [address] Office [phone] Fax [phone]

## 2022-03-05 ENCOUNTER — Other Ambulatory Visit: Payer: Self-pay | Admitting: Adult Health

## 2022-03-13 DIAGNOSIS — E1122 Type 2 diabetes mellitus with diabetic chronic kidney disease: Secondary | ICD-10-CM | POA: Diagnosis not present

## 2022-03-18 DIAGNOSIS — E119 Type 2 diabetes mellitus without complications: Secondary | ICD-10-CM | POA: Diagnosis not present

## 2022-03-27 DIAGNOSIS — E119 Type 2 diabetes mellitus without complications: Secondary | ICD-10-CM | POA: Diagnosis not present

## 2022-04-05 DIAGNOSIS — E1165 Type 2 diabetes mellitus with hyperglycemia: Secondary | ICD-10-CM | POA: Diagnosis not present

## 2022-04-11 DIAGNOSIS — Z01810 Encounter for preprocedural cardiovascular examination: Secondary | ICD-10-CM | POA: Insufficient documentation

## 2022-04-11 NOTE — Progress Notes (Signed)
Cardiology Office Note:    Date:  04/12/2022   ID:  [name], DOB 07/24/1935, MRN [id number]  PCP:  Celene Squibb, MD  [facility] Cardiologist:  Freada Bergeron, MD Cardiology APP:  [facility]    Referring MD: Celene Squibb, MD   Chief Complaint:  Follow-up for CAD, hypertension; surgical clearance    Patient Profile: Coronary artery disease  Mild non-obs dz by cath in 2008 Cath 2011: no sig CAD; LAD myocardial bridge  Hypertension  Hyperlipidemia  Peripheral arterial disease  Chronic kidney disease Diabetes mellitus  Aortic atherosclerosis  OSA Colon CA GERD Prostate CA  Prior CV Studies: Echo 10/12/2021: EF 50-55, no RWMA, moderate LVH, G1 DD, normal RVSF, trivial MR, mild AI Carotid US 10/12/2021: Bilateral carotid bifurcation plaque, <50% ICA stenosis LHC 12/01/2009: EF normal //LAD mid myocardial bridge // LCx intermediate branch ostial 40 Cardiac catheterization 12/31/2006: LM patent //LAD mid plaque versus myocardial bridge (50%)   History of Present Illness:   [name] is a 87 y.o. male with the above problem list.  He was last seen in January 2023.  He was having issues with dizziness and blood pressure at that time.  We adjusted his medications with better blood pressure control.  He returns for surgical clearance.  He needs urologic surgery with Dr. Odis Luster at [facility] on May 13, 2022.  Type of anesthesia is unknown.  He is here alone.  Overall, he is doing well.  He has had much less episodes of dizziness.  This seems to be improving.  He has not had chest pain, shortness of breath, syncope, orthopnea, leg edema.  He is able to do yard work and housework without difficulty.  He can go up and down steps and walk couple of blocks.        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   albuterol (PROAIR HFA) 108 (90 Base) MCG/ACT inhaler Inhale 2 puffs into the lungs at bedtime as needed for wheezing or shortness of breath.   amLODipine (NORVASC) 10 MG tablet Take 1 tablet (10 mg total) by mouth daily.   aspirin EC 81 MG tablet Take 1 tablet (81 mg total) by mouth daily. Swallow whole.   atorvastatin (LIPITOR) 40 MG tablet Take 40 mg by mouth daily.   blood glucose meter kit and supplies KIT Dispense based on patient and insurance preference. Use at least three times a day to check BS levels and fluctuation.   budesonide-formoterol (SYMBICORT) 160-4.5 MCG/ACT inhaler Inhale 2 puffs into the lungs 2 (two) times daily.   cyclobenzaprine (FLEXERIL) 5 MG tablet Take 1 tablet (5 mg total) by mouth 2 (two)  times daily as needed for muscle spasms.   fluticasone (FLONASE) 50 MCG/ACT nasal spray Place 1 spray into both nostrils as needed for allergies or rhinitis.   insulin aspart (NOVOLOG) 100 UNIT/ML injection Inject 20 Units into the skin 3 (three) times daily before meals.   LORazepam (ATIVAN) 0.5 MG tablet Take 0.5 mg by mouth at bedtime as needed for sleep.   meclizine (ANTIVERT) 25 MG tablet Take 1 tablet (25 mg total) by mouth 3 (three) times daily as needed for dizziness.   metFORMIN (GLUCOPHAGE-XR) 750 MG 24 hr tablet Take 750 mg by mouth daily.   pantoprazole (PROTONIX) 40 MG tablet Take 1  tablet (40 mg total) by mouth 2 (two) times daily before a meal.   propranolol ER (INDERAL LA) 80 MG 24 hr capsule Take 1 capsule (80 mg total) by mouth at bedtime.   rosuvastatin (CRESTOR) 40 MG tablet Take 40 mg by mouth daily.   testosterone cypionate (DEPOTESTOSTERONE CYPIONATE) 200 MG/ML injection Inject 200 mg into the muscle every 14 (fourteen) days.    TOUJEO SOLOSTAR 300 UNIT/ML Solostar Pen Inject 30 Units into the skin 2 (two) times daily.   [DISCONTINUED] rosuvastatin (CRESTOR) 40 MG tablet Take 40 mg by mouth daily.      Allergies:   Bee venom, Adhesive [tape], Ciprofloxacin, Codeine, Iodine, Povidone-iodine, Simvastatin, and Latex   Social History   Tobacco Use   Smoking status: Never   Smokeless tobacco: Never  Vaping Use   Vaping Use: Never used  Substance Use Topics   Alcohol use: No   Drug use: No    Family Hx: The patient's family history includes Cancer in his brother and brother; Colon cancer in his brother; Diabetes in his father.  Review of Systems  Musculoskeletal:  Positive for neck pain.     EKGs/Labs/Other Test Reviewed:    EKG:  EKG is  ordered today.  The ekg ordered today demonstrates NSR, HR 87, left axis deviation, PACs, QTc 423, similar to prior tracings  Recent Labs: 08/28/2021: B Natriuretic Peptide 134.0 09/03/2021: TSH 4.963 10/11/2021: Magnesium 1.4 10/14/2021: ALT 16; Hemoglobin 14.2; Platelets 147 10/15/2021: BUN 45; Creatinine, Ser 1.62; Potassium 4.3; Sodium 137   Recent Lipid Panel Recent Labs    10/12/21 0436  CHOL 123  TRIG 103  HDL 37*  VLDL 21  LDLCALC 65     Risk Assessment/Calculations/Metrics:              Physical Exam:    VS:  BP 118/64   Pulse 87   Ht 5' 11"  (1.803 m)   Wt 216 lb 9.6 oz (98.2 kg)   SpO2 94%   BMI 30.21 kg/m     Wt Readings from Last 3 Encounters:  04/12/22 216 lb 9.6 oz (98.2 kg)  12/31/21 200 lb (90.7 kg)  10/22/21 210 lb 9.6 oz (95.5 kg)    Constitutional:      Appearance: Healthy  appearance. Not in distress.  Neck:     Vascular: No carotid bruit or JVR. JVD normal.  Pulmonary:     Effort: Pulmonary effort is normal.     Breath sounds: No wheezing. No rales.  Cardiovascular:     Normal rate. Regularly irregular rhythm. Normal S1. Normal S2.      Murmurs: There is no murmur.  Edema:    Peripheral edema absent.  Abdominal:     Palpations: Abdomen is soft.  Skin:    General: Skin is warm and dry.  Neurological:  General: No focal deficit present.     Mental Status: Alert and oriented to person, place and time.         ASSESSMENT & PLAN:   Preoperative cardiovascular examination Mr. Negron perioperative risk of a major cardiac event is 0.9% according to the Revised Cardiac Risk Index (RCRI).  Therefore, he is at low risk for perioperative complications.   His functional capacity is good at 4.31 METs according to the Duke Activity Status Index (DASI). Recommendations: According to ACC/AHA guidelines, no further cardiovascular testing needed.  The patient may proceed to surgery at acceptable risk.   Antiplatelet and/or Anticoagulation Recommendations: Aspirin can be held for 7 days prior to his surgery.  Please resume Aspirin post operatively when it is felt to be safe from a bleeding standpoint.    CAD (coronary artery disease) Mild nonobstructive disease by prior cardiac catheterization.  He is doing well without anginal symptoms.  Continue aspirin 81 mg daily, atorvastatin 40 mg daily.  Mixed hyperlipidemia Recent lipids from 01/29/2022 reviewed (KPN tool).  LDL was elevated at 78.  Previously, LDL was 65.  Continue on current medical regimen with atorvastatin 40 mg daily.  Hypertension Blood pressure is well controlled.  Continue amlodipine 10 mg daily, propranolol 80 mg daily.  Aortic atherosclerosis (HCC) Continue aspirin, statin therapy  Carotid artery plaque Mild nonobstructive disease by ultrasound in January 2023.  He had no significant  vertebral artery stenosis on the study as well.            Dispo:  Return in about 1 year (around 04/13/2023) for Routine Follow Up, w/ Richardson Dopp, PA-C.   Medication Adjustments/Labs and Tests Ordered: Current medicines are reviewed at length with the patient today.  Concerns regarding medicines are outlined above.  Tests Ordered: Orders Placed This Encounter  Procedures   EKG 12-Lead   Medication Changes: No orders of the defined types were placed in this encounter.  Signed, Richardson Dopp, PA-C  04/12/2022 8:28 AM    [facility] [place], [place], [place]  [address] Phone: [phone]; Fax: [phone]

## 2022-04-12 ENCOUNTER — Ambulatory Visit: Payer: Medicare Other | Admitting: Physician Assistant

## 2022-04-12 ENCOUNTER — Encounter: Payer: Self-pay | Admitting: Physician Assistant

## 2022-04-12 VITALS — BP 118/64 | HR 87 | Ht 71.0 in | Wt 216.6 lb

## 2022-04-12 DIAGNOSIS — Z01810 Encounter for preprocedural cardiovascular examination: Secondary | ICD-10-CM | POA: Diagnosis not present

## 2022-04-12 DIAGNOSIS — E782 Mixed hyperlipidemia: Secondary | ICD-10-CM

## 2022-04-12 DIAGNOSIS — I6523 Occlusion and stenosis of bilateral carotid arteries: Secondary | ICD-10-CM | POA: Diagnosis not present

## 2022-04-12 DIAGNOSIS — I2510 Atherosclerotic heart disease of native coronary artery without angina pectoris: Secondary | ICD-10-CM

## 2022-04-12 DIAGNOSIS — I10 Essential (primary) hypertension: Secondary | ICD-10-CM | POA: Diagnosis not present

## 2022-04-12 DIAGNOSIS — I700 Atherosclerosis of aorta: Secondary | ICD-10-CM

## 2022-04-12 NOTE — Assessment & Plan Note (Signed)
Continue aspirin, statin therapy

## 2022-04-12 NOTE — Assessment & Plan Note (Addendum)
Mr. [name] perioperative risk of a major cardiac event is 0.9% according to the Revised Cardiac Risk Index (RCRI).  Therefore, he is at low risk for perioperative complications.   His functional capacity is good at 4.31 METs according to the Duke Activity Status Index (DASI). Recommendations: According to ACC/AHA guidelines, no further cardiovascular testing needed.  The patient may proceed to surgery at acceptable risk.   Antiplatelet and/or Anticoagulation Recommendations: Aspirin can be held for 7 days prior to his surgery.  Please resume Aspirin post operatively when it is felt to be safe from a bleeding standpoint.

## 2022-04-12 NOTE — Assessment & Plan Note (Addendum)
Blood pressure is well controlled.  Continue amlodipine 10 mg daily, propranolol 80 mg daily.

## 2022-04-12 NOTE — Assessment & Plan Note (Signed)
Mild nonobstructive disease by prior cardiac catheterization.  He is doing well without anginal symptoms.  Continue aspirin 81 mg daily, atorvastatin 40 mg daily.

## 2022-04-12 NOTE — Assessment & Plan Note (Signed)
Recent lipids from 01/29/2022 reviewed (KPN tool).  LDL was elevated at 78.  Previously, LDL was 65.  Continue on current medical regimen with atorvastatin 40 mg daily.

## 2022-04-12 NOTE — Patient Instructions (Addendum)
Medication Instructions:  Your physician recommends that you continue on your current medications as directed. Please refer to the Current Medication list given to you today.  *If you need a refill on your cardiac medications before your next appointment, please call your pharmacy*   Lab Work: None ordered  If you have labs (blood work) drawn today and your tests are completely normal, you will receive your results only by: [facility] (if you have MyChart) OR A paper copy in the mail If you have any lab test that is abnormal or we need to change your treatment, we will call you to review the results.   Testing/Procedures: None ordered   Follow-Up: At [facility], you and your health needs are our priority.  As part of our continuing mission to provide you with exceptional heart care, we have created designated Provider Care Teams.  These Care Teams include your primary Cardiologist (physician) and Advanced Practice Providers (APPs -  Physician Assistants and Nurse Practitioners) who all work together to provide you with the care you need, when you need it.  We recommend signing up for the patient portal called "MyChart".  Sign up information is provided on this After Visit Summary.  MyChart is used to connect with patients for Virtual Visits (Telemedicine).  Patients are able to view lab/test results, encounter notes, upcoming appointments, etc.  Non-urgent messages can be sent to your provider as well.   To learn more about what you can do with MyChart, go to [email or web address].    Your next appointment:   1 year(s)  The format for your next appointment:   In Person  Provider:   Richardson Dopp, PA-C         Other Instructions You may hold your Aspirin for 7 days before your surgery.  We have moved Donna's appointment up to see Nicki Reaper, 05/06/22 arrive at 10:40  Important Information About Sugar

## 2022-04-12 NOTE — Assessment & Plan Note (Signed)
Mild nonobstructive disease by ultrasound in January 2023.  He had no significant vertebral artery stenosis on the study as well.

## 2022-04-22 DIAGNOSIS — E538 Deficiency of other specified B group vitamins: Secondary | ICD-10-CM | POA: Diagnosis not present

## 2022-04-22 DIAGNOSIS — N1831 Chronic kidney disease, stage 3a: Secondary | ICD-10-CM | POA: Diagnosis not present

## 2022-04-29 DIAGNOSIS — D696 Thrombocytopenia, unspecified: Secondary | ICD-10-CM | POA: Diagnosis not present

## 2022-04-29 DIAGNOSIS — R7309 Other abnormal glucose: Secondary | ICD-10-CM | POA: Diagnosis not present

## 2022-04-29 DIAGNOSIS — E119 Type 2 diabetes mellitus without complications: Secondary | ICD-10-CM | POA: Diagnosis not present

## 2022-04-30 ENCOUNTER — Telehealth: Payer: Self-pay | Admitting: Cardiology

## 2022-04-30 NOTE — Telephone Encounter (Signed)
Spoke with Benjie Karvonen at [facility] and I made her aware that I faxed over ov notes when pt so [employer], PA-C. Per Scott's ov note his said patient may hold for 7 days, Benjie Karvonen stated that Dr. Odis Luster prefers that his patients hold aspirin for 10 days instead of 7 days. Please advise.

## 2022-04-30 NOTE — Telephone Encounter (Signed)
I have made Angie from Urology aware of recommendations for ASA.

## 2022-04-30 NOTE — Telephone Encounter (Signed)
He may hold aspirin for 10 days for upcoming urological surgery.  Emmaline Life, NP-C    04/30/2022, 3:41 PM [place] [address] Office [phone] Fax [phone]

## 2022-04-30 NOTE — Telephone Encounter (Signed)
Angie from the preop clinic called from Dr. Earle Gell office wanting to know if it's okay to hold patient's aspirin for 10 days prior to his surgery on 8/21.

## 2022-05-05 DIAGNOSIS — E1165 Type 2 diabetes mellitus with hyperglycemia: Secondary | ICD-10-CM | POA: Diagnosis not present

## 2022-05-09 DIAGNOSIS — E1122 Type 2 diabetes mellitus with diabetic chronic kidney disease: Secondary | ICD-10-CM | POA: Diagnosis not present

## 2022-05-09 DIAGNOSIS — I2510 Atherosclerotic heart disease of native coronary artery without angina pectoris: Secondary | ICD-10-CM | POA: Diagnosis not present

## 2022-05-10 DIAGNOSIS — E1165 Type 2 diabetes mellitus with hyperglycemia: Secondary | ICD-10-CM | POA: Diagnosis not present

## 2022-05-16 DIAGNOSIS — J449 Chronic obstructive pulmonary disease, unspecified: Secondary | ICD-10-CM | POA: Diagnosis not present

## 2022-05-16 DIAGNOSIS — L259 Unspecified contact dermatitis, unspecified cause: Secondary | ICD-10-CM | POA: Diagnosis not present

## 2022-05-16 DIAGNOSIS — R809 Proteinuria, unspecified: Secondary | ICD-10-CM | POA: Diagnosis not present

## 2022-05-16 DIAGNOSIS — C8310 Mantle cell lymphoma, unspecified site: Secondary | ICD-10-CM | POA: Diagnosis not present

## 2022-05-16 DIAGNOSIS — R55 Syncope and collapse: Secondary | ICD-10-CM | POA: Diagnosis not present

## 2022-05-16 DIAGNOSIS — I2510 Atherosclerotic heart disease of native coronary artery without angina pectoris: Secondary | ICD-10-CM | POA: Diagnosis not present

## 2022-05-16 DIAGNOSIS — K5190 Ulcerative colitis, unspecified, without complications: Secondary | ICD-10-CM | POA: Diagnosis not present

## 2022-05-16 DIAGNOSIS — N390 Urinary tract infection, site not specified: Secondary | ICD-10-CM | POA: Diagnosis not present

## 2022-05-16 DIAGNOSIS — E1122 Type 2 diabetes mellitus with diabetic chronic kidney disease: Secondary | ICD-10-CM | POA: Diagnosis not present

## 2022-05-16 DIAGNOSIS — G4701 Insomnia due to medical condition: Secondary | ICD-10-CM | POA: Diagnosis not present

## 2022-05-23 DIAGNOSIS — E1122 Type 2 diabetes mellitus with diabetic chronic kidney disease: Secondary | ICD-10-CM | POA: Diagnosis not present

## 2022-05-28 DIAGNOSIS — Z23 Encounter for immunization: Secondary | ICD-10-CM | POA: Diagnosis not present

## 2022-06-04 DIAGNOSIS — R350 Frequency of micturition: Secondary | ICD-10-CM | POA: Diagnosis not present

## 2022-06-04 DIAGNOSIS — E1165 Type 2 diabetes mellitus with hyperglycemia: Secondary | ICD-10-CM | POA: Diagnosis not present

## 2022-06-04 DIAGNOSIS — R8271 Bacteriuria: Secondary | ICD-10-CM | POA: Diagnosis not present

## 2022-06-04 DIAGNOSIS — R3915 Urgency of urination: Secondary | ICD-10-CM | POA: Diagnosis not present

## 2022-06-07 DIAGNOSIS — N390 Urinary tract infection, site not specified: Secondary | ICD-10-CM | POA: Diagnosis not present

## 2022-06-10 DIAGNOSIS — E1165 Type 2 diabetes mellitus with hyperglycemia: Secondary | ICD-10-CM | POA: Diagnosis not present

## 2022-06-11 DIAGNOSIS — E1140 Type 2 diabetes mellitus with diabetic neuropathy, unspecified: Secondary | ICD-10-CM | POA: Diagnosis not present

## 2022-06-11 DIAGNOSIS — L03031 Cellulitis of right toe: Secondary | ICD-10-CM | POA: Diagnosis not present

## 2022-06-11 DIAGNOSIS — E1122 Type 2 diabetes mellitus with diabetic chronic kidney disease: Secondary | ICD-10-CM | POA: Diagnosis not present

## 2022-06-19 DIAGNOSIS — G4730 Sleep apnea, unspecified: Secondary | ICD-10-CM | POA: Diagnosis not present

## 2022-06-19 DIAGNOSIS — I129 Hypertensive chronic kidney disease with stage 1 through stage 4 chronic kidney disease, or unspecified chronic kidney disease: Secondary | ICD-10-CM | POA: Diagnosis not present

## 2022-06-19 DIAGNOSIS — I2510 Atherosclerotic heart disease of native coronary artery without angina pectoris: Secondary | ICD-10-CM | POA: Diagnosis not present

## 2022-06-19 DIAGNOSIS — Z7982 Long term (current) use of aspirin: Secondary | ICD-10-CM | POA: Diagnosis not present

## 2022-06-19 DIAGNOSIS — R251 Tremor, unspecified: Secondary | ICD-10-CM | POA: Diagnosis not present

## 2022-06-19 DIAGNOSIS — C189 Malignant neoplasm of colon, unspecified: Secondary | ICD-10-CM | POA: Diagnosis not present

## 2022-06-19 DIAGNOSIS — Z794 Long term (current) use of insulin: Secondary | ICD-10-CM | POA: Diagnosis not present

## 2022-06-19 DIAGNOSIS — I6529 Occlusion and stenosis of unspecified carotid artery: Secondary | ICD-10-CM | POA: Diagnosis not present

## 2022-06-19 DIAGNOSIS — I6782 Cerebral ischemia: Secondary | ICD-10-CM | POA: Diagnosis not present

## 2022-06-19 DIAGNOSIS — E1122 Type 2 diabetes mellitus with diabetic chronic kidney disease: Secondary | ICD-10-CM | POA: Diagnosis not present

## 2022-06-19 DIAGNOSIS — N183 Chronic kidney disease, stage 3 unspecified: Secondary | ICD-10-CM | POA: Diagnosis not present

## 2022-06-20 DIAGNOSIS — Z794 Long term (current) use of insulin: Secondary | ICD-10-CM | POA: Diagnosis not present

## 2022-06-20 DIAGNOSIS — I6529 Occlusion and stenosis of unspecified carotid artery: Secondary | ICD-10-CM | POA: Diagnosis not present

## 2022-06-20 DIAGNOSIS — N183 Chronic kidney disease, stage 3 unspecified: Secondary | ICD-10-CM | POA: Diagnosis not present

## 2022-06-20 DIAGNOSIS — I2510 Atherosclerotic heart disease of native coronary artery without angina pectoris: Secondary | ICD-10-CM | POA: Diagnosis not present

## 2022-06-20 DIAGNOSIS — E1122 Type 2 diabetes mellitus with diabetic chronic kidney disease: Secondary | ICD-10-CM | POA: Diagnosis not present

## 2022-06-20 DIAGNOSIS — C189 Malignant neoplasm of colon, unspecified: Secondary | ICD-10-CM | POA: Diagnosis not present

## 2022-06-20 DIAGNOSIS — I6782 Cerebral ischemia: Secondary | ICD-10-CM | POA: Diagnosis not present

## 2022-06-20 DIAGNOSIS — Z7982 Long term (current) use of aspirin: Secondary | ICD-10-CM | POA: Diagnosis not present

## 2022-06-20 DIAGNOSIS — I129 Hypertensive chronic kidney disease with stage 1 through stage 4 chronic kidney disease, or unspecified chronic kidney disease: Secondary | ICD-10-CM | POA: Diagnosis not present

## 2022-06-20 DIAGNOSIS — R251 Tremor, unspecified: Secondary | ICD-10-CM | POA: Diagnosis not present

## 2022-06-20 DIAGNOSIS — G4730 Sleep apnea, unspecified: Secondary | ICD-10-CM | POA: Diagnosis not present

## 2022-06-25 DIAGNOSIS — E1122 Type 2 diabetes mellitus with diabetic chronic kidney disease: Secondary | ICD-10-CM | POA: Diagnosis not present

## 2022-07-09 DIAGNOSIS — E1122 Type 2 diabetes mellitus with diabetic chronic kidney disease: Secondary | ICD-10-CM | POA: Diagnosis not present

## 2022-07-10 DIAGNOSIS — E1165 Type 2 diabetes mellitus with hyperglycemia: Secondary | ICD-10-CM | POA: Diagnosis not present

## 2022-07-12 DIAGNOSIS — E1165 Type 2 diabetes mellitus with hyperglycemia: Secondary | ICD-10-CM | POA: Diagnosis not present

## 2022-07-22 DIAGNOSIS — E113293 Type 2 diabetes mellitus with mild nonproliferative diabetic retinopathy without macular edema, bilateral: Secondary | ICD-10-CM | POA: Diagnosis not present

## 2022-08-05 DIAGNOSIS — E119 Type 2 diabetes mellitus without complications: Secondary | ICD-10-CM | POA: Diagnosis not present

## 2022-08-09 DIAGNOSIS — E1140 Type 2 diabetes mellitus with diabetic neuropathy, unspecified: Secondary | ICD-10-CM | POA: Diagnosis not present

## 2022-08-09 DIAGNOSIS — E1122 Type 2 diabetes mellitus with diabetic chronic kidney disease: Secondary | ICD-10-CM | POA: Diagnosis not present

## 2022-08-09 DIAGNOSIS — L84 Corns and callosities: Secondary | ICD-10-CM | POA: Diagnosis not present

## 2022-08-10 DIAGNOSIS — E1165 Type 2 diabetes mellitus with hyperglycemia: Secondary | ICD-10-CM | POA: Diagnosis not present

## 2022-08-11 DIAGNOSIS — E1165 Type 2 diabetes mellitus with hyperglycemia: Secondary | ICD-10-CM | POA: Diagnosis not present

## 2022-08-14 DIAGNOSIS — M79672 Pain in left foot: Secondary | ICD-10-CM | POA: Diagnosis not present

## 2022-08-14 DIAGNOSIS — L600 Ingrowing nail: Secondary | ICD-10-CM | POA: Diagnosis not present

## 2022-08-14 DIAGNOSIS — L110 Acquired keratosis follicularis: Secondary | ICD-10-CM | POA: Diagnosis not present

## 2022-08-14 DIAGNOSIS — M79675 Pain in left toe(s): Secondary | ICD-10-CM | POA: Diagnosis not present

## 2022-08-14 DIAGNOSIS — M79671 Pain in right foot: Secondary | ICD-10-CM | POA: Diagnosis not present

## 2022-08-14 DIAGNOSIS — E1140 Type 2 diabetes mellitus with diabetic neuropathy, unspecified: Secondary | ICD-10-CM | POA: Diagnosis not present

## 2022-08-14 DIAGNOSIS — M79674 Pain in right toe(s): Secondary | ICD-10-CM | POA: Diagnosis not present

## 2022-08-26 DIAGNOSIS — L84 Corns and callosities: Secondary | ICD-10-CM | POA: Diagnosis not present

## 2022-08-26 DIAGNOSIS — E1140 Type 2 diabetes mellitus with diabetic neuropathy, unspecified: Secondary | ICD-10-CM | POA: Diagnosis not present

## 2022-09-02 DIAGNOSIS — E1122 Type 2 diabetes mellitus with diabetic chronic kidney disease: Secondary | ICD-10-CM | POA: Diagnosis not present

## 2022-09-09 DIAGNOSIS — E1165 Type 2 diabetes mellitus with hyperglycemia: Secondary | ICD-10-CM | POA: Diagnosis not present

## 2022-09-10 DIAGNOSIS — E1165 Type 2 diabetes mellitus with hyperglycemia: Secondary | ICD-10-CM | POA: Diagnosis not present

## 2022-09-13 DIAGNOSIS — E1122 Type 2 diabetes mellitus with diabetic chronic kidney disease: Secondary | ICD-10-CM | POA: Diagnosis not present

## 2022-09-13 DIAGNOSIS — N1832 Chronic kidney disease, stage 3b: Secondary | ICD-10-CM | POA: Diagnosis not present

## 2022-09-24 DIAGNOSIS — K5904 Chronic idiopathic constipation: Secondary | ICD-10-CM | POA: Diagnosis not present

## 2022-09-24 DIAGNOSIS — N200 Calculus of kidney: Secondary | ICD-10-CM | POA: Diagnosis not present

## 2022-09-24 DIAGNOSIS — N1832 Chronic kidney disease, stage 3b: Secondary | ICD-10-CM | POA: Diagnosis not present

## 2022-09-24 DIAGNOSIS — N35013 Post-traumatic anterior urethral stricture: Secondary | ICD-10-CM | POA: Diagnosis not present

## 2022-09-24 DIAGNOSIS — N281 Cyst of kidney, acquired: Secondary | ICD-10-CM | POA: Diagnosis not present

## 2022-09-27 DIAGNOSIS — E119 Type 2 diabetes mellitus without complications: Secondary | ICD-10-CM | POA: Diagnosis not present

## 2022-09-30 DIAGNOSIS — J019 Acute sinusitis, unspecified: Secondary | ICD-10-CM | POA: Diagnosis not present

## 2022-09-30 DIAGNOSIS — R059 Cough, unspecified: Secondary | ICD-10-CM | POA: Diagnosis not present

## 2022-09-30 DIAGNOSIS — R062 Wheezing: Secondary | ICD-10-CM | POA: Diagnosis not present

## 2022-10-10 DIAGNOSIS — E1165 Type 2 diabetes mellitus with hyperglycemia: Secondary | ICD-10-CM | POA: Diagnosis not present

## 2022-10-11 DIAGNOSIS — J069 Acute upper respiratory infection, unspecified: Secondary | ICD-10-CM | POA: Diagnosis not present

## 2022-10-11 DIAGNOSIS — E119 Type 2 diabetes mellitus without complications: Secondary | ICD-10-CM | POA: Diagnosis not present

## 2022-10-18 DIAGNOSIS — E1165 Type 2 diabetes mellitus with hyperglycemia: Secondary | ICD-10-CM | POA: Diagnosis not present

## 2022-10-23 DIAGNOSIS — M79675 Pain in left toe(s): Secondary | ICD-10-CM | POA: Diagnosis not present

## 2022-10-23 DIAGNOSIS — M79672 Pain in left foot: Secondary | ICD-10-CM | POA: Diagnosis not present

## 2022-10-23 DIAGNOSIS — M79674 Pain in right toe(s): Secondary | ICD-10-CM | POA: Diagnosis not present

## 2022-10-23 DIAGNOSIS — L600 Ingrowing nail: Secondary | ICD-10-CM | POA: Diagnosis not present

## 2022-10-23 DIAGNOSIS — M79671 Pain in right foot: Secondary | ICD-10-CM | POA: Diagnosis not present

## 2022-10-23 DIAGNOSIS — L110 Acquired keratosis follicularis: Secondary | ICD-10-CM | POA: Diagnosis not present

## 2022-10-23 DIAGNOSIS — E1140 Type 2 diabetes mellitus with diabetic neuropathy, unspecified: Secondary | ICD-10-CM | POA: Diagnosis not present

## 2022-10-25 DIAGNOSIS — Z7984 Long term (current) use of oral hypoglycemic drugs: Secondary | ICD-10-CM | POA: Diagnosis not present

## 2022-10-25 DIAGNOSIS — N1832 Chronic kidney disease, stage 3b: Secondary | ICD-10-CM | POA: Diagnosis not present

## 2022-10-25 DIAGNOSIS — E1122 Type 2 diabetes mellitus with diabetic chronic kidney disease: Secondary | ICD-10-CM | POA: Diagnosis not present

## 2022-10-26 ENCOUNTER — Emergency Department (HOSPITAL_COMMUNITY): Payer: Medicare Other

## 2022-10-26 ENCOUNTER — Other Ambulatory Visit: Payer: Self-pay

## 2022-10-26 ENCOUNTER — Inpatient Hospital Stay (HOSPITAL_COMMUNITY)
Admission: EM | Admit: 2022-10-26 | Discharge: 2022-10-29 | DRG: 204 | Disposition: A | Discharge status: Home / Self Care | Payer: Medicare Other | Attending: Family Medicine | Admitting: Family Medicine

## 2022-10-26 ENCOUNTER — Encounter (HOSPITAL_COMMUNITY): Payer: Self-pay

## 2022-10-26 DIAGNOSIS — Z79899 Other long term (current) drug therapy: Secondary | ICD-10-CM

## 2022-10-26 DIAGNOSIS — K5730 Diverticulosis of large intestine without perforation or abscess without bleeding: Secondary | ICD-10-CM | POA: Diagnosis not present

## 2022-10-26 DIAGNOSIS — Z800 Family history of malignant neoplasm of digestive organs: Secondary | ICD-10-CM

## 2022-10-26 DIAGNOSIS — Z794 Long term (current) use of insulin: Secondary | ICD-10-CM | POA: Diagnosis not present

## 2022-10-26 DIAGNOSIS — C61 Malignant neoplasm of prostate: Secondary | ICD-10-CM | POA: Diagnosis not present

## 2022-10-26 DIAGNOSIS — T39015A Adverse effect of aspirin, initial encounter: Secondary | ICD-10-CM | POA: Diagnosis present

## 2022-10-26 DIAGNOSIS — Z9221 Personal history of antineoplastic chemotherapy: Secondary | ICD-10-CM

## 2022-10-26 DIAGNOSIS — E1142 Type 2 diabetes mellitus with diabetic polyneuropathy: Secondary | ICD-10-CM | POA: Diagnosis not present

## 2022-10-26 DIAGNOSIS — Z7951 Long term (current) use of inhaled steroids: Secondary | ICD-10-CM

## 2022-10-26 DIAGNOSIS — D62 Acute posthemorrhagic anemia: Secondary | ICD-10-CM | POA: Diagnosis not present

## 2022-10-26 DIAGNOSIS — E669 Obesity, unspecified: Secondary | ICD-10-CM | POA: Diagnosis present

## 2022-10-26 DIAGNOSIS — R590 Localized enlarged lymph nodes: Secondary | ICD-10-CM | POA: Diagnosis not present

## 2022-10-26 DIAGNOSIS — M5412 Radiculopathy, cervical region: Secondary | ICD-10-CM | POA: Diagnosis present

## 2022-10-26 DIAGNOSIS — G4733 Obstructive sleep apnea (adult) (pediatric): Secondary | ICD-10-CM | POA: Diagnosis not present

## 2022-10-26 DIAGNOSIS — I2510 Atherosclerotic heart disease of native coronary artery without angina pectoris: Secondary | ICD-10-CM | POA: Diagnosis present

## 2022-10-26 DIAGNOSIS — Z7985 Long-term (current) use of injectable non-insulin antidiabetic drugs: Secondary | ICD-10-CM

## 2022-10-26 DIAGNOSIS — Z6830 Body mass index (BMI) 30.0-30.9, adult: Secondary | ICD-10-CM

## 2022-10-26 DIAGNOSIS — Z7984 Long term (current) use of oral hypoglycemic drugs: Secondary | ICD-10-CM

## 2022-10-26 DIAGNOSIS — I129 Hypertensive chronic kidney disease with stage 1 through stage 4 chronic kidney disease, or unspecified chronic kidney disease: Secondary | ICD-10-CM | POA: Diagnosis not present

## 2022-10-26 DIAGNOSIS — Z801 Family history of malignant neoplasm of trachea, bronchus and lung: Secondary | ICD-10-CM

## 2022-10-26 DIAGNOSIS — Z9079 Acquired absence of other genital organ(s): Secondary | ICD-10-CM

## 2022-10-26 DIAGNOSIS — R0602 Shortness of breath: Secondary | ICD-10-CM | POA: Diagnosis present

## 2022-10-26 DIAGNOSIS — Z23 Encounter for immunization: Secondary | ICD-10-CM

## 2022-10-26 DIAGNOSIS — K219 Gastro-esophageal reflux disease without esophagitis: Secondary | ICD-10-CM | POA: Diagnosis present

## 2022-10-26 DIAGNOSIS — Z91040 Latex allergy status: Secondary | ICD-10-CM

## 2022-10-26 DIAGNOSIS — I700 Atherosclerosis of aorta: Secondary | ICD-10-CM | POA: Diagnosis not present

## 2022-10-26 DIAGNOSIS — E1151 Type 2 diabetes mellitus with diabetic peripheral angiopathy without gangrene: Secondary | ICD-10-CM | POA: Diagnosis present

## 2022-10-26 DIAGNOSIS — N1832 Chronic kidney disease, stage 3b: Secondary | ICD-10-CM | POA: Diagnosis not present

## 2022-10-26 DIAGNOSIS — Z888 Allergy status to other drugs, medicaments and biological substances status: Secondary | ICD-10-CM

## 2022-10-26 DIAGNOSIS — Z882 Allergy status to sulfonamides status: Secondary | ICD-10-CM

## 2022-10-26 DIAGNOSIS — I6782 Cerebral ischemia: Secondary | ICD-10-CM | POA: Diagnosis not present

## 2022-10-26 DIAGNOSIS — G629 Polyneuropathy, unspecified: Secondary | ICD-10-CM

## 2022-10-26 DIAGNOSIS — Z91041 Radiographic dye allergy status: Secondary | ICD-10-CM

## 2022-10-26 DIAGNOSIS — R059 Cough, unspecified: Secondary | ICD-10-CM | POA: Diagnosis not present

## 2022-10-26 DIAGNOSIS — K51911 Ulcerative colitis, unspecified with rectal bleeding: Secondary | ICD-10-CM | POA: Diagnosis present

## 2022-10-26 DIAGNOSIS — E871 Hypo-osmolality and hyponatremia: Secondary | ICD-10-CM | POA: Diagnosis present

## 2022-10-26 DIAGNOSIS — N289 Disorder of kidney and ureter, unspecified: Principal | ICD-10-CM

## 2022-10-26 DIAGNOSIS — Z885 Allergy status to narcotic agent status: Secondary | ICD-10-CM

## 2022-10-26 DIAGNOSIS — K8020 Calculus of gallbladder without cholecystitis without obstruction: Secondary | ICD-10-CM | POA: Diagnosis not present

## 2022-10-26 DIAGNOSIS — Z8546 Personal history of malignant neoplasm of prostate: Secondary | ICD-10-CM

## 2022-10-26 DIAGNOSIS — E876 Hypokalemia: Secondary | ICD-10-CM | POA: Diagnosis present

## 2022-10-26 DIAGNOSIS — C3431 Malignant neoplasm of lower lobe, right bronchus or lung: Secondary | ICD-10-CM | POA: Diagnosis not present

## 2022-10-26 DIAGNOSIS — E1165 Type 2 diabetes mellitus with hyperglycemia: Secondary | ICD-10-CM

## 2022-10-26 DIAGNOSIS — C8313 Mantle cell lymphoma, intra-abdominal lymph nodes: Secondary | ICD-10-CM | POA: Diagnosis present

## 2022-10-26 DIAGNOSIS — Z1152 Encounter for screening for COVID-19: Secondary | ICD-10-CM | POA: Diagnosis not present

## 2022-10-26 DIAGNOSIS — R911 Solitary pulmonary nodule: Secondary | ICD-10-CM | POA: Diagnosis not present

## 2022-10-26 DIAGNOSIS — F17210 Nicotine dependence, cigarettes, uncomplicated: Secondary | ICD-10-CM | POA: Diagnosis not present

## 2022-10-26 DIAGNOSIS — R042 Hemoptysis: Principal | ICD-10-CM | POA: Diagnosis present

## 2022-10-26 DIAGNOSIS — E1122 Type 2 diabetes mellitus with diabetic chronic kidney disease: Secondary | ICD-10-CM | POA: Diagnosis present

## 2022-10-26 DIAGNOSIS — R5381 Other malaise: Secondary | ICD-10-CM | POA: Diagnosis present

## 2022-10-26 DIAGNOSIS — I10 Essential (primary) hypertension: Secondary | ICD-10-CM | POA: Diagnosis present

## 2022-10-26 DIAGNOSIS — R918 Other nonspecific abnormal finding of lung field: Secondary | ICD-10-CM | POA: Diagnosis not present

## 2022-10-26 DIAGNOSIS — Z833 Family history of diabetes mellitus: Secondary | ICD-10-CM

## 2022-10-26 DIAGNOSIS — Z85038 Personal history of other malignant neoplasm of large intestine: Secondary | ICD-10-CM | POA: Diagnosis not present

## 2022-10-26 DIAGNOSIS — E782 Mixed hyperlipidemia: Secondary | ICD-10-CM | POA: Diagnosis present

## 2022-10-26 DIAGNOSIS — E66811 Obesity, class 1: Secondary | ICD-10-CM | POA: Diagnosis present

## 2022-10-26 DIAGNOSIS — Z91030 Bee allergy status: Secondary | ICD-10-CM

## 2022-10-26 DIAGNOSIS — Z803 Family history of malignant neoplasm of breast: Secondary | ICD-10-CM

## 2022-10-26 DIAGNOSIS — Z7982 Long term (current) use of aspirin: Secondary | ICD-10-CM

## 2022-10-26 DIAGNOSIS — C8319 Mantle cell lymphoma, extranodal and solid organ sites: Secondary | ICD-10-CM | POA: Diagnosis not present

## 2022-10-26 LAB — CBC WITH DIFFERENTIAL/PLATELET
Abs Immature Granulocytes: 0.03 10*3/uL (ref 0.00–0.07)
Basophils Absolute: 0.1 10*3/uL (ref 0.0–0.1)
Basophils Relative: 1 %
Eosinophils Absolute: 0.1 10*3/uL (ref 0.0–0.5)
Eosinophils Relative: 1 %
HCT: 32.6 % — ABNORMAL LOW (ref 39.0–52.0)
Hemoglobin: 10.6 g/dL — ABNORMAL LOW (ref 13.0–17.0)
Immature Granulocytes: 0 %
Lymphocytes Relative: 16 %
Lymphs Abs: 1.1 10*3/uL (ref 0.7–4.0)
MCH: 29.6 pg (ref 26.0–34.0)
MCHC: 32.5 g/dL (ref 30.0–36.0)
MCV: 91.1 fL (ref 80.0–100.0)
Monocytes Absolute: 0.4 10*3/uL (ref 0.1–1.0)
Monocytes Relative: 6 %
Neutro Abs: 5.1 10*3/uL (ref 1.7–7.7)
Neutrophils Relative %: 76 %
Platelets: 143 10*3/uL — ABNORMAL LOW (ref 150–400)
RBC: 3.58 MIL/uL — ABNORMAL LOW (ref 4.22–5.81)
RDW: 15.8 % — ABNORMAL HIGH (ref 11.5–15.5)
WBC: 6.8 10*3/uL (ref 4.0–10.5)
nRBC: 0 % (ref 0.0–0.2)

## 2022-10-26 LAB — TYPE AND SCREEN
ABO/RH(D): A POS
Antibody Screen: NEGATIVE

## 2022-10-26 LAB — COMPREHENSIVE METABOLIC PANEL
ALT: 13 U/L (ref 0–44)
AST: 15 U/L (ref 15–41)
Albumin: 3.5 g/dL (ref 3.5–5.0)
Alkaline Phosphatase: 102 U/L (ref 38–126)
Anion gap: 13 (ref 5–15)
BUN: 29 mg/dL — ABNORMAL HIGH (ref 8–23)
CO2: 22 mmol/L (ref 22–32)
Calcium: 8.6 mg/dL — ABNORMAL LOW (ref 8.9–10.3)
Chloride: 103 mmol/L (ref 98–111)
Creatinine, Ser: 1.59 mg/dL — ABNORMAL HIGH (ref 0.61–1.24)
GFR, Estimated: 42 mL/min — ABNORMAL LOW (ref >60)
Glucose, Bld: 149 mg/dL — ABNORMAL HIGH (ref 70–99)
Potassium: 3.2 mmol/L — ABNORMAL LOW (ref 3.5–5.1)
Sodium: 138 mmol/L (ref 135–145)
Total Bilirubin: 1.2 mg/dL (ref 0.3–1.2)
Total Protein: 6.8 g/dL (ref 6.5–8.1)

## 2022-10-26 LAB — URINALYSIS, W/ REFLEX TO CULTURE (INFECTION SUSPECTED)
Bacteria, UA: NONE SEEN
Bilirubin Urine: NEGATIVE
Glucose, UA: NEGATIVE mg/dL
Ketones, ur: NEGATIVE mg/dL
Leukocytes,Ua: NEGATIVE
Nitrite: NEGATIVE
Protein, ur: NEGATIVE mg/dL
Specific Gravity, Urine: 1.028 (ref 1.005–1.030)
pH: 5 (ref 5.0–8.0)

## 2022-10-26 LAB — POC OCCULT BLOOD, ED: Fecal Occult Blood: NEGATIVE

## 2022-10-26 LAB — RESP PANEL BY RT-PCR (RSV, FLU A&B, COVID)  RVPGX2
Influenza A by PCR: NEGATIVE
Influenza B by PCR: NEGATIVE
Resp Syncytial Virus by PCR: NEGATIVE
SARS Coronavirus 2 by RT PCR: NEGATIVE

## 2022-10-26 LAB — GLUCOSE, CAPILLARY
Glucose-Capillary: 150 mg/dL — ABNORMAL HIGH (ref 70–99)
Glucose-Capillary: 212 mg/dL — ABNORMAL HIGH (ref 70–99)

## 2022-10-26 LAB — APTT: aPTT: 34 seconds (ref 24–36)

## 2022-10-26 LAB — TROPONIN I (HIGH SENSITIVITY)
Troponin I (High Sensitivity): 10 ng/L (ref <18)
Troponin I (High Sensitivity): 10 ng/L (ref <18)

## 2022-10-26 LAB — LACTIC ACID, PLASMA: Lactic Acid, Venous: 1.2 mmol/L (ref 0.5–1.9)

## 2022-10-26 LAB — PROTIME-INR
INR: 1.1 (ref 0.8–1.2)
Prothrombin Time: 13.9 seconds (ref 11.4–15.2)

## 2022-10-26 MED ORDER — IPRATROPIUM-ALBUTEROL 0.5-2.5 (3) MG/3ML IN SOLN
3.0000 mL | Freq: Three times a day (TID) | RESPIRATORY_TRACT | Status: DC
  Administered 2022-10-26 – 2022-10-29 (×7): 3 mL via RESPIRATORY_TRACT
  Filled 2022-10-26 (×8): qty 3

## 2022-10-26 MED ORDER — HYDROCOD POLI-CHLORPHE POLI ER 10-8 MG/5ML PO SUER
5.0000 mL | Freq: Two times a day (BID) | ORAL | Status: DC | PRN
  Administered 2022-10-26: 5 mL via ORAL
  Filled 2022-10-26: qty 5

## 2022-10-26 MED ORDER — ACETAMINOPHEN 325 MG PO TABS: 650.0000 mg | ORAL_TABLET | Freq: Four times a day (QID) | ORAL | Status: DC | PRN

## 2022-10-26 MED ORDER — IPRATROPIUM-ALBUTEROL 0.5-2.5 (3) MG/3ML IN SOLN: 3.0000 mL | Freq: Three times a day (TID) | RESPIRATORY_TRACT | Status: DC

## 2022-10-26 MED ORDER — FLUTICASONE FUROATE-VILANTEROL 200-25 MCG/ACT IN AEPB
1.0000 | INHALATION_SPRAY | Freq: Every day | RESPIRATORY_TRACT | Status: DC
  Administered 2022-10-27 – 2022-10-29 (×3): 1 via RESPIRATORY_TRACT
  Filled 2022-10-26: qty 28

## 2022-10-26 MED ORDER — BISACODYL 5 MG PO TBEC
5.0000 mg | DELAYED_RELEASE_TABLET | Freq: Every day | ORAL | Status: DC | PRN
  Administered 2022-10-27: 5 mg via ORAL
  Filled 2022-10-26: qty 1

## 2022-10-26 MED ORDER — AMLODIPINE BESYLATE 5 MG PO TABS
5.0000 mg | ORAL_TABLET | Freq: Every day | ORAL | Status: DC
  Administered 2022-10-27 – 2022-10-29 (×3): 5 mg via ORAL
  Filled 2022-10-26 (×3): qty 1

## 2022-10-26 MED ORDER — ATORVASTATIN CALCIUM 40 MG PO TABS
40.0000 mg | ORAL_TABLET | Freq: Every evening | ORAL | Status: DC
  Administered 2022-10-26 – 2022-10-28 (×3): 40 mg via ORAL
  Filled 2022-10-26 (×3): qty 1

## 2022-10-26 MED ORDER — ACETAMINOPHEN 650 MG RE SUPP: 650.0000 mg | Freq: Four times a day (QID) | RECTAL | Status: DC | PRN

## 2022-10-26 MED ORDER — LACTATED RINGERS IV SOLN: INTRAVENOUS | Status: DC

## 2022-10-26 MED ORDER — OXYCODONE HCL 5 MG PO TABS: 5.0000 mg | ORAL_TABLET | Freq: Four times a day (QID) | ORAL | Status: DC | PRN

## 2022-10-26 MED ORDER — INSULIN GLARGINE-YFGN 100 UNIT/ML ~~LOC~~ SOLN
40.0000 [IU] | Freq: Every day | SUBCUTANEOUS | Status: DC
  Administered 2022-10-27: 40 [IU] via SUBCUTANEOUS
  Filled 2022-10-26 (×2): qty 0.4

## 2022-10-26 MED ORDER — ONDANSETRON HCL 4 MG PO TABS: 4.0000 mg | ORAL_TABLET | Freq: Four times a day (QID) | ORAL | Status: DC | PRN

## 2022-10-26 MED ORDER — TRAZODONE HCL 50 MG PO TABS: 25.0000 mg | ORAL_TABLET | Freq: Every evening | ORAL | Status: DC | PRN

## 2022-10-26 MED ORDER — IPRATROPIUM-ALBUTEROL 0.5-2.5 (3) MG/3ML IN SOLN
3.0000 mL | RESPIRATORY_TRACT | Status: DC
  Filled 2022-10-26: qty 3

## 2022-10-26 MED ORDER — PNEUMOCOCCAL 20-VAL CONJ VACC 0.5 ML IM SUSY
0.5000 mL | PREFILLED_SYRINGE | INTRAMUSCULAR | Status: AC
  Administered 2022-10-27: 0.5 mL via INTRAMUSCULAR
  Filled 2022-10-26: qty 0.5

## 2022-10-26 MED ORDER — GUAIFENESIN 100 MG/5ML PO LIQD
5.0000 mL | ORAL | Status: DC | PRN
  Administered 2022-10-26: 5 mL via ORAL
  Filled 2022-10-26: qty 5

## 2022-10-26 MED ORDER — GUAIFENESIN ER 600 MG PO TB12
600.0000 mg | ORAL_TABLET | Freq: Two times a day (BID) | ORAL | Status: AC
  Administered 2022-10-26 – 2022-10-29 (×6): 600 mg via ORAL
  Filled 2022-10-26 (×6): qty 1

## 2022-10-26 MED ORDER — POTASSIUM CHLORIDE CRYS ER 20 MEQ PO TBCR
40.0000 meq | EXTENDED_RELEASE_TABLET | Freq: Once | ORAL | Status: AC
  Administered 2022-10-26: 40 meq via ORAL
  Filled 2022-10-26: qty 2

## 2022-10-26 MED ORDER — ONDANSETRON HCL 4 MG/2ML IJ SOLN
4.0000 mg | Freq: Four times a day (QID) | INTRAMUSCULAR | Status: DC | PRN
  Administered 2022-10-27 – 2022-10-28 (×3): 4 mg via INTRAVENOUS
  Filled 2022-10-26 (×3): qty 2

## 2022-10-26 MED ORDER — INSULIN ASPART 100 UNIT/ML IJ SOLN
10.0000 [IU] | Freq: Three times a day (TID) | INTRAMUSCULAR | Status: DC
  Administered 2022-10-27 (×2): 10 [IU] via SUBCUTANEOUS

## 2022-10-26 MED ORDER — PROPRANOLOL HCL ER 80 MG PO CP24
80.0000 mg | ORAL_CAPSULE | Freq: Every day | ORAL | Status: DC
  Administered 2022-10-26 – 2022-10-28 (×3): 80 mg via ORAL
  Filled 2022-10-26 (×4): qty 1

## 2022-10-26 MED ORDER — INSULIN ASPART 100 UNIT/ML IJ SOLN
0.0000 [IU] | Freq: Three times a day (TID) | INTRAMUSCULAR | Status: DC
  Administered 2022-10-27: 3 [IU] via SUBCUTANEOUS
  Administered 2022-10-27: 1 [IU] via SUBCUTANEOUS
  Administered 2022-10-28 (×2): 2 [IU] via SUBCUTANEOUS

## 2022-10-26 MED ORDER — PANTOPRAZOLE SODIUM 40 MG IV SOLR
40.0000 mg | Freq: Once | INTRAVENOUS | Status: AC
  Administered 2022-10-26: 40 mg via INTRAVENOUS
  Filled 2022-10-26: qty 10

## 2022-10-26 MED ORDER — FLUTICASONE PROPIONATE 50 MCG/ACT NA SUSP: 1.0000 | Freq: Every day | NASAL | Status: DC | PRN

## 2022-10-26 MED ORDER — LORAZEPAM 0.5 MG PO TABS: 0.5000 mg | ORAL_TABLET | Freq: Every evening | ORAL | Status: DC | PRN

## 2022-10-26 MED ORDER — IOHEXOL 300 MG/ML  SOLN
80.0000 mL | Freq: Once | INTRAMUSCULAR | Status: AC | PRN
  Administered 2022-10-26: 80 mL via INTRAVENOUS

## 2022-10-26 MED ORDER — FENTANYL CITRATE PF 50 MCG/ML IJ SOSY: 12.5000 ug | PREFILLED_SYRINGE | INTRAMUSCULAR | Status: DC | PRN

## 2022-10-26 NOTE — Discharge Instructions (Signed)
You were seen in the emergency department for blood in your sputum with cough along with some passing blood in your urine and possibly blood in your stool.  You had blood work CAT scan of your chest abdomen and pelvis.  The CAT scan of your chest shows a lung mass and enlarged lymph nodes.  This is possibly cancer and will need further testing.  This may also be a source of your bleeding.  You were mildly anemic but did not need a transfusion at this time.  Please follow-up with your primary care doctor and Dr. Delton Coombes oncology.  Return to the emergency department if any worsening or concerning symptoms  The scheduling department in [place] will be contacting you to schedule your IR appointment within about a week for lymph node biopsy and port placement.  Also please follow-up with Dr. Delton Coombes as scheduled to discuss your treatment options.  Please hold aspirin for now until after biopsy has been completed.     Please follow up with PET scan as ordered and scheduled for you.      IMPORTANT INFORMATION: PAY CLOSE ATTENTION   PHYSICIAN DISCHARGE INSTRUCTIONS  Follow with Primary care provider  Celene Squibb, MD  and other consultants as instructed by your Hospitalist Physician  [facility] IF SYMPTOMS COME BACK, WORSEN OR NEW PROBLEM DEVELOPS   Please note: You were cared for by a hospitalist during your hospital stay. Every effort will be made to forward records to your primary care provider.  You can request that your primary care provider send for your hospital records if they have not received them.  Once you are discharged, your primary care physician will handle any further medical issues. Please note that NO REFILLS for any discharge medications will be authorized once you are discharged, as it is imperative that you return to your primary care physician (or establish a relationship with a primary care physician if you do not have one) for your post  hospital discharge needs so that they can reassess your need for medications and monitor your lab values.  Please get a complete blood count and chemistry panel checked by your Primary MD at your next visit, and again as instructed by your Primary MD.  Get Medicines reviewed and adjusted: Please take all your medications with you for your next visit with your Primary MD  Laboratory/radiological data: Please request your Primary MD to go over all hospital tests and procedure/radiological results at the follow up, please ask your primary care provider to get all Hospital records sent to his/her office.  In some cases, they will be blood work, cultures and biopsy results pending at the time of your discharge. Please request that your primary care provider follow up on these results.  If you are diabetic, please bring your blood sugar readings with you to your follow up appointment with primary care.    Please call and make your follow up appointments as soon as possible.    Also Note the following: If you experience worsening of your admission symptoms, develop shortness of breath, life threatening emergency, suicidal or homicidal thoughts you must seek medical attention immediately by calling 911 or calling your MD immediately  if symptoms less severe.  You must read complete instructions/literature along with all the possible adverse reactions/side effects for all the Medicines you take and that have been prescribed to you. Take any new Medicines after you have completely understood and accpet all the  possible adverse reactions/side effects.   Do not drive when taking Pain medications or sleeping medications (Benzodiazepines)  Do not take more than prescribed Pain, Sleep and Anxiety Medications. It is not advisable to combine anxiety,sleep and pain medications without talking with your primary care practitioner  Special Instructions: If you have smoked or chewed Tobacco  in the last 2 yrs  please stop smoking, stop any regular Alcohol  and or any Recreational drug use.  Wear Seat belts while driving.  Do not drive if taking any narcotic, mind altering or controlled substances or recreational drugs or alcohol.

## 2022-10-26 NOTE — H&P (Signed)
History and Physical  [place] XKG:[id number] DOB: [birth year] DOA: 10/26/2022  PCP: Celene Squibb, MD  Patient coming from: Home  Level of care: Med-Surg  I have personally briefly reviewed patient's old medical records in [place]  Chief Complaint: coughing up blood   HPI: [name] is a 88 year old gentleman with history of mantle cell lymphoma colon cancer December 2014 treated with chemotherapy and has remained in remission, ulcerative colitis in remission off therapy, hypertension, hyperlipidemia, OSA, B12 deficiency, history of prostate cancer, coronary artery disease, GERD, hyperlipidemia, peripheral vascular disease, type 2 diabetes mellitus with chronic kidney disease presents to the emergency department with his wife complaining of cough and chest congestion for the past 2 weeks and now he is coughing up blood that is dark in color.  He reports that he coughed up blood most of the last 24 hours and increasing amounts of blood reported.  He also reports that he has noticed some rectal bleeding.  He reports that his cough seems to be worse every time he eats and he has not been able to eat in the last 24 hours.  He reports that every time he tries to eat something he starts coughing.  He also reports malaise and fatigue.  He reports having 2 black bowel movements yesterday.  He also reports that he saw blood in his urine.  He is not on any blood thinner medications.  His respiratory panel was negative for RSV influenza and coronavirus.  The patient sent for CT scan of the chest abdomen and pelvis with findings of a spiculated nodule in the superior segment of the right lower lobe concerning for new bronchogenic carcinoma.  His Hg is 10.6.  After discussing with oncologist patient was given option of outpatient follow up.  However patient is feeling too weak to go home and afraid of worsening hemoptysis over next 24 hours.  He will be admitted for  observation and serial Hg testing and symptom management of cough and evaluate for rectal bleeding.       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, anomalous circumflex - no PCIs   Essential hypertension, benign    GERD (gastroesophageal reflux disease)    Mixed hyperlipidemia    Obstructive sleep apnea    does not use   Prostate cancer ([place])    PVD (peripheral vascular disease) ([place])    Type 2 diabetes mellitus ([place])     Past Surgical History:  Procedure Laterality Date   BACK SURGERY     BALLOON DILATION N/A 08/27/2013   Procedure: BALLOON DILATION;  Surgeon: Rogene Houston, MD;  [facility];  Service: Endoscopy;  Laterality: N/A;   COLON SURGERY     COLONOSCOPY N/A 12/17/2013   Procedure: COLONOSCOPY;  Surgeon: Rogene Houston, MD;  [facility];  Service: Endoscopy;  Laterality: N/A;  940   COLONOSCOPY N/A 02/28/2014   Procedure: COLONOSCOPY;  Surgeon: Rogene Houston, MD;  [facility];  Service: Endoscopy;  Laterality: N/A;  730   COLONOSCOPY N/A 08/04/2015   Procedure: COLONOSCOPY;  Surgeon: Rogene Houston, MD;  [facility];  Service: Endoscopy;  Laterality: N/A;  200 - moved to 11/11 @ 2:10 - Ann to notify pt   COLONOSCOPY WITH ESOPHAGOGASTRODUODENOSCOPY (EGD) N/A 08/27/2013   Procedure: COLONOSCOPY WITH ESOPHAGOGASTRODUODENOSCOPY (EGD);  Surgeon: Rogene Houston, MD;  [facility];  Service: Endoscopy;  Laterality: N/A;  730   COLONOSCOPY WITH PROPOFOL N/A 01/02/2022   Procedure: COLONOSCOPY WITH PROPOFOL;  Surgeon: Rogene Houston, MD;  [facility];  Service: Endoscopy;  Laterality: N/A;  1205   CYSTOSCOPY N/A 06/09/2021   Procedure: CYSTOSCOPY;   Surgeon: Lucas Mallow, MD;  Location: [facility];  Service: Urology;  Laterality: N/A;   IR REMOVAL TUN ACCESS W/ PORT W/O FL MOD SED  07/17/2018   KNEE ARTHROSCOPY Left    KNEE SURGERY Right    total knee   MALONEY DILATION N/A 08/27/2013   Procedure: MALONEY DILATION;  Surgeon: Rogene Houston, MD;  [facility];  Service: Endoscopy;  Laterality: N/A;   POLYPECTOMY  01/02/2022   Procedure: POLYPECTOMY;  Surgeon: Rogene Houston, MD;  [facility];  Service: Endoscopy;;  cecal;ascending;proximal, sigmoid;rectal;   PORTACATH PLACEMENT Right 09/23/2012   PROSTATECTOMY     REMOVAL OF PENILE PROSTHESIS N/A 06/09/2021   Procedure: REMOVAL OF ARTIFICIAL URINARY SPHINCER; REMOVAL OF INFLATABLE PENILE PROSTHESIS;  Surgeon: Lucas Mallow, MD;  [facility];  Service: Urology;  Laterality: N/A;   removal of port Right    SAVORY DILATION N/A 08/27/2013   Procedure: SAVORY DILATION;  Surgeon: Rogene Houston, MD;  [facility];  Service: Endoscopy;  Laterality: N/A;   SHOULDER SURGERY     TONSILLECTOMY     URINARY SPHINCTER IMPLANT N/A 01/23/2017   artificial urinary sphincter implant by Dr. Odis Luster   VASECTOMY       reports that he has never smoked. He has never used smokeless tobacco. He reports that he does not drink alcohol and does not use drugs.  Allergies  Allergen Reactions   Bee Venom Anaphylaxis   Adhesive [Tape] Hives   Ciprofloxacin     Pt does not remember reaction    Codeine     Pt does not remember reaction    Iodine     Pt does not remember reaction     Povidone-Iodine     Pt does not remember reaction    Simvastatin     Pt does not remember reaction    Latex Rash    Family History  Problem Relation Age of Onset   Colon cancer Brother    Cancer Brother    Diabetes Father    Cancer Brother     Prior to Admission medications   Medication Sig Start Date End Date Taking? Authorizing Provider  acetaminophen (TYLENOL)  325 MG tablet Take 650 mg by mouth every 6 (six) hours as needed for mild pain or moderate pain.   Yes [provider]  albuterol (PROAIR HFA) 108 (90 Base) MCG/ACT inhaler Inhale 2 puffs into the lungs at bedtime as needed for wheezing or shortness of breath. 05/12/20  Yes Tanda Rockers, MD  amLODipine (NORVASC) 10 MG tablet Take 1 tablet (10 mg total) by mouth daily. 01/11/22  Yes Richardson Dopp T, PA-C  aspirin EC 81 MG tablet Take 1 tablet (81 mg total) by mouth daily. Swallow whole. 01/05/22  Yes Rehman, Mechele Dawley, MD  atorvastatin (LIPITOR) 40 MG tablet Take 40 mg by mouth daily. 10/05/21  Yes [provider]  budesonide-formoterol (SYMBICORT) 160-4.5 MCG/ACT  inhaler Inhale 2 puffs into the lungs 2 (two) times daily. 05/12/20  Yes Tanda Rockers, MD  fluticasone (FLONASE) 50 MCG/ACT nasal spray Place 1 spray into both nostrils as needed for allergies or rhinitis.   Yes [provider]  insulin aspart (NOVOLOG) 100 UNIT/ML injection Inject 8-10 Units into the skin 3 (three) times daily before meals. Per sliding scale   Yes [provider]  LORazepam (ATIVAN) 0.5 MG tablet Take 0.5 mg by mouth at bedtime as needed for sleep.   Yes [provider]  meclizine (ANTIVERT) 25 MG tablet Take 1 tablet (25 mg total) by mouth 3 (three) times daily as needed for dizziness. 10/15/21  Yes Kathie Dike, MD  metFORMIN (GLUCOPHAGE-XR) 750 MG 24 hr tablet Take 750 mg by mouth daily. 11/06/21  Yes [provider]  propranolol ER (INDERAL LA) 80 MG 24 hr capsule Take 1 capsule (80 mg total) by mouth at bedtime. 10/15/21  Yes Kathie Dike, MD  testosterone cypionate (DEPOTESTOSTERONE CYPIONATE) 200 MG/ML injection Inject 200 mg into the muscle every 14 (fourteen) days.  04/29/16  Yes [provider]  TOUJEO SOLOSTAR 300 UNIT/ML Solostar Pen Inject 30 Units into the skin 2 (two) times daily. Patient taking differently: Inject 70 Units into the skin daily.  10/15/21  Yes Kathie Dike, MD  blood glucose meter kit and supplies KIT Dispense based on patient and insurance preference. Use at least three times a day to check BS levels and fluctuation. 09/04/21   Barton Dubois, MD   Physical Exam: Vitals:   10/26/22 1200 10/26/22 1230 10/26/22 1347 10/26/22 1604  BP: 122/83 126/86  138/83  Pulse:  94  89  Resp: (!) 26 20  (!) 23  Temp:   98.8 F (37.1 C) 98.5 F (36.9 C)  TempSrc:   Oral Oral  SpO2:  90%  (!) 89%  Weight:    98.8 kg  Height:    5\' 11"  (1.803 m)   Constitutional: NAD, calm, comfortable, frequent coughing.  Eyes: PERRL, lids and conjunctivae normal ENMT: Mucous membranes are dry.  Posterior pharynx clear of any exudate or lesions.Normal dentition.  Neck: normal, supple, no masses, no thyromegaly Respiratory: clear to auscultation bilaterally, no wheezing, no crackles. Normal respiratory effort. No accessory muscle use.  Cardiovascular: normal s1, s2 sounds, no murmurs / rubs / gallops. No extremity edema. 2+ pedal pulses. No carotid bruits.  Abdomen: no tenderness, no masses palpated. No hepatosplenomegaly. Bowel sounds positive.  Musculoskeletal: no clubbing / cyanosis. No joint deformity upper and lower extremities. Good ROM, no contractures. Normal muscle tone.  Skin: no rashes, lesions, ulcers. No induration Neurologic: CN 2-12 grossly intact. Sensation intact, DTR normal. Strength 5/5 in all 4.  Psychiatric: Normal judgment and insight. Alert and oriented x 3. Normal mood.   Labs on Admission: I have personally reviewed following labs and imaging studies  CBC: Recent Labs  Lab 10/26/22 0902  WBC 6.8  NEUTROABS 5.1  HGB 10.6*  HCT 32.6*  MCV 91.1  PLT 244*   Basic Metabolic Panel: Recent Labs  Lab 10/26/22 0902  NA 138  K 3.2*  CL 103  CO2 22  GLUCOSE 149*  BUN 29*  CREATININE 1.59*  CALCIUM 8.6*   GFR: Estimated Creatinine Clearance: 40.7 mL/min (A) (by C-G formula based on SCr of 1.59 mg/dL  (H)). Liver Function Tests: Recent Labs  Lab 10/26/22 0902  AST 15  ALT 13  ALKPHOS 102  BILITOT 1.2  PROT 6.8  ALBUMIN  3.5   No results for input(s): "LIPASE", "AMYLASE" in the last 168 hours. No results for input(s): "AMMONIA" in the last 168 hours. Coagulation Profile: Recent Labs  Lab 10/26/22 0902  INR 1.1   Cardiac Enzymes: No results for input(s): "CKTOTAL", "CKMB", "CKMBINDEX", "TROPONINI" in the last 168 hours. BNP (last 3 results) No results for input(s): "PROBNP" in the last 8760 hours. HbA1C: No results for input(s): "HGBA1C" in the last 72 hours. CBG: No results for input(s): "GLUCAP" in the last 168 hours. Lipid Profile: No results for input(s): "CHOL", "HDL", "LDLCALC", "TRIG", "CHOLHDL", "LDLDIRECT" in the last 72 hours. Thyroid Function Tests: No results for input(s): "TSH", "T4TOTAL", "FREET4", "T3FREE", "THYROIDAB" in the last 72 hours. Anemia Panel: No results for input(s): "VITAMINB12", "FOLATE", "FERRITIN", "TIBC", "IRON", "RETICCTPCT" in the last 72 hours. Urine analysis:    Component Value Date/Time   COLORURINE YELLOW 10/26/2022 1324   APPEARANCEUR CLEAR 10/26/2022 1324   LABSPEC 1.028 10/26/2022 1324   PHURINE 5.0 10/26/2022 1324   GLUCOSEU NEGATIVE 10/26/2022 1324   HGBUR MODERATE (A) 10/26/2022 1324   BILIRUBINUR NEGATIVE 10/26/2022 1324   BILIRUBINUR negative 05/04/2021 1702   KETONESUR NEGATIVE 10/26/2022 1324   PROTEINUR NEGATIVE 10/26/2022 1324   UROBILINOGEN 1.0 05/04/2021 1702   UROBILINOGEN 0.2 06/30/2015 1552   NITRITE NEGATIVE 10/26/2022 1324   LEUKOCYTESUR NEGATIVE 10/26/2022 1324    Radiological Exams on Admission: CT CHEST ABDOMEN PELVIS W CONTRAST  Result Date: 10/26/2022 CLINICAL DATA:  Cough and congestion for 2 weeks. Now with hemoptysis. Patient also reports rectal bleeding. History of colon and prostate cancer. Status post prostatectomy. * Tracking Code: BO * EXAM: CT CHEST, ABDOMEN, AND PELVIS WITH CONTRAST  TECHNIQUE: Multidetector CT imaging of the chest, abdomen and pelvis was performed following the standard protocol during bolus administration of intravenous contrast. RADIATION DOSE REDUCTION: This exam was performed according to the departmental dose-optimization program which includes automated exposure control, adjustment of the mA and/or kV according to patient size and/or use of iterative reconstruction technique. CONTRAST:  45mL OMNIPAQUE IOHEXOL 300 MG/ML  SOLN COMPARISON:  CT chest, abdomen and pelvis 05/19/2021 FINDINGS: CT CHEST FINDINGS Cardiovascular: Aortic atherosclerosis. Normal heart size. No pericardial effusion. Mediastinum/Nodes: Thyroid gland, trachea, and esophagus are unremarkable. -left supraclavicular lymph node is new measuring 1.6 cm, image 7/2. -Subcarinal lymph node is enlarged measuring 1.9 cm, image 31/2. -new left paratracheal lymph node measures 1.5 cm, image 26/2. -mass versus conglomerate adenopathy in the left hilum measures 2.7 by 5.8 by 5.5 cm, image 31/2 and coronal image 105/3. Lungs/Pleura: -Within the superior segment of the right lower lobe there is a spiculated nodule measuring 1.4 cm, image 64/5. New from previous exam. -Tiny solid nodule within the posterior right apex measures 5 mm, image 47/5. New from the prior exam No pleural fluid or airspace disease. No atelectasis or pneumothorax identified Musculoskeletal: Status post ACDF within lower cervical spine. No acute or suspicious osseous findings. CT ABDOMEN PELVIS FINDINGS Hepatobiliary: No suspicious liver abnormality. Gallstones are noted measuring up to 7 mm. No gallbladder wall inflammation or signs of bile duct dilatation. Pancreas: Unremarkable. No pancreatic ductal dilatation or surrounding inflammatory changes. Spleen: Normal in size without focal abnormality. Adrenals/Urinary Tract: Bilateral Bosniak class 1 cysts are identified. These measure up to 3.6 cm. No follow-up recommended. Slightly exophytic lesion  arising off the posterior right kidney does not meet CT criteria for a benign cyst measuring 9 mm and 50 Hounsfield units, image 76/2. This may represent a small enhancing lesion or  hemorrhagic/proteinaceous cyst. No hydronephrosis or nephrolithiasis. Urinary bladder is unremarkable. Stomach/Bowel: Stomach appears normal. The appendix is visualized and appears within normal limits. No pathologic dilatation of the large or small bowel loops. Distal colonic diverticulosis without signs of acute diverticulitis. No obstructing mass noted. Vascular/Lymphatic: Aortic atherosclerosis. No enlarged abdominal or pelvic lymph nodes. Reproductive: Prostate gland is surgically absent. Penile prosthesis reservoir is noted within the right inguinal region. Other: No free fluid or fluid collections. Musculoskeletal: Stable sclerotic lesion within the right iliac bone which is favored to represent a benign bone island no suspicious bone lesions identified. No acute osseous findings. IMPRESSION: 1. There is a spiculated nodule within the superior segment of the right lower lobe which is new from previous exam and worrisome for primary bronchogenic carcinoma. 2. Enlarged left supraclavicular, left paratracheal subcarinal and left hilar lymph nodes compatible with metastatic adenopathy. 3. No signs of metastatic disease within the abdomen or pelvis. 4. Gallstones. 5. Slightly exophytic lesion arising off the posterior right kidney does not meet CT criteria for a benign cyst measuring 9 mm and 50 Hounsfield units. This may represent a small enhancing lesion or hemorrhagic/proteinaceous cyst. This does not require emergent attention. However, when the patient is clinically stable consider more definitive characterization with dedicated, outpatient renal protocol CT or MRI. Alternatively referral to urologist may be considered further management. 6. Status post prostatectomy. 7.  Aortic Atherosclerosis (ICD10-I70.0). Electronically Signed    By: Kerby Moors M.D.   On: 10/26/2022 11:35   DG Chest Port 1 View  Result Date: 10/26/2022 CLINICAL DATA:  Possible sepsis.  Cough and congestion for 2 weeks EXAM: PORTABLE CHEST 1 VIEW COMPARISON:  10/09/2021 FINDINGS: Numerous leads and wires project over the chest. Lower cervical spine fixation. Moderate right hemidiaphragm elevation. Midline trachea. Normal heart size for level of inspiration. Atherosclerosis in the transverse aorta. Possible tiny left pleural effusion. No pneumothorax. No congestive failure. Subtle obscuration of the left hemidiaphragm. IMPRESSION: Possible tiny left pleural effusion and adjacent left base airspace disease which could represent atelectasis or early infection. If the patient can undergo PA and lateral radiographs, these should be considered. Aortic Atherosclerosis (ICD10-I70.0). Electronically Signed   By: Abigail Miyamoto M.D.   On: 10/26/2022 09:45    EKG: Independently reviewed.   Assessment/Plan Principal Problem:   Hemoptysis Active Problems:   Mixed hyperlipidemia   Hypertension   CAD (coronary artery disease)   GERD (gastroesophageal reflux disease)   SOB (shortness of breath)   OSA (obstructive sleep apnea)   Acute blood loss anemia   Right lower lobe lung mass   Peripheral polyneuropathy   Type 2 diabetes mellitus with chronic kidney disease, with long-term current use of insulin (HCC)   Obesity (BMI 30.0-34.9)   Chronic renal failure, stage 3b (HCC)   Cervical radiculopathy   Aortic atherosclerosis (HCC)   Hypokalemia   Right lower lobe mass with mediastinal lymphadenopathy -Highly suspicious for bronchogenic carcinoma -Pt insists on seeing oncology and pulmonology inpatient vs outpatient follow up -he will likely need a bronchoscopy and pending further pathology findings to determine treatments -pt would like to pursue full treatments as he says he has "beaten cancer before." -will ask oncology and pulmonology to see on 2/5 when  available to consult  Hemoptysis -will give cough suppressants as needed -follow Hg closely  -pulmonary consultation will be requested   Hematuria -check urinalysis  Hypokalemia -oral replacement given -recheck in AM with Mg  Type 2 DM with renal complications - sensitive SSI  and frequent CBG monitoring - carb modified diet  Stage 3b CKD -stable from recent testing -follow BMP -renally dose medications  CAD -no active chest pain -transfuse PRBC for Hg<8    DVT prophylaxis: SCDs  Code Status: Full   Family Communication: wife at bedside   Disposition Plan: TBD   Consults called: oncology/pulmonology   Admission status: OB  Level of care: Med-Surg Irwin Brakeman MD [facility] How to contact the [facility] Attending or Consulting provider [place] or covering provider during after hours [place], for this patient?  Check the care team in [facility] and look for a) attending/consulting [facility] provider listed and b) the [facility] team listed Log into [email or web address] and use [place]'s universal password to access. If you do not have the password, please contact the hospital operator. Locate the [facility] provider you are looking for under [facility] and page to a number that you can be directly reached. If you still have difficulty reaching the provider, please page the [facility] (Director on Call) for the Hospitalists listed on amion for assistance.   If 7PM-7AM, please contact night-coverage [email or web address] Password [facility]  10/26/2022, 4:25 PM

## 2022-10-26 NOTE — Progress Notes (Signed)
Called by nurse to give patient one time neb treatment. Patient had coughing spell earlier. Patient refused stated he was going to sleep !!!! He has been very unpleasant since his first neb treatment.

## 2022-10-26 NOTE — ED Triage Notes (Signed)
Patient reports cough/congestion for the past two weeks. States that he is now coughing up blood, dark in color. Reports rectal bleeding, mild in nature. States that he does not know what color this is? Reports coughing up blood for the past week. Endorses mild, intermittent abdominal pain.

## 2022-10-26 NOTE — Hospital Course (Signed)
88 year old gentleman with history of mantle cell lymphoma colon cancer December 2014 treated with chemotherapy and has remained in remission, ulcerative colitis in remission off therapy, hypertension, hyperlipidemia, OSA, B12 deficiency, history of prostate cancer, coronary artery disease, GERD, hyperlipidemia, peripheral vascular disease, type 2 diabetes mellitus with chronic kidney disease presents to the emergency department with his wife complaining of cough and chest congestion for the past 2 weeks and now he is coughing up blood that is dark in color.  He reports that he coughed up blood most of the last 24 hours and increasing amounts of blood reported.  He also reports that he has noticed some rectal bleeding.  He reports that his cough seems to be worse every time he eats and he has not been able to eat in the last 24 hours.  He reports that every time he tries to eat something he starts coughing.  He also reports malaise and fatigue.  He reports having 2 black bowel movements yesterday.  He also reports that he saw blood in his urine.  He is not on any blood thinner medications.  His respiratory panel was negative for RSV influenza and coronavirus.  The patient sent for CT scan of the chest abdomen and pelvis with findings of a spiculated nodule in the superior segment of the right lower lobe concerning for new bronchogenic carcinoma.  His Hg is 10.6.  After discussing with oncologist patient was given option of outpatient follow up.  However patient is feeling too weak to go home and afraid of worsening hemoptysis over next 24 hours.  He will be admitted for observation and serial Hg testing and symptom management of cough and evaluate for rectal bleeding.

## 2022-10-26 NOTE — ED Provider Notes (Signed)
[facility] Provider Note   CSN: [id number] Arrival date & time: 10/26/22  1601     History  Chief Complaint  Patient presents with   Rectal Bleeding   Hemoptysis    [name] is a 88 y.o. male.[name]  He has a very poor historian.  Complaining of cough for 3 weeks.  Over the last few days cough has had some blood in it.  Feels short of breath at times.  Also has had some left-sided chest pain that radiates into the left side of his abdomen.  He said that yesterday he had 2 black bowel movements.  He also said he has been urinating blood for about a week.  Saw his primary care doctor for that and was put on a medicine (does not know the name or type).  It does not seem to have done anything and still has blood in his urine.  He said all the symptoms are new over the past few weeks.  He denies any fevers or chills.  No vomiting.  He is not on a blood thinner  The history is provided by the patient.  Rectal Bleeding Quality:  Black and tarry Amount:  Moderate Duration:  1 day Timing:  Sporadic Chronicity:  New Context: defecation   Similar prior episodes: no   Relieved by:  None tried Worsened by:  Nothing Ineffective treatments:  None tried Associated symptoms: abdominal pain   Associated symptoms: no epistaxis, no fever, no hematemesis, no loss of consciousness and no vomiting   Risk factors: no anticoagulant use   Cough Cough characteristics:  Productive Sputum characteristics:  Bloody Severity:  Moderate Onset quality:  Gradual Duration:  3 weeks Timing:  Intermittent Progression:  Unchanged Chronicity:  New Smoker: no   Relieved by:  None tried Worsened by:  Nothing Ineffective treatments:  None tried Associated symptoms: chest pain and shortness of breath   Associated symptoms: no fever and no sore throat        Home Medications Prior to Admission medications   Medication Sig Start Date End Date Taking?  Authorizing Provider  acetaminophen (TYLENOL) 325 MG tablet Take 650 mg by mouth every 6 (six) hours as needed for mild pain or moderate pain.    [provider]  albuterol (PROAIR HFA) 108 (90 Base) MCG/ACT inhaler Inhale 2 puffs into the lungs at bedtime as needed for wheezing or shortness of breath. 05/12/20   Tanda Rockers, MD  amLODipine (NORVASC) 10 MG tablet Take 1 tablet (10 mg total) by mouth daily. 01/11/22   Richardson Dopp T, PA-C  aspirin EC 81 MG tablet Take 1 tablet (81 mg total) by mouth daily. Swallow whole. 01/05/22   Rehman, Mechele Dawley, MD  atorvastatin (LIPITOR) 40 MG tablet Take 40 mg by mouth daily. 10/05/21   [provider]  blood glucose meter kit and supplies KIT Dispense based on patient and insurance preference. Use at least three times a day to check BS levels and fluctuation. 09/04/21   Barton Dubois, MD  budesonide-formoterol [facility]) 160-4.5 MCG/ACT inhaler Inhale 2 puffs into the lungs 2 (two) times daily. 05/12/20   Tanda Rockers, MD  cyclobenzaprine (FLEXERIL) 5 MG tablet Take 1 tablet (5 mg total) by mouth 2 (two) times daily as needed for muscle spasms. 10/15/21   Kathie Dike, MD  fluticasone (FLONASE) 50 MCG/ACT nasal spray Place 1 spray into both nostrils as needed for allergies or rhinitis.  [provider]  insulin aspart (NOVOLOG) 100 UNIT/ML injection Inject 20 Units into the skin 3 (three) times daily before meals.    [provider]  LORazepam (ATIVAN) 0.5 MG tablet Take 0.5 mg by mouth at bedtime as needed for sleep.    [provider]  meclizine (ANTIVERT) 25 MG tablet Take 1 tablet (25 mg total) by mouth 3 (three) times daily as needed for dizziness. 10/15/21   Kathie Dike, MD  metFORMIN (GLUCOPHAGE-XR) 750 MG 24 hr tablet Take 750 mg by mouth daily. 11/06/21   [provider]  pantoprazole (PROTONIX) 40 MG tablet Take 1 tablet (40 mg total) by mouth 2 (two) times daily before a meal. 01/02/22    Rehman, Mechele Dawley, MD  propranolol ER (INDERAL LA) 80 MG 24 hr capsule Take 1 capsule (80 mg total) by mouth at bedtime. 10/15/21   Kathie Dike, MD  rosuvastatin (CRESTOR) 40 MG tablet Take 40 mg by mouth daily.    [provider]  testosterone cypionate (DEPOTESTOSTERONE CYPIONATE) 200 MG/ML injection Inject 200 mg into the muscle every 14 (fourteen) days.  04/29/16   [provider]  TOUJEO SOLOSTAR 300 UNIT/ML Solostar Pen Inject 30 Units into the skin 2 (two) times daily. 10/15/21   Kathie Dike, MD      Allergies    Bee venom, Adhesive [tape], Ciprofloxacin, Codeine, Iodine, Povidone-iodine, Simvastatin, and Latex    Review of Systems   Review of Systems  Constitutional:  Negative for fever.  HENT:  Negative for nosebleeds and sore throat.   Respiratory:  Positive for cough and shortness of breath.   Cardiovascular:  Positive for chest pain.  Gastrointestinal:  Positive for abdominal pain, blood in stool and hematochezia. Negative for hematemesis and vomiting.  Genitourinary:  Positive for hematuria.  Neurological:  Negative for loss of consciousness.    Physical Exam Updated Vital Signs BP 138/83 (BP Location: Right Arm)   Pulse 89   Temp 98.5 F (36.9 C) (Oral)   Resp (!) 23   Ht 5\' 11"  (1.803 m)   Wt 98.8 kg   SpO2 (!) 89%   BMI 30.38 kg/m  Physical Exam Vitals and nursing note reviewed.  Constitutional:      General: He is not in acute distress.    Appearance: Normal appearance. He is well-developed.  HENT:     Head: Normocephalic and atraumatic.  Eyes:     Conjunctiva/sclera: Conjunctivae normal.  Cardiovascular:     Rate and Rhythm: Normal rate and regular rhythm.     Heart sounds: No murmur heard. Pulmonary:     Effort: Pulmonary effort is normal. No respiratory distress.     Breath sounds: Normal breath sounds.  Abdominal:     Palpations: Abdomen is soft.     Tenderness: There is no abdominal tenderness. There is no guarding or  rebound.  Musculoskeletal:        General: No deformity.     Cervical back: Neck supple.     Right lower leg: No edema.     Left lower leg: No edema.  Skin:    General: Skin is warm and dry.     Capillary Refill: Capillary refill takes less than 2 seconds.  Neurological:     General: No focal deficit present.     Mental Status: He is alert.     ED Results / Procedures / Treatments   Labs (all labs ordered are listed, but only abnormal results are displayed) Labs Reviewed  COMPREHENSIVE METABOLIC PANEL - Abnormal; Notable for the following components:      Result Value   Potassium 3.2 (*)    Glucose, Bld 149 (*)    BUN 29 (*)    Creatinine, Ser 1.59 (*)    Calcium 8.6 (*)    GFR, Estimated 42 (*)    All other components within normal limits  CBC WITH DIFFERENTIAL/PLATELET - Abnormal; Notable for the following components:   RBC 3.58 (*)    Hemoglobin 10.6 (*)    HCT 32.6 (*)    RDW 15.8 (*)    Platelets 143 (*)    All other components within normal limits  URINALYSIS, W/ REFLEX TO CULTURE (INFECTION SUSPECTED) - Abnormal; Notable for the following components:   Hgb urine dipstick MODERATE (*)    All other components within normal limits  GLUCOSE, CAPILLARY - Abnormal; Notable for the following components:   Glucose-Capillary 150 (*)    All other components within normal limits  CULTURE, BLOOD (SINGLE)  RESP PANEL BY RT-PCR (RSV, FLU A&B, COVID)  RVPGX2  LACTIC ACID, PLASMA  PROTIME-INR  APTT  COMPREHENSIVE METABOLIC PANEL  MAGNESIUM  CBC  HEMOGLOBIN A1C  POC OCCULT BLOOD, ED  TYPE AND SCREEN  TROPONIN I (HIGH SENSITIVITY)  TROPONIN I (HIGH SENSITIVITY)    EKG EKG Interpretation  Date/Time:  Saturday October 26 2022 08:51:53 EST Ventricular Rate:  105 PR Interval:    QRS Duration: 91 QT Interval:  331 QTC Calculation: 429 R Axis:   -46 Text Interpretation: sinus rhythm Paired ventricular premature complexes Left anterior fascicular block Abnormal R-wave  progression, late transition increased ectopy from prior 1/23 Confirmed by Aletta Edouard [phone]) on 10/26/2022 9:08:26 AM  Radiology CT CHEST ABDOMEN PELVIS W CONTRAST  Result Date: 10/26/2022 CLINICAL DATA:  Cough and congestion for 2 weeks. Now with hemoptysis. Patient also reports rectal bleeding. History of colon and prostate cancer. Status post prostatectomy. * Tracking Code: BO * EXAM: CT CHEST, ABDOMEN, AND PELVIS WITH CONTRAST TECHNIQUE: Multidetector CT imaging of the chest, abdomen and pelvis was performed following the standard protocol during bolus administration of intravenous contrast. RADIATION DOSE REDUCTION: This exam was performed according to the departmental dose-optimization program which includes automated exposure control, adjustment of the mA and/or kV according to patient size and/or use of iterative reconstruction technique. CONTRAST:  4mL OMNIPAQUE IOHEXOL 300 MG/ML  SOLN COMPARISON:  CT chest, abdomen and pelvis 05/19/2021 FINDINGS: CT CHEST FINDINGS Cardiovascular: Aortic atherosclerosis. Normal heart size. No pericardial effusion. Mediastinum/Nodes: Thyroid gland, trachea, and esophagus are unremarkable. -left supraclavicular lymph node is new measuring 1.6 cm, image 7/2. -Subcarinal lymph node is enlarged measuring 1.9 cm, image 31/2. -new left paratracheal lymph node measures 1.5 cm, image 26/2. -mass versus conglomerate adenopathy in the left hilum measures 2.7 by 5.8 by 5.5 cm, image 31/2 and coronal image 105/3. Lungs/Pleura: -Within the superior segment of the right lower lobe there is a spiculated nodule measuring 1.4 cm, image 64/5. New from previous exam. -Tiny solid nodule within the posterior right apex measures 5 mm, image 47/5. New from the prior exam No pleural fluid or airspace disease. No atelectasis or pneumothorax identified Musculoskeletal: Status post ACDF within lower cervical spine. No acute or suspicious osseous findings. CT ABDOMEN PELVIS FINDINGS  Hepatobiliary: No suspicious liver abnormality. Gallstones are noted measuring up to 7 mm. No gallbladder wall inflammation or signs of bile duct dilatation. Pancreas: Unremarkable. No pancreatic ductal dilatation or surrounding inflammatory changes. Spleen: Normal in size without focal  abnormality. Adrenals/Urinary Tract: Bilateral Bosniak class 1 cysts are identified. These measure up to 3.6 cm. No follow-up recommended. Slightly exophytic lesion arising off the posterior right kidney does not meet CT criteria for a benign cyst measuring 9 mm and 50 Hounsfield units, image 76/2. This may represent a small enhancing lesion or hemorrhagic/proteinaceous cyst. No hydronephrosis or nephrolithiasis. Urinary bladder is unremarkable. Stomach/Bowel: Stomach appears normal. The appendix is visualized and appears within normal limits. No pathologic dilatation of the large or small bowel loops. Distal colonic diverticulosis without signs of acute diverticulitis. No obstructing mass noted. Vascular/Lymphatic: Aortic atherosclerosis. No enlarged abdominal or pelvic lymph nodes. Reproductive: Prostate gland is surgically absent. Penile prosthesis reservoir is noted within the right inguinal region. Other: No free fluid or fluid collections. Musculoskeletal: Stable sclerotic lesion within the right iliac bone which is favored to represent a benign bone island no suspicious bone lesions identified. No acute osseous findings. IMPRESSION: 1. There is a spiculated nodule within the superior segment of the right lower lobe which is new from previous exam and worrisome for primary bronchogenic carcinoma. 2. Enlarged left supraclavicular, left paratracheal subcarinal and left hilar lymph nodes compatible with metastatic adenopathy. 3. No signs of metastatic disease within the abdomen or pelvis. 4. Gallstones. 5. Slightly exophytic lesion arising off the posterior right kidney does not meet CT criteria for a benign cyst measuring 9 mm  and 50 Hounsfield units. This may represent a small enhancing lesion or hemorrhagic/proteinaceous cyst. This does not require emergent attention. However, when the patient is clinically stable consider more definitive characterization with dedicated, outpatient renal protocol CT or MRI. Alternatively referral to urologist may be considered further management. 6. Status post prostatectomy. 7.  Aortic Atherosclerosis (ICD10-I70.0). Electronically Signed   By: Kerby Moors M.D.   On: 10/26/2022 11:35   DG Chest Port 1 View  Result Date: 10/26/2022 CLINICAL DATA:  Possible sepsis.  Cough and congestion for 2 weeks EXAM: PORTABLE CHEST 1 VIEW COMPARISON:  10/09/2021 FINDINGS: Numerous leads and wires project over the chest. Lower cervical spine fixation. Moderate right hemidiaphragm elevation. Midline trachea. Normal heart size for level of inspiration. Atherosclerosis in the transverse aorta. Possible tiny left pleural effusion. No pneumothorax. No congestive failure. Subtle obscuration of the left hemidiaphragm. IMPRESSION: Possible tiny left pleural effusion and adjacent left base airspace disease which could represent atelectasis or early infection. If the patient can undergo PA and lateral radiographs, these should be considered. Aortic Atherosclerosis (ICD10-I70.0). Electronically Signed   By: Abigail Miyamoto M.D.   On: 10/26/2022 09:45    Procedures Procedures    Medications Ordered in ED Medications  pneumococcal 20-valent conjugate vaccine (PREVNAR 20) injection 0.5 mL (has no administration in time range)  lactated ringers infusion ( Intravenous New Bag/Given 10/26/22 1724)  amLODipine (NORVASC) tablet 5 mg (has no administration in time range)  atorvastatin (LIPITOR) tablet 40 mg (40 mg Oral Given 10/26/22 1723)  propranolol ER (INDERAL LA) 24 hr capsule 80 mg (has no administration in time range)  LORazepam (ATIVAN) tablet 0.5 mg (has no administration in time range)  fluticasone  furoate-vilanterol (BREO ELLIPTA) 200-25 MCG/ACT 1 puff (has no administration in time range)  fluticasone (FLONASE) 50 MCG/ACT nasal spray 1 spray (has no administration in time range)  chlorpheniramine-HYDROcodone (TUSSIONEX) 10-8 MG/5ML suspension 5 mL (5 mLs Oral Given 10/26/22 1723)  guaiFENesin (MUCINEX) 12 hr tablet 600 mg (has no administration in time range)  insulin glargine-yfgn (SEMGLEE) injection 40 Units (has no administration in time range)  insulin aspart (novoLOG) injection 0-9 Units (has no administration in time range)  insulin aspart (novoLOG) injection 10 Units (has no administration in time range)  acetaminophen (TYLENOL) tablet 650 mg (has no administration in time range)    Or  acetaminophen (TYLENOL) suppository 650 mg (has no administration in time range)  oxyCODONE (Oxy IR/ROXICODONE) immediate release tablet 5 mg (has no administration in time range)  fentaNYL (SUBLIMAZE) injection 12.5 mcg (has no administration in time range)  bisacodyl (DULCOLAX) EC tablet 5 mg (has no administration in time range)  ondansetron (ZOFRAN) tablet 4 mg (has no administration in time range)    Or  ondansetron (ZOFRAN) injection 4 mg (has no administration in time range)  ipratropium-albuterol (DUONEB) 0.5-2.5 (3) MG/3ML nebulizer solution 3 mL (has no administration in time range)  pantoprazole (PROTONIX) injection 40 mg (40 mg Intravenous Given 10/26/22 0934)  iohexol (OMNIPAQUE) 300 MG/ML solution 80 mL (80 mLs Intravenous Contrast Given 10/26/22 1057)  potassium chloride SA (KLOR-CON M) CR tablet 40 mEq (40 mEq Oral Given 10/26/22 1620)    ED Course/ Medical Decision Making/ A&P Clinical Course as of 10/26/22 1743  Sat Oct 26, 2022  0909 Reviewed primary care notes, he was treated sometime in January with doxycycline for productive cough dizziness. [MB]  [id number] Chest x-ray interpreted by me as elevated right hemidiaphragm no definite infiltrate.  Awaiting radiology reading. [MB]  1005  Rectal exam done with nurse Caryl Pina as chaperone.  Normal tone no masses.  Guaiac negative [MB]  1341 Reviewed case with Dr. Delton Coombes.  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 Wynetta Emery who will evaluate patient for admission. [MB]    Clinical Course User Index [MB] Hayden Rasmussen, MD                             Medical Decision Making Amount and/or Complexity of Data Reviewed Labs: ordered. Radiology: ordered.  Risk Prescription drug management. Decision regarding hospitalization.   This patient complains of cough hemoptysis, chest pain abdominal pain, hematuria possible rectal bleeding; this involves an extensive number of treatment Options and is a complaint that carries with it a high risk of complications and morbidity. The differential includes upper GI bleed, lower GI bleed, hemoptysis, tumor, mass, bronchitis, symptomatic anemia  I ordered, reviewed and interpreted labs, which included CBC with normal white count, hemoglobin lower than priors, chemistries with low potassium chronic CKD, COVID and flu negative, coags normal, urinalysis without signs of infection, fecal occult negative, troponins flat I ordered medication IV PPI oral potassium and reviewed PMP when indicated. I ordered imaging studies which included chest x-ray, CT chest abdomen and pelvis and I independently    visualized and interpreted imaging which showed spiculated mass in the lungs with lymphadenopathy Additional history obtained from patient's wife Previous records obtained and reviewed in epic including recent PCP visits I consulted Dr. Delton Coombes oncology and [facility] hospitalist Dr. Wynetta Emery and discussed lab and imaging findings and discussed disposition.  Cardiac monitoring reviewed, normal sinus rhythm Social determinants considered, no significant barriers Critical  Interventions: None  After the interventions stated above, I reevaluated the patient and found patient to be resting comfortably in no distress Admission and further testing considered, he would benefit from mission to the hospital for serial hemoglobins and observation of possible hemoptysis.  May also  need further workup for his new lymphadenopathy and lung mass.  Patient in agreement with plan for admission.         Final Clinical Impression(s) / ED Diagnoses Final diagnoses:  Renal lesion  Lung mass  Cough with hemoptysis  Hypokalemia    Rx / DC Orders ED Discharge Orders     None         Hayden Rasmussen, MD 10/26/22 1745

## 2022-10-27 ENCOUNTER — Encounter (HOSPITAL_COMMUNITY): Payer: Self-pay | Admitting: Family Medicine

## 2022-10-27 DIAGNOSIS — R0602 Shortness of breath: Secondary | ICD-10-CM | POA: Diagnosis not present

## 2022-10-27 DIAGNOSIS — F17210 Nicotine dependence, cigarettes, uncomplicated: Secondary | ICD-10-CM | POA: Diagnosis present

## 2022-10-27 DIAGNOSIS — R590 Localized enlarged lymph nodes: Secondary | ICD-10-CM | POA: Diagnosis not present

## 2022-10-27 DIAGNOSIS — C8313 Mantle cell lymphoma, intra-abdominal lymph nodes: Secondary | ICD-10-CM | POA: Diagnosis present

## 2022-10-27 DIAGNOSIS — Z1152 Encounter for screening for COVID-19: Secondary | ICD-10-CM | POA: Diagnosis not present

## 2022-10-27 DIAGNOSIS — I129 Hypertensive chronic kidney disease with stage 1 through stage 4 chronic kidney disease, or unspecified chronic kidney disease: Secondary | ICD-10-CM | POA: Diagnosis present

## 2022-10-27 DIAGNOSIS — I700 Atherosclerosis of aorta: Secondary | ICD-10-CM | POA: Diagnosis present

## 2022-10-27 DIAGNOSIS — G4733 Obstructive sleep apnea (adult) (pediatric): Secondary | ICD-10-CM | POA: Diagnosis present

## 2022-10-27 DIAGNOSIS — K219 Gastro-esophageal reflux disease without esophagitis: Secondary | ICD-10-CM | POA: Diagnosis present

## 2022-10-27 DIAGNOSIS — E871 Hypo-osmolality and hyponatremia: Secondary | ICD-10-CM | POA: Diagnosis present

## 2022-10-27 DIAGNOSIS — E1122 Type 2 diabetes mellitus with diabetic chronic kidney disease: Secondary | ICD-10-CM | POA: Diagnosis present

## 2022-10-27 DIAGNOSIS — E782 Mixed hyperlipidemia: Secondary | ICD-10-CM | POA: Diagnosis present

## 2022-10-27 DIAGNOSIS — C61 Malignant neoplasm of prostate: Secondary | ICD-10-CM | POA: Diagnosis not present

## 2022-10-27 DIAGNOSIS — D62 Acute posthemorrhagic anemia: Secondary | ICD-10-CM | POA: Diagnosis present

## 2022-10-27 DIAGNOSIS — G629 Polyneuropathy, unspecified: Secondary | ICD-10-CM | POA: Diagnosis not present

## 2022-10-27 DIAGNOSIS — K51911 Ulcerative colitis, unspecified with rectal bleeding: Secondary | ICD-10-CM | POA: Diagnosis present

## 2022-10-27 DIAGNOSIS — R042 Hemoptysis: Secondary | ICD-10-CM | POA: Diagnosis not present

## 2022-10-27 DIAGNOSIS — R918 Other nonspecific abnormal finding of lung field: Secondary | ICD-10-CM | POA: Diagnosis not present

## 2022-10-27 DIAGNOSIS — Z794 Long term (current) use of insulin: Secondary | ICD-10-CM | POA: Diagnosis not present

## 2022-10-27 DIAGNOSIS — E1151 Type 2 diabetes mellitus with diabetic peripheral angiopathy without gangrene: Secondary | ICD-10-CM | POA: Diagnosis present

## 2022-10-27 DIAGNOSIS — E1142 Type 2 diabetes mellitus with diabetic polyneuropathy: Secondary | ICD-10-CM | POA: Diagnosis present

## 2022-10-27 DIAGNOSIS — C3431 Malignant neoplasm of lower lobe, right bronchus or lung: Secondary | ICD-10-CM | POA: Diagnosis present

## 2022-10-27 DIAGNOSIS — I2510 Atherosclerotic heart disease of native coronary artery without angina pectoris: Secondary | ICD-10-CM | POA: Diagnosis present

## 2022-10-27 DIAGNOSIS — Z23 Encounter for immunization: Secondary | ICD-10-CM | POA: Diagnosis present

## 2022-10-27 DIAGNOSIS — E1165 Type 2 diabetes mellitus with hyperglycemia: Secondary | ICD-10-CM | POA: Diagnosis present

## 2022-10-27 DIAGNOSIS — E669 Obesity, unspecified: Secondary | ICD-10-CM | POA: Diagnosis present

## 2022-10-27 DIAGNOSIS — N1832 Chronic kidney disease, stage 3b: Secondary | ICD-10-CM | POA: Diagnosis present

## 2022-10-27 DIAGNOSIS — E876 Hypokalemia: Secondary | ICD-10-CM | POA: Diagnosis present

## 2022-10-27 LAB — URINALYSIS, ROUTINE W REFLEX MICROSCOPIC
Bilirubin Urine: NEGATIVE
Glucose, UA: 150 mg/dL — AB
Hgb urine dipstick: NEGATIVE
Ketones, ur: NEGATIVE mg/dL
Leukocytes,Ua: NEGATIVE
Nitrite: NEGATIVE
Protein, ur: NEGATIVE mg/dL
Specific Gravity, Urine: 1.017 (ref 1.005–1.030)
pH: 5 (ref 5.0–8.0)

## 2022-10-27 LAB — GLUCOSE, CAPILLARY
Glucose-Capillary: 108 mg/dL — ABNORMAL HIGH (ref 70–99)
Glucose-Capillary: 143 mg/dL — ABNORMAL HIGH (ref 70–99)
Glucose-Capillary: 249 mg/dL — ABNORMAL HIGH (ref 70–99)
Glucose-Capillary: 51 mg/dL — ABNORMAL LOW (ref 70–99)
Glucose-Capillary: 82 mg/dL (ref 70–99)
Glucose-Capillary: 87 mg/dL (ref 70–99)
Glucose-Capillary: 76 mg/dL (ref 70–99)
Glucose-Capillary: 140 mg/dL — ABNORMAL HIGH (ref 70–99)

## 2022-10-27 LAB — COMPREHENSIVE METABOLIC PANEL
ALT: 12 U/L (ref 0–44)
AST: 16 U/L (ref 15–41)
Albumin: 3 g/dL — ABNORMAL LOW (ref 3.5–5.0)
Alkaline Phosphatase: 88 U/L (ref 38–126)
Anion gap: 8 (ref 5–15)
BUN: 22 mg/dL (ref 8–23)
CO2: 26 mmol/L (ref 22–32)
Calcium: 8.5 mg/dL — ABNORMAL LOW (ref 8.9–10.3)
Chloride: 104 mmol/L (ref 98–111)
Creatinine, Ser: 1.45 mg/dL — ABNORMAL HIGH (ref 0.61–1.24)
GFR, Estimated: 47 mL/min — ABNORMAL LOW (ref >60)
Glucose, Bld: 178 mg/dL — ABNORMAL HIGH (ref 70–99)
Potassium: 4.3 mmol/L (ref 3.5–5.1)
Sodium: 138 mmol/L (ref 135–145)
Total Bilirubin: 0.8 mg/dL (ref 0.3–1.2)
Total Protein: 5.9 g/dL — ABNORMAL LOW (ref 6.5–8.1)

## 2022-10-27 LAB — CBC
HCT: 31.2 % — ABNORMAL LOW (ref 39.0–52.0)
Hemoglobin: 10 g/dL — ABNORMAL LOW (ref 13.0–17.0)
MCH: 29.9 pg (ref 26.0–34.0)
MCHC: 32.1 g/dL (ref 30.0–36.0)
MCV: 93.4 fL (ref 80.0–100.0)
Platelets: 144 10*3/uL — ABNORMAL LOW (ref 150–400)
RBC: 3.34 MIL/uL — ABNORMAL LOW (ref 4.22–5.81)
RDW: 16.1 % — ABNORMAL HIGH (ref 11.5–15.5)
WBC: 5.8 10*3/uL (ref 4.0–10.5)
nRBC: 0 % (ref 0.0–0.2)

## 2022-10-27 LAB — HEMOGLOBIN A1C
Hgb A1c MFr Bld: 7.2 % — ABNORMAL HIGH (ref 4.8–5.6)
Mean Plasma Glucose: 159.94 mg/dL

## 2022-10-27 LAB — MAGNESIUM: Magnesium: 1.4 mg/dL — ABNORMAL LOW (ref 1.7–2.4)

## 2022-10-27 MED ORDER — DEXTROMETHORPHAN POLISTIREX ER 30 MG/5ML PO SUER
30.0000 mg | Freq: Two times a day (BID) | ORAL | Status: DC
  Administered 2022-10-27 – 2022-10-29 (×5): 30 mg via ORAL
  Filled 2022-10-27 (×5): qty 5

## 2022-10-27 MED ORDER — INSULIN GLARGINE-YFGN 100 UNIT/ML ~~LOC~~ SOLN
24.0000 [IU] | Freq: Every day | SUBCUTANEOUS | Status: DC
  Administered 2022-10-28: 24 [IU] via SUBCUTANEOUS
  Filled 2022-10-27 (×3): qty 0.24

## 2022-10-27 MED ORDER — MAGNESIUM SULFATE 4 GM/100ML IV SOLN
4.0000 g | Freq: Once | INTRAVENOUS | Status: AC
  Administered 2022-10-27: 4 g via INTRAVENOUS
  Filled 2022-10-27: qty 100

## 2022-10-27 MED ORDER — INSULIN ASPART 100 UNIT/ML IJ SOLN
8.0000 [IU] | Freq: Three times a day (TID) | INTRAMUSCULAR | Status: DC
  Administered 2022-10-28 (×2): 8 [IU] via SUBCUTANEOUS

## 2022-10-27 NOTE — Progress Notes (Signed)
Patient coughing and requesting something for this. Advised I would check with MD because it was too soon for more Tussionex and Robitussin was flagging as an allergy. Per pt he could not recall reaction to it. Notified MD C. Hall, new order for Robitussin and one time duo-neb. Robitussin administered and patient went to sleep shortly after. Respiratory in to administer nebs and patient refused stating he was going to sleep and did not want it. Will continue to monitor.

## 2022-10-27 NOTE — Progress Notes (Signed)
PROGRESS NOTE   MOROCCO GIPE  XAJ:[id number] DOB: 25-Jun-1935 DOA: 10/26/2022 PCP: Celene Squibb, MD   Chief Complaint  Patient presents with   Rectal Bleeding   Hemoptysis   Level of care: Med-Surg  Brief Admission History:  88 year old gentleman with history of mantle cell lymphoma colon cancer December 2014 treated with chemotherapy and has remained in remission, ulcerative colitis in remission off therapy, hypertension, hyperlipidemia, OSA, B12 deficiency, history of prostate cancer, coronary artery disease, GERD, hyperlipidemia, peripheral vascular disease, type 2 diabetes mellitus with chronic kidney disease presents to the emergency department with his wife complaining of cough and chest congestion for the past 2 weeks and now he is coughing up blood that is dark in color.  He reports that he coughed up blood most of the last 24 hours and increasing amounts of blood reported.  He also reports that he has noticed some rectal bleeding.  He reports that his cough seems to be worse every time he eats and he has not been able to eat in the last 24 hours.  He reports that every time he tries to eat something he starts coughing.  He also reports malaise and fatigue.  He reports having 2 black bowel movements yesterday.  He also reports that he saw blood in his urine.  He is not on any blood thinner medications.  His respiratory panel was negative for RSV influenza and coronavirus.  The patient sent for CT scan of the chest abdomen and pelvis with findings of a spiculated nodule in the superior segment of the right lower lobe concerning for new bronchogenic carcinoma.  His Hg is 10.6.  After discussing with oncologist patient was given option of outpatient follow up.  However patient is feeling too weak to go home and afraid of worsening hemoptysis over next 24 hours.  He will be admitted for observation and serial Hg testing and symptom management of cough and evaluate for rectal bleeding.       Assessment and Plan:  Right lower lobe mass with mediastinal lymphadenopathy -Highly suspicious for bronchogenic carcinoma -Pt insists on seeing oncology and pulmonology inpatient vs outpatient follow up -he may require bronchoscopy and pending further pathology findings to determine treatments -pt would like to pursue full treatments as he says he has "beaten cancer before." -will ask oncology and pulmonology to see on 2/5 when available to consult   Hemoptysis -will give cough suppressants as needed -follow Hg closely  -pulmonary consultation will be requested as soon as available    Hematuria -check urinalysis (pending)   Hypokalemia -oral replacement given -recheck in AM with Mg -repleted   Hypomagnesemia  -IV magnesium ordered, recheck in AM    Type 2 DM with renal complications - sensitive SSI and frequent CBG monitoring - carb modified diet  CBG (last 3)  Recent Labs    10/27/22 0318 10/27/22 0737 10/27/22 1138  GLUCAP 108* 143* 249*    Stage 3b CKD -stable from recent testing -follow BMP -renally dose medications   CAD -no active chest pain -transfuse PRBC for Hg<8  DVT prophylaxis: SCDs Code Status: Full  Family Communication: wife 2/3   Consultants:  Oncology Pulmonology  Procedures:   Antimicrobials:    Subjective: Pt awake, alert, still having hemoptysis, insists on seeing oncology and pulmonary.  Objective: Vitals:   10/26/22 2009 10/26/22 2112 10/27/22 0323 10/27/22 0808  BP:  137/70 126/78   Pulse:  94 63   Resp:  20 20   Temp:  97.9 F (36.6 C) 97.6 F (36.4 C)   TempSrc:  Oral Oral   SpO2: 96% 92% 97% (!) 88%  Weight:      Height:        Intake/Output Summary (Last 24 hours) at 10/27/2022 1202 Last data filed at 10/27/2022 0400 Gross per 24 hour  Intake 2473.9 ml  Output --  Net 2473.9 ml   Filed Weights   10/26/22 0848 10/26/22 1604  Weight: 100.7 kg 98.8 kg   Examination:  General exam: Appears calm and  comfortable  Respiratory system: rales on right heard posteriorly. Respiratory effort normal. Cardiovascular system: normal S1 & S2 heard. No JVD, murmurs, rubs, gallops or clicks. No pedal edema. Gastrointestinal system: Abdomen is nondistended, soft and nontender. No organomegaly or masses felt. Normal bowel sounds heard. Central nervous system: Alert and oriented. No focal neurological deficits. Extremities: Symmetric 5 x 5 power. Skin: No rashes, lesions or ulcers. Psychiatry: Judgement and insight appear normal. Mood & affect appropriate.   Data Reviewed: I have personally reviewed following labs and imaging studies  CBC: Recent Labs  Lab 10/26/22 0902 10/27/22 0532  WBC 6.8 5.8  NEUTROABS 5.1  --   HGB 10.6* 10.0*  HCT 32.6* 31.2*  MCV 91.1 93.4  PLT 143* 144*    Basic Metabolic Panel: Recent Labs  Lab 10/26/22 0902 10/27/22 0532  NA 138 138  K 3.2* 4.3  CL 103 104  CO2 22 26  GLUCOSE 149* 178*  BUN 29* 22  CREATININE 1.59* 1.45*  CALCIUM 8.6* 8.5*  MG  --  1.4*    CBG: Recent Labs  Lab 10/26/22 1711 10/26/22 2159 10/27/22 0318 10/27/22 0737 10/27/22 1138  GLUCAP 150* 212* 108* 143* 249*    Recent Results (from the past 240 hour(s))  Resp panel by RT-PCR (RSV, Flu A&B, Covid) Anterior Nasal Swab     Status: None   Collection Time: 10/26/22  9:00 AM   Specimen: Anterior Nasal Swab  Result Value Ref Range Status   SARS Coronavirus 2 by RT PCR NEGATIVE NEGATIVE Final    Comment: (NOTE) SARS-CoV-2 target nucleic acids are NOT DETECTED.  The SARS-CoV-2 RNA is generally detectable in upper respiratory specimens during the acute phase of infection. The lowest concentration of SARS-CoV-2 viral copies this assay can detect is 138 copies/mL. A negative result does not preclude SARS-Cov-2 infection and should not be used as the sole basis for treatment or other patient management decisions. A negative result may occur with  improper specimen  collection/handling, submission of specimen other than nasopharyngeal swab, presence of viral mutation(s) within the areas targeted by this assay, and inadequate number of viral copies(<138 copies/mL). A negative result must be combined with clinical observations, patient history, and epidemiological information. The expected result is Negative.  Fact Sheet for Patients:  EntrepreneurPulse.com.au  Fact Sheet for Healthcare Providers:  IncredibleEmployment.be  This test is no t yet approved or cleared by the [place] FDA and  has been authorized for detection and/or diagnosis of SARS-CoV-2 by FDA under an Emergency Use Authorization (EUA). This EUA will remain  in effect (meaning this test can be used) for the duration of the COVID-19 declaration under Section 564(b)(1) of the Act, 21 U.S.C.section 360bbb-3(b)(1), unless the authorization is terminated  or revoked sooner.       Influenza A by PCR NEGATIVE NEGATIVE Final   Influenza B by PCR NEGATIVE NEGATIVE Final    Comment: (NOTE) The Xpert Xpress SARS-CoV-2/FLU/RSV plus assay is intended as  an aid in the diagnosis of influenza from Nasopharyngeal swab specimens and should not be used as a sole basis for treatment. Nasal washings and aspirates are unacceptable for Xpert Xpress SARS-CoV-2/FLU/RSV testing.  Fact Sheet for Patients: EntrepreneurPulse.com.au  Fact Sheet for Healthcare Providers: IncredibleEmployment.be  This test is not yet approved or cleared by the [place] FDA and has been authorized for detection and/or diagnosis of SARS-CoV-2 by FDA under an Emergency Use Authorization (EUA). This EUA will remain in effect (meaning this test can be used) for the duration of the COVID-19 declaration under Section 564(b)(1) of the Act, 21 U.S.C. section 360bbb-3(b)(1), unless the authorization is terminated or revoked.     Resp Syncytial  Virus by PCR NEGATIVE NEGATIVE Final    Comment: (NOTE) Fact Sheet for Patients: EntrepreneurPulse.com.au  Fact Sheet for Healthcare Providers: IncredibleEmployment.be  This test is not yet approved or cleared by the [place] FDA and has been authorized for detection and/or diagnosis of SARS-CoV-2 by FDA under an Emergency Use Authorization (EUA). This EUA will remain in effect (meaning this test can be used) for the duration of the COVID-19 declaration under Section 564(b)(1) of the Act, 21 U.S.C. section 360bbb-3(b)(1), unless the authorization is terminated or revoked.  Performed at [facility], [address] [place], [place] [address]   Blood culture (routine single)     Status: None (Preliminary result)   Collection Time: 10/26/22  9:03 AM   Specimen: Right Antecubital; Blood  Result Value Ref Range Status   Specimen Description   Final    RIGHT ANTECUBITAL BOTTLES DRAWN AEROBIC AND ANAEROBIC   Special Requests   Final    Blood Culture adequate volume Performed at [facility], [address], [place], [place] [address]    Culture PENDING  Incomplete   Report Status PENDING  Incomplete     Radiology Studies: CT CHEST ABDOMEN PELVIS W CONTRAST  Result Date: 10/26/2022 CLINICAL DATA:  Cough and congestion for 2 weeks. Now with hemoptysis. Patient also reports rectal bleeding. History of colon and prostate cancer. Status post prostatectomy. * Tracking Code: BO * EXAM: CT CHEST, ABDOMEN, AND PELVIS WITH CONTRAST TECHNIQUE: Multidetector CT imaging of the chest, abdomen and pelvis was performed following the standard protocol during bolus administration of intravenous contrast. RADIATION DOSE REDUCTION: This exam was performed according to the departmental dose-optimization program which includes automated exposure control, adjustment of the mA and/or kV according to patient size and/or use of iterative reconstruction technique. CONTRAST:   61mL OMNIPAQUE IOHEXOL 300 MG/ML  SOLN COMPARISON:  CT chest, abdomen and pelvis 05/19/2021 FINDINGS: CT CHEST FINDINGS Cardiovascular: Aortic atherosclerosis. Normal heart size. No pericardial effusion. Mediastinum/Nodes: Thyroid gland, trachea, and esophagus are unremarkable. -left supraclavicular lymph node is new measuring 1.6 cm, image 7/2. -Subcarinal lymph node is enlarged measuring 1.9 cm, image 31/2. -new left paratracheal lymph node measures 1.5 cm, image 26/2. -mass versus conglomerate adenopathy in the left hilum measures 2.7 by 5.8 by 5.5 cm, image 31/2 and coronal image 105/3. Lungs/Pleura: -Within the superior segment of the right lower lobe there is a spiculated nodule measuring 1.4 cm, image 64/5. New from previous exam. -Tiny solid nodule within the posterior right apex measures 5 mm, image 47/5. New from the prior exam No pleural fluid or airspace disease. No atelectasis or pneumothorax identified Musculoskeletal: Status post ACDF within lower cervical spine. No acute or suspicious osseous findings. CT ABDOMEN PELVIS FINDINGS Hepatobiliary: No suspicious liver abnormality. Gallstones are noted measuring up to 7 mm.  No gallbladder wall inflammation or signs of bile duct dilatation. Pancreas: Unremarkable. No pancreatic ductal dilatation or surrounding inflammatory changes. Spleen: Normal in size without focal abnormality. Adrenals/Urinary Tract: Bilateral Bosniak class 1 cysts are identified. These measure up to 3.6 cm. No follow-up recommended. Slightly exophytic lesion arising off the posterior right kidney does not meet CT criteria for a benign cyst measuring 9 mm and 50 Hounsfield units, image 76/2. This may represent a small enhancing lesion or hemorrhagic/proteinaceous cyst. No hydronephrosis or nephrolithiasis. Urinary bladder is unremarkable. Stomach/Bowel: Stomach appears normal. The appendix is visualized and appears within normal limits. No pathologic dilatation of the large or small  bowel loops. Distal colonic diverticulosis without signs of acute diverticulitis. No obstructing mass noted. Vascular/Lymphatic: Aortic atherosclerosis. No enlarged abdominal or pelvic lymph nodes. Reproductive: Prostate gland is surgically absent. Penile prosthesis reservoir is noted within the right inguinal region. Other: No free fluid or fluid collections. Musculoskeletal: Stable sclerotic lesion within the right iliac bone which is favored to represent a benign bone island no suspicious bone lesions identified. No acute osseous findings. IMPRESSION: 1. There is a spiculated nodule within the superior segment of the right lower lobe which is new from previous exam and worrisome for primary bronchogenic carcinoma. 2. Enlarged left supraclavicular, left paratracheal subcarinal and left hilar lymph nodes compatible with metastatic adenopathy. 3. No signs of metastatic disease within the abdomen or pelvis. 4. Gallstones. 5. Slightly exophytic lesion arising off the posterior right kidney does not meet CT criteria for a benign cyst measuring 9 mm and 50 Hounsfield units. This may represent a small enhancing lesion or hemorrhagic/proteinaceous cyst. This does not require emergent attention. However, when the patient is clinically stable consider more definitive characterization with dedicated, outpatient renal protocol CT or MRI. Alternatively referral to urologist may be considered further management. 6. Status post prostatectomy. 7.  Aortic Atherosclerosis (ICD10-I70.0). Electronically Signed   By: Kerby Moors M.D.   On: 10/26/2022 11:35   DG Chest Port 1 View  Result Date: 10/26/2022 CLINICAL DATA:  Possible sepsis.  Cough and congestion for 2 weeks EXAM: PORTABLE CHEST 1 VIEW COMPARISON:  10/09/2021 FINDINGS: Numerous leads and wires project over the chest. Lower cervical spine fixation. Moderate right hemidiaphragm elevation. Midline trachea. Normal heart size for level of inspiration. Atherosclerosis in  the transverse aorta. Possible tiny left pleural effusion. No pneumothorax. No congestive failure. Subtle obscuration of the left hemidiaphragm. IMPRESSION: Possible tiny left pleural effusion and adjacent left base airspace disease which could represent atelectasis or early infection. If the patient can undergo PA and lateral radiographs, these should be considered. Aortic Atherosclerosis (ICD10-I70.0). Electronically Signed   By: Abigail Miyamoto M.D.   On: 10/26/2022 09:45    Scheduled Meds:  amLODipine  5 mg Oral Daily   atorvastatin  40 mg Oral QPM   dextromethorphan  30 mg Oral BID   fluticasone furoate-vilanterol  1 puff Inhalation Daily   guaiFENesin  600 mg Oral BID   insulin aspart  0-9 Units Subcutaneous TID WC   insulin aspart  10 Units Subcutaneous TID WC   insulin glargine-yfgn  40 Units Subcutaneous Daily   ipratropium-albuterol  3 mL Nebulization TID   ipratropium-albuterol  3 mL Nebulization STAT   propranolol ER  80 mg Oral QHS   Continuous Infusions:  lactated ringers 100 mL/hr at 10/27/22 1025     LOS: 0 days   Time spent: 35 mins  Cameryn Schum Wynetta Emery, MD How to contact the [facility] Attending or Consulting  provider [place] or covering provider during after hours [place], for this patient?  Check the care team in [facility] and look for a) attending/consulting [facility] provider listed and b) the [facility] Hights team listed Log into [email or web address] and use [place]'s universal password to access. If you do not have the password, please contact the hospital operator. Locate the [facility] provider you are looking for under [facility] and page to a number that you can be directly reached. If you still have difficulty reaching the provider, please page the [facility] (Director on Call) for the Hospitalists listed on amion for assistance.  10/27/2022, 12:02 PM

## 2022-10-28 ENCOUNTER — Other Ambulatory Visit: Payer: Self-pay

## 2022-10-28 ENCOUNTER — Inpatient Hospital Stay (HOSPITAL_COMMUNITY): Payer: Medicare Other

## 2022-10-28 DIAGNOSIS — R590 Localized enlarged lymph nodes: Secondary | ICD-10-CM

## 2022-10-28 DIAGNOSIS — G629 Polyneuropathy, unspecified: Secondary | ICD-10-CM | POA: Diagnosis not present

## 2022-10-28 DIAGNOSIS — C3490 Malignant neoplasm of unspecified part of unspecified bronchus or lung: Secondary | ICD-10-CM

## 2022-10-28 DIAGNOSIS — E876 Hypokalemia: Secondary | ICD-10-CM | POA: Diagnosis not present

## 2022-10-28 DIAGNOSIS — D62 Acute posthemorrhagic anemia: Secondary | ICD-10-CM | POA: Diagnosis not present

## 2022-10-28 DIAGNOSIS — C61 Malignant neoplasm of prostate: Secondary | ICD-10-CM

## 2022-10-28 DIAGNOSIS — C8319 Mantle cell lymphoma, extranodal and solid organ sites: Secondary | ICD-10-CM

## 2022-10-28 DIAGNOSIS — R918 Other nonspecific abnormal finding of lung field: Secondary | ICD-10-CM | POA: Diagnosis not present

## 2022-10-28 DIAGNOSIS — R042 Hemoptysis: Secondary | ICD-10-CM | POA: Diagnosis not present

## 2022-10-28 LAB — BASIC METABOLIC PANEL
Anion gap: 8 (ref 5–15)
BUN: 18 mg/dL (ref 8–23)
CO2: 25 mmol/L (ref 22–32)
Calcium: 8.3 mg/dL — ABNORMAL LOW (ref 8.9–10.3)
Chloride: 101 mmol/L (ref 98–111)
Creatinine, Ser: 1.36 mg/dL — ABNORMAL HIGH (ref 0.61–1.24)
GFR, Estimated: 51 mL/min — ABNORMAL LOW (ref >60)
Glucose, Bld: 241 mg/dL — ABNORMAL HIGH (ref 70–99)
Potassium: 4.3 mmol/L (ref 3.5–5.1)
Sodium: 134 mmol/L — ABNORMAL LOW (ref 135–145)

## 2022-10-28 LAB — CBC
HCT: 29.7 % — ABNORMAL LOW (ref 39.0–52.0)
Hemoglobin: 9.5 g/dL — ABNORMAL LOW (ref 13.0–17.0)
MCH: 30.1 pg (ref 26.0–34.0)
MCHC: 32 g/dL (ref 30.0–36.0)
MCV: 94 fL (ref 80.0–100.0)
Platelets: 138 10*3/uL — ABNORMAL LOW (ref 150–400)
RBC: 3.16 MIL/uL — ABNORMAL LOW (ref 4.22–5.81)
RDW: 16 % — ABNORMAL HIGH (ref 11.5–15.5)
WBC: 6.3 10*3/uL (ref 4.0–10.5)
nRBC: 0 % (ref 0.0–0.2)

## 2022-10-28 LAB — GLUCOSE, CAPILLARY
Glucose-Capillary: 284 mg/dL — ABNORMAL HIGH (ref 70–99)
Glucose-Capillary: 169 mg/dL — ABNORMAL HIGH (ref 70–99)
Glucose-Capillary: 196 mg/dL — ABNORMAL HIGH (ref 70–99)
Glucose-Capillary: 104 mg/dL — ABNORMAL HIGH (ref 70–99)
Glucose-Capillary: 113 mg/dL — ABNORMAL HIGH (ref 70–99)
Glucose-Capillary: 207 mg/dL — ABNORMAL HIGH (ref 70–99)

## 2022-10-28 LAB — MAGNESIUM: Magnesium: 1.9 mg/dL (ref 1.7–2.4)

## 2022-10-28 MED ORDER — GADOBUTROL 1 MMOL/ML IV SOLN
10.0000 mL | Freq: Once | INTRAVENOUS | Status: AC | PRN
  Administered 2022-10-28: 10 mL via INTRAVENOUS

## 2022-10-28 NOTE — Progress Notes (Signed)
PROGRESS NOTE   [name]  MEQ:[id number] DOB: 1935-04-22 DOA: 10/26/2022 PCP: Celene Squibb, MD   Chief Complaint  Patient presents with   Rectal Bleeding   Hemoptysis   Level of care: Med-Surg  Brief Admission History:  88 year old gentleman with history of mantle cell lymphoma colon cancer December 2014 treated with chemotherapy and has remained in remission, ulcerative colitis in remission off therapy, hypertension, hyperlipidemia, OSA, B12 deficiency, history of prostate cancer, coronary artery disease, GERD, hyperlipidemia, peripheral vascular disease, type 2 diabetes mellitus with chronic kidney disease presents to the emergency department with his wife complaining of cough and chest congestion for the past 2 weeks and now he is coughing up blood that is dark in color.  He reports that he coughed up blood most of the last 24 hours and increasing amounts of blood reported.  He also reports that he has noticed some rectal bleeding.  He reports that his cough seems to be worse every time he eats and he has not been able to eat in the last 24 hours.  He reports that every time he tries to eat something he starts coughing.  He also reports malaise and fatigue.  He reports having 2 black bowel movements yesterday.  He also reports that he saw blood in his urine.  He is not on any blood thinner medications.  His respiratory panel was negative for RSV influenza and coronavirus.  The patient sent for CT scan of the chest abdomen and pelvis with findings of a spiculated nodule in the superior segment of the right lower lobe concerning for new bronchogenic carcinoma.  His Hg is 10.6.  After discussing with oncologist patient was given option of outpatient follow up.  However patient is feeling too weak to go home and afraid of worsening hemoptysis over next 24 hours.  He will be admitted for observation and serial Hg testing and symptom management of cough and evaluate for rectal bleeding.       Assessment and Plan:  Right lower lobe mass with mediastinal lymphadenopathy -Highly suspicious for bronchogenic carcinoma -Pt insists on seeing oncology and pulmonology inpatient vs outpatient follow up -he may require bronchoscopy and pending further pathology findings to determine treatments -pt would like to pursue full treatments as he says he has "beaten cancer before." -will ask oncology and pulmonology to see on 2/5 when available to consult -discussed with oncology, requested MRI brain w/wo contrast for metastatic work up, requested tissue biopsy, further recommendations to follow -discussed with pulmonology, requested IR biopsy of left supraclavicular lymph node and then could pursue bronchoscopy    Hemoptysis -will give cough suppressants as needed -follow Hg closely  -pulmonary consultation  -oncology consultation     Hematuria- reported by patient  -checked urinalysis  -No RBC seen   Hypokalemia -oral replacement given -recheck in AM with Mg -repleted   Hypomagnesemia  -IV magnesium ordered -repleted    Type 2 DM with renal complications - sensitive SSI and frequent CBG monitoring - carb modified diet  CBG (last 3)  Recent Labs    10/28/22 0320 10/28/22 0759 10/28/22 1116  GLUCAP 284* 169* 196*    Stage 3b CKD -stable from recent testing -follow BMP -renally dose medications   CAD -no active chest pain -transfuse PRBC for Hg<8  DVT prophylaxis: SCDs Code Status: Full  Family Communication: wife 2/3   Consultants:  Oncology Pulmonology  Procedures:   Antimicrobials:    Subjective: Pt intermittently confused, he is reporting occasional  hemoptysis, no CP, no SOB, no fever.   Objective: Vitals:   10/27/22 2032 10/27/22 2201 10/28/22 0325 10/28/22 0730  BP:  115/74 121/69   Pulse:  67 63 (!) 56  Resp:  20 20 18   Temp:  98.9 F (37.2 C) 98.3 F (36.8 C)   TempSrc:  Oral Oral   SpO2: 91% 92% 91% 92%  Weight:      Height:         Intake/Output Summary (Last 24 hours) at 10/28/2022 1208 Last data filed at 10/28/2022 7412 Gross per 24 hour  Intake 2526.84 ml  Output 350 ml  Net 2176.84 ml   Filed Weights   10/26/22 0848 10/26/22 1604  Weight: 100.7 kg 98.8 kg   Examination:  General exam: Appears calm and comfortable  Respiratory system: rales on right heard posteriorly. Respiratory effort normal. Cardiovascular system: normal S1 & S2 heard. No JVD, murmurs, rubs, gallops or clicks. No pedal edema. Gastrointestinal system: Abdomen is nondistended, soft and nontender. No organomegaly or masses felt. Normal bowel sounds heard. Central nervous system: Alert and oriented. No focal neurological deficits. Extremities: Symmetric 5 x 5 power. Skin: No rashes, lesions or ulcers. Psychiatry: Judgement and insight appear diminished. Mood & affect appropriate.   Data Reviewed: I have personally reviewed following labs and imaging studies  CBC: Recent Labs  Lab 10/26/22 0902 10/27/22 0532 10/28/22 0502  WBC 6.8 5.8 6.3  NEUTROABS 5.1  --   --   HGB 10.6* 10.0* 9.5*  HCT 32.6* 31.2* 29.7*  MCV 91.1 93.4 94.0  PLT 143* 144* 138*    Basic Metabolic Panel: Recent Labs  Lab 10/26/22 0902 10/27/22 0532 10/28/22 0502  NA 138 138 134*  K 3.2* 4.3 4.3  CL 103 104 101  CO2 22 26 25   GLUCOSE 149* 178* 241*  BUN 29* 22 18  CREATININE 1.59* 1.45* 1.36*  CALCIUM 8.6* 8.5* 8.3*  MG  --  1.4* 1.9    CBG: Recent Labs  Lab 10/27/22 1833 10/27/22 2202 10/28/22 0320 10/28/22 0759 10/28/22 1116  GLUCAP 76 140* 284* 169* 196*    Recent Results (from the past 240 hour(s))  Resp panel by RT-PCR (RSV, Flu A&B, Covid) Anterior Nasal Swab     Status: None   Collection Time: 10/26/22  9:00 AM   Specimen: Anterior Nasal Swab  Result Value Ref Range Status   SARS Coronavirus 2 by RT PCR NEGATIVE NEGATIVE Final    Comment: (NOTE) SARS-CoV-2 target nucleic acids are NOT DETECTED.  The SARS-CoV-2 RNA is generally  detectable in upper respiratory specimens during the acute phase of infection. The lowest concentration of SARS-CoV-2 viral copies this assay can detect is 138 copies/mL. A negative result does not preclude SARS-Cov-2 infection and should not be used as the sole basis for treatment or other patient management decisions. A negative result may occur with  improper specimen collection/handling, submission of specimen other than nasopharyngeal swab, presence of viral mutation(s) within the areas targeted by this assay, and inadequate number of viral copies(<138 copies/mL). A negative result must be combined with clinical observations, patient history, and epidemiological information. The expected result is Negative.  Fact Sheet for Patients:  EntrepreneurPulse.com.au  Fact Sheet for Healthcare Providers:  IncredibleEmployment.be  This test is no t yet approved or cleared by the [place] FDA and  has been authorized for detection and/or diagnosis of SARS-CoV-2 by FDA under an Emergency Use Authorization (EUA). This EUA will remain  in effect (meaning this test  can be used) for the duration of the COVID-19 declaration under Section 564(b)(1) of the Act, 21 U.S.C.section 360bbb-3(b)(1), unless the authorization is terminated  or revoked sooner.       Influenza A by PCR NEGATIVE NEGATIVE Final   Influenza B by PCR NEGATIVE NEGATIVE Final    Comment: (NOTE) The Xpert Xpress SARS-CoV-2/FLU/RSV plus assay is intended as an aid in the diagnosis of influenza from Nasopharyngeal swab specimens and should not be used as a sole basis for treatment. Nasal washings and aspirates are unacceptable for Xpert Xpress SARS-CoV-2/FLU/RSV testing.  Fact Sheet for Patients: EntrepreneurPulse.com.au  Fact Sheet for Healthcare Providers: IncredibleEmployment.be  This test is not yet approved or cleared by the [place] FDA  and has been authorized for detection and/or diagnosis of SARS-CoV-2 by FDA under an Emergency Use Authorization (EUA). This EUA will remain in effect (meaning this test can be used) for the duration of the COVID-19 declaration under Section 564(b)(1) of the Act, 21 U.S.C. section 360bbb-3(b)(1), unless the authorization is terminated or revoked.     Resp Syncytial Virus by PCR NEGATIVE NEGATIVE Final    Comment: (NOTE) Fact Sheet for Patients: EntrepreneurPulse.com.au  Fact Sheet for Healthcare Providers: IncredibleEmployment.be  This test is not yet approved or cleared by the [place] FDA and has been authorized for detection and/or diagnosis of SARS-CoV-2 by FDA under an Emergency Use Authorization (EUA). This EUA will remain in effect (meaning this test can be used) for the duration of the COVID-19 declaration under Section 564(b)(1) of the Act, 21 U.S.C. section 360bbb-3(b)(1), unless the authorization is terminated or revoked.  Performed at [facility], [address] [place], [place] [address]   Blood culture (routine single)     Status: None (Preliminary result)   Collection Time: 10/26/22  9:03 AM   Specimen: Right Antecubital; Blood  Result Value Ref Range Status   Specimen Description   Final    RIGHT ANTECUBITAL BOTTLES DRAWN AEROBIC AND ANAEROBIC   Special Requests Blood Culture adequate volume  Final   Culture   Final    NO GROWTH 2 DAYS Performed at [facility], [address] [place], [place] [address]    Report Status PENDING  Incomplete     Radiology Studies: MR BRAIN W WO CONTRAST  Result Date: 10/28/2022 CLINICAL DATA:  History of colon and prostate cancer, metastatic workup. EXAM: MRI HEAD WITHOUT AND WITH CONTRAST TECHNIQUE: Multiplanar, multiecho pulse sequences of the brain and surrounding structures were obtained without and with intravenous contrast. CONTRAST:  69mL GADAVIST GADOBUTROL 1 MMOL/ML IV SOLN  COMPARISON:  10/12/2021 FINDINGS: Brain: No enhancement or swelling to suggest metastatic disease. No incidental infarct, hemorrhage, hydrocephalus, or collection. Mild-to-moderate chronic small vessel ischemia in the cerebral white matter and pons. Mild for age cerebral volume loss Vascular: Normal flow voids and vascular enhancements Skull and upper cervical spine: Normal marrow signal Sinuses/Orbits: Bilateral cataract resection. No evidence of mass or inflammation IMPRESSION: Negative for metastatic disease.  No acute or reversible finding. Electronically Signed   By: Jorje Guild M.D.   On: 10/28/2022 09:06    Scheduled Meds:  amLODipine  5 mg Oral Daily   atorvastatin  40 mg Oral QPM   dextromethorphan  30 mg Oral BID   fluticasone furoate-vilanterol  1 puff Inhalation Daily   guaiFENesin  600 mg Oral BID   insulin aspart  0-9 Units Subcutaneous TID WC   insulin aspart  8 Units Subcutaneous TID WC   insulin glargine-yfgn  24 Units Subcutaneous Daily   ipratropium-albuterol  3 mL Nebulization TID   propranolol ER  80 mg Oral QHS   Continuous Infusions:  lactated ringers 100 mL/hr at 10/28/22 0619     LOS: 1 day   Time spent: 35 mins  Hila Bolding Wynetta Emery, MD How to contact the [facility] Attending or Consulting provider [place] or covering provider during after hours [place], for this patient?  Check the care team in [facility] and look for a) attending/consulting [facility] provider listed and b) the [facility] team listed Log into [email or web address] and use [place]'s universal password to access. If you do not have the password, please contact the hospital operator. Locate the [facility] provider you are looking for under [facility] and page to a number that you can be directly reached. If you still have difficulty reaching the provider, please page the [facility] (Director on Call) for the Hospitalists listed on amion for assistance.  10/28/2022, 12:08 PM

## 2022-10-28 NOTE — Progress Notes (Signed)
Patients oxygen reading was 97 on 2.5 liters.patient appears confused but when patient is asked orientation questions patient answered here this nurse asked where is here patient stated hospital then patient stated he is not stupid, this nurse told the patient that we have to ask these questions. Patient wanted oxygen taken off this nurse stated to patient that his oxygen saturation is 97 and it dropped to 85 with no oxygen, stated to the patient that he needed to keep oxygen on. Bed alarm placed.

## 2022-10-28 NOTE — Progress Notes (Signed)
Nurse tech went into patients room to obtain vital signs and CBG. Oxygen saturation reading was 85 on room air, this nurse placed oxygen at 2.5 liters nasal canula,oxygen came up to 89, educated patient to keep oxygen on. Will obtain another reading.

## 2022-10-28 NOTE — TOC Progression Note (Signed)
  Transition of Care [facility]) Screening Note   Patient Details  Name: MATT DELPIZZO Date of Birth: Sep 04, 1935   Transition of Care [facility]) CM/SW Contact:    Shade Flood, LCSW Phone Number: 10/28/2022, 11:50 AM    Transition of Care Department [facility]) has reviewed patient and no TOC needs have been identified at this time. We will continue to monitor patient advancement through interdisciplinary progression rounds. If new patient transition needs arise, please place a TOC consult.

## 2022-10-28 NOTE — Consult Note (Signed)
[facility] Consultation Oncology  Name: [name]      MRN: [id number]    Location: Z767/H419-37  Date: 10/28/2022 Time:3:55 PM   REFERRING PHYSICIAN: Dr. Wynetta Emery  REASON FOR CONSULT: Left lung nodule, mediastinal and supraclavicular lymphadenopathy   DIAGNOSIS: Highly likely stage III lung cancer  HISTORY OF PRESENT ILLNESS: [name] is a 88 year old very pleasant white male seen in consultation today at the request of Dr. Wynetta Emery.  He reported 39-month history of cough mostly nonproductive.  Denied any weight loss.  For the last 3 weeks he was having hemoptysis, small quantity.  Otherwise he is able to function well at home.  He lives at home with his wife.  He probably smoked 1 to 2 cigarettes/day for 1 year when he was working at [employer] in [place].  He worked in the Company secretary business for 40 years and also worked at [employer] for 30 years as a second job.  He came to the ER with hemoptysis on 10/26/2022.  CT CAP showed 1.4 cm left lower lobe lung nodule and a tiny 5 mm nodule in the right lung apex.  It also showed subcarinal lymph node 1.9 cm, left paratracheal lymph node 1.5 cm, left hilar mass versus adenopathy measuring 2.7 x 5.8 x 5.5 cm and left supraclavicular adenopathy 1.6 cm.  No signs of metastatic disease in the abdomen and pelvis.  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, anomalous circumflex - no PCIs   Essential hypertension, benign    GERD (gastroesophageal reflux disease)    Mixed hyperlipidemia    Obstructive sleep apnea    does not use   Prostate cancer (HCC)    PVD (peripheral vascular disease) (HCC)    Type 2  diabetes mellitus (HCC)     ALLERGIES: Allergies  Allergen Reactions   Bee Venom Anaphylaxis   Adhesive [Tape] Hives   Ciprofloxacin     Pt does not remember reaction    Codeine     Pt does not remember reaction    Iodine     Pt does not remember reaction     Povidone-Iodine     Pt does not remember reaction    Simvastatin     Pt does not remember reaction    Latex Rash      MEDICATIONS: I have reviewed the patient's current medications.     PAST SURGICAL HISTORY Past Surgical History:  Procedure Laterality Date   BACK SURGERY     BALLOON DILATION N/A 08/27/2013   Procedure: BALLOON DILATION;  Surgeon: Rogene Houston, MD;  [facility];  Service: Endoscopy;  Laterality: N/A;   COLON SURGERY     COLONOSCOPY N/A 12/17/2013   Procedure: COLONOSCOPY;  Surgeon: Rogene Houston, MD;  [facility];  Service: Endoscopy;  Laterality: N/A;  940   COLONOSCOPY N/A 02/28/2014   Procedure: COLONOSCOPY;  Surgeon: Rogene Houston, MD;  [facility];  Service: Endoscopy;  Laterality: N/A;  730   COLONOSCOPY N/A 08/04/2015   Procedure: COLONOSCOPY;  Surgeon: Rogene Houston, MD;  [facility];  Service: Endoscopy;  Laterality: N/A;  200 - moved  to 11/11 @ 2:10 - Ann to notify pt   COLONOSCOPY WITH ESOPHAGOGASTRODUODENOSCOPY (EGD) N/A 08/27/2013   Procedure: COLONOSCOPY WITH ESOPHAGOGASTRODUODENOSCOPY (EGD);  Surgeon: Rogene Houston, MD;  [facility];  Service: Endoscopy;  Laterality: N/A;  730   COLONOSCOPY WITH PROPOFOL N/A 01/02/2022   Procedure: COLONOSCOPY WITH PROPOFOL;  Surgeon: Rogene Houston, MD;  [facility];  Service: Endoscopy;  Laterality: N/A;  1205   CYSTOSCOPY N/A 06/09/2021   Procedure: CYSTOSCOPY;  Surgeon: Lucas Mallow, MD;  Location: [facility];  Service: Urology;  Laterality: N/A;   IR REMOVAL TUN ACCESS W/ PORT W/O FL MOD SED  07/17/2018   KNEE ARTHROSCOPY Left    KNEE SURGERY Right    total knee    MALONEY DILATION N/A 08/27/2013   Procedure: MALONEY DILATION;  Surgeon: Rogene Houston, MD;  [facility];  Service: Endoscopy;  Laterality: N/A;   POLYPECTOMY  01/02/2022   Procedure: POLYPECTOMY;  Surgeon: Rogene Houston, MD;  [facility];  Service: Endoscopy;;  cecal;ascending;proximal, sigmoid;rectal;   PORTACATH PLACEMENT Right 09/23/2012   PROSTATECTOMY     REMOVAL OF PENILE PROSTHESIS N/A 06/09/2021   Procedure: REMOVAL OF ARTIFICIAL URINARY SPHINCER; REMOVAL OF INFLATABLE PENILE PROSTHESIS;  Surgeon: Lucas Mallow, MD;  Location: [facility];  Service: Urology;  Laterality: N/A;   removal of port Right    SAVORY DILATION N/A 08/27/2013   Procedure: SAVORY DILATION;  Surgeon: Rogene Houston, MD;  [facility];  Service: Endoscopy;  Laterality: N/A;   SHOULDER SURGERY     TONSILLECTOMY     URINARY SPHINCTER IMPLANT N/A 01/23/2017   artificial urinary sphincter implant by Dr. Odis Luster   VASECTOMY      FAMILY HISTORY: Family History  Problem Relation Age of Onset   Colon cancer Brother    Cancer Brother    Diabetes Father    Cancer Brother     SOCIAL HISTORY:  reports that he has never smoked. He has never used smokeless tobacco. He reports that he does not drink alcohol and does not use drugs.  PERFORMANCE STATUS: The patient's performance status is 1 - Symptomatic but completely ambulatory  PHYSICAL EXAM: Most Recent Vital Signs: Blood pressure 121/69, pulse (!) 56, temperature 98.3 F (36.8 C), temperature source Oral, resp. rate 18, height 5\' 11"  (1.803 m), weight 217 lb 13 oz (98.8 kg), SpO2 92 %. BP 121/69 (BP Location: Left Arm)   Pulse (!) 56   Temp 98.3 F (36.8 C) (Oral)   Resp 18   Ht 5\' 11"  (1.803 m)   Wt 217 lb 13 oz (98.8 kg)   SpO2 92%   BMI 30.38 kg/m  General appearance: alert, cooperative, and appears stated age Lungs: clear to auscultation bilaterally Heart: regular rate and rhythm Extremities:  No edema  or cyanosis. Lymph nodes: Supraclavicular adenopathy: Palpable about the medial head of the left clavicle  LABORATORY DATA:  Results for orders placed or performed during the hospital encounter of 10/26/22 (from the past 48 hour(s))  Glucose, capillary     Status: Abnormal   Collection Time: 10/26/22  5:11 PM  Result Value Ref Range   Glucose-Capillary 150 (H) 70 - 99 mg/dL    Comment: Glucose reference range applies only to samples taken after fasting for at least 8 hours.  Glucose, capillary     Status: Abnormal   Collection Time: 10/26/22  9:59 PM  Result Value Ref Range  Glucose-Capillary 212 (H) 70 - 99 mg/dL    Comment: Glucose reference range applies only to samples taken after fasting for at least 8 hours.  Glucose, capillary     Status: Abnormal   Collection Time: 10/27/22  3:18 AM  Result Value Ref Range   Glucose-Capillary 108 (H) 70 - 99 mg/dL    Comment: Glucose reference range applies only to samples taken after fasting for at least 8 hours.  Comprehensive metabolic panel     Status: Abnormal   Collection Time: 10/27/22  5:32 AM  Result Value Ref Range   Sodium 138 135 - 145 mmol/L   Potassium 4.3 3.5 - 5.1 mmol/L    Comment: DELTA CHECK NOTED   Chloride 104 98 - 111 mmol/L   CO2 26 22 - 32 mmol/L   Glucose, Bld 178 (H) 70 - 99 mg/dL    Comment: Glucose reference range applies only to samples taken after fasting for at least 8 hours.   BUN 22 8 - 23 mg/dL   Creatinine, Ser 1.45 (H) 0.61 - 1.24 mg/dL   Calcium 8.5 (L) 8.9 - 10.3 mg/dL   Total Protein 5.9 (L) 6.5 - 8.1 g/dL   Albumin 3.0 (L) 3.5 - 5.0 g/dL   AST 16 15 - 41 U/L   ALT 12 0 - 44 U/L   Alkaline Phosphatase 88 38 - 126 U/L   Total Bilirubin 0.8 0.3 - 1.2 mg/dL   GFR, Estimated 47 (L) >60 mL/min    Comment: (NOTE) Calculated using the CKD-EPI Creatinine Equation (2021)    Anion gap 8 5 - 15    Comment: Performed at [facility], [address] [place], [place] [address]  Magnesium     Status:  Abnormal   Collection Time: 10/27/22  5:32 AM  Result Value Ref Range   Magnesium 1.4 (L) 1.7 - 2.4 mg/dL    Comment: Performed at [facility], [address] [place], [place] [address]  CBC     Status: Abnormal   Collection Time: 10/27/22  5:32 AM  Result Value Ref Range   WBC 5.8 4.0 - 10.5 K/uL   RBC 3.34 (L) 4.22 - 5.81 MIL/uL   Hemoglobin 10.0 (L) 13.0 - 17.0 g/dL   HCT 31.2 (L) 39.0 - 52.0 %   MCV 93.4 80.0 - 100.0 fL   MCH 29.9 26.0 - 34.0 pg   MCHC 32.1 30.0 - 36.0 g/dL   RDW 16.1 (H) 11.5 - 15.5 %   Platelets 144 (L) 150 - 400 K/uL   nRBC 0.0 0.0 - 0.2 %    Comment: Performed at [facility], [address], [place],  [address]  Hemoglobin A1c     Status: Abnormal   Collection Time: 10/27/22  5:32 AM  Result Value Ref Range   Hgb A1c MFr Bld 7.2 (H) 4.8 - 5.6 %    Comment: (NOTE) Pre diabetes:          5.7%-6.4%  Diabetes:              >6.4%  Glycemic control for   <7.0% adults with diabetes    Mean Plasma Glucose 159.94 mg/dL    Comment: Performed at [facility], [place] [address], [place], [place] [address]  Glucose, capillary     Status: Abnormal   Collection Time: 10/27/22  7:37 AM  Result Value Ref Range   Glucose-Capillary 143 (H) 70 - 99 mg/dL    Comment: Glucose reference range applies only  to samples taken after fasting for at least 8 hours.  Glucose, capillary     Status: Abnormal   Collection Time: 10/27/22 11:38 AM  Result Value Ref Range   Glucose-Capillary 249 (H) 70 - 99 mg/dL    Comment: Glucose reference range applies only to samples taken after fasting for at least 8 hours.  Urinalysis, Routine w reflex microscopic -Urine, Clean Catch     Status: Abnormal   Collection Time: 10/27/22 12:40 PM  Result Value Ref Range   Color, Urine YELLOW YELLOW   APPearance CLEAR CLEAR   Specific Gravity, Urine 1.017 1.005 - 1.030   pH 5.0 5.0 - 8.0   Glucose, UA 150 (A) NEGATIVE mg/dL   Hgb urine dipstick NEGATIVE NEGATIVE   Bilirubin Urine  NEGATIVE NEGATIVE   Ketones, ur NEGATIVE NEGATIVE mg/dL   Protein, ur NEGATIVE NEGATIVE mg/dL   Nitrite NEGATIVE NEGATIVE   Leukocytes,Ua NEGATIVE NEGATIVE    Comment: Performed at [facility], [address], [place], [place] [address]  Glucose, capillary     Status: Abnormal   Collection Time: 10/27/22  2:52 PM  Result Value Ref Range   Glucose-Capillary 51 (L) 70 - 99 mg/dL    Comment: Glucose reference range applies only to samples taken after fasting for at least 8 hours.  Glucose, capillary     Status: None   Collection Time: 10/27/22  3:16 PM  Result Value Ref Range   Glucose-Capillary 82 70 - 99 mg/dL    Comment: Glucose reference range applies only to samples taken after fasting for at least 8 hours.  Glucose, capillary     Status: None   Collection Time: 10/27/22  5:12 PM  Result Value Ref Range   Glucose-Capillary 87 70 - 99 mg/dL    Comment: Glucose reference range applies only to samples taken after fasting for at least 8 hours.  Glucose, capillary     Status: None   Collection Time: 10/27/22  6:33 PM  Result Value Ref Range   Glucose-Capillary 76 70 - 99 mg/dL    Comment: Glucose reference range applies only to samples taken after fasting for at least 8 hours.   Comment 1 Notify RN   Glucose, capillary     Status: Abnormal   Collection Time: 10/27/22 10:02 PM  Result Value Ref Range   Glucose-Capillary 140 (H) 70 - 99 mg/dL    Comment: Glucose reference range applies only to samples taken after fasting for at least 8 hours.  Glucose, capillary     Status: Abnormal   Collection Time: 10/28/22  3:20 AM  Result Value Ref Range   Glucose-Capillary 284 (H) 70 - 99 mg/dL    Comment: Glucose reference range applies only to samples taken after fasting for at least 8 hours.  CBC     Status: Abnormal   Collection Time: 10/28/22  5:02 AM  Result Value Ref Range   WBC 6.3 4.0 - 10.5 K/uL   RBC 3.16 (L) 4.22 - 5.81 MIL/uL   Hemoglobin 9.5 (L) 13.0 - 17.0 g/dL   HCT 29.7  (L) 39.0 - 52.0 %   MCV 94.0 80.0 - 100.0 fL   MCH 30.1 26.0 - 34.0 pg   MCHC 32.0 30.0 - 36.0 g/dL   RDW 16.0 (H) 11.5 - 15.5 %   Platelets 138 (L) 150 - 400 K/uL   nRBC 0.0 0.0 - 0.2 %    Comment: Performed at [facility], [address], [place], [place] [address]  Basic metabolic panel     Status: Abnormal   Collection Time: 10/28/22  5:02 AM  Result Value Ref Range   Sodium 134 (L) 135 - 145 mmol/L   Potassium 4.3 3.5 - 5.1 mmol/L   Chloride 101 98 - 111 mmol/L   CO2 25 22 - 32 mmol/L   Glucose, Bld 241 (H) 70 - 99 mg/dL    Comment: Glucose reference range applies only to samples taken after fasting for at least 8 hours.   BUN 18 8 - 23 mg/dL   Creatinine, Ser 1.36 (H) 0.61 - 1.24 mg/dL   Calcium 8.3 (L) 8.9 - 10.3 mg/dL   GFR, Estimated 51 (L) >60 mL/min    Comment: (NOTE) Calculated using the CKD-EPI Creatinine Equation (2021)    Anion gap 8 5 - 15    Comment: Performed at [facility], [address] [place], [place] [address]  Magnesium     Status: None   Collection Time: 10/28/22  5:02 AM  Result Value Ref Range   Magnesium 1.9 1.7 - 2.4 mg/dL    Comment: Performed at [facility], [address], [place], [place] [address]  Glucose, capillary     Status: Abnormal   Collection Time: 10/28/22  7:59 AM  Result Value Ref Range   Glucose-Capillary 169 (H) 70 - 99 mg/dL    Comment: Glucose reference range applies only to samples taken after fasting for at least 8 hours.  Glucose, capillary     Status: Abnormal   Collection Time: 10/28/22 11:16 AM  Result Value Ref Range   Glucose-Capillary 196 (H) 70 - 99 mg/dL    Comment: Glucose reference range applies only to samples taken after fasting for at least 8 hours.      RADIOGRAPHY: MR BRAIN W WO CONTRAST  Result Date: 10/28/2022 CLINICAL DATA:  History of colon and prostate cancer, metastatic workup. EXAM: MRI HEAD WITHOUT AND WITH CONTRAST TECHNIQUE: Multiplanar, multiecho pulse sequences of the brain and surrounding  structures were obtained without and with intravenous contrast. CONTRAST:  44mL GADAVIST GADOBUTROL 1 MMOL/ML IV SOLN COMPARISON:  10/12/2021 FINDINGS: Brain: No enhancement or swelling to suggest metastatic disease. No incidental infarct, hemorrhage, hydrocephalus, or collection. Mild-to-moderate chronic small vessel ischemia in the cerebral white matter and pons. Mild for age cerebral volume loss Vascular: Normal flow voids and vascular enhancements Skull and upper cervical spine: Normal marrow signal Sinuses/Orbits: Bilateral cataract resection. No evidence of mass or inflammation IMPRESSION: Negative for metastatic disease.  No acute or reversible finding. Electronically Signed   By: Jorje Guild M.D.   On: 10/28/2022 09:06         ASSESSMENT and PLAN:  1.  Highly likely stage III left lung cancer: - Presentation with cough 3 months, mild hemoptysis 3 weeks.  No weight loss. - Lives at home with his wife.  Independent of ADLs and IADLs.  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  Smoked for 1 year, 1 to 2 cigarettes/day.  3 sisters had breast cancer.  2 brothers had lung cancer. - CT CAP (10/26/2022): 1.4 cm left lower lobe lung nodule.  5 mm nodule in the right apex.  Mass/conglomerate adenopathy in the left hilum 2.7 x 5.8 x 5.8 cm.  Subcarinal lymph node 1.9 cm.  Left supraclavicular lymph node 1.6 cm.  Left paratracheal lymph node 1.5 cm.  No findings suggestive of metastatic disease in the abdomen and pelvis. - Brain MRI (10/28/2022): Negative  for metastatic disease. - I have reviewed the images of the CT scan with the patient. - Recommend biopsy of the left supraclavicular mass. - Recommend port placement. - Will arrange for PET scan as outpatient. - Will see him in the office to discuss treatment plan. - I will discuss with Dr. Wynetta Emery and patient's daughter Storm Frisk.  2.  Prostate cancer: - Robotic assisted laparoscopic  prostatectomy on 02/24/2008 at [facility] with extranodal extension, perineural invasion, positive margin.  He received adjuvant XRT/ADT until 08/26/2008. - Last PSA in 2018 undetectable.  3.  Mantle cell lymphoma of the colon: - Colon biopsy (08/27/2013) by Dr. Laural Golden consistent with mantle cell lymphoma. - Bendamustine/rituximab for 6 cycles from 09/14/2013 through 02/07/2014 - Maintenance rituximab from 05/10/2014 through 04/01/2016  All questions were answered. The patient knows to call the clinic with any problems, questions or concerns. We can certainly see the patient much sooner if necessary.    Derek Jack

## 2022-10-28 NOTE — Consult Note (Signed)
NAME:  [name], MRN:  [id number], DOB:  1934/12/04, LOS: 1 ADMISSION DATE:  10/26/2022, CONSULTATION DATE:  10/28/2022  REFERRING MD:  Wynetta Emery, TR[facility], CHIEF COMPLAINT:  hemoptysis   History of Present Illness:  88 year old ex-smoker presents with hemoptysis.  He complains of cough and chest congestion for 2 weeks He also reported rectal bleeding.  He reports blood-tinged sputum for 2 weeks which is worse over the last 24 hours CT angiogram chest/abdomen/pelvis showed spiculated nodule in the right lower lobe superior segment x 1.4 cm, there was a left hilar mass measuring 2.7 x 5.8 x 5.5 cm, there was new left paratracheal and subcarinal lymphadenopathy-compared to 04/2021-as also has a left supraclavicular lymph node measuring 1.6 cm  Pertinent  Medical History  Diabetes type 2 CKD-stage IIIb Prostate cancer status post prostatectomy CAD Peripheral arterial disease OSA Ulcerative colitis in remission Colon cancer 08/2013   Significant Hospital Events: Including procedures, antibiotic start and stop dates in addition to other pertinent events     Interim History / Subjective:  Afebrile No dyspnea or chest pain  Objective   Blood pressure 121/69, pulse (!) 56, temperature 98.3 F (36.8 C), temperature source Oral, resp. rate 18, height 5\' 11"  (1.803 m), weight 98.8 kg, SpO2 92 %.        Intake/Output Summary (Last 24 hours) at 10/28/2022 1356 Last data filed at 10/28/2022 4098 Gross per 24 hour  Intake 2526.84 ml  Output --  Net 2526.84 ml   Filed Weights   10/26/22 0848 10/26/22 1604  Weight: 100.7 kg 98.8 kg    Examination: General: Elderly man, sitting up side of bed, no distress able to speak in full sentences HENT: No pallor, no icterus, no JVD, no lymphadenopathy palpated, fullness both supraclavicular area Lungs: Clear breath sounds bilateral, no rhonchi, no accessory muscle use Cardiovascular: S1-S2 regular, no murmur Abdomen: Soft, nontender, no  hepatomegaly Extremities: No deformity, no edema Neuro: Alert, interactive, nonfocal  Labs show mild hyponatremia, stable creatinine at 1.3, improved hypomagnesemia, no leukocytosis, stable anemia and thrombocytopenia   Resolved Hospital Problem list     Assessment & Plan:  Hemoptysis with left hilar mass and paratracheal/subcarinal lymphadenopathy with supraclavicular lymphadenopathy is very suspicious for metastatic lung cancer. The right lower lobe nodule could be a second primary or metastatic lesion. No signs of metastasis in the abdomen   -Simplest target for biopsy here would be to proceed with left supraclavicular lymph node biopsy using ultrasound guidance. -For some reason if that is negative then we can pursue bronchoscopy with EBUS and target left paratracheal/ subcarinal lymphadenopathy for staging as also left hilar mass -PET scan is planned by oncology -Cough suppression with OTC Delsym and codeine containing cough syrup if needed  Best Practice (right click and "Reselect all SmartList Selections" daily)   Per [facility] Code Status:  full code  Labs   CBC: Recent Labs  Lab 10/26/22 0902 10/27/22 0532 10/28/22 0502  WBC 6.8 5.8 6.3  NEUTROABS 5.1  --   --   HGB 10.6* 10.0* 9.5*  HCT 32.6* 31.2* 29.7*  MCV 91.1 93.4 94.0  PLT 143* 144* 138*    Basic Metabolic Panel: Recent Labs  Lab 10/26/22 0902 10/27/22 0532 10/28/22 0502  NA 138 138 134*  K 3.2* 4.3 4.3  CL 103 104 101  CO2 22 26 25   GLUCOSE 149* 178* 241*  BUN 29* 22 18  CREATININE 1.59* 1.45* 1.36*  CALCIUM 8.6* 8.5* 8.3*  MG  --  1.4*  1.9   GFR: Estimated Creatinine Clearance: 47.6 mL/min (A) (by C-G formula based on SCr of 1.36 mg/dL (H)). Recent Labs  Lab 10/26/22 0902 10/27/22 0532 10/28/22 0502  WBC 6.8 5.8 6.3  LATICACIDVEN 1.2  --   --     Liver Function Tests: Recent Labs  Lab 10/26/22 0902 10/27/22 0532  AST 15 16  ALT 13 12  ALKPHOS 102 88  BILITOT 1.2 0.8  PROT 6.8  5.9*  ALBUMIN 3.5 3.0*   No results for input(s): "LIPASE", "AMYLASE" in the last 168 hours. No results for input(s): "AMMONIA" in the last 168 hours.  ABG    Component Value Date/Time   PHART 7.372 09/03/2021 0845   PCO2ART 49.2 (H) 09/03/2021 0845   PO2ART 66.5 (L) 09/03/2021 0845   HCO3 26.4 09/03/2021 0845   O2SAT 92.1 09/03/2021 0845     Coagulation Profile: Recent Labs  Lab 10/26/22 0902  INR 1.1    Cardiac Enzymes: No results for input(s): "CKTOTAL", "CKMB", "CKMBINDEX", "TROPONINI" in the last 168 hours.  HbA1C: Hgb A1c MFr Bld  Date/Time Value Ref Range Status  10/27/2022 05:32 AM 7.2 (H) 4.8 - 5.6 % Final    Comment:    (NOTE) Pre diabetes:          5.7%-6.4%  Diabetes:              >6.4%  Glycemic control for   <7.0% adults with diabetes   10/12/2021 04:36 AM 8.8 (H) 4.8 - 5.6 % Final    Comment:    (NOTE)         Prediabetes: 5.7 - 6.4         Diabetes: >6.4         Glycemic control for adults with diabetes: <7.0     CBG: Recent Labs  Lab 10/27/22 1833 10/27/22 2202 10/28/22 0320 10/28/22 0759 10/28/22 1116  GLUCAP 76 140* 284* 169* 196*    Review of Systems:   Cough Blood-tinged sputum Poor stream of urine  Constitutional: negative for anorexia, fevers and sweats  Eyes: negative for irritation, redness and visual disturbance  Ears, nose, mouth, throat, and face: negative for earaches, epistaxis, nasal congestion and sore throat  Respiratory: negative for , dyspnea on exertion, sputum and wheezing  Cardiovascular: negative for chest pain, dyspnea, lower extremity edema, orthopnea, palpitations and syncope  Gastrointestinal: negative for abdominal pain, constipation, diarrhea,nausea and vomiting  Genitourinary:negative for dysuria, frequency and hematuria  Hematologic/lymphatic: negative for bleeding, easy bruising and lymphadenopathy  Musculoskeletal:negative for arthralgias, muscle weakness and stiff joints  Neurological: negative  for coordination problems, gait problems, headaches and weakness  Endocrine: negative for diabetic symptoms including polydipsia, polyuria and weight loss   Past Medical History:  He,  has a past medical history of B12 deficiency (02/07/2014), Carotid artery plaque (10/17/2021), Colon cancer ([place]), Coronary atherosclerosis of native coronary artery, Essential hypertension, benign, GERD (gastroesophageal reflux disease), Mixed hyperlipidemia, Obstructive sleep apnea, Prostate cancer ([place]), PVD (peripheral vascular disease) ([place]), and Type 2 diabetes mellitus ([place]).   Surgical History:   Past Surgical History:  Procedure Laterality Date   BACK SURGERY     BALLOON DILATION N/A 08/27/2013   Procedure: BALLOON DILATION;  Surgeon: Rogene Houston, MD;  [facility];  Service: Endoscopy;  Laterality: N/A;   COLON SURGERY     COLONOSCOPY N/A 12/17/2013   Procedure: COLONOSCOPY;  Surgeon: Rogene Houston, MD;  [facility];  Service: Endoscopy;  Laterality: N/A;  940  COLONOSCOPY N/A 02/28/2014   Procedure: COLONOSCOPY;  Surgeon: Rogene Houston, MD;  [facility];  Service: Endoscopy;  Laterality: N/A;  730   COLONOSCOPY N/A 08/04/2015   Procedure: COLONOSCOPY;  Surgeon: Rogene Houston, MD;  [facility];  Service: Endoscopy;  Laterality: N/A;  200 - moved to 11/11 @ 2:10 - Ann to notify pt   COLONOSCOPY WITH ESOPHAGOGASTRODUODENOSCOPY (EGD) N/A 08/27/2013   Procedure: COLONOSCOPY WITH ESOPHAGOGASTRODUODENOSCOPY (EGD);  Surgeon: Rogene Houston, MD;  [facility];  Service: Endoscopy;  Laterality: N/A;  730   COLONOSCOPY WITH PROPOFOL N/A 01/02/2022   Procedure: COLONOSCOPY WITH PROPOFOL;  Surgeon: Rogene Houston, MD;  [facility];  Service: Endoscopy;  Laterality: N/A;  1205   CYSTOSCOPY N/A 06/09/2021   Procedure: CYSTOSCOPY;  Surgeon: Lucas Mallow, MD;  Location: [facility];  Service: Urology;  Laterality: N/A;   IR REMOVAL  TUN ACCESS W/ PORT W/O FL MOD SED  07/17/2018   KNEE ARTHROSCOPY Left    KNEE SURGERY Right    total knee   MALONEY DILATION N/A 08/27/2013   Procedure: MALONEY DILATION;  Surgeon: Rogene Houston, MD;  [facility];  Service: Endoscopy;  Laterality: N/A;   POLYPECTOMY  01/02/2022   Procedure: POLYPECTOMY;  Surgeon: Rogene Houston, MD;  [facility];  Service: Endoscopy;;  cecal;ascending;proximal, sigmoid;rectal;   PORTACATH PLACEMENT Right 09/23/2012   PROSTATECTOMY     REMOVAL OF PENILE PROSTHESIS N/A 06/09/2021   Procedure: REMOVAL OF ARTIFICIAL URINARY SPHINCER; REMOVAL OF INFLATABLE PENILE PROSTHESIS;  Surgeon: Lucas Mallow, MD;  Location: [facility];  Service: Urology;  Laterality: N/A;   removal of port Right    SAVORY DILATION N/A 08/27/2013   Procedure: SAVORY DILATION;  Surgeon: Rogene Houston, MD;  [facility];  Service: Endoscopy;  Laterality: N/A;   SHOULDER SURGERY     TONSILLECTOMY     URINARY SPHINCTER IMPLANT N/A 01/23/2017   artificial urinary sphincter implant by Dr. Odis Luster   VASECTOMY       Social History:   reports that he has never smoked. He has never used smokeless tobacco. He reports that he does not drink alcohol and does not use drugs.   Family History:  His family history includes Cancer in his brother and brother; Colon cancer in his brother; Diabetes in his father.   Allergies Allergies  Allergen Reactions   Bee Venom Anaphylaxis   Adhesive [Tape] Hives   Ciprofloxacin     Pt does not remember reaction    Codeine     Pt does not remember reaction    Iodine     Pt does not remember reaction     Povidone-Iodine     Pt does not remember reaction    Simvastatin     Pt does not remember reaction    Latex Rash     Home Medications  Prior to Admission medications   Medication Sig Start Date End Date Taking? Authorizing Provider  acetaminophen (TYLENOL) 325 MG tablet Take 650 mg by mouth every 6 (six)  hours as needed for mild pain or moderate pain.   Yes [provider]  albuterol (PROAIR HFA) 108 (90 Base) MCG/ACT inhaler Inhale 2 puffs into the lungs at bedtime as needed for wheezing or shortness of breath. 05/12/20  Yes Tanda Rockers, MD  amLODipine (NORVASC) 10 MG tablet Take 1 tablet (10 mg total) by mouth  daily. 01/11/22  Yes Richardson Dopp T, PA-C  aspirin EC 81 MG tablet Take 1 tablet (81 mg total) by mouth daily. Swallow whole. 01/05/22  Yes Rehman, Mechele Dawley, MD  atorvastatin (LIPITOR) 40 MG tablet Take 40 mg by mouth daily. 10/05/21  Yes [provider]  budesonide-formoterol (SYMBICORT) 160-4.5 MCG/ACT inhaler Inhale 2 puffs into the lungs 2 (two) times daily. 05/12/20  Yes Tanda Rockers, MD  fluticasone (FLONASE) 50 MCG/ACT nasal spray Place 1 spray into both nostrils as needed for allergies or rhinitis.   Yes [provider]  insulin aspart (NOVOLOG) 100 UNIT/ML injection Inject 8-10 Units into the skin 3 (three) times daily before meals. Per sliding scale   Yes [provider]  LORazepam (ATIVAN) 0.5 MG tablet Take 0.5 mg by mouth at bedtime as needed for sleep.   Yes [provider]  meclizine (ANTIVERT) 25 MG tablet Take 1 tablet (25 mg total) by mouth 3 (three) times daily as needed for dizziness. 10/15/21  Yes Kathie Dike, MD  metFORMIN (GLUCOPHAGE-XR) 750 MG 24 hr tablet Take 750 mg by mouth daily. 11/06/21  Yes [provider]  propranolol ER (INDERAL LA) 80 MG 24 hr capsule Take 1 capsule (80 mg total) by mouth at bedtime. 10/15/21  Yes Kathie Dike, MD  testosterone cypionate (DEPOTESTOSTERONE CYPIONATE) 200 MG/ML injection Inject 200 mg into the muscle every 14 (fourteen) days.  04/29/16  Yes [provider]  TOUJEO SOLOSTAR 300 UNIT/ML Solostar Pen Inject 30 Units into the skin 2 (two) times daily. Patient taking differently: Inject 70 Units into the skin daily. 10/15/21  Yes Kathie Dike, MD  blood glucose  meter kit and supplies KIT Dispense based on patient and insurance preference. Use at least three times a day to check BS levels and fluctuation. 09/04/21   Barton Dubois, MD      Kara Mead MD. FCCP. [facility] Pager : [phone]  If no response to pager , please call [phone] until 7 pm After 7:00 pm call Elink  [phone]   10/28/2022

## 2022-10-28 NOTE — Progress Notes (Signed)
PET scan order placed per Dr. Delton Coombes.

## 2022-10-28 NOTE — Progress Notes (Signed)
I arrived on my shift and started making rounds to see the patients. This patient has an order for LR@100 . He was not hooked to his IV fluids and I was attempting to place him back on his fluids when he told me he has had enough fluids and he did not want anymore. I explained to him that this was a doctor order. He stated he was not going to take any more fluids. MD notified.

## 2022-10-29 ENCOUNTER — Other Ambulatory Visit: Payer: Self-pay | Admitting: Hematology

## 2022-10-29 DIAGNOSIS — D62 Acute posthemorrhagic anemia: Secondary | ICD-10-CM | POA: Diagnosis not present

## 2022-10-29 DIAGNOSIS — R0602 Shortness of breath: Secondary | ICD-10-CM | POA: Diagnosis not present

## 2022-10-29 DIAGNOSIS — G4733 Obstructive sleep apnea (adult) (pediatric): Secondary | ICD-10-CM

## 2022-10-29 DIAGNOSIS — R042 Hemoptysis: Secondary | ICD-10-CM | POA: Diagnosis not present

## 2022-10-29 DIAGNOSIS — C3490 Malignant neoplasm of unspecified part of unspecified bronchus or lung: Secondary | ICD-10-CM

## 2022-10-29 DIAGNOSIS — R918 Other nonspecific abnormal finding of lung field: Secondary | ICD-10-CM | POA: Diagnosis not present

## 2022-10-29 LAB — CBC
HCT: 30.1 % — ABNORMAL LOW (ref 39.0–52.0)
Hemoglobin: 9.6 g/dL — ABNORMAL LOW (ref 13.0–17.0)
MCH: 29.4 pg (ref 26.0–34.0)
MCHC: 31.9 g/dL (ref 30.0–36.0)
MCV: 92 fL (ref 80.0–100.0)
Platelets: 147 10*3/uL — ABNORMAL LOW (ref 150–400)
RBC: 3.27 MIL/uL — ABNORMAL LOW (ref 4.22–5.81)
RDW: 15.7 % — ABNORMAL HIGH (ref 11.5–15.5)
WBC: 6.6 10*3/uL (ref 4.0–10.5)
nRBC: 0 % (ref 0.0–0.2)

## 2022-10-29 LAB — GLUCOSE, CAPILLARY
Glucose-Capillary: 169 mg/dL — ABNORMAL HIGH (ref 70–99)
Glucose-Capillary: 168 mg/dL — ABNORMAL HIGH (ref 70–99)
Glucose-Capillary: 207 mg/dL — ABNORMAL HIGH (ref 70–99)

## 2022-10-29 MED ORDER — TOUJEO SOLOSTAR 300 UNIT/ML ~~LOC~~ SOPN: 35.0000 [IU] | PEN_INJECTOR | Freq: Every day | SUBCUTANEOUS | Status: DC

## 2022-10-29 MED ORDER — DEXTROMETHORPHAN POLISTIREX ER 30 MG/5ML PO SUER: 30.0000 mg | Freq: Two times a day (BID) | ORAL | 1 refills | Status: DC | PRN

## 2022-10-29 NOTE — Discharge Summary (Signed)
Physician Discharge Summary  [name] NID:[id number] DOB: [birth year] DOA: 10/26/2022  PCP: Celene Squibb, MD  Admit date: 10/26/2022 Discharge date: 10/29/2022  Admitted From:  HOME  Disposition: HOME   Recommendations for Outpatient Follow-up:  Follow up with Dr. Worthy Keeler in about 7-10 days IR appt for outpatient port placement and lymph node biopsy arranged PET scan already scheduled for patient  Please obtain CBC in 1-2 weeks to follow hemoglobin Please follow up on the following pending results: biopsy results when available  Discharge Condition: STABLE   CODE STATUS: FULL DIET: Resume prior home diet Heart Healthy    Brief Hospitalization Summary: Please see all hospital notes, images, labs for full details of the hospitalization. ADMISSION HPI:   88 year old gentleman with history of mantle cell lymphoma colon cancer December 2014 treated with chemotherapy and has remained in remission, ulcerative colitis in remission off therapy, hypertension, hyperlipidemia, OSA, B12 deficiency, history of prostate cancer, coronary artery disease, GERD, hyperlipidemia, peripheral vascular disease, type 2 diabetes mellitus with chronic kidney disease presents to the emergency department with his wife complaining of cough and chest congestion for the past 2 weeks and now he is coughing up blood that is dark in color.  He reports that he coughed up blood most of the last 24 hours and increasing amounts of blood reported.  He also reports that he has noticed some rectal bleeding.  He reports that his cough seems to be worse every time he eats and he has not been able to eat in the last 24 hours.  He reports that every time he tries to eat something he starts coughing.  He also reports malaise and fatigue.  He reports having 2 black bowel movements yesterday.  He also reports that he saw blood in his urine.  He is not on any blood thinner medications.   His respiratory panel was negative for RSV  influenza and coronavirus.  The patient sent for CT scan of the chest abdomen and pelvis with findings of a spiculated nodule in the superior segment of the right lower lobe concerning for new bronchogenic carcinoma.  His Hg is 10.6.  After discussing with oncologist patient was given option of outpatient follow up.  However patient is feeling too weak to go home and afraid of worsening hemoptysis over next 24 hours.  He will be admitted for observation and serial Hg testing and symptom management of cough and evaluate for rectal bleeding.    HOSPITAL COURSE BY PROBLEM  Right lower lobe mass with mediastinal lymphadenopathy -Highly suspicious for bronchogenic carcinoma -Pt was seen by oncology and pulmonology inpatient -arrangements made for outpatient IR appt for subclavicular lymph node biopsy and port placement  -Dr. Delton Coombes arranged for PET scan and for follow up visit  -pulmonary to decide if bronch needed pending results of lymph node biopsy    Hemoptysis -seems to have stopped after discontinuing aspirin -follow Hg closely and has been stable at 9.5    Hematuria -checked urinalysis  -no RBC seen    Hypokalemia -oral replacement given -repleted    Hypomagnesemia  -repleted    Type 2 DM with renal complications - sensitive SSI and frequent CBG monitoring - carb modified diet CBG (last 3)  Recent Labs    10/29/22 0335 10/29/22 0736 10/29/22 1125  GLUCAP 169* 168* 207*   Stage 3b CKD -stable from recent testing -follow BMP -renally dose medications   CAD -no active chest pain -transfuse PRBC for Hg<8   Discharge  Diagnoses:  Principal Problem:   Cough with hemoptysis Active Problems:   Mixed hyperlipidemia   Hypertension   CAD (coronary artery disease)   GERD (gastroesophageal reflux disease)   SOB (shortness of breath)   OSA (obstructive sleep apnea)   Acute blood loss anemia   Right lower lobe lung mass   Peripheral polyneuropathy   Type 2 diabetes  mellitus with chronic kidney disease, with long-term current use of insulin (HCC)   Obesity (BMI 30.0-34.9)   Chronic renal failure, stage 3b (HCC)   Cervical radiculopathy   Aortic atherosclerosis (HCC)   Hypokalemia   Mass of lower lobe of left lung   Mediastinal adenopathy   Discharge Instructions:  Allergies as of 10/29/2022       Reactions   Bee Venom Anaphylaxis   Adhesive [tape] Hives   Ciprofloxacin    Pt does not remember reaction    Codeine    Pt does not remember reaction    Iodine    Pt does not remember reaction    Povidone-iodine    Pt does not remember reaction    Simvastatin    Pt does not remember reaction    Latex Rash        Medication List     STOP taking these medications    aspirin EC 81 MG tablet       TAKE these medications    acetaminophen 325 MG tablet Commonly known as: TYLENOL Take 650 mg by mouth every 6 (six) hours as needed for mild pain or moderate pain.   albuterol 108 (90 Base) MCG/ACT inhaler Commonly known as: ProAir HFA Inhale 2 puffs into the lungs at bedtime as needed for wheezing or shortness of breath.   amLODipine 10 MG tablet Commonly known as: NORVASC Take 1 tablet (10 mg total) by mouth daily.   atorvastatin 40 MG tablet Commonly known as: LIPITOR Take 40 mg by mouth daily.   blood glucose meter kit and supplies Kit Dispense based on patient and insurance preference. Use at least three times a day to check BS levels and fluctuation.   budesonide-formoterol 160-4.5 MCG/ACT inhaler Commonly known as: SYMBICORT Inhale 2 puffs into the lungs 2 (two) times daily.   dextromethorphan 30 MG/5ML liquid Commonly known as: DELSYM Take 5 mLs (30 mg total) by mouth 2 (two) times daily as needed for cough.   fluticasone 50 MCG/ACT nasal spray Commonly known as: FLONASE Place 1 spray into both nostrils as needed for allergies or rhinitis.   insulin aspart 100 UNIT/ML injection Commonly known as: novoLOG Inject  8-10 Units into the skin 3 (three) times daily before meals. Per sliding scale   LORazepam 0.5 MG tablet Commonly known as: ATIVAN Take 0.5 mg by mouth at bedtime as needed for sleep.   meclizine 25 MG tablet Commonly known as: ANTIVERT Take 1 tablet (25 mg total) by mouth 3 (three) times daily as needed for dizziness.   metFORMIN 750 MG 24 hr tablet Commonly known as: GLUCOPHAGE-XR Take 750 mg by mouth daily.   propranolol ER 80 MG 24 hr capsule Commonly known as: INDERAL LA Take 1 capsule (80 mg total) by mouth at bedtime.   testosterone cypionate 200 MG/ML injection Commonly known as: DEPOTESTOSTERONE CYPIONATE Inject 200 mg into the muscle every 14 (fourteen) days.   Toujeo SoloStar 300 UNIT/ML Solostar Pen Generic drug: insulin glargine (1 Unit Dial) Inject 35 Units into the skin daily. What changed:  how much to take when to take this  Follow-up Information     Celene Squibb, MD. Schedule an appointment as soon as possible for a visit .   Specialty: Internal Medicine Why: For recheck of your symptoms Contact information: [address] [place] Reichert [facility] [address] [phone]         Derek Jack, MD. Schedule an appointment as soon as possible for a visit in 1 week(s).   Specialty: Hematology Why: Hospital Follow Up Contact information: [place] [address] [phone]                Allergies  Allergen Reactions   Bee Venom Anaphylaxis   Adhesive [Tape] Hives   Ciprofloxacin     Pt does not remember reaction    Codeine     Pt does not remember reaction    Iodine     Pt does not remember reaction     Povidone-Iodine     Pt does not remember reaction    Simvastatin     Pt does not remember reaction    Latex Rash   Allergies as of 10/29/2022       Reactions   Bee Venom Anaphylaxis   Adhesive [tape] Hives   Ciprofloxacin    Pt does not remember reaction    Codeine    Pt does not remember reaction     Iodine    Pt does not remember reaction    Povidone-iodine    Pt does not remember reaction    Simvastatin    Pt does not remember reaction    Latex Rash        Medication List     STOP taking these medications    aspirin EC 81 MG tablet       TAKE these medications    acetaminophen 325 MG tablet Commonly known as: TYLENOL Take 650 mg by mouth every 6 (six) hours as needed for mild pain or moderate pain.   albuterol 108 (90 Base) MCG/ACT inhaler Commonly known as: ProAir HFA Inhale 2 puffs into the lungs at bedtime as needed for wheezing or shortness of breath.   amLODipine 10 MG tablet Commonly known as: NORVASC Take 1 tablet (10 mg total) by mouth daily.   atorvastatin 40 MG tablet Commonly known as: LIPITOR Take 40 mg by mouth daily.   blood glucose meter kit and supplies Kit Dispense based on patient and insurance preference. Use at least three times a day to check BS levels and fluctuation.   budesonide-formoterol 160-4.5 MCG/ACT inhaler Commonly known as: SYMBICORT Inhale 2 puffs into the lungs 2 (two) times daily.   dextromethorphan 30 MG/5ML liquid Commonly known as: DELSYM Take 5 mLs (30 mg total) by mouth 2 (two) times daily as needed for cough.   fluticasone 50 MCG/ACT nasal spray Commonly known as: FLONASE Place 1 spray into both nostrils as needed for allergies or rhinitis.   insulin aspart 100 UNIT/ML injection Commonly known as: novoLOG Inject 8-10 Units into the skin 3 (three) times daily before meals. Per sliding scale   LORazepam 0.5 MG tablet Commonly known as: ATIVAN Take 0.5 mg by mouth at bedtime as needed for sleep.   meclizine 25 MG tablet Commonly known as: ANTIVERT Take 1 tablet (25 mg total) by mouth 3 (three) times daily as needed for dizziness.   metFORMIN 750 MG 24 hr tablet Commonly known as: GLUCOPHAGE-XR Take 750 mg by mouth daily.   propranolol ER 80 MG 24 hr capsule Commonly known as: INDERAL  LA Take 1 capsule  (80 mg total) by mouth at bedtime.   testosterone cypionate 200 MG/ML injection Commonly known as: DEPOTESTOSTERONE CYPIONATE Inject 200 mg into the muscle every 14 (fourteen) days.   Toujeo SoloStar 300 UNIT/ML Solostar Pen Generic drug: insulin glargine (1 Unit Dial) Inject 35 Units into the skin daily. What changed:  how much to take when to take this        Procedures/Studies: MR BRAIN W WO CONTRAST  Result Date: 10/28/2022 CLINICAL DATA:  History of colon and prostate cancer, metastatic workup. EXAM: MRI HEAD WITHOUT AND WITH CONTRAST TECHNIQUE: Multiplanar, multiecho pulse sequences of the brain and surrounding structures were obtained without and with intravenous contrast. CONTRAST:  73mL GADAVIST GADOBUTROL 1 MMOL/ML IV SOLN COMPARISON:  10/12/2021 FINDINGS: Brain: No enhancement or swelling to suggest metastatic disease. No incidental infarct, hemorrhage, hydrocephalus, or collection. Mild-to-moderate chronic small vessel ischemia in the cerebral white matter and pons. Mild for age cerebral volume loss Vascular: Normal flow voids and vascular enhancements Skull and upper cervical spine: Normal marrow signal Sinuses/Orbits: Bilateral cataract resection. No evidence of mass or inflammation IMPRESSION: Negative for metastatic disease.  No acute or reversible finding. Electronically Signed   By: Jorje Guild M.D.   On: 10/28/2022 09:06   CT CHEST ABDOMEN PELVIS W CONTRAST  Result Date: 10/26/2022 CLINICAL DATA:  Cough and congestion for 2 weeks. Now with hemoptysis. Patient also reports rectal bleeding. History of colon and prostate cancer. Status post prostatectomy. * Tracking Code: BO * EXAM: CT CHEST, ABDOMEN, AND PELVIS WITH CONTRAST TECHNIQUE: Multidetector CT imaging of the chest, abdomen and pelvis was performed following the standard protocol during bolus administration of intravenous contrast. RADIATION DOSE REDUCTION: This exam was performed according to the departmental  dose-optimization program which includes automated exposure control, adjustment of the mA and/or kV according to patient size and/or use of iterative reconstruction technique. CONTRAST:  75mL OMNIPAQUE IOHEXOL 300 MG/ML  SOLN COMPARISON:  CT chest, abdomen and pelvis 05/19/2021 FINDINGS: CT CHEST FINDINGS Cardiovascular: Aortic atherosclerosis. Normal heart size. No pericardial effusion. Mediastinum/Nodes: Thyroid gland, trachea, and esophagus are unremarkable. -left supraclavicular lymph node is new measuring 1.6 cm, image 7/2. -Subcarinal lymph node is enlarged measuring 1.9 cm, image 31/2. -new left paratracheal lymph node measures 1.5 cm, image 26/2. -mass versus conglomerate adenopathy in the left hilum measures 2.7 by 5.8 by 5.5 cm, image 31/2 and coronal image 105/3. Lungs/Pleura: -Within the superior segment of the right lower lobe there is a spiculated nodule measuring 1.4 cm, image 64/5. New from previous exam. -Tiny solid nodule within the posterior right apex measures 5 mm, image 47/5. New from the prior exam No pleural fluid or airspace disease. No atelectasis or pneumothorax identified Musculoskeletal: Status post ACDF within lower cervical spine. No acute or suspicious osseous findings. CT ABDOMEN PELVIS FINDINGS Hepatobiliary: No suspicious liver abnormality. Gallstones are noted measuring up to 7 mm. No gallbladder wall inflammation or signs of bile duct dilatation. Pancreas: Unremarkable. No pancreatic ductal dilatation or surrounding inflammatory changes. Spleen: Normal in size without focal abnormality. Adrenals/Urinary Tract: Bilateral Bosniak class 1 cysts are identified. These measure up to 3.6 cm. No follow-up recommended. Slightly exophytic lesion arising off the posterior right kidney does not meet CT criteria for a benign cyst measuring 9 mm and 50 Hounsfield units, image 76/2. This may represent a small enhancing lesion or hemorrhagic/proteinaceous cyst. No hydronephrosis or  nephrolithiasis. Urinary bladder is unremarkable. Stomach/Bowel: Stomach appears normal. The appendix is visualized and appears  within normal limits. No pathologic dilatation of the large or small bowel loops. Distal colonic diverticulosis without signs of acute diverticulitis. No obstructing mass noted. Vascular/Lymphatic: Aortic atherosclerosis. No enlarged abdominal or pelvic lymph nodes. Reproductive: Prostate gland is surgically absent. Penile prosthesis reservoir is noted within the right inguinal region. Other: No free fluid or fluid collections. Musculoskeletal: Stable sclerotic lesion within the right iliac bone which is favored to represent a benign bone island no suspicious bone lesions identified. No acute osseous findings. IMPRESSION: 1. There is a spiculated nodule within the superior segment of the right lower lobe which is new from previous exam and worrisome for primary bronchogenic carcinoma. 2. Enlarged left supraclavicular, left paratracheal subcarinal and left hilar lymph nodes compatible with metastatic adenopathy. 3. No signs of metastatic disease within the abdomen or pelvis. 4. Gallstones. 5. Slightly exophytic lesion arising off the posterior right kidney does not meet CT criteria for a benign cyst measuring 9 mm and 50 Hounsfield units. This may represent a small enhancing lesion or hemorrhagic/proteinaceous cyst. This does not require emergent attention. However, when the patient is clinically stable consider more definitive characterization with dedicated, outpatient renal protocol CT or MRI. Alternatively referral to urologist may be considered further management. 6. Status post prostatectomy. 7.  Aortic Atherosclerosis (ICD10-I70.0). Electronically Signed   By: Kerby Moors M.D.   On: 10/26/2022 11:35   DG Chest Port 1 View  Result Date: 10/26/2022 CLINICAL DATA:  Possible sepsis.  Cough and congestion for 2 weeks EXAM: PORTABLE CHEST 1 VIEW COMPARISON:  10/09/2021 FINDINGS:  Numerous leads and wires project over the chest. Lower cervical spine fixation. Moderate right hemidiaphragm elevation. Midline trachea. Normal heart size for level of inspiration. Atherosclerosis in the transverse aorta. Possible tiny left pleural effusion. No pneumothorax. No congestive failure. Subtle obscuration of the left hemidiaphragm. IMPRESSION: Possible tiny left pleural effusion and adjacent left base airspace disease which could represent atelectasis or early infection. If the patient can undergo PA and lateral radiographs, these should be considered. Aortic Atherosclerosis (ICD10-I70.0). Electronically Signed   By: Abigail Miyamoto M.D.   On: 10/26/2022 09:45     Subjective: Cough but nonproductive and hemoptysis stopped since stopping aspirin.  Pt wants to go home.   Discharge Exam: Vitals:   10/29/22 0809 10/29/22 1145  BP:  117/73  Pulse: 66 60  Resp: 18 20  Temp:  97.7 F (36.5 C)  SpO2: 96% 96%   Vitals:   10/28/22 2142 10/29/22 0339 10/29/22 0809 10/29/22 1145  BP:  (!) 141/103  117/73  Pulse:  74 66 60  Resp:  20 18 20   Temp:  98.8 F (37.1 C)  97.7 F (36.5 C)  TempSrc:  Oral  Oral  SpO2: 97% 93% 96% 96%  Weight:      Height:       General: Pt is alert, awake, not in acute distress Cardiovascular: normal S1/S2 +, no rubs, no gallops Respiratory: CTA bilaterally, no wheezing, no rhonchi Abdominal: Soft, NT, ND, bowel sounds + Extremities: no edema, no cyanosis   The results of significant diagnostics from this hospitalization (including imaging, microbiology, ancillary and laboratory) are listed below for reference.     Microbiology: Recent Results (from the past 240 hour(s))  Resp panel by RT-PCR (RSV, Flu A&B, Covid) Anterior Nasal Swab     Status: None   Collection Time: 10/26/22  9:00 AM   Specimen: Anterior Nasal Swab  Result Value Ref Range Status   SARS Coronavirus 2 by  RT PCR NEGATIVE NEGATIVE Final    Comment: (NOTE) SARS-CoV-2 target nucleic  acids are NOT DETECTED.  The SARS-CoV-2 RNA is generally detectable in upper respiratory specimens during the acute phase of infection. The lowest concentration of SARS-CoV-2 viral copies this assay can detect is 138 copies/mL. A negative result does not preclude SARS-Cov-2 infection and should not be used as the sole basis for treatment or other patient management decisions. A negative result may occur with  improper specimen collection/handling, submission of specimen other than nasopharyngeal swab, presence of viral mutation(s) within the areas targeted by this assay, and inadequate number of viral copies(<138 copies/mL). A negative result must be combined with clinical observations, patient history, and epidemiological information. The expected result is Negative.  Fact Sheet for Patients:  EntrepreneurPulse.com.au  Fact Sheet for Healthcare Providers:  IncredibleEmployment.be  This test is no t yet approved or cleared by the [place] FDA and  has been authorized for detection and/or diagnosis of SARS-CoV-2 by FDA under an Emergency Use Authorization (EUA). This EUA will remain  in effect (meaning this test can be used) for the duration of the COVID-19 declaration under Section 564(b)(1) of the Act, 21 U.S.C.section 360bbb-3(b)(1), unless the authorization is terminated  or revoked sooner.       Influenza A by PCR NEGATIVE NEGATIVE Final   Influenza B by PCR NEGATIVE NEGATIVE Final    Comment: (NOTE) The Xpert Xpress SARS-CoV-2/FLU/RSV plus assay is intended as an aid in the diagnosis of influenza from Nasopharyngeal swab specimens and should not be used as a sole basis for treatment. Nasal washings and aspirates are unacceptable for Xpert Xpress SARS-CoV-2/FLU/RSV testing.  Fact Sheet for Patients: EntrepreneurPulse.com.au  Fact Sheet for Healthcare Providers: IncredibleEmployment.be  This  test is not yet approved or cleared by the [place] FDA and has been authorized for detection and/or diagnosis of SARS-CoV-2 by FDA under an Emergency Use Authorization (EUA). This EUA will remain in effect (meaning this test can be used) for the duration of the COVID-19 declaration under Section 564(b)(1) of the Act, 21 U.S.C. section 360bbb-3(b)(1), unless the authorization is terminated or revoked.     Resp Syncytial Virus by PCR NEGATIVE NEGATIVE Final    Comment: (NOTE) Fact Sheet for Patients: EntrepreneurPulse.com.au  Fact Sheet for Healthcare Providers: IncredibleEmployment.be  This test is not yet approved or cleared by the [place] FDA and has been authorized for detection and/or diagnosis of SARS-CoV-2 by FDA under an Emergency Use Authorization (EUA). This EUA will remain in effect (meaning this test can be used) for the duration of the COVID-19 declaration under Section 564(b)(1) of the Act, 21 U.S.C. section 360bbb-3(b)(1), unless the authorization is terminated or revoked.  Performed at [facility], [address] [place], [place] [address]   Blood culture (routine single)     Status: None (Preliminary result)   Collection Time: 10/26/22  9:03 AM   Specimen: Right Antecubital; Blood  Result Value Ref Range Status   Specimen Description   Final    RIGHT ANTECUBITAL BOTTLES DRAWN AEROBIC AND ANAEROBIC   Special Requests Blood Culture adequate volume  Final   Culture   Final    NO GROWTH 3 DAYS Performed at [facility], [address] [place], [place] [address]    Report Status PENDING  Incomplete     Labs: BNP (last 3 results) No results for input(s): "BNP" in the last 8760 hours. Basic Metabolic Panel: Recent Labs  Lab 10/26/22 0902 10/27/22 0532 10/28/22 0502  NA 138 138 134*  K 3.2* 4.3 4.3  CL 103 104 101  CO2 22 26 25   GLUCOSE 149* 178* 241*  BUN 29* 22 18  CREATININE 1.59* 1.45* 1.36*  CALCIUM  8.6* 8.5* 8.3*  MG  --  1.4* 1.9   Liver Function Tests: Recent Labs  Lab 10/26/22 0902 10/27/22 0532  AST 15 16  ALT 13 12  ALKPHOS 102 88  BILITOT 1.2 0.8  PROT 6.8 5.9*  ALBUMIN 3.5 3.0*   No results for input(s): "LIPASE", "AMYLASE" in the last 168 hours. No results for input(s): "AMMONIA" in the last 168 hours. CBC: Recent Labs  Lab 10/26/22 0902 10/27/22 0532 10/28/22 0502 10/29/22 0500  WBC 6.8 5.8 6.3 6.6  NEUTROABS 5.1  --   --   --   HGB 10.6* 10.0* 9.5* 9.6*  HCT 32.6* 31.2* 29.7* 30.1*  MCV 91.1 93.4 94.0 92.0  PLT 143* 144* 138* 147*   Cardiac Enzymes: No results for input(s): "CKTOTAL", "CKMB", "CKMBINDEX", "TROPONINI" in the last 168 hours. BNP: Invalid input(s): "POCBNP" CBG: Recent Labs  Lab 10/28/22 1745 10/28/22 2123 10/29/22 0335 10/29/22 0736 10/29/22 1125  GLUCAP 113* 207* 169* 168* 207*   D-Dimer No results for input(s): "DDIMER" in the last 72 hours. Hgb A1c Recent Labs    10/27/22 0532  HGBA1C 7.2*   Lipid Profile No results for input(s): "CHOL", "HDL", "LDLCALC", "TRIG", "CHOLHDL", "LDLDIRECT" in the last 72 hours. Thyroid function studies No results for input(s): "TSH", "T4TOTAL", "T3FREE", "THYROIDAB" in the last 72 hours.  Invalid input(s): "FREET3" Anemia work up No results for input(s): "VITAMINB12", "FOLATE", "FERRITIN", "TIBC", "IRON", "RETICCTPCT" in the last 72 hours. Urinalysis    Component Value Date/Time   COLORURINE YELLOW 10/27/2022 1240   APPEARANCEUR CLEAR 10/27/2022 1240   LABSPEC 1.017 10/27/2022 1240   PHURINE 5.0 10/27/2022 1240   GLUCOSEU 150 (A) 10/27/2022 1240   HGBUR NEGATIVE 10/27/2022 1240   BILIRUBINUR NEGATIVE 10/27/2022 1240   BILIRUBINUR negative 05/04/2021 1702   KETONESUR NEGATIVE 10/27/2022 1240   PROTEINUR NEGATIVE 10/27/2022 1240   UROBILINOGEN 1.0 05/04/2021 1702   UROBILINOGEN 0.2 06/30/2015 1552   NITRITE NEGATIVE 10/27/2022 1240   LEUKOCYTESUR NEGATIVE 10/27/2022 1240    Sepsis Labs Recent Labs  Lab 10/26/22 0902 10/27/22 0532 10/28/22 0502 10/29/22 0500  WBC 6.8 5.8 6.3 6.6   Microbiology Recent Results (from the past 240 hour(s))  Resp panel by RT-PCR (RSV, Flu A&B, Covid) Anterior Nasal Swab     Status: None   Collection Time: 10/26/22  9:00 AM   Specimen: Anterior Nasal Swab  Result Value Ref Range Status   SARS Coronavirus 2 by RT PCR NEGATIVE NEGATIVE Final    Comment: (NOTE) SARS-CoV-2 target nucleic acids are NOT DETECTED.  The SARS-CoV-2 RNA is generally detectable in upper respiratory specimens during the acute phase of infection. The lowest concentration of SARS-CoV-2 viral copies this assay can detect is 138 copies/mL. A negative result does not preclude SARS-Cov-2 infection and should not be used as the sole basis for treatment or other patient management decisions. A negative result may occur with  improper specimen collection/handling, submission of specimen other than nasopharyngeal swab, presence of viral mutation(s) within the areas targeted by this assay, and inadequate number of viral copies(<138 copies/mL). A negative result must be combined with clinical observations, patient history, and epidemiological information. The expected result is Negative.  Fact Sheet for Patients:  EntrepreneurPulse.com.au  Fact Sheet for Healthcare Providers:  IncredibleEmployment.be  This test  is no t yet approved or cleared by the [place] and  has been authorized for detection and/or diagnosis of SARS-CoV-2 by FDA under an Emergency Use Authorization (EUA). This EUA will remain  in effect (meaning this test can be used) for the duration of the COVID-19 declaration under Section 564(b)(1) of the Act, 21 U.S.C.section 360bbb-3(b)(1), unless the authorization is terminated  or revoked sooner.       Influenza A by PCR NEGATIVE NEGATIVE Final   Influenza B by PCR NEGATIVE NEGATIVE Final     Comment: (NOTE) The Xpert Xpress SARS-CoV-2/FLU/RSV plus assay is intended as an aid in the diagnosis of influenza from Nasopharyngeal swab specimens and should not be used as a sole basis for treatment. Nasal washings and aspirates are unacceptable for Xpert Xpress SARS-CoV-2/FLU/RSV testing.  Fact Sheet for Patients: EntrepreneurPulse.com.au  Fact Sheet for Healthcare Providers: IncredibleEmployment.be  This test is not yet approved or cleared by the [place] FDA and has been authorized for detection and/or diagnosis of SARS-CoV-2 by FDA under an Emergency Use Authorization (EUA). This EUA will remain in effect (meaning this test can be used) for the duration of the COVID-19 declaration under Section 564(b)(1) of the Act, 21 U.S.C. section 360bbb-3(b)(1), unless the authorization is terminated or revoked.     Resp Syncytial Virus by PCR NEGATIVE NEGATIVE Final    Comment: (NOTE) Fact Sheet for Patients: EntrepreneurPulse.com.au  Fact Sheet for Healthcare Providers: IncredibleEmployment.be  This test is not yet approved or cleared by the [place] FDA and has been authorized for detection and/or diagnosis of SARS-CoV-2 by FDA under an Emergency Use Authorization (EUA). This EUA will remain in effect (meaning this test can be used) for the duration of the COVID-19 declaration under Section 564(b)(1) of the Act, 21 U.S.C. section 360bbb-3(b)(1), unless the authorization is terminated or revoked.  Performed at [facility], [address] [place], [place] [address]   Blood culture (routine single)     Status: None (Preliminary result)   Collection Time: 10/26/22  9:03 AM   Specimen: Right Antecubital; Blood  Result Value Ref Range Status   Specimen Description   Final    RIGHT ANTECUBITAL BOTTLES DRAWN AEROBIC AND ANAEROBIC   Special Requests Blood Culture adequate volume  Final   Culture    Final    NO GROWTH 3 DAYS Performed at [facility], [address] [place], [place] [address]    Report Status PENDING  Incomplete   Time coordinating discharge: 36 mins   SIGNED:  Irwin Brakeman, MD  [facility] 10/29/2022, 12:21 PM How to contact the [facility] Attending or Consulting provider [place] or covering provider during after hours [place], for this patient?  Check the care team in [facility] and look for a) attending/consulting [facility] provider listed and b) the [facility] team listed Log into [email or web address] and use [place]'s universal password to access. If you do not have the password, please contact the hospital operator. Locate the [facility] provider you are looking for under [facility] and page to a number that you can be directly reached. If you still have difficulty reaching the provider, please page the [facility] (Director on Call) for the Hospitalists listed on amion for assistance.

## 2022-10-29 NOTE — Progress Notes (Signed)
Spoke with patients daughter, she states patient has a history of hospital delirium during previous stays. At this time patient is A & O x4, calm, oxygen is on and fluids are running.

## 2022-10-29 NOTE — Progress Notes (Signed)
Patient is wanting to leave the hospital. He keeps trying his daughter and son in law to come get him. He has called his family several times. He has asked me to call them. He will not keep his oxygen on. He still refuses his IV fluids. He cannot remember how he got to the hospital. He did not remember where he was until I told him.

## 2022-10-29 NOTE — Care Management Important Message (Signed)
Important Message  Patient Details  Name: [name] MRN: [id number] Date of Birth: 03-21-1935   Medicare Important Message Given:  N/A - LOS <3 / Initial given by admissions     Tommy Medal 10/29/2022, 12:38 PM

## 2022-10-30 ENCOUNTER — Encounter (INDEPENDENT_AMBULATORY_CARE_PROVIDER_SITE_OTHER): Payer: Self-pay | Admitting: Gastroenterology

## 2022-10-31 ENCOUNTER — Encounter: Payer: Self-pay | Admitting: *Deleted

## 2022-10-31 LAB — CULTURE, BLOOD (SINGLE)
Culture: NO GROWTH
Special Requests: ADEQUATE

## 2022-10-31 NOTE — Progress Notes (Signed)
Patient sent a [facility] inquiring about obtaining home O2.  States he felt much better while using it in the hospital.  O2 sats at home are dropping in mid 80's.  Order sent to [employer] for home O2 assessment.

## 2022-11-01 DIAGNOSIS — N200 Calculus of kidney: Secondary | ICD-10-CM | POA: Diagnosis not present

## 2022-11-01 DIAGNOSIS — R0602 Shortness of breath: Secondary | ICD-10-CM | POA: Diagnosis not present

## 2022-11-01 DIAGNOSIS — N35013 Post-traumatic anterior urethral stricture: Secondary | ICD-10-CM | POA: Diagnosis not present

## 2022-11-01 DIAGNOSIS — R0902 Hypoxemia: Secondary | ICD-10-CM | POA: Diagnosis not present

## 2022-11-01 DIAGNOSIS — N281 Cyst of kidney, acquired: Secondary | ICD-10-CM | POA: Diagnosis not present

## 2022-11-01 DIAGNOSIS — K5904 Chronic idiopathic constipation: Secondary | ICD-10-CM | POA: Diagnosis not present

## 2022-11-01 DIAGNOSIS — R918 Other nonspecific abnormal finding of lung field: Secondary | ICD-10-CM | POA: Diagnosis not present

## 2022-11-03 ENCOUNTER — Other Ambulatory Visit: Payer: Self-pay | Admitting: Radiology

## 2022-11-05 ENCOUNTER — Other Ambulatory Visit: Payer: Self-pay

## 2022-11-05 ENCOUNTER — Encounter (HOSPITAL_COMMUNITY): Payer: Self-pay

## 2022-11-05 ENCOUNTER — Ambulatory Visit (HOSPITAL_COMMUNITY)
Admission: RE | Admit: 2022-11-05 | Discharge: 2022-11-05 | Disposition: A | Discharge status: Home / Self Care | Payer: Medicare Other | Source: Ambulatory Visit | Attending: Interventional Radiology | Admitting: Interventional Radiology

## 2022-11-05 DIAGNOSIS — E538 Deficiency of other specified B group vitamins: Secondary | ICD-10-CM | POA: Insufficient documentation

## 2022-11-05 DIAGNOSIS — E785 Hyperlipidemia, unspecified: Secondary | ICD-10-CM | POA: Insufficient documentation

## 2022-11-05 DIAGNOSIS — E1151 Type 2 diabetes mellitus with diabetic peripheral angiopathy without gangrene: Secondary | ICD-10-CM | POA: Insufficient documentation

## 2022-11-05 DIAGNOSIS — Z452 Encounter for adjustment and management of vascular access device: Secondary | ICD-10-CM | POA: Diagnosis not present

## 2022-11-05 DIAGNOSIS — Z8546 Personal history of malignant neoplasm of prostate: Secondary | ICD-10-CM | POA: Insufficient documentation

## 2022-11-05 DIAGNOSIS — C770 Secondary and unspecified malignant neoplasm of lymph nodes of head, face and neck: Secondary | ICD-10-CM | POA: Insufficient documentation

## 2022-11-05 DIAGNOSIS — C3490 Malignant neoplasm of unspecified part of unspecified bronchus or lung: Secondary | ICD-10-CM

## 2022-11-05 DIAGNOSIS — I10 Essential (primary) hypertension: Secondary | ICD-10-CM | POA: Insufficient documentation

## 2022-11-05 DIAGNOSIS — Z85038 Personal history of other malignant neoplasm of large intestine: Secondary | ICD-10-CM | POA: Insufficient documentation

## 2022-11-05 DIAGNOSIS — G4733 Obstructive sleep apnea (adult) (pediatric): Secondary | ICD-10-CM | POA: Insufficient documentation

## 2022-11-05 DIAGNOSIS — K219 Gastro-esophageal reflux disease without esophagitis: Secondary | ICD-10-CM | POA: Diagnosis not present

## 2022-11-05 DIAGNOSIS — R590 Localized enlarged lymph nodes: Secondary | ICD-10-CM | POA: Diagnosis not present

## 2022-11-05 DIAGNOSIS — C3492 Malignant neoplasm of unspecified part of left bronchus or lung: Secondary | ICD-10-CM | POA: Diagnosis not present

## 2022-11-05 DIAGNOSIS — I6529 Occlusion and stenosis of unspecified carotid artery: Secondary | ICD-10-CM | POA: Insufficient documentation

## 2022-11-05 DIAGNOSIS — I2510 Atherosclerotic heart disease of native coronary artery without angina pectoris: Secondary | ICD-10-CM | POA: Insufficient documentation

## 2022-11-05 DIAGNOSIS — R911 Solitary pulmonary nodule: Secondary | ICD-10-CM | POA: Diagnosis not present

## 2022-11-05 HISTORY — PX: IR IMAGING GUIDED PORT INSERTION: IMG5740

## 2022-11-05 HISTORY — PX: IR US GUIDE BX ASP/DRAIN: IMG2392

## 2022-11-05 LAB — GLUCOSE, CAPILLARY: Glucose-Capillary: 140 mg/dL — ABNORMAL HIGH (ref 70–99)

## 2022-11-05 MED ORDER — LIDOCAINE HCL 1 % IJ SOLN
INTRAMUSCULAR | Status: AC
  Administered 2022-11-05: 3 mL
  Filled 2022-11-05: qty 20

## 2022-11-05 MED ORDER — SODIUM CHLORIDE 0.9 % IV SOLN: INTRAVENOUS | Status: DC

## 2022-11-05 MED ORDER — MIDAZOLAM HCL 2 MG/2ML IJ SOLN
INTRAMUSCULAR | Status: AC | PRN
  Administered 2022-11-05: .5 mg via INTRAVENOUS
  Administered 2022-11-05: 1 mg via INTRAVENOUS
  Administered 2022-11-05: .5 mg via INTRAVENOUS

## 2022-11-05 MED ORDER — MIDAZOLAM HCL 2 MG/2ML IJ SOLN
INTRAMUSCULAR | Status: AC
  Filled 2022-11-05: qty 2

## 2022-11-05 MED ORDER — LIDOCAINE-EPINEPHRINE 1 %-1:100000 IJ SOLN
INTRAMUSCULAR | Status: AC
  Administered 2022-11-05: 20 mL
  Filled 2022-11-05: qty 1

## 2022-11-05 MED ORDER — FENTANYL CITRATE (PF) 100 MCG/2ML IJ SOLN
INTRAMUSCULAR | Status: AC
  Filled 2022-11-05: qty 2

## 2022-11-05 MED ORDER — HEPARIN SOD (PORK) LOCK FLUSH 100 UNIT/ML IV SOLN
INTRAVENOUS | Status: AC
  Administered 2022-11-05: 500 [IU]
  Filled 2022-11-05: qty 5

## 2022-11-05 MED ORDER — FENTANYL CITRATE (PF) 100 MCG/2ML IJ SOLN
INTRAMUSCULAR | Status: AC | PRN
  Administered 2022-11-05: 25 ug via INTRAVENOUS
  Administered 2022-11-05: 50 ug via INTRAVENOUS
  Administered 2022-11-05: 25 ug via INTRAVENOUS

## 2022-11-05 NOTE — Procedures (Signed)
Interventional Radiology Procedure Note  Procedure: US guided biopsy of left supraclavicular pathologic node.  Complications: None EBL: None Recommendations: - Proceed with port - Routine wound care - Follow up pathology    Signed,  Corrie Mckusick, DO

## 2022-11-05 NOTE — Discharge Instructions (Signed)
Discharge Instructions:   Please call Interventional Radiology clinic [phone] with any questions or concerns.  You may remove your dressing and shower tomorrow.  Do not use EMLA / Lidocaine cream for 2 weeks post Port Insertion this will remove the surgical glue.  Moderate Conscious Sedation, Adult, Care After This sheet gives you information about how to care for yourself after your procedure. Your health care provider may also give you more specific instructions. If you have problems or questions, contact your health care provider. What can I expect after the procedure? After the procedure, it is common to have: Sleepiness for several hours. Impaired judgment for several hours. Difficulty with balance. Vomiting if you eat too soon. Follow these instructions at home: For the time period you were told by your health care provider: Rest. Do not participate in activities where you could fall or become injured. Do not drive or use machinery. Do not drink alcohol. Do not take sleeping pills or medicines that cause drowsiness. Do not make important decisions or sign legal documents. Do not take care of children on your own. Eating and drinking  Follow the diet recommended by your health care provider. Drink enough fluid to keep your urine pale yellow. If you vomit: Drink water, juice, or soup when you can drink without vomiting. Make sure you have little or no nausea before eating solid foods. General instructions Take over-the-counter and prescription medicines only as told by your health care provider. Have a responsible adult stay with you for the time you are told. It is important to have someone help care for you until you are awake and alert. Do not smoke. Keep all follow-up visits as told by your health care provider. This is important. Contact a health care provider if: You are still sleepy or having trouble with balance after 24 hours. You feel light-headed. You keep  feeling nauseous or you keep vomiting. You develop a rash. You have a fever. You have redness or swelling around the IV site. Get help right away if: You have trouble breathing. You have new-onset confusion at home. Summary After the procedure, it is common to feel sleepy, have impaired judgment, or feel nauseous if you eat too soon. Rest after you get home. Know the things you should not do after the procedure. Follow the diet recommended by your health care provider and drink enough fluid to keep your urine pale yellow. Get help right away if you have trouble breathing or new-onset confusion at home. This information is not intended to replace advice given to you by your health care provider. Make sure you discuss any questions you have with your health care provider. Document Revised: 01/07/2020 Document Reviewed: 08/05/2019 Elsevier Patient Education  [place] Insertion, Care After The following information offers guidance on how to care for yourself after your procedure. Your health care provider may also give you more specific instructions. If you have problems or questions, contact your health care provider. What can I expect after the procedure? After the procedure, it is common to have: Discomfort at the port insertion site. Bruising on the skin over the port. This should improve over 3-4 days. Follow these instructions at home: [facility] care After your port is placed, you will get a manufacturer's information card. The card has information about your port. Keep this card with you at all times. Take care of the port as told by your health care provider. Ask your health care provider if you  or a family member can get training for taking care of the port at home. A home health care nurse will be be available to help care for the port. Make sure to remember what type of port you have. Incision care     Follow instructions from your health care provider  about how to take care of your port insertion site. Make sure you: Wash your hands with soap and water for at least 20 seconds before and after you change your bandage (dressing). If soap and water are not available, use hand sanitizer. Change your dressing as told by your health care provider. Leave stitches (sutures), skin glue, or adhesive strips in place. These skin closures may need to stay in place for 2 weeks or longer. If adhesive strip edges start to loosen and curl up, you may trim the loose edges. Do not remove adhesive strips completely unless your health care provider tells you to do that. Check your port insertion site every day for signs of infection. Check for: Redness, swelling, or pain. Fluid or blood. Warmth. Pus or a bad smell. Activity Return to your normal activities as told by your health care provider. Ask your health care provider what activities are safe for you. You may have to avoid lifting. Ask your health care provider how much you can safely lift. General instructions Take over-the-counter and prescription medicines only as told by your health care provider. Do not take baths, swim, or use a hot tub until your health care provider approves. Ask your health care provider if you may take showers. You may only be allowed to take sponge baths. If you were given a sedative during the procedure, it can affect you for several hours. Do not drive or operate machinery until your health care provider says that it is safe. Wear a medical alert bracelet in case of an emergency. This will tell any health care providers that you have a port. Keep all follow-up visits. This is important. Contact a health care provider if: You cannot flush your port with saline as directed, or you cannot draw blood from the port. You have a fever or chills. You have redness, swelling, or pain around your port insertion site. You have fluid or blood coming from your port insertion site. Your port  insertion site feels warm to the touch. You have pus or a bad smell coming from the port insertion site. Get help right away if: You have chest pain or shortness of breath. You have bleeding from your port that you cannot control. These symptoms may be an emergency. Get help right away. Call 911. Do not wait to see if the symptoms will go away. Do not drive yourself to the hospital. Summary Take care of the port as told by your health care provider. Keep the manufacturer's information card with you at all times. Change your dressing as told by your health care provider. Contact a health care provider if you have a fever or chills or if you have redness, swelling, or pain around your port insertion site. Keep all follow-up visits. This information is not intended to replace advice given to you by your health care provider. Make sure you discuss any questions you have with your health care provider. Document Revised: 03/13/2021 Document Reviewed: 03/13/2021 Elsevier Patient Education  [place].    Needle Biopsy, Care After The following information offers guidance on how to care for yourself after your procedure. Your health care provider may also give  you more specific instructions. If you have problems or questions, contact your health care provider. What can I expect after the procedure? After the procedure, it is common to have: Soreness, pain, and tenderness where a tissue sample was taken (biopsy site). Bruising or mild pain at the biopsy site. These symptoms should go away after a few days. Follow these instructions at home: Biopsy site care  Follow instructions from your health care provider about how to take care of your biopsy site. Make sure you: Wash your hands with soap and water for at least 20 seconds before and after you change your bandage (dressing). If soap and water are not available, use hand sanitizer. Know when and how to change your dressing. Know when  to remove your dressing. Check your puncture site every day for signs of infection. Check for: More redness, swelling, or pain. More drainage of fluid or blood. More warmth. Pus or a bad smell. General instructions Rest as told by your health care provider. Do not take baths, swim, or use a hot tub until your health care provider approves. Ask your health care provider if you may take showers. You may only be allowed to take sponge baths. Take over-the-counter and prescription medicines only as told by your health care provider. Return to your normal activities as told by your health care provider. Ask your health care provider what activities are safe for you. If you have airplane travel scheduled, talk with your health care provider about when it is safe for you to travel by airplane. This is specific to certain biopsy procedures. It is up to you to get the results of your procedure. Ask your health care provider, or the department that is doing the procedure, when your results will be ready. Keep all follow-up visits. You may need to make an appointment to get your biopsy results. Contact a health care provider if: You have a fever. You have more redness, swelling, or pain at the puncture site that lasts longer than a few days. You have more fluid or blood coming from your puncture site. You have pus or a bad smell coming from your puncture site. Your puncture site feels warm to the touch. You have pain that does not get better with medicine. Get help right away if: You have severe bleeding from the puncture site. You have chest pain. You have problems breathing. You cough up blood. You faint. You have a very fast heart rate. These symptoms may be an emergency. Get help right away. Call 911. Do not wait to see if the symptoms will go away. Do not drive yourself to the hospital. Summary After the procedure, it is common to have soreness, bruising, tenderness, or mild pain at the  biopsy site. These symptoms should go away in a few days. Check your biopsy site every day for signs of infection, such as more redness, swelling, or pain. Do not take baths, swim, or use a hot tub until your health care provider approves. Ask your health care provider if you may take showers. Contact a heath care provider if you have more redness, swelling, or pain at the puncture site that lasts longer than a few days. This information is not intended to replace advice given to you by your health care provider. Make sure you discuss any questions you have with your health care provider. Document Revised: 09/05/2021 Document Reviewed: 09/05/2021 Elsevier Patient Education  [place].

## 2022-11-05 NOTE — Procedures (Signed)
Interventional Radiology Procedure Note  Procedure: Placement of a right IJ approach single lumen PowerPort.  Tip is positioned at the superior cavoatrial junction and catheter is ready for immediate use.  Complications: None Recommendations:  - Ok to shower tomorrow - Do not submerge for 7 days - Routine line care   Signed,  Tenika Keeran S. Treon Kehl, DO   

## 2022-11-05 NOTE — H&P (Signed)
Referring Physician(s): Katragadda,S  Supervising Physician: Corrie Mckusick  Patient Status:  [facility] OP  Chief Complaint:  "I'm getting a port a cath and biopsy"  Subjective: Pt known to IR service from port a cath placement in 2014 with removal in 2019. He is an 88 yo male  with PMH sig for vit B12 def, carotid artery disease, colon cancer (mantle cell lymphoma) 2014, CAD, HTN, GERD, HLD, OSA, prostate cancer 2009, PVD,DM who presents now with likely stage III left lung cancer. He is scheduled today for port a cath placement as well as biopsy of left supraclavicular LN.  He denies fever,HA, back pain,N/V. He does have occ left chest discomfort, dyspnea, occ cough, hemoptysis in past but none at present, some lower abd discomfort.    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, anomalous circumflex - no PCIs   Essential hypertension, benign    GERD (gastroesophageal reflux disease)    Mixed hyperlipidemia    Obstructive sleep apnea    does not use   Prostate cancer ([place])    PVD (peripheral vascular disease) ([place])    Type 2 diabetes mellitus ([place])    Past Surgical History:  Procedure Laterality Date   BACK SURGERY     BALLOON DILATION N/A 08/27/2013   Procedure: BALLOON DILATION;  Surgeon: Rogene Houston, MD;  [facility];  Service: Endoscopy;  Laterality: N/A;   COLON SURGERY     COLONOSCOPY N/A 12/17/2013   Procedure: COLONOSCOPY;  Surgeon: Rogene Houston, MD;  [facility];  Service: Endoscopy;  Laterality: N/A;  940   COLONOSCOPY N/A 02/28/2014   Procedure: COLONOSCOPY;  Surgeon: Rogene Houston, MD;  [facility];  Service: Endoscopy;  Laterality: N/A;  730   COLONOSCOPY N/A  08/04/2015   Procedure: COLONOSCOPY;  Surgeon: Rogene Houston, MD;  [facility];  Service: Endoscopy;  Laterality: N/A;  200 - moved to 11/11 @ 2:10 - Ann to notify pt   COLONOSCOPY WITH ESOPHAGOGASTRODUODENOSCOPY (EGD) N/A 08/27/2013   Procedure: COLONOSCOPY WITH ESOPHAGOGASTRODUODENOSCOPY (EGD);  Surgeon: Rogene Houston, MD;  [facility];  Service: Endoscopy;  Laterality: N/A;  730   COLONOSCOPY WITH PROPOFOL N/A 01/02/2022   Procedure: COLONOSCOPY WITH PROPOFOL;  Surgeon: Rogene Houston, MD;  [facility];  Service: Endoscopy;  Laterality: N/A;  1205   CYSTOSCOPY N/A 06/09/2021   Procedure: CYSTOSCOPY;  Surgeon: Lucas Mallow, MD;  Location: [facility];  Service: Urology;  Laterality: N/A;   IR REMOVAL TUN ACCESS W/ PORT W/O FL MOD SED  07/17/2018   KNEE ARTHROSCOPY Left    KNEE SURGERY Right    total knee   MALONEY DILATION N/A 08/27/2013   Procedure: MALONEY DILATION;  Surgeon: Rogene Houston, MD;  [facility];  Service: Endoscopy;  Laterality: N/A;   POLYPECTOMY  01/02/2022   Procedure: POLYPECTOMY;  Surgeon: Rogene Houston, MD;  [facility];  Service: Endoscopy;;  cecal;ascending;proximal, sigmoid;rectal;   PORTACATH PLACEMENT Right 09/23/2012   PROSTATECTOMY     REMOVAL OF PENILE PROSTHESIS N/A 06/09/2021   Procedure: REMOVAL OF ARTIFICIAL URINARY SPHINCER; REMOVAL OF INFLATABLE PENILE PROSTHESIS;  Surgeon: Gloriann Loan,  Desiree Hane, MD;  Location: [facility];  Service: Urology;  Laterality: N/A;   removal of port Right    SAVORY DILATION N/A 08/27/2013   Procedure: SAVORY DILATION;  Surgeon: Rogene Houston, MD;  [facility];  Service: Endoscopy;  Laterality: N/A;   SHOULDER SURGERY     TONSILLECTOMY     URINARY SPHINCTER IMPLANT N/A 01/23/2017   artificial urinary sphincter implant by Dr. Odis Luster   VASECTOMY        Allergies: Bee venom, Adhesive [tape], Ciprofloxacin, Codeine, Iodine, Povidone-iodine,  Simvastatin, and Latex  Medications: Prior to Admission medications   Medication Sig Start Date End Date Taking? Authorizing Provider  acetaminophen (TYLENOL) 325 MG tablet Take 650 mg by mouth every 6 (six) hours as needed for mild pain or moderate pain.    [provider]  albuterol (PROAIR HFA) 108 (90 Base) MCG/ACT inhaler Inhale 2 puffs into the lungs at bedtime as needed for wheezing or shortness of breath. 05/12/20   Tanda Rockers, MD  amLODipine (NORVASC) 10 MG tablet Take 1 tablet (10 mg total) by mouth daily. 01/11/22   Richardson Dopp T, PA-C  atorvastatin (LIPITOR) 40 MG tablet Take 40 mg by mouth daily. 10/05/21   [provider]  blood glucose meter kit and supplies KIT Dispense based on patient and insurance preference. Use at least three times a day to check BS levels and fluctuation. 09/04/21   Barton Dubois, MD  budesonide-formoterol [facility]) 160-4.5 MCG/ACT inhaler Inhale 2 puffs into the lungs 2 (two) times daily. 05/12/20   Tanda Rockers, MD  dextromethorphan (DELSYM) 30 MG/5ML liquid Take 5 mLs (30 mg total) by mouth 2 (two) times daily as needed for cough. 10/29/22   Johnson, Clanford L, MD  fluticasone (FLONASE) 50 MCG/ACT nasal spray Place 1 spray into both nostrils as needed for allergies or rhinitis.    [provider]  insulin aspart (NOVOLOG) 100 UNIT/ML injection Inject 8-10 Units into the skin 3 (three) times daily before meals. Per sliding scale    [provider]  LORazepam (ATIVAN) 0.5 MG tablet Take 0.5 mg by mouth at bedtime as needed for sleep.    [provider]  meclizine (ANTIVERT) 25 MG tablet Take 1 tablet (25 mg total) by mouth 3 (three) times daily as needed for dizziness. 10/15/21   Kathie Dike, MD  metFORMIN (GLUCOPHAGE-XR) 750 MG 24 hr tablet Take 750 mg by mouth daily. 11/06/21   [provider]  propranolol ER (INDERAL LA) 80 MG 24 hr capsule Take 1 capsule (80 mg total) by mouth at bedtime.  10/15/21   Kathie Dike, MD  testosterone cypionate (DEPOTESTOSTERONE CYPIONATE) 200 MG/ML injection Inject 200 mg into the muscle every 14 (fourteen) days.  04/29/16   [provider]  TOUJEO SOLOSTAR 300 UNIT/ML Solostar Pen Inject 35 Units into the skin daily. 10/29/22   Murlean Iba, MD     Vital Signs: BP 114/78   Pulse 84   Temp 98.1 F (36.7 C) (Oral)   Resp 18   SpO2 98%    Code Status: FULL CODE  Physical Exam awake/alert; chest- CTA bilat; heart- RRR; abd- soft,+BS, mildly tender ant lower abd region to palpation; no sig LE edema  Imaging: No results found.  Labs:  CBC: Recent Labs    10/26/22 0902 10/27/22 0532 10/28/22 0502 10/29/22 0500  WBC 6.8 5.8 6.3 6.6  HGB 10.6* 10.0* 9.5* 9.6*  HCT 32.6* 31.2* 29.7* 30.1*  PLT  143* 144* 138* 147*    COAGS: Recent Labs    10/26/22 0902  INR 1.1  APTT 34    BMP: Recent Labs    10/26/22 0902 10/27/22 0532 10/28/22 0502  NA 138 138 134*  K 3.2* 4.3 4.3  CL 103 104 101  CO2 22 26 25   GLUCOSE 149* 178* 241*  BUN 29* 22 18  CALCIUM 8.6* 8.5* 8.3*  CREATININE 1.59* 1.45* 1.36*  GFRNONAA 42* 47* 51*    LIVER FUNCTION TESTS: Recent Labs    10/26/22 0902 10/27/22 0532  BILITOT 1.2 0.8  AST 15 16  ALT 13 12  ALKPHOS 102 88  PROT 6.8 5.9*  ALBUMIN 3.5 3.0*    Assessment and Plan: Pt known to IR service from port a cath placement in 2014 with removal in 2019. He is an 88 yo male  with PMH sig for vit B12 def, carotid artery disease, colon cancer (mantle cell lymphoma) 2014, CAD, HTN, GERD, HLD, OSA, prostate cancer 2009, PVD,DM who presents now with likely stage III left lung cancer. He is scheduled today for port a cath placement as well as biopsy of left supraclavicular LN. Risks and benefits of image guided port-a-catheter placement was discussed with the patient/daughter  including, but not limited to bleeding, infection, pneumothorax, or fibrin sheath development and need for  additional procedures.  All of the patient's questions were answered, patient is agreeable to proceed. Consent signed and in chart.    Electronically Signed: D. Rowe Robert, PA-C 11/05/2022, 12:50 PM   I spent a total of 20 minutes at the the patient's bedside AND on the patient's hospital floor or unit, greater than 50% of which was counseling/coordinating care for port a cath placement/left supraclavicular lymph node biopsy

## 2022-11-07 ENCOUNTER — Encounter (HOSPITAL_COMMUNITY)
Admission: RE | Admit: 2022-11-07 | Discharge: 2022-11-07 | Disposition: A | Discharge status: Home / Self Care | Payer: Medicare Other | Source: Ambulatory Visit | Attending: Hematology | Admitting: Hematology

## 2022-11-07 DIAGNOSIS — C7800 Secondary malignant neoplasm of unspecified lung: Secondary | ICD-10-CM | POA: Diagnosis not present

## 2022-11-07 DIAGNOSIS — C3490 Malignant neoplasm of unspecified part of unspecified bronchus or lung: Secondary | ICD-10-CM | POA: Diagnosis not present

## 2022-11-07 DIAGNOSIS — C7972 Secondary malignant neoplasm of left adrenal gland: Secondary | ICD-10-CM | POA: Diagnosis not present

## 2022-11-07 DIAGNOSIS — C7971 Secondary malignant neoplasm of right adrenal gland: Secondary | ICD-10-CM | POA: Diagnosis not present

## 2022-11-07 MED ORDER — FLUDEOXYGLUCOSE F - 18 (FDG) INJECTION
11.1000 | Freq: Once | INTRAVENOUS | Status: AC | PRN
  Administered 2022-11-07: 11.1 via INTRAVENOUS

## 2022-11-08 DIAGNOSIS — E119 Type 2 diabetes mellitus without complications: Secondary | ICD-10-CM | POA: Diagnosis not present

## 2022-11-08 LAB — SURGICAL PATHOLOGY

## 2022-11-10 DIAGNOSIS — E1165 Type 2 diabetes mellitus with hyperglycemia: Secondary | ICD-10-CM | POA: Diagnosis not present

## 2022-11-11 ENCOUNTER — Other Ambulatory Visit: Payer: Self-pay | Admitting: *Deleted

## 2022-11-11 ENCOUNTER — Ambulatory Visit (HOSPITAL_COMMUNITY)
Admission: RE | Admit: 2022-11-11 | Discharge: 2022-11-11 | Disposition: A | Discharge status: Home / Self Care | Payer: Medicare Other | Source: Ambulatory Visit | Attending: Hematology | Admitting: Hematology

## 2022-11-11 DIAGNOSIS — M25511 Pain in right shoulder: Secondary | ICD-10-CM

## 2022-11-11 NOTE — Progress Notes (Signed)
Patient's daughter called to advise that he has had decreased range of motion of right shoulder and severe pain since Thursday evening 2/15.  Per Dr. Delton Coombes, will order DG right shoulder.  Daughter aware.

## 2022-11-12 ENCOUNTER — Emergency Department (HOSPITAL_COMMUNITY)
Admission: EM | Admit: 2022-11-12 | Discharge: 2022-11-12 | Disposition: A | Discharge status: Home / Self Care | Payer: Medicare Other | Attending: Emergency Medicine | Admitting: Emergency Medicine

## 2022-11-12 ENCOUNTER — Other Ambulatory Visit: Payer: Self-pay

## 2022-11-12 ENCOUNTER — Telehealth: Payer: Self-pay | Admitting: *Deleted

## 2022-11-12 ENCOUNTER — Emergency Department (HOSPITAL_COMMUNITY): Payer: Medicare Other

## 2022-11-12 ENCOUNTER — Encounter (HOSPITAL_COMMUNITY): Payer: Self-pay | Admitting: *Deleted

## 2022-11-12 DIAGNOSIS — M47814 Spondylosis without myelopathy or radiculopathy, thoracic region: Secondary | ICD-10-CM | POA: Diagnosis not present

## 2022-11-12 DIAGNOSIS — Z91040 Latex allergy status: Secondary | ICD-10-CM | POA: Insufficient documentation

## 2022-11-12 DIAGNOSIS — Z452 Encounter for adjustment and management of vascular access device: Secondary | ICD-10-CM | POA: Diagnosis not present

## 2022-11-12 DIAGNOSIS — R918 Other nonspecific abnormal finding of lung field: Secondary | ICD-10-CM | POA: Diagnosis not present

## 2022-11-12 DIAGNOSIS — R042 Hemoptysis: Secondary | ICD-10-CM

## 2022-11-12 DIAGNOSIS — Z85118 Personal history of other malignant neoplasm of bronchus and lung: Secondary | ICD-10-CM | POA: Insufficient documentation

## 2022-11-12 DIAGNOSIS — Z794 Long term (current) use of insulin: Secondary | ICD-10-CM | POA: Insufficient documentation

## 2022-11-12 DIAGNOSIS — R739 Hyperglycemia, unspecified: Secondary | ICD-10-CM | POA: Insufficient documentation

## 2022-11-12 DIAGNOSIS — K920 Hematemesis: Secondary | ICD-10-CM | POA: Insufficient documentation

## 2022-11-12 DIAGNOSIS — R109 Unspecified abdominal pain: Secondary | ICD-10-CM | POA: Diagnosis not present

## 2022-11-12 DIAGNOSIS — R000 Tachycardia, unspecified: Secondary | ICD-10-CM | POA: Diagnosis not present

## 2022-11-12 DIAGNOSIS — R0602 Shortness of breath: Secondary | ICD-10-CM | POA: Diagnosis not present

## 2022-11-12 DIAGNOSIS — N189 Chronic kidney disease, unspecified: Secondary | ICD-10-CM | POA: Insufficient documentation

## 2022-11-12 DIAGNOSIS — R911 Solitary pulmonary nodule: Secondary | ICD-10-CM | POA: Diagnosis not present

## 2022-11-12 LAB — CBC WITH DIFFERENTIAL/PLATELET
Abs Immature Granulocytes: 0.04 10*3/uL (ref 0.00–0.07)
Basophils Absolute: 0 10*3/uL (ref 0.0–0.1)
Basophils Relative: 1 %
Eosinophils Absolute: 0 10*3/uL (ref 0.0–0.5)
Eosinophils Relative: 0 %
HCT: 31.5 % — ABNORMAL LOW (ref 39.0–52.0)
Hemoglobin: 10.1 g/dL — ABNORMAL LOW (ref 13.0–17.0)
Immature Granulocytes: 1 %
Lymphocytes Relative: 10 %
Lymphs Abs: 0.8 10*3/uL (ref 0.7–4.0)
MCH: 29.4 pg (ref 26.0–34.0)
MCHC: 32.1 g/dL (ref 30.0–36.0)
MCV: 91.6 fL (ref 80.0–100.0)
Monocytes Absolute: 0.4 10*3/uL (ref 0.1–1.0)
Monocytes Relative: 6 %
Neutro Abs: 6.5 10*3/uL (ref 1.7–7.7)
Neutrophils Relative %: 82 %
Platelets: 181 10*3/uL (ref 150–400)
RBC: 3.44 MIL/uL — ABNORMAL LOW (ref 4.22–5.81)
RDW: 16 % — ABNORMAL HIGH (ref 11.5–15.5)
WBC: 7.8 10*3/uL (ref 4.0–10.5)
nRBC: 0 % (ref 0.0–0.2)

## 2022-11-12 LAB — COMPREHENSIVE METABOLIC PANEL
ALT: 15 U/L (ref 0–44)
AST: 16 U/L (ref 15–41)
Albumin: 3.7 g/dL (ref 3.5–5.0)
Alkaline Phosphatase: 97 U/L (ref 38–126)
Anion gap: 11 (ref 5–15)
BUN: 45 mg/dL — ABNORMAL HIGH (ref 8–23)
CO2: 28 mmol/L (ref 22–32)
Calcium: 9.2 mg/dL (ref 8.9–10.3)
Chloride: 95 mmol/L — ABNORMAL LOW (ref 98–111)
Creatinine, Ser: 1.55 mg/dL — ABNORMAL HIGH (ref 0.61–1.24)
GFR, Estimated: 44 mL/min — ABNORMAL LOW (ref >60)
Glucose, Bld: 334 mg/dL — ABNORMAL HIGH (ref 70–99)
Potassium: 4.3 mmol/L (ref 3.5–5.1)
Sodium: 134 mmol/L — ABNORMAL LOW (ref 135–145)
Total Bilirubin: 1.1 mg/dL (ref 0.3–1.2)
Total Protein: 7.4 g/dL (ref 6.5–8.1)

## 2022-11-12 LAB — PROTIME-INR
INR: 1.1 (ref 0.8–1.2)
Prothrombin Time: 14.1 seconds (ref 11.4–15.2)

## 2022-11-12 LAB — URINALYSIS, MICROSCOPIC (REFLEX): WBC, UA: >50 WBC/hpf (ref 0–5)

## 2022-11-12 LAB — URINALYSIS, ROUTINE W REFLEX MICROSCOPIC
Bilirubin Urine: NEGATIVE
Glucose, UA: 100 mg/dL — AB
Ketones, ur: NEGATIVE mg/dL
Nitrite: NEGATIVE
Specific Gravity, Urine: 1.02 (ref 1.005–1.030)
pH: 5.5 (ref 5.0–8.0)

## 2022-11-12 LAB — POC OCCULT BLOOD, ED: Fecal Occult Bld: POSITIVE — AB

## 2022-11-12 LAB — LACTIC ACID, PLASMA: Lactic Acid, Venous: 1.1 mmol/L (ref 0.5–1.9)

## 2022-11-12 MED ORDER — ONDANSETRON HCL 4 MG/2ML IJ SOLN
4.0000 mg | Freq: Once | INTRAMUSCULAR | Status: AC
  Administered 2022-11-12: 4 mg via INTRAVENOUS

## 2022-11-12 NOTE — ED Triage Notes (Addendum)
Pt reports spitting up blood since yesterday and SOB x 1 week. Denies use of blood thinners. Family reports pt has suspected lung cancer and is pale. Pt reports extreme generalized weakness.

## 2022-11-12 NOTE — Telephone Encounter (Signed)
Received call from daughter stating that patient has been spitting up blood throughout the night.  Advised to go to the ER as soon as possible.  Dr. Delton Coombes made aware.

## 2022-11-12 NOTE — Progress Notes (Signed)
Dr. Delton Coombes made aware of result.  Advised patient of results and to use ice on the area.  Verbalized understanding.

## 2022-11-12 NOTE — Inpatient Diabetes Management (Signed)
Inpatient Diabetes Program Recommendations  AACE/ADA: New Consensus Statement on Inpatient Glycemic Control (2015)  Target Ranges:  Prepandial:   less than 140 mg/dL      Peak postprandial:   less than 180 mg/dL (1-2 hours)      Critically ill patients:  140 - 180 mg/dL   Lab Results  Component Value Date   GLUCAP 140 (H) 11/05/2022   HGBA1C 7.2 (H) 10/27/2022    Review of Glycemic Control  Latest Reference Range & Units 11/12/22 09:30  Glucose 70 - 99 mg/dL 334 (H)  (H): Data is abnormally high Diabetes history: Type 2 DM Outpatient Diabetes medications: Toujeo 35 units QD, Novolog 8-10 units TID, Metformin 750 mg QD Current orders for Inpatient glycemic control: None  Inpatient Diabetes Program Recommendations:   If to be admitted: Consider adding Semglee 15 units QD and Novolog 0-9 units Q4H.  Thanks, Bronson Curb, MSN, RNC-OB Diabetes Coordinator [phone] (8a-5p)

## 2022-11-12 NOTE — ED Notes (Signed)
Pt reminded of urine needed, pt understanding. Urinal provided to pt when ready

## 2022-11-12 NOTE — Discharge Instructions (Signed)
You were seen in the emergency department for continued cough weakness with some possible coughing or vomiting up blood.  Your blood counts were stable and your chest x-ray did not show any significant changes.  Please follow-up with Dr. Delton Coombes tomorrow for reevaluation and further discussion.  Return to the emergency department if any worsening or concerning symptoms.

## 2022-11-12 NOTE — ED Provider Notes (Signed)
[facility] Provider Note   CSN: [id number] Arrival date & time: 11/12/22  5277     History  Chief Complaint  Patient presents with   Hematemesis   Shortness of Breath    [name] is a 88 y.o. male.  He is here with a complaint of coughing or vomiting up of blood and passing some blood rectally.  He was seen earlier this month for hemoptysis and found to have a new lung lesion, ultimately diagnosed with lung cancer.  He has had a PET scan and a port placed and is waiting to see oncology Dr. Delton Coombes tomorrow.  He said last night he was either coughing or vomiting up some blood and had 1 episode of dark red rectal bleeding.  He said he did have some abdominal pain but he gets that daily anyways and did not think much of it. no chest pain no fever.  He is not on blood thinners.  The history is provided by the patient.  Cough Cough characteristics:  Productive Sputum characteristics:  Bloody Duration:  1 day Timing:  Intermittent Progression:  Resolved Chronicity:  Recurrent Smoker: no   Relieved by:  None tried Worsened by:  Nothing Ineffective treatments:  None tried Associated symptoms: shortness of breath   Associated symptoms: no chest pain, no fever, no rash and no sore throat        Home Medications Prior to Admission medications   Medication Sig Start Date End Date Taking? Authorizing Provider  acetaminophen (TYLENOL) 325 MG tablet Take 650 mg by mouth every 6 (six) hours as needed for mild pain or moderate pain.    [provider]  albuterol (PROAIR HFA) 108 (90 Base) MCG/ACT inhaler Inhale 2 puffs into the lungs at bedtime as needed for wheezing or shortness of breath. 05/12/20   Tanda Rockers, MD  amLODipine (NORVASC) 10 MG tablet Take 1 tablet (10 mg total) by mouth daily. 01/11/22   Richardson Dopp T, PA-C  atorvastatin (LIPITOR) 40 MG tablet Take 40 mg by mouth daily. 10/05/21   [provider]   blood glucose meter kit and supplies KIT Dispense based on patient and insurance preference. Use at least three times a day to check BS levels and fluctuation. 09/04/21   Barton Dubois, MD  budesonide-formoterol [facility]) 160-4.5 MCG/ACT inhaler Inhale 2 puffs into the lungs 2 (two) times daily. 05/12/20   Tanda Rockers, MD  dextromethorphan (DELSYM) 30 MG/5ML liquid Take 5 mLs (30 mg total) by mouth 2 (two) times daily as needed for cough. 10/29/22   Johnson, Clanford L, MD  fluticasone (FLONASE) 50 MCG/ACT nasal spray Place 1 spray into both nostrils as needed for allergies or rhinitis.    [provider]  insulin aspart (NOVOLOG) 100 UNIT/ML injection Inject 8-10 Units into the skin 3 (three) times daily before meals. Per sliding scale    [provider]  LORazepam (ATIVAN) 0.5 MG tablet Take 0.5 mg by mouth at bedtime as needed for sleep.    [provider]  meclizine (ANTIVERT) 25 MG tablet Take 1 tablet (25 mg total) by mouth 3 (three) times daily as needed for dizziness. 10/15/21   Kathie Dike, MD  metFORMIN (GLUCOPHAGE-XR) 750 MG 24 hr tablet Take 750 mg by mouth daily. 11/06/21   [provider]  propranolol ER (INDERAL LA) 80 MG 24 hr capsule Take 1 capsule (80 mg total) by mouth at bedtime. 10/15/21   Kathie Dike,  MD  testosterone cypionate (DEPOTESTOSTERONE CYPIONATE) 200 MG/ML injection Inject 200 mg into the muscle every 14 (fourteen) days.  04/29/16   [provider]  TOUJEO SOLOSTAR 300 UNIT/ML Solostar Pen Inject 35 Units into the skin daily. 10/29/22   Johnson, Clanford L, MD      Allergies    Bee venom, Adhesive [tape], Ciprofloxacin, Codeine, Iodine, Povidone-iodine, Simvastatin, and Latex    Review of Systems   Review of Systems  Constitutional:  Negative for fever.  HENT:  Negative for sore throat.   Respiratory:  Positive for cough and shortness of breath.   Cardiovascular:  Negative for chest pain.  Gastrointestinal:   Positive for abdominal pain, blood in stool, nausea and vomiting.  Genitourinary:  Negative for dysuria.  Skin:  Negative for rash.    Physical Exam Updated Vital Signs BP (!) 148/86 (BP Location: Left Arm)   Pulse (!) 108   Temp 97.7 F (36.5 C) (Oral)   Resp (!) 24   Ht 5\' 11"  (1.803 m)   Wt 98.8 kg   SpO2 98%   BMI 30.38 kg/m  Physical Exam Vitals and nursing note reviewed.  Constitutional:      General: He is not in acute distress.    Appearance: He is well-developed.  HENT:     Head: Normocephalic and atraumatic.  Eyes:     Conjunctiva/sclera: Conjunctivae normal.  Cardiovascular:     Rate and Rhythm: Regular rhythm. Tachycardia present.     Heart sounds: No murmur heard. Pulmonary:     Effort: Pulmonary effort is normal. No respiratory distress.     Breath sounds: Wheezing (few scattered) present.  Abdominal:     Palpations: Abdomen is soft.     Tenderness: There is no abdominal tenderness. There is no guarding or rebound.  Musculoskeletal:        General: No swelling.     Cervical back: Neck supple.     Right lower leg: No tenderness. No edema.     Left lower leg: No tenderness. No edema.  Skin:    General: Skin is warm and dry.     Capillary Refill: Capillary refill takes less than 2 seconds.  Neurological:     General: No focal deficit present.     Mental Status: He is alert.     ED Results / Procedures / Treatments   Labs (all labs ordered are listed, but only abnormal results are displayed) Labs Reviewed  COMPREHENSIVE METABOLIC PANEL - Abnormal; Notable for the following components:      Result Value   Sodium 134 (*)    Chloride 95 (*)    Glucose, Bld 334 (*)    BUN 45 (*)    Creatinine, Ser 1.55 (*)    GFR, Estimated 44 (*)    All other components within normal limits  CBC WITH DIFFERENTIAL/PLATELET - Abnormal; Notable for the following components:   RBC 3.44 (*)    Hemoglobin 10.1 (*)    HCT 31.5 (*)    RDW 16.0 (*)    All other  components within normal limits  URINALYSIS, ROUTINE W REFLEX MICROSCOPIC - Abnormal; Notable for the following components:   APPearance CLOUDY (*)    Glucose, UA 100 (*)    Hgb urine dipstick TRACE (*)    Protein, ur TRACE (*)    Leukocytes,Ua SMALL (*)    All other components within normal limits  URINALYSIS, MICROSCOPIC (REFLEX) - Abnormal; Notable for the following components:   Bacteria, UA  MANY (*)    All other components within normal limits  POC OCCULT BLOOD, ED - Abnormal; Notable for the following components:   Fecal Occult Bld POSITIVE (*)    All other components within normal limits  CULTURE, BLOOD (ROUTINE X 2)  CULTURE, BLOOD (ROUTINE X 2)  LACTIC ACID, PLASMA  PROTIME-INR  LACTIC ACID, PLASMA    EKG EKG Interpretation  Date/Time:  Tuesday November 12 2022 11:06:03 EST Ventricular Rate:  93 PR Interval:  159 QRS Duration: 88 QT Interval:  335 QTC Calculation: 417 R Axis:   -37 Text Interpretation: Sinus rhythm Multiple ventricular premature complexes Left axis deviation Low voltage, precordial leads Baseline wander in lead(s) V2 No significant change since prior 2/24 Confirmed by Aletta Edouard [phone]) on 11/12/2022 11:18:52 AM  Radiology DG Shoulder Right  Result Date: 11/11/2022 CLINICAL DATA:  Right shoulder pain and decreased range of motion. EXAM: RIGHT SHOULDER - 2+ VIEW COMPARISON:  Right shoulder radiographs 10/12/2021 FINDINGS: No acute fracture or dislocation is identified. Moderate acromioclavicular osteoarthrosis is again noted. Calcifications are again seen adjacent to the greater tuberosity of the humerus suggestive of calcific rotator cuff tendinopathy. A right chest Port-A-Cath is in place. IMPRESSION: 1. No acute osseous abnormality. 2. Moderate acromioclavicular osteoarthrosis. 3. Suspected calcific rotator cuff tendinopathy. Electronically Signed   By: Logan Bores M.D.   On: 11/11/2022 13:13    Procedures Procedures    Medications Ordered in  ED Medications  ondansetron (ZOFRAN) injection 4 mg (4 mg Intravenous Given 11/12/22 1000)    ED Course/ Medical Decision Making/ A&P Clinical Course as of 11/12/22 1823  Tue Nov 12, 2022  1025 Rectal exam done with nurse chaperone.  Normal tone no masses.  Sample sent to lab for guaiac. [MB]  1214 Discussed with Dr. Delton Coombes oncology.  He did not feel he needed repeat imaging. [MB]  1027 I reviewed results of testing with patient and his family member.  He said he would rather go home if his blood counts are stable and follow-up with Dr. Delton Coombes tomorrow as scheduled.  I recommended return to the emergency department if any worsening or concerning symptoms. [MB]    Clinical Course User Index [MB] Hayden Rasmussen, MD                             Medical Decision Making Amount and/or Complexity of Data Reviewed Labs: ordered. Radiology: ordered.  Risk Prescription drug management.   This patient complains of coughing or vomiting up blood, rectal bleeding; this involves an extensive number of treatment Options and is a complaint that carries with it a high risk of complications and morbidity. The differential includes hemoptysis, hematemesis, GI bleed, rectal bleed, anemia, worsening tumor burden  I ordered, reviewed and interpreted labs, which included CBC with normal white count, hemoglobin low better than priors, chemistries with chronic CKD elevated blood sugar, fecal occult positive, lactate normal, INR normal, blood culture sent I ordered medication oral Zofran and reviewed PMP when indicated. I ordered imaging studies which included chest x-ray and I independently    visualized and interpreted imaging which showed persistent masses consistent with prior identified cancer Additional history obtained from patient's family member Previous records obtained and reviewed in epic including recent PET scan and biopsy notes I consulted Dr. Delton Coombes oncology and discussed lab  and imaging findings and discussed disposition.  Cardiac monitoring reviewed, normal sinus rhythm Social determinants considered, no significant barriers Critical Interventions:  None  After the interventions stated above, I reevaluated the patient and found patient has had no further coughing hemoptysis hematemesis or rectal bleeding here.  Blood pressure remained stable. Admission and further testing considered, he was offered admission to the hospital versus home with close follow-up with oncology tomorrow.  He would rather keep his appointment with oncologist tomorrow to follow-up with their recommendations for cancer treatment.  I counseled him to return back to the emergency department if any worsening of bleeding.         Final Clinical Impression(s) / ED Diagnoses Final diagnoses:  Lung mass  Cough with hemoptysis    Rx / DC Orders ED Discharge Orders     None         Hayden Rasmussen, MD 11/12/22 [phone]

## 2022-11-13 ENCOUNTER — Inpatient Hospital Stay: Payer: Medicare Other

## 2022-11-13 ENCOUNTER — Inpatient Hospital Stay: Payer: Medicare Other | Attending: Hematology | Admitting: Hematology

## 2022-11-13 VITALS — BP 110/77 | HR 101 | Temp 98.6°F | Resp 16

## 2022-11-13 DIAGNOSIS — C7951 Secondary malignant neoplasm of bone: Secondary | ICD-10-CM | POA: Diagnosis not present

## 2022-11-13 DIAGNOSIS — C7972 Secondary malignant neoplasm of left adrenal gland: Secondary | ICD-10-CM | POA: Diagnosis not present

## 2022-11-13 DIAGNOSIS — R110 Nausea: Secondary | ICD-10-CM | POA: Insufficient documentation

## 2022-11-13 DIAGNOSIS — C7971 Secondary malignant neoplasm of right adrenal gland: Secondary | ICD-10-CM | POA: Insufficient documentation

## 2022-11-13 DIAGNOSIS — Z9981 Dependence on supplemental oxygen: Secondary | ICD-10-CM | POA: Insufficient documentation

## 2022-11-13 DIAGNOSIS — N390 Urinary tract infection, site not specified: Secondary | ICD-10-CM | POA: Diagnosis not present

## 2022-11-13 DIAGNOSIS — Z8546 Personal history of malignant neoplasm of prostate: Secondary | ICD-10-CM | POA: Insufficient documentation

## 2022-11-13 DIAGNOSIS — C3492 Malignant neoplasm of unspecified part of left bronchus or lung: Secondary | ICD-10-CM

## 2022-11-13 DIAGNOSIS — M25511 Pain in right shoulder: Secondary | ICD-10-CM | POA: Diagnosis not present

## 2022-11-13 DIAGNOSIS — Z87891 Personal history of nicotine dependence: Secondary | ICD-10-CM | POA: Diagnosis not present

## 2022-11-13 DIAGNOSIS — C3432 Malignant neoplasm of lower lobe, left bronchus or lung: Secondary | ICD-10-CM | POA: Diagnosis not present

## 2022-11-13 MED ORDER — CYANOCOBALAMIN 1000 MCG/ML IJ SOLN
1000.0000 ug | Freq: Once | INTRAMUSCULAR | Status: AC
  Administered 2022-11-13: 1000 ug via INTRAMUSCULAR
  Filled 2022-11-13: qty 1

## 2022-11-13 MED ORDER — SULFAMETHOXAZOLE-TRIMETHOPRIM 800-160 MG PO TABS: 1.0000 | ORAL_TABLET | Freq: Two times a day (BID) | ORAL | 0 refills | Status: DC

## 2022-11-13 MED ORDER — FOLIC ACID 1 MG PO TABS: 1.0000 mg | ORAL_TABLET | Freq: Every day | ORAL | 3 refills | Status: DC

## 2022-11-13 MED ORDER — PROCHLORPERAZINE MALEATE 10 MG PO TABS: 10.0000 mg | ORAL_TABLET | Freq: Four times a day (QID) | ORAL | 2 refills | Status: DC | PRN

## 2022-11-13 NOTE — Progress Notes (Signed)
[facility] [address] [place] [address]    Clinic Day:  11/13/2022  Referring physician: Celene Squibb, MD  Patient Care Team: Celene Squibb, MD as PCP - General Johney Frame Greer Ee, MD as PCP - Cardiology (Cardiology) Rogene Houston, MD as Consulting Physician (Gastroenterology) Sharmon Revere as Physician Assistant (Cardiology) Derek Jack, MD as Medical Oncologist (Medical Oncology) Brien Mates, RN as Oncology Nurse Navigator (Medical Oncology)   ASSESSMENT & PLAN:   Assessment: 1.  Metastatic poorly differentiated adenocarcinoma of the lung to the bones and adrenals: - Presentation with cough 3 months, mild hemoptysis 3 weeks.  No weight loss. - Lives at home with his wife.  Independent of ADLs and IADLs.  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  Smoked for 1 year, 1 to 2 cigarettes/day.  3 sisters had breast cancer.  2 brothers had lung cancer. - CT CAP (10/26/2022): 1.4 cm left lower lobe lung nodule.  5 mm nodule in the right apex.  Mass/conglomerate adenopathy in the left hilum 2.7 x 5.8 x 5.8 cm.  Subcarinal lymph node 1.9 cm.  Left supraclavicular lymph node 1.6 cm.  Left paratracheal lymph node 1.5 cm.  No findings suggestive of metastatic disease in the abdomen and pelvis. - Brain MRI (10/28/2022): Negative for metastatic disease.    2. Prostate cancer: - Robotic assisted laparoscopic prostatectomy on 02/24/2008 at [facility] with extranodal extension, perineural invasion, positive margin.  He received adjuvant XRT/ADT until 08/26/2008. - Last PSA in 2018 undetectable.    3. Mantle cell lymphoma of the colon: - Colon biopsy (08/27/2013) by Dr. Laural Golden consistent with mantle cell lymphoma. - Bendamustine/rituximab for 6 cycles from 09/14/2013 through 02/07/2014 - Maintenance rituximab from 05/10/2014 through 04/01/2016   PLAN:  1.  Metastatic poorly differentiated adenocarcinoma  of the lung to the bones and adrenals: -We have reviewed pathology report in detail. - We reviewed PET scan from 11/07/2022 which showed skeletal metastasis involving the left femur, T3 transverse process and several left ribs.  There is also uptake in the left scapula at the shoulder joint.  There is metastatic disease in the bilateral adrenals. - We discussed further management of metastatic lung cancer including best supportive care in the form of hospice versus active treatments.  Patient would like to have everything done for his malignancy. - I have recommended NGS testing to see if he is eligible for any targeted therapies or immunotherapy. - We will give him B12 injection today and start him on folic acid. - He has been on oxygen 24/7 since discharge from the hospital.  He has occasional very small amount of hemoptysis. - RTC 3 weeks for follow-up.  2.  Right shoulder pain: - He reports right shoulder pain since after the PET scan.  Reports 9 out of 10 in intensity. - I have reviewed PET scan which showed increased uptake in the scapula. - Will make a referral to radiation oncology for possible radiation for pain control.  3.  UTI: - He was evaluated in the ER yesterday.  Urine showed abnormal urinalysis.  Will give him Bactrim DS for 5 days.  4.  Nausea: - Reports nausea intermittently with pain.  Recommend starting Compazine 10 mg every 6 hours as needed.  No orders of the defined types were placed in this encounter.     Beverly Gust Oliver,acting as a scribe for Derek Jack, MD.,have documented all relevant  documentation on the behalf of Derek Jack, MD,as directed by  Derek Jack, MD while in the presence of Derek Jack, MD.   I, Derek Jack MD, have reviewed the above documentation for accuracy and completeness, and I agree with the above.   Derek Jack, MD   2/21/20246:20 PM  CHIEF COMPLAINT:   Diagnosis: Metastatic  adenocarcinoma of the lung   Cancer Staging  Primary lung adenocarcinoma, left [facility]) Staging form: Lung, AJCC 8th Edition - Clinical stage from 11/13/2022: Stage IVB (cT1b, cN3, cM1c) - Unsigned    Prior Therapy: none  Current Therapy: Under workup pending NGS results  HISTORY OF PRESENT ILLNESS:   Oncology History  Mantle cell lymphoma ([place])  08/27/2013 Initial Diagnosis   Colon, biopsy, random - MANTLE CELL LYMPHOMA   09/06/2013 Imaging   CT CAP- No significant lymphadenopathy identified within the chest, abdomen or pelvis.   09/14/2013 - 02/07/2014 Chemotherapy   Bendamustine/Rituxan x 6 cycles with Neulasta support   12/17/2013 Procedure   Colonoscopy with biopsy by Dr. Laural Golden.   12/17/2013 Pathology Results   Colon, biopsy, ascending and transverse - BENIGN APPEARING COLONIC MUCOSA WITH SCATTERED ATYPICAL LYMPHOCYTES.Overall, these findings are suspicious for minimal residual mantle cell lymphoma.   02/28/2014 Procedure   Colonoscopy with biopsy by Dr. Laural Golden   02/28/2014 Pathology Results   Colon, biopsy, proximal and distal - BENIGN COLONIC MUCOSA. - NO SIGNIFICANT INFLAMMATION OR OTHER ABNORMALITIES IDENTIFIED. - NO EVIDENCE OF LYMPHOMA, ADENOMATOUS CHANGES OR MALIGNANCY.   02/28/2014 Remission   No evidence of malignancy on colonosocpy and pathology from biopsy.   05/10/2014 - 04/01/2016 Chemotherapy   Maintenance Rituxan 500 mg/m x 2 years.   05/11/2015 Imaging   No definite findings to suggest metastatic disease in chest, abdomen or pelvis, new ground glass atten in lungs B, nonspecific, repeat 6 to 12 mo   06/30/2015 - 07/02/2015 Hospital Admission   [facility]   08/04/2015 Procedure   Colonoscopy with biopsy by Dr. Laural Golden.   08/04/2015 Pathology Results   Colon, biopsy, right and left. - BENIGN COLORECTAL MUCOSA. - ASSOCIATED BENIGN LYMPHOID AGGREGATES. - NO EVIDENCE OF SIGNIFICANT INFLAMMATION, DYSPLASIA OR MALIGNANCY.   05/10/2016 Imaging   CT CAP- 1. No definite  findings to suggest metastatic disease in the chest, abdomen or pelvis. There are several new patchy areas of ground-glass attenuation in the lungs bilaterally which are highly nonspecific. The possibility of developing interstitial lung disease is not excluded, and repeat high-resolution chest CT is suggested in 6-12 months to assess for temporal changes in the appearance of the lung parenchyma.    06/17/2016 Imaging   CT chest high resolution- 1. No convincing findings of interstitial lung disease. Minimal subpleural reticulation and ground-glass attenuation in the dependent lower lobes is not significantly changed since 05/11/2015, and appears slightly less prominent on the inspiratory sequence, suggesting a combination of mild hypoventilatory change and minimal nonspecific scarring. No traction bronchiectasis or frank honeycombing. 2. Two tiny solid pulmonary nodules in the right middle lobe and left lower lobe are stable and probably benign. 3. Left lower lobe ground-glass 11 mm nodule is stable. Given persistence, repeat CT is recommended every 2 years until 5 years of stability has been established. This recommendation follows the consensus statement: Guidelines for Management of Incidental Pulmonary Nodules Detected on CT Images: From the Fleischner Society 2017; Radiology 2017; 284:228-243. 4. Additional findings include aortic atherosclerosis and coronary atherosclerosis.   08/19/2016 Imaging   CT abd/pelvis- 1. Tiny nonobstructive stones in the right kidney. No  cause for left flank pain identified. 2. Diverticulosis without focal diverticulitis. 3. Atherosclerosis. 4. Rounded density over the left lower chest on the scout view only, not seen on the June 17, 2016 CT is likely something on the patient's such as an EKG lead. Recommend clinical correlation. 5. Cholelithiasis.      INTERVAL HISTORY:   Jovonte is a 88 y.o. male presenting to clinic today for follow  up of stage III lung cancer. He was last seen by me on 10/28/2022.  On 11/12/2022 he was in the ED for Lung mass cough with hemoptysis.  Today, he states that he is doing well overall. His appetite level is at His appetite was 20%. His energy level is at 0%. He has 9/10 right shoulder pain. He takes Tylenol but it help only a little.  He reports nausea daily and has vomited x 2 since he was discharged home.  He is on oxygen via nasal cannula 24/7.  This is new from recent hospitalization.   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, anomalous circumflex - no PCIs   Essential hypertension, benign    GERD (gastroesophageal reflux disease)    Mixed hyperlipidemia    Obstructive sleep apnea    does not use   Prostate cancer (HCC)    PVD (peripheral vascular disease) ([place])    Type 2 diabetes mellitus ([place])     Surgical History: Past Surgical History:  Procedure Laterality Date   BACK SURGERY     BALLOON DILATION N/A 08/27/2013   Procedure: BALLOON DILATION;  Surgeon: Rogene Houston, MD;  [facility];  Service: Endoscopy;  Laterality: N/A;   COLON SURGERY     COLONOSCOPY N/A 12/17/2013   Procedure: COLONOSCOPY;  Surgeon: Rogene Houston, MD;  [facility];  Service: Endoscopy;  Laterality: N/A;  940   COLONOSCOPY N/A 02/28/2014   Procedure: COLONOSCOPY;  Surgeon: Rogene Houston, MD;  [facility];  Service: Endoscopy;  Laterality: N/A;  730   COLONOSCOPY N/A 08/04/2015   Procedure: COLONOSCOPY;  Surgeon: Rogene Houston, MD;  [facility];  Service: Endoscopy;  Laterality: N/A;  200 - moved to 11/11 @ 2:10 - Ann to notify pt    COLONOSCOPY WITH ESOPHAGOGASTRODUODENOSCOPY (EGD) N/A 08/27/2013   Procedure: COLONOSCOPY WITH ESOPHAGOGASTRODUODENOSCOPY (EGD);  Surgeon: Rogene Houston, MD;  [facility];  Service: Endoscopy;  Laterality: N/A;  730   COLONOSCOPY WITH PROPOFOL N/A 01/02/2022   Procedure: COLONOSCOPY WITH PROPOFOL;  Surgeon: Rogene Houston, MD;  [facility];  Service: Endoscopy;  Laterality: N/A;  1205   CYSTOSCOPY N/A 06/09/2021   Procedure: CYSTOSCOPY;  Surgeon: Lucas Mallow, MD;  Location: [facility];  Service: Urology;  Laterality: N/A;   IR IMAGING GUIDED PORT INSERTION  11/05/2022   IR REMOVAL TUN ACCESS W/ PORT W/O FL MOD SED  07/17/2018   IR US GUIDE BX ASP/DRAIN  11/05/2022   KNEE ARTHROSCOPY Left    KNEE SURGERY Right    total knee   MALONEY DILATION N/A 08/27/2013   Procedure: MALONEY DILATION;  Surgeon: Rogene Houston, MD;  [facility];  Service: Endoscopy;  Laterality: N/A;   POLYPECTOMY  01/02/2022   Procedure: POLYPECTOMY;  Surgeon: Rogene Houston, MD;  [facility];  Service: Endoscopy;;  cecal;ascending;proximal, sigmoid;rectal;   PORTACATH PLACEMENT Right 09/23/2012   PROSTATECTOMY     REMOVAL OF PENILE PROSTHESIS N/A 06/09/2021   Procedure: REMOVAL OF ARTIFICIAL URINARY SPHINCER; REMOVAL OF INFLATABLE PENILE PROSTHESIS;  Surgeon: Lucas Mallow, MD;  Location: [facility];  Service: Urology;  Laterality: N/A;   removal of port Right    SAVORY DILATION N/A 08/27/2013   Procedure: SAVORY DILATION;  Surgeon: Rogene Houston, MD;  [facility];  Service: Endoscopy;  Laterality: N/A;   SHOULDER SURGERY     TONSILLECTOMY     URINARY SPHINCTER IMPLANT N/A 01/23/2017   artificial urinary sphincter implant by Dr. Odis Luster   VASECTOMY      Social History: Social History   Socioeconomic History   Marital status: Married    Spouse name: [name]   Number of children: 2   Years of education: 9th grade   Highest education level: Not on  file  Occupational History   Occupation: Retired     Fish farm manager: RETIRED  Tobacco Use   Smoking status: Never   Smokeless tobacco: Never  Vaping Use   Vaping Use: Never used  Substance and Sexual Activity   Alcohol use: No   Drug use: No   Sexual activity: Yes    Birth control/protection: None  Other Topics Concern   Not on file  Social History Narrative   Lives with wife   Caffeine use: coffee 1 cup some days of the week. Update 02/10/2020 ("very rarely though")   Right-handed   Social Determinants of Health   Financial Resource Strain: Not on file  Food Insecurity: No Food Insecurity (10/26/2022)   Hunger Vital Sign    Worried About Running Out of Food in the Last Year: Never true    Ran Out of Food in the Last Year: Never true  Transportation Needs: No Transportation Needs (10/26/2022)   PRAPARE - Hydrologist (Medical): No    Lack of Transportation (Non-Medical): No  Physical Activity: Not on file  Stress: Not on file  Social Connections: Not on file  Intimate Partner Violence: Not At Risk (10/26/2022)   Humiliation, Afraid, Rape, and Kick questionnaire    Fear of Current or Ex-Partner: No    Emotionally Abused: No    Physically Abused: No    Sexually Abused: No    Family History: Family History  Problem Relation Age of Onset   Colon cancer Brother    Cancer Brother    Diabetes Father    Cancer Brother     Current Medications:  Current Outpatient Medications:    acetaminophen (TYLENOL) 325 MG tablet, Take 650 mg by mouth every 6 (six) hours as needed for mild pain or moderate pain., Disp: , Rfl:    albuterol (PROAIR HFA) 108 (90 Base) MCG/ACT inhaler, Inhale 2 puffs into the lungs at bedtime as needed for wheezing or shortness of breath., Disp: 18 g, Rfl: 1   amLODipine (NORVASC) 10 MG tablet, Take 1 tablet (10 mg total) by mouth daily., Disp: 90 tablet, Rfl: 3   atorvastatin (LIPITOR) 40 MG tablet, Take 40 mg by mouth daily., Disp: ,  Rfl:    blood glucose meter kit and supplies KIT, Dispense based on patient and insurance preference. Use at least three times a day to check BS  levels and fluctuation., Disp: 1 each, Rfl: 0   budesonide-formoterol (SYMBICORT) 160-4.5 MCG/ACT inhaler, Inhale 2 puffs into the lungs 2 (two) times daily., Disp: 1 each, Rfl: 11   dextromethorphan-guaiFENesin (MUCINEX DM) 30-600 MG 12hr tablet, Take 1 tablet by mouth 2 (two) times daily as needed for cough., Disp: , Rfl:    fluticasone (FLONASE) 50 MCG/ACT nasal spray, Place 1 spray into both nostrils as needed for allergies or rhinitis., Disp: , Rfl:    folic acid (FOLVITE) 1 MG tablet, Take 1 tablet (1 mg total) by mouth daily., Disp: 90 tablet, Rfl: 3   insulin aspart (NOVOLOG) 100 UNIT/ML injection, Inject 8-10 Units into the skin 3 (three) times daily before meals. Per sliding scale, Disp: , Rfl:    LORazepam (ATIVAN) 0.5 MG tablet, Take 0.5 mg by mouth at bedtime as needed for sleep., Disp: , Rfl:    meclizine (ANTIVERT) 25 MG tablet, Take 1 tablet (25 mg total) by mouth 3 (three) times daily as needed for dizziness., Disp: 30 tablet, Rfl: 0   metFORMIN (GLUCOPHAGE-XR) 750 MG 24 hr tablet, Take 750 mg by mouth daily., Disp: , Rfl:    pantoprazole (PROTONIX) 40 MG tablet, Take 40 mg by mouth 2 (two) times daily., Disp: , Rfl:    prochlorperazine (COMPAZINE) 10 MG tablet, Take 1 tablet (10 mg total) by mouth every 6 (six) hours as needed for nausea or vomiting., Disp: 30 tablet, Rfl: 2   propranolol ER (INDERAL LA) 80 MG 24 hr capsule, Take 1 capsule (80 mg total) by mouth at bedtime., Disp: 30 capsule, Rfl: 0   sulfamethoxazole-trimethoprim (BACTRIM DS) 800-160 MG tablet, Take 1 tablet by mouth 2 (two) times daily., Disp: 10 tablet, Rfl: 0   testosterone cypionate (DEPOTESTOSTERONE CYPIONATE) 200 MG/ML injection, Inject 200 mg into the muscle every 14 (fourteen) days. , Disp: , Rfl:    TOUJEO SOLOSTAR 300 UNIT/ML Solostar Pen, Inject 35 Units into the  skin daily., Disp: , Rfl:  No current facility-administered medications for this visit.  Facility-Administered Medications Ordered in Other Visits:    heparin lock flush 100 unit/mL, 500 Units, Intravenous, Once, Kefalas, Thomas S, PA-C   sodium chloride flush (NS) 0.9 % injection 20 mL, 20 mL, Intravenous, PRN, Sheldon Silvan, Manon Hilding, PA-C   Allergies: Allergies  Allergen Reactions   Bee Venom Anaphylaxis   Iodine     Pt does not remember reaction  Other Reaction(s): GI Intolerance, Hypotension, Other (See Comments)  syncope   Adhesive [Tape] Hives   Ciprofloxacin     Pt does not remember reaction    Povidone-Iodine     Pt does not remember reaction    Simvastatin     Pt does not remember reaction    Codeine     Pt does not remember reaction  Other Reaction(s): GI Intolerance   Latex Rash    REVIEW OF SYSTEMS:   Review of Systems  Respiratory:  Positive for cough and shortness of breath.   Gastrointestinal:  Positive for nausea.  Neurological:  Positive for dizziness.     VITALS:   Blood pressure 110/77, pulse (!) 101, temperature 98.6 F (37 C), temperature source Tympanic, resp. rate 16, SpO2 97 %.  Wt Readings from Last 3 Encounters:  11/12/22 217 lb 13 oz (98.8 kg)  11/05/22 217 lb 13 oz (98.8 kg)  10/26/22 217 lb 13 oz (98.8 kg)    There is no height or weight on file to calculate BMI.  Performance status (ECOG): 1 -  Symptomatic but completely ambulatory  PHYSICAL EXAM:   Physical Exam Vitals and nursing note reviewed. Exam conducted with a chaperone present.  Constitutional:      Appearance: Normal appearance.  Cardiovascular:     Rate and Rhythm: Normal rate and regular rhythm.     Pulses: Normal pulses.     Heart sounds: Normal heart sounds.  Pulmonary:     Effort: Pulmonary effort is normal.     Breath sounds: Normal breath sounds.  Abdominal:     Palpations: Abdomen is soft. There is no hepatomegaly, splenomegaly or mass.     Tenderness: There  is no abdominal tenderness.  Musculoskeletal:     Right lower leg: No edema.     Left lower leg: No edema.  Lymphadenopathy:     Cervical: No cervical adenopathy.     Right cervical: No superficial, deep or posterior cervical adenopathy.    Left cervical: No superficial, deep or posterior cervical adenopathy.     Upper Body:     Right upper body: No supraclavicular or axillary adenopathy.     Left upper body: No supraclavicular or axillary adenopathy.  Neurological:     General: No focal deficit present.     Mental Status: He is alert and oriented to person, place, and time.  Psychiatric:        Mood and Affect: Mood normal.        Behavior: Behavior normal.     LABS:      Latest Ref Rng & Units 11/12/2022    9:30 AM 10/29/2022    5:00 AM 10/28/2022    5:02 AM  CBC  WBC 4.0 - 10.5 K/uL 7.8  6.6  6.3   Hemoglobin 13.0 - 17.0 g/dL 10.1  9.6  9.5   Hematocrit 39.0 - 52.0 % 31.5  30.1  29.7   Platelets 150 - 400 K/uL 181  147  138       Latest Ref Rng & Units 11/12/2022    9:30 AM 10/28/2022    5:02 AM 10/27/2022    5:32 AM  CMP  Glucose 70 - 99 mg/dL 334  241  178   BUN 8 - 23 mg/dL 45  18  22   Creatinine 0.61 - 1.24 mg/dL 1.55  1.36  1.45   Sodium 135 - 145 mmol/L 134  134  138   Potassium 3.5 - 5.1 mmol/L 4.3  4.3  4.3   Chloride 98 - 111 mmol/L 95  101  104   CO2 22 - 32 mmol/L 28  25  26    Calcium 8.9 - 10.3 mg/dL 9.2  8.3  8.5   Total Protein 6.5 - 8.1 g/dL 7.4   5.9   Total Bilirubin 0.3 - 1.2 mg/dL 1.1   0.8   Alkaline Phos 38 - 126 U/L 97   88   AST 15 - 41 U/L 16   16   ALT 0 - 44 U/L 15   12      No results found for: "CEA1", "CEA" / No results found for: "CEA1", "CEA" No results found for: "PSA1" No results found for: "BSJ628" No results found for: "CAN125"  No results found for: "TOTALPROTELP", "ALBUMINELP", "A1GS", "A2GS", "BETS", "BETA2SER", "GAMS", "MSPIKE", "SPEI" Lab Results  Component Value Date   TIBC 278 05/24/2014   FERRITIN 163 05/24/2014    IRONPCTSAT 30 05/24/2014   Lab Results  Component Value Date   LDH 131 02/23/2018   LDH 131 09/25/2017  LDH 136 07/14/2017     STUDIES:   DG Chest 2 View  Result Date: 11/12/2022 CLINICAL DATA:  Suspected sepsis. EXAM: CHEST - 2 VIEW COMPARISON:  Chest radiographs 12/25/2022, 10/09/2021, 09/03/2021, 05/19/2021; CT chest 10/26/2022. FINDINGS: Right chest wall porta catheter with tip overlying the central superior vena cava, new from prior. Moderately decreased lung volumes are mildly improved from prior. Cardiac silhouette is at the upper limits of normal size. There is enlargement of the left hilum which appears more prominent than on prior 10/26/2022 and 10/09/2021 radiographs indefinitely new from 05/19/2021 radiographs. This corresponds to the mass versus conglomerate adenopathy seen on 10/26/2022 chest CT. The 1.4 cm spiculated nodule within the superior segment of the left lower lobe seen on recent 10/26/2022 CT is not well visualized on radiographs. There is left lower lung horizontal linear interstitial thickening that appears unchanged from multiple prior radiographs. No pleural effusion or pneumothorax. Mild-to-moderate multilevel degenerative disc changes of the thoracic spine. IMPRESSION: 1. Enlargement of the left hilum corresponds to the mass versus conglomerate adenopathy seen on 10/26/2022 chest CT. 2. The 1.4 cm spiculated nodule within the superior segment of the left lower lobe seen on recent 10/26/2022 CT is not well visualized on radiographs. 3. Mildly increased interstitial thickening within the left lower lung appears similar to multiple prior radiographs and may represent scarring versus subsegmental atelectasis. It is difficult to exclude early pneumonia. Electronically Signed   By: Yvonne Kendall M.D.   On: 11/12/2022 10:34   DG Shoulder Right  Result Date: 11/11/2022 CLINICAL DATA:  Right shoulder pain and decreased range of motion. EXAM: RIGHT SHOULDER - 2+ VIEW  COMPARISON:  Right shoulder radiographs 10/12/2021 FINDINGS: No acute fracture or dislocation is identified. Moderate acromioclavicular osteoarthrosis is again noted. Calcifications are again seen adjacent to the greater tuberosity of the humerus suggestive of calcific rotator cuff tendinopathy. A right chest Port-A-Cath is in place. IMPRESSION: 1. No acute osseous abnormality. 2. Moderate acromioclavicular osteoarthrosis. 3. Suspected calcific rotator cuff tendinopathy. Electronically Signed   By: Logan Bores M.D.   On: 11/11/2022 13:13   NM PET Image Initial (PI) Skull Base To Thigh  Result Date: 11/07/2022 CLINICAL DATA:  Subsequent treatment strategy for non-small cell lung cancer. EXAM: NUCLEAR MEDICINE PET SKULL BASE TO THIGH TECHNIQUE: 11.1 mCi F-18 FDG was injected intravenously. Full-ring PET imaging was performed from the skull base to thigh after the radiotracer. CT data was obtained and used for attenuation correction and anatomic localization. Fasting blood glucose: 195 mg/dl COMPARISON:  CT chest abdomen pelvis 10/26/2022 FINDINGS: Mediastinal blood pool activity: SUV max 3.3 Liver activity: SUV max NA NECK: Intensely hypermetabolic and enlarged LEFT supraclavicular lymph node measures 2.2 cm with SUV max equal 11.1 (image 66) Incidental CT findings: None. CHEST: Hypermetabolic LEFT lower paratracheal, subcarinal and hilar adenopathy. Example subcarinal node measures 2 cm SUV max equal 11.3. Bulky LEFT infrahilar node measures 3 cm with SUV max equal 9.0 on image 102). Solitary hypermetabolic nodule in the LEFT lower lobe measures 1.2 cm with SUV max equal 2.6 on image 96) Incidental CT findings: None. ABDOMEN/PELVIS: Hypermetabolic RIGHT adrenal gland with SUV max equal 8.0 (image 150). A small nodule on CT portion exam measures 10 mm. Rounded LEFT adrenal gland with SUV max equal 5.6 is also suspicious for metastasis. No abnormal metabolic activity liver. No hypermetabolic abdominopelvic lymph  nodes. Hypermetabolic activity through the proximal rectum with SUV max equal 8.0. No clear CT lesion. Skeletal: Hypermetabolic lesion proximal LEFT femur just below  the lesser trochanter with SUV max equal 5.6 on image 277. Focal uptake in the anterior LEFT chest wall at at the junction of the first rib and manubrium with SUV max equal 8.3 is favored skeletal metastasis. Focal uptake in the LEFT transverse process of the T3 vertebral body (image 73) Lateral LEFT rib lesion on image 824 is hypermetabolic. IMPRESSION: 1. Hypermetabolic LEFT hilar and mediastinal metastatic adenopathy. Large hypermetabolic metastatic LEFT supraclavicular lymph node. Favor lung cancer metastasis. 2. Hypermetabolic nodule within the LEFT lower lobe representing a metastatic lesion or primary bronchogenic carcinoma. 3. Bilateral hypermetabolic adrenal metastasis. 4. Several skeletal hypermetabolic metastasis. Lesions in the proximal LEFT femur, T3 transverse process, and several LEFT ribs. Electronically Signed   By: Suzy Bouchard M.D.   On: 11/07/2022 12:57   IR IMAGING GUIDED PORT INSERTION  Result Date: 11/05/2022 INDICATION: 88 year old male with lung carcinoma referred for port catheter and biopsy of left supraclavicular lymph node EXAM: IMPLANTED PORT A CATH PLACEMENT WITH ULTRASOUND AND FLUOROSCOPIC GUIDANCE IMAGE GUIDED BIOPSY OF LEFT SUPRACLAVICULAR LYMPH NODE MEDICATIONS: None ANESTHESIA/SEDATION: Moderate (conscious) sedation was employed during this procedure. A total of Versed 2.0 mg and Fentanyl 100 mcg was administered intravenously by the radiology nurse. Total intra-service moderate Sedation Time: 38 minutes. The patient's level of consciousness and vital signs were monitored continuously by radiology nursing throughout the procedure under my direct supervision. FLUOROSCOPY: Radiation Exposure Index (as provided by the fluoroscopic device): 1 mGy Kerma COMPLICATIONS: None PROCEDURE: The procedure, risks, benefits,  and alternatives were explained to the patient. Questions regarding the procedure were encouraged and answered. The patient understands and consents to the procedure. Ultrasound survey was performed of the left supraclavicular region with images stored and sent to PACs. The left neck was prepped with chlorhexidine in a sterile fashion, and a sterile drape was applied covering the operative field. A sterile gown and sterile gloves were used for the procedure. Local anesthesia was provided with 1% Lidocaine. Ultrasound guidance was used to infiltrate the region with 1% lidocaine for local anesthesia. Four separate 18 gauge core biopsy were then acquired of the pathologic left supraclavicular lymph node using ultrasound guidance. Images were stored. Tissue placed into fresh specimen. Final image was stored after biopsy. We then proceeded with port catheter. The patient was prepped and draped in the usual sterile fashion. 1% lidocaine was used for local anesthesia. Ultrasound survey was performed with images stored and sent to PACs. Right IJ vein documented to be patent. The right neck and chest was prepped with chlorhexidine, and draped in the usual sterile fashion using maximum barrier technique (cap and mask, sterile gown, sterile gloves, large sterile sheet, hand hygiene and cutaneous antiseptic). Local anesthesia was attained by infiltration with 1% lidocaine without epinephrine. Ultrasound demonstrated patency of the right internal jugular vein, and this was documented with an image. Under real-time ultrasound guidance, this vein was accessed with a 21 gauge micropuncture needle and image documentation was performed. A small dermatotomy was made at the access site with an 11 scalpel. A 0.018" wire was advanced into the SVC and used to estimate the length of the internal catheter. The access needle exchanged for a 53F micropuncture vascular sheath. The 0.018" wire was then removed and a 0.035" wire advanced into the  IVC. An appropriate location for the subcutaneous reservoir was selected below the clavicle and an incision was made through the skin and underlying soft tissues. The subcutaneous tissues were then dissected using a combination of blunt and sharp surgical  technique and a pocket was formed. A single lumen power injectable portacatheter was then tunneled through the subcutaneous tissues from the pocket to the dermatotomy and the port reservoir placed within the subcutaneous pocket. The venous access site was then serially dilated and a peel away vascular sheath placed over the wire. The wire was removed and the port catheter advanced into position under fluoroscopic guidance. The catheter tip is positioned in the cavoatrial junction. This was documented with a spot image. The portacatheter was then tested and found to flush and aspirate well. The port was flushed with saline followed by 100 units/mL heparinized saline. The pocket was then closed in two layers using first subdermal inverted interrupted absorbable sutures followed by a running subcuticular suture. The epidermis was then sealed with Dermabond. The dermatotomy at the venous access site was also seal with Dermabond. Patient tolerated the procedure well and remained hemodynamically stable throughout. No complications encountered and no significant blood loss encountered IMPRESSION: Status post image guided biopsy of left supraclavicular pathologic lymph node, as well as right IJ port catheter. Signed, Dulcy Fanny. Nadene Rubins, RPVI [facility] Radiology Electronically Signed   By: Corrie Mckusick D.O.   On: 11/05/2022 16:41   IR US Guide Bx Asp/Drain  Result Date: 11/05/2022 INDICATION: 88 year old male with lung carcinoma referred for port catheter and biopsy of left supraclavicular lymph node EXAM: IMPLANTED PORT A CATH PLACEMENT WITH ULTRASOUND AND FLUOROSCOPIC GUIDANCE IMAGE GUIDED BIOPSY OF LEFT  SUPRACLAVICULAR LYMPH NODE MEDICATIONS: None ANESTHESIA/SEDATION: Moderate (conscious) sedation was employed during this procedure. A total of Versed 2.0 mg and Fentanyl 100 mcg was administered intravenously by the radiology nurse. Total intra-service moderate Sedation Time: 38 minutes. The patient's level of consciousness and vital signs were monitored continuously by radiology nursing throughout the procedure under my direct supervision. FLUOROSCOPY: Radiation Exposure Index (as provided by the fluoroscopic device): 1 mGy Kerma COMPLICATIONS: None PROCEDURE: The procedure, risks, benefits, and alternatives were explained to the patient. Questions regarding the procedure were encouraged and answered. The patient understands and consents to the procedure. Ultrasound survey was performed of the left supraclavicular region with images stored and sent to PACs. The left neck was prepped with chlorhexidine in a sterile fashion, and a sterile drape was applied covering the operative field. A sterile gown and sterile gloves were used for the procedure. Local anesthesia was provided with 1% Lidocaine. Ultrasound guidance was used to infiltrate the region with 1% lidocaine for local anesthesia. Four separate 18 gauge core biopsy were then acquired of the pathologic left supraclavicular lymph node using ultrasound guidance. Images were stored. Tissue placed into fresh specimen. Final image was stored after biopsy. We then proceeded with port catheter. The patient was prepped and draped in the usual sterile fashion. 1% lidocaine was used for local anesthesia. Ultrasound survey was performed with images stored and sent to PACs. Right IJ vein documented to be patent. The right neck and chest was prepped with chlorhexidine, and draped in the usual sterile fashion using maximum barrier technique (cap and mask, sterile gown, sterile gloves, large sterile sheet, hand hygiene and cutaneous antiseptic). Local anesthesia was attained  by infiltration with 1% lidocaine without epinephrine. Ultrasound demonstrated patency of the right internal jugular vein, and this was documented with an image. Under real-time ultrasound guidance, this vein was accessed with a 21 gauge micropuncture needle and image documentation was performed. A small dermatotomy was made at the access site with an 11 scalpel. A 0.018"  wire was advanced into the SVC and used to estimate the length of the internal catheter. The access needle exchanged for a 41F micropuncture vascular sheath. The 0.018" wire was then removed and a 0.035" wire advanced into the IVC. An appropriate location for the subcutaneous reservoir was selected below the clavicle and an incision was made through the skin and underlying soft tissues. The subcutaneous tissues were then dissected using a combination of blunt and sharp surgical technique and a pocket was formed. A single lumen power injectable portacatheter was then tunneled through the subcutaneous tissues from the pocket to the dermatotomy and the port reservoir placed within the subcutaneous pocket. The venous access site was then serially dilated and a peel away vascular sheath placed over the wire. The wire was removed and the port catheter advanced into position under fluoroscopic guidance. The catheter tip is positioned in the cavoatrial junction. This was documented with a spot image. The portacatheter was then tested and found to flush and aspirate well. The port was flushed with saline followed by 100 units/mL heparinized saline. The pocket was then closed in two layers using first subdermal inverted interrupted absorbable sutures followed by a running subcuticular suture. The epidermis was then sealed with Dermabond. The dermatotomy at the venous access site was also seal with Dermabond. Patient tolerated the procedure well and remained hemodynamically stable throughout. No complications encountered and no significant blood loss  encountered IMPRESSION: Status post image guided biopsy of left supraclavicular pathologic lymph node, as well as right IJ port catheter. Signed, Dulcy Fanny. Nadene Rubins, RPVI [facility] Radiology Electronically Signed   By: Corrie Mckusick D.O.   On: 11/05/2022 16:41   MR BRAIN W WO CONTRAST  Result Date: 10/28/2022 CLINICAL DATA:  History of colon and prostate cancer, metastatic workup. EXAM: MRI HEAD WITHOUT AND WITH CONTRAST TECHNIQUE: Multiplanar, multiecho pulse sequences of the brain and surrounding structures were obtained without and with intravenous contrast. CONTRAST:  85mL GADAVIST GADOBUTROL 1 MMOL/ML IV SOLN COMPARISON:  10/12/2021 FINDINGS: Brain: No enhancement or swelling to suggest metastatic disease. No incidental infarct, hemorrhage, hydrocephalus, or collection. Mild-to-moderate chronic small vessel ischemia in the cerebral white matter and pons. Mild for age cerebral volume loss Vascular: Normal flow voids and vascular enhancements Skull and upper cervical spine: Normal marrow signal Sinuses/Orbits: Bilateral cataract resection. No evidence of mass or inflammation IMPRESSION: Negative for metastatic disease.  No acute or reversible finding. Electronically Signed   By: Jorje Guild M.D.   On: 10/28/2022 09:06   CT CHEST ABDOMEN PELVIS W CONTRAST  Result Date: 10/26/2022 CLINICAL DATA:  Cough and congestion for 2 weeks. Now with hemoptysis. Patient also reports rectal bleeding. History of colon and prostate cancer. Status post prostatectomy. * Tracking Code: BO * EXAM: CT CHEST, ABDOMEN, AND PELVIS WITH CONTRAST TECHNIQUE: Multidetector CT imaging of the chest, abdomen and pelvis was performed following the standard protocol during bolus administration of intravenous contrast. RADIATION DOSE REDUCTION: This exam was performed according to the departmental dose-optimization program which includes automated exposure control, adjustment of  the mA and/or kV according to patient size and/or use of iterative reconstruction technique. CONTRAST:  57mL OMNIPAQUE IOHEXOL 300 MG/ML  SOLN COMPARISON:  CT chest, abdomen and pelvis 05/19/2021 FINDINGS: CT CHEST FINDINGS Cardiovascular: Aortic atherosclerosis. Normal heart size. No pericardial effusion. Mediastinum/Nodes: Thyroid gland, trachea, and esophagus are unremarkable. -left supraclavicular lymph node is new measuring 1.6 cm, image 7/2. -Subcarinal lymph node is enlarged measuring 1.9 cm,  image 31/2. -new left paratracheal lymph node measures 1.5 cm, image 26/2. -mass versus conglomerate adenopathy in the left hilum measures 2.7 by 5.8 by 5.5 cm, image 31/2 and coronal image 105/3. Lungs/Pleura: -Within the superior segment of the right lower lobe there is a spiculated nodule measuring 1.4 cm, image 64/5. New from previous exam. -Tiny solid nodule within the posterior right apex measures 5 mm, image 47/5. New from the prior exam No pleural fluid or airspace disease. No atelectasis or pneumothorax identified Musculoskeletal: Status post ACDF within lower cervical spine. No acute or suspicious osseous findings. CT ABDOMEN PELVIS FINDINGS Hepatobiliary: No suspicious liver abnormality. Gallstones are noted measuring up to 7 mm. No gallbladder wall inflammation or signs of bile duct dilatation. Pancreas: Unremarkable. No pancreatic ductal dilatation or surrounding inflammatory changes. Spleen: Normal in size without focal abnormality. Adrenals/Urinary Tract: Bilateral Bosniak class 1 cysts are identified. These measure up to 3.6 cm. No follow-up recommended. Slightly exophytic lesion arising off the posterior right kidney does not meet CT criteria for a benign cyst measuring 9 mm and 50 Hounsfield units, image 76/2. This may represent a small enhancing lesion or hemorrhagic/proteinaceous cyst. No hydronephrosis or nephrolithiasis. Urinary bladder is unremarkable. Stomach/Bowel: Stomach appears normal. The  appendix is visualized and appears within normal limits. No pathologic dilatation of the large or small bowel loops. Distal colonic diverticulosis without signs of acute diverticulitis. No obstructing mass noted. Vascular/Lymphatic: Aortic atherosclerosis. No enlarged abdominal or pelvic lymph nodes. Reproductive: Prostate gland is surgically absent. Penile prosthesis reservoir is noted within the right inguinal region. Other: No free fluid or fluid collections. Musculoskeletal: Stable sclerotic lesion within the right iliac bone which is favored to represent a benign bone island no suspicious bone lesions identified. No acute osseous findings. IMPRESSION: 1. There is a spiculated nodule within the superior segment of the right lower lobe which is new from previous exam and worrisome for primary bronchogenic carcinoma. 2. Enlarged left supraclavicular, left paratracheal subcarinal and left hilar lymph nodes compatible with metastatic adenopathy. 3. No signs of metastatic disease within the abdomen or pelvis. 4. Gallstones. 5. Slightly exophytic lesion arising off the posterior right kidney does not meet CT criteria for a benign cyst measuring 9 mm and 50 Hounsfield units. This may represent a small enhancing lesion or hemorrhagic/proteinaceous cyst. This does not require emergent attention. However, when the patient is clinically stable consider more definitive characterization with dedicated, outpatient renal protocol CT or MRI. Alternatively referral to urologist may be considered further management. 6. Status post prostatectomy. 7.  Aortic Atherosclerosis (ICD10-I70.0). Electronically Signed   By: Kerby Moors M.D.   On: 10/26/2022 11:35   DG Chest Port 1 View  Result Date: 10/26/2022 CLINICAL DATA:  Possible sepsis.  Cough and congestion for 2 weeks EXAM: PORTABLE CHEST 1 VIEW COMPARISON:  10/09/2021 FINDINGS: Numerous leads and wires project over the chest. Lower cervical spine fixation. Moderate right  hemidiaphragm elevation. Midline trachea. Normal heart size for level of inspiration. Atherosclerosis in the transverse aorta. Possible tiny left pleural effusion. No pneumothorax. No congestive failure. Subtle obscuration of the left hemidiaphragm. IMPRESSION: Possible tiny left pleural effusion and adjacent left base airspace disease which could represent atelectasis or early infection. If the patient can undergo PA and lateral radiographs, these should be considered. Aortic Atherosclerosis (ICD10-I70.0). Electronically Signed   By: Abigail Miyamoto M.D.   On: 10/26/2022 09:45

## 2022-11-13 NOTE — Patient Instructions (Addendum)
[place]  Discharge Instructions  You were seen and examined today by Dr. Delton Coombes.  Dr. Delton Coombes has reviewed your most recent PET scan and biopsy. You have been diagnosed with Stage IV Lung Cancer. It has spread beyond your lungs to your lymph nodes, bones and adrenal gland.  Because the cancer has spread beyond your lung, it cannot be cured but it can be controlled with treatment.  Dr. Delton Coombes has requested additional testing on your biopsy, known as Next Generation Sequencing (NGS) testing. This will help to identify targetable mutations present in your cancer and can help to drive treatment options.  Dr. Delton Coombes will refer you to Radiation Oncology to discuss radiation to your right shoulder where your pain is.  Dr. Delton Coombes will prescribe Compazine to be taken every 6 hours as needed for nausea.  Follow-up as scheduled.  Thank you for choosing [facility] to provide your oncology and hematology care.   To afford each patient quality time with our provider, please arrive at least 15 minutes before your scheduled appointment time. You may need to reschedule your appointment if you arrive late (10 or more minutes). Arriving late affects you and other patients whose appointments are after yours.  Also, if you miss three or more appointments without notifying the office, you may be dismissed from the clinic at the provider's discretion.    Again, thank you for choosing [facility].  Our hope is that these requests will decrease the amount of time that you wait before being seen by our physicians.   If you have a lab appointment with the [facility] please come in thru the Main Entrance and check in at the main information desk.           _____________________________________________________________  Should you have questions after your visit to [facility], please contact our office at [phone] and follow the prompts.  Our office hours are 8:00 a.m. to 4:30 p.m. Monday - Thursday and 8:00 a.m. to 2:30 p.m. Friday.  Please note that voicemails left after 4:00 p.m. may not be returned until the following business day.  We are closed weekends and all major holidays.  You do have access to a nurse 24-7, just call the main number to the clinic [phone] and do not press any options, hold on the line and a nurse will answer the phone.    For prescription refill requests, have your pharmacy contact our office and allow 72 hours.    Masks are optional in the cancer centers. If you would like for your care team to wear a mask while they are taking care of you, please let them know. You may have one support person who is at least 86 years old accompany you for your appointments.

## 2022-11-13 NOTE — Progress Notes (Signed)
B 12 injection given in left deltoid.  Site CDI and tolerated well.  Discharged home in stable condition via wc accompanied by daughter.

## 2022-11-15 DIAGNOSIS — R0781 Pleurodynia: Secondary | ICD-10-CM | POA: Diagnosis not present

## 2022-11-15 DIAGNOSIS — E119 Type 2 diabetes mellitus without complications: Secondary | ICD-10-CM | POA: Diagnosis not present

## 2022-11-15 DIAGNOSIS — G893 Neoplasm related pain (acute) (chronic): Secondary | ICD-10-CM | POA: Diagnosis not present

## 2022-11-15 DIAGNOSIS — Z510 Encounter for antineoplastic radiation therapy: Secondary | ICD-10-CM | POA: Diagnosis not present

## 2022-11-15 DIAGNOSIS — E278 Other specified disorders of adrenal gland: Secondary | ICD-10-CM | POA: Diagnosis not present

## 2022-11-15 DIAGNOSIS — C3480 Malignant neoplasm of overlapping sites of unspecified bronchus and lung: Secondary | ICD-10-CM | POA: Diagnosis not present

## 2022-11-15 DIAGNOSIS — R0789 Other chest pain: Secondary | ICD-10-CM | POA: Diagnosis not present

## 2022-11-15 DIAGNOSIS — I10 Essential (primary) hypertension: Secondary | ICD-10-CM | POA: Diagnosis not present

## 2022-11-15 DIAGNOSIS — C7951 Secondary malignant neoplasm of bone: Secondary | ICD-10-CM | POA: Diagnosis not present

## 2022-11-15 DIAGNOSIS — T50905A Adverse effect of unspecified drugs, medicaments and biological substances, initial encounter: Secondary | ICD-10-CM | POA: Diagnosis not present

## 2022-11-15 DIAGNOSIS — K5903 Drug induced constipation: Secondary | ICD-10-CM | POA: Diagnosis not present

## 2022-11-17 DIAGNOSIS — E1165 Type 2 diabetes mellitus with hyperglycemia: Secondary | ICD-10-CM | POA: Diagnosis not present

## 2022-11-17 LAB — CULTURE, BLOOD (ROUTINE X 2): Culture: NO GROWTH

## 2022-11-18 ENCOUNTER — Other Ambulatory Visit: Payer: Self-pay | Admitting: *Deleted

## 2022-11-18 ENCOUNTER — Encounter: Payer: Self-pay | Admitting: *Deleted

## 2022-11-18 DIAGNOSIS — K5903 Drug induced constipation: Secondary | ICD-10-CM | POA: Diagnosis not present

## 2022-11-18 DIAGNOSIS — T50905A Adverse effect of unspecified drugs, medicaments and biological substances, initial encounter: Secondary | ICD-10-CM | POA: Diagnosis not present

## 2022-11-18 DIAGNOSIS — C3492 Malignant neoplasm of unspecified part of left bronchus or lung: Secondary | ICD-10-CM

## 2022-11-18 DIAGNOSIS — M25511 Pain in right shoulder: Secondary | ICD-10-CM

## 2022-11-18 DIAGNOSIS — Z510 Encounter for antineoplastic radiation therapy: Secondary | ICD-10-CM | POA: Diagnosis not present

## 2022-11-18 DIAGNOSIS — C7951 Secondary malignant neoplasm of bone: Secondary | ICD-10-CM | POA: Diagnosis not present

## 2022-11-18 DIAGNOSIS — E119 Type 2 diabetes mellitus without complications: Secondary | ICD-10-CM | POA: Diagnosis not present

## 2022-11-18 DIAGNOSIS — R0602 Shortness of breath: Secondary | ICD-10-CM

## 2022-11-18 DIAGNOSIS — G893 Neoplasm related pain (acute) (chronic): Secondary | ICD-10-CM | POA: Diagnosis not present

## 2022-11-18 DIAGNOSIS — C8310 Mantle cell lymphoma, unspecified site: Secondary | ICD-10-CM

## 2022-11-18 DIAGNOSIS — R2689 Other abnormalities of gait and mobility: Secondary | ICD-10-CM

## 2022-11-18 DIAGNOSIS — R0781 Pleurodynia: Secondary | ICD-10-CM | POA: Diagnosis not present

## 2022-11-18 DIAGNOSIS — R0789 Other chest pain: Secondary | ICD-10-CM | POA: Diagnosis not present

## 2022-11-18 DIAGNOSIS — I10 Essential (primary) hypertension: Secondary | ICD-10-CM | POA: Diagnosis not present

## 2022-11-18 DIAGNOSIS — E278 Other specified disorders of adrenal gland: Secondary | ICD-10-CM | POA: Diagnosis not present

## 2022-11-18 DIAGNOSIS — C3480 Malignant neoplasm of overlapping sites of unspecified bronchus and lung: Secondary | ICD-10-CM | POA: Diagnosis not present

## 2022-11-18 LAB — CULTURE, BLOOD (ROUTINE X 2)

## 2022-11-18 NOTE — Progress Notes (Signed)
Referral for [place], Nurse and PT accepted by [facility].  Family made aware.

## 2022-11-19 ENCOUNTER — Encounter: Payer: Self-pay | Admitting: *Deleted

## 2022-11-19 ENCOUNTER — Other Ambulatory Visit: Payer: Self-pay | Admitting: *Deleted

## 2022-11-19 ENCOUNTER — Telehealth (HOSPITAL_BASED_OUTPATIENT_CLINIC_OR_DEPARTMENT_OTHER): Payer: Self-pay | Admitting: *Deleted

## 2022-11-19 DIAGNOSIS — E1151 Type 2 diabetes mellitus with diabetic peripheral angiopathy without gangrene: Secondary | ICD-10-CM | POA: Diagnosis not present

## 2022-11-19 DIAGNOSIS — E119 Type 2 diabetes mellitus without complications: Secondary | ICD-10-CM | POA: Diagnosis not present

## 2022-11-19 DIAGNOSIS — I2510 Atherosclerotic heart disease of native coronary artery without angina pectoris: Secondary | ICD-10-CM | POA: Diagnosis not present

## 2022-11-19 DIAGNOSIS — G4733 Obstructive sleep apnea (adult) (pediatric): Secondary | ICD-10-CM | POA: Diagnosis not present

## 2022-11-19 DIAGNOSIS — M5412 Radiculopathy, cervical region: Secondary | ICD-10-CM | POA: Diagnosis not present

## 2022-11-19 DIAGNOSIS — K219 Gastro-esophageal reflux disease without esophagitis: Secondary | ICD-10-CM | POA: Diagnosis not present

## 2022-11-19 DIAGNOSIS — Z8572 Personal history of non-Hodgkin lymphomas: Secondary | ICD-10-CM | POA: Diagnosis not present

## 2022-11-19 DIAGNOSIS — I700 Atherosclerosis of aorta: Secondary | ICD-10-CM | POA: Diagnosis not present

## 2022-11-19 DIAGNOSIS — G893 Neoplasm related pain (acute) (chronic): Secondary | ICD-10-CM | POA: Diagnosis not present

## 2022-11-19 DIAGNOSIS — T50905A Adverse effect of unspecified drugs, medicaments and biological substances, initial encounter: Secondary | ICD-10-CM | POA: Diagnosis not present

## 2022-11-19 DIAGNOSIS — E538 Deficiency of other specified B group vitamins: Secondary | ICD-10-CM | POA: Diagnosis not present

## 2022-11-19 DIAGNOSIS — N390 Urinary tract infection, site not specified: Secondary | ICD-10-CM | POA: Diagnosis not present

## 2022-11-19 DIAGNOSIS — Z794 Long term (current) use of insulin: Secondary | ICD-10-CM | POA: Diagnosis not present

## 2022-11-19 DIAGNOSIS — C3492 Malignant neoplasm of unspecified part of left bronchus or lung: Secondary | ICD-10-CM | POA: Diagnosis not present

## 2022-11-19 DIAGNOSIS — C3480 Malignant neoplasm of overlapping sites of unspecified bronchus and lung: Secondary | ICD-10-CM | POA: Diagnosis not present

## 2022-11-19 DIAGNOSIS — Z7951 Long term (current) use of inhaled steroids: Secondary | ICD-10-CM | POA: Diagnosis not present

## 2022-11-19 DIAGNOSIS — E876 Hypokalemia: Secondary | ICD-10-CM | POA: Diagnosis not present

## 2022-11-19 DIAGNOSIS — R0781 Pleurodynia: Secondary | ICD-10-CM | POA: Diagnosis not present

## 2022-11-19 DIAGNOSIS — E1122 Type 2 diabetes mellitus with diabetic chronic kidney disease: Secondary | ICD-10-CM | POA: Diagnosis not present

## 2022-11-19 DIAGNOSIS — C7951 Secondary malignant neoplasm of bone: Secondary | ICD-10-CM | POA: Diagnosis not present

## 2022-11-19 DIAGNOSIS — E782 Mixed hyperlipidemia: Secondary | ICD-10-CM | POA: Diagnosis not present

## 2022-11-19 DIAGNOSIS — E1142 Type 2 diabetes mellitus with diabetic polyneuropathy: Secondary | ICD-10-CM | POA: Diagnosis not present

## 2022-11-19 DIAGNOSIS — Z9981 Dependence on supplemental oxygen: Secondary | ICD-10-CM | POA: Diagnosis not present

## 2022-11-19 DIAGNOSIS — N1832 Chronic kidney disease, stage 3b: Secondary | ICD-10-CM | POA: Diagnosis not present

## 2022-11-19 DIAGNOSIS — K5903 Drug induced constipation: Secondary | ICD-10-CM | POA: Diagnosis not present

## 2022-11-19 DIAGNOSIS — R0789 Other chest pain: Secondary | ICD-10-CM | POA: Diagnosis not present

## 2022-11-19 DIAGNOSIS — I10 Essential (primary) hypertension: Secondary | ICD-10-CM | POA: Diagnosis not present

## 2022-11-19 DIAGNOSIS — C7970 Secondary malignant neoplasm of unspecified adrenal gland: Secondary | ICD-10-CM | POA: Diagnosis not present

## 2022-11-19 DIAGNOSIS — Z510 Encounter for antineoplastic radiation therapy: Secondary | ICD-10-CM | POA: Diagnosis not present

## 2022-11-19 DIAGNOSIS — E278 Other specified disorders of adrenal gland: Secondary | ICD-10-CM | POA: Diagnosis not present

## 2022-11-19 MED ORDER — DRONABINOL 5 MG PO CAPS: 5.0000 mg | ORAL_CAPSULE | Freq: Two times a day (BID) | ORAL | 0 refills | Status: DC

## 2022-11-19 NOTE — Telephone Encounter (Signed)
Post ED Visit - Positive Culture Follow-up  Culture report reviewed by antimicrobial stewardship pharmacist: [place] Team '[]'$  Elenor Quinones, Pharm.D. '[]'$  Heide Guile, Pharm.D., BCPS AQ-ID '[]'$  Parks Neptune, Pharm.D., BCPS '[]'$  Alycia Rossetti, Pharm.D., BCPS '[]'$  [place], Pharm.D., BCPS, AAHIVP '[]'$  Legrand Como, Pharm.D., BCPS, AAHIVP '[]'$  Salome Arnt, PharmD, BCPS '[]'$  Johnnette Gourd, PharmD, BCPS '[]'$  Hughes Better, PharmD, BCPS '[]'$  Leeroy Cha, PharmD '[]'$  Laqueta Linden, PharmD, BCPS '[]'$  Albertina Parr, PharmD  [place] Team '[]'$  Leodis Sias, PharmD '[]'$  Lindell Spar, PharmD '[]'$  Royetta Asal, PharmD '[]'$  Graylin Shiver, Rph '[]'$  Rema Fendt) Glennon Mac, PharmD '[]'$  Arlyn Dunning, PharmD '[]'$  Netta Cedars, PharmD '[]'$  Dia Sitter, PharmD '[]'$  Leone Haven, PharmD '[]'$  Gretta Arab, PharmD '[]'$  Theodis Shove, PharmD '[]'$  Peggyann Juba, PharmD '[]'$  Reuel Boom, PharmD   Positive blood culture Likely contaminanat and no further patient follow-up is required at this time. DS Jeanell Sparrow, MD  Harlon Flor Talley 11/19/2022, 5:07 PM

## 2022-11-19 NOTE — Progress Notes (Signed)
Received approval from [facility] for Dronabinol 5 mg capsules from 11/19/22-09/23/23.  Pharmacy notified.

## 2022-11-20 DIAGNOSIS — R0789 Other chest pain: Secondary | ICD-10-CM | POA: Diagnosis not present

## 2022-11-20 DIAGNOSIS — E119 Type 2 diabetes mellitus without complications: Secondary | ICD-10-CM | POA: Diagnosis not present

## 2022-11-20 DIAGNOSIS — G893 Neoplasm related pain (acute) (chronic): Secondary | ICD-10-CM | POA: Diagnosis not present

## 2022-11-20 DIAGNOSIS — Z510 Encounter for antineoplastic radiation therapy: Secondary | ICD-10-CM | POA: Diagnosis not present

## 2022-11-20 DIAGNOSIS — C7951 Secondary malignant neoplasm of bone: Secondary | ICD-10-CM | POA: Diagnosis not present

## 2022-11-20 DIAGNOSIS — E278 Other specified disorders of adrenal gland: Secondary | ICD-10-CM | POA: Diagnosis not present

## 2022-11-20 DIAGNOSIS — K5903 Drug induced constipation: Secondary | ICD-10-CM | POA: Diagnosis not present

## 2022-11-20 DIAGNOSIS — T50905A Adverse effect of unspecified drugs, medicaments and biological substances, initial encounter: Secondary | ICD-10-CM | POA: Diagnosis not present

## 2022-11-20 DIAGNOSIS — R0781 Pleurodynia: Secondary | ICD-10-CM | POA: Diagnosis not present

## 2022-11-20 DIAGNOSIS — C3480 Malignant neoplasm of overlapping sites of unspecified bronchus and lung: Secondary | ICD-10-CM | POA: Diagnosis not present

## 2022-11-20 DIAGNOSIS — I10 Essential (primary) hypertension: Secondary | ICD-10-CM | POA: Diagnosis not present

## 2022-11-21 DIAGNOSIS — K5903 Drug induced constipation: Secondary | ICD-10-CM | POA: Diagnosis not present

## 2022-11-21 DIAGNOSIS — T50905A Adverse effect of unspecified drugs, medicaments and biological substances, initial encounter: Secondary | ICD-10-CM | POA: Diagnosis not present

## 2022-11-21 DIAGNOSIS — R0781 Pleurodynia: Secondary | ICD-10-CM | POA: Diagnosis not present

## 2022-11-21 DIAGNOSIS — R0789 Other chest pain: Secondary | ICD-10-CM | POA: Diagnosis not present

## 2022-11-21 DIAGNOSIS — C3480 Malignant neoplasm of overlapping sites of unspecified bronchus and lung: Secondary | ICD-10-CM | POA: Diagnosis not present

## 2022-11-21 DIAGNOSIS — G893 Neoplasm related pain (acute) (chronic): Secondary | ICD-10-CM | POA: Diagnosis not present

## 2022-11-21 DIAGNOSIS — C7951 Secondary malignant neoplasm of bone: Secondary | ICD-10-CM | POA: Diagnosis not present

## 2022-11-21 DIAGNOSIS — E278 Other specified disorders of adrenal gland: Secondary | ICD-10-CM | POA: Diagnosis not present

## 2022-11-21 DIAGNOSIS — I10 Essential (primary) hypertension: Secondary | ICD-10-CM | POA: Diagnosis not present

## 2022-11-21 DIAGNOSIS — E119 Type 2 diabetes mellitus without complications: Secondary | ICD-10-CM | POA: Diagnosis not present

## 2022-11-21 DIAGNOSIS — Z510 Encounter for antineoplastic radiation therapy: Secondary | ICD-10-CM | POA: Diagnosis not present

## 2022-11-22 DIAGNOSIS — I10 Essential (primary) hypertension: Secondary | ICD-10-CM | POA: Diagnosis not present

## 2022-11-22 DIAGNOSIS — I700 Atherosclerosis of aorta: Secondary | ICD-10-CM | POA: Diagnosis not present

## 2022-11-22 DIAGNOSIS — R0781 Pleurodynia: Secondary | ICD-10-CM | POA: Diagnosis not present

## 2022-11-22 DIAGNOSIS — C7951 Secondary malignant neoplasm of bone: Secondary | ICD-10-CM | POA: Diagnosis not present

## 2022-11-22 DIAGNOSIS — Z9981 Dependence on supplemental oxygen: Secondary | ICD-10-CM | POA: Diagnosis not present

## 2022-11-22 DIAGNOSIS — N1832 Chronic kidney disease, stage 3b: Secondary | ICD-10-CM | POA: Diagnosis not present

## 2022-11-22 DIAGNOSIS — I2510 Atherosclerotic heart disease of native coronary artery without angina pectoris: Secondary | ICD-10-CM | POA: Diagnosis not present

## 2022-11-22 DIAGNOSIS — T50905A Adverse effect of unspecified drugs, medicaments and biological substances, initial encounter: Secondary | ICD-10-CM | POA: Diagnosis not present

## 2022-11-22 DIAGNOSIS — N390 Urinary tract infection, site not specified: Secondary | ICD-10-CM | POA: Diagnosis not present

## 2022-11-22 DIAGNOSIS — K219 Gastro-esophageal reflux disease without esophagitis: Secondary | ICD-10-CM | POA: Diagnosis not present

## 2022-11-22 DIAGNOSIS — E538 Deficiency of other specified B group vitamins: Secondary | ICD-10-CM | POA: Diagnosis not present

## 2022-11-22 DIAGNOSIS — C3492 Malignant neoplasm of unspecified part of left bronchus or lung: Secondary | ICD-10-CM | POA: Diagnosis not present

## 2022-11-22 DIAGNOSIS — E1151 Type 2 diabetes mellitus with diabetic peripheral angiopathy without gangrene: Secondary | ICD-10-CM | POA: Diagnosis not present

## 2022-11-22 DIAGNOSIS — E119 Type 2 diabetes mellitus without complications: Secondary | ICD-10-CM | POA: Diagnosis not present

## 2022-11-22 DIAGNOSIS — E278 Other specified disorders of adrenal gland: Secondary | ICD-10-CM | POA: Diagnosis not present

## 2022-11-22 DIAGNOSIS — C3480 Malignant neoplasm of overlapping sites of unspecified bronchus and lung: Secondary | ICD-10-CM | POA: Diagnosis not present

## 2022-11-22 DIAGNOSIS — Z7951 Long term (current) use of inhaled steroids: Secondary | ICD-10-CM | POA: Diagnosis not present

## 2022-11-22 DIAGNOSIS — Z510 Encounter for antineoplastic radiation therapy: Secondary | ICD-10-CM | POA: Diagnosis not present

## 2022-11-22 DIAGNOSIS — K5903 Drug induced constipation: Secondary | ICD-10-CM | POA: Diagnosis not present

## 2022-11-22 DIAGNOSIS — C7970 Secondary malignant neoplasm of unspecified adrenal gland: Secondary | ICD-10-CM | POA: Diagnosis not present

## 2022-11-22 DIAGNOSIS — M5412 Radiculopathy, cervical region: Secondary | ICD-10-CM | POA: Diagnosis not present

## 2022-11-22 DIAGNOSIS — Z794 Long term (current) use of insulin: Secondary | ICD-10-CM | POA: Diagnosis not present

## 2022-11-22 DIAGNOSIS — G4733 Obstructive sleep apnea (adult) (pediatric): Secondary | ICD-10-CM | POA: Diagnosis not present

## 2022-11-22 DIAGNOSIS — R0789 Other chest pain: Secondary | ICD-10-CM | POA: Diagnosis not present

## 2022-11-22 DIAGNOSIS — E1142 Type 2 diabetes mellitus with diabetic polyneuropathy: Secondary | ICD-10-CM | POA: Diagnosis not present

## 2022-11-22 DIAGNOSIS — Z8572 Personal history of non-Hodgkin lymphomas: Secondary | ICD-10-CM | POA: Diagnosis not present

## 2022-11-22 DIAGNOSIS — E1122 Type 2 diabetes mellitus with diabetic chronic kidney disease: Secondary | ICD-10-CM | POA: Diagnosis not present

## 2022-11-22 DIAGNOSIS — E876 Hypokalemia: Secondary | ICD-10-CM | POA: Diagnosis not present

## 2022-11-22 DIAGNOSIS — E782 Mixed hyperlipidemia: Secondary | ICD-10-CM | POA: Diagnosis not present

## 2022-11-22 DIAGNOSIS — G893 Neoplasm related pain (acute) (chronic): Secondary | ICD-10-CM | POA: Diagnosis not present

## 2022-11-25 ENCOUNTER — Other Ambulatory Visit: Payer: Self-pay | Admitting: *Deleted

## 2022-11-25 DIAGNOSIS — C3480 Malignant neoplasm of overlapping sites of unspecified bronchus and lung: Secondary | ICD-10-CM | POA: Diagnosis not present

## 2022-11-25 DIAGNOSIS — R0781 Pleurodynia: Secondary | ICD-10-CM | POA: Diagnosis not present

## 2022-11-25 DIAGNOSIS — I10 Essential (primary) hypertension: Secondary | ICD-10-CM | POA: Diagnosis not present

## 2022-11-25 DIAGNOSIS — G893 Neoplasm related pain (acute) (chronic): Secondary | ICD-10-CM | POA: Diagnosis not present

## 2022-11-25 DIAGNOSIS — K5903 Drug induced constipation: Secondary | ICD-10-CM | POA: Diagnosis not present

## 2022-11-25 DIAGNOSIS — E119 Type 2 diabetes mellitus without complications: Secondary | ICD-10-CM | POA: Diagnosis not present

## 2022-11-25 DIAGNOSIS — R0789 Other chest pain: Secondary | ICD-10-CM | POA: Diagnosis not present

## 2022-11-25 DIAGNOSIS — E278 Other specified disorders of adrenal gland: Secondary | ICD-10-CM | POA: Diagnosis not present

## 2022-11-25 DIAGNOSIS — T50905A Adverse effect of unspecified drugs, medicaments and biological substances, initial encounter: Secondary | ICD-10-CM | POA: Diagnosis not present

## 2022-11-25 DIAGNOSIS — C7951 Secondary malignant neoplasm of bone: Secondary | ICD-10-CM | POA: Diagnosis not present

## 2022-11-25 DIAGNOSIS — Z510 Encounter for antineoplastic radiation therapy: Secondary | ICD-10-CM | POA: Diagnosis not present

## 2022-11-25 MED ORDER — METOCLOPRAMIDE HCL 10 MG PO TABS: 10.0000 mg | ORAL_TABLET | Freq: Four times a day (QID) | ORAL | 3 refills | Status: DC | PRN

## 2022-11-25 MED ORDER — MEGESTROL ACETATE 400 MG/10ML PO SUSP: 400.0000 mg | Freq: Two times a day (BID) | ORAL | 0 refills | Status: DC

## 2022-11-25 NOTE — Telephone Encounter (Signed)
Please advise 

## 2022-11-29 DIAGNOSIS — C3492 Malignant neoplasm of unspecified part of left bronchus or lung: Secondary | ICD-10-CM | POA: Diagnosis not present

## 2022-11-29 DIAGNOSIS — C7951 Secondary malignant neoplasm of bone: Secondary | ICD-10-CM | POA: Diagnosis not present

## 2022-11-29 DIAGNOSIS — E1151 Type 2 diabetes mellitus with diabetic peripheral angiopathy without gangrene: Secondary | ICD-10-CM | POA: Diagnosis not present

## 2022-11-29 DIAGNOSIS — N1832 Chronic kidney disease, stage 3b: Secondary | ICD-10-CM | POA: Diagnosis not present

## 2022-11-29 DIAGNOSIS — Z8572 Personal history of non-Hodgkin lymphomas: Secondary | ICD-10-CM | POA: Diagnosis not present

## 2022-11-29 DIAGNOSIS — I700 Atherosclerosis of aorta: Secondary | ICD-10-CM | POA: Diagnosis not present

## 2022-11-29 DIAGNOSIS — K219 Gastro-esophageal reflux disease without esophagitis: Secondary | ICD-10-CM | POA: Diagnosis not present

## 2022-11-29 DIAGNOSIS — E876 Hypokalemia: Secondary | ICD-10-CM | POA: Diagnosis not present

## 2022-11-29 DIAGNOSIS — M5412 Radiculopathy, cervical region: Secondary | ICD-10-CM | POA: Diagnosis not present

## 2022-11-29 DIAGNOSIS — E782 Mixed hyperlipidemia: Secondary | ICD-10-CM | POA: Diagnosis not present

## 2022-11-29 DIAGNOSIS — E1142 Type 2 diabetes mellitus with diabetic polyneuropathy: Secondary | ICD-10-CM | POA: Diagnosis not present

## 2022-11-29 DIAGNOSIS — Z794 Long term (current) use of insulin: Secondary | ICD-10-CM | POA: Diagnosis not present

## 2022-11-29 DIAGNOSIS — E1122 Type 2 diabetes mellitus with diabetic chronic kidney disease: Secondary | ICD-10-CM | POA: Diagnosis not present

## 2022-11-29 DIAGNOSIS — N390 Urinary tract infection, site not specified: Secondary | ICD-10-CM | POA: Diagnosis not present

## 2022-11-29 DIAGNOSIS — Z9981 Dependence on supplemental oxygen: Secondary | ICD-10-CM | POA: Diagnosis not present

## 2022-11-29 DIAGNOSIS — G4733 Obstructive sleep apnea (adult) (pediatric): Secondary | ICD-10-CM | POA: Diagnosis not present

## 2022-11-29 DIAGNOSIS — I10 Essential (primary) hypertension: Secondary | ICD-10-CM | POA: Diagnosis not present

## 2022-11-29 DIAGNOSIS — E538 Deficiency of other specified B group vitamins: Secondary | ICD-10-CM | POA: Diagnosis not present

## 2022-11-29 DIAGNOSIS — Z7951 Long term (current) use of inhaled steroids: Secondary | ICD-10-CM | POA: Diagnosis not present

## 2022-11-29 DIAGNOSIS — C7970 Secondary malignant neoplasm of unspecified adrenal gland: Secondary | ICD-10-CM | POA: Diagnosis not present

## 2022-11-29 DIAGNOSIS — I2510 Atherosclerotic heart disease of native coronary artery without angina pectoris: Secondary | ICD-10-CM | POA: Diagnosis not present

## 2022-11-30 ENCOUNTER — Inpatient Hospital Stay (HOSPITAL_COMMUNITY)
Admission: EM | Admit: 2022-11-30 | Discharge: 2022-12-03 | DRG: 698 | Disposition: A | Discharge status: Home / Self Care | Payer: Medicare Other | Attending: Internal Medicine | Admitting: Internal Medicine

## 2022-11-30 ENCOUNTER — Emergency Department (HOSPITAL_COMMUNITY): Payer: Medicare Other

## 2022-11-30 ENCOUNTER — Other Ambulatory Visit: Payer: Self-pay

## 2022-11-30 ENCOUNTER — Encounter (HOSPITAL_COMMUNITY): Payer: Self-pay | Admitting: *Deleted

## 2022-11-30 DIAGNOSIS — C3492 Malignant neoplasm of unspecified part of left bronchus or lung: Secondary | ICD-10-CM | POA: Diagnosis present

## 2022-11-30 DIAGNOSIS — D630 Anemia in neoplastic disease: Secondary | ICD-10-CM | POA: Diagnosis present

## 2022-11-30 DIAGNOSIS — D696 Thrombocytopenia, unspecified: Secondary | ICD-10-CM | POA: Diagnosis not present

## 2022-11-30 DIAGNOSIS — K219 Gastro-esophageal reflux disease without esophagitis: Secondary | ICD-10-CM | POA: Diagnosis present

## 2022-11-30 DIAGNOSIS — A419 Sepsis, unspecified organism: Secondary | ICD-10-CM | POA: Diagnosis present

## 2022-11-30 DIAGNOSIS — D72819 Decreased white blood cell count, unspecified: Secondary | ICD-10-CM | POA: Diagnosis present

## 2022-11-30 DIAGNOSIS — Z800 Family history of malignant neoplasm of digestive organs: Secondary | ICD-10-CM

## 2022-11-30 DIAGNOSIS — G4733 Obstructive sleep apnea (adult) (pediatric): Secondary | ICD-10-CM | POA: Diagnosis present

## 2022-11-30 DIAGNOSIS — Z1152 Encounter for screening for COVID-19: Secondary | ICD-10-CM

## 2022-11-30 DIAGNOSIS — C7951 Secondary malignant neoplasm of bone: Secondary | ICD-10-CM | POA: Diagnosis not present

## 2022-11-30 DIAGNOSIS — I129 Hypertensive chronic kidney disease with stage 1 through stage 4 chronic kidney disease, or unspecified chronic kidney disease: Secondary | ICD-10-CM | POA: Diagnosis not present

## 2022-11-30 DIAGNOSIS — Z9079 Acquired absence of other genital organ(s): Secondary | ICD-10-CM

## 2022-11-30 DIAGNOSIS — I2510 Atherosclerotic heart disease of native coronary artery without angina pectoris: Secondary | ICD-10-CM | POA: Diagnosis not present

## 2022-11-30 DIAGNOSIS — Z833 Family history of diabetes mellitus: Secondary | ICD-10-CM

## 2022-11-30 DIAGNOSIS — R339 Retention of urine, unspecified: Secondary | ICD-10-CM | POA: Diagnosis not present

## 2022-11-30 DIAGNOSIS — E782 Mixed hyperlipidemia: Secondary | ICD-10-CM | POA: Diagnosis present

## 2022-11-30 DIAGNOSIS — N390 Urinary tract infection, site not specified: Secondary | ICD-10-CM | POA: Diagnosis not present

## 2022-11-30 DIAGNOSIS — N3000 Acute cystitis without hematuria: Secondary | ICD-10-CM | POA: Diagnosis present

## 2022-11-30 DIAGNOSIS — Y732 Prosthetic and other implants, materials and accessory gastroenterology and urology devices associated with adverse incidents: Secondary | ICD-10-CM | POA: Diagnosis present

## 2022-11-30 DIAGNOSIS — E871 Hypo-osmolality and hyponatremia: Secondary | ICD-10-CM | POA: Diagnosis present

## 2022-11-30 DIAGNOSIS — E1165 Type 2 diabetes mellitus with hyperglycemia: Secondary | ICD-10-CM

## 2022-11-30 DIAGNOSIS — R739 Hyperglycemia, unspecified: Secondary | ICD-10-CM

## 2022-11-30 DIAGNOSIS — E1169 Type 2 diabetes mellitus with other specified complication: Secondary | ICD-10-CM | POA: Diagnosis present

## 2022-11-30 DIAGNOSIS — I10 Essential (primary) hypertension: Secondary | ICD-10-CM | POA: Diagnosis present

## 2022-11-30 DIAGNOSIS — Z91040 Latex allergy status: Secondary | ICD-10-CM

## 2022-11-30 DIAGNOSIS — Z7951 Long term (current) use of inhaled steroids: Secondary | ICD-10-CM

## 2022-11-30 DIAGNOSIS — R6520 Severe sepsis without septic shock: Secondary | ICD-10-CM | POA: Diagnosis not present

## 2022-11-30 DIAGNOSIS — C8319 Mantle cell lymphoma, extranodal and solid organ sites: Secondary | ICD-10-CM | POA: Diagnosis present

## 2022-11-30 DIAGNOSIS — Z794 Long term (current) use of insulin: Secondary | ICD-10-CM | POA: Diagnosis not present

## 2022-11-30 DIAGNOSIS — N1831 Chronic kidney disease, stage 3a: Secondary | ICD-10-CM | POA: Diagnosis present

## 2022-11-30 DIAGNOSIS — E1151 Type 2 diabetes mellitus with diabetic peripheral angiopathy without gangrene: Secondary | ICD-10-CM | POA: Diagnosis present

## 2022-11-30 DIAGNOSIS — E1142 Type 2 diabetes mellitus with diabetic polyneuropathy: Secondary | ICD-10-CM | POA: Diagnosis present

## 2022-11-30 DIAGNOSIS — Z7984 Long term (current) use of oral hypoglycemic drugs: Secondary | ICD-10-CM

## 2022-11-30 DIAGNOSIS — C7972 Secondary malignant neoplasm of left adrenal gland: Secondary | ICD-10-CM | POA: Diagnosis present

## 2022-11-30 DIAGNOSIS — Z79899 Other long term (current) drug therapy: Secondary | ICD-10-CM

## 2022-11-30 DIAGNOSIS — Z9221 Personal history of antineoplastic chemotherapy: Secondary | ICD-10-CM

## 2022-11-30 DIAGNOSIS — Z888 Allergy status to other drugs, medicaments and biological substances status: Secondary | ICD-10-CM

## 2022-11-30 DIAGNOSIS — R0902 Hypoxemia: Secondary | ICD-10-CM | POA: Diagnosis not present

## 2022-11-30 DIAGNOSIS — Z8546 Personal history of malignant neoplasm of prostate: Secondary | ICD-10-CM

## 2022-11-30 DIAGNOSIS — G9341 Metabolic encephalopathy: Secondary | ICD-10-CM | POA: Diagnosis present

## 2022-11-30 DIAGNOSIS — E1122 Type 2 diabetes mellitus with diabetic chronic kidney disease: Secondary | ICD-10-CM

## 2022-11-30 DIAGNOSIS — C7971 Secondary malignant neoplasm of right adrenal gland: Secondary | ICD-10-CM | POA: Diagnosis present

## 2022-11-30 DIAGNOSIS — Z91030 Bee allergy status: Secondary | ICD-10-CM

## 2022-11-30 DIAGNOSIS — A4159 Other Gram-negative sepsis: Secondary | ICD-10-CM | POA: Diagnosis present

## 2022-11-30 DIAGNOSIS — R000 Tachycardia, unspecified: Secondary | ICD-10-CM | POA: Diagnosis not present

## 2022-11-30 DIAGNOSIS — C799 Secondary malignant neoplasm of unspecified site: Secondary | ICD-10-CM

## 2022-11-30 DIAGNOSIS — R338 Other retention of urine: Secondary | ICD-10-CM | POA: Diagnosis not present

## 2022-11-30 DIAGNOSIS — T83591A Infection and inflammatory reaction due to implanted urinary sphincter, initial encounter: Secondary | ICD-10-CM | POA: Diagnosis not present

## 2022-11-30 DIAGNOSIS — R0602 Shortness of breath: Secondary | ICD-10-CM | POA: Diagnosis not present

## 2022-11-30 DIAGNOSIS — C8310 Mantle cell lymphoma, unspecified site: Secondary | ICD-10-CM | POA: Diagnosis present

## 2022-11-30 DIAGNOSIS — G3184 Mild cognitive impairment, so stated: Secondary | ICD-10-CM | POA: Diagnosis present

## 2022-11-30 DIAGNOSIS — Z885 Allergy status to narcotic agent status: Secondary | ICD-10-CM

## 2022-11-30 DIAGNOSIS — C61 Malignant neoplasm of prostate: Secondary | ICD-10-CM | POA: Diagnosis present

## 2022-11-30 DIAGNOSIS — R918 Other nonspecific abnormal finding of lung field: Secondary | ICD-10-CM | POA: Diagnosis not present

## 2022-11-30 LAB — URINALYSIS, ROUTINE W REFLEX MICROSCOPIC
Bilirubin Urine: NEGATIVE
Glucose, UA: >=500 mg/dL — AB
Ketones, ur: NEGATIVE mg/dL
Nitrite: POSITIVE — AB
Protein, ur: NEGATIVE mg/dL
Specific Gravity, Urine: 1.009 (ref 1.005–1.030)
WBC, UA: >50 WBC/hpf (ref 0–5)
pH: 6 (ref 5.0–8.0)

## 2022-11-30 LAB — CBC WITH DIFFERENTIAL/PLATELET
Abs Immature Granulocytes: 0.02 10*3/uL (ref 0.00–0.07)
Basophils Absolute: 0 10*3/uL (ref 0.0–0.1)
Basophils Relative: 1 %
Eosinophils Absolute: 0 10*3/uL (ref 0.0–0.5)
Eosinophils Relative: 1 %
HCT: 27.5 % — ABNORMAL LOW (ref 39.0–52.0)
Hemoglobin: 8.9 g/dL — ABNORMAL LOW (ref 13.0–17.0)
Immature Granulocytes: 1 %
Lymphocytes Relative: 14 %
Lymphs Abs: 0.5 10*3/uL — ABNORMAL LOW (ref 0.7–4.0)
MCH: 29.2 pg (ref 26.0–34.0)
MCHC: 32.4 g/dL (ref 30.0–36.0)
MCV: 90.2 fL (ref 80.0–100.0)
Monocytes Absolute: 0.3 10*3/uL (ref 0.1–1.0)
Monocytes Relative: 8 %
Neutro Abs: 2.7 10*3/uL (ref 1.7–7.7)
Neutrophils Relative %: 75 %
Platelets: 113 10*3/uL — ABNORMAL LOW (ref 150–400)
RBC: 3.05 MIL/uL — ABNORMAL LOW (ref 4.22–5.81)
RDW: 17.2 % — ABNORMAL HIGH (ref 11.5–15.5)
WBC: 3.6 10*3/uL — ABNORMAL LOW (ref 4.0–10.5)
nRBC: 0 % (ref 0.0–0.2)

## 2022-11-30 LAB — APTT: aPTT: 34 seconds (ref 24–36)

## 2022-11-30 LAB — RESP PANEL BY RT-PCR (RSV, FLU A&B, COVID)  RVPGX2
Influenza A by PCR: NEGATIVE
Influenza B by PCR: NEGATIVE
Resp Syncytial Virus by PCR: NEGATIVE
SARS Coronavirus 2 by RT PCR: NEGATIVE

## 2022-11-30 LAB — COMPREHENSIVE METABOLIC PANEL
ALT: 17 U/L (ref 0–44)
AST: 16 U/L (ref 15–41)
Albumin: 3.4 g/dL — ABNORMAL LOW (ref 3.5–5.0)
Alkaline Phosphatase: 90 U/L (ref 38–126)
Anion gap: 14 (ref 5–15)
BUN: 35 mg/dL — ABNORMAL HIGH (ref 8–23)
CO2: 23 mmol/L (ref 22–32)
Calcium: 8.8 mg/dL — ABNORMAL LOW (ref 8.9–10.3)
Chloride: 89 mmol/L — ABNORMAL LOW (ref 98–111)
Creatinine, Ser: 1.42 mg/dL — ABNORMAL HIGH (ref 0.61–1.24)
GFR, Estimated: 48 mL/min — ABNORMAL LOW (ref >60)
Glucose, Bld: 549 mg/dL (ref 70–99)
Potassium: 4 mmol/L (ref 3.5–5.1)
Sodium: 126 mmol/L — ABNORMAL LOW (ref 135–145)
Total Bilirubin: 1 mg/dL (ref 0.3–1.2)
Total Protein: 6.8 g/dL (ref 6.5–8.1)

## 2022-11-30 LAB — GLUCOSE, CAPILLARY
Glucose-Capillary: 340 mg/dL — ABNORMAL HIGH (ref 70–99)
Glucose-Capillary: 321 mg/dL — ABNORMAL HIGH (ref 70–99)

## 2022-11-30 LAB — LACTIC ACID, PLASMA
Lactic Acid, Venous: 1.5 mmol/L (ref 0.5–1.9)
Lactic Acid, Venous: 1.3 mmol/L (ref 0.5–1.9)

## 2022-11-30 LAB — PROTIME-INR
INR: 1.1 (ref 0.8–1.2)
Prothrombin Time: 14.2 seconds (ref 11.4–15.2)

## 2022-11-30 MED ORDER — ACETAMINOPHEN 325 MG PO TABS
650.0000 mg | ORAL_TABLET | Freq: Four times a day (QID) | ORAL | Status: DC | PRN
  Administered 2022-12-01: 650 mg via ORAL
  Filled 2022-11-30: qty 2

## 2022-11-30 MED ORDER — AMLODIPINE BESYLATE 10 MG PO TABS
5.0000 mg | ORAL_TABLET | Freq: Every day | ORAL | Status: DC
  Administered 2022-12-03: 5 mg via ORAL
  Filled 2022-11-30 (×5): qty 1

## 2022-11-30 MED ORDER — SODIUM CHLORIDE 0.9 % IV SOLN
2.0000 g | Freq: Two times a day (BID) | INTRAVENOUS | Status: DC
  Administered 2022-11-30 – 2022-12-03 (×6): 2 g via INTRAVENOUS
  Filled 2022-11-30 (×6): qty 12.5

## 2022-11-30 MED ORDER — SODIUM CHLORIDE 0.9 % IV SOLN
2.0000 g | Freq: Once | INTRAVENOUS | Status: AC
  Administered 2022-11-30: 2 g via INTRAVENOUS
  Filled 2022-11-30: qty 12.5

## 2022-11-30 MED ORDER — VANCOMYCIN HCL 2000 MG/400ML IV SOLN
2000.0000 mg | Freq: Once | INTRAVENOUS | Status: AC
  Administered 2022-11-30: 2000 mg via INTRAVENOUS
  Filled 2022-11-30: qty 400

## 2022-11-30 MED ORDER — HYDROMORPHONE HCL 1 MG/ML IJ SOLN
0.5000 mg | Freq: Once | INTRAMUSCULAR | Status: AC
  Administered 2022-11-30: 0.5 mg via INTRAVENOUS
  Filled 2022-11-30: qty 0.5

## 2022-11-30 MED ORDER — HALOPERIDOL LACTATE 5 MG/ML IJ SOLN: 2.0000 mg | Freq: Four times a day (QID) | INTRAMUSCULAR | Status: DC | PRN

## 2022-11-30 MED ORDER — ONDANSETRON HCL 4 MG PO TABS: 4.0000 mg | ORAL_TABLET | Freq: Four times a day (QID) | ORAL | Status: DC | PRN

## 2022-11-30 MED ORDER — INSULIN ASPART 100 UNIT/ML IJ SOLN
0.0000 [IU] | Freq: Three times a day (TID) | INTRAMUSCULAR | Status: DC
  Administered 2022-11-30: 3 [IU] via SUBCUTANEOUS
  Administered 2022-12-01: 20 [IU] via SUBCUTANEOUS

## 2022-11-30 MED ORDER — LACTATED RINGERS IV SOLN: INTRAVENOUS | Status: DC

## 2022-11-30 MED ORDER — SODIUM CHLORIDE 0.9 % IV BOLUS: 1000.0000 mL | Freq: Once | INTRAVENOUS | Status: DC

## 2022-11-30 MED ORDER — PROPRANOLOL HCL ER 80 MG PO CP24
80.0000 mg | ORAL_CAPSULE | Freq: Every day | ORAL | Status: DC
  Filled 2022-11-30 (×2): qty 1

## 2022-11-30 MED ORDER — ATORVASTATIN CALCIUM 40 MG PO TABS
40.0000 mg | ORAL_TABLET | Freq: Every day | ORAL | Status: DC
  Administered 2022-11-30 – 2022-12-03 (×4): 40 mg via ORAL
  Filled 2022-11-30 (×4): qty 1

## 2022-11-30 MED ORDER — SODIUM CHLORIDE 0.9 % IV SOLN: INTRAVENOUS | Status: DC

## 2022-11-30 MED ORDER — DRONABINOL 5 MG PO CAPS
5.0000 mg | ORAL_CAPSULE | Freq: Two times a day (BID) | ORAL | Status: DC
  Administered 2022-12-01 – 2022-12-03 (×5): 5 mg via ORAL
  Filled 2022-11-30 (×5): qty 1

## 2022-11-30 MED ORDER — ALBUTEROL SULFATE HFA 108 (90 BASE) MCG/ACT IN AERS: 2.0000 | INHALATION_SPRAY | Freq: Every evening | RESPIRATORY_TRACT | Status: DC | PRN

## 2022-11-30 MED ORDER — ACETAMINOPHEN 650 MG RE SUPP: 650.0000 mg | Freq: Four times a day (QID) | RECTAL | Status: DC | PRN

## 2022-11-30 MED ORDER — INSULIN ASPART 100 UNIT/ML IV SOLN
10.0000 [IU] | Freq: Once | INTRAVENOUS | Status: AC
  Administered 2022-11-30: 10 [IU] via INTRAVENOUS

## 2022-11-30 MED ORDER — SODIUM CHLORIDE 0.9% FLUSH: 3.0000 mL | INTRAVENOUS | Status: DC | PRN

## 2022-11-30 MED ORDER — SODIUM CHLORIDE 0.9 % IV SOLN: 250.0000 mL | INTRAVENOUS | Status: DC | PRN

## 2022-11-30 MED ORDER — VANCOMYCIN HCL 1250 MG/250ML IV SOLN: 1250.0000 mg | INTRAVENOUS | Status: DC

## 2022-11-30 MED ORDER — MEGESTROL ACETATE 400 MG/10ML PO SUSP
400.0000 mg | Freq: Two times a day (BID) | ORAL | Status: DC
  Administered 2022-11-30 – 2022-12-03 (×6): 400 mg via ORAL
  Filled 2022-11-30 (×6): qty 10

## 2022-11-30 MED ORDER — ASPIRIN 81 MG PO TBEC
81.0000 mg | DELAYED_RELEASE_TABLET | Freq: Every day | ORAL | Status: DC
  Administered 2022-11-30 – 2022-12-03 (×4): 81 mg via ORAL
  Filled 2022-11-30 (×4): qty 1

## 2022-11-30 MED ORDER — INSULIN GLARGINE-YFGN 100 UNIT/ML ~~LOC~~ SOLN
20.0000 [IU] | Freq: Every day | SUBCUTANEOUS | Status: DC
  Administered 2022-12-01 – 2022-12-02 (×2): 20 [IU] via SUBCUTANEOUS
  Filled 2022-11-30 (×4): qty 0.2

## 2022-11-30 MED ORDER — INSULIN ASPART 100 UNIT/ML IJ SOLN
0.0000 [IU] | Freq: Every day | INTRAMUSCULAR | Status: DC
  Administered 2022-11-30: 4 [IU] via SUBCUTANEOUS

## 2022-11-30 MED ORDER — SODIUM CHLORIDE 0.9 % IV SOLN: 1.0000 g | Freq: Once | INTRAVENOUS | Status: DC

## 2022-11-30 MED ORDER — INSULIN ASPART 100 UNIT/ML IJ SOLN
3.0000 [IU] | Freq: Three times a day (TID) | INTRAMUSCULAR | Status: DC
  Administered 2022-11-30 – 2022-12-01 (×2): 3 [IU] via SUBCUTANEOUS

## 2022-11-30 MED ORDER — FLUTICASONE PROPIONATE 50 MCG/ACT NA SUSP: 1.0000 | Freq: Every day | NASAL | Status: DC | PRN

## 2022-11-30 MED ORDER — ALBUTEROL SULFATE (2.5 MG/3ML) 0.083% IN NEBU: 2.5000 mg | INHALATION_SOLUTION | Freq: Every evening | RESPIRATORY_TRACT | Status: DC | PRN

## 2022-11-30 MED ORDER — ONDANSETRON HCL 4 MG/2ML IJ SOLN
4.0000 mg | Freq: Four times a day (QID) | INTRAMUSCULAR | Status: DC | PRN
  Administered 2022-12-01: 4 mg via INTRAVENOUS
  Filled 2022-11-30: qty 2

## 2022-11-30 MED ORDER — SODIUM CHLORIDE 0.9% FLUSH
3.0000 mL | Freq: Two times a day (BID) | INTRAVENOUS | Status: DC
  Administered 2022-12-01 – 2022-12-02 (×5): 3 mL via INTRAVENOUS

## 2022-11-30 MED ORDER — MECLIZINE HCL 25 MG PO TABS
25.0000 mg | ORAL_TABLET | Freq: Three times a day (TID) | ORAL | Status: DC | PRN
  Administered 2022-12-01 – 2022-12-02 (×2): 25 mg via ORAL
  Filled 2022-11-30 (×2): qty 1

## 2022-11-30 MED ORDER — HYDROMORPHONE HCL 2 MG PO TABS
2.0000 mg | ORAL_TABLET | Freq: Four times a day (QID) | ORAL | Status: DC | PRN
  Administered 2022-11-30: 2 mg via ORAL
  Filled 2022-11-30 (×2): qty 1

## 2022-11-30 MED ORDER — FOLIC ACID 1 MG PO TABS
1.0000 mg | ORAL_TABLET | Freq: Every day | ORAL | Status: DC
  Administered 2022-11-30 – 2022-12-03 (×4): 1 mg via ORAL
  Filled 2022-11-30 (×4): qty 1

## 2022-11-30 NOTE — Progress Notes (Signed)
HOSPITAL MEDICINE EVENT NOTE    Patient has arrived to [facility] [phone].  Patient has been able to intermittently urinate but seems to have no perception or ability to willingly do so.    I have discussed the case with Dr. Tresa Moore with Urology who graciously has agreed to come consult and see the patient this afternoon.  He will likely end up placing a foley catheter while we treat his infection.  His assistance is appreciated.  Continuing to montior closely.    Brandon Emerald  MD [facility]

## 2022-11-30 NOTE — H&P (Addendum)
History and Physical    [name] [id number] DOB: [birth year] DOA: 11/30/2022  PCP: Celene Squibb, MD   Patient coming from: Home  Chief Complaint: AMS/decreased urinary output  HPI: [name] is a 88 y.o. male with medical history significant for mantle cell lymphoma colon cancer, ulcerative colitis, hypertension, dyslipidemia, OSA, B12 deficiency, history of prostate cancer, CAD, GERD, CKD stage IIIa, dyslipidemia, peripheral vascular disease, type 2 diabetes with CKD, and artificial urethral sphincter placement at [facility] 05/2022 who presented to the ED with difficulty urinating as well as generalized weakness that has progressed over the last few days.  Apparently, he has had urinary tract infections in the last few weeks and was prescribed Bactrim twice a day for 5 days with no improvement in symptoms.  He denies any fevers or chills, or abdominal pain and was accompanied by his wife who states that he is overall felt quite ill and has been somewhat confused as well.  She believes that he has not been using his artificial urinary sphincter appropriately at home given his current clinical state.   ED Course: Vital signs are stable and patient is afebrile.  Urine with clear signs of UTI with cultures currently pending.  Creatinine 1.14 glucose 549 with hyponatremia noted.  Patient started on cefepime in the ED. he was given IV insulin due to his severe hyperglycemia.  Review of Systems: Reviewed as noted above, otherwise negative.  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, anomalous circumflex - no PCIs   Essential hypertension, benign    GERD (gastroesophageal reflux disease)    Mixed hyperlipidemia    Obstructive  sleep apnea    does not use   Prostate cancer ([place])    PVD (peripheral vascular disease) ([place])    Type 2 diabetes mellitus ([place])     Past Surgical History:  Procedure Laterality Date   BACK SURGERY     BALLOON DILATION N/A 08/27/2013   Procedure: BALLOON DILATION;  Surgeon: Rogene Houston, MD;  [facility];  Service: Endoscopy;  Laterality: N/A;   COLON SURGERY     COLONOSCOPY N/A 12/17/2013   Procedure: COLONOSCOPY;  Surgeon: Rogene Houston, MD;  [facility];  Service: Endoscopy;  Laterality: N/A;  940   COLONOSCOPY N/A 02/28/2014   Procedure: COLONOSCOPY;  Surgeon: Rogene Houston, MD;  [facility];  Service: Endoscopy;  Laterality: N/A;  730   COLONOSCOPY N/A 08/04/2015   Procedure: COLONOSCOPY;  Surgeon: Rogene Houston, MD;  [facility];  Service: Endoscopy;  Laterality: N/A;  200 - moved to 11/11 @ 2:10 - Ann to notify pt   COLONOSCOPY WITH ESOPHAGOGASTRODUODENOSCOPY (EGD) N/A 08/27/2013   Procedure: COLONOSCOPY WITH ESOPHAGOGASTRODUODENOSCOPY (EGD);  Surgeon: Rogene Houston, MD;  [facility];  Service: Endoscopy;  Laterality: N/A;  730   COLONOSCOPY WITH PROPOFOL N/A 01/02/2022   Procedure: COLONOSCOPY WITH PROPOFOL;  Surgeon: Rogene Houston, MD;  [facility];  Service: Endoscopy;  Laterality: N/A;  1205   CYSTOSCOPY N/A 06/09/2021   Procedure: CYSTOSCOPY;  Surgeon: Lucas Mallow, MD;  Location: [facility];  Service: Urology;  Laterality: N/A;   IR IMAGING GUIDED PORT  INSERTION  11/05/2022   IR REMOVAL TUN ACCESS W/ PORT W/O FL MOD SED  07/17/2018   IR US GUIDE BX ASP/DRAIN  11/05/2022   KNEE ARTHROSCOPY Left    KNEE SURGERY Right    total knee   MALONEY DILATION N/A 08/27/2013   Procedure: MALONEY DILATION;  Surgeon: Rogene Houston, MD;  [facility];  Service: Endoscopy;  Laterality: N/A;   POLYPECTOMY  01/02/2022   Procedure: POLYPECTOMY;  Surgeon: Rogene Houston, MD;  [facility];  Service: Endoscopy;;  cecal;ascending;proximal, sigmoid;rectal;   PORTACATH PLACEMENT Right 09/23/2012   PROSTATECTOMY     REMOVAL OF PENILE PROSTHESIS N/A 06/09/2021   Procedure: REMOVAL OF ARTIFICIAL URINARY SPHINCER; REMOVAL OF INFLATABLE PENILE PROSTHESIS;  Surgeon: Lucas Mallow, MD;  Location: [facility];  Service: Urology;  Laterality: N/A;   removal of port Right    SAVORY DILATION N/A 08/27/2013   Procedure: SAVORY DILATION;  Surgeon: Rogene Houston, MD;  [facility];  Service: Endoscopy;  Laterality: N/A;   SHOULDER SURGERY     TONSILLECTOMY     URINARY SPHINCTER IMPLANT N/A 01/23/2017   artificial urinary sphincter implant by Dr. Odis Luster   VASECTOMY       reports that he has never smoked. He has never used smokeless tobacco. He reports that he does not drink alcohol and does not use drugs.  Allergies  Allergen Reactions   Bee Venom Anaphylaxis   Iodine     Pt does not remember reaction  Other Reaction(s): GI Intolerance, Hypotension, Other (See Comments)  syncope   Adhesive [Tape] Hives   Ciprofloxacin     Pt does not remember reaction    Povidone-Iodine     Pt does not remember reaction    Simvastatin     Pt does not remember reaction    Codeine     Pt does not remember reaction  Other Reaction(s): GI Intolerance   Latex Rash    Family History  Problem Relation Age of Onset   Colon cancer Brother    Cancer Brother    Diabetes Father    Cancer Brother     Prior to Admission medications   Medication Sig Start Date End Date Taking? Authorizing Provider  acetaminophen (TYLENOL) 325 MG tablet Take 650 mg by mouth every 6 (six) hours as needed for mild pain or moderate pain.    [provider]  albuterol (PROAIR HFA) 108 (90 Base) MCG/ACT inhaler Inhale 2 puffs into the lungs at bedtime as needed for wheezing or shortness of breath. 05/12/20   Tanda Rockers, MD  amLODipine (NORVASC) 10 MG tablet Take 1 tablet (10 mg total) by  mouth daily. 01/11/22   Richardson Dopp T, PA-C  atorvastatin (LIPITOR) 40 MG tablet Take 40 mg by mouth daily. 10/05/21   [provider]  blood glucose meter kit and supplies KIT Dispense based on patient and insurance preference. Use at least three times a day to check BS levels and fluctuation. 09/04/21   Barton Dubois, MD  budesonide-formoterol [facility]) 160-4.5 MCG/ACT inhaler Inhale 2 puffs into the lungs 2 (two) times daily. 05/12/20   Tanda Rockers, MD  dextromethorphan-guaiFENesin (MUCINEX DM) 30-600 MG 12hr tablet Take 1 tablet by mouth 2 (two) times daily as needed for cough.    [provider]  dronabinol (MARINOL) 5 MG capsule Take 1 capsule (5 mg total) by mouth 2 (two) times daily before a meal. 11/19/22  Derek Jack, MD  fluticasone [facility]) 50 MCG/ACT nasal spray Place 1 spray into both nostrils as needed for allergies or rhinitis.    [provider]  folic acid (FOLVITE) 1 MG tablet Take 1 tablet (1 mg total) by mouth daily. 11/13/22   Derek Jack, MD  insulin aspart (NOVOLOG) 100 UNIT/ML injection Inject 8-10 Units into the skin 3 (three) times daily before meals. Per sliding scale    [provider]  LORazepam (ATIVAN) 0.5 MG tablet Take 0.5 mg by mouth at bedtime as needed for sleep.    [provider]  meclizine (ANTIVERT) 25 MG tablet Take 1 tablet (25 mg total) by mouth 3 (three) times daily as needed for dizziness. 10/15/21   Kathie Dike, MD  megestrol (MEGACE) 400 MG/10ML suspension Take 10 mLs (400 mg total) by mouth 2 (two) times daily. 11/25/22   Derek Jack, MD  metFORMIN (GLUCOPHAGE-XR) 750 MG 24 hr tablet Take 750 mg by mouth daily. 11/06/21   [provider]  metoCLOPramide (REGLAN) 10 MG tablet Take 1 tablet (10 mg total) by mouth every 6 (six) hours as needed for nausea. 11/25/22   Derek Jack, MD  pantoprazole (PROTONIX) 40 MG tablet Take 40 mg by mouth 2 (two) times daily.  11/08/22   [provider]  prochlorperazine (COMPAZINE) 10 MG tablet Take 1 tablet (10 mg total) by mouth every 6 (six) hours as needed for nausea or vomiting. 11/13/22   Derek Jack, MD  propranolol ER (INDERAL LA) 80 MG 24 hr capsule Take 1 capsule (80 mg total) by mouth at bedtime. 10/15/21   Kathie Dike, MD  sulfamethoxazole-trimethoprim (BACTRIM DS) 800-160 MG tablet Take 1 tablet by mouth 2 (two) times daily. 11/13/22   Derek Jack, MD  testosterone cypionate (DEPOTESTOSTERONE CYPIONATE) 200 MG/ML injection Inject 200 mg into the muscle every 14 (fourteen) days.  04/29/16   [provider]  TOUJEO SOLOSTAR 300 UNIT/ML Solostar Pen Inject 35 Units into the skin daily. 10/29/22   Murlean Iba, MD    Physical Exam: Vitals:   11/30/22 1200 11/30/22 1207 11/30/22 1300 11/30/22 1319  BP: 136/80  127/65   Pulse:  95 (!) 110   Resp: (!) '22 18 20   '$ Temp:    98.2 F (36.8 C)  TempSrc:    Oral  SpO2:  93% 94%   Weight:      Height:        Constitutional: NAD, calm, comfortable Vitals:   11/30/22 1200 11/30/22 1207 11/30/22 1300 11/30/22 1319  BP: 136/80  127/65   Pulse:  95 (!) 110   Resp: (!) '22 18 20   '$ Temp:    98.2 F (36.8 C)  TempSrc:    Oral  SpO2:  93% 94%   Weight:      Height:       Eyes: lids and conjunctivae normal Neck: normal, supple Respiratory: clear to auscultation bilaterally. Normal respiratory effort. No accessory muscle use.  Cardiovascular: Regular rate and rhythm, no murmurs. Abdomen: no tenderness, no distention. Bowel sounds positive.  Musculoskeletal:  No edema. Skin: no rashes, lesions, ulcers.  Psychiatric: Flat affect  Labs on Admission: I have personally reviewed following labs and imaging studies  CBC: Recent Labs  Lab 11/30/22 0744  WBC 3.6*  NEUTROABS 2.7  HGB 8.9*  HCT 27.5*  MCV 90.2  PLT 123456*   Basic Metabolic Panel: Recent Labs  Lab 11/30/22 0744  NA 126*  K 4.0  CL 89*  CO2  23   GLUCOSE 549*  BUN 35*  CREATININE 1.42*  CALCIUM 8.8*   GFR: Estimated Creatinine Clearance: 43.8 mL/min (A) (by C-G formula based on SCr of 1.42 mg/dL (H)). Liver Function Tests: Recent Labs  Lab 11/30/22 0744  AST 16  ALT 17  ALKPHOS 90  BILITOT 1.0  PROT 6.8  ALBUMIN 3.4*   No results for input(s): "LIPASE", "AMYLASE" in the last 168 hours. No results for input(s): "AMMONIA" in the last 168 hours. Coagulation Profile: Recent Labs  Lab 11/30/22 0744  INR 1.1   Cardiac Enzymes: No results for input(s): "CKTOTAL", "CKMB", "CKMBINDEX", "TROPONINI" in the last 168 hours. BNP (last 3 results) No results for input(s): "PROBNP" in the last 8760 hours. HbA1C: No results for input(s): "HGBA1C" in the last 72 hours. CBG: No results for input(s): "GLUCAP" in the last 168 hours. Lipid Profile: No results for input(s): "CHOL", "HDL", "LDLCALC", "TRIG", "CHOLHDL", "LDLDIRECT" in the last 72 hours. Thyroid Function Tests: No results for input(s): "TSH", "T4TOTAL", "FREET4", "T3FREE", "THYROIDAB" in the last 72 hours. Anemia Panel: No results for input(s): "VITAMINB12", "FOLATE", "FERRITIN", "TIBC", "IRON", "RETICCTPCT" in the last 72 hours. Urine analysis:    Component Value Date/Time   COLORURINE YELLOW 11/30/2022 0836   APPEARANCEUR CLOUDY (A) 11/30/2022 0836   LABSPEC 1.009 11/30/2022 0836   PHURINE 6.0 11/30/2022 0836   GLUCOSEU >=500 (A) 11/30/2022 0836   HGBUR SMALL (A) 11/30/2022 0836   BILIRUBINUR NEGATIVE 11/30/2022 0836   BILIRUBINUR negative 05/04/2021 1702   KETONESUR NEGATIVE 11/30/2022 0836   PROTEINUR NEGATIVE 11/30/2022 0836   UROBILINOGEN 1.0 05/04/2021 1702   UROBILINOGEN 0.2 06/30/2015 1552   NITRITE POSITIVE (A) 11/30/2022 0836   LEUKOCYTESUR LARGE (A) 11/30/2022 0836    Radiological Exams on Admission: DG Chest Port 1 View  Result Date: 11/30/2022 CLINICAL DATA:  Possible sepsis. EXAM: PORTABLE CHEST 1 VIEW COMPARISON:  11/12/2022 FINDINGS: RIGHT  power port tip overlies the superior vena cava. Low lung volume. Heart size is normal. There is patchy opacity in the LEFT mid lung zone and LEFT lung base. LEFT hilar mass is better seen on prior studies. RIGHT lung is clear. IMPRESSION: 1. Low lung volume. 2. LEFT mid lung zone and LEFT LOWER lobe atelectasis or minimal infiltrate. Electronically Signed   By: Nolon Nations M.D.   On: 11/30/2022 08:46    EKG: Independently reviewed. ST 108 bpm.  Assessment/Plan Principal Problem:   Sepsis ([place]) Active Problems:   Mixed hyperlipidemia   CAD (coronary artery disease)   OSA (obstructive sleep apnea)   Mantle cell lymphoma (HCC)   Prostate cancer (HCC)   Leukopenia   Thrombocytopenia, unspecified (HCC)   Mild cognitive impairment   Type 2 diabetes mellitus with chronic kidney disease, with long-term current use of insulin (HCC)   Diabetic peripheral neuropathy (HCC)   Acute metabolic encephalopathy    Sepsis, POA, secondary to UTI in the setting of artificial urinary sphincter -Prior artificial urethral sphincter placement 05/2022 at [facility]; follows with Dr. Natasha Mead there -Discussed with Dr. Tresa Moore, urology, who is willing to consult and assist with management of patient at [facility] until patient can be transferred to [facility] -Continue treatment empirically with vancomycin and Rocephin and check MRSA PCR -Blood and urine cultures ordered and pending -No significant lactic acidosis noted -Continue to monitor repeat labs  Hyperglycemia in the setting of type 2 diabetes -Given 10 units of IV insulin in the ED -Continue carb modified diet -Continue home insulin regimen as well as SSI -  Hold metformin for now  Hyponatremia -Likely pseudohyponatremia in the setting of hyperglycemia -Continue close monitoring  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 Delton Coombes in outpatient setting, patient continues to desire active  treatments  Prostate cancer -Previous laparoscopic prostatectomy 02/2008 -Last PSA 2018 undetectable  Mantle cell lymphoma of the colon -Follows with Dr. Delton Coombes  Chronic anemia in the setting of malignancy -Continue to monitor CBC -No overt bleeding noted  Thrombocytopenia -In the setting of malignancy/chemotherapy -Avoid heparin agents  CKD 3A -Baseline creatinine 1.3-1.6 -Continue to monitor closely  OSA -CPAP at night  PAD -Continue statin  Hypertension -Continue amlodipine  Dyslipidemia -Continue statin  GERD -Continue PPI  DVT prophylaxis: SCDs Code Status: Full Family Communication: Tried calling wife and daughter with no response 3/9 Disposition Plan: Transfer to [facility] for Urology evaluation/Tx of UTI Consults called:Urology-Dr. Tresa Moore Admission status: Inpatient, MedSurg  Severity of Illness: The appropriate patient status for this patient is INPATIENT. Inpatient status is judged to be reasonable and necessary in order to provide the required intensity of service to ensure the patient's safety. The patient's presenting symptoms, physical exam findings, and initial radiographic and laboratory data in the context of their chronic comorbidities is felt to place them at high risk for further clinical deterioration. Furthermore, it is not anticipated that the patient will be medically stable for discharge from the hospital within 2 midnights of admission.   * I certify that at the point of admission it is my clinical judgment that the patient will require inpatient hospital care spanning beyond 2 midnights from the point of admission due to high intensity of service, high risk for further deterioration and high frequency of surveillance required.*   Juanangel Soderholm D Emelda Kohlbeck DO [facility]  If 7PM-7AM, please contact night-coverage [email or web address]  11/30/2022, 1:27 PM

## 2022-11-30 NOTE — Consult Note (Cosign Needed)
Urology Consult   Physician requesting consult: Dr. Cyd Silence, MD  Reason for consult: Urinary retention  History of Present Illness: [name] is a 88 y.o. year old male with history of HTN, HLD, CAD, DM, mantle cell lymphoma of the colon s/p chemotherapy, metastatic lung cancer, prostate cancer s/p RALP in 2009 and XRT/ADT, and post prostatectomy incontinence s/p AUS placement in 2023. He presented to an OSH ED with difficulty urinating and generalized weakness. He has been treated for a UTI over the last few weeks without improvement in symptoms.   He is stable and afebrile. WBC 3.6, Hgb 8.9, Cr 1.42. UA with positive nitrite, large leukocyte, pyuria and rare bacteria. Urine culture in process. He is being treated with Vancomycin and Ceftriaxone.  He was transferred to [facility] long for further care and urologic evaluation due to no bed availability at his home urology institution's hospital.   He reports that he was voiding fine until 3 days ago when he developed intermittent gross hematuria and difficulty voiding. He reports that his sphincter was not functioning well and had a cystoscopy performed several months ago with his local urologist.   Per [facility] note by Dr. Natasha Mead, his AUS cuff did not coapt completely despite being cycled several times. Discussion at that time resulted with plan to observe his urinary incontinence.  Patient has been able to void some since his transfer but is not emptying well. His bladder scan is 430. Urine in bedside cannister clear yellow.   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, anomalous circumflex - no PCIs   Essential hypertension, benign    GERD (gastroesophageal reflux  disease)    Mixed hyperlipidemia    Obstructive sleep apnea    does not use   Prostate cancer ([place])    PVD (peripheral vascular disease) ([place])    Type 2 diabetes mellitus ([place])     Past Surgical History:  Procedure Laterality Date   BACK SURGERY     BALLOON DILATION N/A 08/27/2013   Procedure: BALLOON DILATION;  Surgeon: Rogene Houston, MD;  [facility];  Service: Endoscopy;  Laterality: N/A;   COLON SURGERY     COLONOSCOPY N/A 12/17/2013   Procedure: COLONOSCOPY;  Surgeon: Rogene Houston, MD;  [facility];  Service: Endoscopy;  Laterality: N/A;  940   COLONOSCOPY N/A 02/28/2014   Procedure: COLONOSCOPY;  Surgeon: Rogene Houston, MD;  [facility];  Service: Endoscopy;  Laterality: N/A;  730   COLONOSCOPY N/A 08/04/2015   Procedure: COLONOSCOPY;  Surgeon: Rogene Houston, MD;  [facility];  Service: Endoscopy;  Laterality: N/A;  200 - moved to 11/11 @ 2:10 - Ann to notify pt   COLONOSCOPY WITH ESOPHAGOGASTRODUODENOSCOPY (EGD) N/A 08/27/2013   Procedure: COLONOSCOPY WITH ESOPHAGOGASTRODUODENOSCOPY (EGD);  Surgeon: Rogene Houston, MD;  [facility];  Service: Endoscopy;  Laterality: N/A;  730   COLONOSCOPY WITH PROPOFOL N/A 01/02/2022   Procedure: COLONOSCOPY WITH PROPOFOL;  Surgeon: Rogene Houston, MD;  [facility];  Service: Endoscopy;  Laterality: N/A;  1205   CYSTOSCOPY N/A 06/09/2021   Procedure: CYSTOSCOPY;  Surgeon: Lucas Mallow, MD;  Location: [facility];  Service:  Urology;  Laterality: N/A;   IR IMAGING GUIDED PORT INSERTION  11/05/2022   IR REMOVAL TUN ACCESS W/ PORT W/O FL MOD SED  07/17/2018   IR US GUIDE BX ASP/DRAIN  11/05/2022   KNEE ARTHROSCOPY Left    KNEE SURGERY Right    total knee   MALONEY DILATION N/A 08/27/2013   Procedure: MALONEY DILATION;  Surgeon: Rogene Houston, MD;  [facility];  Service: Endoscopy;  Laterality: N/A;   POLYPECTOMY  01/02/2022   Procedure: POLYPECTOMY;   Surgeon: Rogene Houston, MD;  [facility];  Service: Endoscopy;;  cecal;ascending;proximal, sigmoid;rectal;   PORTACATH PLACEMENT Right 09/23/2012   PROSTATECTOMY     REMOVAL OF PENILE PROSTHESIS N/A 06/09/2021   Procedure: REMOVAL OF ARTIFICIAL URINARY SPHINCER; REMOVAL OF INFLATABLE PENILE PROSTHESIS;  Surgeon: Lucas Mallow, MD;  Location: [facility];  Service: Urology;  Laterality: N/A;   removal of port Right    SAVORY DILATION N/A 08/27/2013   Procedure: SAVORY DILATION;  Surgeon: Rogene Houston, MD;  [facility];  Service: Endoscopy;  Laterality: N/A;   SHOULDER SURGERY     TONSILLECTOMY     URINARY SPHINCTER IMPLANT N/A 01/23/2017   artificial urinary sphincter implant by Dr. Odis Luster   VASECTOMY      Current Hospital Medications:  Home Meds:  Current Meds  Medication Sig   acetaminophen (TYLENOL) 325 MG tablet Take 650 mg by mouth every 6 (six) hours as needed for mild pain or moderate pain.   albuterol (PROAIR HFA) 108 (90 Base) MCG/ACT inhaler Inhale 2 puffs into the lungs at bedtime as needed for wheezing or shortness of breath.   amLODipine (NORVASC) 10 MG tablet Take 1 tablet (10 mg total) by mouth daily. (Patient taking differently: Take 5 mg by mouth daily.)   aspirin EC 81 MG tablet Take by mouth.   atorvastatin (LIPITOR) 40 MG tablet Take 40 mg by mouth daily.   budesonide-formoterol (SYMBICORT) 160-4.5 MCG/ACT inhaler Inhale 2 puffs into the lungs 2 (two) times daily.   dextromethorphan-guaiFENesin (MUCINEX DM) 30-600 MG 12hr tablet Take 1 tablet by mouth 2 (two) times daily as needed for cough.   dronabinol (MARINOL) 5 MG capsule Take 1 capsule (5 mg total) by mouth 2 (two) times daily before a meal.   fluticasone (FLONASE) 50 MCG/ACT nasal spray Place 1 spray into both nostrils as needed for allergies or rhinitis.   folic acid (FOLVITE) 1 MG tablet Take 1 tablet (1 mg total) by mouth daily.   HYDROmorphone (DILAUDID) 2 MG tablet Take 2 mg  by mouth every 6 (six) hours as needed for moderate pain.   insulin aspart (NOVOLOG) 100 UNIT/ML injection Inject 8-10 Units into the skin 3 (three) times daily before meals. Per sliding scale   insulin lispro (HUMALOG KWIKPEN) 100 UNIT/ML KwikPen Inject into the skin.   LORazepam (ATIVAN) 0.5 MG tablet Take 0.5 mg by mouth at bedtime as needed for sleep.   meclizine (ANTIVERT) 25 MG tablet Take 1 tablet (25 mg total) by mouth 3 (three) times daily as needed for dizziness.   megestrol (MEGACE) 400 MG/10ML suspension Take 10 mLs (400 mg total) by mouth 2 (two) times daily.   metoCLOPramide (REGLAN) 10 MG tablet Take 1 tablet (10 mg total) by mouth every 6 (six) hours as needed for nausea.   nystatin (MYCOSTATIN/NYSTOP) powder Apply topically.   oxyCODONE-acetaminophen (PERCOCET/ROXICET) 5-325 MG tablet    pantoprazole (PROTONIX) 40 MG tablet Take 40 mg  by mouth 2 (two) times daily.   polyethylene glycol powder (GLYCOLAX/MIRALAX) 17 GM/SCOOP powder Take by mouth.   prochlorperazine (COMPAZINE) 10 MG tablet Take 1 tablet (10 mg total) by mouth every 6 (six) hours as needed for nausea or vomiting.   TOUJEO SOLOSTAR 300 UNIT/ML Solostar Pen Inject 35 Units into the skin daily.   Trospium Chloride 60 MG CP24     Scheduled Meds:  amLODipine  5 mg Oral Daily   aspirin EC  81 mg Oral Daily   atorvastatin  40 mg Oral Daily   dronabinol  5 mg Oral BID AC   folic acid  1 mg Oral Daily   insulin aspart  0-20 Units Subcutaneous TID WC   insulin aspart  0-5 Units Subcutaneous QHS   insulin aspart  3 Units Subcutaneous TID WC   insulin glargine-yfgn  20 Units Subcutaneous Daily   megestrol  400 mg Oral BID   propranolol ER  80 mg Oral QHS   sodium chloride flush  3 mL Intravenous Q12H   Continuous Infusions:  sodium chloride     ceFEPime (MAXIPIME) IV     [START ON 12/01/2022] vancomycin     vancomycin     PRN Meds:.sodium chloride, acetaminophen **OR** acetaminophen, albuterol, fluticasone,  haloperidol lactate, HYDROmorphone, meclizine, ondansetron **OR** ondansetron (ZOFRAN) IV, sodium chloride flush  Allergies:  Allergies  Allergen Reactions   Bee Venom Anaphylaxis   Iodine     Pt does not remember reaction  Other Reaction(s): GI Intolerance, Hypotension, Other (See Comments)  syncope   Adhesive [Tape] Hives   Ciprofloxacin     Pt does not remember reaction    Povidone-Iodine     Pt does not remember reaction    Simvastatin     Pt does not remember reaction    Codeine     Pt does not remember reaction  Other Reaction(s): GI Intolerance   Latex Rash    Family History  Problem Relation Age of Onset   Colon cancer Brother    Cancer Brother    Diabetes Father    Cancer Brother     Social History:  reports that he has never smoked. He has never used smokeless tobacco. He reports that he does not drink alcohol and does not use drugs.  ROS: A complete review of systems was performed.  All systems are negative except for pertinent findings as noted.  Physical Exam:  Vital signs in last 24 hours: Temp:  [97.7 F (36.5 C)-98.5 F (36.9 C)] 98.5 F (36.9 C) (03/09 1459) Pulse Rate:  [51-121] 51 (03/09 1459) Resp:  [16-26] 17 (03/09 1459) BP: (115-143)/(63-95) 121/63 (03/09 1459) SpO2:  [90 %-96 %] 93 % (03/09 1459) Weight:  [90.7 kg-91.5 kg] 91.5 kg (03/09 1459) Constitutional:  Alert and oriented, No acute distress Cardiovascular: Regular rate Respiratory: Normal respiratory effort on room air GI: Abdomen is soft, nontender, nondistended, no abdominal masses GU: Bedside cannister with clear yellow urine, condom catheter applied to penis. Patient declined assessment of AUS  Laboratory Data:  Recent Labs    11/30/22 0744  WBC 3.6*  HGB 8.9*  HCT 27.5*  PLT 113*    Recent Labs    11/30/22 0744  NA 126*  K 4.0  CL 89*  GLUCOSE 549*  BUN 35*  CALCIUM 8.8*  CREATININE 1.42*     Results for orders placed or performed during the hospital  encounter of 11/30/22 (from the past 24 hour(s))  Lactic acid, plasma  Status: None   Collection Time: 11/30/22  7:44 AM  Result Value Ref Range   Lactic Acid, Venous 1.5 0.5 - 1.9 mmol/L  Comprehensive metabolic panel     Status: Abnormal   Collection Time: 11/30/22  7:44 AM  Result Value Ref Range   Sodium 126 (L) 135 - 145 mmol/L   Potassium 4.0 3.5 - 5.1 mmol/L   Chloride 89 (L) 98 - 111 mmol/L   CO2 23 22 - 32 mmol/L   Glucose, Bld 549 (HH) 70 - 99 mg/dL   BUN 35 (H) 8 - 23 mg/dL   Creatinine, Ser 1.42 (H) 0.61 - 1.24 mg/dL   Calcium 8.8 (L) 8.9 - 10.3 mg/dL   Total Protein 6.8 6.5 - 8.1 g/dL   Albumin 3.4 (L) 3.5 - 5.0 g/dL   AST 16 15 - 41 U/L   ALT 17 0 - 44 U/L   Alkaline Phosphatase 90 38 - 126 U/L   Total Bilirubin 1.0 0.3 - 1.2 mg/dL   GFR, Estimated 48 (L) >60 mL/min   Anion gap 14 5 - 15  CBC with Differential     Status: Abnormal   Collection Time: 11/30/22  7:44 AM  Result Value Ref Range   WBC 3.6 (L) 4.0 - 10.5 K/uL   RBC 3.05 (L) 4.22 - 5.81 MIL/uL   Hemoglobin 8.9 (L) 13.0 - 17.0 g/dL   HCT 27.5 (L) 39.0 - 52.0 %   MCV 90.2 80.0 - 100.0 fL   MCH 29.2 26.0 - 34.0 pg   MCHC 32.4 30.0 - 36.0 g/dL   RDW 17.2 (H) 11.5 - 15.5 %   Platelets 113 (L) 150 - 400 K/uL   nRBC 0.0 0.0 - 0.2 %   Neutrophils Relative % 75 %   Neutro Abs 2.7 1.7 - 7.7 K/uL   Lymphocytes Relative 14 %   Lymphs Abs 0.5 (L) 0.7 - 4.0 K/uL   Monocytes Relative 8 %   Monocytes Absolute 0.3 0.1 - 1.0 K/uL   Eosinophils Relative 1 %   Eosinophils Absolute 0.0 0.0 - 0.5 K/uL   Basophils Relative 1 %   Basophils Absolute 0.0 0.0 - 0.1 K/uL   Immature Granulocytes 1 %   Abs Immature Granulocytes 0.02 0.00 - 0.07 K/uL  Protime-INR     Status: None   Collection Time: 11/30/22  7:44 AM  Result Value Ref Range   Prothrombin Time 14.2 11.4 - 15.2 seconds   INR 1.1 0.8 - 1.2  APTT     Status: None   Collection Time: 11/30/22  7:44 AM  Result Value Ref Range   aPTT 34 24 - 36 seconds   Blood Culture (routine x 2)     Status: None (Preliminary result)   Collection Time: 11/30/22  7:44 AM   Specimen: BLOOD LEFT WRIST  Result Value Ref Range   Specimen Description      BLOOD LEFT WRIST BOTTLES DRAWN AEROBIC AND ANAEROBIC   Special Requests      Blood Culture results may not be optimal due to an excessive volume of blood received in culture bottles Performed at [facility], [address], [place],  [address]    Culture PENDING    Report Status PENDING   Blood Culture (routine x 2)     Status: None (Preliminary result)   Collection Time: 11/30/22  7:49 AM   Specimen: BLOOD LEFT FOREARM  Result Value Ref Range   Specimen Description      BLOOD  LEFT FOREARM BOTTLES DRAWN AEROBIC AND ANAEROBIC   Special Requests      Blood Culture results may not be optimal due to an excessive volume of blood received in culture bottles Performed at [facility], [address] [place], [place] [address]    Culture PENDING    Report Status PENDING   Resp panel by RT-PCR (RSV, Flu A&B, Covid) Anterior Nasal Swab     Status: None   Collection Time: 11/30/22  8:26 AM   Specimen: Anterior Nasal Swab  Result Value Ref Range   SARS Coronavirus 2 by RT PCR NEGATIVE NEGATIVE   Influenza A by PCR NEGATIVE NEGATIVE   Influenza B by PCR NEGATIVE NEGATIVE   Resp Syncytial Virus by PCR NEGATIVE NEGATIVE  Urinalysis, Routine w reflex microscopic -Urine, Catheterized     Status: Abnormal   Collection Time: 11/30/22  8:36 AM  Result Value Ref Range   Color, Urine YELLOW YELLOW   APPearance CLOUDY (A) CLEAR   Specific Gravity, Urine 1.009 1.005 - 1.030   pH 6.0 5.0 - 8.0   Glucose, UA >=500 (A) NEGATIVE mg/dL   Hgb urine dipstick SMALL (A) NEGATIVE   Bilirubin Urine NEGATIVE NEGATIVE   Ketones, ur NEGATIVE NEGATIVE mg/dL   Protein, ur NEGATIVE NEGATIVE mg/dL   Nitrite POSITIVE (A) NEGATIVE   Leukocytes,Ua LARGE (A) NEGATIVE   RBC / HPF 0-5 0 - 5 RBC/hpf   WBC, UA >50 0 - 5 WBC/hpf    Bacteria, UA RARE (A) NONE SEEN   Squamous Epithelial / HPF 0-5 0 - 5 /HPF   WBC Clumps PRESENT    Budding Yeast PRESENT   Lactic acid, plasma     Status: None   Collection Time: 11/30/22 10:30 AM  Result Value Ref Range   Lactic Acid, Venous 1.3 0.5 - 1.9 mmol/L   Recent Results (from the past 240 hour(s))  Blood Culture (routine x 2)     Status: None (Preliminary result)   Collection Time: 11/30/22  7:44 AM   Specimen: BLOOD LEFT WRIST  Result Value Ref Range Status   Specimen Description   Final    BLOOD LEFT WRIST BOTTLES DRAWN AEROBIC AND ANAEROBIC   Special Requests   Final    Blood Culture results may not be optimal due to an excessive volume of blood received in culture bottles Performed at [facility], [address], [place], [place] [address]    Culture PENDING  Incomplete   Report Status PENDING  Incomplete  Blood Culture (routine x 2)     Status: None (Preliminary result)   Collection Time: 11/30/22  7:49 AM   Specimen: BLOOD LEFT FOREARM  Result Value Ref Range Status   Specimen Description   Final    BLOOD LEFT FOREARM BOTTLES DRAWN AEROBIC AND ANAEROBIC   Special Requests   Final    Blood Culture results may not be optimal due to an excessive volume of blood received in culture bottles Performed at [facility], [address] [place], [place] [address]    Culture PENDING  Incomplete   Report Status PENDING  Incomplete  Resp panel by RT-PCR (RSV, Flu A&B, Covid) Anterior Nasal Swab     Status: None   Collection Time: 11/30/22  8:26 AM   Specimen: Anterior Nasal Swab  Result Value Ref Range Status   SARS Coronavirus 2 by RT PCR NEGATIVE NEGATIVE Final    Comment: (NOTE) SARS-CoV-2 target nucleic acids are NOT DETECTED.  The SARS-CoV-2 RNA is  generally detectable in upper respiratory specimens during the acute phase of infection. The lowest concentration of SARS-CoV-2 viral copies this assay can detect is 138 copies/mL. A negative result does not preclude  SARS-Cov-2 infection and should not be used as the sole basis for treatment or other patient management decisions. A negative result may occur with  improper specimen collection/handling, submission of specimen other than nasopharyngeal swab, presence of viral mutation(s) within the areas targeted by this assay, and inadequate number of viral copies(<138 copies/mL). A negative result must be combined with clinical observations, patient history, and epidemiological information. The expected result is Negative.  Fact Sheet for Patients:  EntrepreneurPulse.com.au  Fact Sheet for Healthcare Providers:  IncredibleEmployment.be  This test is no t yet approved or cleared by the [place] FDA and  has been authorized for detection and/or diagnosis of SARS-CoV-2 by FDA under an Emergency Use Authorization (EUA). This EUA will remain  in effect (meaning this test can be used) for the duration of the COVID-19 declaration under Section 564(b)(1) of the Act, 21 U.S.C.section 360bbb-3(b)(1), unless the authorization is terminated  or revoked sooner.       Influenza A by PCR NEGATIVE NEGATIVE Final   Influenza B by PCR NEGATIVE NEGATIVE Final    Comment: (NOTE) The Xpert Xpress SARS-CoV-2/FLU/RSV plus assay is intended as an aid in the diagnosis of influenza from Nasopharyngeal swab specimens and should not be used as a sole basis for treatment. Nasal washings and aspirates are unacceptable for Xpert Xpress SARS-CoV-2/FLU/RSV testing.  Fact Sheet for Patients: EntrepreneurPulse.com.au  Fact Sheet for Healthcare Providers: IncredibleEmployment.be  This test is not yet approved or cleared by the [place] FDA and has been authorized for detection and/or diagnosis of SARS-CoV-2 by FDA under an Emergency Use Authorization (EUA). This EUA will remain in effect (meaning this test can be used) for the duration of  the COVID-19 declaration under Section 564(b)(1) of the Act, 21 U.S.C. section 360bbb-3(b)(1), unless the authorization is terminated or revoked.     Resp Syncytial Virus by PCR NEGATIVE NEGATIVE Final    Comment: (NOTE) Fact Sheet for Patients: EntrepreneurPulse.com.au  Fact Sheet for Healthcare Providers: IncredibleEmployment.be  This test is not yet approved or cleared by the [place] FDA and has been authorized for detection and/or diagnosis of SARS-CoV-2 by FDA under an Emergency Use Authorization (EUA). This EUA will remain in effect (meaning this test can be used) for the duration of the COVID-19 declaration under Section 564(b)(1) of the Act, 21 U.S.C. section 360bbb-3(b)(1), unless the authorization is terminated or revoked.  Performed at [facility], [address] [place], [place] [address]     Renal Function: Recent Labs    11/30/22 0744  CREATININE 1.42*   Estimated Creatinine Clearance: 44 mL/min (A) (by C-G formula based on SCr of 1.42 mg/dL (H)).  Radiologic Imaging: DG Chest Port 1 View  Result Date: 11/30/2022 CLINICAL DATA:  Possible sepsis. EXAM: PORTABLE CHEST 1 VIEW COMPARISON:  11/12/2022 FINDINGS: RIGHT power port tip overlies the superior vena cava. Low lung volume. Heart size is normal. There is patchy opacity in the LEFT mid lung zone and LEFT lung base. LEFT hilar mass is better seen on prior studies. RIGHT lung is clear. IMPRESSION: 1. Low lung volume. 2. LEFT mid lung zone and LEFT LOWER lobe atelectasis or minimal infiltrate. Electronically Signed   By: Nolon Nations M.D.   On: 11/30/2022 08:46    I independently reviewed the above imaging studies.  Impression/Recommendation 87  y.o. year old male with history of HTN, HLD, CAD, DM, mantle cell lymphoma of the colon s/p chemotherapy, metastatic lung cancer, prostate cancer s/p RALP in 2009 and XRT/ADT, and post prostatectomy incontinence s/p AUS  placement in 2023.   Admitted for UTI, urinary retention in setting of AUS.  Afebrile and stable. UA concerning for infection. Culture pending. Receiving broad spectrum IV antibiotics. Patient small volume voiding on arrival to hospital. Urine clear yellow. Bedside bladder scan with 430cc of urine.  Discussed ensuring AUS is deactivated and placing small caliber catheter for urinary drainage in setting of retention and UTI. Patient refused saying he would like a second opinion about catheter placement. Discussed with him risks of kidney injury due to continued retention as well as worsening infection if unable to drain urinary system appropriately. Despite lengthy conversation, he refused catheter placement and assessment of AUS device.   Recommend continued IV antibiotics and narrow appropriately based on culture data. He will need at least 14 day course total for complicated UTI. Would also recommend qshift bladder scan/PVR to evaluate patient's urinary retention.   Urology will sign off at this time as patient has declined care.  Please page consult pager with any questions/concerns.  Coralyn Pear, MD [facility] 11/30/2022, 3:44 PM    I have seen and examined the patient and agree with Dr. Juliann Pares' plan.  [place],  S: S/p prostatectomy 2009 at [facility] and then artificial urinary sphincter by Terlecki at [facility] now with urosepsis and partial retention. He has wax/wane mental status. [facility] refuess to accept patient or even allow MD discsusion as "hospital full" which seems frankly illegal. Also new widespread lung CA.   O: NAD, AOx 3 a present, frail  SNTND, prior scars w/o hernias. AUS pump and tubing palpable in scrotuem w/o induraiton, some dribbling urine per penis PVR 454m   A/P: WE recommended deactiving sphincter and placement of very small bore catheter for max GU drianage in setting of UTI. Pt adamantly refuses. We discussed risks of  infection progression and even mortality from sepsis. He voiced understanding. Fortunately he is not in florid reanl failure, is passing some urine, and his sphincter likely does not even fully coapt. Agree with max medical therapy with ABX. Should pt's clincal status deteriorate or he change his mindk, our recs remian.

## 2022-11-30 NOTE — Progress Notes (Addendum)
Pharmacy Antibiotic Note  [name] is a 88 y.o. male admitted on 11/30/2022 with sepsis/UTI.  Pharmacy has been consulted for cefepime dosing.  Plan: Vancomycin 2000 mg IV x 1 Vancomycin 1250 mg IV every 24 hours. Cefepime 2000 mg IV every 12 hours. Monitor labs, c/s, and vanco levels as indicated.  Height: '5\' 11"'$  (180.3 cm) Weight: 90.7 kg (200 lb) IBW/kg (Calculated) : 75.3  Temp (24hrs), Avg:97.7 F (36.5 C), Min:97.7 F (36.5 C), Max:97.7 F (36.5 C)  Recent Labs  Lab 11/30/22 0744  WBC 3.6*  CREATININE 1.42*  LATICACIDVEN 1.5    Estimated Creatinine Clearance: 43.8 mL/min (A) (by C-G formula based on SCr of 1.42 mg/dL (H)).    Allergies  Allergen Reactions   Bee Venom Anaphylaxis   Iodine     Pt does not remember reaction  Other Reaction(s): GI Intolerance, Hypotension, Other (See Comments)  syncope   Adhesive [Tape] Hives   Ciprofloxacin     Pt does not remember reaction    Povidone-Iodine     Pt does not remember reaction    Simvastatin     Pt does not remember reaction    Codeine     Pt does not remember reaction  Other Reaction(s): GI Intolerance   Latex Rash    Antimicrobials this admission: Cefepime 3/9 >> Vanco 3/9 >>  Microbiology results: 3/9 BCx: pending 3/9 UCx: pending    Thank you for allowing pharmacy to be a part of this patient's care.  Ramond Craver 11/30/2022 10:27 AM

## 2022-11-30 NOTE — ED Triage Notes (Addendum)
Pt c/o decreased urination over the last 2 days. Pt reports only urinating once daily in the morning. Wife reports she gave him 1 dose of her Lasix this morning "to help him urinate". Cancer pt. Pt also c/o difficulty breathing since yesterday.

## 2022-11-30 NOTE — Progress Notes (Signed)
[facility] following for sepsis protocol. 

## 2022-11-30 NOTE — ED Provider Notes (Signed)
[name]   CSN: [id number] Arrival date & time: 11/30/22  0725     History  Chief Complaint  Patient presents with   Decreased Urination    [name] is a 88 y.o. male.  HPI   This patient is a very pleasant 88 year old male who unfortunately has been diagnosed with metastatic lung cancer.  This gentleman has actually suffered through a combination of different cancers over time including prostate cancer status post prostatectomy, he is currently under the care of a radiation oncologist to help with the pain from his metastatic tumors in his right shoulder, right femur, multiple different bony metastases including ribs and sternal area as well.  He also has bilateral adrenal metastatic disease.  Unfortunately the patient has suffered with urinary tract infections over time, after reviewing the notes from the oncology clinic Dr. Delton Coombes had treated him with Bactrim twice a day for 5 days recently within the last few weeks, the patient took the medications and no he felt better over the last couple of days he has felt worse with difficulty urinating, urinary retention, feeling like he cannot get any urine out, generalized weakness and some shortness of breath.  He denies having a fever and denies abdominal pain except for some lower pelvic pain.  He is accompanied by his wife who gives additional history that he has been feeling poorly recently  I have reviewed the medical record and it shows that the patient does have an artificial urinary sphincter with a scrotal component, he has not been squeezing this to allow the sphincter to open.  The wife now states he has been confused for the last few days and has not been his normal self  Home Medications Prior to Admission medications   Medication Sig Start Date End Date Taking? Authorizing Provider  acetaminophen (TYLENOL) 325 MG tablet Take 650 mg by mouth every 6 (six)  hours as needed for mild pain or moderate pain.    [provider]  albuterol (PROAIR HFA) 108 (90 Base) MCG/ACT inhaler Inhale 2 puffs into the lungs at bedtime as needed for wheezing or shortness of breath. 05/12/20   Tanda Rockers, MD  amLODipine (NORVASC) 10 MG tablet Take 1 tablet (10 mg total) by mouth daily. 01/11/22   Richardson Dopp T, PA-C  atorvastatin (LIPITOR) 40 MG tablet Take 40 mg by mouth daily. 10/05/21   [provider]  blood glucose meter kit and supplies KIT Dispense based on patient and insurance preference. Use at least three times a day to check BS levels and fluctuation. 09/04/21   Barton Dubois, MD  budesonide-formoterol [facility]) 160-4.5 MCG/ACT inhaler Inhale 2 puffs into the lungs 2 (two) times daily. 05/12/20   Tanda Rockers, MD  dextromethorphan-guaiFENesin (MUCINEX DM) 30-600 MG 12hr tablet Take 1 tablet by mouth 2 (two) times daily as needed for cough.    [provider]  dronabinol (MARINOL) 5 MG capsule Take 1 capsule (5 mg total) by mouth 2 (two) times daily before a meal. 11/19/22   Derek Jack, MD  fluticasone (FLONASE) 50 MCG/ACT nasal spray Place 1 spray into both nostrils as needed for allergies or rhinitis.    [provider]  folic acid (FOLVITE) 1 MG tablet Take 1 tablet (1 mg total) by mouth daily. 11/13/22   Derek Jack, MD  insulin aspart (NOVOLOG) 100 UNIT/ML injection Inject 8-10 Units into the skin 3 (three) times daily before meals. Per  sliding scale    [provider]  LORazepam (ATIVAN) 0.5 MG tablet Take 0.5 mg by mouth at bedtime as needed for sleep.    [provider]  meclizine (ANTIVERT) 25 MG tablet Take 1 tablet (25 mg total) by mouth 3 (three) times daily as needed for dizziness. 10/15/21   Kathie Dike, MD  megestrol (MEGACE) 400 MG/10ML suspension Take 10 mLs (400 mg total) by mouth 2 (two) times daily. 11/25/22   Derek Jack, MD  metFORMIN (GLUCOPHAGE-XR) 750  MG 24 hr tablet Take 750 mg by mouth daily. 11/06/21   [provider]  metoCLOPramide (REGLAN) 10 MG tablet Take 1 tablet (10 mg total) by mouth every 6 (six) hours as needed for nausea. 11/25/22   Derek Jack, MD  pantoprazole (PROTONIX) 40 MG tablet Take 40 mg by mouth 2 (two) times daily. 11/08/22   [provider]  prochlorperazine (COMPAZINE) 10 MG tablet Take 1 tablet (10 mg total) by mouth every 6 (six) hours as needed for nausea or vomiting. 11/13/22   Derek Jack, MD  propranolol ER (INDERAL LA) 80 MG 24 hr capsule Take 1 capsule (80 mg total) by mouth at bedtime. 10/15/21   Kathie Dike, MD  sulfamethoxazole-trimethoprim (BACTRIM DS) 800-160 MG tablet Take 1 tablet by mouth 2 (two) times daily. 11/13/22   Derek Jack, MD  testosterone cypionate (DEPOTESTOSTERONE CYPIONATE) 200 MG/ML injection Inject 200 mg into the muscle every 14 (fourteen) days.  04/29/16   [provider]  TOUJEO SOLOSTAR 300 UNIT/ML Solostar Pen Inject 35 Units into the skin daily. 10/29/22   Murlean Iba, MD      Allergies    Bee venom, Iodine, Adhesive [tape], Ciprofloxacin, Povidone-iodine, Simvastatin, Codeine, and Latex    Review of Systems   Review of Systems  All other systems reviewed and are negative.   Physical Exam Updated Vital Signs BP 129/84   Pulse (!) 106   Temp 97.7 F (36.5 C) (Oral)   Resp 19   Ht 1.803 m ('5\' 11"'$ )   Wt 90.7 kg   SpO2 90%   BMI 27.89 kg/m  Physical Exam Vitals and nursing [name] reviewed.  Constitutional:      Appearance: He is well-developed. He is not toxic-appearing.  HENT:     Head: Normocephalic and atraumatic.     Nose: No congestion or rhinorrhea.     Mouth/Throat:     Mouth: Mucous membranes are moist.     Pharynx: No oropharyngeal exudate.  Eyes:     General: No scleral icterus.       Right eye: No discharge.        Left eye: No discharge.     Conjunctiva/sclera: Conjunctivae normal.     Pupils:  Pupils are equal, round, and reactive to light.  Neck:     Thyroid: No thyromegaly.     Vascular: No JVD.  Cardiovascular:     Rate and Rhythm: Regular rhythm. Tachycardia present.     Heart sounds: Normal heart sounds. No murmur heard.    No friction rub. No gallop.  Pulmonary:     Effort: Pulmonary effort is normal. No respiratory distress.     Breath sounds: Normal breath sounds. No wheezing or rales.  Abdominal:     General: Bowel sounds are normal. There is no distension.     Palpations: Abdomen is soft. There is no mass.     Tenderness: There is abdominal tenderness.     Comments: Tenderness in the lower  abdomen, no guarding or peritoneal signs, no pain McBurney's point  Genitourinary:    Comments: I inspected the genitals including his uncircumcised penis, retract foreskin, there is a normal-appearing penile head and urethra.  There is no discharge or drainage from the urethra.  Normal-appearing scrotum and testicles. Musculoskeletal:        General: Tenderness present. Normal range of motion.     Cervical back: Normal range of motion and neck supple.     Right lower leg: No edema.     Left lower leg: No edema.     Comments: The patient has pain with range of motion of certain joints secondary to metastatic disease but no effusions or overlying redness  Lymphadenopathy:     Cervical: No cervical adenopathy.  Skin:    General: Skin is warm and dry.     Findings: No erythema or rash.  Neurological:     General: No focal deficit present.     Mental Status: He is alert.     Coordination: Coordination normal.  Psychiatric:        Behavior: Behavior normal.     ED Results / Procedures / Treatments   Labs (all labs ordered are listed, but only abnormal results are displayed) Labs Reviewed  COMPREHENSIVE METABOLIC PANEL - Abnormal; Notable for the following components:      Result Value   Sodium 126 (*)    Chloride 89 (*)    Glucose, Bld 549 (*)    BUN 35 (*)     Creatinine, Ser 1.42 (*)    Calcium 8.8 (*)    Albumin 3.4 (*)    GFR, Estimated 48 (*)    All other components within normal limits  CBC WITH DIFFERENTIAL/PLATELET - Abnormal; Notable for the following components:   WBC 3.6 (*)    RBC 3.05 (*)    Hemoglobin 8.9 (*)    HCT 27.5 (*)    RDW 17.2 (*)    Platelets 113 (*)    Lymphs Abs 0.5 (*)    All other components within normal limits  URINALYSIS, ROUTINE W REFLEX MICROSCOPIC - Abnormal; Notable for the following components:   APPearance CLOUDY (*)    Glucose, UA >=500 (*)    Hgb urine dipstick SMALL (*)    Nitrite POSITIVE (*)    Leukocytes,Ua LARGE (*)    Bacteria, UA RARE (*)    All other components within normal limits  RESP PANEL BY RT-PCR (RSV, FLU A&B, COVID)  RVPGX2  CULTURE, BLOOD (ROUTINE X 2)  CULTURE, BLOOD (ROUTINE X 2)  URINE CULTURE  LACTIC ACID, PLASMA  LACTIC ACID, PLASMA  PROTIME-INR  APTT    EKG EKG Interpretation  Date/Time:  Saturday November 30 2022 08:29:00 EST Ventricular Rate:  108 PR Interval:  163 QRS Duration: 81 QT Interval:  314 QTC Calculation: 421 R Axis:   -15 Text Interpretation: Sinus tachycardia Multiple ventricular premature complexes Low voltage, extremity leads Confirmed by Noemi Chapel [phone]) on 11/30/2022 8:44:14 AM  Radiology DG Chest Port 1 View  Result Date: 11/30/2022 CLINICAL DATA:  Possible sepsis. EXAM: PORTABLE CHEST 1 VIEW COMPARISON:  11/12/2022 FINDINGS: RIGHT power port tip overlies the superior vena cava. Low lung volume. Heart size is normal. There is patchy opacity in the LEFT mid lung zone and LEFT lung base. LEFT hilar mass is better seen on prior studies. RIGHT lung is clear. IMPRESSION: 1. Low lung volume. 2. LEFT mid lung zone and LEFT LOWER lobe atelectasis or minimal infiltrate.  Electronically Signed   By: Nolon Nations M.D.   On: 11/30/2022 08:46    Procedures Procedures    Medications Ordered in ED Medications  lactated ringers infusion (0 mLs  Intravenous Stopped 11/30/22 1019)  sodium chloride 0.9 % bolus 1,000 mL (has no administration in time range)  cefTRIAXone (ROCEPHIN) 1 g in sodium chloride 0.9 % 100 mL IVPB (has no administration in time range)  0.9 %  sodium chloride infusion (has no administration in time range)  ceFEPIme (MAXIPIME) 2 g in sodium chloride 0.9 % 100 mL IVPB (has no administration in time range)  ceFEPIme (MAXIPIME) 2 g in sodium chloride 0.9 % 100 mL IVPB (0 g Intravenous Stopped 11/30/22 0902)  HYDROmorphone (DILAUDID) injection 0.5 mg (0.5 mg Intravenous Given 11/30/22 1043)  insulin aspart (novoLOG) injection 10 Units (10 Units Intravenous Given 11/30/22 1047)    ED Course/ Medical Decision Making/ A&P                             Medical Decision Making Amount and/or Complexity of Data Reviewed Labs: ordered. Radiology: ordered. ECG/medicine tests: ordered.  Risk OTC drugs. Prescription drug management. Decision regarding hospitalization.   This patient presents to the ED for concern of possible urinary retention or sepsis, this involves an extensive number of treatment options, and is a complaint that carries with it a high risk of complications and morbidity.  The differential diagnosis includes sepsis, urinary retention, cystitis, also with some shortness of breath could be related to developing pneumonia or even considering pulmonary embolism though that seems less likely given the big picture.   Co morbidities that complicate the patient evaluation  Metastatic cancer   Additional history obtained:  Additional history obtained from electronic medical record External records from outside source obtained and reviewed including oncology notes, see above Policastro, Marquis Lunch, MD is the urologist that the patient sees at [facility].  Evidently this patient has had a urinary sphincter placed with a scrotal mechanism for control.  He is supposed to be pushing down  on this to get urine out   Lab Tests:  I Ordered, and personally interpreted labs.  The pertinent results include:  UTI present - no leukocytosis.   Imaging Studies ordered:  I ordered imaging studies including chest x-ray I independently visualized and interpreted imaging which showed no acute findings I agree with the radiologist interpretation   Cardiac Monitoring: / EKG:  The patient was maintained on a cardiac monitor.  I personally viewed and interpreted the cardiac monitored which showed an underlying rhythm of: Sinus tachycardia   Consultations Obtained:  I requested consultation with the urologist Dr. Tresa Moore,  and discussed lab and imaging findings as well as pertinent plan - they recommend: transfer to [facility] where device was placed. D/w Dr. Bernarda Caffey - with [facility] - recommends transfer to [facility] when bed available - will consult with medicine.  He request that I try to deactivate the urinary sphincter.  I have attempted to do this with some success.  Postvoid residual will be checked intermittently to make sure it is getting better and not worse.  Awaiting medical consult from [facility] admitting service Unfortunately the transfer service at [facility] states that they do not have any beds available and they will not let me talk to a consultant until 1 is available.  I discussed the case with Dr. Manuella Ghazi of our hospitalist service  here who will facilitate transfer the patient to [facility] and continued care for what appears to be the urinary tract infection   Problem List / ED Course / Critical interventions / Medication management  I was able to examine the patient including his scrotum.  The device in the scrotum was enabled and this only allowed a couple of small drops of urine to come out.  He has increasing pressure in his bladder, I will consult with urology regarding neck steps including possible catheter placement with the complicating nature of  having a artificial urinary sphincter in place. After several attempts - was able to release the AUS to the point of some urinary output - PVR was 490.   At the request of the consultant at [facility] urology I was able to deactivate the urinary sphincter I ordered medication including Rocephin and IV fluids for severe hyperglycemia without DKA and a urinary tract infection. Reevaluation of the patient after these medicines showed that the patient improved I have reviewed the patients home medicines and have made adjustments as needed   Social Determinants of Health:  Metastatic cancer, currently lives with spouse   Test / Admission - Considered:  Will need admission.         Final Clinical Impression(s) / ED Diagnoses Final diagnoses:  Urinary retention  Acute cystitis without hematuria  Hyponatremia  Hyperglycemia  Metastatic malignant neoplasm, unspecified site [facility])     Noemi Chapel, MD 11/30/22 1231

## 2022-11-30 NOTE — ED Notes (Signed)
Hold all IVF and foley cath at this time per EDP.

## 2022-12-01 DIAGNOSIS — E1169 Type 2 diabetes mellitus with other specified complication: Secondary | ICD-10-CM

## 2022-12-01 DIAGNOSIS — A4159 Other Gram-negative sepsis: Secondary | ICD-10-CM | POA: Diagnosis not present

## 2022-12-01 DIAGNOSIS — N390 Urinary tract infection, site not specified: Secondary | ICD-10-CM

## 2022-12-01 DIAGNOSIS — E1165 Type 2 diabetes mellitus with hyperglycemia: Secondary | ICD-10-CM | POA: Diagnosis not present

## 2022-12-01 DIAGNOSIS — C3492 Malignant neoplasm of unspecified part of left bronchus or lung: Secondary | ICD-10-CM

## 2022-12-01 DIAGNOSIS — G9341 Metabolic encephalopathy: Secondary | ICD-10-CM

## 2022-12-01 DIAGNOSIS — I10 Essential (primary) hypertension: Secondary | ICD-10-CM

## 2022-12-01 DIAGNOSIS — E782 Mixed hyperlipidemia: Secondary | ICD-10-CM

## 2022-12-01 DIAGNOSIS — N1831 Chronic kidney disease, stage 3a: Secondary | ICD-10-CM

## 2022-12-01 DIAGNOSIS — Z794 Long term (current) use of insulin: Secondary | ICD-10-CM

## 2022-12-01 DIAGNOSIS — K219 Gastro-esophageal reflux disease without esophagitis: Secondary | ICD-10-CM

## 2022-12-01 LAB — BASIC METABOLIC PANEL
Anion gap: 9 (ref 5–15)
BUN: 38 mg/dL — ABNORMAL HIGH (ref 8–23)
CO2: 27 mmol/L (ref 22–32)
Calcium: 8.5 mg/dL — ABNORMAL LOW (ref 8.9–10.3)
Chloride: 94 mmol/L — ABNORMAL LOW (ref 98–111)
Creatinine, Ser: 1.62 mg/dL — ABNORMAL HIGH (ref 0.61–1.24)
GFR, Estimated: 41 mL/min — ABNORMAL LOW (ref >60)
Glucose, Bld: 388 mg/dL — ABNORMAL HIGH (ref 70–99)
Potassium: 4.4 mmol/L (ref 3.5–5.1)
Sodium: 130 mmol/L — ABNORMAL LOW (ref 135–145)

## 2022-12-01 LAB — GLUCOSE, CAPILLARY
Glucose-Capillary: 399 mg/dL — ABNORMAL HIGH (ref 70–99)
Glucose-Capillary: 272 mg/dL — ABNORMAL HIGH (ref 70–99)
Glucose-Capillary: 230 mg/dL — ABNORMAL HIGH (ref 70–99)
Glucose-Capillary: 237 mg/dL — ABNORMAL HIGH (ref 70–99)

## 2022-12-01 LAB — CBC
HCT: 26 % — ABNORMAL LOW (ref 39.0–52.0)
Hemoglobin: 8.3 g/dL — ABNORMAL LOW (ref 13.0–17.0)
MCH: 29.7 pg (ref 26.0–34.0)
MCHC: 31.9 g/dL (ref 30.0–36.0)
MCV: 93.2 fL (ref 80.0–100.0)
Platelets: 114 10*3/uL — ABNORMAL LOW (ref 150–400)
RBC: 2.79 MIL/uL — ABNORMAL LOW (ref 4.22–5.81)
RDW: 17.6 % — ABNORMAL HIGH (ref 11.5–15.5)
WBC: 4 10*3/uL (ref 4.0–10.5)
nRBC: 0 % (ref 0.0–0.2)

## 2022-12-01 LAB — MAGNESIUM: Magnesium: 1.8 mg/dL (ref 1.7–2.4)

## 2022-12-01 MED ORDER — FLUTICASONE FUROATE-VILANTEROL 200-25 MCG/ACT IN AEPB
1.0000 | INHALATION_SPRAY | Freq: Every day | RESPIRATORY_TRACT | Status: DC
  Administered 2022-12-01 – 2022-12-03 (×3): 1 via RESPIRATORY_TRACT
  Filled 2022-12-01: qty 28

## 2022-12-01 MED ORDER — HYDRALAZINE HCL 20 MG/ML IJ SOLN: 10.0000 mg | Freq: Four times a day (QID) | INTRAMUSCULAR | Status: DC | PRN

## 2022-12-01 MED ORDER — INSULIN ASPART 100 UNIT/ML IJ SOLN
0.0000 [IU] | Freq: Three times a day (TID) | INTRAMUSCULAR | Status: DC
  Administered 2022-12-01: 4 [IU] via SUBCUTANEOUS
  Administered 2022-12-01: 6 [IU] via SUBCUTANEOUS
  Administered 2022-12-02: 4 [IU] via SUBCUTANEOUS
  Administered 2022-12-02: 10 [IU] via SUBCUTANEOUS
  Administered 2022-12-03: 4 [IU] via SUBCUTANEOUS

## 2022-12-01 MED ORDER — INSULIN ASPART 100 UNIT/ML IJ SOLN: 5.0000 [IU] | Freq: Three times a day (TID) | INTRAMUSCULAR | Status: DC

## 2022-12-01 MED ORDER — INSULIN GLARGINE (1 UNIT DIAL) 300 UNIT/ML ~~LOC~~ SOPN: 35.0000 [IU] | PEN_INJECTOR | Freq: Every day | SUBCUTANEOUS | Status: DC

## 2022-12-01 MED ORDER — PROPRANOLOL HCL ER 80 MG PO CP24
80.0000 mg | ORAL_CAPSULE | Freq: Every day | ORAL | Status: DC
  Administered 2022-12-01 – 2022-12-02 (×2): 80 mg via ORAL
  Filled 2022-12-01 (×4): qty 1

## 2022-12-01 MED ORDER — INSULIN ASPART 100 UNIT/ML IJ SOLN: 8.0000 [IU] | Freq: Three times a day (TID) | INTRAMUSCULAR | Status: DC

## 2022-12-01 MED ORDER — SODIUM CHLORIDE 0.9 % IV BOLUS
500.0000 mL | Freq: Once | INTRAVENOUS | Status: AC
  Administered 2022-12-01: 500 mL via INTRAVENOUS

## 2022-12-01 MED ORDER — SODIUM CHLORIDE 0.9 % IV SOLN: INTRAVENOUS | Status: AC

## 2022-12-01 MED ORDER — PANTOPRAZOLE SODIUM 40 MG PO TBEC
40.0000 mg | DELAYED_RELEASE_TABLET | Freq: Two times a day (BID) | ORAL | Status: DC
  Administered 2022-12-01 – 2022-12-03 (×5): 40 mg via ORAL
  Filled 2022-12-01 (×5): qty 1

## 2022-12-01 MED ORDER — INSULIN ASPART 100 UNIT/ML IJ SOLN
5.0000 [IU] | Freq: Three times a day (TID) | INTRAMUSCULAR | Status: DC
  Administered 2022-12-02: 5 [IU] via SUBCUTANEOUS

## 2022-12-01 NOTE — Assessment & Plan Note (Signed)
Recent diagnosis of poorly differentiated adenocarcinoma of the lung 10/2022 with evidence of metastasis to the bones Patient is still undergoing outpatient evaluation and has yet to start chemotherapy Continue outpatient follow-up with Dr. Delton Coombes with oncology, patient reports he has a follow-up appoint with Dr. Delton Coombes on 3/13 at 3:15 PM.

## 2022-12-01 NOTE — Progress Notes (Signed)
       Overnight   NAME: [name] MRN: [id number] DOB : [birth year]    Date of Service   12/01/2022   HPI/Events of Note   Notified by RN for increased/continued nausea/vomiting x 1 episode. Patient also complained of dizziness. Vital signs revealed blood pressure 76 SBP. Mentation is preserved.    Interventions/ Plan   IV fluid bolus ordered Low rate IV fluid ordered until relief of nausea allows p.o. intake. As needed medications administered Follow-up vitals.      Update: Blood pressure has improved to 96/52 (65) with decreased nausea and relief from dizziness. Assessment ongoing.  Gershon Cull BSN MSNA MSN ACNPC-AG Acute Care Nurse Practitioner [place]

## 2022-12-01 NOTE — Hospital Course (Addendum)
88 y.o. male with extensive cancer history significant for mantle cell lymphoma (Dx 08/2013 S/P chemotherapy, follows with Dr. 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  Patient additionally has a history of hypertension, OSA, B12 deficiency, history of CAD, GERD, CKD stage IIIa, hyperlipidemia, peripheral vascular disease, insulin-dependent type 2 diabetes, and artificial urethral sphincter placement at [facility] 05/2022 (follows with Dr. Natasha Mead at [facility]).  Patient presented to the [facility] ED with difficulty urinating as well as generalized weakness .  Upon evaluation in the emergency department patient was felt to be suffering from sepsis secondary to urinary tract infection.  Due to the patient's artificial urinary sphincter that was placed 05/2022, EDP originally discussed case with urology at [facility].  Unfortunately, they did not have any beds and as a result were unable to accept and ED to ED transfer.  EDP then discussed case with Dr. Tresa Moore with urology who agreed to see patient in consultation upon transfer to [facility].  The hospitalist group was then called to assess the patient for admission and transfer to [facility].  Patient was initiated on intravenous antibiotic therapy with vancomycin and ceftriaxone.  Blood and urine cultures were obtained.  Upon evaluation on the medical floor on arrival to [facility] urology recommended Foley catheter placement and after long discussion patient declined this option.  Case was discussed with covering urologist Dr. Bernarda Caffey at [facility] health who recommended ensuring that the patient's artificial urethral catheter was deactivated while the patient was treated for his underlying complicated urinary tract infection.  Our local  urologist felt the patient's device was essentially nonoperational due to the fact that serial bladder scans revealed that the patient was adequately emptying his bladder.  After several days of intravenous antibiotics, patient exhibited improving lethargy and confusion with mentation approaching baseline.  Severe hyperglycemia that the patient initially presented with also dramatically improved as well with titration of basal bolus insulin therapy.    On 3/9, urine cultures grew out klebsiella pneumonia.  Antibiotic therapy was then de-escalated to oral Duricef with urology recommending a total of a 14-day course of therapy.  Patient was discharged home in improved and stable condition on 12/03/2022 having been advised to follow-up closely as an outpatient with his urologist Dr. Odis Luster and his oncologist Dr. Delton Coombes on 3/13.

## 2022-12-01 NOTE — Assessment & Plan Note (Signed)
Continuing home regimen of daily PPI therapy.  

## 2022-12-01 NOTE — Progress Notes (Signed)
Sent Dr. Cyd Silence secure chat and made MD aware that patient has been having runs of bigeminy and c/o dizziness with lying and sitting. Patient states this dizziness has been occurring prior to coming to the hospital. MD acknowledged and stated okay to obtain EKG. Patient irritable at this time and wants to eat his lunch refusing EKG at this time. Will obtain EKG when patient allows.

## 2022-12-01 NOTE — Assessment & Plan Note (Signed)
Strict intake and output monitoring Creatinine near baseline Minimizing nephrotoxic agents as much as possible Serial chemistries to monitor renal function and electrolytes  

## 2022-12-01 NOTE — Progress Notes (Signed)
   12/01/22 1937  Assess: MEWS Score  Temp 98.4 F (36.9 C)  BP (!) 78/56  MAP (mmHg) 65  Pulse Rate 82  ECG Heart Rate 84  Resp 18  Level of Consciousness Alert  Assess: MEWS Score  MEWS Temp 0  MEWS Systolic 2  MEWS Pulse 0  MEWS RR 0  MEWS LOC 0  MEWS Score 2  MEWS Score Color Yellow  Assess: if the MEWS score is Yellow or Red  Were vital signs taken at a resting state? Yes  Focused Assessment Change from prior assessment (see assessment flowsheet)  Does the patient meet 2 or more of the SIRS criteria? No  Does the patient have a confirmed or suspected source of infection? Yes  Provider and Rapid Response Notified? No  MEWS guidelines implemented  Yes, yellow  Treat  MEWS Interventions Considered administering scheduled or prn medications/treatments as ordered  Take Vital Signs  Increase Vital Sign Frequency  Yellow: Q2hr x1, continue Q4hrs until patient remains green for 12hrs  Escalate  MEWS: Escalate Yellow: Discuss with charge nurse and consider notifying provider and/or RRT  Notify: Charge Nurse/RN  Name of Charge Nurse/RN Notified Chloe RN  Provider Notification  Provider Name/Title J. Orma Render NP  Date Provider Notified 12/01/22  Time Provider Notified 2003  Method of Notification Page  Notification Reason Change in status (Vomiting after antiemetic given)  Provider response See new orders  Date of Provider Response 12/01/22  Time of Provider Response 2008  Assess: SIRS CRITERIA  SIRS Temperature  0  SIRS Pulse 0  SIRS Respirations  0  SIRS WBC 0  SIRS Score Sum  0

## 2022-12-01 NOTE — Assessment & Plan Note (Signed)
·   Please see assessment and plan above °

## 2022-12-01 NOTE — Assessment & Plan Note (Signed)
Patient presented with profound hyperglycemia and blood sugars in excess of 500 Severe hyperglycemia likely secondary to noncompliance and infection Titrating basal bolus therapy gradually, increasing to 35 units of Semglee daily with 8 units of NovoLog before every meal Hemoglobin A1c on 10/27/2022 7.2% Diabetic diet Accu-Cheks before every meal and nightly with sliding scale insulin

## 2022-12-01 NOTE — Assessment & Plan Note (Signed)
.   Continuing home regimen of lipid lowering therapy.  

## 2022-12-01 NOTE — Progress Notes (Cosign Needed)
  Transition of Care [facility]) Screening Note   Patient Details  Name: IDRISS QUACKENBUSH Date of Birth: [birth year]   Transition of Care [facility]) CM/SW Contact:    Henrietta Dine, RN Phone Number: 12/01/2022, 5:59 PM    Transition of Care Department [facility]) has reviewed patient and no TOC needs have been identified at this time. We will continue to monitor patient advancement through interdisciplinary progression rounds. If new patient transition needs arise, please place a TOC consult.

## 2022-12-01 NOTE — Assessment & Plan Note (Signed)
Patient presenting with multiple SIRS criteria with abnormal urinalysis and encephalopathy all suggestive of severe sepsis secondary to urinary tract infection. Urinary tract infection complicated by presence of faulty artificial urethral sphincter which likely had a role to play in the onset of this patient's illness. Urine culture on 3/9 growing out Klebsiella Continuing ceftriaxone Tachycardia and tachypnea improving Encephalopathy slowly improving Case discussed with Dr. Bernarda Caffey with [facility] on call Urologist on 3/10.  He recommends continued treatment of underlying infection and ensuring that the artificial urethral sphincter is deactivated with serial bladder scans to evaluate for urinary retention.  He recommends against placing an indwelling Foley catheter for a prolonged period of time. Case also evaluated by [facility] evening of 3/9.  Case discussed again with urology resident on 3/10.  While patient refused Foley catheter during their initial evaluation on 3/9, they feel that since patient's serial bladder scans revealed no significant retention that the artificial urethral catheter is "faulty and/or deactivated."  Patient will need outpatient follow-up with his urologist at [facility] after discharge. Anticipate possible discharge on 3/12 if continued clinical improvement

## 2022-12-01 NOTE — Assessment & Plan Note (Signed)
Secondary to sepsis Slowly improving, now nearing baseline Supportive care Anticipate discharge on 3/12 if continuing to clinically improve See remainder of assessment and plan above

## 2022-12-01 NOTE — Progress Notes (Signed)
PROGRESS NOTE   [name]  [id number] DOB: May 05, 1935 DOA: 11/30/2022 PCP: Brandon Squibb, MD   Date of Service: the patient was seen and examined on 12/01/2022  Brief Narrative:  88 y.o. male with extensive cancer history significant for mantle cell lymphoma (Dx 08/2013 S/P chemotherapy, follows with Dr. 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  Patient additionally has a history of hypertension, OSA, B12 deficiency, history of CAD, GERD, CKD stage IIIa, hyperlipidemia, peripheral vascular disease, insulin-dependent type 2 diabetes, and artificial urethral sphincter placement at [facility] 05/2022 (follows with Dr. Natasha Mead at [facility]).  Patient presented to the [facility] ED with difficulty urinating as well as generalized weakness .  Upon evaluation in the emergency department patient was felt to be suffering from sepsis secondary to urinary tract infection.  Due to the patient's artificial urinary sphincter that was placed 05/2022, EDP originally discussed case with urology at [facility].  Unfortunately, they did not have any beds and as a result declined an ED to ED transfer.  EDP then discussed case with Dr. Tresa Moore with urology who agreed to see patient in consultation upon transfer to [facility].  The hospitalist group was then called to assess the patient for admission and transfer to [facility].  Patient was initiated on intravenous antibiotic therapy with vancomycin and ceftriaxone.  Blood and urine cultures were obtained.  Upon evaluation on the medical floor on arrival to [facility] urology recommended Foley catheter placement and after long discussion patient declined this option.   Assessment and Plan: * Complicated UTI (urinary tract infection) Patient presenting with  multiple SIRS criteria with abnormal urinalysis and encephalopathy all suggestive of severe sepsis secondary to urinary tract infection. Urinary tract infection complicated by presence of faulty artificial urethral sphincter which likely had a role to play in the onset of this patient's illness. Slow clinical proven on intravenous ceftriaxone Urine cultures growing out Klebsiella at this point, sensitivities pending Tachycardia and tachypnea improving Encephalopathy slowly improving Case discussed with Dr. Bernarda Caffey with [facility] on call Urologist.  He recommends continued treatment of underlying infection and ensuring that the artificial urethral sphincter is deactivated with serial bladder scans to evaluate for urinary retention.  He recommends against placing an indwelling Foley catheter for a prolonged period of time. Case evaluated by Dr. Tresa Moore with urology evening of 3/9.  Case discussed again with urology resident on 3/10.  While patient refused Foley catheter during their initial evaluation, they feel that since patient's serial bladder scans revealed no significant retention that the artificial urethral catheter is "faulty and/or deactivated."  Patient will need outpatient follow-up with his urologist at [facility] after discharge.   Sepsis due to Klebsiella [facility]) Please see assessment and plan above  Acute metabolic encephalopathy Secondary to sepsis Slowly improving Supportive care Consider expansion of workup for encephalopathy if confusion does not improve See remainder of assessment and plan above  Uncontrolled type 2 diabetes mellitus with hyperglycemia, with long-term current use of insulin [facility]) Patient presented with profound hyperglycemia and blood sugars in excess of 500 Severe hyperglycemia likely secondary to noncompliance and infection Slowly resuming and titrating basal bolus insulin therapy Hemoglobin A1c on 10/27/2022 7.2% Diabetic diet Accu-Cheks before every meal and nightly  with sliding scale insulin  Chronic kidney disease, stage 3a (HCC) Strict intake and output monitoring Creatinine near baseline  Minimizing nephrotoxic agents as much as possible Serial chemistries to monitor renal function and electrolytes   Primary lung adenocarcinoma, left (HCC) Recent diagnosis of poorly differentiated adenocarcinoma of the lung 10/2022 with evidence of metastasis to the bones Patient is still undergoing outpatient evaluation and has yet to start chemotherapy Continue outpatient follow-up with Dr. Delton Coombes with oncology  Essential hypertension Resume patients home regimen of oral antihypertensives Titrate antihypertensive regimen as necessary to achieve adequate BP control PRN intravenous antihypertensives for excessively elevated blood pressure   Mixed diabetic hyperlipidemia associated with type 2 diabetes mellitus ([place]) Continuing home regimen of lipid lowering therapy.   GERD without esophagitis Continuing home regimen of daily PPI therapy.      Subjective:  Patient denies abdominal pain.  Patient states that he has no control over his ability to hold urine or void.  Patient still complaining of lethargy.  Physical Exam:  Vitals:   12/01/22 1302 12/01/22 1304 12/01/22 1937 12/01/22 2057  BP: (!) 131/103 95/64 (!) 78/56 (!) 96/52  Pulse: 78 77 82 (!) 41  Resp: '20 17 18   '$ Temp:  98.4 F (36.9 C) 98.4 F (36.9 C) 98.4 F (36.9 C)  TempSrc:  Oral Oral Oral  SpO2: 99% 99%    Weight:      Height:        Constitutional:   Lethargic but arousable.  Oriented however with waxing and waning confusion  Skin: no rashes, no lesions, poor skin turgor noted. Eyes: Pupils are equally reactive to light.  No evidence of scleral icterus or conjunctival pallor.  ENMT: Moist mucous membranes noted.  Posterior pharynx clear of any exudate or lesions.   Respiratory: clear to auscultation bilaterally, no wheezing, no crackles. Normal respiratory effort. No  accessory muscle use.  Cardiovascular: Regular rate and rhythm, no murmurs / rubs / gallops. No extremity edema. 2+ pedal pulses. No carotid bruits.  Abdomen: Abdomen is soft mild lower abdominal tenderness.  No evidence of intra-abdominal masses.  Positive bowel sounds noted in all quadrants.   Musculoskeletal: No joint deformity upper and lower extremities. Good ROM, no contractures. Normal muscle tone.    Data Reviewed:  I have personally reviewed and interpreted labs, imaging.  Significant findings are   CBC: Recent Labs  Lab 11/30/22 0744 12/01/22 0424  WBC 3.6* 4.0  NEUTROABS 2.7  --   HGB 8.9* 8.3*  HCT 27.5* 26.0*  MCV 90.2 93.2  PLT 113* [id number]*   Basic Metabolic Panel: Recent Labs  Lab 11/30/22 0744 12/01/22 0424  NA 126* 130*  K 4.0 4.4  CL 89* 94*  CO2 23 27  GLUCOSE 549* 388*  BUN 35* 38*  CREATININE 1.42* 1.62*  CALCIUM 8.8* 8.5*  MG  --  1.8   GFR: Estimated Creatinine Clearance: 38.6 mL/min (A) (by C-G formula based on SCr of 1.62 mg/dL (H)). Liver Function Tests: Recent Labs  Lab 11/30/22 0744  AST 16  ALT 17  ALKPHOS 90  BILITOT 1.0  PROT 6.8  ALBUMIN 3.4*    Coagulation Profile: Recent Labs  Lab 11/30/22 0744  INR 1.1     EKG: Personally reviewed.  Rhythm is normal sinus rhythm with heart rate of 75 bpm.  No dynamic ST segment changes appreciated.   Code Status:  Full code.   Family Communication: Case discussed with daughter via phone conversation who has been updated on plan of care   Severity of Illness:  The appropriate patient status for this patient is INPATIENT. Inpatient status  is judged to be reasonable and necessary in order to provide the required intensity of service to ensure the patient's safety. The patient's presenting symptoms, physical exam findings, and initial radiographic and laboratory data in the context of their chronic comorbidities is felt to place them at high risk for further clinical deterioration.  Furthermore, it is not anticipated that the patient will be medically stable for discharge from the hospital within 2 midnights of admission.   * I certify that at the point of admission it is my clinical judgment that the patient will require inpatient hospital care spanning beyond 2 midnights from the point of admission due to high intensity of service, high risk for further deterioration and high frequency of surveillance required.*  Time spent:  66 minutes  Author:  Vernelle Emerald MD  12/01/2022 9:39 PM

## 2022-12-02 DIAGNOSIS — G9341 Metabolic encephalopathy: Secondary | ICD-10-CM | POA: Diagnosis not present

## 2022-12-02 DIAGNOSIS — N390 Urinary tract infection, site not specified: Secondary | ICD-10-CM | POA: Diagnosis not present

## 2022-12-02 DIAGNOSIS — A4159 Other Gram-negative sepsis: Secondary | ICD-10-CM | POA: Diagnosis not present

## 2022-12-02 DIAGNOSIS — I2510 Atherosclerotic heart disease of native coronary artery without angina pectoris: Secondary | ICD-10-CM

## 2022-12-02 DIAGNOSIS — E1165 Type 2 diabetes mellitus with hyperglycemia: Secondary | ICD-10-CM | POA: Diagnosis not present

## 2022-12-02 LAB — CBC WITH DIFFERENTIAL/PLATELET
Abs Immature Granulocytes: 0.04 10*3/uL (ref 0.00–0.07)
Basophils Absolute: 0 10*3/uL (ref 0.0–0.1)
Basophils Relative: 1 %
Eosinophils Absolute: 0.1 10*3/uL (ref 0.0–0.5)
Eosinophils Relative: 3 %
HCT: 28.5 % — ABNORMAL LOW (ref 39.0–52.0)
Hemoglobin: 8.7 g/dL — ABNORMAL LOW (ref 13.0–17.0)
Immature Granulocytes: 1 %
Lymphocytes Relative: 15 %
Lymphs Abs: 0.6 10*3/uL — ABNORMAL LOW (ref 0.7–4.0)
MCH: 28.9 pg (ref 26.0–34.0)
MCHC: 30.5 g/dL (ref 30.0–36.0)
MCV: 94.7 fL (ref 80.0–100.0)
Monocytes Absolute: 0.3 10*3/uL (ref 0.1–1.0)
Monocytes Relative: 7 %
Neutro Abs: 3 10*3/uL (ref 1.7–7.7)
Neutrophils Relative %: 73 %
Platelets: 117 10*3/uL — ABNORMAL LOW (ref 150–400)
RBC: 3.01 MIL/uL — ABNORMAL LOW (ref 4.22–5.81)
RDW: 17.8 % — ABNORMAL HIGH (ref 11.5–15.5)
WBC: 4.1 10*3/uL (ref 4.0–10.5)
nRBC: 0 % (ref 0.0–0.2)

## 2022-12-02 LAB — COMPREHENSIVE METABOLIC PANEL
ALT: 12 U/L (ref 0–44)
AST: 12 U/L — ABNORMAL LOW (ref 15–41)
Albumin: 2.9 g/dL — ABNORMAL LOW (ref 3.5–5.0)
Alkaline Phosphatase: 72 U/L (ref 38–126)
Anion gap: 10 (ref 5–15)
BUN: 48 mg/dL — ABNORMAL HIGH (ref 8–23)
CO2: 24 mmol/L (ref 22–32)
Calcium: 8.5 mg/dL — ABNORMAL LOW (ref 8.9–10.3)
Chloride: 95 mmol/L — ABNORMAL LOW (ref 98–111)
Creatinine, Ser: 1.77 mg/dL — ABNORMAL HIGH (ref 0.61–1.24)
GFR, Estimated: 37 mL/min — ABNORMAL LOW (ref >60)
Glucose, Bld: 360 mg/dL — ABNORMAL HIGH (ref 70–99)
Potassium: 4.7 mmol/L (ref 3.5–5.1)
Sodium: 129 mmol/L — ABNORMAL LOW (ref 135–145)
Total Bilirubin: 0.6 mg/dL (ref 0.3–1.2)
Total Protein: 6.1 g/dL — ABNORMAL LOW (ref 6.5–8.1)

## 2022-12-02 LAB — URINE CULTURE: Culture: >=100000 — AB

## 2022-12-02 LAB — GLUCOSE, CAPILLARY
Glucose-Capillary: 469 mg/dL — ABNORMAL HIGH (ref 70–99)
Glucose-Capillary: 478 mg/dL — ABNORMAL HIGH (ref 70–99)
Glucose-Capillary: 376 mg/dL — ABNORMAL HIGH (ref 70–99)
Glucose-Capillary: 244 mg/dL — ABNORMAL HIGH (ref 70–99)
Glucose-Capillary: 243 mg/dL — ABNORMAL HIGH (ref 70–99)

## 2022-12-02 LAB — MAGNESIUM: Magnesium: 1.9 mg/dL (ref 1.7–2.4)

## 2022-12-02 MED ORDER — INSULIN GLARGINE-YFGN 100 UNIT/ML ~~LOC~~ SOLN: 35.0000 [IU] | Freq: Every day | SUBCUTANEOUS | Status: DC

## 2022-12-02 MED ORDER — POLYETHYLENE GLYCOL 3350 17 G PO PACK
17.0000 g | PACK | Freq: Once | ORAL | Status: AC
  Administered 2022-12-02: 17 g via ORAL
  Filled 2022-12-02: qty 1

## 2022-12-02 MED ORDER — INSULIN GLARGINE-YFGN 100 UNIT/ML ~~LOC~~ SOLN
15.0000 [IU] | Freq: Once | SUBCUTANEOUS | Status: AC
  Administered 2022-12-02: 15 [IU] via SUBCUTANEOUS
  Filled 2022-12-02: qty 0.15

## 2022-12-02 MED ORDER — INSULIN GLARGINE-YFGN 100 UNIT/ML ~~LOC~~ SOLN
35.0000 [IU] | Freq: Every day | SUBCUTANEOUS | Status: DC
  Filled 2022-12-02: qty 0.35

## 2022-12-02 MED ORDER — INSULIN ASPART 100 UNIT/ML IJ SOLN
14.0000 [IU] | Freq: Once | INTRAMUSCULAR | Status: AC
  Administered 2022-12-02: 14 [IU] via SUBCUTANEOUS

## 2022-12-02 MED ORDER — INSULIN ASPART 100 UNIT/ML IJ SOLN
8.0000 [IU] | Freq: Three times a day (TID) | INTRAMUSCULAR | Status: DC
  Administered 2022-12-02 (×2): 8 [IU] via SUBCUTANEOUS

## 2022-12-02 MED ORDER — POLYETHYLENE GLYCOL 3350 17 G PO PACK: 17.0000 g | PACK | Freq: Every day | ORAL | Status: DC | PRN

## 2022-12-02 MED ORDER — SODIUM CHLORIDE 0.9 % IV SOLN: INTRAVENOUS | Status: DC

## 2022-12-02 MED ORDER — PHENOL 1.4 % MT LIQD: 1.0000 | OROMUCOSAL | Status: DC | PRN

## 2022-12-02 MED ORDER — MELATONIN 5 MG PO TABS: 10.0000 mg | ORAL_TABLET | Freq: Every evening | ORAL | Status: DC | PRN

## 2022-12-02 NOTE — Inpatient Diabetes Management (Signed)
Inpatient Diabetes Program Recommendations  AACE/ADA: New Consensus Statement on Inpatient Glycemic Control (2015)  Target Ranges:  Prepandial:   less than 140 mg/dL      Peak postprandial:   less than 180 mg/dL (1-2 hours)      Critically ill patients:  140 - 180 mg/dL   Lab Results  Component Value Date   GLUCAP 478 (H) 12/02/2022   HGBA1C 7.2 (H) 10/27/2022    Review of Glycemic Control  Diabetes history: DM2 Outpatient Diabetes medications: Toujeo 72 units QAM, Novolog 8-10 units TID, metformin 750 mg QD (not taking) Current orders for Inpatient glycemic control: Semglee 20 QD, Novolog 0-10 TID + 5 units TID  HgbA1C - 7.2%  Inpatient Diabetes Program Recommendations:    Increase Semglee to 35 units QD  Increase Novolog to 8 units TID  Continue to follow.  Thank you. Lorenda Peck, RD, LDN, [place] Inpatient Diabetes Coordinator [phone]

## 2022-12-02 NOTE — Progress Notes (Signed)
PROGRESS NOTE   [name]  [id number] DOB: [birth year] DOA: 11/30/2022 PCP: Celene Squibb, MD   Date of Service: the patient was seen and examined on 12/03/2022  Brief Narrative:  88 y.o. male with extensive cancer history significant for mantle cell lymphoma (Dx 08/2013 S/P chemotherapy, follows with Dr. 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  Patient additionally has a history of hypertension, OSA, B12 deficiency, history of CAD, GERD, CKD stage IIIa, hyperlipidemia, peripheral vascular disease, insulin-dependent type 2 diabetes, and artificial urethral sphincter placement at [facility] 05/2022 (follows with Dr. Natasha Mead at [facility]).  Patient presented to the [facility] ED with difficulty urinating as well as generalized weakness .  Upon evaluation in the emergency department patient was felt to be suffering from sepsis secondary to urinary tract infection.  Due to the patient's artificial urinary sphincter that was placed 05/2022, EDP originally discussed case with urology at [facility].  Unfortunately, they did not have any beds and as a result declined an ED to ED transfer.  EDP then discussed case with Dr. Tresa Moore with urology who agreed to see patient in consultation upon transfer to [name].  The hospitalist group was then called to assess the patient for admission and transfer to [facility].  Patient was initiated on intravenous antibiotic therapy with vancomycin and ceftriaxone.  Blood and urine cultures were obtained.  Upon evaluation on the medical floor on arrival to [facility] urology recommended Foley catheter placement and after long discussion patient declined this option.   Assessment and Plan: * Complicated UTI (urinary tract infection) Patient presenting with  multiple SIRS criteria with abnormal urinalysis and encephalopathy all suggestive of severe sepsis secondary to urinary tract infection. Urinary tract infection complicated by presence of faulty artificial urethral sphincter which likely had a role to play in the onset of this patient's illness. Urine culture on 3/9 growing out Klebsiella Continuing ceftriaxone Tachycardia and tachypnea improving Encephalopathy slowly improving Case discussed with Dr. Bernarda Caffey with [facility] on call Urologist on 3/10.  He recommends continued treatment of underlying infection and ensuring that the artificial urethral sphincter is deactivated with serial bladder scans to evaluate for urinary retention.  He recommends against placing an indwelling Foley catheter for a prolonged period of time. Case also evaluated [facility].  Case discussed again with urology resident on 3/10.  While patient refused Foley catheter during their initial evaluation on 3/9, they feel that since patient's serial bladder scans revealed no significant retention that the artificial urethral catheter is "faulty and/or deactivated."  Patient will need outpatient follow-up with his urologist at [facility] after discharge. Anticipate possible discharge on 3/12 if continued clinical improvement   Sepsis due to Klebsiella [facility]) Please see assessment and plan above  Acute metabolic encephalopathy Secondary to sepsis Slowly improving, now nearing baseline Supportive care Anticipate discharge on 3/12 if continuing to clinically improve See remainder of assessment and plan above  Uncontrolled type 2 diabetes mellitus with hyperglycemia, with long-term current use of insulin [facility]) Patient presented with profound hyperglycemia and blood sugars in excess of 500 Severe hyperglycemia likely secondary to noncompliance and infection Titrating basal bolus therapy gradually, increasing to 35 units of Semglee daily with 8 units  of NovoLog before every meal Hemoglobin A1c on 10/27/2022 7.2% Diabetic diet Accu-Cheks before every meal  and nightly with sliding scale insulin  Chronic kidney disease, stage 3a (HCC) Creatinine slightly rising Initiating gentle intravenous fluids to avoid progression into acute kidney injury Strict intake and output monitoring Creatinine near baseline Minimizing nephrotoxic agents as much as possible Serial chemistries to monitor renal function and electrolytes   Primary lung adenocarcinoma, left (HCC) Recent diagnosis of poorly differentiated adenocarcinoma of the lung 10/2022 with evidence of metastasis to the bones Patient is still undergoing outpatient evaluation and has yet to start chemotherapy Continue outpatient follow-up with Dr. Delton Coombes with oncology, patient reports he has a follow-up appoint with Dr. Delton Coombes on 3/13 at 3:15 PM.  Essential hypertension Continuing patient's home regimen of amlodipine Titrate antihypertensive regimen as necessary to achieve adequate BP control PRN intravenous antihypertensives for excessively elevated blood pressure   Mixed diabetic hyperlipidemia associated with type 2 diabetes mellitus ([place]) Continuing home regimen of atorvastatin.   GERD without esophagitis Continuing home regimen of daily PPI therapy.   Subjective:  Patient states that his strength is improving.  Patient continues to deny abdominal pain or dysuria.  Patient states that he has no control over his ability to hold urine or void.    Physical Exam:  Vitals:   12/02/22 1036 12/02/22 1056 12/02/22 1149 12/02/22 2034  BP: 101/64 105/63 (!) 108/59 111/66  Pulse: 96  94 95  Resp: '20  20 18  '$ Temp: 98.5 F (36.9 C)  98.9 F (37.2 C) 98.1 F (36.7 C)  TempSrc: Oral  Oral Oral  SpO2: 94%  93% 93%  Weight:      Height:        Constitutional:   Awake alert and oriented x 3, oriented however with waxing and waning confusion  Skin: no rashes, no lesions, poor  skin turgor noted. Eyes: Pupils are equally reactive to light.  No evidence of scleral icterus or conjunctival pallor.  ENMT: Moist mucous membranes noted.  Posterior pharynx clear of any exudate or lesions.   Respiratory: clear to auscultation bilaterally, no wheezing, no crackles. Normal respiratory effort. No accessory muscle use.  Cardiovascular: Regular rate and rhythm, no murmurs / rubs / gallops. No extremity edema. 2+ pedal pulses. No carotid bruits.  Abdomen: Continued lower abdominal tenderness.  Abdomen is soft.  No evidence of intra-abdominal masses.  Positive bowel sounds noted in all quadrants.   Musculoskeletal: No joint deformity upper and lower extremities. Good ROM, no contractures. Normal muscle tone.    Data Reviewed:  I have personally reviewed and interpreted labs, imaging.  Significant findings are   CBC: Recent Labs  Lab 11/30/22 0744 12/01/22 0424 12/02/22 0332  WBC 3.6* 4.0 4.1  NEUTROABS 2.7  --  3.0  HGB 8.9* 8.3* 8.7*  HCT 27.5* 26.0* 28.5*  MCV 90.2 93.2 94.7  PLT 113* 114* [id number]*   Basic Metabolic Panel: Recent Labs  Lab 11/30/22 0744 12/01/22 0424 12/02/22 0332  NA 126* 130* 129*  K 4.0 4.4 4.7  CL 89* 94* 95*  CO2 '23 27 24  '$ GLUCOSE 549* 388* 360*  BUN 35* 38* 48*  CREATININE 1.42* 1.62* 1.77*  CALCIUM 8.8* 8.5* 8.5*  MG  --  1.8 1.9   GFR: Estimated Creatinine Clearance: 35.3 mL/min (A) (by C-G formula based on SCr of 1.77 mg/dL (H)). Liver Function Tests: Recent Labs  Lab 11/30/22 0744 12/02/22 0332  AST 16 12*  ALT 17 12  ALKPHOS 90 72  BILITOT 1.0 0.6  PROT 6.8 6.1*  ALBUMIN 3.4* 2.9*    Coagulation  Profile: Recent Labs  Lab 11/30/22 0744  INR 1.1      Code Status:  Full code.   Family Communication: Case discussed with daughter via phone conversation who has been updated on plan of care   Severity of Illness:  The appropriate patient status for this patient is INPATIENT. Inpatient status is judged to be  reasonable and necessary in order to provide the required intensity of service to ensure the patient's safety. The patient's presenting symptoms, physical exam findings, and initial radiographic and laboratory data in the context of their chronic comorbidities is felt to place them at high risk for further clinical deterioration. Furthermore, it is not anticipated that the patient will be medically stable for discharge from the hospital within 2 midnights of admission.   * I certify that at the point of admission it is my clinical judgment that the patient will require inpatient hospital care spanning beyond 2 midnights from the point of admission due to high intensity of service, high risk for further deterioration and high frequency of surveillance required.*  Time spent:  54 minutes  Author:  Vernelle Emerald MD  12/03/2022 12:17 AM

## 2022-12-02 NOTE — TOC Initial Note (Signed)
Transition of Care [facility]) - Initial/Assessment Note    Patient Details  Name: [name] MRN: [id number] Date of Birth: 08-07-1935  [facility] of [facility]) CM/SW Contact:    Illene Regulus, LCSW Phone Number: 12/02/2022, 10:51 AM  Clinical Narrative:                  Pt is active with [facility] for HHPT/RN and aide.         Patient Goals and CMS Choice            Expected Discharge Plan and Services                                              Prior Living Arrangements/Services                       Activities of Daily Living Home Assistive Devices/Equipment: Cane (specify quad or straight), Walker (specify type) (rolling walker) ADL Screening (condition at time of admission) Patient's cognitive ability adequate to safely complete daily activities?: Yes Is the patient deaf or have difficulty hearing?: No Does the patient have difficulty seeing, even when wearing glasses/contacts?: No Does the patient have difficulty concentrating, remembering, or making decisions?: No Patient able to express need for assistance with ADLs?: Yes Does the patient have difficulty dressing or bathing?: No Independently performs ADLs?: Yes (appropriate for developmental age) Does the patient have difficulty walking or climbing stairs?: Yes Weakness of Legs: Both Weakness of Arms/Hands: Both  Permission Sought/Granted                  Emotional Assessment              Admission diagnosis:  Urinary retention [R33.9] Hyponatremia [E87.1] Hyperglycemia [R73.9] Acute cystitis without hematuria [N30.00] Sepsis ([place]) [A41.9] Metastatic malignant neoplasm, unspecified site [facility]) [C79.9] Patient Active Problem List   Diagnosis Date Noted   Chronic kidney disease, stage 3a ([place]) 12/01/2022   Sepsis due to Klebsiella ([place]) 12/01/2022   Mixed diabetic hyperlipidemia associated with type 2 diabetes mellitus ([place]) 12/01/2022   Sepsis ([place])  11/30/2022   Primary lung adenocarcinoma, left ([place]) 11/13/2022   Mass of lower lobe of left lung 10/28/2022   Mediastinal adenopathy 10/28/2022   Cough with hemoptysis 10/26/2022   Hypokalemia 10/26/2022   Preoperative cardiovascular examination 04/11/2022   Dizziness 10/22/2021   Aortic atherosclerosis ([place]) 10/20/2021   Carotid artery plaque 10/17/2021   Cervical radiculopathy 10/15/2021   Altered mental status    Chronic renal failure, stage 3b ([place])    Acute metabolic encephalopathy [id number]   Acute pyelonephritis 08/30/2021   Sepsis secondary to UTI ([place]) 08/28/2021   Hyperglycemia 08/28/2021   Obesity (BMI 30.0-34.9) 08/28/2021   Tremors of nervous system 08/28/2021   Insomnia [id number]   Complicated UTI (urinary tract infection) 08/27/2021   COVID-19 virus infection 06/04/2021   Imbalance 05/06/2021   Hydronephrosis 05/06/2021   Acute respiratory failure with hypoxia ([place]) 05/06/2021   Asthma 05/06/2021   Uncontrolled type 2 diabetes mellitus with hyperglycemia, with long-term current use of insulin ([place])    Proteinuria 03/14/2021   Diabetic peripheral neuropathy ([place]) 03/07/2021   Asymptomatic cholelithiasis 07/06/2019   Peripheral polyneuropathy 01/16/2019   Right lower lobe lung mass 08/01/2016   Upper airway cough syndrome 07/30/2016   Chronic asthma vs UACS  07/11/2016   Dupuytren's contracture  06/06/2016   Mild cognitive impairment 01/22/2016   Essential tremor 10/17/2015   Acute renal failure superimposed on stage 3b chronic kidney disease ([place]) 07/02/2015   AKI (acute kidney injury) ([place]) 06/30/2015   Acute kidney injury ([place]) 06/30/2015   Status post total right knee replacement 03/29/2014   B12 deficiency 02/07/2014   Diverticulosis of colon with hemorrhage 01/01/2014   Thrombocytopenia, unspecified ([place]) 01/01/2014   Acute blood loss anemia 01/01/2014   Leukopenia 12/31/2013   Lower GI bleed 12/30/2013   Diverticulitis 12/30/2013   Abdominal pain,  left lower quadrant 11/16/2013   Abdominal pain, unspecified site 11/16/2013   Mantle cell lymphoma ([place]) 09/07/2013   Prostate cancer ([place]) 09/07/2013   Proctitis, 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   H/O ulcerative colitis 04/12/2013   OSA (obstructive sleep apnea) 08/15/2012   VASOMOTOR RHINITIS 01/23/2010   GERD without esophagitis 01/23/2010   SOB (shortness of breath) 01/01/2010   Essential hypertension 11/23/2009   CAD (coronary artery disease) 02/18/2009   PCP:  Celene Squibb, MD Pharmacy:   [facility] [address] [place] [address] Phone: [phone] Fax: [phone]     Social Determinants of Health (SDOH) Social History: SDOH Screenings   Food Insecurity: No Food Insecurity (11/30/2022)  Housing: Low Risk  (11/30/2022)  Transportation Needs: No Transportation Needs (11/30/2022)  Utilities: Not At Risk (11/30/2022)  Tobacco Use: Low Risk  (11/30/2022)   SDOH Interventions:     Readmission Risk Interventions    08/30/2021   11:52 AM 06/12/2021   12:39 PM 06/12/2021   10:43 AM  Readmission Risk Prevention Plan  Transportation Screening Complete Complete   PCP or Specialist Appt within 3-5 Days Complete  Complete  HRI or Home Care Consult Complete  Complete  Social Work Consult for [place] Planning/Counseling Complete  Complete  Palliative Care Screening Not Applicable  Not Applicable  Medication Review Press photographer) Complete  Complete

## 2022-12-02 NOTE — Progress Notes (Signed)
Pt daughter stated to RN that pt wife not compliant with helping administer home meds - not giving meds at all or at appropriate times. When daughter asks about or tries to investigate, wife "snatches" pill bottles away.  Pt daughter states that wife will not let RN, aides, or home health PT/OT in the house.  She requests to talk to [facility] and may want help following up with social services.

## 2022-12-03 DIAGNOSIS — N390 Urinary tract infection, site not specified: Secondary | ICD-10-CM | POA: Diagnosis not present

## 2022-12-03 DIAGNOSIS — A4159 Other Gram-negative sepsis: Secondary | ICD-10-CM | POA: Diagnosis not present

## 2022-12-03 DIAGNOSIS — G9341 Metabolic encephalopathy: Secondary | ICD-10-CM | POA: Diagnosis not present

## 2022-12-03 DIAGNOSIS — E1165 Type 2 diabetes mellitus with hyperglycemia: Secondary | ICD-10-CM | POA: Diagnosis not present

## 2022-12-03 LAB — CBC WITH DIFFERENTIAL/PLATELET
Abs Immature Granulocytes: 0.03 10*3/uL (ref 0.00–0.07)
Basophils Absolute: 0 10*3/uL (ref 0.0–0.1)
Basophils Relative: 0 %
Eosinophils Absolute: 0.1 10*3/uL (ref 0.0–0.5)
Eosinophils Relative: 1 %
HCT: 26 % — ABNORMAL LOW (ref 39.0–52.0)
Hemoglobin: 8.1 g/dL — ABNORMAL LOW (ref 13.0–17.0)
Immature Granulocytes: 1 %
Lymphocytes Relative: 13 %
Lymphs Abs: 0.5 10*3/uL — ABNORMAL LOW (ref 0.7–4.0)
MCH: 29.2 pg (ref 26.0–34.0)
MCHC: 31.2 g/dL (ref 30.0–36.0)
MCV: 93.9 fL (ref 80.0–100.0)
Monocytes Absolute: 0.3 10*3/uL (ref 0.1–1.0)
Monocytes Relative: 8 %
Neutro Abs: 3.1 10*3/uL (ref 1.7–7.7)
Neutrophils Relative %: 77 %
Platelets: 103 10*3/uL — ABNORMAL LOW (ref 150–400)
RBC: 2.77 MIL/uL — ABNORMAL LOW (ref 4.22–5.81)
RDW: 17.8 % — ABNORMAL HIGH (ref 11.5–15.5)
WBC: 4 10*3/uL (ref 4.0–10.5)
nRBC: 0 % (ref 0.0–0.2)

## 2022-12-03 LAB — COMPREHENSIVE METABOLIC PANEL
ALT: 13 U/L (ref 0–44)
AST: 12 U/L — ABNORMAL LOW (ref 15–41)
Albumin: 2.7 g/dL — ABNORMAL LOW (ref 3.5–5.0)
Alkaline Phosphatase: 66 U/L (ref 38–126)
Anion gap: 11 (ref 5–15)
BUN: 43 mg/dL — ABNORMAL HIGH (ref 8–23)
CO2: 22 mmol/L (ref 22–32)
Calcium: 8.5 mg/dL — ABNORMAL LOW (ref 8.9–10.3)
Chloride: 100 mmol/L (ref 98–111)
Creatinine, Ser: 1.65 mg/dL — ABNORMAL HIGH (ref 0.61–1.24)
GFR, Estimated: 40 mL/min — ABNORMAL LOW (ref >60)
Glucose, Bld: 291 mg/dL — ABNORMAL HIGH (ref 70–99)
Potassium: 4.6 mmol/L (ref 3.5–5.1)
Sodium: 133 mmol/L — ABNORMAL LOW (ref 135–145)
Total Bilirubin: 0.6 mg/dL (ref 0.3–1.2)
Total Protein: 5.7 g/dL — ABNORMAL LOW (ref 6.5–8.1)

## 2022-12-03 LAB — GLUCOSE, CAPILLARY
Glucose-Capillary: 244 mg/dL — ABNORMAL HIGH (ref 70–99)
Glucose-Capillary: 307 mg/dL — ABNORMAL HIGH (ref 70–99)

## 2022-12-03 LAB — MAGNESIUM: Magnesium: 1.7 mg/dL (ref 1.7–2.4)

## 2022-12-03 MED ORDER — INSULIN GLARGINE-YFGN 100 UNIT/ML ~~LOC~~ SOLN
40.0000 [IU] | Freq: Every day | SUBCUTANEOUS | Status: DC
  Administered 2022-12-03: 40 [IU] via SUBCUTANEOUS
  Filled 2022-12-03: qty 0.4

## 2022-12-03 MED ORDER — INSULIN ASPART 100 UNIT/ML IJ SOLN
10.0000 [IU] | Freq: Three times a day (TID) | INTRAMUSCULAR | Status: DC
  Administered 2022-12-03 (×2): 10 [IU] via SUBCUTANEOUS

## 2022-12-03 MED ORDER — CEFADROXIL 500 MG PO CAPS
500.0000 mg | ORAL_CAPSULE | Freq: Two times a day (BID) | ORAL | Status: DC
  Filled 2022-12-03: qty 1

## 2022-12-03 MED ORDER — TRAZODONE HCL 50 MG PO TABS: 50.0000 mg | ORAL_TABLET | Freq: Every evening | ORAL | 0 refills | Status: DC | PRN

## 2022-12-03 MED ORDER — INSULIN ASPART 100 UNIT/ML IJ SOLN
0.0000 [IU] | Freq: Three times a day (TID) | INTRAMUSCULAR | Status: DC
  Administered 2022-12-03: 8 [IU] via SUBCUTANEOUS

## 2022-12-03 MED ORDER — TOUJEO SOLOSTAR 300 UNIT/ML ~~LOC~~ SOPN: 40.0000 [IU] | PEN_INJECTOR | Freq: Every day | SUBCUTANEOUS | 3 refills | Status: DC

## 2022-12-03 MED ORDER — CEFADROXIL 500 MG PO CAPS: 500.0000 mg | ORAL_CAPSULE | Freq: Two times a day (BID) | ORAL | 0 refills | Status: DC

## 2022-12-03 MED ORDER — INSULIN LISPRO (1 UNIT DIAL) 100 UNIT/ML (KWIKPEN): 10.0000 [IU] | PEN_INJECTOR | Freq: Three times a day (TID) | SUBCUTANEOUS | 3 refills | Status: DC

## 2022-12-03 MED ORDER — CEFADROXIL 500 MG PO CAPS: 1000.0000 mg | ORAL_CAPSULE | Freq: Two times a day (BID) | ORAL | Status: DC

## 2022-12-03 MED ORDER — ALBUTEROL SULFATE (2.5 MG/3ML) 0.083% IN NEBU
2.5000 mg | INHALATION_SOLUTION | RESPIRATORY_TRACT | Status: DC | PRN
  Administered 2022-12-03: 2.5 mg via RESPIRATORY_TRACT
  Filled 2022-12-03: qty 3

## 2022-12-03 NOTE — TOC Progression Note (Signed)
Transition of Care [facility]) - Progression Note    Patient Details  Name: [name] MRN: [id number] Date of Birth: 1934/10/17  Transition [facility]) CM/SW Contact  Purcell Mouton, RN Phone Number: 12/03/2022, 9:53 AM  Clinical Narrative:     Spoke with pt's daughter Vaughan Basta concerning discharge plans. Vaughan Basta is concerned about pt's care at home. Vaughan Basta states pt, states he is weak, have not been OOB while in [place] continued that her stepmother will not allow her to see pt's medications or allow anyone into the home for [facility]. Explained to daughter Vaughan Basta that she may call APS in her area. MD ordered PT for eval.      Expected Discharge Plan and Services                                               Social Determinants of Health (SDOH) Interventions SDOH Screenings   Food Insecurity: No Food Insecurity (11/30/2022)  Housing: Low Risk  (11/30/2022)  Transportation Needs: No Transportation Needs (11/30/2022)  Utilities: Not At Risk (11/30/2022)  Tobacco Use: Low Risk  (11/30/2022)    Readmission Risk Interventions    08/30/2021   11:52 AM 06/12/2021   12:39 PM 06/12/2021   10:43 AM  Readmission Risk Prevention Plan  Transportation Screening Complete Complete   PCP or Specialist Appt within 3-5 Days Complete  Complete  HRI or Home Care Consult Complete  Complete  Social Work Consult for [place] Planning/Counseling Complete  Complete  Palliative Care Screening Not Applicable  Not Applicable  Medication Review Press photographer) Complete  Complete

## 2022-12-03 NOTE — Discharge Instructions (Addendum)
Please take all prescribed medications including your course of antibiotics exactly as instructed. Your artificial urethral sphincter is currently nonfunctional.  In the meantime, you must wear an incontinence product (such as Depends) your choosing to avoid soiling your close.  Please exchange these incontinence products at the time they are soiled so as not to cause any further infectious complications or skin breakdown. Please follow-up with your outpatient urologist Dr. Odis Luster in approximately 1 to 2 weeks Please follow-up with your oncologist Dr. Delton Coombes during your regularly scheduled appointment time on 3/13 Please follow-up with your primary care provider in the next 1 to 2 weeks. Please inquire with your outpatient provider about getting referred for neurocognitive testing.  This will help to assess your memory and determine if there is any degree of impairment that may require treatment.  You may inquire with your primary care provider about this test. Please return to the emergency department if you develop worsening abdominal pain, weakness, confusion, fever or inability to tolerate oral intake. You may reach out to Dr. Aaron Edelman [facility] Medical Director at email: [email or web address] with any nonurgent question.

## 2022-12-03 NOTE — Discharge Summary (Addendum)
Physician Discharge Summary   Patient: [name] MRN: [id number] DOB: 11/12/1934  Admit date:     11/30/2022  Discharge date: 12/03/22  Discharge Physician: Vernelle Emerald   PCP: Celene Squibb, MD   Recommendations at discharge:    Please take all prescribed medications including your course of antibiotics exactly as instructed. Your artificial urethral sphincter is currently nonfunctional.  In the meantime, you must wear an incontinence product (such as Depends) your choosing to avoid soiling your close.  Please exchange these incontinence products at the time they are soiled so as not to cause any further infectious complications or skin breakdown. Please follow-up with your outpatient urologist Dr. Odis Luster in approximately 1 to 2 weeks Please follow-up with your oncologist Dr. Delton Coombes during your regularly scheduled appointment time on 3/13 Please follow-up with your primary care provider in the next 1 to 2 weeks. Please inquire with your outpatient provider about getting referred for neurocognitive testing.  This will help to assess your memory and determine if there is any degree of impairment that may require treatment.  You may inquire with your primary care provider about this test. Please return to the emergency department if you develop worsening abdominal pain, weakness, confusion, fever or inability to tolerate oral intake.  Discharge Diagnoses: Principal Problem:   Complicated UTI (urinary tract infection) due to artifical urinary sphincter Active Problems:   Sepsis due to Klebsiella ([place])   Acute metabolic encephalopathy   Uncontrolled type 2 diabetes mellitus with hyperglycemia, with long-term current use of insulin (HCC)   Chronic kidney disease, stage 3a ([place])   Primary lung adenocarcinoma, left (HCC)   Essential hypertension   Mixed diabetic hyperlipidemia associated with type 2 diabetes mellitus (HCC)   GERD without esophagitis   CAD (coronary artery  disease)   OSA (obstructive sleep apnea)   Mantle cell lymphoma (HCC)   Prostate cancer (HCC)   Leukopenia   Thrombocytopenia, unspecified (HCC)   Mild cognitive impairment   Diabetic peripheral neuropathy (HCC)   Sepsis ([place])  Resolved Problems:   * No resolved hospital problems. [facility] Course: 88 y.o. male with extensive cancer history significant for mantle cell lymphoma (Dx 08/2013 S/P chemotherapy, follows with Dr. 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  Patient additionally has a history of hypertension, OSA, B12 deficiency, history of CAD, GERD, CKD stage IIIa, hyperlipidemia, peripheral vascular disease, insulin-dependent type 2 diabetes, and artificial urethral sphincter placement at [facility] 05/2022 (follows with Dr. Natasha Mead at [facility]).  Patient presented to the [facility] ED with difficulty urinating as well as generalized weakness .  Upon evaluation in the emergency department patient was felt to be suffering from sepsis secondary to urinary tract infection.  Due to the patient's artificial urinary sphincter that was placed 05/2022, EDP originally discussed case with urology at [facility].  Unfortunately, they did not have any beds and as a result were unable to accept and ED to ED transfer.  EDP then discussed case with Dr. Tresa Moore with urology who agreed to see patient in consultation upon transfer to [facility].  The hospitalist group was then called to assess the patient for admission and transfer to [facility].  Patient was initiated on intravenous antibiotic therapy with vancomycin and ceftriaxone.  Blood and urine cultures were obtained.  Upon evaluation on the medical floor  on arrival to [facility] urology recommended Foley catheter placement and after long  discussion patient declined this option.  Case was discussed with covering urologist Dr. Bernarda Caffey at [facility] health who recommended ensuring that the patient's artificial urethral catheter was deactivated while the patient was treated for his underlying complicated urinary tract infection which may be secondary to the presence of his artifical urinary sphincter.  Our local urologist felt the patient's device was essentially nonoperational due to the fact that serial bladder scans revealed that the patient was adequately emptying his bladder.  After several days of intravenous antibiotics, patient exhibited improving lethargy and confusion with mentation approaching baseline.  Severe hyperglycemia that the patient initially presented with also dramatically improved as well with titration of basal bolus insulin therapy.    On 3/9, urine cultures grew out klebsiella pneumonia.  Antibiotic therapy was then de-escalated to oral Duricef with urology recommending a total of a 14-day course of therapy.  Patient was discharged home in improved and stable condition on 12/03/2022 having been advised to follow-up closely as an outpatient with his urologist Dr. Odis Luster and his oncologist Dr. Delton Coombes on 3/13.      Consultants: Dr. Tresa Moore with Urology Procedures performed: None  Disposition: Home Diet recommendation:  Discharge Diet Orders (From admission, onward)     Start     Ordered   12/03/22 0000  Diet - low sodium heart healthy        12/03/22 1440           Cardiac and Carb modified diet  DISCHARGE MEDICATION: Allergies as of 12/03/2022       Reactions   Bee Venom Anaphylaxis   Iodine    Pt does not remember reaction Other Reaction(s): GI Intolerance, Hypotension, Other (See Comments) syncope   Adhesive [tape] Hives   Ciprofloxacin    Pt does not remember reaction    Povidone-iodine    Pt does not remember reaction    Simvastatin    Pt does not remember reaction     Codeine    Pt does not remember reaction Other Reaction(s): GI Intolerance   Latex Rash        Medication List     STOP taking these medications    doxycycline 100 MG capsule Commonly known as: VIBRAMYCIN   insulin aspart 100 UNIT/ML injection Commonly known as: novoLOG   levocetirizine 5 MG tablet Commonly known as: XYZAL   metFORMIN 750 MG 24 hr tablet Commonly known as: GLUCOPHAGE-XR   Myrbetriq 25 MG Tb24 tablet Generic drug: mirabegron ER   sulfamethoxazole-trimethoprim 800-160 MG tablet Commonly known as: BACTRIM DS       TAKE these medications    acetaminophen 325 MG tablet Commonly known as: TYLENOL Take 650 mg by mouth every 6 (six) hours as needed for mild pain or moderate pain.   albuterol 108 (90 Base) MCG/ACT inhaler Commonly known as: ProAir HFA Inhale 2 puffs into the lungs at bedtime as needed for wheezing or shortness of breath.   amLODipine 10 MG tablet Commonly known as: NORVASC Take 1 tablet (10 mg total) by mouth daily. What changed: how much to take   aspirin EC 81 MG tablet Take 81 mg by mouth daily.   atorvastatin 40 MG tablet Commonly known as: LIPITOR Take 40 mg by mouth daily.   blood glucose meter kit and supplies Kit Dispense based on patient and insurance preference. Use at least three times a day to check BS levels and  fluctuation.   budesonide-formoterol 160-4.5 MCG/ACT inhaler Commonly known as: SYMBICORT Inhale 2 puffs into the lungs 2 (two) times daily.   cefadroxil 500 MG capsule Commonly known as: DURICEF Take 1 capsule (500 mg total) by mouth 2 (two) times daily.   dextromethorphan-guaiFENesin 30-600 MG 12hr tablet Commonly known as: MUCINEX DM Take 1 tablet by mouth 2 (two) times daily as needed for cough.   dronabinol 5 MG capsule Commonly known as: MARINOL Take 1 capsule (5 mg total) by mouth 2 (two) times daily before a meal.   fluticasone 50 MCG/ACT nasal spray Commonly known as: FLONASE Place 1  spray into both nostrils as needed for allergies or rhinitis.   folic acid 1 MG tablet Commonly known as: FOLVITE Take 1 tablet (1 mg total) by mouth daily.   HYDROmorphone 2 MG tablet Commonly known as: DILAUDID Take 2 mg by mouth every 6 (six) hours as needed for moderate pain.   insulin lispro 100 UNIT/ML KwikPen Commonly known as: HumaLOG KwikPen Inject 10 Units into the skin 3 (three) times daily. 4-8 units What changed:  how much to take when to take this   LORazepam 0.5 MG tablet Commonly known as: ATIVAN Take 0.5 mg by mouth at bedtime as needed for sleep.   meclizine 25 MG tablet Commonly known as: ANTIVERT Take 1 tablet (25 mg total) by mouth 3 (three) times daily as needed for dizziness.   megestrol 400 MG/10ML suspension Commonly known as: MEGACE Take 10 mLs (400 mg total) by mouth 2 (two) times daily.   metoCLOPramide 10 MG tablet Commonly known as: REGLAN Take 1 tablet (10 mg total) by mouth every 6 (six) hours as needed for nausea.   nystatin powder Commonly known as: MYCOSTATIN/NYSTOP Apply topically.   oxyCODONE-acetaminophen 5-325 MG tablet Commonly known as: PERCOCET/ROXICET   pantoprazole 40 MG tablet Commonly known as: PROTONIX Take 40 mg by mouth 2 (two) times daily.   polyethylene glycol powder 17 GM/SCOOP powder Commonly known as: GLYCOLAX/MIRALAX Take by mouth.   prochlorperazine 10 MG tablet Commonly known as: COMPAZINE Take 1 tablet (10 mg total) by mouth every 6 (six) hours as needed for nausea or vomiting.   propranolol ER 80 MG 24 hr capsule Commonly known as: INDERAL LA Take 1 capsule (80 mg total) by mouth at bedtime.   testosterone cypionate 200 MG/ML injection Commonly known as: DEPOTESTOSTERONE CYPIONATE Inject 200 mg into the muscle every 14 (fourteen) days.   Toujeo SoloStar 300 UNIT/ML Solostar Pen Generic drug: insulin glargine (1 Unit Dial) Inject 40 Units into the skin daily. What changed: how much to take    traZODone 50 MG tablet Commonly known as: DESYREL Take 1 tablet (50 mg total) by mouth at bedtime as needed for sleep.        Follow-up Information     Celene Squibb, MD. Schedule an appointment as soon as possible for a visit in 2 week(s).   Specialty: Internal Medicine Contact information: [address] [place] [facility] [address] [phone]         Lorenza Evangelist, MD. Schedule an appointment as soon as possible for a visit in 14 day(s).   Specialty: Urology Why: Schedule an appointment in the next 1 to 2 weeks. Contact information: [place] [address] [id number]                 Discharge Exam: Filed Weights   11/30/22 0735 11/30/22 1459  Weight: 90.7 kg 91.5 kg  Constitutional: Awake alert and oriented x3, no associated distress.   Respiratory: clear to auscultation bilaterally, no wheezing, no crackles. Normal respiratory effort. No accessory muscle use.  Cardiovascular: Regular rate and rhythm, no murmurs / rubs / gallops. No extremity edema. 2+ pedal pulses. No carotid bruits.  Abdomen: Abdomen is soft and nontender.  No evidence of intra-abdominal masses.  Positive bowel sounds noted in all quadrants.   Musculoskeletal: No joint deformity upper and lower extremities. Good ROM, no contractures. Normal muscle tone.     Condition at discharge: fair  The results of significant diagnostics from this hospitalization (including imaging, microbiology, ancillary and laboratory) are listed below for reference.   Imaging Studies: DG Chest Port 1 View  Result Date: 11/30/2022 CLINICAL DATA:  Possible sepsis. EXAM: PORTABLE CHEST 1 VIEW COMPARISON:  11/12/2022 FINDINGS: RIGHT power port tip overlies the superior vena cava. Low lung volume. Heart size is normal. There is patchy opacity in the LEFT mid lung zone and LEFT lung base. LEFT hilar mass is better seen on prior studies. RIGHT lung is clear. IMPRESSION: 1. Low lung volume. 2.  LEFT mid lung zone and LEFT LOWER lobe atelectasis or minimal infiltrate. Electronically Signed   By: Nolon Nations M.D.   On: 11/30/2022 08:46   NM PET Image Initial (PI) Skull Base To Thigh  Addendum Date: 11/14/2022   ADDENDUM REPORT: 11/14/2022 08:36 ADDENDUM: Hypermetabolic activity in the acromion process of the RIGHT scapula. Underlying lytic lesion on CT portion exam. Findings consistent with additional skeletal metastasis. Findings conveyed toSREEDHAR KATRAGADDA on 11/14/2022  at08:36. Electronically Signed   By: Suzy Bouchard M.D.   On: 11/14/2022 08:36   Result Date: 11/14/2022 CLINICAL DATA:  Subsequent treatment strategy for non-small cell lung cancer. EXAM: NUCLEAR MEDICINE PET SKULL BASE TO THIGH TECHNIQUE: 11.1 mCi F-18 FDG was injected intravenously. Full-ring PET imaging was performed from the skull base to thigh after the radiotracer. CT data was obtained and used for attenuation correction and anatomic localization. Fasting blood glucose: 195 mg/dl COMPARISON:  CT chest abdomen pelvis 10/26/2022 FINDINGS: Mediastinal blood pool activity: SUV max 3.3 Liver activity: SUV max NA NECK: Intensely hypermetabolic and enlarged LEFT supraclavicular lymph node measures 2.2 cm with SUV max equal 11.1 (image 66) Incidental CT findings: None. CHEST: Hypermetabolic LEFT lower paratracheal, subcarinal and hilar adenopathy. Example subcarinal node measures 2 cm SUV max equal 11.3. Bulky LEFT infrahilar node measures 3 cm with SUV max equal 9.0 on image 102). Solitary hypermetabolic nodule in the LEFT lower lobe measures 1.2 cm with SUV max equal 2.6 on image 96) Incidental CT findings: None. ABDOMEN/PELVIS: Hypermetabolic RIGHT adrenal gland with SUV max equal 8.0 (image 150). A small nodule on CT portion exam measures 10 mm. Rounded LEFT adrenal gland with SUV max equal 5.6 is also suspicious for metastasis. No abnormal metabolic activity liver. No hypermetabolic abdominopelvic lymph nodes.  Hypermetabolic activity through the proximal rectum with SUV max equal 8.0. No clear CT lesion. Skeletal: Hypermetabolic lesion proximal LEFT femur just below the lesser trochanter with SUV max equal 5.6 on image 277. Focal uptake in the anterior LEFT chest wall at at the junction of the first rib and manubrium with SUV max equal 8.3 is favored skeletal metastasis. Focal uptake in the LEFT transverse process of the T3 vertebral body (image 73) Lateral LEFT rib lesion on image [id number] is hypermetabolic. IMPRESSION: 1. Hypermetabolic LEFT hilar and mediastinal metastatic adenopathy. Large hypermetabolic metastatic LEFT supraclavicular lymph node. Favor lung cancer metastasis. 2. Hypermetabolic  nodule within the LEFT lower lobe representing a metastatic lesion or primary bronchogenic carcinoma. 3. Bilateral hypermetabolic adrenal metastasis. 4. Several skeletal hypermetabolic metastasis. Lesions in the proximal LEFT femur, T3 transverse process, and several LEFT ribs. Electronically Signed: By: Suzy Bouchard M.D. On: 11/07/2022 12:57   DG Chest 2 View  Result Date: 11/12/2022 CLINICAL DATA:  Suspected sepsis. EXAM: CHEST - 2 VIEW COMPARISON:  Chest radiographs 12/25/2022, 10/09/2021, 09/03/2021, 05/19/2021; CT chest 10/26/2022. FINDINGS: Right chest wall porta catheter with tip overlying the central superior vena cava, new from prior. Moderately decreased lung volumes are mildly improved from prior. Cardiac silhouette is at the upper limits of normal size. There is enlargement of the left hilum which appears more prominent than on prior 10/26/2022 and 10/09/2021 radiographs indefinitely new from 05/19/2021 radiographs. This corresponds to the mass versus conglomerate adenopathy seen on 10/26/2022 chest CT. The 1.4 cm spiculated nodule within the superior segment of the left lower lobe seen on recent 10/26/2022 CT is not well visualized on radiographs. There is left lower lung horizontal linear interstitial  thickening that appears unchanged from multiple prior radiographs. No pleural effusion or pneumothorax. Mild-to-moderate multilevel degenerative disc changes of the thoracic spine. IMPRESSION: 1. Enlargement of the left hilum corresponds to the mass versus conglomerate adenopathy seen on 10/26/2022 chest CT. 2. The 1.4 cm spiculated nodule within the superior segment of the left lower lobe seen on recent 10/26/2022 CT is not well visualized on radiographs. 3. Mildly increased interstitial thickening within the left lower lung appears similar to multiple prior radiographs and may represent scarring versus subsegmental atelectasis. It is difficult to exclude early pneumonia. Electronically Signed   By: Yvonne Kendall M.D.   On: 11/12/2022 10:34   DG Shoulder Right  Result Date: 11/11/2022 CLINICAL DATA:  Right shoulder pain and decreased range of motion. EXAM: RIGHT SHOULDER - 2+ VIEW COMPARISON:  Right shoulder radiographs 10/12/2021 FINDINGS: No acute fracture or dislocation is identified. Moderate acromioclavicular osteoarthrosis is again noted. Calcifications are again seen adjacent to the greater tuberosity of the humerus suggestive of calcific rotator cuff tendinopathy. A right chest Port-A-Cath is in place. IMPRESSION: 1. No acute osseous abnormality. 2. Moderate acromioclavicular osteoarthrosis. 3. Suspected calcific rotator cuff tendinopathy. Electronically Signed   By: Logan Bores M.D.   On: 11/11/2022 13:13   IR IMAGING GUIDED PORT INSERTION  Result Date: 11/05/2022 INDICATION: 88 year old male with lung carcinoma referred for port catheter and biopsy of left supraclavicular lymph node EXAM: IMPLANTED PORT A CATH PLACEMENT WITH ULTRASOUND AND FLUOROSCOPIC GUIDANCE IMAGE GUIDED BIOPSY OF LEFT SUPRACLAVICULAR LYMPH NODE MEDICATIONS: None ANESTHESIA/SEDATION: Moderate (conscious) sedation was employed during this procedure. A total of Versed 2.0 mg and Fentanyl 100 mcg was administered intravenously by  the radiology nurse. Total intra-service moderate Sedation Time: 38 minutes. The patient's level of consciousness and vital signs were monitored continuously by radiology nursing throughout the procedure under my direct supervision. FLUOROSCOPY: Radiation Exposure Index (as provided by the fluoroscopic device): 1 mGy Kerma COMPLICATIONS: None PROCEDURE: The procedure, risks, benefits, and alternatives were explained to the patient. Questions regarding the procedure were encouraged and answered. The patient understands and consents to the procedure. Ultrasound survey was performed of the left supraclavicular region with images stored and sent to PACs. The left neck was prepped with chlorhexidine in a sterile fashion, and a sterile drape was applied covering the operative field. A sterile gown and sterile gloves were used for the procedure. Local anesthesia was provided with 1% Lidocaine. Ultrasound guidance  was used to infiltrate the region with 1% lidocaine for local anesthesia. Four separate 18 gauge core biopsy were then acquired of the pathologic left supraclavicular lymph node using ultrasound guidance. Images were stored. Tissue placed into fresh specimen. Final image was stored after biopsy. We then proceeded with port catheter. The patient was prepped and draped in the usual sterile fashion. 1% lidocaine was used for local anesthesia. Ultrasound survey was performed with images stored and sent to PACs. Right IJ vein documented to be patent. The right neck and chest was prepped with chlorhexidine, and draped in the usual sterile fashion using maximum barrier technique (cap and mask, sterile gown, sterile gloves, large sterile sheet, hand hygiene and cutaneous antiseptic). Local anesthesia was attained by infiltration with 1% lidocaine without epinephrine. Ultrasound demonstrated patency of the right internal jugular vein, and this was documented with an image. Under real-time ultrasound guidance, this vein was  accessed with a 21 gauge micropuncture needle and image documentation was performed. A small dermatotomy was made at the access site with an 11 scalpel. A 0.018" wire was advanced into the SVC and used to estimate the length of the internal catheter. The access needle exchanged for a 62F micropuncture vascular sheath. The 0.018" wire was then removed and a 0.035" wire advanced into the IVC. An appropriate location for the subcutaneous reservoir was selected below the clavicle and an incision was made through the skin and underlying soft tissues. The subcutaneous tissues were then dissected using a combination of blunt and sharp surgical technique and a pocket was formed. A single lumen power injectable portacatheter was then tunneled through the subcutaneous tissues from the pocket to the dermatotomy and the port reservoir placed within the subcutaneous pocket. The venous access site was then serially dilated and a peel away vascular sheath placed over the wire. The wire was removed and the port catheter advanced into position under fluoroscopic guidance. The catheter tip is positioned in the cavoatrial junction. This was documented with a spot image. The portacatheter was then tested and found to flush and aspirate well. The port was flushed with saline followed by 100 units/mL heparinized saline. The pocket was then closed in two layers using first subdermal inverted interrupted absorbable sutures followed by a running subcuticular suture. The epidermis was then sealed with Dermabond. The dermatotomy at the venous access site was also seal with Dermabond. Patient tolerated the procedure well and remained hemodynamically stable throughout. No complications encountered and no significant blood loss encountered IMPRESSION: Status post image guided biopsy of left supraclavicular pathologic lymph node, as well as right IJ port catheter. Signed, Dulcy Fanny. Nadene Rubins, RPVI Vascular and Interventional Radiology  [facility] Radiology Electronically Signed   By: Corrie Mckusick D.O.   On: 11/05/2022 16:41   IR US Guide Bx Asp/Drain  Result Date: 11/05/2022 INDICATION: 88 year old male with lung carcinoma referred for port catheter and biopsy of left supraclavicular lymph node EXAM: IMPLANTED PORT A CATH PLACEMENT WITH ULTRASOUND AND FLUOROSCOPIC GUIDANCE IMAGE GUIDED BIOPSY OF LEFT SUPRACLAVICULAR LYMPH NODE MEDICATIONS: None ANESTHESIA/SEDATION: Moderate (conscious) sedation was employed during this procedure. A total of Versed 2.0 mg and Fentanyl 100 mcg was administered intravenously by the radiology nurse. Total intra-service moderate Sedation Time: 38 minutes. The patient's level of consciousness and vital signs were monitored continuously by radiology nursing throughout the procedure under my direct supervision. FLUOROSCOPY: Radiation Exposure Index (as provided by the fluoroscopic device): 1 mGy Kerma COMPLICATIONS: None PROCEDURE: The procedure, risks, benefits, and alternatives  were explained to the patient. Questions regarding the procedure were encouraged and answered. The patient understands and consents to the procedure. Ultrasound survey was performed of the left supraclavicular region with images stored and sent to PACs. The left neck was prepped with chlorhexidine in a sterile fashion, and a sterile drape was applied covering the operative field. A sterile gown and sterile gloves were used for the procedure. Local anesthesia was provided with 1% Lidocaine. Ultrasound guidance was used to infiltrate the region with 1% lidocaine for local anesthesia. Four separate 18 gauge core biopsy were then acquired of the pathologic left supraclavicular lymph node using ultrasound guidance. Images were stored. Tissue placed into fresh specimen. Final image was stored after biopsy. We then proceeded with port catheter. The patient was prepped and draped in the usual sterile fashion. 1% lidocaine was used for  local anesthesia. Ultrasound survey was performed with images stored and sent to PACs. Right IJ vein documented to be patent. The right neck and chest was prepped with chlorhexidine, and draped in the usual sterile fashion using maximum barrier technique (cap and mask, sterile gown, sterile gloves, large sterile sheet, hand hygiene and cutaneous antiseptic). Local anesthesia was attained by infiltration with 1% lidocaine without epinephrine. Ultrasound demonstrated patency of the right internal jugular vein, and this was documented with an image. Under real-time ultrasound guidance, this vein was accessed with a 21 gauge micropuncture needle and image documentation was performed. A small dermatotomy was made at the access site with an 11 scalpel. A 0.018" wire was advanced into the SVC and used to estimate the length of the internal catheter. The access needle exchanged for a 31F micropuncture vascular sheath. The 0.018" wire was then removed and a 0.035" wire advanced into the IVC. An appropriate location for the subcutaneous reservoir was selected below the clavicle and an incision was made through the skin and underlying soft tissues. The subcutaneous tissues were then dissected using a combination of blunt and sharp surgical technique and a pocket was formed. A single lumen power injectable portacatheter was then tunneled through the subcutaneous tissues from the pocket to the dermatotomy and the port reservoir placed within the subcutaneous pocket. The venous access site was then serially dilated and a peel away vascular sheath placed over the wire. The wire was removed and the port catheter advanced into position under fluoroscopic guidance. The catheter tip is positioned in the cavoatrial junction. This was documented with a spot image. The portacatheter was then tested and found to flush and aspirate well. The port was flushed with saline followed by 100 units/mL heparinized saline. The pocket was then closed  in two layers using first subdermal inverted interrupted absorbable sutures followed by a running subcuticular suture. The epidermis was then sealed with Dermabond. The dermatotomy at the venous access site was also seal with Dermabond. Patient tolerated the procedure well and remained hemodynamically stable throughout. No complications encountered and no significant blood loss encountered IMPRESSION: Status post image guided biopsy of left supraclavicular pathologic lymph node, as well as right IJ port catheter. Signed, Dulcy Fanny. Nadene Rubins, RPVI [facility] Radiology Electronically Signed   By: Corrie Mckusick D.O.   On: 11/05/2022 16:41    Microbiology: Results for orders placed or performed during the hospital encounter of 11/30/22  Blood Culture (routine x 2)     Status: None (Preliminary result)   Collection Time: 11/30/22  7:44 AM   Specimen: BLOOD LEFT WRIST  Result Value  Ref Range Status   Specimen Description   Final    BLOOD LEFT WRIST BOTTLES DRAWN AEROBIC AND ANAEROBIC   Special Requests   Final    Blood Culture results may not be optimal due to an excessive volume of blood received in culture bottles   Culture   Final    NO GROWTH 2 DAYS Performed at [facility], [address] [place], [place] [address]    Report Status PENDING  Incomplete  Blood Culture (routine x 2)     Status: None (Preliminary result)   Collection Time: 11/30/22  7:49 AM   Specimen: BLOOD LEFT FOREARM  Result Value Ref Range Status   Specimen Description   Final    BLOOD LEFT FOREARM BOTTLES DRAWN AEROBIC AND ANAEROBIC   Special Requests   Final    Blood Culture results may not be optimal due to an excessive volume of blood received in culture bottles   Culture   Final    NO GROWTH 2 DAYS Performed at [facility], [address], [place], [place] [address]    Report Status PENDING  Incomplete  Resp panel by RT-PCR (RSV, Flu A&B, Covid) Anterior  Nasal Swab     Status: None   Collection Time: 11/30/22  8:26 AM   Specimen: Anterior Nasal Swab  Result Value Ref Range Status   SARS Coronavirus 2 by RT PCR NEGATIVE NEGATIVE Final    Comment: (NOTE) SARS-CoV-2 target nucleic acids are NOT DETECTED.  The SARS-CoV-2 RNA is generally detectable in upper respiratory specimens during the acute phase of infection. The lowest concentration of SARS-CoV-2 viral copies this assay can detect is 138 copies/mL. A negative result does not preclude SARS-Cov-2 infection and should not be used as the sole basis for treatment or other patient management decisions. A negative result may occur with  improper specimen collection/handling, submission of specimen other than nasopharyngeal swab, presence of viral mutation(s) within the areas targeted by this assay, and inadequate number of viral copies(<138 copies/mL). A negative result must be combined with clinical observations, patient history, and epidemiological information. The expected result is Negative.  Fact Sheet for Patients:  EntrepreneurPulse.com.au  Fact Sheet for Healthcare Providers:  IncredibleEmployment.be  This test is no t yet approved or cleared by the [place] FDA and  has been authorized for detection and/or diagnosis of SARS-CoV-2 by FDA under an Emergency Use Authorization (EUA). This EUA will remain  in effect (meaning this test can be used) for the duration of the COVID-19 declaration under Section 564(b)(1) of the Act, 21 U.S.C.section 360bbb-3(b)(1), unless the authorization is terminated  or revoked sooner.       Influenza A by PCR NEGATIVE NEGATIVE Final   Influenza B by PCR NEGATIVE NEGATIVE Final    Comment: (NOTE) The Xpert Xpress SARS-CoV-2/FLU/RSV plus assay is intended as an aid in the diagnosis of influenza from Nasopharyngeal swab specimens and should not be used as a sole basis for treatment. Nasal washings  and aspirates are unacceptable for Xpert Xpress SARS-CoV-2/FLU/RSV testing.  Fact Sheet for Patients: EntrepreneurPulse.com.au  Fact Sheet for Healthcare Providers: IncredibleEmployment.be  This test is not yet approved or cleared by the [place] FDA and has been authorized for detection and/or diagnosis of SARS-CoV-2 by FDA under an Emergency Use Authorization (EUA). This EUA will remain in effect (meaning this test can be used) for the duration of the COVID-19 declaration under Section 564(b)(1) of the Act, 21 U.S.C. section 360bbb-3(b)(1), unless the authorization is terminated  or revoked.     Resp Syncytial Virus by PCR NEGATIVE NEGATIVE Final    Comment: (NOTE) Fact Sheet for Patients: EntrepreneurPulse.com.au  Fact Sheet for Healthcare Providers: IncredibleEmployment.be  This test is not yet approved or cleared by the [place] FDA and has been authorized for detection and/or diagnosis of SARS-CoV-2 by FDA under an Emergency Use Authorization (EUA). This EUA will remain in effect (meaning this test can be used) for the duration of the COVID-19 declaration under Section 564(b)(1) of the Act, 21 U.S.C. section 360bbb-3(b)(1), unless the authorization is terminated or revoked.  Performed at [facility], [address], [place], [place] [address]   Urine Culture (for pregnant, neutropenic or urologic patients or patients with an indwelling urinary catheter)     Status: Abnormal   Collection Time: 11/30/22  8:36 AM   Specimen: Urine, Catheterized  Result Value Ref Range Status   Specimen Description   Final    URINE, CATHETERIZED Performed at [facility], [address] [place], [place] [address]    Special Requests   Final    NONE Performed at [facility], [address] [place], [place] [address]    Culture >=100,000 COLONIES/mL KLEBSIELLA PNEUMONIAE (A)  Final   Report Status  12/02/2022 FINAL  Final   Organism ID, Bacteria KLEBSIELLA PNEUMONIAE (A)  Final      Susceptibility   Klebsiella pneumoniae - MIC*    AMPICILLIN RESISTANT Resistant     CEFAZOLIN <=4 SENSITIVE Sensitive     CEFEPIME <=0.12 SENSITIVE Sensitive     CEFTRIAXONE <=0.25 SENSITIVE Sensitive     CIPROFLOXACIN <=0.25 SENSITIVE Sensitive     GENTAMICIN <=1 SENSITIVE Sensitive     IMIPENEM <=0.25 SENSITIVE Sensitive     NITROFURANTOIN 64 INTERMEDIATE Intermediate     TRIMETH/SULFA >=320 RESISTANT Resistant     AMPICILLIN/SULBACTAM 4 SENSITIVE Sensitive     PIP/TAZO <=4 SENSITIVE Sensitive     * >=100,000 COLONIES/mL KLEBSIELLA PNEUMONIAE    Labs: CBC: Recent Labs  Lab 11/30/22 0744 12/01/22 0424 12/02/22 0332 12/03/22 0359  WBC 3.6* 4.0 4.1 4.0  NEUTROABS 2.7  --  3.0 3.1  HGB 8.9* 8.3* 8.7* 8.1*  HCT 27.5* 26.0* 28.5* 26.0*  MCV 90.2 93.2 94.7 93.9  PLT 113* 114* 117* [id number]*   Basic Metabolic Panel: Recent Labs  Lab 11/30/22 0744 12/01/22 0424 12/02/22 0332 12/03/22 0359  NA 126* 130* 129* 133*  K 4.0 4.4 4.7 4.6  CL 89* 94* 95* 100  CO2 23 27 24 22   GLUCOSE 549* 388* 360* 291*  BUN 35* 38* 48* 43*  CREATININE 1.42* 1.62* 1.77* 1.65*  CALCIUM 8.8* 8.5* 8.5* 8.5*  MG  --  1.8 1.9 1.7   Liver Function Tests: Recent Labs  Lab 11/30/22 0744 12/02/22 0332 12/03/22 0359  AST 16 12* 12*  ALT 17 12 13   ALKPHOS 90 72 66  BILITOT 1.0 0.6 0.6  PROT 6.8 6.1* 5.7*  ALBUMIN 3.4* 2.9* 2.7*   CBG: Recent Labs  Lab 12/02/22 1144 12/02/22 1610 12/02/22 2014 12/03/22 0729 12/03/22 1127  GLUCAP 376* 244* 243* 244* 307*    Discharge time spent: greater than 30 minutes.  Signed: Vernelle Emerald, MD [facility] 12/03/2022

## 2022-12-03 NOTE — Inpatient Diabetes Management (Signed)
Inpatient Diabetes Program Recommendations  AACE/ADA: New Consensus Statement on Inpatient Glycemic Control (2015)  Target Ranges:  Prepandial:   less than 140 mg/dL      Peak postprandial:   less than 180 mg/dL (1-2 hours)      Critically ill patients:  140 - 180 mg/dL   Lab Results  Component Value Date   GLUCAP 244 (H) 12/03/2022   HGBA1C 7.2 (H) 10/27/2022    Review of Glycemic Control  Diabetes history: DM2 Outpatient Diabetes medications: Toujeo 72 units QAM, Novolog 8-10 units TID Current orders for Inpatient glycemic control: Semglee 40 QD, Novolog 0-10 TID + 10 units TID  HgbA1C - 7.2%  Inpatient Diabetes Program Recommendations:    Add Novolog 0-5 HS  Will likely need increase in Semglee to 50 units QD  Spoke with pt late yesterday afternoon regarding his home meds and diabetes control. HgbA1C of 7.2% looks great. Pt states he takes Toujeo (usually 72 units) every morning, but occasionally takes less if blood sugars are lower than normal. Has hypoglycemia sometimes, and treats with juice/soda. Discussed lifestyle modification with weight management and exercise. Pt states he tries to have portion control with his meals, although admits to food indiscretions. Monitors blood sugars daily. Answered all questions/concerns. To f/u with PCP.   Continue to follow.  Thank you. Lorenda Peck, RD, LDN, [place] Inpatient Diabetes Coordinator [phone]

## 2022-12-03 NOTE — Progress Notes (Signed)
PT Cancellation Note  Patient Details Name: MCCALL BEAUCHESNE MRN: [id number] DOB: 03-23-1935   Cancelled Treatment:    Reason Eval/Treat Not Completed: Patient declined, no reason specified Spoke with OT who just checked on pt and he adamantly declined therapy and mobility.  OT made MD aware.  Will f/u as able. Abran Richard, PT Acute Rehab [facility] [phone]   Karlton Lemon 12/03/2022, 11:12 AM

## 2022-12-03 NOTE — Evaluation (Signed)
Occupational Therapy Evaluation Patient Details Name: [name] MRN: [id number] DOB: Sep 06, 1935 Today's Date: 12/03/2022   History of Present Illness Patient is a 88 year old male who presented to the ED on 3/9 with generalized weakness and difficulty urinating. Patient was admitted with complicated UTI, sepsis, and uncontrolled type II DM with hyperglycemia. PMH: CKD III, primary lung adenocarcinoma, HTN, GERD.   Clinical Impression   Patient is a 89 year old male who was admitted for above. Patient reported being independent at home. Patient was min guard for functional mobility in room with notable furniture walking in room with personal cane. Patient was not receptive to education on ECT or breathing strategies with patient needing encouragement from granddaughter and nursing staff to participate in time out of bed. Patient noted to have decreased standing balance, decreased receptiveness to education, decreased functional activity tolerance and increased need for supplemental o2 impacting participation in ADLs. Patient would continue to benefit from skilled OT services at this time while admitted and after d/c to address noted deficits in order to improve overall safety and independence in ADLs.        Recommendations for follow up therapy are one component of a multi-disciplinary discharge planning process, led by the attending physician.  Recommendations may be updated based on patient status, additional functional criteria and insurance authorization.   Follow Up Recommendations  Home health OT     Assistance Recommended at Discharge Frequent or constant Supervision/Assistance  Patient can return home with the following A little help with walking and/or transfers;Assistance with cooking/housework;Direct supervision/assist for medications management;Assist for transportation;Help with stairs or ramp for entrance;Direct supervision/assist for financial management;A little help with  bathing/dressing/bathroom    Functional Status Assessment  Patient has had a recent decline in their functional status and demonstrates the ability to make significant improvements in function in a reasonable and predictable amount of time.  Equipment Recommendations  None recommended by OT       Precautions / Restrictions Precautions Precautions: Fall Precaution Comments: h/o tremors Restrictions Weight Bearing Restrictions: No      Mobility Bed Mobility Overal bed mobility: Modified Independent          Transfers Overall transfer level: Needs assistance Equipment used: Straight cane Transfers: Sit to/from Stand Sit to Stand: Min guard          Balance Overall balance assessment: Mild deficits observed, not formally tested         ADL either performed or assessed with clinical judgement   ADL Overall ADL's : Needs assistance/impaired Eating/Feeding: Modified independent;Sitting Eating/Feeding Details (indicate cue type and reason): EOB Grooming: Supervision/safety;Sitting   Upper Body Bathing: Set up;Sitting   Lower Body Bathing: Minimal assistance;Sitting/lateral leans   Upper Body Dressing : Set up;Sitting   Lower Body Dressing: Minimal assistance;Sitting/lateral leans   Toilet Transfer: Min guard;Ambulation Toilet Transfer Details (indicate cue type and reason): with personal cane with patient slightly unsteady reaching out to foot of bed and doorframe. patient reported he did not need to have a walker at this time and was not receptive to it. Toileting- Water quality scientist and Hygiene: Min guard;Sit to/from stand       Functional mobility during ADLs: Min guard;Cane General ADL Comments: in hallway with increased time. patient was noted to have shortness of breath with mobility in hallway with personal cane on 2L/min with patient noted to drop down to 87% on 2L/min patient not receptive to breathing strategies reporting" i am changing my breathing  to match  my needs". when patient sat back down, patient was able to return to 92% on 2L/min.      Pertinent Vitals/Pain Pain Assessment Pain Assessment: Faces Faces Pain Scale: Hurts a little bit Pain Location: R shoulder Pain Descriptors / Indicators: Grimacing, Discomfort Pain Intervention(s): Limited activity within patient's tolerance, Monitored during session, Repositioned     Hand Dominance Right   Extremity/Trunk Assessment Upper Extremity Assessment Upper Extremity Assessment: RUE deficits/detail RUE Deficits / Details: pain in R shoulder, reported it has been present for months but "no one has done imaging". declined PROM movement. patient able to lift UE about 20 degrees.   Lower Extremity Assessment Lower Extremity Assessment: Defer to PT evaluation       Communication Communication Communication: No difficulties   Cognition Arousal/Alertness: Awake/alert Behavior During Therapy: Agitated Overall Cognitive Status: Difficult to assess       General Comments: patient was not forthcoming with information. patient's granddaughter was present with patient more receptive to session but not open to orientation questions.                Home Living Family/patient expects to be discharged to:: Private residence Living Arrangements: Children;Spouse/significant other Available Help at Discharge: Family;Available 24 hours/day Type of Home: House Home Access: Stairs to enter [employer] of Steps: 2 Entrance Stairs-Rails: Can reach both Home Layout: Two level         Bathroom Toilet: Standard     Home Equipment: Conservation officer, nature (2 wheels);Cane - single point          Prior Functioning/Environment Prior Level of Function : Independent/Modified Independent               ADLs Comments: "do what i need to" per patient.        OT Problem List: Decreased strength;Decreased activity tolerance;Impaired balance (sitting and/or  standing);Decreased coordination;Decreased safety awareness;Decreased knowledge of precautions;Decreased knowledge of use of DME or AE;Impaired UE functional use;Pain      OT Treatment/Interventions: Self-care/ADL training;Energy conservation;Therapeutic exercise;DME and/or AE instruction;Therapeutic activities;Patient/family education    OT Goals(Current goals can be found in the care plan section) Acute Rehab OT Goals Patient Stated Goal: to go home today OT Goal Formulation: With patient/family Time For Goal Achievement: 12/17/22 Potential to Achieve Goals: Fair  OT Frequency: Min 2X/week       AM-PAC OT "6 Clicks" Daily Activity     Outcome Measure Help from another person eating meals?: None Help from another person taking care of personal grooming?: A Little Help from another person toileting, which includes using toliet, bedpan, or urinal?: A Little Help from another person bathing (including washing, rinsing, drying)?: A Little Help from another person to put on and taking off regular upper body clothing?: A Little Help from another person to put on and taking off regular lower body clothing?: A Little 6 Click Score: 19   End of Session Equipment Utilized During Treatment: Gait belt;Other (comment) (personal cane) Nurse Communication: Other (comment) (ok to participate, present at start of session to encourage participation.)  Activity Tolerance: Patient tolerated treatment well Patient left: in bed;with call bell/phone within reach;with family/visitor present  OT Visit Diagnosis: Unsteadiness on feet (R26.81);Other abnormalities of gait and mobility (R26.89);Muscle weakness (generalized) (M62.81)                Time: [id number] OT Time Calculation (min): 21 min Charges:  OT General Charges $OT Visit: 1 Visit OT Evaluation $OT Eval Moderate Complexity: 1 Mod  Mattew Chriswell OTR/L, MS Acute  Rehabilitation Department Office# [phone]   Willa Rough 12/03/2022, 1:08 PM

## 2022-12-03 NOTE — Progress Notes (Signed)
OT Cancellation Note  Patient Details Name: [name] MRN: [id number] DOB: 1935/04/21   Cancelled Treatment:    Reason Eval/Treat Not Completed: Patient declined, no reason specified Patient declined to participate in therapy reporting he was already exhausted from eating EOB this AM. Patient reported " the only time I am walking is to walk out of here" MD made aware. Rennie Plowman, MS Acute Rehabilitation Department Office# [phone]  12/03/2022, 10:37 AM

## 2022-12-04 ENCOUNTER — Inpatient Hospital Stay: Payer: Medicare Other | Attending: Hematology | Admitting: Hematology

## 2022-12-04 ENCOUNTER — Other Ambulatory Visit: Payer: Self-pay

## 2022-12-04 VITALS — BP 125/76 | HR 99 | Temp 98.4°F | Resp 18 | Ht 71.0 in

## 2022-12-04 DIAGNOSIS — C3432 Malignant neoplasm of lower lobe, left bronchus or lung: Secondary | ICD-10-CM | POA: Diagnosis not present

## 2022-12-04 DIAGNOSIS — Z79620 Long term (current) use of immunosuppressive biologic: Secondary | ICD-10-CM | POA: Insufficient documentation

## 2022-12-04 DIAGNOSIS — C7971 Secondary malignant neoplasm of right adrenal gland: Secondary | ICD-10-CM | POA: Insufficient documentation

## 2022-12-04 DIAGNOSIS — C3492 Malignant neoplasm of unspecified part of left bronchus or lung: Secondary | ICD-10-CM

## 2022-12-04 DIAGNOSIS — C7951 Secondary malignant neoplasm of bone: Secondary | ICD-10-CM | POA: Insufficient documentation

## 2022-12-04 DIAGNOSIS — Z87891 Personal history of nicotine dependence: Secondary | ICD-10-CM | POA: Insufficient documentation

## 2022-12-04 DIAGNOSIS — C7972 Secondary malignant neoplasm of left adrenal gland: Secondary | ICD-10-CM | POA: Insufficient documentation

## 2022-12-04 DIAGNOSIS — Z5112 Encounter for antineoplastic immunotherapy: Secondary | ICD-10-CM | POA: Insufficient documentation

## 2022-12-04 DIAGNOSIS — Z79899 Other long term (current) drug therapy: Secondary | ICD-10-CM | POA: Diagnosis not present

## 2022-12-04 NOTE — Progress Notes (Signed)
Patient on plan of care prior to pathways. 

## 2022-12-04 NOTE — Progress Notes (Signed)
START OFF PATHWAY REGIMEN - Non-Small Cell Lung   [id number]:Ipilimumab 1 mg/kg IV D1 + Nivolumab 3 mg/kg IV D1,15,29 q42 Days:   A cycle is every 42 days:     Nivolumab      Ipilimumab   **Always confirm dose/schedule in your pharmacy ordering system**  Patient Characteristics: Stage IV Metastatic, Nonsquamous, Molecular Analysis Completed, Molecular Alteration Present and Targeted Therapy Exhausted OR EGFR Exon 20+ or KRAS G12C+ or HER2+ Present and No Prior Chemo/Immunotherapy OR No Alteration Present, Initial  Chemotherapy/Immunotherapy, PS = 0, 1, No Alteration Present, No Alteration Present, Candidate for Immunotherapy, PD-L1 Expression Positive 1-49% (TPS) / Negative / Not Tested / Awaiting Test Results and Immunotherapy Candidate Therapeutic Status: Stage IV Metastatic Histology: Nonsquamous Cell Broad Molecular Profiling Status: Molecular Analysis Completed Molecular Analysis Results: No Alteration Present ECOG Performance Status: 1 Chemotherapy/Immunotherapy Line of Therapy: Initial Chemotherapy/Immunotherapy EGFR Exons 18-21 Mutation Testing Status: Completed and Negative ALK Fusion/Rearrangement Testing Status: Completed and Negative BRAF V600 Mutation Testing Status: Completed and Negative KRAS G12C Mutation Testing Status: Completed and Negative MET Exon 14 Mutation Testing Status: Completed and Negative RET Fusion/Rearrangement Testing Status: Completed and Negative HER2 Mutation Testing Status: Completed and Negative NTRK Fusion/Rearrangement Testing Status: Completed and Negative ROS1 Fusion/Rearrangement Testing Status: Completed and Negative Immunotherapy Candidate Status: Candidate for Immunotherapy PD-L1 Expression Status: PD-L1 Positive 1-49% (TPS) Intent of Therapy: Non-Curative / Palliative Intent, Discussed with Patient

## 2022-12-04 NOTE — Patient Instructions (Addendum)
[employer]  Discharge Instructions  You were seen and examined today by Dr. Delton Coombes.  Dr. Delton Coombes ordered additional testing at your last visit known as NGS testing. It did not reveal any targetable mutations but did reveal that you would have benefit from immunotherapy.  You would likely have the greatest benefit from a combination of chemotherapy and immunotherapy; however, with your recent admissions and bladder difficulties, Dr. Delton Coombes has recommended a combination of immunotherapies known as Opdivo and [place].  Opdivo and Curt Bears are given once every 3 weeks here in the [facility] through your Port-A-Cath.  Please take your Dilaudid every 6 hours. Do NOT wait until the pain is too bad as it is very difficult to control breakthrough pain.  Please take Megace twice daily to stimulate your appetite. We do not want you losing any weight during treatment.  Follow-up as scheduled.  Thank you for choosing [facility] to provide your oncology and hematology care.   To afford each patient quality time with our provider, please arrive at least 15 minutes before your scheduled appointment time. You may need to reschedule your appointment if you arrive late (10 or more minutes). Arriving late affects you and other patients whose appointments are after yours.  Also, if you miss three or more appointments without notifying the office, you may be dismissed from the clinic at the provider's discretion.    Again, thank you for choosing [facility].  Our hope is that these requests will decrease the amount of time that you wait before being seen by our physicians.   If you have a lab appointment with the [facility] please come in thru the Main Entrance and check in at the main information desk.           _____________________________________________________________  Should you have questions after your visit to [facility], please contact our office at [phone] and follow the prompts.  Our office hours are 8:00 a.m. to 4:30 p.m. Monday - Thursday and 8:00 a.m. to 2:30 p.m. Friday.  Please note that voicemails left after 4:00 p.m. may not be returned until the following business day.  We are closed weekends and all major holidays.  You do have access to a nurse 24-7, just call the main number to the clinic [phone] and do not press any options, hold on the line and a nurse will answer the phone.    For prescription refill requests, have your pharmacy contact our office and allow 72 hours.    Masks are optional in the cancer centers. If you would like for your care team to wear a mask while they are taking care of you, please let them know. You may have one support person who is at least 86 years old accompany you for your appointments.

## 2022-12-04 NOTE — Progress Notes (Signed)
[facility] [address], [place] [address]    Clinic Day:  12/04/2022  Referring physician: Celene Squibb, MD  Patient Care Team: Celene Squibb, MD as PCP - General Freada Bergeron, MD as PCP - Cardiology (Cardiology) Rogene Houston, MD as Consulting Physician (Gastroenterology) Sharmon Revere as Physician Assistant (Cardiology) Brandon Jack, MD as Medical Oncologist (Medical Oncology) Brien Mates, RN as Oncology Nurse Navigator (Medical Oncology)   ASSESSMENT & PLAN:   Assessment: 1.  Metastatic poorly differentiated adenocarcinoma of the lung to the bones and adrenals: - Presentation with cough 3 months, mild hemoptysis 3 weeks.  No weight loss. - Lives at home with his wife.  Independent of ADLs and IADLs.  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  Smoked for 1 year, 1 to 2 cigarettes/day.  3 sisters had breast cancer.  2 brothers had lung cancer. - CT CAP (10/26/2022): 1.4 cm left lower lobe lung nodule.  5 mm nodule in the right apex.  Mass/conglomerate adenopathy in the left hilum 2.7 x 5.8 x 5.8 cm.  Subcarinal lymph node 1.9 cm.  Left supraclavicular lymph node 1.6 cm.  Left paratracheal lymph node 1.5 cm.  No findings suggestive of metastatic disease in the abdomen and pelvis. - Brain MRI (10/28/2022): Negative for metastatic disease. - PET scan (11/07/2022): Bone mets involving the left femur, T3 transverse process, several left ribs, left scapula at the shoulder joint.  Bilateral adrenal mets. - NGS: PD-L1 22 C3-7%, MS-stable, TMB-low, KEAP1, K-ras G12 D, STK 11    2. Prostate cancer: - Robotic assisted laparoscopic prostatectomy on 02/24/2008 at [facility] with extranodal extension, perineural invasion, positive margin.  He received adjuvant XRT/ADT until 08/26/2008. - Last PSA in 2018 undetectable.    3. Mantle cell lymphoma of the colon: - Colon biopsy (08/27/2013) by Dr. Laural Golden  consistent with mantle cell lymphoma. - Bendamustine/rituximab for 6 cycles from 09/14/2013 through 02/07/2014 - Maintenance rituximab from 05/10/2014 through 04/01/2016   PLAN:  1.  Metastatic poorly differentiated adenocarcinoma of the lung to the bones and adrenals: - He was recently admitted to the hospital with bladder issues and was discharged yesterday on antibiotics. - We reviewed NGS results in detail. - We talked about prognosis of metastatic lung cancer in general with the patient, and his daughter and his son on the phone. - With the mutation changes seen STK 11, KRAS and KEAP1, I would favor giving Poseidon regimen.  However he is not a good candidate for chemotherapy. - Hence have recommended double immunotherapy with Opdivo and Yervoy every 3 weeks. - If his physical condition improves down the line, we will add chemotherapy for better response. - We talked about immunotherapy related side effects.  He does not have any autoimmune problems. - He already has port placed.  We will schedule him for immunotherapy as soon as possible.  2.  Right shoulder pain: - He received radiation therapy and his pain has improved. - He now has pain in the knees. - I have recommended Dilaudid 2 mg every 6 hours as needed. - His wife is reportedly giving pain medication only at bedtime.  Daughter reports that his wife is also declining physical therapy at home for the patient.  3.  UTI: - He has an artificial bladder sphincter and penile pump.  Continue antibiotics as prescribed by his urologist.  4.  Nausea: - Continue Reglan 10 mg every  6 hours as needed which works better than Compazine for him.  5.  Sleeping difficulty: - He will start trazodone at nighttime.  He will stop Ativan.  No orders of the defined types were placed in this encounter.   I,Alexis Herring,acting as a Education administrator for [employer], MD.,have documented all relevant documentation on the behalf of Brandon Jack, MD,as directed by  Brandon Jack, MD while in the presence of Brandon Jack, MD.  I, Brandon Jack MD, have reviewed the above documentation for accuracy and completeness, and I agree with the above.    Brandon Jack, MD   3/13/20245:45 PM  CHIEF COMPLAINT:   Diagnosis: Metastatic adenocarcinoma of the lung   Cancer Staging  Primary lung adenocarcinoma, left [facility]) Staging form: Lung, AJCC 8th Edition - Clinical stage from 11/13/2022: Stage IVB (cT1b, cN3, cM1c) - Unsigned    Prior Therapy: none  Current Therapy: Opdivo and Yervoy  HISTORY OF PRESENT ILLNESS:   Oncology History  Mantle cell lymphoma ([place])  08/27/2013 Initial Diagnosis   Colon, biopsy, random - MANTLE CELL LYMPHOMA   09/06/2013 Imaging   CT CAP- No significant lymphadenopathy identified within the chest, abdomen or pelvis.   09/14/2013 - 02/07/2014 Chemotherapy   Bendamustine/Rituxan x 6 cycles with Neulasta support   12/17/2013 Procedure   Colonoscopy with biopsy by Dr. Laural Golden.   12/17/2013 Pathology Results   Colon, biopsy, ascending and transverse - BENIGN APPEARING COLONIC MUCOSA WITH SCATTERED ATYPICAL LYMPHOCYTES.Overall, these findings are suspicious for minimal residual mantle cell lymphoma.   02/28/2014 Procedure   Colonoscopy with biopsy by Dr. Laural Golden   02/28/2014 Pathology Results   Colon, biopsy, proximal and distal - BENIGN COLONIC MUCOSA. - NO SIGNIFICANT INFLAMMATION OR OTHER ABNORMALITIES IDENTIFIED. - NO EVIDENCE OF LYMPHOMA, ADENOMATOUS CHANGES OR MALIGNANCY.   02/28/2014 Remission   No evidence of malignancy on colonosocpy and pathology from biopsy.   05/10/2014 - 04/01/2016 Chemotherapy   Maintenance Rituxan 500 mg/m x 2 years.   05/11/2015 Imaging   No definite findings to suggest metastatic disease in chest, abdomen or pelvis, new ground glass atten in lungs B, nonspecific, repeat 6 to 12 mo   06/30/2015 - 07/02/2015 Hospital Admission   [facility]    08/04/2015 Procedure   Colonoscopy with biopsy by Dr. Laural Golden.   08/04/2015 Pathology Results   Colon, biopsy, right and left. - BENIGN COLORECTAL MUCOSA. - ASSOCIATED BENIGN LYMPHOID AGGREGATES. - NO EVIDENCE OF SIGNIFICANT INFLAMMATION, DYSPLASIA OR MALIGNANCY.   05/10/2016 Imaging   CT CAP- 1. No definite findings to suggest metastatic disease in the chest, abdomen or pelvis. There are several new patchy areas of ground-glass attenuation in the lungs bilaterally which are highly nonspecific. The possibility of developing interstitial lung disease is not excluded, and repeat high-resolution chest CT is suggested in 6-12 months to assess for temporal changes in the appearance of the lung parenchyma.    06/17/2016 Imaging   CT chest high resolution- 1. No convincing findings of interstitial lung disease. Minimal subpleural reticulation and ground-glass attenuation in the dependent lower lobes is not significantly changed since 05/11/2015, and appears slightly less prominent on the inspiratory sequence, suggesting a combination of mild hypoventilatory change and minimal nonspecific scarring. No traction bronchiectasis or frank honeycombing. 2. Two tiny solid pulmonary nodules in the right middle lobe and left lower lobe are stable and probably benign. 3. Left lower lobe ground-glass 11 mm nodule is stable. Given persistence, repeat CT is recommended every 2 years until 5 years of stability has been  established. This recommendation follows the consensus statement: Guidelines for Management of Incidental Pulmonary Nodules Detected on CT Images: From the Fleischner Society 2017; Radiology 2017; 284:228-243. 4. Additional findings include aortic atherosclerosis and coronary atherosclerosis.   08/19/2016 Imaging   CT abd/pelvis- 1. Tiny nonobstructive stones in the right kidney. No cause for left flank pain identified. 2. Diverticulosis without focal diverticulitis. 3.  Atherosclerosis. 4. Rounded density over the left lower chest on the scout view only, not seen on the June 17, 2016 CT is likely something on the patient's such as an EKG lead. Recommend clinical correlation. 5. Cholelithiasis.      INTERVAL HISTORY:   Salar is a 88 y.o. male presenting to clinic today for follow up of stage III lung cancer. He was last seen by me on 11/13/22.  Since her last visit with me, he was hospitalized for 3 days beginning on 11/30/22 for UTI with urinary retention. Per discharge summary note from Dr. Cyd Silence: "Patient was initiated on intravenous antibiotic therapy with vancomycin and ceftriaxone.  Blood and urine cultures were obtained.  Upon evaluation on the medical floor on arrival to [facility] recommended Foley catheter placement and after long discussion patient declined this option. Case was discussed with covering urologist Dr. Bernarda Caffey at [facility] health who recommended ensuring that the patient's artificial urethral catheter was deactivated while the patient was treated for his underlying complicated urinary tract infection.  Our local urologist felt the patient's device was essentially nonoperational due to the fact that serial bladder scans revealed that the patient was adequately emptying his bladder.  After several days of intravenous antibiotics, patient exhibited improving lethargy and confusion with mentation approaching baseline.  Severe hyperglycemia that the patient initially presented with also dramatically improved as well with titration of basal bolus insulin therapy.   On 3/9, urine cultures grew out klebsiella pneumonia.  Antibiotic therapy was then de-escalated to oral Duricef with urology recommending a total of a 14-day course of therapy. Patient was discharged home in improved and stable condition on 12/03/2022 having been advised to follow-up closely as an outpatient with his urologist Dr.  Wardell Heath..."  Today, he states that he is doing well overall. His appetite level is at 20%. His energy level is at 20%.  He complains of knees hurting all the time.   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, anomalous circumflex - no PCIs   Essential hypertension, benign    GERD (gastroesophageal reflux disease)    Mixed hyperlipidemia    Obstructive sleep apnea    does not use   Prostate cancer (HCC)    PVD (peripheral vascular disease) ([place])    Type 2 diabetes mellitus ([place])     Surgical History: Past Surgical History:  Procedure Laterality Date   BACK SURGERY     BALLOON DILATION N/A 08/27/2013   Procedure: BALLOON DILATION;  Surgeon: Rogene Houston, MD;  [facility];  Service: Endoscopy;  Laterality: N/A;   COLON SURGERY     COLONOSCOPY N/A 12/17/2013   Procedure: COLONOSCOPY;  Surgeon: Rogene Houston, MD;  [facility];  Service: Endoscopy;  Laterality: N/A;  940  COLONOSCOPY N/A 02/28/2014   Procedure: COLONOSCOPY;  Surgeon: Rogene Houston, MD;  [facility];  Service: Endoscopy;  Laterality: N/A;  730   COLONOSCOPY N/A 08/04/2015   Procedure: COLONOSCOPY;  Surgeon: Rogene Houston, MD;  [facility];  Service: Endoscopy;  Laterality: N/A;  200 - moved to 11/11 @ 2:10 - Ann to notify pt   COLONOSCOPY WITH ESOPHAGOGASTRODUODENOSCOPY (EGD) N/A 08/27/2013   Procedure: COLONOSCOPY WITH ESOPHAGOGASTRODUODENOSCOPY (EGD);  Surgeon: Rogene Houston, MD;  [facility];  Service: Endoscopy;  Laterality: N/A;  730   COLONOSCOPY WITH PROPOFOL N/A 01/02/2022   Procedure: COLONOSCOPY WITH PROPOFOL;  Surgeon: Rogene Houston, MD;  [facility];  Service: Endoscopy;  Laterality: N/A;  1205   CYSTOSCOPY N/A 06/09/2021   Procedure: CYSTOSCOPY;  Surgeon: Lucas Mallow, MD;  Location: [facility];  Service: Urology;  Laterality: N/A;   IR IMAGING GUIDED PORT INSERTION  11/05/2022   IR REMOVAL TUN ACCESS W/ PORT W/O FL MOD SED  07/17/2018   IR US GUIDE BX ASP/DRAIN  11/05/2022   KNEE ARTHROSCOPY Left    KNEE SURGERY Right    total knee   MALONEY DILATION N/A 08/27/2013   Procedure: MALONEY DILATION;  Surgeon: Rogene Houston, MD;  [facility];  Service: Endoscopy;  Laterality: N/A;   POLYPECTOMY  01/02/2022   Procedure: POLYPECTOMY;  Surgeon: Rogene Houston, MD;  [facility];  Service: Endoscopy;;  cecal;ascending;proximal, sigmoid;rectal;   PORTACATH PLACEMENT Right 09/23/2012   PROSTATECTOMY     REMOVAL OF PENILE PROSTHESIS N/A 06/09/2021   Procedure: REMOVAL OF ARTIFICIAL URINARY SPHINCER; REMOVAL OF INFLATABLE PENILE PROSTHESIS;  Surgeon: Lucas Mallow, MD;  Location: [facility];  Service: Urology;  Laterality: N/A;   removal of port Right    SAVORY DILATION N/A 08/27/2013   Procedure: SAVORY DILATION;  Surgeon: Rogene Houston, MD;  [facility];  Service: Endoscopy;  Laterality: N/A;   SHOULDER SURGERY     TONSILLECTOMY     URINARY SPHINCTER IMPLANT N/A 01/23/2017   artificial urinary sphincter implant by Dr. Odis Luster   VASECTOMY      Social History: Social History   Socioeconomic History   Marital status: Married    Spouse name: [name]   Number of children: 2   Years of education: 9th grade   Highest education level: Not on file  Occupational History   Occupation: Retired     Fish farm manager: RETIRED  Tobacco Use   Smoking status: Never   Smokeless tobacco: Never  Vaping Use   Vaping Use: Never used  Substance and Sexual Activity   Alcohol use: No   Drug use: No   Sexual activity: Yes    Birth control/protection: None  Other Topics Concern   Not on  file  Social History Narrative   Lives with wife   Caffeine use: coffee 1 cup some days of the week. Update 02/10/2020 ("very rarely though")   Right-handed   Social Determinants of Health   Financial Resource Strain: Not on file  Food Insecurity: No Food Insecurity (11/30/2022)   Hunger Vital Sign    Worried About Running Out of Food in the Last Year: Never true    Ran Out of Food in the Last Year: Never true  Transportation Needs: No Transportation Needs (11/30/2022)   PRAPARE - Hydrologist (Medical): No    Lack of Transportation (Non-Medical):  No  Physical Activity: Not on file  Stress: Not on file  Social Connections: Not on file  Intimate Partner Violence: Not At Risk (11/30/2022)   Humiliation, Afraid, Rape, and Kick questionnaire    Fear of Current or Ex-Partner: No    Emotionally Abused: No    Physically Abused: No    Sexually Abused: No    Family History: Family History  Problem Relation Age of Onset   Colon cancer Brother    Cancer Brother    Diabetes Father    Cancer Brother     Current Medications:  Current Outpatient Medications:    acetaminophen (TYLENOL) 325 MG tablet, Take 650 mg by mouth every 6 (six) hours as needed for mild pain or moderate pain., Disp: , Rfl:    albuterol (PROAIR HFA) 108 (90 Base) MCG/ACT inhaler, Inhale 2 puffs into the lungs at bedtime as needed for wheezing or shortness of breath., Disp: 18 g, Rfl: 1   amLODipine (NORVASC) 10 MG tablet, Take 1 tablet (10 mg total) by mouth daily. (Patient taking differently: Take 5 mg by mouth daily.), Disp: 90 tablet, Rfl: 3   aspirin EC 81 MG tablet, Take 81 mg by mouth daily., Disp: , Rfl:    atorvastatin (LIPITOR) 40 MG tablet, Take 40 mg by mouth daily., Disp: , Rfl:    blood glucose meter kit and supplies KIT, Dispense based on patient and insurance preference. Use at least three times a day to check BS levels and fluctuation., Disp: 1 each, Rfl: 0    budesonide-formoterol (SYMBICORT) 160-4.5 MCG/ACT inhaler, Inhale 2 puffs into the lungs 2 (two) times daily., Disp: 1 each, Rfl: 11   cefadroxil (DURICEF) 500 MG capsule, Take 1 capsule (500 mg total) by mouth 2 (two) times daily., Disp: 22 capsule, Rfl: 0   dextromethorphan-guaiFENesin (MUCINEX DM) 30-600 MG 12hr tablet, Take 1 tablet by mouth 2 (two) times daily as needed for cough., Disp: , Rfl:    fluticasone (FLONASE) 50 MCG/ACT nasal spray, Place 1 spray into both nostrils as needed for allergies or rhinitis., Disp: , Rfl:    folic acid (FOLVITE) 1 MG tablet, Take 1 tablet (1 mg total) by mouth daily., Disp: 90 tablet, Rfl: 3   HYDROmorphone (DILAUDID) 2 MG tablet, Take 2 mg by mouth every 6 (six) hours as needed for moderate pain., Disp: , Rfl:    insulin lispro (HUMALOG KWIKPEN) 100 UNIT/ML KwikPen, Inject 10 Units into the skin 3 (three) times daily. 4-8 units, Disp: 15 mL, Rfl: 3   LORazepam (ATIVAN) 0.5 MG tablet, Take 0.5 mg by mouth at bedtime as needed for sleep., Disp: , Rfl:    meclizine (ANTIVERT) 25 MG tablet, Take 1 tablet (25 mg total) by mouth 3 (three) times daily as needed for dizziness., Disp: 30 tablet, Rfl: 0   megestrol (MEGACE) 400 MG/10ML suspension, Take 10 mLs (400 mg total) by mouth 2 (two) times daily., Disp: 480 mL, Rfl: 0   metoCLOPramide (REGLAN) 10 MG tablet, Take 1 tablet (10 mg total) by mouth every 6 (six) hours as needed for nausea., Disp: 120 tablet, Rfl: 3   nystatin (MYCOSTATIN/NYSTOP) powder, Apply topically., Disp: , Rfl:    oxyCODONE-acetaminophen (PERCOCET/ROXICET) 5-325 MG tablet, , Disp: , Rfl:    pantoprazole (PROTONIX) 40 MG tablet, Take 40 mg by mouth 2 (two) times daily., Disp: , Rfl:    polyethylene glycol powder (GLYCOLAX/MIRALAX) 17 GM/SCOOP powder, Take by mouth., Disp: , Rfl:    propranolol ER (INDERAL LA) 80  MG 24 hr capsule, Take 1 capsule (80 mg total) by mouth at bedtime., Disp: 30 capsule, Rfl: 0   testosterone cypionate  (DEPOTESTOSTERONE CYPIONATE) 200 MG/ML injection, Inject 200 mg into the muscle every 14 (fourteen) days. , Disp: , Rfl:    TOUJEO SOLOSTAR 300 UNIT/ML Solostar Pen, Inject 40 Units into the skin daily., Disp: 1.5 mL, Rfl: 3   traZODone (DESYREL) 50 MG tablet, Take 1 tablet (50 mg total) by mouth at bedtime as needed for sleep., Disp: 20 tablet, Rfl: 0 No current facility-administered medications for this visit.  Facility-Administered Medications Ordered in Other Visits:    heparin lock flush 100 unit/mL, 500 Units, Intravenous, Once, Kefalas, Thomas S, PA-C   sodium chloride flush (NS) 0.9 % injection 20 mL, 20 mL, Intravenous, PRN, Sheldon Silvan, Manon Hilding, PA-C   Allergies: Allergies  Allergen Reactions   Bee Venom Anaphylaxis   Iodine     Pt does not remember reaction  Other Reaction(s): GI Intolerance, Hypotension, Other (See Comments)  syncope   Adhesive [Tape] Hives   Ciprofloxacin     Pt does not remember reaction    Povidone-Iodine     Pt does not remember reaction    Simvastatin     Pt does not remember reaction    Codeine     Pt does not remember reaction  Other Reaction(s): GI Intolerance   Latex Rash    REVIEW OF SYSTEMS:   Review of Systems  Constitutional:  Negative for chills, fatigue and fever.  HENT:   Negative for lump/mass, mouth sores, nosebleeds, sore throat and trouble swallowing.   Eyes:  Negative for eye problems.  Respiratory:  Positive for cough and shortness of breath.   Cardiovascular:  Negative for chest pain, leg swelling and palpitations.  Gastrointestinal:  Positive for constipation and nausea. Negative for abdominal pain, diarrhea and vomiting.  Genitourinary:  Negative for bladder incontinence, difficulty urinating, dysuria, frequency, hematuria and nocturia.   Musculoskeletal:  Positive for arthralgias. Negative for back pain, flank pain, myalgias and neck pain.  Skin:  Negative for itching and rash.  Neurological:  Positive for dizziness and  headaches. Negative for numbness.  Hematological:  Does not bruise/bleed easily.  Psychiatric/Behavioral:  Positive for sleep disturbance. Negative for depression and suicidal ideas. The patient is not nervous/anxious.   All other systems reviewed and are negative.    VITALS:   Blood pressure 125/76, pulse 99, temperature 98.4 F (36.9 C), temperature source Oral, resp. rate 18, height '5\' 11"'$  (1.803 m), SpO2 93 %.  Wt Readings from Last 3 Encounters:  11/30/22 201 lb 12.8 oz (91.5 kg)  11/12/22 217 lb 13 oz (98.8 kg)  11/05/22 217 lb 13 oz (98.8 kg)    Body mass index is 28.15 kg/m.  Performance status (ECOG): 1 - Symptomatic but completely ambulatory  PHYSICAL EXAM:   Physical Exam Vitals and nursing note reviewed. Exam conducted with a chaperone present.  Constitutional:      Appearance: Normal appearance.  Cardiovascular:     Rate and Rhythm: Normal rate and regular rhythm.     Pulses: Normal pulses.     Heart sounds: Normal heart sounds.  Pulmonary:     Effort: Pulmonary effort is normal.     Breath sounds: Normal breath sounds.  Abdominal:     Palpations: Abdomen is soft. There is no hepatomegaly, splenomegaly or mass.     Tenderness: There is no abdominal tenderness.  Musculoskeletal:     Right lower leg: No edema.  Left lower leg: No edema.  Lymphadenopathy:     Cervical: No cervical adenopathy.     Right cervical: No superficial, deep or posterior cervical adenopathy.    Left cervical: No superficial, deep or posterior cervical adenopathy.     Upper Body:     Right upper body: No supraclavicular or axillary adenopathy.     Left upper body: No supraclavicular or axillary adenopathy.  Neurological:     General: No focal deficit present.     Mental Status: He is alert and oriented to person, place, and time.  Psychiatric:        Mood and Affect: Mood normal.        Behavior: Behavior normal.     LABS:      Latest Ref Rng & Units 12/03/2022    3:59 AM  12/02/2022    3:32 AM 12/01/2022    4:24 AM  CBC  WBC 4.0 - 10.5 K/uL 4.0  4.1  4.0   Hemoglobin 13.0 - 17.0 g/dL 8.1  8.7  8.3   Hematocrit 39.0 - 52.0 % 26.0  28.5  26.0   Platelets 150 - 400 K/uL 103  117  114       Latest Ref Rng & Units 12/03/2022    3:59 AM 12/02/2022    3:32 AM 12/01/2022    4:24 AM  CMP  Glucose 70 - 99 mg/dL 291  360  388   BUN 8 - 23 mg/dL 43  48  38   Creatinine 0.61 - 1.24 mg/dL 1.65  1.77  1.62   Sodium 135 - 145 mmol/L 133  129  130   Potassium 3.5 - 5.1 mmol/L 4.6  4.7  4.4   Chloride 98 - 111 mmol/L 100  95  94   CO2 22 - 32 mmol/L '22  24  27   '$ Calcium 8.9 - 10.3 mg/dL 8.5  8.5  8.5   Total Protein 6.5 - 8.1 g/dL 5.7  6.1    Total Bilirubin 0.3 - 1.2 mg/dL 0.6  0.6    Alkaline Phos 38 - 126 U/L 66  72    AST 15 - 41 U/L 12  12    ALT 0 - 44 U/L 13  12       No results found for: "CEA1", "CEA" / No results found for: "CEA1", "CEA" No results found for: "PSA1" No results found for: "[id number]" No results found for: "CAN125"  No results found for: "TOTALPROTELP", "ALBUMINELP", "A1GS", "A2GS", "BETS", "BETA2SER", "GAMS", "MSPIKE", "SPEI" Lab Results  Component Value Date   TIBC 278 05/24/2014   FERRITIN 163 05/24/2014   IRONPCTSAT 30 05/24/2014   Lab Results  Component Value Date   LDH 131 02/23/2018   LDH 131 09/25/2017   LDH 136 07/14/2017     STUDIES:   DG Chest Port 1 View  Result Date: 11/30/2022 CLINICAL DATA:  Possible sepsis. EXAM: PORTABLE CHEST 1 VIEW COMPARISON:  11/12/2022 FINDINGS: RIGHT power port tip overlies the superior vena cava. Low lung volume. Heart size is normal. There is patchy opacity in the LEFT mid lung zone and LEFT lung base. LEFT hilar mass is better seen on prior studies. RIGHT lung is clear. IMPRESSION: 1. Low lung volume. 2. LEFT mid lung zone and LEFT LOWER lobe atelectasis or minimal infiltrate. Electronically Signed   By: Nolon Nations M.D.   On: 11/30/2022 08:46   NM PET Image Initial (PI) Skull Base  To Thigh  Addendum Date: 11/14/2022  ADDENDUM REPORT: 11/14/2022 08:36 ADDENDUM: Hypermetabolic activity in the acromion process of the RIGHT scapula. Underlying lytic lesion on CT portion exam. Findings consistent with additional skeletal metastasis. Findings conveyed toSREEDHAR Meleana Commerford on 11/14/2022  at08:36. Electronically Signed   By: Suzy Bouchard M.D.   On: 11/14/2022 08:36   Result Date: 11/14/2022 CLINICAL DATA:  Subsequent treatment strategy for non-small cell lung cancer. EXAM: NUCLEAR MEDICINE PET SKULL BASE TO THIGH TECHNIQUE: 11.1 mCi F-18 FDG was injected intravenously. Full-ring PET imaging was performed from the skull base to thigh after the radiotracer. CT data was obtained and used for attenuation correction and anatomic localization. Fasting blood glucose: 195 mg/dl COMPARISON:  CT chest abdomen pelvis 10/26/2022 FINDINGS: Mediastinal blood pool activity: SUV max 3.3 Liver activity: SUV max NA NECK: Intensely hypermetabolic and enlarged LEFT supraclavicular lymph node measures 2.2 cm with SUV max equal 11.1 (image 66) Incidental CT findings: None. CHEST: Hypermetabolic LEFT lower paratracheal, subcarinal and hilar adenopathy. Example subcarinal node measures 2 cm SUV max equal 11.3. Bulky LEFT infrahilar node measures 3 cm with SUV max equal 9.0 on image 102). Solitary hypermetabolic nodule in the LEFT lower lobe measures 1.2 cm with SUV max equal 2.6 on image 96) Incidental CT findings: None. ABDOMEN/PELVIS: Hypermetabolic RIGHT adrenal gland with SUV max equal 8.0 (image 150). A small nodule on CT portion exam measures 10 mm. Rounded LEFT adrenal gland with SUV max equal 5.6 is also suspicious for metastasis. No abnormal metabolic activity liver. No hypermetabolic abdominopelvic lymph nodes. Hypermetabolic activity through the proximal rectum with SUV max equal 8.0. No clear CT lesion. Skeletal: Hypermetabolic lesion proximal LEFT femur just below the lesser trochanter with SUV max  equal 5.6 on image 277. Focal uptake in the anterior LEFT chest wall at at the junction of the first rib and manubrium with SUV max equal 8.3 is favored skeletal metastasis. Focal uptake in the LEFT transverse process of the T3 vertebral body (image 73) Lateral LEFT rib lesion on image [id number] is hypermetabolic. IMPRESSION: 1. Hypermetabolic LEFT hilar and mediastinal metastatic adenopathy. Large hypermetabolic metastatic LEFT supraclavicular lymph node. Favor lung cancer metastasis. 2. Hypermetabolic nodule within the LEFT lower lobe representing a metastatic lesion or primary bronchogenic carcinoma. 3. Bilateral hypermetabolic adrenal metastasis. 4. Several skeletal hypermetabolic metastasis. Lesions in the proximal LEFT femur, T3 transverse process, and several LEFT ribs. Electronically Signed: By: Suzy Bouchard M.D. On: 11/07/2022 12:57   DG Chest 2 View  Result Date: 11/12/2022 CLINICAL DATA:  Suspected sepsis. EXAM: CHEST - 2 VIEW COMPARISON:  Chest radiographs 12/25/2022, 10/09/2021, 09/03/2021, 05/19/2021; CT chest 10/26/2022. FINDINGS: Right chest wall porta catheter with tip overlying the central superior vena cava, new from prior. Moderately decreased lung volumes are mildly improved from prior. Cardiac silhouette is at the upper limits of normal size. There is enlargement of the left hilum which appears more prominent than on prior 10/26/2022 and 10/09/2021 radiographs indefinitely new from 05/19/2021 radiographs. This corresponds to the mass versus conglomerate adenopathy seen on 10/26/2022 chest CT. The 1.4 cm spiculated nodule within the superior segment of the left lower lobe seen on recent 10/26/2022 CT is not well visualized on radiographs. There is left lower lung horizontal linear interstitial thickening that appears unchanged from multiple prior radiographs. No pleural effusion or pneumothorax. Mild-to-moderate multilevel degenerative disc changes of the thoracic spine. IMPRESSION: 1.  Enlargement of the left hilum corresponds to the mass versus conglomerate adenopathy seen on 10/26/2022 chest CT. 2. The 1.4 cm spiculated nodule within the superior  segment of the left lower lobe seen on recent 10/26/2022 CT is not well visualized on radiographs. 3. Mildly increased interstitial thickening within the left lower lung appears similar to multiple prior radiographs and may represent scarring versus subsegmental atelectasis. It is difficult to exclude early pneumonia. Electronically Signed   By: Yvonne Kendall M.D.   On: 11/12/2022 10:34   DG Shoulder Right  Result Date: 11/11/2022 CLINICAL DATA:  Right shoulder pain and decreased range of motion. EXAM: RIGHT SHOULDER - 2+ VIEW COMPARISON:  Right shoulder radiographs 10/12/2021 FINDINGS: No acute fracture or dislocation is identified. Moderate acromioclavicular osteoarthrosis is again noted. Calcifications are again seen adjacent to the greater tuberosity of the humerus suggestive of calcific rotator cuff tendinopathy. A right chest Port-A-Cath is in place. IMPRESSION: 1. No acute osseous abnormality. 2. Moderate acromioclavicular osteoarthrosis. 3. Suspected calcific rotator cuff tendinopathy. Electronically Signed   By: Logan Bores M.D.   On: 11/11/2022 13:13   IR IMAGING GUIDED PORT INSERTION  Result Date: 11/05/2022 INDICATION: 88 year old male with lung carcinoma referred for port catheter and biopsy of left supraclavicular lymph node EXAM: IMPLANTED PORT A CATH PLACEMENT WITH ULTRASOUND AND FLUOROSCOPIC GUIDANCE IMAGE GUIDED BIOPSY OF LEFT SUPRACLAVICULAR LYMPH NODE MEDICATIONS: None ANESTHESIA/SEDATION: Moderate (conscious) sedation was employed during this procedure. A total of Versed 2.0 mg and Fentanyl 100 mcg was administered intravenously by the radiology nurse. Total intra-service moderate Sedation Time: 38 minutes. The patient's level of consciousness and vital signs were monitored continuously by radiology nursing throughout the  procedure under my direct supervision. FLUOROSCOPY: Radiation Exposure Index (as provided by the fluoroscopic device): 1 mGy Kerma COMPLICATIONS: None PROCEDURE: The procedure, risks, benefits, and alternatives were explained to the patient. Questions regarding the procedure were encouraged and answered. The patient understands and consents to the procedure. Ultrasound survey was performed of the left supraclavicular region with images stored and sent to PACs. The left neck was prepped with chlorhexidine in a sterile fashion, and a sterile drape was applied covering the operative field. A sterile gown and sterile gloves were used for the procedure. Local anesthesia was provided with 1% Lidocaine. Ultrasound guidance was used to infiltrate the region with 1% lidocaine for local anesthesia. Four separate 18 gauge core biopsy were then acquired of the pathologic left supraclavicular lymph node using ultrasound guidance. Images were stored. Tissue placed into fresh specimen. Final image was stored after biopsy. We then proceeded with port catheter. The patient was prepped and draped in the usual sterile fashion. 1% lidocaine was used for local anesthesia. Ultrasound survey was performed with images stored and sent to PACs. Right IJ vein documented to be patent. The right neck and chest was prepped with chlorhexidine, and draped in the usual sterile fashion using maximum barrier technique (cap and mask, sterile gown, sterile gloves, large sterile sheet, hand hygiene and cutaneous antiseptic). Local anesthesia was attained by infiltration with 1% lidocaine without epinephrine. Ultrasound demonstrated patency of the right internal jugular vein, and this was documented with an image. Under real-time ultrasound guidance, this vein was accessed with a 21 gauge micropuncture needle and image documentation was performed. A small dermatotomy was made at the access site with an 11 scalpel. A 0.018" wire was advanced into the SVC  and used to estimate the length of the internal catheter. The access needle exchanged for a 81F micropuncture vascular sheath. The 0.018" wire was then removed and a 0.035" wire advanced into the IVC. An appropriate location for the subcutaneous reservoir was selected below  the clavicle and an incision was made through the skin and underlying soft tissues. The subcutaneous tissues were then dissected using a combination of blunt and sharp surgical technique and a pocket was formed. A single lumen power injectable portacatheter was then tunneled through the subcutaneous tissues from the pocket to the dermatotomy and the port reservoir placed within the subcutaneous pocket. The venous access site was then serially dilated and a peel away vascular sheath placed over the wire. The wire was removed and the port catheter advanced into position under fluoroscopic guidance. The catheter tip is positioned in the cavoatrial junction. This was documented with a spot image. The portacatheter was then tested and found to flush and aspirate well. The port was flushed with saline followed by 100 units/mL heparinized saline. The pocket was then closed in two layers using first subdermal inverted interrupted absorbable sutures followed by a running subcuticular suture. The epidermis was then sealed with Dermabond. The dermatotomy at the venous access site was also seal with Dermabond. Patient tolerated the procedure well and remained hemodynamically stable throughout. No complications encountered and no significant blood loss encountered IMPRESSION: Status post image guided biopsy of left supraclavicular pathologic lymph node, as well as right IJ port catheter. Signed, Dulcy Fanny. Nadene Rubins, RPVI [facility] Radiology Electronically Signed   By: Corrie Mckusick D.O.   On: 11/05/2022 16:41   IR US Guide Bx Asp/Drain  Result Date: 11/05/2022 INDICATION: 88 year old male with lung  carcinoma referred for port catheter and biopsy of left supraclavicular lymph node EXAM: IMPLANTED PORT A CATH PLACEMENT WITH ULTRASOUND AND FLUOROSCOPIC GUIDANCE IMAGE GUIDED BIOPSY OF LEFT SUPRACLAVICULAR LYMPH NODE MEDICATIONS: None ANESTHESIA/SEDATION: Moderate (conscious) sedation was employed during this procedure. A total of Versed 2.0 mg and Fentanyl 100 mcg was administered intravenously by the radiology nurse. Total intra-service moderate Sedation Time: 38 minutes. The patient's level of consciousness and vital signs were monitored continuously by radiology nursing throughout the procedure under my direct supervision. FLUOROSCOPY: Radiation Exposure Index (as provided by the fluoroscopic device): 1 mGy Kerma COMPLICATIONS: None PROCEDURE: The procedure, risks, benefits, and alternatives were explained to the patient. Questions regarding the procedure were encouraged and answered. The patient understands and consents to the procedure. Ultrasound survey was performed of the left supraclavicular region with images stored and sent to PACs. The left neck was prepped with chlorhexidine in a sterile fashion, and a sterile drape was applied covering the operative field. A sterile gown and sterile gloves were used for the procedure. Local anesthesia was provided with 1% Lidocaine. Ultrasound guidance was used to infiltrate the region with 1% lidocaine for local anesthesia. Four separate 18 gauge core biopsy were then acquired of the pathologic left supraclavicular lymph node using ultrasound guidance. Images were stored. Tissue placed into fresh specimen. Final image was stored after biopsy. We then proceeded with port catheter. The patient was prepped and draped in the usual sterile fashion. 1% lidocaine was used for local anesthesia. Ultrasound survey was performed with images stored and sent to PACs. Right IJ vein documented to be patent. The right neck and chest was prepped with chlorhexidine, and draped in the  usual sterile fashion using maximum barrier technique (cap and mask, sterile gown, sterile gloves, large sterile sheet, hand hygiene and cutaneous antiseptic). Local anesthesia was attained by infiltration with 1% lidocaine without epinephrine. Ultrasound demonstrated patency of the right internal jugular vein, and this was documented with an image. Under real-time ultrasound guidance, this vein  was accessed with a 21 gauge micropuncture needle and image documentation was performed. A small dermatotomy was made at the access site with an 11 scalpel. A 0.018" wire was advanced into the SVC and used to estimate the length of the internal catheter. The access needle exchanged for a 18F micropuncture vascular sheath. The 0.018" wire was then removed and a 0.035" wire advanced into the IVC. An appropriate location for the subcutaneous reservoir was selected below the clavicle and an incision was made through the skin and underlying soft tissues. The subcutaneous tissues were then dissected using a combination of blunt and sharp surgical technique and a pocket was formed. A single lumen power injectable portacatheter was then tunneled through the subcutaneous tissues from the pocket to the dermatotomy and the port reservoir placed within the subcutaneous pocket. The venous access site was then serially dilated and a peel away vascular sheath placed over the wire. The wire was removed and the port catheter advanced into position under fluoroscopic guidance. The catheter tip is positioned in the cavoatrial junction. This was documented with a spot image. The portacatheter was then tested and found to flush and aspirate well. The port was flushed with saline followed by 100 units/mL heparinized saline. The pocket was then closed in two layers using first subdermal inverted interrupted absorbable sutures followed by a running subcuticular suture. The epidermis was then sealed with Dermabond. The dermatotomy at the venous  access site was also seal with Dermabond. Patient tolerated the procedure well and remained hemodynamically stable throughout. No complications encountered and no significant blood loss encountered IMPRESSION: Status post image guided biopsy of left supraclavicular pathologic lymph node, as well as right IJ port catheter. Signed, Dulcy Fanny. Nadene Rubins, RPVI [facility] Radiology Electronically Signed   By: Corrie Mckusick D.O.   On: 11/05/2022 16:41

## 2022-12-05 LAB — CULTURE, BLOOD (ROUTINE X 2)
Culture: NO GROWTH
Culture: NO GROWTH

## 2022-12-06 DIAGNOSIS — N186 End stage renal disease: Secondary | ICD-10-CM | POA: Diagnosis not present

## 2022-12-06 DIAGNOSIS — E1122 Type 2 diabetes mellitus with diabetic chronic kidney disease: Secondary | ICD-10-CM | POA: Diagnosis not present

## 2022-12-09 DIAGNOSIS — N281 Cyst of kidney, acquired: Secondary | ICD-10-CM | POA: Diagnosis not present

## 2022-12-09 DIAGNOSIS — N209 Urinary calculus, unspecified: Secondary | ICD-10-CM | POA: Diagnosis not present

## 2022-12-09 DIAGNOSIS — N35919 Unspecified urethral stricture, male, unspecified site: Secondary | ICD-10-CM | POA: Diagnosis not present

## 2022-12-09 DIAGNOSIS — E1165 Type 2 diabetes mellitus with hyperglycemia: Secondary | ICD-10-CM | POA: Diagnosis not present

## 2022-12-09 DIAGNOSIS — N35914 Unspecified anterior urethral stricture, male: Secondary | ICD-10-CM | POA: Diagnosis not present

## 2022-12-12 DIAGNOSIS — N189 Chronic kidney disease, unspecified: Secondary | ICD-10-CM | POA: Diagnosis not present

## 2022-12-12 DIAGNOSIS — C7802 Secondary malignant neoplasm of left lung: Secondary | ICD-10-CM | POA: Diagnosis not present

## 2022-12-12 DIAGNOSIS — Z713 Dietary counseling and surveillance: Secondary | ICD-10-CM | POA: Diagnosis not present

## 2022-12-12 DIAGNOSIS — E1142 Type 2 diabetes mellitus with diabetic polyneuropathy: Secondary | ICD-10-CM | POA: Diagnosis not present

## 2022-12-12 DIAGNOSIS — N390 Urinary tract infection, site not specified: Secondary | ICD-10-CM | POA: Diagnosis not present

## 2022-12-12 DIAGNOSIS — Z7984 Long term (current) use of oral hypoglycemic drugs: Secondary | ICD-10-CM | POA: Diagnosis not present

## 2022-12-12 DIAGNOSIS — C7801 Secondary malignant neoplasm of right lung: Secondary | ICD-10-CM | POA: Diagnosis not present

## 2022-12-12 DIAGNOSIS — Z794 Long term (current) use of insulin: Secondary | ICD-10-CM | POA: Diagnosis not present

## 2022-12-12 DIAGNOSIS — Z7182 Exercise counseling: Secondary | ICD-10-CM | POA: Diagnosis not present

## 2022-12-12 DIAGNOSIS — E1140 Type 2 diabetes mellitus with diabetic neuropathy, unspecified: Secondary | ICD-10-CM | POA: Diagnosis not present

## 2022-12-12 DIAGNOSIS — E1122 Type 2 diabetes mellitus with diabetic chronic kidney disease: Secondary | ICD-10-CM | POA: Diagnosis not present

## 2022-12-13 DIAGNOSIS — N1832 Chronic kidney disease, stage 3b: Secondary | ICD-10-CM | POA: Diagnosis not present

## 2022-12-13 DIAGNOSIS — N35919 Unspecified urethral stricture, male, unspecified site: Secondary | ICD-10-CM | POA: Diagnosis not present

## 2022-12-16 ENCOUNTER — Inpatient Hospital Stay: Payer: Medicare Other | Admitting: Dietician

## 2022-12-17 DIAGNOSIS — E1165 Type 2 diabetes mellitus with hyperglycemia: Secondary | ICD-10-CM | POA: Diagnosis not present

## 2022-12-17 MED ORDER — LIDOCAINE-PRILOCAINE 2.5-2.5 % EX CREA: TOPICAL_CREAM | CUTANEOUS | 3 refills | Status: DC

## 2022-12-17 NOTE — Patient Instructions (Addendum)
[facility] Chemotherapy Teaching    Dr. Delton Coombes has reviewed your most recent PET scan and biopsy. You have been diagnosed with Stage IV Lung Cancer. It has spread beyond your lungs to your lymph nodes, bones and adrenal gland.   Because the cancer has spread beyond your lung, it cannot be cured but it can be controlled with treatment.  You will come to the [facility] for two immunotherapies called Opdivo and Yervoy.  You will receive both immunotherapies on Day 1 of your treatment cycle and then Opdivo on Day 15 and Day 28.  This is considered ONE cycle of treatment.    You will see the doctor regularly throughout treatment.  We will obtain blood work from you prior to every treatment and monitor your results to make sure it is safe to give your treatment. The doctor monitors your response to treatment by the way you are feeling, your blood work, and by obtaining scans periodically.  There will be wait times while you are here for treatment.  It will take about 30 minutes to 1 hour for your lab work to result.  Then there will be wait times while pharmacy mixes your medications.    Pepcid:  This medication is a histamine blocker that helps prevent and allergic reaction to your chemotherapy.    Benadryl:  This is a histamine blocker (different from the Pepcid) that helps prevent allergic/infusion reactions to your chemotherapy. This medication may cause dizziness/drowsiness.     Nivolumab (Opdivo)   About This Drug   Nivolumab is used to treat cancer. It is given in the vein (IV).   It helps your immune system work properly to target and kill cancer cells. It will take 30 minutes to infuse.     These are some of the most common or important side effects:    Immune Reactions    This medication stimulates your immune system. Your immune system can attack normal organs and tissues in your body, leading to serious or life threatening complications. It is important to  notify your healthcare provider right away if you develop any of the following symptoms:    Diarrhea / Intestinal problems (colitis, inflammation of the bowel): Abdominal pain, diarrhea, cramping, mucus or blood in the stool, dark or tar-like stools, fever. Diarrhea means different things to different people. Any increase in your normal bowel patterns can be defined as diarrhea and should be reported to your healthcare team.   Skin reactions: Report rash, with or without itching (pruritis), sores in your mouth, blistering or peeling skin, as these can become severe and require treatment with corticosteroids.   Lung problems (pneumonitis, inflammation of the lung): New or worsening cough, shortness of breath, trouble breathing, or chest pain.   Liver problems (hepatitis, inflammation of the liver): Yellowing of the skin or eyes, your urine appears dark or brown, pain in your abdomen, bleeding or bruising more easily than normal, or severe nausea and vomiting.    Brain and/or nerve problems: Report any headache, drooping of eyelids, double vision, trouble swallowing, weakness of arms, legs or face, or numbness or tingling in the hands or feet to your healthcare team.   Hormone abnormalities: Immune reactions can affect the pituitary, thyroid, pancreas and adrenal glands, resulting in inflammation of these glands, which can affect their production of certain hormones. Some hormone levels can be monitored with blood work. It is important that you report any changes in how you are feeling to your care  team. Symptoms of these hormonal changes can include: headaches, nausea, vomiting, constipation, rapid heart rate, increased sweating, extreme fatigue, weakness, changes in your voice, changes in memory and concentration, increased hunger or thirst, increased urination, weight gain, hair loss, dizziness, feeling cold all the time, and changes in mood or behavior (including irritability, forgetfulness and  decreased sex drive).     Eye problems: Report any changes in vision, blurry or double vision, and eye pain or redness to your healthcare team.  Kidney problems (kidney inflammation or failure): Decreased urine output, blood in the urine, swelling in the ankles, loss of appetite.   Fatigue:  Fatigue is very common during cancer treatment and is an overwhelming feeling of exhaustion that is not usually relieved by rest. While on cancer treatment, and for a period after, you may need to adjust your schedule to manage fatigue. Plan times to rest during the day and conserve energy for more important activities. Exercise can help combat fatigue; a simple daily walk with a friend can help.  Talk to your healthcare team for helpful tips on dealing with this side effect.   Allergic Reactions:  In some cases, patients can have an allergic reaction to this medication. Signs of a reaction can include: shortness of breath or difficulty breathing, chest pain, rash, flushing or itching or a decrease in blood pressure. If you notice any changes in how you feel during the infusion, let your nurse know immediately. The infusion will be slowed or stopped if this occurs.    Less common, but important side effects can include:   Allogenic Stem Cell Transplant Reactions: Patients who receive this medication before or after having an allogenic stem cell transplant can be at an increased risk of graft vs. host disease, veno-occlusive disease and fever syndrome. You providers will monitor you closely for these side effects.    Reproductive Concerns  Exposure of an unborn child to this medication could cause birth defects, so you should not become pregnant or father a child while on this medication. Even if your menstrual cycle stops or you believe you are not producing sperm, effective birth control is necessary during treatment and for 5 months after stopping treatment. You should not breastfeed while taking this medication  or for five months after the end of treatment.    Important Information    This drug may be present in the saliva, tears, sweat, urine, stool, vomit, semen, and vaginal secretions. Talk to your doctor and/or your nurse about the necessary precautions to take during this time.   Treating Side Effects    To decrease infection, wash your hands regularly    Avoid close contact with people who have a cold, the flu, or other infections.    Take your temperature as your doctor or nurse tells you, and whenever you feel like you may have a fever    To help decrease bleeding, use a soft toothbrush. Check with your nurse before using dental floss.    Be very careful when using knives or tools    Use an electric shaver instead of a razor    Ask your doctor or nurse about medicines that are available to help stop or lessen constipation, diarrhea and/or nausea.    Drink plenty of fluids (a minimum of eight glasses per day is recommended).    If you are not able to move your bowels, check with your doctor or nurse before you use any enemas, laxatives, or suppositories    To  help with nausea and vomiting, eat small, frequent meals instead of three large meals a day. Choose foods and drinks that are at room temperature. Ask your nurse or doctor about other helpful tips and medicine that is available to help or stop lessen these symptoms.    If you get diarrhea, eat low-fiber foods that are high in protein and calories and avoid foods that can irritate your digestive tracts or lead to cramping. Ask your nurse or doctor about medicine that can lessen or stop your diarrhea.    To help with decreased appetite, eat small, frequent meals    Eat high caloric food such as pudding, ice cream, yogurt and milkshakes.    Manage tiredness by pacing your activities for the day. Be sure to include periods of rest between energy-draining activities    Keeping your pain under control is important to your  wellbeing. Please tell your doctor or nurse if you are experiencing pain.    If you have diabetes, keep good control of your blood sugar level. Tell your nurse or your doctor if your glucose levels are higher or lower than normal    If you get a rash do not put anything on it unless your doctor or nurse says you may. Keep the area around the rash clean and dry. Ask your doctor for medicine if your rash bothers you.    Infusion reactions may occur after your infusion. If this happens, call 911 for emergency care.   Food and Drug Interactions    There are no known interactions of nivolumab with food or other medications.    Tell your doctor and pharmacist about all the medicines and dietary supplements (vitamins, minerals, herbs and others) that you are taking at this time. The safety and use of dietary supplements and alternative diets are often not known. Using these might affect your cancer or interfere with your treatment. Until more is known, you should not use dietary supplements or alternative diets without your cancer doctor's help.   When to Call the Doctor   Call your doctor or nurse if you have any of these symptoms and/or any new or unusual symptoms:    Fever of 100.4 F (38 C) or higher    Chills    Easy bleeding or bruising    Wheezing or trouble breathing    Dry cough, or cough with yellow, green or bloody mucus    Confusion and/or agitation    Hallucinations    Trouble understanding or speaking    Blurry vision or changes in your eyesight    Numbness or lack of strength to your arms, legs, face, or body    Feeling dizzy or lightheaded    Loose bowel movements (diarrhea) more than 4 times a day or diarrhea with weakness or lightheadedness    Nausea that stops you from eating or drinking, and/or that is not relieved by prescribed medicines    Lasting loss of appetite or rapid weight loss of five pounds in a week    No bowel movement for 3 days or you feel  uncomfortable    Bad abdominal pain, especially in upper right area    Fatigue or extreme weakness that interferes with normal activities    Decreased urine    Unusual thirst or passing urine often    Rash that is not relieved by prescribed medicines    Rash or itching    Flu-like symptoms: fever, headache, muscle and joint aches, and fatigue (low  energy, feeling weak)    Signs of liver problems: dark urine, pale bowel movements, bad stomach pain, feeling very tired and weak, unusual itching, or yellowing of the eyes or skin.    Signs of infusion reaction: fever or shaking chills, flushing, facial swelling, feeling dizzy, headache, trouble breathing, rash, itching, chest tightness, or chest pain.    If you think you may be pregnant   Reproduction Warnings    Pregnancy warning: This drug can have harmful effects on the unborn baby. Women of child bearing potential should use effective methods of birth control during your cancer treatment and for at least 5 months after treatment. Let your doctor know right away if you think you may be pregnant.    Breastfeeding warning: It is not known if this drug passes into breast milk. For this reason, women should not breast feed during treatment because this drug could enter the breast milk and cause harm to a breast feeding baby.    Fertility warning: Human fertility studies have not been done with this drug. Talk with your doctor or nurse if you plan to have children. Ask for information on sperm or egg banking.      [name]        Generic name : Ipilimumab    How Yervoy  Is Given:  [name] is given as an intravenous injection through a vein (IV). It helps your immune system work properly to target and kill cancer cells.  It will take 30 minutes to infuse.    Immune Reactions  This medication stimulates your immune system. Your immune system can attack normal organs and tissues in your body, leading to serious or life threatening  complications. It is important to notify your healthcare provider right away if you develop any of the following symptoms:    Diarrhea / Intestinal problems (colitis, inflammation of the bowel): Abdominal pain, diarrhea, cramping, mucus or blood in the stool, dark or tar-like stools, fever. Diarrhea means different things to different people. Any increase in your normal bowel patterns can be defined as diarrhea and should be reported to your healthcare team.   Bowel obstruction or perforation: Abdominal pain, fever, constipation, bloating and cramping. Any decrease in your normal bowel patterns should be reported.    Skin reactions: Report rash, with or without itching (pruritis), sores in your mouth, blistering or peeling skin, as these can become severe and require treatment with corticosteroids.   Liver problems (hepatitis, inflammation of the liver): Yellowing of the skin or eyes, your urine appears dark or brown, pain in your abdomen, bleeding or bruising more easily than normal, or severe nausea and vomiting.   Brain and/or nerve problems: Report any headache, drooping of eyelids, double vision, trouble swallowing, weakness of arms, legs or face, or numbness or tingling in the hands or feet to your healthcare team.   Eye problems: Report any changes in vision, blurry or double vision, and eye pain or redness.    Lung problems (pneumonitis, inflammation of the lung): New or worsening cough, shortness of breath, trouble breathing, or chest pain.   Kidney problems: (kidney inflammation or failure):  Decreased urine output, blood in the urine, swelling in the ankles, loss of appetite.    Hormone abnormalities: Immune reactions can affect the pituitary, thyroid, pancreas and adrenal glands, resulting in inflammation of these glands, which can affect their production of certain hormones. Some hormone levels can be monitored with blood work. It is important that you report any changes in how  you are  feeling to your care team. Symptoms of these hormonal changes can include: headaches, nausea, vomiting, constipation, rapid heart rate, increased sweating, extreme fatigue, weakness, changes in your voice, changes in memory and concentration, increased hunger or thirst, increased urination, weight gain, hair loss, dizziness, feeling cold all the time, and changes in mood or behavior (including irritability, forgetfulness and decreased sex drive).    Infusion-Related Side Effects: The infusion can cause a reaction that may lead to chills, fever, low blood pressure, dizziness, feeling like you are going to pass out and difficulty breathing. Let your nurse know right away if you are experiencing any changes in how you are feeling.    Fatigue  Fatigue is very common during cancer treatment and is an overwhelming feeling of exhaustion that is not usually relieved by rest. While on cancer treatment, and for a period after, you may need to adjust your schedule to manage fatigue. Plan times to rest during the day and conserve energy for more important activities. Exercise can help combat fatigue; a simple daily walk with a friend can help. Talk to your healthcare team for helpful tips on dealing with this side effect.    Reproductive Concerns  Exposure of an unborn child to this medication could cause birth defects, so you should not become pregnant or father a child while on this medication. Effective birth control is necessary during treatment and for 3 months after the last dose. Even if your menstrual cycle stops or you believe you are not producing sperm, you could still be fertile and conceive. You should not breastfeed while receiving this medication and for 3 months after the last dose.      When to Call the Doctor   Call your doctor or nurse if you have any of these symptoms and/or any new or unusual symptoms:    Fever of 100.4 F (38 C) or higher    Chills    Easy bleeding or bruising     Wheezing or trouble breathing    Dry cough, or cough with yellow, green or bloody mucus    Confusion and/or agitation    Hallucinations    Trouble understanding or speaking    Blurry vision or changes in your eyesight    Numbness or lack of strength to your arms, legs, face, or body    Feeling dizzy or lightheaded    Loose bowel movements (diarrhea) more than 4 times a day or diarrhea with weakness or lightheadedness    Nausea that stops you from eating or drinking, and/or that is not relieved by prescribed medicines    Lasting loss of appetite or rapid weight loss of five pounds in a week    No bowel movement for 3 days or you feel uncomfortable    Bad abdominal pain, especially in upper right area    Fatigue or extreme weakness that interferes with normal activities    Decreased urine    Unusual thirst or passing urine often    Rash that is not relieved by prescribed medicines    Rash or itching    Flu-like symptoms: fever, headache, muscle and joint aches, and fatigue (low energy, feeling weak)    Signs of liver problems: dark urine, pale bowel movements, bad stomach pain, feeling very tired and weak, unusual itching, or yellowing of the eyes or skin.    Signs of infusion reaction: fever or shaking chills, flushing, facial swelling, feeling dizzy, headache, trouble breathing, rash, itching, chest tightness, or  chest pain.   Precautions:  Before starting Yervoy treatment, make sure you tell your doctor about any other medications you are taking (including prescription, over-the-counter, vitamins, herbal remedies, etc.).  Do not receive any kind of immunization or vaccination without your doctor's approval while taking Yervoy. Inform your health care professional if you are pregnant or may be pregnant prior to starting this treatment. Pregnancy category C [name] may be hazardous to the fetus. Women who are pregnant or become pregnant must be advised of the potential  hazard to the fetus.) For both men and women: Do not conceive a child (get pregnant) while taking Yervoy. Barrier methods of contraception, such as condoms, are recommended. Discuss with your doctor when you may safely become pregnant or conceive a child after therapy. Do not breast feed while taking this medication.      SELF CARE ACTIVITIES WHILE ON IMMUNOTHERAPY:   Hydration Increase your fluid intake 48 hours prior to treatment and drink at least 8 to 12 cups (64 ounces) of water/decaffeinated beverages per day after treatment. You can still have your cup of coffee or soda but these beverages do not count as part of your 8 to 12 cups that you need to drink daily. No alcohol intake.   Medications Continue taking your normal prescription medication as prescribed.  If you start any new herbal or new supplements please let us know first to make sure it is safe.   Infection Prevention Please wash your hands for at least 30 seconds using warm soapy water. Handwashing is the #1 way to prevent the spread of germs. Stay away from sick people or people who are getting over a cold. If you develop respiratory systems such as green/yellow mucus production or productive cough or persistent cough let us know and we will see if you need an antibiotic. It is a good idea to keep a pair of gloves on when going into grocery stores/[employer] to decrease your risk of coming into contact with germs on the carts, etc. Carry alcohol hand gel with you at all times and use it frequently if out in public. If your temperature reaches 100.4 or higher please call the clinic and let us know.  If it is after hours or on the weekend please go to the ER if your temperature is over 100.4.  Please have your own personal thermometer at home to use.     Sex and bodily fluids If you are going to have sex, a condom must be used to protect the person that isn't taking immunotherapy. For a few days after treatment, immunotherapy can be  excreted through your bodily fluids.  When using the toilet please close the lid and flush the toilet twice.  Do this for a few day after you have had immunotherapy.    Contraception It is not known for sure whether or not immunotherapy drugs can be passed on through semen or secretions from the vagina. Because of this some doctors advise people to use a barrier method if you have sex during treatment. This applies to vaginal, anal or oral sex.   Generally, doctors advise a barrier method only for the time you are actually having the treatment and for about a week after your treatment.   Advice like this can be worrying, but this does not mean that you have to avoid being intimate with your partner. You can still have close contact with your partner and continue to enjoy sex.   Animals If you have  cats or birds we just ask that you not change the litter or change the cage.  Please have someone else do this for you while you are on immunotherapy.    Food Safety During and After Cancer Treatment Food safety is important for people both during and after cancer treatment. Cancer and cancer treatments, such as chemotherapy, radiation therapy, and stem cell/bone marrow transplantation, often weaken the immune system. This makes it harder for your body to protect itself from foodborne illness, also called food poisoning. Foodborne illness is caused by eating food that contains harmful bacteria, parasites, or viruses.   Foods to avoid Some foods have a higher risk of becoming tainted with bacteria. These include: Unwashed fresh fruit and vegetables, especially leafy vegetables that can hide dirt and other contaminants Raw sprouts, such as alfalfa sprouts Raw or undercooked beef, especially ground beef, or other raw or undercooked meat and poultry Fatty, fried, or spicy foods immediately before or after treatment.  These can sit heavy on your stomach and make you feel nauseous. Raw or undercooked  shellfish, such as oysters. Sushi and sashimi, which often contain raw fish.  Unpasteurized beverages, such as unpasteurized fruit juices, raw milk, raw yogurt, or cider Undercooked eggs, such as soft boiled, over easy, and poached; raw, unpasteurized eggs; or foods made with raw egg, such as homemade raw cookie dough and homemade mayonnaise   Simple steps for food safety   Shop smart. Do not buy food stored or displayed in an unclean area. Do not buy bruised or damaged fruits or vegetables. Do not buy cans that have cracks, dents, or bulges. Pick up foods that can spoil at the end of your shopping trip and store them in a cooler on the way home.   Prepare and clean up foods carefully. Rinse all fresh fruits and vegetables under running water, and dry them with a clean towel or paper towel. Clean the top of cans before opening them. After preparing food, wash your hands for 20 seconds with hot water and soap. Pay special attention to areas between fingers and under nails. Clean your utensils and dishes with hot water and soap. Disinfect your kitchen and cutting boards using 1 teaspoon of liquid, unscented bleach mixed into 1 quart of water.     Dispose of old food. Eat canned and packaged food before its expiration date (the "use by" or "best before" date). Consume refrigerated leftovers within 3 to 4 days. After that time, throw out the food. Even if the food does not smell or look spoiled, it still may be unsafe. Some bacteria, such as Listeria, can grow even on foods stored in the refrigerator if they are kept for too long.   Take precautions when eating out. At restaurants, avoid buffets and salad bars where food sits out for a long time and comes in contact with many people. Food can become contaminated when someone with a virus, often a norovirus, or another "bug" handles it. Put any leftover food in a "to-go" container yourself, rather than having the server do it. And, refrigerate  leftovers as soon as you get home. Choose restaurants that are clean and that are willing to prepare your food as you order it cooked.       Wear comfortable clothing and clothing appropriate for easy access to any Portacath or PICC line. Let us know if there is anything that we can do to make your therapy better!     What to do if you  need assistance after hours or on the weekends: CALL [phone].  HOLD on the line, do not hang up.  You will hear multiple messages but at the end you will be connected with a nurse triage line.  They will contact the doctor if necessary.  Most of the time they will be able to assist you.  Do not call the hospital operator.     I have been informed and understand all of the instructions given to me and have received a copy. I have been instructed to call the clinic [phone] or my family physician as soon as possible for continued medical care, if indicated. I do not have any more questions at this time but understand that I may call the [facility] or the Patient Navigator at [phone] during office hours should I have questions or need assistance in obtaining follow-up care.

## 2022-12-18 ENCOUNTER — Other Ambulatory Visit: Payer: Self-pay | Admitting: *Deleted

## 2022-12-18 ENCOUNTER — Inpatient Hospital Stay: Payer: Medicare Other

## 2022-12-18 DIAGNOSIS — C3432 Malignant neoplasm of lower lobe, left bronchus or lung: Secondary | ICD-10-CM | POA: Diagnosis not present

## 2022-12-18 DIAGNOSIS — Z87891 Personal history of nicotine dependence: Secondary | ICD-10-CM | POA: Diagnosis not present

## 2022-12-18 DIAGNOSIS — E119 Type 2 diabetes mellitus without complications: Secondary | ICD-10-CM | POA: Diagnosis not present

## 2022-12-18 DIAGNOSIS — Z79620 Long term (current) use of immunosuppressive biologic: Secondary | ICD-10-CM | POA: Diagnosis not present

## 2022-12-18 DIAGNOSIS — C3492 Malignant neoplasm of unspecified part of left bronchus or lung: Secondary | ICD-10-CM

## 2022-12-18 DIAGNOSIS — Z5112 Encounter for antineoplastic immunotherapy: Secondary | ICD-10-CM | POA: Diagnosis not present

## 2022-12-18 DIAGNOSIS — C7971 Secondary malignant neoplasm of right adrenal gland: Secondary | ICD-10-CM | POA: Diagnosis not present

## 2022-12-18 DIAGNOSIS — C7972 Secondary malignant neoplasm of left adrenal gland: Secondary | ICD-10-CM | POA: Diagnosis not present

## 2022-12-18 DIAGNOSIS — C7951 Secondary malignant neoplasm of bone: Secondary | ICD-10-CM | POA: Diagnosis not present

## 2022-12-18 DIAGNOSIS — Z79899 Other long term (current) drug therapy: Secondary | ICD-10-CM | POA: Diagnosis not present

## 2022-12-18 LAB — COMPREHENSIVE METABOLIC PANEL
ALT: 16 U/L (ref 0–44)
AST: 15 U/L (ref 15–41)
Albumin: 2.9 g/dL — ABNORMAL LOW (ref 3.5–5.0)
Alkaline Phosphatase: 69 U/L (ref 38–126)
Anion gap: 11 (ref 5–15)
BUN: 37 mg/dL — ABNORMAL HIGH (ref 8–23)
CO2: 26 mmol/L (ref 22–32)
Calcium: 8.4 mg/dL — ABNORMAL LOW (ref 8.9–10.3)
Chloride: 102 mmol/L (ref 98–111)
Creatinine, Ser: 1.34 mg/dL — ABNORMAL HIGH (ref 0.61–1.24)
GFR, Estimated: 52 mL/min — ABNORMAL LOW (ref >60)
Glucose, Bld: 141 mg/dL — ABNORMAL HIGH (ref 70–99)
Potassium: 4 mmol/L (ref 3.5–5.1)
Sodium: 139 mmol/L (ref 135–145)
Total Bilirubin: 0.7 mg/dL (ref 0.3–1.2)
Total Protein: 6.3 g/dL — ABNORMAL LOW (ref 6.5–8.1)

## 2022-12-18 LAB — CBC WITH DIFFERENTIAL/PLATELET
Abs Immature Granulocytes: 0.05 10*3/uL (ref 0.00–0.07)
Basophils Absolute: 0 10*3/uL (ref 0.0–0.1)
Basophils Relative: 1 %
Eosinophils Absolute: 0.1 10*3/uL (ref 0.0–0.5)
Eosinophils Relative: 1 %
HCT: 27.4 % — ABNORMAL LOW (ref 39.0–52.0)
Hemoglobin: 8.3 g/dL — ABNORMAL LOW (ref 13.0–17.0)
Immature Granulocytes: 1 %
Lymphocytes Relative: 10 %
Lymphs Abs: 0.5 10*3/uL — ABNORMAL LOW (ref 0.7–4.0)
MCH: 29.4 pg (ref 26.0–34.0)
MCHC: 30.3 g/dL (ref 30.0–36.0)
MCV: 97.2 fL (ref 80.0–100.0)
Monocytes Absolute: 0.3 10*3/uL (ref 0.1–1.0)
Monocytes Relative: 6 %
Neutro Abs: 4.6 10*3/uL (ref 1.7–7.7)
Neutrophils Relative %: 81 %
Platelets: 132 10*3/uL — ABNORMAL LOW (ref 150–400)
RBC: 2.82 MIL/uL — ABNORMAL LOW (ref 4.22–5.81)
RDW: 19.8 % — ABNORMAL HIGH (ref 11.5–15.5)
WBC: 5.6 10*3/uL (ref 4.0–10.5)
nRBC: 0 % (ref 0.0–0.2)

## 2022-12-18 LAB — MAGNESIUM: Magnesium: 1.5 mg/dL — ABNORMAL LOW (ref 1.7–2.4)

## 2022-12-18 LAB — TSH: TSH: 2.674 u[IU]/mL (ref 0.350–4.500)

## 2022-12-18 MED ORDER — HYDROMORPHONE HCL 2 MG PO TABS: 2.0000 mg | ORAL_TABLET | Freq: Four times a day (QID) | ORAL | 0 refills | Status: DC | PRN

## 2022-12-18 NOTE — Progress Notes (Signed)
Chemotherapy/Immunotherapy education packet given and discussed with pt and family in detail.  Discussed diagnosis and staging, tx regimen, and intent of tx.  Reviewed chemotherapy/Immunotherapy medications and side effects, as well as pre-medications.  Instructed on how to manage side effects at home, and when to call the clinic.  Importance of fever/chills discussed with pt and family. Discussed precautions to implement at home after receiving tx, as well as self care strategies. Phone numbers provided for clinic during regular working hours, also how to reach the clinic after hours and on weekends. Pt and family provided the opportunity to ask questions - all questions answered to pt's and family satisfaction.

## 2022-12-19 ENCOUNTER — Inpatient Hospital Stay: Payer: Medicare Other

## 2022-12-19 ENCOUNTER — Inpatient Hospital Stay: Payer: Medicare Other | Admitting: Licensed Clinical Social Worker

## 2022-12-19 VITALS — BP 114/73 | HR 91 | Temp 98.6°F | Resp 18 | Wt 233.0 lb

## 2022-12-19 DIAGNOSIS — Z5112 Encounter for antineoplastic immunotherapy: Secondary | ICD-10-CM | POA: Diagnosis not present

## 2022-12-19 DIAGNOSIS — C7971 Secondary malignant neoplasm of right adrenal gland: Secondary | ICD-10-CM | POA: Diagnosis not present

## 2022-12-19 DIAGNOSIS — Z87891 Personal history of nicotine dependence: Secondary | ICD-10-CM | POA: Diagnosis not present

## 2022-12-19 DIAGNOSIS — C3492 Malignant neoplasm of unspecified part of left bronchus or lung: Secondary | ICD-10-CM

## 2022-12-19 DIAGNOSIS — C3432 Malignant neoplasm of lower lobe, left bronchus or lung: Secondary | ICD-10-CM | POA: Diagnosis not present

## 2022-12-19 DIAGNOSIS — Z79899 Other long term (current) drug therapy: Secondary | ICD-10-CM | POA: Diagnosis not present

## 2022-12-19 DIAGNOSIS — Z79620 Long term (current) use of immunosuppressive biologic: Secondary | ICD-10-CM | POA: Diagnosis not present

## 2022-12-19 DIAGNOSIS — C7972 Secondary malignant neoplasm of left adrenal gland: Secondary | ICD-10-CM | POA: Diagnosis not present

## 2022-12-19 DIAGNOSIS — C7951 Secondary malignant neoplasm of bone: Secondary | ICD-10-CM | POA: Diagnosis not present

## 2022-12-19 MED ORDER — DIPHENHYDRAMINE HCL 50 MG/ML IJ SOLN: 25.0000 mg | Freq: Once | INTRAMUSCULAR | Status: DC

## 2022-12-19 MED ORDER — MAGNESIUM SULFATE 2 GM/50ML IV SOLN
2.0000 g | Freq: Once | INTRAVENOUS | Status: AC
  Administered 2022-12-19: 2 g via INTRAVENOUS
  Filled 2022-12-19: qty 50

## 2022-12-19 MED ORDER — CETIRIZINE HCL 10 MG/ML IV SOLN
10.0000 mg | Freq: Once | INTRAVENOUS | Status: AC
  Administered 2022-12-19: 10 mg via INTRAVENOUS
  Filled 2022-12-19: qty 1

## 2022-12-19 MED ORDER — FAMOTIDINE IN NACL 20-0.9 MG/50ML-% IV SOLN
20.0000 mg | Freq: Once | INTRAVENOUS | Status: AC
  Administered 2022-12-19: 20 mg via INTRAVENOUS
  Filled 2022-12-19: qty 50

## 2022-12-19 MED ORDER — SODIUM CHLORIDE 0.9 % IV SOLN
1.0000 mg/kg | Freq: Once | INTRAVENOUS | Status: AC
  Administered 2022-12-19: 100 mg via INTRAVENOUS
  Filled 2022-12-19: qty 20

## 2022-12-19 MED ORDER — SODIUM CHLORIDE 0.9 % IV SOLN
3.0600 mg/kg | Freq: Once | INTRAVENOUS | Status: AC
  Administered 2022-12-19: 280 mg via INTRAVENOUS
  Filled 2022-12-19: qty 24

## 2022-12-19 MED ORDER — SODIUM CHLORIDE 0.9 % IV SOLN: Freq: Once | INTRAVENOUS | Status: AC

## 2022-12-19 MED ORDER — SODIUM CHLORIDE 0.9% FLUSH
10.0000 mL | Freq: Once | INTRAVENOUS | Status: AC
  Administered 2022-12-19: 10 mL via INTRAVENOUS

## 2022-12-19 MED ORDER — HEPARIN SOD (PORK) LOCK FLUSH 100 UNIT/ML IV SOLN
500.0000 [IU] | Freq: Once | INTRAVENOUS | Status: AC
  Administered 2022-12-19: 500 [IU] via INTRAVENOUS

## 2022-12-19 NOTE — Patient Instructions (Signed)
[facility]  Discharge Instructions: Thank you for choosing [facility] to provide your oncology and hematology care.  If you have a lab appointment with the Cancer Center, please come in thru the Main Entrance and check in at the main information desk.  Wear comfortable clothing and clothing appropriate for easy access to any Portacath or PICC line.   We strive to give you quality time with your provider. You may need to reschedule your appointment if you arrive late (15 or more minutes).  Arriving late affects you and other patients whose appointments are after yours.  Also, if you miss three or more appointments without notifying the office, you may be dismissed from the clinic at the provider's discretion.      For prescription refill requests, have your pharmacy contact our office and allow 72 hours for refills to be completed.    Today you received the following chemotherapy and/or immunotherapy agents Opdivo/Yervoy      To help prevent nausea and vomiting after your treatment, we encourage you to take your nausea medication as directed.  BELOW ARE SYMPTOMS THAT SHOULD BE REPORTED IMMEDIATELY: *FEVER GREATER THAN 100.4 F (38 C) OR HIGHER *CHILLS OR SWEATING *NAUSEA AND VOMITING THAT IS NOT CONTROLLED WITH YOUR NAUSEA MEDICATION *UNUSUAL SHORTNESS OF BREATH *UNUSUAL BRUISING OR BLEEDING *URINARY PROBLEMS (pain or burning when urinating, or frequent urination) *BOWEL PROBLEMS (unusual diarrhea, constipation, pain near the anus) TENDERNESS IN MOUTH AND THROAT WITH OR WITHOUT PRESENCE OF ULCERS (sore throat, sores in mouth, or a toothache) UNUSUAL RASH, SWELLING OR PAIN  UNUSUAL VAGINAL DISCHARGE OR ITCHING   Items with * indicate a potential emergency and should be followed up as soon as possible or go to the Emergency Department if any problems should occur.  Please show the CHEMOTHERAPY ALERT CARD or IMMUNOTHERAPY ALERT CARD at check-in to the  Emergency Department and triage nurse.  Should you have questions after your visit or need to cancel or reschedule your appointment, please contact [facility] [phone]  and follow the prompts.  Office hours are 8:00 a.m. to 4:30 p.m. Monday - Friday. Please note that voicemails left after 4:00 p.m. may not be returned until the following business day.  We are closed weekends and major holidays. You have access to a nurse at all times for urgent questions. Please call the main number to the clinic [phone] and follow the prompts.  For any non-urgent questions, you may also contact your provider using MyChart. We now offer e-Visits for anyone 18 and older to request care online for non-urgent symptoms. For details visit [email or web address].   Also download the MyChart app! Go to the app store, search "MyChart", open the app, select [facility], and log in with your MyChart username and password.   

## 2022-12-19 NOTE — Progress Notes (Signed)
Pharmacist Chemotherapy Monitoring - Initial Assessment    Anticipated start date: 12/19/22    The following has been reviewed per standard work regarding the patient's treatment regimen: The patient's diagnosis, treatment plan and drug doses, and organ/hematologic function Lab orders and baseline tests specific to treatment regimen  The treatment plan start date, drug sequencing, and pre-medications Prior authorization status  Patient's documented medication list, including drug-drug interaction screen and prescriptions for anti-emetics and supportive care specific to the treatment regimen The drug concentrations, fluid compatibility, administration routes, and timing of the medications to be used The patient's access for treatment and lifetime cumulative dose history, if applicable  The patient's medication allergies and previous infusion related reactions, if applicable   Changes made to treatment plan:  pre-medications . Discontinue diphenhydramine from oncology treatment plan --> Add Quzyttir (cetirizine) 10 mg IVPush x 1 as premedication for oncology treatment plan.  T.O. Dr Rhys Martini, PharmD   Follow up needed:  [place], [facility], 12/19/2022  8:11 AM

## 2022-12-19 NOTE — Progress Notes (Signed)
Patient presents today for D1C1 Opdivo/Yervoy infusions per providers order.  Vital signs and labs within parameters for treatment.  Patient will also receive 2 grams IV Magnesium for Mag level of 1.5.    Ipilimumab (YERVOY) Patient Monitoring Assessment   Is the patient experiencing any of the following general symptoms?:  [N ]Difficulty performing normal activities [N ]Feeling sluggish or cold all the time [N ]Unusual weight gain [N ]Constant or unusual headaches [N ]Feeling dizzy or faint [N ]Changes in eyesight (blurry vision, double vision, or other vision problems) [N ]Changes in mood or behavior (ex: decreased sex drive, irritability, or forgetfulness) [N ]Starting new medications (ex: steroids, other medications that lower immune response) [N ]Patient is not experiencing any of the general symptoms above.   Gastrointestinal  Patient is having 1 bowel movements each day.  Is this different from baseline? [ ] Yes [X] No Are your stools watery or do they have a foul smell? [ ] Yes [X] No Have you seen blood in your stools? [ ] Yes [X] No Are your stools dark, tarry, or sticky? [ ] Yes [X] No Are you having pain or tenderness in your belly? [ ] Yes [X] No  Skin Does your skin itch? [ ] Yes [X] No Do you have a rash? [ ] Yes [X] No Has your skin blistered and/or peeled? [ ] Yes [X] No Do you have sores in your mouth? [ ] Yes [X] No  Hepatic Has your urine been dark or tea colored? [ ] Yes [X] No Have you noticed that your skin or the whites of your eyes are turning yellow? [ ] Yes [X] No Are you bleeding or bruising more easily than normal? [ ] Yes [X] No Are you nauseous and/or vomiting? [ ] Yes [X] No Do you have pain on the right side of your stomach? [ ] Yes [X] No  Neurologic  Are you having unusual weakness of legs, arms, or face? [ ] Yes [X] No Are you having numbness or tingling in your hands or feet? [ ] Yes [X] No  Marcelino Scot, [facility]  Treatment given today per MD orders.  Stable during  infusion without adverse affects.  Vital signs stable.  No complaints at this time.  Discharge from clinic ambulatory in stable condition.  Alert and oriented X 3.  Follow up with [facility] as scheduled.

## 2022-12-20 ENCOUNTER — Encounter (HOSPITAL_COMMUNITY): Payer: Self-pay | Admitting: Hematology

## 2022-12-20 ENCOUNTER — Encounter: Payer: Self-pay | Admitting: Hematology

## 2022-12-20 NOTE — Progress Notes (Signed)
24 hour call back. Patient stated "he was having a terrible day" , denies any nausea or vomiting, denies diarrhea, states he has no energy but he says it was this way before treatment. He states he is eating and drinking whatever his wife is fixing. He asked if he could see a provider before Thursday which was his original follow up. Nurse contacted scheduler and she notified pt of the new appt.

## 2022-12-20 NOTE — Progress Notes (Signed)
[name]  Initial Assessment   [name] is a 88 y.o. year old male presenting alone. Clinical Social [name] was referred by medical provider for assessment of psychosocial needs.   SDOH (Social Determinants of Health) assessments performed: Yes   SDOH Screenings   Food Insecurity: No Food Insecurity (11/30/2022)  Housing: Low Risk  (11/30/2022)  Transportation Needs: No Transportation Needs (11/30/2022)  Utilities: Not At Risk (11/30/2022)  Tobacco Use: Low Risk  (11/30/2022)     Distress Screen completed: No     No data to display            Family/Social Information:  Housing Arrangement: patient lives with his wife [name]. Family members/support persons in your life? Pt's daughter and son in law reside close by and assist as they are able.  Pt was recently discharged from the hospital and was referred for home care.  Prior to hospitalization pt reportedly was very independent and active, continuing to do yard [name].  Since hospitalization pt is physically deconditioned and is using a walker and wheelchair for ambulation.  Pt's wife has not been receptive to home care as she does not want someone inside her home.  CSW spoke w/ pt and pt's daughter [name] [phone]) via phone to assess and discussed the benefits at length with pt to encourage accepting home care services.  Pt requesting CSW speak with his wife and daughter together regarding the issue as his wife will need to agree to it.  Pt also expressed concerns about his wife's mobility as she has chronic issues with her hip and he believes she would also benefit from home physical therapy.  Pt's daughter verbalized agreement to talk to her step mother regarding the issue and will set up a time for a conference call.   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  Income source: Paediatric nurse  concerns: No Type of concern: None Food access concerns: no Religious or spiritual practice: Hydrographic surveyor Currently in place:  none  Coping/ Adjustment to diagnosis: Patient understands treatment plan and what happens next? yes Concerns about diagnosis and/or treatment: Overwhelmed by information, How will I care for myself, and Quality of life Patient reported stressors: Adjusting to my illness and Physical issues Hopes and/or priorities: Pt's priority is to continue with treatment w/ the hopes of regaining physical strength. Patient enjoys being outside Current coping skills/ strengths: Motivation for treatment/growth  and Supportive family/friends     SUMMARY: Current SDOH Barriers:  Limited education about his multiple medical conditions*  Clinical Social [name] Clinical Goal(s):  To encourage pt to accept and receive supportive medical services to aid with recovery  Interventions: Discussed common feeling and emotions when being diagnosed with cancer, and the importance of support during treatment Informed patient of the support team roles and support services at [facility] Provided [facility] contact information and encouraged patient to call with any questions or concerns Referred patient to [facility].  Will speak with pt's wife and daughter regarding home care.   Follow Up Plan:  Pt's daughter will contact CSW to set up a time for a conference call. Patient verbalizes understanding of plan: Yes    Henriette Combs, LCSW

## 2022-12-21 ENCOUNTER — Encounter (HOSPITAL_COMMUNITY): Payer: Self-pay

## 2022-12-21 ENCOUNTER — Emergency Department (HOSPITAL_COMMUNITY)
Admission: EM | Admit: 2022-12-21 | Discharge: 2022-12-22 | Disposition: A | Discharge status: Home / Self Care | Payer: Medicare Other | Attending: Emergency Medicine | Admitting: Emergency Medicine

## 2022-12-21 ENCOUNTER — Emergency Department (HOSPITAL_COMMUNITY): Payer: Medicare Other

## 2022-12-21 DIAGNOSIS — I700 Atherosclerosis of aorta: Secondary | ICD-10-CM | POA: Diagnosis not present

## 2022-12-21 DIAGNOSIS — R600 Localized edema: Secondary | ICD-10-CM | POA: Diagnosis not present

## 2022-12-21 DIAGNOSIS — Z85118 Personal history of other malignant neoplasm of bronchus and lung: Secondary | ICD-10-CM | POA: Diagnosis not present

## 2022-12-21 DIAGNOSIS — R0682 Tachypnea, not elsewhere classified: Secondary | ICD-10-CM | POA: Insufficient documentation

## 2022-12-21 DIAGNOSIS — Z7982 Long term (current) use of aspirin: Secondary | ICD-10-CM | POA: Diagnosis not present

## 2022-12-21 DIAGNOSIS — Z91040 Latex allergy status: Secondary | ICD-10-CM | POA: Diagnosis not present

## 2022-12-21 DIAGNOSIS — C3490 Malignant neoplasm of unspecified part of unspecified bronchus or lung: Secondary | ICD-10-CM | POA: Diagnosis not present

## 2022-12-21 DIAGNOSIS — Z79899 Other long term (current) drug therapy: Secondary | ICD-10-CM | POA: Diagnosis not present

## 2022-12-21 DIAGNOSIS — R911 Solitary pulmonary nodule: Secondary | ICD-10-CM | POA: Diagnosis not present

## 2022-12-21 DIAGNOSIS — R2243 Localized swelling, mass and lump, lower limb, bilateral: Secondary | ICD-10-CM | POA: Diagnosis not present

## 2022-12-21 DIAGNOSIS — R0602 Shortness of breath: Secondary | ICD-10-CM | POA: Insufficient documentation

## 2022-12-21 DIAGNOSIS — R000 Tachycardia, unspecified: Secondary | ICD-10-CM | POA: Diagnosis not present

## 2022-12-21 LAB — COMPREHENSIVE METABOLIC PANEL
ALT: 18 U/L (ref 0–44)
AST: 17 U/L (ref 15–41)
Albumin: 3.2 g/dL — ABNORMAL LOW (ref 3.5–5.0)
Alkaline Phosphatase: 69 U/L (ref 38–126)
Anion gap: 10 (ref 5–15)
BUN: 39 mg/dL — ABNORMAL HIGH (ref 8–23)
CO2: 24 mmol/L (ref 22–32)
Calcium: 8.6 mg/dL — ABNORMAL LOW (ref 8.9–10.3)
Chloride: 100 mmol/L (ref 98–111)
Creatinine, Ser: 1.32 mg/dL — ABNORMAL HIGH (ref 0.61–1.24)
GFR, Estimated: 53 mL/min — ABNORMAL LOW (ref >60)
Glucose, Bld: 224 mg/dL — ABNORMAL HIGH (ref 70–99)
Potassium: 4.3 mmol/L (ref 3.5–5.1)
Sodium: 134 mmol/L — ABNORMAL LOW (ref 135–145)
Total Bilirubin: 0.7 mg/dL (ref 0.3–1.2)
Total Protein: 6.4 g/dL — ABNORMAL LOW (ref 6.5–8.1)

## 2022-12-21 LAB — CBC WITH DIFFERENTIAL/PLATELET
Abs Immature Granulocytes: 0.04 10*3/uL (ref 0.00–0.07)
Basophils Absolute: 0 10*3/uL (ref 0.0–0.1)
Basophils Relative: 0 %
Eosinophils Absolute: 0.1 10*3/uL (ref 0.0–0.5)
Eosinophils Relative: 1 %
HCT: 27.3 % — ABNORMAL LOW (ref 39.0–52.0)
Hemoglobin: 8.3 g/dL — ABNORMAL LOW (ref 13.0–17.0)
Immature Granulocytes: 1 %
Lymphocytes Relative: 14 %
Lymphs Abs: 0.6 10*3/uL — ABNORMAL LOW (ref 0.7–4.0)
MCH: 29.7 pg (ref 26.0–34.0)
MCHC: 30.4 g/dL (ref 30.0–36.0)
MCV: 97.8 fL (ref 80.0–100.0)
Monocytes Absolute: 0.3 10*3/uL (ref 0.1–1.0)
Monocytes Relative: 8 %
Neutro Abs: 3.1 10*3/uL (ref 1.7–7.7)
Neutrophils Relative %: 76 %
Platelets: 135 10*3/uL — ABNORMAL LOW (ref 150–400)
RBC: 2.79 MIL/uL — ABNORMAL LOW (ref 4.22–5.81)
RDW: 19.1 % — ABNORMAL HIGH (ref 11.5–15.5)
WBC: 4.2 10*3/uL (ref 4.0–10.5)
nRBC: 0 % (ref 0.0–0.2)

## 2022-12-21 LAB — BRAIN NATRIURETIC PEPTIDE: B Natriuretic Peptide: 202 pg/mL — ABNORMAL HIGH (ref 0.0–100.0)

## 2022-12-21 LAB — LACTIC ACID, PLASMA: Lactic Acid, Venous: 1.3 mmol/L (ref 0.5–1.9)

## 2022-12-21 LAB — TROPONIN I (HIGH SENSITIVITY): Troponin I (High Sensitivity): 15 ng/L (ref <18)

## 2022-12-21 MED ORDER — NYSTATIN 100000 UNIT/GM EX POWD
Freq: Once | CUTANEOUS | Status: AC
  Filled 2022-12-21: qty 15

## 2022-12-21 MED ORDER — IOHEXOL 350 MG/ML SOLN
75.0000 mL | Freq: Once | INTRAVENOUS | Status: AC | PRN
  Administered 2022-12-21: 75 mL via INTRAVENOUS

## 2022-12-21 MED ORDER — LIDOCAINE-PRILOCAINE 2.5-2.5 % EX CREA
TOPICAL_CREAM | Freq: Once | CUTANEOUS | Status: AC
  Administered 2022-12-21: 1 via TOPICAL
  Filled 2022-12-21: qty 5

## 2022-12-21 NOTE — ED Triage Notes (Signed)
Patient reports feeling short of breath and having swelling "all over" for the past few weeks. Notices the swelling more in his legs. Currently being treated for cancer with chemo and radiation. Oxygen is 94% on room air in triage. Also reports pain in his groin area.

## 2022-12-21 NOTE — ED Notes (Signed)
Call to lab as blood was drawn when pt went to xray at 2145, they have not received it.  Phlebotomist is taking it over to lab now.

## 2022-12-21 NOTE — ED Notes (Signed)
Pt has stage 4 lung cancer with mets to his left femur, vertebrae and ribs and right shoulder.  Pt received his first immunotherapy treatment on Thursday (dx was 10/29/22).   Today pt had bilateral leg swelling today, to the point of having a difficult time to bend his legs. Pt also had sob.  Triage RN for oncologist advised them to come to ED.  Pt was brought back toward room and then taken to radiology and will return after xray.

## 2022-12-21 NOTE — ED Notes (Signed)
Family member requests EMLA cream for port access.

## 2022-12-21 NOTE — ED Notes (Signed)
Pt undressed and placed on monitor.  Pt has some swelling of his foreskin and requests evaluation as he feels that it is causing difficulty urinating.

## 2022-12-21 NOTE — ED Notes (Signed)
Patient has a port

## 2022-12-21 NOTE — ED Notes (Signed)
Patient transported to X-ray 

## 2022-12-22 DIAGNOSIS — I700 Atherosclerosis of aorta: Secondary | ICD-10-CM | POA: Diagnosis not present

## 2022-12-22 DIAGNOSIS — C3490 Malignant neoplasm of unspecified part of unspecified bronchus or lung: Secondary | ICD-10-CM | POA: Diagnosis not present

## 2022-12-22 LAB — URINALYSIS, ROUTINE W REFLEX MICROSCOPIC
Bacteria, UA: NONE SEEN
Bilirubin Urine: NEGATIVE
Glucose, UA: 50 mg/dL — AB
Hgb urine dipstick: NEGATIVE
Ketones, ur: NEGATIVE mg/dL
Nitrite: NEGATIVE
Protein, ur: NEGATIVE mg/dL
Specific Gravity, Urine: 1.016 (ref 1.005–1.030)
pH: 5 (ref 5.0–8.0)

## 2022-12-22 LAB — CBG MONITORING, ED
Glucose-Capillary: 69 mg/dL — ABNORMAL LOW (ref 70–99)
Glucose-Capillary: 123 mg/dL — ABNORMAL HIGH (ref 70–99)

## 2022-12-22 LAB — TROPONIN I (HIGH SENSITIVITY): Troponin I (High Sensitivity): 15 ng/L (ref <18)

## 2022-12-22 MED ORDER — IPRATROPIUM-ALBUTEROL 0.5-2.5 (3) MG/3ML IN SOLN: 3.0000 mL | Freq: Once | RESPIRATORY_TRACT | Status: DC

## 2022-12-22 MED ORDER — FUROSEMIDE 10 MG/ML IJ SOLN
20.0000 mg | Freq: Once | INTRAMUSCULAR | Status: AC
  Administered 2022-12-22: 20 mg via INTRAVENOUS
  Filled 2022-12-22: qty 2

## 2022-12-22 MED ORDER — PROPRANOLOL HCL 10 MG PO TABS
20.0000 mg | ORAL_TABLET | Freq: Once | ORAL | Status: AC
  Administered 2022-12-22: 20 mg via ORAL
  Filled 2022-12-22: qty 2

## 2022-12-22 MED ORDER — FUROSEMIDE 20 MG PO TABS: 20.0000 mg | ORAL_TABLET | Freq: Every day | ORAL | 0 refills | Status: DC | PRN

## 2022-12-22 MED ORDER — IPRATROPIUM-ALBUTEROL 0.5-2.5 (3) MG/3ML IN SOLN
3.0000 mL | Freq: Once | RESPIRATORY_TRACT | Status: AC
  Administered 2022-12-22: 3 mL via RESPIRATORY_TRACT
  Filled 2022-12-22: qty 3

## 2022-12-22 NOTE — Progress Notes (Signed)
[facility] [address], [place] [address]    Clinic Day:  12/23/2022  Referring physician: Celene Squibb, MD  Patient Care Team: Celene Squibb, MD as PCP - General Freada Bergeron, MD as PCP - Cardiology (Cardiology) Rogene Houston, MD as Consulting Physician (Gastroenterology) Sharmon Revere as Physician Assistant (Cardiology) Derek Jack, MD as Medical Oncologist (Medical Oncology) Brien Mates, RN as Oncology Nurse Navigator (Medical Oncology)   ASSESSMENT & PLAN:   Assessment: 1.  Metastatic poorly differentiated adenocarcinoma of the lung to the bones and adrenals: - Presentation with cough 3 months, mild hemoptysis 3 weeks.  No weight loss. - Lives at home with his wife.  Independent of ADLs and IADLs.  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  Smoked for 1 year, 1 to 2 cigarettes/day.  3 sisters had breast cancer.  2 brothers had lung cancer. - CT CAP (10/26/2022): 1.4 cm left lower lobe lung nodule.  5 mm nodule in the right apex.  Mass/conglomerate adenopathy in the left hilum 2.7 x 5.8 x 5.8 cm.  Subcarinal lymph node 1.9 cm.  Left supraclavicular lymph node 1.6 cm.  Left paratracheal lymph node 1.5 cm.  No findings suggestive of metastatic disease in the abdomen and pelvis. - Brain MRI (10/28/2022): Negative for metastatic disease. - PET scan (11/07/2022): Bone mets involving the left femur, T3 transverse process, several left ribs, left scapula at the shoulder joint.  Bilateral adrenal mets. - NGS: PD-L1 22 C3-7%, MS-stable, TMB-low, KEAP1, K-ras G12 D, STK 11 - Ipilimumab and nivolumab cycle 1 on 12/19/2022    2. Prostate cancer: - Robotic assisted laparoscopic prostatectomy on 02/24/2008 at [facility] with extranodal extension, perineural invasion, positive margin.  He received adjuvant XRT/ADT until 08/26/2008. - Last PSA in 2018 undetectable.    3. Mantle cell lymphoma of the  colon: - Colon biopsy (08/27/2013) by Dr. Laural Golden consistent with mantle cell lymphoma. - Bendamustine/rituximab for 6 cycles from 09/14/2013 through 02/07/2014 - Maintenance rituximab from 05/10/2014 through 04/01/2016   Plan: 1.  Metastatic poorly differentiated adenocarcinoma of the lung to the bones and adrenals: - He received first cycle of chemotherapy last week.  Daughter reports that his energy levels improved.  He is also eating better. - He was evaluated in the ER  for leg swelling on Thursday.  I have reviewed CT angiogram from 12/22/2022 which was negative for PE.  Other malignant findings were stable. - Today physical exam shows prominent left supraclavicular lymph node. - Labs today shows hemoglobin 7.8 with MCV 95.  Will order ferritin and iron panel. - His glucose is 428 today.  He will receive insulin 10 units IV.  He was also told to increase his protein intake.   2.  Right shoulder pain: - He reports right shoulder pain has improved.  However he has pain in his knees and left shoulder.  Continue Dilaudid 2 mg every 6 hours as needed.   3.  UTI: - He has an artificial bladder sphincter and penile pump.   4.  Nausea: - Continue Reglan 10 mg every 6 hours as needed which works better than Compazine for him.   5.  Sleeping difficulty: - Continue trazodone at bedtime.  6.  Hypomagnesemia: - Magnesium is 1.6 today.  Will start him on magnesium 400 mg twice daily.  Orders Placed This Encounter  Procedures   Iron and TIBC ([facility] ONLY)  Standing Status:   Future    Number of Occurrences:   1    Standing Expiration Date:   12/23/2023   Ferritin    Standing Status:   Future    Number of Occurrences:   1    Standing Expiration Date:   12/23/2023      I,Katie Daubenspeck,acting as a scribe for Derek Jack, MD.,have documented all relevant documentation on the behalf of Derek Jack, MD,as directed by  Derek Jack, MD while in the  presence of Derek Jack, MD.   I, Derek Jack MD, have reviewed the above documentation for accuracy and completeness, and I agree with the above.   Derek Jack, MD   4/1/20246:28 PM  CHIEF COMPLAINT:   Diagnosis: Metastatic adenocarcinoma of the lung    Cancer Staging  Primary lung adenocarcinoma, left Staging form: Lung, AJCC 8th Edition - Clinical stage from 11/13/2022: Stage IVB (cT1b, cN3, cM1c) - Unsigned    Prior Therapy: none  Current Therapy:  Opdivo and Yervoy    HISTORY OF PRESENT ILLNESS:   Oncology History  Mantle cell lymphoma  08/27/2013 Initial Diagnosis   Colon, biopsy, random - MANTLE CELL LYMPHOMA   09/06/2013 Imaging   CT CAP- No significant lymphadenopathy identified within the chest, abdomen or pelvis.   09/14/2013 - 02/07/2014 Chemotherapy   Bendamustine/Rituxan x 6 cycles with Neulasta support   12/17/2013 Procedure   Colonoscopy with biopsy by Dr. Laural Golden.   12/17/2013 Pathology Results   Colon, biopsy, ascending and transverse - BENIGN APPEARING COLONIC MUCOSA WITH SCATTERED ATYPICAL LYMPHOCYTES.Overall, these findings are suspicious for minimal residual mantle cell lymphoma.   02/28/2014 Procedure   Colonoscopy with biopsy by Dr. Laural Golden   02/28/2014 Pathology Results   Colon, biopsy, proximal and distal - BENIGN COLONIC MUCOSA. - NO SIGNIFICANT INFLAMMATION OR OTHER ABNORMALITIES IDENTIFIED. - NO EVIDENCE OF LYMPHOMA, ADENOMATOUS CHANGES OR MALIGNANCY.   02/28/2014 Remission   No evidence of malignancy on colonosocpy and pathology from biopsy.   05/10/2014 - 04/01/2016 Chemotherapy   Maintenance Rituxan 500 mg/m x 2 years.   05/11/2015 Imaging   No definite findings to suggest metastatic disease in chest, abdomen or pelvis, new ground glass atten in lungs B, nonspecific, repeat 6 to 12 mo   06/30/2015 - 07/02/2015 Hospital Admission   [facility]   08/04/2015 Procedure   Colonoscopy with biopsy by Dr. Laural Golden.   08/04/2015  Pathology Results   Colon, biopsy, right and left. - BENIGN COLORECTAL MUCOSA. - ASSOCIATED BENIGN LYMPHOID AGGREGATES. - NO EVIDENCE OF SIGNIFICANT INFLAMMATION, DYSPLASIA OR MALIGNANCY.   05/10/2016 Imaging   CT CAP- 1. No definite findings to suggest metastatic disease in the chest, abdomen or pelvis. There are several new patchy areas of ground-glass attenuation in the lungs bilaterally which are highly nonspecific. The possibility of developing interstitial lung disease is not excluded, and repeat high-resolution chest CT is suggested in 6-12 months to assess for temporal changes in the appearance of the lung parenchyma.    06/17/2016 Imaging   CT chest high resolution- 1. No convincing findings of interstitial lung disease. Minimal subpleural reticulation and ground-glass attenuation in the dependent lower lobes is not significantly changed since 05/11/2015, and appears slightly less prominent on the inspiratory sequence, suggesting a combination of mild hypoventilatory change and minimal nonspecific scarring. No traction bronchiectasis or frank honeycombing. 2. Two tiny solid pulmonary nodules in the right middle lobe and left lower lobe are stable and probably benign. 3. Left lower lobe ground-glass 11 mm nodule is  stable. Given persistence, repeat CT is recommended every 2 years until 5 years of stability has been established. This recommendation follows the consensus statement: Guidelines for Management of Incidental Pulmonary Nodules Detected on CT Images: From the Fleischner Society 2017; Radiology 2017; 284:228-243. 4. Additional findings include aortic atherosclerosis and coronary atherosclerosis.   08/19/2016 Imaging   CT abd/pelvis- 1. Tiny nonobstructive stones in the right kidney. No cause for left flank pain identified. 2. Diverticulosis without focal diverticulitis. 3. Atherosclerosis. 4. Rounded density over the left lower chest on the scout view only, not seen  on the June 17, 2016 CT is likely something on the patient's such as an EKG lead. Recommend clinical correlation. 5. Cholelithiasis.   Primary lung adenocarcinoma, left  11/13/2022 Initial Diagnosis   Primary lung adenocarcinoma, left ([place])   12/19/2022 -  Chemotherapy   Patient is on Treatment Plan : LUNG NSCLC Nivolumab (3) D1,15,29 + Ipilimumab (1) D1 q42d        INTERVAL HISTORY:   [name] is a 88 y.o. male presenting to clinic today for follow up of metastatic adenocarcinoma of the lung. He was last seen by me on 12/04/22.  He was started on treatment with Opdivo and Yervoy on 12/19/22.  Since his last visit, he was seen in the ED on 12/21/22 for leg swelling and shortness of breath.  Of note, his daughter reached out to [place] via MyChart regarding concerns with a drastic drop in blood sugar the night of/morning after his first treatment.  Today, he states that he is doing well overall. His appetite level is at 100%. His energy level is at 10%.  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, anomalous circumflex - no PCIs   Essential hypertension, benign    GERD (gastroesophageal reflux disease)    Mixed hyperlipidemia    Obstructive sleep apnea    does not use   Prostate cancer (HCC)    PVD (peripheral vascular disease) ([place])    Type 2 diabetes mellitus ([place])     Surgical History: Past Surgical History:  Procedure Laterality Date   BACK SURGERY     BALLOON DILATION N/A 08/27/2013   Procedure: BALLOON DILATION;  Surgeon: Rogene Houston, MD;  [facility];  Service: Endoscopy;  Laterality: N/A;   COLON SURGERY     COLONOSCOPY N/A 12/17/2013   Procedure:  COLONOSCOPY;  Surgeon: Rogene Houston, MD;  [facility];  Service: Endoscopy;  Laterality: N/A;  940   COLONOSCOPY N/A 02/28/2014   Procedure: COLONOSCOPY;  Surgeon: Rogene Houston, MD;  [facility];  Service: Endoscopy;  Laterality: N/A;  730   COLONOSCOPY N/A 08/04/2015   Procedure: COLONOSCOPY;  Surgeon: Rogene Houston, MD;  [facility];  Service: Endoscopy;  Laterality: N/A;  200 - moved to 11/11 @ 2:10 - Ann to notify pt   COLONOSCOPY WITH ESOPHAGOGASTRODUODENOSCOPY (EGD) N/A 08/27/2013   Procedure: COLONOSCOPY WITH ESOPHAGOGASTRODUODENOSCOPY (EGD);  Surgeon: Rogene Houston, MD;  [facility];  Service: Endoscopy;  Laterality: N/A;  730   COLONOSCOPY WITH PROPOFOL N/A 01/02/2022   Procedure: COLONOSCOPY WITH PROPOFOL;  Surgeon: Rogene Houston, MD;  [facility];  Service: Endoscopy;  Laterality: N/A;  1205   CYSTOSCOPY N/A 06/09/2021   Procedure: CYSTOSCOPY;  Surgeon: Lucas Mallow, MD;  Location: [facility];  Service: Urology;  Laterality: N/A;   IR IMAGING GUIDED PORT INSERTION  11/05/2022   IR REMOVAL TUN ACCESS W/ PORT W/O FL MOD SED  07/17/2018   IR US GUIDE BX ASP/DRAIN  11/05/2022   KNEE ARTHROSCOPY Left    KNEE SURGERY Right    total knee   MALONEY DILATION N/A 08/27/2013   Procedure: MALONEY DILATION;  Surgeon: Rogene Houston, MD;  [facility];  Service: Endoscopy;  Laterality: N/A;   POLYPECTOMY  01/02/2022   Procedure: POLYPECTOMY;  Surgeon: Rogene Houston, MD;  [facility];  Service: Endoscopy;;  cecal;ascending;proximal, sigmoid;rectal;   PORTACATH PLACEMENT Right 09/23/2012   PROSTATECTOMY     REMOVAL OF PENILE PROSTHESIS N/A 06/09/2021   Procedure: REMOVAL OF ARTIFICIAL URINARY SPHINCER; REMOVAL OF INFLATABLE PENILE PROSTHESIS;  Surgeon: Lucas Mallow, MD;  Location: [facility];  Service: Urology;  Laterality: N/A;   removal of port Right    SAVORY DILATION N/A 08/27/2013   Procedure:  SAVORY DILATION;  Surgeon: Rogene Houston, MD;  [facility];  Service: Endoscopy;  Laterality: N/A;   SHOULDER SURGERY     TONSILLECTOMY     URINARY SPHINCTER IMPLANT N/A 01/23/2017   artificial urinary sphincter implant by Dr. Odis Luster   VASECTOMY      Social History: Social History   Socioeconomic History   Marital status: Married    Spouse name: [name]   Number of children: 2   Years of education: 9th grade   Highest education level: Not on file  Occupational History   Occupation: Retired     Fish farm manager: RETIRED  Tobacco Use   Smoking status: Never   Smokeless tobacco: Never  Vaping Use   Vaping Use: Never used  Substance and Sexual Activity   Alcohol use: No   Drug use: No   Sexual activity: Yes    Birth control/protection: None  Other Topics Concern   Not on file  Social History Narrative   Lives with wife   Caffeine use: coffee 1 cup some days of the week. Update 02/10/2020 ("very rarely though")   Right-handed   Social Determinants of Health   Financial Resource Strain: Not on file  Food Insecurity: No Food Insecurity (11/30/2022)   Hunger Vital Sign    Worried About Running Out of Food in the Last Year: Never true    Ran Out of Food in the Last Year: Never true  Transportation Needs: No Transportation Needs (11/30/2022)   PRAPARE - Hydrologist (Medical): No    Lack of Transportation (Non-Medical): No  Physical Activity: Not on file  Stress: Not on file  Social Connections: Not on file  Intimate Partner Violence: Not At Risk (11/30/2022)   Humiliation, Afraid, Rape, and Kick questionnaire    Fear of Current or Ex-Partner: No    Emotionally Abused: No    Physically Abused: No    Sexually Abused: No    Family History: Family History  Problem Relation Age of Onset   Colon cancer Brother    Cancer Brother    Diabetes Father    Cancer Brother     Current Medications:  Current Outpatient Medications:     acetaminophen (TYLENOL) 325 MG tablet, Take 650 mg by mouth every 6 (  six) hours as needed for mild pain or moderate pain., Disp: , Rfl:    albuterol (PROAIR HFA) 108 (90 Base) MCG/ACT inhaler, Inhale 2 puffs into the lungs at bedtime as needed for wheezing or shortness of breath., Disp: 18 g, Rfl: 1   amLODipine (NORVASC) 10 MG tablet, Take 1 tablet (10 mg total) by mouth daily. (Patient taking differently: Take 5 mg by mouth daily.), Disp: 90 tablet, Rfl: 3   aspirin EC 81 MG tablet, Take 81 mg by mouth daily., Disp: , Rfl:    atorvastatin (LIPITOR) 40 MG tablet, Take 40 mg by mouth daily., Disp: , Rfl:    blood glucose meter kit and supplies KIT, Dispense based on patient and insurance preference. Use at least three times a day to check BS levels and fluctuation., Disp: 1 each, Rfl: 0   budesonide-formoterol (SYMBICORT) 160-4.5 MCG/ACT inhaler, Inhale 2 puffs into the lungs 2 (two) times daily., Disp: 1 each, Rfl: 11   dimenhyDRINATE (DRAMAMINE) 50 MG tablet, Take 50 mg by mouth every 8 (eight) hours as needed for nausea., Disp: , Rfl:    folic acid (FOLVITE) 1 MG tablet, Take 1 tablet (1 mg total) by mouth daily., Disp: 90 tablet, Rfl: 3   furosemide (LASIX) 20 MG tablet, Take 1 tablet (20 mg total) by mouth daily as needed for edema., Disp: 10 tablet, Rfl: 0   HYDROmorphone (DILAUDID) 2 MG tablet, Take 1 tablet (2 mg total) by mouth every 6 (six) hours as needed., Disp: 90 tablet, Rfl: 0   insulin lispro (HUMALOG KWIKPEN) 100 UNIT/ML KwikPen, Inject 10 Units into the skin 3 (three) times daily. 4-8 units, Disp: 15 mL, Rfl: 3   Ipilimumab (YERVOY IV), Inject into the vein., Disp: , Rfl:    magnesium oxide (MAG-OX) 400 (240 Mg) MG tablet, Take 1 tablet (400 mg total) by mouth daily., Disp: 60 tablet, Rfl: 3   meclizine (ANTIVERT) 25 MG tablet, Take 1 tablet (25 mg total) by mouth 3 (three) times daily as needed for dizziness., Disp: 30 tablet, Rfl: 0   megestrol (MEGACE) 400 MG/10ML suspension, Take  10 mLs (400 mg total) by mouth 2 (two) times daily., Disp: 480 mL, Rfl: 0   metoCLOPramide (REGLAN) 10 MG tablet, Take 1 tablet (10 mg total) by mouth every 6 (six) hours as needed for nausea., Disp: 120 tablet, Rfl: 3   Nivolumab (OPDIVO IV), Inject into the vein., Disp: , Rfl:    nystatin (MYCOSTATIN/NYSTOP) powder, Apply topically., Disp: , Rfl:    pantoprazole (PROTONIX) 40 MG tablet, Take 40 mg by mouth 2 (two) times daily., Disp: , Rfl:    testosterone cypionate (DEPOTESTOSTERONE CYPIONATE) 200 MG/ML injection, Inject 200 mg into the muscle every 14 (fourteen) days. , Disp: , Rfl:    TOUJEO SOLOSTAR 300 UNIT/ML Solostar Pen, Inject 40 Units into the skin daily., Disp: 1.5 mL, Rfl: 3   traZODone (DESYREL) 50 MG tablet, Take 1 tablet (50 mg total) by mouth at bedtime as needed for sleep., Disp: 20 tablet, Rfl: 0   lidocaine-prilocaine (EMLA) cream, Apply to affected area once (Patient not taking: Reported on 12/23/2022), Disp: 30 g, Rfl: 3 No current facility-administered medications for this visit.  Facility-Administered Medications Ordered in Other Visits:    heparin lock flush 100 unit/mL, 500 Units, Intravenous, Once, Kefalas, Thomas S, PA-C   sodium chloride flush (NS) 0.9 % injection 20 mL, 20 mL, Intravenous, PRN, Sheldon Silvan, Manon Hilding, PA-C   Allergies: Allergies  Allergen Reactions   Bee  Venom Anaphylaxis   Iodine     Pt does not remember reaction  Other Reaction(s): GI Intolerance, Hypotension, Other (See Comments)  syncope   Adhesive [Tape] Hives   Ciprofloxacin     Pt does not remember reaction    Povidone-Iodine     Pt does not remember reaction    Simvastatin     Pt does not remember reaction    Codeine     Pt does not remember reaction  Other Reaction(s): GI Intolerance   Latex Rash    REVIEW OF SYSTEMS:   Review of Systems  Constitutional:  Negative for chills, fatigue and fever.  HENT:   Negative for lump/mass, mouth sores, nosebleeds, sore throat and trouble  swallowing.   Eyes:  Negative for eye problems.  Respiratory:  Positive for shortness of breath. Negative for cough.   Cardiovascular:  Negative for chest pain, leg swelling and palpitations.  Gastrointestinal:  Positive for constipation. Negative for abdominal pain, diarrhea, nausea and vomiting.  Genitourinary:  Negative for bladder incontinence, difficulty urinating, dysuria, frequency, hematuria and nocturia.   Musculoskeletal:  Negative for arthralgias, back pain, flank pain, myalgias and neck pain.  Skin:  Negative for itching and rash.  Neurological:  Positive for dizziness and headaches. Negative for numbness.  Hematological:  Does not bruise/bleed easily.  Psychiatric/Behavioral:  Positive for sleep disturbance. Negative for depression and suicidal ideas. The patient is not nervous/anxious.   All other systems reviewed and are negative.    VITALS:   There were no vitals taken for this visit.  Wt Readings from Last 3 Encounters:  12/21/22 233 lb 0.4 oz (105.7 kg)  12/19/22 233 lb (105.7 kg)  11/30/22 201 lb 12.8 oz (91.5 kg)    There is no height or weight on file to calculate BMI.  Performance status (ECOG): 1 - Symptomatic but completely ambulatory  PHYSICAL EXAM:   Physical Exam Vitals and nursing note reviewed. Exam conducted with a chaperone present.  Constitutional:      Appearance: Normal appearance.  Cardiovascular:     Rate and Rhythm: Normal rate and regular rhythm.     Pulses: Normal pulses.     Heart sounds: Normal heart sounds.  Pulmonary:     Effort: Pulmonary effort is normal.     Breath sounds: Normal breath sounds.  Abdominal:     Palpations: Abdomen is soft. There is no hepatomegaly, splenomegaly or mass.     Tenderness: There is no abdominal tenderness.  Musculoskeletal:     Right lower leg: Edema present.     Left lower leg: Edema present.  Lymphadenopathy:     Cervical: No cervical adenopathy.     Right cervical: No superficial, deep or  posterior cervical adenopathy.    Left cervical: No superficial, deep or posterior cervical adenopathy.     Upper Body:     Right upper body: No supraclavicular or axillary adenopathy.     Left upper body: No supraclavicular or axillary adenopathy.  Neurological:     General: No focal deficit present.     Mental Status: He is alert and oriented to person, place, and time.  Psychiatric:        Mood and Affect: Mood normal.        Behavior: Behavior normal.     LABS:      Latest Ref Rng & Units 12/23/2022    8:16 AM 12/21/2022   10:08 PM 12/18/2022    1:16 PM  CBC  WBC 4.0 -  10.5 K/uL 4.1  4.2  5.6   Hemoglobin 13.0 - 17.0 g/dL 7.8  8.3  8.3   Hematocrit 39.0 - 52.0 % 25.3  27.3  27.4   Platelets 150 - 400 K/uL 131  135  132       Latest Ref Rng & Units 12/23/2022    8:16 AM 12/21/2022   10:08 PM 12/18/2022    1:16 PM  CMP  Glucose 70 - 99 mg/dL 428  224  141   BUN 8 - 23 mg/dL 42  39  37   Creatinine 0.61 - 1.24 mg/dL 1.39  1.32  1.34   Sodium 135 - 145 mmol/L 131  134  139   Potassium 3.5 - 5.1 mmol/L 4.3  4.3  4.0   Chloride 98 - 111 mmol/L 96  100  102   CO2 22 - 32 mmol/L 22  24  26    Calcium 8.9 - 10.3 mg/dL 8.2  8.6  8.4   Total Protein 6.5 - 8.1 g/dL 6.2  6.4  6.3   Total Bilirubin 0.3 - 1.2 mg/dL 0.6  0.7  0.7   Alkaline Phos 38 - 126 U/L 67  69  69   AST 15 - 41 U/L 13  17  15    ALT 0 - 44 U/L 17  18  16       No results found for: "CEA1", "CEA" / No results found for: "CEA1", "CEA" No results found for: "PSA1" No results found for: "[id number]" No results found for: "CAN125"  No results found for: "TOTALPROTELP", "ALBUMINELP", "A1GS", "A2GS", "BETS", "BETA2SER", "GAMS", "MSPIKE", "SPEI" Lab Results  Component Value Date   TIBC 254 12/23/2022   TIBC 278 05/24/2014   FERRITIN 377 (H) 12/23/2022   FERRITIN 163 05/24/2014   IRONPCTSAT 15 (L) 12/23/2022   IRONPCTSAT 30 05/24/2014   Lab Results  Component Value Date   LDH 131 02/23/2018   LDH 131 09/25/2017    LDH 136 07/14/2017     STUDIES:   CT Angio Chest PE W and/or Wo Contrast  Result Date: 12/22/2022 CLINICAL DATA:  Concern for pulmonary embolism. Non-small cell lung cancer. EXAM: CT ANGIOGRAPHY CHEST WITH CONTRAST TECHNIQUE: Multidetector CT imaging of the chest was performed using the standard protocol during bolus administration of intravenous contrast. Multiplanar CT image reconstructions and MIPs were obtained to evaluate the vascular anatomy. RADIATION DOSE REDUCTION: This exam was performed according to the departmental dose-optimization program which includes automated exposure control, adjustment of the mA and/or kV according to patient size and/or use of iterative reconstruction technique. CONTRAST:  21mL OMNIPAQUE IOHEXOL 350 MG/ML SOLN COMPARISON:  CT dated 10/26/2022. FINDINGS: Evaluation of this exam is limited due to respiratory motion artifact. Cardiovascular: There is no cardiomegaly or pericardial effusion. There is moderate atherosclerotic calcification of the thoracic aorta. No aneurysmal dilatation or dissection. The origins of the great vessels of the aortic arch appear patent. Right-sided Port-A-Cath with tip in the central SVC close to the cavoatrial junction. Evaluation of the pulmonary arteries is limited due to respiratory motion. No large or central pulmonary artery embolus identified. Mediastinum/Nodes: Mildly enlarged left hilar and subcarinal lymph nodes. The subcarinal lymph node measures approximately 17 mm in short axis. The esophagus is grossly unremarkable. No mediastinal fluid collection. Lungs/Pleura: No significant interval change in the somewhat spiculated nodule in the superior segment of the left lower lobe. No focal consolidation, pleural effusion, or pneumothorax. The central airways are patent. Upper Abdomen: Small gallstone. Bilateral adrenal lesions,  new since the prior CT and measuring up to 3 cm on the right most consistent with metastatic disease. Partially  visualized left renal cysts. Musculoskeletal: Osteopenia with degenerative changes of the spine. Pathologic fracture of the right acromion, new since the prior CT. Review of the MIP images confirms the above findings. IMPRESSION: 1. No CT evidence of central pulmonary artery embolus. 2. No significant interval change in the somewhat spiculated nodule in the superior segment of the left lower lobe. 3. Mildly enlarged left hilar and subcarinal lymph nodes. 4. Bilateral adrenal lesions, new since the prior CT and most consistent with metastatic disease. 5. Pathologic fracture of the right acromion, new since the prior CT. 6.  Aortic Atherosclerosis (ICD10-I70.0). Electronically Signed   By: Anner Crete M.D.   On: 12/22/2022 00:31   DG Chest 2 View  Result Date: 12/21/2022 CLINICAL DATA:  Short of breath, history of non-small cell lung cancer EXAM: CHEST - 2 VIEW COMPARISON:  11/07/2022, 11/30/2022 FINDINGS: Frontal and lateral views of the chest demonstrate a stable right chest wall port. Increased density at the left hilum consistent with the metabolically active left hilar adenopathy seen on prior PET scan. The metabolically active left lower lobe nodule seen on recent PET scan is difficult to appreciate by x-ray. No new consolidation, effusion, or pneumothorax. No acute bony abnormalities. IMPRESSION: 1. Left hilar prominence consistent with metabolically active adenopathy seen on recent PET scan. 2. The hypermetabolic left lower lobe pulmonary nodule on recent PET scan is not well visualized by x-ray. 3. No acute airspace disease. Electronically Signed   By: Randa Ngo M.D.   On: 12/21/2022 22:02   DG Chest Port 1 View  Result Date: 11/30/2022 CLINICAL DATA:  Possible sepsis. EXAM: PORTABLE CHEST 1 VIEW COMPARISON:  11/12/2022 FINDINGS: RIGHT power port tip overlies the superior vena cava. Low lung volume. Heart size is normal. There is patchy opacity in the LEFT mid lung zone and LEFT lung base.  LEFT hilar mass is better seen on prior studies. RIGHT lung is clear. IMPRESSION: 1. Low lung volume. 2. LEFT mid lung zone and LEFT LOWER lobe atelectasis or minimal infiltrate. Electronically Signed   By: Nolon Nations M.D.   On: 11/30/2022 08:46

## 2022-12-22 NOTE — ED Notes (Signed)
Called daughter to advise patient is being discharged. She is on her way.

## 2022-12-22 NOTE — ED Provider Notes (Signed)
[facility] Provider Note   CSN: [id number] Arrival date & time: 12/21/22  2119     History  Chief Complaint  Patient presents with   Shortness of Breath   Leg Swelling    [name] is a 88 y.o. male.  63-year-old with a history of lung cancer presents the ER today secondary to lower extremity swelling and shortness of breath.  Has progressed over last couple days and worsened tonight.  No fevers or cough.  No history of the same.  Received his first infusion a few days ago.   Shortness of Breath      Home Medications Prior to Admission medications   Medication Sig Start Date End Date Taking? Authorizing Provider  acetaminophen (TYLENOL) 325 MG tablet Take 650 mg by mouth every 6 (six) hours as needed for mild pain or moderate pain.   Yes [provider]  amLODipine (NORVASC) 10 MG tablet Take 1 tablet (10 mg total) by mouth daily. Patient taking differently: Take 5 mg by mouth daily. 01/11/22  Yes Richardson Dopp T, PA-C  aspirin EC 81 MG tablet Take 81 mg by mouth daily. 04/29/22  Yes [provider]  blood glucose meter kit and supplies KIT Dispense based on patient and insurance preference. Use at least three times a day to check BS levels and fluctuation. 09/04/21  Yes Barton Dubois, MD  dimenhyDRINATE (DRAMAMINE) 50 MG tablet Take 50 mg by mouth every 8 (eight) hours as needed for nausea.   Yes [provider]  folic acid (FOLVITE) 1 MG tablet Take 1 tablet (1 mg total) by mouth daily. 11/13/22  Yes Derek Jack, MD  furosemide (LASIX) 20 MG tablet Take 1 tablet (20 mg total) by mouth daily as needed for edema. 12/22/22  Yes Rajeev Escue, Corene Cornea, MD  HYDROmorphone (DILAUDID) 2 MG tablet Take 1 tablet (2 mg total) by mouth every 6 (six) hours as needed. 12/18/22  Yes Derek Jack, MD  insulin lispro (HUMALOG KWIKPEN) 100 UNIT/ML KwikPen Inject 10 Units into the skin 3 (three) times daily. 4-8  units 12/03/22  Yes Shalhoub, Sherryll Burger, MD  Ipilimumab (YERVOY IV) Inject into the vein. 12/19/22  Yes [provider]  lidocaine-prilocaine (EMLA) cream Apply to affected area once 12/17/22  Yes Derek Jack, MD  meclizine (ANTIVERT) 25 MG tablet Take 1 tablet (25 mg total) by mouth 3 (three) times daily as needed for dizziness. 10/15/21  Yes Kathie Dike, MD  megestrol (MEGACE) 400 MG/10ML suspension Take 10 mLs (400 mg total) by mouth 2 (two) times daily. 11/25/22  Yes Derek Jack, MD  metoCLOPramide (REGLAN) 10 MG tablet Take 1 tablet (10 mg total) by mouth every 6 (six) hours as needed for nausea. 11/25/22  Yes Derek Jack, MD  Nivolumab (OPDIVO IV) Inject into the vein. 12/20/22  Yes [provider]  TOUJEO SOLOSTAR 300 UNIT/ML Solostar Pen Inject 40 Units into the skin daily. 12/03/22  Yes Shalhoub, Sherryll Burger, MD  traZODone (DESYREL) 50 MG tablet Take 1 tablet (50 mg total) by mouth at bedtime as needed for sleep. 12/03/22  Yes Shalhoub, Sherryll Burger, MD  albuterol (PROAIR HFA) 108 (90 Base) MCG/ACT inhaler Inhale 2 puffs into the lungs at bedtime as needed for wheezing or shortness of breath. 05/12/20   Tanda Rockers, MD  atorvastatin (LIPITOR) 40 MG tablet Take 40 mg by mouth daily. 10/05/21   [provider]  budesonide-formoterol (SYMBICORT) 160-4.5 MCG/ACT inhaler Inhale 2 puffs into  the lungs 2 (two) times daily. 05/12/20   Tanda Rockers, MD  nystatin (MYCOSTATIN/NYSTOP) powder Apply topically. 09/24/22   [provider]  pantoprazole (PROTONIX) 40 MG tablet Take 40 mg by mouth 2 (two) times daily. 11/08/22   [provider]  testosterone cypionate (DEPOTESTOSTERONE CYPIONATE) 200 MG/ML injection Inject 200 mg into the muscle every 14 (fourteen) days.  04/29/16   [provider]      Allergies    Bee venom, Iodine, Adhesive [tape], Ciprofloxacin, Povidone-iodine, Simvastatin, Codeine, and Latex    Review of Systems    Review of Systems  Respiratory:  Positive for shortness of breath.     Physical Exam Updated Vital Signs BP (!) 112/57   Pulse 87   Temp 99.1 F (37.3 C) (Oral)   Resp 18   Ht 5\' 11"  (1.803 m)   Wt 105.7 kg   SpO2 93%   BMI 32.50 kg/m  Physical Exam Vitals and nursing note reviewed.  Constitutional:      Appearance: He is well-developed.  HENT:     Head: Normocephalic and atraumatic.  Cardiovascular:     Rate and Rhythm: Tachycardia present.  Pulmonary:     Effort: Pulmonary effort is normal. Tachypnea present. No respiratory distress.     Breath sounds: Examination of the right-lower field reveals rales. Examination of the left-lower field reveals rales. Decreased breath sounds, wheezing and rales present.  Abdominal:     General: There is no distension.  Musculoskeletal:        General: Normal range of motion.     Cervical back: Normal range of motion.     Right lower leg: No tenderness. Edema present.     Left lower leg: Tenderness present. Edema present.  Skin:    General: Skin is warm and dry.  Neurological:     General: No focal deficit present.     Mental Status: He is alert.     ED Results / Procedures / Treatments   Labs (all labs ordered are listed, but only abnormal results are displayed) Labs Reviewed  COMPREHENSIVE METABOLIC PANEL - Abnormal; Notable for the following components:      Result Value   Sodium 134 (*)    Glucose, Bld 224 (*)    BUN 39 (*)    Creatinine, Ser 1.32 (*)    Calcium 8.6 (*)    Total Protein 6.4 (*)    Albumin 3.2 (*)    GFR, Estimated 53 (*)    All other components within normal limits  CBC WITH DIFFERENTIAL/PLATELET - Abnormal; Notable for the following components:   RBC 2.79 (*)    Hemoglobin 8.3 (*)    HCT 27.3 (*)    RDW 19.1 (*)    Platelets 135 (*)    Lymphs Abs 0.6 (*)    All other components within normal limits  URINALYSIS, ROUTINE W REFLEX MICROSCOPIC - Abnormal; Notable for the following components:    Glucose, UA 50 (*)    Leukocytes,Ua TRACE (*)    All other components within normal limits  BRAIN NATRIURETIC PEPTIDE - Abnormal; Notable for the following components:   B Natriuretic Peptide 202.0 (*)    All other components within normal limits  CBG MONITORING, ED - Abnormal; Notable for the following components:   Glucose-Capillary 69 (*)    All other components within normal limits  CBG MONITORING, ED - Abnormal; Notable for the following components:   Glucose-Capillary 123 (*)    All other components  within normal limits  LACTIC ACID, PLASMA  TROPONIN I (HIGH SENSITIVITY)  TROPONIN I (HIGH SENSITIVITY)    EKG EKG Interpretation  Date/Time:  Saturday December 21 2022 21:38:23 EDT Ventricular Rate:  120 PR Interval:  168 QRS Duration: 78 QT Interval:  308 QTC Calculation: 435 R Axis:   -28 Text Interpretation: Sinus tachycardia with Premature atrial complexes Otherwise normal ECG When compared with ECG of 01-Dec-2022 14:02, Premature atrial complexes are now Present Vent. rate has increased BY  45 BPM Confirmed by Merrily Pew [phone]) on 12/21/2022 11:10:21 PM  Radiology CT Angio Chest PE W and/or Wo Contrast  Result Date: 12/22/2022 CLINICAL DATA:  Concern for pulmonary embolism. Non-small cell lung cancer. EXAM: CT ANGIOGRAPHY CHEST WITH CONTRAST TECHNIQUE: Multidetector CT imaging of the chest was performed using the standard protocol during bolus administration of intravenous contrast. Multiplanar CT image reconstructions and MIPs were obtained to evaluate the vascular anatomy. RADIATION DOSE REDUCTION: This exam was performed according to the departmental dose-optimization program which includes automated exposure control, adjustment of the mA and/or kV according to patient size and/or use of iterative reconstruction technique. CONTRAST:  61mL OMNIPAQUE IOHEXOL 350 MG/ML SOLN COMPARISON:  CT dated 10/26/2022. FINDINGS: Evaluation of this exam is limited due to respiratory motion  artifact. Cardiovascular: There is no cardiomegaly or pericardial effusion. There is moderate atherosclerotic calcification of the thoracic aorta. No aneurysmal dilatation or dissection. The origins of the great vessels of the aortic arch appear patent. Right-sided Port-A-Cath with tip in the central SVC close to the cavoatrial junction. Evaluation of the pulmonary arteries is limited due to respiratory motion. No large or central pulmonary artery embolus identified. Mediastinum/Nodes: Mildly enlarged left hilar and subcarinal lymph nodes. The subcarinal lymph node measures approximately 17 mm in short axis. The esophagus is grossly unremarkable. No mediastinal fluid collection. Lungs/Pleura: No significant interval change in the somewhat spiculated nodule in the superior segment of the left lower lobe. No focal consolidation, pleural effusion, or pneumothorax. The central airways are patent. Upper Abdomen: Small gallstone. Bilateral adrenal lesions, new since the prior CT and measuring up to 3 cm on the right most consistent with metastatic disease. Partially visualized left renal cysts. Musculoskeletal: Osteopenia with degenerative changes of the spine. Pathologic fracture of the right acromion, new since the prior CT. Review of the MIP images confirms the above findings. IMPRESSION: 1. No CT evidence of central pulmonary artery embolus. 2. No significant interval change in the somewhat spiculated nodule in the superior segment of the left lower lobe. 3. Mildly enlarged left hilar and subcarinal lymph nodes. 4. Bilateral adrenal lesions, new since the prior CT and most consistent with metastatic disease. 5. Pathologic fracture of the right acromion, new since the prior CT. 6.  Aortic Atherosclerosis (ICD10-I70.0). Electronically Signed   By: Anner Crete M.D.   On: 12/22/2022 00:31   DG Chest 2 View  Result Date: 12/21/2022 CLINICAL DATA:  Short of breath, history of non-small cell lung cancer EXAM: CHEST  - 2 VIEW COMPARISON:  11/07/2022, 11/30/2022 FINDINGS: Frontal and lateral views of the chest demonstrate a stable right chest wall port. Increased density at the left hilum consistent with the metabolically active left hilar adenopathy seen on prior PET scan. The metabolically active left lower lobe nodule seen on recent PET scan is difficult to appreciate by x-ray. No new consolidation, effusion, or pneumothorax. No acute bony abnormalities. IMPRESSION: 1. Left hilar prominence consistent with metabolically active adenopathy seen on recent PET scan. 2. The hypermetabolic  left lower lobe pulmonary nodule on recent PET scan is not well visualized by x-ray. 3. No acute airspace disease. Electronically Signed   By: Randa Ngo M.D.   On: 12/21/2022 22:02    Procedures Procedures    Medications Ordered in ED Medications  lidocaine-prilocaine (EMLA) cream (1 Application Topical Given 12/21/22 2241)  iohexol (OMNIPAQUE) 350 MG/ML injection 75 mL (75 mLs Intravenous Contrast Given 12/21/22 2358)  nystatin (MYCOSTATIN/NYSTOP) topical powder ( Topical Given 12/22/22 0021)  furosemide (LASIX) injection 20 mg (20 mg Intravenous Given 12/22/22 0135)  propranolol (INDERAL) tablet 20 mg (20 mg Oral Given 12/22/22 0341)  ipratropium-albuterol (DUONEB) 0.5-2.5 (3) MG/3ML nebulizer solution 3 mL (3 mLs Nebulization Given 12/22/22 0531)    ED Course/ Medical Decision Making/ A&P                             Medical Decision Making Amount and/or Complexity of Data Reviewed Labs: ordered. Radiology: ordered.  Risk Prescription drug management.   CT scan done secondary to his shortness of breath, tachycardia and his swelling.  He was negative for any PE as visualized by myself.  Redemonstrated his multiple cancerous lesions.  Possible new adrenal gland lesions family is aware.  He was diuresed with good output.  Heart rate improved with a dose of propranolol which she is on at home.  No indication for admission  or further workup at this time.  Will follow-up with oncologist to PCP this coming week.  Will take Lasix as needed until then.  Return here for any new or worsening symptoms.  Final Clinical Impression(s) / ED Diagnoses Final diagnoses:  Leg edema    Rx / DC Orders ED Discharge Orders          Ordered    furosemide (LASIX) 20 MG tablet  Daily PRN        12/22/22 0610              Akshith Moncus, Corene Cornea, MD 12/22/22 [phone]

## 2022-12-22 NOTE — ED Notes (Signed)
Patient ambulated around the room using is his cane. Patient states he feels better, however wheezing was noticed when ambulating back to the bed. MD is aware Medication given .

## 2022-12-22 NOTE — ED Notes (Signed)
Daughter, Vaughan Basta went home. She will come back to pick up the patient when he is ready. Patient is resting in bed.

## 2022-12-22 NOTE — ED Notes (Signed)
Patient ws given some applesauce to eat and ginger-ale to drink. Patient is resting in bed.

## 2022-12-23 ENCOUNTER — Inpatient Hospital Stay (HOSPITAL_BASED_OUTPATIENT_CLINIC_OR_DEPARTMENT_OTHER): Payer: Medicare Other | Admitting: Hematology

## 2022-12-23 ENCOUNTER — Inpatient Hospital Stay: Payer: Medicare Other | Attending: Hematology

## 2022-12-23 ENCOUNTER — Inpatient Hospital Stay: Payer: Medicare Other

## 2022-12-23 ENCOUNTER — Other Ambulatory Visit: Payer: Self-pay

## 2022-12-23 ENCOUNTER — Other Ambulatory Visit: Payer: Self-pay | Admitting: *Deleted

## 2022-12-23 VITALS — BP 126/77 | HR 114 | Temp 99.2°F | Resp 20

## 2022-12-23 DIAGNOSIS — C7972 Secondary malignant neoplasm of left adrenal gland: Secondary | ICD-10-CM | POA: Insufficient documentation

## 2022-12-23 DIAGNOSIS — Z95828 Presence of other vascular implants and grafts: Secondary | ICD-10-CM

## 2022-12-23 DIAGNOSIS — Z79899 Other long term (current) drug therapy: Secondary | ICD-10-CM | POA: Diagnosis not present

## 2022-12-23 DIAGNOSIS — D508 Other iron deficiency anemias: Secondary | ICD-10-CM | POA: Diagnosis not present

## 2022-12-23 DIAGNOSIS — R739 Hyperglycemia, unspecified: Secondary | ICD-10-CM

## 2022-12-23 DIAGNOSIS — C3432 Malignant neoplasm of lower lobe, left bronchus or lung: Secondary | ICD-10-CM | POA: Insufficient documentation

## 2022-12-23 DIAGNOSIS — C7951 Secondary malignant neoplasm of bone: Secondary | ICD-10-CM | POA: Diagnosis not present

## 2022-12-23 DIAGNOSIS — C7971 Secondary malignant neoplasm of right adrenal gland: Secondary | ICD-10-CM | POA: Insufficient documentation

## 2022-12-23 DIAGNOSIS — Z803 Family history of malignant neoplasm of breast: Secondary | ICD-10-CM | POA: Diagnosis not present

## 2022-12-23 DIAGNOSIS — C3492 Malignant neoplasm of unspecified part of left bronchus or lung: Secondary | ICD-10-CM

## 2022-12-23 DIAGNOSIS — Z801 Family history of malignant neoplasm of trachea, bronchus and lung: Secondary | ICD-10-CM | POA: Diagnosis not present

## 2022-12-23 DIAGNOSIS — Z87891 Personal history of nicotine dependence: Secondary | ICD-10-CM | POA: Insufficient documentation

## 2022-12-23 LAB — CBC WITH DIFFERENTIAL/PLATELET
Abs Immature Granulocytes: 0.06 10*3/uL (ref 0.00–0.07)
Basophils Absolute: 0 10*3/uL (ref 0.0–0.1)
Basophils Relative: 1 %
Eosinophils Absolute: 0.1 10*3/uL (ref 0.0–0.5)
Eosinophils Relative: 2 %
HCT: 25.3 % — ABNORMAL LOW (ref 39.0–52.0)
Hemoglobin: 7.8 g/dL — ABNORMAL LOW (ref 13.0–17.0)
Immature Granulocytes: 2 %
Lymphocytes Relative: 13 %
Lymphs Abs: 0.5 10*3/uL — ABNORMAL LOW (ref 0.7–4.0)
MCH: 29.3 pg (ref 26.0–34.0)
MCHC: 30.8 g/dL (ref 30.0–36.0)
MCV: 95.1 fL (ref 80.0–100.0)
Monocytes Absolute: 0.4 10*3/uL (ref 0.1–1.0)
Monocytes Relative: 9 %
Neutro Abs: 3.1 10*3/uL (ref 1.7–7.7)
Neutrophils Relative %: 73 %
Platelets: 131 10*3/uL — ABNORMAL LOW (ref 150–400)
RBC: 2.66 MIL/uL — ABNORMAL LOW (ref 4.22–5.81)
RDW: 19 % — ABNORMAL HIGH (ref 11.5–15.5)
WBC: 4.1 10*3/uL (ref 4.0–10.5)
nRBC: 0 % (ref 0.0–0.2)

## 2022-12-23 LAB — FERRITIN: Ferritin: 377 ng/mL — ABNORMAL HIGH (ref 24–336)

## 2022-12-23 LAB — MAGNESIUM: Magnesium: 1.6 mg/dL — ABNORMAL LOW (ref 1.7–2.4)

## 2022-12-23 LAB — COMPREHENSIVE METABOLIC PANEL
ALT: 17 U/L (ref 0–44)
AST: 13 U/L — ABNORMAL LOW (ref 15–41)
Albumin: 2.8 g/dL — ABNORMAL LOW (ref 3.5–5.0)
Alkaline Phosphatase: 67 U/L (ref 38–126)
Anion gap: 13 (ref 5–15)
BUN: 42 mg/dL — ABNORMAL HIGH (ref 8–23)
CO2: 22 mmol/L (ref 22–32)
Calcium: 8.2 mg/dL — ABNORMAL LOW (ref 8.9–10.3)
Chloride: 96 mmol/L — ABNORMAL LOW (ref 98–111)
Creatinine, Ser: 1.39 mg/dL — ABNORMAL HIGH (ref 0.61–1.24)
GFR, Estimated: 50 mL/min — ABNORMAL LOW (ref >60)
Glucose, Bld: 428 mg/dL — ABNORMAL HIGH (ref 70–99)
Potassium: 4.3 mmol/L (ref 3.5–5.1)
Sodium: 131 mmol/L — ABNORMAL LOW (ref 135–145)
Total Bilirubin: 0.6 mg/dL (ref 0.3–1.2)
Total Protein: 6.2 g/dL — ABNORMAL LOW (ref 6.5–8.1)

## 2022-12-23 LAB — IRON AND TIBC
Iron: 38 ug/dL — ABNORMAL LOW (ref 45–182)
Saturation Ratios: 15 % — ABNORMAL LOW (ref 17.9–39.5)
TIBC: 254 ug/dL (ref 250–450)
UIBC: 216 ug/dL

## 2022-12-23 MED ORDER — SODIUM CHLORIDE 0.9% FLUSH
10.0000 mL | Freq: Once | INTRAVENOUS | Status: AC
  Administered 2022-12-23: 10 mL via INTRAVENOUS

## 2022-12-23 MED ORDER — HEPARIN SOD (PORK) LOCK FLUSH 100 UNIT/ML IV SOLN
500.0000 [IU] | Freq: Once | INTRAVENOUS | Status: AC
  Administered 2022-12-23: 500 [IU] via INTRAVENOUS

## 2022-12-23 MED ORDER — INSULIN ASPART 100 UNIT/ML IJ SOLN
10.0000 [IU] | Freq: Once | INTRAMUSCULAR | Status: AC
  Administered 2022-12-23: 10 [IU] via SUBCUTANEOUS
  Filled 2022-12-23: qty 0.1

## 2022-12-23 MED ORDER — SODIUM CHLORIDE 0.9% FLUSH
10.0000 mL | INTRAVENOUS | Status: AC
  Administered 2022-12-23: 10 mL

## 2022-12-23 MED ORDER — MAGNESIUM OXIDE -MG SUPPLEMENT 400 (240 MG) MG PO TABS: 400.0000 mg | ORAL_TABLET | Freq: Every day | ORAL | 3 refills | Status: DC

## 2022-12-23 NOTE — Patient Instructions (Addendum)
[facility] at [facility] Discharge Instructions   You were seen and examined today by Dr. Delton Coombes.  He reviewed the results of your lab work which are mostly normal/stable. Your blood glucose is 428 today. Your magnesium is also slightly low at 1.6. Take your insulin as prescribed. We will send a prescription for magnesium to your pharmacy. Take as prescribed.   Return as scheduled.    Thank you for choosing [place] at [facility] to provide your oncology and hematology care.  To afford each patient quality time with our provider, please arrive at least 15 minutes before your scheduled appointment time.   If you have a lab appointment with the [facility] please come in thru the Main Entrance and check in at the main information desk.  You need to re-schedule your appointment should you arrive 10 or more minutes late.  We strive to give you quality time with our providers, and arriving late affects you and other patients whose appointments are after yours.  Also, if you no show three or more times for appointments you may be dismissed from the clinic at the providers discretion.     Again, thank you for choosing [facility].  Our hope is that these requests will decrease the amount of time that you wait before being seen by our physicians.       _____________________________________________________________  Should you have questions after your visit to [facility], please contact our office at [phone] and follow the prompts.  Our office hours are 8:00 a.m. and 4:30 p.m. Monday - Friday.  Please note that voicemails left after 4:00 p.m. may not be returned until the following business day.  We are closed weekends and major holidays.  You do have access to a nurse 24-7, just call the main number to the clinic [phone] and do not press any options, hold on the line and a nurse will answer the phone.    For  prescription refill requests, have your pharmacy contact our office and allow 72 hours.    Due to Covid, you will need to wear a mask upon entering the hospital. If you do not have a mask, a mask will be given to you at the Main Entrance upon arrival. For doctor visits, patients may have 1 support person age 79 or older with them. For treatment visits, patients can not have anyone with them due to social distancing guidelines and our immunocompromised population.

## 2022-12-23 NOTE — Progress Notes (Signed)
[name] presents today for follow up visit and possible fluids. Blood sugar 428. Novolog 10 units given  in the left arm.  Administration without incident; injection site WNL; see MAR for injection details.  Patient tolerated procedure well and without incident.  No questions or complaints noted at this time.

## 2022-12-23 NOTE — Progress Notes (Signed)
Per Dr. Delton Coombes patient does not need fluids today but will receive 10 Units of Novolog for a blood sugar of 428.

## 2022-12-24 ENCOUNTER — Encounter (HOSPITAL_COMMUNITY): Payer: Self-pay | Admitting: Hematology

## 2022-12-24 ENCOUNTER — Other Ambulatory Visit: Payer: Self-pay

## 2022-12-24 NOTE — Addendum Note (Signed)
Addended by: Derek Jack on: 12/24/2022 08:31 AM   Modules accepted: Orders

## 2022-12-26 ENCOUNTER — Other Ambulatory Visit: Payer: Self-pay

## 2022-12-26 ENCOUNTER — Telehealth: Payer: Self-pay | Admitting: *Deleted

## 2022-12-26 ENCOUNTER — Ambulatory Visit: Payer: Medicare Other | Admitting: Physician Assistant

## 2022-12-26 ENCOUNTER — Ambulatory Visit: Payer: Medicare Other

## 2022-12-26 ENCOUNTER — Other Ambulatory Visit: Payer: Medicare Other

## 2022-12-26 MED ORDER — TRAZODONE HCL 50 MG PO TABS: 50.0000 mg | ORAL_TABLET | Freq: Every evening | ORAL | 0 refills | Status: DC | PRN

## 2022-12-26 NOTE — Telephone Encounter (Signed)
     Patient  visit on 12/22/2022  at [facility]  was for treatment   Have you been able to follow up with your primary care physician? Follow up visit scheduled with pcp next week has followed up with oncoligist  The patient was or was not able to obtain any needed medicine or equipment.  Are there diet recommendations that you are having difficulty following?  Patient expresses understanding of discharge instructions and education provided has no other needs at this time.   [place] [phone] [address] , [place] [address] Email : [email or web address]

## 2022-12-30 ENCOUNTER — Inpatient Hospital Stay: Payer: Medicare Other | Admitting: Dietician

## 2022-12-30 ENCOUNTER — Other Ambulatory Visit: Payer: Self-pay | Admitting: *Deleted

## 2022-12-30 MED ORDER — FUROSEMIDE 40 MG PO TABS: 40.0000 mg | ORAL_TABLET | ORAL | 2 refills | Status: DC | PRN

## 2022-12-30 NOTE — Progress Notes (Signed)
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 He is receiving ipilimumab + nivolumab q42d (start 3/26)  Past medical history includes prostate cancer (2009), MCL of colon (2014), CAD, HTN, OSA, GERD, IDDM2, diabetic peripheral neuropathy, CKD3, B12 deficiency  Spoke with daughter of patient Bonita Quin) via telephone. She reports patient is not eating much. He has altered taste. His wife is preparing meals as normal, but he often refuses to eat. This is hard for wife to understand. She thinks pt does not like her cooking. Bonita Quin reports he does like the taste of sweets. Patient will eat protein bars and drink Ensure if given these. Says patient will not get up to get them. Bonita Quin reports pt is wearing Libre monitor. Patient wife is not comfortable giving him insulin. Bonita Quin reports pt previously managing IDDM without difficulty. Patient has become argumentative and increasingly confused recently per Bonita Quin. She reports lower extremity swelling has improved some. Denies further weeping. Reports legs were splotchy red and cold yesterday. He is taking 40 mg lasix. Bonita Quin unsure how much water he is drinking. Patient wears adult briefs s/p urinary sphincter implant (2018). She does not think he has been changing himself as frequently as needed. Bonita Quin expresses her concern about pt quality of life. Home health care recommended previously, however wife of pt declined services at that time.    Nutrition Focused Physical Exam: unable to complete (telephone visit)   Medications: lasix 40mg , norvasc, lipitor, folic acid, dilaudid, mag-ox, antivert, reglan, protonix, humalog, megace, lantus   Labs: 4/1 - Hgb 7.8, Na 131, BUN 42, Cr 1.39, Albumin 2.8   Anthropometrics:   Height: 5'11" Weight: 233 lb 0.4 oz (3/30) - wt obtained in ED, suspect wt increase related to fluid status UBW: 217 lb  BMI: 32.5    NUTRITION  DIAGNOSIS: Inadequate oral intake related to altered taste, poor appetite as evidenced by family report    INTERVENTION:  Per Bonita Quin, pt to see PCP on 4/10 - consider pump given poorly controlled IDDM, ?UTI given AMS reported, HHPT given reported weakness/deconditioning   Suggested keeping protein bars and ONS within reach to encouraged po Suggested sweet marinades/sauces for meats, adding fruit to plates to add sweetness Educated on protein foods, recommend protein source with all meals/snacks   MONITORING, EVALUATION, GOAL: Pt will tolerate increased calories and protein to promote stable blood glucose and minimize wt loss    Next Visit: Monday April 11 via telephone with daughter

## 2022-12-31 ENCOUNTER — Other Ambulatory Visit: Payer: Self-pay

## 2022-12-31 DIAGNOSIS — R0602 Shortness of breath: Secondary | ICD-10-CM | POA: Diagnosis not present

## 2022-12-31 DIAGNOSIS — R918 Other nonspecific abnormal finding of lung field: Secondary | ICD-10-CM | POA: Diagnosis not present

## 2022-12-31 DIAGNOSIS — R0902 Hypoxemia: Secondary | ICD-10-CM | POA: Diagnosis not present

## 2023-01-01 ENCOUNTER — Other Ambulatory Visit: Payer: Self-pay

## 2023-01-01 ENCOUNTER — Emergency Department (HOSPITAL_COMMUNITY): Payer: Medicare Other

## 2023-01-01 ENCOUNTER — Inpatient Hospital Stay: Payer: Medicare Other | Admitting: Licensed Clinical Social Worker

## 2023-01-01 ENCOUNTER — Encounter (HOSPITAL_COMMUNITY): Payer: Self-pay | Admitting: Emergency Medicine

## 2023-01-01 ENCOUNTER — Inpatient Hospital Stay (HOSPITAL_COMMUNITY)
Admission: EM | Admit: 2023-01-01 | Discharge: 2023-01-22 | DRG: 180 | Disposition: E | Payer: Medicare Other | Source: Ambulatory Visit | Attending: Family Medicine | Admitting: Family Medicine

## 2023-01-01 DIAGNOSIS — C3490 Malignant neoplasm of unspecified part of unspecified bronchus or lung: Principal | ICD-10-CM

## 2023-01-01 DIAGNOSIS — Z91048 Other nonmedicinal substance allergy status: Secondary | ICD-10-CM

## 2023-01-01 DIAGNOSIS — R6251 Failure to thrive (child): Secondary | ICD-10-CM | POA: Diagnosis present

## 2023-01-01 DIAGNOSIS — E1122 Type 2 diabetes mellitus with diabetic chronic kidney disease: Secondary | ICD-10-CM | POA: Diagnosis present

## 2023-01-01 DIAGNOSIS — Z833 Family history of diabetes mellitus: Secondary | ICD-10-CM

## 2023-01-01 DIAGNOSIS — R000 Tachycardia, unspecified: Secondary | ICD-10-CM | POA: Diagnosis present

## 2023-01-01 DIAGNOSIS — G939 Disorder of brain, unspecified: Secondary | ICD-10-CM | POA: Diagnosis not present

## 2023-01-01 DIAGNOSIS — Z515 Encounter for palliative care: Secondary | ICD-10-CM

## 2023-01-01 DIAGNOSIS — E871 Hypo-osmolality and hyponatremia: Secondary | ICD-10-CM | POA: Diagnosis present

## 2023-01-01 DIAGNOSIS — E782 Mixed hyperlipidemia: Secondary | ICD-10-CM | POA: Diagnosis not present

## 2023-01-01 DIAGNOSIS — E669 Obesity, unspecified: Secondary | ICD-10-CM | POA: Diagnosis present

## 2023-01-01 DIAGNOSIS — C7951 Secondary malignant neoplasm of bone: Secondary | ICD-10-CM | POA: Diagnosis not present

## 2023-01-01 DIAGNOSIS — Z9109 Other allergy status, other than to drugs and biological substances: Secondary | ICD-10-CM

## 2023-01-01 DIAGNOSIS — Z8616 Personal history of COVID-19: Secondary | ICD-10-CM | POA: Diagnosis not present

## 2023-01-01 DIAGNOSIS — I10 Essential (primary) hypertension: Secondary | ICD-10-CM | POA: Diagnosis not present

## 2023-01-01 DIAGNOSIS — N1831 Chronic kidney disease, stage 3a: Secondary | ICD-10-CM | POA: Diagnosis not present

## 2023-01-01 DIAGNOSIS — G9340 Encephalopathy, unspecified: Secondary | ICD-10-CM | POA: Diagnosis not present

## 2023-01-01 DIAGNOSIS — Z66 Do not resuscitate: Secondary | ICD-10-CM | POA: Diagnosis present

## 2023-01-01 DIAGNOSIS — G9389 Other specified disorders of brain: Secondary | ICD-10-CM | POA: Diagnosis not present

## 2023-01-01 DIAGNOSIS — E1140 Type 2 diabetes mellitus with diabetic neuropathy, unspecified: Secondary | ICD-10-CM | POA: Diagnosis not present

## 2023-01-01 DIAGNOSIS — E1165 Type 2 diabetes mellitus with hyperglycemia: Secondary | ICD-10-CM | POA: Diagnosis not present

## 2023-01-01 DIAGNOSIS — R400 Somnolence: Secondary | ICD-10-CM | POA: Diagnosis not present

## 2023-01-01 DIAGNOSIS — I129 Hypertensive chronic kidney disease with stage 1 through stage 4 chronic kidney disease, or unspecified chronic kidney disease: Secondary | ICD-10-CM | POA: Diagnosis present

## 2023-01-01 DIAGNOSIS — N1832 Chronic kidney disease, stage 3b: Secondary | ICD-10-CM | POA: Diagnosis not present

## 2023-01-01 DIAGNOSIS — Z794 Long term (current) use of insulin: Secondary | ICD-10-CM | POA: Diagnosis not present

## 2023-01-01 DIAGNOSIS — D631 Anemia in chronic kidney disease: Secondary | ICD-10-CM | POA: Diagnosis present

## 2023-01-01 DIAGNOSIS — R451 Restlessness and agitation: Secondary | ICD-10-CM | POA: Diagnosis present

## 2023-01-01 DIAGNOSIS — C3492 Malignant neoplasm of unspecified part of left bronchus or lung: Principal | ICD-10-CM | POA: Diagnosis present

## 2023-01-01 DIAGNOSIS — N179 Acute kidney failure, unspecified: Secondary | ICD-10-CM | POA: Diagnosis not present

## 2023-01-01 DIAGNOSIS — R4189 Other symptoms and signs involving cognitive functions and awareness: Secondary | ICD-10-CM | POA: Diagnosis present

## 2023-01-01 DIAGNOSIS — Z743 Need for continuous supervision: Secondary | ICD-10-CM | POA: Diagnosis not present

## 2023-01-01 DIAGNOSIS — I2510 Atherosclerotic heart disease of native coronary artery without angina pectoris: Secondary | ICD-10-CM | POA: Diagnosis not present

## 2023-01-01 DIAGNOSIS — Z91030 Bee allergy status: Secondary | ICD-10-CM

## 2023-01-01 DIAGNOSIS — R739 Hyperglycemia, unspecified: Secondary | ICD-10-CM | POA: Diagnosis not present

## 2023-01-01 DIAGNOSIS — Z91040 Latex allergy status: Secondary | ICD-10-CM

## 2023-01-01 DIAGNOSIS — E1169 Type 2 diabetes mellitus with other specified complication: Secondary | ICD-10-CM | POA: Diagnosis present

## 2023-01-01 DIAGNOSIS — Z87440 Personal history of urinary (tract) infections: Secondary | ICD-10-CM

## 2023-01-01 DIAGNOSIS — Z8572 Personal history of non-Hodgkin lymphomas: Secondary | ICD-10-CM

## 2023-01-01 DIAGNOSIS — T380X5A Adverse effect of glucocorticoids and synthetic analogues, initial encounter: Secondary | ICD-10-CM | POA: Diagnosis present

## 2023-01-01 DIAGNOSIS — G9341 Metabolic encephalopathy: Secondary | ICD-10-CM | POA: Diagnosis present

## 2023-01-01 DIAGNOSIS — R32 Unspecified urinary incontinence: Secondary | ICD-10-CM | POA: Diagnosis present

## 2023-01-01 DIAGNOSIS — Z9225 Personal history of immunosupression therapy: Secondary | ICD-10-CM

## 2023-01-01 DIAGNOSIS — Z7189 Other specified counseling: Secondary | ICD-10-CM | POA: Diagnosis not present

## 2023-01-01 DIAGNOSIS — Z800 Family history of malignant neoplasm of digestive organs: Secondary | ICD-10-CM

## 2023-01-01 DIAGNOSIS — C7971 Secondary malignant neoplasm of right adrenal gland: Secondary | ICD-10-CM | POA: Diagnosis not present

## 2023-01-01 DIAGNOSIS — E1142 Type 2 diabetes mellitus with diabetic polyneuropathy: Secondary | ICD-10-CM | POA: Diagnosis not present

## 2023-01-01 DIAGNOSIS — Z9221 Personal history of antineoplastic chemotherapy: Secondary | ICD-10-CM

## 2023-01-01 DIAGNOSIS — G4733 Obstructive sleep apnea (adult) (pediatric): Secondary | ICD-10-CM | POA: Diagnosis present

## 2023-01-01 DIAGNOSIS — Z885 Allergy status to narcotic agent status: Secondary | ICD-10-CM

## 2023-01-01 DIAGNOSIS — Z8546 Personal history of malignant neoplasm of prostate: Secondary | ICD-10-CM

## 2023-01-01 DIAGNOSIS — C7931 Secondary malignant neoplasm of brain: Secondary | ICD-10-CM | POA: Diagnosis present

## 2023-01-01 DIAGNOSIS — Z923 Personal history of irradiation: Secondary | ICD-10-CM

## 2023-01-01 DIAGNOSIS — F419 Anxiety disorder, unspecified: Secondary | ICD-10-CM | POA: Diagnosis present

## 2023-01-01 DIAGNOSIS — E1151 Type 2 diabetes mellitus with diabetic peripheral angiopathy without gangrene: Secondary | ICD-10-CM | POA: Diagnosis not present

## 2023-01-01 DIAGNOSIS — E781 Pure hyperglyceridemia: Secondary | ICD-10-CM | POA: Diagnosis not present

## 2023-01-01 DIAGNOSIS — R600 Localized edema: Secondary | ICD-10-CM | POA: Diagnosis not present

## 2023-01-01 DIAGNOSIS — R5381 Other malaise: Secondary | ICD-10-CM | POA: Diagnosis present

## 2023-01-01 DIAGNOSIS — C7972 Secondary malignant neoplasm of left adrenal gland: Secondary | ICD-10-CM | POA: Diagnosis present

## 2023-01-01 DIAGNOSIS — Z85038 Personal history of other malignant neoplasm of large intestine: Secondary | ICD-10-CM

## 2023-01-01 DIAGNOSIS — Z888 Allergy status to other drugs, medicaments and biological substances status: Secondary | ICD-10-CM

## 2023-01-01 DIAGNOSIS — Z7982 Long term (current) use of aspirin: Secondary | ICD-10-CM

## 2023-01-01 DIAGNOSIS — Z79899 Other long term (current) drug therapy: Secondary | ICD-10-CM

## 2023-01-01 DIAGNOSIS — Z6830 Body mass index (BMI) 30.0-30.9, adult: Secondary | ICD-10-CM | POA: Diagnosis not present

## 2023-01-01 DIAGNOSIS — E876 Hypokalemia: Secondary | ICD-10-CM | POA: Diagnosis present

## 2023-01-01 DIAGNOSIS — R627 Adult failure to thrive: Secondary | ICD-10-CM | POA: Diagnosis present

## 2023-01-01 DIAGNOSIS — R54 Age-related physical debility: Secondary | ICD-10-CM | POA: Diagnosis present

## 2023-01-01 DIAGNOSIS — E860 Dehydration: Secondary | ICD-10-CM | POA: Diagnosis present

## 2023-01-01 DIAGNOSIS — K219 Gastro-esophageal reflux disease without esophagitis: Secondary | ICD-10-CM | POA: Diagnosis present

## 2023-01-01 DIAGNOSIS — C7801 Secondary malignant neoplasm of right lung: Secondary | ICD-10-CM | POA: Diagnosis not present

## 2023-01-01 DIAGNOSIS — C7802 Secondary malignant neoplasm of left lung: Secondary | ICD-10-CM | POA: Diagnosis not present

## 2023-01-01 DIAGNOSIS — Z7951 Long term (current) use of inhaled steroids: Secondary | ICD-10-CM

## 2023-01-01 DIAGNOSIS — E538 Deficiency of other specified B group vitamins: Secondary | ICD-10-CM | POA: Diagnosis present

## 2023-01-01 DIAGNOSIS — Z9079 Acquired absence of other genital organ(s): Secondary | ICD-10-CM

## 2023-01-01 LAB — BRAIN NATRIURETIC PEPTIDE: B Natriuretic Peptide: 279 pg/mL — ABNORMAL HIGH (ref 0.0–100.0)

## 2023-01-01 LAB — COMPREHENSIVE METABOLIC PANEL
ALT: 27 U/L (ref 0–44)
AST: 26 U/L (ref 15–41)
Albumin: 2.7 g/dL — ABNORMAL LOW (ref 3.5–5.0)
Alkaline Phosphatase: 71 U/L (ref 38–126)
Anion gap: 12 (ref 5–15)
BUN: 49 mg/dL — ABNORMAL HIGH (ref 8–23)
CO2: 24 mmol/L (ref 22–32)
Calcium: 9 mg/dL (ref 8.9–10.3)
Chloride: 98 mmol/L (ref 98–111)
Creatinine, Ser: 1.44 mg/dL — ABNORMAL HIGH (ref 0.61–1.24)
GFR, Estimated: 48 mL/min — ABNORMAL LOW (ref >60)
Glucose, Bld: 275 mg/dL — ABNORMAL HIGH (ref 70–99)
Potassium: 3.3 mmol/L — ABNORMAL LOW (ref 3.5–5.1)
Sodium: 134 mmol/L — ABNORMAL LOW (ref 135–145)
Total Bilirubin: 0.8 mg/dL (ref 0.3–1.2)
Total Protein: 6.2 g/dL — ABNORMAL LOW (ref 6.5–8.1)

## 2023-01-01 LAB — CBC WITH DIFFERENTIAL/PLATELET
Abs Immature Granulocytes: 0.05 10*3/uL (ref 0.00–0.07)
Basophils Absolute: 0 10*3/uL (ref 0.0–0.1)
Basophils Relative: 0 %
Eosinophils Absolute: 0.1 10*3/uL (ref 0.0–0.5)
Eosinophils Relative: 2 %
HCT: 24 % — ABNORMAL LOW (ref 39.0–52.0)
Hemoglobin: 7.5 g/dL — ABNORMAL LOW (ref 13.0–17.0)
Immature Granulocytes: 1 %
Lymphocytes Relative: 11 %
Lymphs Abs: 0.6 10*3/uL — ABNORMAL LOW (ref 0.7–4.0)
MCH: 29 pg (ref 26.0–34.0)
MCHC: 31.3 g/dL (ref 30.0–36.0)
MCV: 92.7 fL (ref 80.0–100.0)
Monocytes Absolute: 0.4 10*3/uL (ref 0.1–1.0)
Monocytes Relative: 8 %
Neutro Abs: 4.1 10*3/uL (ref 1.7–7.7)
Neutrophils Relative %: 78 %
Platelets: 155 10*3/uL (ref 150–400)
RBC: 2.59 MIL/uL — ABNORMAL LOW (ref 4.22–5.81)
RDW: 18.5 % — ABNORMAL HIGH (ref 11.5–15.5)
WBC: 5.3 10*3/uL (ref 4.0–10.5)
nRBC: 0 % (ref 0.0–0.2)

## 2023-01-01 LAB — GLUCOSE, CAPILLARY: Glucose-Capillary: 285 mg/dL — ABNORMAL HIGH (ref 70–99)

## 2023-01-01 LAB — MAGNESIUM: Magnesium: 1.7 mg/dL (ref 1.7–2.4)

## 2023-01-01 LAB — LACTIC ACID, PLASMA: Lactic Acid, Venous: 1.1 mmol/L (ref 0.5–1.9)

## 2023-01-01 MED ORDER — HYDROCORTISONE SOD SUC (PF) 100 MG IJ SOLR
100.0000 mg | Freq: Three times a day (TID) | INTRAMUSCULAR | Status: DC
  Administered 2023-01-01 – 2023-01-02 (×3): 100 mg via INTRAVENOUS
  Filled 2023-01-01 (×3): qty 2

## 2023-01-01 MED ORDER — POTASSIUM CHLORIDE 10 MEQ/100ML IV SOLN
10.0000 meq | INTRAVENOUS | Status: AC
  Administered 2023-01-01 (×3): 10 meq via INTRAVENOUS
  Filled 2023-01-01 (×3): qty 100

## 2023-01-01 MED ORDER — ACETAMINOPHEN 325 MG PO TABS
650.0000 mg | ORAL_TABLET | Freq: Four times a day (QID) | ORAL | Status: DC | PRN
  Administered 2023-01-02: 650 mg via ORAL
  Filled 2023-01-01: qty 2

## 2023-01-01 MED ORDER — HEPARIN SODIUM (PORCINE) 5000 UNIT/ML IJ SOLN: 5000.0000 [IU] | Freq: Three times a day (TID) | INTRAMUSCULAR | Status: DC

## 2023-01-01 MED ORDER — SODIUM CHLORIDE 0.9 % IV SOLN: INTRAVENOUS | Status: DC

## 2023-01-01 MED ORDER — LEVETIRACETAM IN NACL 500 MG/100ML IV SOLN
500.0000 mg | Freq: Two times a day (BID) | INTRAVENOUS | Status: DC
  Administered 2023-01-01 – 2023-01-05 (×9): 500 mg via INTRAVENOUS
  Filled 2023-01-01 (×9): qty 100

## 2023-01-01 MED ORDER — INSULIN GLARGINE (1 UNIT DIAL) 300 UNIT/ML ~~LOC~~ SOPN: 20.0000 [IU] | PEN_INJECTOR | Freq: Every day | SUBCUTANEOUS | Status: DC

## 2023-01-01 MED ORDER — INSULIN GLARGINE-YFGN 100 UNIT/ML ~~LOC~~ SOLN
20.0000 [IU] | Freq: Every day | SUBCUTANEOUS | Status: DC
  Administered 2023-01-01 – 2023-01-03 (×3): 20 [IU] via SUBCUTANEOUS
  Filled 2023-01-01 (×5): qty 0.2

## 2023-01-01 MED ORDER — MOMETASONE FURO-FORMOTEROL FUM 200-5 MCG/ACT IN AERO
2.0000 | INHALATION_SPRAY | Freq: Two times a day (BID) | RESPIRATORY_TRACT | Status: DC
  Administered 2023-01-01 – 2023-01-02 (×2): 2 via RESPIRATORY_TRACT
  Filled 2023-01-01: qty 8.8

## 2023-01-01 MED ORDER — INSULIN ASPART 100 UNIT/ML IJ SOLN
0.0000 [IU] | Freq: Three times a day (TID) | INTRAMUSCULAR | Status: DC
  Administered 2023-01-02: 8 [IU] via SUBCUTANEOUS

## 2023-01-01 MED ORDER — DEXAMETHASONE SODIUM PHOSPHATE 10 MG/ML IJ SOLN
10.0000 mg | Freq: Once | INTRAMUSCULAR | Status: AC
  Administered 2023-01-01: 10 mg via INTRAVENOUS
  Filled 2023-01-01: qty 1

## 2023-01-01 MED ORDER — GADOBUTROL 1 MMOL/ML IV SOLN
10.0000 mL | Freq: Once | INTRAVENOUS | Status: AC | PRN
  Administered 2023-01-01: 10 mL via INTRAVENOUS

## 2023-01-01 MED ORDER — ACETAMINOPHEN 650 MG RE SUPP: 650.0000 mg | Freq: Four times a day (QID) | RECTAL | Status: DC | PRN

## 2023-01-01 NOTE — ED Notes (Signed)
Spoke to [facility] regarding pt's semglee. AC to bring to ED or to the floor

## 2023-01-01 NOTE — ED Notes (Signed)
Patient transported to MRI 

## 2023-01-01 NOTE — H&P (Addendum)
TRH H&P   Patient Demographics:    [name], is a 88 y.o. male  MRN: [id number]   DOB - 03-24-1935  Admit Date - 01/28/2023  Outpatient Primary MD for the patient is Benita Stabile, MD  Referring MD/NP/PA: DR Jeraldine Loots  Outpatient Specialists: Oncology Dr. Ellin Saba  Patient coming from: PCP Dr. Margo Aye office  Chief Complaint  Patient presents with   Failure To Thrive      HPI:    [name]  is a 88 y.o. male, e with extensive cancer history significant for mantle cell lymphoma (Dx 08/2013 S/P chemotherapy, follows with Dr. 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 Rudell Cobb at [facility]).    -Patient is altered, most of the history was obtained by wife at bedside, and daughter by phone, patient had PCP visit today, where he was noted to be somnolent, withdrawn, so he was sent by by his PCP for further evaluation, patient his first course of immunotherapy, he was scheduled for an excision tomorrow, but he is somnolent, with progressive weakness, very poor intake and appetite, no fall, no fever, no chills -In ED his workup was significant for low potassium at 3.3, hyponatremia at 134, baseline creatinine of 1.4, known anemia with hemoglobin of 7.5, MRI brain was obtained which was unfortunately significant for new brain metastasis 8 mm brain metastasis with surrounding edema, [facility] hospitalist consulted to admit.   Review of systems:    A full 10 point  Review of Systems was done, except as stated above, all other Review of Systems were negative.   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, anomalous circumflex - no PCIs   Essential hypertension, benign    GERD (gastroesophageal reflux disease)    Mixed hyperlipidemia    Obstructive sleep apnea    does not use   Prostate cancer    PVD (peripheral vascular disease)    Type 2 diabetes mellitus       Past Surgical History:  Procedure Laterality Date   BACK SURGERY     BALLOON DILATION N/A 08/27/2013   Procedure: BALLOON DILATION;  Surgeon: Malissa Hippo, MD;  [facility];  Service: Endoscopy;  Laterality: N/A;   COLON SURGERY     COLONOSCOPY  N/A 12/17/2013   Procedure: COLONOSCOPY;  Surgeon: Malissa Hippo, MD;  [facility];  Service: Endoscopy;  Laterality: N/A;  940   COLONOSCOPY N/A 02/28/2014   Procedure: COLONOSCOPY;  Surgeon: Malissa Hippo, MD;  [facility];  Service: Endoscopy;  Laterality: N/A;  730   COLONOSCOPY N/A 08/04/2015   Procedure: COLONOSCOPY;  Surgeon: Malissa Hippo, MD;  [facility];  Service: Endoscopy;  Laterality: N/A;  200 - moved to 11/11 @ 2:10 - Ann to notify pt   COLONOSCOPY WITH ESOPHAGOGASTRODUODENOSCOPY (EGD) N/A 08/27/2013   Procedure: COLONOSCOPY WITH ESOPHAGOGASTRODUODENOSCOPY (EGD);  Surgeon: Malissa Hippo, MD;  [facility];  Service: Endoscopy;  Laterality: N/A;  730   COLONOSCOPY WITH PROPOFOL N/A 01/02/2022   Procedure: COLONOSCOPY WITH PROPOFOL;  Surgeon: Malissa Hippo, MD;  [facility];  Service: Endoscopy;  Laterality: N/A;  1205   CYSTOSCOPY N/A 06/09/2021   Procedure:  CYSTOSCOPY;  Surgeon: Crista Elliot, MD;  Location: [facility];  Service: Urology;  Laterality: N/A;   IR IMAGING GUIDED PORT INSERTION  11/05/2022   IR REMOVAL TUN ACCESS W/ PORT W/O FL MOD SED  07/17/2018   IR US GUIDE BX ASP/DRAIN  11/05/2022   KNEE ARTHROSCOPY Left    KNEE SURGERY Right    total knee   MALONEY DILATION N/A 08/27/2013   Procedure: MALONEY DILATION;  Surgeon: Malissa Hippo, MD;  [facility];  Service: Endoscopy;  Laterality: N/A;   POLYPECTOMY  01/02/2022   Procedure: POLYPECTOMY;  Surgeon: Malissa Hippo, MD;  [facility];  Service: Endoscopy;;  cecal;ascending;proximal, sigmoid;rectal;   PORTACATH PLACEMENT Right 09/23/2012   PROSTATECTOMY     REMOVAL OF PENILE PROSTHESIS N/A 06/09/2021   Procedure: REMOVAL OF ARTIFICIAL URINARY SPHINCER; REMOVAL OF INFLATABLE PENILE PROSTHESIS;  Surgeon: Crista Elliot, MD;  Location: [facility];  Service: Urology;  Laterality: N/A;   removal of port Right    SAVORY DILATION N/A 08/27/2013   Procedure: SAVORY DILATION;  Surgeon: Malissa Hippo, MD;  [facility];  Service: Endoscopy;  Laterality: N/A;   SHOULDER SURGERY     TONSILLECTOMY     URINARY SPHINCTER IMPLANT N/A 01/23/2017   artificial urinary sphincter implant by Dr. Gillian Shields   VASECTOMY        Social History:     Social History   Tobacco Use   Smoking status: Never   Smokeless tobacco: Never  Substance Use Topics   Alcohol use: No       Family History :     Family History  Problem Relation Age of Onset   Colon cancer Brother    Cancer Brother    Diabetes Father    Cancer Brother      Home Medications:   Prior to Admission medications   Medication Sig Start Date End Date Taking? Authorizing Provider  furosemide (LASIX) 40 MG tablet Take 1 tablet (40 mg total) by mouth as needed for fluid. 12/30/22   Doreatha Massed, MD  nystatin (MYCOSTATIN/NYSTOP) powder Apply topically. 12/27/22 01/26/23 Yes [provider]  acetaminophen (TYLENOL) 325 MG tablet Take 650 mg by mouth every 6 (six) hours as needed for mild pain or moderate pain.    [provider]  albuterol (PROAIR HFA) 108 (90 Base) MCG/ACT inhaler Inhale 2 puffs into the lungs at bedtime as needed for wheezing or shortness of breath. 05/12/20   Sandrea Hughs  B, MD  amLODipine (NORVASC) 10 MG tablet Take 1 tablet (10 mg total) by mouth daily. Patient taking differently: Take 5 mg by mouth daily. 01/11/22   Tereso Newcomer T, PA-C  aspirin EC 81 MG tablet Take 81 mg by mouth daily. 04/29/22   [provider]  atorvastatin (LIPITOR) 40 MG tablet Take 40 mg by mouth daily. 10/05/21   [provider]  blood glucose meter kit and supplies KIT Dispense based on patient and insurance preference. Use at least three times a day to check BS levels and fluctuation. 09/04/21   Vassie Loll, MD  budesonide-formoterol [facility]) 160-4.5 MCG/ACT inhaler Inhale 2 puffs into the lungs 2 (two) times daily. 05/12/20   Nyoka Cowden, MD  dimenhyDRINATE (DRAMAMINE) 50 MG tablet Take 50 mg by mouth every 8 (eight) hours as needed for nausea.    [provider]  folic acid (FOLVITE) 1 MG tablet Take 1 tablet (1 mg total) by mouth daily. 11/13/22   Doreatha Massed, MD  HYDROmorphone (DILAUDID) 2 MG tablet Take 1 tablet (2 mg total) by mouth every 6 (six) hours as needed. 12/18/22   Doreatha Massed, MD  insulin lispro (HUMALOG KWIKPEN) 100 UNIT/ML KwikPen Inject 10 Units into the skin 3 (three) times daily. 4-8 units 12/03/22   Shalhoub, Deno Lunger, MD  Ipilimumab (YERVOY IV) Inject into the vein. 12/19/22   [provider]  lidocaine-prilocaine (EMLA) cream Apply to affected area once Patient not taking: Reported on 12/23/2022 12/17/22   Doreatha Massed, MD  magnesium oxide (MAG-OX) 400 (240 Mg) MG tablet Take 1 tablet (400 mg total) by mouth daily. 12/23/22   Doreatha Massed, MD  meclizine (ANTIVERT) 25 MG  tablet Take 1 tablet (25 mg total) by mouth 3 (three) times daily as needed for dizziness. 10/15/21   Erick Blinks, MD  megestrol (MEGACE) 400 MG/10ML suspension Take 10 mLs (400 mg total) by mouth 2 (two) times daily. 11/25/22   Doreatha Massed, MD  metoCLOPramide (REGLAN) 10 MG tablet Take 1 tablet (10 mg total) by mouth every 6 (six) hours as needed for nausea. 11/25/22   Doreatha Massed, MD  Nivolumab (OPDIVO IV) Inject into the vein. 12/20/22   [provider]  nystatin (MYCOSTATIN/NYSTOP) powder Apply topically. 09/24/22   [provider]  pantoprazole (PROTONIX) 40 MG tablet Take 40 mg by mouth 2 (two) times daily. 11/08/22   [provider]  testosterone cypionate (DEPOTESTOSTERONE CYPIONATE) 200 MG/ML injection Inject 200 mg into the muscle every 14 (fourteen) days.  04/29/16   [provider]  TOUJEO SOLOSTAR 300 UNIT/ML Solostar Pen Inject 40 Units into the skin daily. 12/03/22   Shalhoub, Deno Lunger, MD  traZODone (DESYREL) 50 MG tablet Take 1 tablet (50 mg total) by mouth at bedtime as needed for sleep. 12/26/22   Doreatha Massed, MD     Allergies:     Allergies  Allergen Reactions   Bee Venom Anaphylaxis   Iodine     Pt does not remember reaction  Other Reaction(s): GI Intolerance, Hypotension, Other (See Comments)  syncope   Adhesive [Tape] Hives   Ciprofloxacin     Pt does not remember reaction    Povidone-Iodine     Pt does not remember reaction    Simvastatin     Pt does not remember reaction    Codeine     Pt does not remember reaction  Other Reaction(s): GI Intolerance   Latex Rash     Physical Exam:   Vitals  Blood pressure 121/62, pulse (!) 107, temperature 98.1 F (36.7 C), temperature source Oral, resp. rate (!) 26, SpO2 96 %.   1. General elderly male, laying in bed, frail, deconditioned  2.  Patient is somnolent, but wakes up and answer some questions, he is oriented x 2, confused  3. No F.N deficits, ALL  C.Nerves Intact, Strength 5/5 all 4 extremities, Sensation intact all 4 extremities, Plantars down going.  4. Ears and Eyes appear Normal, Conjunctivae clear, PERRLA. Moist Oral Mucosa.  5. Supple Neck, No JVD, No cervical lymphadenopathy appriciated, No Carotid Bruits.  6. Symmetrical Chest wall movement, diminished air entry at the bases  7. RRR, No Gallops, Rubs or Murmurs, No Parasternal Heave.+1 edema  8. Positive Bowel Sounds, Abdomen Soft, No tenderness, No organomegaly appriciated,No rebound -guarding or rigidity.  9.  No Cyanosis, Normal Skin Turgor, No Skin Rash or Bruise.  10. Good muscle tone,  joints appear normal , no effusions, Normal ROM.     Data Review:    CBC Recent Labs  Lab 11-Jan-2023 1316  WBC 5.3  HGB 7.5*  HCT 24.0*  PLT 155  MCV 92.7  MCH 29.0  MCHC 31.3  RDW 18.5*  LYMPHSABS 0.6*  MONOABS 0.4  EOSABS 0.1  BASOSABS 0.0   ------------------------------------------------------------------------------------------------------------------  Chemistries  Recent Labs  Lab 01-11-23 1316  NA 134*  K 3.3*  CL 98  CO2 24  GLUCOSE 275*  BUN 49*  CREATININE 1.44*  CALCIUM 9.0  MG 1.7  AST 26  ALT 27  ALKPHOS 71  BILITOT 0.8   ------------------------------------------------------------------------------------------------------------------ estimated creatinine clearance is 46.4 mL/min (A) (by C-G formula based on SCr of 1.44 mg/dL (H)). ------------------------------------------------------------------------------------------------------------------ No results for input(s): "TSH", "T4TOTAL", "T3FREE", "THYROIDAB" in the last 72 hours.  Invalid input(s): "FREET3"  Coagulation profile No results for input(s): "INR", "PROTIME" in the last 168 hours. ------------------------------------------------------------------------------------------------------------------- No results for input(s): "DDIMER" in the last 72  hours. -------------------------------------------------------------------------------------------------------------------  Cardiac Enzymes No results for input(s): "CKMB", "TROPONINI", "MYOGLOBIN" in the last 168 hours.  Invalid input(s): "CK" ------------------------------------------------------------------------------------------------------------------    Component Value Date/Time   BNP 279.0 (H) 2023-01-11 1316     ---------------------------------------------------------------------------------------------------------------  Urinalysis    Component Value Date/Time   COLORURINE YELLOW 12/22/2022 0021   APPEARANCEUR CLEAR 12/22/2022 0021   LABSPEC 1.016 12/22/2022 0021   PHURINE 5.0 12/22/2022 0021   GLUCOSEU 50 (A) 12/22/2022 0021   HGBUR NEGATIVE 12/22/2022 0021   BILIRUBINUR NEGATIVE 12/22/2022 0021   BILIRUBINUR negative 05/04/2021 1702   KETONESUR NEGATIVE 12/22/2022 0021   PROTEINUR NEGATIVE 12/22/2022 0021   UROBILINOGEN 1.0 05/04/2021 1702   UROBILINOGEN 0.2 06/30/2015 1552   NITRITE NEGATIVE 12/22/2022 0021   LEUKOCYTESUR TRACE (A) 12/22/2022 0021    ----------------------------------------------------------------------------------------------------------------   Imaging Results:    MR BRAIN W WO CONTRAST  Result Date: 01-11-23 CLINICAL DATA:  Mental status change, unknown cause Tech note: History of colon AND prostate cancer. EXAM: MRI HEAD WITHOUT AND WITH CONTRAST TECHNIQUE: Multiplanar, multiecho pulse sequences of the brain and surrounding structures were obtained without and with intravenous contrast. CONTRAST:  10mL GADAVIST GADOBUTROL 1 MMOL/ML IV SOLN COMPARISON:  MRI October 28, 2022. FINDINGS: Brain: Approximately 8 mm peripherally enhancing lesion in the high right parietal lobe, compatible with a metastasis. Surrounding edema without significant mass effect. No evidence of acute infarct, acute hemorrhage, midline shift or hydrocephalus. Vascular:  Major arterial flow voids are maintained. Skull and upper cervical spine: Enhancing left sphenoid bone lesion. Sinuses/Orbits: Paranasal sinus mucosal thickening.  No acute orbital findings. Other: No mastoid effusions. IMPRESSION: 1. Approximately 8 mm peripherally enhancing lesion in the high right parietal lobe, compatible with a metastasis. Surrounding edema without significant mass effect. 2. Enhancing left sphenoid bone lesion, suspicious for osseous metastasis. Electronically Signed   By: Feliberto Harts M.D.   On: 12/31/2022 14:29   DG Chest Port 1 View  Result Date: 12/24/2022 CLINICAL DATA:  LOC EXAM: PORTABLE CHEST - 1 VIEW COMPARISON:  12/21/2022 FINDINGS: Cardiac silhouette is unremarkable. No pneumothorax or pleural effusion. Mild vascular congestion without focal consolidation. The visualized skeletal structures are unremarkable. IMPRESSION: Vascular congestion without focal consolidation. Electronically Signed   By: Layla Maw M.D.   On: 12/23/2022 13:33    EKG:  Vent. rate 112 BPM PR interval 51 ms QRS duration 86 ms QT/QTcB 326/415 ms P-R-T axes 0 -13 68 Sinus tachycardia Multiple premature complexes, vent & supraven Short PR interval Low voltage, extremity leads  Assessment & Plan:    Principal Problem:   Failure to thrive (child) Active Problems:   Acute metabolic encephalopathy   Primary lung adenocarcinoma, left   Essential hypertension   CAD (coronary artery disease)   Failure to thrive  Acute metabolic encephalopathy  -She presents with progressive weakness, deconditioning, poor oral intake and increased confusion . -This is most likely in the setting of his metastatic lung adenocarcinoma with new mets to the brain . -Patient is altered, lethargic, he will be kept n.p.o.    Lung adenocarcinoma, with metastasis to bone, adrenal glands, and new mets to brain. -I have discussed at length with wife at bedside, and daughter Bonita Quin by phone, at this point  family wish to proceed with hospice, no aggressive measures, no heroics - palliative  medicine consulted, their input greatly appreciated. -For now given evidence of new brain mets, I will continue with steroids(will opt for IV hydrocortisone instead of Decadron in the setting of mental insufficiency and low blood pressure), started on Keppra for seizure prophylaxis. -TOC consult placed for hospice evaluation  Diabetes mellitus, type II, uncontrolled with hyperglycemia -A1c was 7.2 in February of this year -Will start on lower dose Toujeo given he is n.p.o. due to his encephalopathy, and will keep an insulin sliding scale   Chronic kidney disease, stage 3a (HCC) -Renal function at baseline  Essential hypertension -Blood pressure is soft, will hold meds due to that, as well given his encephalopathy, if started to increase will start on as needed hydralazine    Mixed diabetic hyperlipidemia associated with type 2 diabetes mellitus (HCC) -Statin remains on hold given his encephalopathy   GERD without esophagitis -Resume on Protonix IV.  Hypokalemia - Repleted IV  Anemia of chronic kidney disease -Globin of 7.5, around baseline, no indication for transfusion   Goals of care: -I have discussed with wife at bedside, and daughter Bonita Quin by phone, overall patient with progressive decline, very poor life quality, significant deconditioning, at this point patient is DNR, no heroics, no escalation for care, and family want to consider hospice, palliative medicine has been consulted, and TOC consult has been placed for referral to hospice.  DVT Prophylaxis Heparin  AM Labs Ordered, also please review Full Orders  Family Communication: Admission, patients condition and plan of care including tests being ordered have been discussed with the patient and wife at bedside, and duahgter Bonita Quin by phone  who indicate understanding and agree with the plan and Code Status.  Code Status DNR  Likely DC  to  likely hospice,  home vs residential  Condition GUARDED    Consults called: palliaitve, D/W Dr Caren Hazykattragada by phone    Admission status: inpatient    Time spent in minutes : 70 minutes   Huey Bienenstockawood Anmol Fleck M.D on 12/24/2022 at 4:38 PM   [facility] - Office  [phone]

## 2023-01-01 NOTE — Progress Notes (Signed)
[facility] CSW Progress Note  Clinical Social Worker  spoke with pt's daughter regarding home care services.  Pt reportedly had home care through [facility] when previously discharged from the hospital; however, pt did not accept PT services at the time and was not consistently allowing home care into the home.  Pt's daughter requesting home care services be restarted w/ her listed as the contact to assist w/ ensuring services are accepted and utilized.  RN informed and request sent; however, pt later sent by EMS to the hospital from PCP's office.  Care team to continue to follow after discharge from hospital.  CSW to remain available as appropriate throughout duration of treatment.        Rachel Moulds, LCSW

## 2023-01-01 NOTE — ED Triage Notes (Signed)
Pt here sent from MD office for aloc and FTT , not been eating much and has had some abd pain , being treated for cancer and is on 3 liter otc

## 2023-01-01 NOTE — Consult Note (Signed)
Consultation Note Date: 01/09/2023   Patient Name: [name]  DOB: Feb 12, 1935  MRN: [id number]  Age / Sex: 88 y.o., male  PCP: Brandon Stabile, MD Referring Physician: Gerhard Munch, MD  Reason for Consultation:  "palliative"  HPI/Patient Profile: 88 y.o. male  with past medical history of B12 deficiency, mantle cell lymphoma s/p chemotherapy, colon cancer, CAD, HTN, GERD, HLD, OSA, Prostate cancer- s/p prostatectomy and radiation therapy, artificial bladder sphincter with recurrent UTI's, DM2, PVD, and recently diagnosed poorly differentiated adenocarcinoma of the lung with mets to bones (L femur, T3 transverse process, several L ribs, L scapula at shoulder joint) and adrenals s/p one treatment of Ipilimumab and nivolumab on 12/19/2022 admitted on 12/24/2022 with increasing confusion and lethargy. Workup reveals new 72mm parietal lobe brain lesion on MRI.  Palliative consulted for GOC.   Primary Decision Maker NEXT OF KIN - spouse- [name]  Discussion: Chart reviewed including labs, progress notes, imaging from this and previous encounters.  Patient was sleeping. Spouse, [name] at bedside.  Prior to admission patient has had ongoing decline in functional status and nutrition.  He and [name] used to enjoy going camping together, but [name] notes that in the last year or more he hasn't been well enough to do the things that he enjoys. [name] shares that she believes they will need help in the home when he is discharged.  We discussed his acute on chronic illness and new finding of brain lesion.  Initially, [name] noted that [name] would want all aggressive medical interventions to keep patient alive. We discussed the aggressive nature of his cancer and that he will unfortunately, eventually die from it. We also discussed quality of life and what is important to patient and what the definition of living is to her and to him.   Code status was discussed. Encouraged patient/family to consider DNR/DNI status understanding evidenced based poor outcomes in similar hospitalized patients, as the cause of the arrest is likely associated with chronic/terminal disease rather than a reversible acute cardio-pulmonary event.  [name] agrees that CPR would not benefit patient's overall situation- she agrees to DNR status, otherwise full scope of treatments.     SUMMARY OF RECOMMENDATIONS -DNR- full scope -per chart review and discussion with RN patient's daughter is greatly involved with patient's care- I have called and left a message for her- will attempt to meet with daughter and spouse tomorrow together to further discuss goals of care and scope of desired medical interventions- patient would be a candidate for hospice, potentially inpatient hospice  -Oncology has been consulted- will also appreciate their input    Code Status/Advance Care Planning: DNR   Prognosis:   Unable to determine  Discharge Planning: To Be Determined  Primary Diagnoses: -Failure to thrive, adult -Metastatic lung cancer  Review of Systems  Unable to perform ROS   Physical Exam Vitals and nursing note reviewed.  Constitutional:      General: He is not in acute distress.    Comments: Sleeping  Cardiovascular:  Comments: Bradycardia at times Pulmonary:     Effort: Pulmonary effort is normal.  Neurological:     Comments: Sleeping- per report some confusion at baseline     Vital Signs: BP 121/62   Pulse (!) 107   Temp 98.1 F (36.7 C) (Oral)   Resp (!) 26   SpO2 96%      Pain Score: 6    SpO2: SpO2: 96 % O2 Device:SpO2: 96 % O2 Flow Rate: .O2 Flow Rate (L/min): 3 L/min  IO: Intake/output summary: No intake or output data in the 24 hours ending 01/21/2023 1625  LBM:   Baseline Weight:   Most recent weight:         Thank you for this consult. Palliative medicine will continue to follow and assist as needed.   Greater  than 50%  of this time was spent counseling and coordinating care related to the above assessment and plan.  Signed by: Ocie Bob, AGNP-C Palliative Medicine    Please contact Palliative Medicine Team phone at [phone] for questions and concerns.  For individual provider: See Loretha Stapler

## 2023-01-01 NOTE — ED Provider Notes (Signed)
[place] EMERGENCY DEPARTMENT AT [facility] Provider Note   CSN: [id number] Arrival date & time: 12/23/2022  1240     History  Chief Complaint  Patient presents with   Failure To Thrive    [name] is a 88 y.o. male.  HPI Elderly male presents with his daughter provides much of the history. Level 5 caveat secondary to acuity of condition. In essence this patient has metastatic lung cancer with known mets to multiple soft tissue and bone areas. He had 1 course of immunotherapy, was scheduled for another tomorrow.  However, over the past day the patient has become hypersomnolent, withdrawn, with less verbal interactivity than usual.  No fall, trauma, fever.    Home Medications Prior to Admission medications   Medication Sig Start Date End Date Taking? Authorizing Provider  furosemide (LASIX) 40 MG tablet Take 1 tablet (40 mg total) by mouth as needed for fluid. 12/30/22   Doreatha Massed, MD  acetaminophen (TYLENOL) 325 MG tablet Take 650 mg by mouth every 6 (six) hours as needed for mild pain or moderate pain.    [provider]  albuterol (PROAIR HFA) 108 (90 Base) MCG/ACT inhaler Inhale 2 puffs into the lungs at bedtime as needed for wheezing or shortness of breath. 05/12/20   Nyoka Cowden, MD  amLODipine (NORVASC) 10 MG tablet Take 1 tablet (10 mg total) by mouth daily. Patient taking differently: Take 5 mg by mouth daily. 01/11/22   Tereso Newcomer T, PA-C  aspirin EC 81 MG tablet Take 81 mg by mouth daily. 04/29/22   [provider]  atorvastatin (LIPITOR) 40 MG tablet Take 40 mg by mouth daily. 10/05/21   [provider]  blood glucose meter kit and supplies KIT Dispense based on patient and insurance preference. Use at least three times a day to check BS levels and fluctuation. 09/04/21   Vassie Loll, MD  budesonide-formoterol [facility]) 160-4.5 MCG/ACT inhaler Inhale 2 puffs into the lungs 2 (two) times daily. 05/12/20   Nyoka Cowden, MD  dimenhyDRINATE (DRAMAMINE) 50 MG tablet Take 50 mg by mouth every 8 (eight) hours as needed for nausea.    [provider]  folic acid (FOLVITE) 1 MG tablet Take 1 tablet (1 mg total) by mouth daily. 11/13/22   Doreatha Massed, MD  HYDROmorphone (DILAUDID) 2 MG tablet Take 1 tablet (2 mg total) by mouth every 6 (six) hours as needed. 12/18/22   Doreatha Massed, MD  insulin lispro (HUMALOG KWIKPEN) 100 UNIT/ML KwikPen Inject 10 Units into the skin 3 (three) times daily. 4-8 units 12/03/22   Shalhoub, Deno Lunger, MD  Ipilimumab (YERVOY IV) Inject into the vein. 12/19/22   [provider]  lidocaine-prilocaine (EMLA) cream Apply to affected area once Patient not taking: Reported on 12/23/2022 12/17/22   Doreatha Massed, MD  magnesium oxide (MAG-OX) 400 (240 Mg) MG tablet Take 1 tablet (400 mg total) by mouth daily. 12/23/22   Doreatha Massed, MD  meclizine (ANTIVERT) 25 MG tablet Take 1 tablet (25 mg total) by mouth 3 (three) times daily as needed for dizziness. 10/15/21   Erick Blinks, MD  megestrol (MEGACE) 400 MG/10ML suspension Take 10 mLs (400 mg total) by mouth 2 (two) times daily. 11/25/22   Doreatha Massed, MD  metoCLOPramide (REGLAN) 10 MG tablet Take 1 tablet (10 mg total) by mouth every 6 (six) hours as needed for nausea. 11/25/22   Doreatha Massed, MD  Nivolumab (OPDIVO IV) Inject into the vein. 12/20/22  [provider]  nystatin (MYCOSTATIN/NYSTOP) powder Apply topically. 09/24/22   [provider]  pantoprazole (PROTONIX) 40 MG tablet Take 40 mg by mouth 2 (two) times daily. 11/08/22   [provider]  testosterone cypionate (DEPOTESTOSTERONE CYPIONATE) 200 MG/ML injection Inject 200 mg into the muscle every 14 (fourteen) days.  04/29/16   [provider]  TOUJEO SOLOSTAR 300 UNIT/ML Solostar Pen Inject 40 Units into the skin daily. 12/03/22   Shalhoub, Deno Lunger, MD  traZODone (DESYREL) 50 MG tablet Take 1  tablet (50 mg total) by mouth at bedtime as needed for sleep. 12/26/22   Doreatha Massed, MD      Allergies    Bee venom, Iodine, Adhesive [tape], Ciprofloxacin, Povidone-iodine, Simvastatin, Codeine, and Latex    Review of Systems   Review of Systems  Unable to perform ROS: Acuity of condition    Physical Exam Updated Vital Signs BP 121/62   Pulse (!) 107   Temp 98.1 F (36.7 C) (Oral)   Resp (!) 26   SpO2 96%  Physical Exam Vitals and nursing note reviewed.  Constitutional:      General: He is not in acute distress.    Appearance: He is well-developed. He is ill-appearing.  HENT:     Head: Normocephalic and atraumatic.  Eyes:     Conjunctiva/sclera: Conjunctivae normal.  Cardiovascular:     Rate and Rhythm: Regular rhythm. Tachycardia present.  Pulmonary:     Effort: Pulmonary effort is normal. Tachypnea present.  Abdominal:     General: There is no distension.  Skin:    General: Skin is warm and dry.  Neurological:     Motor: No tremor or atrophy.     Comments: Sleeping, awakens to painful stimuli, MAES  Psychiatric:        Behavior: Behavior is withdrawn.        Cognition and Memory: Cognition is impaired. Memory is impaired.     ED Results / Procedures / Treatments   Labs (all labs ordered are listed, but only abnormal results are displayed) Labs Reviewed  COMPREHENSIVE METABOLIC PANEL - Abnormal; Notable for the following components:      Result Value   Sodium 134 (*)    Potassium 3.3 (*)    Glucose, Bld 275 (*)    BUN 49 (*)    Creatinine, Ser 1.44 (*)    Total Protein 6.2 (*)    Albumin 2.7 (*)    GFR, Estimated 48 (*)    All other components within normal limits  CBC WITH DIFFERENTIAL/PLATELET - Abnormal; Notable for the following components:   RBC 2.59 (*)    Hemoglobin 7.5 (*)    HCT 24.0 (*)    RDW 18.5 (*)    Lymphs Abs 0.6 (*)    All other components within normal limits  BRAIN NATRIURETIC PEPTIDE - Abnormal; Notable for the  following components:   B Natriuretic Peptide 279.0 (*)    All other components within normal limits  LACTIC ACID, PLASMA  MAGNESIUM    EKG EKG Interpretation  Date/Time:  Wednesday January 01 2023 13:27:35 EDT Ventricular Rate:  112 PR Interval:  51 QRS Duration: 86 QT Interval:  326 QTC Calculation: 415 R Axis:   -13 Text Interpretation: Sinus tachycardia Multiple premature complexes, vent & supraven Short PR interval Low voltage, extremity leads Artifact Abnormal ECG Confirmed by Gerhard Munch [phone]) on 01/10/2023 2:08:00 PM  Radiology MR BRAIN W WO CONTRAST  Result Date: 01/21/2023 CLINICAL DATA:  Mental  status change, unknown cause Tech note: History of colon AND prostate cancer. EXAM: MRI HEAD WITHOUT AND WITH CONTRAST TECHNIQUE: Multiplanar, multiecho pulse sequences of the brain and surrounding structures were obtained without and with intravenous contrast. CONTRAST:  10mL GADAVIST GADOBUTROL 1 MMOL/ML IV SOLN COMPARISON:  MRI October 28, 2022. FINDINGS: Brain: Approximately 8 mm peripherally enhancing lesion in the high right parietal lobe, compatible with a metastasis. Surrounding edema without significant mass effect. No evidence of acute infarct, acute hemorrhage, midline shift or hydrocephalus. Vascular: Major arterial flow voids are maintained. Skull and upper cervical spine: Enhancing left sphenoid bone lesion. Sinuses/Orbits: Paranasal sinus mucosal thickening. No acute orbital findings. Other: No mastoid effusions. IMPRESSION: 1. Approximately 8 mm peripherally enhancing lesion in the high right parietal lobe, compatible with a metastasis. Surrounding edema without significant mass effect. 2. Enhancing left sphenoid bone lesion, suspicious for osseous metastasis. Electronically Signed   By: Feliberto HartsFrederick S Jones M.D.   On: 01/03/2023 14:29   DG Chest Port 1 View  Result Date: 01/03/2023 CLINICAL DATA:  LOC EXAM: PORTABLE CHEST - 1 VIEW COMPARISON:  12/21/2022 FINDINGS: Cardiac  silhouette is unremarkable. No pneumothorax or pleural effusion. Mild vascular congestion without focal consolidation. The visualized skeletal structures are unremarkable. IMPRESSION: Vascular congestion without focal consolidation. Electronically Signed   By: Layla MawJoshua  Pleasure M.D.   On: 12/31/2022 13:33    Procedures Procedures    Medications Ordered in ED Medications  0.9 %  sodium chloride infusion ( Intravenous New Bag/Given 01/02/2023 1424)  gadobutrol (GADAVIST) 1 MMOL/ML injection 10 mL (10 mLs Intravenous Contrast Given 01/19/2023 1359)    ED Course/ Medical Decision Making/ A&P                             Medical Decision Making Adult male presents with altered mental status in the context of metastatic cancer.  Patient is tachycardic, but not febrile nor hypotensive.  No early evidence for bacteremia, sepsis though pneumonia given his immobile status is a consideration. Suspicion for progression of disease versus electrolyte abnormalities versus heart failure exacerbation contributing to his presentation. Cardiac 105 sinus tach abnormal Pulse ox 96% with nasal cannula per baseline abnormal MRI labs x-ray ordered.  Amount and/or Complexity of Data Reviewed Independent Historian: caregiver External Data Reviewed: notes. Labs: ordered. Decision-making details documented in ED Course. Radiology: ordered and independent interpretation performed. Decision-making details documented in ED Course. ECG/medicine tests: ordered and independent interpretation performed. Decision-making details documented in ED Course.  Risk Prescription drug management. Decision regarding hospitalization.   3:43 PM I have reviewed the patient's MRI, labs, the latter consistent with prior the former notable for new brain lesion consistent with metastatic disease.  Now on repeat exam the patient is awake and alert, though he falls asleep after being informed of his results.  Discussed results with the  patient and daughter, she is amenable to admission, palliative consult, and have paged the patient's oncologist to make him aware of the patient's new findings.  Given new change in mental status, functional status, likely due to progression of disease including evidence for new brain metastatic lesion patient will be admitted for further monitoring, management.        Final Clinical Impression(s) / ED Diagnoses Final diagnoses:  Lung cancer metastatic to brain  Acute encephalopathy    CRITICAL CARE Performed by: Gerhard Munchobert Jaqualin Serpa Total critical care time: 35 minutes Critical care time was exclusive of separately billable procedures and treating other  patients. Critical care was necessary to treat or prevent imminent or life-threatening deterioration. Critical care was time spent personally by me on the following activities: development of treatment plan with patient and/or surrogate as well as nursing, discussions with consultants, evaluation of patient's response to treatment, examination of patient, obtaining history from patient or surrogate, ordering and performing treatments and interventions, ordering and review of laboratory studies, ordering and review of radiographic studies, pulse oximetry and re-evaluation of patient's condition.    Gerhard Munch, MD 10-Jan-2023 [phone]

## 2023-01-02 ENCOUNTER — Other Ambulatory Visit: Payer: Medicare Other

## 2023-01-02 ENCOUNTER — Ambulatory Visit: Payer: Medicare Other | Admitting: Hematology

## 2023-01-02 ENCOUNTER — Ambulatory Visit: Payer: Medicare Other

## 2023-01-02 DIAGNOSIS — R6251 Failure to thrive (child): Secondary | ICD-10-CM | POA: Diagnosis not present

## 2023-01-02 DIAGNOSIS — I2510 Atherosclerotic heart disease of native coronary artery without angina pectoris: Secondary | ICD-10-CM

## 2023-01-02 DIAGNOSIS — C7951 Secondary malignant neoplasm of bone: Secondary | ICD-10-CM

## 2023-01-02 DIAGNOSIS — G9389 Other specified disorders of brain: Secondary | ICD-10-CM | POA: Diagnosis not present

## 2023-01-02 DIAGNOSIS — Z515 Encounter for palliative care: Secondary | ICD-10-CM | POA: Diagnosis not present

## 2023-01-02 DIAGNOSIS — I10 Essential (primary) hypertension: Secondary | ICD-10-CM

## 2023-01-02 DIAGNOSIS — R627 Adult failure to thrive: Secondary | ICD-10-CM | POA: Diagnosis present

## 2023-01-02 DIAGNOSIS — C3492 Malignant neoplasm of unspecified part of left bronchus or lung: Secondary | ICD-10-CM | POA: Diagnosis not present

## 2023-01-02 LAB — GLUCOSE, CAPILLARY
Glucose-Capillary: 289 mg/dL — ABNORMAL HIGH (ref 70–99)
Glucose-Capillary: 281 mg/dL — ABNORMAL HIGH (ref 70–99)
Glucose-Capillary: 352 mg/dL — ABNORMAL HIGH (ref 70–99)

## 2023-01-02 MED ORDER — ONDANSETRON 4 MG PO TBDP: 4.0000 mg | ORAL_TABLET | Freq: Four times a day (QID) | ORAL | Status: DC | PRN

## 2023-01-02 MED ORDER — GLYCOPYRROLATE 0.2 MG/ML IJ SOLN: 0.2000 mg | INTRAMUSCULAR | Status: DC | PRN

## 2023-01-02 MED ORDER — LORAZEPAM 1 MG PO TABS
1.0000 mg | ORAL_TABLET | ORAL | Status: DC | PRN
  Administered 2023-01-02: 1 mg via ORAL
  Filled 2023-01-02: qty 1

## 2023-01-02 MED ORDER — HYDROMORPHONE HCL 1 MG/ML IJ SOLN
0.5000 mg | Freq: Four times a day (QID) | INTRAMUSCULAR | Status: DC
  Administered 2023-01-02 – 2023-01-05 (×11): 0.5 mg via INTRAVENOUS
  Filled 2023-01-02 (×13): qty 0.5

## 2023-01-02 MED ORDER — LORAZEPAM 2 MG/ML IJ SOLN
1.0000 mg | INTRAMUSCULAR | Status: DC | PRN
  Administered 2023-01-03 – 2023-01-05 (×4): 1 mg via INTRAVENOUS
  Filled 2023-01-02 (×5): qty 1

## 2023-01-02 MED ORDER — HALOPERIDOL 0.5 MG PO TABS
0.5000 mg | ORAL_TABLET | ORAL | Status: DC | PRN
  Administered 2023-01-02: 0.5 mg via ORAL
  Filled 2023-01-02: qty 1

## 2023-01-02 MED ORDER — HYDROCORTISONE SOD SUC (PF) 100 MG IJ SOLR: 50.0000 mg | Freq: Three times a day (TID) | INTRAMUSCULAR | Status: DC

## 2023-01-02 MED ORDER — LORAZEPAM 2 MG/ML PO CONC: 1.0000 mg | ORAL | Status: DC | PRN

## 2023-01-02 MED ORDER — GLYCOPYRROLATE 1 MG PO TABS: 1.0000 mg | ORAL_TABLET | ORAL | Status: DC | PRN

## 2023-01-02 MED ORDER — GLYCOPYRROLATE 0.2 MG/ML IJ SOLN
0.2000 mg | INTRAMUSCULAR | Status: DC | PRN
  Administered 2023-01-04 – 2023-01-05 (×2): 0.2 mg via INTRAVENOUS
  Filled 2023-01-02 (×2): qty 1

## 2023-01-02 MED ORDER — HALOPERIDOL LACTATE 5 MG/ML IJ SOLN
0.5000 mg | INTRAMUSCULAR | Status: DC | PRN
  Administered 2023-01-03: 0.5 mg via INTRAVENOUS
  Filled 2023-01-02: qty 1

## 2023-01-02 MED ORDER — HYDROMORPHONE HCL 1 MG/ML IJ SOLN
0.5000 mg | INTRAMUSCULAR | Status: DC | PRN
  Administered 2023-01-02 – 2023-01-05 (×7): 0.5 mg via INTRAVENOUS
  Filled 2023-01-02 (×6): qty 0.5

## 2023-01-02 MED ORDER — INSULIN ASPART 100 UNIT/ML IJ SOLN
0.0000 [IU] | INTRAMUSCULAR | Status: DC
  Administered 2023-01-02: 8 [IU] via SUBCUTANEOUS

## 2023-01-02 MED ORDER — HALOPERIDOL LACTATE 2 MG/ML PO CONC: 0.5000 mg | ORAL | Status: DC | PRN

## 2023-01-02 MED ORDER — MORPHINE SULFATE (PF) 2 MG/ML IV SOLN: 2.0000 mg | INTRAVENOUS | Status: DC

## 2023-01-02 MED ORDER — ONDANSETRON HCL 4 MG/2ML IJ SOLN: 4.0000 mg | Freq: Four times a day (QID) | INTRAMUSCULAR | Status: DC | PRN

## 2023-01-02 MED ORDER — POLYVINYL ALCOHOL 1.4 % OP SOLN: 1.0000 [drp] | Freq: Four times a day (QID) | OPHTHALMIC | Status: DC | PRN

## 2023-01-02 MED ORDER — MORPHINE SULFATE (PF) 2 MG/ML IV SOLN
2.0000 mg | INTRAVENOUS | Status: DC | PRN
  Administered 2023-01-02 (×2): 2 mg via INTRAVENOUS
  Filled 2023-01-02 (×2): qty 1

## 2023-01-02 MED ORDER — INSULIN ASPART 100 UNIT/ML IJ SOLN
0.0000 [IU] | Freq: Three times a day (TID) | INTRAMUSCULAR | Status: DC
  Administered 2023-01-02: 15 [IU] via SUBCUTANEOUS
  Administered 2023-01-03: 5 [IU] via SUBCUTANEOUS
  Administered 2023-01-03: 3 [IU] via SUBCUTANEOUS
  Administered 2023-01-04 (×2): 2 [IU] via SUBCUTANEOUS
  Administered 2023-01-05 (×2): 5 [IU] via SUBCUTANEOUS

## 2023-01-02 MED ORDER — MORPHINE SULFATE (PF) 2 MG/ML IV SOLN: 2.0000 mg | Freq: Four times a day (QID) | INTRAVENOUS | Status: DC

## 2023-01-02 NOTE — Progress Notes (Signed)
Daily Progress Note   Patient Name: [name]       Date: 01/02/2023 DOB: 11/20/1934  Age: 88 y.o. MRN#: [id number] Attending Physician: Cleora Fleet, MD Primary Care Physician: Benita Stabile, MD Admit Date: 12/29/2022  Reason for Consultation/Follow-up: Establishing goals of care  Patient Profile/HPI:  88 y.o. male  with past medical history of B12 deficiency, mantle cell lymphoma s/p chemotherapy, colon cancer, CAD, HTN, GERD, HLD, OSA, Prostate cancer- s/p prostatectomy and radiation therapy, artificial bladder sphincter with recurrent UTI's, DM2, PVD, and recently diagnosed poorly differentiated adenocarcinoma of the lung with mets to bones (L femur, T3 transverse process, several L ribs, L scapula at shoulder joint) and adrenals s/p one treatment of Ipilimumab and nivolumab on 12/19/2022 admitted on 01/16/2023 with increasing confusion and lethargy. Workup reveals new brain lesion on MRI, dehydration, acute kidney injury. Palliative consulted for GOC.   Subjective: Chart reviewed including labs, progress notes, imaging from this and previous encounters.  Met with patient, his daughter and son at bedside.  Patient is awake and alert today. However, has significant cognitive impairment. Does not recall any events from yesterday. Cannot communicate his health history to me. He is oriented to person and place. I attempted GOC discussion with him. Reviewed his current diagnosis and options for care- ie continued aggressive medical interventions vs comfort measures/hospice. Discussed transition to comfort measures only which includes stopping IV fluids, antibiotics, labs and providing symptom management for SOB, anxiety, nausea, vomiting, and other symptoms of dying.  However, he was without  capacity for medical decision making due to inability to clearly state preferred treatment options for medical care, inability to recall information regarding his cancer and treatment options, unable to identify treatment options and potential outcomes and unable to weigh risks and benefits and come to a conclusion congruent with perceived goals.  Plan of care was further discussed with daughter and son.  Patient's functional status has been on significant decline since his cancer treatment. He has stopped eating and drinking, he has lost the ability to walk. Has had changes in memory, periods of confusion and agitation. He is incontinent of urine.  Spoke with spouse via phone and she gave permission for daughter to be main point of contact and Management consultant.  GOC were delineated for comfort measures only, ensuring patient  is without pain, anxiety, and is competently cared for.  Discussed hospice services and philosophy of care. They are interested in seeking inpatient hospice placement if patient qualifies.   Review of Systems  Constitutional:  Positive for malaise/fatigue.  Musculoskeletal:  Positive for myalgias.       Pain "all over"  Psychiatric/Behavioral:  The patient is nervous/anxious.      Physical Exam Vitals and nursing note reviewed.  Constitutional:      General: He is not in acute distress. Cardiovascular:     Comments: Intermittent bradycardia on monitor Pulmonary:     Effort: Pulmonary effort is normal.  Neurological:     Mental Status: He is alert. He is disoriented.             Vital Signs: BP 105/72 (BP Location: Right Arm)   Pulse 92   Temp 98.4 F (36.9 C)   Resp (!) 24   Ht 5\' 11"  (1.803 m)   Wt 100.7 kg   SpO2 99%   BMI 30.96 kg/m  SpO2: SpO2: 99 % O2 Device: O2 Device: Nasal Cannula O2 Flow Rate: O2 Flow Rate (L/min): 3 L/min  Intake/output summary:  Intake/Output Summary (Last 24 hours) at 01/02/2023 1256 Last data filed at 01/02/2023 0617 Gross  per 24 hour  Intake 1208.07 ml  Output 300 ml  Net 908.07 ml   LBM:   Baseline Weight: Weight: 100.7 kg Most recent weight: Weight: 100.7 kg       Palliative Assessment/Data: PPS: 20%      Patient Active Problem List   Diagnosis Date Noted   Failure to thrive (child) 01/08/2023   Chronic kidney disease, stage 3a 12/01/2022   Sepsis due to Klebsiella 12/01/2022   Mixed diabetic hyperlipidemia associated with type 2 diabetes mellitus 12/01/2022   Sepsis 11/30/2022   Primary lung adenocarcinoma, left 11/13/2022   Mass of lower lobe of left lung 10/28/2022   Mediastinal adenopathy 10/28/2022   Cough with hemoptysis 10/26/2022   Hypokalemia 10/26/2022   Preoperative cardiovascular examination 04/11/2022   Dizziness 10/22/2021   Aortic atherosclerosis 10/20/2021   Carotid artery plaque 10/17/2021   Cervical radiculopathy 10/15/2021   Altered mental status    Chronic renal failure, stage 3b    Acute metabolic encephalopathy 09/03/2021   Acute pyelonephritis 08/30/2021   Sepsis secondary to UTI 08/28/2021   Hyperglycemia 08/28/2021   Obesity (BMI 30.0-34.9) 08/28/2021   Tremors of nervous system 08/28/2021   Insomnia 08/28/2021   Complicated UTI (urinary tract infection) 08/27/2021   COVID-19 virus infection 06/04/2021   Imbalance 05/06/2021   Hydronephrosis 05/06/2021   Acute respiratory failure with hypoxia 05/06/2021   Asthma 05/06/2021   Uncontrolled type 2 diabetes mellitus with hyperglycemia, with long-term current use of insulin    Proteinuria 03/14/2021   Diabetic peripheral neuropathy 03/07/2021   Asymptomatic cholelithiasis 07/06/2019   Peripheral polyneuropathy 01/16/2019   Right lower lobe lung mass 08/01/2016   Upper airway cough syndrome 07/30/2016   Chronic asthma vs UACS  07/11/2016   Dupuytren's contracture 06/06/2016   Mild cognitive impairment 01/22/2016   Essential tremor 10/17/2015   Acute renal failure superimposed on stage 3b chronic kidney  disease 07/02/2015   AKI (acute kidney injury) 06/30/2015   Acute kidney injury 06/30/2015   Status post total right knee replacement 03/29/2014   B12 deficiency 02/07/2014   Diverticulosis of colon with hemorrhage 01/01/2014   Thrombocytopenia, unspecified 01/01/2014   Acute blood loss anemia 01/01/2014   Leukopenia 12/31/2013   Lower GI  bleed 12/30/2013   Diverticulitis 12/30/2013   Abdominal pain, left lower quadrant 11/16/2013   Abdominal pain, unspecified site 11/16/2013   Mantle cell lymphoma 09/07/2013   Prostate cancer 09/07/2013   Proctitis, 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   H/O ulcerative colitis 04/12/2013   OSA (obstructive sleep apnea) 08/15/2012   VASOMOTOR RHINITIS 01/23/2010   GERD without esophagitis 01/23/2010   SOB (shortness of breath) 01/01/2010   Essential hypertension 11/23/2009   CAD (coronary artery disease) 02/18/2009    Palliative Care Assessment & Plan    Assessment/Recommendations/Plan  DNR- comfort measures only TOC referral for inpatient hospice evaluation Transition to full comfort measures 2mg  morphine IV q6hrs; 2mg  morphine IV q2 hrs prn Lorazepam 1mg  po, SL or IV q4 hr anxiety Haldol .5mg  po, SL or IV q4 hr PRN agitation Glycopyrrolate .2mg  IV q4hr secretions D/C steroids- per daughter he is having increased agitation- steroids could be exacerbating Continue keppra until discharge for seizuire prophylaxis Continue CBG's and insulin per family request and comfort- patient agitated when CBGs are too high   Code Status: DNR  Prognosis:  < 4 weeks  Discharge Planning: Hospice facility pending evaluation and acceptance  Care plan was discussed with family and care team  Thank you for allowing the Palliative Medicine Team to assist in the care of this patient.  Total time: 120 minutes  Greater than 50%  of this time was spent counseling and coordinating care related to the above assessment and plan.  Ocie BobKasie Dasani Thurlow, AGNP-C Palliative  Medicine   Please contact Palliative Medicine Team phone at [phone] for questions and concerns.

## 2023-01-02 NOTE — Progress Notes (Signed)
Palliative-   Returned to check on patient. No family at bedside. He did not remember me or any of our previous discussion.  He complained of pain all over.   Plan:  -Change IV morphine to IV hydromorphone- patient was taking oral hydromorphone at home-   -0.5mg  hydromorphone IV q6hr; 0.5mg  hydromorphone q2hr IV prn -Change CBG and sliding scale to TID WC  Ocie Bob, AGNP-C Palliative Medicine  No charge

## 2023-01-02 NOTE — TOC Progression Note (Addendum)
Transition of Care [facility]) - Progression Note    Patient Details  Name: [name] MRN: [id number] Date of Birth: 29-Jul-1935  [facility] CM/SW Contact  Leitha Bleak, RN Phone Number: 01/02/2023, 2:10 PM  Clinical Narrative:   Palliative consulted TOC for residential hospice referral with [facility]. Referral sent in the hub. TOC left Amber a message,  waiting for ETA for assessment. TOC following.   Addendum :  May assess tomorrow for GIP, not beds until possibly Saturday.   Addendum #2 -  Amber with [facility] asking for resend referral. CM sent.   Expected Discharge Plan: Hospice Medical Facility Barriers to Discharge: Continued Medical Work up  Expected Discharge Plan and Services    Hospice Social Determinants of Health (SDOH) Interventions SDOH Screenings   Food Insecurity: No Food Insecurity (01/01/2023)  Housing: Low Risk  (01/01/2023)  Transportation Needs: No Transportation Needs (01/01/2023)  Utilities: Not At Risk (01/01/2023)  Tobacco Use: Low Risk  (01/01/2023)    Readmission Risk Interventions    08/30/2021   11:52 AM 06/12/2021   12:39 PM 06/12/2021   10:43 AM  Readmission Risk Prevention Plan  Transportation Screening Complete Complete   PCP or Specialist Appt within 3-5 Days Complete  Complete  HRI or Home Care Consult Complete  Complete  Social Work Consult for Recovery Care Planning/Counseling Complete  Complete  Palliative Care Screening Not Applicable  Not Applicable  Medication Review Oceanographer) Complete  Complete

## 2023-01-02 NOTE — Progress Notes (Signed)
PROGRESS NOTE   [name]  ZOX:[id number] DOB: 1934/12/16 DOA: 12/30/2022 PCP: Benita StabileHall, John Z, MD   Chief Complaint  Patient presents with   Failure To Thrive   Level of care: Med-Surg  Brief Admission History:   88 y.o. male, e with extensive cancer history significant for mantle cell lymphoma (Dx 08/2013 S/P chemotherapy, follows with Dr. Ellin SabaKatragadda), prostate cancer (S/P laparoscopic prostatectomy 02/2008 followed by radiation therapy), and recent diagnosis of poorly differentiated adenocarcinoma of the lung 10/2022 metastatic to bone  Patient additionally has a history of hypertension, OSA, B12 deficiency, history of CAD, GERD, CKD stage IIIa, hyperlipidemia, peripheral vascular disease, insulin-dependent type 2 diabetes, and [facility] (follow[facility] with [facility] Rudell [facility]Policastro [facility]).    -Patient is altered, most of the history was obtained by wife at bedside, and daughter by phone, patient had PCP visit today, where he was noted to be somnolent, withdrawn, so he was sent by by his PCP for further evaluation, patient his first course of immunotherapy, he was scheduled for an excision tomorrow, but he is somnolent, with progressive weakness, very poor intake and appetite, no fall, no fever, no chills -In ED his workup was significant for low potassium at 3.3, hyponatremia at 134, baseline creatinine of 1.4, known anemia with hemoglobin of 7.5, MRI brain was obtained which was unfortunately significant for new brain metastasis 8 mm brain metastasis with surrounding edema, [facility] hospitalist consulted to admit.   Assessment and Plan:  Lung adenocarcinoma, with metastasis to bone, adrenal glands, and new mets to brain. -discussed at length with wife at bedside, and daughter Bonita QuinLinda, at this point family wishes to proceed with hospice, no aggressive measures, no heroics - palliative  medicine consulted, their input greatly appreciated. - transition to  full comfort measures and request for inpatient hospice referral  Diabetes mellitus, type II, uncontrolled with steroid induced hyperglycemia -A1c was 7.2 in February of this year -he is being covered with sliding scale insulin  -now transitioning to full comfort measures  Chronic kidney disease, stage 3a  -Renal function at baseline   Essential hypertension -Blood pressure is soft, will hold meds due to that, as well given his encephalopathy, if started to increase will start on as needed hydralazine     Mixed diabetic hyperlipidemia associated with type 2 diabetes mellitus  -Statin on hold given his encephalopathy  -now transitioning to full comfort measures   GERD without esophagitis -now transitioning to full comfort measures   Hypokalemia - Repleted    Anemia of chronic kidney disease -now transitioning to full comfort measures   Goals of care: -now transitioning to full comfort measures  DVT prophylaxis: full comfort measures  Code Status: DNR  Family Communication: bedside 4/11    Consultants:  Palliative care service   Procedures:   Antimicrobials:    Subjective: Pt unable to verbalize basic needs at this time.  Objective: Vitals:   12/29/2022 2342 01/02/23 0513 01/02/23 0834 01/02/23 0839  BP: 128/83 107/67  105/72  Pulse: (!) 106 95  92  Resp: 20 (!) 24    Temp: 97.8 F (36.6 C) 98.4 F (36.9 C)    TempSrc:      SpO2: 100% 95% 96% 99%  Weight:      Height:        Intake/Output Summary (Last 24 hours) at 01/02/2023 1320 Last data filed at 01/02/2023 0617 Gross per 24 hour  Intake 1208.07 ml  Output 300 ml  Net  908.07 ml   Filed Weights   01/12/2023 2040  Weight: 100.7 kg   Examination:  General exam: pt appears somnolent, nonverbal; not acutely distressed appearing.  Respiratory system: no increased work of breathing noticed. Cardiovascular system: normal S1 & S2 heard. No JVD, murmurs, rubs, gallops or clicks. No pedal  edema. Gastrointestinal system: Abdomen is nondistended, soft and nontender. No organomegaly or masses felt. Normal bowel sounds heard. Central nervous system: somnolent. Moving extremities spontaneously. Extremities: no gross edema. Skin: age related changes only. Psychiatry: Judgement and insight appear UTD. Mood & affect UTD.   Data Reviewed: I have personally reviewed following labs and imaging studies  CBC: Recent Labs  Lab 12/25/2022 1316  WBC 5.3  NEUTROABS 4.1  HGB 7.5*  HCT 24.0*  MCV 92.7  PLT 155    Basic Metabolic Panel: Recent Labs  Lab 01/16/2023 1316  NA 134*  K 3.3*  CL 98  CO2 24  GLUCOSE 275*  BUN 49*  CREATININE 1.44*  CALCIUM 9.0  MG 1.7    CBG: Recent Labs  Lab 01/04/2023 2217 01/02/23 0734 01/02/23 1130  GLUCAP 285* 289* 281*    No results found for this or any previous visit (from the past 240 hour(s)).   Radiology Studies: MR BRAIN W WO CONTRAST  Result Date: 01/02/2023 CLINICAL DATA:  Mental status change, unknown cause Tech note: History of colon AND prostate cancer. EXAM: MRI HEAD WITHOUT AND WITH CONTRAST TECHNIQUE: Multiplanar, multiecho pulse sequences of the brain and surrounding structures were obtained without and with intravenous contrast. CONTRAST:  41mL GADAVIST GADOBUTROL 1 MMOL/ML IV SOLN COMPARISON:  MRI October 28, 2022. FINDINGS: Brain: Approximately 8 mm peripherally enhancing lesion in the high right parietal lobe, compatible with a metastasis. Surrounding edema without significant mass effect. No evidence of acute infarct, acute hemorrhage, midline shift or hydrocephalus. Vascular: Major arterial flow voids are maintained. Skull and upper cervical spine: Enhancing left sphenoid bone lesion. Sinuses/Orbits: Paranasal sinus mucosal thickening. No acute orbital findings. Other: No mastoid effusions. IMPRESSION: 1. Approximately 8 mm peripherally enhancing lesion in the high right parietal lobe, compatible with a metastasis.  Surrounding edema without significant mass effect. 2. Enhancing left sphenoid bone lesion, suspicious for osseous metastasis. Electronically Signed   By: Feliberto Harts M.D.   On: 01/11/2023 14:29   DG Chest Port 1 View  Result Date: 12/31/2022 CLINICAL DATA:  LOC EXAM: PORTABLE CHEST - 1 VIEW COMPARISON:  12/21/2022 FINDINGS: Cardiac silhouette is unremarkable. No pneumothorax or pleural effusion. Mild vascular congestion without focal consolidation. The visualized skeletal structures are unremarkable. IMPRESSION: Vascular congestion without focal consolidation. Electronically Signed   By: Layla Maw M.D.   On: 01/14/2023 13:33    Scheduled Meds:  insulin aspart  0-15 Units Subcutaneous Q4H   insulin glargine-yfgn  20 Units Subcutaneous Daily   mometasone-formoterol  2 puff Inhalation BID    morphine injection  2 mg Intravenous Q6H   Continuous Infusions:  levETIRAcetam 500 mg (01/02/23 0600)     LOS: 1 day   Time spent: 35 mins  Allen Egerton Laural Benes, MD How to contact the [facility] Attending or Consulting provider 7A - 7P or covering provider during after hours 7P -7A, for this patient?  Check the care team in [facility] and look for a) attending/consulting TRH provider listed and b) the [facility] team listed Log into [email or web address] and use [place] universal password to access. If you do not have the password, please contact the hospital operator. Locate the [facility] provider you  are looking for under [facility] and page to a number that you can be directly reached. If you still have difficulty reaching the provider, please page the [facility] (Director on Call) for the Hospitalists listed on amion for assistance.  01/02/2023, 1:20 PM

## 2023-01-02 NOTE — Progress Notes (Signed)
Found [name] with his family around him including wife, daughter and two sons, grand-daughter. Chaplain engaged patient and family in reflection around his life, upbringing, relationships, spiritual heritage and illness story. [name] shared openly today and beautifully talked about his dying. He shared that he was ready to 'go home' and that he was at peace. He very courageously asked to speak to his son [name] and began the process of reconciling with him today. All were tearful but appropriate and leaning on each other. Chaplain will continue to visit in order to provide spiritual support and to assess for spiritual need.   Rev. Jolyn Lent, M.Div Chaplain

## 2023-01-02 NOTE — TOC Progression Note (Signed)
Transition of Care [facility]) - Progression Note    Patient Details  Name: [name] MRN: [id number] Date of Birth: Nov 14, 1934  Transition of Care [facility]) CM/SW Contact  Leitha Bleak, RN Phone Number: 01/02/2023, 11:14 AM  Clinical Narrative:   Palliative will meet with Daughter tomorrow. Also waiting on Oncology's input. Patient is on oxygen. TOC  follow for Discharge plan with possibly residential hospice.       Barriers to Discharge: Continued Medical Work up  Expected Discharge Plan and Services       Social Determinants of Health (SDOH) Interventions SDOH Screenings   Food Insecurity: No Food Insecurity (01/01/2023)  Housing: Low Risk  (01/01/2023)  Transportation Needs: No Transportation Needs (01/01/2023)  Utilities: Not At Risk (01/01/2023)  Tobacco Use: Low Risk  (01/01/2023)    Readmission Risk Interventions    08/30/2021   11:52 AM 06/12/2021   12:39 PM 06/12/2021   10:43 AM  Readmission Risk Prevention Plan  Transportation Screening Complete Complete   PCP or Specialist Appt within 3-5 Days Complete  Complete  HRI or Home Care Consult Complete  Complete  Social Work Consult for Recovery Care Planning/Counseling Complete  Complete  Palliative Care Screening Not Applicable  Not Applicable  Medication Review Oceanographer) Complete  Complete

## 2023-01-02 NOTE — Hospital Course (Signed)
88 y.o. male, e with extensive cancer history significant for mantle cell lymphoma (Dx 08/2013 S/P chemotherapy, follows with Dr. Ellin Saba), prostate cancer (S/P laparoscopic prostatectomy 02/2008 followed by radiation therapy), and recent diagnosis of poorly differentiated adenocarcinoma of the lung 10/2022 metastatic to bone  Patient additionally has a history of hypertension, OSA, B12 deficiency, history of CAD, GERD, CKD stage IIIa, hyperlipidemia, peripheral vascular disease, insulin-dependent type 2 diabetes, and artificial urethral sphincter placement at [facility] 05/2022 (follows with Dr. Rudell Cobb at [facility]).    -Patient is altered, most of the history was obtained by wife at bedside, and daughter by phone, patient had PCP visit today, where he was noted to be somnolent, withdrawn, so he was sent by by his PCP for further evaluation, patient his first course of immunotherapy, he was scheduled for an excision tomorrow, but he is somnolent, with progressive weakness, very poor intake and appetite, no fall, no fever, no chills -In ED his workup was significant for low potassium at 3.3, hyponatremia at 134, baseline creatinine of 1.4, known anemia with hemoglobin of 7.5, MRI brain was obtained which was unfortunately significant for new brain metastasis 8 mm brain metastasis with surrounding edema, [facility] hospitalist consulted to admit.

## 2023-01-03 DIAGNOSIS — R627 Adult failure to thrive: Secondary | ICD-10-CM | POA: Diagnosis not present

## 2023-01-03 DIAGNOSIS — I2510 Atherosclerotic heart disease of native coronary artery without angina pectoris: Secondary | ICD-10-CM

## 2023-01-03 DIAGNOSIS — I10 Essential (primary) hypertension: Secondary | ICD-10-CM

## 2023-01-03 DIAGNOSIS — R6251 Failure to thrive (child): Secondary | ICD-10-CM | POA: Diagnosis not present

## 2023-01-03 LAB — GLUCOSE, CAPILLARY
Glucose-Capillary: 248 mg/dL — ABNORMAL HIGH (ref 70–99)
Glucose-Capillary: 240 mg/dL — ABNORMAL HIGH (ref 70–99)
Glucose-Capillary: 179 mg/dL — ABNORMAL HIGH (ref 70–99)

## 2023-01-03 NOTE — Care Management Important Message (Signed)
Important Message  Patient Details  Name: FIELDER GENGLER MRN: [id number] Date of Birth: July 18, 1935   Medicare Important Message Given:  Yes     Corey Harold 01/03/2023, 10:29 AM

## 2023-01-03 NOTE — Progress Notes (Signed)
Nutrition Brief Note  Chart reviewed. Pt was on a dysphagia 2 diet with nectar thick liquids, but liberalized to regular today. Pt is full comfort care and family desiring hospice placement. Per TOC notes, hospice MD plans to re-evaluate on Monday and is requesting diet liberalization and calorie count. No further nutrition interventions planned at this time.  Please re-consult as needed.   Levada Schilling, RD, LDN, CDCES Registered Dietitian II Certified Diabetes Care and Education Specialist Please refer to [facility] for RD and/or RD on-call/weekend/after hours pager

## 2023-01-03 NOTE — Progress Notes (Signed)
PROGRESS NOTE   [name]  TGG:[id number] DOB: 29-Aug-1935 DOA: 01/14/2023 PCP: Benita Stabile, MD   Chief Complaint  Patient presents with   Failure To Thrive   Level of care: Med-Surg  Brief Admission History:   88 y.o. male, e with extensive cancer history significant for mantle cell lymphoma (Dx 08/2013 S/P chemotherapy, follows with Dr. Ellin Saba), prostate cancer (S/P laparoscopic prostatectomy 02/2008 followed by radiation therapy), and recent diagnosis of poorly differentiated adenocarcinoma of the lung 10/2022 metastatic to bone  Patient additionally has a history of hypertension, OSA, B12 deficiency, history of CAD, GERD, CKD stage IIIa, hyperlipidemia, peripheral vascular disease, insulin-dependent type 2 diabetes, and artificial urethral sphincter placement at [facility] 05/2022 (follows with Dr. Rudell Cobb at [facility]).    -Patient is altered, most of the history was obtained by wife at bedside, and daughter by phone, patient had PCP visit today, where he was noted to be somnolent, withdrawn, so he was sent by by his PCP for further evaluation, patient his first course of immunotherapy, he was scheduled for an excision tomorrow, but he is somnolent, with progressive weakness, very poor intake and appetite, no fall, no fever, no chills -In ED his workup was significant for low potassium at 3.3, hyponatremia at 134, baseline creatinine of 1.4, known anemia with hemoglobin of 7.5, MRI brain was obtained which was unfortunately significant for new brain metastasis 8 mm brain metastasis with surrounding edema, [facility] hospitalist consulted to admit.   Assessment and Plan:  Lung adenocarcinoma, with metastasis to bone, adrenal glands, and new mets to brain. -discussed at length with wife at bedside, and daughter Bonita Quin, at this point family wishes to proceed with hospice, no aggressive measures, no heroics - palliative  medicine consulted, their input greatly appreciated. - transitioned to  full comfort measures and requested for inpatient hospice referral  Diabetes mellitus, type II, uncontrolled with steroid induced hyperglycemia -A1c was 7.2 in February of this year -he is being covered with sliding scale insulin  -now transitioned to full comfort measures  Chronic kidney disease, stage 3a  -Renal function was at baseline when last tested   Essential hypertension -full comfort measures now    Mixed diabetic hyperlipidemia associated with type 2 diabetes mellitus  -Statin on hold given his encephalopathy  -now transitioned to full comfort measures -continue semglee 20 units daily and CBG monitoring and SSI coverage for now to prevent very high BS readings as family reports high BS causes agitation and discomfort for patient   GERD without esophagitis -now transitioned to full comfort measures   Hypokalemia - Repleted    Anemia of chronic kidney disease -now transitioned to full comfort measures   Goals of care: -now transitioned to full comfort measures  DVT prophylaxis: full comfort measures  Code Status: DNR  Family Communication: bedside 4/11, 4/12     Consultants:  Palliative care service   Procedures:   Antimicrobials:    Subjective: Pt has no specific complaint at this time appears comfortable, does not appear in distress.    Objective: Vitals:   01/02/23 0513 01/02/23 0834 01/02/23 0839 01/02/23 2024  BP: 107/67  105/72 110/69  Pulse: 95  92 (!) 101  Resp: (!) 24   18  Temp: 98.4 F (36.9 C)   98.4 F (36.9 C)  TempSrc:    Oral  SpO2: 95% 96% 99% 100%  Weight:      Height:        Intake/Output Summary (Last 24 hours) at  01/03/2023 1607 Last data filed at 01/03/2023 1139 Gross per 24 hour  Intake --  Output 1750 ml  Net -1750 ml   Filed Weights   01/06/2023 2040  Weight: 100.7 kg   Examination:  General exam: pt appears somnolent, nonverbal; not acutely distressed appearing.  Respiratory system: no increased work of breathing  noticed. Cardiovascular system: normal S1 & S2 heard. No JVD, murmurs, rubs, gallops or clicks. No pedal edema. Gastrointestinal system: Abdomen is nondistended, soft and nontender. No organomegaly or masses felt. Normal bowel sounds heard. Central nervous system: somnolent. Moving extremities spontaneously. Extremities: no gross edema. Skin: age related changes only. Psychiatry: Judgement and insight appear UTD. Mood & affect UTD.   Data Reviewed: I have personally reviewed following labs and imaging studies  CBC: Recent Labs  Lab 12/30/2022 1316  WBC 5.3  NEUTROABS 4.1  HGB 7.5*  HCT 24.0*  MCV 92.7  PLT 155    Basic Metabolic Panel: Recent Labs  Lab 01/12/2023 1316  NA 134*  K 3.3*  CL 98  CO2 24  GLUCOSE 275*  BUN 49*  CREATININE 1.44*  CALCIUM 9.0  MG 1.7    CBG: Recent Labs  Lab 01/02/23 0734 01/02/23 1130 01/02/23 1658 01/03/23 0601 01/03/23 1116  GLUCAP 289* 281* 352* 248* 240*    No results found for this or any previous visit (from the past 240 hour(s)).   Radiology Studies: No results found.  Scheduled Meds:   HYDROmorphone (DILAUDID) injection  0.5 mg Intravenous Q6H   insulin aspart  0-15 Units Subcutaneous TID WC   insulin glargine-yfgn  20 Units Subcutaneous Daily   Continuous Infusions:  levETIRAcetam 500 mg (01/03/23 0657)     LOS: 2 days   Time spent: 35 mins  Madalina Rosman Laural Benes, MD How to contact the [facility] Attending or Consulting provider 7A - 7P or covering provider during after hours 7P -7A, for this patient?  Check the care team in [facility] and look for a) attending/consulting TRH provider listed and b) the [facility] team listed Log into [email or web address] and use [place]'s universal password to access. If you do not have the password, please contact the hospital operator. Locate the [facility] provider you are looking for under [facility] and page to a number that you can be directly reached. If you still have difficulty reaching the  provider, please page the [facility] (Director on Call) for the Hospitalists listed on amion for assistance.  01/03/2023, 4:07 PM

## 2023-01-03 NOTE — TOC Progression Note (Signed)
Transition of Care [facility]) - Progression Note    Patient Details  Name: [name] MRN: [id number] Date of Birth: 02-Oct-1934  Transition of Care [place]) CM/SW Contact  Annice Needy, LCSW Phone Number: 01/03/2023, 2:29 PM  Clinical Narrative:    full comfort: Hospice MD wants patient reevaluated on Monday. [facility] staff to conduct calorie count and change diet from pureed. Hospice nurse will reevaluate Monday between 9-9:30.   Expected Discharge Plan: Hospice Medical Facility Barriers to Discharge: Continued Medical Work up  Expected Discharge Plan and Services                                               Social Determinants of Health (SDOH) Interventions SDOH Screenings   Food Insecurity: No Food Insecurity (01/04/2023)  Housing: Low Risk  (01/11/2023)  Transportation Needs: No Transportation Needs (12/31/2022)  Utilities: Not At Risk (01/11/2023)  Tobacco Use: Low Risk  (01/08/2023)    Readmission Risk Interventions    08/30/2021   11:52 AM 06/12/2021   12:39 PM 06/12/2021   10:43 AM  Readmission Risk Prevention Plan  Transportation Screening Complete Complete   PCP or Specialist Appt within 3-5 Days Complete  Complete  HRI or Home Care Consult Complete  Complete  Social Work Consult for Recovery Care Planning/Counseling Complete  Complete  Palliative Care Screening Not Applicable  Not Applicable  Medication Review Oceanographer) Complete  Complete

## 2023-01-03 NOTE — Progress Notes (Signed)
NT in to bathe pt and change bed linens. Wife, daughter and granddaughter in room with pt. While changing bed linens, NT found bed bug in linens. Bug placed in specimen cup, still alive and moving. No other bugs seen on pt or in linens. Pt, wife, daughter and granddaughter all notified of finding and advised to check their homes, beds, bedding and clothing for presence of bed bugs. All stated understanding. MD, [facility] and [facility] all notified of finding. Pt placed on contact precautions.

## 2023-01-03 NOTE — Progress Notes (Signed)
   01/03/23 1452  Spiritual Encounters  Type of Visit Follow up  Care provided to: [facility] partners present during encounter Nurse  Referral source Family  Reason for visit End-of-life  OnCall Visit No  Spiritual Framework  Presenting Themes Meaning/purpose/sources of inspiration;Courage hope and growth;Significant life change  Community/Connection Family  Patient Stress Factors None identified  Family Stress Factors Lack of caregivers;Loss;Major life changes  Interventions  Spiritual Care Interventions Made Compassionate presence;Reflective listening;Narrative/life review;Encouragement;Supported grief process  Intervention Outcomes  Outcomes Connection to spiritual care;Awareness of support;Patient family open to resources  Spiritual Care Plan  Spiritual Care Issues Still Outstanding Lunette Stands will continue to follow   Found patient being tended to by care team today. Patient family outside of room and welcomed Chaplain warmly. Chaplain engaged them in reflection around next steps regarding hospice care and they had questions about what this may look like if Mr. Verzosa were to return home. Chaplain answered their questions and also made copies of HCPOA paperwork and placed it in his chart and updated EPIC to reflect this.  Provided prayer and opportunity for life review today for wife, Lupita Leash. Will remain available in order to provide spiritual support and to assess for spiritual need.   Rev. Jolyn Lent, M.Div Chaplain

## 2023-01-04 DIAGNOSIS — R627 Adult failure to thrive: Secondary | ICD-10-CM | POA: Diagnosis not present

## 2023-01-04 DIAGNOSIS — I10 Essential (primary) hypertension: Secondary | ICD-10-CM | POA: Diagnosis not present

## 2023-01-04 DIAGNOSIS — R6251 Failure to thrive (child): Secondary | ICD-10-CM | POA: Diagnosis not present

## 2023-01-04 DIAGNOSIS — I2510 Atherosclerotic heart disease of native coronary artery without angina pectoris: Secondary | ICD-10-CM | POA: Diagnosis not present

## 2023-01-04 LAB — GLUCOSE, CAPILLARY
Glucose-Capillary: 69 mg/dL — ABNORMAL LOW (ref 70–99)
Glucose-Capillary: 141 mg/dL — ABNORMAL HIGH (ref 70–99)
Glucose-Capillary: 138 mg/dL — ABNORMAL HIGH (ref 70–99)
Glucose-Capillary: 128 mg/dL — ABNORMAL HIGH (ref 70–99)
Glucose-Capillary: 135 mg/dL — ABNORMAL HIGH (ref 70–99)

## 2023-01-04 NOTE — Progress Notes (Signed)
PROGRESS NOTE  [name]  FBP:[id number] DOB: 1934/10/23 DOA: 12/25/2022 PCP: Benita Stabile, MD   Chief Complaint  Patient presents with   Failure To Thrive   Level of care: Med-Surg  Brief Admission History:   88 y.o. male, e with extensive cancer history significant for mantle cell lymphoma (Dx 08/2013 S/P chemotherapy, follows with Dr. Ellin Saba), prostate cancer (S/P laparoscopic prostatectomy 02/2008 followed by radiation therapy), and recent diagnosis of poorly differentiated adenocarcinoma of the lung 10/2022 metastatic to bone  Patient additionally has a history of hypertension, OSA, B12 deficiency, history of CAD, GERD, CKD stage IIIa, hyperlipidemia, peripheral vascular disease, insulin-dependent type 2 diabetes, and artificial urethral sphincter placement at [facility] 05/2022 (follows with Dr. Rudell Cobb at [facility]).    -Patient is altered, most of the history was obtained by wife at bedside, and daughter by phone, patient had PCP visit today, where he was noted to be somnolent, withdrawn, so he was sent by by his PCP for further evaluation, patient his first course of immunotherapy, he was scheduled for an excision tomorrow, but he is somnolent, with progressive weakness, very poor intake and appetite, no fall, no fever, no chills -In ED his workup was significant for low potassium at 3.3, hyponatremia at 134, baseline creatinine of 1.4, known anemia with hemoglobin of 7.5, MRI brain was obtained which was unfortunately significant for new brain metastasis 8 mm brain metastasis with surrounding edema, [facility] hospitalist consulted to admit.   Assessment and Plan:  Lung adenocarcinoma, with metastasis to bone, adrenal glands, and new mets to brain. -discussed at length with wife at bedside, and daughter Bonita Quin, at this point family wishes to proceed with hospice, no aggressive measures, no heroics - palliative  medicine consulted, their input greatly appreciated. - transitioned to  full comfort measures and requested for inpatient hospice referral  Diabetes mellitus, type II, uncontrolled with steroid induced hyperglycemia -A1c was 7.2 in February of this year -he is being covered with sliding scale insulin, DC basal insulin as he is not eating much now -now transitioned to full comfort measures CBG (last 3)  Recent Labs    01/04/23 0749 01/04/23 1027 01/04/23 1126  GLUCAP 69* 141* 138*     Chronic kidney disease, stage 3a  -Renal function was at baseline when last tested   Essential hypertension -full comfort measures now    Mixed diabetic hyperlipidemia associated with type 2 diabetes mellitus  -Statin on hold given his encephalopathy  -now transitioned to full comfort measures -stopped semglee 20 units daily with poor oral intake and low BS and CBG monitoring and SSI coverage for now to prevent very high BS readings as family reports high BS causes agitation and discomfort for patient CBG (last 3)  Recent Labs    01/04/23 0749 01/04/23 1027 01/04/23 1126  GLUCAP 69* 141* 138*      GERD without esophagitis -now transitioned to full comfort measures   Hypokalemia - Repleted    Anemia of chronic kidney disease -now transitioned to full comfort measures   Goals of care: -now transitioned to full comfort measures  DVT prophylaxis: full comfort measures  Code Status: DNR  Family Communication: bedside 4/11, 4/12     Consultants:  Palliative care service   Procedures:   Antimicrobials:    Subjective: Pt does not appear in distress.    Objective: Vitals:   01/02/23 0834 01/02/23 0839 01/02/23 2024 01/04/23 0416  BP:  105/72 110/69 (!) 160/90  Pulse:  92 (!) 101 (!)  124  Resp:   18 16  Temp:   98.4 F (36.9 C) 98.2 F (36.8 C)  TempSrc:   Oral   SpO2: 96% 99% 100% 95%  Weight:      Height:        Intake/Output Summary (Last 24 hours) at 01/04/2023 1316 Last data filed at 01/04/2023 1100 Gross per 24 hour  Intake 540 ml   Output 500 ml  Net 40 ml   Filed Weights   01/20/2023 2040  Weight: 100.7 kg   Examination:  General exam: pt appears somnolent, nonverbal; not acutely distressed appearing.  Respiratory system: no increased work of breathing noticed. Cardiovascular system: normal S1 & S2 heard. No JVD, murmurs, rubs, gallops or clicks. No pedal edema. Gastrointestinal system: Abdomen is nondistended, soft and nontender. No organomegaly or masses felt. Normal bowel sounds heard. Central nervous system: somnolent. Moving extremities spontaneously. Extremities: no gross edema. Skin: age related changes only. Psychiatry: Judgement and insight appear UTD. Mood & affect UTD.   Data Reviewed: I have personally reviewed following labs and imaging studies  CBC: Recent Labs  Lab 01/16/2023 1316  WBC 5.3  NEUTROABS 4.1  HGB 7.5*  HCT 24.0*  MCV 92.7  PLT 155    Basic Metabolic Panel: Recent Labs  Lab 12/23/2022 1316  NA 134*  K 3.3*  CL 98  CO2 24  GLUCOSE 275*  BUN 49*  CREATININE 1.44*  CALCIUM 9.0  MG 1.7    CBG: Recent Labs  Lab 01/03/23 1116 01/03/23 1612 01/04/23 0749 01/04/23 1027 01/04/23 1126  GLUCAP 240* 179* 69* 141* 138*    No results found for this or any previous visit (from the past 240 hour(s)).   Radiology Studies: No results found.  Scheduled Meds:   HYDROmorphone (DILAUDID) injection  0.5 mg Intravenous Q6H   insulin aspart  0-15 Units Subcutaneous TID WC   Continuous Infusions:  levETIRAcetam 500 mg (01/04/23 0528)     LOS: 3 days   Time spent: 35 mins  Geordie Nooney Laural Benes, MD How to contact the [facility] Attending or Consulting provider 7A - 7P or covering provider during after hours 7P -7A, for this patient?  Check the care team in [facility] and look for a) attending/consulting [facility] provider listed and b) the [facility] team listed Log into [email or web address] and use [place] universal password to access. If you do not have the password, please contact the hospital  operator. Locate the [facility] provider you are looking for under [facility] and page to a number that you can be directly reached. If you still have difficulty reaching the provider, please page the [facility] (Director on Call) for the Hospitalists listed on amion for assistance.  01/04/2023, 1:16 PM

## 2023-01-05 DIAGNOSIS — R627 Adult failure to thrive: Secondary | ICD-10-CM | POA: Diagnosis not present

## 2023-01-05 DIAGNOSIS — R6251 Failure to thrive (child): Secondary | ICD-10-CM

## 2023-01-05 DIAGNOSIS — I10 Essential (primary) hypertension: Secondary | ICD-10-CM | POA: Diagnosis not present

## 2023-01-05 DIAGNOSIS — C7931 Secondary malignant neoplasm of brain: Secondary | ICD-10-CM

## 2023-01-05 DIAGNOSIS — I2510 Atherosclerotic heart disease of native coronary artery without angina pectoris: Secondary | ICD-10-CM | POA: Diagnosis not present

## 2023-01-05 DIAGNOSIS — C3490 Malignant neoplasm of unspecified part of unspecified bronchus or lung: Secondary | ICD-10-CM

## 2023-01-05 LAB — GLUCOSE, CAPILLARY
Glucose-Capillary: 113 mg/dL — ABNORMAL HIGH (ref 70–99)
Glucose-Capillary: 206 mg/dL — ABNORMAL HIGH (ref 70–99)
Glucose-Capillary: 201 mg/dL — ABNORMAL HIGH (ref 70–99)

## 2023-01-05 MED ORDER — LORAZEPAM 1 MG PO TABS: 1.0000 mg | ORAL_TABLET | ORAL | Status: DC | PRN

## 2023-01-05 MED ORDER — LORAZEPAM 2 MG/ML PO CONC
1.0000 mg | ORAL | Status: DC | PRN
  Filled 2023-01-05: qty 0.5

## 2023-01-05 MED ORDER — LORAZEPAM 2 MG/ML IJ SOLN: 1.0000 mg | INTRAMUSCULAR | Status: DC | PRN

## 2023-01-06 ENCOUNTER — Inpatient Hospital Stay: Payer: Medicare Other | Admitting: Dietician

## 2023-01-06 DIAGNOSIS — C3490 Malignant neoplasm of unspecified part of unspecified bronchus or lung: Principal | ICD-10-CM

## 2023-01-09 ENCOUNTER — Ambulatory Visit: Payer: Medicare Other

## 2023-01-09 ENCOUNTER — Other Ambulatory Visit: Payer: Medicare Other

## 2023-01-09 ENCOUNTER — Ambulatory Visit: Payer: Medicare Other | Admitting: Hematology

## 2023-01-09 ENCOUNTER — Ambulatory Visit (INDEPENDENT_AMBULATORY_CARE_PROVIDER_SITE_OTHER): Payer: Medicare Other | Admitting: Gastroenterology

## 2023-01-09 DIAGNOSIS — E1165 Type 2 diabetes mellitus with hyperglycemia: Secondary | ICD-10-CM | POA: Diagnosis not present

## 2023-01-15 ENCOUNTER — Other Ambulatory Visit: Payer: Medicare Other

## 2023-01-15 ENCOUNTER — Ambulatory Visit: Payer: Medicare Other | Admitting: Hematology

## 2023-01-15 ENCOUNTER — Ambulatory Visit: Payer: Medicare Other

## 2023-01-16 ENCOUNTER — Ambulatory Visit (INDEPENDENT_AMBULATORY_CARE_PROVIDER_SITE_OTHER): Payer: Medicare Other | Admitting: Gastroenterology

## 2023-01-22 NOTE — Progress Notes (Signed)
Excessive secretions noted and some coughing from patient. Suctioned patient. Facial grimacing when being touched. PRN Ativan, Dilaudid and robinul given. Pillows position under his arms d/t swelling.

## 2023-01-22 NOTE — Progress Notes (Signed)
Time of death 15. Two nurse verification. No breath sounds heard for one minute. Notified MD

## 2023-01-22 NOTE — Death Summary Note (Signed)
DEATH SUMMARY   Patient Details  Name: [name] MRN: [id number] DOB: 04-Feb-1935  Admission/Discharge Information   Admit Date:  01-04-2023  Date of Death: Date of Death: 2023/01/08  Time of Death: Time of Death: 1910  Length of Stay: 4  Referring Physician: Benita Stabile, MD   Reason(s) for Hospitalization  88 y.o. male, e with extensive cancer history significant for mantle cell lymphoma (Dx 08/2013 S/P chemotherapy, follows with Dr. Ellin Saba), prostate cancer (S/P laparoscopic prostatectomy 02/2008 followed by radiation therapy), and recent diagnosis of poorly differentiated adenocarcinoma of the lung 10/2022 metastatic to bone  Patient additionally has a history of hypertension, OSA, B12 deficiency, history of CAD, GERD, CKD stage IIIa, hyperlipidemia, peripheral vascular disease, insulin-dependent type 2 diabetes, and artificial urethral sphincter placement at [facility] 05/2022 (follows with Dr. Rudell Cobb at [facility]).    -Patient is altered, most of the history was obtained by wife at bedside, and daughter by phone, patient had PCP visit today, where he was noted to be somnolent, withdrawn, so he was sent by by his PCP for further evaluation, patient his first course of immunotherapy, he was scheduled for an excision tomorrow, but he is somnolent, with progressive weakness, very poor intake and appetite, no fall, no fever, no chills -In ED his workup was significant for low potassium at 3.3, hyponatremia at 134, baseline creatinine of 1.4, known anemia with hemoglobin of 7.5, MRI brain was obtained which was unfortunately significant for new brain metastasis 8 mm brain metastasis with surrounding edema, [facility] hospitalist consulted to admit.  Diagnoses  Preliminary cause of death: metastatic lung carcinoma  Secondary Diagnoses (including complications and co-morbidities):  Active Problems:   Essential hypertension   CAD (coronary artery disease)   Acute metabolic encephalopathy    Primary lung adenocarcinoma, left   Failure to thrive in adult   Comfort measures only status   Brief Hospital Course (including significant findings, care, treatment, and services provided and events leading to death)  Lung adenocarcinoma, with metastasis to bone, adrenal glands, and new mets to brain. -discussed at length with wife at bedside, and daughter Bonita Quin, at this point family wishes to proceed with hospice, no aggressive measures, no heroics - palliative  medicine consulted, their input greatly appreciated. - transitioned to full comfort measures and requested for inpatient hospice referral   Diabetes mellitus, type II, uncontrolled with steroid induced hyperglycemia -A1c was 7.2 in February of this year -he was initially covered with sliding scale insulin, DC basal insulin as he is not eating much now -now transitioned to full comfort measures  Chronic kidney disease, stage 3a  -Renal function was at baseline when last tested   Essential hypertension -full comfort measures now    Mixed diabetic hyperlipidemia associated with type 2 diabetes mellitus  -Statin on hold given his encephalopathy  -now transitioned to full comfort measures -stopped semglee 20 units daily with poor oral intake    GERD without esophagitis -now transitioned to full comfort measures   Hypokalemia - Repleted    Anemia of chronic kidney disease -now transitioned to full comfort measures   Goals of care: -now transitioned to full comfort measures  Pertinent Labs and Studies  Significant Diagnostic Studies MR BRAIN W WO CONTRAST  Result Date: 2023/01/04 CLINICAL DATA:  Mental status change, unknown cause Tech note: History of colon AND prostate cancer. EXAM: MRI HEAD WITHOUT AND WITH CONTRAST TECHNIQUE: Multiplanar, multiecho pulse sequences of the brain and surrounding structures were obtained without and with intravenous  contrast. CONTRAST:  15mL GADAVIST GADOBUTROL 1 MMOL/ML IV SOLN  COMPARISON:  MRI October 28, 2022. FINDINGS: Brain: Approximately 8 mm peripherally enhancing lesion in the high right parietal lobe, compatible with a metastasis. Surrounding edema without significant mass effect. No evidence of acute infarct, acute hemorrhage, midline shift or hydrocephalus. Vascular: Major arterial flow voids are maintained. Skull and upper cervical spine: Enhancing left sphenoid bone lesion. Sinuses/Orbits: Paranasal sinus mucosal thickening. No acute orbital findings. Other: No mastoid effusions. IMPRESSION: 1. Approximately 8 mm peripherally enhancing lesion in the high right parietal lobe, compatible with a metastasis. Surrounding edema without significant mass effect. 2. Enhancing left sphenoid bone lesion, suspicious for osseous metastasis. Electronically Signed   By: Feliberto Harts M.D.   On: 01/14/2023 14:29   DG Chest Port 1 View  Result Date: 01/10/2023 CLINICAL DATA:  LOC EXAM: PORTABLE CHEST - 1 VIEW COMPARISON:  12/21/2022 FINDINGS: Cardiac silhouette is unremarkable. No pneumothorax or pleural effusion. Mild vascular congestion without focal consolidation. The visualized skeletal structures are unremarkable. IMPRESSION: Vascular congestion without focal consolidation. Electronically Signed   By: Layla Maw M.D.   On: 01/04/2023 13:33   CT Angio Chest PE W and/or Wo Contrast  Result Date: 12/22/2022 CLINICAL DATA:  Concern for pulmonary embolism. Non-small cell lung cancer. EXAM: CT ANGIOGRAPHY CHEST WITH CONTRAST TECHNIQUE: Multidetector CT imaging of the chest was performed using the standard protocol during bolus administration of intravenous contrast. Multiplanar CT image reconstructions and MIPs were obtained to evaluate the vascular anatomy. RADIATION DOSE REDUCTION: This exam was performed according to the departmental dose-optimization program which includes automated exposure control, adjustment of the mA and/or kV according to patient size and/or use of  iterative reconstruction technique. CONTRAST:  26mL OMNIPAQUE IOHEXOL 350 MG/ML SOLN COMPARISON:  CT dated 10/26/2022. FINDINGS: Evaluation of this exam is limited due to respiratory motion artifact. Cardiovascular: There is no cardiomegaly or pericardial effusion. There is moderate atherosclerotic calcification of the thoracic aorta. No aneurysmal dilatation or dissection. The origins of the great vessels of the aortic arch appear patent. Right-sided Port-A-Cath with tip in the central SVC close to the cavoatrial junction. Evaluation of the pulmonary arteries is limited due to respiratory motion. No large or central pulmonary artery embolus identified. Mediastinum/Nodes: Mildly enlarged left hilar and subcarinal lymph nodes. The subcarinal lymph node measures approximately 17 mm in short axis. The esophagus is grossly unremarkable. No mediastinal fluid collection. Lungs/Pleura: No significant interval change in the somewhat spiculated nodule in the superior segment of the left lower lobe. No focal consolidation, pleural effusion, or pneumothorax. The central airways are patent. Upper Abdomen: Small gallstone. Bilateral adrenal lesions, new since the prior CT and measuring up to 3 cm on the right most consistent with metastatic disease. Partially visualized left renal cysts. Musculoskeletal: Osteopenia with degenerative changes of the spine. Pathologic fracture of the right acromion, new since the prior CT. Review of the MIP images confirms the above findings. IMPRESSION: 1. No CT evidence of central pulmonary artery embolus. 2. No significant interval change in the somewhat spiculated nodule in the superior segment of the left lower lobe. 3. Mildly enlarged left hilar and subcarinal lymph nodes. 4. Bilateral adrenal lesions, new since the prior CT and most consistent with metastatic disease. 5. Pathologic fracture of the right acromion, new since the prior CT. 6.  Aortic Atherosclerosis (ICD10-I70.0).  Electronically Signed   By: Elgie Collard M.D.   On: 12/22/2022 00:31   DG Chest 2 View  Result Date: 12/21/2022 CLINICAL  DATA:  Short of breath, history of non-small cell lung cancer EXAM: CHEST - 2 VIEW COMPARISON:  11/07/2022, 11/30/2022 FINDINGS: Frontal and lateral views of the chest demonstrate a stable right chest wall port. Increased density at the left hilum consistent with the metabolically active left hilar adenopathy seen on prior PET scan. The metabolically active left lower lobe nodule seen on recent PET scan is difficult to appreciate by x-ray. No new consolidation, effusion, or pneumothorax. No acute bony abnormalities. IMPRESSION: 1. Left hilar prominence consistent with metabolically active adenopathy seen on recent PET scan. 2. The hypermetabolic left lower lobe pulmonary nodule on recent PET scan is not well visualized by x-ray. 3. No acute airspace disease. Electronically Signed   By: Sharlet Salina M.D.   On: 12/21/2022 22:02    Microbiology No results found for this or any previous visit (from the past 240 hour(s)).  Lab Basic Metabolic Panel: Recent Labs  Lab 01/08/2023 1316  NA 134*  K 3.3*  CL 98  CO2 24  GLUCOSE 275*  BUN 49*  CREATININE 1.44*  CALCIUM 9.0  MG 1.7   Liver Function Tests: Recent Labs  Lab 01/10/2023 1316  AST 26  ALT 27  ALKPHOS 71  BILITOT 0.8  PROT 6.2*  ALBUMIN 2.7*   No results for input(s): "LIPASE", "AMYLASE" in the last 168 hours. No results for input(s): "AMMONIA" in the last 168 hours. CBC: Recent Labs  Lab 12/30/2022 1316  WBC 5.3  NEUTROABS 4.1  HGB 7.5*  HCT 24.0*  MCV 92.7  PLT 155   Cardiac Enzymes: No results for input(s): "CKTOTAL", "CKMB", "CKMBINDEX", "TROPONINI" in the last 168 hours. Sepsis Labs: Recent Labs  Lab 12/30/2022 1316  WBC 5.3  LATICACIDVEN 1.1    Procedures/Operations   Michaelle Bottomley 01/06/2023, 3:39 PM

## 2023-01-22 NOTE — Progress Notes (Signed)
Patient becoming anxious and irritable. Yelling out "I'm Dying I'm Dying I'm Dying, Call the funeral home, help me I'm hurting." PRN Ativan and dilaudid and dilaudid given.

## 2023-01-22 NOTE — Progress Notes (Signed)
The nurse went to give pts his scheduled dilaudid and pts wife asked "can we hold off on that because we have some family coming to see him and I want him to be awake." The patient is currently moaning and groaning and facial grimacing. This nurse educated wife that these are signs of pain that he needs his medication and she still insisted that we hold off. She stated "I will come get you once he gets worse."

## 2023-01-22 NOTE — Progress Notes (Signed)
PROGRESS NOTE  [name]  PIR:[id number] DOB: 1935-03-16 DOA: 01/11/2023 PCP: Benita Stabile, MD   Chief Complaint  Patient presents with   Failure To Thrive   Level of care: Med-Surg  Brief Admission History:   88 y.o. male, e with extensive cancer history significant for mantle cell lymphoma (Dx 08/2013 S/P chemotherapy, follows with Dr. Ellin Saba), prostate cancer (S/P laparoscopic prostatectomy 02/2008 followed by radiation therapy), and recent diagnosis of poorly differentiated adenocarcinoma of the lung 10/2022 metastatic to bone  Patient additionally has a history of hypertension, OSA, B12 deficiency, history of CAD, GERD, CKD stage IIIa, hyperlipidemia, peripheral vascular disease, insulin-dependent type 2 diabetes, and artificial urethral sphincter placement at [facility] 05/2022 (follows with Dr. Rudell Cobb at [facility]).    -Patient is altered, most of the history was obtained by wife at bedside, and daughter by phone, patient had PCP visit today, where he was noted to be somnolent, withdrawn, so he was sent by by his PCP for further evaluation, patient his first course of immunotherapy, he was scheduled for an excision tomorrow, but he is somnolent, with progressive weakness, very poor intake and appetite, no fall, no fever, no chills -In ED his workup was significant for low potassium at 3.3, hyponatremia at 134, baseline creatinine of 1.4, known anemia with hemoglobin of 7.5, MRI brain was obtained which was unfortunately significant for new brain metastasis 8 mm brain metastasis with surrounding edema, [facility] hospitalist consulted to admit.   Assessment and Plan:  Lung adenocarcinoma, with metastasis to bone, adrenal glands, and new mets to brain. -discussed at length with wife at bedside, and daughter Bonita Quin, at this point family wishes to proceed with hospice, no aggressive measures, no heroics - palliative  medicine consulted, their input greatly appreciated. - transitioned to  full comfort measures and requested for inpatient hospice referral  Diabetes mellitus, type II, uncontrolled with steroid induced hyperglycemia -A1c was 7.2 in February of this year -he is being covered with sliding scale insulin, DC basal insulin as he is not eating much now -now transitioned to full comfort measures CBG (last 3)  Recent Labs    01/04/23 2122 12/24/2022 0740 01/17/2023 1120  GLUCAP 135* 113* 206*   Chronic kidney disease, stage 3a  -Renal function was at baseline when last tested   Essential hypertension -full comfort measures now    Mixed diabetic hyperlipidemia associated with type 2 diabetes mellitus  -Statin on hold given his encephalopathy  -now transitioned to full comfort measures -stopped semglee 20 units daily with poor oral intake and low BS and CBG monitoring and SSI coverage for now to prevent very high BS readings as family reports high BS causes agitation and discomfort for patient CBG (last 3)  Recent Labs    01/04/23 2122 01/16/2023 0740 01/08/2023 1120  GLUCAP 135* 113* 206*     GERD without esophagitis -now transitioned to full comfort measures   Hypokalemia - Repleted    Anemia of chronic kidney disease -now transitioned to full comfort measures   Goals of care: -now transitioned to full comfort measures  DVT prophylaxis: full comfort measures  Code Status: DNR  Family Communication: bedside 4/11, 4/12, 4/14    Consultants:  Palliative care service   Procedures:   Antimicrobials:    Subjective: Pt yelling out at times, able to verbalize if he is in pain.     Objective: Vitals:   01/02/23 0839 01/02/23 2024 01/04/23 0416 01/04/23 2125  BP: 105/72 110/69 (!) 160/90 (!) 141/85  Pulse: 92 (!) 101 (!) 124 (!) 105  Resp:  18 16 20   Temp:  98.4 F (36.9 C) 98.2 F (36.8 C) 99.6 F (37.6 C)  TempSrc:  Oral  Oral  SpO2: 99% 100% 95% 93%  Weight:      Height:        Intake/Output Summary (Last 24 hours) at 12/28/2022  1245 Last data filed at 01/20/2023 0900 Gross per 24 hour  Intake 560 ml  Output 500 ml  Net 60 ml   Filed Weights   12/31/2022 2040  Weight: 100.7 kg   Examination:  General exam: pt appears somnolent, nonverbal; not acutely distressed appearing.  Respiratory system: no increased work of breathing noticed. Cardiovascular system: normal S1 & S2 heard. No JVD, murmurs, rubs, gallops or clicks. No pedal edema. Gastrointestinal system: Abdomen is nondistended, soft and nontender. No organomegaly or masses felt. Normal bowel sounds heard. Central nervous system: somnolent. Moving extremities spontaneously. Extremities: no gross edema. Skin: age related changes only. Psychiatry: Judgement and insight appear UTD. Mood & affect UTD.   Data Reviewed: I have personally reviewed following labs and imaging studies  CBC: Recent Labs  Lab 01/10/2023 1316  WBC 5.3  NEUTROABS 4.1  HGB 7.5*  HCT 24.0*  MCV 92.7  PLT 155    Basic Metabolic Panel: Recent Labs  Lab 12/25/2022 1316  NA 134*  K 3.3*  CL 98  CO2 24  GLUCOSE 275*  BUN 49*  CREATININE 1.44*  CALCIUM 9.0  MG 1.7    CBG: Recent Labs  Lab 01/04/23 1126 01/04/23 1705 01/04/23 2122 01/11/2023 0740 01/02/2023 1120  GLUCAP 138* 128* 135* 113* 206*    No results found for this or any previous visit (from the past 240 hour(s)).   Radiology Studies: No results found.  Scheduled Meds:   HYDROmorphone (DILAUDID) injection  0.5 mg Intravenous Q6H   insulin aspart  0-15 Units Subcutaneous TID WC   Continuous Infusions:  levETIRAcetam 500 mg (01/06/2023 0542)     LOS: 4 days   Time spent: 35 mins  Valery Chance Laural Benes, MD How to contact the [facility] Attending or Consulting provider 7A - 7P or covering provider during after hours 7P -7A, for this patient?  Check the care team in [facility] and look for a) attending/consulting [facility] provider listed and b) the [facility] team listed Log into [email or web address] and use [place]'s universal password  to access. If you do not have the password, please contact the hospital operator. Locate the [facility] provider you are looking for under [facility] and page to a number that you can be directly reached. If you still have difficulty reaching the provider, please page the [facility] (Director on Call) for the Hospitalists listed on amion for assistance.  01/12/2023, 12:45 PM

## 2023-01-22 NOTE — Progress Notes (Signed)
Went in full detail of the process of comfort care with daughter [name] and pts spouse and they were appreciative of the the information. Notified MD that the daughter no longer wanted to do his IV Keppra or check his sugars that it can prolong his life and to removed his oxygen. Notified MD and he adjusted the orders. Patient currently resting with eyes closed.

## 2023-01-22 DEATH — deceased

## 2023-01-30 ENCOUNTER — Ambulatory Visit: Payer: Medicare Other | Admitting: Hematology

## 2023-01-30 ENCOUNTER — Ambulatory Visit: Payer: Medicare Other

## 2023-01-30 ENCOUNTER — Other Ambulatory Visit: Payer: Medicare Other

## 2023-02-13 ENCOUNTER — Ambulatory Visit: Payer: Medicare Other | Admitting: Hematology

## 2023-02-13 ENCOUNTER — Other Ambulatory Visit: Payer: Medicare Other

## 2023-02-13 ENCOUNTER — Ambulatory Visit: Payer: Medicare Other

## 2023-02-27 ENCOUNTER — Ambulatory Visit: Payer: Medicare Other | Admitting: Hematology

## 2023-02-27 ENCOUNTER — Other Ambulatory Visit: Payer: Medicare Other

## 2023-02-27 ENCOUNTER — Ambulatory Visit: Payer: Medicare Other
